# Patient Record
Sex: Male | Born: 1950 | Race: White | Hispanic: No | Marital: Married | Smoking: Former smoker
Health system: Southern US, Community
[De-identification: ages and names within clinical notes are randomized; demographics above are authoritative.]

## PROBLEM LIST (undated history)

## (undated) ENCOUNTER — Inpatient Hospital Stay: Admission: EM | Payer: Self-pay | Source: Home / Self Care

## (undated) DIAGNOSIS — I219 Acute myocardial infarction, unspecified: Secondary | ICD-10-CM

## (undated) DIAGNOSIS — I2510 Atherosclerotic heart disease of native coronary artery without angina pectoris: Secondary | ICD-10-CM

## (undated) DIAGNOSIS — Z9484 Stem cells transplant status: Secondary | ICD-10-CM

## (undated) DIAGNOSIS — K219 Gastro-esophageal reflux disease without esophagitis: Secondary | ICD-10-CM

## (undated) DIAGNOSIS — Z923 Personal history of irradiation: Secondary | ICD-10-CM

## (undated) DIAGNOSIS — I7090 Unspecified atherosclerosis: Secondary | ICD-10-CM

## (undated) DIAGNOSIS — E785 Hyperlipidemia, unspecified: Secondary | ICD-10-CM

## (undated) DIAGNOSIS — I10 Essential (primary) hypertension: Secondary | ICD-10-CM

## (undated) DIAGNOSIS — Z8701 Personal history of pneumonia (recurrent): Secondary | ICD-10-CM

## (undated) HISTORY — PX: TONSILLECTOMY: SUR1361

## (undated) HISTORY — PX: CARDIAC CATHETERIZATION: SHX172

---

## 2014-07-04 DIAGNOSIS — R1310 Dysphagia, unspecified: Secondary | ICD-10-CM | POA: Insufficient documentation

## 2014-07-04 DIAGNOSIS — S86119A Strain of other muscle(s) and tendon(s) of posterior muscle group at lower leg level, unspecified leg, initial encounter: Secondary | ICD-10-CM | POA: Insufficient documentation

## 2014-07-04 DIAGNOSIS — W57XXXA Bitten or stung by nonvenomous insect and other nonvenomous arthropods, initial encounter: Secondary | ICD-10-CM | POA: Insufficient documentation

## 2014-07-04 DIAGNOSIS — R7309 Other abnormal glucose: Secondary | ICD-10-CM | POA: Insufficient documentation

## 2014-07-04 DIAGNOSIS — E785 Hyperlipidemia, unspecified: Secondary | ICD-10-CM | POA: Insufficient documentation

## 2014-07-04 DIAGNOSIS — I2510 Atherosclerotic heart disease of native coronary artery without angina pectoris: Secondary | ICD-10-CM | POA: Insufficient documentation

## 2014-07-04 DIAGNOSIS — M7710 Lateral epicondylitis, unspecified elbow: Secondary | ICD-10-CM | POA: Insufficient documentation

## 2016-05-17 DIAGNOSIS — M19019 Primary osteoarthritis, unspecified shoulder: Secondary | ICD-10-CM | POA: Insufficient documentation

## 2016-05-17 DIAGNOSIS — M25819 Other specified joint disorders, unspecified shoulder: Secondary | ICD-10-CM | POA: Insufficient documentation

## 2017-07-11 DIAGNOSIS — M67911 Unspecified disorder of synovium and tendon, right shoulder: Secondary | ICD-10-CM | POA: Insufficient documentation

## 2020-06-08 DIAGNOSIS — M4802 Spinal stenosis, cervical region: Secondary | ICD-10-CM | POA: Insufficient documentation

## 2020-06-09 ENCOUNTER — Other Ambulatory Visit: Payer: Self-pay | Admitting: Neurosurgery

## 2020-06-10 ENCOUNTER — Other Ambulatory Visit: Payer: Self-pay | Admitting: Neurosurgery

## 2020-06-12 NOTE — Progress Notes (Signed)
[place] - [address] [place] [address] Phone: [phone] Fax: [phone]      Your procedure is scheduled on Wednesday, August 18th.  Report to [facility]tr[facility] "A" at 1:30 P.M., and check in at the Admitting office.  Call this number if you have problems the morning of surgery:  [phone]  Call [phone] if you have any questions prior to your surgery date Monday-Friday 8am-4pm    Remember:  Do not eat after midnight the night before your surgery  You may drink clear liquids until 12:30 the afternoon of your surgery.   Clear liquids allowed are: Water, Non-Citrus Juices (without pulp), Carbonated Beverages, Clear Tea, Black Coffee Only, and Gatorade    Take these medicines the morning of surgery with A SIP OF WATER   Atorvastatin (Lipitor)  Metoprolol   Nitroglycerin - if needed  Pregabalin (Lyrica)  Follow your surgeon's instructions on when to stop Prasugrel (Effient) & Aspirin.  If no instructions were given by your surgeon then you will need to call the office to get those instructions.    As of today, STOP taking Aleve, Naproxen, Ibuprofen, Motrin, Advil, Goody's, BC's, all herbal medications, fish oil, and all vitamins.                      Do not wear jewelry            Do not wear lotions, powders, colognes, or deodorant.            Men may shave face and neck.            Do not bring valuables to the hospital.            [facility] is not responsible for any belongings or valuables.  Do NOT Smoke (Tobacco/Vaping) or drink Alcohol 24 hours prior to your procedure If you use a CPAP at night, you may bring all equipment for your overnight stay.   Contacts, glasses, dentures or bridgework may not be worn into surgery.      For patients admitted to the hospital, discharge time will be determined by your treatment team.   Patients discharged the day of surgery will not be allowed to drive  home, and someone needs to stay with them for 24 hours.    Special instructions:   [facility]- Preparing For Surgery  Before surgery, you can play an important role. Because skin is not sterile, your skin needs to be as free of germs as possible. You can reduce the number of germs on your skin by washing with CHG (chlorahexidine gluconate) Soap before surgery.  CHG is an antiseptic cleaner which kills germs and bonds with the skin to continue killing germs even after washing.    Oral Hygiene is also important to reduce your risk of infection.  Remember - BRUSH YOUR TEETH THE MORNING OF SURGERY WITH YOUR REGULAR TOOTHPASTE  Please do not use if you have an allergy to CHG or antibacterial soaps. If your skin becomes reddened/irritated stop using the CHG.  Do not shave (including legs and underarms) for at least 48 hours prior to first CHG shower. It is OK to shave your face.  Please follow these instructions carefully.   1. Shower the NIGHT BEFORE SURGERY and the MORNING OF SURGERY with CHG Soap.   2. If you chose to wash your hair, wash your hair first as usual with your  normal shampoo.  3. After you shampoo, rinse your hair and body thoroughly to remove the shampoo.  4. Use CHG as you would any other liquid soap. You can apply CHG directly to the skin and wash gently with a scrungie or a clean washcloth.   5. Apply the CHG Soap to your body ONLY FROM THE NECK DOWN.  Do not use on open wounds or open sores. Avoid contact with your eyes, ears, mouth and genitals (private parts). Wash Face and genitals (private parts)  with your normal soap.   6. Wash thoroughly, paying special attention to the area where your surgery will be performed.  7. Thoroughly rinse your body with warm water from the neck down.  8. DO NOT shower/wash with your normal soap after using and rinsing off the CHG Soap.  9. Pat yourself dry with a CLEAN TOWEL.  10. Wear CLEAN PAJAMAS to bed the night before  surgery  11. Place CLEAN SHEETS on your bed the night of your first shower and DO NOT SLEEP WITH PETS.   Day of Surgery: Wear Clean/Comfortable clothing the morning of surgery Do not apply any deodorants/lotions.   Remember to brush your teeth WITH YOUR REGULAR TOOTHPASTE.   Please read over the following fact sheets that you were given.

## 2020-06-15 ENCOUNTER — Other Ambulatory Visit: Payer: Self-pay

## 2020-06-15 ENCOUNTER — Other Ambulatory Visit (HOSPITAL_COMMUNITY)
Admission: RE | Admit: 2020-06-15 | Discharge: 2020-06-15 | Disposition: A | Discharge status: Home / Self Care | Payer: BC Managed Care – PPO | Source: Ambulatory Visit | Attending: Neurosurgery | Admitting: Neurosurgery

## 2020-06-15 ENCOUNTER — Encounter (HOSPITAL_COMMUNITY): Payer: Self-pay

## 2020-06-15 ENCOUNTER — Encounter (HOSPITAL_COMMUNITY)
Admission: RE | Admit: 2020-06-15 | Discharge: 2020-06-15 | Disposition: A | Discharge status: Home / Self Care | Payer: BC Managed Care – PPO | Source: Ambulatory Visit | Attending: Neurosurgery | Admitting: Neurosurgery

## 2020-06-15 DIAGNOSIS — Z20822 Contact with and (suspected) exposure to covid-19: Secondary | ICD-10-CM | POA: Insufficient documentation

## 2020-06-15 DIAGNOSIS — I252 Old myocardial infarction: Secondary | ICD-10-CM | POA: Diagnosis not present

## 2020-06-15 DIAGNOSIS — M4722 Other spondylosis with radiculopathy, cervical region: Secondary | ICD-10-CM | POA: Diagnosis present

## 2020-06-15 DIAGNOSIS — Z87891 Personal history of nicotine dependence: Secondary | ICD-10-CM | POA: Diagnosis not present

## 2020-06-15 DIAGNOSIS — M4712 Other spondylosis with myelopathy, cervical region: Secondary | ICD-10-CM | POA: Diagnosis present

## 2020-06-15 DIAGNOSIS — M50022 Cervical disc disorder at C5-C6 level with myelopathy: Secondary | ICD-10-CM | POA: Diagnosis not present

## 2020-06-15 DIAGNOSIS — R001 Bradycardia, unspecified: Secondary | ICD-10-CM | POA: Insufficient documentation

## 2020-06-15 DIAGNOSIS — Z6835 Body mass index (BMI) 35.0-35.9, adult: Secondary | ICD-10-CM | POA: Diagnosis not present

## 2020-06-15 DIAGNOSIS — Z7982 Long term (current) use of aspirin: Secondary | ICD-10-CM | POA: Diagnosis not present

## 2020-06-15 DIAGNOSIS — Z01810 Encounter for preprocedural cardiovascular examination: Secondary | ICD-10-CM | POA: Insufficient documentation

## 2020-06-15 DIAGNOSIS — E669 Obesity, unspecified: Secondary | ICD-10-CM | POA: Diagnosis not present

## 2020-06-15 DIAGNOSIS — Z79899 Other long term (current) drug therapy: Secondary | ICD-10-CM | POA: Diagnosis not present

## 2020-06-15 DIAGNOSIS — M50122 Cervical disc disorder at C5-C6 level with radiculopathy: Secondary | ICD-10-CM | POA: Diagnosis not present

## 2020-06-15 DIAGNOSIS — M4802 Spinal stenosis, cervical region: Secondary | ICD-10-CM | POA: Diagnosis not present

## 2020-06-15 DIAGNOSIS — Z01812 Encounter for preprocedural laboratory examination: Secondary | ICD-10-CM | POA: Insufficient documentation

## 2020-06-15 DIAGNOSIS — I10 Essential (primary) hypertension: Secondary | ICD-10-CM | POA: Diagnosis not present

## 2020-06-15 DIAGNOSIS — Z7902 Long term (current) use of antithrombotics/antiplatelets: Secondary | ICD-10-CM | POA: Diagnosis not present

## 2020-06-15 DIAGNOSIS — Z955 Presence of coronary angioplasty implant and graft: Secondary | ICD-10-CM | POA: Diagnosis not present

## 2020-06-15 DIAGNOSIS — E785 Hyperlipidemia, unspecified: Secondary | ICD-10-CM | POA: Diagnosis not present

## 2020-06-15 HISTORY — DX: Unspecified atherosclerosis: I70.90

## 2020-06-15 HISTORY — DX: Essential (primary) hypertension: I10

## 2020-06-15 HISTORY — DX: Hyperlipidemia, unspecified: E78.5

## 2020-06-15 HISTORY — DX: Acute myocardial infarction, unspecified: I21.9

## 2020-06-15 LAB — CBC
HCT: 48.3 % (ref 39.0–52.0)
Hemoglobin: 15.4 g/dL (ref 13.0–17.0)
MCH: 28.2 pg (ref 26.0–34.0)
MCHC: 31.9 g/dL (ref 30.0–36.0)
MCV: 88.5 fL (ref 80.0–100.0)
Platelets: 255 10*3/uL (ref 150–400)
RBC: 5.46 MIL/uL (ref 4.22–5.81)
RDW: 13.2 % (ref 11.5–15.5)
WBC: 11.9 10*3/uL — ABNORMAL HIGH (ref 4.0–10.5)
nRBC: 0 % (ref 0.0–0.2)

## 2020-06-15 LAB — TYPE AND SCREEN
ABO/RH(D): O POS
Antibody Screen: NEGATIVE

## 2020-06-15 LAB — SURGICAL PCR SCREEN
MRSA, PCR: POSITIVE — AB
Staphylococcus aureus: POSITIVE — AB

## 2020-06-15 LAB — BASIC METABOLIC PANEL
Anion gap: 9 (ref 5–15)
BUN: 9 mg/dL (ref 8–23)
CO2: 22 mmol/L (ref 22–32)
Calcium: 10.2 mg/dL (ref 8.9–10.3)
Chloride: 109 mmol/L (ref 98–111)
Creatinine, Ser: 1.04 mg/dL (ref 0.61–1.24)
GFR calc Af Amer: >60 mL/min (ref >60)
GFR calc non Af Amer: >60 mL/min (ref >60)
Glucose, Bld: 124 mg/dL — ABNORMAL HIGH (ref 70–99)
Potassium: 4.2 mmol/L (ref 3.5–5.1)
Sodium: 140 mmol/L (ref 135–145)

## 2020-06-15 LAB — SARS CORONAVIRUS 2 (TAT 6-24 HRS): SARS Coronavirus 2: NEGATIVE

## 2020-06-15 NOTE — Progress Notes (Signed)
Patient positive for MRSA and MSSA. Spoke with Wells Guiles at Dr. Arnoldo Morale office- will inform MD of results.

## 2020-06-15 NOTE — Progress Notes (Signed)
PCP - Dr. Gwenyth Allegra Cardiologist - Dominic Pea, PA-C [facility])  Cardiac clearance in chart 06/09/20  Chest x-ray - N/A EKG - 06/15/20 Stress Test - pt states has had in past- records requested ECHO - pt states has had in past- records requested Cardiac Cath - 2000;2017   Sleep Study - denies   Blood Thinner Instructions: pt's last dose of Effient 06/11/20 Aspirin Instructions: last dose 06/11/20; Patient instructed to hold all NSAID's, herbal medications, fish oil and vitamins 7 days prior to surgery.   ERAS Protcol - clear liquids until 1230 PM DOS PRE-SURGERY Ensure or G2- N/A  COVID TEST- 06/15/20 at at [place]. Pt instructed to remain in their car. Educated on Transport planner until [employer].    Anesthesia review: cardiac history- records requested.   Patient denies shortness of breath, fever, cough and chest pain at PAT appointment   All instructions explained to the patient, with a verbal understanding of the material. Patient agrees to go over the instructions while at home for a better understanding. Patient also instructed to self quarantine after being tested for COVID-19. The opportunity to ask questions was provided.

## 2020-06-16 MED ORDER — VANCOMYCIN HCL 1500 MG/300ML IV SOLN
1500.0000 mg | INTRAVENOUS | Status: AC
  Administered 2020-06-17: 1500 mg via INTRAVENOUS
  Filled 2020-06-16: qty 300

## 2020-06-16 NOTE — Progress Notes (Signed)
Anesthesia Chart Review:  Follows with cardiology at [facility] for hx of MI with subsequent stent to LAD and RCA in 2013 and sent to [facility] in 2017 (2017 cath showed stable 50% LAD restenosis, patent RCA), HTN, HLD. Last seen 11/12/19, per note, "He remains stable at this time with no chest pain or other anginal symptoms. We discussed the possibility of discontinuing his Effient considering it has been approximately 4 years since last stent placement. Utilizing shared decision making, he will continue on his current DAPT for now along with atorvastatin, lisinopril, and metoprolol." He was advised to followup in 1 year.   Clearance from PCP dated 06/11/2020 states patient low risk from medical standpoint and will need cardiology clearance.  Cardiac clearance states patient is low to moderate risk and can hold aspirin and Effient starting 8/13.  Copy on chart.  Preop labs reviewed, unremarkable.  EKG 06/15/2020: Sinus bradycardia.  Rate 51.  Cath and PCI 05/09/16 (care everywhere): Diagnostic Summary  RCA and LAD stents patent.  50% in LAD stent narrowing as before.  Ostial Cx 50% as before.  PDA has mid 90% stenosis.  Normal LV function. EF 70%  Diagnostic Recommendations  PCI recommended after discussion with patient.  Interventional Summary  2.25x103mm resolute to PDA.  Interventional Recommendations  Dual anti-platelet therapy with Ticagrelor and Aspirin 81 mg for at least 12  months is recommended.   Wynonia Musty [facility] Short Stay Center/Anesthesiology Phone [phone] 06/16/2020 1:01 PM

## 2020-06-16 NOTE — Anesthesia Preprocedure Evaluation (Addendum)
Anesthesia Evaluation  Patient identified by MRN, date of birth, ID band Patient awake    Reviewed: Allergy & Precautions, NPO status , Patient's Chart, lab work & pertinent test results, reviewed documented beta blocker date and time   Airway Mallampati: III  TM Distance: >3 FB Neck ROM: Full    Dental  (+) Edentulous Lower, Edentulous Upper   Pulmonary former smoker,    Pulmonary exam normal breath sounds clear to auscultation       Cardiovascular hypertension, Pt. on home beta blockers and Pt. on medications + Past MI and + Cardiac Stents (LAD, RCA)  Normal cardiovascular exam Rhythm:Regular Rate:Normal     Neuro/Psych CERVICAL SPONDYLOSIS WITH MYELOPATHY AMD RADICULOPATHY negative psych ROS   GI/Hepatic negative GI ROS, Neg liver ROS,   Endo/Other  Obesity   Renal/GU negative Renal ROS     Musculoskeletal negative musculoskeletal ROS (+)   Abdominal   Peds  Hematology  (+) Blood dyscrasia (Effient), ,   Anesthesia Other Findings Day of surgery medications reviewed with the patient.  Reproductive/Obstetrics                           Anesthesia Physical Anesthesia Plan  ASA: III  Anesthesia Plan: General   Post-op Pain Management:    Induction: Intravenous  PONV Risk Score and Plan: 2 and Dexamethasone and Ondansetron  Airway Management Planned: Oral ETT and Video Laryngoscope Planned  Additional Equipment:   Intra-op Plan:   Post-operative Plan: Extubation in OR  Informed Consent: I have reviewed the patients History and Physical, chart, labs and discussed the procedure including the risks, benefits and alternatives for the proposed anesthesia with the patient or authorized representative who has indicated his/her understanding and acceptance.       Plan Discussed with: CRNA  Anesthesia Plan Comments: (PAT note by Karoline Caldwell, PA-C: Follows with cardiology at [facility]  for hx of MI with subsequent stent to LAD and RCA in 2013 and sent to [facility] in 2017 (2017 cath showed stable 50% LAD restenosis, patent RCA), HTN, HLD. Last seen 11/12/19, per note, "He remains stable at this time with no chest pain or other anginal symptoms. We discussed the possibility of discontinuing his Effient considering it has been approximately 4 years since last stent placement. Utilizing shared decision making, he will continue on his current DAPT for now along with atorvastatin, lisinopril, and metoprolol." He was advised to followup in 1 year.   Clearance from PCP dated 06/11/2020 states patient low risk from medical standpoint and will need cardiology clearance.  Cardiac clearance states patient is low to moderate risk and can hold aspirin and Effient starting 8/13.  Copy on chart.  Preop labs reviewed, unremarkable.  EKG 06/15/2020: Sinus bradycardia.  Rate 51.  Cath and PCI 05/09/16 (care everywhere): Diagnostic Summary  RCA and LAD stents patent.  50% in LAD stent narrowing as before.  Ostial Cx 50% as before.  PDA has mid 90% stenosis.  Normal LV function. EF 70%  Diagnostic Recommendations  PCI recommended after discussion with patient.  Interventional Summary  2.25x70mm resolute to PDA.  Interventional Recommendations  Dual anti-platelet therapy with Ticagrelor and Aspirin 81 mg for at least 12  months is recommended.   )      Anesthesia Quick Evaluation

## 2020-06-17 ENCOUNTER — Encounter (HOSPITAL_COMMUNITY): Payer: Self-pay | Admitting: Neurosurgery

## 2020-06-17 ENCOUNTER — Ambulatory Visit (HOSPITAL_COMMUNITY): Payer: BC Managed Care – PPO | Admitting: Physician Assistant

## 2020-06-17 ENCOUNTER — Other Ambulatory Visit: Payer: Self-pay

## 2020-06-17 ENCOUNTER — Ambulatory Visit (HOSPITAL_COMMUNITY): Payer: BC Managed Care – PPO

## 2020-06-17 ENCOUNTER — Ambulatory Visit (HOSPITAL_COMMUNITY): Payer: BC Managed Care – PPO | Admitting: Anesthesiology

## 2020-06-17 ENCOUNTER — Ambulatory Visit (HOSPITAL_COMMUNITY)
Admission: RE | Admit: 2020-06-17 | Discharge: 2020-06-18 | Disposition: A | Discharge status: Home / Self Care | Payer: BC Managed Care – PPO | Attending: Neurosurgery | Admitting: Neurosurgery

## 2020-06-17 ENCOUNTER — Encounter (HOSPITAL_COMMUNITY)
Admission: RE | Disposition: A | Discharge status: Home / Self Care | Payer: Self-pay | Source: Home / Self Care | Attending: Neurosurgery

## 2020-06-17 DIAGNOSIS — E669 Obesity, unspecified: Secondary | ICD-10-CM | POA: Diagnosis not present

## 2020-06-17 DIAGNOSIS — Z6835 Body mass index (BMI) 35.0-35.9, adult: Secondary | ICD-10-CM | POA: Insufficient documentation

## 2020-06-17 DIAGNOSIS — I252 Old myocardial infarction: Secondary | ICD-10-CM | POA: Diagnosis not present

## 2020-06-17 DIAGNOSIS — Z7902 Long term (current) use of antithrombotics/antiplatelets: Secondary | ICD-10-CM | POA: Insufficient documentation

## 2020-06-17 DIAGNOSIS — M50022 Cervical disc disorder at C5-C6 level with myelopathy: Secondary | ICD-10-CM | POA: Insufficient documentation

## 2020-06-17 DIAGNOSIS — M50122 Cervical disc disorder at C5-C6 level with radiculopathy: Secondary | ICD-10-CM | POA: Insufficient documentation

## 2020-06-17 DIAGNOSIS — Z7982 Long term (current) use of aspirin: Secondary | ICD-10-CM | POA: Diagnosis not present

## 2020-06-17 DIAGNOSIS — M4802 Spinal stenosis, cervical region: Secondary | ICD-10-CM | POA: Diagnosis not present

## 2020-06-17 DIAGNOSIS — Z955 Presence of coronary angioplasty implant and graft: Secondary | ICD-10-CM | POA: Diagnosis not present

## 2020-06-17 DIAGNOSIS — Z20822 Contact with and (suspected) exposure to covid-19: Secondary | ICD-10-CM | POA: Insufficient documentation

## 2020-06-17 DIAGNOSIS — I10 Essential (primary) hypertension: Secondary | ICD-10-CM | POA: Diagnosis not present

## 2020-06-17 DIAGNOSIS — M4712 Other spondylosis with myelopathy, cervical region: Secondary | ICD-10-CM | POA: Diagnosis present

## 2020-06-17 DIAGNOSIS — Z419 Encounter for procedure for purposes other than remedying health state, unspecified: Secondary | ICD-10-CM

## 2020-06-17 DIAGNOSIS — Z87891 Personal history of nicotine dependence: Secondary | ICD-10-CM | POA: Insufficient documentation

## 2020-06-17 DIAGNOSIS — E785 Hyperlipidemia, unspecified: Secondary | ICD-10-CM | POA: Insufficient documentation

## 2020-06-17 DIAGNOSIS — M4722 Other spondylosis with radiculopathy, cervical region: Secondary | ICD-10-CM | POA: Insufficient documentation

## 2020-06-17 DIAGNOSIS — Z79899 Other long term (current) drug therapy: Secondary | ICD-10-CM | POA: Insufficient documentation

## 2020-06-17 HISTORY — PX: ANTERIOR CERVICAL DECOMP/DISCECTOMY FUSION: SHX1161

## 2020-06-17 LAB — ABO/RH: ABO/RH(D): O POS

## 2020-06-17 IMAGING — CR DG CERVICAL SPINE 2 OR 3 VIEWS
3 series · 3 of 3 positions shown · non-contrast
Comparison: MRI cervical spine [DATE]

CLINICAL DATA: Intraoperative localization for spine surgery.

EXAM:
CERVICAL SPINE - 2-3 VIEW

[xtable lateral (1 of 3)]
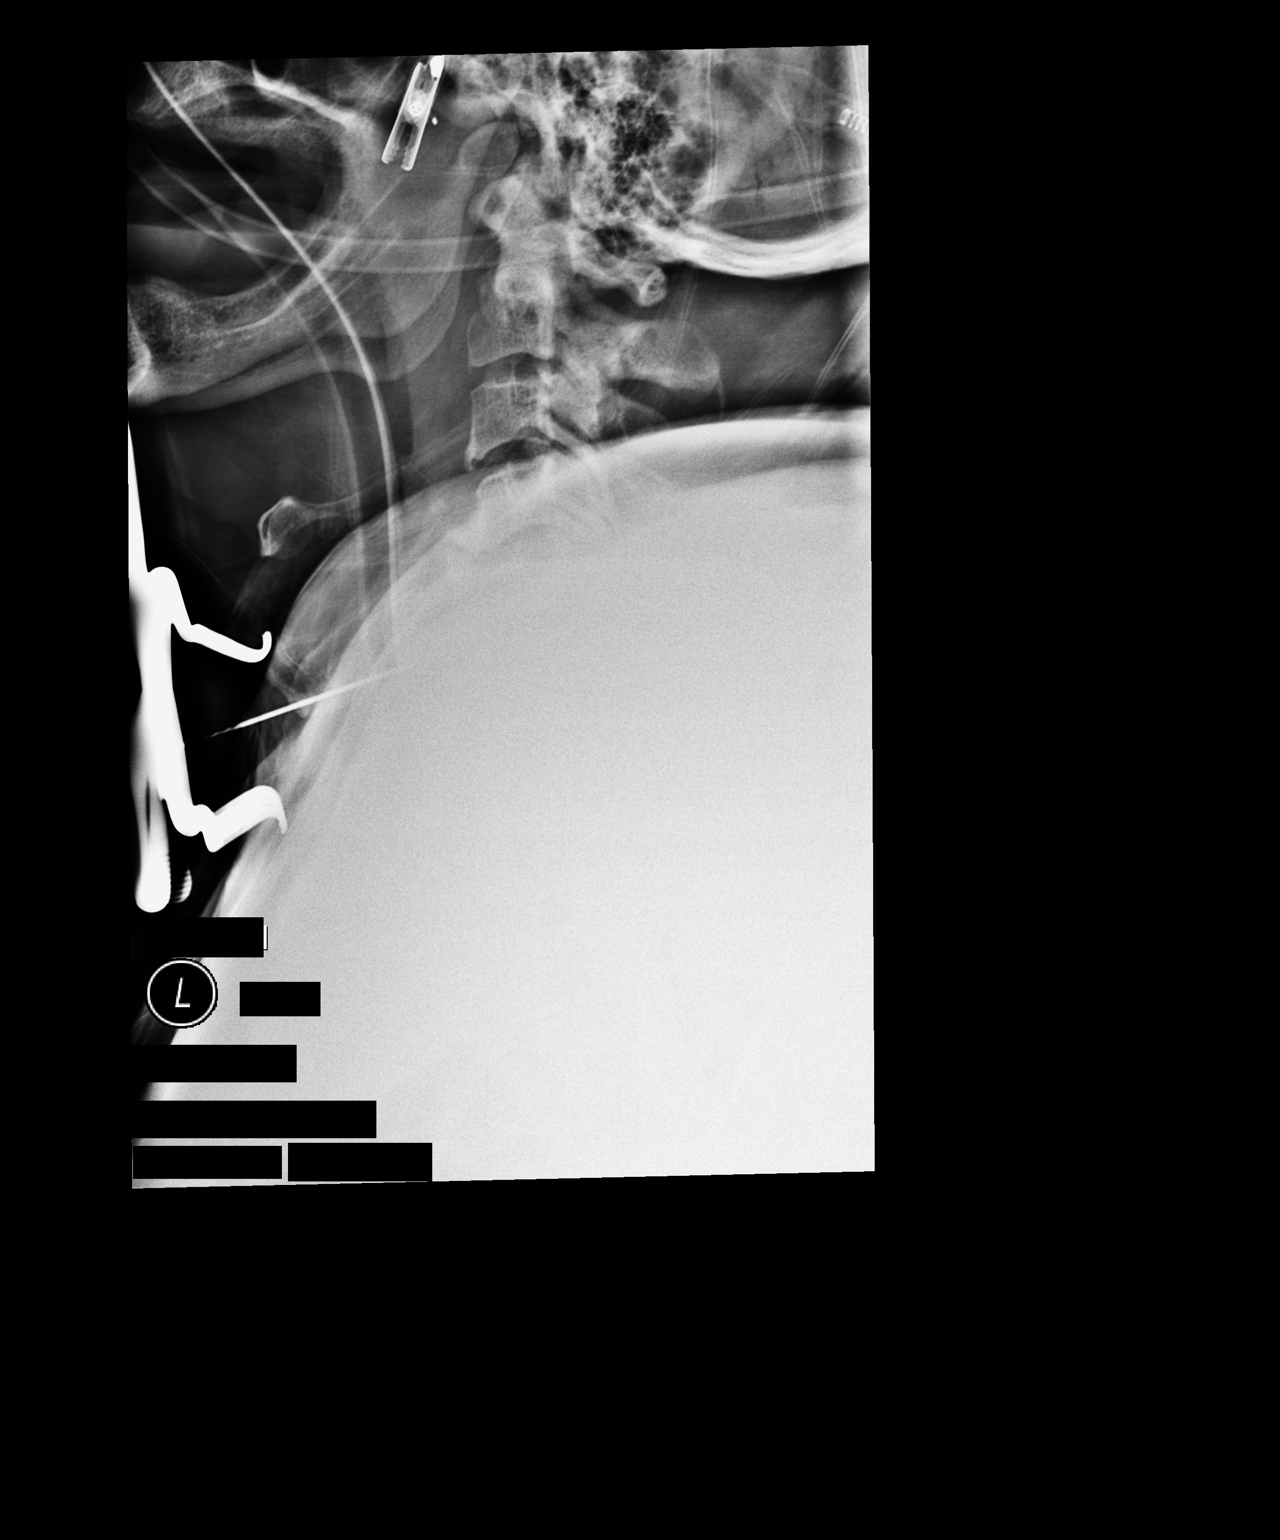

[xtable lateral (2 of 3)]
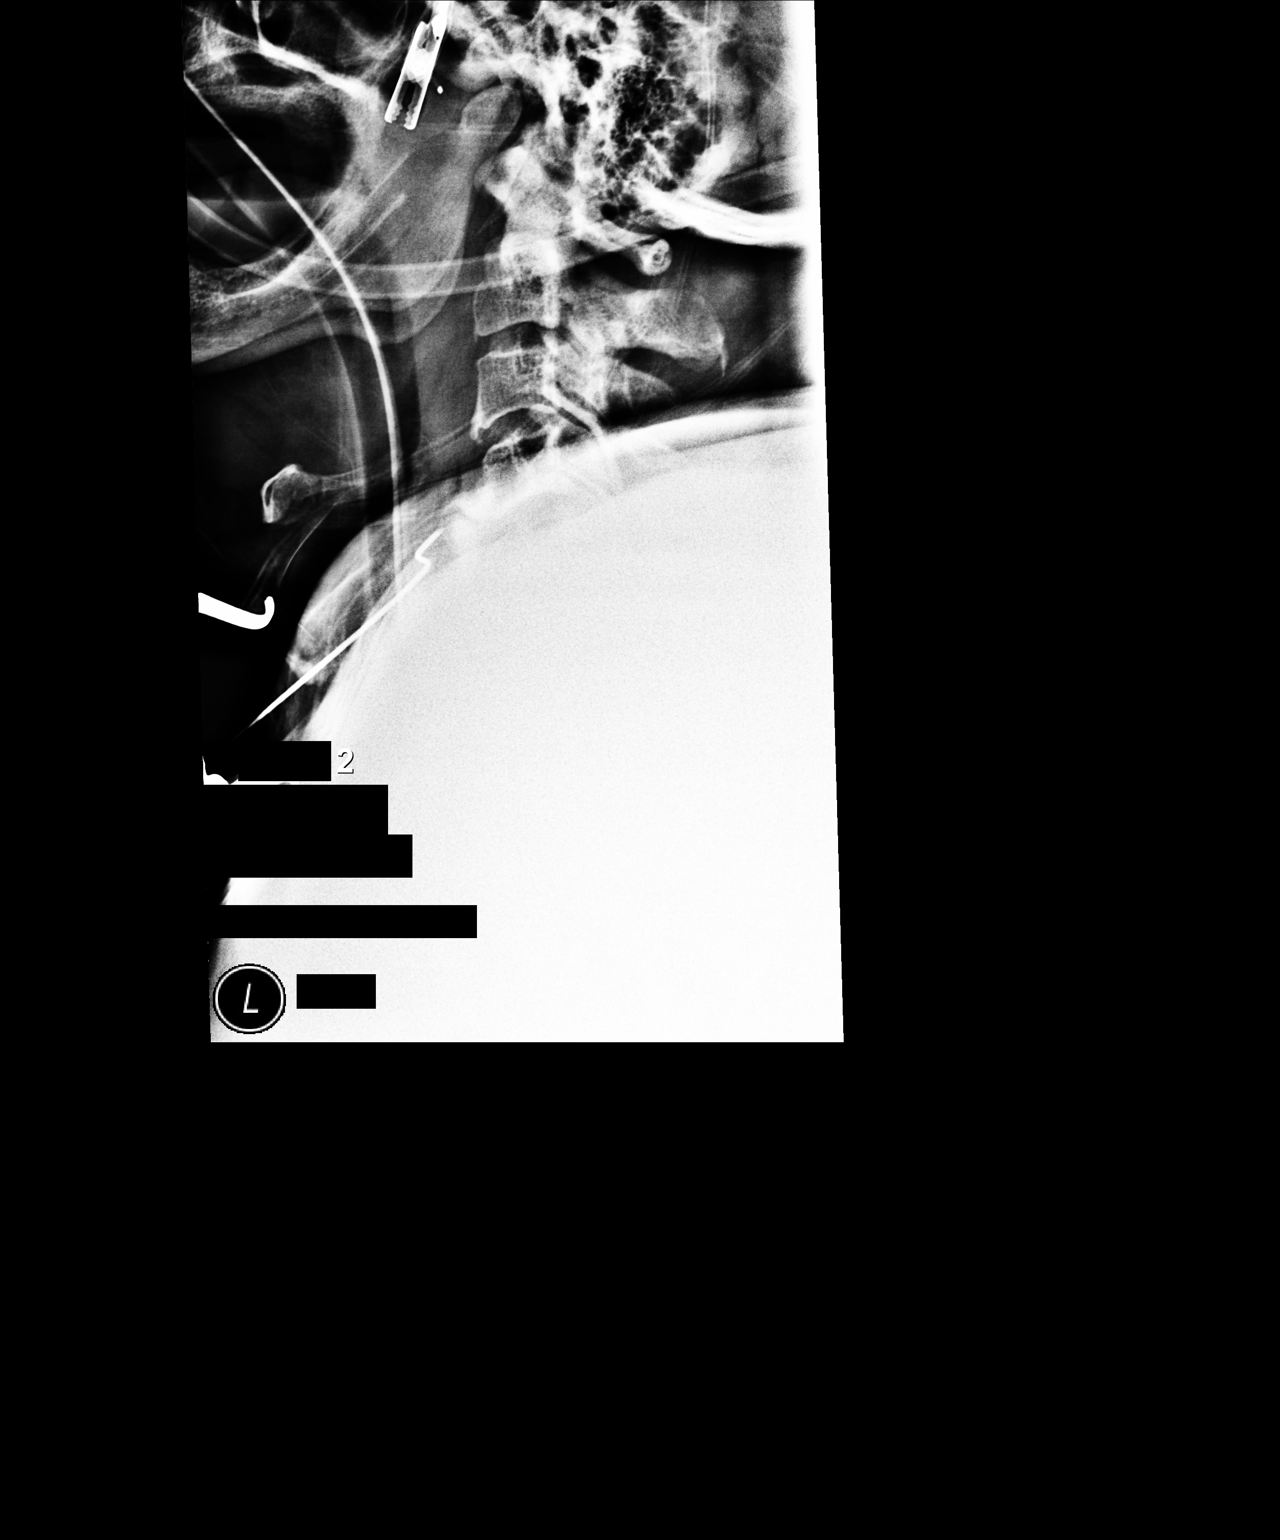

[xtable lateral (3 of 3)]
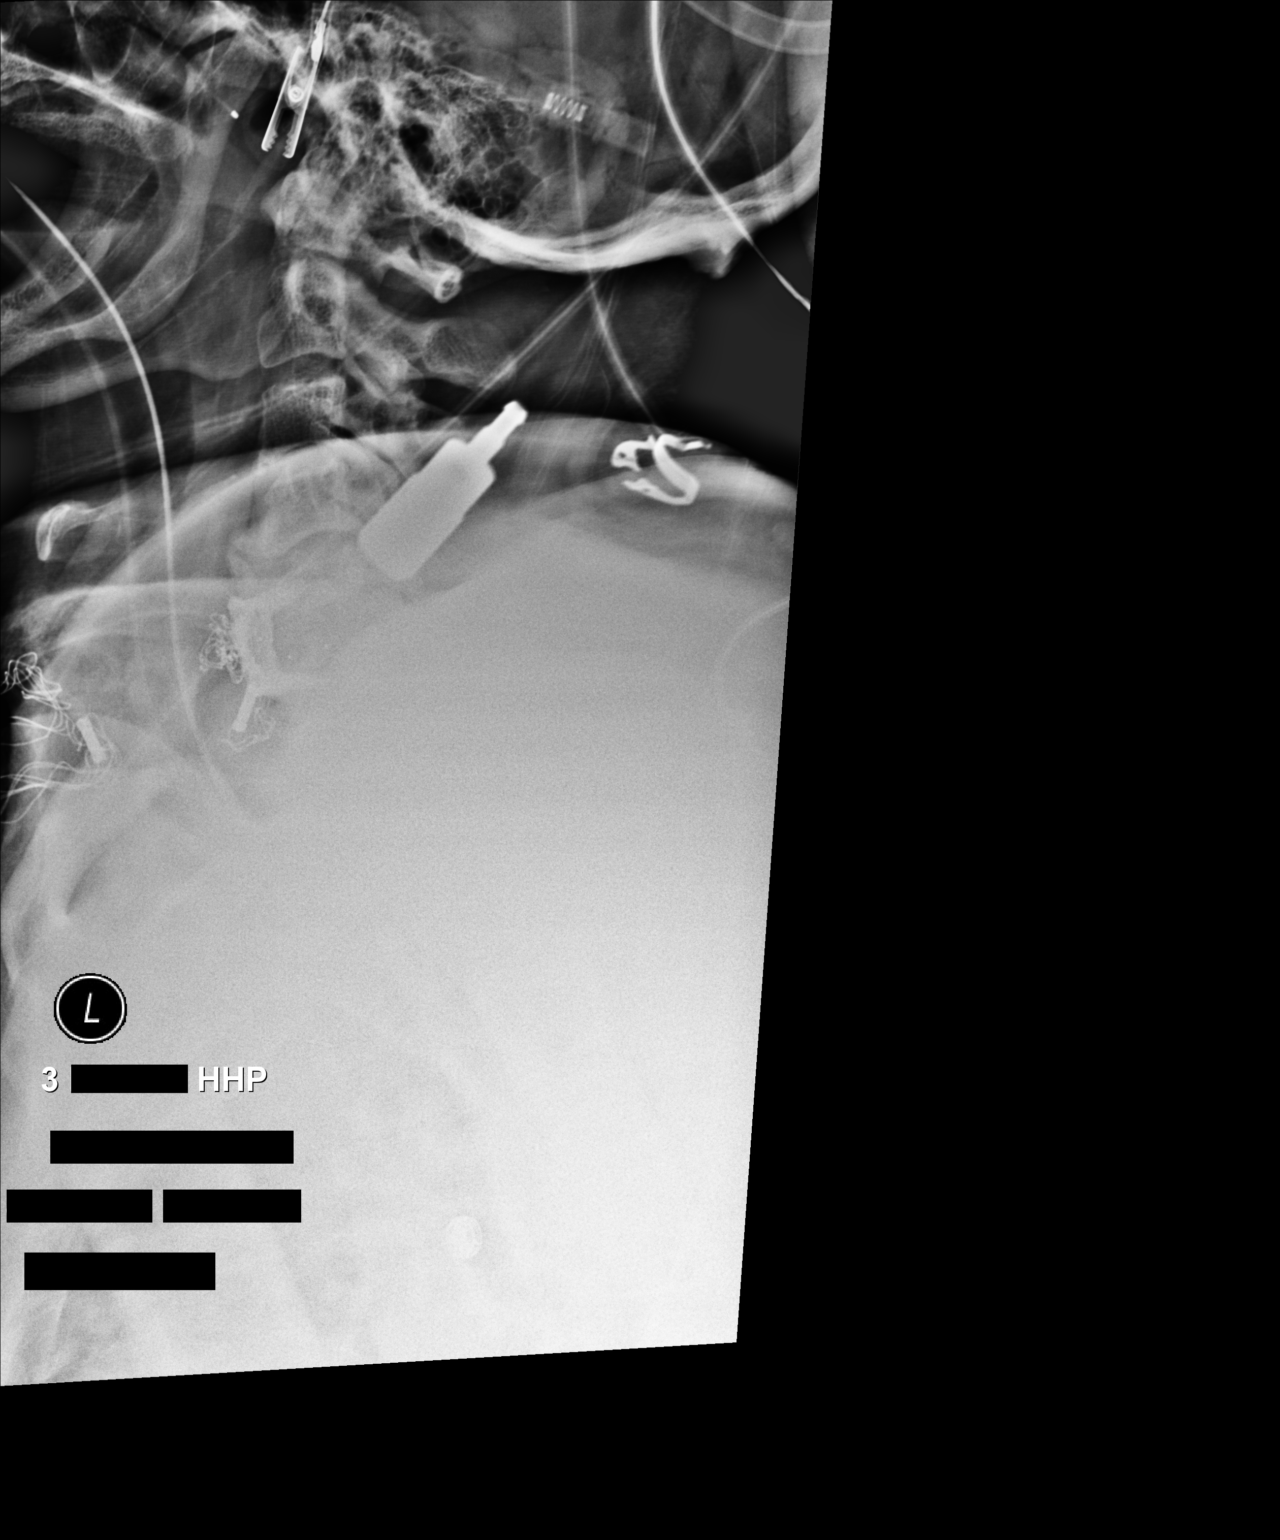

[3 of 3 positions shown; findings below may reference images not displayed]

FINDINGS: Lateral spot films from the OR demonstrate a spinal needle on film 1
located at the C5-6 level. The second film shows a spinal needle at
the C4-5 level.
IMPRESSION: C4-5 and C5-6 localized.

## 2020-06-17 SURGERY — ANTERIOR CERVICAL DECOMPRESSION/DISCECTOMY FUSION 1 LEVEL
Anesthesia: General

## 2020-06-17 MED ORDER — CYCLOBENZAPRINE HCL 10 MG PO TABS
ORAL_TABLET | ORAL | Status: AC
  Filled 2020-06-17: qty 1

## 2020-06-17 MED ORDER — CHLORHEXIDINE GLUCONATE CLOTH 2 % EX PADS: 6.0000 | MEDICATED_PAD | Freq: Once | CUTANEOUS | Status: DC

## 2020-06-17 MED ORDER — DEXAMETHASONE SODIUM PHOSPHATE 10 MG/ML IJ SOLN
INTRAMUSCULAR | Status: DC | PRN
  Administered 2020-06-17: 10 mg via INTRAVENOUS

## 2020-06-17 MED ORDER — FENTANYL CITRATE (PF) 100 MCG/2ML IJ SOLN: 25.0000 ug | INTRAMUSCULAR | Status: DC | PRN

## 2020-06-17 MED ORDER — LACTATED RINGERS IV SOLN: INTRAVENOUS | Status: DC

## 2020-06-17 MED ORDER — ONDANSETRON HCL 4 MG PO TABS: 4.0000 mg | ORAL_TABLET | Freq: Four times a day (QID) | ORAL | Status: DC | PRN

## 2020-06-17 MED ORDER — THROMBIN 5000 UNITS EX SOLR
CUTANEOUS | Status: AC
  Filled 2020-06-17: qty 5000

## 2020-06-17 MED ORDER — DOCUSATE SODIUM 100 MG PO CAPS
100.0000 mg | ORAL_CAPSULE | Freq: Two times a day (BID) | ORAL | Status: DC
  Administered 2020-06-17: 100 mg via ORAL
  Filled 2020-06-17: qty 1

## 2020-06-17 MED ORDER — BACITRACIN ZINC 500 UNIT/GM EX OINT
TOPICAL_OINTMENT | CUTANEOUS | Status: AC
  Filled 2020-06-17: qty 28.35

## 2020-06-17 MED ORDER — ALUM & MAG HYDROXIDE-SIMETH 200-200-20 MG/5ML PO SUSP: 30.0000 mL | Freq: Four times a day (QID) | ORAL | Status: DC | PRN

## 2020-06-17 MED ORDER — OXYCODONE HCL 5 MG PO TABS: 5.0000 mg | ORAL_TABLET | ORAL | Status: DC | PRN

## 2020-06-17 MED ORDER — ACETAMINOPHEN 500 MG PO TABS
1000.0000 mg | ORAL_TABLET | Freq: Four times a day (QID) | ORAL | Status: DC
  Administered 2020-06-17 – 2020-06-18 (×2): 1000 mg via ORAL
  Filled 2020-06-17 (×2): qty 2

## 2020-06-17 MED ORDER — SODIUM CHLORIDE 0.9 % IV SOLN
INTRAVENOUS | Status: DC | PRN
  Administered 2020-06-17: 500 mL

## 2020-06-17 MED ORDER — ONDANSETRON HCL 4 MG/2ML IJ SOLN
INTRAMUSCULAR | Status: DC | PRN
  Administered 2020-06-17: 4 mg via INTRAVENOUS

## 2020-06-17 MED ORDER — CEFAZOLIN SODIUM-DEXTROSE 2-4 GM/100ML-% IV SOLN
INTRAVENOUS | Status: AC
  Filled 2020-06-17: qty 100

## 2020-06-17 MED ORDER — MORPHINE SULFATE (PF) 4 MG/ML IV SOLN: 4.0000 mg | INTRAVENOUS | Status: DC | PRN

## 2020-06-17 MED ORDER — ROCURONIUM BROMIDE 10 MG/ML (PF) SYRINGE
PREFILLED_SYRINGE | INTRAVENOUS | Status: AC
  Filled 2020-06-17: qty 10

## 2020-06-17 MED ORDER — BUPIVACAINE-EPINEPHRINE 0.5% -1:200000 IJ SOLN
INTRAMUSCULAR | Status: AC
  Filled 2020-06-17: qty 1

## 2020-06-17 MED ORDER — NITROGLYCERIN 0.4 MG SL SUBL: 0.4000 mg | SUBLINGUAL_TABLET | SUBLINGUAL | Status: DC | PRN

## 2020-06-17 MED ORDER — OXYCODONE HCL 5 MG PO TABS
ORAL_TABLET | ORAL | Status: AC
Start: 2020-06-17 — End: 2020-06-18
  Filled 2020-06-17: qty 2

## 2020-06-17 MED ORDER — ONDANSETRON HCL 4 MG/2ML IJ SOLN: 4.0000 mg | Freq: Four times a day (QID) | INTRAMUSCULAR | Status: DC | PRN

## 2020-06-17 MED ORDER — ACETAMINOPHEN 325 MG PO TABS
650.0000 mg | ORAL_TABLET | ORAL | Status: DC | PRN
  Administered 2020-06-18: 650 mg via ORAL
  Filled 2020-06-17: qty 2

## 2020-06-17 MED ORDER — DEXAMETHASONE SODIUM PHOSPHATE 4 MG/ML IJ SOLN
4.0000 mg | Freq: Four times a day (QID) | INTRAMUSCULAR | Status: AC
  Administered 2020-06-17 – 2020-06-18 (×2): 4 mg via INTRAVENOUS
  Filled 2020-06-17 (×2): qty 1

## 2020-06-17 MED ORDER — ROCURONIUM BROMIDE 10 MG/ML (PF) SYRINGE
PREFILLED_SYRINGE | INTRAVENOUS | Status: DC | PRN
  Administered 2020-06-17 (×3): 20 mg via INTRAVENOUS
  Administered 2020-06-17: 60 mg via INTRAVENOUS

## 2020-06-17 MED ORDER — ACETAMINOPHEN 500 MG PO TABS
ORAL_TABLET | ORAL | Status: AC
  Filled 2020-06-17: qty 2

## 2020-06-17 MED ORDER — FENTANYL CITRATE (PF) 100 MCG/2ML IJ SOLN
INTRAMUSCULAR | Status: DC | PRN
  Administered 2020-06-17: 25 ug via INTRAVENOUS
  Administered 2020-06-17: 100 ug via INTRAVENOUS
  Administered 2020-06-17: 50 ug via INTRAVENOUS

## 2020-06-17 MED ORDER — ACETAMINOPHEN 650 MG RE SUPP: 650.0000 mg | RECTAL | Status: DC | PRN

## 2020-06-17 MED ORDER — PROPOFOL 10 MG/ML IV BOLUS
INTRAVENOUS | Status: AC
  Filled 2020-06-17: qty 20

## 2020-06-17 MED ORDER — CYCLOBENZAPRINE HCL 10 MG PO TABS
10.0000 mg | ORAL_TABLET | Freq: Three times a day (TID) | ORAL | Status: DC | PRN
  Administered 2020-06-17 – 2020-06-18 (×2): 10 mg via ORAL
  Filled 2020-06-17: qty 1

## 2020-06-17 MED ORDER — ATORVASTATIN CALCIUM 10 MG PO TABS: 20.0000 mg | ORAL_TABLET | Freq: Every day | ORAL | Status: DC

## 2020-06-17 MED ORDER — LISINOPRIL 5 MG PO TABS
7.5000 mg | ORAL_TABLET | Freq: Every day | ORAL | Status: DC
  Filled 2020-06-17: qty 1

## 2020-06-17 MED ORDER — ACETAMINOPHEN 500 MG PO TABS: 1000.0000 mg | ORAL_TABLET | Freq: Once | ORAL | Status: DC

## 2020-06-17 MED ORDER — METOPROLOL SUCCINATE ER 50 MG PO TB24: 50.0000 mg | ORAL_TABLET | Freq: Every day | ORAL | Status: DC

## 2020-06-17 MED ORDER — PHENYLEPHRINE HCL (PRESSORS) 10 MG/ML IV SOLN
INTRAVENOUS | Status: DC | PRN
  Administered 2020-06-17: 120 ug via INTRAVENOUS

## 2020-06-17 MED ORDER — OXYCODONE HCL 5 MG PO TABS
10.0000 mg | ORAL_TABLET | ORAL | Status: DC | PRN
  Administered 2020-06-17 – 2020-06-18 (×4): 10 mg via ORAL
  Filled 2020-06-17 (×3): qty 2

## 2020-06-17 MED ORDER — LIDOCAINE 2% (20 MG/ML) 5 ML SYRINGE
INTRAMUSCULAR | Status: AC
  Filled 2020-06-17: qty 5

## 2020-06-17 MED ORDER — 0.9 % SODIUM CHLORIDE (POUR BTL) OPTIME
TOPICAL | Status: DC | PRN
  Administered 2020-06-17: 1000 mL

## 2020-06-17 MED ORDER — BISACODYL 10 MG RE SUPP: 10.0000 mg | Freq: Every day | RECTAL | Status: DC | PRN

## 2020-06-17 MED ORDER — CEFAZOLIN SODIUM-DEXTROSE 2-4 GM/100ML-% IV SOLN
2.0000 g | INTRAVENOUS | Status: AC
  Administered 2020-06-17: 2 g via INTRAVENOUS

## 2020-06-17 MED ORDER — PHENOL 1.4 % MT LIQD: 1.0000 | OROMUCOSAL | Status: DC | PRN

## 2020-06-17 MED ORDER — BUPIVACAINE-EPINEPHRINE (PF) 0.5% -1:200000 IJ SOLN
INTRAMUSCULAR | Status: DC | PRN
  Administered 2020-06-17: 10 mL

## 2020-06-17 MED ORDER — CHLORHEXIDINE GLUCONATE 0.12 % MT SOLN
OROMUCOSAL | Status: AC
  Administered 2020-06-17: 15 mL via OROMUCOSAL
  Filled 2020-06-17: qty 15

## 2020-06-17 MED ORDER — THROMBIN 5000 UNITS EX SOLR
OROMUCOSAL | Status: DC | PRN
  Administered 2020-06-17: 5 mL via TOPICAL

## 2020-06-17 MED ORDER — CHLORHEXIDINE GLUCONATE 0.12 % MT SOLN: 15.0000 mL | Freq: Once | OROMUCOSAL | Status: AC

## 2020-06-17 MED ORDER — BACITRACIN ZINC 500 UNIT/GM EX OINT
TOPICAL_OINTMENT | CUTANEOUS | Status: DC | PRN
  Administered 2020-06-17: 1 via TOPICAL

## 2020-06-17 MED ORDER — PROPOFOL 10 MG/ML IV BOLUS
INTRAVENOUS | Status: DC | PRN
  Administered 2020-06-17: 150 mg via INTRAVENOUS

## 2020-06-17 MED ORDER — CEFAZOLIN SODIUM-DEXTROSE 2-4 GM/100ML-% IV SOLN
2.0000 g | Freq: Three times a day (TID) | INTRAVENOUS | Status: AC
  Administered 2020-06-17 – 2020-06-18 (×2): 2 g via INTRAVENOUS
  Filled 2020-06-17 (×2): qty 100

## 2020-06-17 MED ORDER — LIDOCAINE 2% (20 MG/ML) 5 ML SYRINGE
INTRAMUSCULAR | Status: DC | PRN
  Administered 2020-06-17: 40 mg via INTRAVENOUS
  Administered 2020-06-17: 60 mg via INTRAVENOUS

## 2020-06-17 MED ORDER — ORAL CARE MOUTH RINSE: 15.0000 mL | Freq: Once | OROMUCOSAL | Status: AC

## 2020-06-17 MED ORDER — FENTANYL CITRATE (PF) 250 MCG/5ML IJ SOLN
INTRAMUSCULAR | Status: AC
  Filled 2020-06-17: qty 5

## 2020-06-17 MED ORDER — PANTOPRAZOLE SODIUM 40 MG IV SOLR
40.0000 mg | Freq: Every day | INTRAVENOUS | Status: DC
  Administered 2020-06-17: 40 mg via INTRAVENOUS
  Filled 2020-06-17: qty 40

## 2020-06-17 MED ORDER — DEXAMETHASONE 4 MG PO TABS: 4.0000 mg | ORAL_TABLET | Freq: Four times a day (QID) | ORAL | Status: AC

## 2020-06-17 MED ORDER — MENTHOL 3 MG MT LOZG
1.0000 | LOZENGE | OROMUCOSAL | Status: DC | PRN
  Filled 2020-06-17: qty 9

## 2020-06-17 MED ORDER — PHENYLEPHRINE HCL-NACL 10-0.9 MG/250ML-% IV SOLN
INTRAVENOUS | Status: DC | PRN
  Administered 2020-06-17: 25 ug/min via INTRAVENOUS

## 2020-06-17 MED ORDER — PROMETHAZINE HCL 25 MG/ML IJ SOLN: 6.2500 mg | INTRAMUSCULAR | Status: DC | PRN

## 2020-06-17 MED ORDER — PREGABALIN 75 MG PO CAPS
150.0000 mg | ORAL_CAPSULE | Freq: Two times a day (BID) | ORAL | Status: DC
  Administered 2020-06-17: 150 mg via ORAL
  Filled 2020-06-17: qty 2

## 2020-06-17 SURGICAL SUPPLY — 57 items
BAG DECANTER FOR FLEXI CONT (MISCELLANEOUS) ×3 IMPLANT
BAND RUBBER #18 3X1/16 STRL (MISCELLANEOUS) IMPLANT
BENZOIN TINCTURE PRP APPL 2/3 (GAUZE/BANDAGES/DRESSINGS) ×3 IMPLANT
BIT DRILL NEURO 2X3.1 SFT TUCH (MISCELLANEOUS) ×1 IMPLANT
BLADE SURG 15 STRL LF DISP TIS (BLADE) ×1 IMPLANT
BLADE SURG 15 STRL SS (BLADE) ×2
BLADE ULTRA TIP 2M (BLADE) ×3 IMPLANT
BUR BARREL STRAIGHT FLUTE 4.0 (BURR) ×3 IMPLANT
BUR MATCHSTICK NEURO 3.0 LAGG (BURR) ×3 IMPLANT
CANISTER SUCT 3000ML PPV (MISCELLANEOUS) ×3 IMPLANT
CARTRIDGE OIL MAESTRO DRILL (MISCELLANEOUS) ×1 IMPLANT
CLOSURE WOUND 1/2 X4 (GAUZE/BANDAGES/DRESSINGS) ×1
COVER MAYO STAND STRL (DRAPES) ×3 IMPLANT
COVER WAND RF STERILE (DRAPES) IMPLANT
DECANTER SPIKE VIAL GLASS SM (MISCELLANEOUS) ×3 IMPLANT
DEVICE FUSION VISTA 14X14X9MM (Spacer) ×1 IMPLANT
DIFFUSER DRILL AIR PNEUMATIC (MISCELLANEOUS) ×3 IMPLANT
DRAPE LAPAROTOMY 100X72 PEDS (DRAPES) ×3 IMPLANT
DRAPE MICROSCOPE LEICA (MISCELLANEOUS) IMPLANT
DRAPE SURG 17X23 STRL (DRAPES) ×6 IMPLANT
DRILL NEURO 2X3.1 SOFT TOUCH (MISCELLANEOUS) ×3
DRSG OPSITE POSTOP 4X6 (GAUZE/BANDAGES/DRESSINGS) ×3 IMPLANT
ELECT REM PT RETURN 9FT ADLT (ELECTROSURGICAL) ×3
ELECTRODE REM PT RTRN 9FT ADLT (ELECTROSURGICAL) ×1 IMPLANT
GAUZE 4X4 16PLY RFD (DISPOSABLE) IMPLANT
GAUZE SPONGE 4X4 12PLY STRL (GAUZE/BANDAGES/DRESSINGS) ×3 IMPLANT
GLOVE BIO SURGEON STRL SZ8 (GLOVE) ×3 IMPLANT
GLOVE BIO SURGEON STRL SZ8.5 (GLOVE) ×3 IMPLANT
GLOVE EXAM NITRILE XL STR (GLOVE) IMPLANT
GOWN STRL REUS W/ TWL LRG LVL3 (GOWN DISPOSABLE) IMPLANT
GOWN STRL REUS W/ TWL XL LVL3 (GOWN DISPOSABLE) ×1 IMPLANT
GOWN STRL REUS W/TWL LRG LVL3 (GOWN DISPOSABLE)
GOWN STRL REUS W/TWL XL LVL3 (GOWN DISPOSABLE) ×2
HEMOSTAT POWDER KIT SURGIFOAM (HEMOSTASIS) ×3 IMPLANT
KIT BASIN OR (CUSTOM PROCEDURE TRAY) ×3 IMPLANT
KIT TURNOVER KIT B (KITS) ×3 IMPLANT
MARKER SKIN DUAL TIP RULER LAB (MISCELLANEOUS) ×3 IMPLANT
NEEDLE HYPO 22GX1.5 SAFETY (NEEDLE) ×3 IMPLANT
NEEDLE SPNL 18GX3.5 QUINCKE PK (NEEDLE) ×3 IMPLANT
NS IRRIG 1000ML POUR BTL (IV SOLUTION) ×3 IMPLANT
OIL CARTRIDGE MAESTRO DRILL (MISCELLANEOUS) ×3
PACK LAMINECTOMY NEURO (CUSTOM PROCEDURE TRAY) ×3 IMPLANT
PIN DISTRACTION 14MM (PIN) ×6 IMPLANT
PLATE ANT CERV XTEND ELD 1 L14 (Plate) ×3 IMPLANT
PUTTY DBM 2CC CALC GRAN (Putty) ×3 IMPLANT
SCREW VAR 4.2 XD SELF DRILL 14 (Screw) ×12 IMPLANT
SCREW XTD VAR 4.2 SELF TAP (Screw) IMPLANT
SPONGE INTESTINAL PEANUT (DISPOSABLE) ×6 IMPLANT
SPONGE SURGIFOAM ABS GEL SZ50 (HEMOSTASIS) IMPLANT
STRIP CLOSURE SKIN 1/2X4 (GAUZE/BANDAGES/DRESSINGS) ×2 IMPLANT
SUT VIC AB 0 CT1 27 (SUTURE) ×2
SUT VIC AB 0 CT1 27XBRD ANTBC (SUTURE) ×1 IMPLANT
SUT VIC AB 3-0 SH 8-18 (SUTURE) ×3 IMPLANT
TOWEL GREEN STERILE (TOWEL DISPOSABLE) ×3 IMPLANT
TOWEL GREEN STERILE FF (TOWEL DISPOSABLE) ×3 IMPLANT
VISTA 14X14X9MM (Spacer) ×3 IMPLANT
WATER STERILE IRR 1000ML POUR (IV SOLUTION) ×3 IMPLANT

## 2020-06-17 NOTE — Anesthesia Procedure Notes (Signed)
Procedure Name: Intubation Date/Time: 06/17/2020 3:18 PM Performed by: Babs Bertin, CRNA Pre-anesthesia Checklist: Patient identified, Emergency Drugs available, Suction available, Patient being monitored and Timeout performed Patient Re-evaluated:Patient Re-evaluated prior to induction Oxygen Delivery Method: Circle system utilized Preoxygenation: Pre-oxygenation with 100% oxygen Induction Type: IV induction Ventilation: Two handed mask ventilation required Laryngoscope Size: Mac, Glidescope and 3 Grade View: Grade I Tube type: Oral Tube size: 7.5 mm Number of attempts: 1 Airway Equipment and Method: Video-laryngoscopy and Stylet Placement Confirmation: ETT inserted through vocal cords under direct vision,  positive ETCO2 and breath sounds checked- equal and bilateral Secured at: 22 cm Tube secured with: Tape Dental Injury: Teeth and Oropharynx as per pre-operative assessment  Comments: Placed by Hilda Blades

## 2020-06-17 NOTE — Op Note (Signed)
Brief history: The patient is a 69 year old white male who is complaining of neck and right greater left arm pain consistent with cervical radiculopathy/myelopathy.  He has failed medical management and was worked up with a cervical MRI which demonstrated significant spondylosis and stenosis at C5-6.  I discussed the various treatment options with him.  He has weighed the risks, benefits and alternatives surgery and decided proceed with a C5-6 anterior cervical discectomy, fusion and plating.  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 Earle Gell  Asst.: Arnetha Massy, NP  Anesthesia: Gen. endotracheal  Estimated blood loss: 75 cc  Drains: None  Complications: None  Description of procedure: The patient was brought to the operating room by the anesthesia team. General endotracheal anesthesia was induced. A roll was placed under the patient's shoulders to keep the neck in the neutral position. The patient's anterior cervical region was then prepared with Betadine scrub and Betadine solution. Sterile drapes were applied.  The area to be incised was then injected with Marcaine with epinephrine solution. I then used a scalpel to make a transverse incision in the patient's left anterior neck. I used the Metzenbaum scissors to divide the platysmal muscle and then to dissect medial to the sternocleidomastoid muscle, jugular vein, and carotid artery. I carefully dissected down towards the anterior cervical spine identifying the esophagus and retracting it medially. Then using Kitner swabs to  clear soft tissue from the anterior cervical spine. We then inserted a bent spinal needle into the upper exposed intervertebral disc space. We then obtained intraoperative radiographs confirm our location and counted down to the C5-6 interspace.  I then used electrocautery to detach the medial border of the longus colli muscle bilaterally from the C5-6 intervertebral disc spaces.  The patient had a huge osteophyte on the right.  I then inserted the Caspar self-retaining retractor underneath the longus colli muscle bilaterally to provide exposure.  We then incised the intervertebral disc at C5-6.  The disc space was quite spondylotic and required drilling away anterior osteophytes.. We then performed a partial intervertebral discectomy with a pituitary forceps and the Karlin curettes. I then inserted distraction screws into the vertebral bodies at C5 and C6. We then distracted the interspace. We then used the high-speed drill to decorticate the vertebral endplates at Q9-4, to drill away the remainder of the intervertebral disc, to drill away some posterior spondylosis, and to thin out the posterior longitudinal ligament. I then incised ligament with the arachnoid knife. We then removed the ligament with a Kerrison punches undercutting the vertebral endplates and decompressing the thecal sac. We then performed foraminotomies about the bilateral C6 neural nerve roots. This completed the decompression at this level.  We now turned our to attention to the interbody fusion. We used the trial spacers to determine the appropriate size for the interbody prosthesis. We then pre-filled prosthesis with a combination of local morcellized autograft bone that we obtained during decompression as well as Zimmer DBM. We then inserted the prosthesis into the distracted interspace at C5-6. We then removed the distraction screws. There was a good snug fit of the prosthesis in the interspace.  Having completed the fusion we now  turned attention to the anterior spinal instrumentation.  We used the high-speed drill to drill away some anterior spondylosis at the disc spaces so that the plate lay down flat. We selected the appropriate length titanium anterior cervical plate. We laid it along the anterior aspect of the vertebral bodies from C5-6. We then drilled 14 mm holes at C5 and C6.Marland Kitchen We then secured the plate to the vertebral bodies by placing two 14 mm self-tapping screws at C5 and C6.  The plate was somewhat rotated but I thought it better to leave in place then to reposition for fear of stripping the screws.  We then obtained intraoperative radiograph. The demonstrating good position of the instrumentation with limited visualization because of the patient's shoulders.  The construct looked good in vivo.  We therefore secured the screws the plate the locking each cam. This completed the instrumentation.  We then obtained hemostasis using bipolar electrocautery. We irrigated the wound out with bacitracin solution. We then removed the retractor. We inspected the esophagus for any damage. There was none apparent. We then reapproximated patient's platysmal muscle with interrupted 3-0 Vicryl suture. We then reapproximated the subcutaneous tissue with interrupted 3-0 Vicryl suture. The skin was reapproximated with Steri-Strips and benzoin. The wound was then covered with bacitracin ointment. A sterile dressing was applied. The drapes were removed. Patient was subsequently extubated by the anesthesia team and transported to the post anesthesia care unit in stable condition. All sponge instrument and needle counts were reportedly correct at the end of this case.

## 2020-06-17 NOTE — H&P (Signed)
Subjective: The patient is a 69 year old white male who has complained of neck pain, decreased range of motion with pain down his left arm.  He has failed medical management and was worked up with a cervical MRI which demonstrated the patient had spondylosis and stenosis most prominent at C5-6.  I discussed the various treatment options with him.  He has decided to proceed with surgery.  Past Medical History:  Diagnosis Date  . Atherosclerosis   . Hyperlipidemia   . Hypertension   . Myocardial infarction [facility])     Past Surgical History:  Procedure Laterality Date  . CARDIAC CATHETERIZATION      No Known Allergies  Social History   Tobacco Use  . Smoking status: Former Smoker    Quit date: 2000    Years since quitting: 21.6  . Smokeless tobacco: Current User  Substance Use Topics  . Alcohol use: Not on file    Comment: rare    No family history on file. Prior to Admission medications   Medication Sig Start Date End Date Taking? Authorizing Provider  aspirin EC 81 MG tablet Take 81 mg by mouth daily. Swallow whole.   Yes [provider]  atorvastatin (LIPITOR) 20 MG tablet Take 20 mg by mouth daily.   Yes [provider]  Coenzyme Q10 (CO Q-10) 200 MG CAPS Take 200 mg by mouth daily.   Yes [provider]  ibuprofen (ADVIL) 800 MG tablet Take 800 mg by mouth every 8 (eight) hours as needed for moderate pain.   Yes [provider]  lisinopril (ZESTRIL) 5 MG tablet Take 7.5 mg by mouth daily. 03/10/20  Yes [provider]  metoprolol succinate (TOPROL-XL) 50 MG 24 hr tablet Take 50 mg by mouth daily. 03/10/20  Yes [provider]  nitroGLYCERIN (NITROSTAT) 0.4 MG SL tablet Place 0.4 mg under the tongue every 5 (five) minutes as needed for chest pain.   Yes [provider]  prasugrel (EFFIENT) 10 MG TABS tablet Take 10 mg by mouth daily. 05/12/20  Yes [provider]  pregabalin (LYRICA) 150 MG capsule Take 150 mg by  mouth in the morning and at bedtime. 06/02/20  Yes [provider]     Review of Systems  Positive ROS: As above  All other systems have been reviewed and were otherwise negative with the exception of those mentioned in the HPI and as above.  Objective: Vital signs in last 24 hours:   Estimated body mass index is 35.25 kg/m as calculated from the following:   Height as of 06/15/20: 5\' 8"  (1.727 m).   Weight as of 06/15/20: 105.1 kg.   General Appearance: Alert Head: Normocephalic, without obvious abnormality, atraumatic Eyes: PERRL, conjunctiva/corneas clear, EOM's intact,    Ears: Normal  Throat: Normal  Neck: Positive Spurling's test on the left Back: unremarkable Lungs: Clear to auscultation bilaterally, respirations unlabored Heart: Regular rate and rhythm, no murmur, rub or gallop Abdomen: Soft, non-tender Extremities: Extremities normal, atraumatic, no cyanosis or edema Skin: unremarkable  NEUROLOGIC:   Mental status: alert and oriented,Motor Exam - grossly normal Sensory Exam - grossly normal Reflexes:  Coordination - grossly normal Gait - grossly normal Balance - grossly normal Cranial Nerves: I: smell Not tested  II: visual acuity  OS: Normal  OD: Normal   II: visual fields Full to confrontation  II: pupils Equal, round, reactive to light  III,VII: ptosis None  III,IV,VI: extraocular muscles  Full ROM  V: mastication Normal  V:  facial light touch sensation  Normal  V,VII: corneal reflex  Present  VII: facial muscle function - upper  Normal  VII: facial muscle function - lower Normal  VIII: hearing Not tested  IX: soft palate elevation  Normal  IX,X: gag reflex Present  XI: trapezius strength  5/5  XI: sternocleidomastoid strength 5/5  XI: neck flexion strength  5/5  XII: tongue strength  Normal    Data Review Lab Results  Component Value Date   WBC 11.9 (H) 06/15/2020   HGB 15.4 06/15/2020   HCT 48.3 06/15/2020   MCV 88.5 06/15/2020    PLT 255 06/15/2020   Lab Results  Component Value Date   NA 140 06/15/2020   K 4.2 06/15/2020   CL 109 06/15/2020   CO2 22 06/15/2020   BUN 9 06/15/2020   CREATININE 1.04 06/15/2020   GLUCOSE 124 (H) 06/15/2020   No results found for: INR, PROTIME  Assessment/Plan: C5-6 spondylosis, stenosis, cervical radiculopathy, cervical myelopathy: I have discussed the situation with the patient.  I have reviewed his imaging studies with him and pointed out the abnormalities.  We have discussed the various treatment options including surgery.  I have described the surgical treatment option of a C5-6 anterior cervicectomy, fusion and plating.  I have shown him surgical models.  I have given him a surgical pamphlet.  We have discussed the risk, benefits, alternatives, expected postoperative course, and likelihood of achieving our goals with surgery.  I have answered all his questions.  He has decided to proceed with surgery.   Ophelia Charter 06/17/2020 10:47 AM

## 2020-06-17 NOTE — Transfer of Care (Signed)
Immediate Anesthesia Transfer of Care Note  Patient: Bruce Smith  Procedure(s) Performed: ANTERIOR CERVICAL DECOMPRESSION/DISCECTOMY FUSION, INTERBODY PROSTHESIS, PLATE/SCREWS CERVICAL FIVE- CERVICAL SIX (N/A )  Patient Location: PACU  Anesthesia Type:General  Level of Consciousness: awake, alert  and oriented  Airway & Oxygen Therapy: Patient Spontanous Breathing and Patient connected to face mask oxygen  Post-op Assessment: Report given to RN, Post -op Vital signs reviewed and stable and Patient moving all extremities  Post vital signs: Reviewed and stable  Last Vitals:  Vitals Value Taken Time  BP 143/68 06/17/20 1758  Temp    Pulse 69 06/17/20 1759  Resp 13 06/17/20 1759  SpO2 97 % 06/17/20 1759  Vitals shown include unvalidated device data.  Last Pain:  Vitals:   06/17/20 1324  TempSrc:   PainSc: 4       Patients Stated Pain Goal: 3 (43/20/03 7944)  Complications: No complications documented.

## 2020-06-18 DIAGNOSIS — M50022 Cervical disc disorder at C5-C6 level with myelopathy: Secondary | ICD-10-CM | POA: Diagnosis not present

## 2020-06-18 MED ORDER — DOCUSATE SODIUM 100 MG PO CAPS: 100.0000 mg | ORAL_CAPSULE | Freq: Two times a day (BID) | ORAL | 0 refills | Status: DC

## 2020-06-18 MED ORDER — CYCLOBENZAPRINE HCL 10 MG PO TABS: 10.0000 mg | ORAL_TABLET | Freq: Three times a day (TID) | ORAL | 0 refills | Status: DC | PRN

## 2020-06-18 MED ORDER — MUPIROCIN 2 % EX OINT
1.0000 "application " | TOPICAL_OINTMENT | Freq: Two times a day (BID) | CUTANEOUS | Status: DC
  Administered 2020-06-18: 1 via NASAL
  Filled 2020-06-18: qty 22

## 2020-06-18 MED ORDER — OXYCODONE-ACETAMINOPHEN 10-325 MG PO TABS: 1.0000 | ORAL_TABLET | ORAL | 0 refills | Status: DC | PRN

## 2020-06-18 NOTE — Evaluation (Signed)
Occupational Therapy Evaluation Patient Details Name: Bruce Smith MRN: [id number] DOB: [birth year] Today's Date: 06/18/2020    History of Present Illness 69 yo male presenting s/p C5-6 ACDF. PMH including atherosclerosis, hyperlipidemia, HTN, and MI and s/p cardiac cath.    Clinical Impression   PTA, pt was living with his wife and was independent; retired and owns horse farm. Pt currently performing ADLs and functional mobility at Mod I-Independent level. Provided education and handout on cervical precautions, bed mobility, brace management/care, UB ADLs, LB ADLs, toileting, and functional transfer; pt demonstrated understanding. Answered all pt questions. Recommend dc home once medically stable per physician. All acute OT needs met and will sign off. Thank you.     Follow Up Recommendations  No OT follow up    Equipment Recommendations  None recommended by OT    Recommendations for Other Services       Precautions / Restrictions Precautions Precautions: Cervical Precaution Booklet Issued: Yes (comment) Precaution Comments: Reviewed cervical precautions and comepnsatory techniques for ADLs Required Braces or Orthoses: Cervical Brace Cervical Brace: Hard collar;At all times Restrictions Weight Bearing Restrictions: No      Mobility Bed Mobility Overal bed mobility: Modified Independent             General bed mobility comments: Increased time. education on bed mobility and use of log roll  Transfers Overall transfer level: Independent                    Balance Overall balance assessment: No apparent balance deficits (not formally assessed)                                         ADL either performed or assessed with clinical judgement   ADL Overall ADL's : Modified independent                                       General ADL Comments: Increased time as needed. Providing education on compensatory techniques for UB  ADLs, LB ADLs, brace management, bed mobility, grooming, toileting, and shower transfer.      Vision Baseline Vision/History: Wears glasses Wears Glasses: At all times Patient Visual Report: No change from baseline       Perception     Praxis      Pertinent Vitals/Pain Pain Assessment: 0-10 Pain Score: 5  Pain Location: Neck Pain Descriptors / Indicators: Discomfort (with movement) Pain Intervention(s): Monitored during session     Hand Dominance Right   Extremity/Trunk Assessment Upper Extremity Assessment Upper Extremity Assessment: Overall WFL for tasks assessed (Slight weakness at )   Lower Extremity Assessment Lower Extremity Assessment: Overall WFL for tasks assessed   Cervical / Trunk Assessment Cervical / Trunk Assessment: Other exceptions Cervical / Trunk Exceptions: cervical precautions   Communication Communication Communication: No difficulties   Cognition Arousal/Alertness: Awake/alert Behavior During Therapy: WFL for tasks assessed/performed Overall Cognitive Status: Within Functional Limits for tasks assessed                                     General Comments       Exercises     Shoulder Instructions      Home Living Family/patient expects to be discharged to::  Private residence Living Arrangements: Spouse/significant other Available Help at Discharge: Family;Available 24 hours/day Type of Home: House Home Access: Stairs to enter Technical brewer of Steps: 2         Bathroom Shower/Tub: Occupational psychologist: Standard     Home Equipment: Civil engineer, contracting - built in;Hand held shower head   Additional Comments: Has an adjustable bed      Prior Functioning/Environment Level of Independence: Independent        Comments: ADLs, IADLs, and has a horse farm        OT Problem List: Decreased range of motion;Decreased knowledge of use of DME or AE;Decreased knowledge of precautions;Pain      OT  Treatment/Interventions:      OT Goals(Current goals can be found in the care plan section) Acute Rehab OT Goals Patient Stated Goal: Go home OT Goal Formulation: All assessment and education complete, DC therapy  OT Frequency:     Barriers to D/C:            Co-evaluation              AM-PAC OT "6 Clicks" Daily Activity     Outcome Measure Help from another person eating meals?: None Help from another person taking care of personal grooming?: None Help from another person toileting, which includes using toliet, bedpan, or urinal?: None Help from another person bathing (including washing, rinsing, drying)?: None Help from another person to put on and taking off regular upper body clothing?: None Help from another person to put on and taking off regular lower body clothing?: None 6 Click Score: 24   End of Session Equipment Utilized During Treatment: Cervical collar Nurse Communication: Mobility status  Activity Tolerance: Patient tolerated treatment well Patient left: in chair;with call bell/phone within reach  OT Visit Diagnosis: Muscle weakness (generalized) (M62.81);Pain Pain - part of body:  (Neck)                Time: 6015-6153 OT Time Calculation (min): 22 min Charges:  OT General Charges $OT Visit: 1 Visit OT Evaluation $OT Eval Low Complexity: Dunn, OTR/L Acute Rehab Pager: [phone] Office: [place] 06/18/2020, 8:18 AM

## 2020-06-18 NOTE — Anesthesia Postprocedure Evaluation (Signed)
Anesthesia Post Note  Patient: Bruce Smith  Procedure(s) Performed: ANTERIOR CERVICAL DECOMPRESSION/DISCECTOMY FUSION, INTERBODY PROSTHESIS, PLATE/SCREWS CERVICAL FIVE- CERVICAL SIX (N/A )     Patient location during evaluation: PACU Anesthesia Type: General Level of consciousness: awake and alert Pain management: pain level controlled Vital Signs Assessment: post-procedure vital signs reviewed and stable Respiratory status: spontaneous breathing, nonlabored ventilation, respiratory function stable and patient connected to nasal cannula oxygen Cardiovascular status: blood pressure returned to baseline and stable Postop Assessment: no apparent nausea or vomiting Anesthetic complications: no   No complications documented.  Last Vitals:  Vitals:   06/18/20 0343 06/18/20 0817  BP: 134/64 (!) 151/69  Pulse: (!) 58 64  Resp: 20 19  Temp: 36.7 C 36.7 C  SpO2: 94% 94%    Last Pain:  Vitals:   06/18/20 0745  TempSrc:   PainSc: 3                  Gazella Anglin,Densel COKER

## 2020-06-18 NOTE — Discharge Instructions (Signed)
Wound Care Keep incision covered and dry for two days.    Do not put any creams, lotions, or ointments on incision. Leave steri-strips on back.  They will fall off by themselves.  Activity Walk each and every day, increasing distance each day. No lifting greater than 5 lbs.  Avoid excessive neck motion. No driving for 2 weeks; may ride as a passenger locally.  Diet Resume your normal diet.   Return to Work Will be discussed at your follow up appointment.  Call Your Doctor If Any of These Occur Redness, drainage, or swelling at the wound.  Temperature greater than 101 degrees. Severe pain not relieved by pain medication. Incision starts to come apart.  Follow Up Appt Call today for appointment in 1-2 weeks ([phone]) or for problems.  If you have any hardware placed in your spine, you will need an x-ray before your appointment.  

## 2020-06-18 NOTE — Plan of Care (Signed)
Pt doing well. Pt and wife given D/C instructions with verbal understanding. Rx's were sent to the pharmacy per MD order. Pt's incision is clean and dry with no sign of infection. Pt's IV was removed prior to D/C. Pt D/C'd home via wheelchair per MD order. Pt is stable D/C and has no other needs at this time. Holli Humbles, RN

## 2020-06-18 NOTE — Discharge Summary (Signed)
Physician Discharge Summary     Providing Compassionate, Quality Care - Together   Patient ID: Bruce Smith MRN: [id number] DOB/AGE: 69-May-1952 69 y.o.  Admit date: 06/17/2020 Discharge date: 06/18/2020  Admission Diagnoses: Cervical spondylosis with myelopathy and radiculopathy  Discharge Diagnoses:  Active Problems:   Cervical spondylosis with myelopathy and radiculopathy   Discharged Condition: good  Hospital Course: Patient underwent a C5-6 ACDF by Dr. Arnoldo Morale on 06/17/2020. He was admitted to [facility] overnight for observation. His postoperative course has been uncomplicated. He has worked with both physical and occupational therapies who feel he is ready for discharge home. He is ambulating independently and without difficulty. He is tolerating a normal diet. He is not having any bowel or bladder dysfunction. His pain is well-controlled with oral pain medication. He is ready for discharge home.   Consults: rehabilitation medicine  Significant Diagnostic Studies: radiology: DG Cervical Spine 2-3 Views  Result Date: 06/17/2020 CLINICAL DATA:  Intraoperative localization for spine surgery. EXAM: CERVICAL SPINE - 2-3 VIEW COMPARISON:  MRI cervical spine 05/28/2020 FINDINGS: Lateral spot films from the OR demonstrate a spinal needle on film 1 located at the C5-6 level. The second film shows a spinal needle at the C4-5 level. IMPRESSION: C4-5 and C5-6 localized. Electronically Signed   By: Marijo Sanes M.D.   On: 06/17/2020 19:33     Treatments: surgery: C5-6 anterior cervical discectomy/decompression; C5-6 interbody arthrodesis with local morcellized autograft bone and Zimmer DBM; insertion of interbody prosthesis at C5-6 (Zimmer peek interbody prosthesis); anterior cervical plating from C5-6 with globus titanium plate  Discharge Exam: Blood pressure (!) 151/69, pulse 64, temperature 98 F (36.7 C), resp. rate 19, height 5\' 8"  (1.727 m), weight 102.1 kg, SpO2 94 %.  Alert and  oriented x 4 PERRLA CN II-XII grossly intact MAE, Strength and sensation intact Incision is covered with Honeycomb dressing and Steri Strips; Dressing is clean, dry, and intact   Disposition: Discharge disposition: 01-Home or Self Care        Allergies as of 06/18/2020   No Known Allergies     Medication List    STOP taking these medications   ibuprofen 800 MG tablet Commonly known as: ADVIL     TAKE these medications   aspirin EC 81 MG tablet Take 81 mg by mouth daily. Swallow whole.   atorvastatin 20 MG tablet Commonly known as: LIPITOR Take 20 mg by mouth daily.   Co Q-10 200 MG Caps Take 200 mg by mouth daily.   cyclobenzaprine 10 MG tablet Commonly known as: FLEXERIL Take 1 tablet (10 mg total) by mouth 3 (three) times daily as needed for muscle spasms.   docusate sodium 100 MG capsule Commonly known as: COLACE Take 1 capsule (100 mg total) by mouth 2 (two) times daily.   lisinopril 5 MG tablet Commonly known as: ZESTRIL Take 7.5 mg by mouth daily.   metoprolol succinate 50 MG 24 hr tablet Commonly known as: TOPROL-XL Take 50 mg by mouth daily.   nitroGLYCERIN 0.4 MG SL tablet Commonly known as: NITROSTAT Place 0.4 mg under the tongue every 5 (five) minutes as needed for chest pain.   oxyCODONE-acetaminophen 10-325 MG tablet Commonly known as: Percocet Take 1 tablet by mouth every 4 (four) hours as needed for pain.   prasugrel 10 MG Tabs tablet Commonly known as: EFFIENT Take 10 mg by mouth daily.   pregabalin 150 MG capsule Commonly known as: LYRICA Take 150 mg by mouth in the morning and at bedtime.  Follow-up Information    Newman Pies, MD. Schedule an appointment as soon as possible for a visit in 2 week(s).   Specialty: Neurosurgery Contact information: [address] [place] [address] [place] [address] [phone]               Signed: Patricia Nettle 06/18/2020, 8:32 AM

## 2020-06-19 ENCOUNTER — Encounter (HOSPITAL_COMMUNITY): Payer: Self-pay | Admitting: Neurosurgery

## 2020-06-26 ENCOUNTER — Encounter (HOSPITAL_BASED_OUTPATIENT_CLINIC_OR_DEPARTMENT_OTHER): Payer: Self-pay | Admitting: *Deleted

## 2020-06-26 ENCOUNTER — Other Ambulatory Visit: Payer: Self-pay

## 2020-06-26 ENCOUNTER — Emergency Department (HOSPITAL_BASED_OUTPATIENT_CLINIC_OR_DEPARTMENT_OTHER): Payer: BC Managed Care – PPO

## 2020-06-26 ENCOUNTER — Emergency Department (HOSPITAL_BASED_OUTPATIENT_CLINIC_OR_DEPARTMENT_OTHER)
Admission: EM | Admit: 2020-06-26 | Discharge: 2020-06-26 | Disposition: A | Discharge status: Home / Self Care | Payer: BC Managed Care – PPO | Attending: Emergency Medicine | Admitting: Emergency Medicine

## 2020-06-26 DIAGNOSIS — Z7982 Long term (current) use of aspirin: Secondary | ICD-10-CM | POA: Diagnosis not present

## 2020-06-26 DIAGNOSIS — R42 Dizziness and giddiness: Secondary | ICD-10-CM | POA: Diagnosis not present

## 2020-06-26 DIAGNOSIS — W108XXA Fall (on) (from) other stairs and steps, initial encounter: Secondary | ICD-10-CM | POA: Insufficient documentation

## 2020-06-26 DIAGNOSIS — R10811 Right upper quadrant abdominal tenderness: Secondary | ICD-10-CM | POA: Insufficient documentation

## 2020-06-26 DIAGNOSIS — Y92009 Unspecified place in unspecified non-institutional (private) residence as the place of occurrence of the external cause: Secondary | ICD-10-CM | POA: Diagnosis not present

## 2020-06-26 DIAGNOSIS — I219 Acute myocardial infarction, unspecified: Secondary | ICD-10-CM | POA: Diagnosis not present

## 2020-06-26 DIAGNOSIS — Z720 Tobacco use: Secondary | ICD-10-CM | POA: Insufficient documentation

## 2020-06-26 DIAGNOSIS — Z9861 Coronary angioplasty status: Secondary | ICD-10-CM | POA: Diagnosis not present

## 2020-06-26 DIAGNOSIS — M898X8 Other specified disorders of bone, other site: Secondary | ICD-10-CM | POA: Diagnosis not present

## 2020-06-26 DIAGNOSIS — I10 Essential (primary) hypertension: Secondary | ICD-10-CM | POA: Insufficient documentation

## 2020-06-26 DIAGNOSIS — Y998 Other external cause status: Secondary | ICD-10-CM | POA: Insufficient documentation

## 2020-06-26 DIAGNOSIS — Z79899 Other long term (current) drug therapy: Secondary | ICD-10-CM | POA: Diagnosis not present

## 2020-06-26 DIAGNOSIS — Y93B9 Activity, other involving muscle strengthening exercises: Secondary | ICD-10-CM | POA: Insufficient documentation

## 2020-06-26 DIAGNOSIS — W19XXXA Unspecified fall, initial encounter: Secondary | ICD-10-CM

## 2020-06-26 DIAGNOSIS — S42112A Displaced fracture of body of scapula, left shoulder, initial encounter for closed fracture: Secondary | ICD-10-CM | POA: Diagnosis not present

## 2020-06-26 DIAGNOSIS — S4992XA Unspecified injury of left shoulder and upper arm, initial encounter: Secondary | ICD-10-CM | POA: Diagnosis present

## 2020-06-26 DIAGNOSIS — M899 Disorder of bone, unspecified: Secondary | ICD-10-CM

## 2020-06-26 LAB — COMPREHENSIVE METABOLIC PANEL
ALT: 39 U/L (ref 0–44)
AST: 22 U/L (ref 15–41)
Albumin: 3.7 g/dL (ref 3.5–5.0)
Alkaline Phosphatase: 130 U/L — ABNORMAL HIGH (ref 38–126)
Anion gap: 9 (ref 5–15)
BUN: 19 mg/dL (ref 8–23)
CO2: 26 mmol/L (ref 22–32)
Calcium: 11.1 mg/dL — ABNORMAL HIGH (ref 8.9–10.3)
Chloride: 102 mmol/L (ref 98–111)
Creatinine, Ser: 1.19 mg/dL (ref 0.61–1.24)
GFR calc Af Amer: >60 mL/min (ref >60)
GFR calc non Af Amer: >60 mL/min (ref >60)
Glucose, Bld: 131 mg/dL — ABNORMAL HIGH (ref 70–99)
Potassium: 4.4 mmol/L (ref 3.5–5.1)
Sodium: 137 mmol/L (ref 135–145)
Total Bilirubin: 0.7 mg/dL (ref 0.3–1.2)
Total Protein: 6.8 g/dL (ref 6.5–8.1)

## 2020-06-26 LAB — CBC WITH DIFFERENTIAL/PLATELET
Abs Immature Granulocytes: 0.12 10*3/uL — ABNORMAL HIGH (ref 0.00–0.07)
Basophils Absolute: 0 10*3/uL (ref 0.0–0.1)
Basophils Relative: 0 %
Eosinophils Absolute: 0.2 10*3/uL (ref 0.0–0.5)
Eosinophils Relative: 1 %
HCT: 45 % (ref 39.0–52.0)
Hemoglobin: 14.9 g/dL (ref 13.0–17.0)
Immature Granulocytes: 1 %
Lymphocytes Relative: 23 %
Lymphs Abs: 3 10*3/uL (ref 0.7–4.0)
MCH: 28.7 pg (ref 26.0–34.0)
MCHC: 33.1 g/dL (ref 30.0–36.0)
MCV: 86.7 fL (ref 80.0–100.0)
Monocytes Absolute: 1.3 10*3/uL — ABNORMAL HIGH (ref 0.1–1.0)
Monocytes Relative: 10 %
Neutro Abs: 8.8 10*3/uL — ABNORMAL HIGH (ref 1.7–7.7)
Neutrophils Relative %: 65 %
Platelets: 289 10*3/uL (ref 150–400)
RBC: 5.19 MIL/uL (ref 4.22–5.81)
RDW: 13.1 % (ref 11.5–15.5)
WBC: 13.4 10*3/uL — ABNORMAL HIGH (ref 4.0–10.5)
nRBC: 0 % (ref 0.0–0.2)

## 2020-06-26 LAB — LACTATE DEHYDROGENASE: LDH: 171 U/L (ref 98–192)

## 2020-06-26 LAB — PSA: Prostatic Specific Antigen: 3.24 ng/mL (ref 0.00–4.00)

## 2020-06-26 LAB — LIPASE, BLOOD: Lipase: 30 U/L (ref 11–51)

## 2020-06-26 IMAGING — DX DG SHOULDER 2+V*L*
3 series · 3 of 3 positions shown · non-contrast
Comparison: None.

CLINICAL DATA: Recent fall with left shoulder pain, initial
encounter

EXAM:
LEFT SHOULDER - 2+ VIEW

[shoulder grashey]
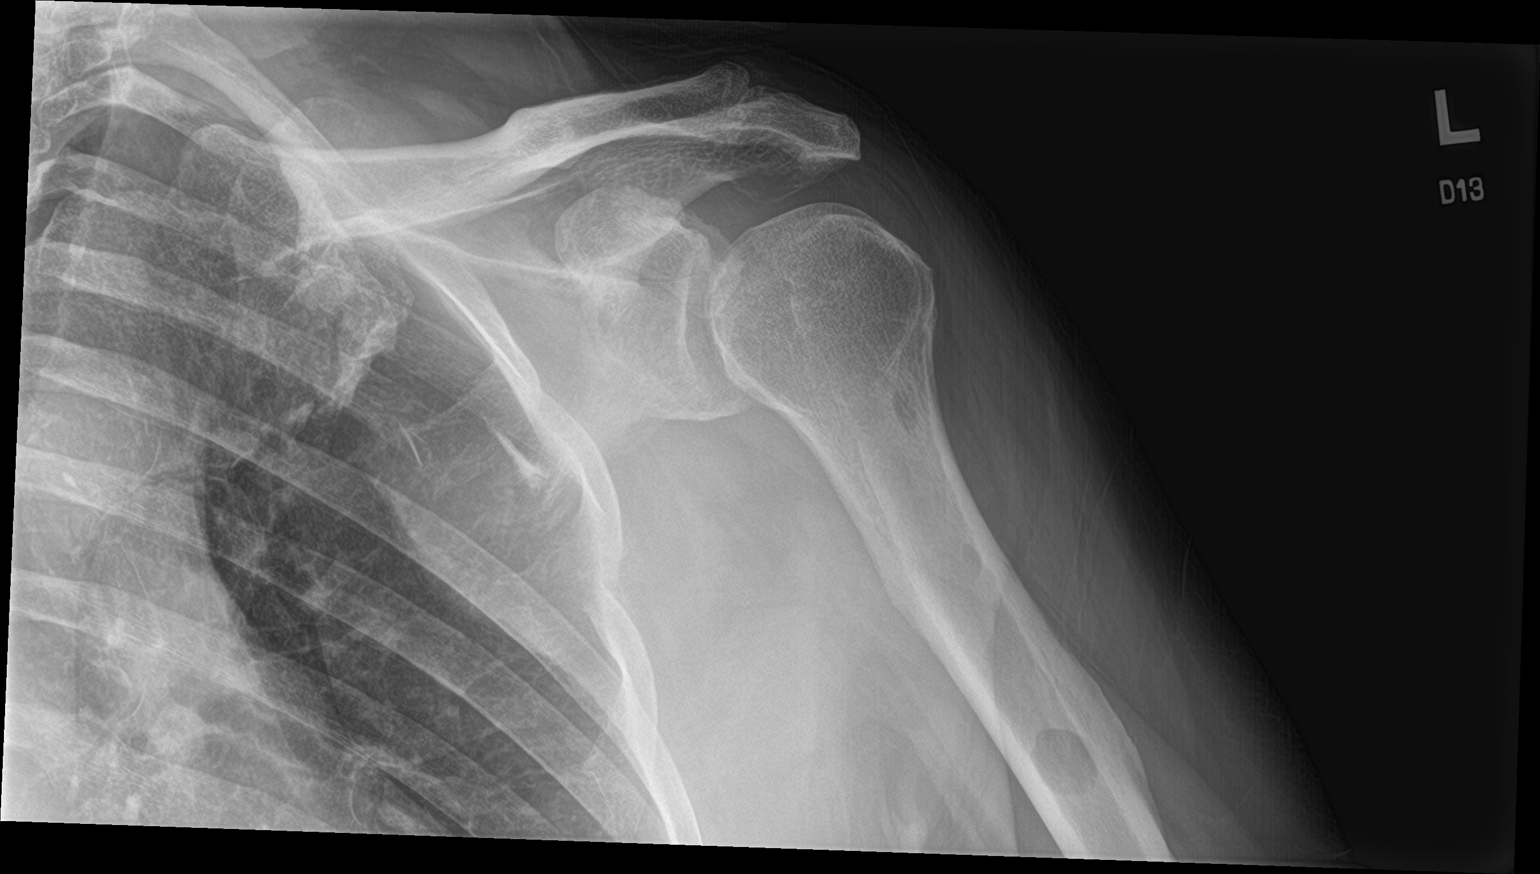

[shoulder y view]
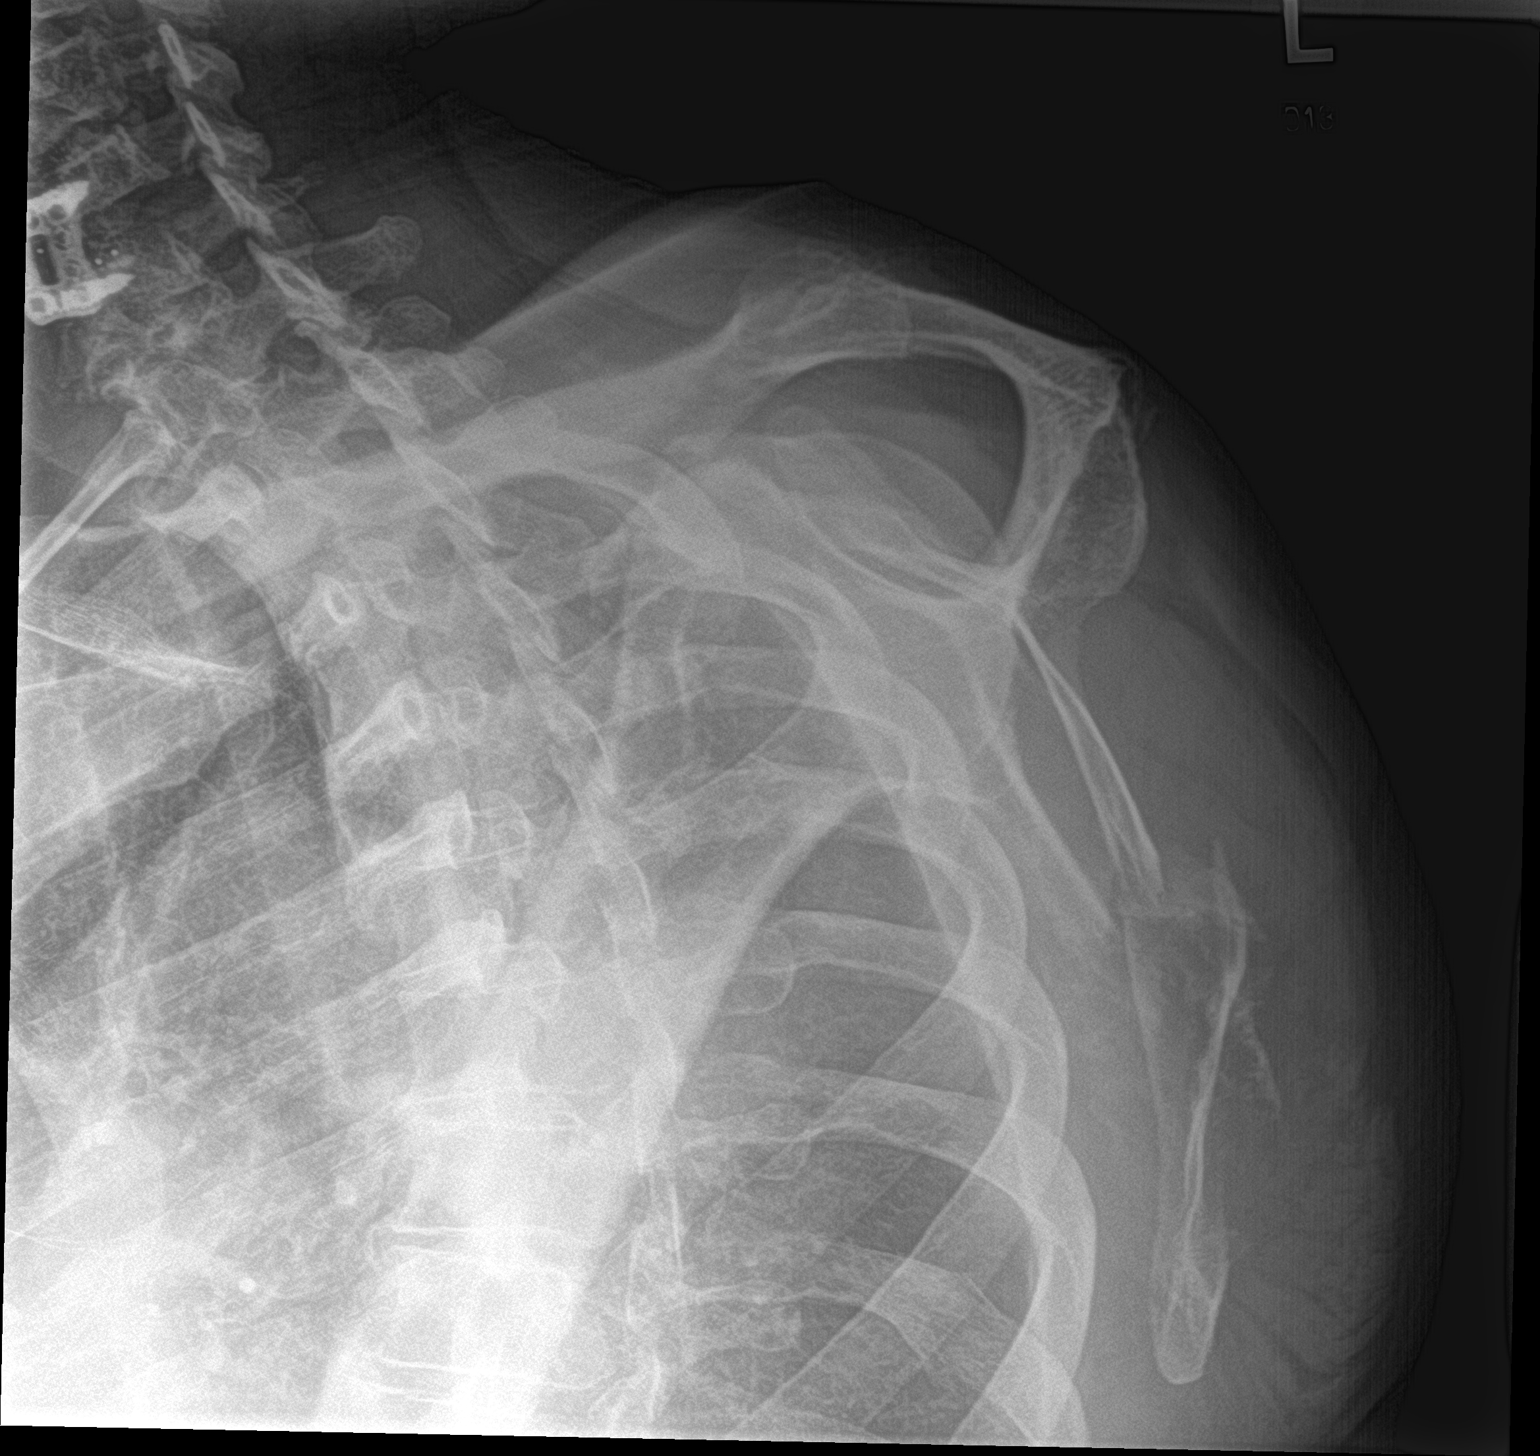

[shoulder ap neutral]
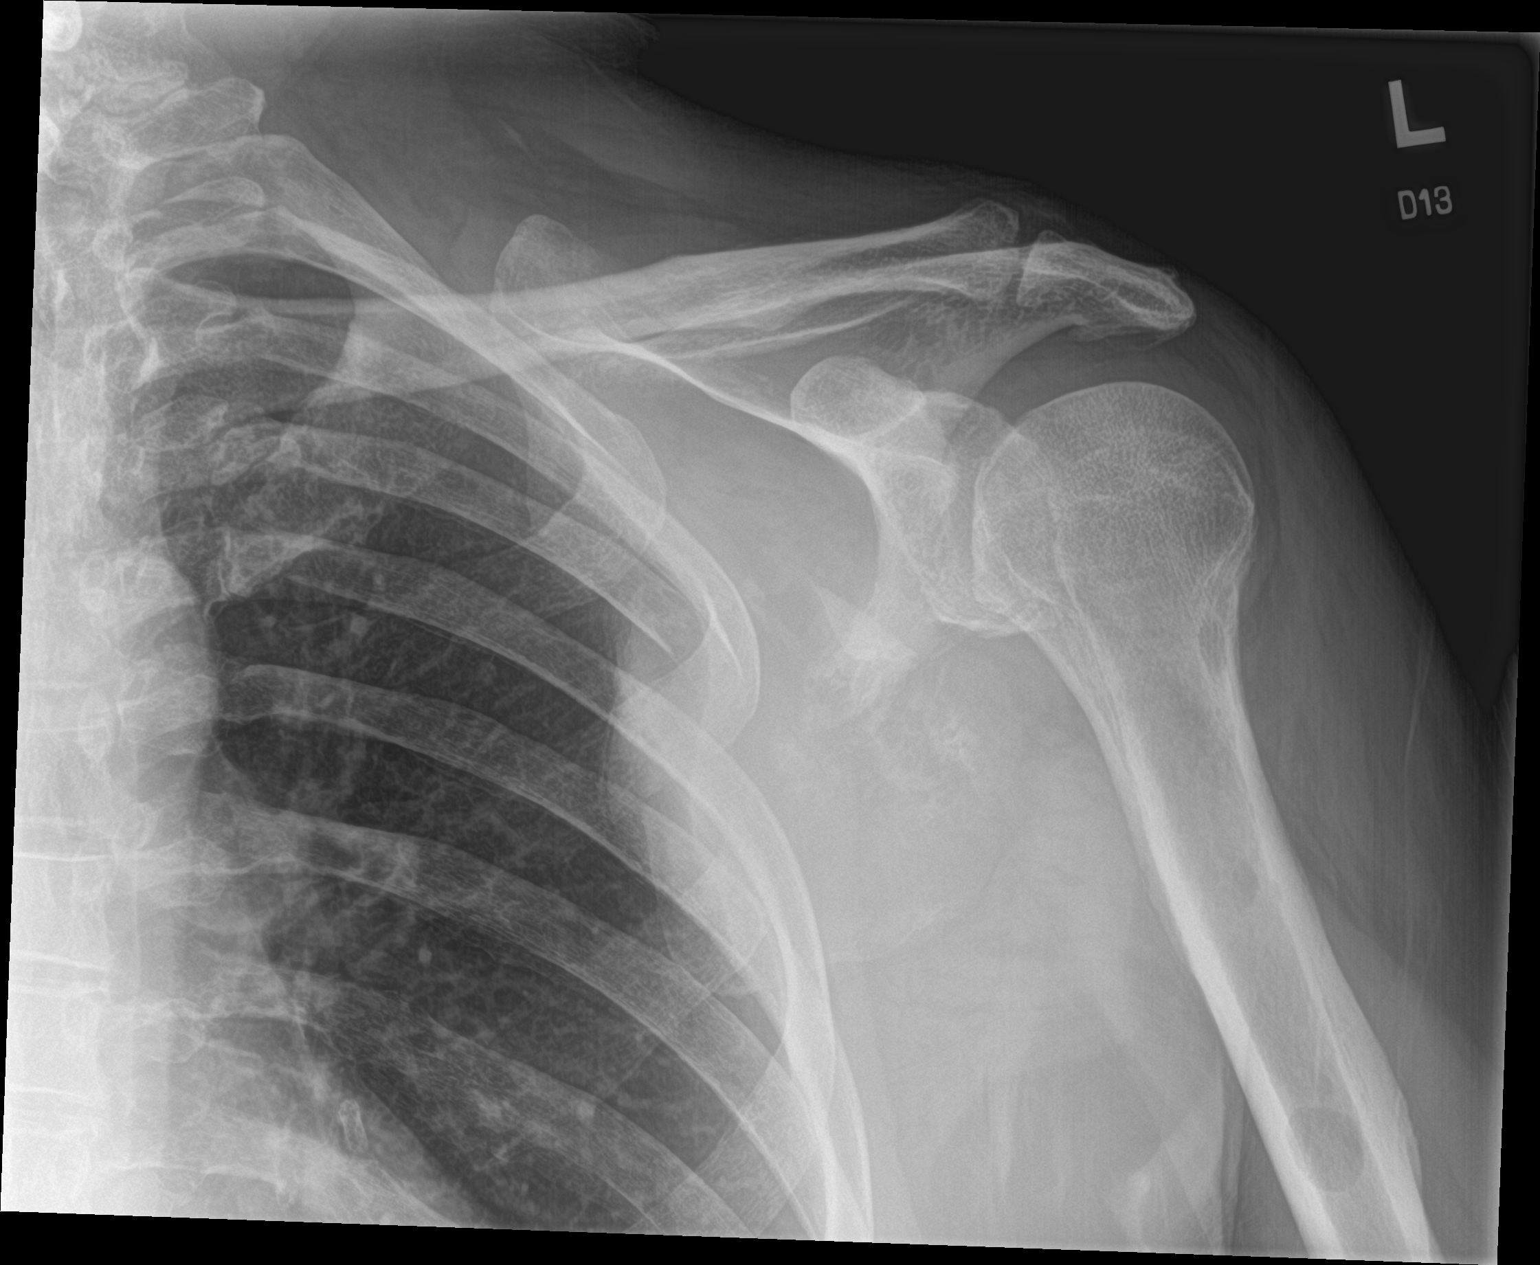

[3 of 3 positions shown; findings below may reference images not displayed]

FINDINGS: Degenerative changes of the acromioclavicular joint are noted. Mild
glenohumeral degenerative changes are seen as well. Comminuted
scapular fracture is noted in the mid body. Additionally there is an
erosive lesion involving the fourth left rib posterolaterally likely
representing metastatic disease. Lucencies are noted within the
proximal humeral shaft which are better visualized than that seen on
prior exam also likely representing metastatic disease. CT of the
chest is recommended for further evaluation.
IMPRESSION: Comminuted fracture of the scapula. Given the findings described
below this is felt to be a pathologic fracture.

Multiple lucent lesions within the proximal left humerus consistent
with metastatic disease.

Erosive changes of the fourth left rib laterally with underlying
soft tissue mass consistent with metastatic disease.

CT of the chest is recommended for further evaluation.

## 2020-06-26 IMAGING — CT CT ANGIO CHEST
3 of 12 series · 13 of 37 positions shown · IV contrast (Omnipaque)
Comparison: Left shoulder series earlier today.

CLINICAL DATA: Dizziness. Recent cervical spine surgery. Fall,
scapular fracture.

EXAM:
CT ANGIOGRAPHY CHEST
CT ABDOMEN AND PELVIS WITH CONTRAST
TECHNIQUE: Multidetector CT imaging of the chest was performed using the
standard protocol during bolus administration of intravenous
contrast. Multiplanar CT image reconstructions and MIPs were
obtained to evaluate the vascular anatomy. Multidetector CT imaging
of the abdomen and pelvis was performed using the standard protocol
during bolus administration of intravenous contrast.
CONTRAST:  100mL OMNIPAQUE IOHEXOL 350 MG/ML SOLN

[Series 4: pe axial st · axial · 0.98mm/px · z∈[-346,-256]mm · 2 of 92 slices shown]
[im 31/92  lung]
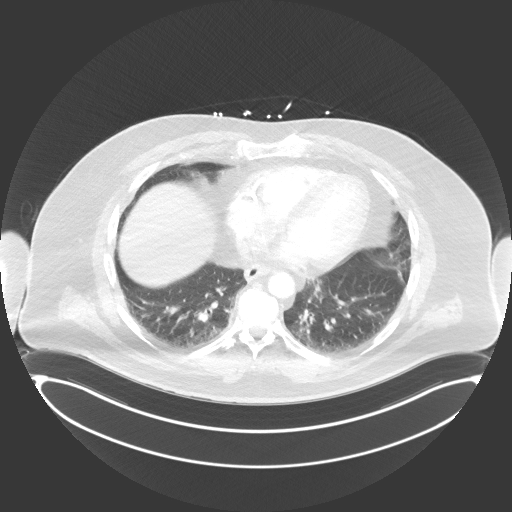
[im 61/92  lung]
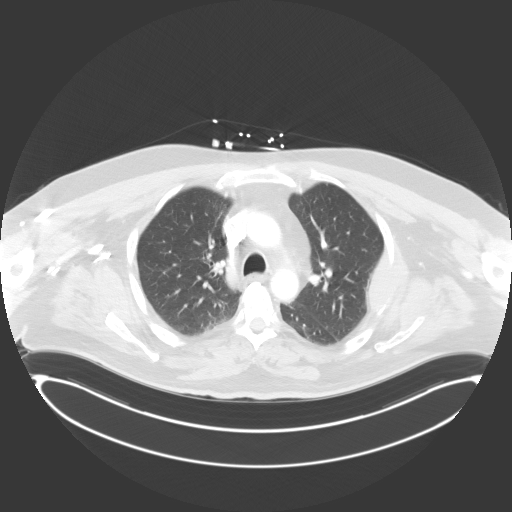

[Series 6: pe thins · axial · 0.98mm/px · z∈[-418,-183]mm · 8 of 275 slices shown]
[im 20/275  lung]
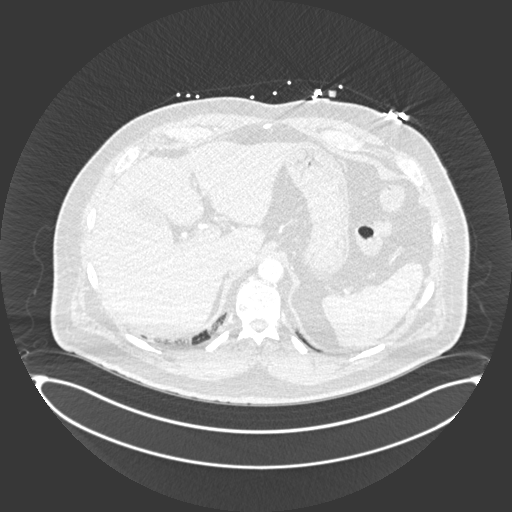
[im 59/275  lung]
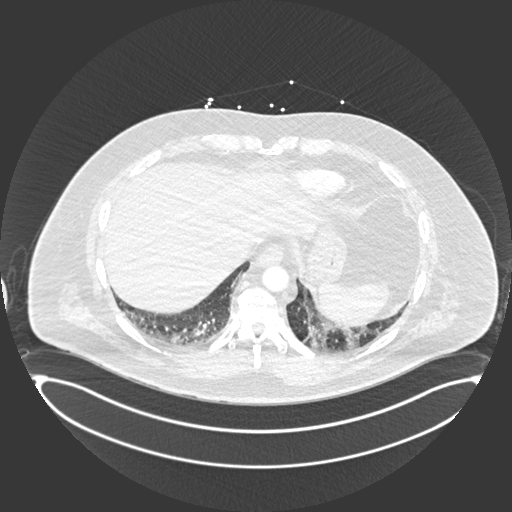
[im 98/275  lung]
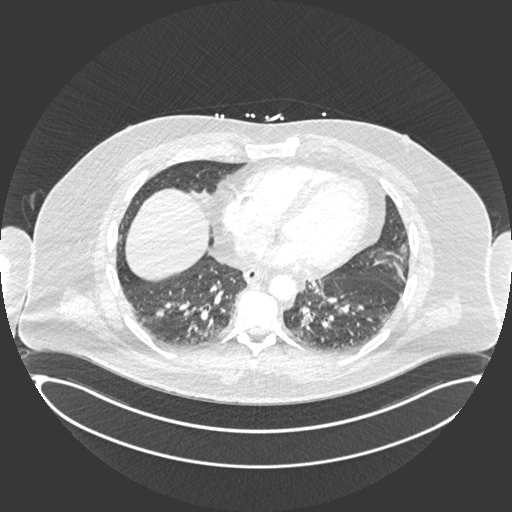
[im 118/275  lung]
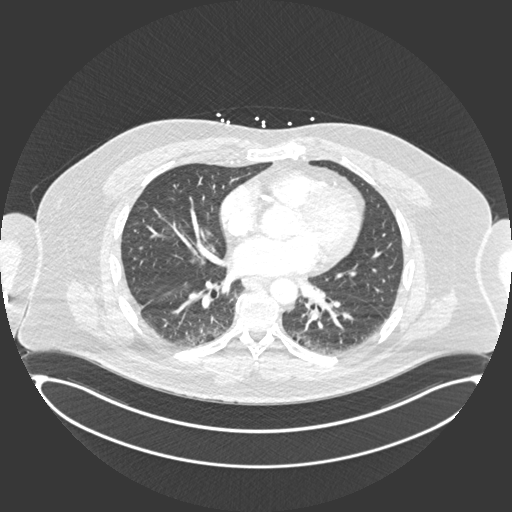
[im 157/275  lung]
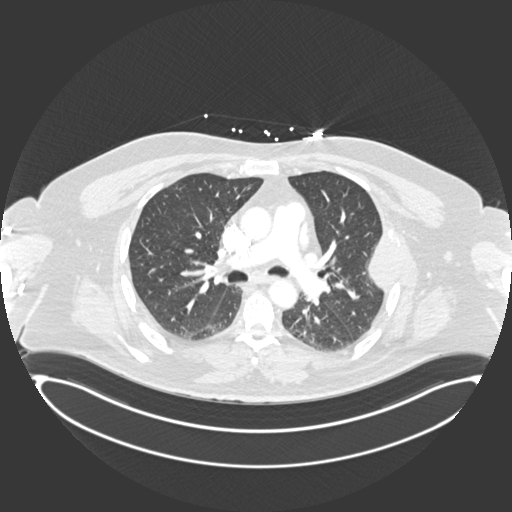
[im 177/275  lung]
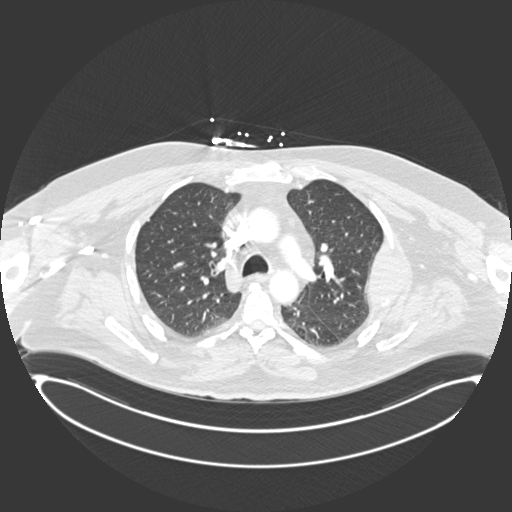
[im 216/275  lung]
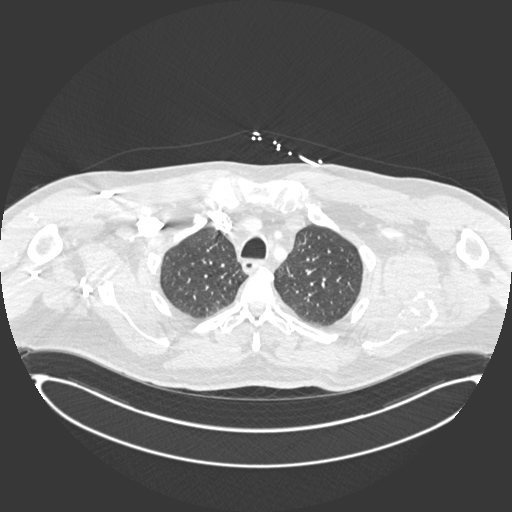
[im 255/275  lung]
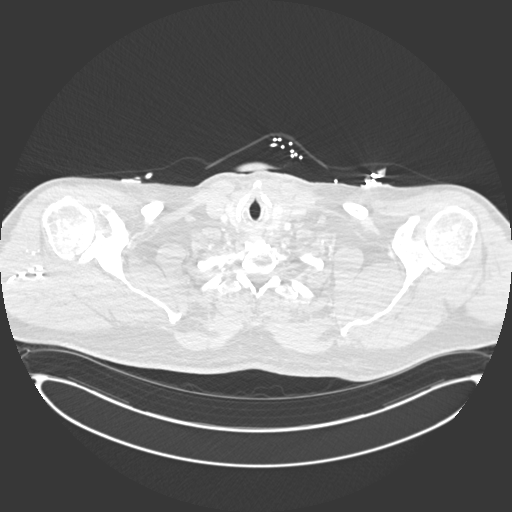

[Series 11: axial st · axial · 0.98mm/px · z∈[-693,-443]mm · 3 of 100 slices shown]
[im 25/100  lung]
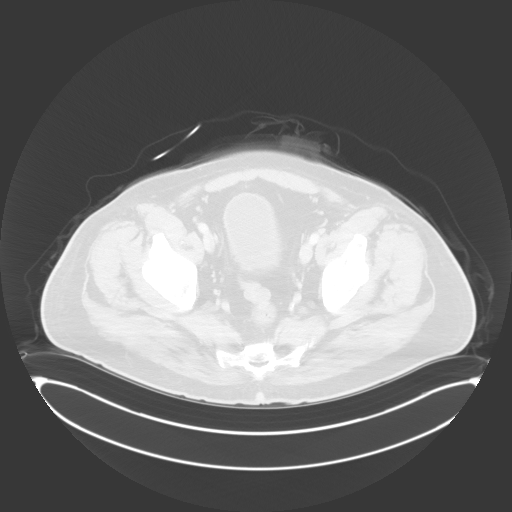
[im 50/100  mediastinal]
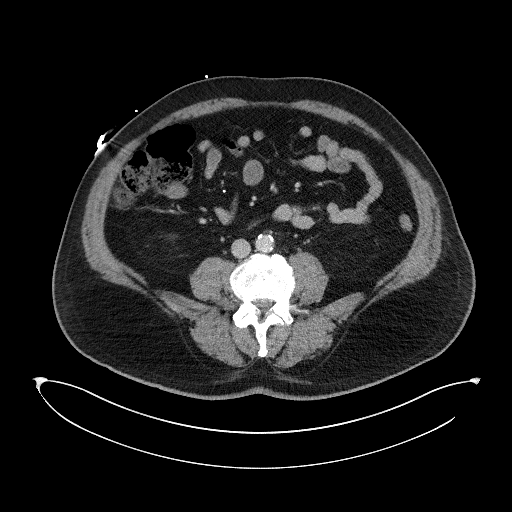
[im 75/100  lung]
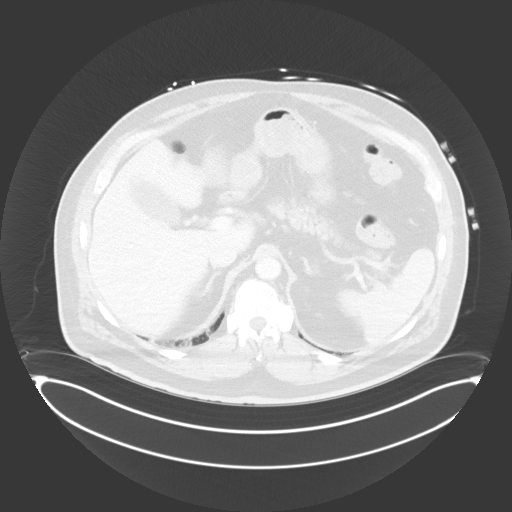

[13 of 37 positions shown; findings below may reference images not displayed]

FINDINGS: CTA CHEST FINDINGS

Cardiovascular: Is no filling defects in the pulmonary arteries to
suggest pulmonary emboli. Coronary artery calcifications diffusely.
Scattered aortic calcifications. No aneurysm. Heart is normal size.

Mediastinum/Nodes: No mediastinal, hilar, or axillary adenopathy.
Trachea and esophagus are unremarkable. Thyroid unremarkable.

Lungs/Pleura: Linear bibasilar subsegmental atelectasis or scarring.
No effusions.

Musculoskeletal: A lytic expansile lesion within the left scapula.
There is a fracture through the left scapula as well. There is a
lytic expansile lytic lesion within the lateral left 4th rib. Soft
tissue mass measures 6.4 x 4.0 cm. Lucent lesion seen within the T1
vertebral body. Multiple small lytic lesions noted throughout the
remainder of the bones including right scapula, bilateral ribs, T11.

Review of the MIP images confirms the above findings.

CT ABDOMEN and PELVIS FINDINGS

Hepatobiliary: No focal hepatic abnormality. Gallbladder
unremarkable.

Pancreas: No focal abnormality or ductal dilatation.

Spleen: No focal abnormality.  Normal size.

Adrenals/Urinary Tract: No adrenal abnormality. No focal renal
abnormality. No stones or hydronephrosis. Urinary bladder is
unremarkable. Small cyst in the lower pole of the right kidney.

Stomach/Bowel: Normal appendix. Stomach, large and small bowel
grossly unremarkable.

Vascular/Lymphatic: Aortic atherosclerosis. No evidence of aneurysm
or adenopathy.

Reproductive: Prostate enlargement.

Other: No free fluid or free air.

Musculoskeletal: Numerous lytic lesions throughout the lumbar spine
and bony pelvis.

Review of the MIP images confirms the above findings.
IMPRESSION: Numerous diffuse low-density lesions throughout the bony structures
of the chest abdomen or pelvis including bilateral scapula, multiple
ribs, thoracic spine, lumbar spine and bony pelvis. Findings are
concerning for metastasis or myeloma.

Fracture through the left scapula, possibly pathologic fracture with
the adjacent expansile lytic lesion in the left scapula.

Expansile lytic lesion with soft tissue mass in the left lateral 4th
rib.

No evidence of pulmonary embolus.

Coronary artery disease, aortic atherosclerosis.

Prostate enlargement.

## 2020-06-26 IMAGING — CT CT ABD-PELV W/ CM
2 of 13 series · 11 of 46 positions shown, 15 images · IV contrast (Omnipaque)
Comparison: Left shoulder series earlier today.

CLINICAL DATA: Dizziness. Recent cervical spine surgery. Fall,
scapular fracture.

EXAM:
CT ANGIOGRAPHY CHEST
CT ABDOMEN AND PELVIS WITH CONTRAST
TECHNIQUE: Multidetector CT imaging of the chest was performed using the
standard protocol during bolus administration of intravenous
contrast. Multiplanar CT image reconstructions and MIPs were
obtained to evaluate the vascular anatomy. Multidetector CT imaging
of the abdomen and pelvis was performed using the standard protocol
during bolus administration of intravenous contrast.
CONTRAST:  100mL OMNIPAQUE IOHEXOL 350 MG/ML SOLN

[Series 7: pe coronal mpr · coronal · 0.56mm/px · 1 of 151 slices shown, 2 images]
[im 76/151  soft-tissue]
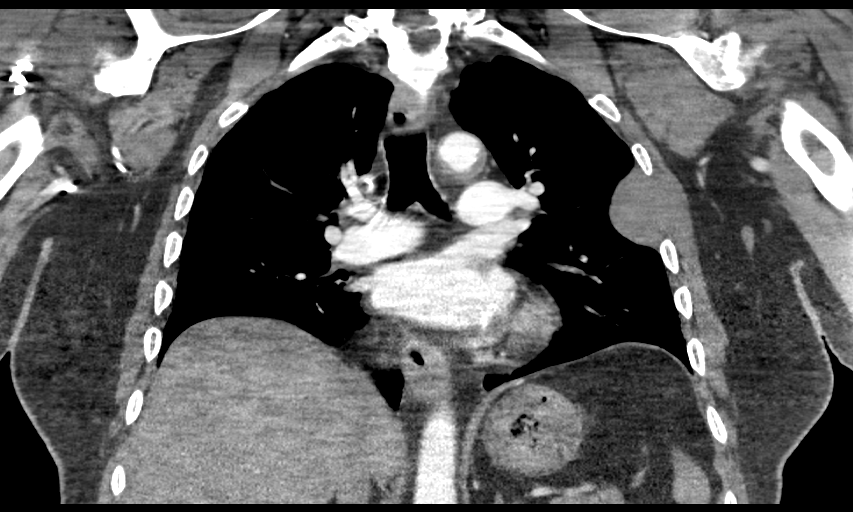
[im 76/151  bone]
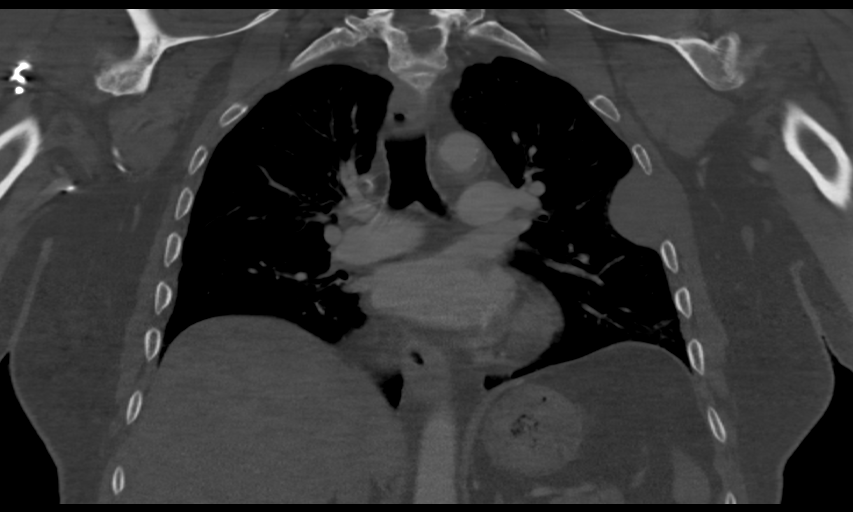

[Series 12: abd/pelvis thins · axial · 0.98mm/px · z∈[-780,-351]mm · 10 of 496 slices shown, 13 images]
[im 34/496  soft-tissue]
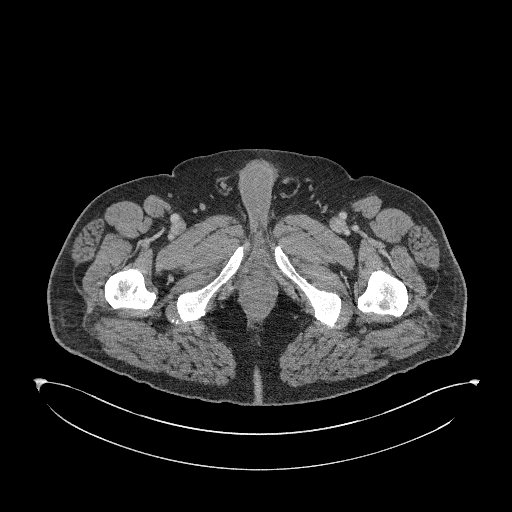
[im 34/496  bone]
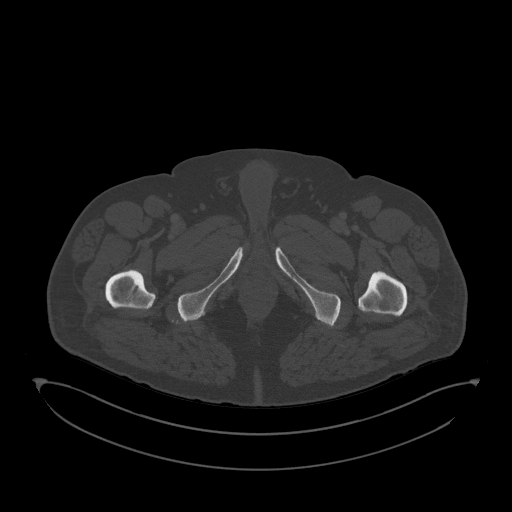
[im 100/496  soft-tissue]
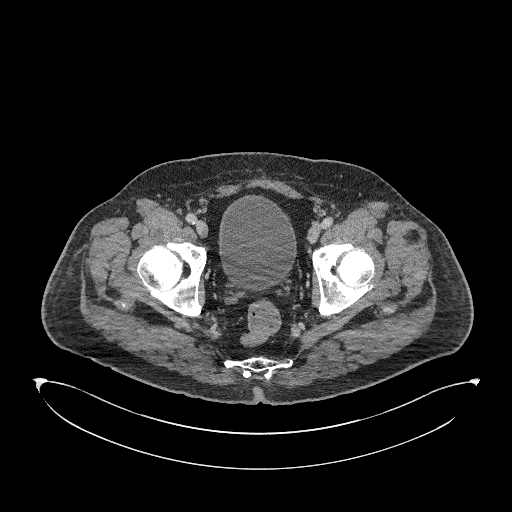
[im 166/496  soft-tissue]
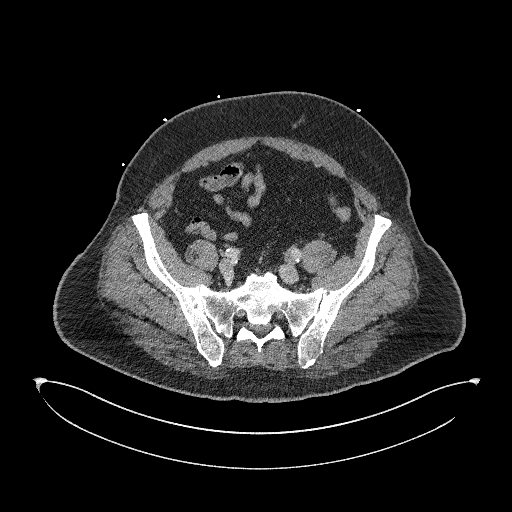
[im 232/496  soft-tissue]
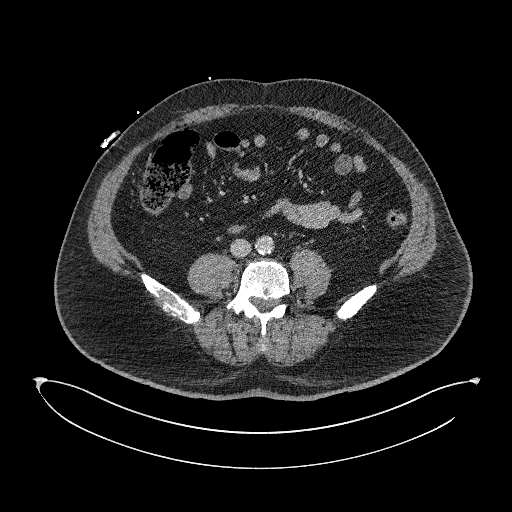
[im 265/496  soft-tissue]
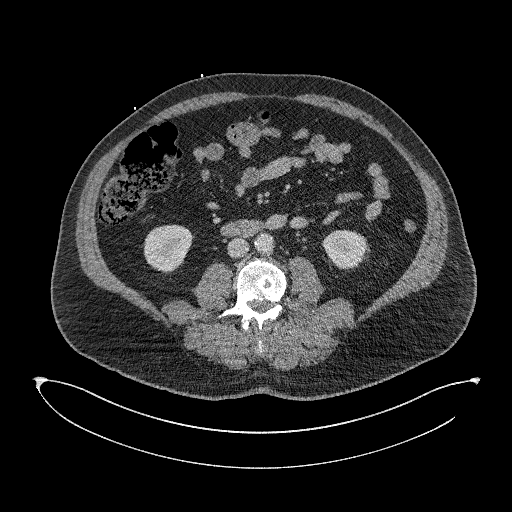
[im 331/496  soft-tissue]
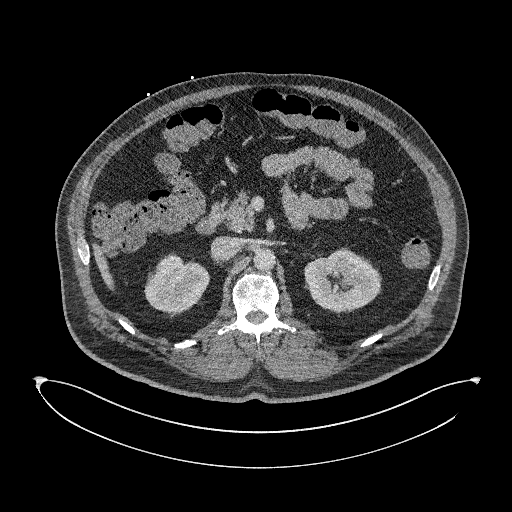
[im 364/496  lung]
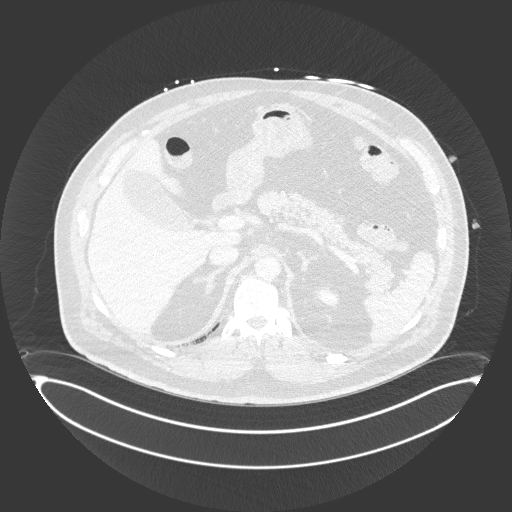
[im 397/496  soft-tissue]
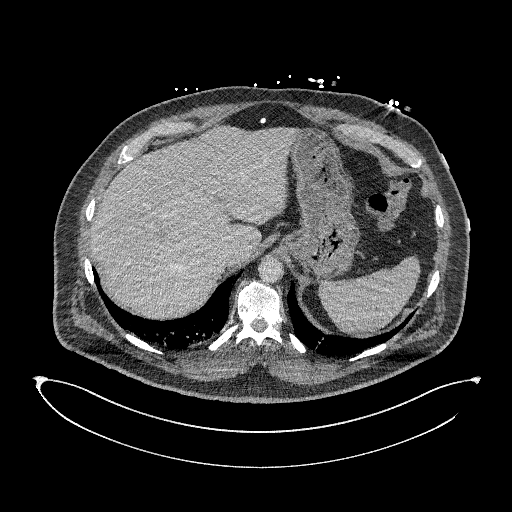
[im 397/496  lung]
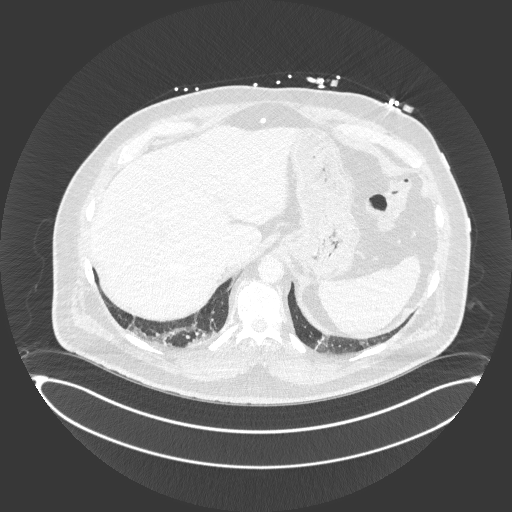
[im 430/496  lung]
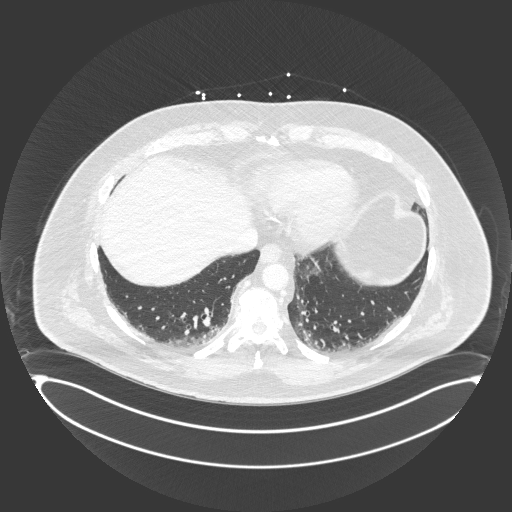
[im 463/496  soft-tissue]
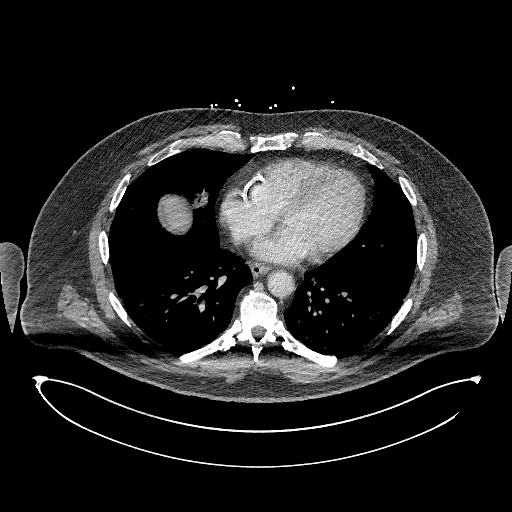
[im 463/496  lung]
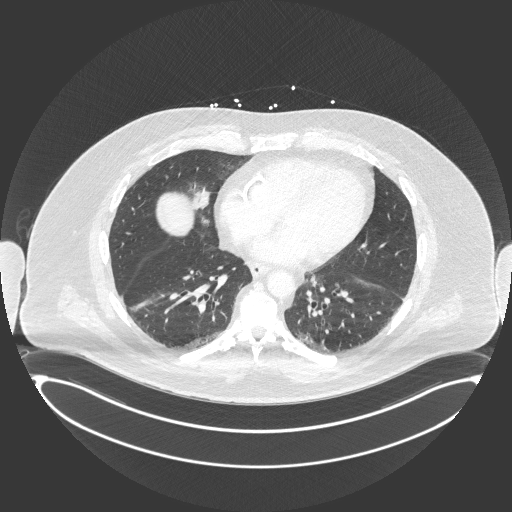

[11 of 46 positions shown; findings below may reference images not displayed]

FINDINGS: CTA CHEST FINDINGS

Cardiovascular: Is no filling defects in the pulmonary arteries to
suggest pulmonary emboli. Coronary artery calcifications diffusely.
Scattered aortic calcifications. No aneurysm. Heart is normal size.

Mediastinum/Nodes: No mediastinal, hilar, or axillary adenopathy.
Trachea and esophagus are unremarkable. Thyroid unremarkable.

Lungs/Pleura: Linear bibasilar subsegmental atelectasis or scarring.
No effusions.

Musculoskeletal: A lytic expansile lesion within the left scapula.
There is a fracture through the left scapula as well. There is a
lytic expansile lytic lesion within the lateral left 4th rib. Soft
tissue mass measures 6.4 x 4.0 cm. Lucent lesion seen within the T1
vertebral body. Multiple small lytic lesions noted throughout the
remainder of the bones including right scapula, bilateral ribs, T11.

Review of the MIP images confirms the above findings.

CT ABDOMEN and PELVIS FINDINGS

Hepatobiliary: No focal hepatic abnormality. Gallbladder
unremarkable.

Pancreas: No focal abnormality or ductal dilatation.

Spleen: No focal abnormality.  Normal size.

Adrenals/Urinary Tract: No adrenal abnormality. No focal renal
abnormality. No stones or hydronephrosis. Urinary bladder is
unremarkable. Small cyst in the lower pole of the right kidney.

Stomach/Bowel: Normal appendix. Stomach, large and small bowel
grossly unremarkable.

Vascular/Lymphatic: Aortic atherosclerosis. No evidence of aneurysm
or adenopathy.

Reproductive: Prostate enlargement.

Other: No free fluid or free air.

Musculoskeletal: Numerous lytic lesions throughout the lumbar spine
and bony pelvis.

Review of the MIP images confirms the above findings.
IMPRESSION: Numerous diffuse low-density lesions throughout the bony structures
of the chest abdomen or pelvis including bilateral scapula, multiple
ribs, thoracic spine, lumbar spine and bony pelvis. Findings are
concerning for metastasis or myeloma.

Fracture through the left scapula, possibly pathologic fracture with
the adjacent expansile lytic lesion in the left scapula.

Expansile lytic lesion with soft tissue mass in the left lateral 4th
rib.

No evidence of pulmonary embolus.

Coronary artery disease, aortic atherosclerosis.

Prostate enlargement.

## 2020-06-26 IMAGING — CT CT HEAD W/O CM
4 series · 15 of 47 positions shown, 17 images · non-contrast
Comparison: None.

CLINICAL DATA: Dizzy and fell into a door frame. Recent cervical
spine surgery.

EXAM:
CT HEAD WITHOUT CONTRAST
TECHNIQUE: Contiguous axial images were obtained from the base of the skull
through the vertex without intravenous contrast.

[Series 2: head wo · axial · 0.46mm/px · z∈[-362,-247]mm · 7 of 31 slices shown, 9 images]
[im 4/31  brain]
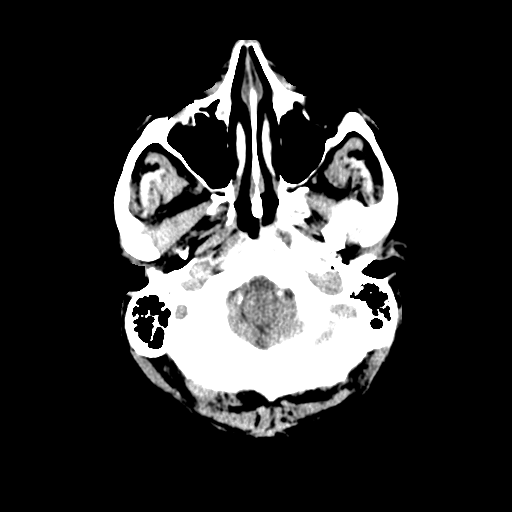
[im 4/31  bone]
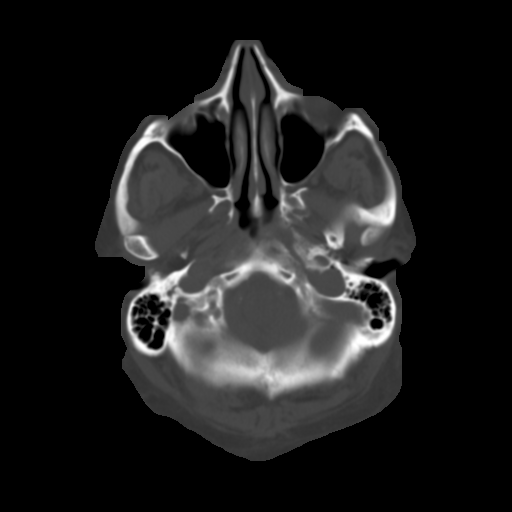
[im 8/31  brain]
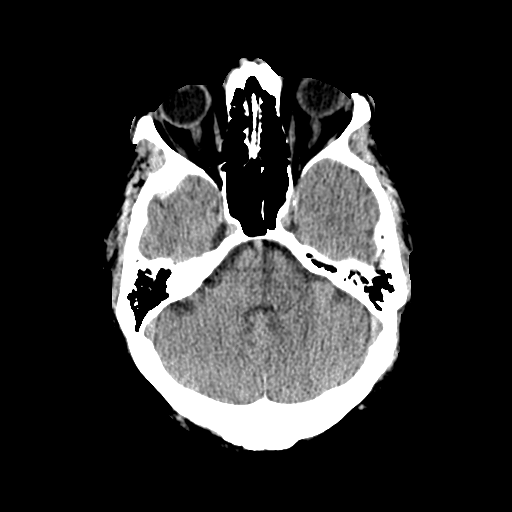
[im 12/31  brain]
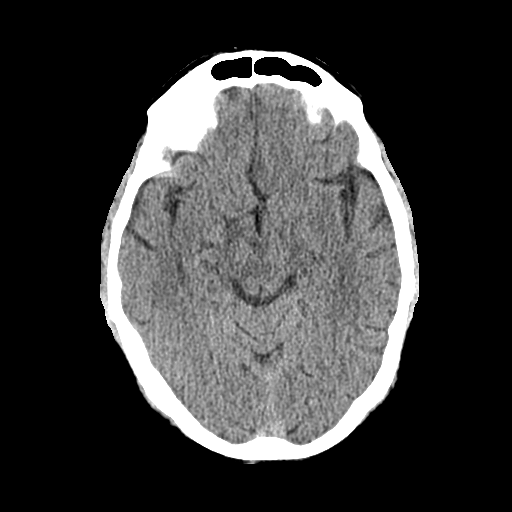
[im 16/31  brain]
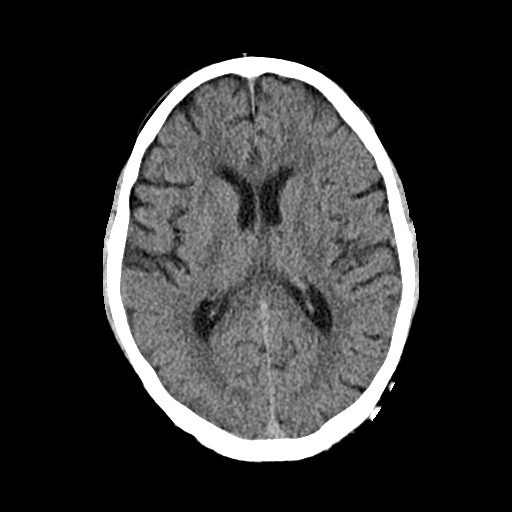
[im 19/31  brain]
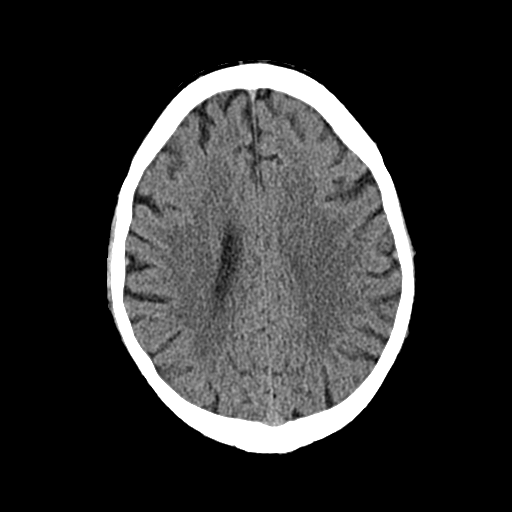
[im 19/31  bone]
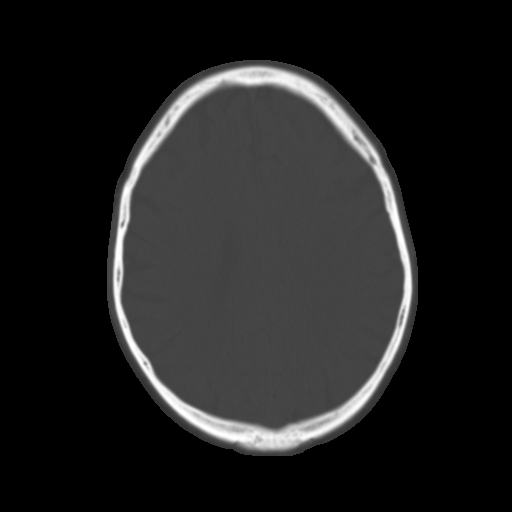
[im 23/31  brain]
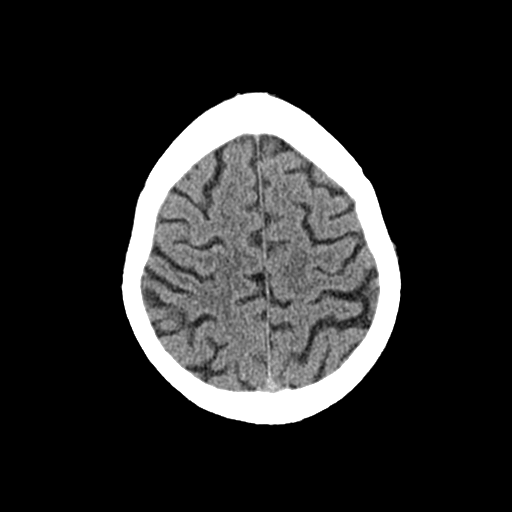
[im 27/31  brain]
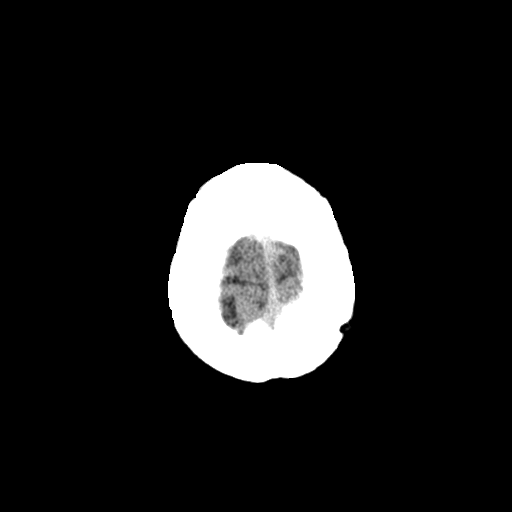

[Series 3: head bone · axial · 0.46mm/px · z∈[-363,-347]mm · 2 of 77 slices shown]
[im 8/77  bone]
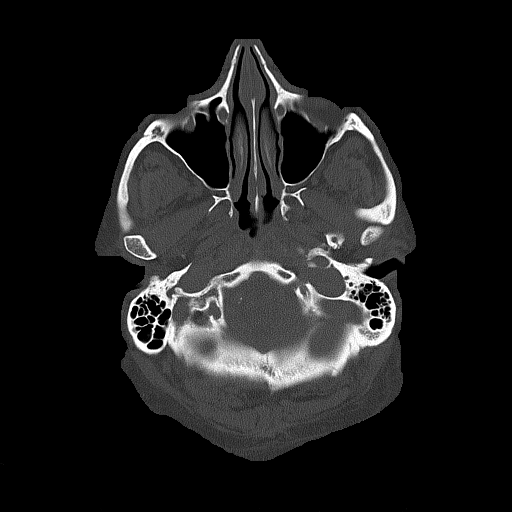
[im 16/77  bone]
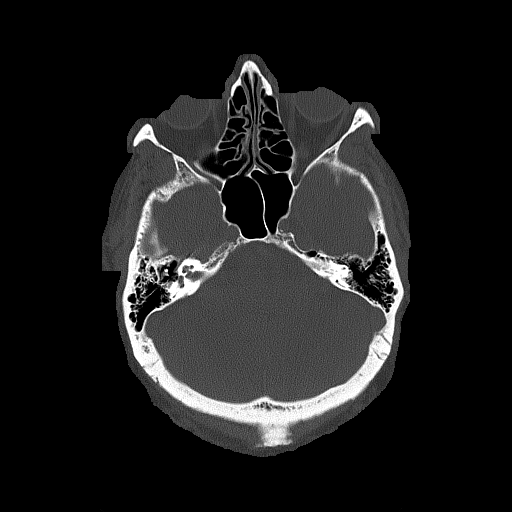

[Series 4: cor soft · coronal · 0.32mm/px · 3 of 84 slices shown]
[im 28/84  brain]
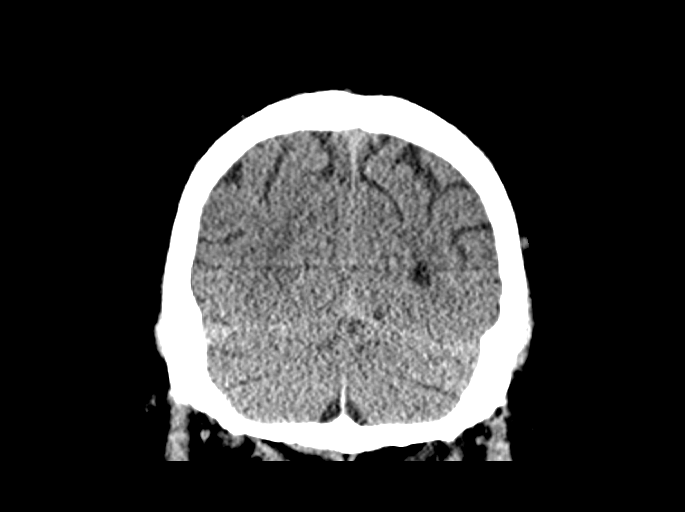
[im 37/84  brain]
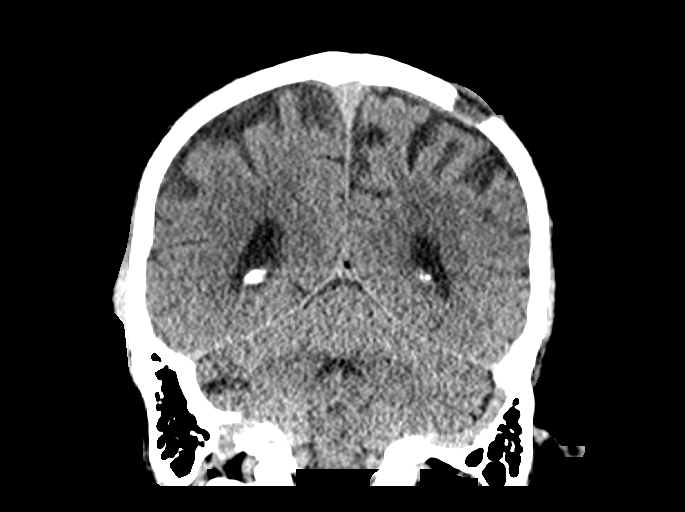
[im 47/84  brain]
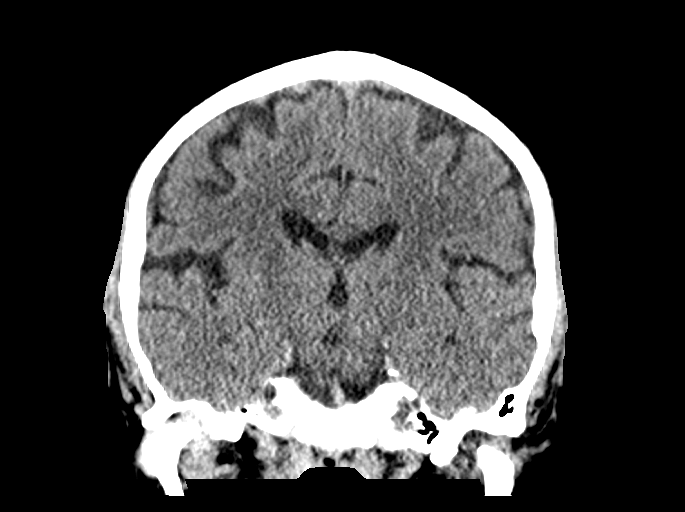

[Series 5: sag soft · sagittal · 0.32mm/px · 3 of 74 slices shown]
[im 25/74  brain]
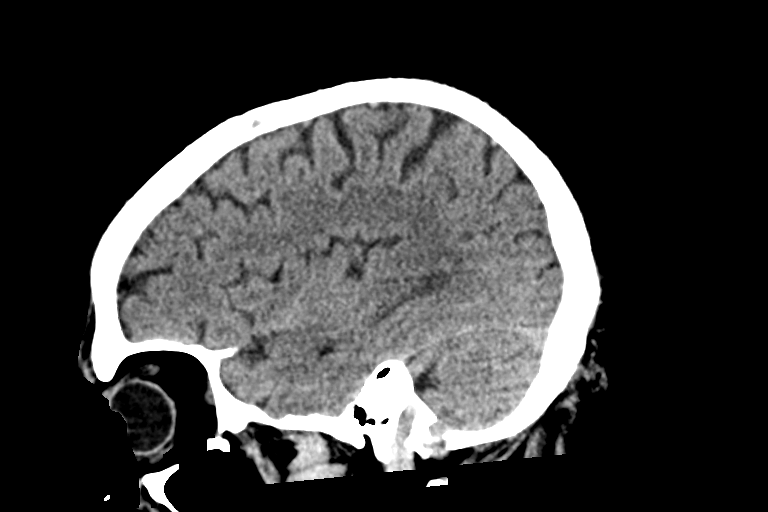
[im 37/74  brain]
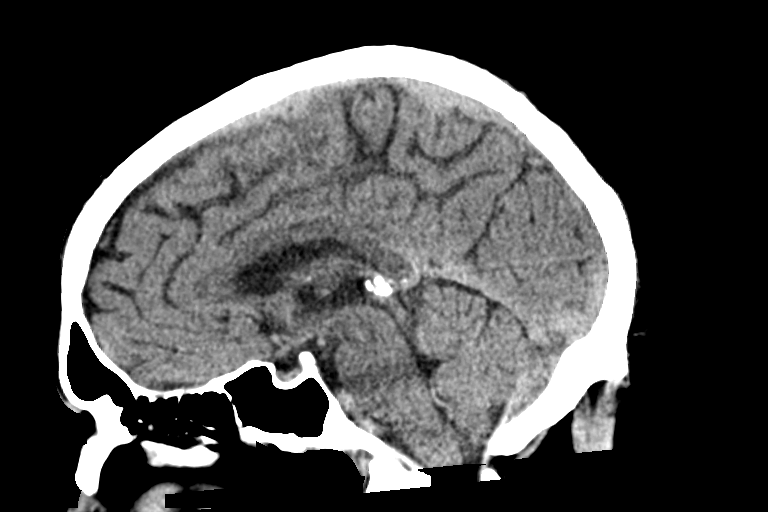
[im 49/74  brain]
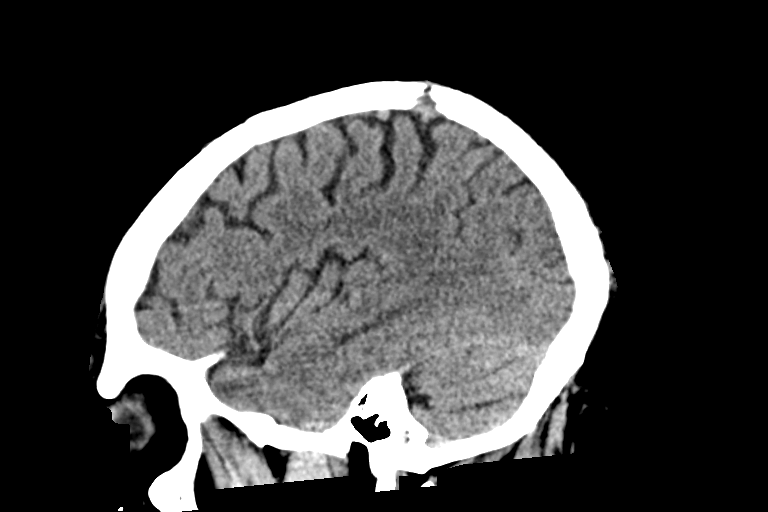

[15 of 47 positions shown; findings below may reference images not displayed]

FINDINGS: Brain: No evidence of acute infarction, hemorrhage, hydrocephalus,
extra-axial collection or mass lesion/mass effect.

Vascular: Calcified atherosclerosis at the skullbase. No hyperdense
vessel.

Skull: Negative for fracture. Osteolytic 1.3 x 1.8 x 1.1 cm
low-density lesion in the left parietal bone involving the inner and
outer tables (series 4, image 47). Additional smaller osteolytic
lesions left parietal bone (series 3, image 39), and right parietal
bone (series 3, image 62).

Sinuses/Orbits: No acute finding.

Other: None.
IMPRESSION: 1. No acute intracranial abnormality.
2. Osteolytic lesions in the left parietal bone and right parietal
bone, concerning for metastatic disease or multiple myeloma.

## 2020-06-26 IMAGING — CT CT CERVICAL SPINE W/O CM
3 of 4 series · 12 of 33 positions shown, 14 images · non-contrast
Comparison: MRI cervical spine [DATE]

CLINICAL DATA: ACDF cervical spine [DATE].  Fall today.

EXAM:
CT CERVICAL SPINE WITHOUT CONTRAST
TECHNIQUE: Multidetector CT imaging of the cervical spine was performed without
intravenous contrast. Multiplanar CT image reconstructions were also
generated.

[Series 5: sagittal bone · sagittal · 0.30mm/px · 5 of 61 slices shown, 6 images]
[im 21/61  bone]
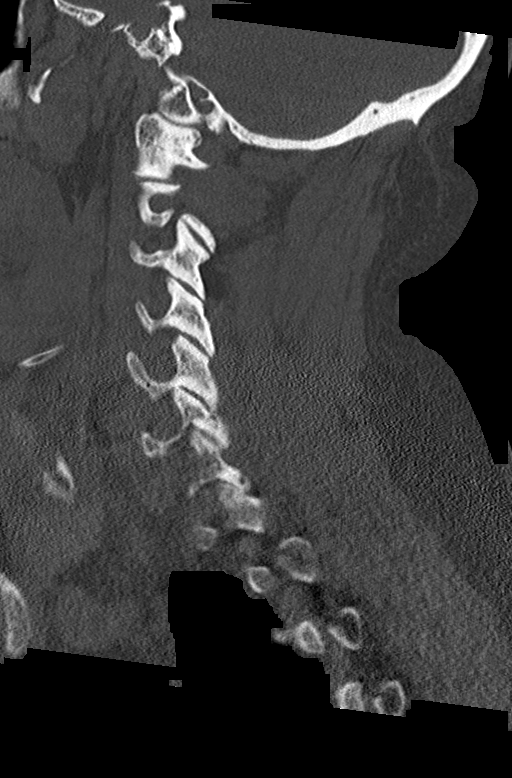
[im 26/61  bone]
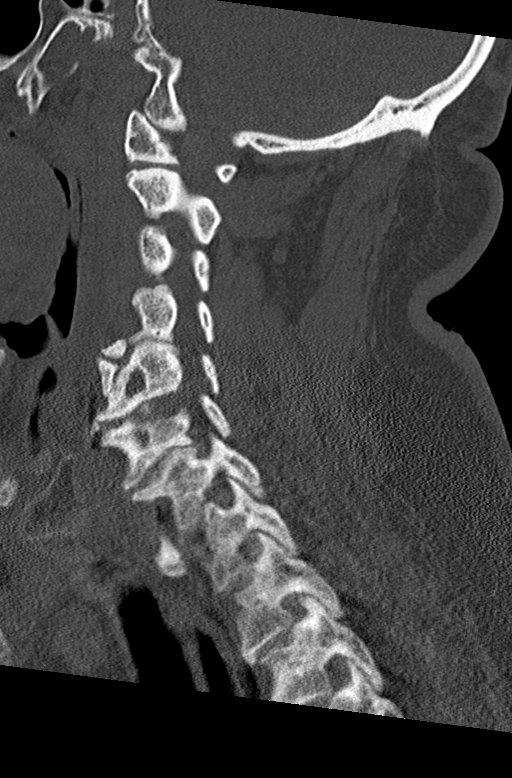
[im 31/61  soft-tissue]
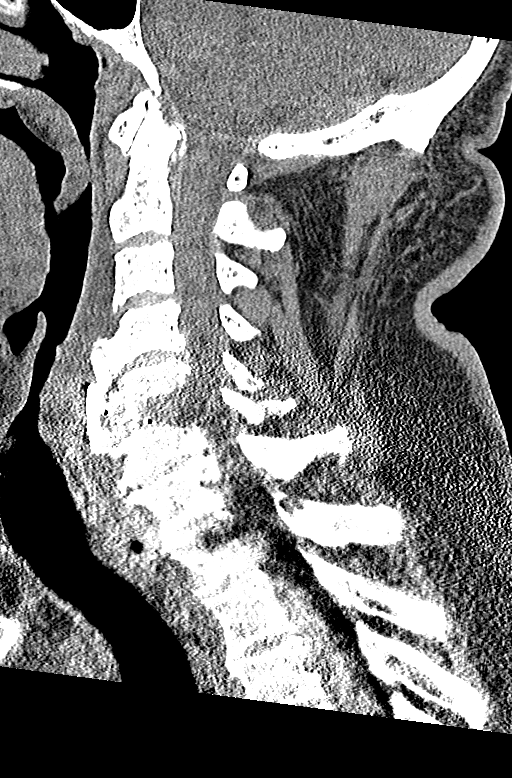
[im 31/61  bone]
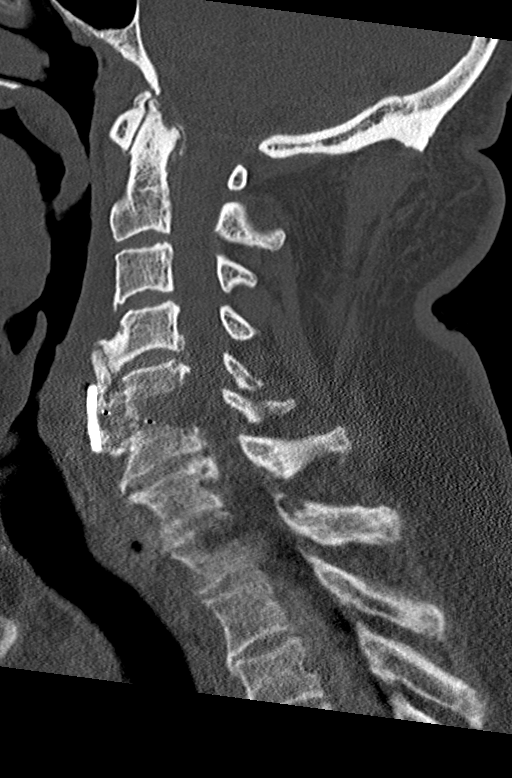
[im 36/61  bone]
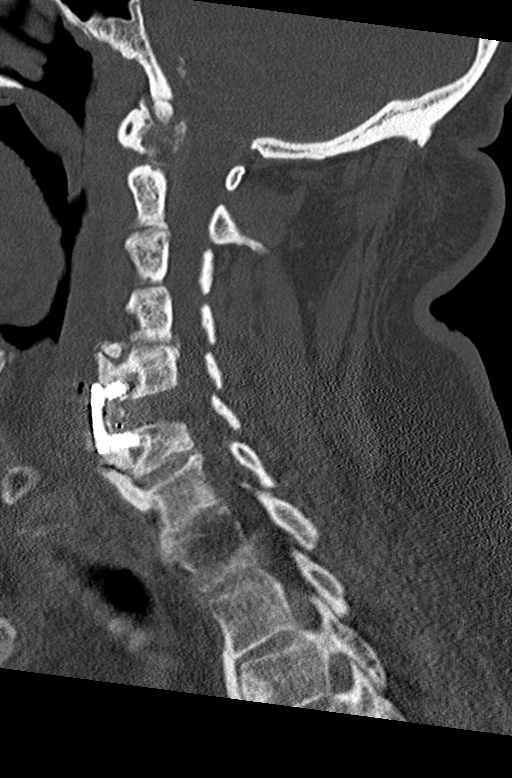
[im 41/61  bone]
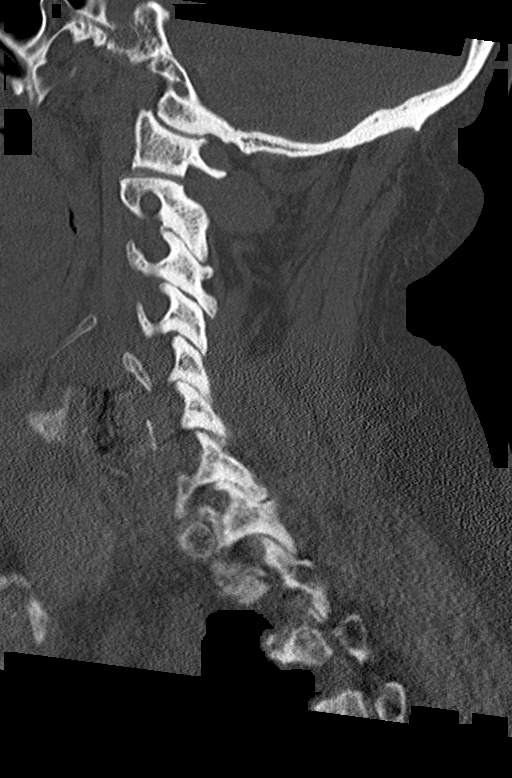

[Series 6: coronal bone · coronal · 0.30mm/px · 3 of 68 slices shown]
[im 14/68  bone]
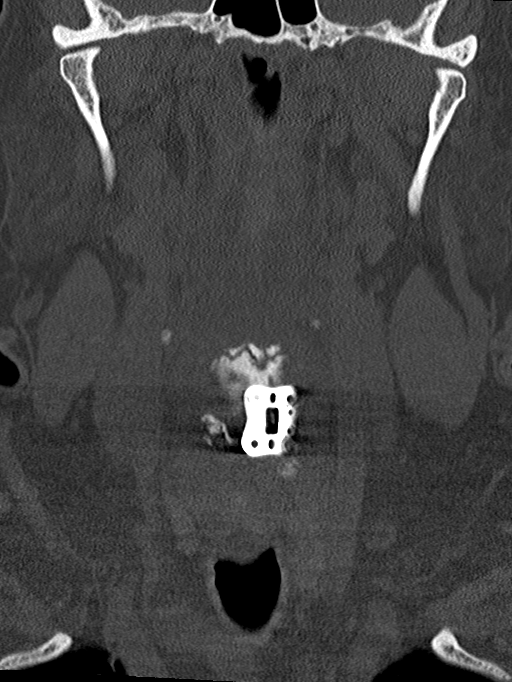
[im 27/68  bone]
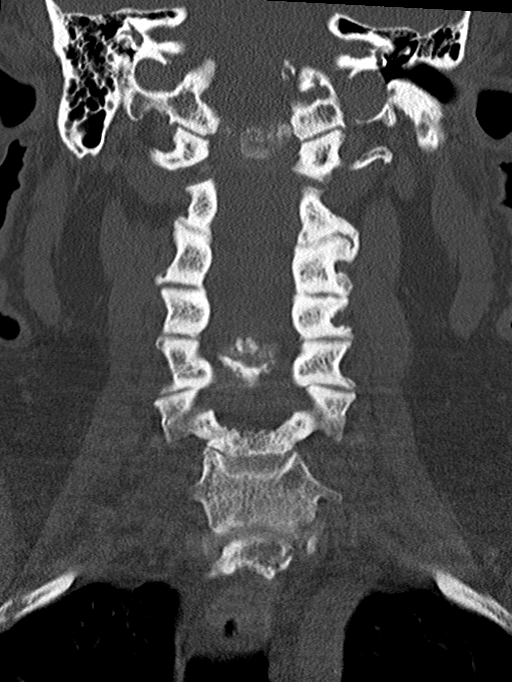
[im 41/68  bone]
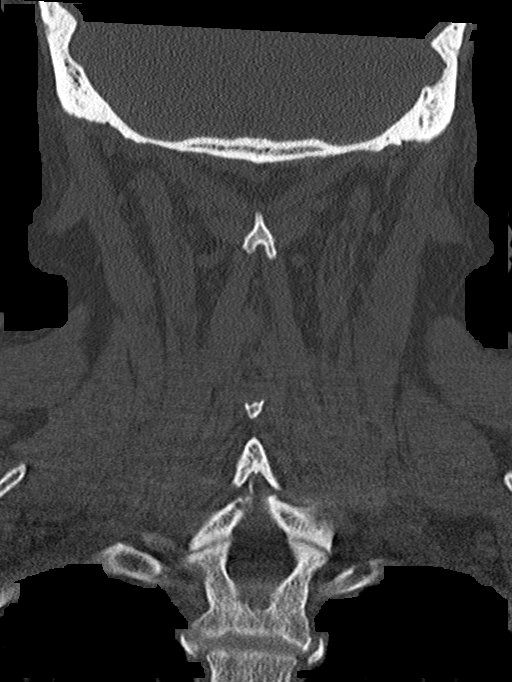

[Series 7: orthogonal bone · axial · 0.30mm/px · z∈[-203,-68]mm · 4 of 102 slices shown, 5 images]
[im 17/102  soft-tissue]
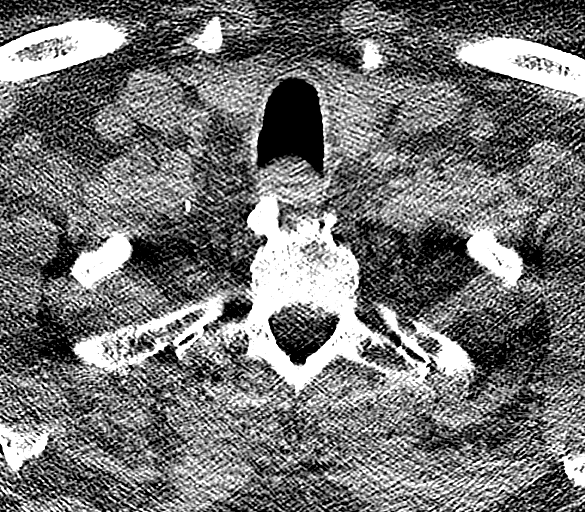
[im 17/102  bone]
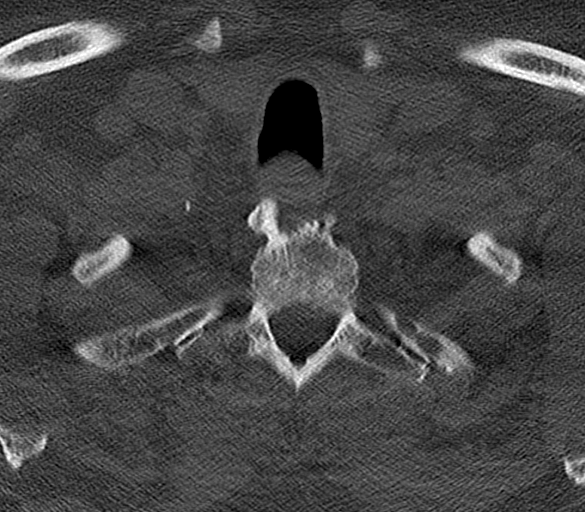
[im 34/102  bone]
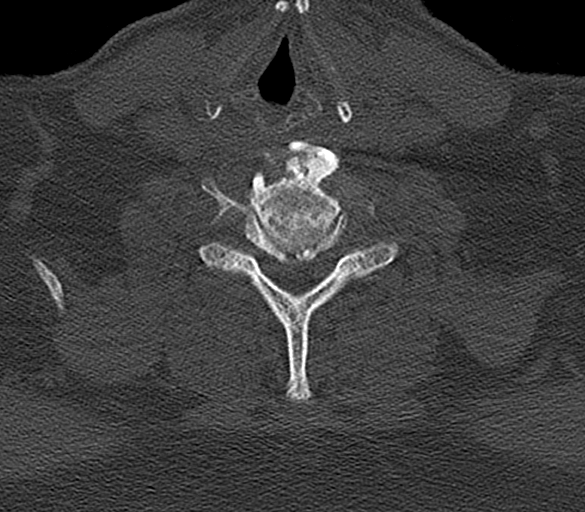
[im 68/102  bone]
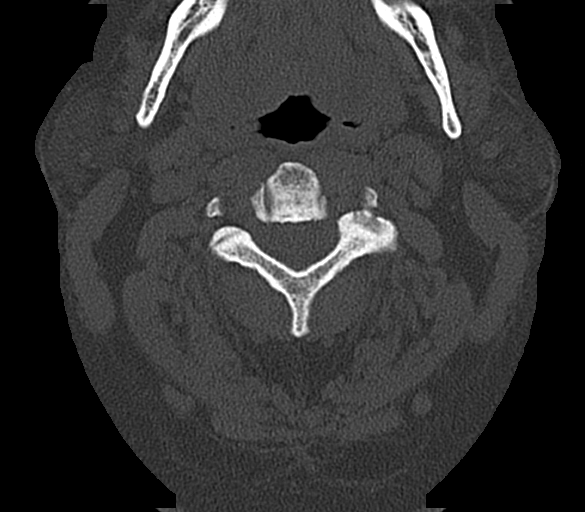
[im 85/102  bone]
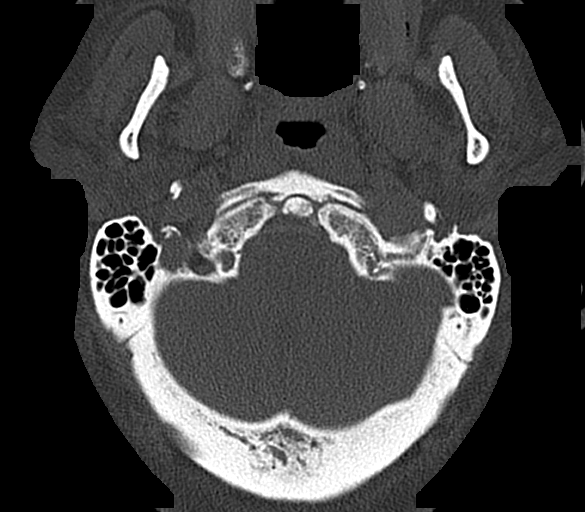

[12 of 33 positions shown; findings below may reference images not displayed]

FINDINGS: Alignment: Normal

Skull base and vertebrae: Negative for fracture

Lytic lesion T1 vertebral body measuring 14 x 18 mm. Non sclerotic
margins. No internal matrix. Lesion extends into the left pedicle.
This lesion is seen on the recent MRI and is low signal on T1 and
increased signal on T2 and STIR. Additional 5 x 7 mm lytic lesion in
the right lamina of T1.

Soft tissues and spinal canal: ACDF C5-6 from a left neck approach.
Mild soft tissue edema in the wound and around the left thyroid. No
hematoma. Mild prevertebral edema above the level fusion at C3 and
C4 without hematoma.

Disc levels: C2-3: Moderate facet degeneration on the left with mild
left foraminal narrowing. Small central disc protrusion.

C3-4: Small central disc protrusion. Left-sided facet degeneration.
Mild uncinate spurring without significant stenosis

C4-5: Disc degeneration with diffuse uncinate spurring. Mild spinal
stenosis. Neural foramina patent bilaterally

C5-6: ACDF. Prominent anterior osteophytes cause the plate to be
position anteriorly. No significant spinal stenosis. Neural foramina
patent.

C6-7: Disc degeneration with prominent central osteophyte. Mild
spinal stenosis and mild foraminal stenosis bilaterally due to
diffuse uncinate spurring

C7-T1: Negative for stenosis.

Upper chest: Visualized lung apices clear bilaterally.

Other: None
IMPRESSION: 1. Postop ACDF C5-6. Anterior plate and screws position anteriorly
due to prominent anterior osteophyte. Typical postsurgical edema in
the left neck. No hematoma or residual spinal stenosis
2. Cervical spondylosis with mild spinal stenosis at C4-5 and C6-7.
Mild foraminal stenosis bilaterally C6-7.
3. 14 x 18 mm lytic lesion vertebral body T1. This is also noted on
the prior MRI. A second small 5 x 7 mm lytic lesion in the right
lamina of T1. These are concerning for neoplasm. Possible myeloma or
metastatic disease. Follow-up MRI cervical spine without with
contrast recommended for further evaluation.

## 2020-06-26 MED ORDER — OXYCODONE HCL 10 MG PO TABS: 10.0000 mg | ORAL_TABLET | Freq: Four times a day (QID) | ORAL | 0 refills | Status: AC | PRN

## 2020-06-26 MED ORDER — SODIUM CHLORIDE 0.9 % IV BOLUS
500.0000 mL | Freq: Once | INTRAVENOUS | Status: AC
  Administered 2020-06-26: 500 mL via INTRAVENOUS

## 2020-06-26 MED ORDER — ONDANSETRON HCL 4 MG/2ML IJ SOLN
4.0000 mg | Freq: Once | INTRAMUSCULAR | Status: AC
  Administered 2020-06-26: 4 mg via INTRAVENOUS
  Filled 2020-06-26: qty 2

## 2020-06-26 MED ORDER — HYDROMORPHONE HCL 1 MG/ML IJ SOLN
1.0000 mg | Freq: Once | INTRAMUSCULAR | Status: AC
  Administered 2020-06-26: 1 mg via INTRAVENOUS
  Filled 2020-06-26: qty 1

## 2020-06-26 MED ORDER — IOHEXOL 350 MG/ML SOLN
100.0000 mL | Freq: Once | INTRAVENOUS | Status: AC | PRN
  Administered 2020-06-26: 100 mL via INTRAVENOUS

## 2020-06-26 MED ORDER — OXYCODONE HCL 5 MG PO TABS
10.0000 mg | ORAL_TABLET | Freq: Once | ORAL | Status: AC
  Administered 2020-06-26: 10 mg via ORAL
  Filled 2020-06-26: qty 2

## 2020-06-26 NOTE — ED Triage Notes (Addendum)
C/o fall x 1 hr ago multiple injuries , recent neck surgery, pt is blood thinner

## 2020-06-26 NOTE — Discharge Instructions (Addendum)
Follow up with Dr. [name], call on Monday to schedule an appointment. Follow up with orthopedics, referral given.  Wear shoulder immobilizer for comfort. Take oxycodone as needed as prescribed for pain. This can cause constipation, take Colace as needed as directed.

## 2020-06-26 NOTE — ED Notes (Signed)
Last NPO at 1330hrs for solids and liquids, will keep NPO until further notice

## 2020-06-26 NOTE — ED Notes (Signed)
EDP in to speak with pt and wife, pt placed on cont cardiac monitor with 12 lead ECG performed, placed on cont POX monitor and int NBP assessments

## 2020-06-26 NOTE — ED Notes (Signed)
Patient transported to CT 

## 2020-06-26 NOTE — ED Notes (Signed)
States pain has decreased post med administration, does not assign a number from pain scale, states that pain does return back to a 10 when moving the left arm, order rec for left arm sling/immobilizer, ice pack replaced to area with most pain.

## 2020-06-26 NOTE — ED Notes (Signed)
Positioned for comfort of pain at left shoulder, ice pack applied , sr x 2 up, wife at bedside, call bell within reach

## 2020-06-26 NOTE — ED Notes (Signed)
Had surgery, neck surgery, one week ago, anterior approach, procedure at [facility], has had a lot of pain since surgery, fell today at home, standing and leaned over to pick up papers and fell and hit the door with his face and fell onto rt shoulder, pt states he began feeling dizzy and then fell forward. Denies LOC, pt c/o of left shoulder pain, hurts when moving it, has strong left radial pulse with good capillary refill. Last pain med: 1 ibuprofen at 1130hrs, other meds, at 0830hrs took a oxycodone and muscle relaxants

## 2020-06-26 NOTE — ED Provider Notes (Signed)
[facility] Provider Note   CSN: [id number] Arrival date & time: 06/26/20  1514     History Chief Complaint  Patient presents with  . Fall    Bruce Smith is a 69 y.o. male.  69 year old male with history of MI (with stent, on Effient), hypertension, hyperlipidemia, brought in by wife from home for evaluation after a fall. Patient had C5-C6 anterior cervical decompression and discectomy fusion 06/17/20. Patient states he bent over to pick up a piece of paper and felt dizzy upon standing. Patient tried to keep himself from falling and ended up taking several steps forward and fell into a door. No loss of consciousness, primary injury to the left scapula. Also has abrasion to bridge of nose, skin tear right elbow. Patient states he has had a sharp pain in his right upper quadrant recently that is causing him significant discomfort. Pain located just below the right ribs, worse with taking a deep breath, no pain with eating. No prior abdominal surgeries, denies nausea, vomiting, changes in bowel or bladder habits. Denies pain in the neck.  No other complaints, concerns, injuries.         Past Medical History:  Diagnosis Date  . Atherosclerosis   . Hyperlipidemia   . Hypertension   . Myocardial infarction [facility])     Patient Active Problem List   Diagnosis Date Noted  . 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 ANTERIOR CERVICAL DECOMP/DISCECTOMY FUSION N/A 06/17/2020   Procedure: ANTERIOR CERVICAL DECOMPRESSION/DISCECTOMY FUSION, INTERBODY PROSTHESIS, PLATE/SCREWS CERVICAL FIVE- CERVICAL SIX;  Surgeon: Newman Pies, MD;  Location: [facility];  Service: Neurosurgery;  Laterality: N/A;  ANTERIOR CERVICAL DECOMPRESSION/DISCECTOMY FUSION, INTERBODY PROSTHESIS, PLATE/SCREWS CERVICAL FIVE- CERVICAL SIX  . CARDIAC CATHETERIZATION         No family history on file.  Social  History   Tobacco Use  . Smoking status: Former Smoker    Quit date: 2000    Years since quitting: 21.6  . Smokeless tobacco: Current User  Vaping Use  . Vaping Use: Never used  Substance Use Topics  . Alcohol use: Not on file    Comment: rare  . Drug use: Never    Home Medications Prior to Admission medications   Medication Sig Start Date End Date Taking? Authorizing Provider  aspirin EC 81 MG tablet Take 81 mg by mouth daily. Swallow whole.    [provider]  atorvastatin (LIPITOR) 20 MG tablet Take 20 mg by mouth daily.    [provider]  Coenzyme Q10 (CO Q-10) 200 MG CAPS Take 200 mg by mouth daily.    [provider]  cyclobenzaprine (FLEXERIL) 10 MG tablet Take 1 tablet (10 mg total) by mouth 3 (three) times daily as needed for muscle spasms. 06/18/20   Viona Gilmore D, NP  docusate sodium (COLACE) 100 MG capsule Take 1 capsule (100 mg total) by mouth 2 (two) times daily. 06/18/20   Viona Gilmore D, NP  lisinopril (ZESTRIL) 5 MG tablet Take 7.5 mg by mouth daily. 03/10/20   [provider]  metoprolol succinate (TOPROL-XL) 50 MG 24 hr tablet Take 50 mg by mouth daily. 03/10/20   [provider]  nitroGLYCERIN (NITROSTAT) 0.4 MG SL tablet Place 0.4 mg under the tongue every 5 (five) minutes as needed for chest pain.    [provider]  oxyCODONE 10 MG TABS Take 1 tablet (10 mg total)  by mouth every 6 (six) hours as needed for up to 5 days for severe pain. 06/26/20 07/01/20  Tacy Learn, PA-C  oxyCODONE-acetaminophen (PERCOCET) 10-325 MG tablet Take 1 tablet by mouth every 4 (four) hours as needed for pain. 06/18/20   Viona Gilmore D, NP  prasugrel (EFFIENT) 10 MG TABS tablet Take 10 mg by mouth daily. 05/12/20   [provider]  pregabalin (LYRICA) 150 MG capsule Take 150 mg by mouth in the morning and at bedtime. 06/02/20   [provider]    Allergies    Patient has no known allergies.  Review of  Systems   Review of Systems  Constitutional: Negative for chills and fever.  Respiratory: Negative for shortness of breath.   Cardiovascular: Negative for chest pain.  Gastrointestinal: Positive for abdominal pain. Negative for constipation, diarrhea, nausea and vomiting.  Musculoskeletal: Positive for arthralgias. Negative for gait problem, joint swelling and neck pain.  Skin: Positive for wound.  Allergic/Immunologic: Negative for immunocompromised state.  Neurological: Positive for dizziness. Negative for weakness and headaches.  Hematological: Bruises/bleeds easily.  Psychiatric/Behavioral: Negative for confusion.  All other systems reviewed and are negative.   Physical Exam Updated Vital Signs BP (!) 142/62 (BP Location: Right Arm)   Pulse 68   Temp 98.1 F (36.7 C)   Resp 14   Ht '5\' 8"'  (1.727 m)   Wt 102.1 kg   SpO2 94%   BMI 34.21 kg/m   Physical Exam Vitals and nursing note reviewed.  Constitutional:      General: He is not in acute distress.    Appearance: He is well-developed. He is not diaphoretic.  HENT:     Head: Normocephalic and atraumatic.     Mouth/Throat:     Mouth: Mucous membranes are dry.  Eyes:     Extraocular Movements: Extraocular movements intact.     Conjunctiva/sclera: Conjunctivae normal.     Pupils: Pupils are equal, round, and reactive to light.  Cardiovascular:     Rate and Rhythm: Normal rate and regular rhythm.     Pulses: Normal pulses.     Heart sounds: Normal heart sounds.  Pulmonary:     Effort: Pulmonary effort is normal.     Breath sounds: Normal breath sounds.  Abdominal:     Palpations: Abdomen is soft.     Tenderness: There is abdominal tenderness in the right upper quadrant.    Musculoskeletal:     Right lower leg: No edema.     Left lower leg: No edema.     Comments: Known left scapula fracture, left shoulder ROM significantly limited due to pain. No pain with palpation left humerus/elbow/wrist  Skin:    General:  Skin is warm and dry.     Findings: No erythema or rash.  Neurological:     Mental Status: He is alert and oriented to person, place, and time.     Sensory: No sensory deficit.  Psychiatric:        Behavior: Behavior normal.     ED Results / Procedures / Treatments   Labs (all labs ordered are listed, but only abnormal results are displayed) Labs Reviewed  COMPREHENSIVE METABOLIC PANEL - Abnormal; Notable for the following components:      Result Value   Glucose, Bld 131 (*)    Calcium 11.1 (*)    Alkaline Phosphatase 130 (*)    All other components within normal limits  CBC WITH DIFFERENTIAL/PLATELET - Abnormal; Notable for the following components:  WBC 13.4 (*)    Neutro Abs 8.8 (*)    Monocytes Absolute 1.3 (*)    Abs Immature Granulocytes 0.12 (*)    All other components within normal limits  LIPASE, BLOOD  PSA  PSA, TOTAL AND FREE  LACTATE DEHYDROGENASE  CEA    EKG None  Radiology CT Head Wo Contrast  Result Date: 06/26/2020 CLINICAL DATA:  Dizzy and fell into a door frame. Recent cervical spine surgery. EXAM: CT HEAD WITHOUT CONTRAST TECHNIQUE: Contiguous axial images were obtained from the base of the skull through the vertex without intravenous contrast. COMPARISON:  None. FINDINGS: Brain: No evidence of acute infarction, hemorrhage, hydrocephalus, extra-axial collection or mass lesion/mass effect. Vascular: Calcified atherosclerosis at the skullbase. No hyperdense vessel. Skull: Negative for fracture. Osteolytic 1.3 x 1.8 x 1.1 cm low-density lesion in the left parietal bone involving the inner and outer tables (series 4, image 47). Additional smaller osteolytic lesions left parietal bone (series 3, image 39), and right parietal bone (series 3, image 62). Sinuses/Orbits: No acute finding. Other: None. IMPRESSION: 1. No acute intracranial abnormality. 2. Osteolytic lesions in the left parietal bone and right parietal bone, concerning for metastatic disease or multiple  myeloma. Electronically Signed   By: Titus Dubin M.D.   On: 06/26/2020 16:36   CT Angio Chest PE W/Cm &/Or Wo Cm  Result Date: 06/26/2020 CLINICAL DATA:  Dizziness. Recent cervical spine surgery. Fall, scapular fracture. EXAM: CT ANGIOGRAPHY CHEST CT ABDOMEN AND PELVIS WITH CONTRAST TECHNIQUE: Multidetector CT imaging of the chest was performed using the standard protocol during bolus administration of intravenous contrast. Multiplanar CT image reconstructions and MIPs were obtained to evaluate the vascular anatomy. Multidetector CT imaging of the abdomen and pelvis was performed using the standard protocol during bolus administration of intravenous contrast. CONTRAST:  18m OMNIPAQUE IOHEXOL 350 MG/ML SOLN COMPARISON:  Left shoulder series earlier today. FINDINGS: CTA CHEST FINDINGS Cardiovascular: Is no filling defects in the pulmonary arteries to suggest pulmonary emboli. Coronary artery calcifications diffusely. Scattered aortic calcifications. No aneurysm. Heart is normal size. Mediastinum/Nodes: No mediastinal, hilar, or axillary adenopathy. Trachea and esophagus are unremarkable. Thyroid unremarkable. Lungs/Pleura: Linear bibasilar subsegmental atelectasis or scarring. No effusions. Musculoskeletal: A lytic expansile lesion within the left scapula. There is a fracture through the left scapula as well. There is a lytic expansile lytic lesion within the lateral left 4th rib. Soft tissue mass measures 6.4 x 4.0 cm. Lucent lesion seen within the T1 vertebral body. Multiple small lytic lesions noted throughout the remainder of the bones including right scapula, bilateral ribs, T11. Review of the MIP images confirms the above findings. CT ABDOMEN and PELVIS FINDINGS Hepatobiliary: No focal hepatic abnormality. Gallbladder unremarkable. Pancreas: No focal abnormality or ductal dilatation. Spleen: No focal abnormality.  Normal size. Adrenals/Urinary Tract: No adrenal abnormality. No focal renal abnormality.  No stones or hydronephrosis. Urinary bladder is unremarkable. Small cyst in the lower pole of the right kidney. Stomach/Bowel: Normal appendix. Stomach, large and small bowel grossly unremarkable. Vascular/Lymphatic: Aortic atherosclerosis. No evidence of aneurysm or adenopathy. Reproductive: Prostate enlargement. Other: No free fluid or free air. Musculoskeletal: Numerous lytic lesions throughout the lumbar spine and bony pelvis. Review of the MIP images confirms the above findings. IMPRESSION: Numerous diffuse low-density lesions throughout the bony structures of the chest abdomen or pelvis including bilateral scapula, multiple ribs, thoracic spine, lumbar spine and bony pelvis. Findings are concerning for metastasis or myeloma. Fracture through the left scapula, possibly pathologic fracture with the adjacent expansile lytic  lesion in the left scapula. Expansile lytic lesion with soft tissue mass in the left lateral 4th rib. No evidence of pulmonary embolus. Coronary artery disease, aortic atherosclerosis. Prostate enlargement. Electronically Signed   By: Rolm Baptise M.D.   On: 06/26/2020 18:34   CT Cervical Spine Wo Contrast  Result Date: 06/26/2020 CLINICAL DATA:  ACDF cervical spine 06/17/2020.  Fall today. EXAM: CT CERVICAL SPINE WITHOUT CONTRAST TECHNIQUE: Multidetector CT imaging of the cervical spine was performed without intravenous contrast. Multiplanar CT image reconstructions were also generated. COMPARISON:  MRI cervical spine 05/28/2020 FINDINGS: Alignment: Normal Skull base and vertebrae: Negative for fracture Lytic lesion T1 vertebral body measuring 14 x 18 mm. Non sclerotic margins. No internal matrix. Lesion extends into the left pedicle. This lesion is seen on the recent MRI and is low signal on T1 and increased signal on T2 and STIR. Additional 5 x 7 mm lytic lesion in the right lamina of T1. Soft tissues and spinal canal: ACDF C5-6 from a left neck approach. Mild soft tissue edema in the  wound and around the left thyroid. No hematoma. Mild prevertebral edema above the level fusion at C3 and C4 without hematoma. Disc levels: C2-3: Moderate facet degeneration on the left with mild left foraminal narrowing. Small central disc protrusion. C3-4: Small central disc protrusion. Left-sided facet degeneration. Mild uncinate spurring without significant stenosis C4-5: Disc degeneration with diffuse uncinate spurring. Mild spinal stenosis. Neural foramina patent bilaterally C5-6: ACDF. Prominent anterior osteophytes cause the plate to be position anteriorly. No significant spinal stenosis. Neural foramina patent. C6-7: Disc degeneration with prominent central osteophyte. Mild spinal stenosis and mild foraminal stenosis bilaterally due to diffuse uncinate spurring C7-T1: Negative for stenosis. Upper chest: Visualized lung apices clear bilaterally. Other: None IMPRESSION: 1. Postop ACDF C5-6. Anterior plate and screws position anteriorly due to prominent anterior osteophyte. Typical postsurgical edema in the left neck. No hematoma or residual spinal stenosis 2. Cervical spondylosis with mild spinal stenosis at C4-5 and C6-7. Mild foraminal stenosis bilaterally C6-7. 3. 14 x 18 mm lytic lesion vertebral body T1. This is also noted on the prior MRI. A second small 5 x 7 mm lytic lesion in the right lamina of T1. These are concerning for neoplasm. Possible myeloma or metastatic disease. Follow-up MRI cervical spine without with contrast recommended for further evaluation. Electronically Signed   By: Franchot Gallo M.D.   On: 06/26/2020 18:48   CT Abdomen Pelvis W Contrast  Result Date: 06/26/2020 CLINICAL DATA:  Dizziness. Recent cervical spine surgery. Fall, scapular fracture. EXAM: CT ANGIOGRAPHY CHEST CT ABDOMEN AND PELVIS WITH CONTRAST TECHNIQUE: Multidetector CT imaging of the chest was performed using the standard protocol during bolus administration of intravenous contrast. Multiplanar CT image  reconstructions and MIPs were obtained to evaluate the vascular anatomy. Multidetector CT imaging of the abdomen and pelvis was performed using the standard protocol during bolus administration of intravenous contrast. CONTRAST:  121m OMNIPAQUE IOHEXOL 350 MG/ML SOLN COMPARISON:  Left shoulder series earlier today. FINDINGS: CTA CHEST FINDINGS Cardiovascular: Is no filling defects in the pulmonary arteries to suggest pulmonary emboli. Coronary artery calcifications diffusely. Scattered aortic calcifications. No aneurysm. Heart is normal size. Mediastinum/Nodes: No mediastinal, hilar, or axillary adenopathy. Trachea and esophagus are unremarkable. Thyroid unremarkable. Lungs/Pleura: Linear bibasilar subsegmental atelectasis or scarring. No effusions. Musculoskeletal: A lytic expansile lesion within the left scapula. There is a fracture through the left scapula as well. There is a lytic expansile lytic lesion within the lateral left 4th rib. Soft  tissue mass measures 6.4 x 4.0 cm. Lucent lesion seen within the T1 vertebral body. Multiple small lytic lesions noted throughout the remainder of the bones including right scapula, bilateral ribs, T11. Review of the MIP images confirms the above findings. CT ABDOMEN and PELVIS FINDINGS Hepatobiliary: No focal hepatic abnormality. Gallbladder unremarkable. Pancreas: No focal abnormality or ductal dilatation. Spleen: No focal abnormality.  Normal size. Adrenals/Urinary Tract: No adrenal abnormality. No focal renal abnormality. No stones or hydronephrosis. Urinary bladder is unremarkable. Small cyst in the lower pole of the right kidney. Stomach/Bowel: Normal appendix. Stomach, large and small bowel grossly unremarkable. Vascular/Lymphatic: Aortic atherosclerosis. No evidence of aneurysm or adenopathy. Reproductive: Prostate enlargement. Other: No free fluid or free air. Musculoskeletal: Numerous lytic lesions throughout the lumbar spine and bony pelvis. Review of the MIP  images confirms the above findings. IMPRESSION: Numerous diffuse low-density lesions throughout the bony structures of the chest abdomen or pelvis including bilateral scapula, multiple ribs, thoracic spine, lumbar spine and bony pelvis. Findings are concerning for metastasis or myeloma. Fracture through the left scapula, possibly pathologic fracture with the adjacent expansile lytic lesion in the left scapula. Expansile lytic lesion with soft tissue mass in the left lateral 4th rib. No evidence of pulmonary embolus. Coronary artery disease, aortic atherosclerosis. Prostate enlargement. Electronically Signed   By: Rolm Baptise M.D.   On: 06/26/2020 18:34   DG Shoulder Left  Result Date: 06/26/2020 CLINICAL DATA:  Recent fall with left shoulder pain, initial encounter EXAM: LEFT SHOULDER - 2+ VIEW COMPARISON:  None. FINDINGS: Degenerative changes of the acromioclavicular joint are noted. Mild glenohumeral degenerative changes are seen as well. Comminuted scapular fracture is noted in the mid body. Additionally there is an erosive lesion involving the fourth left rib posterolaterally likely representing metastatic disease. Lucencies are noted within the proximal humeral shaft which are better visualized than that seen on prior exam also likely representing metastatic disease. CT of the chest is recommended for further evaluation. IMPRESSION: Comminuted fracture of the scapula. Given the findings described below this is felt to be a pathologic fracture. Multiple lucent lesions within the proximal left humerus consistent with metastatic disease. Erosive changes of the fourth left rib laterally with underlying soft tissue mass consistent with metastatic disease. CT of the chest is recommended for further evaluation. Electronically Signed   By: Inez Catalina M.D.   On: 06/26/2020 16:14    Procedures Procedures (including critical care time)  Medications Ordered in ED Medications  HYDROmorphone (DILAUDID)  injection 1 mg (1 mg Intravenous Given 06/26/20 1646)  ondansetron (ZOFRAN) injection 4 mg (4 mg Intravenous Given 06/26/20 1645)  sodium chloride 0.9 % bolus 500 mL ( Intravenous Stopped 06/26/20 1757)  iohexol (OMNIPAQUE) 350 MG/ML injection 100 mL (100 mLs Intravenous Contrast Given 06/26/20 1806)    ED Course  I have reviewed the triage vital signs and the nursing notes.  Pertinent labs & imaging results that were available during my care of the patient were reviewed by me and considered in my medical decision making (see chart for details).  Clinical Course as of Jun 26 1928  Fri Jun 26, 3228  4731 69 year old male presents for evaluation after a fall today as above.  CT head and x-ray of left shoulder obtained prior to evaluation showing comminuted fracture of the scapula with concern for multiple lucent lesions, recommend CT chest.    [LM]  1924 CT head showing no acute intracranial abnormality.  Does show osteolytic lesions in the left parietal bone  and right parietal bone concerning for metastatic disease or multiple myeloma.  Follow-up CT chest abdomen and pelvis consistent with multiple lytic lesions of the T-spine, L-spine, ribs as documented in full report.  Findings concerning for pathologic fracture of the scapula. Labs with my leukocytosis of 13.4, CMP with mild increase in calcium at 11.1 alk phos of 130.  Case discussed with Dr. [name] with oncology who will follow up on patient, requests add on PSA, LDH and CEA.  Results discussed with patient and wife as well as with family via telephone at patient's request.  Plan is to refer to oncology for follow-up as well as orthopedics for his scapula fracture.  Patient was given prescription for oxycodone to take as needed as directed for pain.  Also placed in a shoulder immobilizer for the left scapula fracture.   [LM]    Clinical Course User Index [LM] Roque Lias   MDM Rules/Calculators/A&P                           Final Clinical Impression(s) / ED Diagnoses Final diagnoses:  Fall, initial encounter  Closed displaced fracture of body of left scapula, initial encounter  Lytic lesion of bone on x-ray    Rx / DC Orders ED Discharge Orders         Ordered    oxyCODONE 10 MG TABS  Every 6 hours PRN        06/26/20 1918           Tacy Learn, PA-C 06/26/20 1929    Maudie Flakes, MD 06/26/20 2311

## 2020-06-28 LAB — PSA, TOTAL AND FREE
PSA, Free Pct: 23.8 %
PSA, Free: 0.88 ng/mL
Prostate Specific Ag, Serum: 3.7 ng/mL (ref 0.0–4.0)

## 2020-06-28 LAB — CEA: CEA: 0.9 ng/mL (ref 0.0–4.7)

## 2020-06-29 ENCOUNTER — Other Ambulatory Visit: Payer: Self-pay

## 2020-06-29 ENCOUNTER — Encounter: Payer: Self-pay | Admitting: Hematology & Oncology

## 2020-06-29 ENCOUNTER — Inpatient Hospital Stay (HOSPITAL_BASED_OUTPATIENT_CLINIC_OR_DEPARTMENT_OTHER): Payer: BC Managed Care – PPO | Admitting: Hematology & Oncology

## 2020-06-29 ENCOUNTER — Inpatient Hospital Stay: Payer: BC Managed Care – PPO | Attending: Hematology & Oncology

## 2020-06-29 VITALS — BP 130/56 | HR 58 | Temp 98.9°F | Resp 16 | Ht 68.0 in | Wt 226.0 lb

## 2020-06-29 DIAGNOSIS — Z7189 Other specified counseling: Secondary | ICD-10-CM

## 2020-06-29 DIAGNOSIS — E785 Hyperlipidemia, unspecified: Secondary | ICD-10-CM | POA: Diagnosis not present

## 2020-06-29 DIAGNOSIS — Z79899 Other long term (current) drug therapy: Secondary | ICD-10-CM | POA: Insufficient documentation

## 2020-06-29 DIAGNOSIS — M4712 Other spondylosis with myelopathy, cervical region: Secondary | ICD-10-CM

## 2020-06-29 DIAGNOSIS — I252 Old myocardial infarction: Secondary | ICD-10-CM | POA: Insufficient documentation

## 2020-06-29 DIAGNOSIS — M899 Disorder of bone, unspecified: Secondary | ICD-10-CM | POA: Insufficient documentation

## 2020-06-29 DIAGNOSIS — I700 Atherosclerosis of aorta: Secondary | ICD-10-CM | POA: Diagnosis not present

## 2020-06-29 DIAGNOSIS — C419 Malignant neoplasm of bone and articular cartilage, unspecified: Secondary | ICD-10-CM

## 2020-06-29 DIAGNOSIS — M4722 Other spondylosis with radiculopathy, cervical region: Secondary | ICD-10-CM

## 2020-06-29 HISTORY — DX: Other specified counseling: Z71.89

## 2020-06-29 HISTORY — DX: Malignant neoplasm of bone and articular cartilage, unspecified: C41.9

## 2020-06-29 LAB — CMP (CANCER CENTER ONLY)
ALT: 30 U/L (ref 0–44)
AST: 17 U/L (ref 15–41)
Albumin: 4 g/dL (ref 3.5–5.0)
Alkaline Phosphatase: 142 U/L — ABNORMAL HIGH (ref 38–126)
Anion gap: 7 (ref 5–15)
BUN: 22 mg/dL (ref 8–23)
CO2: 31 mmol/L (ref 22–32)
Calcium: 12.8 mg/dL — ABNORMAL HIGH (ref 8.9–10.3)
Chloride: 99 mmol/L (ref 98–111)
Creatinine: 1.61 mg/dL — ABNORMAL HIGH (ref 0.61–1.24)
GFR, Est AFR Am: 50 mL/min — ABNORMAL LOW (ref >60)
GFR, Estimated: 43 mL/min — ABNORMAL LOW (ref >60)
Glucose, Bld: 124 mg/dL — ABNORMAL HIGH (ref 70–99)
Potassium: 4.5 mmol/L (ref 3.5–5.1)
Sodium: 137 mmol/L (ref 135–145)
Total Bilirubin: 0.7 mg/dL (ref 0.3–1.2)
Total Protein: 6.4 g/dL — ABNORMAL LOW (ref 6.5–8.1)

## 2020-06-29 LAB — CBC WITH DIFFERENTIAL (CANCER CENTER ONLY)
Abs Immature Granulocytes: 0.08 10*3/uL — ABNORMAL HIGH (ref 0.00–0.07)
Basophils Absolute: 0 10*3/uL (ref 0.0–0.1)
Basophils Relative: 0 %
Eosinophils Absolute: 0.2 10*3/uL (ref 0.0–0.5)
Eosinophils Relative: 2 %
HCT: 41 % (ref 39.0–52.0)
Hemoglobin: 13.8 g/dL (ref 13.0–17.0)
Immature Granulocytes: 1 %
Lymphocytes Relative: 23 %
Lymphs Abs: 3.1 10*3/uL (ref 0.7–4.0)
MCH: 29.1 pg (ref 26.0–34.0)
MCHC: 33.7 g/dL (ref 30.0–36.0)
MCV: 86.3 fL (ref 80.0–100.0)
Monocytes Absolute: 1.4 10*3/uL — ABNORMAL HIGH (ref 0.1–1.0)
Monocytes Relative: 10 %
Neutro Abs: 8.7 10*3/uL — ABNORMAL HIGH (ref 1.7–7.7)
Neutrophils Relative %: 64 %
Platelet Count: 270 10*3/uL (ref 150–400)
RBC: 4.75 MIL/uL (ref 4.22–5.81)
RDW: 12.8 % (ref 11.5–15.5)
WBC Count: 13.6 10*3/uL — ABNORMAL HIGH (ref 4.0–10.5)
nRBC: 0 % (ref 0.0–0.2)

## 2020-06-29 LAB — SAVE SMEAR(SSMR), FOR PROVIDER SLIDE REVIEW

## 2020-06-29 NOTE — Progress Notes (Signed)
Referral MD  Reason for Referral: Multiple bony lesions with soft tissue mass associated with left scapula and left fourth rib  Chief Complaint  Patient presents with  . New Patient (Initial Visit)  : I was told that I fractured my shoulder blade on my left side.  HPI: Bruce Smith is a very nice 69 year old white male.  He comes in with his wife.  He has been in good health.  He had a myocardial infarction back in 2000.  He had 3 stents placed.  He has been doing well since then.  Of note, he recently, and I mean in the month of August, had surgery for his cervical spine.  He will underwent a C5-6 ACDF.  This was actually done on 06/17/2020.  He had no problems with surgery.  He is healing up.  He had surgery because he was having a lot of pain in the upper back and shoulders.  He fell a couple days ago.  I am unsure if he lost his balance or if he slipped.  He began to have a lot of pain in the left shoulder, particular shoulder blade.  He had no fever.  He had no bleeding.  He had no cough or shortness of breath.  He had no nausea or vomiting.  Of note, he has not had a colonoscopy.  He went to the emergency room.  He had an CT scan done.  Shockingly, this showed a lytic expansile lesion within the left scapula.  There is a fracture through the left scapula.  He had a expansile lytic lesion within the left lateral fourth rib.  He had a soft tissue mass measuring 6.4 x 4 cm.  Had a lucent lesion within T1.  Multiple small lytic lesions throughout the remainder of the bones including the right scapula, bilateral ribs and T11.  There is nothing in the mediastinum with adenopathy.  His lungs looked okay.  There is nothing in the liver.  He had no adenopathy in the abdomen or pelvis.  He had lab work done while he was in the ER.  His LDH was 171.  His white cell count was 13.4.  Hemoglobin 14.9.  Platelet count 289,000.  His sodium was 137.  Potassium 4.4.  BUN 19 creatinine 1.19.  Calcium was 11.1  with an albumin of 3.7.  His liver function studies were okay.  His total protein was 6.8.  He had tumor markers done.  His CEA was normal at 0.9.  PSA was 3.7.  He was then kindly referred to the [facility] for an evaluation.  Again he looks like he is in great shape.  He works for the city of [employer].  He has no obvious occupational exposures.  There is no history of cancer in the family.  There is a history of heart disease.  He has had nothing removed from his skin.  He has had no swollen lymph nodes.  He has had no weight loss or weight gain.  Of note, he has smoked.  He stopped about 21 years ago.  I would say probably has a 30-pack-year history of tobacco use.  Currently, I would say his performance status is ECOG 1.    Past Medical History:  Diagnosis Date  . Atherosclerosis   . Hyperlipidemia   . Hypertension   . Myocardial infarction [facility])   :  Past Surgical History:  Procedure Laterality Date  . ANTERIOR CERVICAL DECOMP/DISCECTOMY FUSION N/A 06/17/2020  Procedure: ANTERIOR CERVICAL DECOMPRESSION/DISCECTOMY FUSION, INTERBODY PROSTHESIS, PLATE/SCREWS CERVICAL FIVE- CERVICAL SIX;  Surgeon: Newman Pies, MD;  Location: [place];  Service: Neurosurgery;  Laterality: N/A;  ANTERIOR CERVICAL DECOMPRESSION/DISCECTOMY FUSION, INTERBODY PROSTHESIS, PLATE/SCREWS CERVICAL FIVE- CERVICAL SIX  . CARDIAC CATHETERIZATION    :   Current Outpatient Medications:  .  Polyethylene Glycol 3350 (MIRALAX PO), Take 2 Scoops by mouth daily., Disp: , Rfl:  .  aspirin EC 81 MG tablet, Take 81 mg by mouth daily. Swallow whole., Disp: , Rfl:  .  atorvastatin (LIPITOR) 20 MG tablet, Take 20 mg by mouth daily., Disp: , Rfl:  .  Coenzyme Q10 (CO Q-10) 200 MG CAPS, Take 200 mg by mouth daily., Disp: , Rfl:  .  cyclobenzaprine (FLEXERIL) 10 MG tablet, Take 1 tablet (10 mg total) by mouth 3 (three) times daily as needed for muscle spasms., Disp: 30 tablet, Rfl: 0 .  lisinopril  (ZESTRIL) 5 MG tablet, Take 7.5 mg by mouth daily., Disp: , Rfl:  .  metoprolol succinate (TOPROL-XL) 50 MG 24 hr tablet, Take 50 mg by mouth daily., Disp: , Rfl:  .  nitroGLYCERIN (NITROSTAT) 0.4 MG SL tablet, Place 0.4 mg under the tongue every 5 (five) minutes as needed for chest pain., Disp: , Rfl:  .  oxyCODONE 10 MG TABS, Take 1 tablet (10 mg total) by mouth every 6 (six) hours as needed for up to 5 days for severe pain., Disp: 20 tablet, Rfl: 0 .  oxyCODONE-acetaminophen (PERCOCET) 10-325 MG tablet, Take 1 tablet by mouth every 4 (four) hours as needed for pain., Disp: 30 tablet, Rfl: 0 .  prasugrel (EFFIENT) 10 MG TABS tablet, Take 10 mg by mouth daily., Disp: , Rfl:  .  pregabalin (LYRICA) 150 MG capsule, Take 150 mg by mouth in the morning and at bedtime., Disp: , Rfl: :  :  No Known Allergies:  History reviewed. No pertinent family history.:  Social History   Socioeconomic History  . Marital status: Married    Spouse name: Not on file  . Number of children: Not on file  . Years of education: Not on file  . Highest education level: Not on file  Occupational History  . Not on file  Tobacco Use  . Smoking status: Former Smoker    Quit date: 2000    Years since quitting: 21.6  . Smokeless tobacco: Current User  Vaping Use  . Vaping Use: Never used  Substance and Sexual Activity  . Alcohol use: Not on file    Comment: rare  . Drug use: Never  . Sexual activity: Not on file  Other Topics Concern  . Not on file  Social History Narrative  . Not on file   Social Determinants of Health   Financial Resource Strain:   . Difficulty of Paying Living Expenses: Not on file  Food Insecurity:   . Worried About Charity fundraiser in the Last Year: Not on file  . Ran Out of Food in the Last Year: Not on file  Transportation Needs:   . Lack of Transportation (Medical): Not on file  . Lack of Transportation (Non-Medical): Not on file  Physical Activity:   . Days of Exercise  per Week: Not on file  . Minutes of Exercise per Session: Not on file  Stress:   . Feeling of Stress : Not on file  Social Connections:   . Frequency of Communication with Friends and Family: Not on file  . Frequency of Social  Gatherings with Friends and Family: Not on file  . Attends Religious Services: Not on file  . Active Member of Clubs or Organizations: Not on file  . Attends Archivist Meetings: Not on file  . Marital Status: Not on file  Intimate Partner Violence:   . Fear of Current or Ex-Partner: Not on file  . Emotionally Abused: Not on file  . Physically Abused: Not on file  . Sexually Abused: Not on file  :  Review of Systems  Constitutional: Negative.   HENT: Negative.  Negative for hearing loss.   Eyes: Negative.   Respiratory: Negative.   Cardiovascular: Negative.   Gastrointestinal: Negative.   Genitourinary: Negative.   Musculoskeletal: Positive for falls, joint pain and myalgias.  Skin: Negative.   Neurological: Negative.   Endo/Heme/Allergies: Negative.   Psychiatric/Behavioral: Negative.    Exam:  Bruce Smith is a well-developed and well-nourished white male in no obvious distress.  Vital signs show temperature of 98.9.  Pulse 58.  Blood pressure 130/56.  Weight is 226 pounds.  Head and neck exam shows no scleral icterus.  He has no adenopathy in the neck.  He has no oral lesions.  His lungs are clear bilaterally.  Cardiac exam regular rate and rhythm with a normal S1 and S2.  There are no murmurs, rubs or bruits.  Abdomen is soft.  He is mildly obese.  He has good bowel sounds.  There is no fluid wave.  There is no palpable liver or spleen tip.  Back exam shows some tenderness and fullness in the left upper back.  He is wearing a sling immobilizing the left arm.  There is no tenderness over the spine or hips bilaterally.  Extremities shows his left arm to be immobilized by the sling.  He has good range of motion of his other joints.  He has no joint  swelling.  He has good strength in upper and lower extremities.  Neurological exam shows no focal neurological deficits.  Skin exam shows no rashes, ecchymoses or petechia.    @IPVITALS @   Recent Labs    06/29/20 1453  WBC 13.6*  HGB 13.8  HCT 41.0  PLT 270   Recent Labs    06/29/20 1453  NA 137  K 4.5  CL 99  CO2 31  GLUCOSE 124*  BUN 22  CREATININE 1.61*  CALCIUM 12.8*    Blood smear review: None  Pathology: None    Assessment and Plan: [name] is a 69 year old white male.  He has lytic bone lesions.  Has a soft tissue mass associated with the left scapula and left fourth rib.  I would think that this would be most likely lung cancer despite the fact that there is nothing seen in the lungs on his CT scan.  Myeloma would be another possibility.  His total protein is not high.  His albumin is not low.  Primary bone lymphoma is also a possibility.  Melanoma would also be a consideration given that he has had fair skin.  He has not had any moles removed.  I would not think that this is a primary bone malignancy.  I would also not think this would be a GI primary given that the CEA is okay.  A nonsecretory prostate cancer is always possible.  We will go to have to get a biopsy.  We will have to get core biopsies of this soft tissue mass associated with the left scapula and left fourth rib.  He sees orthopedic surgery tomorrow.  I do not see that this is a process that is going to be surgically correctable.  Once we confirm malignancy, we will have to get radiation oncology involved.  They live in [place] and would like to go to [facility] for radiation therapy.  We also will need to get a PET scan on him.  I spent over an hour with he and his wife.  They are both very very nice.  The daughter was on the cell phone in her car.  I went over all my recommendations.  I answered all of their questions.  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  I really cannot give them a prognosis since we do not know the origin of this malignancy.  Once we have all the studies back, we will get him back in.  I gave him a prayer blanket and he was very thankful for this.

## 2020-06-30 ENCOUNTER — Other Ambulatory Visit: Payer: Self-pay | Admitting: Hematology & Oncology

## 2020-06-30 ENCOUNTER — Encounter: Payer: Self-pay | Admitting: *Deleted

## 2020-06-30 ENCOUNTER — Encounter (HOSPITAL_COMMUNITY): Payer: Self-pay | Admitting: Radiology

## 2020-06-30 ENCOUNTER — Inpatient Hospital Stay: Payer: BC Managed Care – PPO

## 2020-06-30 ENCOUNTER — Ambulatory Visit: Payer: BC Managed Care – PPO | Admitting: Hematology & Oncology

## 2020-06-30 ENCOUNTER — Ambulatory Visit: Payer: BC Managed Care – PPO

## 2020-06-30 ENCOUNTER — Encounter: Payer: Self-pay | Admitting: Hematology & Oncology

## 2020-06-30 ENCOUNTER — Other Ambulatory Visit: Payer: BC Managed Care – PPO

## 2020-06-30 DIAGNOSIS — M899 Disorder of bone, unspecified: Secondary | ICD-10-CM | POA: Diagnosis not present

## 2020-06-30 DIAGNOSIS — C419 Malignant neoplasm of bone and articular cartilage, unspecified: Secondary | ICD-10-CM

## 2020-06-30 HISTORY — DX: Hypercalcemia: E83.52

## 2020-06-30 LAB — IGG, IGA, IGM
IgA: 154 mg/dL (ref 61–437)
IgG (Immunoglobin G), Serum: 494 mg/dL — ABNORMAL LOW (ref 603–1613)
IgM (Immunoglobulin M), Srm: 11 mg/dL — ABNORMAL LOW (ref 20–172)

## 2020-06-30 LAB — KAPPA/LAMBDA LIGHT CHAINS
Kappa free light chain: 154.7 mg/L — ABNORMAL HIGH (ref 3.3–19.4)
Kappa, lambda light chain ratio: 22.42 — ABNORMAL HIGH (ref 0.26–1.65)
Lambda free light chains: 6.9 mg/L (ref 5.7–26.3)

## 2020-06-30 LAB — LACTATE DEHYDROGENASE: LDH: 150 U/L (ref 98–192)

## 2020-06-30 MED ORDER — DENOSUMAB 120 MG/1.7ML ~~LOC~~ SOLN
120.0000 mg | Freq: Once | SUBCUTANEOUS | Status: AC
  Administered 2020-06-30: 120 mg via SUBCUTANEOUS

## 2020-06-30 MED ORDER — DENOSUMAB 120 MG/1.7ML ~~LOC~~ SOLN
SUBCUTANEOUS | Status: AC
  Filled 2020-06-30: qty 1.7

## 2020-06-30 NOTE — Progress Notes (Signed)
PA pending, okay to give today per Otilio Carpen, Financial Advocate.

## 2020-06-30 NOTE — Patient Instructions (Signed)
Denosumab injection What is this medicine? DENOSUMAB (den oh sue mab) slows bone breakdown. Prolia is used to treat osteoporosis in women after menopause and in men, and in people who are taking corticosteroids for 6 months or more. Xgeva is used to treat a high calcium level due to cancer and to prevent bone fractures and other bone problems caused by multiple myeloma or cancer bone metastases. Xgeva is also used to treat giant cell tumor of the bone. This medicine may be used for other purposes; ask your health care provider or pharmacist if you have questions. COMMON BRAND NAME(S): Prolia, XGEVA What should I tell my health care provider before I take this medicine? They need to know if you have any of these conditions:  dental disease  having surgery or tooth extraction  infection  kidney disease  low levels of calcium or Vitamin D in the blood  malnutrition  on hemodialysis  skin conditions or sensitivity  thyroid or parathyroid disease  an unusual reaction to denosumab, other medicines, foods, dyes, or preservatives  pregnant or trying to get pregnant  breast-feeding How should I use this medicine? This medicine is for injection under the skin. It is given by a health care professional in a hospital or clinic setting. A special MedGuide will be given to you before each treatment. Be sure to read this information carefully each time. For Prolia, talk to your pediatrician regarding the use of this medicine in children. Special care may be needed. For Xgeva, talk to your pediatrician regarding the use of this medicine in children. While this drug may be prescribed for children as young as 13 years for selected conditions, precautions do apply. Overdosage: If you think you have taken too much of this medicine contact a poison control center or emergency room at once. NOTE: This medicine is only for you. Do not share this medicine with others. What if I miss a dose? It is  important not to miss your dose. Call your doctor or health care professional if you are unable to keep an appointment. What may interact with this medicine? Do not take this medicine with any of the following medications:  other medicines containing denosumab This medicine may also interact with the following medications:  medicines that lower your chance of fighting infection  steroid medicines like prednisone or cortisone This list may not describe all possible interactions. Give your health care provider a list of all the medicines, herbs, non-prescription drugs, or dietary supplements you use. Also tell them if you smoke, drink alcohol, or use illegal drugs. Some items may interact with your medicine. What should I watch for while using this medicine? Visit your doctor or health care professional for regular checks on your progress. Your doctor or health care professional may order blood tests and other tests to see how you are doing. Call your doctor or health care professional for advice if you get a fever, chills or sore throat, or other symptoms of a cold or flu. Do not treat yourself. This drug may decrease your body's ability to fight infection. Try to avoid being around people who are sick. You should make sure you get enough calcium and vitamin D while you are taking this medicine, unless your doctor tells you not to. Discuss the foods you eat and the vitamins you take with your health care professional. See your dentist regularly. Brush and floss your teeth as directed. Before you have any dental work done, tell your dentist you are   receiving this medicine. Do not become pregnant while taking this medicine or for 5 months after stopping it. Talk with your doctor or health care professional about your birth control options while taking this medicine. Women should inform their doctor if they wish to become pregnant or think they might be pregnant. There is a potential for serious side  effects to an unborn child. Talk to your health care professional or pharmacist for more information. What side effects may I notice from receiving this medicine? Side effects that you should report to your doctor or health care professional as soon as possible:  allergic reactions like skin rash, itching or hives, swelling of the face, lips, or tongue  bone pain  breathing problems  dizziness  jaw pain, especially after dental work  redness, blistering, peeling of the skin  signs and symptoms of infection like fever or chills; cough; sore throat; pain or trouble passing urine  signs of low calcium like fast heartbeat, muscle cramps or muscle pain; pain, tingling, numbness in the hands or feet; seizures  unusual bleeding or bruising  unusually weak or tired Side effects that usually do not require medical attention (report to your doctor or health care professional if they continue or are bothersome):  constipation  diarrhea  headache  joint pain  loss of appetite  muscle pain  runny nose  tiredness  upset stomach This list may not describe all possible side effects. Call your doctor for medical advice about side effects. You may report side effects to FDA at 1-800-FDA-1088. Where should I keep my medicine? This medicine is only given in a clinic, doctor's office, or other health care setting and will not be stored at home. NOTE: This sheet is a summary. It may not cover all possible information. If you have questions about this medicine, talk to your doctor, pharmacist, or health care provider.  2020 Elsevier/Gold Standard (2018-02-23 16:10:44)

## 2020-06-30 NOTE — Progress Notes (Signed)
Patient seen yesterday in this office. Patient was brought in after ED consult on Friday, so this navigator was unaware of new patient appointment. Visit reviewed for needs at this time.  Patient needs a PET scan and biopsy, both of which are already scheduled for next week. I will follow for results and needs as they come.   Oncology Nurse Navigator Documentation  Oncology Nurse Navigator Flowsheets 06/30/2020  Abnormal Finding Date 06/26/2020  Diagnosis Status Additional Work Up  Navigator Follow Up Date: 07/07/2020  Navigator Follow Up Reason: Radiology  Navigator Location [facility]  Navigator Encounter Type Appt/Treatment Plan Review  Patient Visit Type MedOnc  Treatment Phase Abnormal Scans

## 2020-06-30 NOTE — Addendum Note (Signed)
Addended by: Burney Gauze R on: 06/30/2020 09:55 AM   Modules accepted: Orders

## 2020-06-30 NOTE — Progress Notes (Signed)
Bruce Smith Male, 69 y.o., October 31, 1951 MRN:  [id number] Phone:  [phone] (H) PCP:  Derrill Center., MD Primary Cvg:  Medicare/Medicare Part A Next Appt With Radiology ([facility] PET) 07/07/2020 at 1:00 PM  RE: CT Biopsy / blood thinner Received: Today Ennever, Rudell Cobb, MD  Jillyn Hidden Ok to hold Prasugrel for 7 days.        Previous Messages   ----- Message -----  From: [name]  Sent: 06/30/2020  9:57 AM EDT  To: Volanda Napoleon, MD  Subject: CT Biopsy / blood thinner             Good Morning, Dr. [name], patient is on Prasugrel and this particular blood thinner will need to be held for 7 days prior to his biopsy. Need to know if it is okay, it looks like patient was put on this medication while in the hospital. I did call patient and he stated that he sees a Cardiologist now but he only gave me a first name which was Ebony Hail. Not sure who that is. Please advise if okay to hold, thanks Tasha.

## 2020-06-30 NOTE — Progress Notes (Unsigned)
Bruce Smith Male, 69 y.o., [birth year] MRN:  [id number] Phone:  [phone] (H) PCP:  Derrill Center., MD Primary Cvg:  Medicare/Medicare Part A Next Appt With Radiology ([facility]) 07/07/2020 at 1:00 PM  RE: CT Biopsy Received: Today [name], MD  [name] Approved for CT guided core biopsy of lytic mass of left scapula. Obtain enough samples for molecular studies and comment to path not to decalcify the samples.   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 Soft tissue mass associated with left scapula and left fourth rib,  Please obtain multiple soft tissue mass biopsy so we can have enough material for molecular studies. Thanks much for all your help and happy Labor Day!!!    History: CT in computer, NM PET to be scheduled   Provider: Volanda Napoleon   Provider Contact:  [phone]

## 2020-07-02 LAB — PROTEIN ELECTROPHORESIS, SERUM, WITH REFLEX
A/G Ratio: 1.1 (ref 0.7–1.7)
Albumin ELP: 3.1 g/dL (ref 2.9–4.4)
Alpha-1-Globulin: 0.4 g/dL (ref 0.0–0.4)
Alpha-2-Globulin: 1 g/dL (ref 0.4–1.0)
Beta Globulin: 1 g/dL (ref 0.7–1.3)
Gamma Globulin: 0.6 g/dL (ref 0.4–1.8)
Globulin, Total: 2.9 g/dL (ref 2.2–3.9)
SPEP Interpretation: 0
Total Protein ELP: 6 g/dL (ref 6.0–8.5)

## 2020-07-02 LAB — IMMUNOFIXATION REFLEX, SERUM
IgA: 151 mg/dL (ref 61–437)
IgG (Immunoglobin G), Serum: 527 mg/dL — ABNORMAL LOW (ref 603–1613)
IgM (Immunoglobulin M), Srm: 11 mg/dL — ABNORMAL LOW (ref 20–172)

## 2020-07-07 ENCOUNTER — Other Ambulatory Visit: Payer: Self-pay

## 2020-07-07 ENCOUNTER — Encounter: Payer: Self-pay | Admitting: *Deleted

## 2020-07-07 ENCOUNTER — Encounter (HOSPITAL_COMMUNITY)
Admission: RE | Admit: 2020-07-07 | Discharge: 2020-07-07 | Disposition: A | Discharge status: Home / Self Care | Payer: BC Managed Care – PPO | Source: Ambulatory Visit | Attending: Hematology & Oncology | Admitting: Hematology & Oncology

## 2020-07-07 ENCOUNTER — Other Ambulatory Visit: Payer: Self-pay | Admitting: Radiology

## 2020-07-07 DIAGNOSIS — C419 Malignant neoplasm of bone and articular cartilage, unspecified: Secondary | ICD-10-CM | POA: Insufficient documentation

## 2020-07-07 LAB — GLUCOSE, CAPILLARY: Glucose-Capillary: 106 mg/dL — ABNORMAL HIGH (ref 70–99)

## 2020-07-07 IMAGING — CT NM PET TUM IMG INITIAL (PI) SKULL BASE T - THIGH
7 of 8 series · 19 of 25 positions shown · non-contrast
Comparison: CT angiography of the chest

CLINICAL DATA: Initial treatment strategy for soft tissue mass in
the LEFT scapula.

EXAM:
NUCLEAR MEDICINE PET SKULL BASE TO THIGH
TECHNIQUE: 11.56 mCi F-18 FDG was injected intravenously. Full-ring PET imaging
was performed from the skull base to thigh after the radiotracer. CT
data was obtained and used for attenuation correction and anatomic
localization.
Fasting blood glucose: 106 mg/dl

[Series 3: pet sk_thigh ac · axial · 5.0mm · 4.07mm/px · z∈[-966,-58]mm · 5 of 228 slices shown]
[im 1/228]
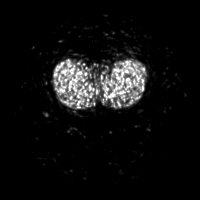
[im 46/228]
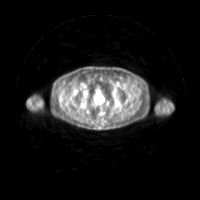
[im 137/228]
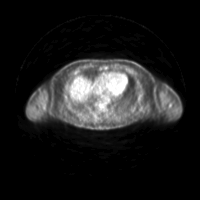
[im 182/228]
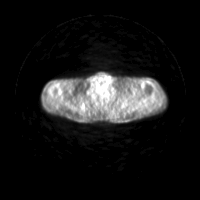
[im 228/228]
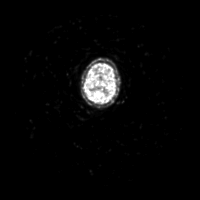

[Series 4: ct sk_thigh 5.0 b31f · axial · 5.0mm · 0.98mm/px · z∈[-742,-286]mm · 3 of 228 slices shown]
[im 57/228]
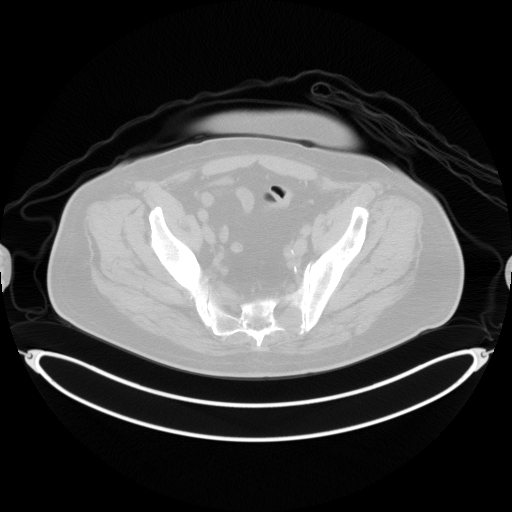
[im 114/228]
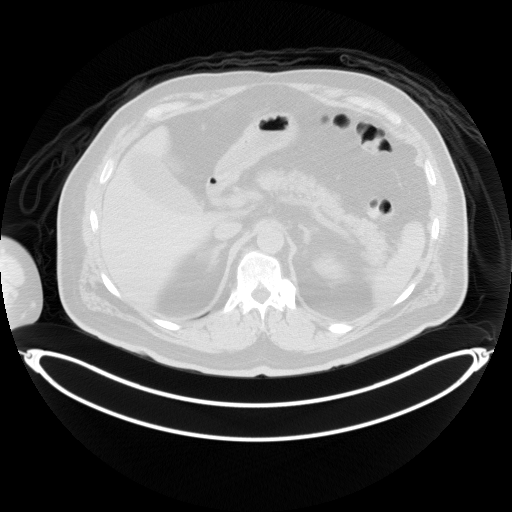
[im 171/228]
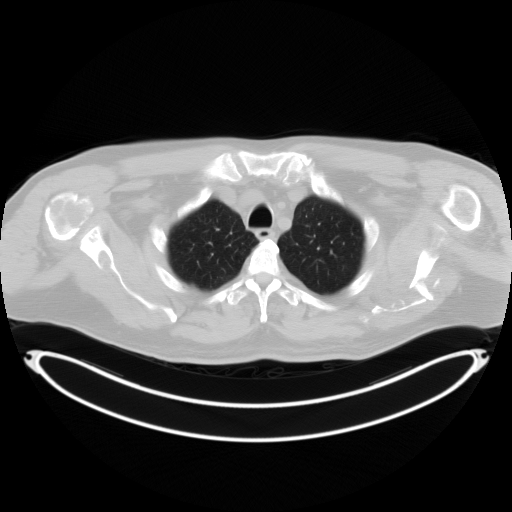

[Series 5: pet sk_thigh nac · axial · 5.0mm · 4.07mm/px · z∈[-966,-58]mm · 4 of 228 slices shown]
[im 1/228]
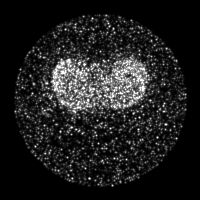
[im 57/228]
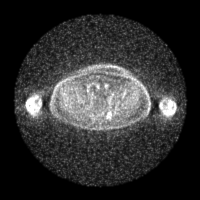
[im 114/228]
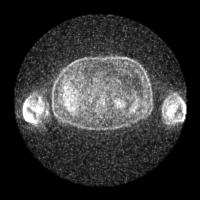
[im 228/228]
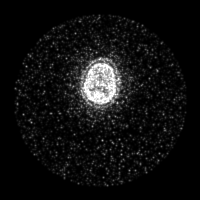

[Series 8: ct sk_thigh 5.0 b70f (id)_bone · axial · 5.0mm · 0.66mm/px · 1 of 63 slices shown]
[im 1/63  bone]
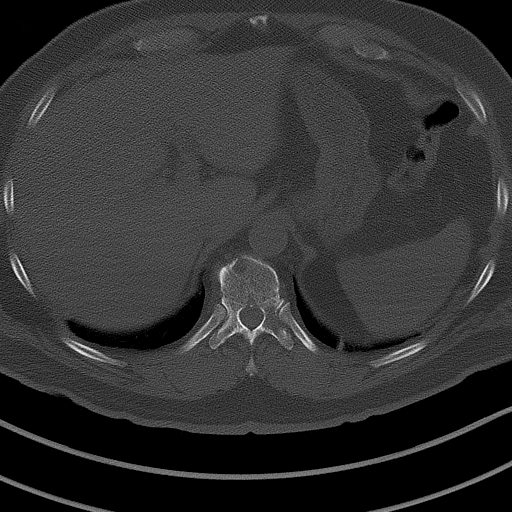

[Series 603: mip range 5 · coronal · 1.88mm/px · 1 of 32 slices shown]
[im 1/32]
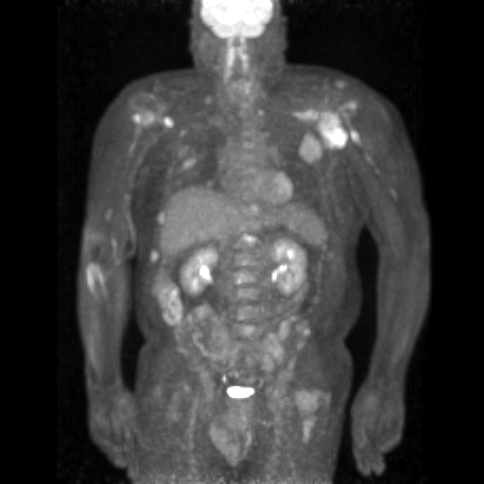

[Series 606: range-ct sk_thigh 5.0 (id)<alpha range> · 4 of 218 slices shown]
[im 1/218]
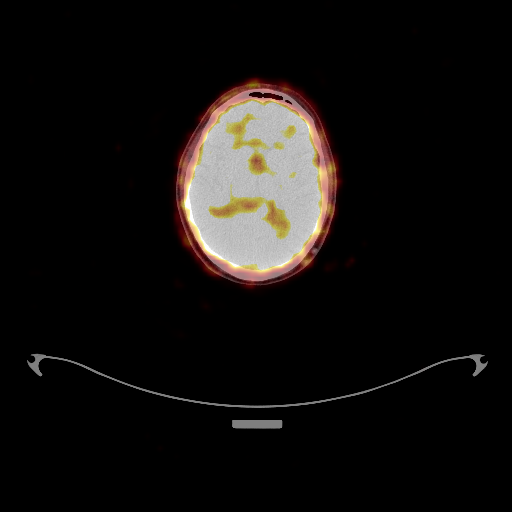
[im 55/218]
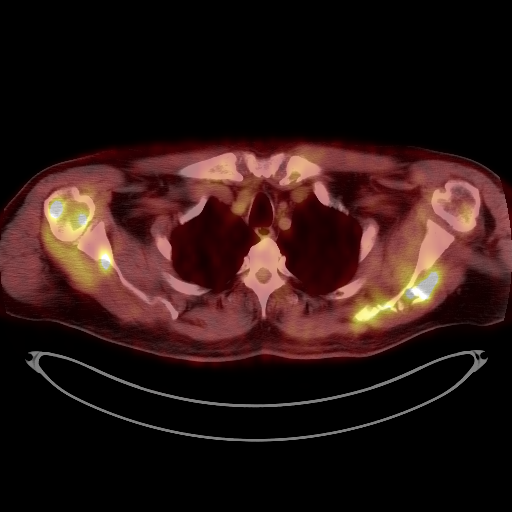
[im 109/218]
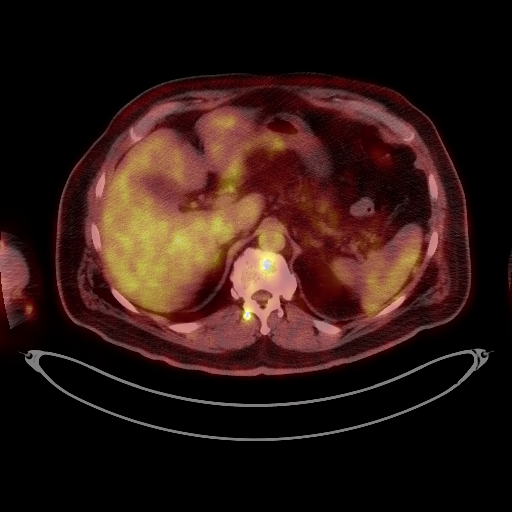
[im 218/218]
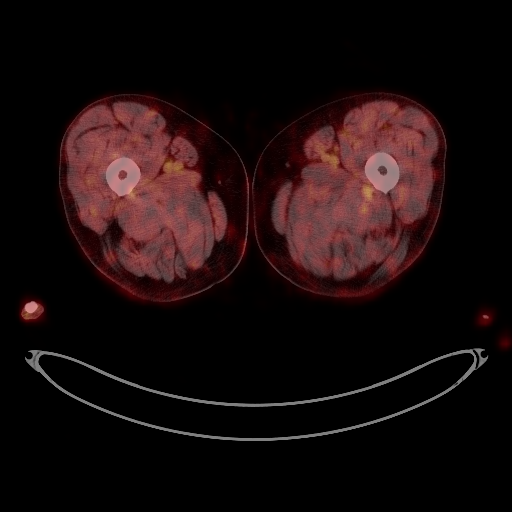

[Series 1094: results mm oncology reading · 1.0mm · 0.50mm/px · 1 of 9 slices shown]
[im 1/9]
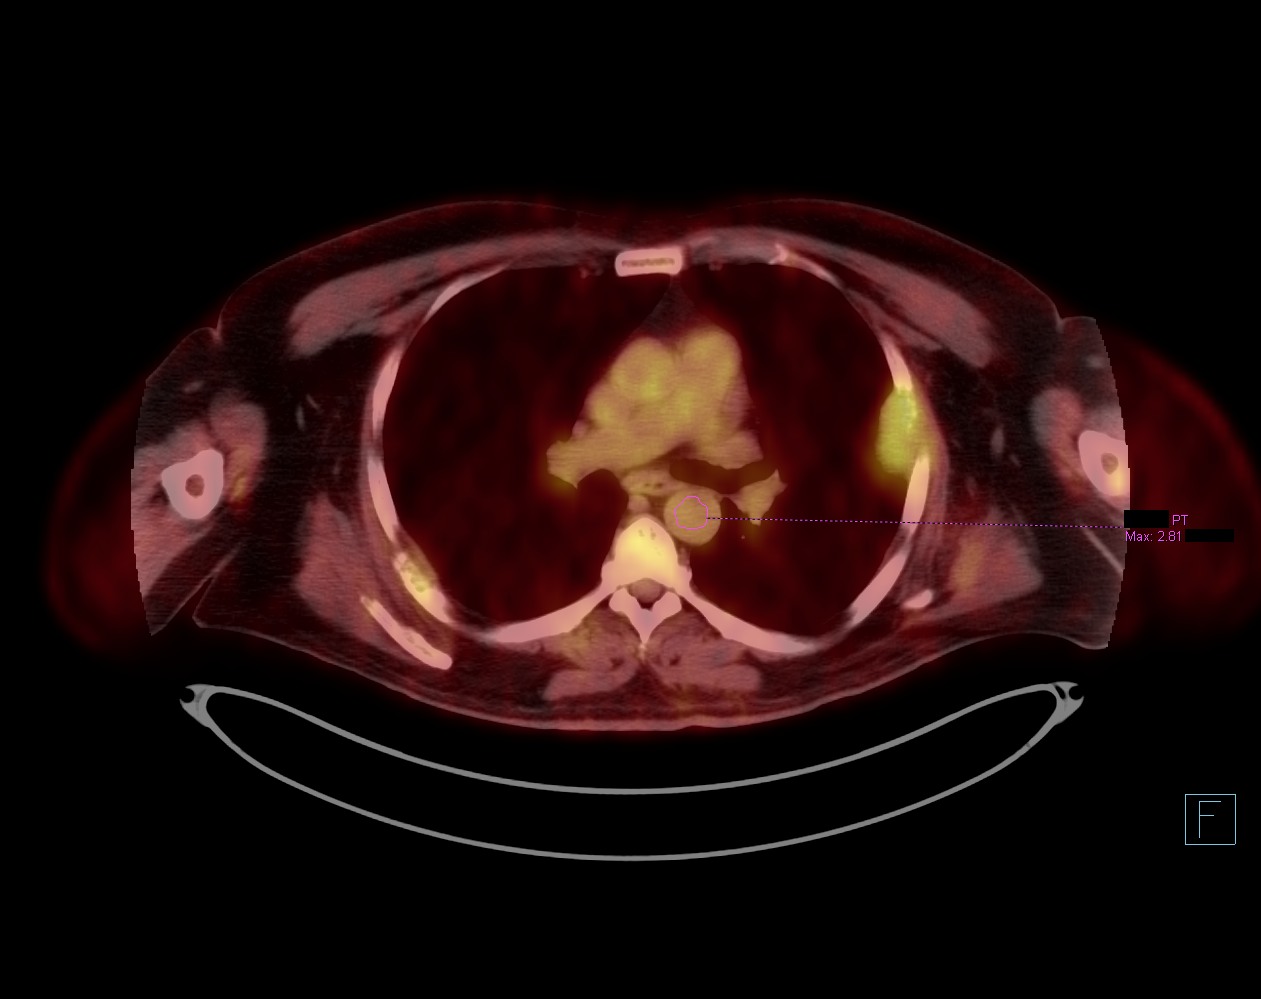

[19 of 25 positions shown; findings below may reference images not displayed]

FINDINGS: Mediastinal blood pool activity: SUV max

Liver activity: SUV max

NECK: No hypermetabolic lymph nodes in the neck. Diffuse muscular
activity about the longus coli and longus capitis musculature.

Incidental CT findings: Scalp lesions on the LEFT overlying the LEFT
occipital parietal region showing some calcification largest
approximately 10 x 6.2 mm.

CHEST: Chest wall involvement, see below under the musculoskeletal
section. No sign of adenopathy in the chest

Incidental CT findings: Calcified atheromatous plaque and calcified
coronary artery disease. Similar to previous imaging. Non-contrast
appearance of heart and great vessels with limited assessment due to
lack of intravenous contrast.

Signs of basilar atelectasis. No effusion. No consolidation. Tiny
pulmonary nodule in the RIGHT upper lobe measuring approximately 7 x
3 mm (image 19, series 8) no increased FDG uptake.

ABDOMEN/PELVIS: No abnormal hypermetabolic activity within the
liver, pancreas, adrenal glands, or spleen. No hypermetabolic lymph
nodes in the abdomen or pelvis.

Incidental CT findings: Liver with normal size and contour. No
pericholecystic stranding. No gross biliary duct dilation. No
peripancreatic inflammation. Spleen normal in size and contour.

No hydronephrosis. Small LEFT intrarenal calculus in the interpolar
LEFT kidney.

SKELETON: Multifocal areas of lytic and destructive changes.

LEFT scapular lesion with associated soft tissue and bony
destruction as well as pathologic fracture is unchanged with respect
to size displaying a maximum SUV of 15.1 (image 64, series 4)

Numerous rib lesions are present bilaterally displaying increased
FDG uptake. The largest of these is a expansile lesion in the chest
wall destroying the LEFT fourth rib and third ribs with lenticular
morphology measuring approximately 5.8 x 3.6 cm, size could be
altered based on changes and chest wall orientation. Overall similar
to the previous study. (SUVmax = 5.8)

RIGHT humeral lesions also with increased metabolic activity (image
57, series [DATE] x 2.3 cm area of increased metabolic activity and
destructive change (SUVmax = 5.8, adjacent smaller lesion with
increased metabolic activity as well in the greater tuberosity
cm with a maximum SUV of 9.1)

Subtle lytic foci in the proximal LEFT humerus as well with
punched-out cortical lesions.

RIGHT scapular lesion also seen on image 58, 1.7 cm also with
increased metabolic activity.

Innumerable other foci of lytic change throughout nearly every
visualized level of the thoracic and lumbar spine. Area of greatest
involvement in the upper thoracic spine is the T1 vertebral body
which was referenced on the previous cervical spine CT displaying
early posterior cortical disruption. This area measures
approximately 1.8 x 1.4 cm anterior plate and screw fixation of the
cervical spine is just above this level.

T10 and T12 also display marked hypermetabolism associated with
lytic changes (image 98, series 4) 12 mm area along the lateral
cortex of the vertebral body with a maximum SUV of

T12 lesion on image 113 of series 4 measuring approximately 13-14 mm
with a maximum SUV of 11.1. Diffuse activity within L2, associated
with a large lytic focus that measures approximately 2.0 x 2.1 cm.,
marked hypermetabolic focus within the sacrum and multifocal lytic
changes within the bilateral iliac bones, largest on the LEFT
measuring approximately 2.9 x 1.3 cm

All areas of the pelvis are involved to some extent with the iliac
bones showing greatest involvement.

LEFT femoral neck lesion at risk for pathologic fracture occupying
nearly the entire LEFT femoral neck measuring 3.7 x 2.5 cm (image
187, series 4) (SUVmax = 4.9) signs of spinal fusion in the cervical
spine. Relative sparing of the cervical spine

Incidental CT findings: none
IMPRESSION: Multifocal lytic bone lesions, all levels of thoracic and lumbar
spine show involvement, lytic changes in the LEFT scapula with
destruction and pathologic fracture and destruction of LEFT-sided
ribs with dominant chest wall lesion as described.

Multifocal involvement also involving the bilateral humeri and the
bilateral femora most notably in the LEFT proximal femur where there
is a large destructive lesion at risk for pathologic fracture
involving nearly the entire LEFT femoral neck.

Imaging characteristics are most suggestive of either myeloma or
metastatic disease. Of primary neoplasms that could present in this
fashion melanoma is the leading consideration. This is in the
absence of a lung lesion which would explain these findings.

Small RIGHT upper lobe pulmonary nodule measuring 7 x 4 mm is
nonspecific, attention on follow-up.

No signs of adenopathy or splenic enlargement.

These results will be called to the ordering clinician or
representative by the Radiologist Assistant, and communication
documented in the PACS or [REDACTED].

## 2020-07-07 MED ORDER — FLUDEOXYGLUCOSE F - 18 (FDG) INJECTION
11.5600 | Freq: Once | INTRAVENOUS | Status: AC | PRN
  Administered 2020-07-07: 11.56 via INTRAVENOUS

## 2020-07-07 NOTE — Progress Notes (Signed)
Oncology Nurse Navigator Documentation  Oncology Nurse Navigator Flowsheets 07/07/2020  Abnormal Finding Date -  Diagnosis Status -  Navigator Follow Up Date: 07/09/2020  Navigator Follow Up Reason: Other:  Navigator Location [facility]  Navigator Encounter Type Scan Review  Patient Visit Type MedOnc  Treatment Phase Pre-Tx/Tx Discussion  Barriers/Navigation Needs No Barriers At This Time  Interventions None Required  Acuity Level 2-Minimal Needs (1-2 Barriers Identified)  Time Spent with Patient 15

## 2020-07-09 ENCOUNTER — Encounter (HOSPITAL_COMMUNITY): Payer: Self-pay

## 2020-07-09 ENCOUNTER — Other Ambulatory Visit: Payer: Self-pay

## 2020-07-09 ENCOUNTER — Ambulatory Visit (HOSPITAL_COMMUNITY)
Admission: RE | Admit: 2020-07-09 | Discharge: 2020-07-09 | Disposition: A | Discharge status: Home / Self Care | Payer: BC Managed Care – PPO | Source: Ambulatory Visit | Attending: Hematology & Oncology | Admitting: Hematology & Oncology

## 2020-07-09 DIAGNOSIS — Z955 Presence of coronary angioplasty implant and graft: Secondary | ICD-10-CM | POA: Diagnosis not present

## 2020-07-09 DIAGNOSIS — E785 Hyperlipidemia, unspecified: Secondary | ICD-10-CM | POA: Insufficient documentation

## 2020-07-09 DIAGNOSIS — C9000 Multiple myeloma not having achieved remission: Secondary | ICD-10-CM | POA: Insufficient documentation

## 2020-07-09 DIAGNOSIS — Z981 Arthrodesis status: Secondary | ICD-10-CM | POA: Insufficient documentation

## 2020-07-09 DIAGNOSIS — C419 Malignant neoplasm of bone and articular cartilage, unspecified: Secondary | ICD-10-CM

## 2020-07-09 DIAGNOSIS — I10 Essential (primary) hypertension: Secondary | ICD-10-CM | POA: Diagnosis not present

## 2020-07-09 DIAGNOSIS — Z87891 Personal history of nicotine dependence: Secondary | ICD-10-CM | POA: Insufficient documentation

## 2020-07-09 DIAGNOSIS — Z79899 Other long term (current) drug therapy: Secondary | ICD-10-CM | POA: Insufficient documentation

## 2020-07-09 DIAGNOSIS — Z7982 Long term (current) use of aspirin: Secondary | ICD-10-CM | POA: Diagnosis not present

## 2020-07-09 DIAGNOSIS — I252 Old myocardial infarction: Secondary | ICD-10-CM | POA: Diagnosis not present

## 2020-07-09 LAB — CBC WITH DIFFERENTIAL/PLATELET
Abs Immature Granulocytes: 0.06 10*3/uL (ref 0.00–0.07)
Basophils Absolute: 0 10*3/uL (ref 0.0–0.1)
Basophils Relative: 0 %
Eosinophils Absolute: 0.3 10*3/uL (ref 0.0–0.5)
Eosinophils Relative: 3 %
HCT: 39.3 % (ref 39.0–52.0)
Hemoglobin: 13.3 g/dL (ref 13.0–17.0)
Immature Granulocytes: 1 %
Lymphocytes Relative: 38 %
Lymphs Abs: 3.5 10*3/uL (ref 0.7–4.0)
MCH: 29.6 pg (ref 26.0–34.0)
MCHC: 33.8 g/dL (ref 30.0–36.0)
MCV: 87.3 fL (ref 80.0–100.0)
Monocytes Absolute: 1 10*3/uL (ref 0.1–1.0)
Monocytes Relative: 11 %
Neutro Abs: 4.3 10*3/uL (ref 1.7–7.7)
Neutrophils Relative %: 47 %
Platelets: 319 10*3/uL (ref 150–400)
RBC: 4.5 MIL/uL (ref 4.22–5.81)
RDW: 13.4 % (ref 11.5–15.5)
WBC: 9.1 10*3/uL (ref 4.0–10.5)
nRBC: 0 % (ref 0.0–0.2)

## 2020-07-09 LAB — BASIC METABOLIC PANEL
Anion gap: 6 (ref 5–15)
BUN: 14 mg/dL (ref 8–23)
CO2: 23 mmol/L (ref 22–32)
Calcium: 8.1 mg/dL — ABNORMAL LOW (ref 8.9–10.3)
Chloride: 108 mmol/L (ref 98–111)
Creatinine, Ser: 0.96 mg/dL (ref 0.61–1.24)
GFR calc Af Amer: >60 mL/min (ref >60)
GFR calc non Af Amer: >60 mL/min (ref >60)
Glucose, Bld: 116 mg/dL — ABNORMAL HIGH (ref 70–99)
Potassium: 3.6 mmol/L (ref 3.5–5.1)
Sodium: 137 mmol/L (ref 135–145)

## 2020-07-09 LAB — PROTIME-INR
INR: 1.1 (ref 0.8–1.2)
Prothrombin Time: 13.3 s (ref 11.4–15.2)

## 2020-07-09 IMAGING — CT CT BIOPSY
1 of 5 series · 13 of 32 positions shown, 18 images · non-contrast
Comparison: none

INDICATION: 69-year-old with multiple lytic bone lesions. Tissue diagnosis is
needed. Patient has a left scapular fracture with an expansile lytic
lesion.

[Series 4: i-spiral 5.0 bf37 · axial · 0.98mm/px · z∈[+1349,+1510]mm · 13 of 52 slices shown, 18 images]
[im 3/52  soft-tissue]
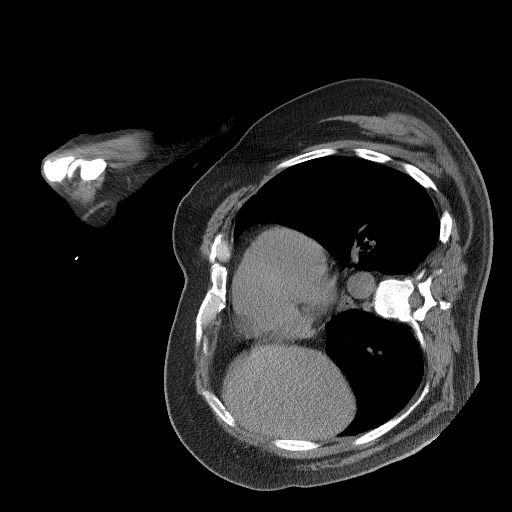
[im 3/52  bone]
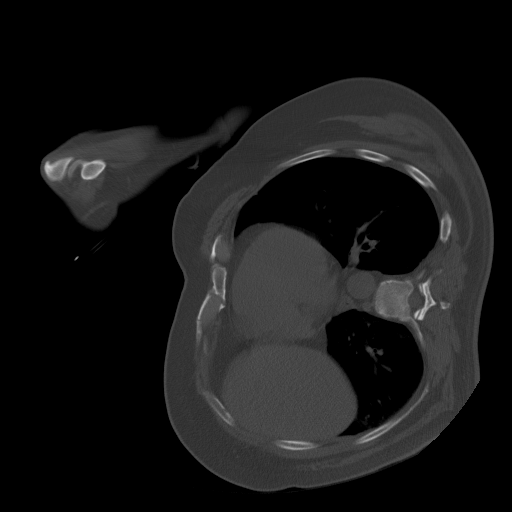
[im 9/52  soft-tissue]
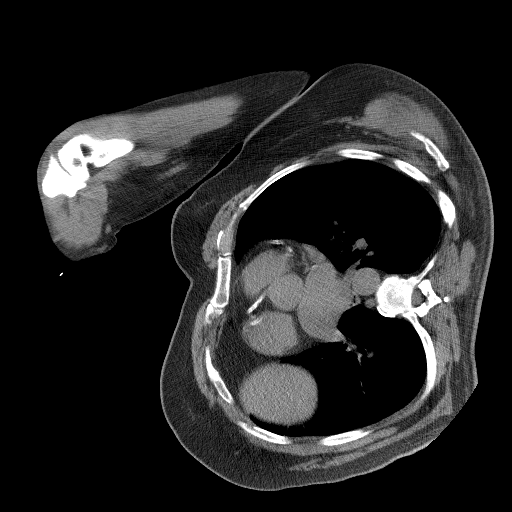
[im 12/52  soft-tissue]
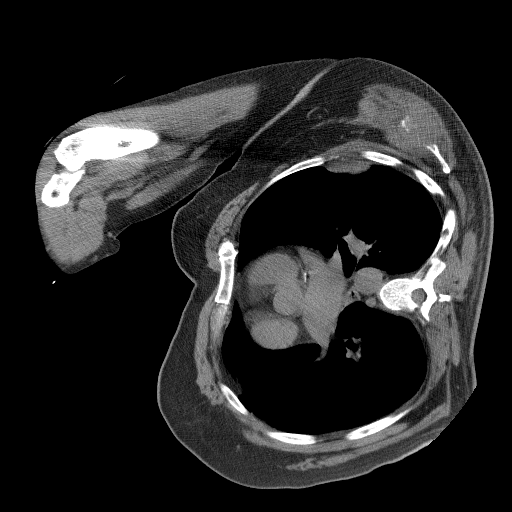
[im 15/52  soft-tissue]
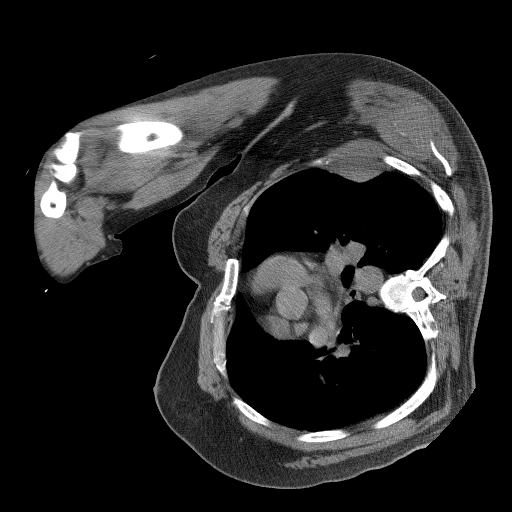
[im 20/52  soft-tissue]
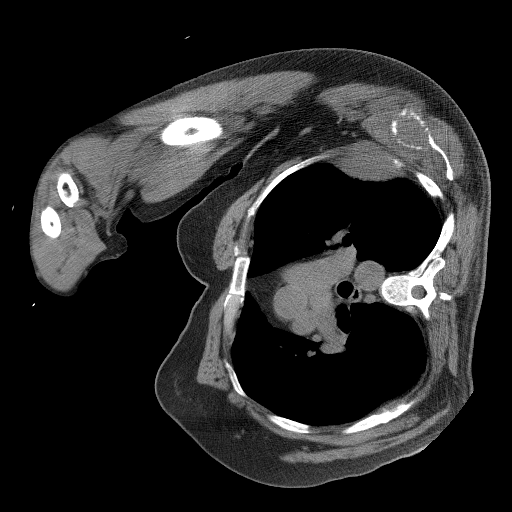
[im 23/52  soft-tissue]
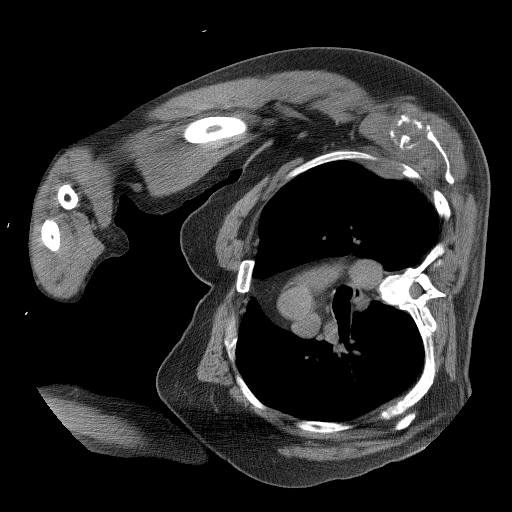
[im 29/52  soft-tissue]
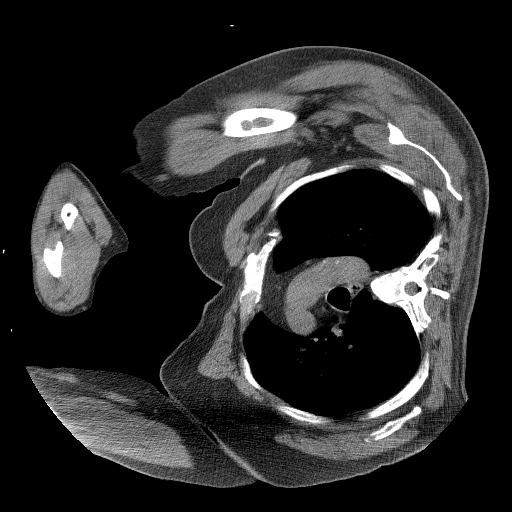
[im 32/52  soft-tissue]
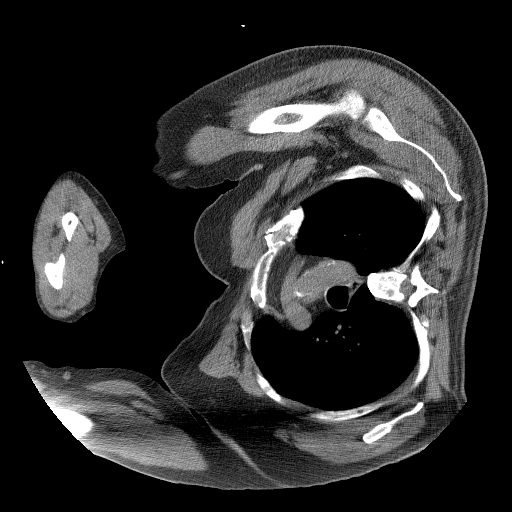
[im 37/52  soft-tissue]
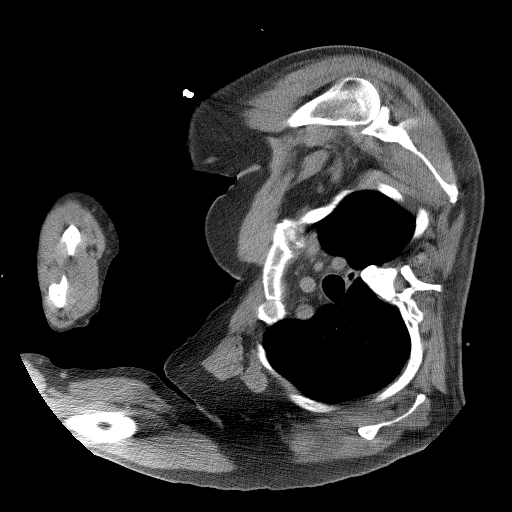
[im 37/52  bone]
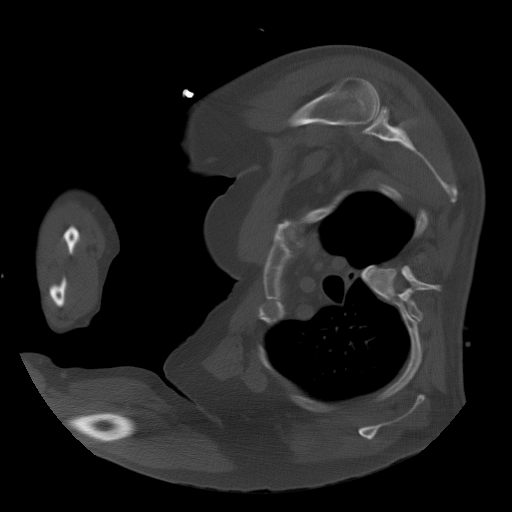
[im 40/52  soft-tissue]
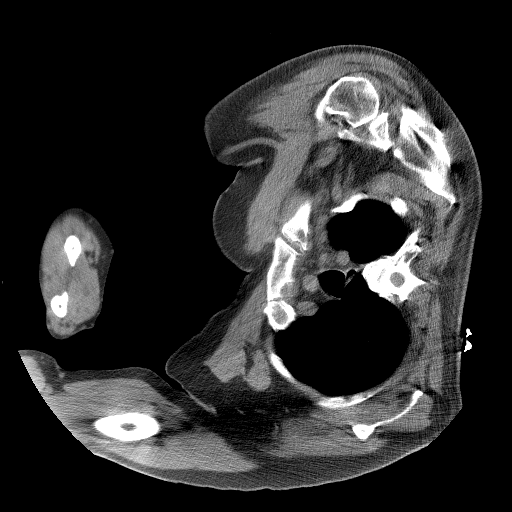
[im 40/52  lung]
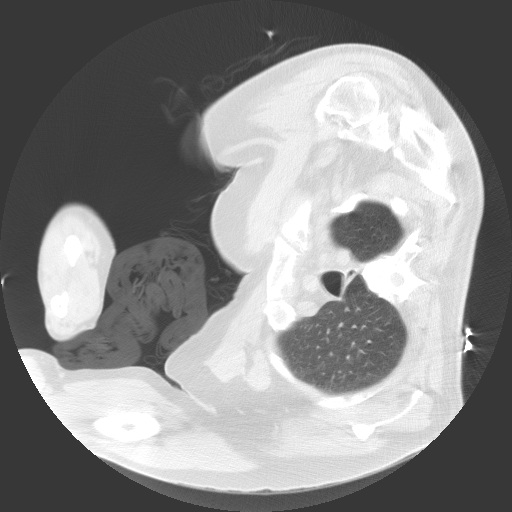
[im 43/52  soft-tissue]
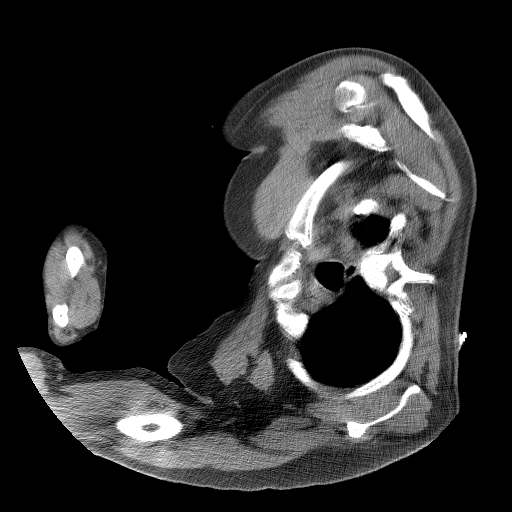
[im 43/52  lung]
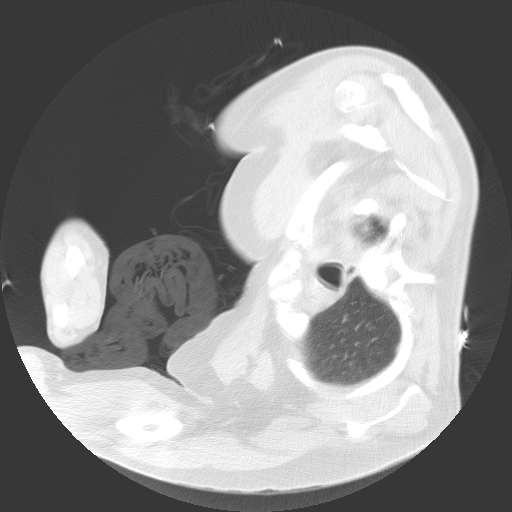
[im 46/52  lung]
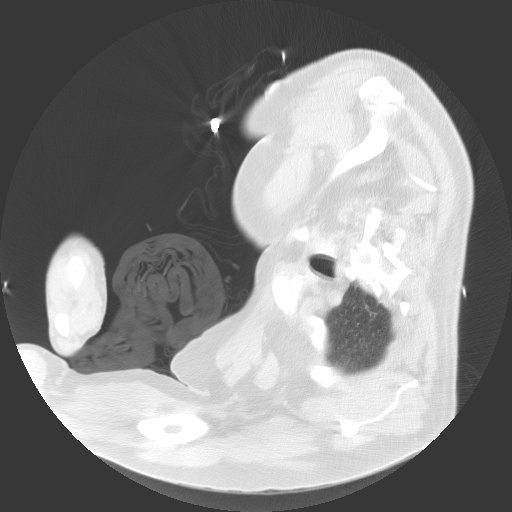
[im 49/52  soft-tissue]
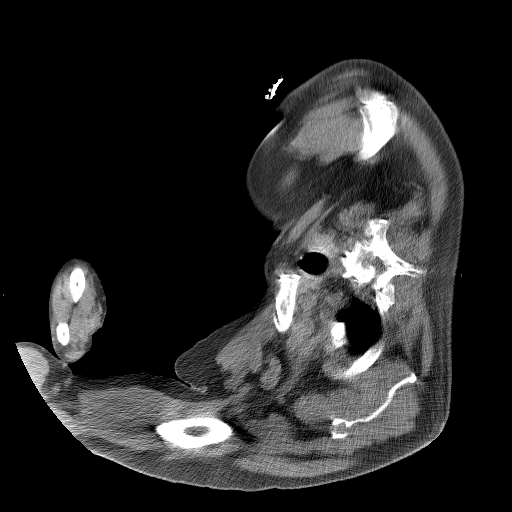
[im 49/52  lung]
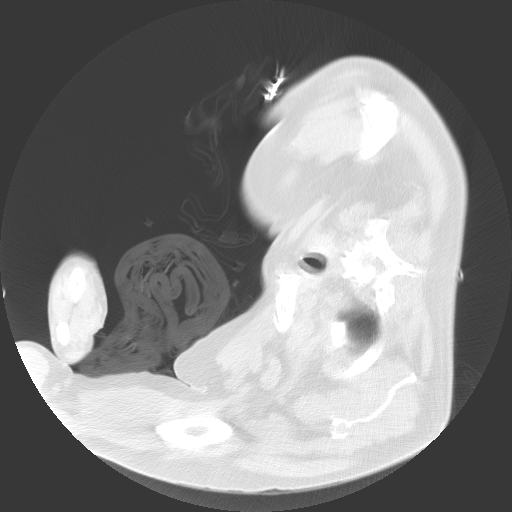

[13 of 32 positions shown; findings below may reference images not displayed]

EXAM:
CT-GUIDED BIOPSY OF LEFT SCAPULA BONE LESION

MEDICATIONS:
None.

ANESTHESIA/SEDATION:
Moderate (conscious) sedation was employed during this procedure. A
total of Versed 2.0 mg and Fentanyl 100 mcg was administered
intravenously.

Moderate Sedation Time: 14 minutes minutes. The patient's level of
consciousness and vital signs were monitored continuously by
radiology nursing throughout the procedure under my direct
supervision.

FLUOROSCOPY TIME:  None

COMPLICATIONS:
None immediate.

PROCEDURE:
Informed written consent was obtained from the patient after a
thorough discussion of the procedural risks, benefits and
alternatives. All questions were addressed. A timeout was performed
prior to the initiation of the procedure.

Patient was placed in a right lateral decubitus position. CT images
through the chest were obtained. The expansile lytic lesion in left
scapula was identified and targeted. The overlying skin was prepped
with chlorhexidine and sterile field was created. Skin was
anesthetized using 1% lidocaine. Small incision was made. Using CT
guidance, 17 gauge coaxial needle was directed into the lucent bone
lesion. Multiple soft tissue core biopsies were obtained with an 18
gauge core device. Adequate specimens were obtained and placed in
formalin. The 17 gauge needle was removed without complication.
Bandage placed over the puncture site.
FINDINGS: Fracture of the left scapula with an expansile lytic lesion. Biopsy
needle was confirmed within the expansile lytic lesion. Multiple
soft tissue core biopsies were obtained from the lesion. Again noted
is a large expansile lesion involving the left lateral chest.
Additional lucent bone lesions.
IMPRESSION: Successful CT-guided core biopsy of the expansile lytic lesion
involving the left scapula.

## 2020-07-09 MED ORDER — MIDAZOLAM HCL 2 MG/2ML IJ SOLN
INTRAMUSCULAR | Status: AC
  Filled 2020-07-09: qty 2

## 2020-07-09 MED ORDER — MIDAZOLAM HCL 2 MG/2ML IJ SOLN
INTRAMUSCULAR | Status: AC | PRN
  Administered 2020-07-09 (×2): 1 mg via INTRAVENOUS

## 2020-07-09 MED ORDER — NALOXONE HCL 0.4 MG/ML IJ SOLN
INTRAMUSCULAR | Status: AC
  Filled 2020-07-09: qty 1

## 2020-07-09 MED ORDER — FENTANYL CITRATE (PF) 100 MCG/2ML IJ SOLN
INTRAMUSCULAR | Status: AC
  Filled 2020-07-09: qty 2

## 2020-07-09 MED ORDER — LIDOCAINE HCL (PF) 1 % IJ SOLN
INTRAMUSCULAR | Status: AC | PRN
  Administered 2020-07-09: 10 mL via INTRADERMAL

## 2020-07-09 MED ORDER — FENTANYL CITRATE (PF) 100 MCG/2ML IJ SOLN
INTRAMUSCULAR | Status: AC | PRN
  Administered 2020-07-09 (×2): 50 ug via INTRAVENOUS

## 2020-07-09 MED ORDER — FLUMAZENIL 0.5 MG/5ML IV SOLN
INTRAVENOUS | Status: AC
  Filled 2020-07-09: qty 5

## 2020-07-09 MED ORDER — SODIUM CHLORIDE 0.9 % IV SOLN: INTRAVENOUS | Status: DC

## 2020-07-09 NOTE — Procedures (Signed)
Interventional Radiology Procedure:   Indications: Lytic bone lesions  Procedure: CT guided biopsy of left scapula lesion  Findings: Lytic bone lesion.  Soft tissue core biopsies obtained.   Complications: None     EBL: less than 10 ml  Plan: Discharge to home.    Wilfredo Canterbury R. Anselm Pancoast, MD  Pager: [phone]

## 2020-07-09 NOTE — Consult Note (Addendum)
Chief Complaint: Patient was seen in consultation today for CT-guided biopsy of lytic mass left scapula  Referring Physician(s): Ennever,Peter R  Supervising Physician: Markus Daft  Patient Status: [facility] - Out-pt  History of Present Illness: Bruce Smith is a 69 y.o. 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 He underwent C5/6 ACDF on 06/17/2020. He recently fell at home and began to experience pain in the left shoulder/shoulder blade. Subsequent imaging has revealed: Multifocal lytic bone lesions, all levels of thoracic and lumbar spine show involvement, lytic changes in the LEFT scapula with destruction and pathologic fracture and destruction of LEFT-sided ribs with dominant chest wall lesion as described.  Multifocal involvement also involving the bilateral humeri and the bilateral femora most notably in the LEFT proximal femur where there is a large destructive lesion at risk for pathologic fracture involving nearly the entire LEFT femoral neck.  Imaging characteristics are most suggestive of either myeloma or metastatic disease. Of primary neoplasms that could present in this fashion melanoma is the leading consideration. This is in the absence of a lung lesion which would explain these findings.  Small RIGHT upper lobe pulmonary nodule measuring 7 x 4 mm is nonspecific, attention on follow-up.  No signs of adenopathy or splenic enlargement.  Laboratory studies have revealed elevated free kappa light chains and kappa lambda ratio. No M spike on SPEP. He has no known history of cancer. He presents today for CT-guided biopsy of the lytic mass of the left scapula for further evaluation.  Past Medical History:  Diagnosis Date  . Atherosclerosis   . Goals of care, counseling/discussion 06/29/2020  . 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 Hyperlipidemia   . Hypertension   . 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 Myocardial infarction [facility])     Past Surgical History:  Procedure Laterality Date  . ANTERIOR CERVICAL DECOMP/DISCECTOMY FUSION N/A 06/17/2020   Procedure: ANTERIOR CERVICAL DECOMPRESSION/DISCECTOMY FUSION, INTERBODY PROSTHESIS, PLATE/SCREWS CERVICAL FIVE- CERVICAL SIX;  Surgeon: Newman Pies, MD;  Location: [place];  Service: Neurosurgery;  Laterality: N/A;  ANTERIOR CERVICAL DECOMPRESSION/DISCECTOMY FUSION, INTERBODY PROSTHESIS, PLATE/SCREWS CERVICAL FIVE- CERVICAL SIX  . CARDIAC CATHETERIZATION      Allergies: Patient has no known allergies.  Medications: Prior to Admission medications   Medication Sig Start Date End Date Taking? Authorizing Provider  aspirin EC 81 MG tablet Take 81 mg by mouth daily. Swallow whole.   Yes [provider]  atorvastatin (LIPITOR) 20 MG tablet Take 20 mg by mouth daily.   Yes [provider]  Coenzyme Q10 (CO Q-10) 200 MG CAPS Take 200 mg by mouth daily.   Yes [provider]  lisinopril (ZESTRIL) 5 MG tablet Take 7.5 mg by mouth daily. 03/10/20  Yes [provider]  metoprolol succinate (TOPROL-XL) 50 MG 24 hr tablet Take 50 mg by mouth daily. 03/10/20  Yes [provider]  oxyCODONE-acetaminophen (PERCOCET) 10-325 MG tablet Take 1 tablet by mouth every 4 (four) hours as needed for pain. 06/18/20  Yes Viona Gilmore D, NP  Polyethylene Glycol 3350 (MIRALAX PO) Take 2 Scoops by mouth daily.   Yes [provider]  cyclobenzaprine (FLEXERIL) 10 MG tablet Take 1 tablet (10 mg total) by mouth 3 (three) times daily as needed for muscle spasms. 06/18/20   Viona Gilmore D, NP  nitroGLYCERIN (NITROSTAT) 0.4 MG SL tablet Place 0.4 mg under the tongue every 5 (five) minutes as needed for chest pain.  [provider]  prasugrel (EFFIENT) 10 MG TABS tablet Take 10 mg by mouth daily. 05/12/20   [provider]  pregabalin (LYRICA) 150 MG capsule Take 150 mg  by mouth in the morning and at bedtime. 06/02/20   [provider]     History reviewed. No pertinent family history.  Social History   Socioeconomic History  . Marital status: Married    Spouse name: Not on file  . Number of children: Not on file  . Years of education: Not on file  . Highest education level: Not on file  Occupational History  . Not on file  Tobacco Use  . Smoking status: Former Smoker    Quit date: 2000    Years since quitting: 21.7  . Smokeless tobacco: Current User  Vaping Use  . Vaping Use: Never used  Substance and Sexual Activity  . Alcohol use: Not on file    Comment: rare  . Drug use: Never  . Sexual activity: Not on file  Other Topics Concern  . Not on file  Social History Narrative  . Not on file   Social Determinants of Health   Financial Resource Strain:   . Difficulty of Paying Living Expenses: Not on file  Food Insecurity:   . Worried About Charity fundraiser in the Last Year: Not on file  . Ran Out of Food in the Last Year: Not on file  Transportation Needs:   . Lack of Transportation (Medical): Not on file  . Lack of Transportation (Non-Medical): Not on file  Physical Activity:   . Days of Exercise per Week: Not on file  . Minutes of Exercise per Session: Not on file  Stress:   . Feeling of Stress : Not on file  Social Connections:   . Frequency of Communication with Friends and Family: Not on file  . Frequency of Social Gatherings with Friends and Family: Not on file  . Attends Religious Services: Not on file  . Active Member of Clubs or Organizations: Not on file  . Attends Archivist Meetings: Not on file  . Marital Status: Not on file      Review of Systems denies fever, headache, chest pain, dyspnea, cough, abdominal pain, vomiting or bleeding. He does have left shoulder/scapular discomfort, intermittent back pain and occasional nausea.  Vital Signs: BP (!) 148/64   Pulse (!) 59   Temp 98.3 F (36.8  C) (Oral)   Resp 18   SpO2 99%   Physical Exam awake, alert. Chest clear to auscultation bilaterally. Heart with regular rate and rhythm. Abdomen soft, positive bowel sounds, nontender. No lower extremity edema. Ecchymosis of left upper extremity noted. Soft tissue fullness/tenderness left scapular region  Imaging: DG Cervical Spine 2-3 Views  Result Date: 06/17/2020 CLINICAL DATA:  Intraoperative localization for spine surgery. EXAM: CERVICAL SPINE - 2-3 VIEW COMPARISON:  MRI cervical spine 05/28/2020 FINDINGS: Lateral spot films from the OR demonstrate a spinal needle on film 1 located at the C5-6 level. The second film shows a spinal needle at the C4-5 level. IMPRESSION: C4-5 and C5-6 localized. Electronically Signed   By: Marijo Sanes M.D.   On: 06/17/2020 19:33   CT Head Wo Contrast  Result Date: 06/26/2020 CLINICAL DATA:  Dizzy and fell into a door frame. Recent cervical spine surgery. EXAM: CT HEAD WITHOUT CONTRAST TECHNIQUE: Contiguous axial images were obtained from the base of the skull through the vertex without intravenous contrast. COMPARISON:  None. FINDINGS: Brain:  No evidence of acute infarction, hemorrhage, hydrocephalus, extra-axial collection or mass lesion/mass effect. Vascular: Calcified atherosclerosis at the skullbase. No hyperdense vessel. Skull: Negative for fracture. Osteolytic 1.3 x 1.8 x 1.1 cm low-density lesion in the left parietal bone involving the inner and outer tables (series 4, image 47). Additional smaller osteolytic lesions left parietal bone (series 3, image 39), and right parietal bone (series 3, image 62). Sinuses/Orbits: No acute finding. Other: None. IMPRESSION: 1. No acute intracranial abnormality. 2. Osteolytic lesions in the left parietal bone and right parietal bone, concerning for metastatic disease or multiple myeloma. Electronically Signed   By: Titus Dubin M.D.   On: 06/26/2020 16:36   CT Angio Chest PE W/Cm &/Or Wo Cm  Result Date:  06/26/2020 CLINICAL DATA:  Dizziness. Recent cervical spine surgery. Fall, scapular fracture. EXAM: CT ANGIOGRAPHY CHEST CT ABDOMEN AND PELVIS WITH CONTRAST TECHNIQUE: Multidetector CT imaging of the chest was performed using the standard protocol during bolus administration of intravenous contrast. Multiplanar CT image reconstructions and MIPs were obtained to evaluate the vascular anatomy. Multidetector CT imaging of the abdomen and pelvis was performed using the standard protocol during bolus administration of intravenous contrast. CONTRAST:  124m OMNIPAQUE IOHEXOL 350 MG/ML SOLN COMPARISON:  Left shoulder series earlier today. FINDINGS: CTA CHEST FINDINGS Cardiovascular: Is no filling defects in the pulmonary arteries to suggest pulmonary emboli. Coronary artery calcifications diffusely. Scattered aortic calcifications. No aneurysm. Heart is normal size. Mediastinum/Nodes: No mediastinal, hilar, or axillary adenopathy. Trachea and esophagus are unremarkable. Thyroid unremarkable. Lungs/Pleura: Linear bibasilar subsegmental atelectasis or scarring. No effusions. Musculoskeletal: A lytic expansile lesion within the left scapula. There is a fracture through the left scapula as well. There is a lytic expansile lytic lesion within the lateral left 4th rib. Soft tissue mass measures 6.4 x 4.0 cm. Lucent lesion seen within the T1 vertebral body. Multiple small lytic lesions noted throughout the remainder of the bones including right scapula, bilateral ribs, T11. Review of the MIP images confirms the above findings. CT ABDOMEN and PELVIS FINDINGS Hepatobiliary: No focal hepatic abnormality. Gallbladder unremarkable. Pancreas: No focal abnormality or ductal dilatation. Spleen: No focal abnormality.  Normal size. Adrenals/Urinary Tract: No adrenal abnormality. No focal renal abnormality. No stones or hydronephrosis. Urinary bladder is unremarkable. Small cyst in the lower pole of the right kidney. Stomach/Bowel: Normal  appendix. Stomach, large and small bowel grossly unremarkable. Vascular/Lymphatic: Aortic atherosclerosis. No evidence of aneurysm or adenopathy. Reproductive: Prostate enlargement. Other: No free fluid or free air. Musculoskeletal: Numerous lytic lesions throughout the lumbar spine and bony pelvis. Review of the MIP images confirms the above findings. IMPRESSION: Numerous diffuse low-density lesions throughout the bony structures of the chest abdomen or pelvis including bilateral scapula, multiple ribs, thoracic spine, lumbar spine and bony pelvis. Findings are concerning for metastasis or myeloma. Fracture through the left scapula, possibly pathologic fracture with the adjacent expansile lytic lesion in the left scapula. Expansile lytic lesion with soft tissue mass in the left lateral 4th rib. No evidence of pulmonary embolus. Coronary artery disease, aortic atherosclerosis. Prostate enlargement. Electronically Signed   By: KRolm BaptiseM.D.   On: 06/26/2020 18:34   CT Cervical Spine Wo Contrast  Result Date: 06/26/2020 CLINICAL DATA:  ACDF cervical spine 06/17/2020.  Fall today. EXAM: CT CERVICAL SPINE WITHOUT CONTRAST TECHNIQUE: Multidetector CT imaging of the cervical spine was performed without intravenous contrast. Multiplanar CT image reconstructions were also generated. COMPARISON:  MRI cervical spine 05/28/2020 FINDINGS: Alignment: Normal Skull base and vertebrae: Negative for  fracture Lytic lesion T1 vertebral body measuring 14 x 18 mm. Non sclerotic margins. No internal matrix. Lesion extends into the left pedicle. This lesion is seen on the recent MRI and is low signal on T1 and increased signal on T2 and STIR. Additional 5 x 7 mm lytic lesion in the right lamina of T1. Soft tissues and spinal canal: ACDF C5-6 from a left neck approach. Mild soft tissue edema in the wound and around the left thyroid. No hematoma. Mild prevertebral edema above the level fusion at C3 and C4 without hematoma. Disc  levels: C2-3: Moderate facet degeneration on the left with mild left foraminal narrowing. Small central disc protrusion. C3-4: Small central disc protrusion. Left-sided facet degeneration. Mild uncinate spurring without significant stenosis C4-5: Disc degeneration with diffuse uncinate spurring. Mild spinal stenosis. Neural foramina patent bilaterally C5-6: ACDF. Prominent anterior osteophytes cause the plate to be position anteriorly. No significant spinal stenosis. Neural foramina patent. C6-7: Disc degeneration with prominent central osteophyte. Mild spinal stenosis and mild foraminal stenosis bilaterally due to diffuse uncinate spurring C7-T1: Negative for stenosis. Upper chest: Visualized lung apices clear bilaterally. Other: None IMPRESSION: 1. Postop ACDF C5-6. Anterior plate and screws position anteriorly due to prominent anterior osteophyte. Typical postsurgical edema in the left neck. No hematoma or residual spinal stenosis 2. Cervical spondylosis with mild spinal stenosis at C4-5 and C6-7. Mild foraminal stenosis bilaterally C6-7. 3. 14 x 18 mm lytic lesion vertebral body T1. This is also noted on the prior MRI. A second small 5 x 7 mm lytic lesion in the right lamina of T1. These are concerning for neoplasm. Possible myeloma or metastatic disease. Follow-up MRI cervical spine without with contrast recommended for further evaluation. Electronically Signed   By: Franchot Gallo M.D.   On: 06/26/2020 18:48   CT Abdomen Pelvis W Contrast  Result Date: 06/26/2020 CLINICAL DATA:  Dizziness. Recent cervical spine surgery. Fall, scapular fracture. EXAM: CT ANGIOGRAPHY CHEST CT ABDOMEN AND PELVIS WITH CONTRAST TECHNIQUE: Multidetector CT imaging of the chest was performed using the standard protocol during bolus administration of intravenous contrast. Multiplanar CT image reconstructions and MIPs were obtained to evaluate the vascular anatomy. Multidetector CT imaging of the abdomen and pelvis was performed  using the standard protocol during bolus administration of intravenous contrast. CONTRAST:  170m OMNIPAQUE IOHEXOL 350 MG/ML SOLN COMPARISON:  Left shoulder series earlier today. FINDINGS: CTA CHEST FINDINGS Cardiovascular: Is no filling defects in the pulmonary arteries to suggest pulmonary emboli. Coronary artery calcifications diffusely. Scattered aortic calcifications. No aneurysm. Heart is normal size. Mediastinum/Nodes: No mediastinal, hilar, or axillary adenopathy. Trachea and esophagus are unremarkable. Thyroid unremarkable. Lungs/Pleura: Linear bibasilar subsegmental atelectasis or scarring. No effusions. Musculoskeletal: A lytic expansile lesion within the left scapula. There is a fracture through the left scapula as well. There is a lytic expansile lytic lesion within the lateral left 4th rib. Soft tissue mass measures 6.4 x 4.0 cm. Lucent lesion seen within the T1 vertebral body. Multiple small lytic lesions noted throughout the remainder of the bones including right scapula, bilateral ribs, T11. Review of the MIP images confirms the above findings. CT ABDOMEN and PELVIS FINDINGS Hepatobiliary: No focal hepatic abnormality. Gallbladder unremarkable. Pancreas: No focal abnormality or ductal dilatation. Spleen: No focal abnormality.  Normal size. Adrenals/Urinary Tract: No adrenal abnormality. No focal renal abnormality. No stones or hydronephrosis. Urinary bladder is unremarkable. Small cyst in the lower pole of the right kidney. Stomach/Bowel: Normal appendix. Stomach, large and small bowel grossly unremarkable. Vascular/Lymphatic: Aortic  atherosclerosis. No evidence of aneurysm or adenopathy. Reproductive: Prostate enlargement. Other: No free fluid or free air. Musculoskeletal: Numerous lytic lesions throughout the lumbar spine and bony pelvis. Review of the MIP images confirms the above findings. IMPRESSION: Numerous diffuse low-density lesions throughout the bony structures of the chest abdomen or  pelvis including bilateral scapula, multiple ribs, thoracic spine, lumbar spine and bony pelvis. Findings are concerning for metastasis or myeloma. Fracture through the left scapula, possibly pathologic fracture with the adjacent expansile lytic lesion in the left scapula. Expansile lytic lesion with soft tissue mass in the left lateral 4th rib. No evidence of pulmonary embolus. Coronary artery disease, aortic atherosclerosis. Prostate enlargement. Electronically Signed   By: Rolm Baptise M.D.   On: 06/26/2020 18:34   NM PET Image Initial (PI) Skull Base To Thigh  Result Date: 07/07/2020 CLINICAL DATA:  Initial treatment strategy for soft tissue mass in the LEFT scapula. EXAM: NUCLEAR MEDICINE PET SKULL BASE TO THIGH TECHNIQUE: 11.56 mCi F-18 FDG was injected intravenously. Full-ring PET imaging was performed from the skull base to thigh after the radiotracer. CT data was obtained and used for attenuation correction and anatomic localization. Fasting blood glucose: 106 mg/dl COMPARISON:  CT angiography of the chest FINDINGS: Mediastinal blood pool activity: SUV max 2.81 Liver activity: SUV max 4.06 NECK: No hypermetabolic lymph nodes in the neck. Diffuse muscular activity about the longus coli and longus capitis musculature. Incidental CT findings: Scalp lesions on the LEFT overlying the LEFT occipital parietal region showing some calcification largest approximately 10 x 6.2 mm. CHEST: Chest wall involvement, see below under the musculoskeletal section. No sign of adenopathy in the chest Incidental CT findings: Calcified atheromatous plaque and calcified coronary artery disease. Similar to previous imaging. Non-contrast appearance of heart and great vessels with limited assessment due to lack of intravenous contrast. Signs of basilar atelectasis. No effusion. No consolidation. Tiny pulmonary nodule in the RIGHT upper lobe measuring approximately 7 x 3 mm (image 19, series 8) no increased FDG uptake.  ABDOMEN/PELVIS: No abnormal hypermetabolic activity within the liver, pancreas, adrenal glands, or spleen. No hypermetabolic lymph nodes in the abdomen or pelvis. Incidental CT findings: Liver with normal size and contour. No pericholecystic stranding. No gross biliary duct dilation. No peripancreatic inflammation. Spleen normal in size and contour. No hydronephrosis. Small LEFT intrarenal calculus in the interpolar LEFT kidney. SKELETON: Multifocal areas of lytic and destructive changes. LEFT scapular lesion with associated soft tissue and bony destruction as well as pathologic fracture is unchanged with respect to size displaying a maximum SUV of 15.1 (image 64, series 4) Numerous rib lesions are present bilaterally displaying increased FDG uptake. The largest of these is a expansile lesion in the chest wall destroying the LEFT fourth rib and third ribs with lenticular morphology measuring approximately 5.8 x 3.6 cm, size could be altered based on changes and chest wall orientation. Overall similar to the previous study. (SUVmax = 5.8) RIGHT humeral lesions also with increased metabolic activity (image 57, series 4) 3 x 2.3 cm area of increased metabolic activity and destructive change (SUVmax = 5.8, adjacent smaller lesion with increased metabolic activity as well in the greater tuberosity 1.4 cm with a maximum SUV of 9.1) Subtle lytic foci in the proximal LEFT humerus as well with punched-out cortical lesions. RIGHT scapular lesion also seen on image 58, 1.7 cm also with increased metabolic activity. Innumerable other foci of lytic change throughout nearly every visualized level of the thoracic and lumbar spine. Area of greatest  involvement in the upper thoracic spine is the T1 vertebral body which was referenced on the previous cervical spine CT displaying early posterior cortical disruption. This area measures approximately 1.8 x 1.4 cm anterior plate and screw fixation of the cervical spine is just above this  level. T10 and T12 also display marked hypermetabolism associated with lytic changes (image 98, series 4) 12 mm area along the lateral cortex of the vertebral body with a maximum SUV of 6.1 T12 lesion on image 113 of series 4 measuring approximately 13-14 mm with a maximum SUV of 11.1. Diffuse activity within L2, associated with a large lytic focus that measures approximately 2.0 x 2.1 cm., marked hypermetabolic focus within the sacrum and multifocal lytic changes within the bilateral iliac bones, largest on the LEFT measuring approximately 2.9 x 1.3 cm All areas of the pelvis are involved to some extent with the iliac bones showing greatest involvement. LEFT femoral neck lesion at risk for pathologic fracture occupying nearly the entire LEFT femoral neck measuring 3.7 x 2.5 cm (image 187, series 4) (SUVmax = 4.9) signs of spinal fusion in the cervical spine. Relative sparing of the cervical spine Incidental CT findings: none IMPRESSION: Multifocal lytic bone lesions, all levels of thoracic and lumbar spine show involvement, lytic changes in the LEFT scapula with destruction and pathologic fracture and destruction of LEFT-sided ribs with dominant chest wall lesion as described. Multifocal involvement also involving the bilateral humeri and the bilateral femora most notably in the LEFT proximal femur where there is a large destructive lesion at risk for pathologic fracture involving nearly the entire LEFT femoral neck. Imaging characteristics are most suggestive of either myeloma or metastatic disease. Of primary neoplasms that could present in this fashion melanoma is the leading consideration. This is in the absence of a lung lesion which would explain these findings. Small RIGHT upper lobe pulmonary nodule measuring 7 x 4 mm is nonspecific, attention on follow-up. No signs of adenopathy or splenic enlargement. These results will be called to the ordering clinician or representative by the Radiologist Assistant,  and communication documented in the PACS or [employer]. Electronically Signed   By: Zetta Bills M.D.   On: 07/07/2020 15:36   DG Shoulder Left  Result Date: 06/26/2020 CLINICAL DATA:  Recent fall with left shoulder pain, initial encounter EXAM: LEFT SHOULDER - 2+ VIEW COMPARISON:  None. FINDINGS: Degenerative changes of the acromioclavicular joint are noted. Mild glenohumeral degenerative changes are seen as well. Comminuted scapular fracture is noted in the mid body. Additionally there is an erosive lesion involving the fourth left rib posterolaterally likely representing metastatic disease. Lucencies are noted within the proximal humeral shaft which are better visualized than that seen on prior exam also likely representing metastatic disease. CT of the chest is recommended for further evaluation. IMPRESSION: Comminuted fracture of the scapula. Given the findings described below this is felt to be a pathologic fracture. Multiple lucent lesions within the proximal left humerus consistent with metastatic disease. Erosive changes of the fourth left rib laterally with underlying soft tissue mass consistent with metastatic disease. CT of the chest is recommended for further evaluation. Electronically Signed   By: Inez Catalina M.D.   On: 06/26/2020 16:14    Labs:  CBC: Recent Labs    06/15/20 0926 06/26/20 1638 06/29/20 1453 07/09/20 0725  WBC 11.9* 13.4* 13.6* 9.1  HGB 15.4 14.9 13.8 13.3  HCT 48.3 45.0 41.0 39.3  PLT 255 289 270 319    COAGS: Recent  Labs    07/09/20 0725  INR 1.1    BMP: Recent Labs    06/15/20 0926 06/26/20 1638 06/29/20 1453 07/09/20 0725  NA 140 137 137 137  K 4.2 4.4 4.5 3.6  CL 109 102 99 108  CO2 '22 26 31 23  ' GLUCOSE 124* 131* 124* 116*  BUN '9 19 22 14  ' CALCIUM 10.2 11.1* 12.8* 8.1*  CREATININE 1.04 1.19 1.61* 0.96  GFRNONAA >60 >60 43* >60  GFRAA >60 >60 50* >60    LIVER FUNCTION TESTS: Recent Labs    06/26/20 1638 06/29/20 1453   BILITOT 0.7 0.7  AST 22 17  ALT 39 30  ALKPHOS 130* 142*  PROT 6.8 6.4*  ALBUMIN 3.7 4.0    TUMOR MARKERS: No results for input(s): AFPTM, CEA, CA199, CHROMGRNA in the last 8760 hours.  Assessment and Plan:  69 y.o. 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 He underwent C5/6 ACDF on 06/17/2020. He recently fell at home and began to experience pain in the left shoulder/shoulder blade. Subsequent imaging has revealed: Multifocal lytic bone lesions, all levels of thoracic and lumbar spine show involvement, lytic changes in the LEFT scapula with destruction and pathologic fracture and destruction of LEFT-sided ribs with dominant chest wall lesion as described.  Multifocal involvement also involving the bilateral humeri and the bilateral femora most notably in the LEFT proximal femur where there is a large destructive lesion at risk for pathologic fracture involving nearly the entire LEFT femoral neck.  Imaging characteristics are most suggestive of either myeloma or metastatic disease. Of primary neoplasms that could present in this fashion melanoma is the leading consideration. This is in the absence of a lung lesion which would explain these findings.  Small RIGHT upper lobe pulmonary nodule measuring 7 x 4 mm is nonspecific, attention on follow-up.  No signs of adenopathy or splenic enlargement.  Laboratory studies have revealed elevated free kappa light chains and kappa lambda ratio. No M spike on SPEP. He has no known history of cancer. He presents today for CT-guided biopsy of the lytic mass of the left scapula for further evaluation.Risks and benefits of procedure was discussed with the patient  including, but not limited to bleeding, infection, damage to adjacent structures or low yield requiring additional tests.  All of the questions were answered and there is agreement to proceed.  Consent signed and in  chart.      Thank you for this interesting consult.  I greatly enjoyed meeting Bruce Smith and look forward to participating in their care.  A copy of this report was sent to the requesting provider on this date.  Electronically Signed: D. Rowe Robert, PA-C 07/09/2020, 8:37 AM   I spent a total of 25 minutes in face to face in clinical consultation, greater than 50% of which was counseling/coordinating care for CT-guided biopsy of lytic mass left scapula

## 2020-07-09 NOTE — Discharge Instructions (Addendum)
Urgent needs MD on call [phone]  Biopsy Discharge Instructions  The procedure you just had is called a biopsy.  You may feel some discomfort after the local anesthetic wears off.  Your discomfort should improve over the next several days.  AFTER YOUR BIOPSY  Rest for the remainder of the day.  Avoid heavy lifting (more than 10 lb/4.5 kg).  If you have been given a general anesthetic or other medications to help you relax, you should not operate machinery, drive or make legal decisions for 24 hours after your procedure.  Additionally, someone must be available to drive you home.  Only take over-the-counter or prescription medicines for pain, discomfort, or fever as directed by your caregiver.  This can make bleeding worse.  You may resume your usual diet after the procedure.  Avoid alcoholic beverages for 24 hours after your procedure.  Keep the skin around your biopsy site clean and dry.  You may remove dressing shower after 24 hours.  Cleanse and dry the biopsy site completely after you shower.  Avoid baths and swimming for 72 hours or until site is healing well.  Complications are very uncommon after this procedure.  Go to the nearest Emergency Department or contact your caregiver if you develop any of the following symptoms:  Worsening pain  Bleeding  Swelling at the biopsy site  Light headedness or dizziness  Shortness of Breath  Fever or chills  Redness or increased pain or swelling at the biopsy site     Moderate Conscious Sedation, Adult, Care After These instructions provide you with information about caring for yourself after your procedure. Your health care provider may also give you more specific instructions. Your treatment has been planned according to current medical practices, but problems sometimes occur. Call your health care provider if you have any problems or questions after your procedure. What can I expect after the procedure? After your  procedure, it is common:  To feel sleepy for several hours.  To feel clumsy and have poor balance for several hours.  To have poor judgment for several hours.  To vomit if you eat too soon. Follow these instructions at home: For at least 24 hours after the procedure:  Do not: ? Participate in activities where you could fall or become injured. ? Drive. ? Use heavy machinery. ? Drink alcohol. ? Take sleeping pills or medicines that cause drowsiness. ? Make important decisions or sign legal documents. ? Take care of children on your own.  Rest. Eating and drinking  Follow the diet recommended by your health care provider.  If you vomit: ? Drink water, juice, or soup when you can drink without vomiting. ? Make sure you have little or no nausea before eating solid foods. General instructions  Have a responsible adult stay with you until you are awake and alert.  Take over-the-counter and prescription medicines only as told by your health care provider.  If you smoke, do not smoke without supervision.  Keep all follow-up visits as told by your health care provider. This is important. Contact a health care provider if:  You keep feeling nauseous or you keep vomiting.  You feel light-headed.  You develop a rash.  You have a fever. Get help right away if:  You have trouble breathing. This information is not intended to replace advice given to you by your health care provider. Make sure you discuss any questions you have with your health care provider. Document Revised: 09/29/2017 Document Reviewed: 02/06/2016 Elsevier  Patient Education  [employer].

## 2020-07-10 ENCOUNTER — Other Ambulatory Visit (HOSPITAL_COMMUNITY)
Admission: RE | Admit: 2020-07-10 | Discharge: 2020-07-10 | Disposition: A | Discharge status: Home / Self Care | Payer: BC Managed Care – PPO | Source: Ambulatory Visit | Attending: Orthopedic Surgery | Admitting: Orthopedic Surgery

## 2020-07-10 DIAGNOSIS — Z20822 Contact with and (suspected) exposure to covid-19: Secondary | ICD-10-CM | POA: Insufficient documentation

## 2020-07-10 DIAGNOSIS — Z01812 Encounter for preprocedural laboratory examination: Secondary | ICD-10-CM | POA: Diagnosis not present

## 2020-07-10 LAB — SARS CORONAVIRUS 2 (TAT 6-24 HRS): SARS Coronavirus 2: NEGATIVE

## 2020-07-13 ENCOUNTER — Encounter (HOSPITAL_COMMUNITY): Payer: Self-pay | Admitting: Orthopedic Surgery

## 2020-07-13 ENCOUNTER — Other Ambulatory Visit: Payer: Self-pay

## 2020-07-13 MED ORDER — VANCOMYCIN HCL 1500 MG/300ML IV SOLN
1500.0000 mg | INTRAVENOUS | Status: AC
  Administered 2020-07-14: 1500 mg via INTRAVENOUS
  Filled 2020-07-13: qty 300

## 2020-07-13 MED ORDER — VANCOMYCIN HCL 1500 MG/300ML IV SOLN
1500.0000 mg | INTRAVENOUS | Status: DC
  Filled 2020-07-13: qty 300

## 2020-07-13 NOTE — Anesthesia Preprocedure Evaluation (Addendum)
Anesthesia Evaluation  Patient identified by MRN, date of birth, ID band Patient awake    Reviewed: Allergy & Precautions, NPO status , Patient's Chart, lab work & pertinent test results  History of Anesthesia Complications Negative for: history of anesthetic complications  Airway Mallampati: III  TM Distance: >3 FB Neck ROM: Full    Dental  (+) Dental Advisory Given   Pulmonary neg shortness of breath, neg sleep apnea, neg COPD, neg recent URI, former smoker,  Covid-19 Nucleic Acid Test Results Lab Results      Component                Value               Date                      SARSCOV2NAA              NEGATIVE            07/10/2020                [place]              NEGATIVE            06/15/2020              breath sounds clear to auscultation       Cardiovascular hypertension, + CAD, + Past MI and + Cardiac Stents   Rhythm:Regular     Neuro/Psych negative neurological ROS  negative psych ROS   GI/Hepatic negative GI ROS, Neg liver ROS,   Endo/Other  negative endocrine ROS  Renal/GU Lab Results      Component                Value               Date                      CREATININE               0.96                07/09/2020             Lab Results      Component                Value               Date                      K                        3.6                 07/09/2020                Musculoskeletal negative musculoskeletal ROS (+)   Abdominal   Peds  Hematology Lab Results      Component                Value               Date                      WBC  9.1                 07/09/2020                HGB                      13.3                07/09/2020                HCT                      39.3                07/09/2020                MCV                      87.3                07/09/2020                PLT                      319                 07/09/2020             Lab Results      Component                Value               Date                      INR                      1.1                 07/09/2020              Anesthesia Other Findings   Reproductive/Obstetrics                           Anesthesia Physical Anesthesia Plan  ASA: II  Anesthesia Plan: MAC and Spinal   Post-op Pain Management:    Induction:   PONV Risk Score and Plan: 1 and Treatment may vary due to age or medical condition and Propofol infusion  Airway Management Planned: Nasal Cannula  Additional Equipment: None  Intra-op Plan:   Post-operative Plan: Extubation in OR  Informed Consent: I have reviewed the patients History and Physical, chart, labs and discussed the procedure including the risks, benefits and alternatives for the proposed anesthesia with the patient or authorized representative who has indicated his/her understanding and acceptance.     Dental advisory given  Plan Discussed with: CRNA and Surgeon  Anesthesia Plan Comments: (See APP note by Durel Salts, FNP )       Anesthesia Quick Evaluation

## 2020-07-13 NOTE — Progress Notes (Signed)
Spoke with pt and his wife, Maudie Mercury for pre-op call. Pt has hx of CAD with stents. Pt is on Effient and Aspirin. Pt had surgery on 06/17/20 and had held both prior to that surgery, he states he started both of them back on 07/03/20, but found out same day that he was having this surgery. He took one dose of each between surgeries.  See Karoline Caldwell' note from 06/15/20.   Pt states he's had some difficulty swallowing since he had his neck surgery in August, states food sometimes gets stuck.   Covid test done on 07/10/20 and it's negative. He states he's been in quarantine since the test was done and will stay in it until he comes for surgery tomorrow.

## 2020-07-13 NOTE — Progress Notes (Signed)
Anesthesia Chart Review:  Pt is a same day work up    Case: [id number] Date/Time: 07/14/20 1315   Procedure: OPEN REDUCTION INTERNAL FIXATION (ORIF LEFT FEMORAL NAIL FRACTURE (Left )   Anesthesia type: Choice   Pre-op diagnosis: LEFT HIP FRACTURE   Location: [facility] ROOM 05 / [facility]   Surgeons: Marchia Bond, MD      DISCUSSION: Pt is a 69 year old with hx CAD (stent to LAD and RCA 2013; stent to PDA 2017), HTN.   Pt underwent ACDF on 06/17/20 without anesthesia complication. Pt had cardiac clearance for that surgery   After fall in late August, pt was found to have multiple lytic bone lesions. Malignancy is suspected; pathology from biopsy pending.   Pt restarted effient and ASA on 07/03/20 (after being off for ACDF in August) then then stopped these meds again 07/04/20.    PROVIDERS: - PCP is Derrill Center., MD - Cardiologist is Mathis Bud, MD. Last office visit 11/12/19 with Estevan Ryder, PA (notes in care everywhere)  - Oncologist is Burney Gauze, MD. Initial visit 06/29/20   LABS:  [place] Blood, CBC with diff, and BMP from 07/09/20 all within normal limits except glucose 116, Ca 8.1   IMAGES: CT angio chest, CT abd/pelvis 06/26/20:  - Numerous diffuse low-density lesions throughout the bony structures of the chest abdomen or pelvis including bilateral scapula, multiple ribs, thoracic spine, lumbar spine and bony pelvis. Findings are concerning for metastasis or myeloma. - Fracture through the left scapula, possibly pathologic fracture with the adjacent expansile lytic lesion in the left scapula. - Expansile lytic lesion with soft tissue mass in the left lateral 4th rib. - No evidence of pulmonary embolus. - Coronary artery disease, aortic atherosclerosis. - Prostate enlargement.   CT head 06/26/20:  1. No acute intracranial abnormality. 2. Osteolytic lesions in the left parietal bone and right parietal bone, concerning for metastatic disease or multiple myeloma.    CT  cervical spine 06/26/20:  1. Postop ACDF C5-6. Anterior plate and screws position anteriorly due to prominent anterior osteophyte. Typical postsurgical edema in the left neck. No hematoma or residual spinal stenosis  2. Cervical spondylosis with mild spinal stenosis at C4-5 and C6-7. Mild foraminal stenosis bilaterally C6-7. 3. 14 x 18 mm lytic lesion vertebral body T1. This is also noted on the prior MRI. A second small 5 x 7 mm lytic lesion in the right lamina of T1. These are concerning for neoplasm.    EKG 06/26/20: Sinus rhythm. Borderline left axis deviation   CV: Cardiac cath 05/09/16 (care everywhere): - RCA and LAD stents patent.  - 50% in LAD stent narrowing as before.  - Ostial Cx 50% as before.  - PDA has mid 90% stenosis. S/p DES to PDA.  - Normal LV function. EF 70%  Diagnostic Recommendations  - PCI recommended after discussion with patient.     Past Medical History:  Diagnosis Date  . Atherosclerosis   . Goals of care, counseling/discussion 06/29/2020  . 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 Hyperlipidemia   . Hypertension   . 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 Myocardial infarction [facility])     Past Surgical History:  Procedure Laterality Date  . ANTERIOR CERVICAL DECOMP/DISCECTOMY FUSION N/A 06/17/2020   Procedure: ANTERIOR CERVICAL DECOMPRESSION/DISCECTOMY FUSION, INTERBODY PROSTHESIS, PLATE/SCREWS CERVICAL FIVE- CERVICAL SIX;  Surgeon: Newman Pies, MD;  Location: [facility];  Service: Neurosurgery;  Laterality: N/A;  ANTERIOR CERVICAL DECOMPRESSION/DISCECTOMY FUSION, INTERBODY PROSTHESIS,  PLATE/SCREWS CERVICAL FIVE- CERVICAL SIX  . CARDIAC CATHETERIZATION      MEDICATIONS: No current facility-administered medications for this encounter.   Marland Kitchen acetaminophen (TYLENOL) 500 MG tablet  . aspirin EC 81 MG tablet  . atorvastatin (LIPITOR) 20 MG tablet  . Coenzyme Q10 (CO Q-10) 200 MG CAPS  . cyclobenzaprine (FLEXERIL) 10 MG tablet   . lisinopril (ZESTRIL) 5 MG tablet  . metoprolol succinate (TOPROL-XL) 50 MG 24 hr tablet  . nitroGLYCERIN (NITROSTAT) 0.4 MG SL tablet  . Oxycodone HCl 10 MG TABS  . Polyethylene Glycol 3350 (MIRALAX PO)  . prasugrel (EFFIENT) 10 MG TABS tablet  . pregabalin (LYRICA) 150 MG capsule   - Pt restarted effient and ASA on 07/03/20 (after being off for ACDF in August) then then stopped these meds again 07/04/20.    If no changes, I anticipate pt can proceed with surgery as scheduled.   Willeen Cass, PhD, FNP-BC [facility]/Anesthesiology Phone: [phone] 07/13/2020 10:29 AM

## 2020-07-14 ENCOUNTER — Encounter (HOSPITAL_COMMUNITY): Payer: Self-pay | Admitting: Orthopedic Surgery

## 2020-07-14 ENCOUNTER — Ambulatory Visit (HOSPITAL_COMMUNITY): Payer: BC Managed Care – PPO | Admitting: Emergency Medicine

## 2020-07-14 ENCOUNTER — Other Ambulatory Visit: Payer: Self-pay

## 2020-07-14 ENCOUNTER — Ambulatory Visit (HOSPITAL_COMMUNITY)
Admission: RE | Admit: 2020-07-14 | Discharge: 2020-07-14 | Disposition: A | Discharge status: Home / Self Care | Payer: BC Managed Care – PPO | Attending: Orthopedic Surgery | Admitting: Orthopedic Surgery

## 2020-07-14 ENCOUNTER — Ambulatory Visit (HOSPITAL_COMMUNITY): Payer: BC Managed Care – PPO

## 2020-07-14 ENCOUNTER — Encounter (HOSPITAL_COMMUNITY)
Admission: RE | Disposition: A | Discharge status: Home / Self Care | Payer: Self-pay | Source: Home / Self Care | Attending: Orthopedic Surgery

## 2020-07-14 DIAGNOSIS — Z9889 Other specified postprocedural states: Secondary | ICD-10-CM

## 2020-07-14 DIAGNOSIS — E785 Hyperlipidemia, unspecified: Secondary | ICD-10-CM | POA: Insufficient documentation

## 2020-07-14 DIAGNOSIS — Z79899 Other long term (current) drug therapy: Secondary | ICD-10-CM | POA: Diagnosis not present

## 2020-07-14 DIAGNOSIS — Z87891 Personal history of nicotine dependence: Secondary | ICD-10-CM | POA: Insufficient documentation

## 2020-07-14 DIAGNOSIS — Z981 Arthrodesis status: Secondary | ICD-10-CM | POA: Insufficient documentation

## 2020-07-14 DIAGNOSIS — Z8781 Personal history of (healed) traumatic fracture: Secondary | ICD-10-CM

## 2020-07-14 DIAGNOSIS — Z79891 Long term (current) use of opiate analgesic: Secondary | ICD-10-CM | POA: Diagnosis not present

## 2020-07-14 DIAGNOSIS — I10 Essential (primary) hypertension: Secondary | ICD-10-CM | POA: Diagnosis not present

## 2020-07-14 DIAGNOSIS — M899 Disorder of bone, unspecified: Secondary | ICD-10-CM | POA: Insufficient documentation

## 2020-07-14 DIAGNOSIS — Z955 Presence of coronary angioplasty implant and graft: Secondary | ICD-10-CM | POA: Diagnosis not present

## 2020-07-14 DIAGNOSIS — C419 Malignant neoplasm of bone and articular cartilage, unspecified: Secondary | ICD-10-CM | POA: Diagnosis not present

## 2020-07-14 DIAGNOSIS — I2510 Atherosclerotic heart disease of native coronary artery without angina pectoris: Secondary | ICD-10-CM | POA: Insufficient documentation

## 2020-07-14 DIAGNOSIS — I252 Old myocardial infarction: Secondary | ICD-10-CM | POA: Insufficient documentation

## 2020-07-14 DIAGNOSIS — T148XXA Other injury of unspecified body region, initial encounter: Secondary | ICD-10-CM

## 2020-07-14 HISTORY — PX: FEMUR IM NAIL: SHX1597

## 2020-07-14 HISTORY — DX: Atherosclerotic heart disease of native coronary artery without angina pectoris: I25.10

## 2020-07-14 IMAGING — RF DG C-ARM 1-60 MIN
1 series · 6 of 6 positions shown · non-contrast
Comparison: None.

CLINICAL DATA: ORIF of left femur fracture

EXAM:
LEFT FEMUR 2 VIEWS; DG C-ARM 1-60 MIN

[Series 1: run · 6 of 6 slices shown]
[im 1/6]
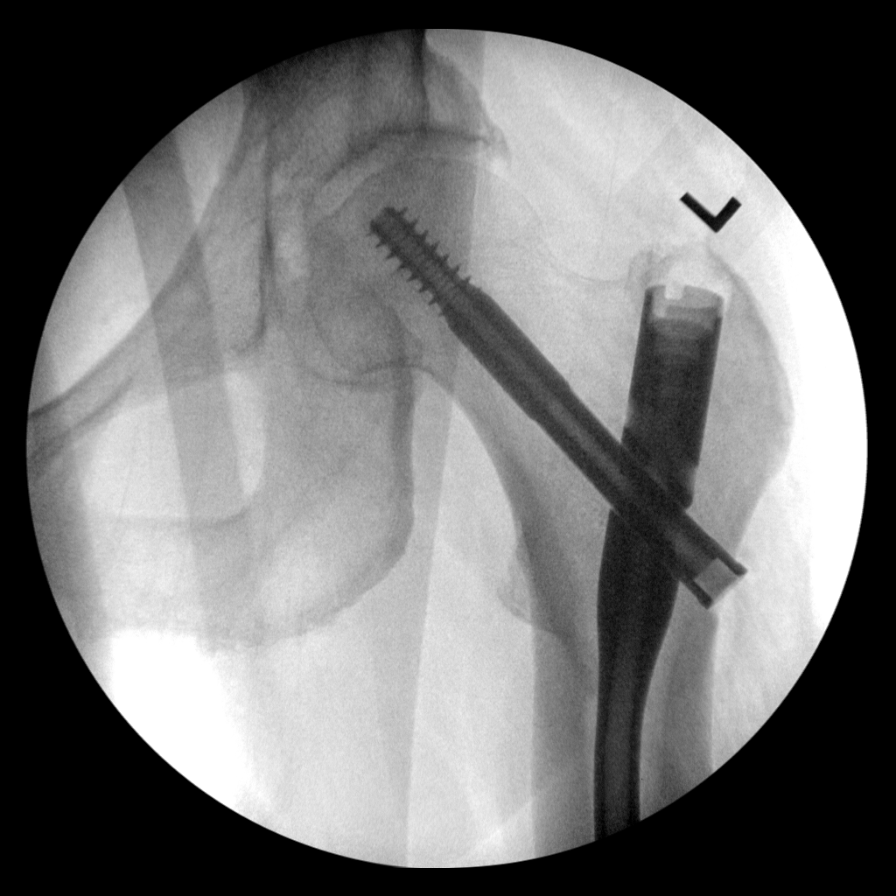
[im 2/6]
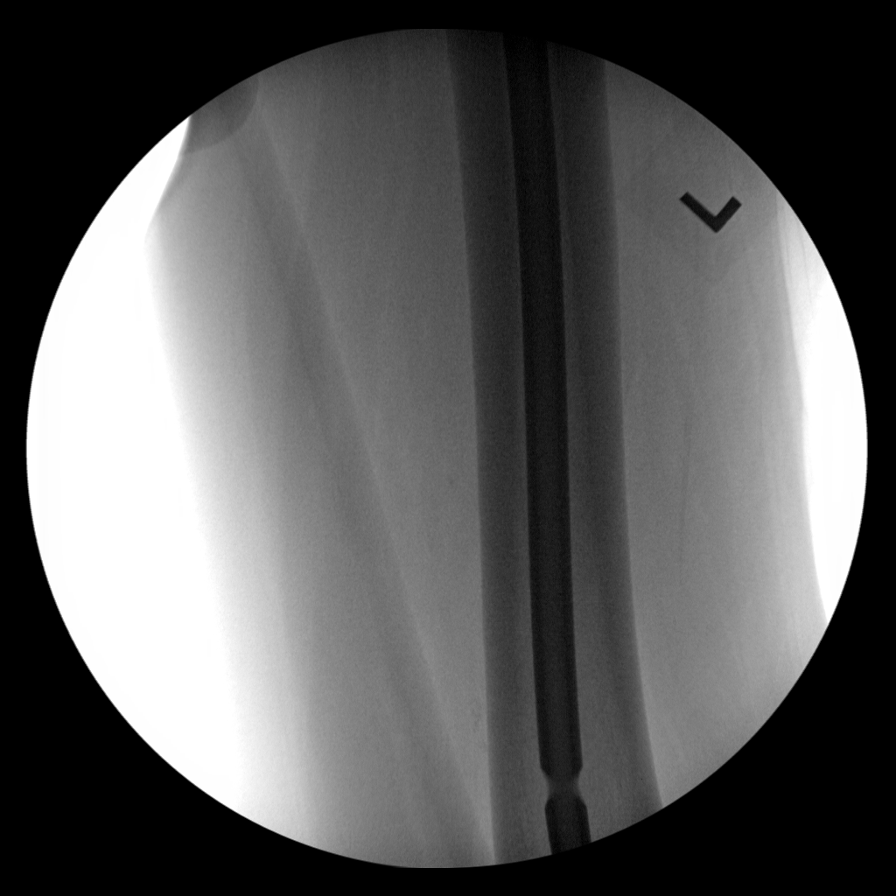
[im 3/6]
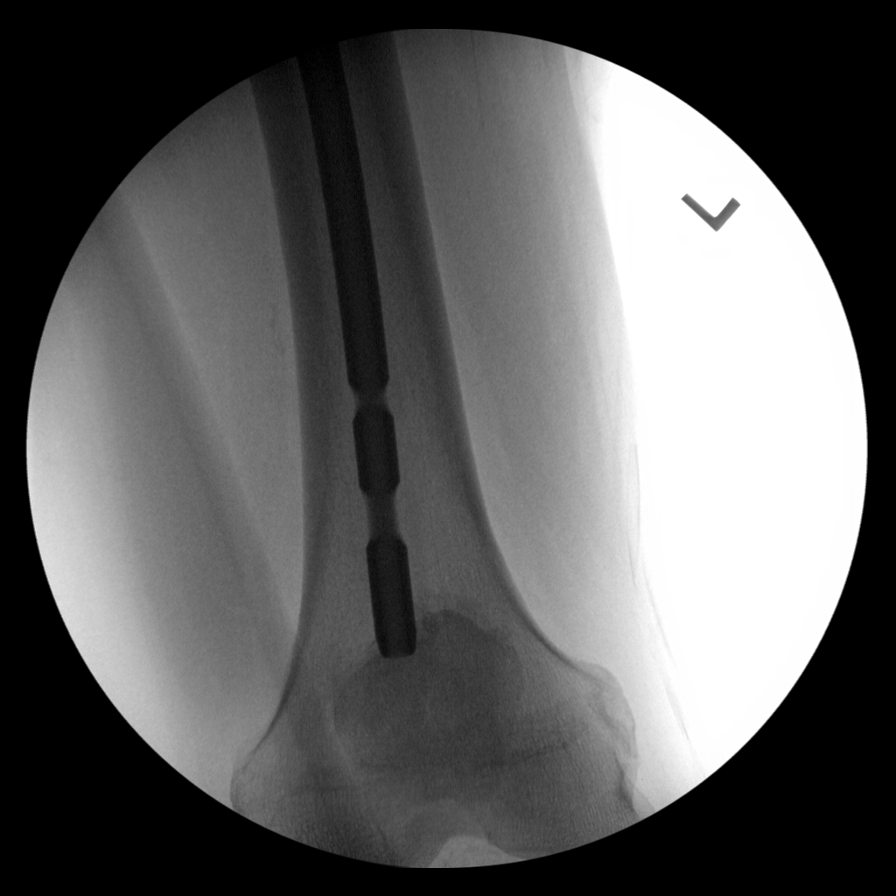
[im 4/6]
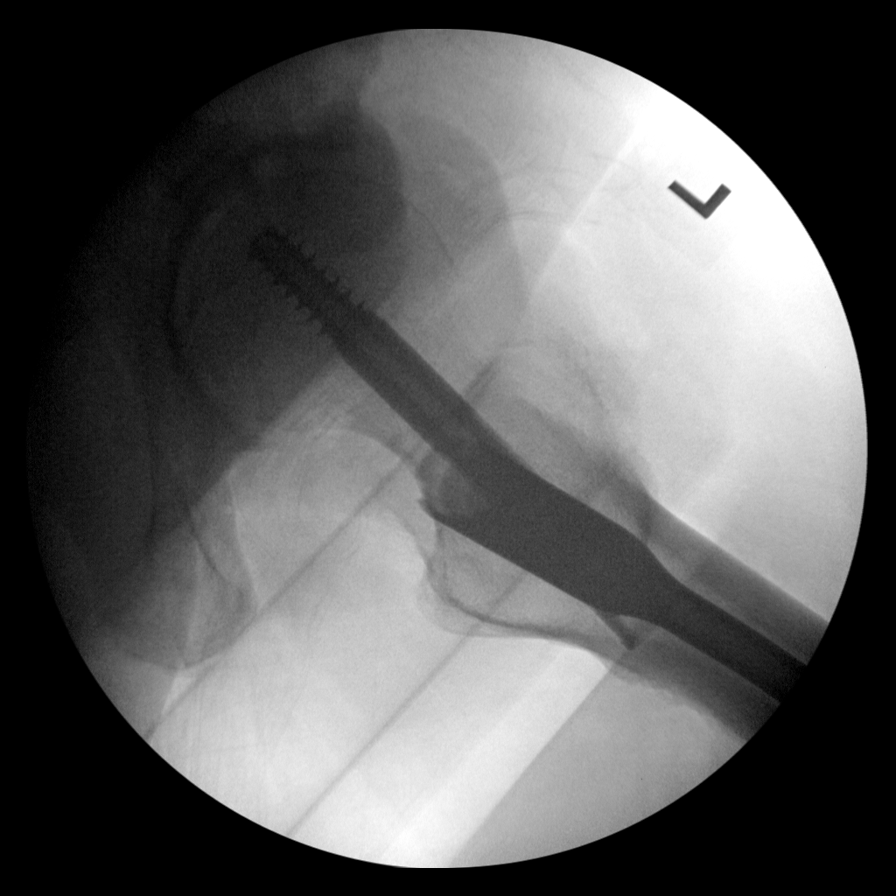
[im 5/6]
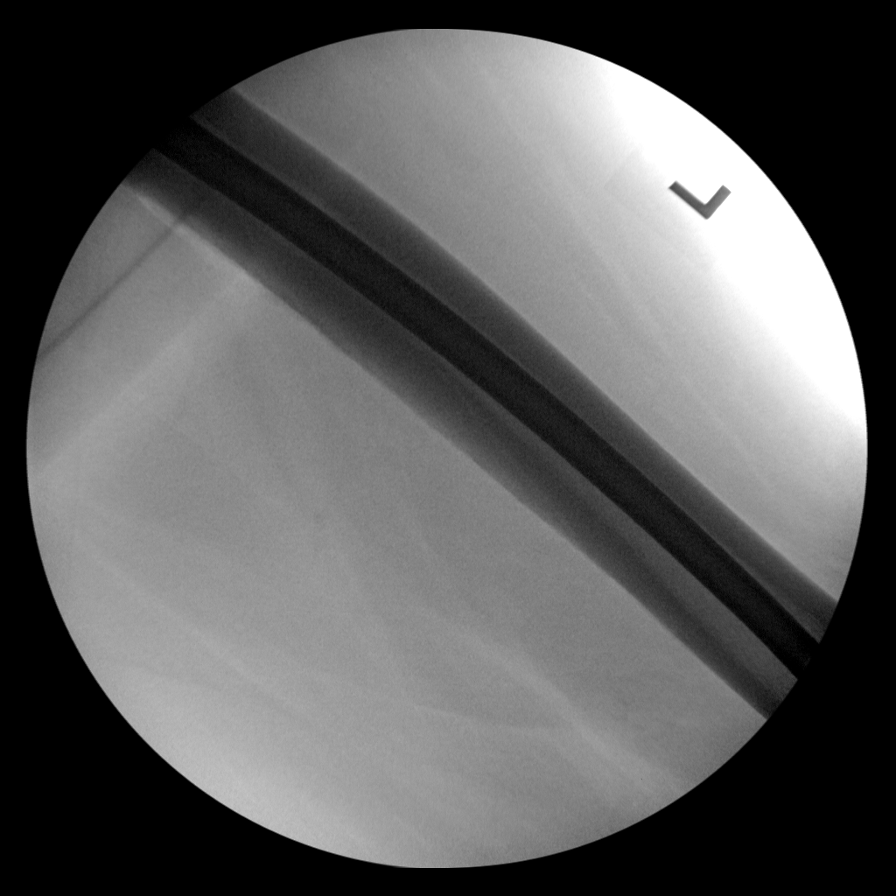
[im 6/6]
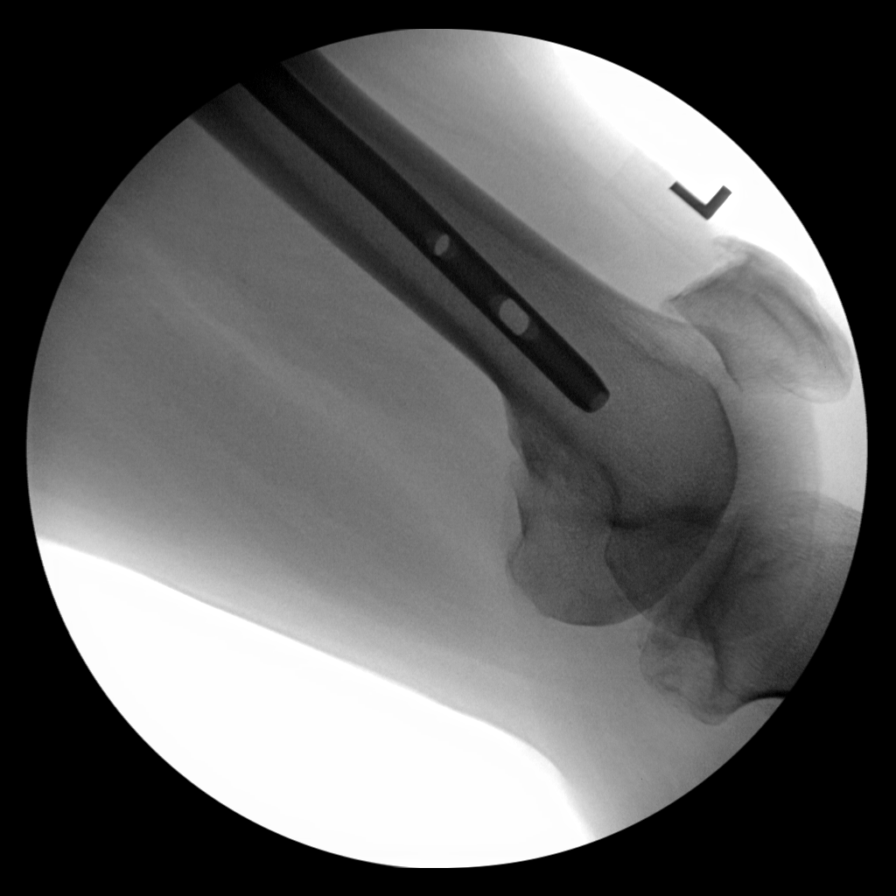

[6 of 6 positions shown; findings below may reference images not displayed]

FINDINGS: The patient is status post ORIF with intramedullary rod and screw
fixation the femur fracture. Six intraop views were submitted for
review. Fluoro time 1 minutes 39 seconds
IMPRESSION: Intraop views of ORIF of proximal femur fracture.

## 2020-07-14 IMAGING — RF DG FEMUR 2+V*L*
1 series · 6 of 6 positions shown · non-contrast
Comparison: None.

CLINICAL DATA: ORIF of left femur fracture

EXAM:
LEFT FEMUR 2 VIEWS; DG C-ARM 1-60 MIN

[Series 1: run · 6 of 6 slices shown]
[im 1/6]
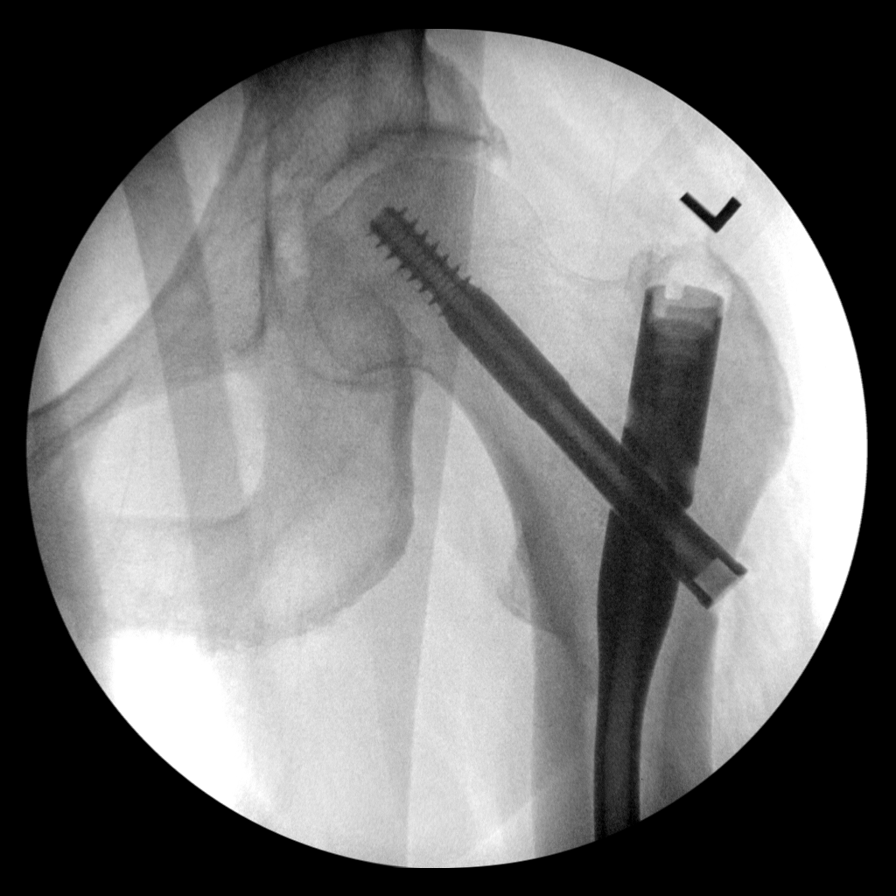
[im 2/6]
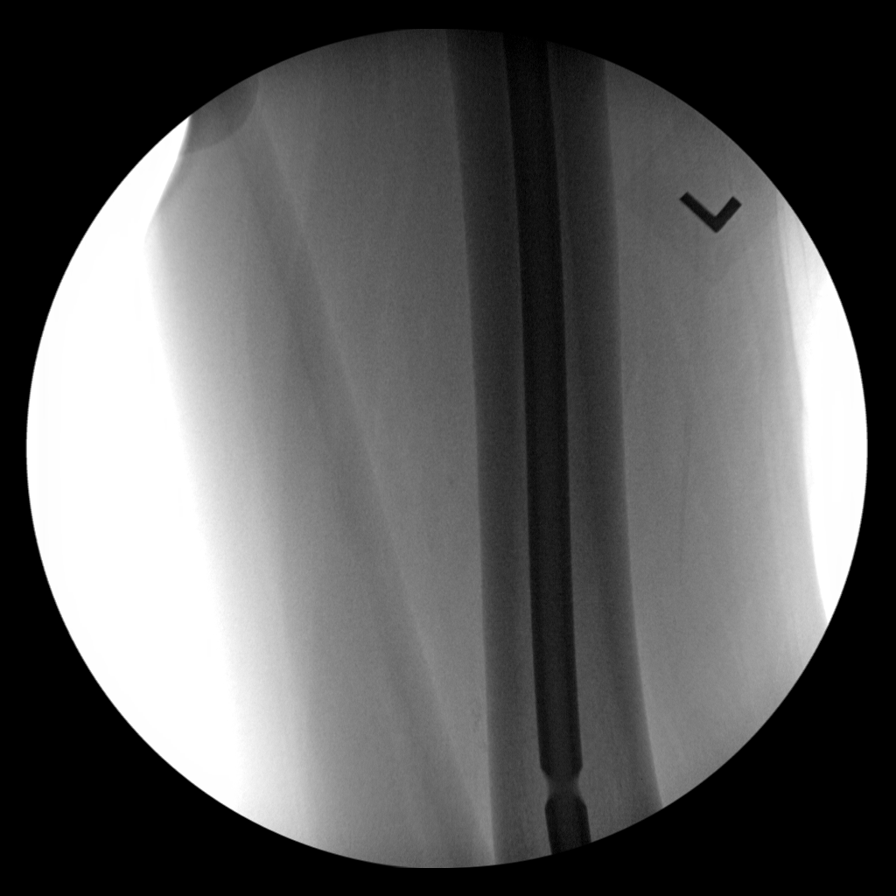
[im 3/6]
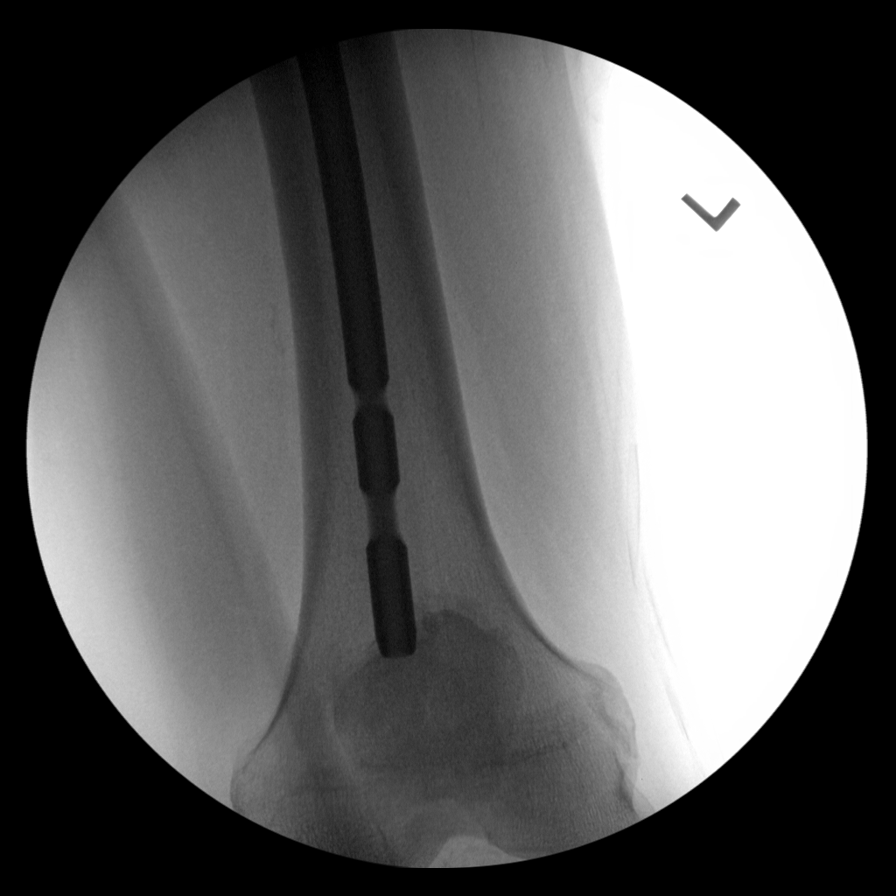
[im 4/6]
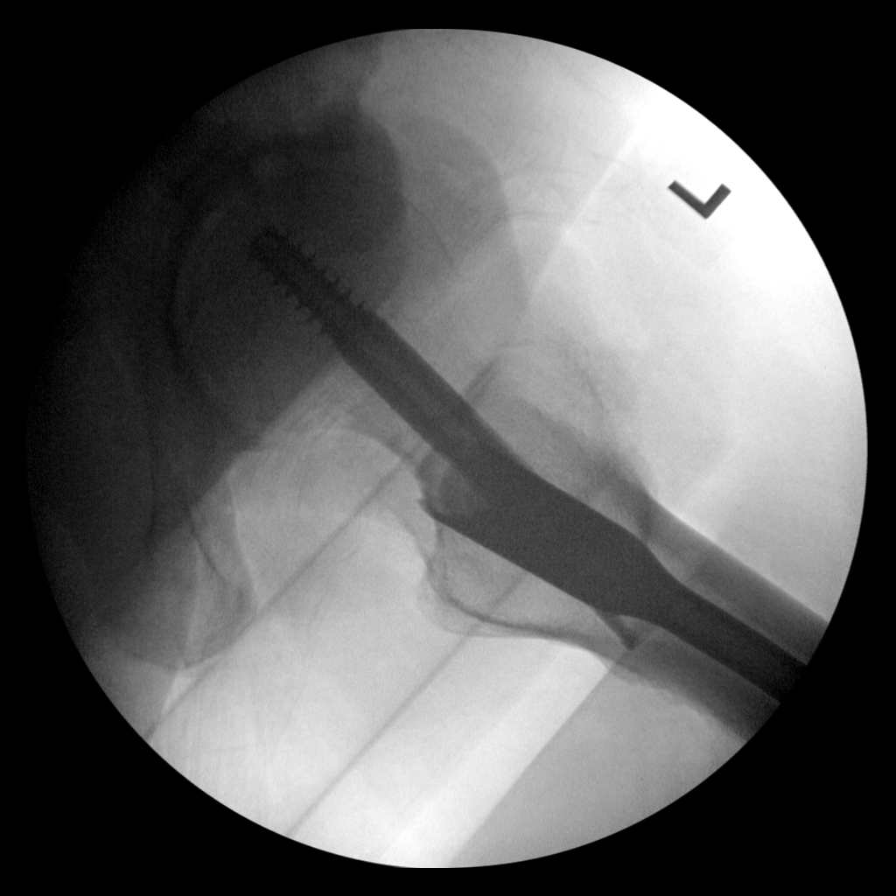
[im 5/6]
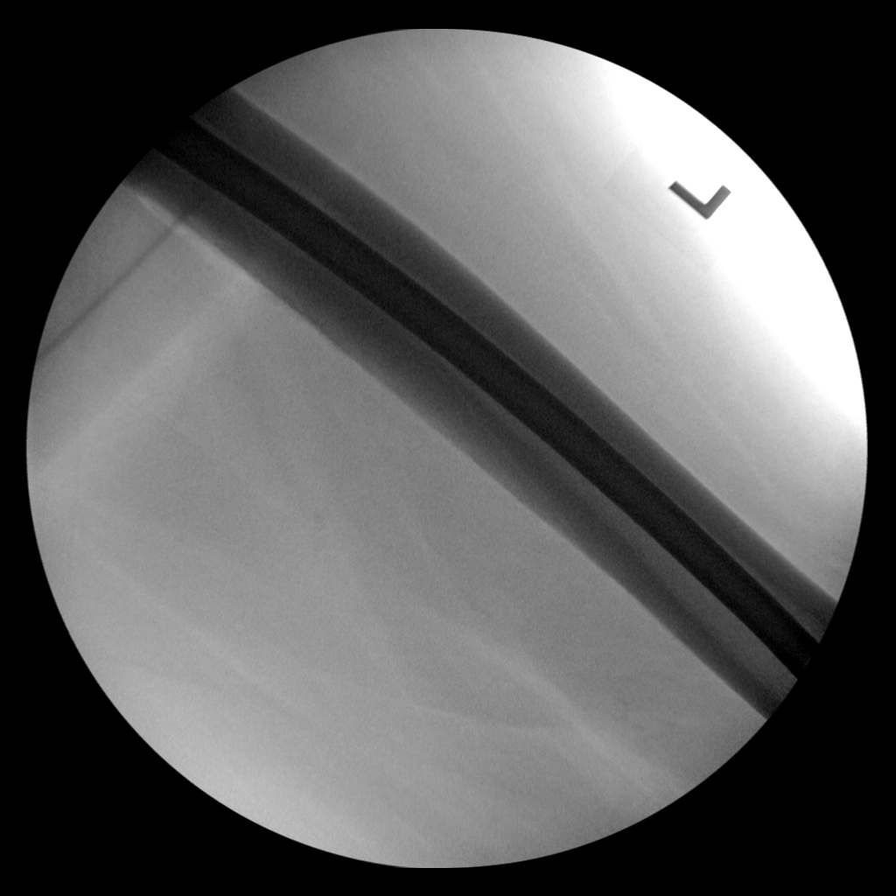
[im 6/6]
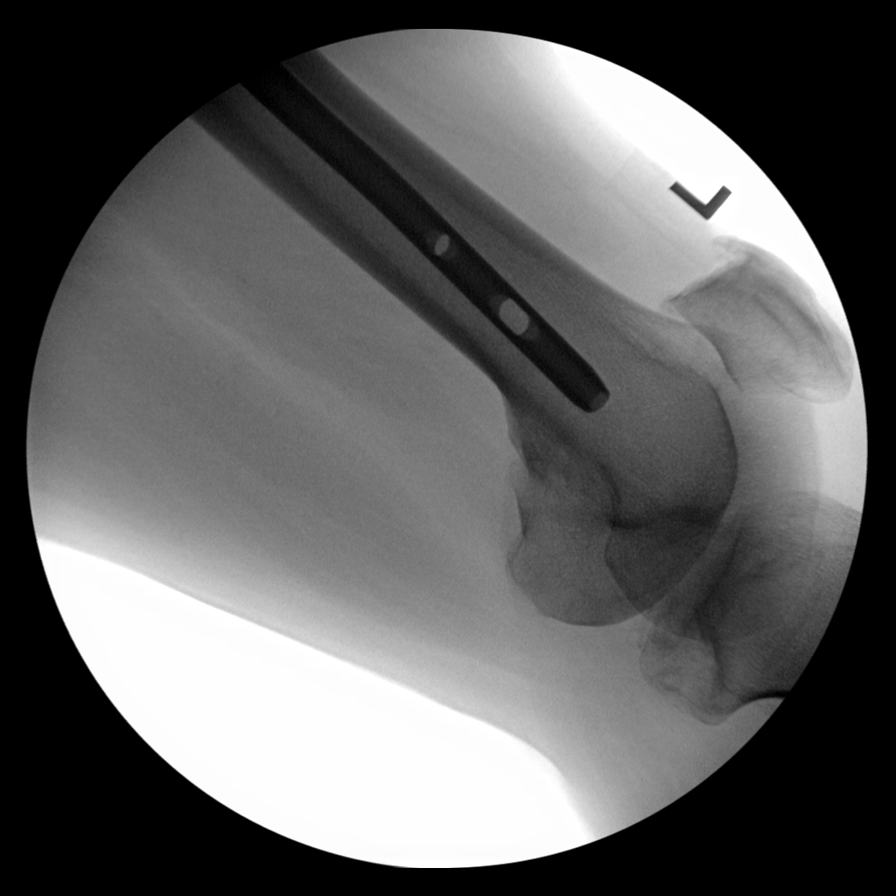

[6 of 6 positions shown; findings below may reference images not displayed]

FINDINGS: The patient is status post ORIF with intramedullary rod and screw
fixation the femur fracture. Six intraop views were submitted for
review. Fluoro time 1 minutes 39 seconds
IMPRESSION: Intraop views of ORIF of proximal femur fracture.

## 2020-07-14 IMAGING — CR DG PORTABLE PELVIS
1 series · 1 of 1 positions shown · non-contrast
Comparison: PET-CT [DATE]

CLINICAL DATA: Open reduction internal fixation

EXAM:
PORTABLE PELVIS 1-2 VIEWS

[AP]
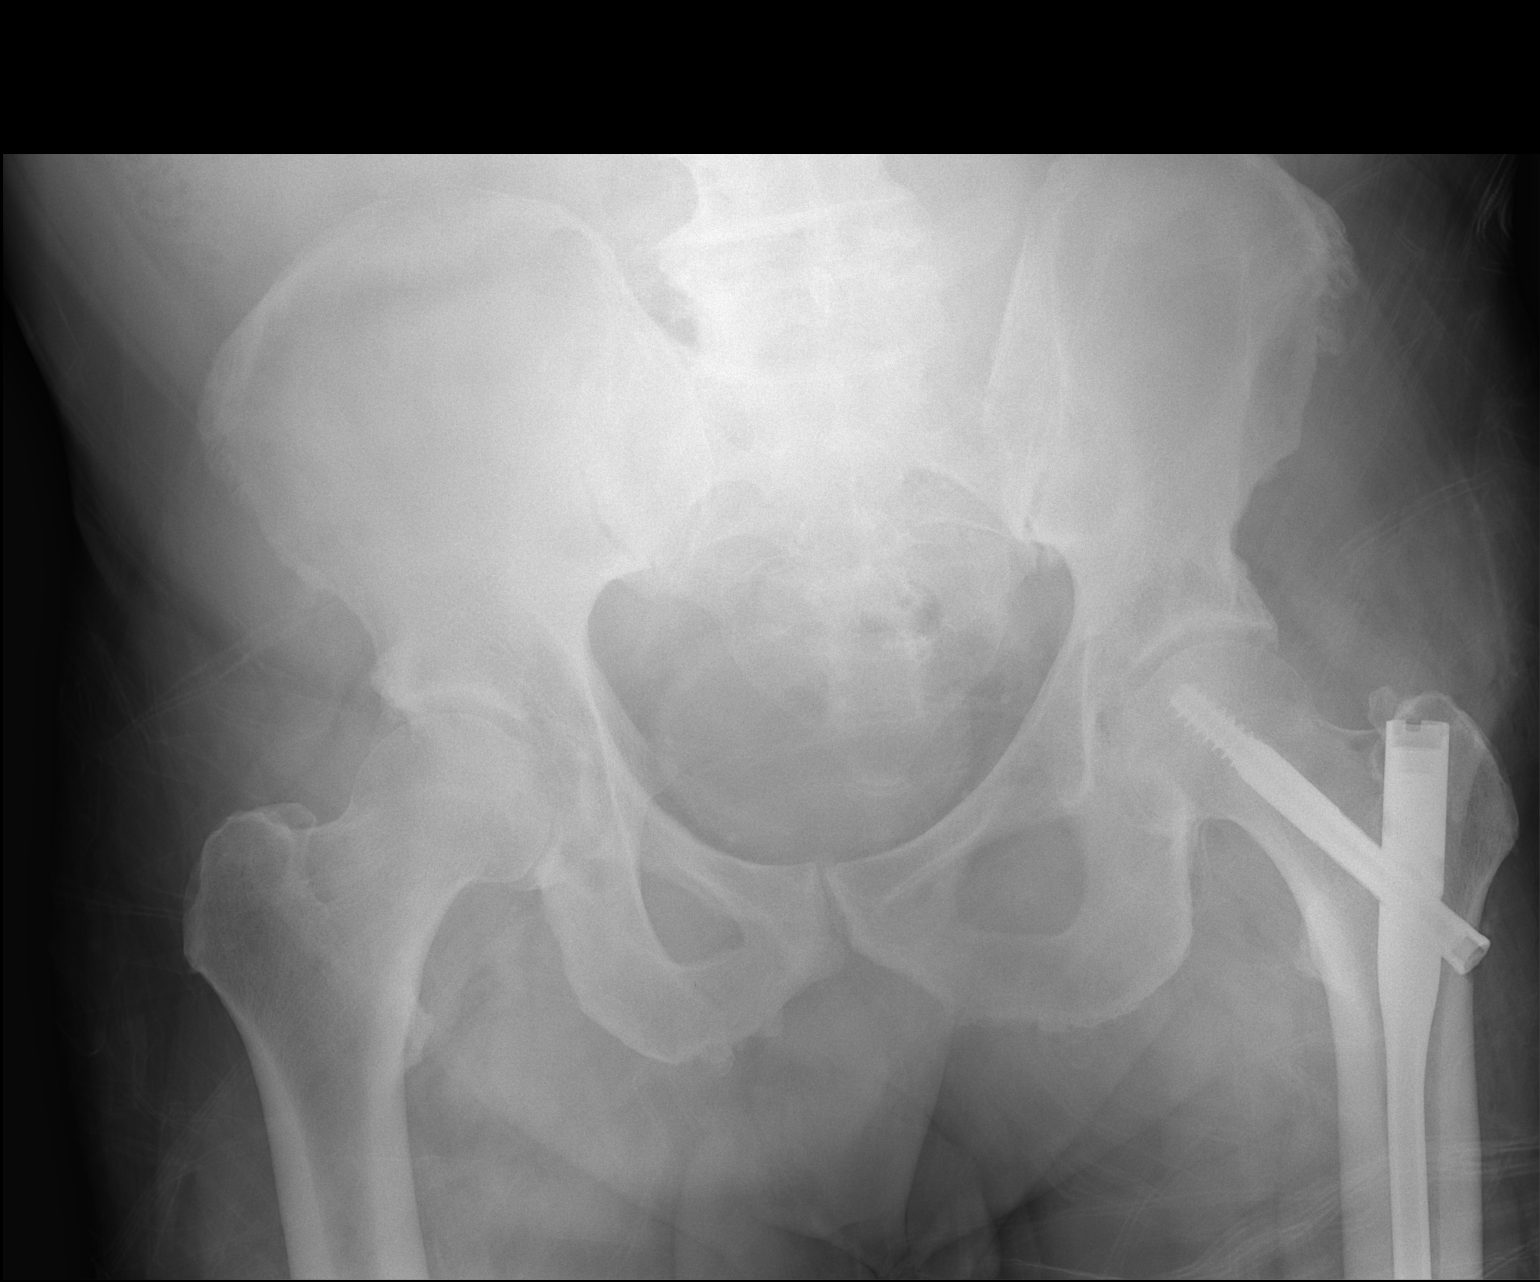

[1 of 1 positions shown; findings below may reference images not displayed]

FINDINGS: Postoperative screw and rod fixation in proximal left femur.
Widespread lytic metastases present, better delineated on recent
PET-CT study. No acute fracture or dislocation. There is mild
symmetric narrowing of each hip joint.
IMPRESSION: Multifocal lytic bone lesions, better delineated on recent PET-CT
examination. Postoperative change proximal left femur. No fracture
or dislocation. Relatively mild symmetric narrowing of each hip
joint noted.

## 2020-07-14 IMAGING — CR DG FEMUR 2+V PORT*L*
4 series · 4 of 4 positions shown · non-contrast
Comparison: Intraoperative left femur images [DATE].
PET-CT [DATE]

CLINICAL DATA: Status post open reduction and internal fixation for
fracture

EXAM:
LEFT FEMUR PORTABLE 2 VIEWS

[AP (1 of 2)]
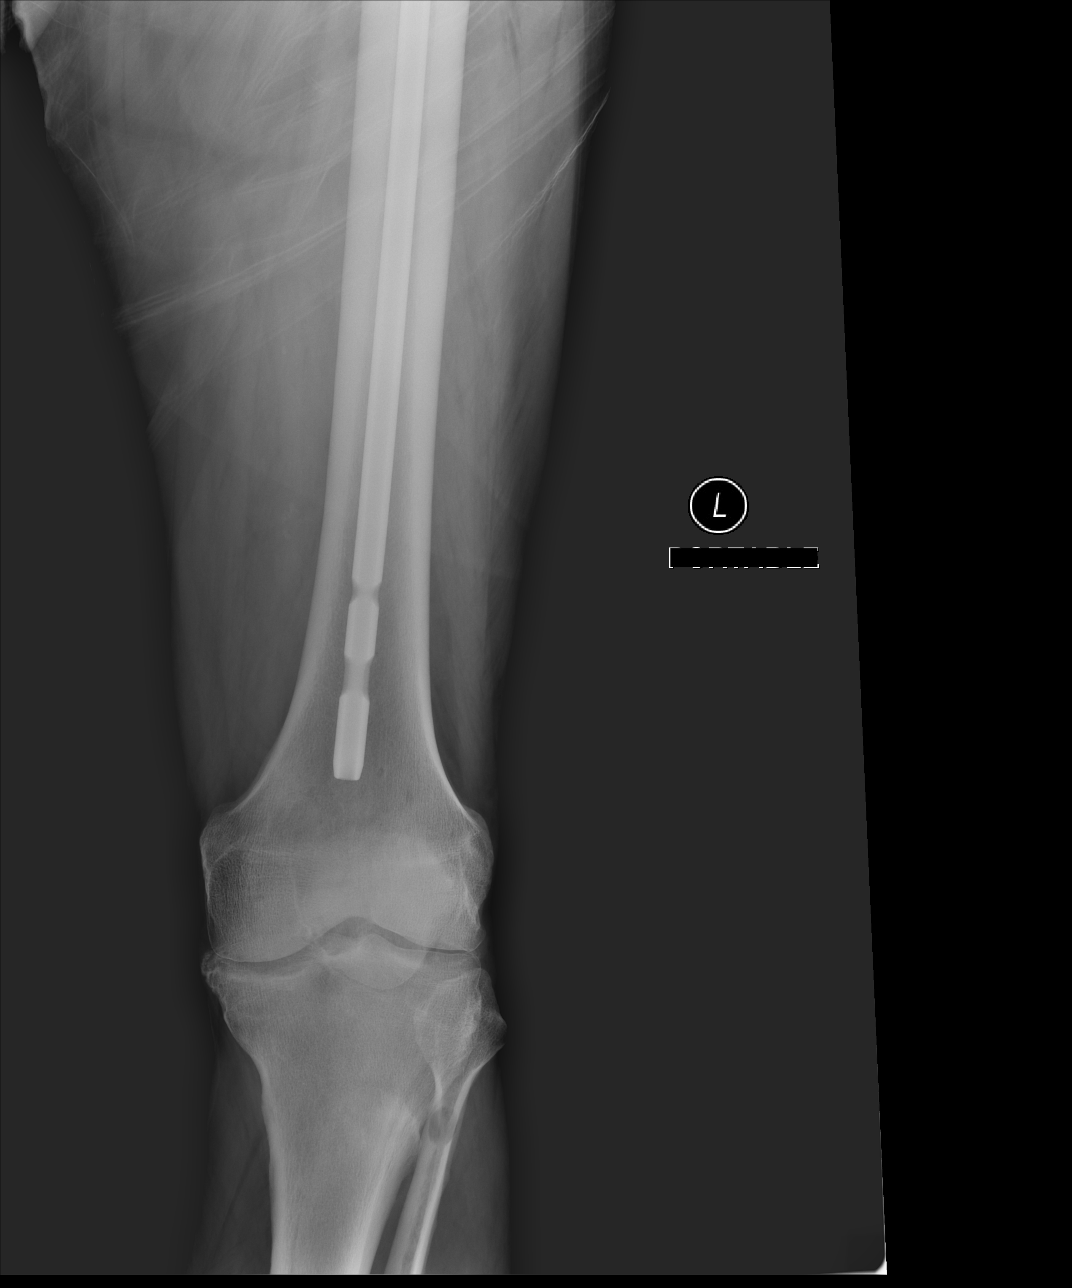

[xtable lateral (1 of 2)]
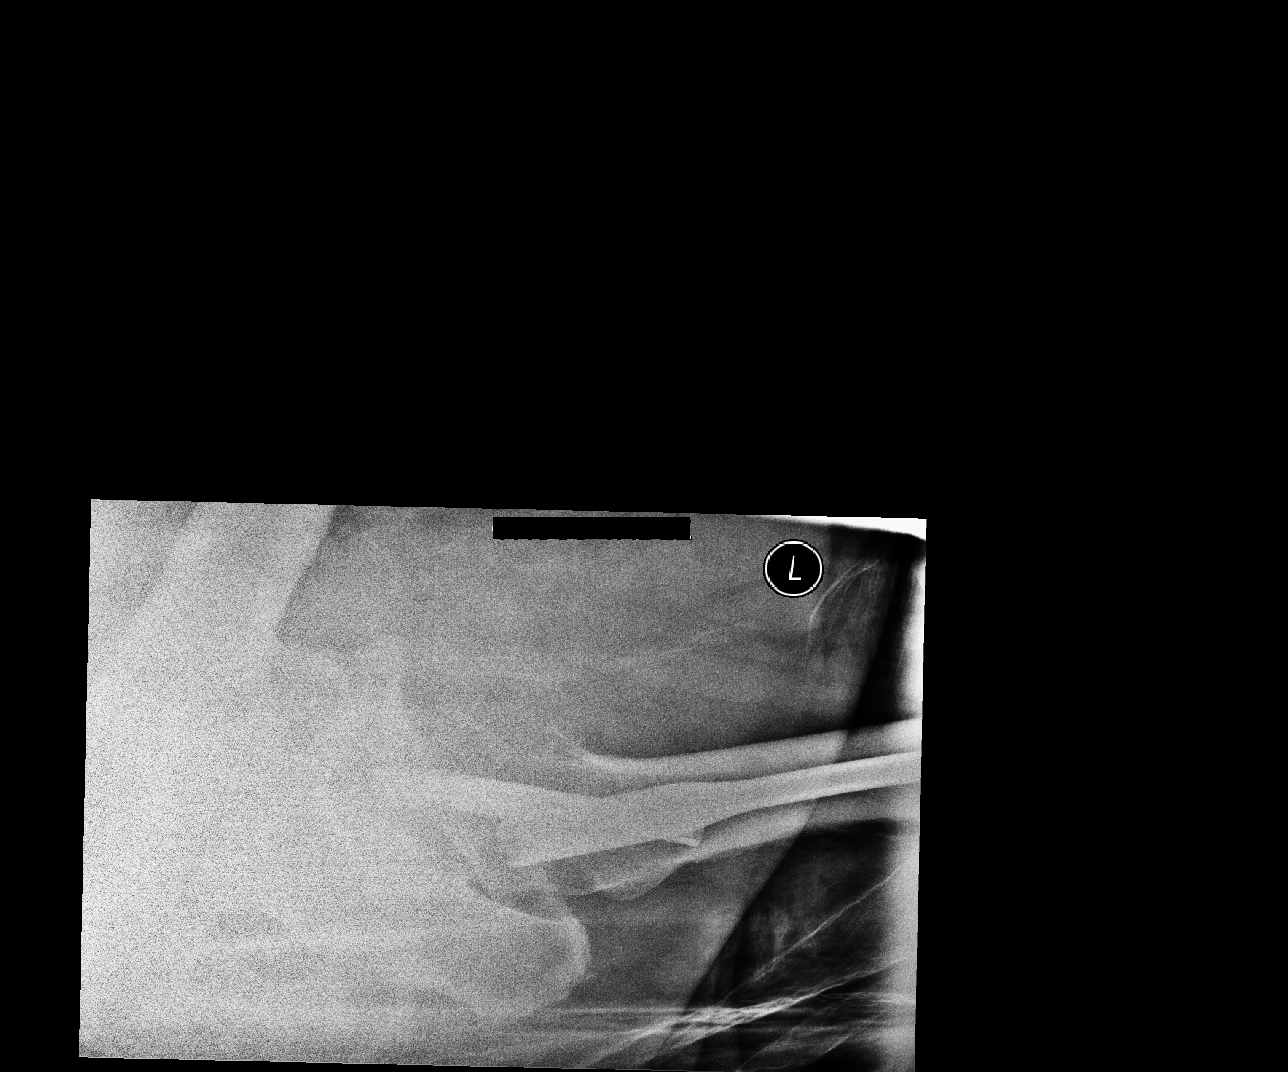

[AP (2 of 2)]
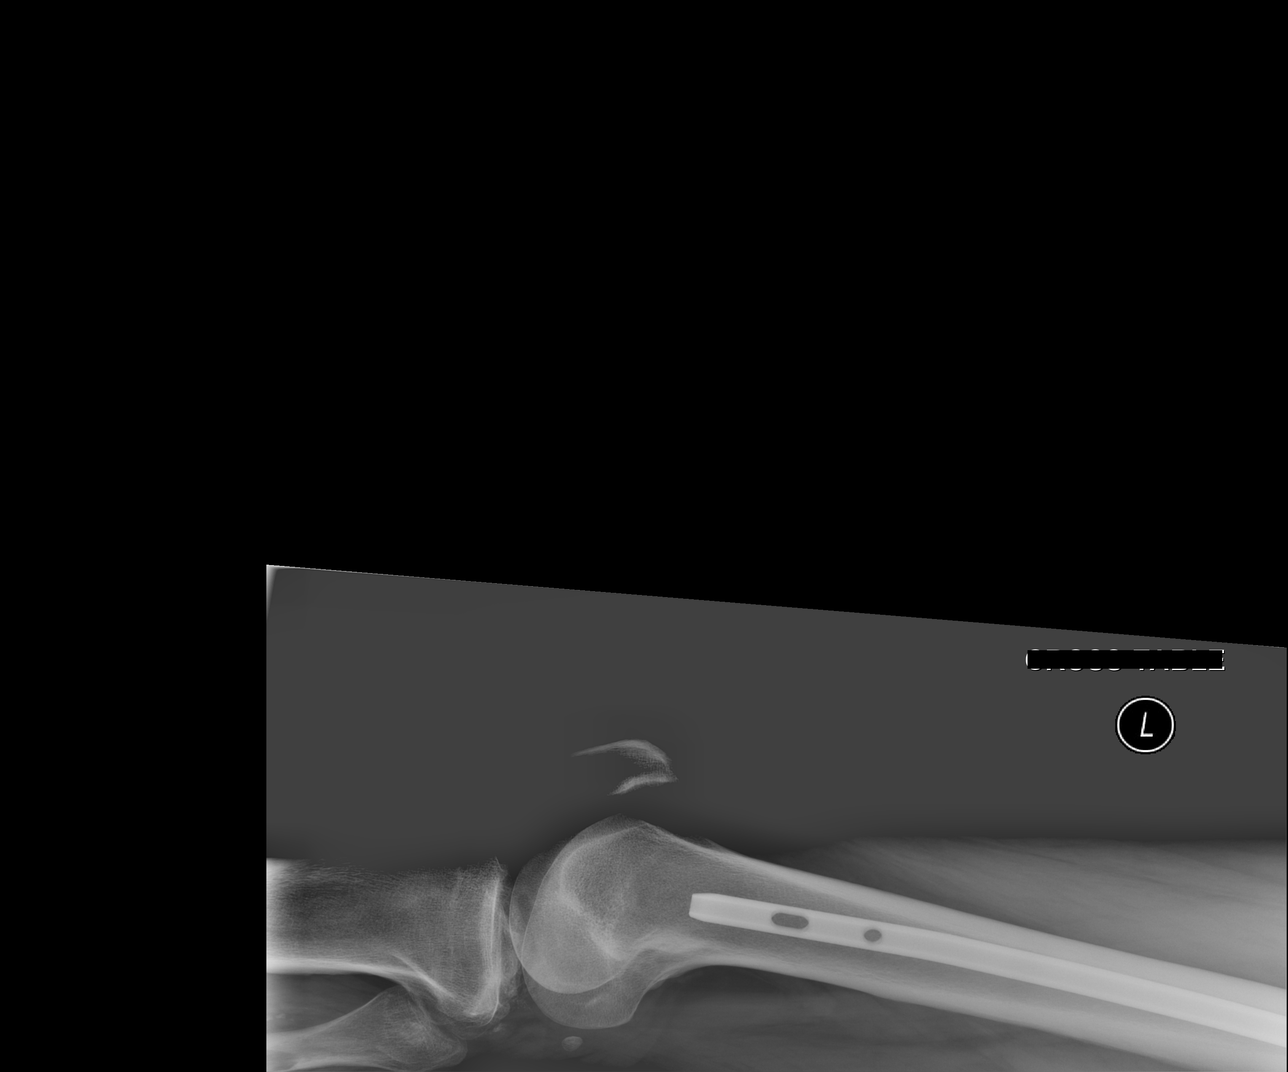

[xtable lateral (2 of 2)]
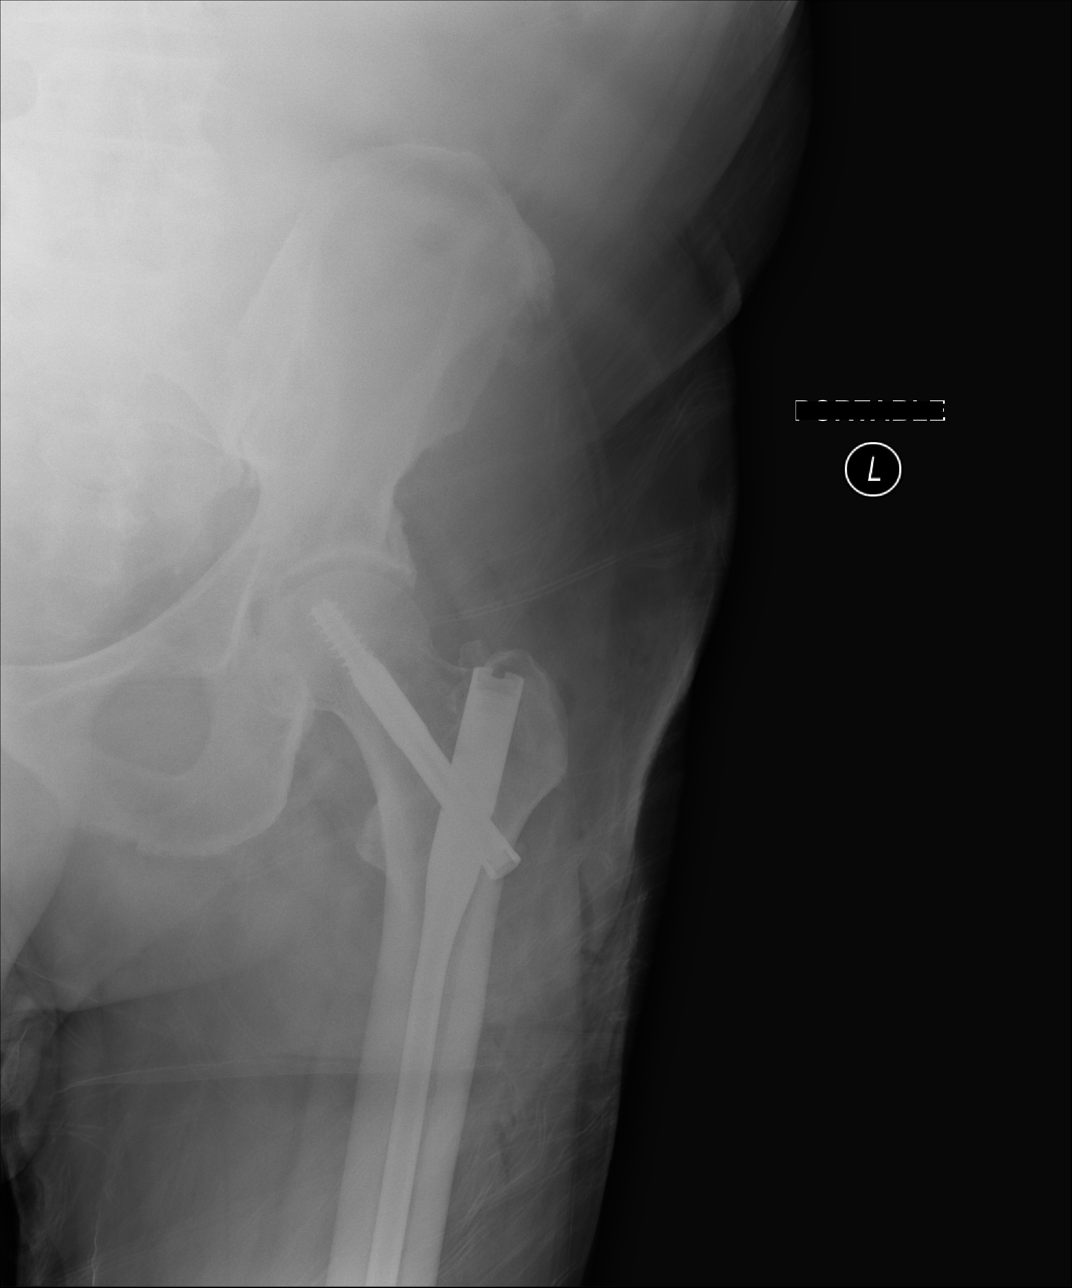

[4 of 4 positions shown; findings below may reference images not displayed]

FINDINGS: Frontal and lateral views obtained. There is screw and rod fixation
through the left femoral shaft. Tip of the screw is in the proximal
left femur. Note that there is fixation through the lytic lesion in
the left lateral femoral neck. Alignment in this area is anatomic.
There is mild narrowing of the left hip joint. Moderate narrowing of
medial and lateral components of left knee. No fracture or
dislocation. There is a lytic lesion in the proximal fibular
diaphysis as well as a lytic lesion in the proximal tibia midline
slightly inferior to the tibial spines.
IMPRESSION: Postoperative change with screw and rod transfixing lytic lesion in
the left femoral neck region. Alignment anatomic. No acute fracture
or dislocation. Mild narrowing left hip joint. Moderate narrowing
left knee joint. Note lytic lesion in proximal fibular diaphysis and
proximal tibia immediately inferior to the tibial spines.

## 2020-07-14 SURGERY — INSERTION, INTRAMEDULLARY ROD, FEMUR
Anesthesia: Monitor Anesthesia Care | Laterality: Left

## 2020-07-14 MED ORDER — CO Q-10 200 MG PO CAPS: 200.0000 mg | ORAL_CAPSULE | Freq: Every day | ORAL | Status: DC

## 2020-07-14 MED ORDER — METHOCARBAMOL 500 MG PO TABS: 500.0000 mg | ORAL_TABLET | Freq: Four times a day (QID) | ORAL | Status: DC | PRN

## 2020-07-14 MED ORDER — RIVAROXABAN 10 MG PO TABS: 10.0000 mg | ORAL_TABLET | Freq: Every day | ORAL | 0 refills | Status: DC

## 2020-07-14 MED ORDER — ALBUMIN HUMAN 5 % IV SOLN: INTRAVENOUS | Status: DC | PRN

## 2020-07-14 MED ORDER — FENTANYL CITRATE (PF) 250 MCG/5ML IJ SOLN
INTRAMUSCULAR | Status: AC
  Filled 2020-07-14: qty 5

## 2020-07-14 MED ORDER — PHENYLEPHRINE 40 MCG/ML (10ML) SYRINGE FOR IV PUSH (FOR BLOOD PRESSURE SUPPORT)
PREFILLED_SYRINGE | INTRAVENOUS | Status: AC
  Filled 2020-07-14: qty 10

## 2020-07-14 MED ORDER — ACETAMINOPHEN 500 MG PO TABS
1000.0000 mg | ORAL_TABLET | Freq: Once | ORAL | Status: AC
  Administered 2020-07-14: 1000 mg via ORAL

## 2020-07-14 MED ORDER — FENTANYL CITRATE (PF) 100 MCG/2ML IJ SOLN
INTRAMUSCULAR | Status: DC
Start: 2020-07-14 — End: 2020-07-14
  Filled 2020-07-14: qty 2

## 2020-07-14 MED ORDER — ONDANSETRON HCL 4 MG PO TABS: 4.0000 mg | ORAL_TABLET | Freq: Four times a day (QID) | ORAL | Status: DC | PRN

## 2020-07-14 MED ORDER — CHLORHEXIDINE GLUCONATE 0.12 % MT SOLN: 15.0000 mL | Freq: Once | OROMUCOSAL | Status: AC

## 2020-07-14 MED ORDER — FENTANYL CITRATE (PF) 100 MCG/2ML IJ SOLN
INTRAMUSCULAR | Status: DC | PRN
Start: 2020-07-14 — End: 2020-07-14
  Administered 2020-07-14: 50 ug via INTRAVENOUS
  Administered 2020-07-14: 100 ug via INTRAVENOUS

## 2020-07-14 MED ORDER — FENTANYL CITRATE (PF) 100 MCG/2ML IJ SOLN
25.0000 ug | INTRAMUSCULAR | Status: DC | PRN
  Administered 2020-07-14 (×2): 25 ug via INTRAVENOUS
  Administered 2020-07-14: 50 ug via INTRAVENOUS

## 2020-07-14 MED ORDER — OXYCODONE HCL 5 MG PO TABS: 5.0000 mg | ORAL_TABLET | Freq: Once | ORAL | Status: DC | PRN

## 2020-07-14 MED ORDER — ACETAMINOPHEN 10 MG/ML IV SOLN: 1000.0000 mg | Freq: Once | INTRAVENOUS | Status: DC | PRN

## 2020-07-14 MED ORDER — CEFAZOLIN SODIUM-DEXTROSE 2-4 GM/100ML-% IV SOLN: 2.0000 g | Freq: Four times a day (QID) | INTRAVENOUS | Status: AC

## 2020-07-14 MED ORDER — ONDANSETRON HCL 4 MG/2ML IJ SOLN: 4.0000 mg | Freq: Four times a day (QID) | INTRAMUSCULAR | Status: DC | PRN

## 2020-07-14 MED ORDER — LIDOCAINE 2% (20 MG/ML) 5 ML SYRINGE
INTRAMUSCULAR | Status: AC
  Filled 2020-07-14: qty 5

## 2020-07-14 MED ORDER — METHOCARBAMOL 1000 MG/10ML IJ SOLN: 500.0000 mg | Freq: Four times a day (QID) | INTRAVENOUS | Status: DC | PRN

## 2020-07-14 MED ORDER — ONDANSETRON HCL 4 MG/2ML IJ SOLN
INTRAMUSCULAR | Status: DC | PRN
  Administered 2020-07-14: 4 mg via INTRAVENOUS

## 2020-07-14 MED ORDER — ALUM & MAG HYDROXIDE-SIMETH 200-200-20 MG/5ML PO SUSP: 30.0000 mL | ORAL | Status: DC | PRN

## 2020-07-14 MED ORDER — DEXAMETHASONE SODIUM PHOSPHATE 10 MG/ML IJ SOLN
INTRAMUSCULAR | Status: AC
  Filled 2020-07-14: qty 1

## 2020-07-14 MED ORDER — 0.9 % SODIUM CHLORIDE (POUR BTL) OPTIME
TOPICAL | Status: DC | PRN
  Administered 2020-07-14: 1000 mL

## 2020-07-14 MED ORDER — ATORVASTATIN CALCIUM 20 MG PO TABS: 20.0000 mg | ORAL_TABLET | Freq: Every day | ORAL | Status: DC

## 2020-07-14 MED ORDER — ACETAMINOPHEN 160 MG/5ML PO SOLN: 1000.0000 mg | Freq: Once | ORAL | Status: DC | PRN

## 2020-07-14 MED ORDER — SUGAMMADEX SODIUM 200 MG/2ML IV SOLN
INTRAVENOUS | Status: DC | PRN
  Administered 2020-07-14: 200 mg via INTRAVENOUS

## 2020-07-14 MED ORDER — PHENYLEPHRINE 40 MCG/ML (10ML) SYRINGE FOR IV PUSH (FOR BLOOD PRESSURE SUPPORT)
PREFILLED_SYRINGE | INTRAVENOUS | Status: DC | PRN
  Administered 2020-07-14: 40 ug via INTRAVENOUS
  Administered 2020-07-14: 120 ug via INTRAVENOUS
  Administered 2020-07-14: 80 ug via INTRAVENOUS

## 2020-07-14 MED ORDER — ROCURONIUM BROMIDE 10 MG/ML (PF) SYRINGE
PREFILLED_SYRINGE | INTRAVENOUS | Status: DC | PRN
  Administered 2020-07-14: 10 mg via INTRAVENOUS
  Administered 2020-07-14: 70 mg via INTRAVENOUS

## 2020-07-14 MED ORDER — PROPOFOL 10 MG/ML IV BOLUS
INTRAVENOUS | Status: AC
  Filled 2020-07-14: qty 20

## 2020-07-14 MED ORDER — LACTATED RINGERS IV SOLN: INTRAVENOUS | Status: DC

## 2020-07-14 MED ORDER — ACETAMINOPHEN 500 MG PO TABS: 1000.0000 mg | ORAL_TABLET | Freq: Once | ORAL | Status: DC | PRN

## 2020-07-14 MED ORDER — CHLORHEXIDINE GLUCONATE 0.12 % MT SOLN
OROMUCOSAL | Status: AC
  Administered 2020-07-14: 15 mL via OROMUCOSAL
  Filled 2020-07-14: qty 15

## 2020-07-14 MED ORDER — SENNA-DOCUSATE SODIUM 8.6-50 MG PO TABS: 2.0000 | ORAL_TABLET | Freq: Every day | ORAL | 1 refills | Status: DC

## 2020-07-14 MED ORDER — LIDOCAINE 2% (20 MG/ML) 5 ML SYRINGE
INTRAMUSCULAR | Status: DC | PRN
  Administered 2020-07-14: 60 mg via INTRAVENOUS

## 2020-07-14 MED ORDER — OXYCODONE HCL 10 MG PO TABS
10.0000 mg | ORAL_TABLET | Freq: Four times a day (QID) | ORAL | Status: DC | PRN
Start: 2020-07-14 — End: 2020-07-14

## 2020-07-14 MED ORDER — FERROUS SULFATE 325 (65 FE) MG PO TABS: 325.0000 mg | ORAL_TABLET | Freq: Three times a day (TID) | ORAL | Status: DC

## 2020-07-14 MED ORDER — BACLOFEN 10 MG PO TABS: 10.0000 mg | ORAL_TABLET | Freq: Three times a day (TID) | ORAL | 0 refills | Status: DC

## 2020-07-14 MED ORDER — CEFAZOLIN SODIUM-DEXTROSE 2-4 GM/100ML-% IV SOLN
2.0000 g | INTRAVENOUS | Status: AC
  Administered 2020-07-14: 2 g via INTRAVENOUS
  Filled 2020-07-14: qty 100

## 2020-07-14 MED ORDER — ROCURONIUM BROMIDE 10 MG/ML (PF) SYRINGE
PREFILLED_SYRINGE | INTRAVENOUS | Status: AC
  Filled 2020-07-14: qty 10

## 2020-07-14 MED ORDER — METOPROLOL SUCCINATE ER 50 MG PO TB24: 50.0000 mg | ORAL_TABLET | Freq: Every day | ORAL | Status: DC

## 2020-07-14 MED ORDER — OXYCODONE HCL 10 MG PO TABS: 10.0000 mg | ORAL_TABLET | Freq: Three times a day (TID) | ORAL | 0 refills | Status: DC | PRN

## 2020-07-14 MED ORDER — LISINOPRIL 5 MG PO TABS: 7.5000 mg | ORAL_TABLET | Freq: Every day | ORAL | Status: DC

## 2020-07-14 MED ORDER — MIDAZOLAM HCL 2 MG/2ML IJ SOLN
INTRAMUSCULAR | Status: AC
  Filled 2020-07-14: qty 2

## 2020-07-14 MED ORDER — PHENOL 1.4 % MT LIQD: 1.0000 | OROMUCOSAL | Status: DC | PRN

## 2020-07-14 MED ORDER — OXYCODONE HCL 5 MG/5ML PO SOLN: 5.0000 mg | Freq: Once | ORAL | Status: DC | PRN

## 2020-07-14 MED ORDER — POTASSIUM CHLORIDE IN NACL 20-0.45 MEQ/L-% IV SOLN: INTRAVENOUS | Status: DC

## 2020-07-14 MED ORDER — DEXAMETHASONE SODIUM PHOSPHATE 10 MG/ML IJ SOLN
INTRAMUSCULAR | Status: DC | PRN
  Administered 2020-07-14: 5 mg via INTRAVENOUS

## 2020-07-14 MED ORDER — SENNA 8.6 MG PO TABS: 1.0000 | ORAL_TABLET | Freq: Two times a day (BID) | ORAL | Status: DC

## 2020-07-14 MED ORDER — ONDANSETRON HCL 4 MG PO TABS: 4.0000 mg | ORAL_TABLET | Freq: Three times a day (TID) | ORAL | 0 refills | Status: DC | PRN

## 2020-07-14 MED ORDER — ONDANSETRON HCL 4 MG/2ML IJ SOLN
INTRAMUSCULAR | Status: AC
  Filled 2020-07-14: qty 2

## 2020-07-14 MED ORDER — DOCUSATE SODIUM 100 MG PO CAPS: 100.0000 mg | ORAL_CAPSULE | Freq: Two times a day (BID) | ORAL | Status: DC

## 2020-07-14 MED ORDER — PROPOFOL 10 MG/ML IV BOLUS
INTRAVENOUS | Status: DC | PRN
  Administered 2020-07-14: 140 mg via INTRAVENOUS

## 2020-07-14 MED ORDER — NITROGLYCERIN 0.4 MG SL SUBL: 0.4000 mg | SUBLINGUAL_TABLET | SUBLINGUAL | Status: DC | PRN

## 2020-07-14 MED ORDER — ACETAMINOPHEN 500 MG PO TABS
ORAL_TABLET | ORAL | Status: AC
  Filled 2020-07-14: qty 2

## 2020-07-14 MED ORDER — MENTHOL 3 MG MT LOZG: 1.0000 | LOZENGE | OROMUCOSAL | Status: DC | PRN

## 2020-07-14 MED ORDER — ORAL CARE MOUTH RINSE: 15.0000 mL | Freq: Once | OROMUCOSAL | Status: AC

## 2020-07-14 SURGICAL SUPPLY — 45 items
BENZOIN TINCTURE PRP APPL 2/3 (GAUZE/BANDAGES/DRESSINGS) ×2 IMPLANT
BIT DRILL LAG SCREW (DRILL) ×1 IMPLANT
BOOTCOVER CLEANROOM LRG (PROTECTIVE WEAR) ×4 IMPLANT
CLSR STERI-STRIP ANTIMIC 1/2X4 (GAUZE/BANDAGES/DRESSINGS) ×2 IMPLANT
COVER PERINEAL POST (MISCELLANEOUS) ×2 IMPLANT
COVER SURGICAL LIGHT HANDLE (MISCELLANEOUS) ×2 IMPLANT
COVER WAND RF STERILE (DRAPES) ×2 IMPLANT
DRAPE STERI IOBAN 125X83 (DRAPES) ×2 IMPLANT
DRILL LAG SCREW (DRILL) ×2
DRSG MEPILEX BORDER 4X4 (GAUZE/BANDAGES/DRESSINGS) ×2 IMPLANT
DRSG MEPILEX BORDER 4X8 (GAUZE/BANDAGES/DRESSINGS) ×2 IMPLANT
DURAPREP 26ML APPLICATOR (WOUND CARE) ×2 IMPLANT
ELECT CAUTERY BLADE 6.4 (BLADE) ×2 IMPLANT
ELECT REM PT RETURN 9FT ADLT (ELECTROSURGICAL) ×2
ELECTRODE REM PT RTRN 9FT ADLT (ELECTROSURGICAL) ×1 IMPLANT
EVACUATOR 1/8 PVC DRAIN (DRAIN) IMPLANT
FACESHIELD WRAPAROUND (MASK) ×4 IMPLANT
GAUZE XEROFORM 5X9 LF (GAUZE/BANDAGES/DRESSINGS) ×2 IMPLANT
GLOVE BIOGEL PI ORTHO PRO SZ8 (GLOVE) ×2
GLOVE ORTHO TXT STRL SZ7.5 (GLOVE) ×4 IMPLANT
GLOVE PI ORTHO PRO STRL SZ8 (GLOVE) ×2 IMPLANT
GLOVE SURG ORTHO 8.0 STRL STRW (GLOVE) ×4 IMPLANT
GOWN STRL REUS W/ TWL XL LVL3 (GOWN DISPOSABLE) ×1 IMPLANT
GOWN STRL REUS W/TWL 2XL LVL3 (GOWN DISPOSABLE) IMPLANT
GOWN STRL REUS W/TWL XL LVL3 (GOWN DISPOSABLE) ×1
GUIDEPIN 3.2X17.5 THRD DISP (PIN) ×4 IMPLANT
GUIDEWIRE BALL NOSE 100CM (WIRE) ×2 IMPLANT
HIP FRAC NAIL LAG SCR 10.5X100 (Orthopedic Implant) ×1 IMPLANT
KIT TURNOVER KIT B (KITS) ×2 IMPLANT
MANIFOLD NEPTUNE II (INSTRUMENTS) ×2 IMPLANT
NAIL HIP FRA AFFIX 130X9X380 L (Nail) ×2 IMPLANT
NS IRRIG 1000ML POUR BTL (IV SOLUTION) ×2 IMPLANT
PACK GENERAL/GYN (CUSTOM PROCEDURE TRAY) ×2 IMPLANT
PAD ARMBOARD 7.5X6 YLW CONV (MISCELLANEOUS) ×4 IMPLANT
SCREW CANN THRD AFF 10.5X100 (Orthopedic Implant) ×1 IMPLANT
STAPLER VISISTAT 35W (STAPLE) ×2 IMPLANT
SUT VIC AB 0 CT1 27 (SUTURE) ×1
SUT VIC AB 0 CT1 27XBRD ANBCTR (SUTURE) ×1 IMPLANT
SUT VIC AB 2-0 FS1 27 (SUTURE) ×2 IMPLANT
SUT VIC AB 2-0 SH 27 (SUTURE)
SUT VIC AB 2-0 SH 27XBRD (SUTURE) IMPLANT
SUT VIC AB 3-0 SH 8-18 (SUTURE) ×2 IMPLANT
TOWEL GREEN STERILE (TOWEL DISPOSABLE) ×2 IMPLANT
TOWEL GREEN STERILE FF (TOWEL DISPOSABLE) ×2 IMPLANT
WATER STERILE IRR 1000ML POUR (IV SOLUTION) ×2 IMPLANT

## 2020-07-14 NOTE — Op Note (Signed)
DATE OF SURGERY:  07/14/2020  TIME: 1:22 PM  PATIENT NAME:  Bruce Smith  AGE: 69 y.o.  PRE-OPERATIVE DIAGNOSIS:  LEFT impending pathologic intertrochanteric hip fracture, proximal femoral lesion  POST-OPERATIVE DIAGNOSIS:  SAME  PROCEDURE: Prophylactic nailing, left intertrochanteric hip fracture  SURGEON:  Johnny Bridge  ASSISTANT:  Merlene Pulling, PA-C, present and scrubbed throughout the case, critical for assistance with exposure, retraction, instrumentation, and closure.  OPERATIVE IMPLANTS: [employer]omet Affixus size 380 x 9 femoral nail with a cephalomedullary lag screw for the femoral head  UNIQUE ASPECTS OF THE CASE: The bone in the proximal femur was extremely sclerotic, very difficult to ream, and in fact I tapped the femoral head using power, and still had significant bony temperature during the preparation.  ESTIMATED BLOOD LOSS: 100 mL  Operative specimens: I sent reamings from the proximal femur to pathology.  This was from the region of the intertrochanteric lesion.  PREOPERATIVE INDICATIONS:  Bruce Smith is a 69 y.o. year old who has recently been diagnosed with an intertrochanteric lesion, and an undetermined primary cancer, thought to be possibly myeloma.  Given the size and location of the lesion, he elected for prophylactic surgical intervention with stabilization of the proximal femur.  The risks benefits and alternatives were discussed with the patient including but not limited to the risks of nonoperative treatment, versus surgical intervention including infection, bleeding, nerve injury, malunion, nonunion, hardware prominence, hardware failure, need for hardware removal, blood clots, cardiopulmonary complications, morbidity, mortality, among others, and they were willing to proceed.    OPERATIVE PROCEDURE:  The patient was brought to the operating room and placed in the supine position. Anesthesia was administered. He was placed on the fracture table.   Time out  was then performed after sterile prep and drape. He received preoperative antibiotics in the form of vancomycin as well as Ancef.  Incision was made proximal to the greater trochanter. A guidewire was placed in the appropriate position. Confirmation was made on AP and lateral views.  I opened the proximal femur with a reamer. I sent the reamings to pathology.  I reamed the femur to a 10.5 and I then placed the nail by hand down.  Once the nail was completely seated, I placed a guidepin into the femoral head into the center center position. I measured the length, and then reamed the lateral cortex and up into the head.  I also tapped, because the bone quality was extremely sclerotic and difficult to prepare.   I then placed the cephalomedullary screw. Slight compression was applied. Anatomic fixation achieved. Bone quality was excellent.  I then secured the proximal interlocking bolt completely.  I took final C-arm pictures AP and lateral. I did not feel that distal fixation was necessary or indicated.    Anatomic stabilization was achieved, and the wounds were irrigated copiously and closed with Vicryl followed by Steri-Strips and sterile gauze for the skin. The patient was awakened and returned to PACU in stable and satisfactory condition. There were no complications and the patient tolerated the procedure well.  He will be weightbearing as tolerated, and will be on xarelto for a period of four weeks after discharge.   Marchia Bond, M.D.

## 2020-07-14 NOTE — H&P (Signed)
PREOPERATIVE H&P  Chief Complaint: Left hip pathologic lesion  HPI: Bruce Smith is a 69 y.o. male who presents for preoperative history and physical with a diagnosis of left intertrochanteric pathologic lesion.  He does have fairly significant low back pain as well as some radicular symptoms, and has a new diagnosis of some type of incompletely defined cancer.  He had a full body PET scan as well as CAT scan that demonstrated a substantial lytic lesion in his left intertrochanteric hip region, and elected for surgical management.  Past Medical History:  Diagnosis Date  . Atherosclerosis   . Coronary artery disease   . Goals of care, counseling/discussion 06/29/2020  . 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 Hyperlipidemia   . Hypertension   . 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 Myocardial infarction [facility])    Past Surgical History:  Procedure Laterality Date  . ANTERIOR CERVICAL DECOMP/DISCECTOMY FUSION N/A 06/17/2020   Procedure: ANTERIOR CERVICAL DECOMPRESSION/DISCECTOMY FUSION, INTERBODY PROSTHESIS, PLATE/SCREWS CERVICAL FIVE- CERVICAL SIX;  Surgeon: Newman Pies, MD;  Location: [facility];  Service: Neurosurgery;  Laterality: N/A;  ANTERIOR CERVICAL DECOMPRESSION/DISCECTOMY FUSION, INTERBODY PROSTHESIS, PLATE/SCREWS CERVICAL FIVE- CERVICAL SIX  . CARDIAC CATHETERIZATION     Social History   Socioeconomic History  . Marital status: Married    Spouse name: Not on file  . Number of children: Not on file  . Years of education: Not on file  . Highest education level: Not on file  Occupational History  . Not on file  Tobacco Use  . Smoking status: Former Smoker    Quit date: 2000    Years since quitting: 21.7  . Smokeless tobacco: Current User    Types: Chew  Vaping Use  . Vaping Use: Never used  Substance and Sexual Activity  . Alcohol use: Not on file    Comment: rare  . Drug use: Never  . Sexual activity: Not on file  Other Topics Concern   . Not on file  Social History Narrative  . Not on file   Social Determinants of Health   Financial Resource Strain:   . Difficulty of Paying Living Expenses: Not on file  Food Insecurity:   . Worried About Charity fundraiser in the Last Year: Not on file  . Ran Out of Food in the Last Year: Not on file  Transportation Needs:   . Lack of Transportation (Medical): Not on file  . Lack of Transportation (Non-Medical): Not on file  Physical Activity:   . Days of Exercise per Week: Not on file  . Minutes of Exercise per Session: Not on file  Stress:   . Feeling of Stress : Not on file  Social Connections:   . Frequency of Communication with Friends and Family: Not on file  . Frequency of Social Gatherings with Friends and Family: Not on file  . Attends Religious Services: Not on file  . Active Member of Clubs or Organizations: Not on file  . Attends Archivist Meetings: Not on file  . Marital Status: Not on file   History reviewed. No pertinent family history. No Known Allergies Prior to Admission medications   Medication Sig Start Date End Date Taking? Authorizing Provider  acetaminophen (TYLENOL) 500 MG tablet Take 1,000 mg by mouth every 6 (six) hours.   Yes [provider]  aspirin EC 81 MG tablet Take 81 mg by mouth daily. Swallow whole.   Yes [provider]  atorvastatin (LIPITOR) 20  MG tablet Take 20 mg by mouth daily.   Yes [provider]  Coenzyme Q10 (CO Q-10) 200 MG CAPS Take 200 mg by mouth daily.   Yes [provider]  cyclobenzaprine (FLEXERIL) 10 MG tablet Take 1 tablet (10 mg total) by mouth 3 (three) times daily as needed for muscle spasms. 06/18/20  Yes Viona Gilmore D, NP  lisinopril (ZESTRIL) 5 MG tablet Take 7.5 mg by mouth daily. 03/10/20  Yes [provider]  metoprolol succinate (TOPROL-XL) 50 MG 24 hr tablet Take 50 mg by mouth daily. 03/10/20  Yes [provider]  nitroGLYCERIN (NITROSTAT)  0.4 MG SL tablet Place 0.4 mg under the tongue every 5 (five) minutes as needed for chest pain.   Yes [provider]  Oxycodone HCl 10 MG TABS Take 10 mg by mouth every 6 (six) hours as needed. 07/02/20  Yes [provider]  Polyethylene Glycol 3350 (MIRALAX PO) Take 2 Scoops by mouth daily.   Yes [provider]  prasugrel (EFFIENT) 10 MG TABS tablet Take 10 mg by mouth daily. 05/12/20  Yes [provider]  pregabalin (LYRICA) 150 MG capsule Take 150 mg by mouth 2 (two) times daily as needed (Nerve pain).  06/02/20  Yes [provider]     Positive ROS: All other systems have been reviewed and were otherwise negative with the exception of those mentioned in the HPI and as above.  Physical Exam: General: Alert, no acute distress Cardiovascular: No pedal edema Respiratory: No cyanosis, no use of accessory musculature GI: No organomegaly, abdomen is soft and non-tender Skin: No lesions in the area of chief complaint Neurologic: Sensation intact distally Psychiatric: Patient is competent for consent with normal mood and affect Lymphatic: No axillary or cervical lymphadenopathy  MUSCULOSKELETAL: Left hip has active motion 0 to 95 degrees, EHL is intact.  Assessment: Left pathologic intertrochanteric hip lesion, impending fracture   Plan: Plan for Procedure(s): Prophylactic internal fixation, left intertrochanteric hip pathologic lesion  The risks benefits and alternatives were discussed with the patient including but not limited to the risks of nonoperative treatment, versus surgical intervention including infection, bleeding, nerve injury,  blood clots, cardiopulmonary complications, morbidity, mortality, progression of cancer, periprosthetic fracture, among others, and they were willing to proceed.    Patient's anticipated LOS is less than 2 midnights, meeting these requirements: - Younger than 14 - Lives within 1 hour of care - Has a  competent adult at home to recover with post-op recover - NO history of  - Chronic pain requiring opiods  - Diabetes  - Coronary Artery Disease  - Heart failure  - Heart attack  - Stroke  - DVT/VTE  - Cardiac arrhythmia  - Respiratory Failure/COPD  - Renal failure  - Anemia  - Advanced Liver disease    Johnny Bridge, MD Cell [phone]   07/14/2020 10:52 AM

## 2020-07-14 NOTE — Transfer of Care (Signed)
Immediate Anesthesia Transfer of Care Note  Patient: Bruce Smith  Procedure(s) Performed: OPEN REDUCTION INTERNAL FIXATION (ORIF LEFT FEMORAL NAIL FRACTURE (Left )  Patient Location: PACU  Anesthesia Type:General  Level of Consciousness: awake and oriented  Airway & Oxygen Therapy: Patient Spontanous Breathing  Post-op Assessment: Report given to RN  Post vital signs: Reviewed and stable  Last Vitals:  Vitals Value Taken Time  BP 110/66 07/14/20 1346  Temp 36.7 C 07/14/20 1345  Pulse 61 07/14/20 1348  Resp 11 07/14/20 1348  SpO2 98 % 07/14/20 1348  Vitals shown include unvalidated device data.  Last Pain:  Vitals:   07/14/20 1122  TempSrc:   PainSc: 0-No pain         Complications: No complications documented.

## 2020-07-14 NOTE — Anesthesia Procedure Notes (Addendum)
Procedure Name: Intubation Date/Time: 07/14/2020 12:04 PM Performed by: Barrington Ellison, CRNA Pre-anesthesia Checklist: Patient identified, Emergency Drugs available, Suction available and Patient being monitored Patient Re-evaluated:Patient Re-evaluated prior to induction Oxygen Delivery Method: Circle System Utilized Preoxygenation: Pre-oxygenation with 100% oxygen Induction Type: IV induction Ventilation: Mask ventilation without difficulty and Oral airway inserted - appropriate to patient size Laryngoscope Size: Glidescope and 4 Grade View: Grade I Tube type: Oral Tube size: 7.5 mm Number of attempts: 1 Airway Equipment and Method: Stylet and Oral airway Placement Confirmation: ETT inserted through vocal cords under direct vision,  positive ETCO2 and breath sounds checked- equal and bilateral Secured at: 22 cm Tube secured with: Tape Dental Injury: Teeth and Oropharynx as per pre-operative assessment  Difficulty Due To: Difficulty was anticipated and Difficult Airway- due to reduced neck mobility Comments: Elective Glidescope use d/t recent cervical surgery and limited neck movement.

## 2020-07-14 NOTE — Discharge Instructions (Signed)
Diet: As you were doing prior to hospitalization   Shower:  May shower but keep the wounds dry, use an occlusive plastic wrap, NO SOAKING IN TUB.  If the bandage gets wet, change with a clean dry gauze.  If you have a splint on, leave the splint in place and keep the splint dry with a plastic bag.  Dressing:  You may change your dressing 3-5 days after surgery, unless you have a splint.  If you have a splint, then just leave the splint in place and we will change your bandages during your first follow-up appointment.    If you had hand or foot surgery, we will plan to remove your stitches in about 2 weeks in the office.  For all other surgeries, there are sticky tapes (steri-strips) on your wounds and all the stitches are absorbable.  Leave the steri-strips in place when changing your dressings, they will peel off with time, usually 2-3 weeks.  Activity:  Increase activity slowly as tolerated, but follow the weight bearing instructions below.  The rules on driving is that you can not be taking narcotics while you drive, and you must feel in control of the vehicle.    Weight Bearing:   Weight bear as tolerated.    To prevent constipation: you may use a stool softener such as -  Colace (over the counter) 100 mg by mouth twice a day  Drink plenty of fluids (prune juice may be helpful) and high fiber foods Miralax (over the counter) for constipation as needed.    Itching:  If you experience itching with your medications, try taking only a single pain pill, or even half a pain pill at a time.  You may take up to 10 pain pills per day, and you can also use benadryl over the counter for itching or also to help with sleep.   Precautions:  If you experience chest pain or shortness of breath - call 911 immediately for transfer to the hospital emergency department!!  If you develop a fever greater that 101 F, purulent drainage from wound, increased redness or drainage from wound, or calf pain -- Call the  office at [phone]                                                Follow- Up Appointment:  Please call for an appointment to be seen in 2 weeks [place] - [phone]

## 2020-07-15 ENCOUNTER — Encounter: Payer: Self-pay | Admitting: *Deleted

## 2020-07-15 ENCOUNTER — Encounter (HOSPITAL_COMMUNITY): Payer: Self-pay | Admitting: Orthopedic Surgery

## 2020-07-15 LAB — SURGICAL PATHOLOGY

## 2020-07-15 NOTE — Progress Notes (Signed)
Pathology back confirming MM. Dr [name] requests that patient come in for appointment early next week for discussion regarding next steps and treatment.   Called and spoke to patient and wife. Pathology results given and appointment information relayed. Answered some basic questions about MM to their satisfaction.   Patient already has new patient appointment with Dr Sondra Come on 07/27/20. Dr [name] would like this patient seen sooner. Message sent to Radiation scheduling requesting the appointment be move up. The will reschedule the appointment to next week.  Oncology Nurse Navigator Documentation  Oncology Nurse Navigator Flowsheets 07/15/2020  Abnormal Finding Date -  Confirmed Diagnosis Date 07/09/2020  Diagnosis Status Additional Work Up  Navigator Follow Up Date: 07/21/2020  Navigator Follow Up Reason: Follow-up After Biopsy  Navigator Location [facility]  Navigator Encounter Type Pathology Review;Introductory Phone Call  Patient Visit Type MedOnc  Treatment Phase Pre-Tx/Tx Discussion  Barriers/Navigation Needs Coordination of Care;Education  Education Newly Diagnosed Cancer Education  Interventions Coordination of Care;Education;Psycho-Social Support  Acuity Level 2-Minimal Needs (1-2 Barriers Identified)  Coordination of Care Appts  Education Method Verbal  Support Groups/Services Friends and Family  Time Spent with Patient 56

## 2020-07-17 ENCOUNTER — Telehealth: Payer: Self-pay

## 2020-07-17 LAB — SURGICAL PATHOLOGY

## 2020-07-17 NOTE — Progress Notes (Signed)
Patient here for a consult with Dr. Sondra Come.  Referral MD  Reason for Referral: Multiple bony lesions with soft tissue mass associated with left scapula and left fourth rib  Chief Complaint  Patient presents with  . New Patient (Initial Visit)  : I was told that I fractured my shoulder blade on my left side.  HPI: Mr. Westerhold is a very nice 69 year old white male.  He comes in with his wife.  He has been in good health.  He had a myocardial infarction back in 2000.  He had 3 stents placed.  He has been doing well since then.  Of note, he recently, and I mean in the month of August, had surgery for his cervical spine.  He will underwent a C5-6 ACDF.  This was actually done on 06/17/2020.  He had no problems with surgery.  He is healing up.  He had surgery because he was having a lot of pain in the upper back and shoulders.  He fell a couple days ago.  I am unsure if he lost his balance or if he slipped.  He began to have a lot of pain in the left shoulder, particular shoulder blade.  He had no fever.  He had no bleeding.  He had no cough or shortness of breath.  He had no nausea or vomiting.  Of note, he has not had a colonoscopy.  He went to the emergency room.  He had an CT scan done.  Shockingly, this showed a lytic expansile lesion within the left scapula.  There is a fracture through the left scapula.  He had a expansile lytic lesion within the left lateral fourth rib.  He had a soft tissue mass measuring 6.4 x 4 cm.  Had a lucent lesion within T1.  Multiple small lytic lesions throughout the remainder of the bones including the right scapula, bilateral ribs and T11.  There is nothing in the mediastinum with adenopathy.  His lungs looked okay.  There is nothing in the liver.  He had no adenopathy in the abdomen or pelvis.  He had lab work done while he was in the ER.  His LDH was 171.  His white cell count was 13.4.  Hemoglobin 14.9.  Platelet count 289,000.  His sodium was 137.   Potassium 4.4.  BUN 19 creatinine 1.19.  Calcium was 11.1 with an albumin of 3.7.  His liver function studies were okay.  His total protein was 6.8.  He had tumor markers done.  His CEA was normal at 0.9.  PSA was 3.7.  He was then kindly referred to the [facility] for an evaluation.  Again he looks like he is in great shape.  He works for the city of [employer].  He has no obvious occupational exposures.  There is no history of cancer in the family.  There is a history of heart disease.  He has had nothing removed from his skin.  He has had no swollen lymph nodes.  He has had no weight loss or weight gain.  Of note, he has smoked.  He stopped about 21 years ago.  I would say probably has a 30-pack-year history of tobacco use.  Currently, I would say his performance status is ECOG 1.         Past Medical History:  Diagnosis Date  . Atherosclerosis   . Hyperlipidemia   . Hypertension   . Myocardial infarction [facility])        :  Past Surgical History:  Procedure Laterality Date  . ANTERIOR CERVICAL DECOMP/DISCECTOMY FUSION N/A 06/17/2020   Procedure: ANTERIOR CERVICAL DECOMPRESSION/DISCECTOMY FUSION, INTERBODY PROSTHESIS, PLATE/SCREWS CERVICAL FIVE- CERVICAL SIX;  Surgeon: Newman Pies, MD;  Location: [facility];  Service: Neurosurgery;  Laterality: N/A;  ANTERIOR CERVICAL DECOMPRESSION/DISCECTOMY FUSION, INTERBODY PROSTHESIS, PLATE/SCREWS CERVICAL FIVE- CERVICAL SIX  . CARDIAC CATHETERIZATION        Recent Labs (last 2 labs)       '@IPVITALS' @   Recent Labs    06/29/20 1453  WBC 13.6*  HGB 13.8  HCT 41.0  PLT 270           Recent Labs    06/29/20 1453  NA 137  K 4.5  CL 99  CO2 31  GLUCOSE 124*  BUN 22  CREATININE 1.61*  CALCIUM 12.8*    Blood smear review: None  Pathology: None    Assessment and Plan: Mr. Maraj is a 69 year old white male.  He has lytic bone lesions.  Has a soft tissue mass associated with the  left scapula and left fourth rib.  I would think that this would be most likely lung cancer despite the fact that there is nothing seen in the lungs on his CT scan.  Myeloma would be another possibility.  His total protein is not high.  His albumin is not low.  Primary bone lymphoma is also a possibility.  Melanoma would also be a consideration given that he has had fair skin.  He has not had any moles removed.  I would not think that this is a primary bone malignancy.  I would also not think this would be a GI primary given that the CEA is okay.  A nonsecretory prostate cancer is always possible.  We will go to have to get a biopsy.  We will have to get core biopsies of this soft tissue mass associated with the left scapula and left fourth rib.  He sees orthopedic surgery tomorrow.  I do not see that this is a process that is going to be surgically correctable.  Once we confirm malignancy, we will have to get radiation oncology involved.  They live in [place] and would like to go to [facility] for radiation therapy.  We also will need to get a PET scan on him.  I spent over an hour with he and his wife.  They are both very very nice.  The daughter was on the cell phone in her car.  I went over all my recommendations.  I answered all of their questions.  I explained to them that this is likely stage IV cancer and this is treatable but not curable.  I really cannot give them a prognosis since we do not know the origin of this malignancy.  Once we have all the studies back, we will get him back in.  I gave him a prayer blanket and he was very thankful for this.       Electronically signed by Volanda Napoleon, MD at 06/29/2020 5:22 PM  Office Visit on 06/29/2020   07/21/2020  Principle Diagnosis:   Kappa light chain myeloma-extensive bone disease-pathologic fracture of left scapula and left femur  Current Therapy:         Status post surgical repair of  left femur-07/14/2020  XRT to left shoulder and left hip  Xgeva 120 mg subcu every 3 months-next dose in November 2021  Faspro/Kyprolis/Revlimid/Decadron -- start cycle #1 on 08/06/2020  Interim History:  Mr. Dombkowski is back for follow-up.  We now have a diagnosis for him.  He actually underwent a biopsy of the scapular lesion.  This was done on 07/15/2020.  The pathology report ([id number]) shows plasma cell neoplasm consistent with myeloma.  The tumor was kappa restricted.  He also had repair of a pathologic fracture of the left hip.  This was on 07/14/2020.  The pathology report ([id number]) showed multiple myeloma.  Again this is kappa restricted.  He had a PET scan done which showed incredible uptake in lot of his bones.  He had lab work done we will refer saw him.  There is actually no monoclonal spike in his blood.  However, he did have a high level of Kappa light chain in his blood that was 16 mg/dL.  Dr. [name]   Past/Anticipated interventions by medical oncology, if any: no, wife states patient will have IV therapy but no chemo, subqu and PO.  Signs/Symptoms  Weight changes, if any: Lost 12 lbs in 3 weeks  Respiratory complaints, if any: no  Hemoptysis, if any: no  Pain issues, if any:  Back and left hip  SAFETY ISSUES:  Prior radiation? no  Pacemaker/ICD? no  Possible current pregnancy? n/a  Is the patient on methotrexate? no  Current Complaints / other details:  Patient accompanied by his wife, Maudie Mercury. Patient reports severe nausea and just started Ativan yesterday and is not helping.  BP (!) 141/64 (BP Location: Left Arm, Patient Position: Sitting)   Pulse 72   Temp 98.2 F (36.8 C) (Oral)   Resp 18   Ht '5\' 8"'  (1.727 m)   Wt 209 lb 8 oz (95 kg)   SpO2 97%   BMI 31.85 kg/m   Wt Readings from Last 3 Encounters:  07/22/20 209 lb 8 oz (95 kg)  07/21/20 214 lb (97.1 kg)  07/14/20 217 lb (98.4 kg)

## 2020-07-17 NOTE — Telephone Encounter (Signed)
Received FMLA paperwork via fax. Placed in inbox of Otilio Carpen, Development worker, community. dph

## 2020-07-20 ENCOUNTER — Encounter (HOSPITAL_COMMUNITY): Payer: Self-pay | Admitting: Orthopedic Surgery

## 2020-07-20 ENCOUNTER — Other Ambulatory Visit: Payer: Self-pay | Admitting: *Deleted

## 2020-07-20 DIAGNOSIS — C419 Malignant neoplasm of bone and articular cartilage, unspecified: Secondary | ICD-10-CM

## 2020-07-20 NOTE — Anesthesia Postprocedure Evaluation (Signed)
Anesthesia Post Note  Patient: Bruce Smith  Procedure(s) Performed: OPEN REDUCTION INTERNAL FIXATION (ORIF LEFT FEMORAL NAIL FRACTURE (Left )     Patient location during evaluation: PACU Anesthesia Type: General Level of consciousness: patient cooperative and awake Pain management: pain level controlled Vital Signs Assessment: post-procedure vital signs reviewed and stable Respiratory status: spontaneous breathing, nonlabored ventilation, respiratory function stable and patient connected to nasal cannula oxygen Cardiovascular status: blood pressure returned to baseline and stable Postop Assessment: no apparent nausea or vomiting Anesthetic complications: no   No complications documented.  Last Vitals:  Vitals:   07/14/20 1445 07/14/20 1500  BP: (!) 130/59 129/67  Pulse: 65 82  Resp: 20 19  Temp: 36.4 C   SpO2: 94% 98%    Last Pain:  Vitals:   07/14/20 1445  TempSrc:   PainSc: 2                  Akyra Bouchie

## 2020-07-21 ENCOUNTER — Other Ambulatory Visit: Payer: Self-pay

## 2020-07-21 ENCOUNTER — Encounter: Payer: Self-pay | Admitting: *Deleted

## 2020-07-21 ENCOUNTER — Inpatient Hospital Stay: Payer: BC Managed Care – PPO

## 2020-07-21 ENCOUNTER — Encounter: Payer: Self-pay | Admitting: Hematology & Oncology

## 2020-07-21 ENCOUNTER — Other Ambulatory Visit: Payer: Self-pay | Admitting: Hematology & Oncology

## 2020-07-21 ENCOUNTER — Inpatient Hospital Stay: Payer: BC Managed Care – PPO | Attending: Hematology & Oncology | Admitting: Hematology & Oncology

## 2020-07-21 DIAGNOSIS — R350 Frequency of micturition: Secondary | ICD-10-CM | POA: Diagnosis not present

## 2020-07-21 DIAGNOSIS — Z7901 Long term (current) use of anticoagulants: Secondary | ICD-10-CM | POA: Diagnosis not present

## 2020-07-21 DIAGNOSIS — C9000 Multiple myeloma not having achieved remission: Secondary | ICD-10-CM

## 2020-07-21 DIAGNOSIS — Z79899 Other long term (current) drug therapy: Secondary | ICD-10-CM | POA: Diagnosis not present

## 2020-07-21 DIAGNOSIS — C419 Malignant neoplasm of bone and articular cartilage, unspecified: Secondary | ICD-10-CM

## 2020-07-21 DIAGNOSIS — Z7982 Long term (current) use of aspirin: Secondary | ICD-10-CM | POA: Diagnosis not present

## 2020-07-21 DIAGNOSIS — M899 Disorder of bone, unspecified: Secondary | ICD-10-CM | POA: Insufficient documentation

## 2020-07-21 DIAGNOSIS — R110 Nausea: Secondary | ICD-10-CM | POA: Insufficient documentation

## 2020-07-21 HISTORY — DX: Multiple myeloma not having achieved remission: C90.00

## 2020-07-21 LAB — CBC WITH DIFFERENTIAL (CANCER CENTER ONLY)
Abs Immature Granulocytes: 0.12 10*3/uL — ABNORMAL HIGH (ref 0.00–0.07)
Basophils Absolute: 0 10*3/uL (ref 0.0–0.1)
Basophils Relative: 0 %
Eosinophils Absolute: 0.1 10*3/uL (ref 0.0–0.5)
Eosinophils Relative: 1 %
HCT: 38.7 % — ABNORMAL LOW (ref 39.0–52.0)
Hemoglobin: 12.8 g/dL — ABNORMAL LOW (ref 13.0–17.0)
Immature Granulocytes: 1 %
Lymphocytes Relative: 27 %
Lymphs Abs: 2.9 10*3/uL (ref 0.7–4.0)
MCH: 29.1 pg (ref 26.0–34.0)
MCHC: 33.1 g/dL (ref 30.0–36.0)
MCV: 88 fL (ref 80.0–100.0)
Monocytes Absolute: 1.3 10*3/uL — ABNORMAL HIGH (ref 0.1–1.0)
Monocytes Relative: 12 %
Neutro Abs: 6.3 10*3/uL (ref 1.7–7.7)
Neutrophils Relative %: 59 %
Platelet Count: 363 10*3/uL (ref 150–400)
RBC: 4.4 MIL/uL (ref 4.22–5.81)
RDW: 13.2 % (ref 11.5–15.5)
WBC Count: 10.7 10*3/uL — ABNORMAL HIGH (ref 4.0–10.5)
nRBC: 0 % (ref 0.0–0.2)

## 2020-07-21 LAB — URINALYSIS, COMPLETE (UACMP) WITH MICROSCOPIC
Bilirubin Urine: NEGATIVE
Glucose, UA: NEGATIVE mg/dL
Hgb urine dipstick: NEGATIVE
Ketones, ur: NEGATIVE mg/dL
Leukocytes,Ua: NEGATIVE
Nitrite: NEGATIVE
Protein, ur: NEGATIVE mg/dL
Specific Gravity, Urine: >1.03 — ABNORMAL HIGH (ref 1.005–1.030)
pH: 5.5 (ref 5.0–8.0)

## 2020-07-21 LAB — COMPREHENSIVE METABOLIC PANEL
ALT: 35 U/L (ref 0–44)
AST: 19 U/L (ref 15–41)
Albumin: 3.9 g/dL (ref 3.5–5.0)
Alkaline Phosphatase: 155 U/L — ABNORMAL HIGH (ref 38–126)
Anion gap: 9 (ref 5–15)
BUN: 12 mg/dL (ref 8–23)
CO2: 26 mmol/L (ref 22–32)
Calcium: 9 mg/dL (ref 8.9–10.3)
Chloride: 102 mmol/L (ref 98–111)
Creatinine, Ser: 1.04 mg/dL (ref 0.61–1.24)
GFR calc Af Amer: >60 mL/min (ref >60)
GFR calc non Af Amer: >60 mL/min (ref >60)
Glucose, Bld: 107 mg/dL — ABNORMAL HIGH (ref 70–99)
Potassium: 4.3 mmol/L (ref 3.5–5.1)
Sodium: 137 mmol/L (ref 135–145)
Total Bilirubin: 0.7 mg/dL (ref 0.3–1.2)
Total Protein: 6.5 g/dL (ref 6.5–8.1)

## 2020-07-21 LAB — LACTATE DEHYDROGENASE: LDH: 158 U/L (ref 98–192)

## 2020-07-21 MED ORDER — MORPHINE SULFATE ER 15 MG PO TBCR: 15.0000 mg | EXTENDED_RELEASE_TABLET | Freq: Two times a day (BID) | ORAL | 0 refills | Status: DC

## 2020-07-21 MED ORDER — LORAZEPAM 0.5 MG PO TABS: 0.5000 mg | ORAL_TABLET | Freq: Four times a day (QID) | ORAL | 0 refills | Status: DC | PRN

## 2020-07-21 MED ORDER — MORPHINE SULFATE 15 MG PO TABS: 15.0000 mg | ORAL_TABLET | Freq: Four times a day (QID) | ORAL | 0 refills | Status: DC | PRN

## 2020-07-21 NOTE — Progress Notes (Signed)
Hematology and Oncology Follow Up Visit  Bruce Smith [id number] 1951-10-07 69 y.o. 07/21/2020   Principle Diagnosis:   Kappa light chain myeloma-extensive bone disease-pathologic fracture of left scapula and left femur  Current Therapy:    Status post surgical repair of left femur-07/14/2020  XRT to left shoulder and left hip  Xgeva 120 mg subcu every 3 months-next dose in November 2021  Faspro/Kyprolis/Revlimid/Decadron -- start cycle #1 on 08/06/2020     Interim History:  [name] is back for follow-up.  We now have a diagnosis for him.  He actually underwent a biopsy of the scapular lesion.  This was done on 07/15/2020.  The pathology report ([id number]) shows plasma cell neoplasm consistent with myeloma.  The tumor was kappa restricted.  He also had repair of a pathologic fracture of the left hip.  This was on 07/14/2020.  The pathology report ([id number]) showed multiple myeloma.  Again this is kappa restricted.  He had a PET scan done which showed incredible uptake in lot of his bones.  He had lab work done we will refer saw him.  There is actually no monoclonal spike in his blood.  However, he did have a high level of Kappa light chain in his blood that was 16 mg/dL.  He will see radiation oncology tomorrow.  We will refer saw him, he had hypercalcemia.  I did give him a dose of Xgeva at that time.  He has not yet had a bone marrow biopsy done.  He will need to have this done.  The cytogenetics and FISH panel will be incredibly important.  He also needs to have a 24-hour urine done so we see how much light chain he is excreting.  We certainly are quite fortunate that we have a problem that we can treat and give him a long life for the most part.  Ultimately, he will need to have a autologous stem cell transplant.  I think the real issue is when we start treatment.  Again he needs radiation therapy for the left hip and left shoulder.  Hopefully this can get started  soon and he can have a shortened course of treatment.  He really looks fantastic.  Has a rolling walker.  Is not wearing a shoulder harness.  He is having a lot of nausea.  Says he has a hard time eating because of nausea.  I do not know if this might be from the pain medication that he is taking.  He got oxycodone after his surgery.  I will switch this out.  I will try him on some MSIR and also try some MS Contin on him.  Maybe this will be more amenable and hopefully his nausea will get better.  I will also send in some lorazepam (0.5 mg sublingual every 6 hours as needed) to help with nausea.  He has had no bleeding.  There is no cough or shortness of breath.  He is having some urinary frequency.  We did check a urinalysis on him.  This really was unremarkable.  Currently, his performance status is ECOG 1.  Medications:  Current Outpatient Medications:  .  aspirin 81 MG EC tablet, Take 81 mg by mouth daily. Swallow whole., Disp: , Rfl:  .  Polyethylene Glycol 3350 (MIRALAX PO), Take by mouth as needed., Disp: , Rfl:  .  acetaminophen (TYLENOL) 500 MG tablet, Take 1,000 mg by mouth every 6 (six) hours., Disp: , Rfl:  .  atorvastatin (LIPITOR) 20 MG tablet,  Take 20 mg by mouth daily., Disp: , Rfl:  .  baclofen (LIORESAL) 10 MG tablet, Take 1 tablet (10 mg total) by mouth 3 (three) times daily. As needed for muscle spasm, Disp: 50 tablet, Rfl: 0 .  Coenzyme Q10 (CO Q-10) 200 MG CAPS, Take 200 mg by mouth daily., Disp: , Rfl:  .  cyclobenzaprine (FLEXERIL) 10 MG tablet, Take 1 tablet (10 mg total) by mouth 3 (three) times daily as needed for muscle spasms., Disp: 30 tablet, Rfl: 0 .  lisinopril (ZESTRIL) 5 MG tablet, Take 7.5 mg by mouth daily., Disp: , Rfl:  .  LORazepam (ATIVAN) 0.5 MG tablet, Place 1 tablet (0.5 mg total) under the tongue every 6 (six) hours as needed for anxiety., Disp: 30 tablet, Rfl: 0 .  metoprolol succinate (TOPROL-XL) 50 MG 24 hr tablet, Take 50 mg by mouth daily.,  Disp: , Rfl:  .  morphine (MS CONTIN) 15 MG 12 hr tablet, Take 1 tablet (15 mg total) by mouth every 12 (twelve) hours., Disp: 60 tablet, Rfl: 0 .  morphine (MSIR) 15 MG tablet, Take 1 tablet (15 mg total) by mouth every 6 (six) hours as needed for severe pain., Disp: 90 tablet, Rfl: 0 .  nitroGLYCERIN (NITROSTAT) 0.4 MG SL tablet, Place 0.4 mg under the tongue every 5 (five) minutes as needed for chest pain., Disp: , Rfl:  .  ondansetron (ZOFRAN) 4 MG tablet, Take 1 tablet (4 mg total) by mouth every 8 (eight) hours as needed for nausea or vomiting., Disp: 10 tablet, Rfl: 0 .  Polyethylene Glycol 3350 (MIRALAX PO), Take 2 Scoops by mouth daily., Disp: , Rfl:  .  prasugrel (EFFIENT) 10 MG TABS tablet, Take 10 mg by mouth daily., Disp: , Rfl:  .  pregabalin (LYRICA) 150 MG capsule, Take 150 mg by mouth 2 (two) times daily as needed (Nerve pain). , Disp: , Rfl:  .  rivaroxaban (XARELTO) 10 MG TABS tablet, Take 1 tablet (10 mg total) by mouth daily., Disp: 30 tablet, Rfl: 0 .  sennosides-docusate sodium (SENOKOT-S) 8.6-50 MG tablet, Take 2 tablets by mouth daily., Disp: 30 tablet, Rfl: 1  Allergies: No Known Allergies  Past Medical History, Surgical history, Social history, and Family History were reviewed and updated.  Review of Systems: Review of Systems  Constitutional: Negative.   HENT:  Negative.   Eyes: Negative.   Respiratory: Negative.   Cardiovascular: Negative.   Gastrointestinal: Negative.   Endocrine: Negative.   Genitourinary: Negative.    Musculoskeletal: Positive for arthralgias.  Skin: Negative.   Neurological: Negative.   Hematological: Negative.   Psychiatric/Behavioral: Negative.     Physical Exam:  weight is 214 lb (97.1 kg).   Wt Readings from Last 3 Encounters:  07/21/20 214 lb (97.1 kg)  07/14/20 217 lb (98.4 kg)  06/29/20 226 lb (102.5 kg)    Physical Exam Vitals reviewed.  HENT:     Head: Normocephalic and atraumatic.  Eyes:     Pupils: Pupils are  equal, round, and reactive to light.  Cardiovascular:     Rate and Rhythm: Normal rate and regular rhythm.     Heart sounds: Normal heart sounds.  Pulmonary:     Effort: Pulmonary effort is normal.     Breath sounds: Normal breath sounds.  Abdominal:     General: Bowel sounds are normal.     Palpations: Abdomen is soft.  Musculoskeletal:        General: No tenderness or deformity. Normal range of  motion.     Cervical back: Normal range of motion.     Comments: He has the left hip surgical scar that is healed.  He has some decreased range of motion of the left hip area.  He has some discomfort in the left shoulder blade.  He does seem to have little bit of better range of motion in this area.  Lymphadenopathy:     Cervical: No cervical adenopathy.  Skin:    General: Skin is warm and dry.     Findings: No erythema or rash.  Neurological:     Mental Status: He is alert and oriented to person, place, and time.  Psychiatric:        Behavior: Behavior normal.        Thought Content: Thought content normal.        Judgment: Judgment normal.    Lab Results  Component Value Date   WBC 10.7 (H) 07/21/2020   HGB 12.8 (L) 07/21/2020   HCT 38.7 (L) 07/21/2020   MCV 88.0 07/21/2020   PLT 363 07/21/2020     Chemistry      Component Value Date/Time   NA 137 07/21/2020 1256   K 4.3 07/21/2020 1256   CL 102 07/21/2020 1256   CO2 26 07/21/2020 1256   BUN 12 07/21/2020 1256   CREATININE 1.04 07/21/2020 1256   CREATININE 1.61 (H) 06/29/2020 1453      Component Value Date/Time   CALCIUM 9.0 07/21/2020 1256   ALKPHOS 155 (H) 07/21/2020 1256   AST 19 07/21/2020 1256   AST 17 06/29/2020 1453   ALT 35 07/21/2020 1256   ALT 30 06/29/2020 1453   BILITOT 0.7 07/21/2020 1256   BILITOT 0.7 06/29/2020 1453      Impression and Plan: Mr. Derner is a very nice 69 year old white male.  He has extensive myeloma at least over the bones.  I do suspect that this is going to be a kappa light chain  myeloma.  He has not really no anemia.  He has no renal insufficiency.  He did have some hypercalcemia.  We will see what the 24-hour urine shows.  It would not surprise me if he has quite a bit of Kappa light chain in the urine.  We will also need to have a bone marrow biopsy done.  The important part of the biopsy will definitely have to be the cytogenetics and the [facility] panel.  Ultimately, he will need to have his stem cell transplant.  He is healthy enough to undergo this.  As far as his chemotherapy is concerned, I probably would use a 4 regimen protocol.  I would use daratumumab/Kyprolis/Revlimid/dexamethasone.  I think we can get a response rate over 90% with this protocol.  I think would be well-tolerated.  I do not think it would really be too harmful for her stem cells.  He will need radiation therapy first.  I spoke with Dr. Sondra Come of Radiation Oncology.  He will try to do the radiation as quickly as possible.  He says it probably will take 2 weeks.  We will need to have a Port-A-Cath placed.  I will have to set everything up once I know when radiation will finish.  I spent about an hour with Mr. Roesler and his wife.  His daughter was on the cell phone.  I answered all their questions.  We certainly have a lot of optimism given that this appears to be myeloma.  I will plan to see  him back when we start his chemotherapy likely in October.   Volanda Napoleon, MD 9/21/20214:35 PM

## 2020-07-21 NOTE — Progress Notes (Signed)
Initial RN Navigator Patient Visit  Name: Mosie Angus  Diagnosis: MM This is patient's second office visit. This navigator out of the office for his first appointment.   Met with patient prior to their visit with MD. Hanley Seamen patient "Your Patient Navigator" handout which explains my role, areas in which I am able to help, and all the contact information for myself and the office. Also gave patient MD and Navigator business card. Reviewed with patient the general overview of expected course after initial diagnosis and time frame for all steps to be completed.  New patient packet given to patient which includes: orientation to office and staff; campus directory; education on My Chart and Advance Directives; and patient centered education on Multiple Myeloma.  Patient completed visit with Dr. [name].   Patient need a bone marrow biopsy, nutrition consult, chemo education and initiation of treatment. Will begin scheduling once patient is seen by radiation tomorrow and treatment plan scheduled.   Patient understands all follow up procedures and expectations. They have my number to reach out for any further clarification or additional needs.

## 2020-07-21 NOTE — Progress Notes (Signed)
START ON PATHWAY REGIMEN - Multiple Myeloma and Other Plasma Cell Dyscrasias   DaraVRd (Daratumumab IV + Bortezomib SUBQ + Lenalidomide PO + Dexamethasone IV/PO) q21 Days (Induction Schema):   A cycle is every 21 days:     Lenalidomide      Dexamethasone      Bortezomib      Daratumumab   **Always confirm dose/schedule in your pharmacy ordering system**  DaraVRd (Daratumumab IV + Bortezomib SUBQ + Lenalidomide PO + Dexamethasone IV/PO) q21 Days (Consolidation Schema):   A cycle is every 21 days:     Lenalidomide      Dexamethasone      Bortezomib      Daratumumab   **Always confirm dose/schedule in your pharmacy ordering system**  Patient Characteristics: Multiple Myeloma, Newly Diagnosed, Transplant Eligible, High Risk Disease Classification: Multiple Myeloma R-ISS Staging: III Therapeutic Status: Newly Diagnosed Is Patient Eligible for Transplant<= Transplant Eligible Risk Status: High Risk Intent of Therapy: Curative Intent, Discussed with Patient

## 2020-07-22 ENCOUNTER — Ambulatory Visit
Admission: RE | Admit: 2020-07-22 | Discharge: 2020-07-22 | Disposition: A | Discharge status: Home / Self Care | Payer: BC Managed Care – PPO | Source: Ambulatory Visit | Attending: Radiation Oncology | Admitting: Radiation Oncology

## 2020-07-22 ENCOUNTER — Telehealth: Payer: Self-pay | Admitting: Hematology & Oncology

## 2020-07-22 ENCOUNTER — Encounter: Payer: Self-pay | Admitting: Radiation Oncology

## 2020-07-22 VITALS — BP 141/64 | HR 72 | Temp 98.2°F | Resp 18 | Ht 68.0 in | Wt 209.5 lb

## 2020-07-22 DIAGNOSIS — I252 Old myocardial infarction: Secondary | ICD-10-CM | POA: Insufficient documentation

## 2020-07-22 DIAGNOSIS — I2510 Atherosclerotic heart disease of native coronary artery without angina pectoris: Secondary | ICD-10-CM | POA: Diagnosis not present

## 2020-07-22 DIAGNOSIS — Z7901 Long term (current) use of anticoagulants: Secondary | ICD-10-CM | POA: Diagnosis not present

## 2020-07-22 DIAGNOSIS — C9000 Multiple myeloma not having achieved remission: Secondary | ICD-10-CM | POA: Diagnosis present

## 2020-07-22 DIAGNOSIS — E785 Hyperlipidemia, unspecified: Secondary | ICD-10-CM | POA: Diagnosis not present

## 2020-07-22 DIAGNOSIS — I10 Essential (primary) hypertension: Secondary | ICD-10-CM | POA: Insufficient documentation

## 2020-07-22 DIAGNOSIS — M50323 Other cervical disc degeneration at C6-C7 level: Secondary | ICD-10-CM | POA: Insufficient documentation

## 2020-07-22 DIAGNOSIS — Z79899 Other long term (current) drug therapy: Secondary | ICD-10-CM | POA: Insufficient documentation

## 2020-07-22 DIAGNOSIS — I700 Atherosclerosis of aorta: Secondary | ICD-10-CM | POA: Diagnosis not present

## 2020-07-22 DIAGNOSIS — C419 Malignant neoplasm of bone and articular cartilage, unspecified: Secondary | ICD-10-CM

## 2020-07-22 DIAGNOSIS — Z87891 Personal history of nicotine dependence: Secondary | ICD-10-CM | POA: Insufficient documentation

## 2020-07-22 DIAGNOSIS — Z7982 Long term (current) use of aspirin: Secondary | ICD-10-CM | POA: Insufficient documentation

## 2020-07-22 DIAGNOSIS — N400 Enlarged prostate without lower urinary tract symptoms: Secondary | ICD-10-CM | POA: Diagnosis not present

## 2020-07-22 LAB — PROTEIN ELECTROPHORESIS, SERUM, WITH REFLEX
A/G Ratio: 1.3 (ref 0.7–1.7)
Albumin ELP: 3.5 g/dL (ref 2.9–4.4)
Alpha-1-Globulin: 0.3 g/dL (ref 0.0–0.4)
Alpha-2-Globulin: 1 g/dL (ref 0.4–1.0)
Beta Globulin: 0.9 g/dL (ref 0.7–1.3)
Gamma Globulin: 0.5 g/dL (ref 0.4–1.8)
Globulin, Total: 2.7 g/dL (ref 2.2–3.9)
Total Protein ELP: 6.2 g/dL (ref 6.0–8.5)

## 2020-07-22 LAB — KAPPA/LAMBDA LIGHT CHAINS
Kappa free light chain: 179.9 mg/L — ABNORMAL HIGH (ref 3.3–19.4)
Kappa, lambda light chain ratio: 36.71 — ABNORMAL HIGH (ref 0.26–1.65)
Lambda free light chains: 4.9 mg/L — ABNORMAL LOW (ref 5.7–26.3)

## 2020-07-22 LAB — IGG, IGA, IGM
IgA: 138 mg/dL (ref 61–437)
IgG (Immunoglobin G), Serum: 543 mg/dL — ABNORMAL LOW (ref 603–1613)
IgM (Immunoglobulin M), Srm: 8 mg/dL — ABNORMAL LOW (ref 20–172)

## 2020-07-22 MED ORDER — ONDANSETRON HCL 8 MG PO TABS: 8.0000 mg | ORAL_TABLET | Freq: Three times a day (TID) | ORAL | 1 refills | Status: DC | PRN

## 2020-07-22 NOTE — Progress Notes (Signed)
Radiation Oncology         ([phone]) [phone] ________________________________  Initial Outpatient Consultation  Name: Bruce Smith MRN: [id number]  Date: 07/22/2020  DOB: [birth year]  TI:[id number], Quin Hoop., MD  Derrill Center., MD   REFERRING PHYSICIAN: Derrill Center., MD  DIAGNOSIS: The primary encounter diagnosis was Multiple myeloma not having achieved remission ([place]). 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   Kappa light chain myeloma with extensive bone disease and pathologic fracture of the left scapula and left femur  HISTORY OF PRESENT ILLNESS::Bruce Smith is a 69 y.o. male who is seen as a courtesy of Dr. [name] for an opinion concerning radiation therapy as part of management for his recently diagnosed multiple myeloma. Today, he is accompanied by wife. The patient presented to the ED on 06/26/2020 with chief complaint of left shoulder pain s/p fall secondary to dizziness. X-ray of left shoulder at that time showed a comminuted fracture of scapula in addition to multiple lucent lesions within the proximal left humerus, consistent with metastatic disease. It also showed erosive changes of the fourth left rib laterally with underlying soft tissue mass that was also consistent with metastatic disease. CT of head was also performed at that time and showed osteolytic lesions in the left parietal bone and right parietal bone, concerning for metastatic disease or multiple myeloma. CT scan of cervical spine showed post-op ACDF of C5-6 (performed on 06/17/2020 by Dr. Arnoldo Morale), anterior plate and screws position anteriorly due to prominent anterior osteophyte. No hematoma or residual spinal stenosis. Additionally, it showed cervical spondylosis with mild spinal stenosis at C4-5 and C6-7, mild foraminal stenosis bilaterally at C6-7, a 14 x 18 mm lytic lesion at the T1 vertebral body (also noted on prior MRI), and a second small 5 x 7 mm lytic  lesion in the right lamina of T1 that was concerning for neoplasm; possible myeloma vs metastatic disease. Finally, a CT scan of chest/abdomen/pelvis was completed and showed numerous diffuse low-density lesions throughout the bony structures of the chest, abdomen, and pelvis including bilateral scapula, multiple ribs, thoracic spine, lumbar spine, and bony pelvis. Findings were concerning for metastasis or myeloma.   Given the above findings, the patient was referred to Dr. [name] and was seen in consultation on 06/29/2020. At that time, it was recommended that he proceed with a core biopsies of the soft tissue mass associated with the left scapula and left fourth rib in addition to a PET scan.  PET scan on 07/07/2020 showed multifocal lytic bone lesions, all levels of thoracic and lumbar spine showed involvement, lytic changes in the left scapula with destruction and pathologic fracture, and destruction of left-side ribs with dominant chest wall lesion. Additionally, there was multifocal involvement of the bilateral humeri and bilateral femora, most notably in the left proximal femur where there was a large destructive lesion at risk for pathologic fracture involving nearly the entire left femoral neck. Finally, there was a small right upper lobe pulmonary nodule that measured 7 x 4 mm and was non-specific. Imaging characteristics were most suggestive of either myeloma or metastatic disease. Of primary neoplasms that could present in that fashion, melanoma was the leading consideration. There were no signs of adenopathy or splenic enlargement.  The patient underwent a CT-guided biopsy of the left scapular lytic mass on 07/09/2020 that was performed by Darrell Allred, PA-C. Pathology from the procedure revealed plasma cell neoplasm, most likely plasma cell myeloma given the  multiple lytic lesions (kappa restricted).  The patient then underwent a prophylactic nailing of left intertrochanteric hip fracture  on 07/14/2020 that was performed by Dr. Mardelle Matte. Pathology from the procedure revealed plasma cell neoplasm of the left femur (kappa restricted).   The patient was last seen by Dr. [name] yesterday, 07/21/2020. At that time, it was recommended that he proceed with adjuvant radiation therapy to the left shoulder and left hip. Additionally, he was given a dose of Xgeva a few days prior and is expected to get his next dose in November of 2021. Finally, he is to begin chemotherapy with Faspro, Kyprolis, Revlimid, and Decadron on 08/06/2020.  PREVIOUS RADIATION THERAPY: No  PAST MEDICAL HISTORY:  Past Medical History:  Diagnosis Date  . Atherosclerosis   . Coronary artery disease   . Goals of care, counseling/discussion 06/29/2020  . 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 Hyperlipidemia   . Hypertension   . 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 Multiple myeloma ([place]) 07/21/2020  . Myocardial infarction [facility])     PAST SURGICAL HISTORY: Past Surgical History:  Procedure Laterality Date  . ANTERIOR CERVICAL DECOMP/DISCECTOMY FUSION N/A 06/17/2020   Procedure: ANTERIOR CERVICAL DECOMPRESSION/DISCECTOMY FUSION, INTERBODY PROSTHESIS, PLATE/SCREWS CERVICAL FIVE- CERVICAL SIX;  Surgeon: Newman Pies, MD;  Location: [facility];  Service: Neurosurgery;  Laterality: N/A;  ANTERIOR CERVICAL DECOMPRESSION/DISCECTOMY FUSION, INTERBODY PROSTHESIS, PLATE/SCREWS CERVICAL FIVE- CERVICAL SIX  . CARDIAC CATHETERIZATION    . FEMUR IM NAIL Left 07/14/2020   Procedure: OPEN REDUCTION INTERNAL FIXATION (ORIF LEFT FEMORAL NAIL FRACTURE;  Surgeon: Marchia Bond, MD;  Location: [facility];  Service: Orthopedics;  Laterality: Left;    FAMILY HISTORY: History reviewed. No pertinent family history.  SOCIAL HISTORY:  Social History   Tobacco Use  . Smoking status: Former Smoker    Quit date: 2000    Years since quitting: 21.7  . Smokeless tobacco: Current User    Types: Chew  Vaping Use    . Vaping Use: Never used  Substance Use Topics  . Alcohol use: Not on file    Comment: rare  . Drug use: Never    ALLERGIES: No Known Allergies  MEDICATIONS:  Current Outpatient Medications  Medication Sig Dispense Refill  . acetaminophen (TYLENOL) 500 MG tablet Take 1,000 mg by mouth every 6 (six) hours.    Marland Kitchen aspirin 81 MG EC tablet Take 81 mg by mouth daily. Swallow whole.    Marland Kitchen atorvastatin (LIPITOR) 20 MG tablet Take 20 mg by mouth daily.    . baclofen (LIORESAL) 10 MG tablet Take 1 tablet (10 mg total) by mouth 3 (three) times daily. As needed for muscle spasm 50 tablet 0  . Coenzyme Q10 (CO Q-10) 200 MG CAPS Take 200 mg by mouth daily.    . cyclobenzaprine (FLEXERIL) 10 MG tablet Take 1 tablet (10 mg total) by mouth 3 (three) times daily as needed for muscle spasms. 30 tablet 0  . lisinopril (ZESTRIL) 5 MG tablet Take 7.5 mg by mouth daily.    Marland Kitchen LORazepam (ATIVAN) 0.5 MG tablet Take 1 tablet (0.5 mg total) by mouth every 6 (six) hours as needed for anxiety (nausea). 40 tablet 0  . metoprolol succinate (TOPROL-XL) 50 MG 24 hr tablet Take 50 mg by mouth daily.    Marland Kitchen morphine (MS CONTIN) 15 MG 12 hr tablet Take 1 tablet (15 mg total) by mouth every 12 (twelve) hours. 60 tablet 0  . morphine (MSIR) 15 MG tablet Take 1 tablet (  15 mg total) by mouth every 6 (six) hours as needed for severe pain. 90 tablet 0  . nitroGLYCERIN (NITROSTAT) 0.4 MG SL tablet Place 0.4 mg under the tongue every 5 (five) minutes as needed for chest pain.    Marland Kitchen ondansetron (ZOFRAN) 8 MG tablet Take 1 tablet (8 mg total) by mouth every 8 (eight) hours as needed for nausea or vomiting. 20 tablet 1  . Polyethylene Glycol 3350 (MIRALAX PO) Take 2 Scoops by mouth daily.    . Polyethylene Glycol 3350 (MIRALAX PO) Take by mouth as needed.    . prasugrel (EFFIENT) 10 MG TABS tablet Take 10 mg by mouth daily.    . pregabalin (LYRICA) 150 MG capsule Take 150 mg by mouth 2 (two) times daily as needed (Nerve pain).     .  rivaroxaban (XARELTO) 10 MG TABS tablet Take 1 tablet (10 mg total) by mouth daily. 30 tablet 0  . sennosides-docusate sodium (SENOKOT-S) 8.6-50 MG tablet Take 2 tablets by mouth daily. 30 tablet 1   No current facility-administered medications for this encounter.    REVIEW OF SYSTEMS:  A 10+ POINT REVIEW OF SYSTEMS WAS OBTAINED including neurology, dermatology, psychiatry, cardiac, respiratory, lymph, extremities, GI, GU, musculoskeletal, constitutional, reproductive, HEENT.  Patient complains of pain along the left chest wall and scapular region consistent with his extensive disease in this area.  He also complains of pain in the lower back sacrum area.  He has postoperative soreness in the left femur.   PHYSICAL EXAM:  height is '5\' 8"'  (1.727 m) and weight is 209 lb 8 oz (95 kg). His oral temperature is 98.2 F (36.8 C). His blood pressure is 141/64 (abnormal) and his pulse is 72. His respiration is 18 and oxygen saturation is 97%.   General: Alert and oriented, in no acute distress, uses a walker for ambulation HEENT: Head is normocephalic. Extraocular movements are intact.  Neck: Neck is supple, no palpable cervical or supraclavicular lymphadenopathy. Heart: Regular in rate and rhythm with no murmurs, rubs, or gallops. Chest: Clear to auscultation bilaterally, with no rhonchi, wheezes, or rales. Abdomen: Soft, nontender, nondistended, with no rigidity or guarding. Extremities: No cyanosis or edema. Lymphatics: see Neck Exam Skin: No concerning lesions. Musculoskeletal: symmetric strength and muscle tone throughout. Neurologic: Cranial nerves II through XII are grossly intact. No obvious focalities. Speech is fluent. Coordination is intact. Psychiatric: Judgment and insight are intact. Affect is appropriate.   ECOG = 2  0 - Asymptomatic (Fully active, able to carry on all predisease activities without restriction)  1 - Symptomatic but completely ambulatory (Restricted in physically  strenuous activity but ambulatory and able to carry out work of a light or sedentary nature. For example, light housework, office work)  2 - Symptomatic, <50% in bed during the day (Ambulatory and capable of all self care but unable to carry out any work activities. Up and about more than 50% of waking hours)  3 - Symptomatic, >50% in bed, but not bedbound (Capable of only limited self-care, confined to bed or chair 50% or more of waking hours)  4 - Bedbound (Completely disabled. Cannot carry on any self-care. Totally confined to bed or chair)  5 - Death   Eustace Pen MM, Creech RH, Tormey DC, et al. [phone]). "Toxicity and response criteria of the [facility]". [place] Oncol. 5 (6): 649-55  LABORATORY DATA:  Lab Results  Component Value Date   WBC 10.7 (H) 07/21/2020   HGB 12.8 (L)  07/21/2020   HCT 38.7 (L) 07/21/2020   MCV 88.0 07/21/2020   PLT 363 07/21/2020   NEUTROABS 6.3 07/21/2020   Lab Results  Component Value Date   NA 137 07/21/2020   K 4.3 07/21/2020   CL 102 07/21/2020   CO2 26 07/21/2020   GLUCOSE 107 (H) 07/21/2020   CREATININE 1.04 07/21/2020   CALCIUM 9.0 07/21/2020      RADIOGRAPHY: CT Head Wo Contrast  Result Date: 06/26/2020 CLINICAL DATA:  Dizzy and fell into a door frame. Recent cervical spine surgery. EXAM: CT HEAD WITHOUT CONTRAST TECHNIQUE: Contiguous axial images were obtained from the base of the skull through the vertex without intravenous contrast. COMPARISON:  None. FINDINGS: Brain: No evidence of acute infarction, hemorrhage, hydrocephalus, extra-axial collection or mass lesion/mass effect. Vascular: Calcified atherosclerosis at the skullbase. No hyperdense vessel. Skull: Negative for fracture. Osteolytic 1.3 x 1.8 x 1.1 cm low-density lesion in the left parietal bone involving the inner and outer tables (series 4, image 47). Additional smaller osteolytic lesions left parietal bone (series 3, image 39), and right parietal bone  (series 3, image 62). Sinuses/Orbits: No acute finding. Other: None. IMPRESSION: 1. No acute intracranial abnormality. 2. Osteolytic lesions in the left parietal bone and right parietal bone, concerning for metastatic disease or multiple myeloma. Electronically Signed   By: Titus Dubin M.D.   On: 06/26/2020 16:36   CT Angio Chest PE W/Cm &/Or Wo Cm  Result Date: 06/26/2020 CLINICAL DATA:  Dizziness. Recent cervical spine surgery. Fall, scapular fracture. EXAM: CT ANGIOGRAPHY CHEST CT ABDOMEN AND PELVIS WITH CONTRAST TECHNIQUE: Multidetector CT imaging of the chest was performed using the standard protocol during bolus administration of intravenous contrast. Multiplanar CT image reconstructions and MIPs were obtained to evaluate the vascular anatomy. Multidetector CT imaging of the abdomen and pelvis was performed using the standard protocol during bolus administration of intravenous contrast. CONTRAST:  168m OMNIPAQUE IOHEXOL 350 MG/ML SOLN COMPARISON:  Left shoulder series earlier today. FINDINGS: CTA CHEST FINDINGS Cardiovascular: Is no filling defects in the pulmonary arteries to suggest pulmonary emboli. Coronary artery calcifications diffusely. Scattered aortic calcifications. No aneurysm. Heart is normal size. Mediastinum/Nodes: No mediastinal, hilar, or axillary adenopathy. Trachea and esophagus are unremarkable. Thyroid unremarkable. Lungs/Pleura: Linear bibasilar subsegmental atelectasis or scarring. No effusions. Musculoskeletal: A lytic expansile lesion within the left scapula. There is a fracture through the left scapula as well. There is a lytic expansile lytic lesion within the lateral left 4th rib. Soft tissue mass measures 6.4 x 4.0 cm. Lucent lesion seen within the T1 vertebral body. Multiple small lytic lesions noted throughout the remainder of the bones including right scapula, bilateral ribs, T11. Review of the MIP images confirms the above findings. CT ABDOMEN and PELVIS FINDINGS  Hepatobiliary: No focal hepatic abnormality. Gallbladder unremarkable. Pancreas: No focal abnormality or ductal dilatation. Spleen: No focal abnormality.  Normal size. Adrenals/Urinary Tract: No adrenal abnormality. No focal renal abnormality. No stones or hydronephrosis. Urinary bladder is unremarkable. Small cyst in the lower pole of the right kidney. Stomach/Bowel: Normal appendix. Stomach, large and small bowel grossly unremarkable. Vascular/Lymphatic: Aortic atherosclerosis. No evidence of aneurysm or adenopathy. Reproductive: Prostate enlargement. Other: No free fluid or free air. Musculoskeletal: Numerous lytic lesions throughout the lumbar spine and bony pelvis. Review of the MIP images confirms the above findings. IMPRESSION: Numerous diffuse low-density lesions throughout the bony structures of the chest abdomen or pelvis including bilateral scapula, multiple ribs, thoracic spine, lumbar spine and bony pelvis. Findings are concerning for  metastasis or myeloma. Fracture through the left scapula, possibly pathologic fracture with the adjacent expansile lytic lesion in the left scapula. Expansile lytic lesion with soft tissue mass in the left lateral 4th rib. No evidence of pulmonary embolus. Coronary artery disease, aortic atherosclerosis. Prostate enlargement. Electronically Signed   By: Rolm Baptise M.D.   On: 06/26/2020 18:34   CT Cervical Spine Wo Contrast  Result Date: 06/26/2020 CLINICAL DATA:  ACDF cervical spine 06/17/2020.  Fall today. EXAM: CT CERVICAL SPINE WITHOUT CONTRAST TECHNIQUE: Multidetector CT imaging of the cervical spine was performed without intravenous contrast. Multiplanar CT image reconstructions were also generated. COMPARISON:  MRI cervical spine 05/28/2020 FINDINGS: Alignment: Normal Skull base and vertebrae: Negative for fracture Lytic lesion T1 vertebral body measuring 14 x 18 mm. Non sclerotic margins. No internal matrix. Lesion extends into the left pedicle. This lesion is  seen on the recent MRI and is low signal on T1 and increased signal on T2 and STIR. Additional 5 x 7 mm lytic lesion in the right lamina of T1. Soft tissues and spinal canal: ACDF C5-6 from a left neck approach. Mild soft tissue edema in the wound and around the left thyroid. No hematoma. Mild prevertebral edema above the level fusion at C3 and C4 without hematoma. Disc levels: C2-3: Moderate facet degeneration on the left with mild left foraminal narrowing. Small central disc protrusion. C3-4: Small central disc protrusion. Left-sided facet degeneration. Mild uncinate spurring without significant stenosis C4-5: Disc degeneration with diffuse uncinate spurring. Mild spinal stenosis. Neural foramina patent bilaterally C5-6: ACDF. Prominent anterior osteophytes cause the plate to be position anteriorly. No significant spinal stenosis. Neural foramina patent. C6-7: Disc degeneration with prominent central osteophyte. Mild spinal stenosis and mild foraminal stenosis bilaterally due to diffuse uncinate spurring C7-T1: Negative for stenosis. Upper chest: Visualized lung apices clear bilaterally. Other: None IMPRESSION: 1. Postop ACDF C5-6. Anterior plate and screws position anteriorly due to prominent anterior osteophyte. Typical postsurgical edema in the left neck. No hematoma or residual spinal stenosis 2. Cervical spondylosis with mild spinal stenosis at C4-5 and C6-7. Mild foraminal stenosis bilaterally C6-7. 3. 14 x 18 mm lytic lesion vertebral body T1. This is also noted on the prior MRI. A second small 5 x 7 mm lytic lesion in the right lamina of T1. These are concerning for neoplasm. Possible myeloma or metastatic disease. Follow-up MRI cervical spine without with contrast recommended for further evaluation. Electronically Signed   By: Franchot Gallo M.D.   On: 06/26/2020 18:48   CT Abdomen Pelvis W Contrast  Result Date: 06/26/2020 CLINICAL DATA:  Dizziness. Recent cervical spine surgery. Fall, scapular  fracture. EXAM: CT ANGIOGRAPHY CHEST CT ABDOMEN AND PELVIS WITH CONTRAST TECHNIQUE: Multidetector CT imaging of the chest was performed using the standard protocol during bolus administration of intravenous contrast. Multiplanar CT image reconstructions and MIPs were obtained to evaluate the vascular anatomy. Multidetector CT imaging of the abdomen and pelvis was performed using the standard protocol during bolus administration of intravenous contrast. CONTRAST:  139m OMNIPAQUE IOHEXOL 350 MG/ML SOLN COMPARISON:  Left shoulder series earlier today. FINDINGS: CTA CHEST FINDINGS Cardiovascular: Is no filling defects in the pulmonary arteries to suggest pulmonary emboli. Coronary artery calcifications diffusely. Scattered aortic calcifications. No aneurysm. Heart is normal size. Mediastinum/Nodes: No mediastinal, hilar, or axillary adenopathy. Trachea and esophagus are unremarkable. Thyroid unremarkable. Lungs/Pleura: Linear bibasilar subsegmental atelectasis or scarring. No effusions. Musculoskeletal: A lytic expansile lesion within the left scapula. There is a fracture through the left scapula  as well. There is a lytic expansile lytic lesion within the lateral left 4th rib. Soft tissue mass measures 6.4 x 4.0 cm. Lucent lesion seen within the T1 vertebral body. Multiple small lytic lesions noted throughout the remainder of the bones including right scapula, bilateral ribs, T11. Review of the MIP images confirms the above findings. CT ABDOMEN and PELVIS FINDINGS Hepatobiliary: No focal hepatic abnormality. Gallbladder unremarkable. Pancreas: No focal abnormality or ductal dilatation. Spleen: No focal abnormality.  Normal size. Adrenals/Urinary Tract: No adrenal abnormality. No focal renal abnormality. No stones or hydronephrosis. Urinary bladder is unremarkable. Small cyst in the lower pole of the right kidney. Stomach/Bowel: Normal appendix. Stomach, large and small bowel grossly unremarkable. Vascular/Lymphatic:  Aortic atherosclerosis. No evidence of aneurysm or adenopathy. Reproductive: Prostate enlargement. Other: No free fluid or free air. Musculoskeletal: Numerous lytic lesions throughout the lumbar spine and bony pelvis. Review of the MIP images confirms the above findings. IMPRESSION: Numerous diffuse low-density lesions throughout the bony structures of the chest abdomen or pelvis including bilateral scapula, multiple ribs, thoracic spine, lumbar spine and bony pelvis. Findings are concerning for metastasis or myeloma. Fracture through the left scapula, possibly pathologic fracture with the adjacent expansile lytic lesion in the left scapula. Expansile lytic lesion with soft tissue mass in the left lateral 4th rib. No evidence of pulmonary embolus. Coronary artery disease, aortic atherosclerosis. Prostate enlargement. Electronically Signed   By: Rolm Baptise M.D.   On: 06/26/2020 18:34   DG Pelvis Portable  Result Date: 07/14/2020 CLINICAL DATA:  Open reduction internal fixation EXAM: PORTABLE PELVIS 1-2 VIEWS COMPARISON:  PET-CT December 08, 2019 FINDINGS: Postoperative screw and rod fixation in proximal left femur. Widespread lytic metastases present, better delineated on recent PET-CT study. No acute fracture or dislocation. There is mild symmetric narrowing of each hip joint. IMPRESSION: Multifocal lytic bone lesions, better delineated on recent PET-CT examination. Postoperative change proximal left femur. No fracture or dislocation. Relatively mild symmetric narrowing of each hip joint noted. Electronically Signed   By: Lowella Grip III M.D.   On: 07/14/2020 15:16   NM PET Image Initial (PI) Skull Base To Thigh  Result Date: 07/07/2020 CLINICAL DATA:  Initial treatment strategy for soft tissue mass in the LEFT scapula. EXAM: NUCLEAR MEDICINE PET SKULL BASE TO THIGH TECHNIQUE: 11.56 mCi F-18 FDG was injected intravenously. Full-ring PET imaging was performed from the skull base to thigh after the  radiotracer. CT data was obtained and used for attenuation correction and anatomic localization. Fasting blood glucose: 106 mg/dl COMPARISON:  CT angiography of the chest FINDINGS: Mediastinal blood pool activity: SUV max 2.81 Liver activity: SUV max 4.06 NECK: No hypermetabolic lymph nodes in the neck. Diffuse muscular activity about the longus coli and longus capitis musculature. Incidental CT findings: Scalp lesions on the LEFT overlying the LEFT occipital parietal region showing some calcification largest approximately 10 x 6.2 mm. CHEST: Chest wall involvement, see below under the musculoskeletal section. No sign of adenopathy in the chest Incidental CT findings: Calcified atheromatous plaque and calcified coronary artery disease. Similar to previous imaging. Non-contrast appearance of heart and great vessels with limited assessment due to lack of intravenous contrast. Signs of basilar atelectasis. No effusion. No consolidation. Tiny pulmonary nodule in the RIGHT upper lobe measuring approximately 7 x 3 mm (image 19, series 8) no increased FDG uptake. ABDOMEN/PELVIS: No abnormal hypermetabolic activity within the liver, pancreas, adrenal glands, or spleen. No hypermetabolic lymph nodes in the abdomen or pelvis. Incidental CT findings: Liver with  normal size and contour. No pericholecystic stranding. No gross biliary duct dilation. No peripancreatic inflammation. Spleen normal in size and contour. No hydronephrosis. Small LEFT intrarenal calculus in the interpolar LEFT kidney. SKELETON: Multifocal areas of lytic and destructive changes. LEFT scapular lesion with associated soft tissue and bony destruction as well as pathologic fracture is unchanged with respect to size displaying a maximum SUV of 15.1 (image 64, series 4) Numerous rib lesions are present bilaterally displaying increased FDG uptake. The largest of these is a expansile lesion in the chest wall destroying the LEFT fourth rib and third ribs with  lenticular morphology measuring approximately 5.8 x 3.6 cm, size could be altered based on changes and chest wall orientation. Overall similar to the previous study. (SUVmax = 5.8) RIGHT humeral lesions also with increased metabolic activity (image 57, series 4) 3 x 2.3 cm area of increased metabolic activity and destructive change (SUVmax = 5.8, adjacent smaller lesion with increased metabolic activity as well in the greater tuberosity 1.4 cm with a maximum SUV of 9.1) Subtle lytic foci in the proximal LEFT humerus as well with punched-out cortical lesions. RIGHT scapular lesion also seen on image 58, 1.7 cm also with increased metabolic activity. Innumerable other foci of lytic change throughout nearly every visualized level of the thoracic and lumbar spine. Area of greatest involvement in the upper thoracic spine is the T1 vertebral body which was referenced on the previous cervical spine CT displaying early posterior cortical disruption. This area measures approximately 1.8 x 1.4 cm anterior plate and screw fixation of the cervical spine is just above this level. T10 and T12 also display marked hypermetabolism associated with lytic changes (image 98, series 4) 12 mm area along the lateral cortex of the vertebral body with a maximum SUV of 6.1 T12 lesion on image 113 of series 4 measuring approximately 13-14 mm with a maximum SUV of 11.1. Diffuse activity within L2, associated with a large lytic focus that measures approximately 2.0 x 2.1 cm., marked hypermetabolic focus within the sacrum and multifocal lytic changes within the bilateral iliac bones, largest on the LEFT measuring approximately 2.9 x 1.3 cm All areas of the pelvis are involved to some extent with the iliac bones showing greatest involvement. LEFT femoral neck lesion at risk for pathologic fracture occupying nearly the entire LEFT femoral neck measuring 3.7 x 2.5 cm (image 187, series 4) (SUVmax = 4.9) signs of spinal fusion in the cervical spine.  Relative sparing of the cervical spine Incidental CT findings: none IMPRESSION: Multifocal lytic bone lesions, all levels of thoracic and lumbar spine show involvement, lytic changes in the LEFT scapula with destruction and pathologic fracture and destruction of LEFT-sided ribs with dominant chest wall lesion as described. Multifocal involvement also involving the bilateral humeri and the bilateral femora most notably in the LEFT proximal femur where there is a large destructive lesion at risk for pathologic fracture involving nearly the entire LEFT femoral neck. Imaging characteristics are most suggestive of either myeloma or metastatic disease. Of primary neoplasms that could present in this fashion melanoma is the leading consideration. This is in the absence of a lung lesion which would explain these findings. Small RIGHT upper lobe pulmonary nodule measuring 7 x 4 mm is nonspecific, attention on follow-up. No signs of adenopathy or splenic enlargement. These results will be called to the ordering clinician or representative by the Radiologist Assistant, and communication documented in the PACS or [employer]. Electronically Signed   By: Jewel Baize.D.  On: 07/07/2020 15:36   CT Biopsy  Result Date: 07/09/2020 INDICATION: 69 year old with multiple lytic bone lesions. Tissue diagnosis is needed. Patient has a left scapular fracture with an expansile lytic lesion. EXAM: CT-GUIDED BIOPSY OF LEFT SCAPULA BONE LESION MEDICATIONS: None. ANESTHESIA/SEDATION: Moderate (conscious) sedation was employed during this procedure. A total of Versed 2.0 mg and Fentanyl 100 mcg was administered intravenously. Moderate Sedation Time: 14 minutes minutes. The patient's level of consciousness and vital signs were monitored continuously by radiology nursing throughout the procedure under my direct supervision. FLUOROSCOPY TIME:  None COMPLICATIONS: None immediate. PROCEDURE: Informed written consent was obtained from  the patient after a thorough discussion of the procedural risks, benefits and alternatives. All questions were addressed. A timeout was performed prior to the initiation of the procedure. Patient was placed in a right lateral decubitus position. CT images through the chest were obtained. The expansile lytic lesion in left scapula was identified and targeted. The overlying skin was prepped with chlorhexidine and sterile field was created. Skin was anesthetized using 1% lidocaine. Small incision was made. Using CT guidance, 17 gauge coaxial needle was directed into the lucent bone lesion. Multiple soft tissue core biopsies were obtained with an 18 gauge core device. Adequate specimens were obtained and placed in formalin. The 17 gauge needle was removed without complication. Bandage placed over the puncture site. FINDINGS: Fracture of the left scapula with an expansile lytic lesion. Biopsy needle was confirmed within the expansile lytic lesion. Multiple soft tissue core biopsies were obtained from the lesion. Again noted is a large expansile lesion involving the left lateral chest. Additional lucent bone lesions. IMPRESSION: Successful CT-guided core biopsy of the expansile lytic lesion involving the left scapula. Electronically Signed   By: Markus Daft M.D.   On: 07/09/2020 11:32   DG Shoulder Left  Result Date: 06/26/2020 CLINICAL DATA:  Recent fall with left shoulder pain, initial encounter EXAM: LEFT SHOULDER - 2+ VIEW COMPARISON:  None. FINDINGS: Degenerative changes of the acromioclavicular joint are noted. Mild glenohumeral degenerative changes are seen as well. Comminuted scapular fracture is noted in the mid body. Additionally there is an erosive lesion involving the fourth left rib posterolaterally likely representing metastatic disease. Lucencies are noted within the proximal humeral shaft which are better visualized than that seen on prior exam also likely representing metastatic disease. CT of the  chest is recommended for further evaluation. IMPRESSION: Comminuted fracture of the scapula. Given the findings described below this is felt to be a pathologic fracture. Multiple lucent lesions within the proximal left humerus consistent with metastatic disease. Erosive changes of the fourth left rib laterally with underlying soft tissue mass consistent with metastatic disease. CT of the chest is recommended for further evaluation. Electronically Signed   By: Inez Catalina M.D.   On: 06/26/2020 16:14   DG C-Arm 1-60 Min  Result Date: 07/14/2020 CLINICAL DATA:  ORIF of left femur fracture EXAM: LEFT FEMUR 2 VIEWS; DG C-ARM 1-60 MIN COMPARISON:  None. FINDINGS: The patient is status post ORIF with intramedullary rod and screw fixation the femur fracture. Six intraop views were submitted for review. Fluoro time 1 minutes 39 seconds IMPRESSION: Intraop views of ORIF of proximal femur fracture. Electronically Signed   By: Prudencio Pair M.D.   On: 07/14/2020 15:16   DG FEMUR MIN 2 VIEWS LEFT  Result Date: 07/14/2020 CLINICAL DATA:  ORIF of left femur fracture EXAM: LEFT FEMUR 2 VIEWS; DG C-ARM 1-60 MIN COMPARISON:  None. FINDINGS: The patient is  status post ORIF with intramedullary rod and screw fixation the femur fracture. Six intraop views were submitted for review. Fluoro time 1 minutes 39 seconds IMPRESSION: Intraop views of ORIF of proximal femur fracture. Electronically Signed   By: Prudencio Pair M.D.   On: 07/14/2020 15:16   DG FEMUR PORT MIN 2 VIEWS LEFT  Result Date: 07/14/2020 CLINICAL DATA:  Status post open reduction and internal fixation for fracture EXAM: LEFT FEMUR PORTABLE 2 VIEWS COMPARISON:  Intraoperative left femur images July 14, 2020. PET-CT July 07, 2020 FINDINGS: Frontal and lateral views obtained. There is screw and rod fixation through the left femoral shaft. Tip of the screw is in the proximal left femur. Note that there is fixation through the lytic lesion in the left lateral  femoral neck. Alignment in this area is anatomic. There is mild narrowing of the left hip joint. Moderate narrowing of medial and lateral components of left knee. No fracture or dislocation. There is a lytic lesion in the proximal fibular diaphysis as well as a lytic lesion in the proximal tibia midline slightly inferior to the tibial spines. IMPRESSION: Postoperative change with screw and rod transfixing lytic lesion in the left femoral neck region. Alignment anatomic. No acute fracture or dislocation. Mild narrowing left hip joint. Moderate narrowing left knee joint. Note lytic lesion in proximal fibular diaphysis and proximal tibia immediately inferior to the tibial spines. Electronically Signed   By: Lowella Grip III M.D.   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  He would be a good candidate for short course of palliative radiation therapy directed this area.  In addition patient complains of significant pain in the lower lumbar upper sacrum area.  On PET scan he has significant disease in this area will be a candidate for short course of radiation therapy directed this area.  In addition would also recommend postoperative treatments to the left femur region.  Today, I talked to the patient and wife about the findings and work-up thus far.  We discussed the natural history of multiple myeloma and general treatment, highlighting the role of radiotherapy in the management.  We discussed the available radiation techniques, and focused on the details of logistics and delivery.  We reviewed the anticipated acute and late sequelae associated with radiation in this setting.  The patient was encouraged to ask questions that I answered to the best of my ability.  A patient consent form was discussed and signed.  We  retained a copy for our records.  The patient would like to proceed with radiation and will be scheduled for CT simulation.  PLAN: The patient will return tomorrow for CT simulation.  He will have set up of 3 separate isocenters that being the left scapula left rib mass.  The second isocenter will be located the upper sacrum area and a third will be located along the left femur.  Treatments to begin early next week.  Anticipate 10 treatments (2 weeks) of radiation therapy directed at the above-mentioned areas.  Total time spent in this encounter was 65 minutes which included reviewing the patient's most recent ED visit, CT scans, PET scan, consultations, follow-ups, surgeries, biopsies, pathology reports, physical examination, and documentation.   ------------------------------------------------  Blair Promise, PhD, MD  This document serves as a record of  services personally performed by Gery Pray, MD. It was created on his behalf by Clerance Lav, a trained medical scribe. The creation of this record is based on the scribe's personal observations and the provider's statements to them. This document has been checked and approved by the attending provider.

## 2020-07-22 NOTE — Telephone Encounter (Signed)
No los 9/21

## 2020-07-23 ENCOUNTER — Encounter: Payer: Self-pay | Admitting: *Deleted

## 2020-07-23 ENCOUNTER — Other Ambulatory Visit: Payer: Self-pay

## 2020-07-23 ENCOUNTER — Telehealth: Payer: Self-pay | Admitting: Pharmacy Technician

## 2020-07-23 ENCOUNTER — Encounter: Payer: Self-pay | Admitting: General Practice

## 2020-07-23 ENCOUNTER — Ambulatory Visit
Admission: RE | Admit: 2020-07-23 | Discharge: 2020-07-23 | Disposition: A | Discharge status: Home / Self Care | Payer: BC Managed Care – PPO | Source: Ambulatory Visit | Attending: Radiation Oncology | Admitting: Radiation Oncology

## 2020-07-23 DIAGNOSIS — C7951 Secondary malignant neoplasm of bone: Secondary | ICD-10-CM | POA: Insufficient documentation

## 2020-07-23 DIAGNOSIS — C9000 Multiple myeloma not having achieved remission: Secondary | ICD-10-CM | POA: Insufficient documentation

## 2020-07-23 DIAGNOSIS — Z510 Encounter for antineoplastic radiation therapy: Secondary | ICD-10-CM | POA: Diagnosis not present

## 2020-07-23 MED ORDER — LENALIDOMIDE 25 MG PO CAPS: 25.0000 mg | ORAL_CAPSULE | Freq: Every day | ORAL | 0 refills | Status: DC

## 2020-07-23 NOTE — Progress Notes (Signed)
Patient radiation plan made. Per Dr [name] he would like patient to start his treatment after his radiation is complete. Chemo education, nutrition consult and initiation of chemotherapy all scheduled to start 08/11/2020. Dr [name] has decided that his treatment will all be delivered sub-q and therefor a port is not necessary.  Spoke with patient's wife, per patient request, and reviewed all the appointments that were scheduled. Answered her questions about treatment schedule, expectations, and general chemo questions to her satisfaction.   Oncology Nurse Navigator Documentation  Oncology Nurse Navigator Flowsheets 07/23/2020  Abnormal Finding Date -  Confirmed Diagnosis Date -  Diagnosis Status -  Planned Course of Treatment Radiation  Phase of Treatment Radiation  Radiation Actual Start Date: 07/28/2020  Navigator Follow Up Date: 08/11/2020  Navigator Follow Up Reason: Chemotherapy;Chemo Class  Production assistant, radio Encounter Type Appt/Treatment Plan Review;Telephone  Telephone Outgoing Call;Appt Confirmation/Clarification;Education  Patient Visit Type MedOnc  Treatment Phase Pre-Tx/Tx Discussion  Barriers/Navigation Needs Coordination of Care;Education  Education Other  Interventions Coordination of Care;Education;Psycho-Social Support;Referrals  Acuity Level 2-Minimal Needs (1-2 Barriers Identified)  Referrals Nutrition/dietician  Coordination of Care Appts  Education Method Verbal  Support Groups/Services Friends and Family  Time Spent with Patient 31

## 2020-07-23 NOTE — Telephone Encounter (Signed)
Oral Oncology Patient Advocate Encounter   Received notification from [facility] that prior authorization for Revlimid is required.   PA submitted on CoverMyMeds Key [id number] Status is pending   Oral Oncology Clinic will continue to follow.  [place] Patient [place] Phone [phone] Fax [phone] 07/24/2020 8:44 AM

## 2020-07-23 NOTE — Progress Notes (Signed)
[place] Psychosocial Distress Screening Clinical Social Work  Clinical Social Work was referred by distress screening protocol.  The patient scored a 8 on the Psychosocial Distress Thermometer which indicates moderate distress. Clinical Social Worker did not contact patient as referral indicated he had declined social work referral.  Please reconsult social work if needs arise in the future.  to assess for distress and other psychosocial needs.   ONCBCN DISTRESS SCREENING 07/22/2020  Distress experienced in past week (1-10) 8  Other Has severe nausea, declined social work referral    Clinical Social Worker follow up needed: No.  If yes, follow up plan:  Beverely Pace, [place], LCSW Clinical Social Worker Phone:  [phone]

## 2020-07-23 NOTE — Addendum Note (Signed)
Addended by: Burney Gauze R on: 07/23/2020 02:24 PM   Modules accepted: Orders

## 2020-07-24 ENCOUNTER — Other Ambulatory Visit: Payer: Self-pay

## 2020-07-24 ENCOUNTER — Encounter: Payer: Self-pay | Admitting: *Deleted

## 2020-07-24 ENCOUNTER — Telehealth: Payer: Self-pay | Admitting: Pharmacist

## 2020-07-24 DIAGNOSIS — C9000 Multiple myeloma not having achieved remission: Secondary | ICD-10-CM

## 2020-07-24 MED ORDER — LENALIDOMIDE 25 MG PO CAPS: 25.0000 mg | ORAL_CAPSULE | Freq: Every day | ORAL | 0 refills | Status: DC

## 2020-07-24 NOTE — Progress Notes (Signed)
Patient's wife wanted clarification on treatment scheduled. I spoke with Dr [name] who states he will initially get treatment weekly for 8 weeks, and then transition at that time. His oral Revlimid will be 21/28.   Called Bruce Smith and notified her of what Dr [name] relayed. Answered her questions to her satisfaction. She knows to expect a call from specialty pharmacy regarding the Revlimid, and that if they co-pay is higher than they can pay, to call us back and we'll start the financial aid process.   Oncology Nurse Navigator Documentation  Oncology Nurse Navigator Flowsheets 07/24/2020  Abnormal Finding Date -  Confirmed Diagnosis Date -  Diagnosis Status -  Planned Course of Treatment -  Phase of Treatment -  Radiation Actual Start Date: -  Radiation Expected End Date: -  Navigator Follow Up Date: 08/11/2020  Navigator Follow Up Reason: Chemo Class;Chemotherapy  Production assistant, radio Encounter Type Telephone  Telephone Outgoing Call  Patient Visit Type MedOnc  Treatment Phase Pre-Tx/Tx Discussion  Barriers/Navigation Needs Coordination of International aid/development worker for Upcoming Surgery/ Treatment  Interventions Psycho-Social Support;Education  Acuity Level 2-Minimal Needs (1-2 Barriers Identified)  Referrals -  Coordination of Care -  Education Method Verbal  Support Groups/Services Friends and Family  Time Spent with Patient 30

## 2020-07-24 NOTE — Telephone Encounter (Signed)
Oral Oncology Patient Advocate Encounter  Prior Authorization for Revlimid has been approved.    PA# [id number] Effective dates not given on CMM.  Patient must use [facility] per insurance.  Oral Oncology Clinic will continue to follow.   [place] Patient [place] Phone [phone] Fax [phone] 07/24/2020 8:48 AM

## 2020-07-27 ENCOUNTER — Other Ambulatory Visit: Payer: Self-pay | Admitting: Radiology

## 2020-07-27 ENCOUNTER — Institutional Professional Consult (permissible substitution): Payer: BC Managed Care – PPO | Admitting: Radiation Oncology

## 2020-07-27 ENCOUNTER — Ambulatory Visit: Payer: BC Managed Care – PPO

## 2020-07-27 LAB — UPEP/UIFE/LIGHT CHAINS/TP, 24-HR UR
% BETA, Urine: 0 %
ALPHA 1 URINE: 0 %
Albumin, U: 100 %
Alpha 2, Urine: 0 %
Free Kappa Lt Chains,Ur: 19.2 mg/L (ref 0.63–113.79)
Free Kappa/Lambda Ratio: 13.24 (ref 1.03–31.76)
Free Lambda Lt Chains,Ur: 1.45 mg/L (ref 0.47–11.77)
GAMMA GLOBULIN URINE: 0 %
Total Protein, Urine-Ur/day: 114 mg/24 hr (ref 30–150)
Total Protein, Urine: 7.1 mg/dL
Total Volume: 1600

## 2020-07-27 NOTE — Telephone Encounter (Signed)
Oral Oncology Pharmacy Student Encounter  Received new prescription for Revlimid (lenalidomide) for the treatment of multiple myeloma  in conjunction with bortezomib, dexamethasone, and daratumumab planned duration until disease progression or unacceptable drug toxicity.  CMP and CBC with differential from 07/21/2020 assessed, grossly unremarkable. Prescription dose and frequency assessed.   Current medication list in Epic reviewed, no DDIs with Revlimid identified.  Evaluated chart and no patient barriers to medication adherence identified.   Prescription has been e-scribed to the [facility] for benefits analysis and approval. Received PA approval; patient must use [facility] per insurance.  Oral Oncology Clinic will continue to follow for copayment issues, initial counseling and start date.  Lowella Fairy D Candidate 2022  [facility] Oral Chemotherapy Navigation Clinic [phone]  07/27/2020 11:10 AM

## 2020-07-28 ENCOUNTER — Other Ambulatory Visit: Payer: Self-pay

## 2020-07-28 ENCOUNTER — Encounter (HOSPITAL_COMMUNITY): Payer: Self-pay

## 2020-07-28 ENCOUNTER — Ambulatory Visit
Admission: RE | Admit: 2020-07-28 | Discharge: 2020-07-28 | Disposition: A | Discharge status: Home / Self Care | Payer: BC Managed Care – PPO | Source: Ambulatory Visit | Attending: Radiation Oncology | Admitting: Radiation Oncology

## 2020-07-28 ENCOUNTER — Other Ambulatory Visit: Payer: Self-pay | Admitting: Radiation Oncology

## 2020-07-28 ENCOUNTER — Ambulatory Visit (HOSPITAL_COMMUNITY)
Admission: RE | Admit: 2020-07-28 | Discharge: 2020-07-28 | Disposition: A | Discharge status: Home / Self Care | Payer: BC Managed Care – PPO | Source: Ambulatory Visit | Attending: Hematology & Oncology | Admitting: Hematology & Oncology

## 2020-07-28 ENCOUNTER — Other Ambulatory Visit: Payer: Self-pay | Admitting: Family

## 2020-07-28 ENCOUNTER — Encounter: Payer: Self-pay | Admitting: *Deleted

## 2020-07-28 ENCOUNTER — Telehealth: Payer: Self-pay

## 2020-07-28 DIAGNOSIS — C9000 Multiple myeloma not having achieved remission: Secondary | ICD-10-CM

## 2020-07-28 DIAGNOSIS — M47812 Spondylosis without myelopathy or radiculopathy, cervical region: Secondary | ICD-10-CM | POA: Diagnosis not present

## 2020-07-28 DIAGNOSIS — M4322 Fusion of spine, cervical region: Secondary | ICD-10-CM | POA: Insufficient documentation

## 2020-07-28 DIAGNOSIS — E785 Hyperlipidemia, unspecified: Secondary | ICD-10-CM | POA: Insufficient documentation

## 2020-07-28 DIAGNOSIS — Z79891 Long term (current) use of opiate analgesic: Secondary | ICD-10-CM | POA: Insufficient documentation

## 2020-07-28 DIAGNOSIS — D649 Anemia, unspecified: Secondary | ICD-10-CM | POA: Insufficient documentation

## 2020-07-28 DIAGNOSIS — I2510 Atherosclerotic heart disease of native coronary artery without angina pectoris: Secondary | ICD-10-CM | POA: Insufficient documentation

## 2020-07-28 DIAGNOSIS — C7951 Secondary malignant neoplasm of bone: Secondary | ICD-10-CM | POA: Diagnosis not present

## 2020-07-28 DIAGNOSIS — Z87891 Personal history of nicotine dependence: Secondary | ICD-10-CM | POA: Insufficient documentation

## 2020-07-28 DIAGNOSIS — I10 Essential (primary) hypertension: Secondary | ICD-10-CM | POA: Insufficient documentation

## 2020-07-28 DIAGNOSIS — Z7984 Long term (current) use of oral hypoglycemic drugs: Secondary | ICD-10-CM | POA: Insufficient documentation

## 2020-07-28 DIAGNOSIS — Z79899 Other long term (current) drug therapy: Secondary | ICD-10-CM | POA: Diagnosis not present

## 2020-07-28 DIAGNOSIS — Z7901 Long term (current) use of anticoagulants: Secondary | ICD-10-CM | POA: Diagnosis not present

## 2020-07-28 DIAGNOSIS — I252 Old myocardial infarction: Secondary | ICD-10-CM | POA: Insufficient documentation

## 2020-07-28 LAB — CBC WITH DIFFERENTIAL/PLATELET
Abs Immature Granulocytes: 0.11 10*3/uL — ABNORMAL HIGH (ref 0.00–0.07)
Basophils Absolute: 0 10*3/uL (ref 0.0–0.1)
Basophils Relative: 0 %
Eosinophils Absolute: 0.1 10*3/uL (ref 0.0–0.5)
Eosinophils Relative: 1 %
HCT: 38.7 % — ABNORMAL LOW (ref 39.0–52.0)
Hemoglobin: 12.9 g/dL — ABNORMAL LOW (ref 13.0–17.0)
Immature Granulocytes: 1 %
Lymphocytes Relative: 30 %
Lymphs Abs: 3.4 10*3/uL (ref 0.7–4.0)
MCH: 29.2 pg (ref 26.0–34.0)
MCHC: 33.3 g/dL (ref 30.0–36.0)
MCV: 87.6 fL (ref 80.0–100.0)
Monocytes Absolute: 1.2 10*3/uL — ABNORMAL HIGH (ref 0.1–1.0)
Monocytes Relative: 11 %
Neutro Abs: 6.4 10*3/uL (ref 1.7–7.7)
Neutrophils Relative %: 57 %
Platelets: 335 10*3/uL (ref 150–400)
RBC: 4.42 MIL/uL (ref 4.22–5.81)
RDW: 13.5 % (ref 11.5–15.5)
WBC: 11.3 10*3/uL — ABNORMAL HIGH (ref 4.0–10.5)
nRBC: 0 % (ref 0.0–0.2)

## 2020-07-28 IMAGING — CT CT BIOPSY
1 of 2 series · 15 of 28 positions shown, 19 images · non-contrast
Comparison: none

INDICATION: 69-year-old male with new diagnosis of myeloma. Assess for marrow
involvement.

[Series 2: i-spiral 5.0 br40 · axial · 0.98mm/px · z∈[-509,-428]mm · 15 of 27 slices shown, 19 images]
[im 2/27  mediastinal]
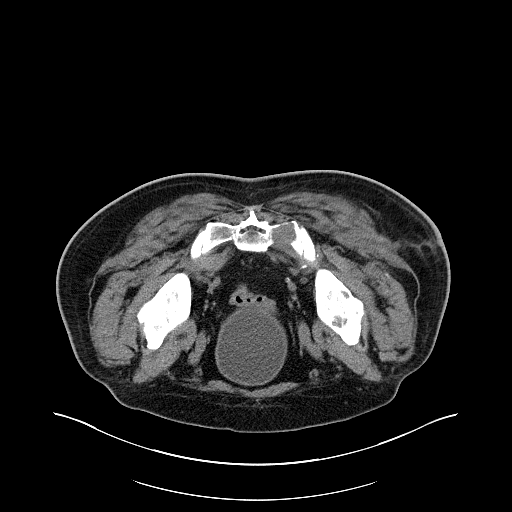
[im 2/27  lung]
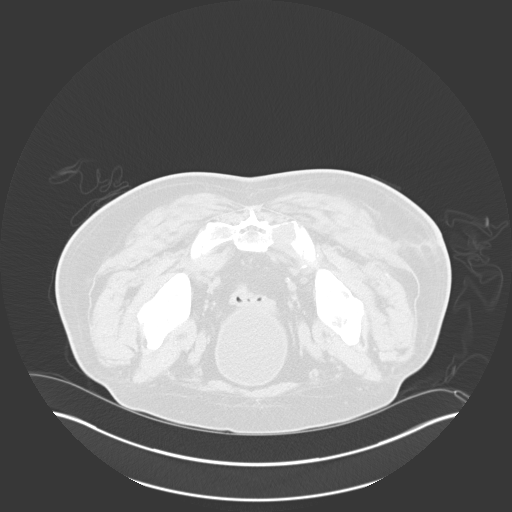
[im 4/27  lung]
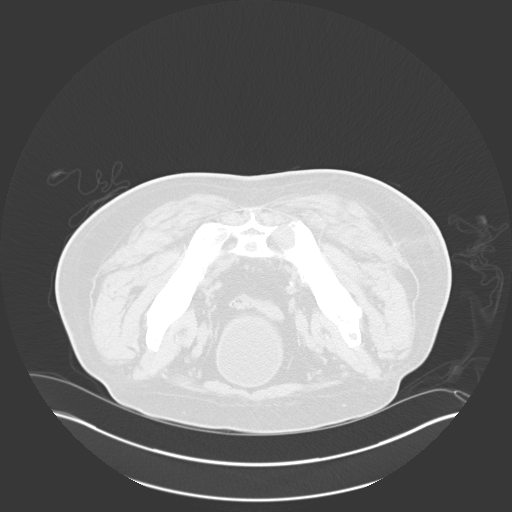
[im 5/27  lung]
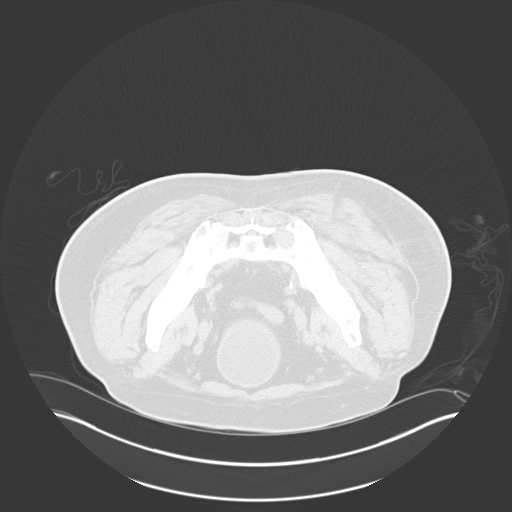
[im 7/27  lung]
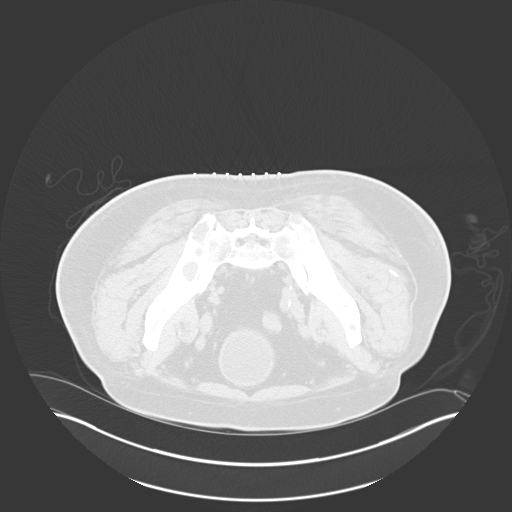
[im 8/27  mediastinal]
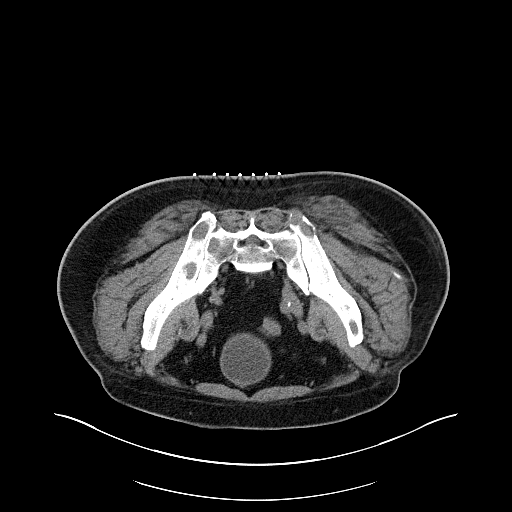
[im 8/27  lung]
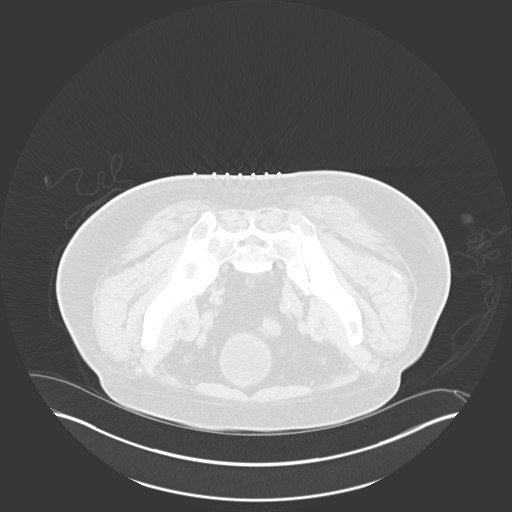
[im 10/27  lung]
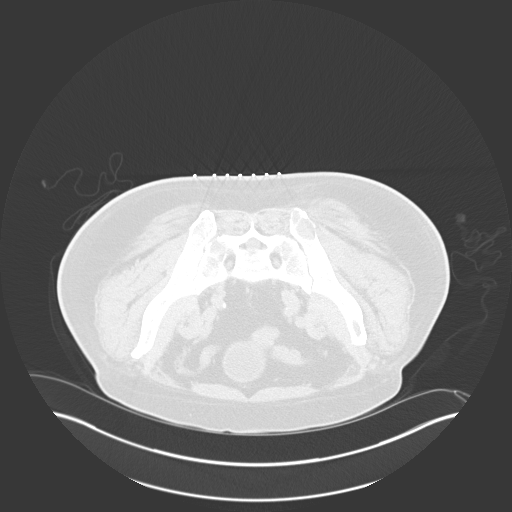
[im 11/27  lung]
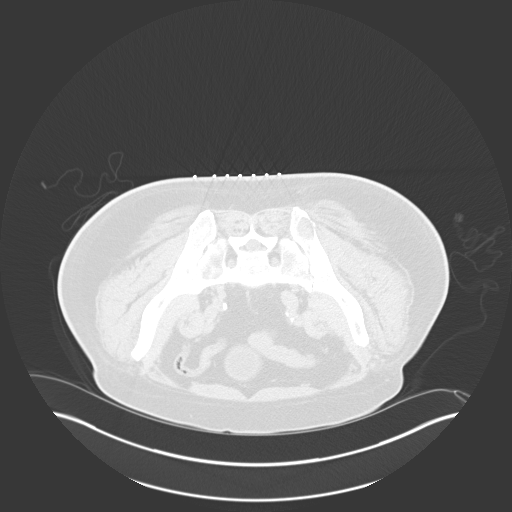
[im 14/27  lung]
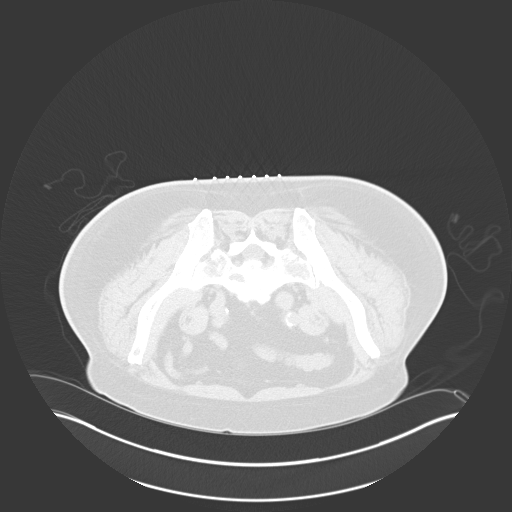
[im 16/27  mediastinal]
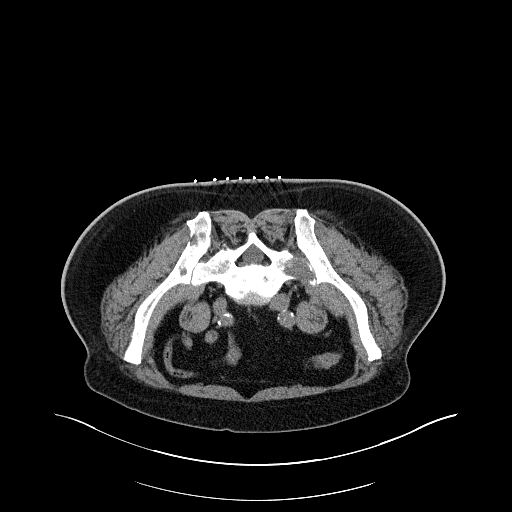
[im 16/27  lung]
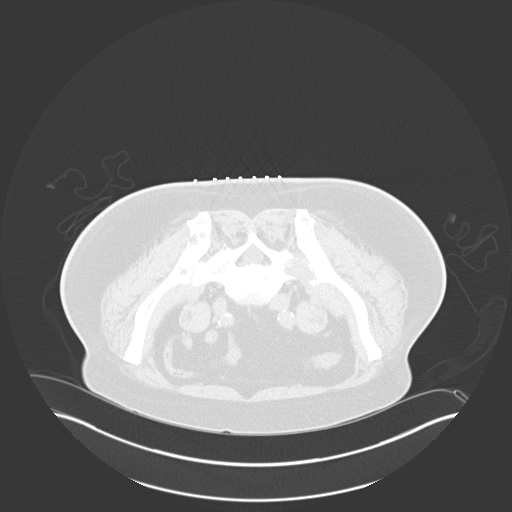
[im 17/27  lung]
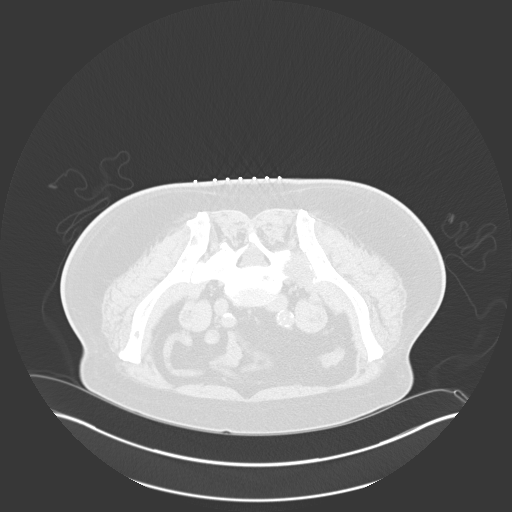
[im 19/27  lung]
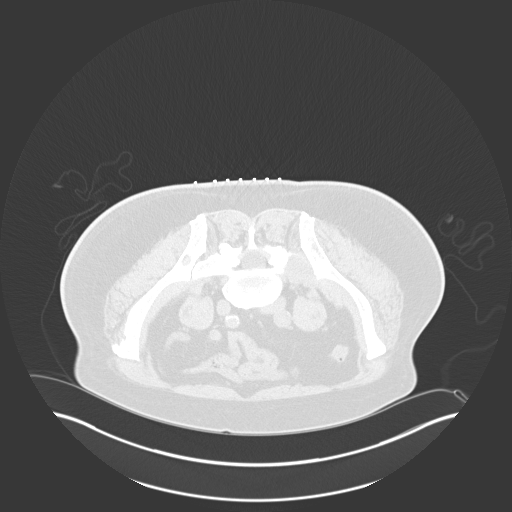
[im 20/27  lung]
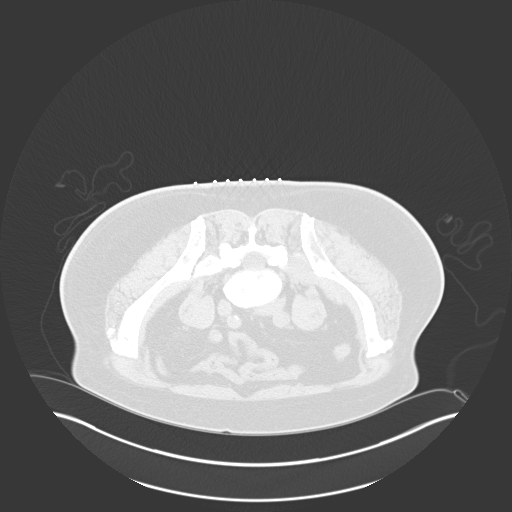
[im 22/27  mediastinal]
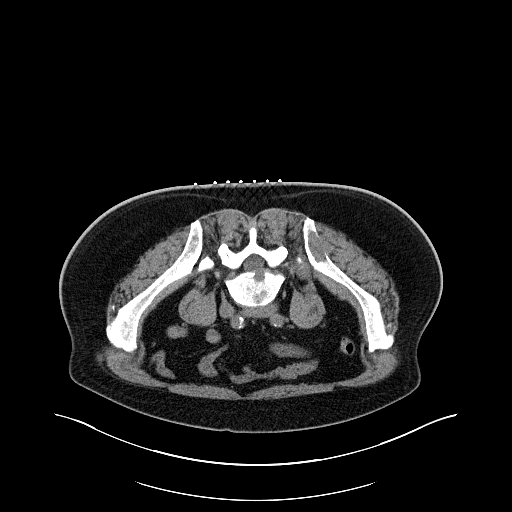
[im 22/27  lung]
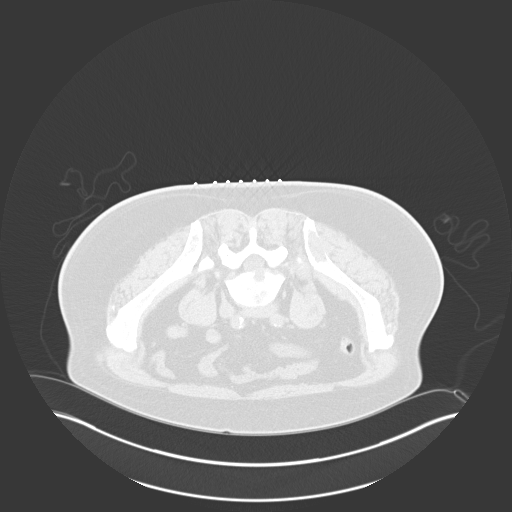
[im 23/27  lung]
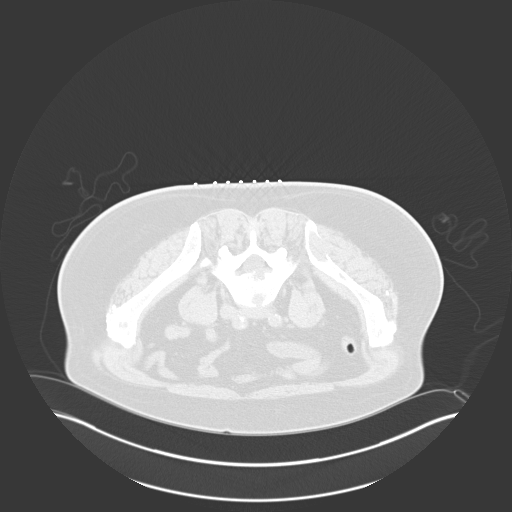
[im 25/27  lung]
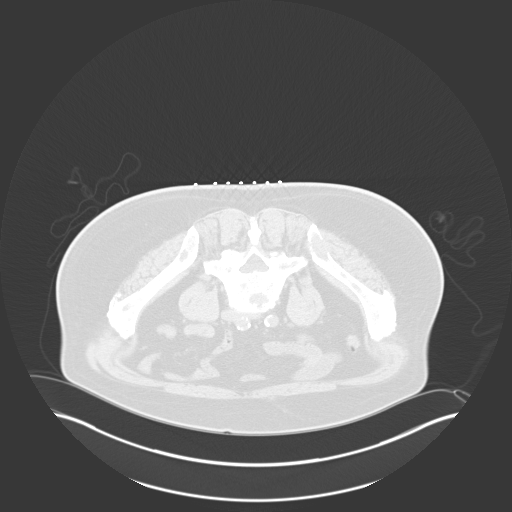

[15 of 28 positions shown; findings below may reference images not displayed]

EXAM:
CT GUIDED BONE MARROW ASPIRATION AND CORE BIOPSY

MEDICATIONS:
None.

ANESTHESIA/SEDATION:
Moderate (conscious) sedation was employed during this procedure. A
total of 3 milligrams versed and 100 micrograms fentanyl were
administered intravenously. The patient's level of consciousness and
vital signs were monitored continuously by radiology nursing
throughout the procedure under my direct supervision.

Total monitored sedation time: 12 minutes

FLUOROSCOPY TIME:  None

COMPLICATIONS:
None immediate.

Estimated blood loss: <25 mL

PROCEDURE:
Informed written consent was obtained from the patient after a
thorough discussion of the procedural risks, benefits and
alternatives. All questions were addressed. Maximal Sterile Barrier
Technique was utilized including caps, mask, sterile gowns, sterile
gloves, sterile drape, hand hygiene and skin antiseptic. A timeout
was performed prior to the initiation of the procedure.

The patient was positioned prone and non-contrast localization CT
was performed of the pelvis to demonstrate the iliac marrow spaces.

Maximal barrier sterile technique utilized including caps, mask,
sterile gowns, sterile gloves, large sterile drape, hand hygiene,
and betadine prep.

Under sterile conditions and local anesthesia, an 11 gauge coaxial
bone biopsy needle was advanced into the right iliac marrow space.
Needle position was confirmed with CT imaging. Initially, bone
marrow aspiration was performed. Next, the 11 gauge outer cannula
was utilized to obtain a right iliac bone marrow core biopsy. Needle
was removed. Hemostasis was obtained with compression. The patient
tolerated the procedure well. Samples were prepared with the
cytotechnologist.
IMPRESSION: Successful CT-guided bone marrow aspiration and core biopsy.

## 2020-07-28 MED ORDER — NALOXONE HCL 0.4 MG/ML IJ SOLN
INTRAMUSCULAR | Status: AC
  Filled 2020-07-28: qty 1

## 2020-07-28 MED ORDER — MIDAZOLAM HCL 2 MG/2ML IJ SOLN
INTRAMUSCULAR | Status: AC | PRN
  Administered 2020-07-28 (×3): 1 mg via INTRAVENOUS

## 2020-07-28 MED ORDER — SODIUM CHLORIDE 0.9 % IV SOLN: INTRAVENOUS | Status: DC

## 2020-07-28 MED ORDER — PROCHLORPERAZINE MALEATE 10 MG PO TABS: 10.0000 mg | ORAL_TABLET | Freq: Four times a day (QID) | ORAL | 0 refills | Status: DC | PRN

## 2020-07-28 MED ORDER — FENTANYL CITRATE (PF) 100 MCG/2ML IJ SOLN
INTRAMUSCULAR | Status: AC | PRN
  Administered 2020-07-28 (×2): 50 ug via INTRAVENOUS

## 2020-07-28 MED ORDER — MIDAZOLAM HCL 2 MG/2ML IJ SOLN
INTRAMUSCULAR | Status: AC
  Filled 2020-07-28: qty 4

## 2020-07-28 MED ORDER — FLUMAZENIL 0.5 MG/5ML IV SOLN
INTRAVENOUS | Status: AC
  Filled 2020-07-28: qty 5

## 2020-07-28 MED ORDER — FENTANYL CITRATE (PF) 100 MCG/2ML IJ SOLN
INTRAMUSCULAR | Status: AC
  Filled 2020-07-28: qty 4

## 2020-07-28 MED ORDER — LIDOCAINE HCL (PF) 1 % IJ SOLN
INTRAMUSCULAR | Status: AC | PRN
  Administered 2020-07-28: 10 mL via INTRADERMAL

## 2020-07-28 NOTE — Telephone Encounter (Signed)
Oral Chemotherapy Pharmacy Student Encounter  Patient Education I spoke with patient's wife, Mrs. Crabbe, for overview of new oral chemotherapy medication: Revlimid (lenalidomide) for the treatment of multiple myeloma, planned duration until disease progression or unacceptable drug toxicity.   Pt is doing well. Counseled patient on administration, dosing, side effects, monitoring, drug-food interactions, safe handling, storage, and disposal. Patient will take 1 capsule (25 mg total) by mouth daily. 21 days on, then 7 days off.  Side effects include but not limited to: rash, fatigue, N/V/D, neutropenia.    Reviewed with patient importance of keeping a medication schedule and plan for any missed doses.  After discussion with patient no patient barriers to medication adherence identified.   Mrs. Browe voiced understanding and appreciation. Provided the phone number for [facility] for future fills. All questions answered.  Provided patient with [facility] phone number. Patient knows to call the office with questions or concerns. Oral Chemotherapy Navigation Clinic will continue to follow.   Laurey Arrow, PharmD Candidate 2022 [facility] Oral Chemotherapy Navigation [facility] [phone]  07/28/2020 9:29 AM

## 2020-07-28 NOTE — Progress Notes (Signed)
Called and spoke to patient's wife. Gave the following results Call - the urine actually looks ok!! No obvious light chain in the urine!! Laurey Arrow  Wife, Joelene Millin, had several questions about his upcoming treatment. These were answered to her satisfaction. She also mentions that patient has had significant difficulty with nausea. He has tried the ativan, zofran and ativan with minimal relief. She asks if they can try some home remedies. Dr [name] is okay with natural alternatives for nausea treatment. Also reviewed the possibility of trying Marinol, but at this time they will try a more natural approach.  Oncology Nurse Navigator Documentation  Oncology Nurse Navigator Flowsheets 07/28/2020  Abnormal Finding Date -  Confirmed Diagnosis Date -  Diagnosis Status -  Planned Course of Treatment -  Phase of Treatment -  Radiation Actual Start Date: -  Radiation Expected End Date: -  Navigator Follow Up Date: 08/11/2020  Navigator Follow Up Reason: Chemo Class;Chemotherapy  Production assistant, radio Encounter Type Telephone  Telephone Diagnostic Results;Symptom Mgt;Outgoing Call  Patient Visit Type MedOnc  Treatment Phase Pre-Tx/Tx Discussion  Barriers/Navigation Needs Coordination of Care;Education  Education Preparing for Upcoming Surgery/ Treatment;Pain/ Symptom Management  Interventions Education;Psycho-Social Support  Acuity Level 2-Minimal Needs (1-2 Barriers Identified)  Referrals -  Coordination of Care -  Education Method Verbal  Support Groups/Services Friends and Family  Time Spent with Patient 30

## 2020-07-28 NOTE — Telephone Encounter (Cosign Needed)
Oral Chemotherapy Pharmacy Student Encounter   Patient's wife called back after initial patient education phone call regarding his Revlimid copay amount. She had called [facility] to get copay information but said that they needed further information from pharmacy. After contacting [facility], the original prescription had been sent to wrong address (mail order).   Provided a verbal over the phone and added the correct pharmacy address to the patient's chart.   Called the wife back and informed her about possible delay in getting copay information due to prescription error. Provided information about [employer]'s copay program ($25) and instructed her to ask [facility] to obtain and apply copay card to prescription.   Laurey Arrow, PharmD Candidate 2022 [facility] Oral Chemotherapy Navigation [facility] [phone]  07/28/2020 9:59 AM

## 2020-07-28 NOTE — Procedures (Signed)
Interventional Radiology Procedure Note  Procedure: CT guided aspirate and core biopsy of right iliac bone Complications: None Recommendations: - Bedrest supine x 1 hrs - Hydrocodone PRN  Pain - Follow biopsy results  Signed,  Evangelina Delancey K. Capricia Serda, MD   

## 2020-07-28 NOTE — H&P (Signed)
Chief Complaint: Patient was seen in consultation today for bone marrow biopsy and aspiration  Referring Physician(s): Volanda Napoleon  Supervising Physician: [name]  Patient Status: [facility] - Out-pt  History of Present Illness: Bruce Smith is a 69 y.o. male with a medical history significant for HTN, CAD, MI, cervical spondylosis, and multiple myeloma. He was recently hospitalized in early August 2021 for C5-C6 cervical decompression and discectomy fusion.   He presented to the ED 06/26/20 after falling at home and imaging done at that time identified lytic bone lesions. He is familiar to IR from a scapular lesion biopsy 07/09/20 and pathology was positive for a plasma cell neoplasm consistent with myeloma. His lab work showed a high level of kappa light chains.   Interventional Radiology has been asked to evaluate this patient for an image-guided bone marrow biopsy and aspiration for further work up.   Past Medical History:  Diagnosis Date  . Atherosclerosis   . Coronary artery disease   . Goals of care, counseling/discussion 06/29/2020  . 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 Hyperlipidemia   . Hypertension   . 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 Multiple myeloma ([place]) 07/21/2020  . Myocardial infarction [facility])     Past Surgical History:  Procedure Laterality Date  . ANTERIOR CERVICAL DECOMP/DISCECTOMY FUSION N/A 06/17/2020   Procedure: ANTERIOR CERVICAL DECOMPRESSION/DISCECTOMY FUSION, INTERBODY PROSTHESIS, PLATE/SCREWS CERVICAL FIVE- CERVICAL SIX;  Surgeon: Newman Pies, MD;  Location: [facility];  Service: Neurosurgery;  Laterality: N/A;  ANTERIOR CERVICAL DECOMPRESSION/DISCECTOMY FUSION, INTERBODY PROSTHESIS, PLATE/SCREWS CERVICAL FIVE- CERVICAL SIX  . CARDIAC CATHETERIZATION    . FEMUR IM NAIL Left 07/14/2020   Procedure: OPEN REDUCTION INTERNAL FIXATION (ORIF LEFT FEMORAL NAIL FRACTURE;  Surgeon: Marchia Bond, MD;  Location: [facility];  Service: Orthopedics;  Laterality: Left;    Allergies: Patient has no known allergies.  Medications: Prior to Admission medications   Medication Sig Start Date End Date Taking? Authorizing Provider  acetaminophen (TYLENOL) 500 MG tablet Take 1,000 mg by mouth every 6 (six) hours.   Yes [provider]  aspirin 81 MG EC tablet Take 81 mg by mouth daily. Swallow whole.   Yes [provider]  atorvastatin (LIPITOR) 20 MG tablet Take 20 mg by mouth daily.   Yes [provider]  baclofen (LIORESAL) 10 MG tablet Take 1 tablet (10 mg total) by mouth 3 (three) times daily. As needed for muscle spasm 07/14/20  Yes Merlene Pulling K, PA-C  Coenzyme Q10 (CO Q-10) 200 MG CAPS Take 200 mg by mouth daily.   Yes [provider]  cyclobenzaprine (FLEXERIL) 10 MG tablet Take 1 tablet (10 mg total) by mouth 3 (three) times daily as needed for muscle spasms. 06/18/20  Yes Viona Gilmore D, NP  lisinopril (ZESTRIL) 5 MG tablet Take 7.5 mg by mouth daily. 03/10/20  Yes [provider]  LORazepam (ATIVAN) 0.5 MG tablet Take 1 tablet (0.5 mg total) by mouth every 6 (six) hours as needed for anxiety (nausea). 07/21/20  Yes Volanda Napoleon, MD  metoprolol succinate (TOPROL-XL) 50 MG 24 hr tablet Take 50 mg by mouth daily. 03/10/20  Yes [provider]  morphine (MS CONTIN) 15 MG 12 hr tablet Take 1 tablet (15 mg total) by mouth every 12 (twelve) hours. 07/21/20  Yes Volanda Napoleon, MD  morphine (MSIR) 15 MG tablet Take 1 tablet (15 mg total) by mouth every 6 (six) hours as needed for severe pain. 07/21/20  Yes Volanda Napoleon, MD  ondansetron (ZOFRAN) 8 MG tablet Take 1 tablet (8 mg total) by mouth every 8 (eight) hours as needed for nausea or vomiting. 07/22/20  Yes Gery Pray, MD  Polyethylene Glycol 3350 (MIRALAX PO) Take 2 [place] by mouth daily.   Yes [provider]  Polyethylene Glycol 3350 (MIRALAX PO) Take by mouth as needed.   Yes [provider]  pregabalin (LYRICA) 150 MG capsule Take 150 mg by mouth 2 (two) times daily as needed (Nerve pain).  06/02/20  Yes [provider]  sennosides-docusate sodium (SENOKOT-S) 8.6-50 MG tablet Take 2 tablets by mouth daily. 07/14/20  Yes Merlene Pulling K, PA-C  lenalidomide (REVLIMID) 25 MG capsule Take 1 capsule (25 mg total) by mouth daily. 21 days on, then 7 days off. [employer] Auth # [id number]     Date Obtained 07/23/2020 07/24/20   Volanda Napoleon, MD  nitroGLYCERIN (NITROSTAT) 0.4 MG SL tablet Place 0.4 mg under the tongue every 5 (five) minutes as needed for chest pain.    [provider]  prasugrel (EFFIENT) 10 MG TABS tablet Take 10 mg by mouth daily. 05/12/20   [provider]  rivaroxaban (XARELTO) 10 MG TABS tablet Take 1 tablet (10 mg total) by mouth daily. 07/14/20   Ventura Bruns, PA-C     History reviewed. No pertinent family history.  Social History   Socioeconomic History  . Marital status: Married    Spouse name: Not on file  . Number of children: Not on file  . Years of education: Not on file  . Highest education level: Not on file  Occupational History  . Not on file  Tobacco Use  . Smoking status: Former Smoker    Quit date: 2000    Years since quitting: 21.7  . Smokeless tobacco: Current User    Types: Chew  Vaping Use  . Vaping Use: Never used  Substance and Sexual Activity  . Alcohol use: Not on file    Comment: rare  . Drug use: Never  . Sexual activity: Not on file  Other Topics Concern  . Not on file  Social History Narrative  . Not on file   Social Determinants of Health   Financial Resource Strain:   . Difficulty of Paying Living Expenses: Not on file  Food Insecurity:   . Worried About Charity fundraiser in the Last Year: Not on file  . Ran Out of Food in the Last Year: Not on file  Transportation Needs:   . Lack of Transportation (Medical): Not on file  . Lack of Transportation (Non-Medical): Not on file   Physical Activity:   . Days of Exercise per Week: Not on file  . Minutes of Exercise per Session: Not on file  Stress:   . Feeling of Stress : Not on file  Social Connections:   . Frequency of Communication with Friends and Family: Not on file  . Frequency of Social Gatherings with Friends and Family: Not on file  . Attends Religious Services: Not on file  . Active Member of Clubs or Organizations: Not on file  . Attends Archivist Meetings: Not on file  . Marital Status: Not on file    Review of Systems: A 12 point ROS discussed and pertinent positives are indicated in the HPI above.  All other systems are negative.  Review of Systems  Constitutional: Positive for fatigue. Negative for appetite change.  Respiratory: Negative for cough  and shortness of breath.   Cardiovascular: Negative for chest pain and leg swelling.  Gastrointestinal: Positive for nausea. Negative for diarrhea and vomiting.       Intermittent  Musculoskeletal: Positive for back pain.  Neurological: Negative for headaches.    Vital Signs: BP (!) 156/70 (BP Location: Right Arm)   Pulse 71   Temp 98 F (36.7 C) (Oral)   Resp 17   SpO2 100%   Physical Exam Constitutional:      General: He is not in acute distress. HENT:     Mouth/Throat:     Mouth: Mucous membranes are moist.     Pharynx: Oropharynx is clear.  Cardiovascular:     Rate and Rhythm: Normal rate and regular rhythm.     Pulses: Normal pulses.     Heart sounds: Normal heart sounds.  Pulmonary:     Effort: Pulmonary effort is normal.     Breath sounds: Normal breath sounds.  Abdominal:     General: Bowel sounds are normal.     Palpations: Abdomen is soft.  Skin:    General: Skin is warm and dry.  Neurological:     Mental Status: He is alert and oriented to person, place, and time.     Imaging: DG Pelvis Portable  Result Date: 07/14/2020 CLINICAL DATA:  Open reduction internal fixation EXAM: PORTABLE PELVIS 1-2 VIEWS  COMPARISON:  PET-CT December 08, 2019 FINDINGS: Postoperative screw and rod fixation in proximal left femur. Widespread lytic metastases present, better delineated on recent PET-CT study. No acute fracture or dislocation. There is mild symmetric narrowing of each hip joint. IMPRESSION: Multifocal lytic bone lesions, better delineated on recent PET-CT examination. Postoperative change proximal left femur. No fracture or dislocation. Relatively mild symmetric narrowing of each hip joint noted. Electronically Signed   By: Lowella Grip III M.D.   On: 07/14/2020 15:16   NM PET Image Initial (PI) Skull Base To Thigh  Result Date: 07/07/2020 CLINICAL DATA:  Initial treatment strategy for soft tissue mass in the LEFT scapula. EXAM: NUCLEAR MEDICINE PET SKULL BASE TO THIGH TECHNIQUE: 11.56 mCi F-18 FDG was injected intravenously. Full-ring PET imaging was performed from the skull base to thigh after the radiotracer. CT data was obtained and used for attenuation correction and anatomic localization. Fasting blood glucose: 106 mg/dl COMPARISON:  CT angiography of the chest FINDINGS: Mediastinal blood pool activity: SUV max 2.81 Liver activity: SUV max 4.06 NECK: No hypermetabolic lymph nodes in the neck. Diffuse muscular activity about the longus coli and longus capitis musculature. Incidental CT findings: Scalp lesions on the LEFT overlying the LEFT occipital parietal region showing some calcification largest approximately 10 x 6.2 mm. CHEST: Chest wall involvement, see below under the musculoskeletal section. No sign of adenopathy in the chest Incidental CT findings: Calcified atheromatous plaque and calcified coronary artery disease. Similar to previous imaging. Non-contrast appearance of heart and great vessels with limited assessment due to lack of intravenous contrast. Signs of basilar atelectasis. No effusion. No consolidation. Tiny pulmonary nodule in the RIGHT upper lobe measuring approximately 7 x 3 mm (image  19, series 8) no increased FDG uptake. ABDOMEN/PELVIS: No abnormal hypermetabolic activity within the liver, pancreas, adrenal glands, or spleen. No hypermetabolic lymph nodes in the abdomen or pelvis. Incidental CT findings: Liver with normal size and contour. No pericholecystic stranding. No gross biliary duct dilation. No peripancreatic inflammation. Spleen normal in size and contour. No hydronephrosis. Small LEFT intrarenal calculus in the interpolar LEFT kidney. SKELETON: Multifocal areas  of lytic and destructive changes. LEFT scapular lesion with associated soft tissue and bony destruction as well as pathologic fracture is unchanged with respect to size displaying a maximum SUV of 15.1 (image 64, series 4) Numerous rib lesions are present bilaterally displaying increased FDG uptake. The largest of these is a expansile lesion in the chest wall destroying the LEFT fourth rib and third ribs with lenticular morphology measuring approximately 5.8 x 3.6 cm, size could be altered based on changes and chest wall orientation. Overall similar to the previous study. (SUVmax = 5.8) RIGHT humeral lesions also with increased metabolic activity (image 57, series 4) 3 x 2.3 cm area of increased metabolic activity and destructive change (SUVmax = 5.8, adjacent smaller lesion with increased metabolic activity as well in the greater tuberosity 1.4 cm with a maximum SUV of 9.1) Subtle lytic foci in the proximal LEFT humerus as well with punched-out cortical lesions. RIGHT scapular lesion also seen on image 58, 1.7 cm also with increased metabolic activity. Innumerable other foci of lytic change throughout nearly every visualized level of the thoracic and lumbar spine. Area of greatest involvement in the upper thoracic spine is the T1 vertebral body which was referenced on the previous cervical spine CT displaying early posterior cortical disruption. This area measures approximately 1.8 x 1.4 cm anterior plate and screw fixation  of the cervical spine is just above this level. T10 and T12 also display marked hypermetabolism associated with lytic changes (image 98, series 4) 12 mm area along the lateral cortex of the vertebral body with a maximum SUV of 6.1 T12 lesion on image 113 of series 4 measuring approximately 13-14 mm with a maximum SUV of 11.1. Diffuse activity within L2, associated with a large lytic focus that measures approximately 2.0 x 2.1 cm., marked hypermetabolic focus within the sacrum and multifocal lytic changes within the bilateral iliac bones, largest on the LEFT measuring approximately 2.9 x 1.3 cm All areas of the pelvis are involved to some extent with the iliac bones showing greatest involvement. LEFT femoral neck lesion at risk for pathologic fracture occupying nearly the entire LEFT femoral neck measuring 3.7 x 2.5 cm (image 187, series 4) (SUVmax = 4.9) signs of spinal fusion in the cervical spine. Relative sparing of the cervical spine Incidental CT findings: none IMPRESSION: Multifocal lytic bone lesions, all levels of thoracic and lumbar spine show involvement, lytic changes in the LEFT scapula with destruction and pathologic fracture and destruction of LEFT-sided ribs with dominant chest wall lesion as described. Multifocal involvement also involving the bilateral humeri and the bilateral femora most notably in the LEFT proximal femur where there is a large destructive lesion at risk for pathologic fracture involving nearly the entire LEFT femoral neck. Imaging characteristics are most suggestive of either myeloma or metastatic disease. Of primary neoplasms that could present in this fashion melanoma is the leading consideration. This is in the absence of a lung lesion which would explain these findings. Small RIGHT upper lobe pulmonary nodule measuring 7 x 4 mm is nonspecific, attention on follow-up. No signs of adenopathy or splenic enlargement. These results will be called to the ordering clinician or  representative by the Radiologist Assistant, and communication documented in the PACS or [employer]. Electronically Signed   By: Zetta Bills M.D.   On: 07/07/2020 15:36   CT Biopsy  Result Date: 07/09/2020 INDICATION: 69 year old with multiple lytic bone lesions. Tissue diagnosis is needed. Patient has a left scapular fracture with an expansile lytic lesion.  EXAM: CT-GUIDED BIOPSY OF LEFT SCAPULA BONE LESION MEDICATIONS: None. ANESTHESIA/SEDATION: Moderate (conscious) sedation was employed during this procedure. A total of Versed 2.0 mg and Fentanyl 100 mcg was administered intravenously. Moderate Sedation Time: 14 minutes minutes. The patient's level of consciousness and vital signs were monitored continuously by radiology nursing throughout the procedure under my direct supervision. FLUOROSCOPY TIME:  None COMPLICATIONS: None immediate. PROCEDURE: Informed written consent was obtained from the patient after a thorough discussion of the procedural risks, benefits and alternatives. All questions were addressed. A timeout was performed prior to the initiation of the procedure. Patient was placed in a right lateral decubitus position. CT images through the chest were obtained. The expansile lytic lesion in left scapula was identified and targeted. The overlying skin was prepped with chlorhexidine and sterile field was created. Skin was anesthetized using 1% lidocaine. Small incision was made. Using CT guidance, 17 gauge coaxial needle was directed into the lucent bone lesion. Multiple soft tissue core biopsies were obtained with an 18 gauge core device. Adequate specimens were obtained and placed in formalin. The 17 gauge needle was removed without complication. Bandage placed over the puncture site. FINDINGS: Fracture of the left scapula with an expansile lytic lesion. Biopsy needle was confirmed within the expansile lytic lesion. Multiple soft tissue core biopsies were obtained from the lesion. Again  noted is a large expansile lesion involving the left lateral chest. Additional lucent bone lesions. IMPRESSION: Successful CT-guided core biopsy of the expansile lytic lesion involving the left scapula. Electronically Signed   By: Markus Daft M.D.   On: 07/09/2020 11:32   DG C-Arm 1-60 Min  Result Date: 07/14/2020 CLINICAL DATA:  ORIF of left femur fracture EXAM: LEFT FEMUR 2 VIEWS; DG C-ARM 1-60 MIN COMPARISON:  None. FINDINGS: The patient is status post ORIF with intramedullary rod and screw fixation the femur fracture. Six intraop views were submitted for review. Fluoro time 1 minutes 39 seconds IMPRESSION: Intraop views of ORIF of proximal femur fracture. Electronically Signed   By: Prudencio Pair M.D.   On: 07/14/2020 15:16   DG FEMUR MIN 2 VIEWS LEFT  Result Date: 07/14/2020 CLINICAL DATA:  ORIF of left femur fracture EXAM: LEFT FEMUR 2 VIEWS; DG C-ARM 1-60 MIN COMPARISON:  None. FINDINGS: The patient is status post ORIF with intramedullary rod and screw fixation the femur fracture. Six intraop views were submitted for review. Fluoro time 1 minutes 39 seconds IMPRESSION: Intraop views of ORIF of proximal femur fracture. Electronically Signed   By: Prudencio Pair M.D.   On: 07/14/2020 15:16   DG FEMUR PORT MIN 2 VIEWS LEFT  Result Date: 07/14/2020 CLINICAL DATA:  Status post open reduction and internal fixation for fracture EXAM: LEFT FEMUR PORTABLE 2 VIEWS COMPARISON:  Intraoperative left femur images July 14, 2020. PET-CT July 07, 2020 FINDINGS: Frontal and lateral views obtained. There is screw and rod fixation through the left femoral shaft. Tip of the screw is in the proximal left femur. Note that there is fixation through the lytic lesion in the left lateral femoral neck. Alignment in this area is anatomic. There is mild narrowing of the left hip joint. Moderate narrowing of medial and lateral components of left knee. No fracture or dislocation. There is a lytic lesion in the proximal  fibular diaphysis as well as a lytic lesion in the proximal tibia midline slightly inferior to the tibial spines. IMPRESSION: Postoperative change with screw and rod transfixing lytic lesion in the left femoral neck region. Alignment  anatomic. No acute fracture or dislocation. Mild narrowing left hip joint. Moderate narrowing left knee joint. Note lytic lesion in proximal fibular diaphysis and proximal tibia immediately inferior to the tibial spines. Electronically Signed   By: Lowella Grip III M.D.   On: 07/14/2020 15:14    Labs:  CBC: Recent Labs    06/29/20 1453 07/09/20 0725 07/21/20 1256 07/28/20 0704  WBC 13.6* 9.1 10.7* 11.3*  HGB 13.8 13.3 12.8* 12.9*  HCT 41.0 39.3 38.7* 38.7*  PLT 270 319 363 335    COAGS: Recent Labs    07/09/20 0725  INR 1.1    BMP: Recent Labs    06/26/20 1638 06/29/20 1453 07/09/20 0725 07/21/20 1256  NA 137 137 137 137  K 4.4 4.5 3.6 4.3  CL 102 99 108 102  CO2 '26 31 23 26  ' GLUCOSE 131* 124* 116* 107*  BUN '19 22 14 12  ' CALCIUM 11.1* 12.8* 8.1* 9.0  CREATININE 1.19 1.61* 0.96 1.04  GFRNONAA >60 43* >60 >60  GFRAA >60 50* >60 >60    LIVER FUNCTION TESTS: Recent Labs    06/26/20 1638 06/29/20 1453 07/21/20 1256  BILITOT 0.7 0.7 0.7  AST '22 17 19  ' ALT 39 30 35  ALKPHOS 130* 142* 155*  PROT 6.8 6.4* 6.5  ALBUMIN 3.7 4.0 3.9    TUMOR MARKERS: No results for input(s): AFPTM, CEA, CA199, CHROMGRNA in the last 8760 hours.  Assessment and Plan:  Plasma cell neoplasm; concern for myeloma: Bruce Smith, 69 year old male, presents today to the [facility] department for an image-guided bone marrow biopsy and aspiration.  Risks and benefits of this procedure were discussed with the patient including, but not limited to bleeding, infection, damage to adjacent structures or low yield requiring additional tests.  All of the questions were answered and there is agreement to proceed.  The patient has been  NPO. Labs and vitals have been reviewed. The patient does take Xarelto, last dose was Sunday 07/26/20. .  Consent signed and in chart.  Thank you for this interesting consult.  I greatly enjoyed meeting Bruce Smith and look forward to participating in their care.  A copy of this report was sent to the requesting provider on this date.  Electronically Signed: Soyla Dryer, AGACNP-BC [phone] 07/28/2020, 8:11 AM   I spent a total of  30 Minutes   in face to face in clinical consultation, greater than 50% of which was counseling/coordinating care for bone marrow biopsy and aspiration.

## 2020-07-28 NOTE — Discharge Instructions (Signed)
Needle Biopsy, Care After These instructions tell you how to care for yourself after your procedure. Your doctor may also give you more specific instructions. Call your doctor if you have any problems or questions. What can I expect after the procedure? After the procedure, it is common to have:  Soreness.  Bruising.  Mild pain. Follow these instructions at home:   Return to your normal activities as told by your doctor. Ask your doctor what activities are safe for you.  Take over-the-counter and prescription medicines only as told by your doctor.  Wash your hands with soap and water before you change your bandage (dressing). If you cannot use soap and water, use hand sanitizer.  Follow instructions from your doctor about: ? You may remove your dressing tomorrow and shower.   Check your puncture site every day for signs of infection. Watch for: ? Redness, swelling, or pain. ? Fluid or blood. ? Pus or a bad smell. ? Warmth.  Do not take baths, swim, or use a hot tub until your doctor approves. You may shower tomorrow.  Keep all follow-up visits as told by your doctor. This is important. Contact a doctor if you have:  A fever.  Redness, swelling, or pain at the puncture site, and it lasts longer than a few days.  Fluid, blood, or pus coming from the puncture site.  Warmth coming from the puncture site. Get help right away if:  You have a lot of bleeding from the puncture site. Summary  After the procedure, it is common to have soreness, bruising, or mild pain at the puncture site.  Check your puncture site every day for signs of infection, such as redness, swelling, or pain.  Get help right away if you have severe bleeding from your puncture site. This information is not intended to replace advice given to you by your health care provider. Make sure you discuss any questions you have with your health care provider. Document Revised: 10/30/2017 Document Reviewed:  10/30/2017 Elsevier Patient Education  [place].      Moderate Conscious Sedation, Adult, Care After These instructions provide you with information about caring for yourself after your procedure. Your health care provider may also give you more specific instructions. Your treatment has been planned according to current medical practices, but problems sometimes occur. Call your health care provider if you have any problems or questions after your procedure. What can I expect after the procedure? After your procedure, it is common:  To feel sleepy for several hours.  To feel clumsy and have poor balance for several hours.  To have poor judgment for several hours.  To vomit if you eat too soon. Follow these instructions at home: For at least 24 hours after the procedure:   Do not: ? Participate in activities where you could fall or become injured. ? Drive. ? Use heavy machinery. ? Drink alcohol. ? Take sleeping pills or medicines that cause drowsiness. ? Make important decisions or sign legal documents. ? Take care of children on your own.  Rest. Eating and drinking  Follow the diet recommended by your health care provider.  If you vomit: ? Drink water, juice, or soup when you can drink without vomiting. ? Make sure you have little or no nausea before eating solid foods. General instructions  Have a responsible adult stay with you until you are awake and alert.  Take over-the-counter and prescription medicines only as told by your health care provider.  If you  smoke, do not smoke without supervision.  Keep all follow-up visits as told by your health care provider. This is important. Contact a health care provider if:  You keep feeling nauseous or you keep vomiting.  You feel light-headed.  You develop a rash.  You have a fever. Get help right away if:  You have trouble breathing. This information is not intended to replace advice given to you by  your health care provider. Make sure you discuss any questions you have with your health care provider. Document Revised: 09/29/2017 Document Reviewed: 02/06/2016 Elsevier Patient Education  2020 [employer].

## 2020-07-29 ENCOUNTER — Ambulatory Visit
Admission: RE | Admit: 2020-07-29 | Discharge: 2020-07-29 | Disposition: A | Discharge status: Home / Self Care | Payer: BC Managed Care – PPO | Source: Ambulatory Visit | Attending: Radiation Oncology | Admitting: Radiation Oncology

## 2020-07-29 DIAGNOSIS — C7951 Secondary malignant neoplasm of bone: Secondary | ICD-10-CM | POA: Diagnosis not present

## 2020-07-30 ENCOUNTER — Ambulatory Visit
Admission: RE | Admit: 2020-07-30 | Discharge: 2020-07-30 | Disposition: A | Discharge status: Home / Self Care | Payer: BC Managed Care – PPO | Source: Ambulatory Visit | Attending: Radiation Oncology | Admitting: Radiation Oncology

## 2020-07-30 DIAGNOSIS — C7951 Secondary malignant neoplasm of bone: Secondary | ICD-10-CM | POA: Diagnosis not present

## 2020-07-31 ENCOUNTER — Ambulatory Visit
Admission: RE | Admit: 2020-07-31 | Discharge: 2020-07-31 | Disposition: A | Discharge status: Home / Self Care | Payer: BC Managed Care – PPO | Source: Ambulatory Visit | Attending: Radiation Oncology | Admitting: Radiation Oncology

## 2020-07-31 DIAGNOSIS — C9000 Multiple myeloma not having achieved remission: Secondary | ICD-10-CM | POA: Insufficient documentation

## 2020-07-31 DIAGNOSIS — Z510 Encounter for antineoplastic radiation therapy: Secondary | ICD-10-CM | POA: Insufficient documentation

## 2020-07-31 DIAGNOSIS — C7951 Secondary malignant neoplasm of bone: Secondary | ICD-10-CM | POA: Insufficient documentation

## 2020-08-02 LAB — SURGICAL PATHOLOGY

## 2020-08-03 ENCOUNTER — Ambulatory Visit
Admission: RE | Admit: 2020-08-03 | Discharge: 2020-08-03 | Disposition: A | Discharge status: Home / Self Care | Payer: BC Managed Care – PPO | Source: Ambulatory Visit | Attending: Radiation Oncology | Admitting: Radiation Oncology

## 2020-08-03 DIAGNOSIS — C7951 Secondary malignant neoplasm of bone: Secondary | ICD-10-CM | POA: Diagnosis not present

## 2020-08-04 ENCOUNTER — Encounter: Payer: Self-pay | Admitting: *Deleted

## 2020-08-04 ENCOUNTER — Ambulatory Visit
Admission: RE | Admit: 2020-08-04 | Discharge: 2020-08-04 | Disposition: A | Discharge status: Home / Self Care | Payer: BC Managed Care – PPO | Source: Ambulatory Visit | Attending: Radiation Oncology | Admitting: Radiation Oncology

## 2020-08-04 DIAGNOSIS — C7951 Secondary malignant neoplasm of bone: Secondary | ICD-10-CM | POA: Diagnosis not present

## 2020-08-04 NOTE — Progress Notes (Signed)
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

## 2020-08-04 NOTE — Progress Notes (Signed)
Pharmacist Chemotherapy Monitoring - Initial Assessment    Anticipated start date: 08/11/20  Regimen:  . Are orders appropriate based on the patient's diagnosis, regimen, and cycle? Yes . Does the plan date match the patient's scheduled date? Yes . Is the sequencing of drugs appropriate? Yes . Are the premedications appropriate for the patient's regimen? Yes . Prior Authorization for treatment is: Approved o If applicable, is the correct biosimilar selected based on the patient's insurance? not applicable  Organ Function and Labs: Marland Kitchen Are dose adjustments needed based on the patient's renal function, hepatic function, or hematologic function? No . Are appropriate labs ordered prior to the start of patient's treatment? Yes . Other organ system assessment, if indicated: N/A . The following baseline labs, if indicated, have been ordered: N/A  Dose Assessment: . Are the drug doses appropriate? Yes . Are the following correct: o Drug concentrations Yes o IV fluid compatible with drug Yes o Administration routes Yes o Timing of therapy Yes . If applicable, does the patient have documented access for treatment and/or plans for port-a-cath placement? not applicable . If applicable, have lifetime cumulative doses been properly documented and assessed? not applicable Lifetime Dose Tracking  No doses have been documented on this patient for the following tracked chemicals: Doxorubicin, Epirubicin, Idarubicin, Daunorubicin, Mitoxantrone, Bleomycin, Oxaliplatin, Carboplatin, Liposomal Doxorubicin  o   Toxicity Monitoring/Prevention: . The patient has the following take home antiemetics prescribed: Ondansetron, Prochlorperazine, Dexamethasone and Lorazepam . The patient has the following take home medications prescribed: VZV prophylaxis . Medication allergies and previous infusion related reactions, if applicable, have been reviewed and addressed. Yes . The patient's current medication list has  been assessed for drug-drug interactions with their chemotherapy regimen. no significant drug-drug interactions were identified on review.  Order Review: . Are the treatment plan orders signed? No . Is the patient scheduled to see a provider prior to their treatment? No  I verify that I have reviewed each item in the above checklist and answered each question accordingly.  Hailly Fess, Jacqlyn Larsen 08/04/2020 3:15 PM

## 2020-08-05 ENCOUNTER — Encounter: Payer: Self-pay | Admitting: *Deleted

## 2020-08-05 ENCOUNTER — Ambulatory Visit
Admission: RE | Admit: 2020-08-05 | Discharge: 2020-08-05 | Disposition: A | Discharge status: Home / Self Care | Payer: BC Managed Care – PPO | Source: Ambulatory Visit | Attending: Radiation Oncology | Admitting: Radiation Oncology

## 2020-08-05 DIAGNOSIS — C7951 Secondary malignant neoplasm of bone: Secondary | ICD-10-CM | POA: Diagnosis not present

## 2020-08-05 NOTE — Progress Notes (Signed)
Patient's wife, Maudie Mercury, called and asked when Miguelangel should start the Revlimid they now have in hand. He starts his treatment next Tuesday. Instructed them to bring the Revlimid with them, and if labs and assessment show patient acceptable to start treatment, he can begin with his Revlimid that day as well. Discussed taking the Revlimid at the similar time each day, and the possible benefit to taking it at night to help if it causes nausea.  Maudie Mercury also had some questions about patient's bone marrow results. Explained that the results so far didn't show anything unexpected, but that we were still waiting for cytogenetics to return, which would help to understand prognostics. Explained in general the impact of cytogenetic results. Maudie Mercury knows that we will review bone marrow results in full when all testing returns.    Oncology Nurse Navigator Documentation  Oncology Nurse Navigator Flowsheets 08/05/2020  Abnormal Finding Date -  Confirmed Diagnosis Date -  Diagnosis Status -  Planned Course of Treatment -  Phase of Treatment -  Radiation Actual Start Date: -  Radiation Expected End Date: -  Navigator Follow Up Date: 08/11/2020  Navigator Follow Up Reason: Chemo Class;Chemotherapy  Production assistant, radio Encounter Type Telephone  Telephone Incoming Call;Medication Assistance;Diagnostic Results  Patient Visit Type MedOnc  Treatment Phase Active Tx  Barriers/Navigation Needs Coordination of Care;Education  Education Other  Interventions Education;Psycho-Social Support  Acuity Level 2-Minimal Needs (1-2 Barriers Identified)  Referrals -  Coordination of Care -  Education Method Verbal  Support Groups/Services Friends and Family  Time Spent with Patient 30

## 2020-08-06 ENCOUNTER — Ambulatory Visit
Admission: RE | Admit: 2020-08-06 | Discharge: 2020-08-06 | Disposition: A | Discharge status: Home / Self Care | Payer: BC Managed Care – PPO | Source: Ambulatory Visit | Attending: Radiation Oncology | Admitting: Radiation Oncology

## 2020-08-06 DIAGNOSIS — C7951 Secondary malignant neoplasm of bone: Secondary | ICD-10-CM | POA: Diagnosis not present

## 2020-08-07 ENCOUNTER — Ambulatory Visit
Admission: RE | Admit: 2020-08-07 | Discharge: 2020-08-07 | Disposition: A | Discharge status: Home / Self Care | Payer: BC Managed Care – PPO | Source: Ambulatory Visit | Attending: Radiation Oncology | Admitting: Radiation Oncology

## 2020-08-07 DIAGNOSIS — C7951 Secondary malignant neoplasm of bone: Secondary | ICD-10-CM | POA: Diagnosis not present

## 2020-08-10 ENCOUNTER — Encounter: Payer: Self-pay | Admitting: Radiation Oncology

## 2020-08-10 ENCOUNTER — Other Ambulatory Visit: Payer: Self-pay | Admitting: *Deleted

## 2020-08-10 ENCOUNTER — Ambulatory Visit
Admission: RE | Admit: 2020-08-10 | Discharge: 2020-08-10 | Disposition: A | Discharge status: Home / Self Care | Payer: BC Managed Care – PPO | Source: Ambulatory Visit | Attending: Radiation Oncology | Admitting: Radiation Oncology

## 2020-08-10 DIAGNOSIS — C7951 Secondary malignant neoplasm of bone: Secondary | ICD-10-CM | POA: Diagnosis not present

## 2020-08-11 ENCOUNTER — Inpatient Hospital Stay (HOSPITAL_BASED_OUTPATIENT_CLINIC_OR_DEPARTMENT_OTHER): Payer: BC Managed Care – PPO | Admitting: Hematology & Oncology

## 2020-08-11 ENCOUNTER — Inpatient Hospital Stay: Payer: BC Managed Care – PPO

## 2020-08-11 ENCOUNTER — Encounter (HOSPITAL_COMMUNITY): Payer: Self-pay | Admitting: Hematology & Oncology

## 2020-08-11 ENCOUNTER — Other Ambulatory Visit: Payer: BC Managed Care – PPO

## 2020-08-11 ENCOUNTER — Encounter: Payer: Self-pay | Admitting: *Deleted

## 2020-08-11 ENCOUNTER — Other Ambulatory Visit: Payer: Self-pay | Admitting: Hematology & Oncology

## 2020-08-11 ENCOUNTER — Other Ambulatory Visit: Payer: Self-pay | Admitting: *Deleted

## 2020-08-11 ENCOUNTER — Inpatient Hospital Stay: Payer: BC Managed Care – PPO | Attending: Hematology & Oncology

## 2020-08-11 ENCOUNTER — Inpatient Hospital Stay: Payer: BC Managed Care – PPO | Admitting: Nutrition

## 2020-08-11 ENCOUNTER — Other Ambulatory Visit: Payer: Self-pay

## 2020-08-11 VITALS — BP 116/57 | HR 62 | Temp 98.5°F | Resp 17

## 2020-08-11 DIAGNOSIS — C419 Malignant neoplasm of bone and articular cartilage, unspecified: Secondary | ICD-10-CM

## 2020-08-11 DIAGNOSIS — R110 Nausea: Secondary | ICD-10-CM | POA: Diagnosis not present

## 2020-08-11 DIAGNOSIS — Z5112 Encounter for antineoplastic immunotherapy: Secondary | ICD-10-CM | POA: Insufficient documentation

## 2020-08-11 DIAGNOSIS — C9000 Multiple myeloma not having achieved remission: Secondary | ICD-10-CM

## 2020-08-11 DIAGNOSIS — Z9221 Personal history of antineoplastic chemotherapy: Secondary | ICD-10-CM | POA: Diagnosis not present

## 2020-08-11 DIAGNOSIS — Z7982 Long term (current) use of aspirin: Secondary | ICD-10-CM | POA: Diagnosis not present

## 2020-08-11 DIAGNOSIS — Z79899 Other long term (current) drug therapy: Secondary | ICD-10-CM | POA: Insufficient documentation

## 2020-08-11 DIAGNOSIS — Z923 Personal history of irradiation: Secondary | ICD-10-CM | POA: Insufficient documentation

## 2020-08-11 LAB — CBC WITH DIFFERENTIAL (CANCER CENTER ONLY)
Abs Immature Granulocytes: 0.03 10*3/uL (ref 0.00–0.07)
Basophils Absolute: 0 10*3/uL (ref 0.0–0.1)
Basophils Relative: 1 %
Eosinophils Absolute: 0.4 10*3/uL (ref 0.0–0.5)
Eosinophils Relative: 10 %
HCT: 35.9 % — ABNORMAL LOW (ref 39.0–52.0)
Hemoglobin: 12.2 g/dL — ABNORMAL LOW (ref 13.0–17.0)
Immature Granulocytes: 1 %
Lymphocytes Relative: 9 %
Lymphs Abs: 0.3 10*3/uL — ABNORMAL LOW (ref 0.7–4.0)
MCH: 29.5 pg (ref 26.0–34.0)
MCHC: 34 g/dL (ref 30.0–36.0)
MCV: 86.9 fL (ref 80.0–100.0)
Monocytes Absolute: 0.5 10*3/uL (ref 0.1–1.0)
Monocytes Relative: 14 %
Neutro Abs: 2.2 10*3/uL (ref 1.7–7.7)
Neutrophils Relative %: 65 %
Platelet Count: 180 10*3/uL (ref 150–400)
RBC: 4.13 MIL/uL — ABNORMAL LOW (ref 4.22–5.81)
RDW: 13.8 % (ref 11.5–15.5)
WBC Count: 3.4 10*3/uL — ABNORMAL LOW (ref 4.0–10.5)
nRBC: 0 % (ref 0.0–0.2)

## 2020-08-11 LAB — CMP (CANCER CENTER ONLY)
ALT: 20 U/L (ref 0–44)
AST: 12 U/L — ABNORMAL LOW (ref 15–41)
Albumin: 3.8 g/dL (ref 3.5–5.0)
Alkaline Phosphatase: 121 U/L (ref 38–126)
Anion gap: 8 (ref 5–15)
BUN: 14 mg/dL (ref 8–23)
CO2: 22 mmol/L (ref 22–32)
Calcium: 8.3 mg/dL — ABNORMAL LOW (ref 8.9–10.3)
Chloride: 106 mmol/L (ref 98–111)
Creatinine: 0.86 mg/dL (ref 0.61–1.24)
GFR, Estimated: >60 mL/min (ref >60)
Glucose, Bld: 135 mg/dL — ABNORMAL HIGH (ref 70–99)
Potassium: 4.1 mmol/L (ref 3.5–5.1)
Sodium: 136 mmol/L (ref 135–145)
Total Bilirubin: 0.6 mg/dL (ref 0.3–1.2)
Total Protein: 5.5 g/dL — ABNORMAL LOW (ref 6.5–8.1)

## 2020-08-11 LAB — TYPE AND SCREEN
ABO/RH(D): O POS
Antibody Screen: NEGATIVE

## 2020-08-11 LAB — PRETREATMENT RBC PHENOTYPE

## 2020-08-11 MED ORDER — PROCHLORPERAZINE MALEATE 10 MG PO TABS
ORAL_TABLET | ORAL | Status: AC
  Filled 2020-08-11: qty 1

## 2020-08-11 MED ORDER — OLANZAPINE 10 MG PO TABS: 10.0000 mg | ORAL_TABLET | Freq: Every day | ORAL | 2 refills | Status: DC

## 2020-08-11 MED ORDER — ACETAMINOPHEN 325 MG PO TABS
650.0000 mg | ORAL_TABLET | Freq: Once | ORAL | Status: AC
  Administered 2020-08-11: 650 mg via ORAL

## 2020-08-11 MED ORDER — DEXAMETHASONE 4 MG PO TABS
ORAL_TABLET | ORAL | Status: AC
  Filled 2020-08-11: qty 5

## 2020-08-11 MED ORDER — ACETAMINOPHEN 325 MG PO TABS
ORAL_TABLET | ORAL | Status: AC
  Filled 2020-08-11: qty 2

## 2020-08-11 MED ORDER — BORTEZOMIB CHEMO SQ INJECTION 3.5 MG (2.5MG/ML)
1.3000 mg/m2 | Freq: Once | INTRAMUSCULAR | Status: AC
  Administered 2020-08-11: 2.75 mg via SUBCUTANEOUS
  Filled 2020-08-11: qty 1.1

## 2020-08-11 MED ORDER — ONDANSETRON HCL 8 MG PO TABS
8.0000 mg | ORAL_TABLET | Freq: Once | ORAL | Status: AC
  Administered 2020-08-11: 8 mg via ORAL

## 2020-08-11 MED ORDER — DIPHENHYDRAMINE HCL 25 MG PO CAPS
50.0000 mg | ORAL_CAPSULE | Freq: Once | ORAL | Status: AC
  Administered 2020-08-11: 50 mg via ORAL

## 2020-08-11 MED ORDER — DIPHENHYDRAMINE HCL 25 MG PO CAPS
ORAL_CAPSULE | ORAL | Status: AC
  Filled 2020-08-11: qty 2

## 2020-08-11 MED ORDER — DENOSUMAB 120 MG/1.7ML ~~LOC~~ SOLN
120.0000 mg | Freq: Once | SUBCUTANEOUS | Status: AC
  Administered 2020-08-11: 120 mg via SUBCUTANEOUS

## 2020-08-11 MED ORDER — DEXAMETHASONE 4 MG PO TABS
20.0000 mg | ORAL_TABLET | Freq: Once | ORAL | Status: AC
  Administered 2020-08-11: 20 mg via ORAL

## 2020-08-11 MED ORDER — DEXAMETHASONE 4 MG PO TABS: 4.0000 mg | ORAL_TABLET | Freq: Every day | ORAL | 2 refills | Status: DC

## 2020-08-11 MED ORDER — DARATUMUMAB-HYALURONIDASE-FIHJ 1800-30000 MG-UT/15ML ~~LOC~~ SOLN
1800.0000 mg | Freq: Once | SUBCUTANEOUS | Status: AC
  Administered 2020-08-11: 1800 mg via SUBCUTANEOUS
  Filled 2020-08-11: qty 15

## 2020-08-11 MED ORDER — ONDANSETRON HCL 8 MG PO TABS
ORAL_TABLET | ORAL | Status: AC
  Filled 2020-08-11: qty 1

## 2020-08-11 MED ORDER — PROCHLORPERAZINE MALEATE 10 MG PO TABS
10.0000 mg | ORAL_TABLET | Freq: Once | ORAL | Status: AC
  Administered 2020-08-11: 10 mg via ORAL

## 2020-08-11 MED ORDER — DENOSUMAB 120 MG/1.7ML ~~LOC~~ SOLN
SUBCUTANEOUS | Status: AC
  Filled 2020-08-11: qty 1.7

## 2020-08-11 MED ORDER — MONTELUKAST SODIUM 10 MG PO TABS
10.0000 mg | ORAL_TABLET | Freq: Once | ORAL | Status: AC
  Administered 2020-08-11: 10 mg via ORAL
  Filled 2020-08-11: qty 1

## 2020-08-11 MED ORDER — FAMCICLOVIR 500 MG PO TABS: 500.0000 mg | ORAL_TABLET | Freq: Every day | ORAL | 8 refills | Status: DC

## 2020-08-11 MED ORDER — DRONABINOL 5 MG PO CAPS: 5.0000 mg | ORAL_CAPSULE | Freq: Two times a day (BID) | ORAL | 0 refills | Status: DC

## 2020-08-11 NOTE — Progress Notes (Signed)
Hematology and Oncology Follow Up Visit  Demorio Seeley [id number] July 22, 1951 69 y.o. 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 Dise is back for follow-up.  His main problem has been profound nausea.  He has had this ever since radiation.  I am little bit surprised by this.  He has been on a fairly aggressive antiemetic regimen.  This does not seem to be working.  He is lost some weight.  I will try him on some Zyprexa (10 mg p.o. nightly) and Marinol (5 mg p.o. twice daily).  He has better movement of the left shoulder.  Nothing radiation is clearly help this.  He did have his bone marrow biopsy done on 07/28/2020.  The pathology report ([id number]) showed plasma cell neoplasm.  This involved about 30-40% of his bone marrow.  The cytogenetics came back normal.  However, the FISH panel showed multiple chromosomal abnormalities.  Thankfully, he does not have the 17p-abnormality.  We really have to get him on treatment now.  Thankfully, we can put him on a all subcutaneous regimen.  This will make it a lot easier for him.  He also will be on Revlimid.  He definitely is headed toward transplant.  I really need to work on getting his nausea better.  Surprisingly, on a 24-hour urine, he really is not putting out a lot of Kappa light chain.  He was only putting out 19.52m/L.   Currently, his performance status is ECOG 1.  Medications:  Current Outpatient Medications:  .  acetaminophen (TYLENOL) 500 MG tablet, Take 1,000 mg by mouth every 6 (six) hours., Disp: , Rfl:  .  aspirin 81 MG EC tablet, Take 81 mg  by mouth daily. Swallow whole., Disp: , Rfl:  .  atorvastatin (LIPITOR) 20 MG tablet, Take 20 mg by mouth daily., Disp: , Rfl:  .  baclofen (LIORESAL) 10 MG tablet, Take 1 tablet (10 mg total) by mouth 3 (three) times daily. As needed for muscle spasm, Disp: 50 tablet, Rfl: 0 .  Coenzyme Q10 (CO Q-10) 200 MG CAPS, Take 200 mg by mouth daily., Disp: , Rfl:  .  cyclobenzaprine (FLEXERIL) 10 MG tablet, Take 1 tablet (10 mg total) by mouth 3 (three) times daily as needed for muscle spasms., Disp: 30 tablet, Rfl: 0 .  lenalidomide (REVLIMID) 25 MG capsule, Take 1 capsule (25 mg total) by mouth daily. 21 days on, then 7 days off. [employer] Auth # [id number]    Date Obtained 07/23/2020, Disp: 21 capsule, Rfl: 0 .  lisinopril (ZESTRIL) 5 MG tablet, Take 7.5 mg by mouth daily., Disp: , Rfl:  .  LORazepam (ATIVAN) 0.5 MG tablet, Take 1 tablet (0.5 mg total) by mouth every 6 (six) hours as needed for anxiety (nausea)., Disp: 40 tablet, Rfl: 0 .  metoprolol succinate (TOPROL-XL) 50 MG 24 hr tablet, Take 50 mg by mouth daily., Disp: , Rfl:  .  morphine (MS CONTIN) 15 MG 12 hr tablet, Take 1 tablet (15 mg total) by mouth every 12 (twelve) hours., Disp: 60 tablet, Rfl: 0 .  morphine (MSIR) 15 MG tablet, Take 1 tablet (15  mg total) by mouth every 6 (six) hours as needed for severe pain., Disp: 90 tablet, Rfl: 0 .  nitroGLYCERIN (NITROSTAT) 0.4 MG SL tablet, Place 0.4 mg under the tongue every 5 (five) minutes as needed for chest pain., Disp: , Rfl:  .  ondansetron (ZOFRAN) 8 MG tablet, Take 1 tablet (8 mg total) by mouth every 8 (eight) hours as needed for nausea or vomiting., Disp: 20 tablet, Rfl: 1 .  Polyethylene Glycol 3350 (MIRALAX PO), Take 2 Scoops by mouth daily., Disp: , Rfl:  .  Polyethylene Glycol 3350 (MIRALAX PO), Take by mouth as needed., Disp: , Rfl:  .  prasugrel (EFFIENT) 10 MG TABS tablet, Take 10 mg by mouth daily., Disp: , Rfl:  .  pregabalin (LYRICA) 150 MG capsule, Take 150 mg by mouth 2 (two) times  daily as needed (Nerve pain). , Disp: , Rfl:  .  prochlorperazine (COMPAZINE) 10 MG tablet, Take 1 tablet (10 mg total) by mouth every 6 (six) hours as needed for nausea or vomiting., Disp: 10 tablet, Rfl: 0 .  rivaroxaban (XARELTO) 10 MG TABS tablet, Take 1 tablet (10 mg total) by mouth daily., Disp: 30 tablet, Rfl: 0 .  sennosides-docusate sodium (SENOKOT-S) 8.6-50 MG tablet, Take 2 tablets by mouth daily., Disp: 30 tablet, Rfl: 1 No current facility-administered medications for this visit.  Facility-Administered Medications Ordered in Other Visits:  .  bortezomib SQ (VELCADE) chemo injection (2.58m/mL concentration) 2.75 mg, 1.3 mg/m2 (Treatment Plan Recorded), Subcutaneous, Once, Morgen Linebaugh, PRudell Cobb MD .  daratumumab-hyaluronidase-fihj (DARZALEX FASPRO) 1800-30000 MG-UT/15ML chemo SQ injection 1,800 mg, 1,800 mg, Subcutaneous, Once, Jeremie Abdelaziz, PRudell Cobb MD .  denosumab (XGEVA) injection 120 mg, 120 mg, Subcutaneous, Once, Ariv Penrod, PRudell Cobb MD  Allergies: No Known Allergies  Past Medical History, Surgical history, Social history, and Family History were reviewed and updated.  Review of Systems: Review of Systems  Constitutional: Negative.   HENT:  Negative.   Eyes: Negative.   Respiratory: Negative.   Cardiovascular: Negative.   Gastrointestinal: Negative.   Endocrine: Negative.   Genitourinary: Negative.    Musculoskeletal: Positive for arthralgias.  Skin: Negative.   Neurological: Negative.   Hematological: Negative.   Psychiatric/Behavioral: Negative.     Physical Exam:  vitals were not taken for this visit.   Wt Readings from Last 3 Encounters:  07/22/20 209 lb 8 oz (95 kg)  07/21/20 214 lb (97.1 kg)  07/14/20 217 lb (98.4 kg)    Physical Exam Vitals reviewed.  HENT:     Head: Normocephalic and atraumatic.  Eyes:     Pupils: Pupils are equal, round, and reactive to light.  Cardiovascular:     Rate and Rhythm: Normal rate and regular rhythm.     Heart sounds: Normal  heart sounds.  Pulmonary:     Effort: Pulmonary effort is normal.     Breath sounds: Normal breath sounds.  Abdominal:     General: Bowel sounds are normal.     Palpations: Abdomen is soft.  Musculoskeletal:        General: No tenderness or deformity. Normal range of motion.     Cervical back: Normal range of motion.     Comments: He has the left hip surgical scar that is healed.  He has some decreased range of motion of the left hip area.  He has some discomfort in the left shoulder blade.  He does seem to have little bit of better range of motion in this area.  Lymphadenopathy:  Cervical: No cervical adenopathy.  Skin:    General: Skin is warm and dry.     Findings: No erythema or rash.  Neurological:     Mental Status: He is alert and oriented to person, place, and time.  Psychiatric:        Behavior: Behavior normal.        Thought Content: Thought content normal.        Judgment: Judgment normal.    Lab Results  Component Value Date   WBC 3.4 (L) 08/11/2020   HGB 12.2 (L) 08/11/2020   HCT 35.9 (L) 08/11/2020   MCV 86.9 08/11/2020   PLT 180 08/11/2020     Chemistry      Component Value Date/Time   NA 136 08/11/2020 0822   K 4.1 08/11/2020 0822   CL 106 08/11/2020 0822   CO2 22 08/11/2020 0822   BUN 14 08/11/2020 0822   CREATININE 0.86 08/11/2020 0822      Component Value Date/Time   CALCIUM 8.3 (L) 08/11/2020 0822   ALKPHOS 121 08/11/2020 0822   AST 12 (L) 08/11/2020 0822   ALT 20 08/11/2020 0822   BILITOT 0.6 08/11/2020 9924      Impression and Plan: Mr. Pelaez is a very nice 69 year old white male.  He has kappa light chain myeloma.  I was supposed not to surprised by this as light chain myeloma does tend to involve the bones.  He has had radiation therapy.  Hopefully this will continue to help him.  We will start on chemotherapy today.  I do believe that the Faspro/Velcade/Revlimid combination should do a good job in getting him into a very good  partial remission or maybe even a remission.  I am just a little surprised that he does not have as much light chain as I thought in his urine.  The cytogenetic abnormalities on FISH panel are little bit troublesome.  We definitely have to get him to a transplant.  We will have to follow him closely.  He will get weekly Faspro along with the Velcade.  Velcade will actually 3 weeks on and 1 week off.  I will plan to get him back in 1 month.  We will see how he is doing at that point.    He is on both baby aspirin and low-dose Xarelto.  I suspect this is part of her cardiac issues.  This also will help with respect to blood clots with the Revlimid.  We do need to get him on Famvir.  I will put him on 500 mg daily of Famvir.   Volanda Napoleon, MD 10/12/202111:02 AM

## 2020-08-11 NOTE — Patient Instructions (Signed)
[facility] Discharge Instructions for Patients Receiving Chemotherapy  Today you received the following chemotherapy agents Darzalex (Faspro), Velcade  To help prevent nausea and vomiting after your treatment, we encourage you to take your nausea medication   1) Begin Dexamethasone (Decadron) tomorrow (Wednesday October 13). Take once daily for 3 days after today. 2) Take Olanzapine 10 mg every pm for nausea 3) Begin taking Marinol 5 mg twice daily for appetite and nausea.  4) Can continue taking Zofran 8 mg twice daily as needed for nausea.       If you develop nausea and vomiting that is not controlled by your nausea medication, call the clinic.   BELOW ARE SYMPTOMS THAT SHOULD BE REPORTED IMMEDIATELY:  *FEVER GREATER THAN 100.5 F  *CHILLS WITH OR WITHOUT FEVER  NAUSEA AND VOMITING THAT IS NOT CONTROLLED WITH YOUR NAUSEA MEDICATION  *UNUSUAL SHORTNESS OF BREATH  *UNUSUAL BRUISING OR BLEEDING  TENDERNESS IN MOUTH AND THROAT WITH OR WITHOUT PRESENCE OF ULCERS  *URINARY PROBLEMS  *BOWEL PROBLEMS  UNUSUAL RASH Items with * indicate a potential emergency and should be followed up as soon as possible.  Feel free to call the clinic should you have any questions or concerns. The clinic phone number is [phone].  Please show the CHEMO ALERT CARD at check-in to the Emergency Department and triage nurse.   

## 2020-08-11 NOTE — Progress Notes (Signed)
Patient here to initiate systemic treatment. He's still c/o continuous nausea despite the medications we have prescribed. He has lost weight. He is already scheduled for a nutrition consult today, as well as chemo education.   Dr [name] has received his bone marrow cytogenetics back and will add him to his schedule to talk to him and his wife. He will also address some of the issues with nausea.   Will call patient tomorrow to see how he is feeling after his first treatment.   Oncology Nurse Navigator Documentation  Oncology Nurse Navigator Flowsheets 08/11/2020  Abnormal Finding Date -  Confirmed Diagnosis Date -  Diagnosis Status -  Planned Course of Treatment -  Phase of Treatment Radiation  Radiation Actual Start Date: -  Radiation Expected End Date: -  Radiation Actual End Date: 08/10/2020  Navigator Follow Up Date: 09/09/2020  Navigator Follow Up Reason: Follow-up Appointment;Other:  Production assistant, radio Encounter Type Treatment  Telephone -  Patient Visit Type MedOnc  Treatment Phase First Chemo Tx  Barriers/Navigation Needs Coordination of Care;Education;Family Concerns  Education Other  Interventions Education;Psycho-Social Support  Acuity Level 2-Minimal Needs (1-2 Barriers Identified)  Referrals -  Coordination of Care -  Education Method Verbal  Support Groups/Services Friends and Family  Time Spent with Patient 93

## 2020-08-11 NOTE — Progress Notes (Signed)
69 year old male diagnosed with Multiple Myeloma. He is a patient of Dr. [name] receiving Revlimid.  PMH includes CAD, HLD, HTN, and MI.  Medications include CoQ10, Ativan, Zofran, Decadron, Marinol, Compazine, Miralax and Senekot.  Labs include Glucose 107.  Height: 5'8" Weight: 209 pounds on Sept 22. UBW: 232 pounds on August 2021. BMI: 31.85. ECOG: 2  Patient reports almost continuous nausea not controlled with zofran. He has not been taking prescription as written. MD added compazine, decadron and marinol today. He has tried some natural remedies for nausea such as ginger and aromatherapy with poor results. Patient has had weight loss and reports recent home weight of 201#.  Nutrition Diagnosis: Unintended weight loss related to multiple myeloma and associated treatments as evidenced by 10% weight loss in one month.  Intervention: Educated patient to take all medication as prescribed. RN reviewed exact timing of taking both zofran and compazine. Patient reported good understanding. Educated patient on strategies for eating with nausea Consume small frequent meals and snacks with increased calories and protein. Try oral nutrition supplements between meals. Coupons provided. Fact sheets given. Questions answered.  Monitoring, Evaluation, Goals: Patient will tolerate increased calories and protein for weight maintenance.  Next Visit: To be scheduled as needed.

## 2020-08-11 NOTE — Progress Notes (Signed)
Patient in chemotherapy education class with wife.  Discussed side effects of Daratumumab (Faspro), Velcade and Revlimid which include but are not limited to myelosuppression, decreased appetite, fatigue, fever, allergic or infusional reaction, mucositis, cardiac toxicity, cough, SOB, altered taste, nausea and vomiting, diarrhea, constipation, elevated LFTs myalgia and arthralgias, hair loss or thinning, rash, skin dryness, nail changes, peripheral neuropathy, discolored urine, delayed wound healing, mental changes (Chemo brain), increased risk of infections, weight loss. Patient having considerable nausea due to side effects of radiation.  Dr [name] consulted.  Added Olanzapine and Marinol to antiemetic profile.  Reviewed infusion room and office policy and procedure and phone numbers 24 hours x 7 days a week.    Reviewed when to call the office with any concerns or problems.  Scientist, clinical (histocompatibility and immunogenetics) given.  Antiemetic protocol and chemotherapy schedule reviewed. Patient verbalized understanding of chemotherapy indications and possible side effects.  Teachback done

## 2020-08-12 ENCOUNTER — Telehealth: Payer: Self-pay | Admitting: *Deleted

## 2020-08-12 ENCOUNTER — Encounter: Payer: Self-pay | Admitting: *Deleted

## 2020-08-12 LAB — KAPPA/LAMBDA LIGHT CHAINS
Kappa free light chain: 72.9 mg/L — ABNORMAL HIGH (ref 3.3–19.4)
Kappa, lambda light chain ratio: 15.51 — ABNORMAL HIGH (ref 0.26–1.65)
Lambda free light chains: 4.7 mg/L — ABNORMAL LOW (ref 5.7–26.3)

## 2020-08-12 LAB — IGG, IGA, IGM
IgA: 114 mg/dL (ref 61–437)
IgG (Immunoglobin G), Serum: 475 mg/dL — ABNORMAL LOW (ref 603–1613)
IgM (Immunoglobulin M), Srm: 8 mg/dL — ABNORMAL LOW (ref 20–172)

## 2020-08-12 NOTE — Progress Notes (Signed)
MD nurse has already called to check on patient today without any issues. Will follow up with patient when he's back in the office next week.  Oncology Nurse Navigator Documentation  Oncology Nurse Navigator Flowsheets 08/12/2020  Abnormal Finding Date -  Confirmed Diagnosis Date -  Diagnosis Status -  Planned Course of Treatment -  Phase of Treatment -  Chemotherapy Actual Start Date: -  Radiation Actual Start Date: -  Radiation Expected End Date: -  Radiation Actual End Date: -  Navigator Follow Up Date: 09/09/2020  Navigator Follow Up Reason: Follow-up Appointment  Navigator Location [facility]  Navigator Encounter Type Appt/Treatment Plan Review  Telephone -  Patient Visit Type MedOnc  Treatment Phase Active Tx  Barriers/Navigation Needs Coordination of Care;Education;Family Concerns  Education -  Interventions None Required  Acuity Level 2-Minimal Needs (1-2 Barriers Identified)  Referrals -  Coordination of Care -  Education Method -  Support Groups/Services Friends and Family  Time Spent with Patient 15

## 2020-08-12 NOTE — Telephone Encounter (Signed)
Talked with patient and his wife following up after chemotherapy yesterday.  Patient feeling well today.  Actually nausea is improved today.  Reviewed patients nausea medication with wife Reinforced with patient and wife to not hesitate to reach out if they need anything.

## 2020-08-17 ENCOUNTER — Other Ambulatory Visit: Payer: Self-pay | Admitting: *Deleted

## 2020-08-17 DIAGNOSIS — C9000 Multiple myeloma not having achieved remission: Secondary | ICD-10-CM

## 2020-08-17 DIAGNOSIS — C419 Malignant neoplasm of bone and articular cartilage, unspecified: Secondary | ICD-10-CM

## 2020-08-18 ENCOUNTER — Inpatient Hospital Stay: Payer: BC Managed Care – PPO

## 2020-08-18 ENCOUNTER — Other Ambulatory Visit: Payer: Self-pay | Admitting: *Deleted

## 2020-08-18 ENCOUNTER — Other Ambulatory Visit: Payer: Self-pay

## 2020-08-18 VITALS — BP 113/38 | HR 66 | Temp 98.3°F | Resp 18

## 2020-08-18 DIAGNOSIS — C9000 Multiple myeloma not having achieved remission: Secondary | ICD-10-CM

## 2020-08-18 DIAGNOSIS — C419 Malignant neoplasm of bone and articular cartilage, unspecified: Secondary | ICD-10-CM

## 2020-08-18 LAB — CBC WITH DIFFERENTIAL (CANCER CENTER ONLY)
Abs Immature Granulocytes: 0.01 10*3/uL (ref 0.00–0.07)
Basophils Absolute: 0 10*3/uL (ref 0.0–0.1)
Basophils Relative: 0 %
Eosinophils Absolute: 0.5 10*3/uL (ref 0.0–0.5)
Eosinophils Relative: 16 %
HCT: 34.5 % — ABNORMAL LOW (ref 39.0–52.0)
Hemoglobin: 11.6 g/dL — ABNORMAL LOW (ref 13.0–17.0)
Immature Granulocytes: 0 %
Lymphocytes Relative: 4 %
Lymphs Abs: 0.1 10*3/uL — ABNORMAL LOW (ref 0.7–4.0)
MCH: 29.5 pg (ref 26.0–34.0)
MCHC: 33.6 g/dL (ref 30.0–36.0)
MCV: 87.8 fL (ref 80.0–100.0)
Monocytes Absolute: 0.3 10*3/uL (ref 0.1–1.0)
Monocytes Relative: 10 %
Neutro Abs: 2.2 10*3/uL (ref 1.7–7.7)
Neutrophils Relative %: 70 %
Platelet Count: 104 10*3/uL — ABNORMAL LOW (ref 150–400)
RBC: 3.93 MIL/uL — ABNORMAL LOW (ref 4.22–5.81)
RDW: 14.6 % (ref 11.5–15.5)
WBC Count: 3.2 10*3/uL — ABNORMAL LOW (ref 4.0–10.5)
nRBC: 0 % (ref 0.0–0.2)

## 2020-08-18 LAB — CMP (CANCER CENTER ONLY)
ALT: 56 U/L — ABNORMAL HIGH (ref 0–44)
AST: 19 U/L (ref 15–41)
Albumin: 3.4 g/dL — ABNORMAL LOW (ref 3.5–5.0)
Alkaline Phosphatase: 140 U/L — ABNORMAL HIGH (ref 38–126)
Anion gap: 9 (ref 5–15)
BUN: 13 mg/dL (ref 8–23)
CO2: 23 mmol/L (ref 22–32)
Calcium: 7.6 mg/dL — ABNORMAL LOW (ref 8.9–10.3)
Chloride: 105 mmol/L (ref 98–111)
Creatinine: 0.83 mg/dL (ref 0.61–1.24)
GFR, Estimated: >60 mL/min (ref >60)
Glucose, Bld: 99 mg/dL (ref 70–99)
Potassium: 3.5 mmol/L (ref 3.5–5.1)
Sodium: 137 mmol/L (ref 135–145)
Total Bilirubin: 0.7 mg/dL (ref 0.3–1.2)
Total Protein: 5.1 g/dL — ABNORMAL LOW (ref 6.5–8.1)

## 2020-08-18 MED ORDER — DEXAMETHASONE 4 MG PO TABS
20.0000 mg | ORAL_TABLET | Freq: Once | ORAL | Status: AC
  Administered 2020-08-18: 20 mg via ORAL

## 2020-08-18 MED ORDER — DIPHENHYDRAMINE HCL 25 MG PO CAPS
50.0000 mg | ORAL_CAPSULE | Freq: Once | ORAL | Status: AC
  Administered 2020-08-18: 50 mg via ORAL

## 2020-08-18 MED ORDER — ACETAMINOPHEN 325 MG PO TABS: 650.0000 mg | ORAL_TABLET | Freq: Once | ORAL | Status: DC

## 2020-08-18 MED ORDER — MONTELUKAST SODIUM 10 MG PO TABS
10.0000 mg | ORAL_TABLET | Freq: Once | ORAL | Status: AC
  Administered 2020-08-18: 10 mg via ORAL
  Filled 2020-08-18: qty 1

## 2020-08-18 MED ORDER — DARATUMUMAB-HYALURONIDASE-FIHJ 1800-30000 MG-UT/15ML ~~LOC~~ SOLN
1800.0000 mg | Freq: Once | SUBCUTANEOUS | Status: AC
  Administered 2020-08-18: 1800 mg via SUBCUTANEOUS
  Filled 2020-08-18: qty 15

## 2020-08-18 MED ORDER — ACETAMINOPHEN 325 MG PO TABS
ORAL_TABLET | ORAL | Status: AC
  Filled 2020-08-18: qty 2

## 2020-08-18 MED ORDER — LENALIDOMIDE 25 MG PO CAPS: 25.0000 mg | ORAL_CAPSULE | Freq: Every day | ORAL | 0 refills | Status: DC

## 2020-08-18 MED ORDER — BORTEZOMIB CHEMO SQ INJECTION 3.5 MG (2.5MG/ML)
1.3000 mg/m2 | Freq: Once | INTRAMUSCULAR | Status: AC
  Administered 2020-08-18: 2.75 mg via SUBCUTANEOUS
  Filled 2020-08-18: qty 1.1

## 2020-08-18 MED ORDER — SODIUM CHLORIDE 0.9 % IV SOLN
Freq: Once | INTRAVENOUS | Status: DC
  Filled 2020-08-18: qty 250

## 2020-08-18 MED ORDER — DEXAMETHASONE 4 MG PO TABS
ORAL_TABLET | ORAL | Status: AC
  Filled 2020-08-18: qty 5

## 2020-08-18 MED ORDER — DIPHENHYDRAMINE HCL 25 MG PO CAPS
ORAL_CAPSULE | ORAL | Status: AC
  Filled 2020-08-18: qty 2

## 2020-08-18 NOTE — Patient Instructions (Signed)
[facility] Discharge Instructions for Patients Receiving Chemotherapy  Today you received the following chemotherapy agents Darzalex (Faspro), Velcade  To help prevent nausea and vomiting after your treatment, we encourage you to take your nausea medication   1) Begin Dexamethasone (Decadron) tomorrow (Wednesday October 13). Take once daily for 3 days after today. 2) Take Olanzapine 10 mg every pm for nausea 3) Begin taking Marinol 5 mg twice daily for appetite and nausea.  4) Can continue taking Zofran 8 mg twice daily as needed for nausea.       If you develop nausea and vomiting that is not controlled by your nausea medication, call the clinic.   BELOW ARE SYMPTOMS THAT SHOULD BE REPORTED IMMEDIATELY:  *FEVER GREATER THAN 100.5 F  *CHILLS WITH OR WITHOUT FEVER  NAUSEA AND VOMITING THAT IS NOT CONTROLLED WITH YOUR NAUSEA MEDICATION  *UNUSUAL SHORTNESS OF BREATH  *UNUSUAL BRUISING OR BLEEDING  TENDERNESS IN MOUTH AND THROAT WITH OR WITHOUT PRESENCE OF ULCERS  *URINARY PROBLEMS  *BOWEL PROBLEMS  UNUSUAL RASH Items with * indicate a potential emergency and should be followed up as soon as possible.  Feel free to call the clinic should you have any questions or concerns. The clinic phone number is [phone].  Please show the [facility] at check-in to the Emergency Department and triage nurse.

## 2020-08-18 NOTE — Progress Notes (Signed)
Pt discharged in no apparent distress. Pt left ambulatory with walker  Pt aware of discharge instructions and verbalized understanding and had no further questions.

## 2020-08-20 ENCOUNTER — Telehealth: Payer: Self-pay | Admitting: *Deleted

## 2020-08-20 NOTE — Telephone Encounter (Signed)
Call received from patient requesting to hold Decadron 4 mg on day 2 and 3 after chemo, due to uncontrollable hiccoughs with taking Decadron.  Jory Ee NP notified.  Patient instructed to hold day 2 and 3 of Decadron and to take Zofran, Compazine or Ativan as needed for nausea and to call office back if nausea does not subside after taking these medications.  Pt also instructed to drink 64 oz of water daily d/t he states that he has a dry mouth and is dizzy at times.  Teach back done.  Pt appreciative of call back and has no further questions at this time.

## 2020-08-24 ENCOUNTER — Other Ambulatory Visit: Payer: Self-pay | Admitting: *Deleted

## 2020-08-24 DIAGNOSIS — C9000 Multiple myeloma not having achieved remission: Secondary | ICD-10-CM

## 2020-08-24 DIAGNOSIS — C419 Malignant neoplasm of bone and articular cartilage, unspecified: Secondary | ICD-10-CM

## 2020-08-24 MED ORDER — LENALIDOMIDE 25 MG PO CAPS: 25.0000 mg | ORAL_CAPSULE | Freq: Every day | ORAL | 0 refills | Status: DC

## 2020-08-25 ENCOUNTER — Telehealth: Payer: Self-pay | Admitting: *Deleted

## 2020-08-25 ENCOUNTER — Other Ambulatory Visit: Payer: Self-pay

## 2020-08-25 ENCOUNTER — Inpatient Hospital Stay: Payer: BC Managed Care – PPO

## 2020-08-25 ENCOUNTER — Encounter: Payer: Self-pay | Admitting: *Deleted

## 2020-08-25 VITALS — BP 108/35 | HR 55 | Temp 97.7°F | Resp 18

## 2020-08-25 DIAGNOSIS — C9000 Multiple myeloma not having achieved remission: Secondary | ICD-10-CM

## 2020-08-25 DIAGNOSIS — C419 Malignant neoplasm of bone and articular cartilage, unspecified: Secondary | ICD-10-CM

## 2020-08-25 LAB — COMPREHENSIVE METABOLIC PANEL
ALT: 48 U/L — ABNORMAL HIGH (ref 0–44)
AST: 13 U/L — ABNORMAL LOW (ref 15–41)
Albumin: 3.2 g/dL — ABNORMAL LOW (ref 3.5–5.0)
Alkaline Phosphatase: 155 U/L — ABNORMAL HIGH (ref 38–126)
Anion gap: 6 (ref 5–15)
BUN: 17 mg/dL (ref 8–23)
CO2: 22 mmol/L (ref 22–32)
Calcium: 6.9 mg/dL — ABNORMAL LOW (ref 8.9–10.3)
Chloride: 108 mmol/L (ref 98–111)
Creatinine, Ser: 0.9 mg/dL (ref 0.61–1.24)
GFR, Estimated: >60 mL/min (ref >60)
Glucose, Bld: 100 mg/dL — ABNORMAL HIGH (ref 70–99)
Potassium: 3.9 mmol/L (ref 3.5–5.1)
Sodium: 136 mmol/L (ref 135–145)
Total Bilirubin: 0.7 mg/dL (ref 0.3–1.2)
Total Protein: 4.8 g/dL — ABNORMAL LOW (ref 6.5–8.1)

## 2020-08-25 LAB — CBC WITH DIFFERENTIAL (CANCER CENTER ONLY)
Abs Immature Granulocytes: 0.01 10*3/uL (ref 0.00–0.07)
Basophils Absolute: 0 10*3/uL (ref 0.0–0.1)
Basophils Relative: 0 %
Eosinophils Absolute: 0.1 10*3/uL (ref 0.0–0.5)
Eosinophils Relative: 5 %
HCT: 30.8 % — ABNORMAL LOW (ref 39.0–52.0)
Hemoglobin: 10.5 g/dL — ABNORMAL LOW (ref 13.0–17.0)
Immature Granulocytes: 0 %
Lymphocytes Relative: 4 %
Lymphs Abs: 0.1 10*3/uL — ABNORMAL LOW (ref 0.7–4.0)
MCH: 30.2 pg (ref 26.0–34.0)
MCHC: 34.1 g/dL (ref 30.0–36.0)
MCV: 88.5 fL (ref 80.0–100.0)
Monocytes Absolute: 0.3 10*3/uL (ref 0.1–1.0)
Monocytes Relative: 12 %
Neutro Abs: 1.8 10*3/uL (ref 1.7–7.7)
Neutrophils Relative %: 79 %
Platelet Count: 50 10*3/uL — ABNORMAL LOW (ref 150–400)
RBC: 3.48 MIL/uL — ABNORMAL LOW (ref 4.22–5.81)
RDW: 15.1 % (ref 11.5–15.5)
WBC Count: 2.2 10*3/uL — ABNORMAL LOW (ref 4.0–10.5)
nRBC: 0 % (ref 0.0–0.2)

## 2020-08-25 MED ORDER — HEPARIN SOD (PORK) LOCK FLUSH 100 UNIT/ML IV SOLN
250.0000 [IU] | Freq: Once | INTRAVENOUS | Status: DC | PRN
  Filled 2020-08-25: qty 5

## 2020-08-25 MED ORDER — DARATUMUMAB-HYALURONIDASE-FIHJ 1800-30000 MG-UT/15ML ~~LOC~~ SOLN
1800.0000 mg | Freq: Once | SUBCUTANEOUS | Status: AC
  Administered 2020-08-25: 1800 mg via SUBCUTANEOUS
  Filled 2020-08-25: qty 15

## 2020-08-25 MED ORDER — DIPHENHYDRAMINE HCL 25 MG PO CAPS
ORAL_CAPSULE | ORAL | Status: AC
  Filled 2020-08-25: qty 2

## 2020-08-25 MED ORDER — MONTELUKAST SODIUM 10 MG PO TABS
10.0000 mg | ORAL_TABLET | Freq: Once | ORAL | Status: AC
  Administered 2020-08-25: 10 mg via ORAL
  Filled 2020-08-25: qty 1

## 2020-08-25 MED ORDER — ALTEPLASE 2 MG IJ SOLR
2.0000 mg | Freq: Once | INTRAMUSCULAR | Status: DC | PRN
  Filled 2020-08-25: qty 2

## 2020-08-25 MED ORDER — ACETAMINOPHEN 325 MG PO TABS: 650.0000 mg | ORAL_TABLET | Freq: Once | ORAL | Status: DC

## 2020-08-25 MED ORDER — DIPHENHYDRAMINE HCL 25 MG PO CAPS
50.0000 mg | ORAL_CAPSULE | Freq: Once | ORAL | Status: AC
  Administered 2020-08-25: 50 mg via ORAL

## 2020-08-25 MED ORDER — SODIUM CHLORIDE 0.9% FLUSH
3.0000 mL | INTRAVENOUS | Status: DC | PRN
  Filled 2020-08-25: qty 10

## 2020-08-25 MED ORDER — SODIUM CHLORIDE 0.9 % IV SOLN
Freq: Once | INTRAVENOUS | Status: DC
  Filled 2020-08-25: qty 250

## 2020-08-25 MED ORDER — HEPARIN SOD (PORK) LOCK FLUSH 100 UNIT/ML IV SOLN
500.0000 [IU] | Freq: Once | INTRAVENOUS | Status: DC | PRN
  Filled 2020-08-25: qty 5

## 2020-08-25 MED ORDER — SODIUM CHLORIDE 0.9% FLUSH
10.0000 mL | INTRAVENOUS | Status: DC | PRN
  Filled 2020-08-25: qty 10

## 2020-08-25 NOTE — Patient Instructions (Signed)
Daratumumab injection What is this medicine? DARATUMUMAB (dar a toom ue mab) is a monoclonal antibody. It is used to treat multiple myeloma. This medicine may be used for other purposes; ask your health care provider or pharmacist if you have questions. COMMON BRAND NAME(S): DARZALEX What should I tell my health care provider before I take this medicine? They need to know if you have any of these conditions:  infection (especially a virus infection such as chickenpox, herpes, or hepatitis B virus)  lung or breathing disease  an unusual or allergic reaction to daratumumab, other medicines, foods, dyes, or preservatives  pregnant or trying to get pregnant  breast-feeding How should I use this medicine? This medicine is for infusion into a vein. It is given by a health care professional in a hospital or clinic setting. Talk to your pediatrician regarding the use of this medicine in children. Special care may be needed. Overdosage: If you think you have taken too much of this medicine contact a poison control center or emergency room at once. NOTE: This medicine is only for you. Do not share this medicine with others. What if I miss a dose? Keep appointments for follow-up doses as directed. It is important not to miss your dose. Call your doctor or health care professional if you are unable to keep an appointment. What may interact with this medicine? Interactions have not been studied. This list may not describe all possible interactions. Give your health care provider a list of all the medicines, herbs, non-prescription drugs, or dietary supplements you use. Also tell them if you smoke, drink alcohol, or use illegal drugs. Some items may interact with your medicine. What should I watch for while using this medicine? This drug may make you feel generally unwell. Report any side effects. Continue your course of treatment even though you feel ill unless your doctor tells you to stop. This  medicine can cause serious allergic reactions. To reduce your risk you may need to take medicine before treatment with this medicine. Take your medicine as directed. This medicine can affect the results of blood tests to match your blood type. These changes can last for up to 6 months after the final dose. Your healthcare provider will do blood tests to match your blood type before you start treatment. Tell all of your healthcare providers that you are being treated with this medicine before receiving a blood transfusion. This medicine can affect the results of some tests used to determine treatment response; extra tests may be needed to evaluate response. Do not become pregnant while taking this medicine or for 3 months after stopping it. Women should inform their doctor if they wish to become pregnant or think they might be pregnant. There is a potential for serious side effects to an unborn child. Talk to your health care professional or pharmacist for more information. What side effects may I notice from receiving this medicine? Side effects that you should report to your doctor or health care professional as soon as possible:  allergic reactions like skin rash, itching or hives, swelling of the face, lips, or tongue  breathing problems  chills  cough  dizziness  feeling faint or lightheaded  headache  low blood counts - this medicine may decrease the number of white blood cells, red blood cells and platelets. You may be at increased risk for infections and bleeding.  nausea, vomiting  shortness of breath  signs of decreased platelets or bleeding - bruising, pinpoint red spots on  the skin, black, tarry stools, blood in the urine  signs of decreased red blood cells - unusually weak or tired, feeling faint or lightheaded, falls  signs of infection - fever or chills, cough, sore throat, pain or difficulty passing urine  signs and symptoms of liver injury like dark yellow or brown  urine; general ill feeling or flu-like symptoms; light-colored stools; loss of appetite; right upper belly pain; unusually weak or tired; yellowing of the eyes or skin Side effects that usually do not require medical attention (report to your doctor or health care professional if they continue or are bothersome):  back pain  constipation  diarrhea  joint pain  muscle cramps  pain, tingling, numbness in the hands or feet  swelling of the ankles, feet, hands  tiredness  trouble sleeping This list may not describe all possible side effects. Call your doctor for medical advice about side effects. You may report side effects to FDA at 1-800-FDA-1088. Where should I keep my medicine? This drug is given in a hospital or clinic and will not be stored at home. NOTE: This sheet is a summary. It may not cover all possible information. If you have questions about this medicine, talk to your doctor, pharmacist, or health care provider.  2020 Elsevier/Gold Standard (2019-06-25 18:10:54)  

## 2020-08-25 NOTE — Telephone Encounter (Signed)
Dr. [name] notified of platelet count-50.  Order received to hold Velcade and give Faspro per Dr. [name].

## 2020-08-25 NOTE — Progress Notes (Signed)
Hold Velcade and give Faspro d/t platelet count per Dr. [name]. Pt. stable and asymptomatic upon discharge.

## 2020-08-25 NOTE — Progress Notes (Signed)
Patient states his nausea is improving. He is trying to avoid pain medicine as he believes this makes his nausea worse. His weight is stable.   Oncology Nurse Navigator Documentation  Oncology Nurse Navigator Flowsheets 08/25/2020  Abnormal Finding Date -  Confirmed Diagnosis Date -  Diagnosis Status -  Planned Course of Treatment -  Phase of Treatment -  Chemotherapy Actual Start Date: -  Radiation Actual Start Date: -  Radiation Expected End Date: -  Radiation Actual End Date: -  Navigator Follow Up Date: 09/09/2020  Navigator Follow Up Reason: Follow-up Appointment  Navigator Location [facility]  Navigator Encounter Type Treatment  Telephone -  Patient Visit Type MedOnc  Treatment Phase Active Tx  Barriers/Navigation Needs Coordination of Care;Education;Family Concerns  Education -  Interventions Psycho-Social Support  Acuity Level 2-Minimal Needs (1-2 Barriers Identified)  Referrals -  Coordination of Care -  Education Method -  Support Groups/Services Friends and Family  Time Spent with Patient 15

## 2020-08-26 ENCOUNTER — Telehealth: Payer: Self-pay | Admitting: *Deleted

## 2020-08-26 NOTE — Telephone Encounter (Signed)
Received call from patient stating that he has a pink rash about the size of his palm after receiving Faspro yesterday.  No itching per patient, slightly tender, no open areas.  Slightly warm.  Spoke with our Oncology pharmacist.  Advised patient to keep an eye on the rash and call us if gets any worse.  Usually these rashes resolve on their own but to let us know if it continues to get red or inflamed.Patient understands instructions and will keep in touch with [place].

## 2020-08-27 ENCOUNTER — Other Ambulatory Visit: Payer: Self-pay | Admitting: Pharmacist

## 2020-08-27 ENCOUNTER — Telehealth: Payer: Self-pay | Admitting: *Deleted

## 2020-08-27 NOTE — Telephone Encounter (Addendum)
Call received from patient's wife stating that rash to injection site on patient's abdomen is not as red as it was yesterday, is less warm, is more sensitive to touch and is the same size as yesterday.  Pt.'s wife notified per order of Dr. [name] to take OTC Atarax and Pepcid as directed on bottle and to use warm compresses as needed to abdomen. Pt.'s wife also instructed that Pepcid would be given as a pre med for pt.'s treatments starting with next treatment.  Teach back done.  Pt.'s wife appreciative of assistance and has no further questions at this time.

## 2020-08-28 ENCOUNTER — Other Ambulatory Visit: Payer: Self-pay | Admitting: *Deleted

## 2020-08-28 MED ORDER — HYDROXYZINE HCL 10 MG PO TABS: 10.0000 mg | ORAL_TABLET | Freq: Three times a day (TID) | ORAL | 0 refills | Status: DC | PRN

## 2020-08-28 NOTE — Telephone Encounter (Signed)
Call placed to check on patient's abd rash.  Pt states that rash is "better today, but itching more."  Pt states that he has taken Pepcid, but did not take Atarax since it's a medication that needs to be sent in and it was not sent in yesterday.  My apologies made to pt for not sending in Atarax for him.  Atarax sent to [facility] per pt.'s request.  Pt appreciative of call and states that he will take Atarax as needed for itching.

## 2020-08-31 ENCOUNTER — Other Ambulatory Visit: Payer: Self-pay | Admitting: *Deleted

## 2020-08-31 DIAGNOSIS — C9000 Multiple myeloma not having achieved remission: Secondary | ICD-10-CM

## 2020-09-01 ENCOUNTER — Inpatient Hospital Stay: Payer: BC Managed Care – PPO | Attending: Hematology & Oncology

## 2020-09-01 ENCOUNTER — Ambulatory Visit: Payer: BC Managed Care – PPO | Admitting: Hematology & Oncology

## 2020-09-01 ENCOUNTER — Other Ambulatory Visit: Payer: Self-pay

## 2020-09-01 ENCOUNTER — Telehealth: Payer: Self-pay | Admitting: *Deleted

## 2020-09-01 ENCOUNTER — Other Ambulatory Visit: Payer: BC Managed Care – PPO

## 2020-09-01 ENCOUNTER — Ambulatory Visit: Payer: BC Managed Care – PPO

## 2020-09-01 ENCOUNTER — Inpatient Hospital Stay: Payer: BC Managed Care – PPO

## 2020-09-01 VITALS — BP 113/50 | HR 65 | Resp 18

## 2020-09-01 DIAGNOSIS — Z7952 Long term (current) use of systemic steroids: Secondary | ICD-10-CM | POA: Insufficient documentation

## 2020-09-01 DIAGNOSIS — Z79899 Other long term (current) drug therapy: Secondary | ICD-10-CM | POA: Diagnosis not present

## 2020-09-01 DIAGNOSIS — Z5111 Encounter for antineoplastic chemotherapy: Secondary | ICD-10-CM | POA: Insufficient documentation

## 2020-09-01 DIAGNOSIS — R110 Nausea: Secondary | ICD-10-CM | POA: Insufficient documentation

## 2020-09-01 DIAGNOSIS — C9000 Multiple myeloma not having achieved remission: Secondary | ICD-10-CM

## 2020-09-01 DIAGNOSIS — R197 Diarrhea, unspecified: Secondary | ICD-10-CM | POA: Insufficient documentation

## 2020-09-01 DIAGNOSIS — Z5112 Encounter for antineoplastic immunotherapy: Secondary | ICD-10-CM | POA: Diagnosis not present

## 2020-09-01 DIAGNOSIS — Z7982 Long term (current) use of aspirin: Secondary | ICD-10-CM | POA: Insufficient documentation

## 2020-09-01 LAB — COMPREHENSIVE METABOLIC PANEL
ALT: 44 U/L (ref 0–44)
AST: 20 U/L (ref 15–41)
Albumin: 3.2 g/dL — ABNORMAL LOW (ref 3.5–5.0)
Alkaline Phosphatase: 253 U/L — ABNORMAL HIGH (ref 38–126)
Anion gap: 6 (ref 5–15)
BUN: 12 mg/dL (ref 8–23)
CO2: 25 mmol/L (ref 22–32)
Calcium: 7.7 mg/dL — ABNORMAL LOW (ref 8.9–10.3)
Chloride: 110 mmol/L (ref 98–111)
Creatinine, Ser: 0.76 mg/dL (ref 0.61–1.24)
GFR, Estimated: >60 mL/min (ref >60)
Glucose, Bld: 118 mg/dL — ABNORMAL HIGH (ref 70–99)
Potassium: 4.2 mmol/L (ref 3.5–5.1)
Sodium: 141 mmol/L (ref 135–145)
Total Bilirubin: 0.4 mg/dL (ref 0.3–1.2)
Total Protein: 5.1 g/dL — ABNORMAL LOW (ref 6.5–8.1)

## 2020-09-01 LAB — CBC WITH DIFFERENTIAL (CANCER CENTER ONLY)
Abs Immature Granulocytes: 0.01 10*3/uL (ref 0.00–0.07)
Basophils Absolute: 0 10*3/uL (ref 0.0–0.1)
Basophils Relative: 1 %
Eosinophils Absolute: 0.1 10*3/uL (ref 0.0–0.5)
Eosinophils Relative: 15 %
HCT: 31.7 % — ABNORMAL LOW (ref 39.0–52.0)
Hemoglobin: 10.4 g/dL — ABNORMAL LOW (ref 13.0–17.0)
Immature Granulocytes: 1 %
Lymphocytes Relative: 9 %
Lymphs Abs: 0.1 10*3/uL — ABNORMAL LOW (ref 0.7–4.0)
MCH: 29.9 pg (ref 26.0–34.0)
MCHC: 32.8 g/dL (ref 30.0–36.0)
MCV: 91.1 fL (ref 80.0–100.0)
Monocytes Absolute: 0.2 10*3/uL (ref 0.1–1.0)
Monocytes Relative: 25 %
Neutro Abs: 0.4 10*3/uL — CL (ref 1.7–7.7)
Neutrophils Relative %: 49 %
Platelet Count: 82 10*3/uL — ABNORMAL LOW (ref 150–400)
RBC: 3.48 MIL/uL — ABNORMAL LOW (ref 4.22–5.81)
RDW: 15.2 % (ref 11.5–15.5)
WBC Count: 0.8 10*3/uL — CL (ref 4.0–10.5)
nRBC: 0 % (ref 0.0–0.2)

## 2020-09-01 MED ORDER — DEXAMETHASONE 4 MG PO TABS
ORAL_TABLET | ORAL | Status: AC
  Filled 2020-09-01: qty 5

## 2020-09-01 MED ORDER — FAMOTIDINE IN NACL 20-0.9 MG/50ML-% IV SOLN
INTRAVENOUS | Status: AC
  Filled 2020-09-01: qty 50

## 2020-09-01 MED ORDER — BORTEZOMIB CHEMO SQ INJECTION 3.5 MG (2.5MG/ML)
1.3000 mg/m2 | Freq: Once | INTRAMUSCULAR | Status: AC
  Administered 2020-09-01: 2.75 mg via SUBCUTANEOUS
  Filled 2020-09-01: qty 1.1

## 2020-09-01 MED ORDER — DARATUMUMAB-HYALURONIDASE-FIHJ 1800-30000 MG-UT/15ML ~~LOC~~ SOLN
1800.0000 mg | Freq: Once | SUBCUTANEOUS | Status: AC
  Administered 2020-09-01: 1800 mg via SUBCUTANEOUS
  Filled 2020-09-01: qty 15

## 2020-09-01 MED ORDER — FAMOTIDINE 20 MG PO TABS
40.0000 mg | ORAL_TABLET | Freq: Once | ORAL | Status: AC
  Administered 2020-09-01: 40 mg via ORAL

## 2020-09-01 MED ORDER — DEXAMETHASONE 4 MG PO TABS
20.0000 mg | ORAL_TABLET | Freq: Once | ORAL | Status: AC
  Administered 2020-09-01: 20 mg via ORAL

## 2020-09-01 MED ORDER — SODIUM CHLORIDE 0.9 % IV SOLN
Freq: Once | INTRAVENOUS | Status: DC
  Filled 2020-09-01: qty 250

## 2020-09-01 MED ORDER — SODIUM CHLORIDE 0.9 % IV SOLN
40.0000 mg | Freq: Once | INTRAVENOUS | Status: DC
  Filled 2020-09-01: qty 4

## 2020-09-01 MED ORDER — DIPHENHYDRAMINE HCL 25 MG PO CAPS
ORAL_CAPSULE | ORAL | Status: AC
  Filled 2020-09-01: qty 2

## 2020-09-01 MED ORDER — ACETAMINOPHEN 325 MG PO TABS: 650.0000 mg | ORAL_TABLET | Freq: Once | ORAL | Status: DC

## 2020-09-01 MED ORDER — DIPHENHYDRAMINE HCL 25 MG PO CAPS
50.0000 mg | ORAL_CAPSULE | Freq: Once | ORAL | Status: AC
  Administered 2020-09-01: 50 mg via ORAL

## 2020-09-01 NOTE — Patient Instructions (Addendum)
[facility] Discharge Instructions for Patients Receiving Chemotherapy  Today you received the following chemotherapy agents Velcade, Darzalex,   P  To help prevent nausea and vomiting after your treatment, we encourage you to take your nausea medication    If you develop nausea and vomiting that is not controlled by your nausea medication, call the clinic.   BELOW ARE SYMPTOMS THAT SHOULD BE REPORTED IMMEDIATELY:  *FEVER GREATER THAN 100.5 F  *CHILLS WITH OR WITHOUT FEVER  NAUSEA AND VOMITING THAT IS NOT CONTROLLED WITH YOUR NAUSEA MEDICATION  *UNUSUAL SHORTNESS OF BREATH  *UNUSUAL BRUISING OR BLEEDING  TENDERNESS IN MOUTH AND THROAT WITH OR WITHOUT PRESENCE OF ULCERS  *URINARY PROBLEMS  *BOWEL PROBLEMS  UNUSUAL RASH Items with * indicate a potential emergency and should be followed up as soon as possible.  Feel free to call the clinic should you have any questions or concerns. The clinic phone number is [phone].  Please show the [place] at check-in to the Emergency Department and triage nurse.  Bortezomib injection What is this medicine? BORTEZOMIB (bor TEZ oh mib) is a medicine that targets proteins in cancer cells and stops the cancer cells from growing. It is used to treat multiple myeloma and mantle-cell lymphoma. This medicine may be used for other purposes; ask your health care provider or pharmacist if you have questions. COMMON BRAND NAME(S): Velcade What should I tell my health care provider before I take this medicine? They need to know if you have any of these conditions:  diabetes  heart disease  irregular heartbeat  liver disease  on hemodialysis  low blood counts, like low white blood cells, platelets, or hemoglobin  peripheral neuropathy  taking medicine for blood pressure  an unusual or allergic reaction to bortezomib, mannitol, boron, other medicines, foods, dyes, or preservatives  pregnant or trying to get  pregnant  breast-feeding How should I use this medicine? This medicine is for injection into a vein or for injection under the skin. It is given by a health care professional in a hospital or clinic setting. Talk to your pediatrician regarding the use of this medicine in children. Special care may be needed. Overdosage: If you think you have taken too much of this medicine contact a poison control center or emergency room at once. NOTE: This medicine is only for you. Do not share this medicine with others. What if I miss a dose? It is important not to miss your dose. Call your doctor or health care professional if you are unable to keep an appointment. What may interact with this medicine? This medicine may interact with the following medications:  ketoconazole  rifampin  ritonavir  St. John's Wort This list may not describe all possible interactions. Give your health care provider a list of all the medicines, herbs, non-prescription drugs, or dietary supplements you use. Also tell them if you smoke, drink alcohol, or use illegal drugs. Some items may interact with your medicine. What should I watch for while using this medicine? You may get drowsy or dizzy. Do not drive, use machinery, or do anything that needs mental alertness until you know how this medicine affects you. Do not stand or sit up quickly, especially if you are an older patient. This reduces the risk of dizzy or fainting spells. In some cases, you may be given additional medicines to help with side effects. Follow all directions for their use. Call your doctor or health care professional for advice if you  get a fever, chills or sore throat, or other symptoms of a cold or flu. Do not treat yourself. This drug decreases your body's ability to fight infections. Try to avoid being around people who are sick. This medicine may increase your risk to bruise or bleed. Call your doctor or health care professional if you notice any  unusual bleeding. You may need blood work done while you are taking this medicine. In some patients, this medicine may cause a serious brain infection that may cause death. If you have any problems seeing, thinking, speaking, walking, or standing, tell your doctor right away. If you cannot reach your doctor, urgently seek other source of medical care. Check with your doctor or health care professional if you get an attack of severe diarrhea, nausea and vomiting, or if you sweat a lot. The loss of too much body fluid can make it dangerous for you to take this medicine. Do not become pregnant while taking this medicine or for at least 7 months after stopping it. Women should inform their doctor if they wish to become pregnant or think they might be pregnant. Men should not father a child while taking this medicine and for at least 4 months after stopping it. There is a potential for serious side effects to an unborn child. Talk to your health care professional or pharmacist for more information. Do not breast-feed an infant while taking this medicine or for 2 months after stopping it. This medicine may interfere with the ability to have a child. You should talk with your doctor or health care professional if you are concerned about your fertility. What side effects may I notice from receiving this medicine? Side effects that you should report to your doctor or health care professional as soon as possible:  allergic reactions like skin rash, itching or hives, swelling of the face, lips, or tongue  breathing problems  changes in hearing  changes in vision  fast, irregular heartbeat  feeling faint or lightheaded, falls  pain, tingling, numbness in the hands or feet  right upper belly pain  seizures  swelling of the ankles, feet, hands  unusual bleeding or bruising  unusually weak or tired  vomiting  yellowing of the eyes or skin Side effects that usually do not require medical  attention (report to your doctor or health care professional if they continue or are bothersome):  changes in emotions or moods  constipation  diarrhea  loss of appetite  headache  irritation at site where injected  nausea This list may not describe all possible side effects. Call your doctor for medical advice about side effects. You may report side effects to FDA at 1-800-FDA-1088. Where should I keep my medicine? This drug is given in a hospital or clinic and will not be stored at home. NOTE: This sheet is a summary. It may not cover all possible information. If you have questions about this medicine, talk to your doctor, pharmacist, or health care provider.  2020 Elsevier/Gold Standard (2018-02-26 16:29:31) Daratumumab injection What is this medicine? DARATUMUMAB (dar a toom ue mab) is a monoclonal antibody. It is used to treat multiple myeloma. This medicine may be used for other purposes; ask your health care provider or pharmacist if you have questions. COMMON BRAND NAME(S): DARZALEX What should I tell my health care provider before I take this medicine? They need to know if you have any of these conditions:  infection (especially a virus infection such as chickenpox, herpes, or hepatitis B  virus)  lung or breathing disease  an unusual or allergic reaction to daratumumab, other medicines, foods, dyes, or preservatives  pregnant or trying to get pregnant  breast-feeding How should I use this medicine? This medicine is for infusion into a vein. It is given by a health care professional in a hospital or clinic setting. Talk to your pediatrician regarding the use of this medicine in children. Special care may be needed. Overdosage: If you think you have taken too much of this medicine contact a poison control center or emergency room at once. NOTE: This medicine is only for you. Do not share this medicine with others. What if I miss a dose? Keep appointments for  follow-up doses as directed. It is important not to miss your dose. Call your doctor or health care professional if you are unable to keep an appointment. What may interact with this medicine? Interactions have not been studied. This list may not describe all possible interactions. Give your health care provider a list of all the medicines, herbs, non-prescription drugs, or dietary supplements you use. Also tell them if you smoke, drink alcohol, or use illegal drugs. Some items may interact with your medicine. What should I watch for while using this medicine? This drug may make you feel generally unwell. Report any side effects. Continue your course of treatment even though you feel ill unless your doctor tells you to stop. This medicine can cause serious allergic reactions. To reduce your risk you may need to take medicine before treatment with this medicine. Take your medicine as directed. This medicine can affect the results of blood tests to match your blood type. These changes can last for up to 6 months after the final dose. Your healthcare provider will do blood tests to match your blood type before you start treatment. Tell all of your healthcare providers that you are being treated with this medicine before receiving a blood transfusion. This medicine can affect the results of some tests used to determine treatment response; extra tests may be needed to evaluate response. Do not become pregnant while taking this medicine or for 3 months after stopping it. Women should inform their doctor if they wish to become pregnant or think they might be pregnant. There is a potential for serious side effects to an unborn child. Talk to your health care professional or pharmacist for more information. What side effects may I notice from receiving this medicine? Side effects that you should report to your doctor or health care professional as soon as possible:  allergic reactions like skin rash, itching or  hives, swelling of the face, lips, or tongue  breathing problems  chills  cough  dizziness  feeling faint or lightheaded  headache  low blood counts - this medicine may decrease the number of white blood cells, red blood cells and platelets. You may be at increased risk for infections and bleeding.  nausea, vomiting  shortness of breath  signs of decreased platelets or bleeding - bruising, pinpoint red spots on the skin, black, tarry stools, blood in the urine  signs of decreased red blood cells - unusually weak or tired, feeling faint or lightheaded, falls  signs of infection - fever or chills, cough, sore throat, pain or difficulty passing urine  signs and symptoms of liver injury like dark yellow or brown urine; general ill feeling or flu-like symptoms; light-colored stools; loss of appetite; right upper belly pain; unusually weak or tired; yellowing of the eyes or skin Side effects  that usually do not require medical attention (report to your doctor or health care professional if they continue or are bothersome):  back pain  constipation  diarrhea  joint pain  muscle cramps  pain, tingling, numbness in the hands or feet  swelling of the ankles, feet, hands  tiredness  trouble sleeping This list may not describe all possible side effects. Call your doctor for medical advice about side effects. You may report side effects to FDA at 1-800-FDA-1088. Where should I keep my medicine? This drug is given in a hospital or clinic and will not be stored at home. NOTE: This sheet is a summary. It may not cover all possible information. If you have questions about this medicine, talk to your doctor, pharmacist, or health care provider.  2020 Elsevier/Gold Standard (2019-06-25 18:10:54)

## 2020-09-01 NOTE — Progress Notes (Signed)
Pt discharged in no apparent distress. Pt left ambulatory without assistance. Pt aware of discharge instructions and verbalized understanding and had no further questions.  

## 2020-09-01 NOTE — Telephone Encounter (Signed)
Richardson Landry from lab brought to my attention a panic labs. WBC is 0.8 and ANC is 0.4. Results given to MD.

## 2020-09-01 NOTE — Progress Notes (Signed)
Reviewed labs with Dr [name]. ANC .4  Platelets 82, WBC .8 Ok to treat today based on Patients rising monocytes.    Per Dr [name], patient to take Revlimid 14 days instead of 21.  Patient aware

## 2020-09-08 ENCOUNTER — Encounter: Payer: Self-pay | Admitting: *Deleted

## 2020-09-08 ENCOUNTER — Other Ambulatory Visit: Payer: Self-pay

## 2020-09-08 ENCOUNTER — Inpatient Hospital Stay (HOSPITAL_BASED_OUTPATIENT_CLINIC_OR_DEPARTMENT_OTHER): Payer: BC Managed Care – PPO | Admitting: Hematology & Oncology

## 2020-09-08 ENCOUNTER — Telehealth: Payer: Self-pay | Admitting: Hematology & Oncology

## 2020-09-08 ENCOUNTER — Inpatient Hospital Stay: Payer: BC Managed Care – PPO

## 2020-09-08 ENCOUNTER — Encounter: Payer: Self-pay | Admitting: Hematology & Oncology

## 2020-09-08 VITALS — BP 110/53 | HR 59 | Temp 98.3°F | Resp 18 | Wt 196.2 lb

## 2020-09-08 DIAGNOSIS — C9000 Multiple myeloma not having achieved remission: Secondary | ICD-10-CM

## 2020-09-08 DIAGNOSIS — C419 Malignant neoplasm of bone and articular cartilage, unspecified: Secondary | ICD-10-CM

## 2020-09-08 LAB — CBC WITH DIFFERENTIAL (CANCER CENTER ONLY)
Abs Immature Granulocytes: 0.01 10*3/uL (ref 0.00–0.07)
Basophils Absolute: 0 10*3/uL (ref 0.0–0.1)
Basophils Relative: 1 %
Eosinophils Absolute: 0 10*3/uL (ref 0.0–0.5)
Eosinophils Relative: 2 %
HCT: 32.5 % — ABNORMAL LOW (ref 39.0–52.0)
Hemoglobin: 10.4 g/dL — ABNORMAL LOW (ref 13.0–17.0)
Immature Granulocytes: 0 %
Lymphocytes Relative: 7 %
Lymphs Abs: 0.2 10*3/uL — ABNORMAL LOW (ref 0.7–4.0)
MCH: 29.4 pg (ref 26.0–34.0)
MCHC: 32 g/dL (ref 30.0–36.0)
MCV: 91.8 fL (ref 80.0–100.0)
Monocytes Absolute: 0.6 10*3/uL (ref 0.1–1.0)
Monocytes Relative: 27 %
Neutro Abs: 1.5 10*3/uL — ABNORMAL LOW (ref 1.7–7.7)
Neutrophils Relative %: 63 %
Platelet Count: 171 10*3/uL (ref 150–400)
RBC: 3.54 MIL/uL — ABNORMAL LOW (ref 4.22–5.81)
RDW: 15.9 % — ABNORMAL HIGH (ref 11.5–15.5)
WBC Count: 2.4 10*3/uL — ABNORMAL LOW (ref 4.0–10.5)
nRBC: 0 % (ref 0.0–0.2)

## 2020-09-08 LAB — CMP (CANCER CENTER ONLY)
ALT: 28 U/L (ref 0–44)
AST: 12 U/L — ABNORMAL LOW (ref 15–41)
Albumin: 3.4 g/dL — ABNORMAL LOW (ref 3.5–5.0)
Alkaline Phosphatase: 228 U/L — ABNORMAL HIGH (ref 38–126)
Anion gap: 7 (ref 5–15)
BUN: 14 mg/dL (ref 8–23)
CO2: 23 mmol/L (ref 22–32)
Calcium: 6.6 mg/dL — ABNORMAL LOW (ref 8.9–10.3)
Chloride: 111 mmol/L (ref 98–111)
Creatinine: 0.75 mg/dL (ref 0.61–1.24)
GFR, Estimated: >60 mL/min (ref >60)
Glucose, Bld: 98 mg/dL (ref 70–99)
Potassium: 4 mmol/L (ref 3.5–5.1)
Sodium: 141 mmol/L (ref 135–145)
Total Bilirubin: 0.5 mg/dL (ref 0.3–1.2)
Total Protein: 5.3 g/dL — ABNORMAL LOW (ref 6.5–8.1)

## 2020-09-08 LAB — LACTATE DEHYDROGENASE: LDH: 145 U/L (ref 98–192)

## 2020-09-08 MED ORDER — DENOSUMAB 120 MG/1.7ML ~~LOC~~ SOLN
SUBCUTANEOUS | Status: AC
  Filled 2020-09-08: qty 1.7

## 2020-09-08 MED ORDER — DIPHENHYDRAMINE HCL 25 MG PO CAPS
ORAL_CAPSULE | ORAL | Status: AC
  Filled 2020-09-08: qty 2

## 2020-09-08 MED ORDER — DARATUMUMAB-HYALURONIDASE-FIHJ 1800-30000 MG-UT/15ML ~~LOC~~ SOLN
1800.0000 mg | Freq: Once | SUBCUTANEOUS | Status: AC
  Administered 2020-09-08: 1800 mg via SUBCUTANEOUS
  Filled 2020-09-08: qty 15

## 2020-09-08 MED ORDER — DEXAMETHASONE 4 MG PO TABS
20.0000 mg | ORAL_TABLET | Freq: Once | ORAL | Status: AC
  Administered 2020-09-08: 20 mg via ORAL

## 2020-09-08 MED ORDER — SODIUM CHLORIDE 0.9 % IV SOLN
Freq: Once | INTRAVENOUS | Status: DC
  Filled 2020-09-08: qty 250

## 2020-09-08 MED ORDER — SODIUM CHLORIDE 0.9% FLUSH
3.0000 mL | INTRAVENOUS | Status: DC | PRN
  Filled 2020-09-08: qty 10

## 2020-09-08 MED ORDER — FAMOTIDINE 20 MG PO TABS
40.0000 mg | ORAL_TABLET | Freq: Once | ORAL | Status: AC
  Administered 2020-09-08: 40 mg via ORAL

## 2020-09-08 MED ORDER — ACETAMINOPHEN 325 MG PO TABS: 650.0000 mg | ORAL_TABLET | Freq: Once | ORAL | Status: DC

## 2020-09-08 MED ORDER — DEXAMETHASONE 4 MG PO TABS
ORAL_TABLET | ORAL | Status: AC
  Filled 2020-09-08: qty 5

## 2020-09-08 MED ORDER — BORTEZOMIB CHEMO SQ INJECTION 3.5 MG (2.5MG/ML)
1.3000 mg/m2 | Freq: Once | INTRAMUSCULAR | Status: AC
  Administered 2020-09-08: 2.75 mg via SUBCUTANEOUS
  Filled 2020-09-08: qty 1.1

## 2020-09-08 MED ORDER — DIPHENHYDRAMINE HCL 25 MG PO CAPS
50.0000 mg | ORAL_CAPSULE | Freq: Once | ORAL | Status: AC
  Administered 2020-09-08: 50 mg via ORAL

## 2020-09-08 MED ORDER — SODIUM CHLORIDE 0.9 % IV SOLN: 40.0000 mg | Freq: Once | INTRAVENOUS | Status: DC

## 2020-09-08 MED ORDER — DENOSUMAB 120 MG/1.7ML ~~LOC~~ SOLN: 120.0000 mg | Freq: Once | SUBCUTANEOUS | Status: DC

## 2020-09-08 NOTE — Telephone Encounter (Signed)
Appointments scheduled calendar printed per 11/8 los

## 2020-09-08 NOTE — Progress Notes (Signed)
Oncology Nurse Navigator Documentation  Oncology Nurse Navigator Flowsheets 09/08/2020  Abnormal Finding Date -  Confirmed Diagnosis Date -  Diagnosis Status -  Planned Course of Treatment -  Phase of Treatment -  Chemotherapy Actual Start Date: -  Radiation Actual Start Date: -  Radiation Expected End Date: -  Radiation Actual End Date: -  Navigator Follow Up Date: 10/06/2020  Navigator Follow Up Reason: Follow-up Appointment;Chemotherapy  Navigator Location [facility]  Navigator Encounter Type Treatment;Appt/Treatment Plan Review  Telephone -  Patient Visit Type MedOnc  Treatment Phase Active Tx  Barriers/Navigation Needs Coordination of Care;Education;Family Concerns  Education -  Interventions Psycho-Social Support  Acuity Level 2-Minimal Needs (1-2 Barriers Identified)  Referrals -  Coordination of Care -  Education Method -  Support Groups/Services Friends and Family  Time Spent with Patient 15

## 2020-09-08 NOTE — Progress Notes (Signed)
Hold Xgeva today per Dr. [name]. Calcium 6.6.

## 2020-09-08 NOTE — Progress Notes (Signed)
Hematology and Oncology Follow Up Visit  Bruce Smith [id number] 15-Jan-1951 69 y.o. 09/08/2020   Principle Diagnosis:   Kappa light chain myeloma-extensive bone disease-pathologic fracture of left scapula and left femur - t(11:14), 13q-, 1p-,1q-  Current Therapy:    Status post surgical repair of left femur-07/14/2020  XRT to left shoulder and left hip  Xgeva 120 mg subcu every 3 months-next dose in February 2022  Faspro/Velcade/Revlimid/Decadron -- s/p cycle #1 -- start on 08/06/2020  ( Revlimid is 14 on /14 off)     Interim History:  Mr. Luberto is back for follow-up.  He had his first cycle of treatment.  He seemed to tolerate this fairly well.  His blood counts certainly had a tough time.  I think a lot of this is probably from the Revlimid.  I am going to have him take Revlimid 14 days on and 14 days off now.  He has better nausea.  His appetite is still down a little bit.  I told him to try the Marinol 3 times a day.  There has been no problems with rashes.  The pain is seems to be doing a little bit better.  He still has some pain in the hip area.  He is on tramadol along with Tylenol.  He has had a little bit of diarrhea.  Again this may be from the Revlimid.  He has had no fever.  He has had no bleeding.  Currently, his performance status is ECOG 1.   Medications:  Current Outpatient Medications:  .  acetaminophen (TYLENOL) 500 MG tablet, Take 1,000 mg by mouth every 6 (six) hours., Disp: , Rfl:  .  aspirin 81 MG EC tablet, Take 81 mg by mouth daily. Swallow whole., Disp: , Rfl:  .  atorvastatin (LIPITOR) 20 MG tablet, Take 20 mg by mouth daily., Disp: , Rfl:  .  baclofen (LIORESAL) 10 MG tablet, Take 1 tablet (10 mg total) by mouth 3 (three) times daily. As needed for muscle spasm, Disp: 50 tablet, Rfl: 0 .  Coenzyme Q10 (CO Q-10) 200 MG CAPS, Take 200 mg by mouth daily., Disp: , Rfl:  .  cyclobenzaprine (FLEXERIL) 10 MG tablet, Take 1 tablet (10 mg total) by mouth 3  (three) times daily as needed for muscle spasms., Disp: 30 tablet, Rfl: 0 .  dexamethasone (DECADRON) 4 MG tablet, Take 1 tablet (4 mg total) by mouth daily. Take for 3 days after chemotherapy treatment. Take with meals., Disp: 30 tablet, Rfl: 2 .  dronabinol (MARINOL) 5 MG capsule, Take 1 capsule (5 mg total) by mouth 2 (two) times daily before a meal., Disp: 60 capsule, Rfl: 0 .  famciclovir (FAMVIR) 500 MG tablet, Take 1 tablet (500 mg total) by mouth daily., Disp: 30 tablet, Rfl: 8 .  hydrOXYzine (ATARAX/VISTARIL) 10 MG tablet, Take 1 tablet (10 mg total) by mouth 3 (three) times daily as needed., Disp: 30 tablet, Rfl: 0 .  lenalidomide (REVLIMID) 25 MG capsule, Take 1 capsule (25 mg total) by mouth daily. 21 days on, then 7 days off. [employer] Josem Kaufmann #[id number], Disp: 21 capsule, Rfl: 0 .  lisinopril (ZESTRIL) 5 MG tablet, Take 7.5 mg by mouth daily., Disp: , Rfl:  .  LORazepam (ATIVAN) 0.5 MG tablet, Take 1 tablet (0.5 mg total) by mouth every 6 (six) hours as needed for anxiety (nausea)., Disp: 40 tablet, Rfl: 0 .  metoprolol succinate (TOPROL-XL) 50 MG 24 hr tablet, Take 50 mg by mouth daily., Disp: , Rfl:  .  morphine (MS CONTIN) 15 MG 12 hr tablet, Take 1 tablet (15 mg total) by mouth every 12 (twelve) hours., Disp: 60 tablet, Rfl: 0 .  morphine (MSIR) 15 MG tablet, Take 1 tablet (15 mg total) by mouth every 6 (six) hours as needed for severe pain., Disp: 90 tablet, Rfl: 0 .  nitroGLYCERIN (NITROSTAT) 0.4 MG SL tablet, Place 0.4 mg under the tongue every 5 (five) minutes as needed for chest pain., Disp: , Rfl:  .  OLANZapine (ZYPREXA) 10 MG tablet, Take 1 tablet (10 mg total) by mouth at bedtime., Disp: 30 tablet, Rfl: 2 .  omeprazole (PRILOSEC) 40 MG capsule, Take by mouth., Disp: , Rfl:  .  ondansetron (ZOFRAN) 8 MG tablet, Take 1 tablet (8 mg total) by mouth every 8 (eight) hours as needed for nausea or vomiting., Disp: 20 tablet, Rfl: 1 .  Polyethylene Glycol 3350 (MIRALAX PO), Take 2 Scoops  by mouth daily., Disp: , Rfl:  .  Polyethylene Glycol 3350 (MIRALAX PO), Take by mouth as needed., Disp: , Rfl:  .  prasugrel (EFFIENT) 10 MG TABS tablet, Take 10 mg by mouth daily., Disp: , Rfl:  .  pregabalin (LYRICA) 150 MG capsule, Take 150 mg by mouth 2 (two) times daily as needed (Nerve pain). , Disp: , Rfl:  .  prochlorperazine (COMPAZINE) 10 MG tablet, Take 1 tablet (10 mg total) by mouth every 6 (six) hours as needed for nausea or vomiting., Disp: 10 tablet, Rfl: 0 .  rivaroxaban (XARELTO) 10 MG TABS tablet, Take 1 tablet (10 mg total) by mouth daily., Disp: 30 tablet, Rfl: 0 .  sennosides-docusate sodium (SENOKOT-S) 8.6-50 MG tablet, Take 2 tablets by mouth daily., Disp: 30 tablet, Rfl: 1 .  traMADol (ULTRAM) 50 MG tablet, Take 50 mg by mouth 3 (three) times daily as needed., Disp: , Rfl:   Allergies: No Known Allergies  Past Medical History, Surgical history, Social history, and Family History were reviewed and updated.  Review of Systems: Review of Systems  Constitutional: Negative.   HENT:  Negative.   Eyes: Negative.   Respiratory: Negative.   Cardiovascular: Negative.   Gastrointestinal: Negative.   Endocrine: Negative.   Genitourinary: Negative.    Musculoskeletal: Positive for arthralgias.  Skin: Negative.   Neurological: Negative.   Hematological: Negative.   Psychiatric/Behavioral: Negative.     Physical Exam:  weight is 196 lb 4 oz (89 kg). His oral temperature is 98.3 F (36.8 C). His blood pressure is 110/53 (abnormal) and his pulse is 59 (abnormal). His respiration is 18 and oxygen saturation is 99%.   Wt Readings from Last 3 Encounters:  09/08/20 196 lb 4 oz (89 kg)  07/22/20 209 lb 8 oz (95 kg)  07/21/20 214 lb (97.1 kg)    Physical Exam Vitals reviewed.  HENT:     Head: Normocephalic and atraumatic.  Eyes:     Pupils: Pupils are equal, round, and reactive to light.  Cardiovascular:     Rate and Rhythm: Normal rate and regular rhythm.     Heart  sounds: Normal heart sounds.  Pulmonary:     Effort: Pulmonary effort is normal.     Breath sounds: Normal breath sounds.  Abdominal:     General: Bowel sounds are normal.     Palpations: Abdomen is soft.  Musculoskeletal:        General: No tenderness or deformity. Normal range of motion.     Cervical back: Normal range of motion.     Comments:  He has the left hip surgical scar that is healed.  He has some decreased range of motion of the left hip area.  He has some discomfort in the left shoulder blade.  He does seem to have little bit of better range of motion in this area.  Lymphadenopathy:     Cervical: No cervical adenopathy.  Skin:    General: Skin is warm and dry.     Findings: No erythema or rash.  Neurological:     Mental Status: He is alert and oriented to person, place, and time.  Psychiatric:        Behavior: Behavior normal.        Thought Content: Thought content normal.        Judgment: Judgment normal.    Lab Results  Component Value Date   WBC 2.4 (L) 09/08/2020   HGB 10.4 (L) 09/08/2020   HCT 32.5 (L) 09/08/2020   MCV 91.8 09/08/2020   PLT 171 09/08/2020     Chemistry      Component Value Date/Time   NA 141 09/01/2020 0810   K 4.2 09/01/2020 0810   CL 110 09/01/2020 0810   CO2 25 09/01/2020 0810   BUN 12 09/01/2020 0810   CREATININE 0.76 09/01/2020 0810   CREATININE 0.83 08/18/2020 0814      Component Value Date/Time   CALCIUM 7.7 (L) 09/01/2020 0810   ALKPHOS 253 (H) 09/01/2020 0810   AST 20 09/01/2020 0810   AST 19 08/18/2020 0814   ALT 44 09/01/2020 0810   ALT 56 (H) 08/18/2020 0814   BILITOT 0.4 09/01/2020 0810   BILITOT 0.7 08/18/2020 0814      Impression and Plan: Mr. Gosling is a very nice 69 year old white male.  He has kappa light chain myeloma.  I was supposed not to surprised by this as light chain myeloma does tend to involve the bones.  He has had radiation therapy.    We will proceed with cycle #2 of chemotherapy.  Again, we  will cut the duration of the Revlimid back a little bit.  We will see where the light chain level is.  It is surprised that he really does not have a lot of light chain.  As such, our ultimate measure of response to might be another bone marrow biopsy.  He will get his Delton See today.  Hopefully, we will still be able to get him to transplant.  We will plan to get him back in another month for his third cycle of treatment.  Volanda Napoleon, MD 11/9/20218:02 AM

## 2020-09-08 NOTE — Patient Instructions (Signed)
Daratumumab injection What is this medicine? DARATUMUMAB (dar a toom ue mab) is a monoclonal antibody. It is used to treat multiple myeloma. This medicine may be used for other purposes; ask your health care provider or pharmacist if you have questions. COMMON BRAND NAME(S): DARZALEX What should I tell my health care provider before I take this medicine? They need to know if you have any of these conditions:  infection (especially a virus infection such as chickenpox, herpes, or hepatitis B virus)  lung or breathing disease  an unusual or allergic reaction to daratumumab, other medicines, foods, dyes, or preservatives  pregnant or trying to get pregnant  breast-feeding How should I use this medicine? This medicine is for infusion into a vein. It is given by a health care professional in a hospital or clinic setting. Talk to your pediatrician regarding the use of this medicine in children. Special care may be needed. Overdosage: If you think you have taken too much of this medicine contact a poison control center or emergency room at once. NOTE: This medicine is only for you. Do not share this medicine with others. What if I miss a dose? Keep appointments for follow-up doses as directed. It is important not to miss your dose. Call your doctor or health care professional if you are unable to keep an appointment. What may interact with this medicine? Interactions have not been studied. This list may not describe all possible interactions. Give your health care provider a list of all the medicines, herbs, non-prescription drugs, or dietary supplements you use. Also tell them if you smoke, drink alcohol, or use illegal drugs. Some items may interact with your medicine. What should I watch for while using this medicine? This drug may make you feel generally unwell. Report any side effects. Continue your course of treatment even though you feel ill unless your doctor tells you to stop. This  medicine can cause serious allergic reactions. To reduce your risk you may need to take medicine before treatment with this medicine. Take your medicine as directed. This medicine can affect the results of blood tests to match your blood type. These changes can last for up to 6 months after the final dose. Your healthcare provider will do blood tests to match your blood type before you start treatment. Tell all of your healthcare providers that you are being treated with this medicine before receiving a blood transfusion. This medicine can affect the results of some tests used to determine treatment response; extra tests may be needed to evaluate response. Do not become pregnant while taking this medicine or for 3 months after stopping it. Women should inform their doctor if they wish to become pregnant or think they might be pregnant. There is a potential for serious side effects to an unborn child. Talk to your health care professional or pharmacist for more information. What side effects may I notice from receiving this medicine? Side effects that you should report to your doctor or health care professional as soon as possible:  allergic reactions like skin rash, itching or hives, swelling of the face, lips, or tongue  breathing problems  chills  cough  dizziness  feeling faint or lightheaded  headache  low blood counts - this medicine may decrease the number of white blood cells, red blood cells and platelets. You may be at increased risk for infections and bleeding.  nausea, vomiting  shortness of breath  signs of decreased platelets or bleeding - bruising, pinpoint red spots on  the skin, black, tarry stools, blood in the urine  signs of decreased red blood cells - unusually weak or tired, feeling faint or lightheaded, falls  signs of infection - fever or chills, cough, sore throat, pain or difficulty passing urine  signs and symptoms of liver injury like dark yellow or brown  urine; general ill feeling or flu-like symptoms; light-colored stools; loss of appetite; right upper belly pain; unusually weak or tired; yellowing of the eyes or skin Side effects that usually do not require medical attention (report to your doctor or health care professional if they continue or are bothersome):  back pain  constipation  diarrhea  joint pain  muscle cramps  pain, tingling, numbness in the hands or feet  swelling of the ankles, feet, hands  tiredness  trouble sleeping This list may not describe all possible side effects. Call your doctor for medical advice about side effects. You may report side effects to FDA at 1-800-FDA-1088. Where should I keep my medicine? This drug is given in a hospital or clinic and will not be stored at home. NOTE: This sheet is a summary. It may not cover all possible information. If you have questions about this medicine, talk to your doctor, pharmacist, or health care provider.  2020 Elsevier/Gold Standard (2019-06-25 18:10:54)  Bortezomib injection What is this medicine? BORTEZOMIB (bor TEZ oh mib) is a medicine that targets proteins in cancer cells and stops the cancer cells from growing. It is used to treat multiple myeloma and mantle-cell lymphoma. This medicine may be used for other purposes; ask your health care provider or pharmacist if you have questions. COMMON BRAND NAME(S): Velcade What should I tell my health care provider before I take this medicine? They need to know if you have any of these conditions:  diabetes  heart disease  irregular heartbeat  liver disease  on hemodialysis  low blood counts, like low white blood cells, platelets, or hemoglobin  peripheral neuropathy  taking medicine for blood pressure  an unusual or allergic reaction to bortezomib, mannitol, boron, other medicines, foods, dyes, or preservatives  pregnant or trying to get pregnant  breast-feeding How should I use this  medicine? This medicine is for injection into a vein or for injection under the skin. It is given by a health care professional in a hospital or clinic setting. Talk to your pediatrician regarding the use of this medicine in children. Special care may be needed. Overdosage: If you think you have taken too much of this medicine contact a poison control center or emergency room at once. NOTE: This medicine is only for you. Do not share this medicine with others. What if I miss a dose? It is important not to miss your dose. Call your doctor or health care professional if you are unable to keep an appointment. What may interact with this medicine? This medicine may interact with the following medications:  ketoconazole  rifampin  ritonavir  St. John's Wort This list may not describe all possible interactions. Give your health care provider a list of all the medicines, herbs, non-prescription drugs, or dietary supplements you use. Also tell them if you smoke, drink alcohol, or use illegal drugs. Some items may interact with your medicine. What should I watch for while using this medicine? You may get drowsy or dizzy. Do not drive, use machinery, or do anything that needs mental alertness until you know how this medicine affects you. Do not stand or sit up quickly, especially if you are  an older patient. This reduces the risk of dizzy or fainting spells. In some cases, you may be given additional medicines to help with side effects. Follow all directions for their use. Call your doctor or health care professional for advice if you get a fever, chills or sore throat, or other symptoms of a cold or flu. Do not treat yourself. This drug decreases your body's ability to fight infections. Try to avoid being around people who are sick. This medicine may increase your risk to bruise or bleed. Call your doctor or health care professional if you notice any unusual bleeding. You may need blood work done while  you are taking this medicine. In some patients, this medicine may cause a serious brain infection that may cause death. If you have any problems seeing, thinking, speaking, walking, or standing, tell your doctor right away. If you cannot reach your doctor, urgently seek other source of medical care. Check with your doctor or health care professional if you get an attack of severe diarrhea, nausea and vomiting, or if you sweat a lot. The loss of too much body fluid can make it dangerous for you to take this medicine. Do not become pregnant while taking this medicine or for at least 7 months after stopping it. Women should inform their doctor if they wish to become pregnant or think they might be pregnant. Men should not father a child while taking this medicine and for at least 4 months after stopping it. There is a potential for serious side effects to an unborn child. Talk to your health care professional or pharmacist for more information. Do not breast-feed an infant while taking this medicine or for 2 months after stopping it. This medicine may interfere with the ability to have a child. You should talk with your doctor or health care professional if you are concerned about your fertility. What side effects may I notice from receiving this medicine? Side effects that you should report to your doctor or health care professional as soon as possible:  allergic reactions like skin rash, itching or hives, swelling of the face, lips, or tongue  breathing problems  changes in hearing  changes in vision  fast, irregular heartbeat  feeling faint or lightheaded, falls  pain, tingling, numbness in the hands or feet  right upper belly pain  seizures  swelling of the ankles, feet, hands  unusual bleeding or bruising  unusually weak or tired  vomiting  yellowing of the eyes or skin Side effects that usually do not require medical attention (report to your doctor or health care professional  if they continue or are bothersome):  changes in emotions or moods  constipation  diarrhea  loss of appetite  headache  irritation at site where injected  nausea This list may not describe all possible side effects. Call your doctor for medical advice about side effects. You may report side effects to FDA at 1-800-FDA-1088. Where should I keep my medicine? This drug is given in a hospital or clinic and will not be stored at home. NOTE: This sheet is a summary. It may not cover all possible information. If you have questions about this medicine, talk to your doctor, pharmacist, or health care provider.  2020 Elsevier/Gold Standard (2018-02-26 16:29:31)

## 2020-09-08 NOTE — Progress Notes (Signed)
Pt. Refused to wait one hour post Darzalex.States that he "feels fine". Released stable and asymptomatic.

## 2020-09-09 LAB — IGG, IGA, IGM
IgA: 144 mg/dL (ref 61–437)
IgG (Immunoglobin G), Serum: 426 mg/dL — ABNORMAL LOW (ref 603–1613)
IgM (Immunoglobulin M), Srm: 14 mg/dL — ABNORMAL LOW (ref 20–172)

## 2020-09-09 LAB — KAPPA/LAMBDA LIGHT CHAINS
Kappa free light chain: 13.3 mg/L (ref 3.3–19.4)
Kappa, lambda light chain ratio: 3.5 — ABNORMAL HIGH (ref 0.26–1.65)
Lambda free light chains: 3.8 mg/L — ABNORMAL LOW (ref 5.7–26.3)

## 2020-09-09 LAB — PROTEIN ELECTROPHORESIS, SERUM, WITH REFLEX
A/G Ratio: 1.4 (ref 0.7–1.7)
Albumin ELP: 2.9 g/dL (ref 2.9–4.4)
Alpha-1-Globulin: 0.3 g/dL (ref 0.0–0.4)
Alpha-2-Globulin: 0.8 g/dL (ref 0.4–1.0)
Beta Globulin: 0.7 g/dL (ref 0.7–1.3)
Gamma Globulin: 0.4 g/dL (ref 0.4–1.8)
Globulin, Total: 2.1 g/dL — ABNORMAL LOW (ref 2.2–3.9)
Total Protein ELP: 5 g/dL — ABNORMAL LOW (ref 6.0–8.5)

## 2020-09-10 ENCOUNTER — Other Ambulatory Visit: Payer: Self-pay

## 2020-09-10 ENCOUNTER — Encounter: Payer: Self-pay | Admitting: *Deleted

## 2020-09-10 ENCOUNTER — Ambulatory Visit
Admission: RE | Admit: 2020-09-10 | Discharge: 2020-09-10 | Disposition: A | Discharge status: Home / Self Care | Payer: BC Managed Care – PPO | Source: Ambulatory Visit | Attending: Radiation Oncology | Admitting: Radiation Oncology

## 2020-09-10 ENCOUNTER — Encounter: Payer: Self-pay | Admitting: Radiation Oncology

## 2020-09-10 DIAGNOSIS — Z923 Personal history of irradiation: Secondary | ICD-10-CM | POA: Insufficient documentation

## 2020-09-10 DIAGNOSIS — C9000 Multiple myeloma not having achieved remission: Secondary | ICD-10-CM | POA: Diagnosis present

## 2020-09-10 DIAGNOSIS — Z79899 Other long term (current) drug therapy: Secondary | ICD-10-CM | POA: Insufficient documentation

## 2020-09-10 DIAGNOSIS — S42102A Fracture of unspecified part of scapula, left shoulder, initial encounter for closed fracture: Secondary | ICD-10-CM | POA: Diagnosis not present

## 2020-09-10 DIAGNOSIS — Z7982 Long term (current) use of aspirin: Secondary | ICD-10-CM | POA: Insufficient documentation

## 2020-09-10 MED ORDER — TRAMADOL HCL 50 MG PO TABS: 50.0000 mg | ORAL_TABLET | Freq: Three times a day (TID) | ORAL | 0 refills | Status: DC | PRN

## 2020-09-10 NOTE — Progress Notes (Signed)
Patient here for a 1 month f/u with Dr. Sondra Come. Patient denies pain and is using a cane instead of a walker now. He reports his appetite is better. His weight remains constant.  Wt Readings from Last 3 Encounters:  09/10/20 201 lb (91.2 kg)  09/08/20 196 lb 4 oz (89 kg)  07/22/20 209 lb 8 oz (95 kg)    BP (!) 124/55 (BP Location: Right Arm, Patient Position: Sitting, Cuff Size: Normal)   Pulse (!) 54   Temp 97.8 F (36.6 C)   Resp 18   Ht 5\' 8"  (1.727 m)   Wt 201 lb (91.2 kg)   SpO2 100%   BMI 30.56 kg/m

## 2020-09-10 NOTE — Progress Notes (Incomplete)
  Patient Name: Bruce Smith MRN: [id number] DOB: 07-25-1951 Referring Physician: Gwenyth Allegra (Profile Not Attached) Date of Service: 08/10/2020 [facility], [place]                                                        End Of Treatment Note  Diagnoses: C90.00-Multiple myeloma not having achieved remission  Cancer Staging: Kappa light chain myeloma with extensive bone disease and pathologic fracture of the left scapula and left femur  Intent: Palliative  Radiation Treatment Dates: 07/28/2020 through 08/10/2020 Site Technique Total Dose (Gy) Dose per Fx (Gy) Completed Fx Beam Energies  Scapula, Left: Chest_Lt 3D 30/30 3 10/10 10X, 15X  Sacro-Iliac Joints: Pelvis 3D 30/30 3 10/10 10X, 15X  Femur Left: Ext_Lt Complex 30/30 3 10/10 10X, 15X   Narrative: The patient tolerated radiation therapy. He reported back pain that was rated 5/10 while sitting and 10/10 while standing, left hip pain with movement, sore throat, mild difficulty walking, diarrhea, mild dysuria at the start of urination, and mild fatigue. He also reported a weight loss of 25 pounds over a two -month period secondary to poor appetite and ongoing nausea. He had tried Zofran, Phenergan, Compazine, and Ativan without success. He also discontinued narcotics, which did not relieve the nausea. He denied shortness of breath, vomiting, swelling of left upper leg, and cough.  Of note, the patient underwent a bone marrow biopsy on 07/28/2020 that showed hypercellular marrow involved by plasma cell neoplasm (30-40%) as well as B-cell lymphocytosis.  Plan: The patient will follow-up with radiation oncology in one month.  ________________________________________________   Blair Promise, PhD, MD  This document serves as a record of services personally performed by  Gery Pray, MD. It was created on his behalf by Clerance Lav, a trained medical scribe. The creation of this record is based on the scribe's personal observations and the provider's statements to them. This document has been checked and approved by the attending provider.

## 2020-09-10 NOTE — Progress Notes (Signed)
Radiation Oncology         [phone] ________________________________  Name: Bruce Smith MRN: [id number]  Date: 09/10/2020  DOB: 14-Dec-1950  Follow-Up Visit Note  CC: Derrill Center., MD  Derrill Center., MD    ICD-10-CM   1. 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 Since the end of treatment, he has been under the care of Dr. [name] while undergoing treatment with Shawn Route, Velcade, Revlimid, and Decadron. Treatment was started on 08/06/2020 and he has completed two cycles thus far with relatively well tolerance.  On review of systems, he reports being able to ambulate with a cane instead of a walker. He also reports an improving appetite and stable weight. He denies leg pain. He also reports less pain in the left shoulder region and as well as improved mobility of his left arm and shoulder  ALLERGIES:  has No Known Allergies.  Meds: Current Outpatient Medications  Medication Sig Dispense Refill  . acetaminophen (TYLENOL) 500 MG tablet Take 1,000 mg by mouth every 6 (six) hours.    Marland Kitchen aspirin 81 MG EC tablet Take 81 mg by mouth daily. Swallow whole.    Marland Kitchen atorvastatin (LIPITOR) 20 MG tablet Take 20 mg by mouth daily.    Marland Kitchen atorvastatin (LIPITOR) 40 MG tablet TAKE 1/2 TABLET BY MOUTH DAILY FOR CHOLESTEROL    . baclofen (LIORESAL) 10 MG tablet Take 1 tablet (10 mg total) by mouth 3 (three) times daily. As needed for muscle spasm 50 tablet 0  . Coenzyme Q10  (CO Q-10) 200 MG CAPS Take 200 mg by mouth daily.    . cyclobenzaprine (FLEXERIL) 10 MG tablet Take 1 tablet (10 mg total) by mouth 3 (three) times daily as needed for muscle spasms. 30 tablet 0  . dexamethasone (DECADRON) 4 MG tablet Take 1 tablet (4 mg total) by mouth daily. Take for 3 days after chemotherapy treatment. Take with meals. 30 tablet 2  . dronabinol (MARINOL) 5 MG capsule Take 1 capsule (5 mg total) by mouth 2 (two) times daily before a meal. 60 capsule 0  . famciclovir (FAMVIR) 500 MG tablet Take 1 tablet (500 mg total) by mouth daily. 30 tablet 8  . hydrOXYzine (ATARAX/VISTARIL) 10 MG tablet Take 1 tablet (10 mg total) by mouth 3 (three) times daily as needed. 30 tablet 0  . lenalidomide (REVLIMID) 25 MG capsule Take 1 capsule (25 mg total) by mouth daily. 21 days on, then 7 days off. [employer] Auth #[id number] 21 capsule 0  . lisinopril (ZESTRIL) 5 MG tablet Take 7.5 mg by mouth daily.    Marland Kitchen LORazepam (ATIVAN) 0.5 MG tablet Take 1 tablet (0.5 mg total) by mouth every 6 (six) hours as needed for anxiety (nausea). 40 tablet 0  . metoprolol succinate (TOPROL-XL) 50 MG 24 hr tablet Take 50 mg by mouth daily.    Marland Kitchen morphine (MS CONTIN) 15 MG 12 hr tablet Take 1 tablet (15 mg total) by mouth  every 12 (twelve) hours. 60 tablet 0  . morphine (MSIR) 15 MG tablet Take 1 tablet (15 mg total) by mouth every 6 (six) hours as needed for severe pain. 90 tablet 0  . nitroGLYCERIN (NITROSTAT) 0.4 MG SL tablet Place 0.4 mg under the tongue every 5 (five) minutes as needed for chest pain.    Marland Kitchen OLANZapine (ZYPREXA) 10 MG tablet Take 1 tablet (10 mg total) by mouth at bedtime. 30 tablet 2  . omeprazole (PRILOSEC) 40 MG capsule Take by mouth.    . ondansetron (ZOFRAN) 8 MG tablet Take 1 tablet (8 mg total) by mouth every 8 (eight) hours as needed for nausea or vomiting. 20 tablet 1  . Polyethylene Glycol 3350 (MIRALAX PO) Take 2 Scoops by mouth daily.    . Polyethylene Glycol 3350 (MIRALAX PO) Take by mouth as  needed.    . prasugrel (EFFIENT) 10 MG TABS tablet Take 10 mg by mouth daily.    . pregabalin (LYRICA) 150 MG capsule Take 150 mg by mouth 2 (two) times daily as needed (Nerve pain).     . prochlorperazine (COMPAZINE) 10 MG tablet Take 1 tablet (10 mg total) by mouth every 6 (six) hours as needed for nausea or vomiting. 10 tablet 0  . rivaroxaban (XARELTO) 10 MG TABS tablet Take 1 tablet (10 mg total) by mouth daily. 30 tablet 0  . sennosides-docusate sodium (SENOKOT-S) 8.6-50 MG tablet Take 2 tablets by mouth daily. 30 tablet 1  . traMADol (ULTRAM) 50 MG tablet Take 1 tablet (50 mg total) by mouth 3 (three) times daily as needed for moderate pain. 90 tablet 0   No current facility-administered medications for this encounter.    Physical Findings: The patient is in no acute distress. Patient is alert and oriented.  height is 5\' 8"  (1.727 m) and weight is 201 lb (91.2 kg). His temperature is 97.8 F (36.6 C). His blood pressure is 124/55 (abnormal) and his pulse is 54 (abnormal). His respiration is 18 and oxygen saturation is 100%.   Lungs are clear to auscultation bilaterally. Heart has regular rate and rhythm. No palpable cervical, supraclavicular, or axillary adenopathy. Abdomen soft, non-tender, normal bowel sounds.   Lab Findings: Lab Results  Component Value Date   WBC 2.4 (L) 09/08/2020   HGB 10.4 (L) 09/08/2020   HCT 32.5 (L) 09/08/2020   MCV 91.8 09/08/2020   PLT 171 09/08/2020    Radiographic Findings: No results found.  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 He seems to have had good palliation of his symptoms with his radiation therapy. Patient is running low on his tramadol and I refilled this medication for him.  Plan: The patient is scheduled to follow-up with Dr. [name] on 10/06/2020 prior to undergoing cycle #3 of treatment. He  will follow up with radiation oncology on an as needed basis.  Man. Thank you patient  ____________________________________  Blair Promise, PhD, MD  This document serves as a record of services personally performed by Gery Pray, MD. It was created on his behalf by Clerance Lav, a trained medical scribe. The creation of this record is based on the scribe's personal observations and the provider's statements to them. This document has been checked and approved by the attending provider.

## 2020-09-14 ENCOUNTER — Other Ambulatory Visit: Payer: Self-pay | Admitting: *Deleted

## 2020-09-14 DIAGNOSIS — C9000 Multiple myeloma not having achieved remission: Secondary | ICD-10-CM

## 2020-09-14 DIAGNOSIS — C419 Malignant neoplasm of bone and articular cartilage, unspecified: Secondary | ICD-10-CM

## 2020-09-15 ENCOUNTER — Inpatient Hospital Stay: Payer: BC Managed Care – PPO

## 2020-09-15 ENCOUNTER — Other Ambulatory Visit: Payer: Self-pay

## 2020-09-15 VITALS — BP 128/58 | HR 55 | Temp 98.3°F | Resp 17

## 2020-09-15 DIAGNOSIS — C9000 Multiple myeloma not having achieved remission: Secondary | ICD-10-CM | POA: Diagnosis not present

## 2020-09-15 DIAGNOSIS — C419 Malignant neoplasm of bone and articular cartilage, unspecified: Secondary | ICD-10-CM

## 2020-09-15 LAB — CMP (CANCER CENTER ONLY)
ALT: 20 U/L (ref 0–44)
AST: 12 U/L — ABNORMAL LOW (ref 15–41)
Albumin: 3.5 g/dL (ref 3.5–5.0)
Alkaline Phosphatase: 255 U/L — ABNORMAL HIGH (ref 38–126)
Anion gap: 5 (ref 5–15)
BUN: 11 mg/dL (ref 8–23)
CO2: 25 mmol/L (ref 22–32)
Calcium: 8 mg/dL — ABNORMAL LOW (ref 8.9–10.3)
Chloride: 110 mmol/L (ref 98–111)
Creatinine: 0.71 mg/dL (ref 0.61–1.24)
GFR, Estimated: >60 mL/min (ref >60)
Glucose, Bld: 103 mg/dL — ABNORMAL HIGH (ref 70–99)
Potassium: 4.3 mmol/L (ref 3.5–5.1)
Sodium: 140 mmol/L (ref 135–145)
Total Bilirubin: 0.3 mg/dL (ref 0.3–1.2)
Total Protein: 5.2 g/dL — ABNORMAL LOW (ref 6.5–8.1)

## 2020-09-15 LAB — CBC WITH DIFFERENTIAL (CANCER CENTER ONLY)
Abs Immature Granulocytes: 0.03 10*3/uL (ref 0.00–0.07)
Basophils Absolute: 0.1 10*3/uL (ref 0.0–0.1)
Basophils Relative: 1 %
Eosinophils Absolute: 0.1 10*3/uL (ref 0.0–0.5)
Eosinophils Relative: 2 %
HCT: 32.8 % — ABNORMAL LOW (ref 39.0–52.0)
Hemoglobin: 10.5 g/dL — ABNORMAL LOW (ref 13.0–17.0)
Immature Granulocytes: 1 %
Lymphocytes Relative: 5 %
Lymphs Abs: 0.3 10*3/uL — ABNORMAL LOW (ref 0.7–4.0)
MCH: 29.8 pg (ref 26.0–34.0)
MCHC: 32 g/dL (ref 30.0–36.0)
MCV: 93.2 fL (ref 80.0–100.0)
Monocytes Absolute: 0.7 10*3/uL (ref 0.1–1.0)
Monocytes Relative: 14 %
Neutro Abs: 3.8 10*3/uL (ref 1.7–7.7)
Neutrophils Relative %: 77 %
Platelet Count: 147 10*3/uL — ABNORMAL LOW (ref 150–400)
RBC: 3.52 MIL/uL — ABNORMAL LOW (ref 4.22–5.81)
RDW: 16.7 % — ABNORMAL HIGH (ref 11.5–15.5)
WBC Count: 4.9 10*3/uL (ref 4.0–10.5)
nRBC: 0 % (ref 0.0–0.2)

## 2020-09-15 MED ORDER — FAMOTIDINE 20 MG PO TABS
40.0000 mg | ORAL_TABLET | Freq: Once | ORAL | Status: AC
  Administered 2020-09-15: 40 mg via ORAL

## 2020-09-15 MED ORDER — DARATUMUMAB-HYALURONIDASE-FIHJ 1800-30000 MG-UT/15ML ~~LOC~~ SOLN
1800.0000 mg | Freq: Once | SUBCUTANEOUS | Status: AC
  Administered 2020-09-15: 1800 mg via SUBCUTANEOUS
  Filled 2020-09-15: qty 15

## 2020-09-15 MED ORDER — ACETAMINOPHEN 325 MG PO TABS
ORAL_TABLET | ORAL | Status: AC
  Filled 2020-09-15: qty 2

## 2020-09-15 MED ORDER — DIPHENHYDRAMINE HCL 25 MG PO CAPS
ORAL_CAPSULE | ORAL | Status: AC
  Filled 2020-09-15: qty 2

## 2020-09-15 MED ORDER — DEXAMETHASONE 4 MG PO TABS
ORAL_TABLET | ORAL | Status: AC
  Filled 2020-09-15: qty 5

## 2020-09-15 MED ORDER — DEXAMETHASONE 4 MG PO TABS
20.0000 mg | ORAL_TABLET | Freq: Once | ORAL | Status: AC
  Administered 2020-09-15: 20 mg via ORAL

## 2020-09-15 MED ORDER — BORTEZOMIB CHEMO SQ INJECTION 3.5 MG (2.5MG/ML)
1.3000 mg/m2 | Freq: Once | INTRAMUSCULAR | Status: AC
  Administered 2020-09-15: 2.75 mg via SUBCUTANEOUS
  Filled 2020-09-15: qty 1.1

## 2020-09-15 MED ORDER — TRIAMTERENE-HCTZ 75-50 MG PO TABS: 1.0000 | ORAL_TABLET | Freq: Every day | ORAL | 4 refills | Status: DC

## 2020-09-15 MED ORDER — DIPHENHYDRAMINE HCL 25 MG PO CAPS
50.0000 mg | ORAL_CAPSULE | Freq: Once | ORAL | Status: AC
  Administered 2020-09-15: 50 mg via ORAL

## 2020-09-15 NOTE — Patient Instructions (Signed)
Daratumumab injection What is this medicine? DARATUMUMAB (dar a toom ue mab) is a monoclonal antibody. It is used to treat multiple myeloma. This medicine may be used for other purposes; ask your health care provider or pharmacist if you have questions. COMMON BRAND NAME(S): DARZALEX What should I tell my health care provider before I take this medicine? They need to know if you have any of these conditions:  infection (especially a virus infection such as chickenpox, herpes, or hepatitis B virus)  lung or breathing disease  an unusual or allergic reaction to daratumumab, other medicines, foods, dyes, or preservatives  pregnant or trying to get pregnant  breast-feeding How should I use this medicine? This medicine is for infusion into a vein. It is given by a health care professional in a hospital or clinic setting. Talk to your pediatrician regarding the use of this medicine in children. Special care may be needed. Overdosage: If you think you have taken too much of this medicine contact a poison control center or emergency room at once. NOTE: This medicine is only for you. Do not share this medicine with others. What if I miss a dose? Keep appointments for follow-up doses as directed. It is important not to miss your dose. Call your doctor or health care professional if you are unable to keep an appointment. What may interact with this medicine? Interactions have not been studied. This list may not describe all possible interactions. Give your health care provider a list of all the medicines, herbs, non-prescription drugs, or dietary supplements you use. Also tell them if you smoke, drink alcohol, or use illegal drugs. Some items may interact with your medicine. What should I watch for while using this medicine? This drug may make you feel generally unwell. Report any side effects. Continue your course of treatment even though you feel ill unless your doctor tells you to stop. This  medicine can cause serious allergic reactions. To reduce your risk you may need to take medicine before treatment with this medicine. Take your medicine as directed. This medicine can affect the results of blood tests to match your blood type. These changes can last for up to 6 months after the final dose. Your healthcare provider will do blood tests to match your blood type before you start treatment. Tell all of your healthcare providers that you are being treated with this medicine before receiving a blood transfusion. This medicine can affect the results of some tests used to determine treatment response; extra tests may be needed to evaluate response. Do not become pregnant while taking this medicine or for 3 months after stopping it. Women should inform their doctor if they wish to become pregnant or think they might be pregnant. There is a potential for serious side effects to an unborn child. Talk to your health care professional or pharmacist for more information. What side effects may I notice from receiving this medicine? Side effects that you should report to your doctor or health care professional as soon as possible:  allergic reactions like skin rash, itching or hives, swelling of the face, lips, or tongue  breathing problems  chills  cough  dizziness  feeling faint or lightheaded  headache  low blood counts - this medicine may decrease the number of white blood cells, red blood cells and platelets. You may be at increased risk for infections and bleeding.  nausea, vomiting  shortness of breath  signs of decreased platelets or bleeding - bruising, pinpoint red spots on  the skin, black, tarry stools, blood in the urine  signs of decreased red blood cells - unusually weak or tired, feeling faint or lightheaded, falls  signs of infection - fever or chills, cough, sore throat, pain or difficulty passing urine  signs and symptoms of liver injury like dark yellow or brown  urine; general ill feeling or flu-like symptoms; light-colored stools; loss of appetite; right upper belly pain; unusually weak or tired; yellowing of the eyes or skin Side effects that usually do not require medical attention (report to your doctor or health care professional if they continue or are bothersome):  back pain  constipation  diarrhea  joint pain  muscle cramps  pain, tingling, numbness in the hands or feet  swelling of the ankles, feet, hands  tiredness  trouble sleeping This list may not describe all possible side effects. Call your doctor for medical advice about side effects. You may report side effects to FDA at 1-800-FDA-1088. Where should I keep my medicine? This drug is given in a hospital or clinic and will not be stored at home. NOTE: This sheet is a summary. It may not cover all possible information. If you have questions about this medicine, talk to your doctor, pharmacist, or health care provider.  2020 Elsevier/Gold Standard (2019-06-25 18:10:54) Bortezomib injection What is this medicine? BORTEZOMIB (bor TEZ oh mib) is a medicine that targets proteins in cancer cells and stops the cancer cells from growing. It is used to treat multiple myeloma and mantle-cell lymphoma. This medicine may be used for other purposes; ask your health care provider or pharmacist if you have questions. COMMON BRAND NAME(S): Velcade What should I tell my health care provider before I take this medicine? They need to know if you have any of these conditions:  diabetes  heart disease  irregular heartbeat  liver disease  on hemodialysis  low blood counts, like low white blood cells, platelets, or hemoglobin  peripheral neuropathy  taking medicine for blood pressure  an unusual or allergic reaction to bortezomib, mannitol, boron, other medicines, foods, dyes, or preservatives  pregnant or trying to get pregnant  breast-feeding How should I use this  medicine? This medicine is for injection into a vein or for injection under the skin. It is given by a health care professional in a hospital or clinic setting. Talk to your pediatrician regarding the use of this medicine in children. Special care may be needed. Overdosage: If you think you have taken too much of this medicine contact a poison control center or emergency room at once. NOTE: This medicine is only for you. Do not share this medicine with others. What if I miss a dose? It is important not to miss your dose. Call your doctor or health care professional if you are unable to keep an appointment. What may interact with this medicine? This medicine may interact with the following medications:  ketoconazole  rifampin  ritonavir  St. John's Wort This list may not describe all possible interactions. Give your health care provider a list of all the medicines, herbs, non-prescription drugs, or dietary supplements you use. Also tell them if you smoke, drink alcohol, or use illegal drugs. Some items may interact with your medicine. What should I watch for while using this medicine? You may get drowsy or dizzy. Do not drive, use machinery, or do anything that needs mental alertness until you know how this medicine affects you. Do not stand or sit up quickly, especially if you are an  older patient. This reduces the risk of dizzy or fainting spells. In some cases, you may be given additional medicines to help with side effects. Follow all directions for their use. Call your doctor or health care professional for advice if you get a fever, chills or sore throat, or other symptoms of a cold or flu. Do not treat yourself. This drug decreases your body's ability to fight infections. Try to avoid being around people who are sick. This medicine may increase your risk to bruise or bleed. Call your doctor or health care professional if you notice any unusual bleeding. You may need blood work done while  you are taking this medicine. In some patients, this medicine may cause a serious brain infection that may cause death. If you have any problems seeing, thinking, speaking, walking, or standing, tell your doctor right away. If you cannot reach your doctor, urgently seek other source of medical care. Check with your doctor or health care professional if you get an attack of severe diarrhea, nausea and vomiting, or if you sweat a lot. The loss of too much body fluid can make it dangerous for you to take this medicine. Do not become pregnant while taking this medicine or for at least 7 months after stopping it. Women should inform their doctor if they wish to become pregnant or think they might be pregnant. Men should not father a child while taking this medicine and for at least 4 months after stopping it. There is a potential for serious side effects to an unborn child. Talk to your health care professional or pharmacist for more information. Do not breast-feed an infant while taking this medicine or for 2 months after stopping it. This medicine may interfere with the ability to have a child. You should talk with your doctor or health care professional if you are concerned about your fertility. What side effects may I notice from receiving this medicine? Side effects that you should report to your doctor or health care professional as soon as possible:  allergic reactions like skin rash, itching or hives, swelling of the face, lips, or tongue  breathing problems  changes in hearing  changes in vision  fast, irregular heartbeat  feeling faint or lightheaded, falls  pain, tingling, numbness in the hands or feet  right upper belly pain  seizures  swelling of the ankles, feet, hands  unusual bleeding or bruising  unusually weak or tired  vomiting  yellowing of the eyes or skin Side effects that usually do not require medical attention (report to your doctor or health care professional  if they continue or are bothersome):  changes in emotions or moods  constipation  diarrhea  loss of appetite  headache  irritation at site where injected  nausea This list may not describe all possible side effects. Call your doctor for medical advice about side effects. You may report side effects to FDA at 1-800-FDA-1088. Where should I keep my medicine? This drug is given in a hospital or clinic and will not be stored at home. NOTE: This sheet is a summary. It may not cover all possible information. If you have questions about this medicine, talk to your doctor, pharmacist, or health care provider.  2020 Elsevier/Gold Standard (2018-02-26 16:29:31)

## 2020-09-15 NOTE — Progress Notes (Signed)
Patient c/o swelling of ankles. He was added to Dr. Antonieta Pert schedule and was seen today.

## 2020-09-21 ENCOUNTER — Other Ambulatory Visit: Payer: Self-pay | Admitting: *Deleted

## 2020-09-21 DIAGNOSIS — C9000 Multiple myeloma not having achieved remission: Secondary | ICD-10-CM

## 2020-09-21 MED ORDER — LENALIDOMIDE 25 MG PO CAPS: 25.0000 mg | ORAL_CAPSULE | Freq: Every day | ORAL | 0 refills | Status: DC

## 2020-09-22 ENCOUNTER — Inpatient Hospital Stay: Payer: BC Managed Care – PPO

## 2020-09-22 ENCOUNTER — Other Ambulatory Visit: Payer: Self-pay

## 2020-09-22 VITALS — BP 122/46 | HR 70 | Temp 98.0°F | Resp 17

## 2020-09-22 DIAGNOSIS — C9000 Multiple myeloma not having achieved remission: Secondary | ICD-10-CM

## 2020-09-22 LAB — COMPREHENSIVE METABOLIC PANEL
ALT: 18 U/L (ref 0–44)
AST: 9 U/L — ABNORMAL LOW (ref 15–41)
Albumin: 3.6 g/dL (ref 3.5–5.0)
Alkaline Phosphatase: 200 U/L — ABNORMAL HIGH (ref 38–126)
Anion gap: 7 (ref 5–15)
BUN: 15 mg/dL (ref 8–23)
CO2: 26 mmol/L (ref 22–32)
Calcium: 7.3 mg/dL — ABNORMAL LOW (ref 8.9–10.3)
Chloride: 102 mmol/L (ref 98–111)
Creatinine, Ser: 0.88 mg/dL (ref 0.61–1.24)
GFR, Estimated: >60 mL/min (ref >60)
Glucose, Bld: 104 mg/dL — ABNORMAL HIGH (ref 70–99)
Potassium: 3.7 mmol/L (ref 3.5–5.1)
Sodium: 135 mmol/L (ref 135–145)
Total Bilirubin: 0.5 mg/dL (ref 0.3–1.2)
Total Protein: 5.6 g/dL — ABNORMAL LOW (ref 6.5–8.1)

## 2020-09-22 LAB — CBC WITH DIFFERENTIAL (CANCER CENTER ONLY)
Abs Immature Granulocytes: 0.04 K/uL (ref 0.00–0.07)
Basophils Absolute: 0 K/uL (ref 0.0–0.1)
Basophils Relative: 0 %
Eosinophils Absolute: 0.3 K/uL (ref 0.0–0.5)
Eosinophils Relative: 7 %
HCT: 33.1 % — ABNORMAL LOW (ref 39.0–52.0)
Hemoglobin: 10.7 g/dL — ABNORMAL LOW (ref 13.0–17.0)
Immature Granulocytes: 1 %
Lymphocytes Relative: 2 %
Lymphs Abs: 0.1 K/uL — ABNORMAL LOW (ref 0.7–4.0)
MCH: 29.6 pg (ref 26.0–34.0)
MCHC: 32.3 g/dL (ref 30.0–36.0)
MCV: 91.4 fL (ref 80.0–100.0)
Monocytes Absolute: 0.6 K/uL (ref 0.1–1.0)
Monocytes Relative: 12 %
Neutro Abs: 3.9 K/uL (ref 1.7–7.7)
Neutrophils Relative %: 78 %
Platelet Count: 132 K/uL — ABNORMAL LOW (ref 150–400)
RBC: 3.62 MIL/uL — ABNORMAL LOW (ref 4.22–5.81)
RDW: 16.2 % — ABNORMAL HIGH (ref 11.5–15.5)
WBC Count: 4.9 K/uL (ref 4.0–10.5)
nRBC: 0 % (ref 0.0–0.2)

## 2020-09-22 MED ORDER — BORTEZOMIB CHEMO SQ INJECTION 3.5 MG (2.5MG/ML)
1.3000 mg/m2 | Freq: Once | INTRAMUSCULAR | Status: AC
  Administered 2020-09-22: 2.75 mg via SUBCUTANEOUS
  Filled 2020-09-22: qty 1.1

## 2020-09-22 MED ORDER — ACETAMINOPHEN 325 MG PO TABS
650.0000 mg | ORAL_TABLET | Freq: Once | ORAL | Status: AC
  Administered 2020-09-22: 650 mg via ORAL

## 2020-09-22 MED ORDER — DARATUMUMAB-HYALURONIDASE-FIHJ 1800-30000 MG-UT/15ML ~~LOC~~ SOLN
1800.0000 mg | Freq: Once | SUBCUTANEOUS | Status: AC
  Administered 2020-09-22: 1800 mg via SUBCUTANEOUS
  Filled 2020-09-22: qty 15

## 2020-09-22 MED ORDER — DIPHENHYDRAMINE HCL 25 MG PO CAPS
50.0000 mg | ORAL_CAPSULE | Freq: Once | ORAL | Status: AC
  Administered 2020-09-22: 50 mg via ORAL

## 2020-09-22 MED ORDER — DEXAMETHASONE 4 MG PO TABS
ORAL_TABLET | ORAL | Status: AC
  Filled 2020-09-22: qty 5

## 2020-09-22 MED ORDER — DEXAMETHASONE 4 MG PO TABS
20.0000 mg | ORAL_TABLET | Freq: Once | ORAL | Status: AC
  Administered 2020-09-22: 20 mg via ORAL

## 2020-09-22 MED ORDER — DIPHENHYDRAMINE HCL 25 MG PO CAPS
ORAL_CAPSULE | ORAL | Status: AC
  Filled 2020-09-22: qty 2

## 2020-09-22 MED ORDER — ACETAMINOPHEN 325 MG PO TABS
ORAL_TABLET | ORAL | Status: AC
  Filled 2020-09-22: qty 2

## 2020-09-22 MED ORDER — FAMOTIDINE 20 MG PO TABS
40.0000 mg | ORAL_TABLET | Freq: Once | ORAL | Status: AC
  Administered 2020-09-22: 40 mg via ORAL

## 2020-09-22 NOTE — Patient Instructions (Signed)
Daratumumab injection What is this medicine? DARATUMUMAB (dar a toom ue mab) is a monoclonal antibody. It is used to treat multiple myeloma. This medicine may be used for other purposes; ask your health care provider or pharmacist if you have questions. COMMON BRAND NAME(S): DARZALEX What should I tell my health care provider before I take this medicine? They need to know if you have any of these conditions:  infection (especially a virus infection such as chickenpox, herpes, or hepatitis B virus)  lung or breathing disease  an unusual or allergic reaction to daratumumab, other medicines, foods, dyes, or preservatives  pregnant or trying to get pregnant  breast-feeding How should I use this medicine? This medicine is for infusion into a vein. It is given by a health care professional in a hospital or clinic setting. Talk to your pediatrician regarding the use of this medicine in children. Special care may be needed. Overdosage: If you think you have taken too much of this medicine contact a poison control center or emergency room at once. NOTE: This medicine is only for you. Do not share this medicine with others. What if I miss a dose? Keep appointments for follow-up doses as directed. It is important not to miss your dose. Call your doctor or health care professional if you are unable to keep an appointment. What may interact with this medicine? Interactions have not been studied. This list may not describe all possible interactions. Give your health care provider a list of all the medicines, herbs, non-prescription drugs, or dietary supplements you use. Also tell them if you smoke, drink alcohol, or use illegal drugs. Some items may interact with your medicine. What should I watch for while using this medicine? This drug may make you feel generally unwell. Report any side effects. Continue your course of treatment even though you feel ill unless your doctor tells you to stop. This  medicine can cause serious allergic reactions. To reduce your risk you may need to take medicine before treatment with this medicine. Take your medicine as directed. This medicine can affect the results of blood tests to match your blood type. These changes can last for up to 6 months after the final dose. Your healthcare provider will do blood tests to match your blood type before you start treatment. Tell all of your healthcare providers that you are being treated with this medicine before receiving a blood transfusion. This medicine can affect the results of some tests used to determine treatment response; extra tests may be needed to evaluate response. Do not become pregnant while taking this medicine or for 3 months after stopping it. Women should inform their doctor if they wish to become pregnant or think they might be pregnant. There is a potential for serious side effects to an unborn child. Talk to your health care professional or pharmacist for more information. What side effects may I notice from receiving this medicine? Side effects that you should report to your doctor or health care professional as soon as possible:  allergic reactions like skin rash, itching or hives, swelling of the face, lips, or tongue  breathing problems  chills  cough  dizziness  feeling faint or lightheaded  headache  low blood counts - this medicine may decrease the number of white blood cells, red blood cells and platelets. You may be at increased risk for infections and bleeding.  nausea, vomiting  shortness of breath  signs of decreased platelets or bleeding - bruising, pinpoint red spots on  the skin, black, tarry stools, blood in the urine  signs of decreased red blood cells - unusually weak or tired, feeling faint or lightheaded, falls  signs of infection - fever or chills, cough, sore throat, pain or difficulty passing urine  signs and symptoms of liver injury like dark yellow or brown  urine; general ill feeling or flu-like symptoms; light-colored stools; loss of appetite; right upper belly pain; unusually weak or tired; yellowing of the eyes or skin Side effects that usually do not require medical attention (report to your doctor or health care professional if they continue or are bothersome):  back pain  constipation  diarrhea  joint pain  muscle cramps  pain, tingling, numbness in the hands or feet  swelling of the ankles, feet, hands  tiredness  trouble sleeping This list may not describe all possible side effects. Call your doctor for medical advice about side effects. You may report side effects to FDA at 1-800-FDA-1088. Where should I keep my medicine? This drug is given in a hospital or clinic and will not be stored at home. NOTE: This sheet is a summary. It may not cover all possible information. If you have questions about this medicine, talk to your doctor, pharmacist, or health care provider.  2020 Elsevier/Gold Standard (2019-06-25 18:10:54)  Bortezomib injection What is this medicine? BORTEZOMIB (bor TEZ oh mib) is a medicine that targets proteins in cancer cells and stops the cancer cells from growing. It is used to treat multiple myeloma and mantle-cell lymphoma. This medicine may be used for other purposes; ask your health care provider or pharmacist if you have questions. COMMON BRAND NAME(S): Velcade What should I tell my health care provider before I take this medicine? They need to know if you have any of these conditions:  diabetes  heart disease  irregular heartbeat  liver disease  on hemodialysis  low blood counts, like low white blood cells, platelets, or hemoglobin  peripheral neuropathy  taking medicine for blood pressure  an unusual or allergic reaction to bortezomib, mannitol, boron, other medicines, foods, dyes, or preservatives  pregnant or trying to get pregnant  breast-feeding How should I use this  medicine? This medicine is for injection into a vein or for injection under the skin. It is given by a health care professional in a hospital or clinic setting. Talk to your pediatrician regarding the use of this medicine in children. Special care may be needed. Overdosage: If you think you have taken too much of this medicine contact a poison control center or emergency room at once. NOTE: This medicine is only for you. Do not share this medicine with others. What if I miss a dose? It is important not to miss your dose. Call your doctor or health care professional if you are unable to keep an appointment. What may interact with this medicine? This medicine may interact with the following medications:  ketoconazole  rifampin  ritonavir  St. John's Wort This list may not describe all possible interactions. Give your health care provider a list of all the medicines, herbs, non-prescription drugs, or dietary supplements you use. Also tell them if you smoke, drink alcohol, or use illegal drugs. Some items may interact with your medicine. What should I watch for while using this medicine? You may get drowsy or dizzy. Do not drive, use machinery, or do anything that needs mental alertness until you know how this medicine affects you. Do not stand or sit up quickly, especially if you are  an older patient. This reduces the risk of dizzy or fainting spells. In some cases, you may be given additional medicines to help with side effects. Follow all directions for their use. Call your doctor or health care professional for advice if you get a fever, chills or sore throat, or other symptoms of a cold or flu. Do not treat yourself. This drug decreases your body's ability to fight infections. Try to avoid being around people who are sick. This medicine may increase your risk to bruise or bleed. Call your doctor or health care professional if you notice any unusual bleeding. You may need blood work done while  you are taking this medicine. In some patients, this medicine may cause a serious brain infection that may cause death. If you have any problems seeing, thinking, speaking, walking, or standing, tell your doctor right away. If you cannot reach your doctor, urgently seek other source of medical care. Check with your doctor or health care professional if you get an attack of severe diarrhea, nausea and vomiting, or if you sweat a lot. The loss of too much body fluid can make it dangerous for you to take this medicine. Do not become pregnant while taking this medicine or for at least 7 months after stopping it. Women should inform their doctor if they wish to become pregnant or think they might be pregnant. Men should not father a child while taking this medicine and for at least 4 months after stopping it. There is a potential for serious side effects to an unborn child. Talk to your health care professional or pharmacist for more information. Do not breast-feed an infant while taking this medicine or for 2 months after stopping it. This medicine may interfere with the ability to have a child. You should talk with your doctor or health care professional if you are concerned about your fertility. What side effects may I notice from receiving this medicine? Side effects that you should report to your doctor or health care professional as soon as possible:  allergic reactions like skin rash, itching or hives, swelling of the face, lips, or tongue  breathing problems  changes in hearing  changes in vision  fast, irregular heartbeat  feeling faint or lightheaded, falls  pain, tingling, numbness in the hands or feet  right upper belly pain  seizures  swelling of the ankles, feet, hands  unusual bleeding or bruising  unusually weak or tired  vomiting  yellowing of the eyes or skin Side effects that usually do not require medical attention (report to your doctor or health care professional  if they continue or are bothersome):  changes in emotions or moods  constipation  diarrhea  loss of appetite  headache  irritation at site where injected  nausea This list may not describe all possible side effects. Call your doctor for medical advice about side effects. You may report side effects to FDA at 1-800-FDA-1088. Where should I keep my medicine? This drug is given in a hospital or clinic and will not be stored at home. NOTE: This sheet is a summary. It may not cover all possible information. If you have questions about this medicine, talk to your doctor, pharmacist, or health care provider.  2020 Elsevier/Gold Standard (2018-02-26 16:29:31)

## 2020-09-28 ENCOUNTER — Other Ambulatory Visit: Payer: Self-pay | Admitting: *Deleted

## 2020-09-28 DIAGNOSIS — C9000 Multiple myeloma not having achieved remission: Secondary | ICD-10-CM

## 2020-09-29 ENCOUNTER — Encounter: Payer: Self-pay | Admitting: *Deleted

## 2020-09-29 ENCOUNTER — Inpatient Hospital Stay: Payer: BC Managed Care – PPO

## 2020-09-29 ENCOUNTER — Telehealth: Payer: Self-pay | Admitting: *Deleted

## 2020-09-29 ENCOUNTER — Other Ambulatory Visit: Payer: Self-pay

## 2020-09-29 VITALS — BP 117/43 | HR 65 | Temp 98.8°F | Resp 17

## 2020-09-29 DIAGNOSIS — C9000 Multiple myeloma not having achieved remission: Secondary | ICD-10-CM

## 2020-09-29 LAB — CMP (CANCER CENTER ONLY)
ALT: 17 U/L (ref 0–44)
AST: 11 U/L — ABNORMAL LOW (ref 15–41)
Albumin: 3.5 g/dL (ref 3.5–5.0)
Alkaline Phosphatase: 168 U/L — ABNORMAL HIGH (ref 38–126)
Anion gap: 8 (ref 5–15)
BUN: 22 mg/dL (ref 8–23)
CO2: 27 mmol/L (ref 22–32)
Calcium: 6.3 mg/dL — CL (ref 8.9–10.3)
Chloride: 97 mmol/L — ABNORMAL LOW (ref 98–111)
Creatinine: 1.04 mg/dL (ref 0.61–1.24)
GFR, Estimated: >60 mL/min (ref >60)
Glucose, Bld: 134 mg/dL — ABNORMAL HIGH (ref 70–99)
Potassium: 3.5 mmol/L (ref 3.5–5.1)
Sodium: 132 mmol/L — ABNORMAL LOW (ref 135–145)
Total Bilirubin: 0.7 mg/dL (ref 0.3–1.2)
Total Protein: 5.6 g/dL — ABNORMAL LOW (ref 6.5–8.1)

## 2020-09-29 LAB — CBC WITH DIFFERENTIAL (CANCER CENTER ONLY)
Abs Immature Granulocytes: 0.07 10*3/uL (ref 0.00–0.07)
Basophils Absolute: 0 10*3/uL (ref 0.0–0.1)
Basophils Relative: 0 %
Eosinophils Absolute: 0.4 10*3/uL (ref 0.0–0.5)
Eosinophils Relative: 7 %
HCT: 32.9 % — ABNORMAL LOW (ref 39.0–52.0)
Hemoglobin: 11 g/dL — ABNORMAL LOW (ref 13.0–17.0)
Immature Granulocytes: 1 %
Lymphocytes Relative: 2 %
Lymphs Abs: 0.1 10*3/uL — ABNORMAL LOW (ref 0.7–4.0)
MCH: 30.1 pg (ref 26.0–34.0)
MCHC: 33.4 g/dL (ref 30.0–36.0)
MCV: 90.1 fL (ref 80.0–100.0)
Monocytes Absolute: 0.7 10*3/uL (ref 0.1–1.0)
Monocytes Relative: 13 %
Neutro Abs: 4 10*3/uL (ref 1.7–7.7)
Neutrophils Relative %: 77 %
Platelet Count: 108 10*3/uL — ABNORMAL LOW (ref 150–400)
RBC: 3.65 MIL/uL — ABNORMAL LOW (ref 4.22–5.81)
RDW: 15.9 % — ABNORMAL HIGH (ref 11.5–15.5)
WBC Count: 5.3 10*3/uL (ref 4.0–10.5)
nRBC: 0 % (ref 0.0–0.2)

## 2020-09-29 MED ORDER — DIPHENHYDRAMINE HCL 25 MG PO CAPS
50.0000 mg | ORAL_CAPSULE | Freq: Once | ORAL | Status: AC
  Administered 2020-09-29: 50 mg via ORAL

## 2020-09-29 MED ORDER — FAMOTIDINE 20 MG PO TABS
40.0000 mg | ORAL_TABLET | Freq: Once | ORAL | Status: AC
  Administered 2020-09-29: 40 mg via ORAL

## 2020-09-29 MED ORDER — ACETAMINOPHEN 325 MG PO TABS: 650.0000 mg | ORAL_TABLET | Freq: Once | ORAL | Status: DC

## 2020-09-29 MED ORDER — SODIUM CHLORIDE 0.9 % IV SOLN
2.0000 g | Freq: Once | INTRAVENOUS | Status: AC
  Administered 2020-09-29: 2 g via INTRAVENOUS
  Filled 2020-09-29: qty 20

## 2020-09-29 MED ORDER — BORTEZOMIB CHEMO SQ INJECTION 3.5 MG (2.5MG/ML)
1.3000 mg/m2 | Freq: Once | INTRAMUSCULAR | Status: AC
  Administered 2020-09-29: 2.75 mg via SUBCUTANEOUS
  Filled 2020-09-29: qty 1.1

## 2020-09-29 MED ORDER — DARATUMUMAB-HYALURONIDASE-FIHJ 1800-30000 MG-UT/15ML ~~LOC~~ SOLN
1800.0000 mg | Freq: Once | SUBCUTANEOUS | Status: AC
  Administered 2020-09-29: 1800 mg via SUBCUTANEOUS
  Filled 2020-09-29: qty 15

## 2020-09-29 MED ORDER — DEXAMETHASONE 4 MG PO TABS
20.0000 mg | ORAL_TABLET | Freq: Once | ORAL | Status: AC
  Administered 2020-09-29: 20 mg via ORAL

## 2020-09-29 MED ORDER — SODIUM CHLORIDE 0.9 % IV SOLN
Freq: Once | INTRAVENOUS | Status: AC
  Filled 2020-09-29: qty 250

## 2020-09-29 NOTE — Telephone Encounter (Signed)
Dr. [name] notified of Calcium-6.3.  Order received for pt to receive 2 grams of IV Calcium today.

## 2020-09-29 NOTE — Progress Notes (Signed)
OK to give Darzalex Faspro and Velcade prior to IV Calcium today per order of Dr. [name],

## 2020-09-29 NOTE — Progress Notes (Signed)
Ok to treat with today's lab results per Dr. [name]. Verbal order for Calcium IV 2g. Pt. Is to stop Lisinopril and Maxzide indefinetly until Dr. [name] advise differently. Pt. Informed and verbalized understanding.

## 2020-09-29 NOTE — Patient Instructions (Signed)
Bortezomib injection What is this medicine? BORTEZOMIB (bor TEZ oh mib) is a medicine that targets proteins in cancer cells and stops the cancer cells from growing. It is used to treat multiple myeloma and mantle-cell lymphoma. This medicine may be used for other purposes; ask your health care provider or pharmacist if you have questions. COMMON BRAND NAME(S): Velcade What should I tell my health care provider before I take this medicine? They need to know if you have any of these conditions:  diabetes  heart disease  irregular heartbeat  liver disease  on hemodialysis  low blood counts, like low white blood cells, platelets, or hemoglobin  peripheral neuropathy  taking medicine for blood pressure  an unusual or allergic reaction to bortezomib, mannitol, boron, other medicines, foods, dyes, or preservatives  pregnant or trying to get pregnant  breast-feeding How should I use this medicine? This medicine is for injection into a vein or for injection under the skin. It is given by a health care professional in a hospital or clinic setting. Talk to your pediatrician regarding the use of this medicine in children. Special care may be needed. Overdosage: If you think you have taken too much of this medicine contact a poison control center or emergency room at once. NOTE: This medicine is only for you. Do not share this medicine with others. What if I miss a dose? It is important not to miss your dose. Call your doctor or health care professional if you are unable to keep an appointment. What may interact with this medicine? This medicine may interact with the following medications:  ketoconazole  rifampin  ritonavir  St. John's Wort This list may not describe all possible interactions. Give your health care provider a list of all the medicines, herbs, non-prescription drugs, or dietary supplements you use. Also tell them if you smoke, drink alcohol, or use illegal drugs. Some  items may interact with your medicine. What should I watch for while using this medicine? You may get drowsy or dizzy. Do not drive, use machinery, or do anything that needs mental alertness until you know how this medicine affects you. Do not stand or sit up quickly, especially if you are an older patient. This reduces the risk of dizzy or fainting spells. In some cases, you may be given additional medicines to help with side effects. Follow all directions for their use. Call your doctor or health care professional for advice if you get a fever, chills or sore throat, or other symptoms of a cold or flu. Do not treat yourself. This drug decreases your body's ability to fight infections. Try to avoid being around people who are sick. This medicine may increase your risk to bruise or bleed. Call your doctor or health care professional if you notice any unusual bleeding. You may need blood work done while you are taking this medicine. In some patients, this medicine may cause a serious brain infection that may cause death. If you have any problems seeing, thinking, speaking, walking, or standing, tell your doctor right away. If you cannot reach your doctor, urgently seek other source of medical care. Check with your doctor or health care professional if you get an attack of severe diarrhea, nausea and vomiting, or if you sweat a lot. The loss of too much body fluid can make it dangerous for you to take this medicine. Do not become pregnant while taking this medicine or for at least 7 months after stopping it. Women should inform their doctor   if they wish to become pregnant or think they might be pregnant. Men should not father a child while taking this medicine and for at least 4 months after stopping it. There is a potential for serious side effects to an unborn child. Talk to your health care professional or pharmacist for more information. Do not breast-feed an infant while taking this medicine or for 2  months after stopping it. This medicine may interfere with the ability to have a child. You should talk with your doctor or health care professional if you are concerned about your fertility. What side effects may I notice from receiving this medicine? Side effects that you should report to your doctor or health care professional as soon as possible:  allergic reactions like skin rash, itching or hives, swelling of the face, lips, or tongue  breathing problems  changes in hearing  changes in vision  fast, irregular heartbeat  feeling faint or lightheaded, falls  pain, tingling, numbness in the hands or feet  right upper belly pain  seizures  swelling of the ankles, feet, hands  unusual bleeding or bruising  unusually weak or tired  vomiting  yellowing of the eyes or skin Side effects that usually do not require medical attention (report to your doctor or health care professional if they continue or are bothersome):  changes in emotions or moods  constipation  diarrhea  loss of appetite  headache  irritation at site where injected  nausea This list may not describe all possible side effects. Call your doctor for medical advice about side effects. You may report side effects to FDA at 1-800-FDA-1088. Where should I keep my medicine? This drug is given in a hospital or clinic and will not be stored at home. NOTE: This sheet is a summary. It may not cover all possible information. If you have questions about this medicine, talk to your doctor, pharmacist, or health care provider.  2020 [employer] Standard (2018-02-26 16:29:31)  Daratumumab injection What is this medicine? DARATUMUMAB (dar a toom ue mab) is a monoclonal antibody. It is used to treat multiple myeloma. This medicine may be used for other purposes; ask your health care provider or pharmacist if you have questions. COMMON BRAND NAME(S): DARZALEX What should I tell my health care provider before I  take this medicine? They need to know if you have any of these conditions:  infection (especially a virus infection such as chickenpox, herpes, or hepatitis B virus)  lung or breathing disease  an unusual or allergic reaction to daratumumab, other medicines, foods, dyes, or preservatives  pregnant or trying to get pregnant  breast-feeding How should I use this medicine? This medicine is for infusion into a vein. It is given by a health care professional in a hospital or clinic setting. Talk to your pediatrician regarding the use of this medicine in children. Special care may be needed. Overdosage: If you think you have taken too much of this medicine contact a poison control center or emergency room at once. NOTE: This medicine is only for you. Do not share this medicine with others. What if I miss a dose? Keep appointments for follow-up doses as directed. It is important not to miss your dose. Call your doctor or health care professional if you are unable to keep an appointment. What may interact with this medicine? Interactions have not been studied. This list may not describe all possible interactions. Give your health care provider a list of all the medicines, herbs, non-prescription drugs,  or dietary supplements you use. Also tell them if you smoke, drink alcohol, or use illegal drugs. Some items may interact with your medicine. What should I watch for while using this medicine? This drug may make you feel generally unwell. Report any side effects. Continue your course of treatment even though you feel ill unless your doctor tells you to stop. This medicine can cause serious allergic reactions. To reduce your risk you may need to take medicine before treatment with this medicine. Take your medicine as directed. This medicine can affect the results of blood tests to match your blood type. These changes can last for up to 6 months after the final dose. Your healthcare provider will do  blood tests to match your blood type before you start treatment. Tell all of your healthcare providers that you are being treated with this medicine before receiving a blood transfusion. This medicine can affect the results of some tests used to determine treatment response; extra tests may be needed to evaluate response. Do not become pregnant while taking this medicine or for 3 months after stopping it. Women should inform their doctor if they wish to become pregnant or think they might be pregnant. There is a potential for serious side effects to an unborn child. Talk to your health care professional or pharmacist for more information. What side effects may I notice from receiving this medicine? Side effects that you should report to your doctor or health care professional as soon as possible:  allergic reactions like skin rash, itching or hives, swelling of the face, lips, or tongue  breathing problems  chills  cough  dizziness  feeling faint or lightheaded  headache  low blood counts - this medicine may decrease the number of white blood cells, red blood cells and platelets. You may be at increased risk for infections and bleeding.  nausea, vomiting  shortness of breath  signs of decreased platelets or bleeding - bruising, pinpoint red spots on the skin, black, tarry stools, blood in the urine  signs of decreased red blood cells - unusually weak or tired, feeling faint or lightheaded, falls  signs of infection - fever or chills, cough, sore throat, pain or difficulty passing urine  signs and symptoms of liver injury like dark yellow or brown urine; general ill feeling or flu-like symptoms; light-colored stools; loss of appetite; right upper belly pain; unusually weak or tired; yellowing of the eyes or skin Side effects that usually do not require medical attention (report to your doctor or health care professional if they continue or are bothersome):  back  pain  constipation  diarrhea  joint pain  muscle cramps  pain, tingling, numbness in the hands or feet  swelling of the ankles, feet, hands  tiredness  trouble sleeping This list may not describe all possible side effects. Call your doctor for medical advice about side effects. You may report side effects to FDA at 1-800-FDA-1088. Where should I keep my medicine? This drug is given in a hospital or clinic and will not be stored at home. NOTE: This sheet is a summary. It may not cover all possible information. If you have questions about this medicine, talk to your doctor, pharmacist, or health care provider.  2020 [employer]/Gold Standard (2019-06-25 18:10:54)  Calcium gluconate injection What is this medicine? CALCIUM GLUCONATE (KAL see um GLOO koe nate) is a calcium salt. It is used to prevent and to treat low calcium levels. This medicine may be used for other purposes; ask  your health care provider or pharmacist if you have questions. What should I tell my health care provider before I take this medicine? They need to know if you have any of these conditions:  high levels of calcium in the blood  history of irregular heartbeat  history of kidney stones  kidney disease  parathyroid disease  an unusual or allergic reaction to calcium, other medicines, foods, dyes, or preservatives  pregnant or trying to get pregnant  breast-feeding How should I use this medicine? This medicine is for infusion into a vein. It is given by a health care professional in a hospital or clinic setting. Talk to your pediatrician regarding the use of this medicine in children. While this drug may be prescribed for selected conditions, precautions do apply. Overdosage: If you think you have taken too much of this medicine contact a poison control center or emergency room at once. NOTE: This medicine is only for you. Do not share this medicine with others. What if I miss a dose? This does not  apply. What may interact with this medicine?  ceftriaxone  certain diuretics  digoxin  other calcium products This list may not describe all possible interactions. Give your health care provider a list of all the medicines, herbs, non-prescription drugs, or dietary supplements you use. Also tell them if you smoke, drink alcohol, or use illegal drugs. Some items may interact with your medicine. What should I watch for while using this medicine? Your condition will be monitored carefully while you are receiving this medicine. You may need blood work done while you are taking this medicine. What side effects may I notice from receiving this medicine? Side effects that you should report to your doctor or health care professional as soon as possible:  allergic reactions like skin rash, itching or hives, swelling of the face, lips, or tongue  fast, irregular heartbeat  low blood pressure  signs and symptoms of high calcium like thirst, severe or continued nausea or vomiting, constipation, stomach pain, muscle weakness, fatigue, confusion, or difficulty in concentration Side effects that usually do not require medical attention (report these to your doctor or health care professional if they continue or are bothersome):  changes in taste  tingling in hands or feet This list may not describe all possible side effects. Call your doctor for medical advice about side effects. You may report side effects to FDA at 1-800-FDA-1088. Where should I keep my medicine? This drug is given in a hospital or clinic and will not be stored at home. NOTE: This sheet is a summary. It may not cover all possible information. If you have questions about this medicine, talk to your doctor, pharmacist, or health care provider.  2020 [employer]/Gold Standard (2017-05-30 11:47:26)

## 2020-09-30 ENCOUNTER — Other Ambulatory Visit: Payer: Self-pay | Admitting: *Deleted

## 2020-09-30 DIAGNOSIS — C9000 Multiple myeloma not having achieved remission: Secondary | ICD-10-CM

## 2020-09-30 MED ORDER — LENALIDOMIDE 25 MG PO CAPS: 25.0000 mg | ORAL_CAPSULE | Freq: Every day | ORAL | 0 refills | Status: DC

## 2020-10-06 ENCOUNTER — Inpatient Hospital Stay: Payer: BC Managed Care – PPO | Attending: Hematology & Oncology

## 2020-10-06 ENCOUNTER — Encounter: Payer: Self-pay | Admitting: *Deleted

## 2020-10-06 ENCOUNTER — Encounter: Payer: Self-pay | Admitting: Hematology & Oncology

## 2020-10-06 ENCOUNTER — Inpatient Hospital Stay (HOSPITAL_BASED_OUTPATIENT_CLINIC_OR_DEPARTMENT_OTHER): Payer: BC Managed Care – PPO | Admitting: Hematology & Oncology

## 2020-10-06 ENCOUNTER — Other Ambulatory Visit: Payer: Self-pay

## 2020-10-06 ENCOUNTER — Other Ambulatory Visit: Payer: Self-pay | Admitting: Oncology

## 2020-10-06 ENCOUNTER — Inpatient Hospital Stay: Payer: BC Managed Care – PPO

## 2020-10-06 VITALS — BP 119/60 | HR 65 | Temp 97.6°F | Resp 18 | Wt 188.2 lb

## 2020-10-06 VITALS — BP 134/59 | HR 59 | Temp 97.8°F | Resp 18

## 2020-10-06 DIAGNOSIS — Z7952 Long term (current) use of systemic steroids: Secondary | ICD-10-CM | POA: Insufficient documentation

## 2020-10-06 DIAGNOSIS — Z5111 Encounter for antineoplastic chemotherapy: Secondary | ICD-10-CM | POA: Insufficient documentation

## 2020-10-06 DIAGNOSIS — Z79899 Other long term (current) drug therapy: Secondary | ICD-10-CM | POA: Insufficient documentation

## 2020-10-06 DIAGNOSIS — Z87311 Personal history of (healed) other pathological fracture: Secondary | ICD-10-CM | POA: Diagnosis not present

## 2020-10-06 DIAGNOSIS — Z7982 Long term (current) use of aspirin: Secondary | ICD-10-CM | POA: Insufficient documentation

## 2020-10-06 DIAGNOSIS — C9000 Multiple myeloma not having achieved remission: Secondary | ICD-10-CM

## 2020-10-06 DIAGNOSIS — Z7189 Other specified counseling: Secondary | ICD-10-CM

## 2020-10-06 DIAGNOSIS — R110 Nausea: Secondary | ICD-10-CM | POA: Insufficient documentation

## 2020-10-06 DIAGNOSIS — M79651 Pain in right thigh: Secondary | ICD-10-CM | POA: Diagnosis not present

## 2020-10-06 DIAGNOSIS — C419 Malignant neoplasm of bone and articular cartilage, unspecified: Secondary | ICD-10-CM

## 2020-10-06 LAB — CMP (CANCER CENTER ONLY)
ALT: 15 U/L (ref 0–44)
AST: 9 U/L — ABNORMAL LOW (ref 15–41)
Albumin: 3.5 g/dL (ref 3.5–5.0)
Alkaline Phosphatase: 129 U/L — ABNORMAL HIGH (ref 38–126)
Anion gap: 10 (ref 5–15)
BUN: 21 mg/dL (ref 8–23)
CO2: 24 mmol/L (ref 22–32)
Calcium: 6.5 mg/dL — ABNORMAL LOW (ref 8.9–10.3)
Chloride: 102 mmol/L (ref 98–111)
Creatinine: 0.98 mg/dL (ref 0.61–1.24)
GFR, Estimated: >60 mL/min (ref >60)
Glucose, Bld: 107 mg/dL — ABNORMAL HIGH (ref 70–99)
Potassium: 3.8 mmol/L (ref 3.5–5.1)
Sodium: 136 mmol/L (ref 135–145)
Total Bilirubin: 0.4 mg/dL (ref 0.3–1.2)
Total Protein: 5.8 g/dL — ABNORMAL LOW (ref 6.5–8.1)

## 2020-10-06 LAB — CBC WITH DIFFERENTIAL (CANCER CENTER ONLY)
Abs Immature Granulocytes: 0.03 10*3/uL (ref 0.00–0.07)
Basophils Absolute: 0 10*3/uL (ref 0.0–0.1)
Basophils Relative: 0 %
Eosinophils Absolute: 0.2 10*3/uL (ref 0.0–0.5)
Eosinophils Relative: 5 %
HCT: 33.2 % — ABNORMAL LOW (ref 39.0–52.0)
Hemoglobin: 10.9 g/dL — ABNORMAL LOW (ref 13.0–17.0)
Immature Granulocytes: 1 %
Lymphocytes Relative: 3 %
Lymphs Abs: 0.1 10*3/uL — ABNORMAL LOW (ref 0.7–4.0)
MCH: 29.9 pg (ref 26.0–34.0)
MCHC: 32.8 g/dL (ref 30.0–36.0)
MCV: 91 fL (ref 80.0–100.0)
Monocytes Absolute: 0.8 10*3/uL (ref 0.1–1.0)
Monocytes Relative: 16 %
Neutro Abs: 3.6 10*3/uL (ref 1.7–7.7)
Neutrophils Relative %: 75 %
Platelet Count: 159 10*3/uL (ref 150–400)
RBC: 3.65 MIL/uL — ABNORMAL LOW (ref 4.22–5.81)
RDW: 16 % — ABNORMAL HIGH (ref 11.5–15.5)
WBC Count: 4.8 10*3/uL (ref 4.0–10.5)
nRBC: 0 % (ref 0.0–0.2)

## 2020-10-06 LAB — LACTATE DEHYDROGENASE: LDH: 217 U/L — ABNORMAL HIGH (ref 98–192)

## 2020-10-06 MED ORDER — DIPHENHYDRAMINE HCL 25 MG PO CAPS
50.0000 mg | ORAL_CAPSULE | Freq: Once | ORAL | Status: AC
  Administered 2020-10-06: 50 mg via ORAL

## 2020-10-06 MED ORDER — SILVER SULFADIAZINE 1 % EX CREA: 1.0000 "application " | TOPICAL_CREAM | Freq: Two times a day (BID) | CUTANEOUS | 0 refills | Status: DC

## 2020-10-06 MED ORDER — DARATUMUMAB-HYALURONIDASE-FIHJ 1800-30000 MG-UT/15ML ~~LOC~~ SOLN
1800.0000 mg | Freq: Once | SUBCUTANEOUS | Status: AC
  Administered 2020-10-06: 1800 mg via SUBCUTANEOUS
  Filled 2020-10-06: qty 15

## 2020-10-06 MED ORDER — ACETAMINOPHEN 325 MG PO TABS
ORAL_TABLET | ORAL | Status: AC
  Filled 2020-10-06: qty 2

## 2020-10-06 MED ORDER — DIPHENHYDRAMINE HCL 25 MG PO CAPS
ORAL_CAPSULE | ORAL | Status: AC
  Filled 2020-10-06: qty 2

## 2020-10-06 MED ORDER — ACETAMINOPHEN 325 MG PO TABS
650.0000 mg | ORAL_TABLET | Freq: Once | ORAL | Status: AC
  Administered 2020-10-06: 650 mg via ORAL

## 2020-10-06 MED ORDER — SODIUM CHLORIDE 0.9 % IV SOLN
2.0000 g | Freq: Every day | INTRAVENOUS | Status: DC | PRN
  Administered 2020-10-06: 2 g via INTRAVENOUS
  Filled 2020-10-06: qty 20

## 2020-10-06 MED ORDER — DENOSUMAB 120 MG/1.7ML ~~LOC~~ SOLN: 120.0000 mg | Freq: Once | SUBCUTANEOUS | Status: DC

## 2020-10-06 MED ORDER — FAMOTIDINE 20 MG PO TABS
40.0000 mg | ORAL_TABLET | Freq: Once | ORAL | Status: AC
  Administered 2020-10-06: 40 mg via ORAL

## 2020-10-06 MED ORDER — DEXAMETHASONE 4 MG PO TABS
ORAL_TABLET | ORAL | Status: AC
  Filled 2020-10-06: qty 5

## 2020-10-06 MED ORDER — BORTEZOMIB CHEMO SQ INJECTION 3.5 MG (2.5MG/ML)
1.3000 mg/m2 | Freq: Once | INTRAMUSCULAR | Status: AC
  Administered 2020-10-06: 2.75 mg via SUBCUTANEOUS
  Filled 2020-10-06: qty 1.1

## 2020-10-06 MED ORDER — DEXAMETHASONE 4 MG PO TABS
20.0000 mg | ORAL_TABLET | Freq: Once | ORAL | Status: AC
  Administered 2020-10-06: 20 mg via ORAL

## 2020-10-06 MED ORDER — SODIUM CHLORIDE 0.9 % IV SOLN
Freq: Once | INTRAVENOUS | Status: AC
  Filled 2020-10-06: qty 250

## 2020-10-06 NOTE — Patient Instructions (Signed)
Bortezomib injection What is this medicine? BORTEZOMIB (bor TEZ oh mib) is a medicine that targets proteins in cancer cells and stops the cancer cells from growing. It is used to treat multiple myeloma and mantle-cell lymphoma. This medicine may be used for other purposes; ask your health care provider or pharmacist if you have questions. COMMON BRAND NAME(S): Velcade What should I tell my health care provider before I take this medicine? They need to know if you have any of these conditions:  diabetes  heart disease  irregular heartbeat  liver disease  on hemodialysis  low blood counts, like low white blood cells, platelets, or hemoglobin  peripheral neuropathy  taking medicine for blood pressure  an unusual or allergic reaction to bortezomib, mannitol, boron, other medicines, foods, dyes, or preservatives  pregnant or trying to get pregnant  breast-feeding How should I use this medicine? This medicine is for injection into a vein or for injection under the skin. It is given by a health care professional in a hospital or clinic setting. Talk to your pediatrician regarding the use of this medicine in children. Special care may be needed. Overdosage: If you think you have taken too much of this medicine contact a poison control center or emergency room at once. NOTE: This medicine is only for you. Do not share this medicine with others. What if I miss a dose? It is important not to miss your dose. Call your doctor or health care professional if you are unable to keep an appointment. What may interact with this medicine? This medicine may interact with the following medications:  ketoconazole  rifampin  ritonavir  St. John's Wort This list may not describe all possible interactions. Give your health care provider a list of all the medicines, herbs, non-prescription drugs, or dietary supplements you use. Also tell them if you smoke, drink alcohol, or use illegal drugs. Some  items may interact with your medicine. What should I watch for while using this medicine? You may get drowsy or dizzy. Do not drive, use machinery, or do anything that needs mental alertness until you know how this medicine affects you. Do not stand or sit up quickly, especially if you are an older patient. This reduces the risk of dizzy or fainting spells. In some cases, you may be given additional medicines to help with side effects. Follow all directions for their use. Call your doctor or health care professional for advice if you get a fever, chills or sore throat, or other symptoms of a cold or flu. Do not treat yourself. This drug decreases your body's ability to fight infections. Try to avoid being around people who are sick. This medicine may increase your risk to bruise or bleed. Call your doctor or health care professional if you notice any unusual bleeding. You may need blood work done while you are taking this medicine. In some patients, this medicine may cause a serious brain infection that may cause death. If you have any problems seeing, thinking, speaking, walking, or standing, tell your doctor right away. If you cannot reach your doctor, urgently seek other source of medical care. Check with your doctor or health care professional if you get an attack of severe diarrhea, nausea and vomiting, or if you sweat a lot. The loss of too much body fluid can make it dangerous for you to take this medicine. Do not become pregnant while taking this medicine or for at least 7 months after stopping it. Women should inform their doctor   if they wish to become pregnant or think they might be pregnant. Men should not father a child while taking this medicine and for at least 4 months after stopping it. There is a potential for serious side effects to an unborn child. Talk to your health care professional or pharmacist for more information. Do not breast-feed an infant while taking this medicine or for 2  months after stopping it. This medicine may interfere with the ability to have a child. You should talk with your doctor or health care professional if you are concerned about your fertility. What side effects may I notice from receiving this medicine? Side effects that you should report to your doctor or health care professional as soon as possible:  allergic reactions like skin rash, itching or hives, swelling of the face, lips, or tongue  breathing problems  changes in hearing  changes in vision  fast, irregular heartbeat  feeling faint or lightheaded, falls  pain, tingling, numbness in the hands or feet  right upper belly pain  seizures  swelling of the ankles, feet, hands  unusual bleeding or bruising  unusually weak or tired  vomiting  yellowing of the eyes or skin Side effects that usually do not require medical attention (report to your doctor or health care professional if they continue or are bothersome):  changes in emotions or moods  constipation  diarrhea  loss of appetite  headache  irritation at site where injected  nausea This list may not describe all possible side effects. Call your doctor for medical advice about side effects. You may report side effects to FDA at 1-800-FDA-1088. Where should I keep my medicine? This drug is given in a hospital or clinic and will not be stored at home. NOTE: This sheet is a summary. It may not cover all possible information. If you have questions about this medicine, talk to your doctor, pharmacist, or health care provider.  2020 Elsevier/Gold Standard (2018-02-26 16:29:31)  Daratumumab injection What is this medicine? DARATUMUMAB (dar a toom ue mab) is a monoclonal antibody. It is used to treat multiple myeloma. This medicine may be used for other purposes; ask your health care provider or pharmacist if you have questions. COMMON BRAND NAME(S): DARZALEX What should I tell my health care provider before I  take this medicine? They need to know if you have any of these conditions:  infection (especially a virus infection such as chickenpox, herpes, or hepatitis B virus)  lung or breathing disease  an unusual or allergic reaction to daratumumab, other medicines, foods, dyes, or preservatives  pregnant or trying to get pregnant  breast-feeding How should I use this medicine? This medicine is for infusion into a vein. It is given by a health care professional in a hospital or clinic setting. Talk to your pediatrician regarding the use of this medicine in children. Special care may be needed. Overdosage: If you think you have taken too much of this medicine contact a poison control center or emergency room at once. NOTE: This medicine is only for you. Do not share this medicine with others. What if I miss a dose? Keep appointments for follow-up doses as directed. It is important not to miss your dose. Call your doctor or health care professional if you are unable to keep an appointment. What may interact with this medicine? Interactions have not been studied. This list may not describe all possible interactions. Give your health care provider a list of all the medicines, herbs, non-prescription drugs,  or dietary supplements you use. Also tell them if you smoke, drink alcohol, or use illegal drugs. Some items may interact with your medicine. What should I watch for while using this medicine? This drug may make you feel generally unwell. Report any side effects. Continue your course of treatment even though you feel ill unless your doctor tells you to stop. This medicine can cause serious allergic reactions. To reduce your risk you may need to take medicine before treatment with this medicine. Take your medicine as directed. This medicine can affect the results of blood tests to match your blood type. These changes can last for up to 6 months after the final dose. Your healthcare provider will do  blood tests to match your blood type before you start treatment. Tell all of your healthcare providers that you are being treated with this medicine before receiving a blood transfusion. This medicine can affect the results of some tests used to determine treatment response; extra tests may be needed to evaluate response. Do not become pregnant while taking this medicine or for 3 months after stopping it. Women should inform their doctor if they wish to become pregnant or think they might be pregnant. There is a potential for serious side effects to an unborn child. Talk to your health care professional or pharmacist for more information. What side effects may I notice from receiving this medicine? Side effects that you should report to your doctor or health care professional as soon as possible:  allergic reactions like skin rash, itching or hives, swelling of the face, lips, or tongue  breathing problems  chills  cough  dizziness  feeling faint or lightheaded  headache  low blood counts - this medicine may decrease the number of white blood cells, red blood cells and platelets. You may be at increased risk for infections and bleeding.  nausea, vomiting  shortness of breath  signs of decreased platelets or bleeding - bruising, pinpoint red spots on the skin, black, tarry stools, blood in the urine  signs of decreased red blood cells - unusually weak or tired, feeling faint or lightheaded, falls  signs of infection - fever or chills, cough, sore throat, pain or difficulty passing urine  signs and symptoms of liver injury like dark yellow or brown urine; general ill feeling or flu-like symptoms; light-colored stools; loss of appetite; right upper belly pain; unusually weak or tired; yellowing of the eyes or skin Side effects that usually do not require medical attention (report to your doctor or health care professional if they continue or are bothersome):  back  pain  constipation  diarrhea  joint pain  muscle cramps  pain, tingling, numbness in the hands or feet  swelling of the ankles, feet, hands  tiredness  trouble sleeping This list may not describe all possible side effects. Call your doctor for medical advice about side effects. You may report side effects to FDA at 1-800-FDA-1088. Where should I keep my medicine? This drug is given in a hospital or clinic and will not be stored at home. NOTE: This sheet is a summary. It may not cover all possible information. If you have questions about this medicine, talk to your doctor, pharmacist, or health care provider.  2020 Elsevier/Gold Standard (2019-06-25 18:10:54)  Calcium gluconate injection What is this medicine? CALCIUM GLUCONATE (KAL see um GLOO koe nate) is a calcium salt. It is used to prevent and to treat low calcium levels. This medicine may be used for other purposes; ask  your health care provider or pharmacist if you have questions. What should I tell my health care provider before I take this medicine? They need to know if you have any of these conditions:  high levels of calcium in the blood  history of irregular heartbeat  history of kidney stones  kidney disease  parathyroid disease  an unusual or allergic reaction to calcium, other medicines, foods, dyes, or preservatives  pregnant or trying to get pregnant  breast-feeding How should I use this medicine? This medicine is for infusion into a vein. It is given by a health care professional in a hospital or clinic setting. Talk to your pediatrician regarding the use of this medicine in children. While this drug may be prescribed for selected conditions, precautions do apply. Overdosage: If you think you have taken too much of this medicine contact a poison control center or emergency room at once. NOTE: This medicine is only for you. Do not share this medicine with others. What if I miss a dose? This does not  apply. What may interact with this medicine?  ceftriaxone  certain diuretics  digoxin  other calcium products This list may not describe all possible interactions. Give your health care provider a list of all the medicines, herbs, non-prescription drugs, or dietary supplements you use. Also tell them if you smoke, drink alcohol, or use illegal drugs. Some items may interact with your medicine. What should I watch for while using this medicine? Your condition will be monitored carefully while you are receiving this medicine. You may need blood work done while you are taking this medicine. What side effects may I notice from receiving this medicine? Side effects that you should report to your doctor or health care professional as soon as possible:  allergic reactions like skin rash, itching or hives, swelling of the face, lips, or tongue  fast, irregular heartbeat  low blood pressure  signs and symptoms of high calcium like thirst, severe or continued nausea or vomiting, constipation, stomach pain, muscle weakness, fatigue, confusion, or difficulty in concentration Side effects that usually do not require medical attention (report these to your doctor or health care professional if they continue or are bothersome):  changes in taste  tingling in hands or feet This list may not describe all possible side effects. Call your doctor for medical advice about side effects. You may report side effects to FDA at 1-800-FDA-1088. Where should I keep my medicine? This drug is given in a hospital or clinic and will not be stored at home. NOTE: This sheet is a summary. It may not cover all possible information. If you have questions about this medicine, talk to your doctor, pharmacist, or health care provider.  2020 Elsevier/Gold Standard (2017-05-30 11:47:26)  Hypocalcemia, Adult Hypocalcemia is when the level of calcium in a person's blood is below normal. Calcium is a mineral that is used by  the body in many ways. Not having enough blood calcium can affect the nervous system. This can lead to problems with muscles, the heart, and the brain. What are the causes? This condition may be caused by:  A deficiency of vitamin D or magnesium or both.  Decreased levels of parathyroid hormone (hypoparathyroidism).  Kidney function problems.  Low levels of a body protein called albumin.  Inflammation of the pancreas (pancreatitis).  Not taking in enough vitamins and minerals in the diet or having intestinal problems that interfere with nutrient absorption.  Certain medicines. What are the signs or symptoms? Some  people may not have any symptoms, especially if they have long-term (chronic) hypocalcemia. Symptoms of this condition may include:  Numbness and tingling in the fingers, toes, or around the mouth.  Muscle twitching, aches, or cramps, especially in the legs, feet, and back.  Spasm of the voice box (laryngospasm). This may make it difficult to breath or speak.  Fast heartbeats (palpitations) and abnormal heart rhythms (arrhythmias).  Shaking uncontrollably (seizures).  Memory problems, confusion, or difficulty thinking.  Depression, anxiety, irritability, or changes in personality. Long-term symptoms of this condition may include:  Coarse, brittle hair and nails.  Dry skin or lasting skin diseases (psoriasis, eczema, or dermatitis).  Dental cavities.  Clouding of the eye lens (cataracts). How is this diagnosed?  This condition is usually diagnosed with a blood test. You may also have other tests to help determine the underlying cause of the condition. This may include more blood tests and imaging tests. How is this treated? This condition may be treated with:  Calcium given by mouth (orally) or given through an IV. The method used for giving calcium will depend on the severity of the condition. If your condition is severe, you may need to be closely monitored  in the hospital.  Giving other minerals (electrolytes), such as magnesium. Other treatment will depend on the cause of the condition. Follow these instructions at home:  Follow diet instructions from your health care provider or dietitian.  Take supplements only as told by your health care provider.  Keep all follow-up visits as told by your health care provider. This is important. Contact a health care provider if you:  Have increased muscle twitching or cramps.  Have new swelling in the feet, ankles, or legs.  Develop changes in mood, memory, or personality. Get help right away if you:  Have chest pain.  Have persistent rapid or irregular heartbeats.  Have difficulty breathing.  Faint.  Start to have seizures.  Have confusion. Summary  Hypocalcemia is when the level of calcium in a person's blood is below normal. Not having enough blood calcium can affect the nervous system. This can lead to problems with muscles, the heart, and the brain.  This condition may be treated with calcium given by mouth or through an IV, taking other minerals, and treating the underlying cause of hypocalcemia.  Take supplements only as told by your health care provider.  Contact a health care provider if you have new or worsening symptoms.  Keep all follow-up visits as told by your health care provider. This is important. This information is not intended to replace advice given to you by your health care provider. Make sure you discuss any questions you have with your health care provider. Document Revised: 10/26/2018 Document Reviewed: 10/26/2018 Elsevier Patient Education  2020 [employer].

## 2020-10-06 NOTE — Progress Notes (Signed)
Patient needs PET and BM Bx. I was able to schedule PET for 12/17. Called and spoke to patient and his wife. Reviewed PET scan appointment including time, date and location. Also reviewed NPO status and low carb dinner the night before.   Oncology Nurse Navigator Documentation  Oncology Nurse Navigator Flowsheets 10/06/2020  Abnormal Finding Date -  Confirmed Diagnosis Date -  Diagnosis Status -  Planned Course of Treatment -  Phase of Treatment -  Chemotherapy Actual Start Date: -  Radiation Actual Start Date: -  Radiation Expected End Date: -  Radiation Actual End Date: -  Navigator Follow Up Date: 10/16/2020  Navigator Follow Up Reason: Scan Review  Navigator Location [facility]  Navigator Encounter Type Appt/Treatment Plan Review;Treatment  Telephone -  Patient Visit Type MedOnc  Treatment Phase Active Tx  Barriers/Navigation Needs Coordination of Care;Education;Family Concerns  Education Other  Interventions Coordination of Care;Education;Psycho-Social Support  Acuity Level 2-Minimal Needs (1-2 Barriers Identified)  Referrals -  Coordination of Care Appts;Radiology  Education Method Verbal  Support Groups/Services Friends and Family  Time Spent with Patient 73

## 2020-10-06 NOTE — Progress Notes (Signed)
Hematology and Oncology Follow Up Visit  Bruce Smith [id number] 06/09/1951 69 y.o. 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 Bruce Smith had a little bit of a tough time with treatment. Bruce Smith just does not feel all that well. I just wonder if this might be from the Revlimid. I told him to hold off on the Revlimid for right now.  Bruce Smith really has responded nicely to treatment. There is no longer a monoclonal spike in his blood. His light chains also have come down to normal.  I think that we are close to get him over to transplant.  I does wish Bruce Smith would feel a little bit better. I did switch that Bruce Smith would have little more energy.  Bruce Smith seems to be doing little bit better with pain. Bruce Smith is not on morphine now. Bruce Smith is taking Ultram.  Bruce Smith has had no problems with diarrhea. Bruce Smith has had no cough or shortness of breath. Nausea has been a real problem for him. We have tried different nausea medications. Hopefully, the Zyprexa may help a little bit.  I really told him to take the Marinol. Hopefully, Bruce Smith will take the Marinol twice a day and this may help with some of the nausea.  Currently, his performance status is ECOG 1.   Medications:  Current Outpatient Medications:  .  aspirin 81 MG EC tablet, Take 81 mg by mouth daily. Swallow whole., Disp: , Rfl:  .  atorvastatin (LIPITOR) 20 MG tablet, Take 20 mg by mouth daily., Disp: , Rfl:  .  atorvastatin (LIPITOR) 40 MG tablet, TAKE 1/2 TABLET BY MOUTH DAILY FOR CHOLESTEROL, Disp: , Rfl:  .  baclofen (LIORESAL) 10 MG  tablet, Take 1 tablet (10 mg total) by mouth 3 (three) times daily. As needed for muscle spasm, Disp: 50 tablet, Rfl: 0 .  Coenzyme Q10 (CO Q-10) 200 MG CAPS, Take 200 mg by mouth daily., Disp: , Rfl:  .  cyclobenzaprine (FLEXERIL) 10 MG tablet, Take 1 tablet (10 mg total) by mouth 3 (three) times daily as needed for muscle spasms., Disp: 30 tablet, Rfl: 0 .  dexamethasone (DECADRON) 4 MG tablet, Take 1 tablet (4 mg total) by mouth daily. Take for 3 days after chemotherapy treatment. Take with meals., Disp: 30 tablet, Rfl: 2 .  dronabinol (MARINOL) 5 MG capsule, Take 1 capsule (5 mg total) by mouth 2 (two) times daily before a meal., Disp: 60 capsule, Rfl: 0 .  famciclovir (FAMVIR) 500 MG tablet, Take 1 tablet (500 mg total) by mouth daily., Disp: 30 tablet, Rfl: 8 .  hydrOXYzine (ATARAX/VISTARIL) 10 MG tablet, Take 1 tablet (10 mg total) by mouth 3 (three) times daily as needed., Disp: 30 tablet, Rfl: 0 .  lenalidomide (REVLIMID) 25 MG capsule, Take 1 capsule (25 mg total) by mouth daily. 14 days on, then 14 days off. [employer] Auth # [id number], Disp: 14 capsule, Rfl: 0 .  lisinopril (ZESTRIL) 5 MG tablet, Take 7.5 mg by mouth daily., Disp: , Rfl:  .  LORazepam (ATIVAN) 0.5 MG tablet,  Take 1 tablet (0.5 mg total) by mouth every 6 (six) hours as needed for anxiety (nausea)., Disp: 40 tablet, Rfl: 0 .  metoprolol succinate (TOPROL-XL) 50 MG 24 hr tablet, Take 50 mg by mouth daily., Disp: , Rfl:  .  morphine (MS CONTIN) 15 MG 12 hr tablet, Take 1 tablet (15 mg total) by mouth every 12 (twelve) hours., Disp: 60 tablet, Rfl: 0 .  morphine (MSIR) 15 MG tablet, Take 1 tablet (15 mg total) by mouth every 6 (six) hours as needed for severe pain., Disp: 90 tablet, Rfl: 0 .  nitroGLYCERIN (NITROSTAT) 0.4 MG SL tablet, Place 0.4 mg under the tongue every 5 (five) minutes as needed for chest pain., Disp: , Rfl:  .  OLANZapine (ZYPREXA) 10 MG tablet, Take 1 tablet (10 mg total) by mouth at bedtime., Disp: 30 tablet,  Rfl: 2 .  omeprazole (PRILOSEC) 40 MG capsule, Take by mouth., Disp: , Rfl:  .  ondansetron (ZOFRAN) 8 MG tablet, Take 1 tablet (8 mg total) by mouth every 8 (eight) hours as needed for nausea or vomiting., Disp: 20 tablet, Rfl: 1 .  Polyethylene Glycol 3350 (MIRALAX PO), Take 2 Scoops by mouth daily., Disp: , Rfl:  .  Polyethylene Glycol 3350 (MIRALAX PO), Take by mouth as needed., Disp: , Rfl:  .  prasugrel (EFFIENT) 10 MG TABS tablet, Take 10 mg by mouth daily., Disp: , Rfl:  .  pregabalin (LYRICA) 150 MG capsule, Take 150 mg by mouth 2 (two) times daily as needed (Nerve pain). , Disp: , Rfl:  .  prochlorperazine (COMPAZINE) 10 MG tablet, Take 1 tablet (10 mg total) by mouth every 6 (six) hours as needed for nausea or vomiting., Disp: 10 tablet, Rfl: 0 .  rivaroxaban (XARELTO) 10 MG TABS tablet, Take 1 tablet (10 mg total) by mouth daily., Disp: 30 tablet, Rfl: 0 .  sennosides-docusate sodium (SENOKOT-S) 8.6-50 MG tablet, Take 2 tablets by mouth daily., Disp: 30 tablet, Rfl: 1 .  traMADol (ULTRAM) 50 MG tablet, Take 1 tablet (50 mg total) by mouth 3 (three) times daily as needed for moderate pain., Disp: 90 tablet, Rfl: 0 .  triamterene-hydrochlorothiazide (MAXZIDE) 75-50 MG tablet, Take 1 tablet by mouth daily., Disp: 30 tablet, Rfl: 4 .  acetaminophen (TYLENOL) 500 MG tablet, Take 1,000 mg by mouth every 6 (six) hours. (Patient not taking: Reported on 10/06/2020), Disp: , Rfl:   Allergies: No Known Allergies  Past Medical History, Surgical history, Social history, and Family History were reviewed and updated.  Review of Systems: Review of Systems  Constitutional: Negative.   HENT:  Negative.   Eyes: Negative.   Respiratory: Negative.   Cardiovascular: Negative.   Gastrointestinal: Negative.   Endocrine: Negative.   Genitourinary: Negative.    Musculoskeletal: Positive for arthralgias.  Skin: Negative.   Neurological: Negative.   Hematological: Negative.   Psychiatric/Behavioral:  Negative.     Physical Exam:  weight is 188 lb 4 oz (85.4 kg). His oral temperature is 97.6 F (36.4 C). His blood pressure is 119/60 and his pulse is 65. His respiration is 18 and oxygen saturation is 100%.   Wt Readings from Last 3 Encounters:  10/06/20 188 lb 4 oz (85.4 kg)  09/10/20 201 lb (91.2 kg)  09/08/20 196 lb 4 oz (89 kg)    Physical Exam Vitals reviewed.  HENT:     Head: Normocephalic and atraumatic.  Eyes:     Pupils: Pupils are equal, round, and reactive to light.  Cardiovascular:  Rate and Rhythm: Normal rate and regular rhythm.     Heart sounds: Normal heart sounds.  Pulmonary:     Effort: Pulmonary effort is normal.     Breath sounds: Normal breath sounds.  Abdominal:     General: Bowel sounds are normal.     Palpations: Abdomen is soft.  Musculoskeletal:        General: No tenderness or deformity. Normal range of motion.     Cervical back: Normal range of motion.     Comments: Bruce Smith has the left hip surgical scar that is healed.  Bruce Smith has some decreased range of motion of the left hip area.  Bruce Smith has some discomfort in the left shoulder blade.  Bruce Smith does seem to have little bit of better range of motion in this area.  Lymphadenopathy:     Cervical: No cervical adenopathy.  Skin:    General: Skin is warm and dry.     Findings: No erythema or rash.  Neurological:     Mental Status: Bruce Smith is alert and oriented to person, place, and time.  Psychiatric:        Behavior: Behavior normal.        Thought Content: Thought content normal.        Judgment: Judgment normal.    Lab Results  Component Value Date   WBC 4.8 10/06/2020   HGB 10.9 (L) 10/06/2020   HCT 33.2 (L) 10/06/2020   MCV 91.0 10/06/2020   PLT 159 10/06/2020     Chemistry      Component Value Date/Time   NA 136 10/06/2020 0944   K 3.8 10/06/2020 0944   CL 102 10/06/2020 0944   CO2 24 10/06/2020 0944   BUN 21 10/06/2020 0944   CREATININE 0.98 10/06/2020 0944      Component Value Date/Time    CALCIUM 6.5 (L) 10/06/2020 0944   ALKPHOS 129 (H) 10/06/2020 0944   AST 9 (L) 10/06/2020 0944   ALT 15 10/06/2020 0944   BILITOT 0.4 10/06/2020 0944      Impression and Plan: Bruce Smith is a very nice 69 year old white male.  Bruce Smith has kappa light chain myeloma.  I was supposed not to surprised by this as light chain myeloma does tend to involve the bones.  Bruce Smith has had radiation therapy.    We will proceed with cycle #3 of chemotherapy.  Again, we will stop the Revlimid for right now.  We will do a 24-hour urine on him. We will see of the urinary light chains are.  I think it is now time for repeat bone marrow biopsy. If we see that Bruce Smith has responded nicely, then we will get him over to [facility] for a transplant evaluation.  I also would like to get a PET scan on him.  His calcium is still on the low side. I am surprised by this. More we first saw him, his calcium was quite high. We are holding on Xgeva because of his hypocalcemia.   Volanda Napoleon, MD 12/7/202111:07 AM

## 2020-10-06 NOTE — Progress Notes (Signed)
Patient inquired about taking calcium at home, Per Dr.Ennever patent can take 2 tums daily too see if it helps improve his calcium. Patient verbalized understanding.    Pt discharged in no apparent distress. Pt left ambulatory with walker . Pt aware of discharge instructions and verbalized understanding and had no further questions.

## 2020-10-07 LAB — KAPPA/LAMBDA LIGHT CHAINS
Kappa free light chain: 9.8 mg/L (ref 3.3–19.4)
Kappa, lambda light chain ratio: 2.09 — ABNORMAL HIGH (ref 0.26–1.65)
Lambda free light chains: 4.7 mg/L — ABNORMAL LOW (ref 5.7–26.3)

## 2020-10-07 LAB — BETA 2 MICROGLOBULIN, SERUM: Beta-2 Microglobulin: 2.8 mg/L — ABNORMAL HIGH (ref 0.6–2.4)

## 2020-10-07 LAB — IGG, IGA, IGM
IgA: 71 mg/dL (ref 61–437)
IgG (Immunoglobin G), Serum: 353 mg/dL — ABNORMAL LOW (ref 603–1613)
IgM (Immunoglobulin M), Srm: 12 mg/dL — ABNORMAL LOW (ref 20–172)

## 2020-10-08 ENCOUNTER — Encounter: Payer: Self-pay | Admitting: *Deleted

## 2020-10-08 NOTE — Progress Notes (Signed)
Patient has not yet been scheduled for bone marrow biopsy. Radiology did make attempt but was unable to contact patient. Called and spoke to patient this am and gave him the number to call to schedule appointment. He stated he would call.  Oncology Nurse Navigator Documentation  Oncology Nurse Navigator Flowsheets 10/08/2020  Abnormal Finding Date -  Confirmed Diagnosis Date -  Diagnosis Status -  Planned Course of Treatment -  Phase of Treatment -  Chemotherapy Actual Start Date: -  Radiation Actual Start Date: -  Radiation Expected End Date: -  Radiation Actual End Date: -  Navigator Follow Up Date: 10/16/2020  Navigator Follow Up Reason: Scan Review  Navigator Location [facility]  Navigator Encounter Type Appt/Treatment Plan Review;Telephone  Telephone Appt Confirmation/Clarification;Outgoing Call  Patient Visit Type MedOnc  Treatment Phase Active Tx  Barriers/Navigation Needs Coordination of Care;Education;Family Concerns  Education -  Interventions Coordination of Care  Acuity Level 2-Minimal Needs (1-2 Barriers Identified)  Referrals -  Coordination of Care Radiology  Education Method -  Support Groups/Services Friends and Family  Time Spent with Patient 30

## 2020-10-09 LAB — PROTEIN ELECTROPHORESIS, SERUM, WITH REFLEX
A/G Ratio: 1.1 (ref 0.7–1.7)
Albumin ELP: 2.8 g/dL — ABNORMAL LOW (ref 2.9–4.4)
Alpha-1-Globulin: 0.4 g/dL (ref 0.0–0.4)
Alpha-2-Globulin: 1.2 g/dL — ABNORMAL HIGH (ref 0.4–1.0)
Beta Globulin: 0.7 g/dL (ref 0.7–1.3)
Gamma Globulin: 0.2 g/dL — ABNORMAL LOW (ref 0.4–1.8)
Globulin, Total: 2.5 g/dL (ref 2.2–3.9)
M-Spike, %: 0.1 g/dL — ABNORMAL HIGH
SPEP Interpretation: 0
Total Protein ELP: 5.3 g/dL — ABNORMAL LOW (ref 6.0–8.5)

## 2020-10-09 LAB — IMMUNOFIXATION REFLEX, SERUM
IgA: 70 mg/dL (ref 61–437)
IgG (Immunoglobin G), Serum: 362 mg/dL — ABNORMAL LOW (ref 603–1613)
IgM (Immunoglobulin M), Srm: 7 mg/dL — ABNORMAL LOW (ref 20–172)

## 2020-10-13 ENCOUNTER — Ambulatory Visit (HOSPITAL_BASED_OUTPATIENT_CLINIC_OR_DEPARTMENT_OTHER)
Admission: RE | Admit: 2020-10-13 | Discharge: 2020-10-13 | Disposition: A | Discharge status: Home / Self Care | Payer: BC Managed Care – PPO | Source: Ambulatory Visit | Attending: Hematology & Oncology | Admitting: Hematology & Oncology

## 2020-10-13 ENCOUNTER — Inpatient Hospital Stay: Payer: BC Managed Care – PPO

## 2020-10-13 ENCOUNTER — Other Ambulatory Visit: Payer: Self-pay

## 2020-10-13 ENCOUNTER — Telehealth: Payer: Self-pay | Admitting: Hematology & Oncology

## 2020-10-13 ENCOUNTER — Ambulatory Visit: Payer: BC Managed Care – PPO

## 2020-10-13 ENCOUNTER — Inpatient Hospital Stay (HOSPITAL_BASED_OUTPATIENT_CLINIC_OR_DEPARTMENT_OTHER): Payer: BC Managed Care – PPO | Admitting: Hematology & Oncology

## 2020-10-13 ENCOUNTER — Telehealth: Payer: Self-pay

## 2020-10-13 ENCOUNTER — Encounter: Payer: Self-pay | Admitting: Hematology & Oncology

## 2020-10-13 VITALS — BP 131/61 | HR 74 | Temp 98.0°F | Resp 18 | Wt 181.5 lb

## 2020-10-13 DIAGNOSIS — C419 Malignant neoplasm of bone and articular cartilage, unspecified: Secondary | ICD-10-CM | POA: Insufficient documentation

## 2020-10-13 DIAGNOSIS — C9000 Multiple myeloma not having achieved remission: Secondary | ICD-10-CM | POA: Diagnosis not present

## 2020-10-13 LAB — CMP (CANCER CENTER ONLY)
ALT: 21 U/L (ref 0–44)
AST: 10 U/L — ABNORMAL LOW (ref 15–41)
Albumin: 3.6 g/dL (ref 3.5–5.0)
Alkaline Phosphatase: 142 U/L — ABNORMAL HIGH (ref 38–126)
Anion gap: 7 (ref 5–15)
BUN: 16 mg/dL (ref 8–23)
CO2: 24 mmol/L (ref 22–32)
Calcium: 8.4 mg/dL — ABNORMAL LOW (ref 8.9–10.3)
Chloride: 102 mmol/L (ref 98–111)
Creatinine: 0.69 mg/dL (ref 0.61–1.24)
GFR, Estimated: >60 mL/min (ref >60)
Glucose, Bld: 117 mg/dL — ABNORMAL HIGH (ref 70–99)
Potassium: 5.1 mmol/L (ref 3.5–5.1)
Sodium: 133 mmol/L — ABNORMAL LOW (ref 135–145)
Total Bilirubin: 0.4 mg/dL (ref 0.3–1.2)
Total Protein: 5.8 g/dL — ABNORMAL LOW (ref 6.5–8.1)

## 2020-10-13 LAB — CBC WITH DIFFERENTIAL (CANCER CENTER ONLY)
Abs Immature Granulocytes: 0.08 10*3/uL — ABNORMAL HIGH (ref 0.00–0.07)
Basophils Absolute: 0 10*3/uL (ref 0.0–0.1)
Basophils Relative: 0 %
Eosinophils Absolute: 0.1 10*3/uL (ref 0.0–0.5)
Eosinophils Relative: 1 %
HCT: 34.4 % — ABNORMAL LOW (ref 39.0–52.0)
Hemoglobin: 11.4 g/dL — ABNORMAL LOW (ref 13.0–17.0)
Immature Granulocytes: 1 %
Lymphocytes Relative: 4 %
Lymphs Abs: 0.2 10*3/uL — ABNORMAL LOW (ref 0.7–4.0)
MCH: 29.7 pg (ref 26.0–34.0)
MCHC: 33.1 g/dL (ref 30.0–36.0)
MCV: 89.6 fL (ref 80.0–100.0)
Monocytes Absolute: 0.5 10*3/uL (ref 0.1–1.0)
Monocytes Relative: 8 %
Neutro Abs: 5.2 10*3/uL (ref 1.7–7.7)
Neutrophils Relative %: 86 %
Platelet Count: 223 10*3/uL (ref 150–400)
RBC: 3.84 MIL/uL — ABNORMAL LOW (ref 4.22–5.81)
RDW: 15.7 % — ABNORMAL HIGH (ref 11.5–15.5)
WBC Count: 6.1 10*3/uL (ref 4.0–10.5)
nRBC: 0 % (ref 0.0–0.2)

## 2020-10-13 IMAGING — US US EXTREM LOW VENOUS*R*
1 series · 13 of 24 positions shown · non-contrast
Comparison: None.

CLINICAL DATA: Right lower extremity pain and edema

EXAM:
RIGHT LOWER EXTREMITY VENOUS DUPLEX ULTRASOUND
TECHNIQUE: Gray-scale sonography with graded compression, as well as color
Doppler and duplex ultrasound were performed to evaluate the right
lower extremity deep venous system from the level of the common
femoral vein and including the common femoral, femoral, profunda
femoral, popliteal and calf veins including the posterior tibial,
peroneal and gastrocnemius veins when visible. The superficial great
saphenous vein was also interrogated. Spectral Doppler was utilized
to evaluate flow at rest and with distal augmentation maneuvers in
the common femoral, femoral and popliteal veins.

[Series 1: us extrem low venous*right* · 13 of 29 slices shown]
[im 1/29]
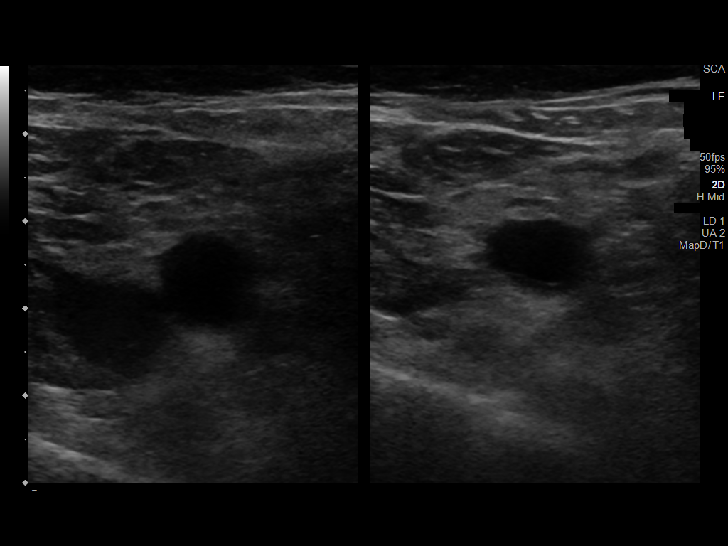
[im 3/29]
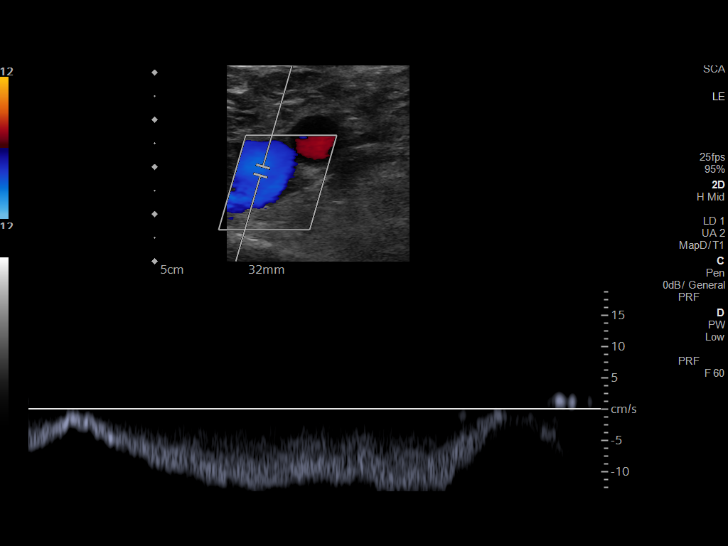
[im 5/29]
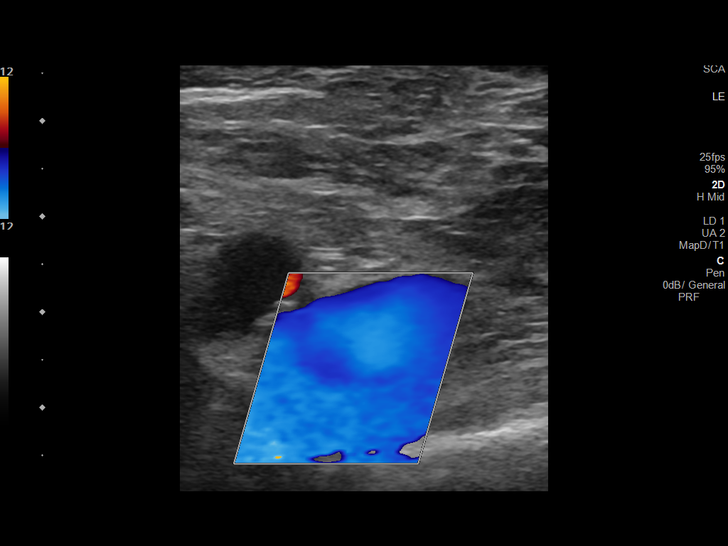
[im 8/29]
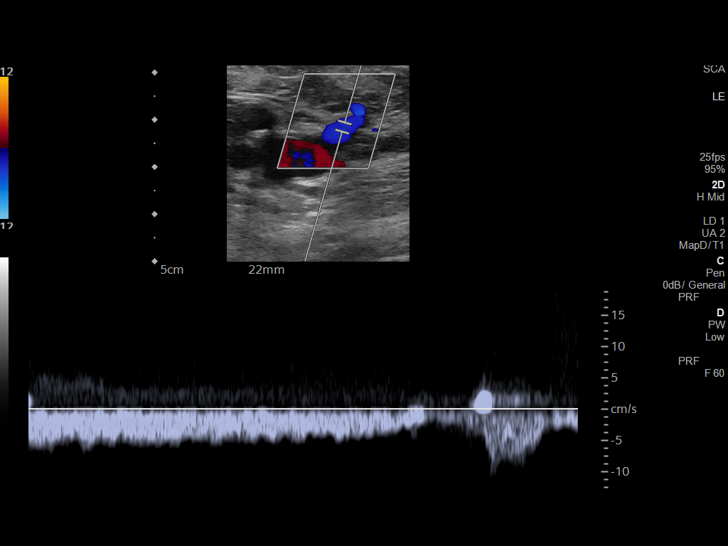
[im 10/29]
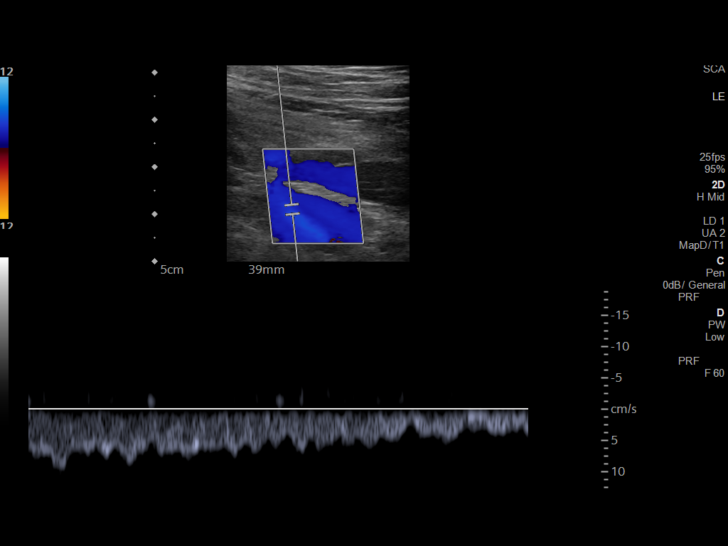
[im 13/29]
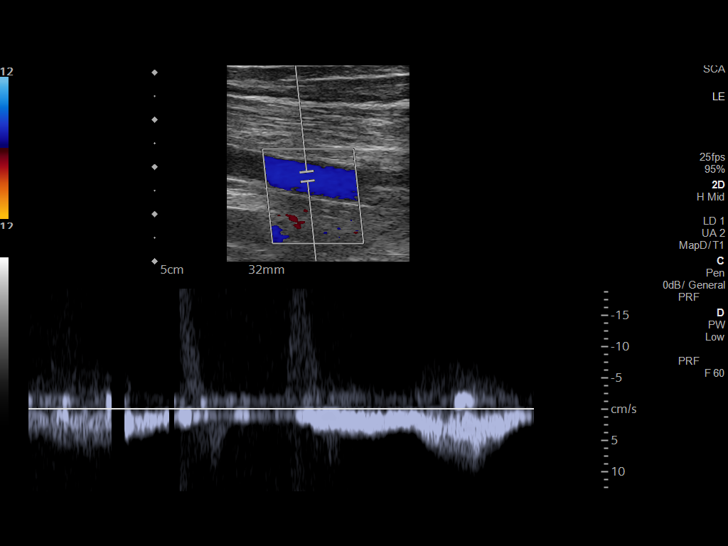
[im 15/29]
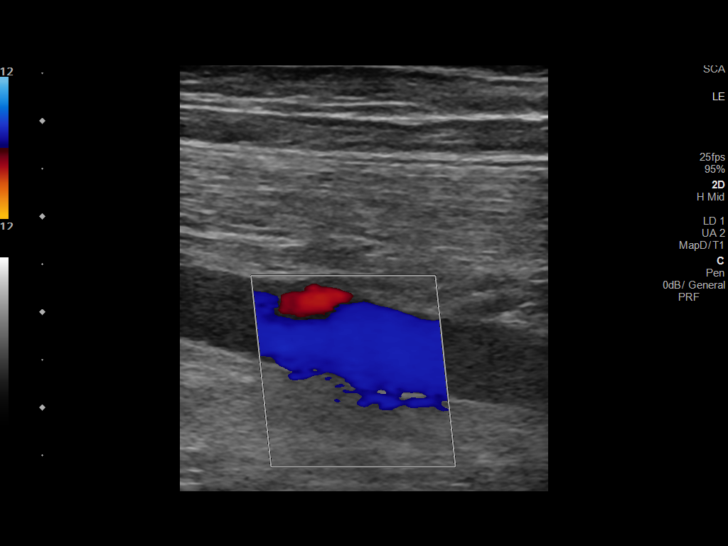
[im 16/29]
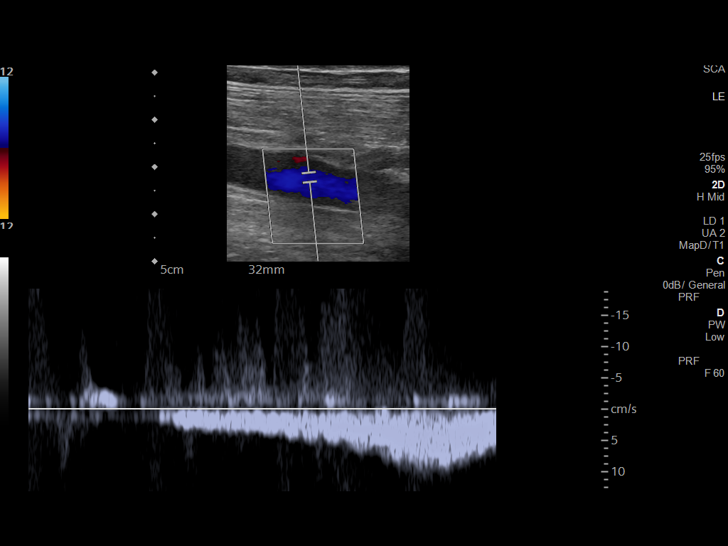
[im 19/29]
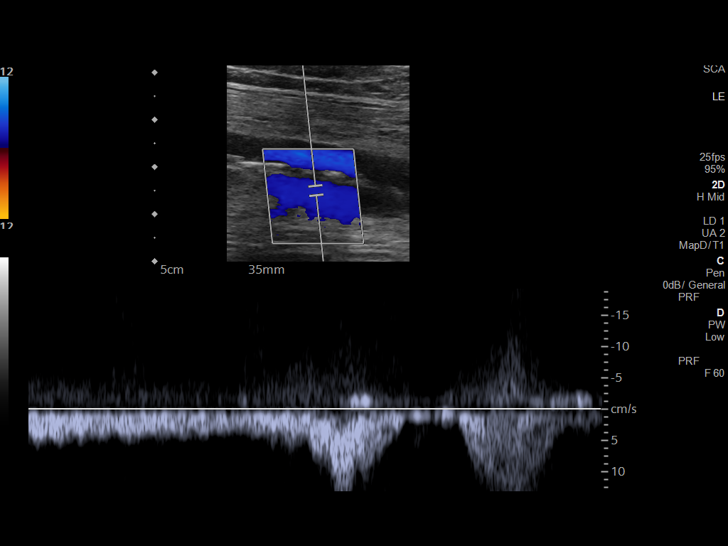
[im 21/29]
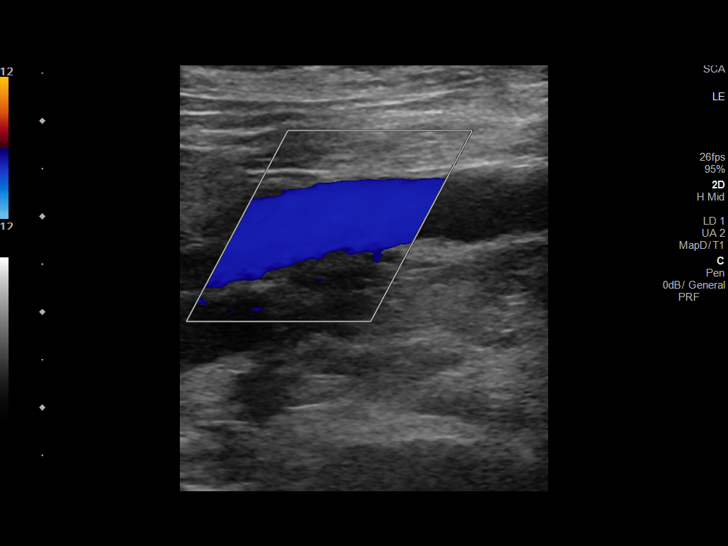
[im 24/29]
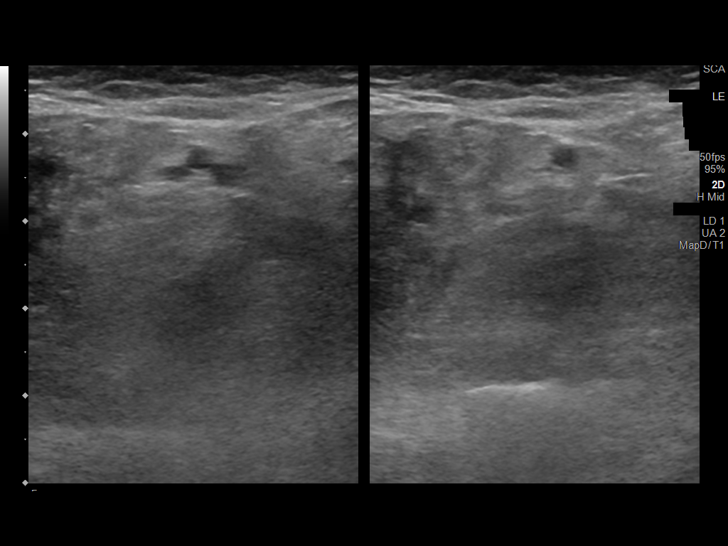
[im 26/29]
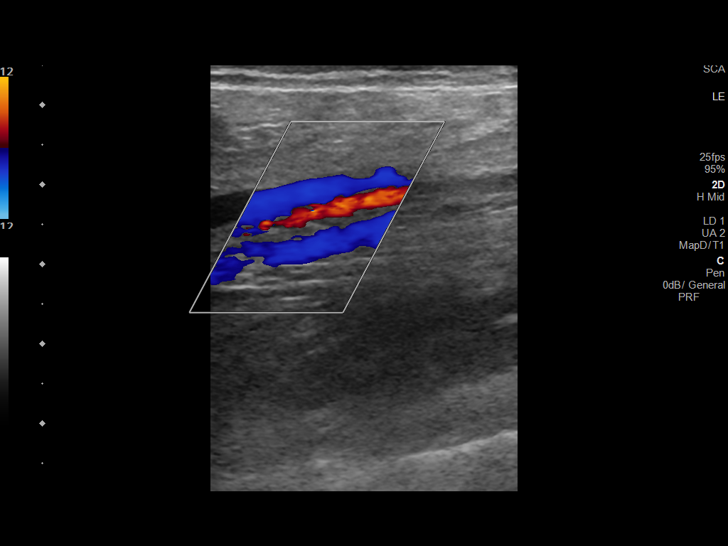
[im 29/29]
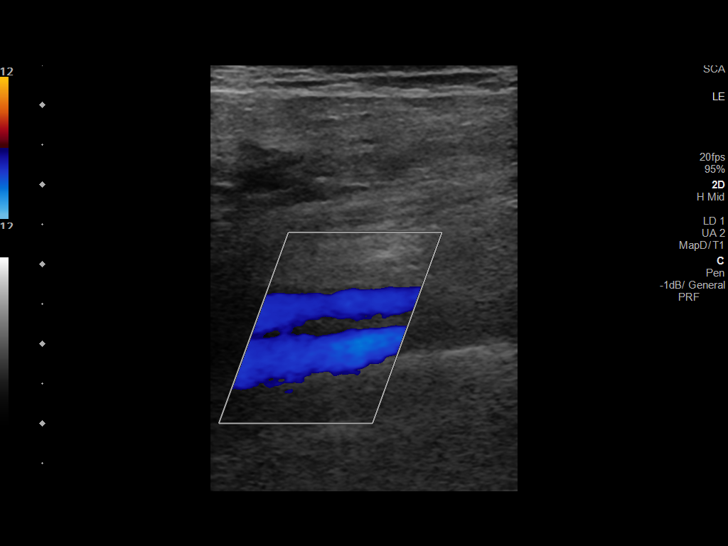

[13 of 24 positions shown; findings below may reference images not displayed]

FINDINGS: Contralateral Common Femoral Vein: Respiratory phasicity is normal
and symmetric with the symptomatic side. No evidence of thrombus.
Normal compressibility.

Common Femoral Vein: No evidence of thrombus. Normal
compressibility, respiratory phasicity and response to augmentation.

Saphenofemoral Junction: No evidence of thrombus. Normal
compressibility and flow on color Doppler imaging.

Profunda Femoral Vein: No evidence of thrombus. Normal
compressibility and flow on color Doppler imaging.

Femoral Vein: No evidence of thrombus. Normal compressibility,
respiratory phasicity and response to augmentation.

Popliteal Vein: No evidence of thrombus. Normal compressibility,
respiratory phasicity and response to augmentation.

Calf Veins: No evidence of thrombus. Normal compressibility and flow
on color Doppler imaging.

Superficial Great Saphenous Vein: No evidence of thrombus. Normal
compressibility.

Venous Reflux:  None.

Other Findings:  None.
IMPRESSION: No evidence of deep venous thrombosis in the right lower extremity.
Left common femoral vein also patent.

## 2020-10-13 IMAGING — DX DG FEMUR 2+V*R*
4 series · 4 of 4 positions shown · non-contrast
Comparison: None.

CLINICAL DATA: Pain.  History of multiple myeloma

EXAM:
RIGHT FEMUR 2 VIEWS

[femur ap (1 of 2)]
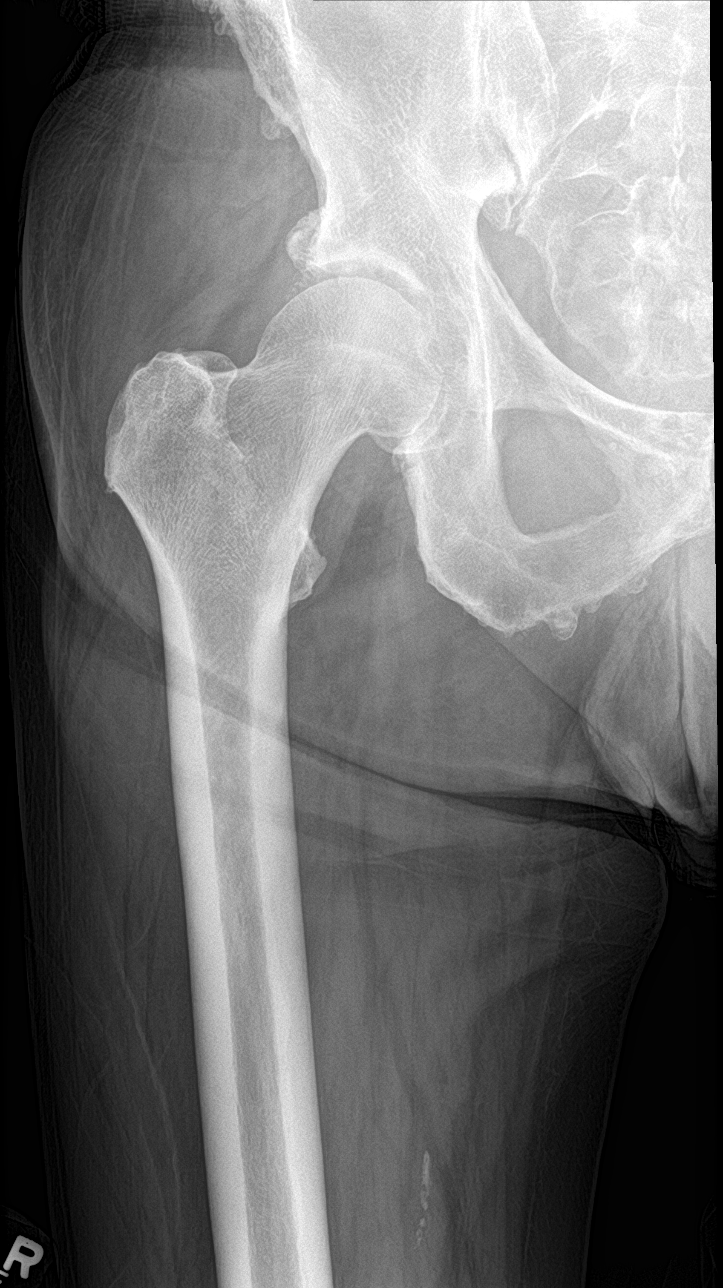

[femur ap (2 of 2)]
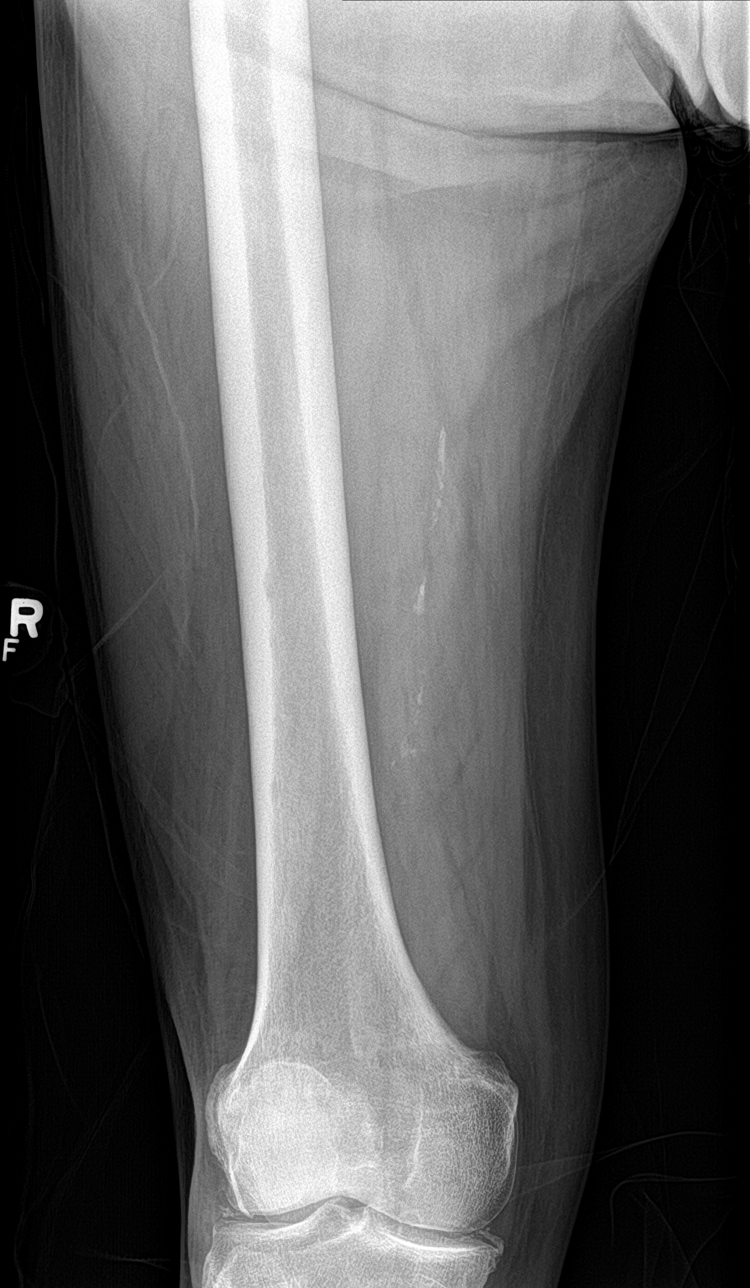

[femur lat (1 of 2)]
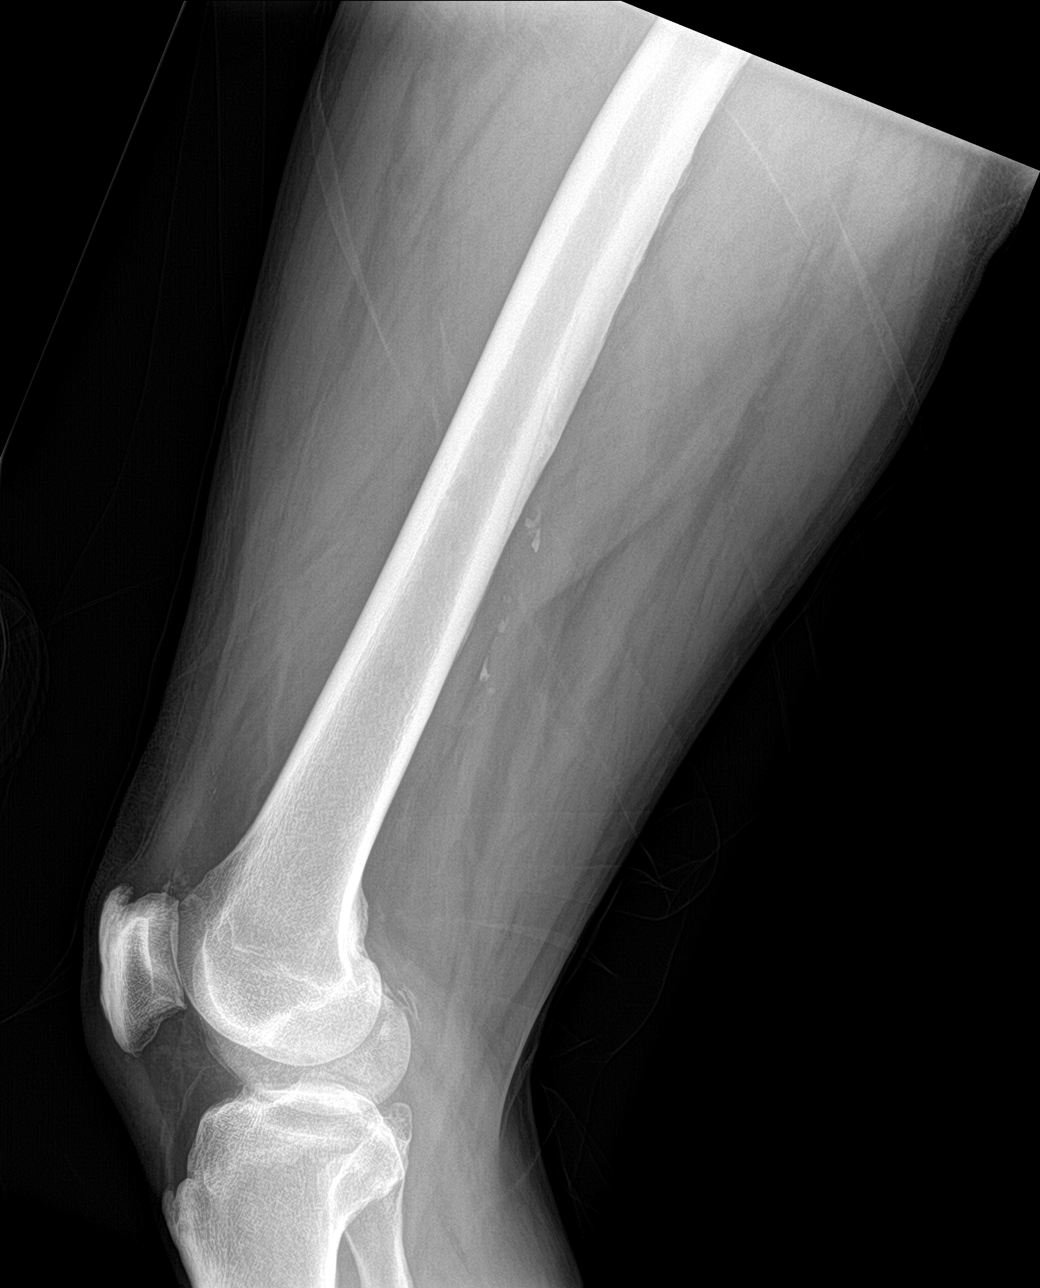

[femur lat (2 of 2)]
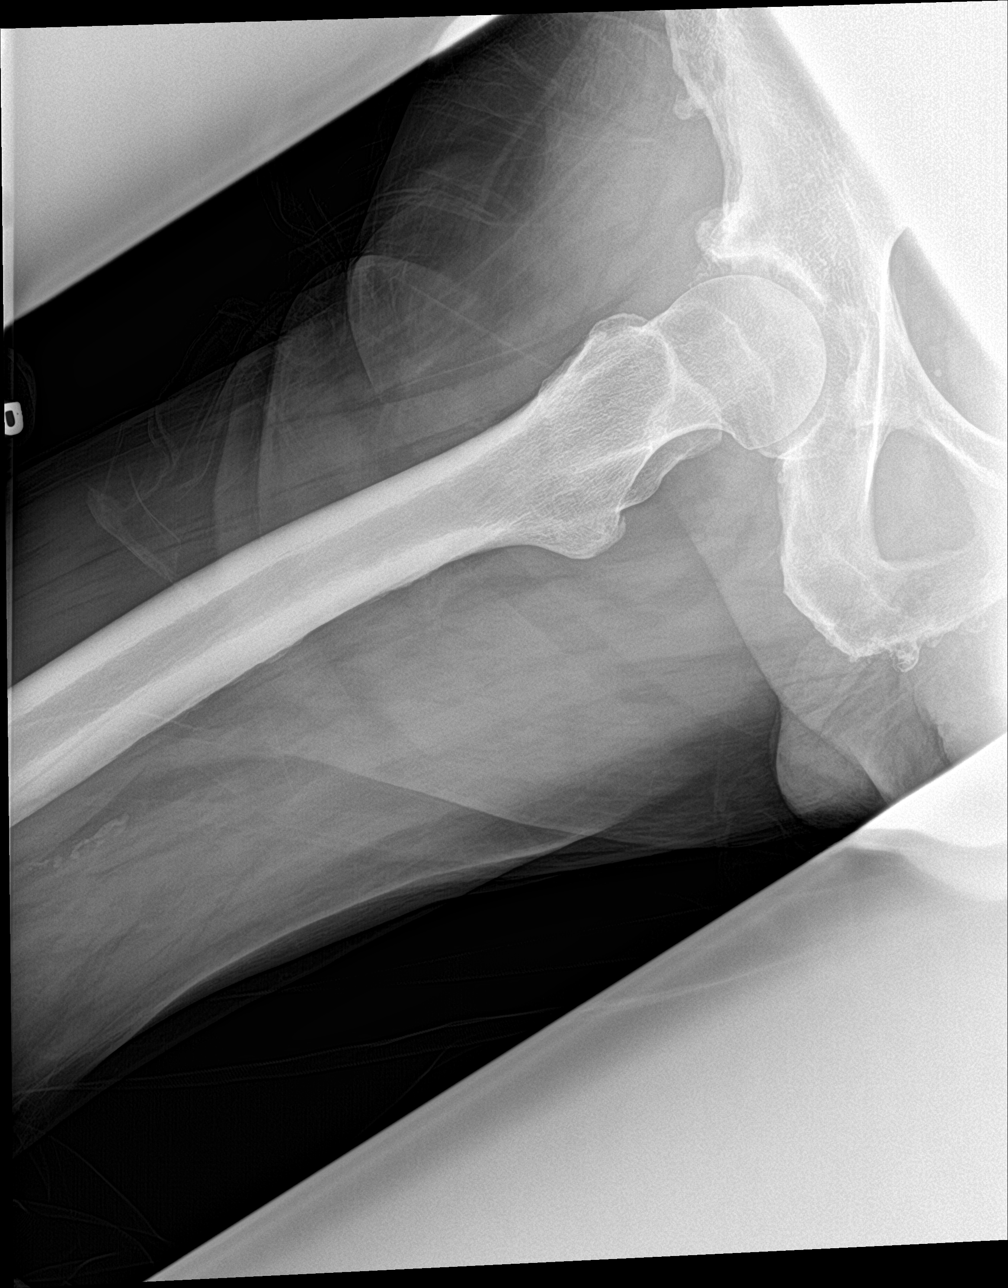

[4 of 4 positions shown; findings below may reference images not displayed]

FINDINGS: Frontal and lateral views were obtained. No blastic or lytic bone
lesions evident. No abnormal periosteal reaction. No fracture or
dislocation. There is chondrocalcinosis in the knee joint region. No
knee joint effusion. There is narrowing of the patellofemoral joint.
There are foci of arterial vascular calcification.
IMPRESSION: 1. No blastic or lytic bone lesions. No abnormal periosteal
reaction.

2. Chondrocalcinosis in the knee joint, a finding that may be seen
with osteoarthritis or calcium pyrophosphate deposition disease.

3.  Narrowing patellofemoral joint.

4.  No fracture or dislocation.

## 2020-10-13 MED ORDER — KETOROLAC TROMETHAMINE 15 MG/ML IJ SOLN
INTRAMUSCULAR | Status: AC
  Filled 2020-10-13: qty 2

## 2020-10-13 MED ORDER — HYDROMORPHONE HCL 1 MG/ML IJ SOLN
INTRAMUSCULAR | Status: AC
  Filled 2020-10-13: qty 2

## 2020-10-13 MED ORDER — SODIUM CHLORIDE 0.9 % IV SOLN
INTRAVENOUS | Status: DC
  Filled 2020-10-13 (×2): qty 250

## 2020-10-13 MED ORDER — PROMETHAZINE HCL 25 MG PO TABS: 25.0000 mg | ORAL_TABLET | Freq: Three times a day (TID) | ORAL | 2 refills | Status: DC | PRN

## 2020-10-13 MED ORDER — KETOROLAC TROMETHAMINE 30 MG/ML IJ SOLN
30.0000 mg | Freq: Once | INTRAMUSCULAR | Status: AC
  Administered 2020-10-13: 30 mg via INTRAVENOUS
  Filled 2020-10-13: qty 1

## 2020-10-13 MED ORDER — FENTANYL 25 MCG/HR TD PT72: 1.0000 | MEDICATED_PATCH | TRANSDERMAL | 0 refills | Status: DC

## 2020-10-13 MED ORDER — METOCLOPRAMIDE HCL 5 MG/ML IJ SOLN
20.0000 mg | Freq: Once | INTRAVENOUS | Status: AC
  Administered 2020-10-13: 20 mg via INTRAVENOUS
  Filled 2020-10-13: qty 4

## 2020-10-13 MED ORDER — HYDROMORPHONE HCL 1 MG/ML IJ SOLN
2.0000 mg | Freq: Once | INTRAMUSCULAR | Status: AC
  Administered 2020-10-13: 2 mg via INTRAVENOUS

## 2020-10-13 NOTE — Telephone Encounter (Signed)
Appts added per 12/14 staff message. Patient is aware of time

## 2020-10-13 NOTE — Progress Notes (Signed)
Hematology and Oncology Follow Up Visit  Bruce Smith [id number] 09-19-1951 69 y.o. 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  His wife called his wife saying that he was having more problems with weight loss.  He is having more problems with nausea.  He was having issues with new pain in the right thigh.  We got him in.  I am just surprised that he is having the difficulties given the protocol that we have him on.  I am not sure as to what the culprit is for his nausea.  I wonder if the Revlimid might be causing some problems for him.  We may want to go ahead and just stop this for right now.  We did check his right thigh.  I did a plain film.  There is no fracture.  There is no lytic lesions.  I will have to get a Doppler to make sure there is no blood clot given the fact that he is on Revlimid.  I think that he probably needs some IV fluids.  He has had no fever.  There has been no bleeding.  He has had no diarrhea.  He is set up for his bone marrow test next week.  Currently, his performance status is ECOG 1.    Medications:  Current Outpatient Medications:  .  aspirin 81 MG EC tablet, Take 81 mg by mouth daily. Swallow whole., Disp: , Rfl:  .  atorvastatin (LIPITOR) 20 MG tablet, Take 20 mg by mouth daily., Disp: , Rfl:  .  atorvastatin (LIPITOR) 40 MG tablet, TAKE 1/2 TABLET BY MOUTH DAILY FOR CHOLESTEROL, Disp: , Rfl:  .  baclofen (LIORESAL) 10 MG tablet, Take 1 tablet (10 mg total) by mouth 3 (three) times daily. As  needed for muscle spasm, Disp: 50 tablet, Rfl: 0 .  Coenzyme Q10 (CO Q-10) 200 MG CAPS, Take 200 mg by mouth daily., Disp: , Rfl:  .  cyclobenzaprine (FLEXERIL) 10 MG tablet, Take 1 tablet (10 mg total) by mouth 3 (three) times daily as needed for muscle spasms., Disp: 30 tablet, Rfl: 0 .  dexamethasone (DECADRON) 4 MG tablet, Take 1 tablet (4 mg total) by mouth daily. Take for 3 days after chemotherapy treatment. Take with meals., Disp: 30 tablet, Rfl: 2 .  dronabinol (MARINOL) 5 MG capsule, Take 1 capsule (5 mg total) by mouth 2 (two) times daily before a meal., Disp: 60 capsule, Rfl: 0 .  famciclovir (FAMVIR) 500 MG tablet, Take 1 tablet (500 mg total) by mouth daily., Disp: 30 tablet, Rfl: 8 .  hydrOXYzine (ATARAX/VISTARIL) 10 MG tablet, Take 1 tablet (10 mg total) by mouth 3 (three) times daily as needed., Disp: 30 tablet, Rfl: 0 .  lenalidomide (REVLIMID) 25 MG capsule, Take 1 capsule (25 mg total) by mouth daily. 14 days on, then 14 days off. [employer] Auth # [id number], Disp: 14 capsule, Rfl: 0 .  lisinopril (ZESTRIL) 5 MG tablet, Take 7.5 mg by mouth daily., Disp: , Rfl:  .  LORazepam (ATIVAN) 0.5  MG tablet, Take 1 tablet (0.5 mg total) by mouth every 6 (six) hours as needed for anxiety (nausea)., Disp: 40 tablet, Rfl: 0 .  metoprolol succinate (TOPROL-XL) 50 MG 24 hr tablet, Take 50 mg by mouth daily., Disp: , Rfl:  .  morphine (MS CONTIN) 15 MG 12 hr tablet, Take 1 tablet (15 mg total) by mouth every 12 (twelve) hours., Disp: 60 tablet, Rfl: 0 .  morphine (MSIR) 15 MG tablet, Take 1 tablet (15 mg total) by mouth every 6 (six) hours as needed for severe pain., Disp: 90 tablet, Rfl: 0 .  nitroGLYCERIN (NITROSTAT) 0.4 MG SL tablet, Place 0.4 mg under the tongue every 5 (five) minutes as needed for chest pain., Disp: , Rfl:  .  OLANZapine (ZYPREXA) 10 MG tablet, Take 1 tablet (10 mg total) by mouth at bedtime., Disp: 30 tablet, Rfl: 2 .  omeprazole (PRILOSEC) 40 MG capsule, Take by mouth., Disp: ,  Rfl:  .  ondansetron (ZOFRAN) 8 MG tablet, Take 1 tablet (8 mg total) by mouth every 8 (eight) hours as needed for nausea or vomiting., Disp: 20 tablet, Rfl: 1 .  Polyethylene Glycol 3350 (MIRALAX PO), Take 2 Scoops by mouth daily., Disp: , Rfl:  .  Polyethylene Glycol 3350 (MIRALAX PO), Take by mouth as needed., Disp: , Rfl:  .  prasugrel (EFFIENT) 10 MG TABS tablet, Take 10 mg by mouth daily., Disp: , Rfl:  .  pregabalin (LYRICA) 150 MG capsule, Take 150 mg by mouth 2 (two) times daily as needed (Nerve pain). , Disp: , Rfl:  .  prochlorperazine (COMPAZINE) 10 MG tablet, Take 1 tablet (10 mg total) by mouth every 6 (six) hours as needed for nausea or vomiting., Disp: 10 tablet, Rfl: 0 .  rivaroxaban (XARELTO) 10 MG TABS tablet, Take 1 tablet (10 mg total) by mouth daily., Disp: 30 tablet, Rfl: 0 .  sennosides-docusate sodium (SENOKOT-S) 8.6-50 MG tablet, Take 2 tablets by mouth daily., Disp: 30 tablet, Rfl: 1 .  silver sulfADIAZINE (SILVADENE) 1 % cream, Apply 1 application topically 2 (two) times daily., Disp: 50 g, Rfl: 0 .  traMADol (ULTRAM) 50 MG tablet, Take 1 tablet (50 mg total) by mouth 3 (three) times daily as needed for moderate pain., Disp: 90 tablet, Rfl: 0 .  triamterene-hydrochlorothiazide (MAXZIDE) 75-50 MG tablet, Take 1 tablet by mouth daily., Disp: 30 tablet, Rfl: 4 .  acetaminophen (TYLENOL) 500 MG tablet, Take 1,000 mg by mouth every 6 (six) hours. (Patient not taking: No sig reported), Disp: , Rfl:  .  fentaNYL (DURAGESIC) 25 MCG/HR, Place 1 patch onto the skin every 3 (three) days., Disp: 10 patch, Rfl: 0  Current Facility-Administered Medications:  .  0.9 %  sodium chloride infusion, , Intravenous, Continuous, Esten Dollar, Rudell Cobb, MD, Last Rate: 500 mL/hr at 10/13/20 1347, New Bag at 10/13/20 1347 .  HYDROmorphone (DILAUDID) injection 2 mg, 2 mg, Intravenous, Once, Marisal Swarey, Rudell Cobb, MD .  metoCLOPramide (REGLAN) 20 mg in dextrose 5 % 50 mL IVPB, 20 mg, Intravenous, Once,  Harlym Gehling, Rudell Cobb, MD  Allergies: No Known Allergies  Past Medical History, Surgical history, Social history, and Family History were reviewed and updated.  Review of Systems: Review of Systems  Constitutional: Negative.   HENT:  Negative.   Eyes: Negative.   Respiratory: Negative.   Cardiovascular: Negative.   Gastrointestinal: Negative.   Endocrine: Negative.   Genitourinary: Negative.    Musculoskeletal: Positive for arthralgias.  Skin: Negative.   Neurological: Negative.  Hematological: Negative.   Psychiatric/Behavioral: Negative.     Physical Exam:  weight is 181 lb 8 oz (82.3 kg). His oral temperature is 98 F (36.7 C). His blood pressure is 131/61 and his pulse is 74. His respiration is 18 and oxygen saturation is 100%.   Wt Readings from Last 3 Encounters:  10/13/20 181 lb 8 oz (82.3 kg)  10/06/20 188 lb 4 oz (85.4 kg)  09/10/20 201 lb (91.2 kg)    Physical Exam Vitals reviewed.  HENT:     Head: Normocephalic and atraumatic.  Eyes:     Pupils: Pupils are equal, round, and reactive to light.  Cardiovascular:     Rate and Rhythm: Normal rate and regular rhythm.     Heart sounds: Normal heart sounds.  Pulmonary:     Effort: Pulmonary effort is normal.     Breath sounds: Normal breath sounds.  Abdominal:     General: Bowel sounds are normal.     Palpations: Abdomen is soft.  Musculoskeletal:        General: No tenderness or deformity. Normal range of motion.     Cervical back: Normal range of motion.     Comments: He has the left hip surgical scar that is healed.  He has some decreased range of motion of the left hip area.  He has some discomfort in the left shoulder blade.  He does seem to have little bit of better range of motion in this area.  Lymphadenopathy:     Cervical: No cervical adenopathy.  Skin:    General: Skin is warm and dry.     Findings: No erythema or rash.  Neurological:     Mental Status: He is alert and oriented to person, place, and  time.  Psychiatric:        Behavior: Behavior normal.        Thought Content: Thought content normal.        Judgment: Judgment normal.    Lab Results  Component Value Date   WBC 6.1 10/13/2020   HGB 11.4 (L) 10/13/2020   HCT 34.4 (L) 10/13/2020   MCV 89.6 10/13/2020   PLT 223 10/13/2020     Chemistry      Component Value Date/Time   NA 133 (L) 10/13/2020 1029   K 5.1 10/13/2020 1029   CL 102 10/13/2020 1029   CO2 24 10/13/2020 1029   BUN 16 10/13/2020 1029   CREATININE 0.69 10/13/2020 1029      Component Value Date/Time   CALCIUM 8.4 (L) 10/13/2020 1029   ALKPHOS 142 (H) 10/13/2020 1029   AST 10 (L) 10/13/2020 1029   ALT 21 10/13/2020 1029   BILITOT 0.4 10/13/2020 1029      Impression and Plan: Bruce Smith is a very nice 69 year old white male.  He has kappa light chain myeloma.  I was supposed not to surprised by this as light chain myeloma does tend to involve the bones.  He has had radiation therapy.    I just feel bad that he is having such a hard time.  Again, I do not know if this might be the Revlimid.  I will stop the Revlimid for right now.  At least, his calcium is not low.  He got calcium last time he was here.  I would try him on a Duragesic patch.  Maybe, having something that is time-released might help with the nausea so he does not get "peaks" of pain medication.  I really just  want his quality life to be better.  I really need to get better so that he will be able to go for transplant if we find that his bone marrow biopsy is improved, which I think it will be.  This is very complicated.  This is really going take quite a bit of time to sort out so we can get him where he needs to be and get him feeling better.  I have thankful that his wife is so supportive and she really is making sure that he gets the attention that he deserves.    Volanda Napoleon, MD 12/14/20211:52 PM

## 2020-10-13 NOTE — Telephone Encounter (Signed)
Received VM from pt's spouse, Maudie Mercury stating that pt has had severe rt leg pain over past 2-3 days, extending from knee to thigh.   Upon call back, Maudie Mercury reports pt's pain is throbbing "like a toothache." Denies redness, edema, temperature change. Maudie Mercury reports pt fell the day before Thanksgiving, but did not begin experiencing pain until over this past weekend. Pt has not been taking MSContin or MS IR routinely d/t nausea. Kim states Kaston is taking Tramadol and Tylenol around the clock and did take MSIR at bedtime last night because the pain was worsening. Pt is using marijuana therapeutically for nausea and has had to increase the frequency to about every 2 hours.  Maudie Mercury also reports pt has lost 13lbs in a month and continues to struggle with altered appetite and only ate jell-o yesterday.   Per Dr [name], pt to come in today for evaluation. Kim aware and agrees. dph

## 2020-10-13 NOTE — Addendum Note (Signed)
Addended by: Jalene Mullet on: 10/13/2020 08:20 AM   Modules accepted: Orders

## 2020-10-13 NOTE — Patient Instructions (Signed)

## 2020-10-13 NOTE — Telephone Encounter (Signed)
No 10/13/20 LOS, pt has return appt 12/21.21 in place... AOM.

## 2020-10-14 ENCOUNTER — Encounter: Payer: Self-pay | Admitting: *Deleted

## 2020-10-14 ENCOUNTER — Other Ambulatory Visit: Payer: Self-pay | Admitting: *Deleted

## 2020-10-14 DIAGNOSIS — C9000 Multiple myeloma not having achieved remission: Secondary | ICD-10-CM

## 2020-10-14 DIAGNOSIS — C419 Malignant neoplasm of bone and articular cartilage, unspecified: Secondary | ICD-10-CM

## 2020-10-14 MED ORDER — NALOXONE HCL 4 MG/0.1ML NA LIQD
NASAL | 1 refills | Status: DC
Start: 2020-10-14 — End: 2020-11-01

## 2020-10-15 ENCOUNTER — Other Ambulatory Visit: Payer: Self-pay | Admitting: Hematology & Oncology

## 2020-10-15 ENCOUNTER — Other Ambulatory Visit: Payer: Self-pay | Admitting: Radiology

## 2020-10-15 DIAGNOSIS — C9000 Multiple myeloma not having achieved remission: Secondary | ICD-10-CM

## 2020-10-15 LAB — UPEP/UIFE/LIGHT CHAINS/TP, 24-HR UR
% BETA, Urine: 0 %
ALPHA 1 URINE: 0 %
Albumin, U: 100 %
Alpha 2, Urine: 0 %
Free Kappa Lt Chains,Ur: 9.3 mg/L (ref 0.63–113.79)
Free Kappa/Lambda Ratio: 4.25 (ref 1.03–31.76)
Free Lambda Lt Chains,Ur: 2.19 mg/L (ref 0.47–11.77)
GAMMA GLOBULIN URINE: 0 %
Total Protein, Urine-Ur/day: 92 mg/24 hr (ref 30–150)
Total Protein, Urine: 9.2 mg/dL
Total Volume: 1000

## 2020-10-16 ENCOUNTER — Telehealth: Payer: Self-pay | Admitting: *Deleted

## 2020-10-16 ENCOUNTER — Other Ambulatory Visit: Payer: Self-pay

## 2020-10-16 ENCOUNTER — Encounter: Payer: Self-pay | Admitting: *Deleted

## 2020-10-16 ENCOUNTER — Ambulatory Visit (HOSPITAL_COMMUNITY)
Admission: RE | Admit: 2020-10-16 | Discharge: 2020-10-16 | Disposition: A | Discharge status: Home / Self Care | Payer: BC Managed Care – PPO | Source: Ambulatory Visit | Attending: Hematology & Oncology | Admitting: Hematology & Oncology

## 2020-10-16 DIAGNOSIS — C9000 Multiple myeloma not having achieved remission: Secondary | ICD-10-CM | POA: Diagnosis present

## 2020-10-16 LAB — GLUCOSE, CAPILLARY: Glucose-Capillary: 109 mg/dL — ABNORMAL HIGH (ref 70–99)

## 2020-10-16 IMAGING — CT NM PET TUM IMG INITIAL (PI) WHOLE BODY
1 of 8 series · 3 of 25 positions shown · non-contrast
Comparison: PET-CT [DATE]

CLINICAL DATA: Subsequent treatment strategy for multiple myeloma.

EXAM:
NUCLEAR MEDICINE PET WHOLE BODY
TECHNIQUE: 8.8 mCi F-18 FDG was injected intravenously. Full-ring PET imaging
was performed from the head to foot after the radiotracer. CT data
was obtained and used for attenuation correction and anatomic
localization.
Fasting blood glucose: 109 mg/dl

[Series 4: ct wb 5.0 hd_fov · axial · 5.0mm · 1.21mm/px · z∈[+627,+1527]mm · 3 of 451 slices shown]
[im 113/451  soft-tissue]
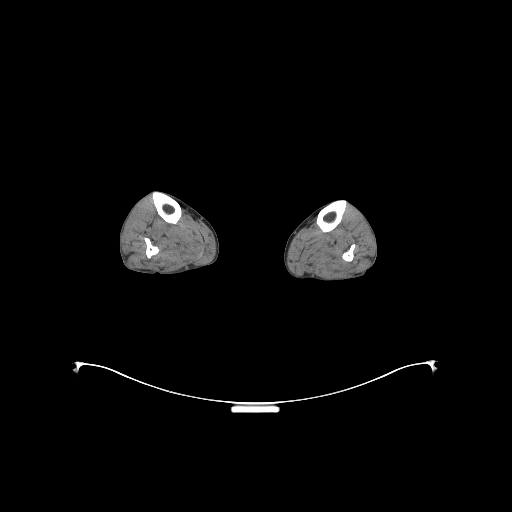
[im 226/451  soft-tissue]
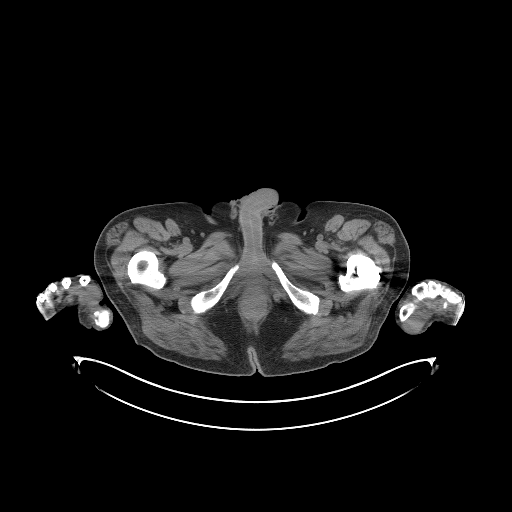
[im 338/451  soft-tissue]
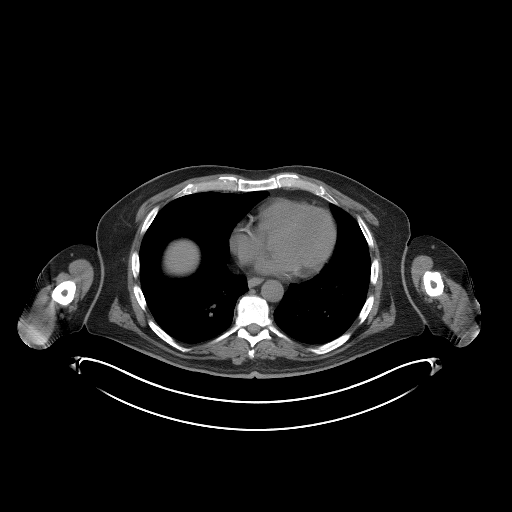

[3 of 25 positions shown; findings below may reference images not displayed]

FINDINGS: Mediastinal blood pool activity: SUV max

HEAD/NECK: No hypermetabolic activity in the scalp. No
hypermetabolic cervical lymph nodes.

Incidental CT findings: none

CHEST: Large area of significant radiation change involving the left
lateral lung this is related to the left rib and left scapular
radiation therapy. It is markedly hypermetabolic with SUV max of
9.09. The left rib lesion has near completely resolved. The left
scapular lesion demonstrates significant interval healing changes
with bony ingrowth and sclerotic changes.

No enlarged or hypermetabolic mediastinal or hilar lymph nodes and
no axillary or supraclavicular adenopathy. Improved osseous
myelomatous changes involving the right shoulder including the
humeral head and right scapula. Minimal residual hypermetabolism
with SUV max of 3.0 in the humeral neck area. This was previously
9.1. The SUV max in the left scapular lesion is 3.69 and was
previously 15.13.

Several rib lesions were hypermetabolic on the prior study and these
also show significant improvement.

Incidental CT findings: none

ABDOMEN/PELVIS: No abnormal hypermetabolic activity within the
liver, pancreas, adrenal glands, or spleen. No hypermetabolic lymph
nodes in the abdomen or pelvis.

Hypermetabolism noted in the anorectal region which could be due to
hemorrhoids. SUV max is 5.34. No CT correlate is demonstrated.

Incidental CT findings: Stable advanced vascular calcifications.

SKELETON: Since the prior study the patient has had hardware placed
in the left hip. The destructive myelomatous changes seen on the
prior PET have improved. The bilateral sacral and iliac disease had
an SUV max of 6.19 and this is now 3.30. Some sclerotic healing
changes are also noted on the CT scan.

The lesion in the left femoral neck had an SUV max of 4.91. This is
difficult to assist now due to the hardware but appears improved.

Patient had significant lumbar myelomatous disease on the prior
study. SUV max was 9.71. SUV max is now 3.67 and there is
significant healing changes.

I do not see any new osseous lesions.

Incidental CT findings: none

EXTREMITIES: No myelomatous lesions are identified in the lower
extremities. The upper extremity show improvement as detailed above.

Incidental CT findings: none
IMPRESSION: 1. Significant interval improved PET scan when compared to the prior
study. Findings suggest an excellent response to treatment. No new
or progressive findings.
2. Large area of significant radiation pneumonitis involving the
left lung.

## 2020-10-16 MED ORDER — FLUDEOXYGLUCOSE F - 18 (FDG) INJECTION
8.8000 | Freq: Once | INTRAVENOUS | Status: AC | PRN
  Administered 2020-10-16: 8.8 via INTRAVENOUS

## 2020-10-16 NOTE — Telephone Encounter (Signed)
Notified pt of results. Pt verbalized understanding.

## 2020-10-16 NOTE — Progress Notes (Signed)
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

## 2020-10-16 NOTE — Telephone Encounter (Signed)
-----   Message from Volanda Napoleon, MD sent at 10/16/2020  3:30 PM EST ----- Call - the PET scan is [facility] better!!!  The bones are better!!!  Less myeloma in the bones!!  Laurey Arrow

## 2020-10-19 ENCOUNTER — Ambulatory Visit (HOSPITAL_COMMUNITY)
Admission: RE | Admit: 2020-10-19 | Discharge: 2020-10-19 | Disposition: A | Discharge status: Home / Self Care | Payer: BC Managed Care – PPO | Source: Ambulatory Visit | Attending: Hematology & Oncology | Admitting: Hematology & Oncology

## 2020-10-19 ENCOUNTER — Other Ambulatory Visit: Payer: Self-pay

## 2020-10-19 ENCOUNTER — Encounter (HOSPITAL_COMMUNITY): Payer: Self-pay

## 2020-10-19 DIAGNOSIS — C9000 Multiple myeloma not having achieved remission: Secondary | ICD-10-CM | POA: Diagnosis not present

## 2020-10-19 DIAGNOSIS — D649 Anemia, unspecified: Secondary | ICD-10-CM | POA: Insufficient documentation

## 2020-10-19 LAB — CBC WITH DIFFERENTIAL/PLATELET
Abs Immature Granulocytes: 0.02 10*3/uL (ref 0.00–0.07)
Basophils Absolute: 0 10*3/uL (ref 0.0–0.1)
Basophils Relative: 1 %
Eosinophils Absolute: 0.2 10*3/uL (ref 0.0–0.5)
Eosinophils Relative: 3 %
HCT: 32.6 % — ABNORMAL LOW (ref 39.0–52.0)
Hemoglobin: 10.7 g/dL — ABNORMAL LOW (ref 13.0–17.0)
Immature Granulocytes: 0 %
Lymphocytes Relative: 3 %
Lymphs Abs: 0.2 10*3/uL — ABNORMAL LOW (ref 0.7–4.0)
MCH: 29.8 pg (ref 26.0–34.0)
MCHC: 32.8 g/dL (ref 30.0–36.0)
MCV: 90.8 fL (ref 80.0–100.0)
Monocytes Absolute: 0.8 10*3/uL (ref 0.1–1.0)
Monocytes Relative: 12 %
Neutro Abs: 5.5 10*3/uL (ref 1.7–7.7)
Neutrophils Relative %: 81 %
Platelets: 281 10*3/uL (ref 150–400)
RBC: 3.59 MIL/uL — ABNORMAL LOW (ref 4.22–5.81)
RDW: 15.9 % — ABNORMAL HIGH (ref 11.5–15.5)
WBC: 6.7 10*3/uL (ref 4.0–10.5)
nRBC: 0 % (ref 0.0–0.2)

## 2020-10-19 LAB — PROTIME-INR
INR: 1.1 (ref 0.8–1.2)
Prothrombin Time: 13.9 seconds (ref 11.4–15.2)

## 2020-10-19 IMAGING — CT CT BIOPSY
1 of 4 series · 14 of 28 positions shown, 18 images · non-contrast
Comparison: none

INDICATION: History of multiple myeloma, currently undergoing treatment. Please
perform CT-guided bone marrow biopsy and aspiration to evaluate for
response to therapy.

[Series 2: i-spiral 5.0 br40 · axial · 0.98mm/px · z∈[+1163,+1236]mm · 14 of 25 slices shown, 18 images]
[im 2/25  mediastinal]
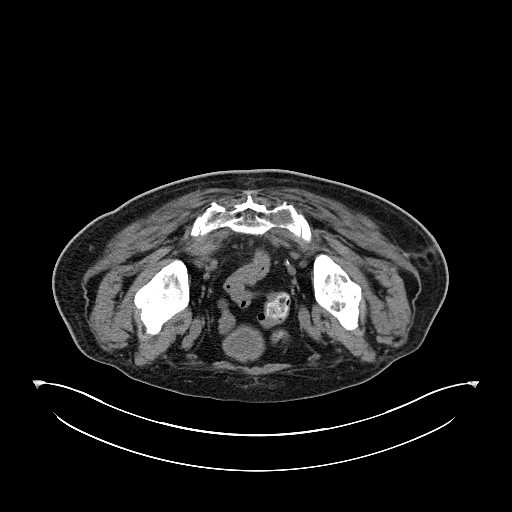
[im 2/25  lung]
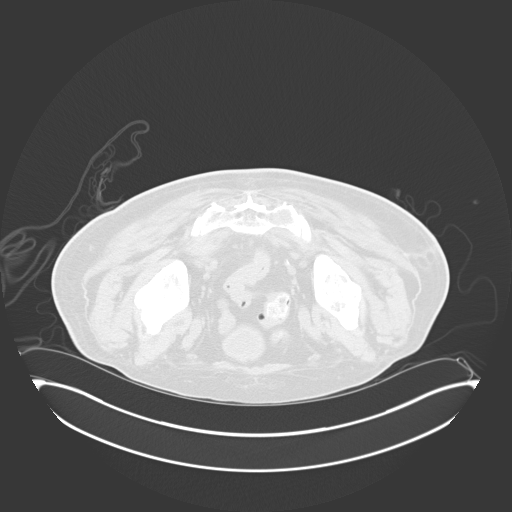
[im 3/25  lung]
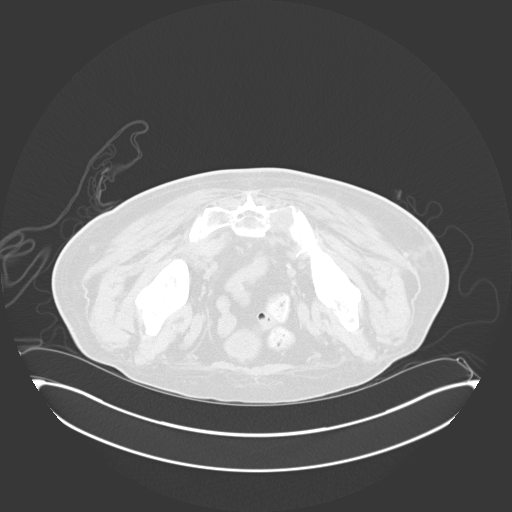
[im 6/25  lung]
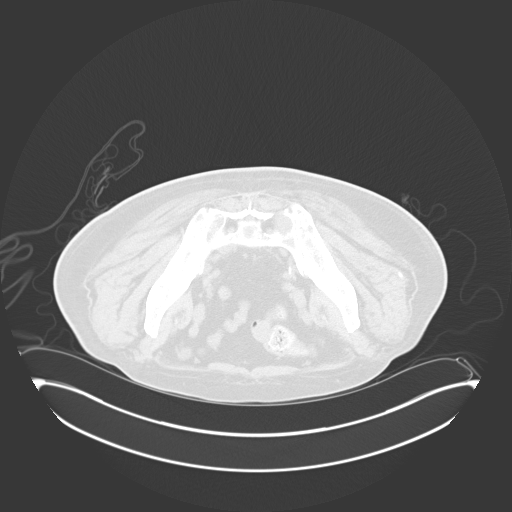
[im 7/25  lung]
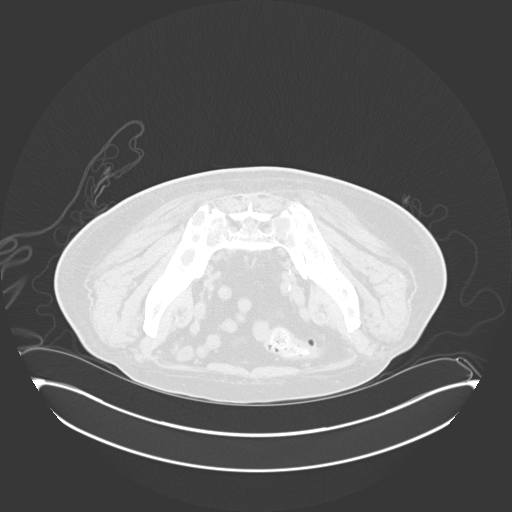
[im 9/25  mediastinal]
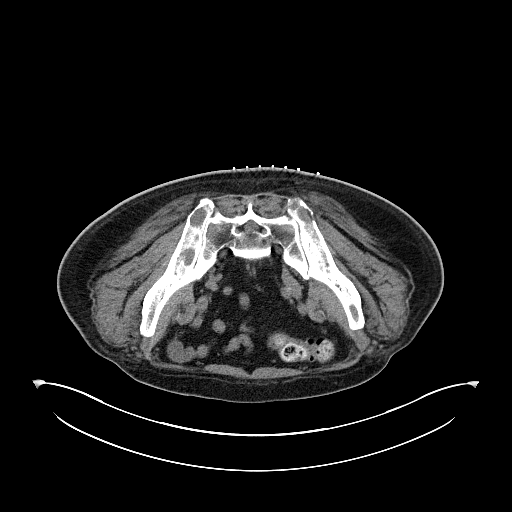
[im 9/25  lung]
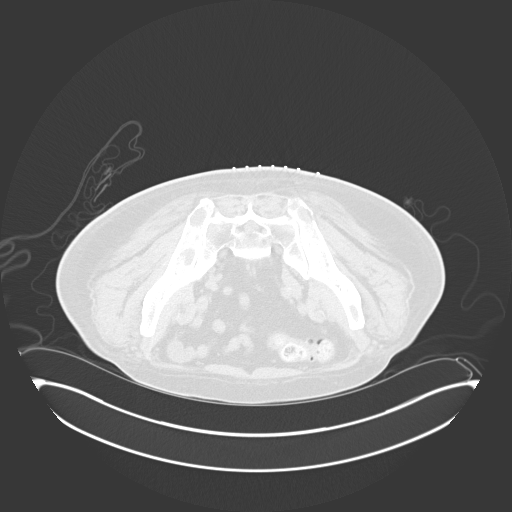
[im 10/25  lung]
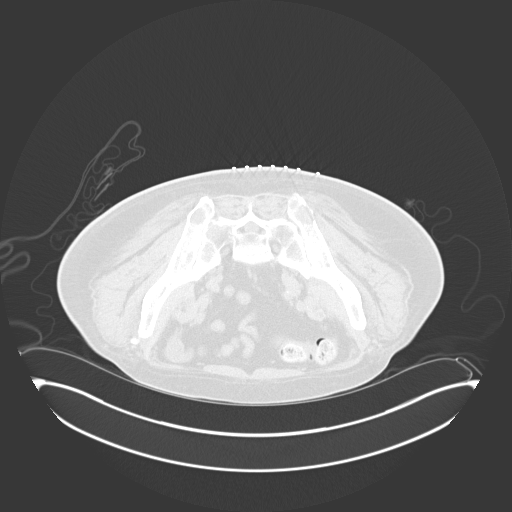
[im 11/25  lung]
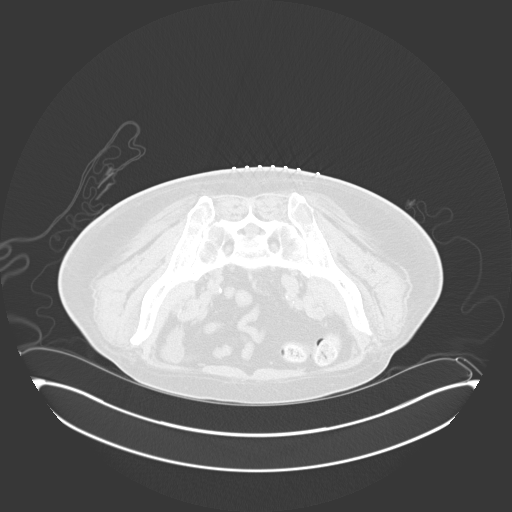
[im 14/25  lung]
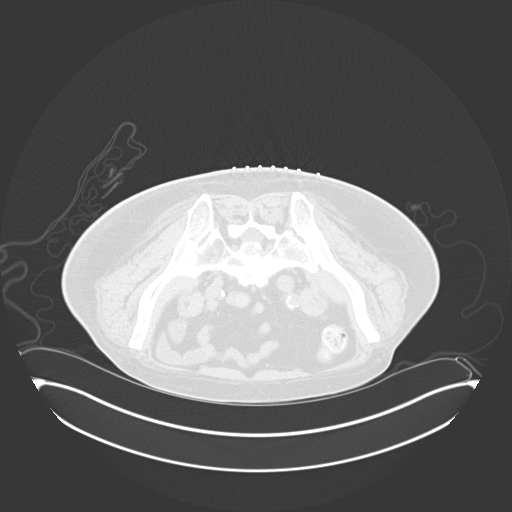
[im 15/25  mediastinal]
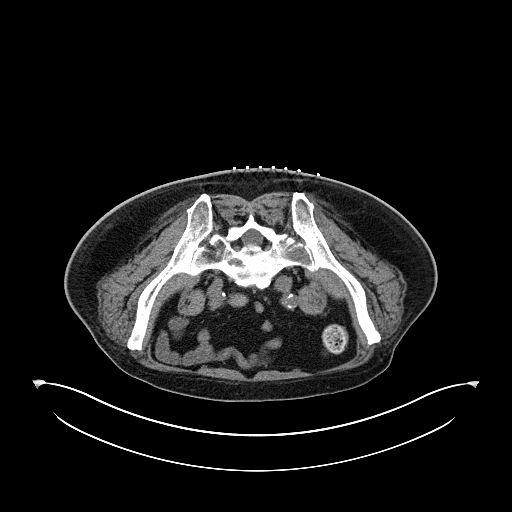
[im 15/25  lung]
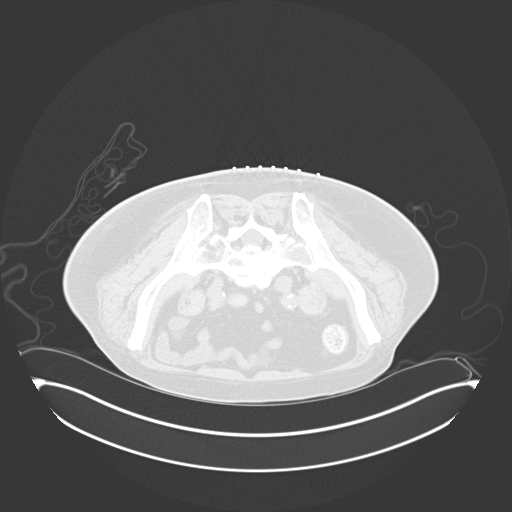
[im 17/25  lung]
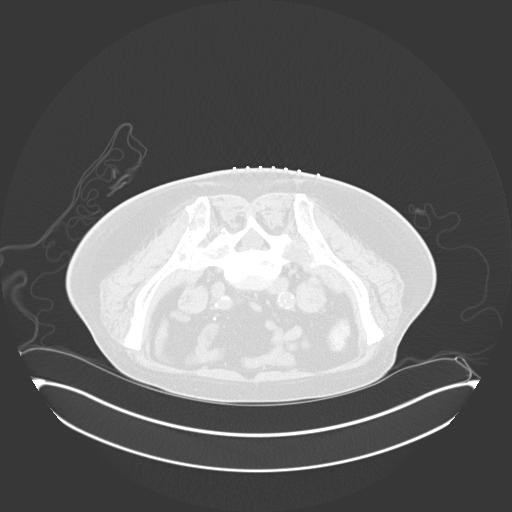
[im 18/25  lung]
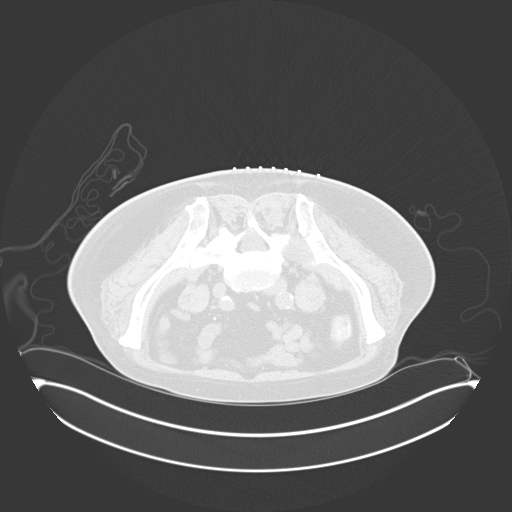
[im 19/25  lung]
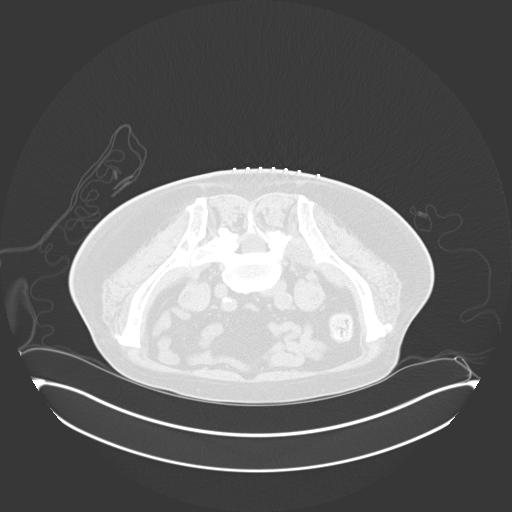
[im 22/25  mediastinal]
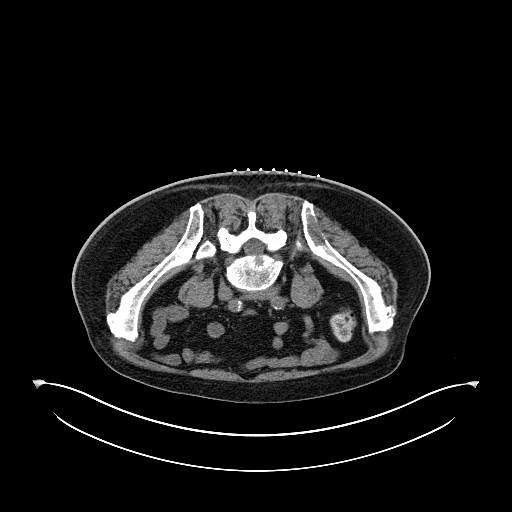
[im 22/25  lung]
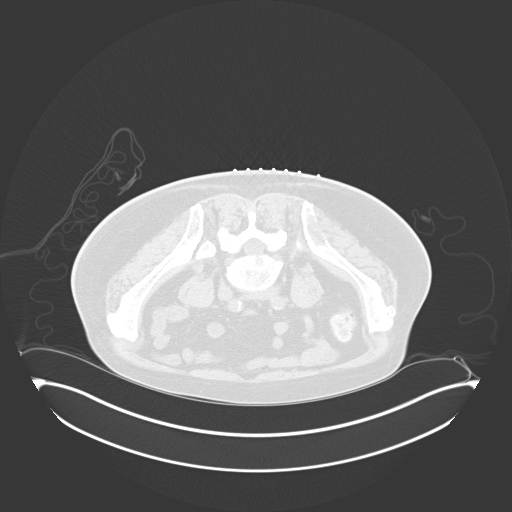
[im 23/25  lung]
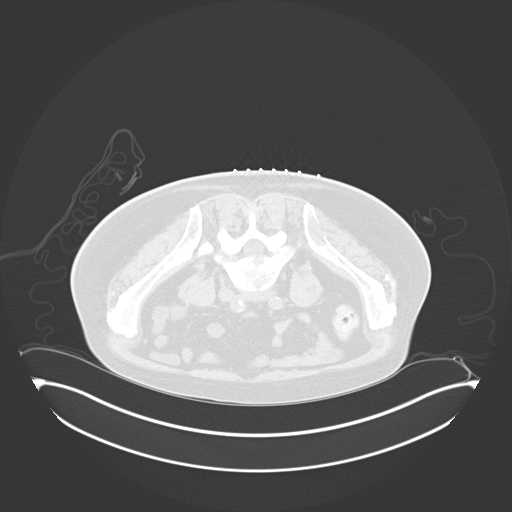

[14 of 28 positions shown; findings below may reference images not displayed]

EXAM:
CT-GUIDED BONE MARROW BIOPSY AND ASPIRATION

MEDICATIONS:
None

ANESTHESIA/SEDATION:
Fentanyl 100 mcg IV; Versed 2 mg IV

Sedation Time: 14 Minutes; The patient was continuously monitored
during the procedure by the interventional radiology nurse under my
direct supervision.

COMPLICATIONS:
None immediate.

PROCEDURE:
Informed consent was obtained from the patient following an
explanation of the procedure, risks, benefits and alternatives. The
patient understands, agrees and consents for the procedure. All
questions were addressed. A time out was performed prior to the
initiation of the procedure.

The patient was positioned prone and non-contrast localization CT
was performed of the pelvis to demonstrate the iliac marrow spaces.
The operative site was prepped and draped in the usual sterile
fashion.

Under sterile conditions and local anesthesia, a 22 gauge spinal
needle was utilized for procedural planning. Next, an 11 gauge
coaxial bone biopsy needle was advanced into the left iliac marrow
space. Needle position was confirmed with CT imaging. Initially, a
bone marrow aspiration was attempted to be performed however no
marrow was able to be aspirated. Next, a bone marrow biopsy was
obtained with the 11 gauge outer bone marrow device. The 11 gauge
coaxial bone biopsy needle was re-advanced into a slightly different
location within the left iliac marrow space, positioning was
confirmed with CT imaging and an additional bone marrow biopsy was
obtained. Samples were prepared with the cytotechnologist and deemed
adequate. The needle was removed and superficial hemostasis was
obtained with manual compression. A dressing was applied. The
patient tolerated the procedure well without immediate post
procedural complication.
IMPRESSION: Successful CT guided left iliac bone marrow core biopsy.

## 2020-10-19 MED ORDER — SODIUM CHLORIDE 0.9 % IV SOLN: INTRAVENOUS | Status: DC

## 2020-10-19 MED ORDER — NALOXONE HCL 0.4 MG/ML IJ SOLN
INTRAMUSCULAR | Status: AC
  Filled 2020-10-19: qty 1

## 2020-10-19 MED ORDER — FENTANYL CITRATE (PF) 100 MCG/2ML IJ SOLN
INTRAMUSCULAR | Status: AC
  Filled 2020-10-19: qty 2

## 2020-10-19 MED ORDER — MIDAZOLAM HCL 2 MG/2ML IJ SOLN
INTRAMUSCULAR | Status: AC
  Filled 2020-10-19: qty 4

## 2020-10-19 MED ORDER — MIDAZOLAM HCL 2 MG/2ML IJ SOLN
INTRAMUSCULAR | Status: AC | PRN
Start: 2020-10-19 — End: 2020-10-19
  Administered 2020-10-19: 2 mg via INTRAVENOUS

## 2020-10-19 MED ORDER — FLUMAZENIL 0.5 MG/5ML IV SOLN
INTRAVENOUS | Status: AC
  Filled 2020-10-19: qty 5

## 2020-10-19 MED ORDER — LIDOCAINE-EPINEPHRINE (PF) 1 %-1:200000 IJ SOLN
INTRAMUSCULAR | Status: AC | PRN
  Administered 2020-10-19: 10 mL via INTRADERMAL

## 2020-10-19 MED ORDER — FENTANYL CITRATE (PF) 100 MCG/2ML IJ SOLN
INTRAMUSCULAR | Status: AC | PRN
  Administered 2020-10-19 (×2): 50 ug via INTRAVENOUS

## 2020-10-19 NOTE — Procedures (Signed)
Pre-procedure Diagnosis: Multiple myeloma Post-procedure Diagnosis: Same  Technically successful CT guided bone marrow aspiration and biopsy of left iliac crest.   Complications: None Immediate  EBL: None  Signed: Sandi Mariscal Pager: [phone] 10/19/2020, 9:31 AM

## 2020-10-19 NOTE — Discharge Instructions (Signed)
Leave your dressing on for 24 hours, after 24 hours you may remove the dressing and shower.  The dressing does not need to be reapplied, may leave open to air.  Watch for signs and symptoms of infection: fever over 101, oozing and bleeding from biopsy site, warm to touch and swelling noted at biopsy site: if noted please call your MD who ordered the biopsy and inform them of your concerns.    Moderate Conscious Sedation, Adult, Care After These instructions provide you with information about caring for yourself after your procedure. Your health care provider may also give you more specific instructions. Your treatment has been planned according to current medical practices, but problems sometimes occur. Call your health care provider if you have any problems or questions after your procedure. What can I expect after the procedure? After your procedure, it is common:  To feel sleepy for several hours.  To feel clumsy and have poor balance for several hours.  To have poor judgment for several hours.  To vomit if you eat too soon. Follow these instructions at home: For at least 24 hours after the procedure:   Do not: ? Participate in activities where you could fall or become injured. ? Drive. ? Use heavy machinery. ? Drink alcohol. ? Take sleeping pills or medicines that cause drowsiness. ? Make important decisions or sign legal documents. ? Take care of children on your own.  Rest. Eating and drinking  Follow the diet recommended by your health care provider.  If you vomit: ? Drink water, juice, or soup when you can drink without vomiting. ? Make sure you have little or no nausea before eating solid foods. General instructions  Have a responsible adult stay with you until you are awake and alert.  Take over-the-counter and prescription medicines only as told by your health care provider.  If you smoke, do not smoke without supervision.  Keep all follow-up visits as told by  your health care provider. This is important. Contact a health care provider if:  You keep feeling nauseous or you keep vomiting.  You feel light-headed.  You develop a rash.  You have a fever. Get help right away if:  You have trouble breathing. This information is not intended to replace advice given to you by your health care provider. Make sure you discuss any questions you have with your health care provider. Document Revised: 09/29/2017 Document Reviewed: 02/06/2016 Elsevier Patient Education  2020 [place]. Bone Marrow Aspiration and Bone Marrow Biopsy, Adult, Care After This sheet gives you information about how to care for yourself after your procedure. Your health care provider may also give you more specific instructions. If you have problems or questions, contact your health care provider. What can I expect after the procedure? After the procedure, it is common to have:  Mild pain and tenderness.  Swelling.  Bruising. Follow these instructions at home: Puncture site care   Follow instructions from your health care provider about how to take care of the puncture site. Make sure you: ? Wash your hands with soap and water before and after you change your bandage (dressing). If soap and water are not available, use hand sanitizer. ? Change your dressing as told by your health care provider.  Check your puncture site every day for signs of infection. Check for: ? More redness, swelling, or pain. ? Fluid or blood. ? Warmth. ? Pus or a bad smell. Activity  Return to your normal activities as told  by your health care provider. Ask your health care provider what activities are safe for you.  Do not lift anything that is heavier than 10 lb (4.5 kg), or the limit that you are told, until your health care provider says that it is safe.  Do not drive for 24 hours if you were given a sedative during your procedure. General instructions   Take over-the-counter and  prescription medicines only as told by your health care provider.  Do not take baths, swim, or use a hot tub until your health care provider approves. Ask your health care provider if you may take showers. You may only be allowed to take sponge baths.  If directed, put ice on the affected area. To do this: ? Put ice in a plastic bag. ? Place a towel between your skin and the bag. ? Leave the ice on for 20 minutes, 2-3 times a day.  Keep all follow-up visits as told by your health care provider. This is important. Contact a health care provider if:  Your pain is not controlled with medicine.  You have a fever.  You have more redness, swelling, or pain around the puncture site.  You have fluid or blood coming from the puncture site.  Your puncture site feels warm to the touch.  You have pus or a bad smell coming from the puncture site. Summary  After the procedure, it is common to have mild pain, tenderness, swelling, and bruising.  Follow instructions from your health care provider about how to take care of the puncture site and what activities are safe for you.  Take over-the-counter and prescription medicines only as told by your health care provider.  Contact a health care provider if you have any signs of infection, such as fluid or blood coming from the puncture site. This information is not intended to replace advice given to you by your health care provider. Make sure you discuss any questions you have with your health care provider. Document Revised: 03/05/2019 Document Reviewed: 03/05/2019 Elsevier Patient Education  [place].

## 2020-10-19 NOTE — H&P (Signed)
Referring Physician(s): Ennever,Peter R  Supervising Physician: Sandi Mariscal  Patient Status:  [facility]  Chief Complaint:  "I'm having a bone marrow biopsy"  Subjective: Patient familiar to IR service from left scapula bone lesion biopsy on 07/09/2020 and bone marrow biopsy on 07/28/2020.  He has history of multiple myeloma with prior chemoradiation.  He presents again today for CT-guided bone marrow biopsy to assess treatment response.  He currently denies fever, headache, chest pain, dyspnea, cough, abdominal pain, back pain, nausea, vomiting or bleeding.  He does have some right lower extremity discomfort.  Additional history as below.  Past Medical History:  Diagnosis Date  . Atherosclerosis   . Coronary artery disease   . Goals of care, counseling/discussion 06/29/2020  . 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 Hyperlipidemia   . Hypertension   . 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 Multiple myeloma ([place]) 07/21/2020  . Myocardial infarction [facility])    Past Surgical History:  Procedure Laterality Date  . ANTERIOR CERVICAL DECOMP/DISCECTOMY FUSION N/A 06/17/2020   Procedure: ANTERIOR CERVICAL DECOMPRESSION/DISCECTOMY FUSION, INTERBODY PROSTHESIS, PLATE/SCREWS CERVICAL FIVE- CERVICAL SIX;  Surgeon: Newman Pies, MD;  Location: [place];  Service: Neurosurgery;  Laterality: N/A;  ANTERIOR CERVICAL DECOMPRESSION/DISCECTOMY FUSION, INTERBODY PROSTHESIS, PLATE/SCREWS CERVICAL FIVE- CERVICAL SIX  . CARDIAC CATHETERIZATION    . FEMUR IM NAIL Left 07/14/2020   Procedure: OPEN REDUCTION INTERNAL FIXATION (ORIF LEFT FEMORAL NAIL FRACTURE;  Surgeon: Marchia Bond, MD;  Location: [place];  Service: Orthopedics;  Laterality: Left;      Allergies: Patient has no known allergies.  Medications: Prior to Admission medications   Medication Sig Start Date End Date Taking? Authorizing Provider  acetaminophen (TYLENOL) 500 MG tablet Take 1,000 mg by mouth every 6  (six) hours.   Yes [provider]  aspirin 81 MG EC tablet Take 81 mg by mouth daily. Swallow whole.   Yes [provider]  atorvastatin (LIPITOR) 20 MG tablet Take 20 mg by mouth daily.   Yes [provider]  atorvastatin (LIPITOR) 40 MG tablet TAKE 1/2 TABLET BY MOUTH DAILY FOR CHOLESTEROL 09/09/20  Yes [provider]  Coenzyme Q10 (CO Q-10) 200 MG CAPS Take 200 mg by mouth daily.   Yes [provider]  dronabinol (MARINOL) 5 MG capsule Take 1 capsule (5 mg total) by mouth 2 (two) times daily before a meal. 08/11/20  Yes Ennever, Rudell Cobb, MD  fentaNYL (DURAGESIC) 25 MCG/HR Place 1 patch onto the skin every 3 (three) days. 10/13/20  Yes Volanda Napoleon, MD  lisinopril (ZESTRIL) 5 MG tablet Take 7.5 mg by mouth daily. 03/10/20  Yes [provider]  LORazepam (ATIVAN) 0.5 MG tablet Take 1 tablet (0.5 mg total) by mouth every 6 (six) hours as needed for anxiety (nausea). 07/21/20  Yes Volanda Napoleon, MD  metoprolol succinate (TOPROL-XL) 50 MG 24 hr tablet Take 50 mg by mouth daily. 03/10/20  Yes [provider]  omeprazole (PRILOSEC) 40 MG capsule Take by mouth. 08/06/20 08/06/21 Yes [provider]  prasugrel (EFFIENT) 10 MG TABS tablet Take 10 mg by mouth daily. 05/12/20  Yes [provider]  pregabalin (LYRICA) 150 MG capsule Take 150 mg by mouth 2 (two) times daily as needed (Nerve pain).  06/02/20  Yes [provider]  sennosides-docusate sodium (SENOKOT-S) 8.6-50 MG tablet Take 2 tablets by mouth daily. 07/14/20  Yes Merlene Pulling K, PA-C  silver sulfADIAZINE (SILVADENE) 1 % cream Apply 1 application  topically 2 (two) times daily. 10/06/20  Yes Ennever, Rudell Cobb, MD  traMADol (ULTRAM) 50 MG tablet Take 1 tablet (50 mg total) by mouth 3 (three) times daily as needed for moderate pain. 09/10/20  Yes Gery Pray, MD  baclofen (LIORESAL) 10 MG tablet Take 1 tablet (10 mg total) by mouth 3 (three) times daily. As  needed for muscle spasm 07/14/20   Merlene Pulling K, PA-C  cyclobenzaprine (FLEXERIL) 10 MG tablet Take 1 tablet (10 mg total) by mouth 3 (three) times daily as needed for muscle spasms. 06/18/20   Viona Gilmore D, NP  dexamethasone (DECADRON) 4 MG tablet Take 1 tablet (4 mg total) by mouth daily. Take for 3 days after chemotherapy treatment. Take with meals. 08/11/20   Volanda Napoleon, MD  famciclovir (FAMVIR) 500 MG tablet Take 1 tablet (500 mg total) by mouth daily. 08/11/20   Volanda Napoleon, MD  hydrOXYzine (ATARAX/VISTARIL) 10 MG tablet Take 1 tablet (10 mg total) by mouth 3 (three) times daily as needed. 08/28/20   Volanda Napoleon, MD  lenalidomide (REVLIMID) 25 MG capsule Take 1 capsule (25 mg total) by mouth daily. 14 days on, then 14 days off. [employer] Auth # [id number] 09/30/20   Volanda Napoleon, MD  morphine (MS CONTIN) 15 MG 12 hr tablet Take 1 tablet (15 mg total) by mouth every 12 (twelve) hours. 07/21/20   Volanda Napoleon, MD  morphine (MSIR) 15 MG tablet Take 1 tablet (15 mg total) by mouth every 6 (six) hours as needed for severe pain. 07/21/20   Volanda Napoleon, MD  naloxone [facility]) nasal spray 4 mg/0.1 mL Use as directed on the package. 10/14/20   Volanda Napoleon, MD  nitroGLYCERIN (NITROSTAT) 0.4 MG SL tablet Place 0.4 mg under the tongue every 5 (five) minutes as needed for chest pain.    [provider]  OLANZapine (ZYPREXA) 10 MG tablet Take 1 tablet (10 mg total) by mouth at bedtime. 08/11/20   Volanda Napoleon, MD  ondansetron (ZOFRAN) 8 MG tablet Take 1 tablet (8 mg total) by mouth every 8 (eight) hours as needed for nausea or vomiting. 07/22/20   Gery Pray, MD  Polyethylene Glycol 3350 (MIRALAX PO) Take 2 Scoops by mouth daily.    [provider]  Polyethylene Glycol 3350 (MIRALAX PO) Take by mouth as needed.    [provider]  prochlorperazine (COMPAZINE) 10 MG tablet Take 1 tablet (10 mg total) by mouth every 6 (six) hours as needed for  nausea or vomiting. 07/28/20   Gery Pray, MD  promethazine (PHENERGAN) 25 MG tablet Take 1 tablet (25 mg total) by mouth every 8 (eight) hours as needed for nausea or vomiting. 10/13/20   Volanda Napoleon, MD  rivaroxaban (XARELTO) 10 MG TABS tablet Take 1 tablet (10 mg total) by mouth daily. 07/14/20   Ventura Bruns, PA-C  triamterene-hydrochlorothiazide (MAXZIDE) 75-50 MG tablet Take 1 tablet by mouth daily. 09/15/20   Volanda Napoleon, MD     Vital Signs:pending    Physical Exam awake, alert.  Chest clear to auscultation bilaterally.  Heart with regular rate and rhythm.  Abdomen soft, positive bowel sounds, nontender.  No lower extremity edema.  Imaging: NM PET Image Initial (PI) Whole Body  Result Date: 10/16/2020 CLINICAL DATA:  Subsequent treatment strategy for multiple myeloma. EXAM: NUCLEAR MEDICINE PET WHOLE BODY TECHNIQUE: 8.8 mCi F-18 FDG was injected intravenously. Full-ring PET imaging was performed from the head to foot after  the radiotracer. CT data was obtained and used for attenuation correction and anatomic localization. Fasting blood glucose: 109 mg/dl COMPARISON:  PET-CT 07/07/2020 FINDINGS: Mediastinal blood pool activity: SUV max 2.27 HEAD/NECK: No hypermetabolic activity in the scalp. No hypermetabolic cervical lymph nodes. Incidental CT findings: none CHEST: Large area of significant radiation change involving the left lateral lung this is related to the left rib and left scapular radiation therapy. It is markedly hypermetabolic with SUV max of 8.25. The left rib lesion has near completely resolved. The left scapular lesion demonstrates significant interval healing changes with bony ingrowth and sclerotic changes. No enlarged or hypermetabolic mediastinal or hilar lymph nodes and no axillary or supraclavicular adenopathy. Improved osseous myelomatous changes involving the right shoulder including the humeral head and right scapula. Minimal residual hypermetabolism with  SUV max of 3.0 in the humeral neck area. This was previously 9.1. The SUV max in the left scapular lesion is 3.69 and was previously 15.13. Several rib lesions were hypermetabolic on the prior study and these also show significant improvement. Incidental CT findings: none ABDOMEN/PELVIS: No abnormal hypermetabolic activity within the liver, pancreas, adrenal glands, or spleen. No hypermetabolic lymph nodes in the abdomen or pelvis. Hypermetabolism noted in the anorectal region which could be due to hemorrhoids. SUV max is 5.34. No CT correlate is demonstrated. Incidental CT findings: Stable advanced vascular calcifications. SKELETON: Since the prior study the patient has had hardware placed in the left hip. The destructive myelomatous changes seen on the prior PET have improved. The bilateral sacral and iliac disease had an SUV max of 6.19 and this is now 3.30. Some sclerotic healing changes are also noted on the CT scan. The lesion in the left femoral neck had an SUV max of 4.91. This is difficult to assist now due to the hardware but appears improved. Patient had significant lumbar myelomatous disease on the prior study. SUV max was 9.71. SUV max is now 3.67 and there is significant healing changes. I do not see any new osseous lesions. Incidental CT findings: none EXTREMITIES: No myelomatous lesions are identified in the lower extremities. The upper extremity show improvement as detailed above. Incidental CT findings: none IMPRESSION: 1. Significant interval improved PET scan when compared to the prior study. Findings suggest an excellent response to treatment. No new or progressive findings. 2. 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 Electronically Signed   By: Marijo Sanes M.D.   On: 10/16/2020 14:38    Labs:  CBC: Recent Labs    09/29/20 0922 10/06/20 0944 10/13/20 1029 10/19/20 0800  WBC 5.3 4.8 6.1 6.7  HGB 11.0* 10.9* 11.4* 10.7*  HCT 32.9* 33.2* 34.4* 32.6*   PLT 108* 159 223 281    COAGS: Recent Labs    07/09/20 0725 10/19/20 0800  INR 1.1 1.1    BMP: Recent Labs    06/26/20 1638 06/29/20 1453 07/09/20 0725 07/21/20 1256 08/11/20 0822 09/22/20 0919 09/29/20 0922 10/06/20 0944 10/13/20 1029  NA 137 137 137 137   < > 135 132* 136 133*  K 4.4 4.5 3.6 4.3   < > 3.7 3.5 3.8 5.1  CL 102 99 108 102   < > 102 97* 102 102  CO2 '26 31 23 26   ' < > '26 27 24 24  ' GLUCOSE 131* 124* 116* 107*   < > 104* 134* 107* 117*  BUN '19 22 14 12   ' < > '15 22 21 16  ' CALCIUM 11.1* 12.8* 8.1*  9.0   < > 7.3* 6.3* 6.5* 8.4*  CREATININE 1.19 1.61* 0.96 1.04   < > 0.88 1.04 0.98 0.69  GFRNONAA >60 43* >60 >60   < > >60 >60 >60 >60  GFRAA >60 50* >60 >60  --   --   --   --   --    < > = values in this interval not displayed.    LIVER FUNCTION TESTS: Recent Labs    09/22/20 0919 09/29/20 0922 10/06/20 0944 10/13/20 1029  BILITOT 0.5 0.7 0.4 0.4  AST 9* 11* 9* 10*  ALT '18 17 15 21  ' ALKPHOS 200* 168* 129* 142*  PROT 5.6* 5.6* 5.8* 5.8*  ALBUMIN 3.6 3.5 3.5 3.6    Assessment and Plan: Patient familiar to IR service from left scapula bone lesion biopsy on 07/09/2020 and bone marrow biopsy on 07/28/2020.  He has history of multiple myeloma with prior chemoradiation.  He presents again today for CT-guided bone marrow biopsy to assess treatment response.Risks and benefits of procedure was discussed with the patient  including, but not limited to bleeding, infection, damage to adjacent structures or low yield requiring additional tests.  All of the questions were answered and there is agreement to proceed.  Consent signed and in chart.     Electronically Signed: D. Rowe Robert, PA-C 10/19/2020, 8:23 AM   I spent a total of 20 minutes at the the patient's bedside AND on the patient's hospital floor or unit, greater than 50% of which was counseling/coordinating care for CT-guided bone marrow biopsy

## 2020-10-20 ENCOUNTER — Inpatient Hospital Stay (HOSPITAL_BASED_OUTPATIENT_CLINIC_OR_DEPARTMENT_OTHER): Payer: BC Managed Care – PPO | Admitting: Hematology & Oncology

## 2020-10-20 ENCOUNTER — Encounter: Payer: Self-pay | Admitting: Hematology & Oncology

## 2020-10-20 ENCOUNTER — Inpatient Hospital Stay: Payer: BC Managed Care – PPO

## 2020-10-20 ENCOUNTER — Encounter: Payer: Self-pay | Admitting: *Deleted

## 2020-10-20 ENCOUNTER — Telehealth: Payer: Self-pay | Admitting: Hematology & Oncology

## 2020-10-20 VITALS — BP 145/72 | HR 73 | Temp 97.7°F | Resp 18 | Wt 177.0 lb

## 2020-10-20 DIAGNOSIS — C9000 Multiple myeloma not having achieved remission: Secondary | ICD-10-CM | POA: Diagnosis not present

## 2020-10-20 DIAGNOSIS — C419 Malignant neoplasm of bone and articular cartilage, unspecified: Secondary | ICD-10-CM | POA: Diagnosis not present

## 2020-10-20 LAB — CMP (CANCER CENTER ONLY)
ALT: 10 U/L (ref 0–44)
AST: 8 U/L — ABNORMAL LOW (ref 15–41)
Albumin: 3.6 g/dL (ref 3.5–5.0)
Alkaline Phosphatase: 137 U/L — ABNORMAL HIGH (ref 38–126)
Anion gap: 7 (ref 5–15)
BUN: 17 mg/dL (ref 8–23)
CO2: 23 mmol/L (ref 22–32)
Calcium: 8.7 mg/dL — ABNORMAL LOW (ref 8.9–10.3)
Chloride: 105 mmol/L (ref 98–111)
Creatinine: 0.68 mg/dL (ref 0.61–1.24)
GFR, Estimated: >60 mL/min (ref >60)
Glucose, Bld: 125 mg/dL — ABNORMAL HIGH (ref 70–99)
Potassium: 4.4 mmol/L (ref 3.5–5.1)
Sodium: 135 mmol/L (ref 135–145)
Total Bilirubin: 0.3 mg/dL (ref 0.3–1.2)
Total Protein: 5.9 g/dL — ABNORMAL LOW (ref 6.5–8.1)

## 2020-10-20 LAB — CBC WITH DIFFERENTIAL (CANCER CENTER ONLY)
Abs Immature Granulocytes: 0.04 10*3/uL (ref 0.00–0.07)
Basophils Absolute: 0 10*3/uL (ref 0.0–0.1)
Basophils Relative: 0 %
Eosinophils Absolute: 0.1 10*3/uL (ref 0.0–0.5)
Eosinophils Relative: 1 %
HCT: 35 % — ABNORMAL LOW (ref 39.0–52.0)
Hemoglobin: 11.3 g/dL — ABNORMAL LOW (ref 13.0–17.0)
Immature Granulocytes: 1 %
Lymphocytes Relative: 3 %
Lymphs Abs: 0.2 10*3/uL — ABNORMAL LOW (ref 0.7–4.0)
MCH: 29.4 pg (ref 26.0–34.0)
MCHC: 32.3 g/dL (ref 30.0–36.0)
MCV: 90.9 fL (ref 80.0–100.0)
Monocytes Absolute: 0.7 10*3/uL (ref 0.1–1.0)
Monocytes Relative: 9 %
Neutro Abs: 6.5 10*3/uL (ref 1.7–7.7)
Neutrophils Relative %: 86 %
Platelet Count: 331 10*3/uL (ref 150–400)
RBC: 3.85 MIL/uL — ABNORMAL LOW (ref 4.22–5.81)
RDW: 15.7 % — ABNORMAL HIGH (ref 11.5–15.5)
WBC Count: 7.5 10*3/uL (ref 4.0–10.5)
nRBC: 0 % (ref 0.0–0.2)

## 2020-10-20 LAB — LACTATE DEHYDROGENASE: LDH: 148 U/L (ref 98–192)

## 2020-10-20 MED ORDER — BORTEZOMIB CHEMO SQ INJECTION 3.5 MG (2.5MG/ML)
1.3000 mg/m2 | Freq: Once | INTRAMUSCULAR | Status: AC
  Administered 2020-10-20: 2.75 mg via SUBCUTANEOUS
  Filled 2020-10-20: qty 1.1

## 2020-10-20 MED ORDER — DARATUMUMAB-HYALURONIDASE-FIHJ 1800-30000 MG-UT/15ML ~~LOC~~ SOLN
1800.0000 mg | Freq: Once | SUBCUTANEOUS | Status: AC
  Administered 2020-10-20: 1800 mg via SUBCUTANEOUS
  Filled 2020-10-20: qty 15

## 2020-10-20 MED ORDER — FAMOTIDINE 20 MG PO TABS
40.0000 mg | ORAL_TABLET | Freq: Once | ORAL | Status: AC
  Administered 2020-10-20: 40 mg via ORAL

## 2020-10-20 MED ORDER — PREGABALIN 75 MG PO CAPS: 75.0000 mg | ORAL_CAPSULE | Freq: Two times a day (BID) | ORAL | 2 refills | Status: DC

## 2020-10-20 MED ORDER — DIPHENHYDRAMINE HCL 25 MG PO CAPS
ORAL_CAPSULE | ORAL | Status: AC
  Filled 2020-10-20: qty 2

## 2020-10-20 MED ORDER — ACETAMINOPHEN 325 MG PO TABS: 650.0000 mg | ORAL_TABLET | Freq: Once | ORAL | Status: DC

## 2020-10-20 MED ORDER — DIPHENHYDRAMINE HCL 25 MG PO CAPS
50.0000 mg | ORAL_CAPSULE | Freq: Once | ORAL | Status: AC
  Administered 2020-10-20: 50 mg via ORAL

## 2020-10-20 MED ORDER — DEXAMETHASONE 4 MG PO TABS
20.0000 mg | ORAL_TABLET | Freq: Once | ORAL | Status: AC
  Administered 2020-10-20: 20 mg via ORAL

## 2020-10-20 NOTE — Patient Instructions (Signed)
[facility] Discharge Instructions for Patients Receiving Chemotherapy  Today you received the following chemotherapy agents Velcade, Darzalex  To help prevent nausea and vomiting after your treatment, we encourage you to take your nausea medication    If you develop nausea and vomiting that is not controlled by your nausea medication, call the clinic.   BELOW ARE SYMPTOMS THAT SHOULD BE REPORTED IMMEDIATELY:  *FEVER GREATER THAN 100.5 F  *CHILLS WITH OR WITHOUT FEVER  NAUSEA AND VOMITING THAT IS NOT CONTROLLED WITH YOUR NAUSEA MEDICATION  *UNUSUAL SHORTNESS OF BREATH  *UNUSUAL BRUISING OR BLEEDING  TENDERNESS IN MOUTH AND THROAT WITH OR WITHOUT PRESENCE OF ULCERS  *URINARY PROBLEMS  *BOWEL PROBLEMS  UNUSUAL RASH Items with * indicate a potential emergency and should be followed up as soon as possible.  Feel free to call the clinic should you have any questions or concerns. The clinic phone number is [phone].  Please show the CHEMO ALERT CARD at check-in to the Emergency Department and triage nurse.    

## 2020-10-20 NOTE — Addendum Note (Signed)
Addended by: Burney Gauze R on: 10/20/2020 02:21 PM   Modules accepted: Orders

## 2020-10-20 NOTE — Telephone Encounter (Signed)
Appointments were already scheduled per 12/21 los

## 2020-10-20 NOTE — Progress Notes (Signed)
Oncology Nurse Navigator Documentation  Oncology Nurse Navigator Flowsheets 10/20/2020  Abnormal Finding Date -  Confirmed Diagnosis Date -  Diagnosis Status -  Planned Course of Treatment -  Phase of Treatment -  Chemotherapy Actual Start Date: -  Radiation Actual Start Date: -  Radiation Expected End Date: -  Radiation Actual End Date: -  Navigator Follow Up Date: 11/18/2020  Navigator Follow Up Reason: Follow-up Appointment;Chemotherapy  Navigator Location [facility]  Navigator Encounter Type Appt/Treatment Plan Review  Telephone -  Patient Visit Type MedOnc  Treatment Phase Active Tx  Barriers/Navigation Needs Coordination of Care;Education;Family Concerns  Education -  Interventions None Required  Acuity Level 2-Minimal Needs (1-2 Barriers Identified)  Referrals -  Coordination of Care -  Education Method -  Support Groups/Services Friends and Family  Time Spent with Patient 15

## 2020-10-20 NOTE — Progress Notes (Signed)
Hematology and Oncology Follow Up Visit  Bruce Smith [id number] [birth year] 69 y.o. 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 He is doing much better now. I think the IV fluids seem to help last week. We also stopped the Revlimid. I think the Revlimid it probably was a factor.  He did have a PET scan done. Thankfully, the PET scan showed that he had a fantastic response. A lot of the activity his bones improved.  He had a bone marrow test done yesterday. We will have to see what the bone marrow biopsy shows.  He had a 24-hour urine done. This showed only 9.3 mg of Kappa light chain. There is no monoclonal protein in his urine.  Have to believe that he has responded. Hopefully, we will get him to a stem cell transplant.  He has had no problems with nausea or vomiting right now. He is eating a little bit better according to his wife.  There has been no problems with cough.  The big news is that he and his wife will be great-grandparents in August. We had to make sure that he gets to that wonderful day.  Overall, I would say his performance status for now is ECOG 1.   Medications:  Current Outpatient Medications:  .  acetaminophen (TYLENOL) 500 MG tablet, Take 1,000 mg by mouth every 6 (six) hours., Disp: , Rfl:  .  aspirin 81 MG EC tablet, Take 81 mg by mouth daily. Swallow whole., Disp: , Rfl:  .  atorvastatin (LIPITOR) 20 MG tablet, Take 20 mg by mouth daily., Disp: , Rfl:  .  atorvastatin (LIPITOR) 40 MG tablet, TAKE 1/2 TABLET BY  MOUTH DAILY FOR CHOLESTEROL, Disp: , Rfl:  .  baclofen (LIORESAL) 10 MG tablet, Take 1 tablet (10 mg total) by mouth 3 (three) times daily. As needed for muscle spasm, Disp: 50 tablet, Rfl: 0 .  Coenzyme Q10 (CO Q-10) 200 MG CAPS, Take 200 mg by mouth daily., Disp: , Rfl:  .  cyclobenzaprine (FLEXERIL) 10 MG tablet, Take 1 tablet (10 mg total) by mouth 3 (three) times daily as needed for muscle spasms., Disp: 30 tablet, Rfl: 0 .  dexamethasone (DECADRON) 4 MG tablet, Take 1 tablet (4 mg total) by mouth daily. Take for 3 days after chemotherapy treatment. Take with meals., Disp: 30 tablet, Rfl: 2 .  dronabinol (MARINOL) 5 MG capsule, Take 1 capsule (5 mg total) by mouth 2 (two) times daily before a meal., Disp: 60 capsule, Rfl: 0 .  famciclovir (FAMVIR) 500 MG tablet, Take 1 tablet (500 mg total) by mouth daily., Disp: 30 tablet, Rfl: 8 .  fentaNYL (DURAGESIC) 25 MCG/HR, Place 1 patch onto the skin every 3 (three) days., Disp: 10 patch, Rfl: 0 .  hydrOXYzine (ATARAX/VISTARIL) 10 MG tablet, Take 1 tablet (10 mg total) by mouth 3 (three) times daily as needed., Disp: 30 tablet, Rfl: 0 .  lenalidomide (REVLIMID) 25 MG capsule, Take 1 capsule (25 mg total) by mouth daily. 14 days on, then 14 days  off. [employer] Auth # [id number], Disp: 14 capsule, Rfl: 0 .  lisinopril (ZESTRIL) 5 MG tablet, Take 7.5 mg by mouth daily., Disp: , Rfl:  .  LORazepam (ATIVAN) 0.5 MG tablet, Take 1 tablet (0.5 mg total) by mouth every 6 (six) hours as needed for anxiety (nausea)., Disp: 40 tablet, Rfl: 0 .  metoprolol succinate (TOPROL-XL) 50 MG 24 hr tablet, Take 50 mg by mouth daily., Disp: , Rfl:  .  morphine (MS CONTIN) 15 MG 12 hr tablet, Take 1 tablet (15 mg total) by mouth every 12 (twelve) hours., Disp: 60 tablet, Rfl: 0 .  morphine (MSIR) 15 MG tablet, Take 1 tablet (15 mg total) by mouth every 6 (six) hours as needed for severe pain., Disp: 90 tablet, Rfl: 0 .  naloxone (NARCAN) nasal spray 4 mg/0.1 mL, Use as directed on  the package., Disp: 2 each, Rfl: 1 .  nitroGLYCERIN (NITROSTAT) 0.4 MG SL tablet, Place 0.4 mg under the tongue every 5 (five) minutes as needed for chest pain., Disp: , Rfl:  .  OLANZapine (ZYPREXA) 10 MG tablet, Take 1 tablet (10 mg total) by mouth at bedtime., Disp: 30 tablet, Rfl: 2 .  omeprazole (PRILOSEC) 40 MG capsule, Take by mouth., Disp: , Rfl:  .  ondansetron (ZOFRAN) 8 MG tablet, Take 1 tablet (8 mg total) by mouth every 8 (eight) hours as needed for nausea or vomiting., Disp: 20 tablet, Rfl: 1 .  Polyethylene Glycol 3350 (MIRALAX PO), Take 2 Scoops by mouth daily., Disp: , Rfl:  .  Polyethylene Glycol 3350 (MIRALAX PO), Take by mouth as needed., Disp: , Rfl:  .  prasugrel (EFFIENT) 10 MG TABS tablet, Take 10 mg by mouth daily., Disp: , Rfl:  .  pregabalin (LYRICA) 150 MG capsule, Take 150 mg by mouth 2 (two) times daily as needed (Nerve pain). , Disp: , Rfl:  .  prochlorperazine (COMPAZINE) 10 MG tablet, Take 1 tablet (10 mg total) by mouth every 6 (six) hours as needed for nausea or vomiting., Disp: 10 tablet, Rfl: 0 .  promethazine (PHENERGAN) 25 MG tablet, Take 1 tablet (25 mg total) by mouth every 8 (eight) hours as needed for nausea or vomiting., Disp: 60 tablet, Rfl: 2 .  rivaroxaban (XARELTO) 10 MG TABS tablet, Take 1 tablet (10 mg total) by mouth daily., Disp: 30 tablet, Rfl: 0 .  sennosides-docusate sodium (SENOKOT-S) 8.6-50 MG tablet, Take 2 tablets by mouth daily., Disp: 30 tablet, Rfl: 1 .  silver sulfADIAZINE (SILVADENE) 1 % cream, Apply 1 application topically 2 (two) times daily., Disp: 50 g, Rfl: 0 .  traMADol (ULTRAM) 50 MG tablet, Take 1 tablet (50 mg total) by mouth 3 (three) times daily as needed for moderate pain., Disp: 90 tablet, Rfl: 0 .  triamterene-hydrochlorothiazide (MAXZIDE) 75-50 MG tablet, Take 1 tablet by mouth daily., Disp: 30 tablet, Rfl: 4 No current facility-administered medications for this visit.  Facility-Administered Medications Ordered in Other  Visits:  .  acetaminophen (TYLENOL) tablet 650 mg, 650 mg, Oral, Once, Graviela Nodal, Rudell Cobb, MD .  bortezomib SQ (VELCADE) chemo injection (2.93m/mL concentration) 2.75 mg, 1.3 mg/m2 (Treatment Plan Recorded), Subcutaneous, Once, Jannatul Wojdyla, PRudell Cobb MD .  daratumumab-hyaluronidase-fihj (DARZALEX FASPRO) 1800-30000 MG-UT/15ML chemo SQ injection 1,800 mg, 1,800 mg, Subcutaneous, Once, Maciah Feeback, PRudell Cobb MD .  dexamethasone (DECADRON) tablet 20 mg, 20 mg, Oral, Once, Steffie Waggoner, PRudell Cobb MD .  diphenhydrAMINE (BENADRYL) capsule 50 mg, 50 mg, Oral, Once, Kevontae Burgoon, PRudell Cobb MD .  famotidine (PEPCID) tablet 40  mg, 40 mg, Oral, Once, Shepherd Finnan, Rudell Cobb, MD  Allergies: No Known Allergies  Past Medical History, Surgical history, Social history, and Family History were reviewed and updated.  Review of Systems: Review of Systems  Constitutional: Negative.   HENT:  Negative.   Eyes: Negative.   Respiratory: Negative.   Cardiovascular: Negative.   Gastrointestinal: Negative.   Endocrine: Negative.   Genitourinary: Negative.    Musculoskeletal: Positive for arthralgias.  Skin: Negative.   Neurological: Negative.   Hematological: Negative.   Psychiatric/Behavioral: Negative.     Physical Exam:  weight is 177 lb (80.3 kg). His oral temperature is 97.7 F (36.5 C). His blood pressure is 145/72 (abnormal) and his pulse is 73. His respiration is 18 and oxygen saturation is 98%.   Wt Readings from Last 3 Encounters:  10/20/20 177 lb (80.3 kg)  10/13/20 181 lb 8 oz (82.3 kg)  10/06/20 188 lb 4 oz (85.4 kg)    Physical Exam Vitals reviewed.  HENT:     Head: Normocephalic and atraumatic.  Eyes:     Pupils: Pupils are equal, round, and reactive to light.  Cardiovascular:     Rate and Rhythm: Normal rate and regular rhythm.     Heart sounds: Normal heart sounds.  Pulmonary:     Effort: Pulmonary effort is normal.     Breath sounds: Normal breath sounds.  Abdominal:     General: Bowel sounds are normal.      Palpations: Abdomen is soft.  Musculoskeletal:        General: No tenderness or deformity. Normal range of motion.     Cervical back: Normal range of motion.     Comments: He has the left hip surgical scar that is healed.  He has some decreased range of motion of the left hip area.  He has some discomfort in the left shoulder blade.  He does seem to have little bit of better range of motion in this area.  Lymphadenopathy:     Cervical: No cervical adenopathy.  Skin:    General: Skin is warm and dry.     Findings: No erythema or rash.  Neurological:     Mental Status: He is alert and oriented to person, place, and time.  Psychiatric:        Behavior: Behavior normal.        Thought Content: Thought content normal.        Judgment: Judgment normal.    Lab Results  Component Value Date   WBC 7.5 10/20/2020   HGB 11.3 (L) 10/20/2020   HCT 35.0 (L) 10/20/2020   MCV 90.9 10/20/2020   PLT 331 10/20/2020     Chemistry      Component Value Date/Time   NA 135 10/20/2020 0915   K 4.4 10/20/2020 0915   CL 105 10/20/2020 0915   CO2 23 10/20/2020 0915   BUN 17 10/20/2020 0915   CREATININE 0.68 10/20/2020 0915      Component Value Date/Time   CALCIUM 8.7 (L) 10/20/2020 0915   ALKPHOS 137 (H) 10/20/2020 0915   AST 8 (L) 10/20/2020 0915   ALT 10 10/20/2020 0915   BILITOT 0.3 10/20/2020 0915      Impression and Plan: Bruce Smith is a very nice 69 year old white male.  He has kappa light chain myeloma.  I am not to surprised by this as light chain myeloma does tend to involve the bones.  Again, we will have to see what his bone marrow biopsy  shows. I really have to believe that he is responding. If so, then we will plan for referral out to [facility] for a stem cell transplant.  He will go with his Velcade today.  I am just happy that he is doing better. I know he will enjoy Christmas.  We will plan to get him back to see [place] in a week or so.  Volanda Napoleon, MD 12/21/202111:12  AM

## 2020-10-20 NOTE — Progress Notes (Signed)
Pt discharged in no apparent distress. Pt left ambulatory with walker. Pt aware of discharge instructions and verbalized understanding and had no further questions.dph

## 2020-10-21 LAB — SURGICAL PATHOLOGY

## 2020-10-21 LAB — IGG, IGA, IGM
IgA: 59 mg/dL — ABNORMAL LOW (ref 61–437)
IgG (Immunoglobin G), Serum: 336 mg/dL — ABNORMAL LOW (ref 603–1613)
IgM (Immunoglobulin M), Srm: <5 mg/dL — ABNORMAL LOW (ref 20–172)

## 2020-10-21 LAB — KAPPA/LAMBDA LIGHT CHAINS
Kappa free light chain: 5.2 mg/L (ref 3.3–19.4)
Kappa, lambda light chain ratio: 2.17 — ABNORMAL HIGH (ref 0.26–1.65)
Lambda free light chains: 2.4 mg/L — ABNORMAL LOW (ref 5.7–26.3)

## 2020-10-22 ENCOUNTER — Encounter: Payer: Self-pay | Admitting: *Deleted

## 2020-10-22 NOTE — Progress Notes (Signed)
Oncology Nurse Navigator Documentation  Oncology Nurse Navigator Flowsheets 10/22/2020  Abnormal Finding Date -  Confirmed Diagnosis Date -  Diagnosis Status -  Planned Course of Treatment -  Phase of Treatment -  Chemotherapy Actual Start Date: -  Radiation Actual Start Date: -  Radiation Expected End Date: -  Radiation Actual End Date: -  Navigator Follow Up Date: 11/18/2020  Navigator Follow Up Reason: Follow-up Appointment;Chemotherapy  Navigator Location [facility]  Navigator Encounter Type Pathology Review  Telephone -  Patient Visit Type MedOnc  Treatment Phase Active Tx  Barriers/Navigation Needs Coordination of Care;Education;Family Concerns  Education -  Interventions None Required  Acuity Level 2-Minimal Needs (1-2 Barriers Identified)  Referrals -  Coordination of Care -  Education Method -  Support Groups/Services Friends and Family  Time Spent with Patient 15

## 2020-10-26 ENCOUNTER — Emergency Department (HOSPITAL_COMMUNITY): Payer: BC Managed Care – PPO

## 2020-10-26 ENCOUNTER — Observation Stay (HOSPITAL_COMMUNITY): Payer: BC Managed Care – PPO

## 2020-10-26 ENCOUNTER — Encounter (HOSPITAL_COMMUNITY): Payer: Self-pay | Admitting: Hematology & Oncology

## 2020-10-26 ENCOUNTER — Telehealth: Payer: Self-pay | Admitting: *Deleted

## 2020-10-26 ENCOUNTER — Other Ambulatory Visit: Payer: Self-pay

## 2020-10-26 ENCOUNTER — Encounter (HOSPITAL_COMMUNITY): Payer: Self-pay

## 2020-10-26 ENCOUNTER — Observation Stay (HOSPITAL_BASED_OUTPATIENT_CLINIC_OR_DEPARTMENT_OTHER)
Admission: EM | Admit: 2020-10-26 | Discharge: 2020-10-27 | Disposition: A | Discharge status: Home / Self Care | Payer: BC Managed Care – PPO | Source: Home / Self Care | Attending: Emergency Medicine | Admitting: Emergency Medicine

## 2020-10-26 DIAGNOSIS — R52 Pain, unspecified: Secondary | ICD-10-CM | POA: Diagnosis not present

## 2020-10-26 DIAGNOSIS — M79605 Pain in left leg: Secondary | ICD-10-CM | POA: Insufficient documentation

## 2020-10-26 DIAGNOSIS — M79606 Pain in leg, unspecified: Secondary | ICD-10-CM

## 2020-10-26 DIAGNOSIS — C9000 Multiple myeloma not having achieved remission: Secondary | ICD-10-CM

## 2020-10-26 DIAGNOSIS — Z20822 Contact with and (suspected) exposure to covid-19: Secondary | ICD-10-CM | POA: Insufficient documentation

## 2020-10-26 DIAGNOSIS — N179 Acute kidney failure, unspecified: Secondary | ICD-10-CM | POA: Diagnosis not present

## 2020-10-26 DIAGNOSIS — Z87891 Personal history of nicotine dependence: Secondary | ICD-10-CM | POA: Insufficient documentation

## 2020-10-26 DIAGNOSIS — Z79899 Other long term (current) drug therapy: Secondary | ICD-10-CM | POA: Insufficient documentation

## 2020-10-26 DIAGNOSIS — Z7982 Long term (current) use of aspirin: Secondary | ICD-10-CM | POA: Insufficient documentation

## 2020-10-26 DIAGNOSIS — M79604 Pain in right leg: Secondary | ICD-10-CM | POA: Insufficient documentation

## 2020-10-26 DIAGNOSIS — R531 Weakness: Secondary | ICD-10-CM

## 2020-10-26 DIAGNOSIS — Z9861 Coronary angioplasty status: Secondary | ICD-10-CM | POA: Insufficient documentation

## 2020-10-26 DIAGNOSIS — W1839XA Other fall on same level, initial encounter: Secondary | ICD-10-CM | POA: Insufficient documentation

## 2020-10-26 DIAGNOSIS — R296 Repeated falls: Secondary | ICD-10-CM | POA: Insufficient documentation

## 2020-10-26 DIAGNOSIS — I10 Essential (primary) hypertension: Secondary | ICD-10-CM | POA: Insufficient documentation

## 2020-10-26 DIAGNOSIS — Z7901 Long term (current) use of anticoagulants: Secondary | ICD-10-CM | POA: Insufficient documentation

## 2020-10-26 DIAGNOSIS — I2510 Atherosclerotic heart disease of native coronary artery without angina pectoris: Secondary | ICD-10-CM | POA: Insufficient documentation

## 2020-10-26 LAB — URINALYSIS, ROUTINE W REFLEX MICROSCOPIC
Bilirubin Urine: NEGATIVE
Glucose, UA: NEGATIVE mg/dL
Hgb urine dipstick: NEGATIVE
Ketones, ur: 5 mg/dL — AB
Leukocytes,Ua: NEGATIVE
Nitrite: NEGATIVE
Protein, ur: NEGATIVE mg/dL
Specific Gravity, Urine: 1.017 (ref 1.005–1.030)
pH: 5 (ref 5.0–8.0)

## 2020-10-26 LAB — BASIC METABOLIC PANEL
Anion gap: 11 (ref 5–15)
BUN: 23 mg/dL (ref 8–23)
CO2: 23 mmol/L (ref 22–32)
Calcium: 8.6 mg/dL — ABNORMAL LOW (ref 8.9–10.3)
Chloride: 100 mmol/L (ref 98–111)
Creatinine, Ser: 0.69 mg/dL (ref 0.61–1.24)
GFR, Estimated: >60 mL/min (ref >60)
Glucose, Bld: 125 mg/dL — ABNORMAL HIGH (ref 70–99)
Potassium: 4.3 mmol/L (ref 3.5–5.1)
Sodium: 134 mmol/L — ABNORMAL LOW (ref 135–145)

## 2020-10-26 LAB — IMMUNOFIXATION REFLEX, SERUM
IgA: 65 mg/dL (ref 61–437)
IgG (Immunoglobin G), Serum: 348 mg/dL — ABNORMAL LOW (ref 603–1613)
IgM (Immunoglobulin M), Srm: 6 mg/dL — ABNORMAL LOW (ref 20–172)

## 2020-10-26 LAB — PROTEIN ELECTROPHORESIS, SERUM, WITH REFLEX
A/G Ratio: 1.1 (ref 0.7–1.7)
Albumin ELP: 2.8 g/dL — ABNORMAL LOW (ref 2.9–4.4)
Alpha-1-Globulin: 0.3 g/dL (ref 0.0–0.4)
Alpha-2-Globulin: 1.1 g/dL — ABNORMAL HIGH (ref 0.4–1.0)
Beta Globulin: 0.7 g/dL (ref 0.7–1.3)
Gamma Globulin: 0.3 g/dL — ABNORMAL LOW (ref 0.4–1.8)
Globulin, Total: 2.5 g/dL (ref 2.2–3.9)
M-Spike, %: 0.1 g/dL — ABNORMAL HIGH
SPEP Interpretation: 0
Total Protein ELP: 5.3 g/dL — ABNORMAL LOW (ref 6.0–8.5)

## 2020-10-26 LAB — CBC WITH DIFFERENTIAL/PLATELET
Abs Immature Granulocytes: 0.09 10*3/uL — ABNORMAL HIGH (ref 0.00–0.07)
Basophils Absolute: 0 10*3/uL (ref 0.0–0.1)
Basophils Relative: 0 %
Eosinophils Absolute: 0 10*3/uL (ref 0.0–0.5)
Eosinophils Relative: 0 %
HCT: 36.7 % — ABNORMAL LOW (ref 39.0–52.0)
Hemoglobin: 12.2 g/dL — ABNORMAL LOW (ref 13.0–17.0)
Immature Granulocytes: 1 %
Lymphocytes Relative: 3 %
Lymphs Abs: 0.3 10*3/uL — ABNORMAL LOW (ref 0.7–4.0)
MCH: 30 pg (ref 26.0–34.0)
MCHC: 33.2 g/dL (ref 30.0–36.0)
MCV: 90.2 fL (ref 80.0–100.0)
Monocytes Absolute: 1 10*3/uL (ref 0.1–1.0)
Monocytes Relative: 12 %
Neutro Abs: 7.6 10*3/uL (ref 1.7–7.7)
Neutrophils Relative %: 84 %
Platelets: 214 10*3/uL (ref 150–400)
RBC: 4.07 MIL/uL — ABNORMAL LOW (ref 4.22–5.81)
RDW: 16 % — ABNORMAL HIGH (ref 11.5–15.5)
WBC: 9.1 10*3/uL (ref 4.0–10.5)
nRBC: 0 % (ref 0.0–0.2)

## 2020-10-26 LAB — SARS CORONAVIRUS 2 (TAT 6-24 HRS): SARS Coronavirus 2: NEGATIVE

## 2020-10-26 LAB — CK: Total CK: 27 U/L — ABNORMAL LOW (ref 49–397)

## 2020-10-26 IMAGING — CR DG ELBOW 2V*R*
2 series · 2 of 2 positions shown · non-contrast
Comparison: None.

CLINICAL DATA: Pain following fall

EXAM:
RIGHT ELBOW - 2 VIEW

[x elbow ap right]
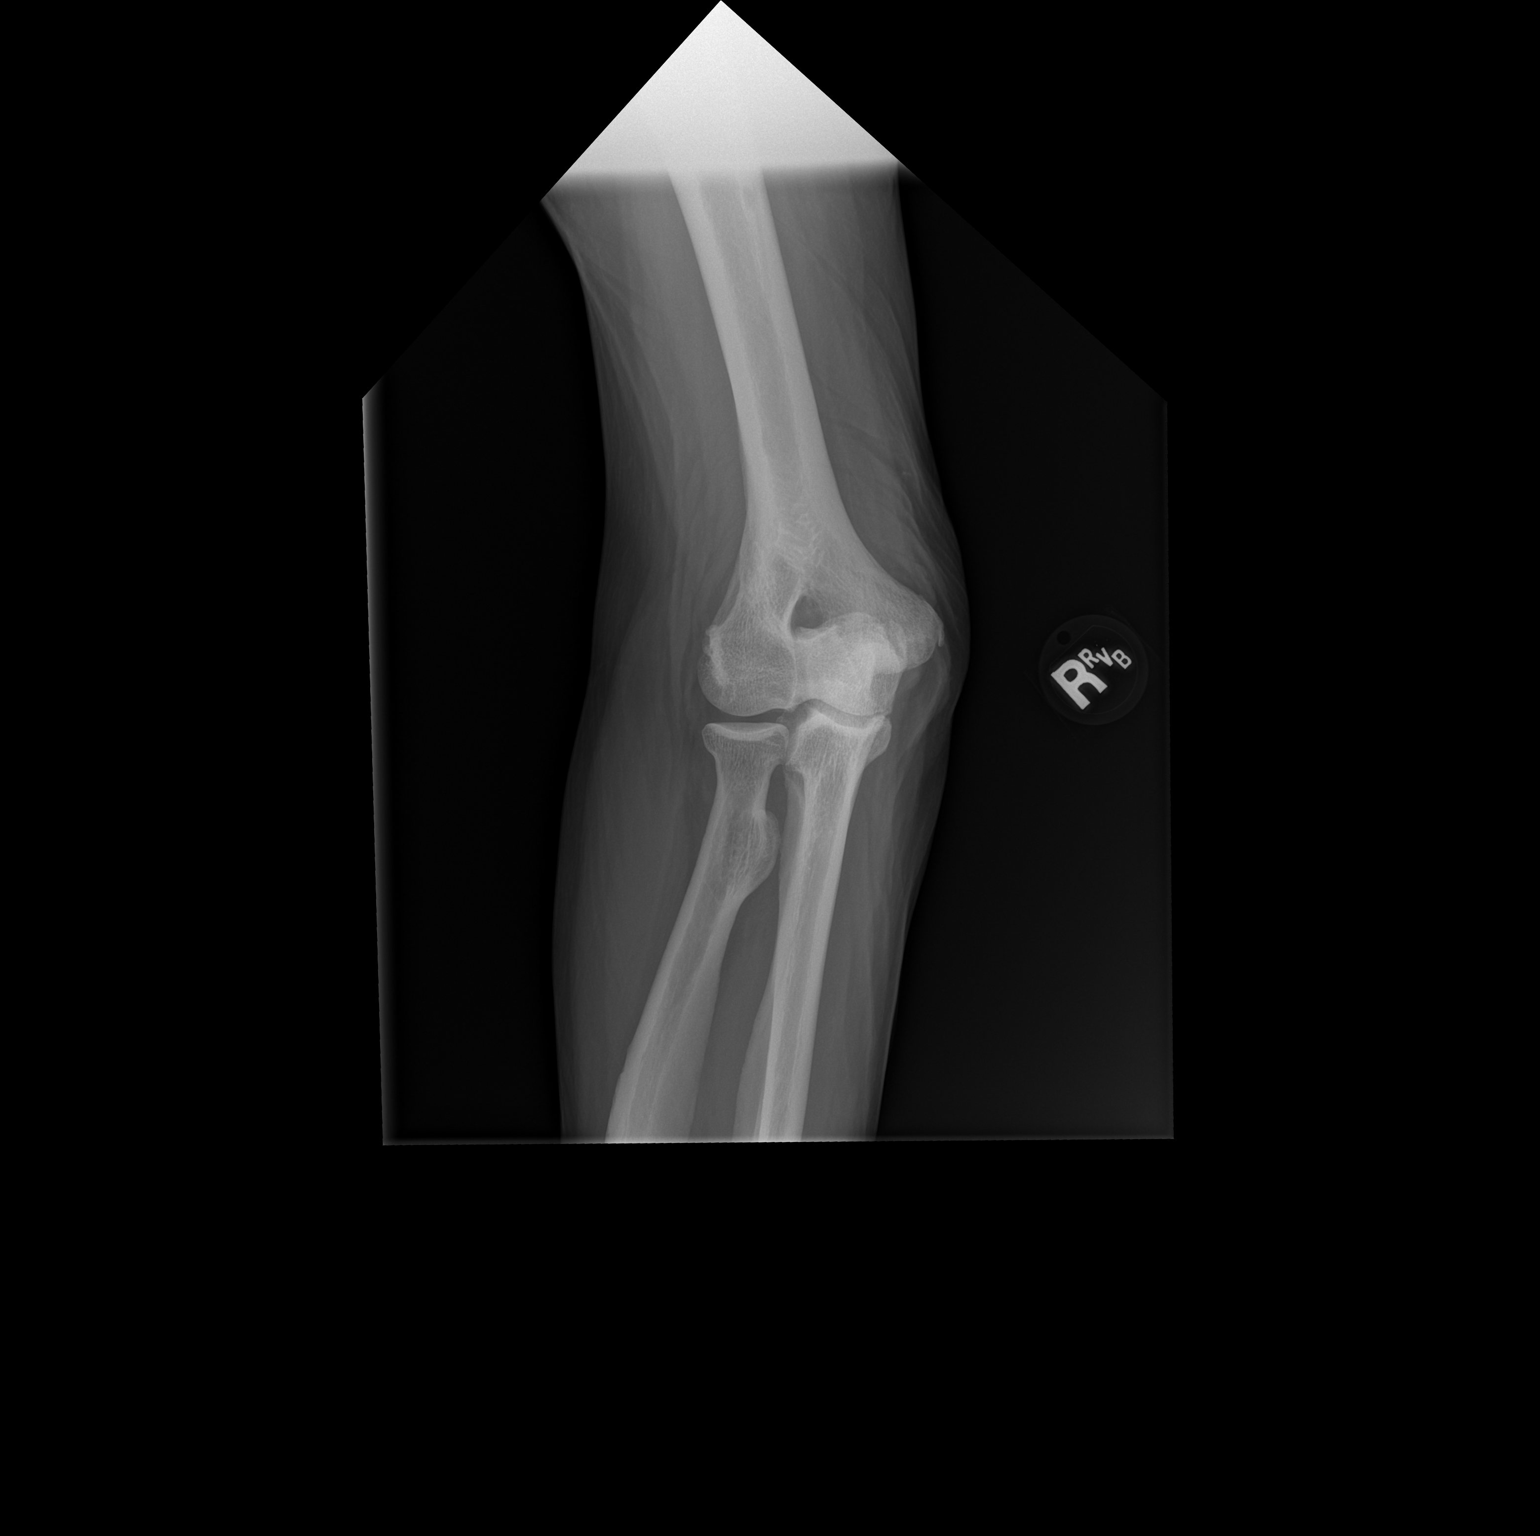

[x elbow lat right]
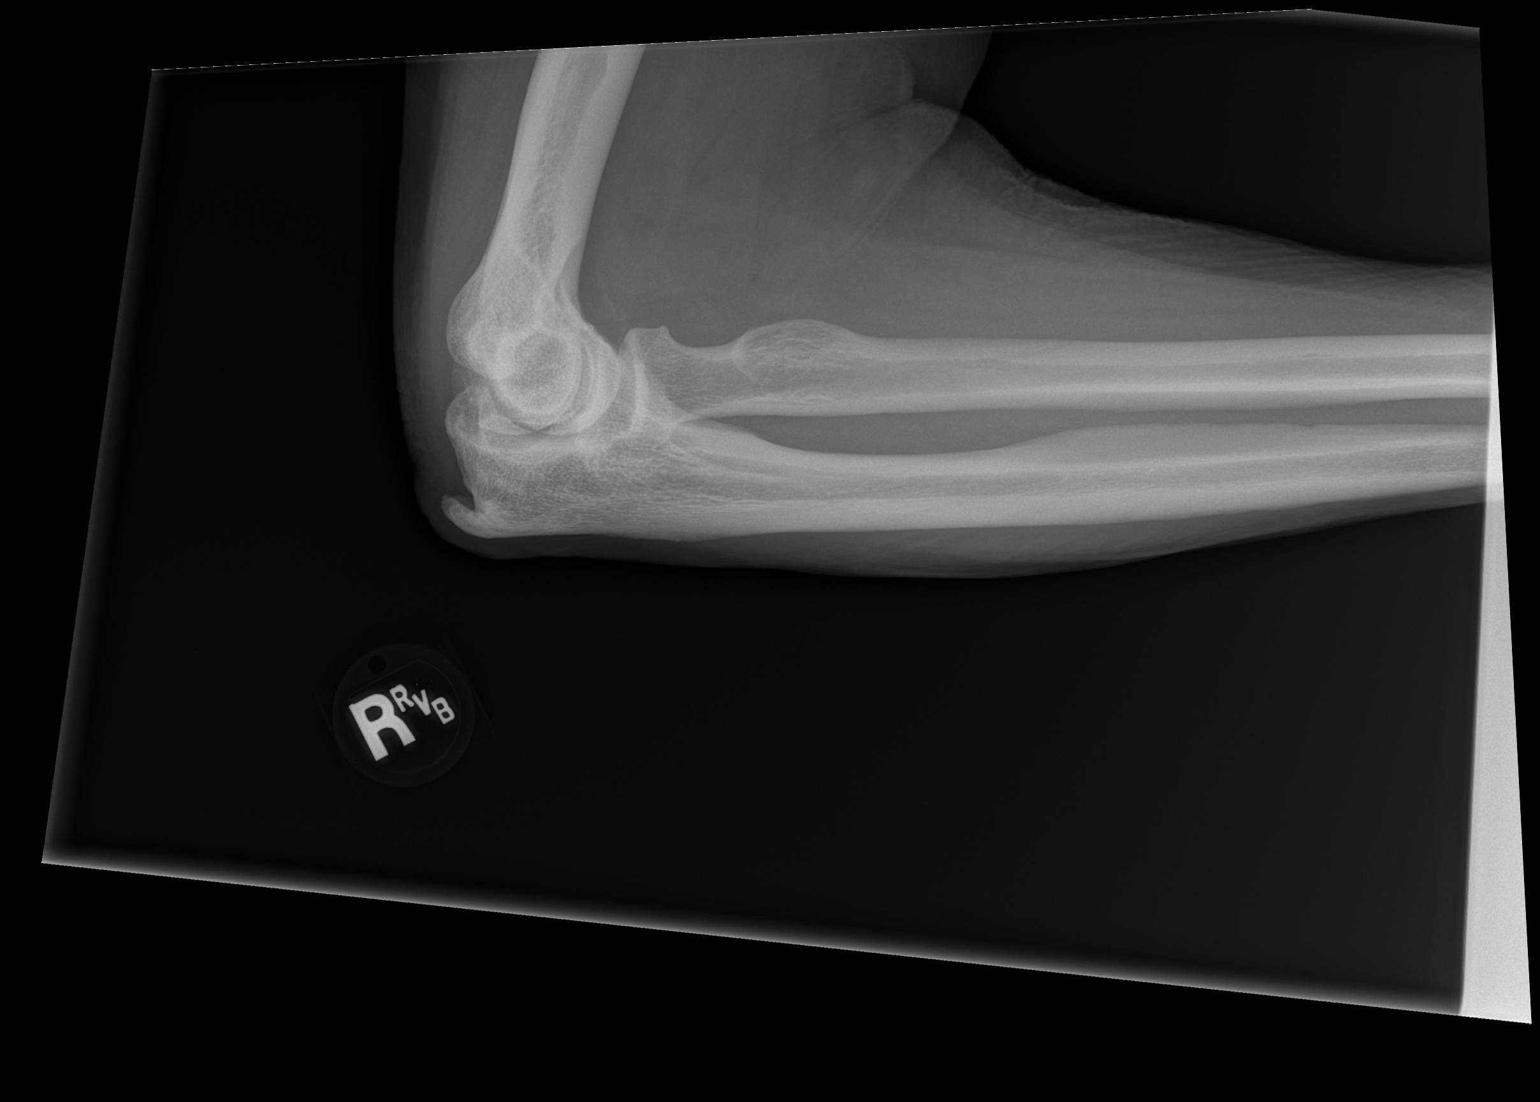

[2 of 2 positions shown; findings below may reference images not displayed]

FINDINGS: Frontal and lateral views were obtained. No fracture or dislocation.
No joint effusion. No appreciable joint space narrowing. Prominent
olecranon process spur noted. Small spur along the medial distal
humeral condyle.
IMPRESSION: No fracture or dislocation. No appreciable joint space narrowing.
Prominent olecranon process spur. Small spur along the medial distal
humeral condyle likely represents focal calcific tendinosis.

## 2020-10-26 IMAGING — CR DG HIP (WITH OR WITHOUT PELVIS) 3-4V BILAT
6 series · 6 of 6 positions shown · non-contrast
Comparison: None.

CLINICAL DATA: Pain following fall

EXAM:
DG HIP (WITH OR WITHOUT PELVIS) 3-4V BILAT

[t pelvis ap (1 of 2)]
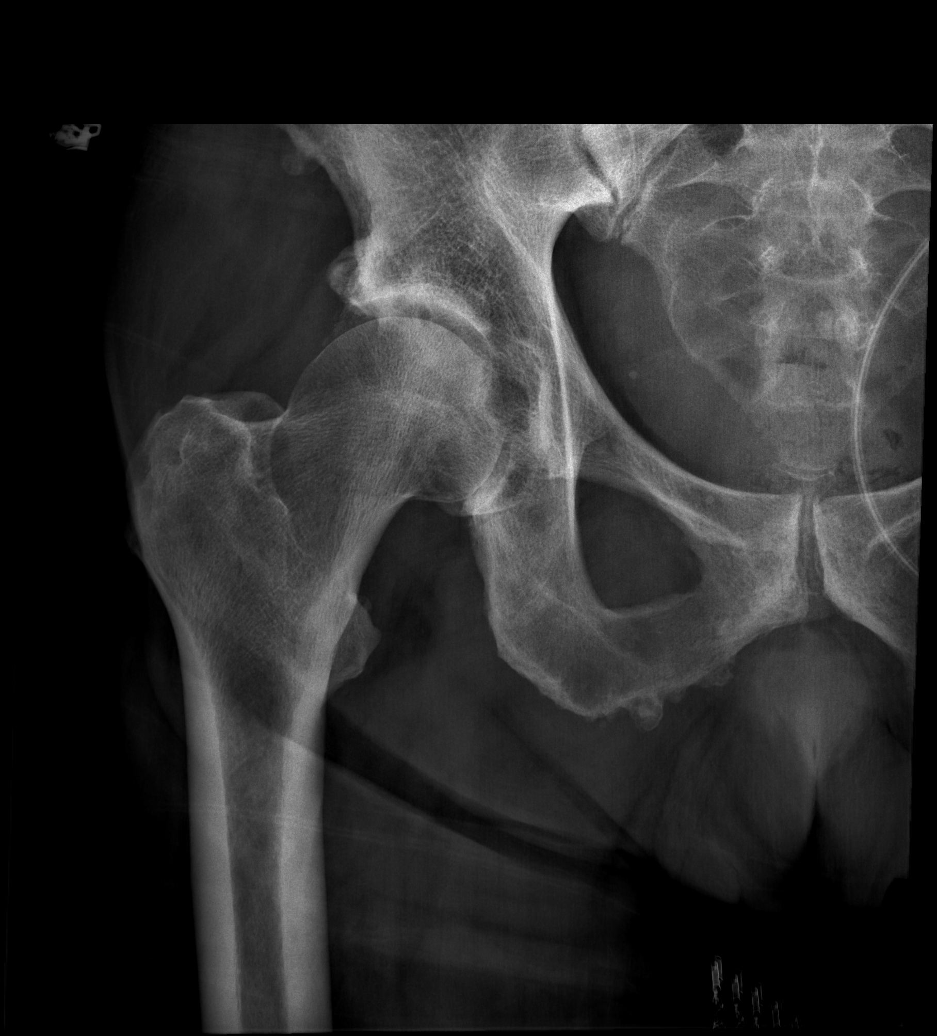

[t pelvis ap (2 of 2)]
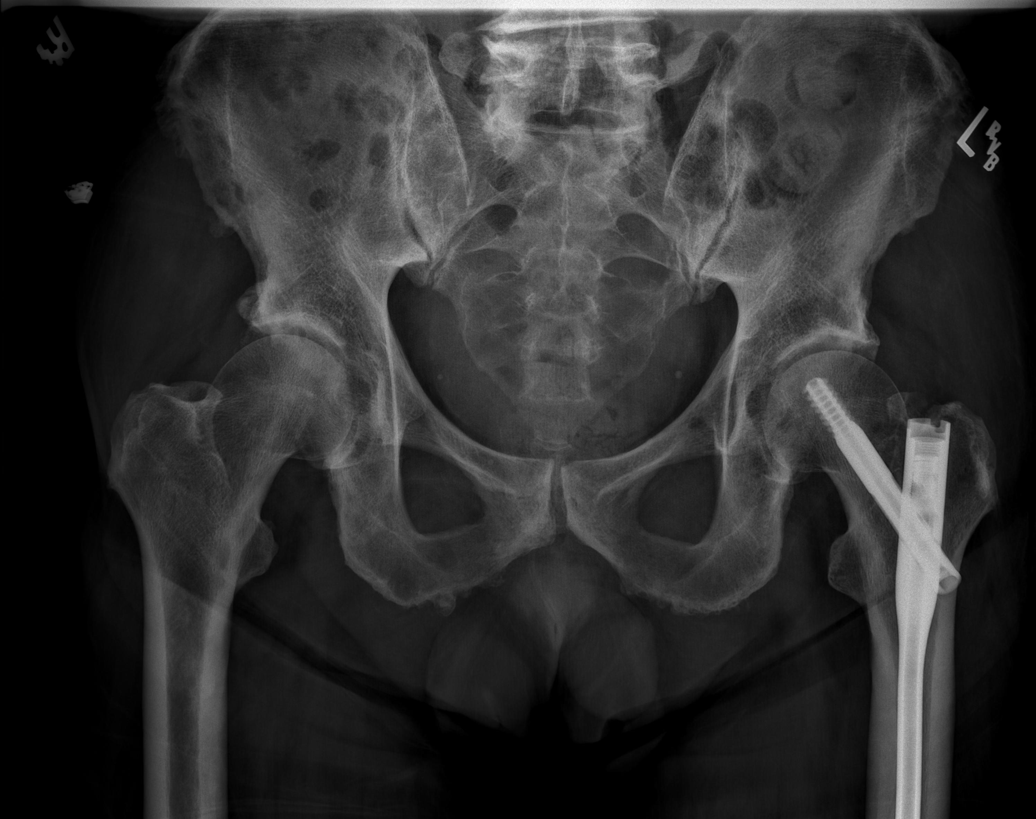

[t hip ap left]
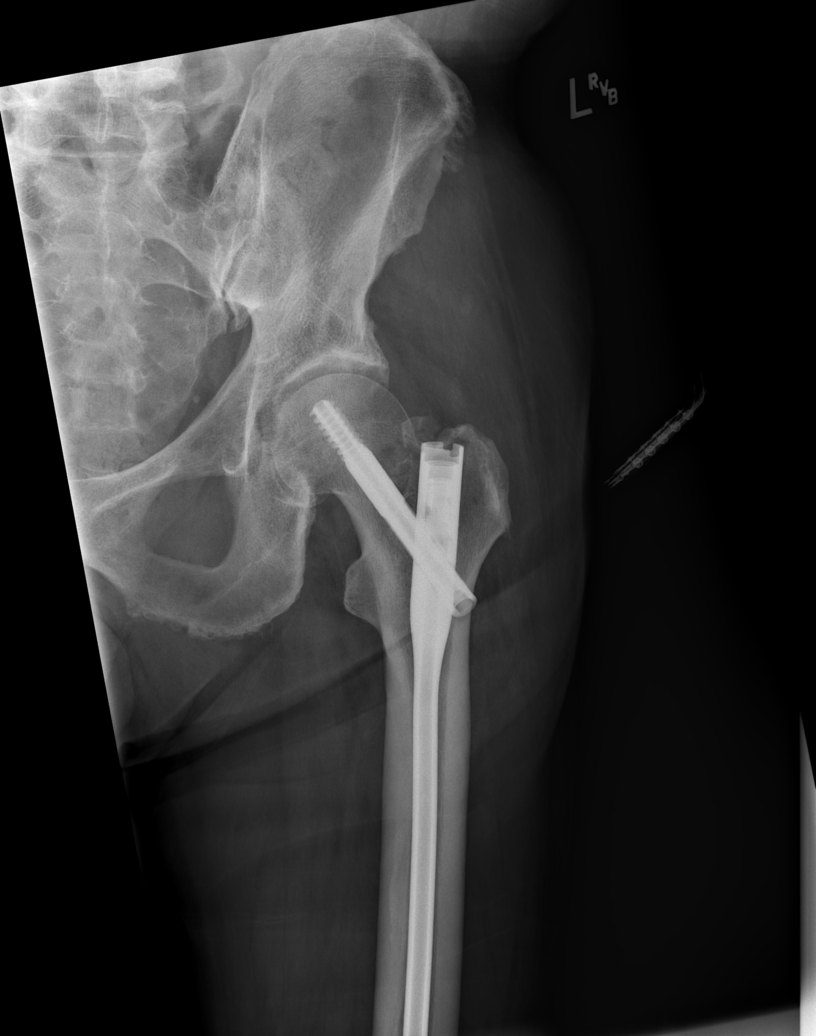

[t hip ap right]
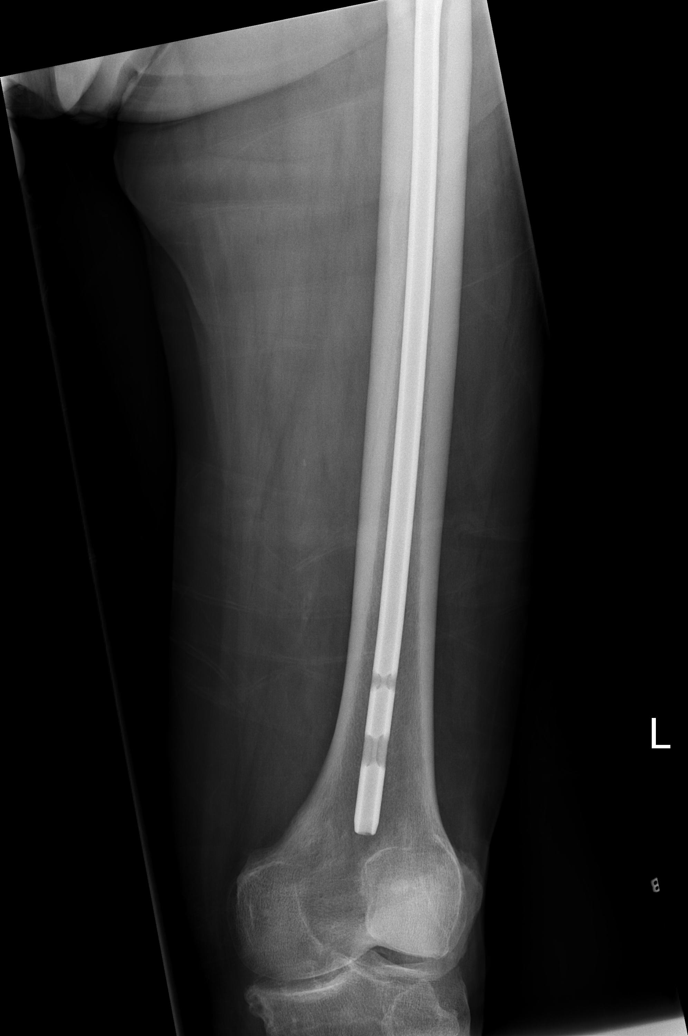

[t hip frog leg left]
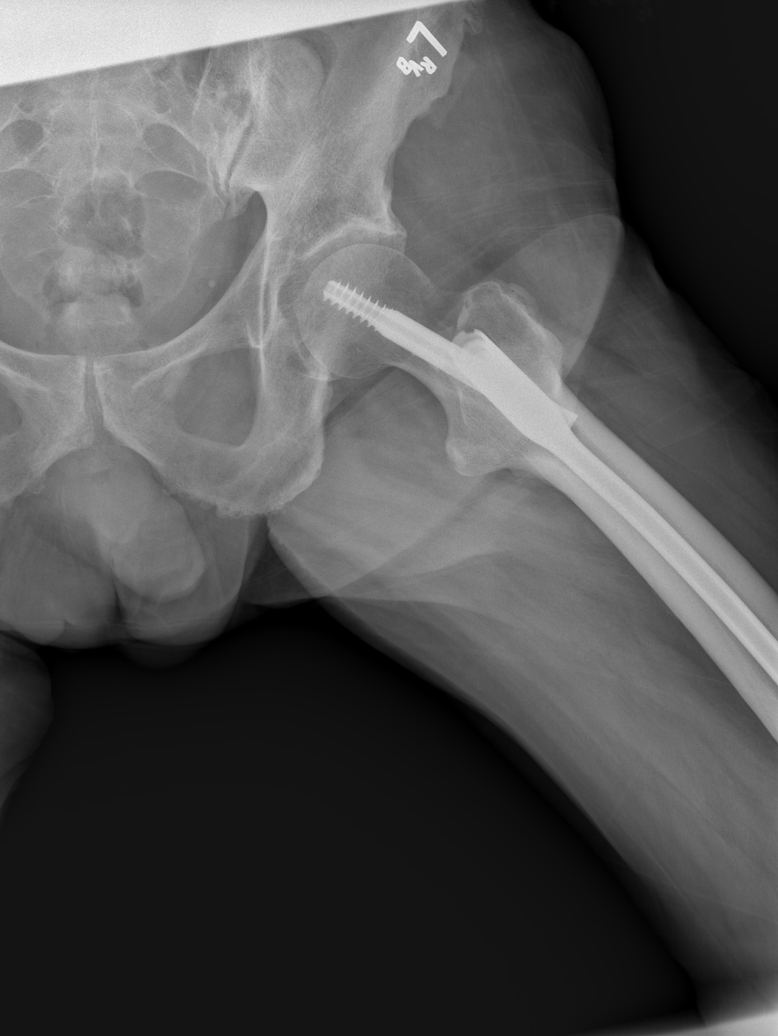

[t hip frog leg right]
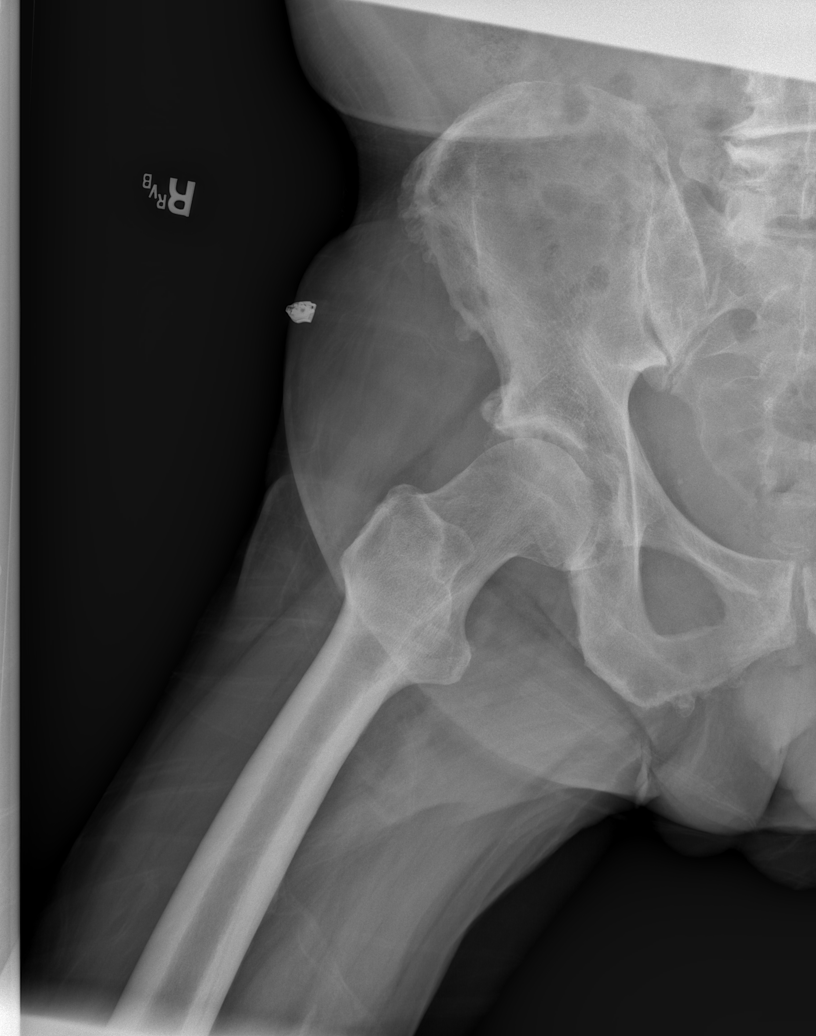

[6 of 6 positions shown; findings below may reference images not displayed]

FINDINGS: Frontal pelvis as well as frontal and lateral views of each hip
joint-total 5-views obtained. There is postoperative screw and nail
fixation in the left femur with alignment anatomic. Tip of screw in
proximal left femur. No acute fracture or dislocation. There is mild
symmetric narrowing of each hip joint. There is bony overgrowth
along the superolateral right acetabulum. Sacroiliac joints appear
unremarkable.
IMPRESSION: Postoperative changes in the left femur. Slight narrowing of each
hip joint. Bony overgrowth along the superolateral right acetabulum
likely of arthropathic etiology. No acute fracture or dislocation.

## 2020-10-26 IMAGING — MR MR PELVIS WO/W CM
8 series · 48 of 48 positions shown · IV contrast (gadavist)
Comparison: MRI lumbar spine [DATE] and head CT on
[DATE]

CLINICAL DATA: History of multiple myeloma, patient had a recent
fall

EXAM:
MRI PELVIS WITHOUT AND WITH CONTRAST
TECHNIQUE: Multiplanar multisequence MR imaging of the pelvis was performed
both before and after administration of intravenous contrast.
CONTRAST:  8mL GADAVIST GADOBUTROL 1 MMOL/ML IV SOLN

[Series 13: T1 · coronal · 5.0mm · 1.19mm/px · 5 of 30 slices shown (1 of 3)]
[im 1/30]
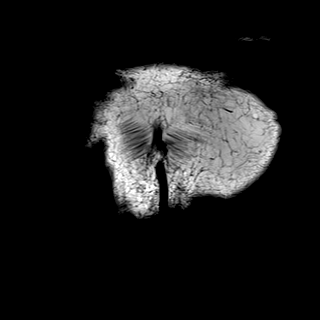
[im 8/30]
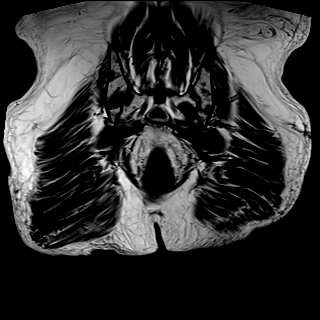
[im 15/30]
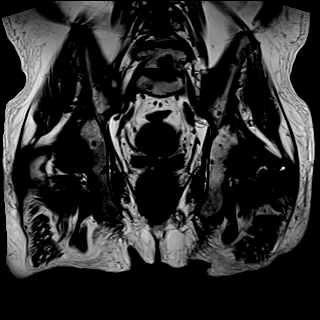
[im 22/30]
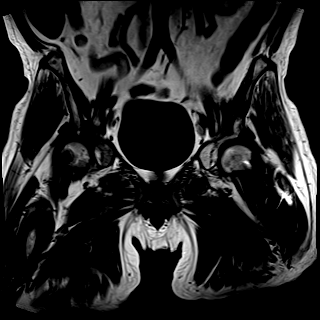
[im 30/30]
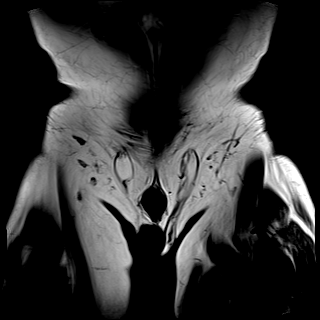

[Series 14: STIR · coronal · 5.0mm · 1.19mm/px · 4 of 30 slices shown (1 of 2)]
[im 1/30]
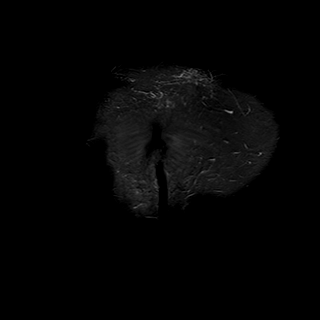
[im 10/30]
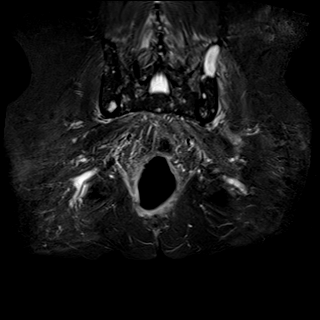
[im 20/30]
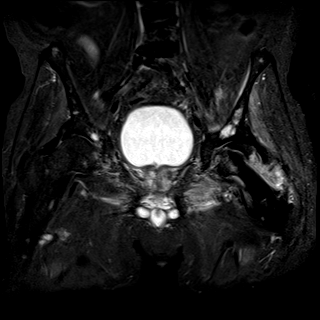
[im 30/30]
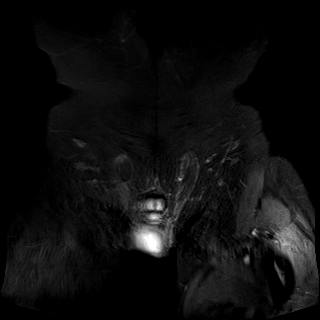

[Series 15: T1 · axial · 4.0mm · 1.12mm/px · z∈[-166,+128]mm · 7 of 60 slices shown (2 of 3)]
[im 1/60]
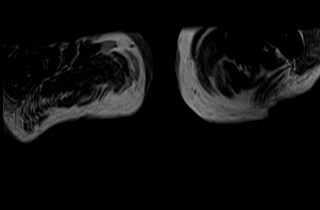
[im 10/60]
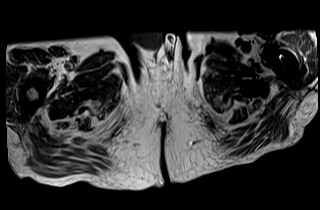
[im 20/60]
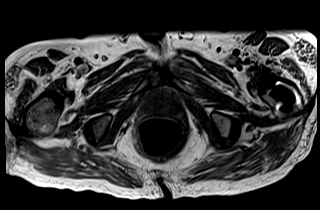
[im 30/60]
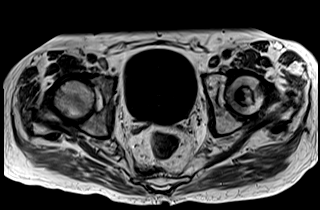
[im 40/60]
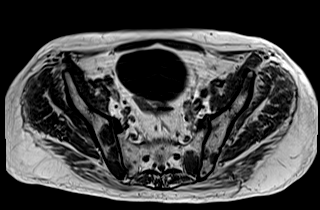
[im 50/60]
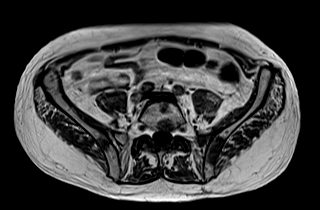
[im 60/60]
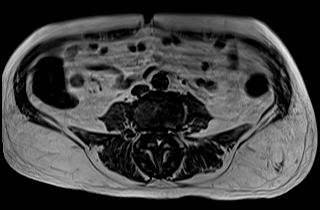

[Series 16: T2 fat-sat · axial · 4.0mm · 1.12mm/px · z∈[-166,+128]mm · 7 of 60 slices shown]
[im 1/60]
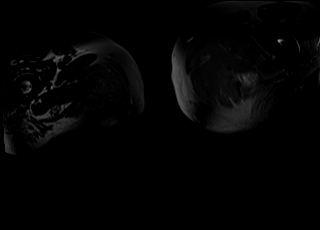
[im 10/60]
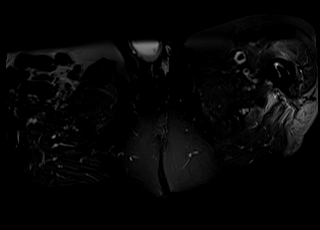
[im 20/60]
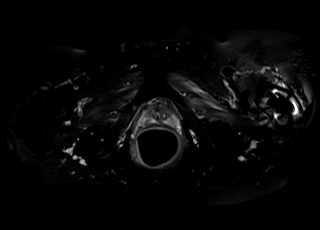
[im 30/60]
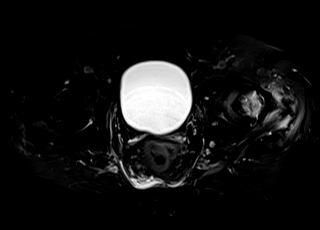
[im 40/60]
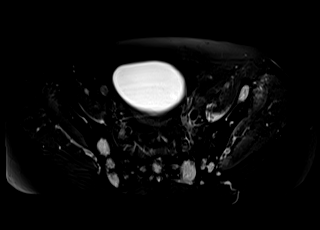
[im 50/60]
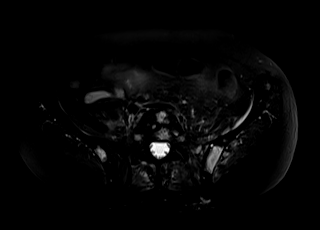
[im 60/60]
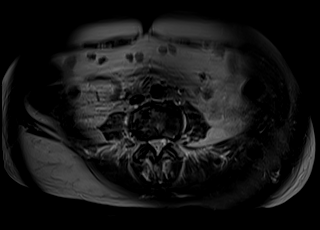

[Series 17: STIR · sagittal · 5.0mm · 1.12mm/px · 7 of 60 slices shown (2 of 2)]
[im 1/60]
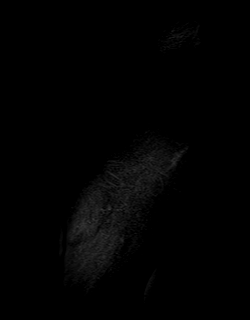
[im 10/60]
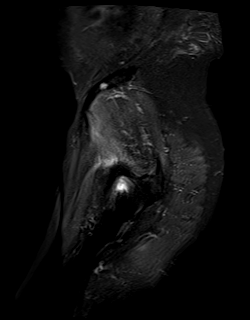
[im 20/60]
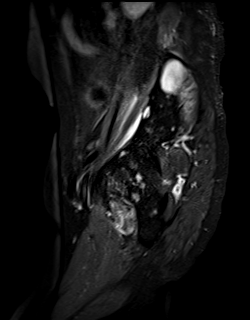
[im 30/60]
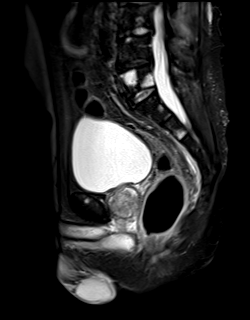
[im 40/60]
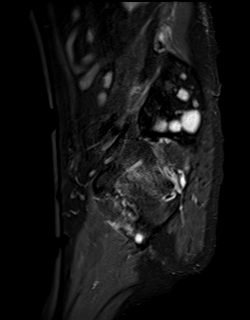
[im 50/60]
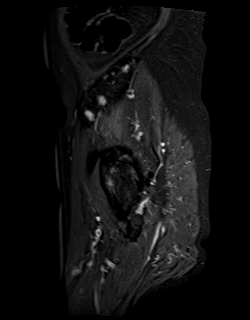
[im 60/60]
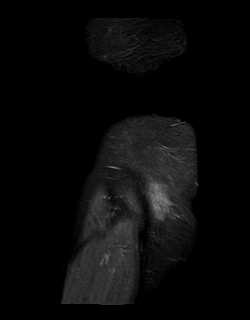

[Series 18: T1 · axial · non-contrast · 4.0mm · 1.16mm/px · z∈[-166,+129]mm · 7 of 60 slices shown (3 of 3)]
[im 1/60]
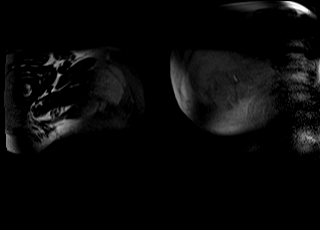
[im 10/60]
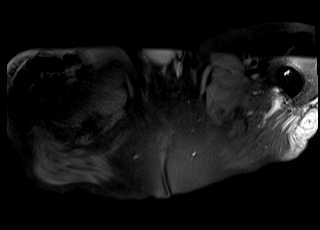
[im 20/60]
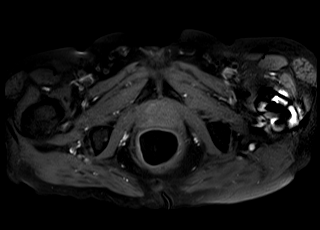
[im 30/60]
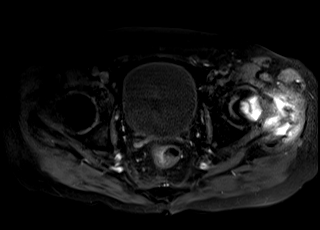
[im 40/60]
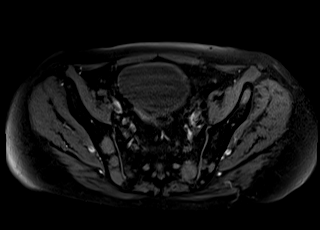
[im 50/60]
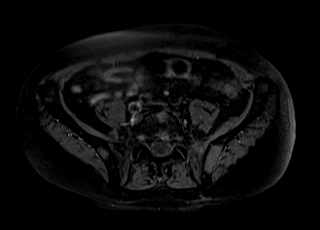
[im 60/60]
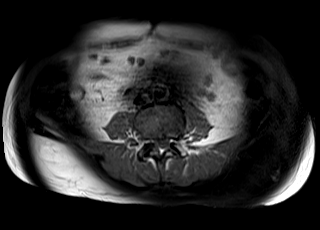

[Series 25: T1 post-contrast · axial · 4.0mm · 1.16mm/px · z∈[-166,+128]mm · 7 of 60 slices shown (1 of 2)]
[im 1/60]
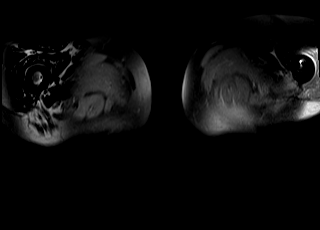
[im 10/60]
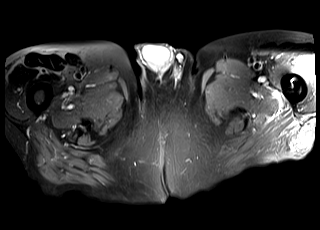
[im 20/60]
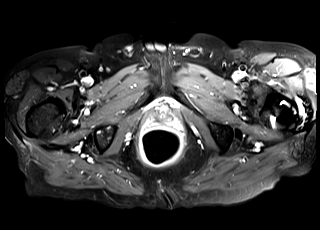
[im 30/60]
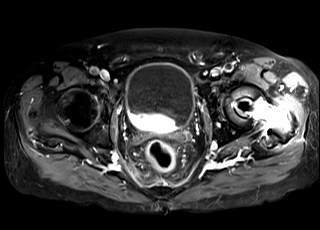
[im 40/60]
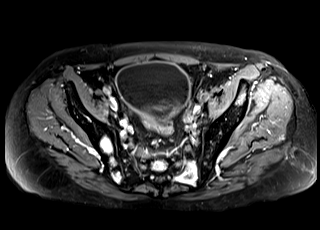
[im 50/60]
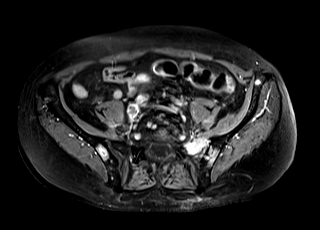
[im 60/60]
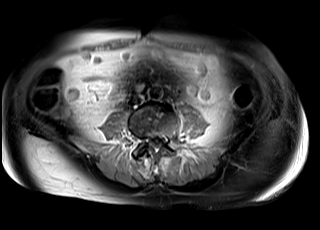

[Series 26: T1 post-contrast · coronal · 5.0mm · 1.19mm/px · 4 of 30 slices shown (2 of 2)]
[im 1/30]
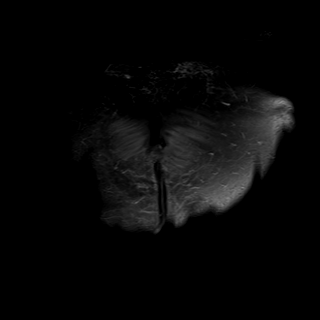
[im 10/30]
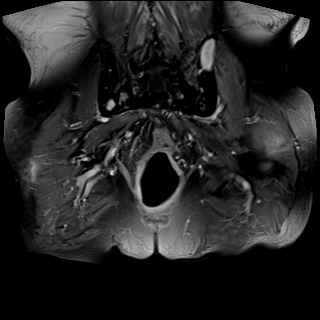
[im 20/30]
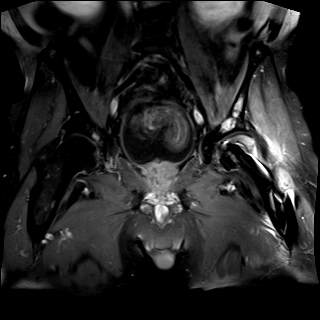
[im 30/30]
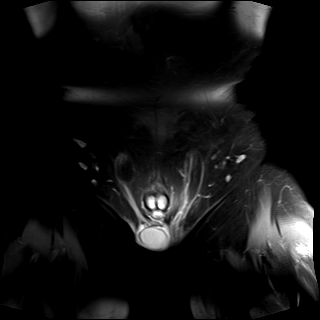

[48 of 48 positions shown; findings below may reference images not displayed]

FINDINGS: Urinary Tract: The visualized distal ureters and bladder appear
unremarkable.

Bowel: No bowel wall thickening, distention or surrounding
inflammation identified within the pelvis.

Vascular/Lymphatic: No enlarged pelvic lymph nodes identified. No
significant vascular findings.

Reproductive: The prostate gland is grossly unremarkable.

Other: A small amount of presacral edema and trace free fluid seen
within the deep pelvis.

Musculoskeletal: Multiple heterogeneously enhancing T2 bright/T1
dark osseous blastic lesions are seen throughout the visualized
portion of the lumbar spine and pelvis as on the recent PET CT. The
patient is status post left ORIF with IM nail fixation. There is a
large lytic lesion within the left proximal femur and femoral neck
with healing fracture. No acute fracture is seen. The sacroiliac
joints are intact. No large hip joint effusions are seen.
IMPRESSION: Multiple lytic lesions throughout the lumbar spine and pelvis
consistent with the patient's no are myomatous lesions. No
significant change from the prior exam.

No acute fracture or malalignment.

Small amount of presacral edema and free fluid in the deep pelvis.

## 2020-10-26 IMAGING — CT CT HEAD W/O CM
3 series · 15 of 46 positions shown, 18 images · non-contrast
Comparison: [DATE]

CLINICAL DATA: TIA.  Weakness and falling.

EXAM:
CT HEAD WITHOUT CONTRAST
TECHNIQUE: Contiguous axial images were obtained from the base of the skull
through the vertex without intravenous contrast.

[Series 2: head wo · axial · 0.47mm/px · z∈[-139,-19]mm · 9 of 29 slices shown, 12 images]
[im 3/29  brain]
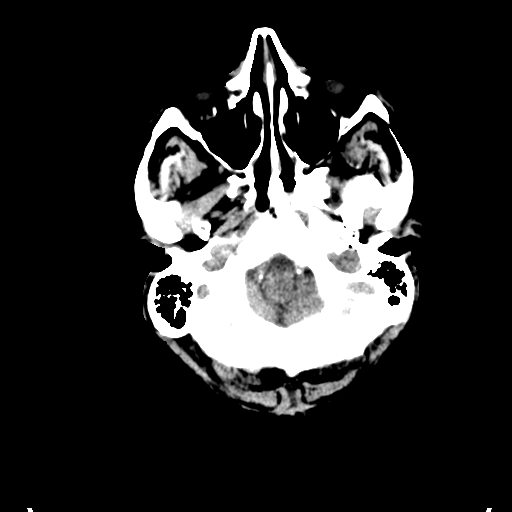
[im 3/29  bone]
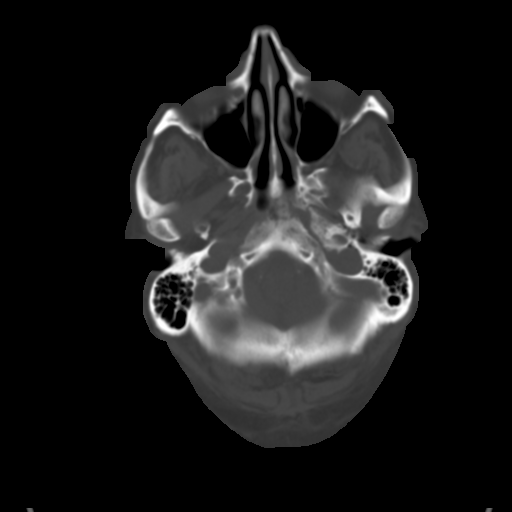
[im 6/29  brain]
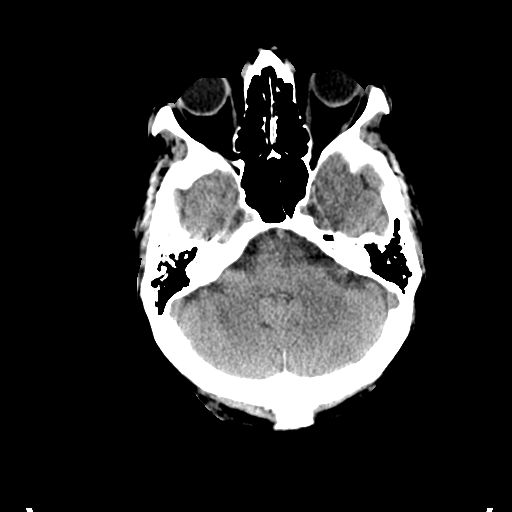
[im 9/29  brain]
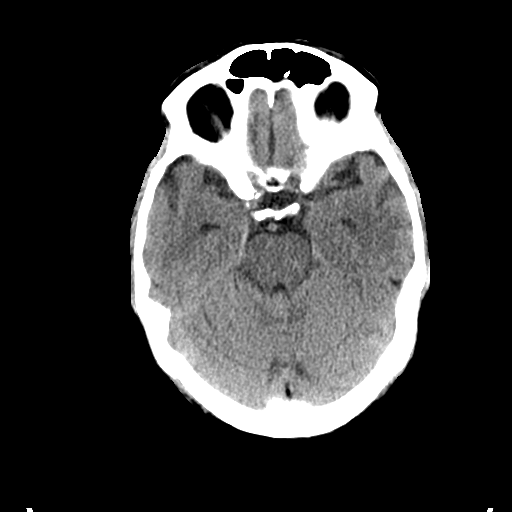
[im 12/29  brain]
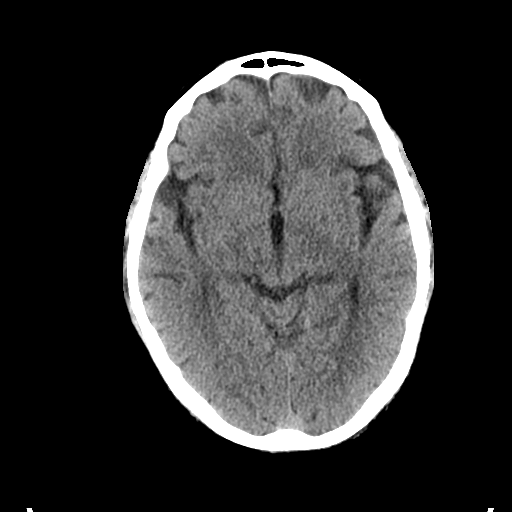
[im 15/29  brain]
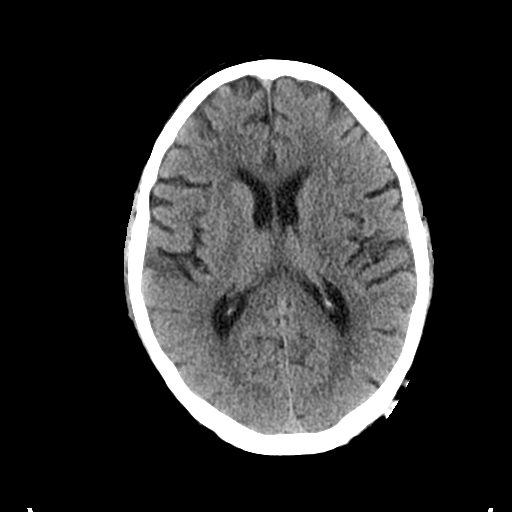
[im 15/29  bone]
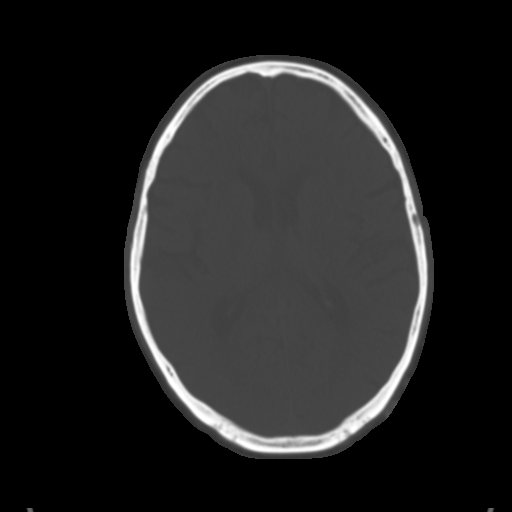
[im 18/29  brain]
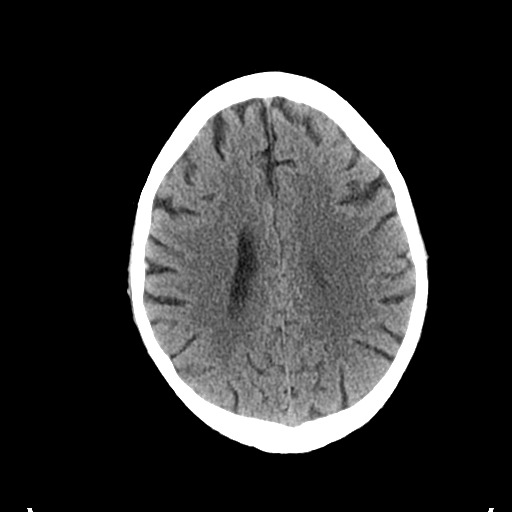
[im 21/29  brain]
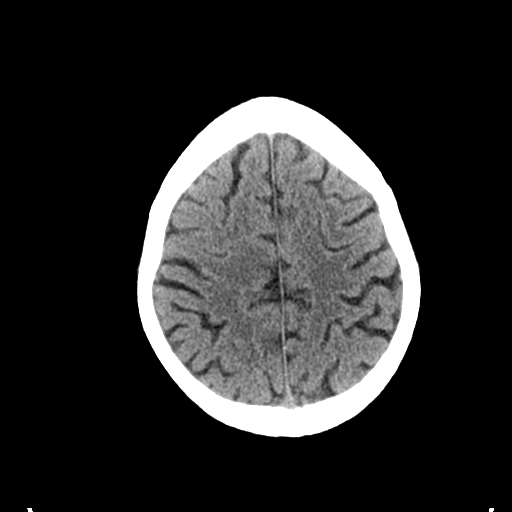
[im 24/29  brain]
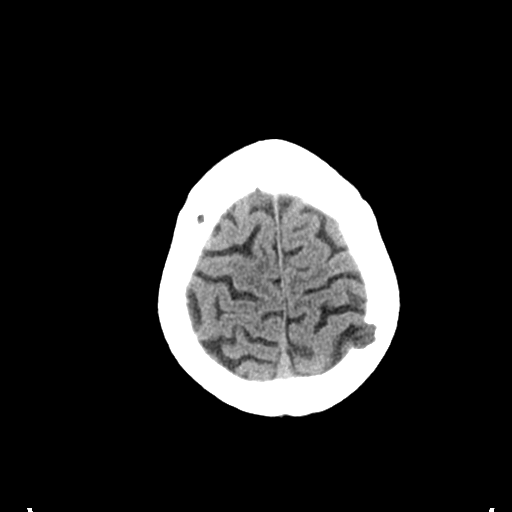
[im 27/29  brain]
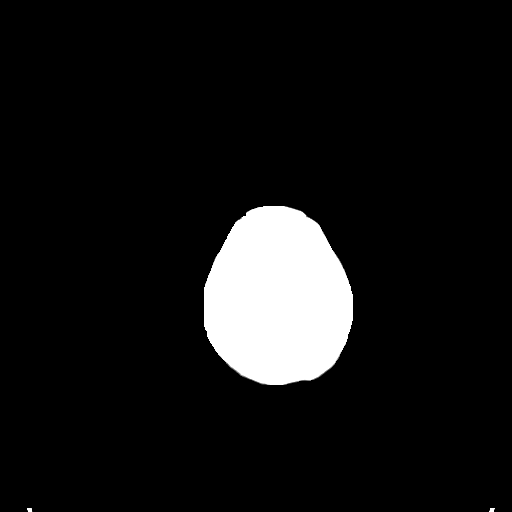
[im 27/29  bone]
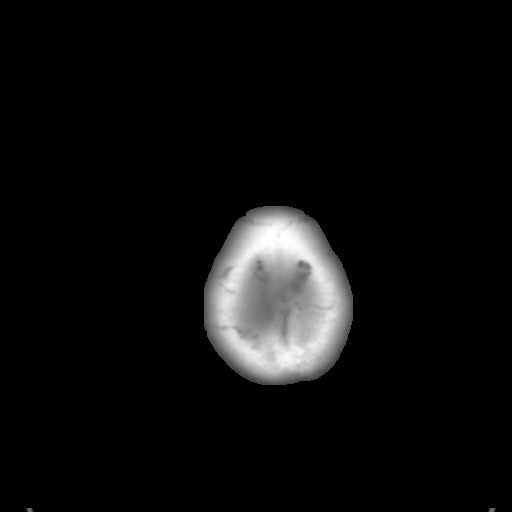

[Series 5: coronal soft tissue · coronal · 0.32mm/px · 3 of 72 slices shown]
[im 24/72  brain]
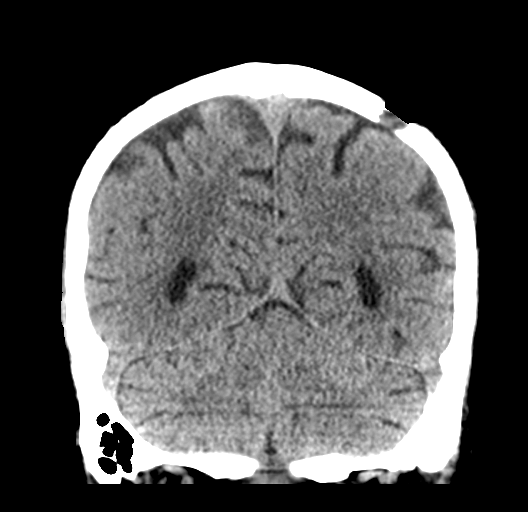
[im 32/72  brain]
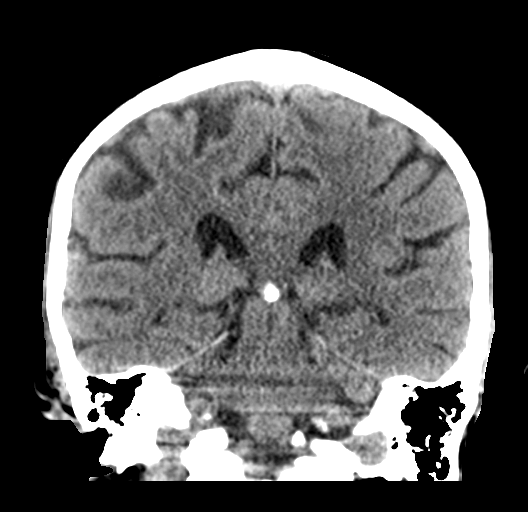
[im 40/72  brain]
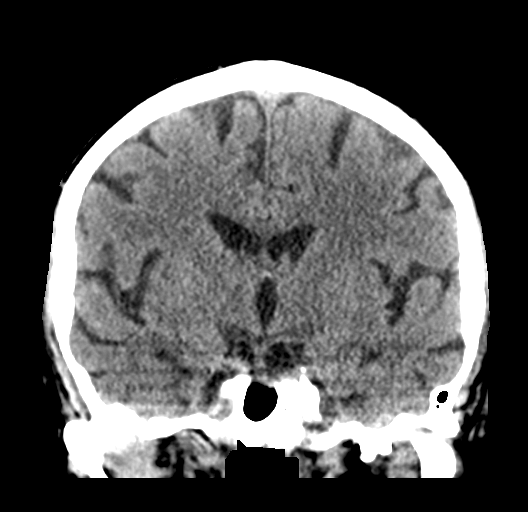

[Series 6: sagittal soft tissue · sagittal · 0.29mm/px · 3 of 59 slices shown]
[im 20/59  brain]
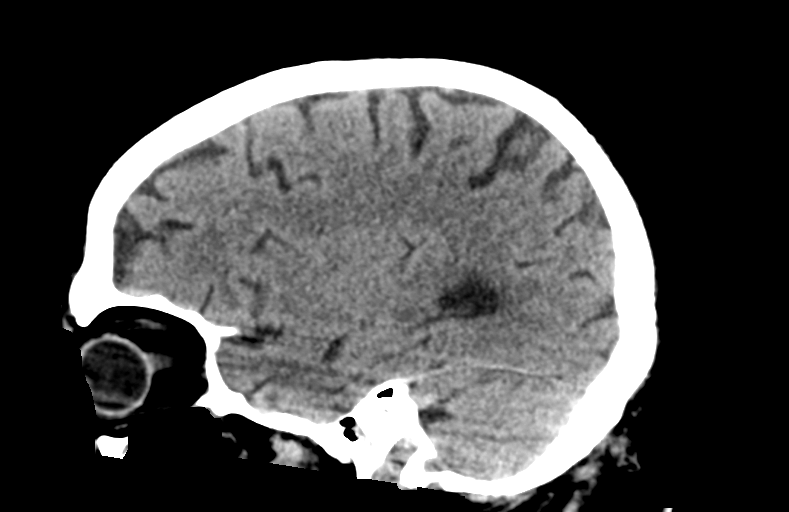
[im 30/59  brain]
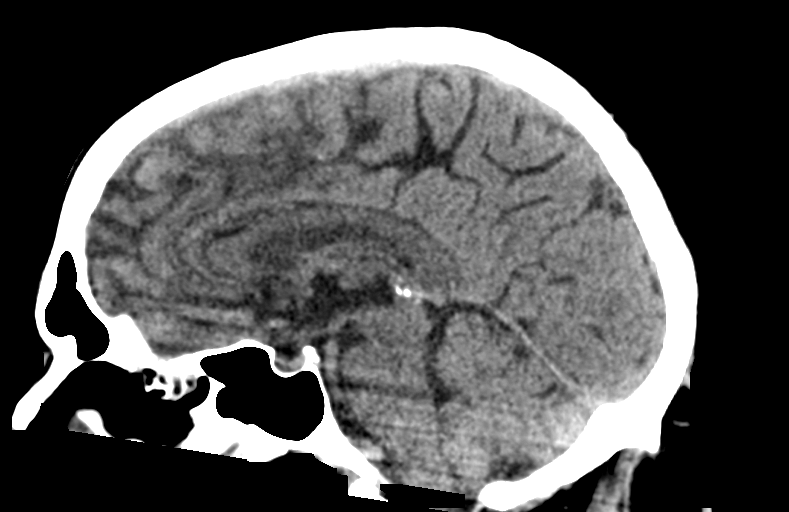
[im 39/59  brain]
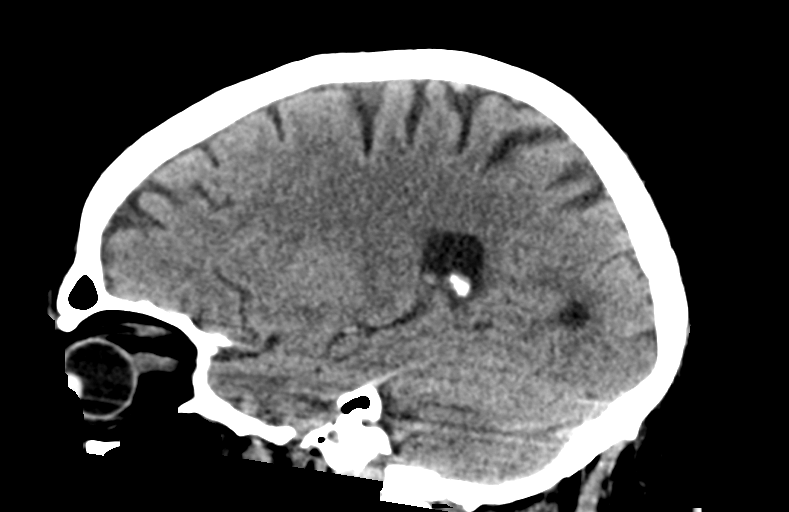

[15 of 46 positions shown; findings below may reference images not displayed]

FINDINGS: Brain: The brain does not show accelerated atrophy. No evidence of
old or acute focal infarction, mass lesion, hemorrhage,
hydrocephalus or extra-axial collection.

Vascular: There is atherosclerotic calcification of the major
vessels at the base of the brain.

Skull: Myeloma lytic lesions within the calvarium, unchanged since
[REDACTED]. The largest is in the left parietal calvarium measuring
about 1.5 cm in diameter. No sign of progressive lytic disease.

Sinuses/Orbits: Clear/normal

Other: None
IMPRESSION: 1. No acute or reversible finding. Normal appearance of the brain
itself.
2. Myeloma lytic lesions within the calvarium, unchanged since
[REDACTED]. The largest is in the left parietal calvarium measuring
about 1.5 cm in diameter. No sign of progressive lytic disease.

## 2020-10-26 IMAGING — MR MR LUMBAR SPINE WO/W CM
5 of 7 series · 32 of 48 positions shown · IV contrast (gadavist)
Comparison: None.

CLINICAL DATA: Multiple myeloma, increased weakness and falls

EXAM:
MRI LUMBAR SPINE WITHOUT AND WITH CONTRAST
TECHNIQUE: Multiplanar and multiecho pulse sequences of the lumbar spine were
obtained without and with intravenous contrast.
CONTRAST:  8mL GADAVIST GADOBUTROL 1 MMOL/ML IV SOLN

[Series 5: T1 · sagittal · 4.0mm · 0.81mm/px · 6 of 19 slices shown (1 of 2)]
[im 1/19]
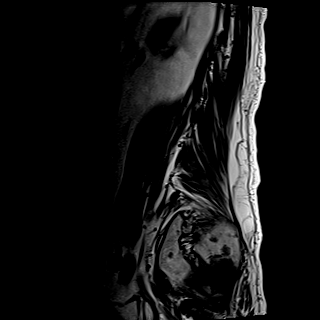
[im 4/19]
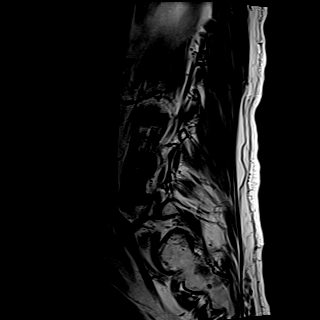
[im 8/19]
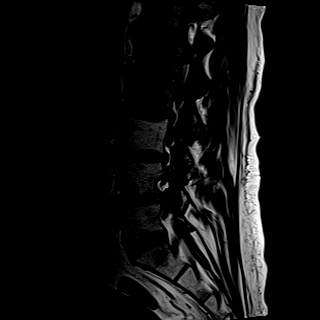
[im 11/19]
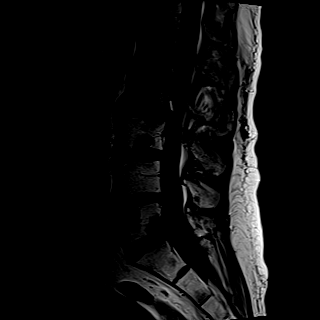
[im 15/19]
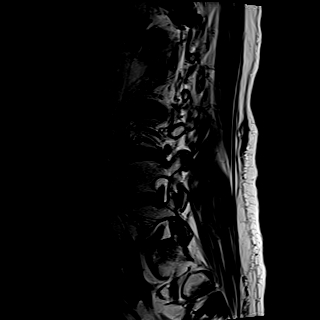
[im 19/19]
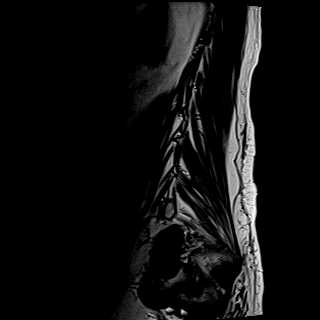

[Series 7: T2 · axial · 4.0mm · 0.62mm/px · z∈[-59,+123]mm · 9 of 37 slices shown]
[im 1/37]
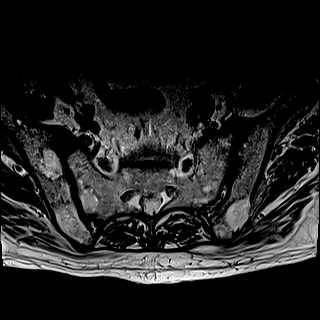
[im 5/37]
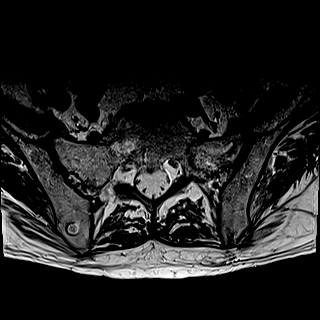
[im 10/37]
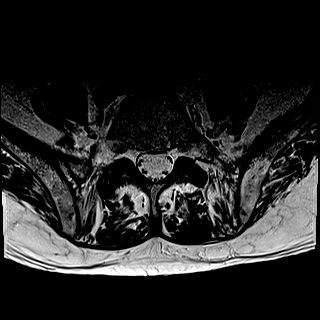
[im 14/37]
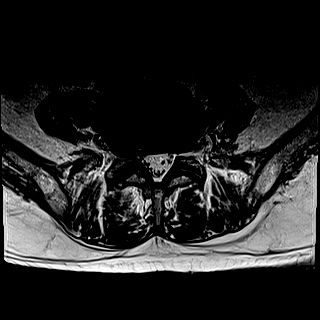
[im 19/37]
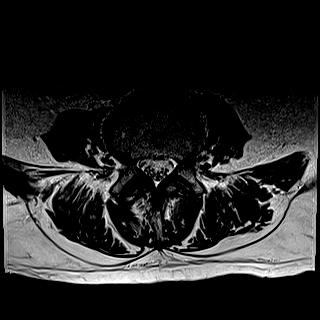
[im 23/37]
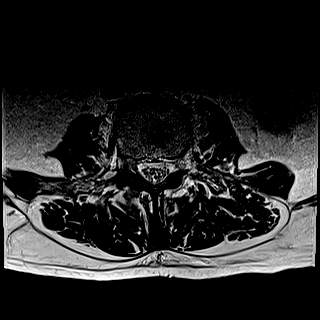
[im 28/37]
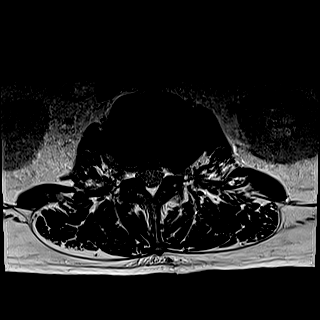
[im 32/37]
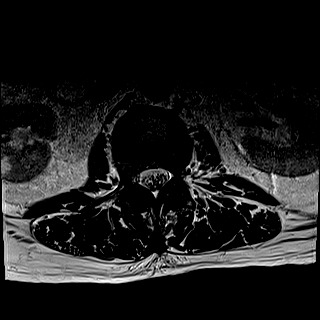
[im 37/37]
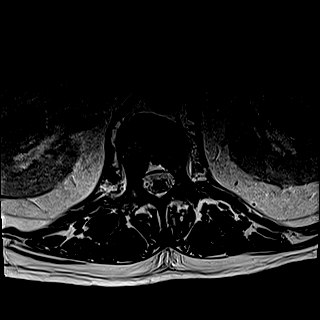

[Series 8: T1 · axial · 4.0mm · 0.39mm/px · z∈[-59,+123]mm · 9 of 37 slices shown (2 of 2)]
[im 1/37]
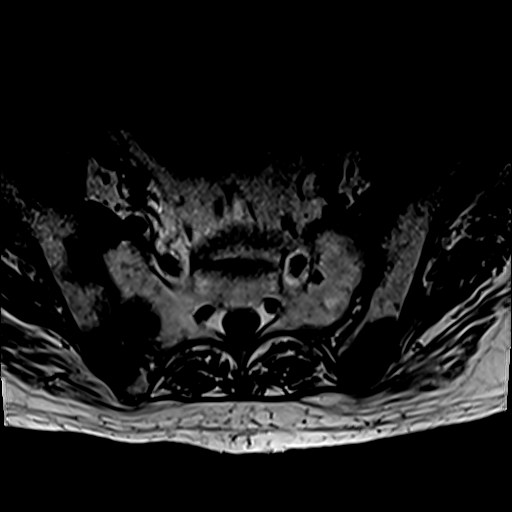
[im 5/37]
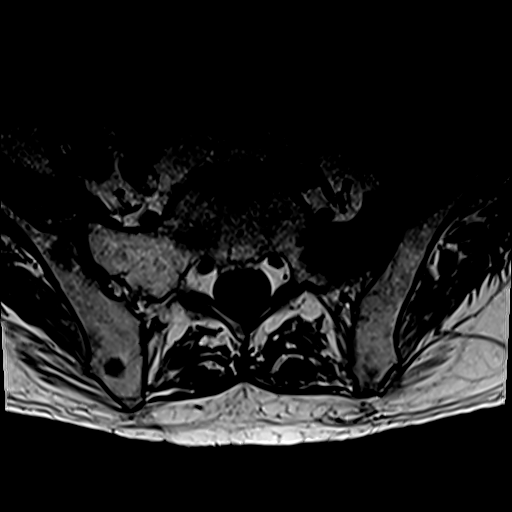
[im 10/37]
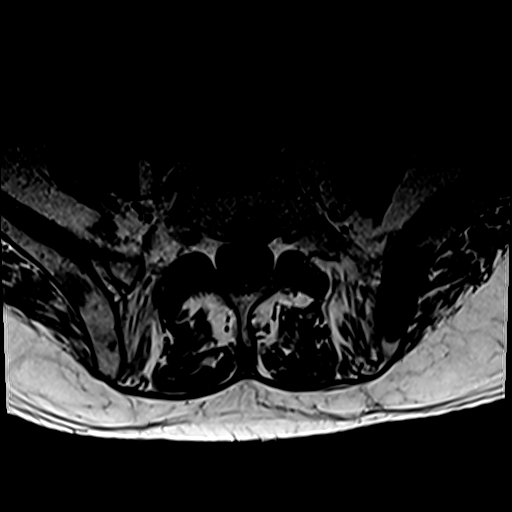
[im 14/37]
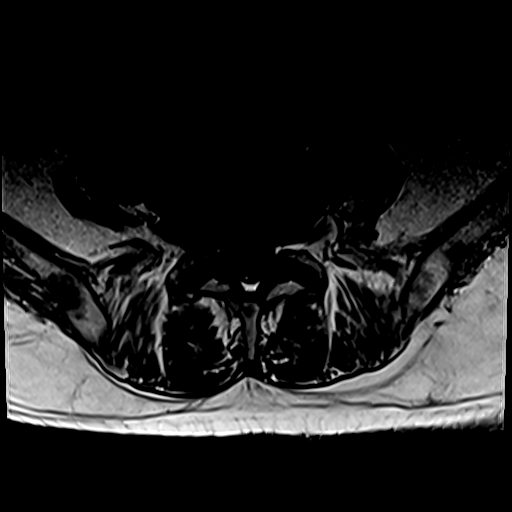
[im 19/37]
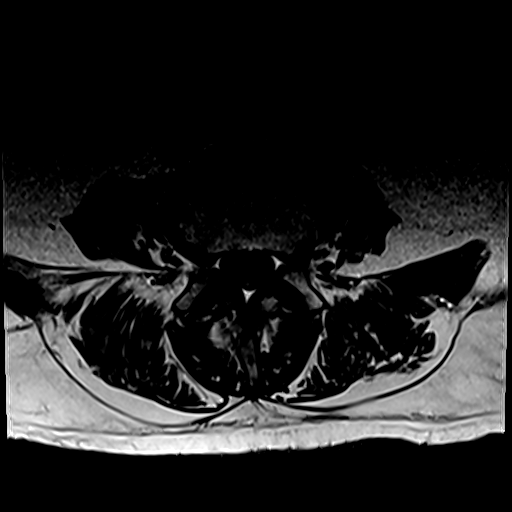
[im 23/37]
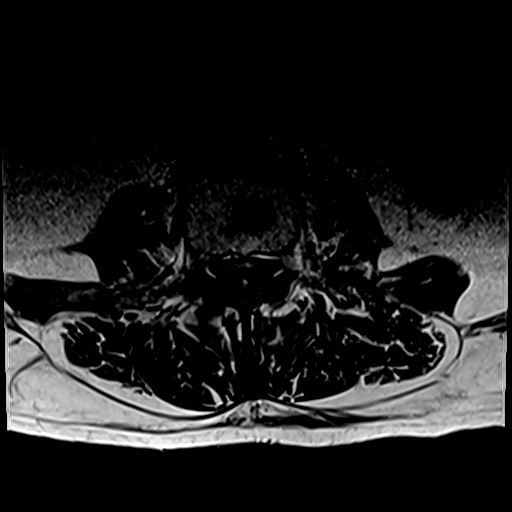
[im 28/37]
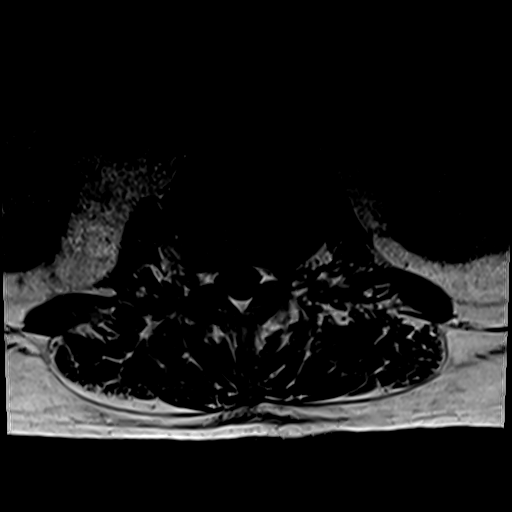
[im 32/37]
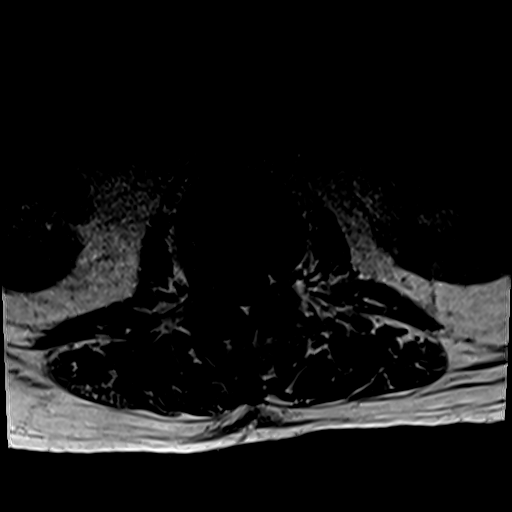
[im 37/37]
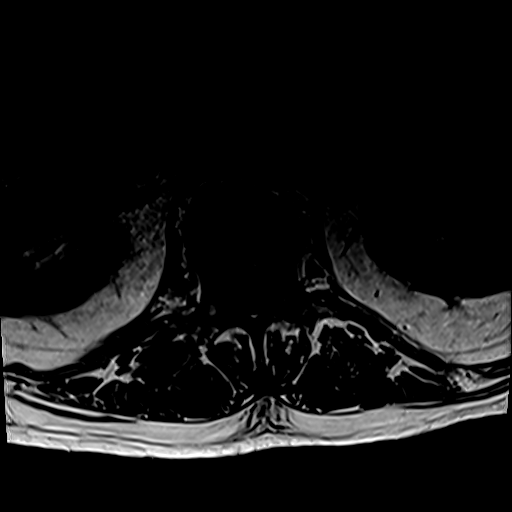

[Series 31: T2 post-contrast · sagittal · 4.0mm · 0.84mm/px · 5 of 19 slices shown]
[im 1/19]
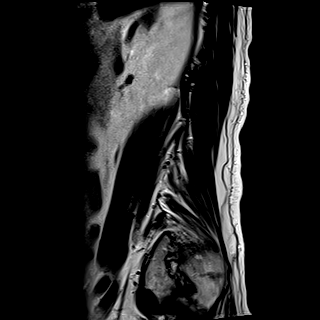
[im 5/19]
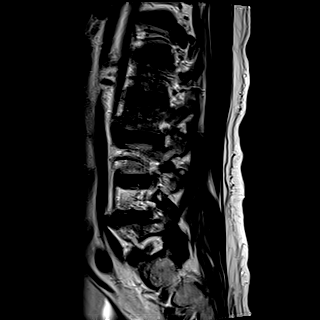
[im 10/19]
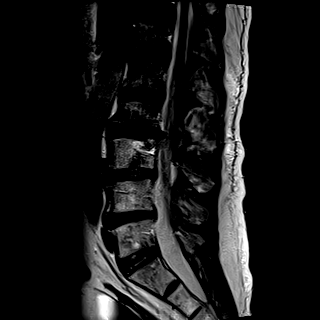
[im 14/19]
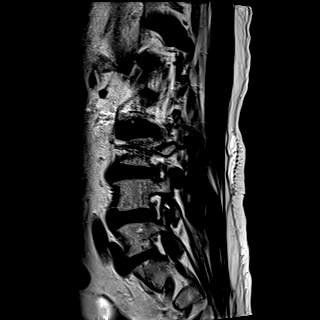
[im 19/19]
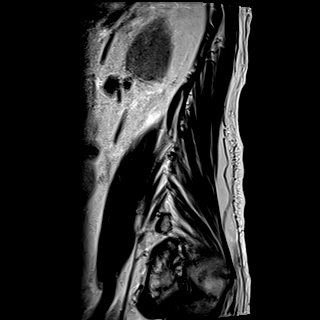

[Series 32: T1 fat-sat post-contrast · sagittal · 4.0mm · 0.81mm/px · 3 of 19 slices shown]
[im 1/19]
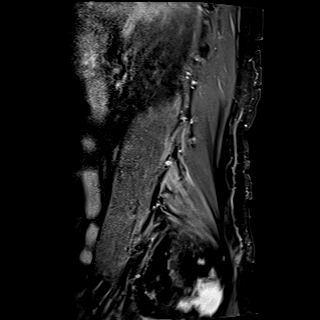
[im 5/19]
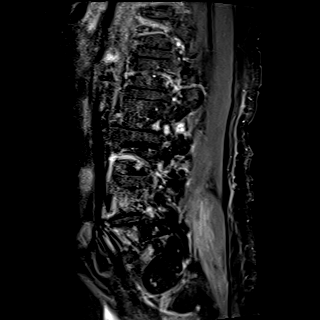
[im 10/19]
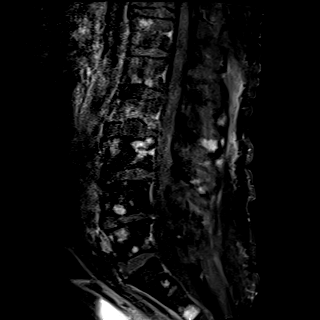

[32 of 48 positions shown; findings below may reference images not displayed]

FINDINGS: Motion artifact is present.

Segmentation:  Standard.

Alignment:  No significant anteroposterior listhesis.

Vertebrae: Multifocal STIR hyperintensity and enhancement throughout
the visualized osseous structures including the lumbar spine. There
is compression deformity of L2 with loss of height at the inferior
endplate measuring less than 50%. Milder loss of height is present
at the inferior endplate of L1. No significant epidural disease.

Conus medullaris and cauda equina: Conus extends to the L1-L2 level.
Conus and cauda equina appear normal. There is a fatty filum
terminale. No definite intrathecal enhancement with limitation of
motion artifact.

Paraspinal and other soft tissues: Unremarkable.

Disc levels:

L1-L2:  No canal or foraminal stenosis.

L2-L3: Disc bulge with superimposed left foraminal protrusion. Facet
arthropathy with ligamentum flavum infolding. Mild to moderate canal
stenosis with partial effacement of the subarticular recesses. Minor
right foraminal stenosis. Moderate left foraminal stenosis.

L3-L4: Disc bulge. Facet arthropathy with ligamentum flavum
infolding. Mild canal stenosis. Mild to moderate right foraminal
stenosis. Minor left foraminal stenosis.

L4-L5: Disc bulge with endplate osteophytic ridging eccentric to the
right. Facet arthropathy with ligamentum flavum infolding. No canal
stenosis. Partial effacement of the right subarticular recess. Mild
right foraminal stenosis. Minor left foraminal stenosis.

L5-S1:  Facet arthropathy.  No canal or foraminal stenosis.
IMPRESSION: Lesions throughout the included osseous structures reflecting
multiple myeloma. No significant epidural disease.

No acute compression deformity.

Multilevel degenerative changes as detailed above.

## 2020-10-26 MED ORDER — ASPIRIN EC 81 MG PO TBEC
81.0000 mg | DELAYED_RELEASE_TABLET | Freq: Every day | ORAL | Status: DC
  Administered 2020-10-26 – 2020-10-27 (×2): 81 mg via ORAL
  Filled 2020-10-26 (×2): qty 1

## 2020-10-26 MED ORDER — ENOXAPARIN SODIUM 40 MG/0.4ML ~~LOC~~ SOLN
40.0000 mg | SUBCUTANEOUS | Status: DC
  Administered 2020-10-26: 40 mg via SUBCUTANEOUS
  Filled 2020-10-26: qty 0.4

## 2020-10-26 MED ORDER — SODIUM CHLORIDE 0.9 % IV SOLN
1000.0000 mL | INTRAVENOUS | Status: DC
  Administered 2020-10-26 – 2020-10-27 (×2): 1000 mL via INTRAVENOUS
  Administered 2020-10-27: 125 mL via INTRAVENOUS

## 2020-10-26 MED ORDER — TRAMADOL HCL 50 MG PO TABS: 50.0000 mg | ORAL_TABLET | Freq: Three times a day (TID) | ORAL | Status: DC | PRN

## 2020-10-26 MED ORDER — ATORVASTATIN CALCIUM 20 MG PO TABS: 20.0000 mg | ORAL_TABLET | Freq: Every day | ORAL | Status: DC

## 2020-10-26 MED ORDER — SODIUM CHLORIDE 0.9 % IV BOLUS (SEPSIS)
1000.0000 mL | Freq: Once | INTRAVENOUS | Status: AC
  Administered 2020-10-26: 1000 mL via INTRAVENOUS

## 2020-10-26 MED ORDER — PRASUGREL HCL 10 MG PO TABS
10.0000 mg | ORAL_TABLET | Freq: Every day | ORAL | Status: DC
  Administered 2020-10-26 – 2020-10-27 (×2): 10 mg via ORAL
  Filled 2020-10-26 (×2): qty 1

## 2020-10-26 MED ORDER — LORAZEPAM 2 MG/ML IJ SOLN
1.0000 mg | Freq: Once | INTRAMUSCULAR | Status: AC
  Administered 2020-10-26: 1 mg via INTRAVENOUS
  Filled 2020-10-26: qty 1

## 2020-10-26 MED ORDER — PREGABALIN 75 MG PO CAPS
75.0000 mg | ORAL_CAPSULE | Freq: Two times a day (BID) | ORAL | Status: DC
  Administered 2020-10-26 – 2020-10-27 (×2): 75 mg via ORAL
  Filled 2020-10-26 (×2): qty 1

## 2020-10-26 MED ORDER — LORAZEPAM 0.5 MG PO TABS: 0.5000 mg | ORAL_TABLET | Freq: Four times a day (QID) | ORAL | Status: DC | PRN

## 2020-10-26 MED ORDER — MORPHINE SULFATE 15 MG PO TABS
15.0000 mg | ORAL_TABLET | Freq: Four times a day (QID) | ORAL | Status: DC | PRN
  Administered 2020-10-26 – 2020-10-27 (×3): 15 mg via ORAL
  Filled 2020-10-26 (×3): qty 1

## 2020-10-26 MED ORDER — GADOBUTROL 1 MMOL/ML IV SOLN
8.0000 mL | Freq: Once | INTRAVENOUS | Status: AC | PRN
  Administered 2020-10-26: 8 mL via INTRAVENOUS

## 2020-10-26 MED ORDER — HYDROMORPHONE HCL 1 MG/ML IJ SOLN
1.0000 mg | INTRAMUSCULAR | Status: DC | PRN
  Administered 2020-10-26 (×2): 1 mg via INTRAVENOUS
  Filled 2020-10-26 (×2): qty 1

## 2020-10-26 MED ORDER — METOPROLOL SUCCINATE ER 50 MG PO TB24
50.0000 mg | ORAL_TABLET | Freq: Every day | ORAL | Status: DC
  Administered 2020-10-26 – 2020-10-27 (×2): 50 mg via ORAL
  Filled 2020-10-26 (×2): qty 1

## 2020-10-26 MED ORDER — TRIAMTERENE-HCTZ 75-50 MG PO TABS
1.0000 | ORAL_TABLET | Freq: Every day | ORAL | Status: DC
  Administered 2020-10-26 – 2020-10-27 (×2): 1 via ORAL
  Filled 2020-10-26 (×2): qty 1

## 2020-10-26 MED ORDER — HYDROMORPHONE HCL 1 MG/ML IJ SOLN
1.0000 mg | Freq: Once | INTRAMUSCULAR | Status: AC
  Administered 2020-10-26: 1 mg via INTRAVENOUS
  Filled 2020-10-26: qty 1

## 2020-10-26 MED ORDER — VALACYCLOVIR HCL 500 MG PO TABS
1000.0000 mg | ORAL_TABLET | Freq: Every day | ORAL | Status: DC
  Administered 2020-10-26 – 2020-10-27 (×2): 1000 mg via ORAL
  Filled 2020-10-26 (×2): qty 2

## 2020-10-26 MED ORDER — OLANZAPINE 10 MG PO TABS
10.0000 mg | ORAL_TABLET | Freq: Every day | ORAL | Status: DC
  Administered 2020-10-26: 10 mg via ORAL
  Filled 2020-10-26: qty 1

## 2020-10-26 MED ORDER — PROCHLORPERAZINE MALEATE 10 MG PO TABS
10.0000 mg | ORAL_TABLET | Freq: Four times a day (QID) | ORAL | Status: DC | PRN
  Filled 2020-10-26: qty 1

## 2020-10-26 MED ORDER — HYDROMORPHONE HCL 1 MG/ML IJ SOLN
0.5000 mg | Freq: Once | INTRAMUSCULAR | Status: AC
  Administered 2020-10-26: 0.5 mg via INTRAVENOUS
  Filled 2020-10-26: qty 1

## 2020-10-26 NOTE — ED Provider Notes (Signed)
[facility] DEPT Provider Note   CSN: [id number] Arrival date & time: 10/26/20  1025     History Chief Complaint  Patient presents with  . Weakness    Bruce Smith is a 69 y.o. male.  HPI   Patient presented to ED for evaluation of weakness and falls. Patient has history of multiple myeloma. Last treatment was on the 21st. Patient states over the last several days he has had problems with increasing pain in his legs. He fell on Christmas. Family was able to get him up but he has had persistent pain in his legs. Patient states it hurts when he tries to walk or stand. This morning when he tried to get up and walk it was too painful. Patient states he has pain medications to take but did not take any this morning. Patient denies any trouble with fevers or chills. No cough or shortness of breath. No vomiting or diarrhea. According to the electronic medical record , the wife called the oncology office because the patient fell Christmas Day, yesterday and again this morning at about 5 AM. She told him the patient was complaining of pain in his thighs and he could not stand or walk. They advised the patient to come to the emergency room.  Past Medical History:  Diagnosis Date  . Atherosclerosis   . Coronary artery disease   . Goals of care, counseling/discussion 06/29/2020  . 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 Hyperlipidemia   . Hypertension   . 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 Multiple myeloma ([place]) 07/21/2020  . Myocardial infarction [facility])     Patient Active Problem List   Diagnosis Date Noted  . 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 Goals of care, counseling/discussion 06/29/2020  . 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 ANTERIOR CERVICAL DECOMP/DISCECTOMY FUSION N/A 06/17/2020   Procedure: ANTERIOR CERVICAL DECOMPRESSION/DISCECTOMY FUSION, INTERBODY PROSTHESIS, PLATE/SCREWS CERVICAL FIVE- CERVICAL SIX;  Surgeon: Newman Pies, MD;  Location: [facility];  Service: Neurosurgery;  Laterality: N/A;  ANTERIOR CERVICAL DECOMPRESSION/DISCECTOMY FUSION, INTERBODY PROSTHESIS, PLATE/SCREWS CERVICAL FIVE- CERVICAL SIX  . CARDIAC CATHETERIZATION    . FEMUR IM NAIL Left 07/14/2020   Procedure: OPEN REDUCTION INTERNAL FIXATION (ORIF LEFT FEMORAL NAIL FRACTURE;  Surgeon: Marchia Bond, MD;  Location: [facility];  Service: Orthopedics;  Laterality: Left;       History reviewed. No pertinent family history.  Social History   Tobacco Use  . Smoking status: Former Smoker    Quit date: 2000    Years since quitting: 22.0  . Smokeless tobacco: Current User    Types: Chew  Vaping Use  . Vaping Use: Never used  Substance Use Topics  . Drug use: Never    Home Medications Prior to Admission medications   Medication Sig Start Date End Date Taking? Authorizing Provider  acetaminophen (TYLENOL) 500 MG tablet Take 1,000 mg by mouth every 6 (six) hours.    [provider]  aspirin 81 MG EC tablet Take 81 mg by mouth daily. Swallow whole.    [provider]  atorvastatin (LIPITOR) 20 MG tablet Take 20 mg by mouth daily.    [provider]  atorvastatin (LIPITOR) 40 MG tablet TAKE 1/2 TABLET BY MOUTH DAILY FOR CHOLESTEROL 09/09/20   [provider]  baclofen (LIORESAL) 10 MG tablet Take 1 tablet (10 mg total) by mouth 3 (three) times daily. As needed for muscle spasm 07/14/20   Ventura Bruns, PA-C  Coenzyme Q10 (CO Q-10) 200 MG CAPS Take 200 mg by mouth daily.    [provider]  cyclobenzaprine (FLEXERIL) 10 MG tablet Take 1 tablet (10 mg total) by mouth 3 (three) times daily as needed for muscle spasms. 06/18/20   Viona Gilmore D, NP  dexamethasone (DECADRON) 4 MG tablet Take 1  tablet (4 mg total) by mouth daily. Take for 3 days after chemotherapy treatment. Take with meals. 08/11/20   Volanda Napoleon, MD  dronabinol (MARINOL) 5 MG capsule Take 1 capsule (5 mg total) by mouth 2 (two) times daily before a meal. 08/11/20   Ennever, Rudell Cobb, MD  famciclovir (FAMVIR) 500 MG tablet Take 1 tablet (500 mg total) by mouth daily. 08/11/20   Volanda Napoleon, MD  fentaNYL (DURAGESIC) 25 MCG/HR Place 1 patch onto the skin every 3 (three) days. 10/13/20   Volanda Napoleon, MD  hydrOXYzine (ATARAX/VISTARIL) 10 MG tablet Take 1 tablet (10 mg total) by mouth 3 (three) times daily as needed. 08/28/20   Volanda Napoleon, MD  lenalidomide (REVLIMID) 25 MG capsule Take 1 capsule (25 mg total) by mouth daily. 14 days on, then 14 days off. [employer] Auth # [id number] 09/30/20   Volanda Napoleon, MD  lisinopril (ZESTRIL) 5 MG tablet Take 7.5 mg by mouth daily. 03/10/20   [provider]  LORazepam (ATIVAN) 0.5 MG tablet Take 1 tablet (0.5 mg total) by mouth every 6 (six) hours as needed for anxiety (nausea). 07/21/20   Volanda Napoleon, MD  metoprolol succinate (TOPROL-XL) 50 MG 24 hr tablet Take 50 mg by mouth daily. 03/10/20   [provider]  morphine (MS CONTIN) 15 MG 12 hr tablet Take 1 tablet (15 mg total) by mouth every 12 (twelve) hours. 07/21/20   Volanda Napoleon, MD  morphine (MSIR) 15 MG tablet Take 1 tablet (15 mg total) by mouth every 6 (six) hours as needed for severe pain. 07/21/20   Volanda Napoleon, MD  naloxone [facility]) nasal spray 4 mg/0.1 mL Use as directed on the package. 10/14/20   Volanda Napoleon, MD  nitroGLYCERIN (NITROSTAT) 0.4 MG SL tablet Place 0.4 mg under the tongue every 5 (five) minutes as needed for chest pain.    [provider]  OLANZapine (ZYPREXA) 10 MG tablet Take 1 tablet (10 mg total) by mouth at bedtime. 08/11/20   Volanda Napoleon, MD  omeprazole (PRILOSEC) 40 MG capsule Take by mouth. 08/06/20 08/06/21  [provider]   ondansetron (ZOFRAN) 8 MG tablet Take 1 tablet (8 mg total) by mouth every 8 (eight) hours as needed for nausea or vomiting. 07/22/20   Gery Pray, MD  Polyethylene Glycol 3350 (MIRALAX PO) Take 2 Scoops by mouth daily.    [provider]  Polyethylene Glycol 3350 (MIRALAX PO) Take by mouth as needed.    [provider]  prasugrel (EFFIENT) 10 MG TABS tablet Take 10 mg by mouth daily. 05/12/20   [provider]  pregabalin (LYRICA) 75 MG capsule Take 1 capsule (75 mg total) by mouth 2 (two) times daily. 10/20/20   Volanda Napoleon, MD  prochlorperazine (COMPAZINE) 10 MG tablet Take 1 tablet (10 mg total) by mouth every 6 (six) hours as needed for nausea or vomiting. 07/28/20   Gery Pray, MD  promethazine (  PHENERGAN) 25 MG tablet Take 1 tablet (25 mg total) by mouth every 8 (eight) hours as needed for nausea or vomiting. 10/13/20   Volanda Napoleon, MD  rivaroxaban (XARELTO) 10 MG TABS tablet Take 1 tablet (10 mg total) by mouth daily. 07/14/20   Merlene Pulling K, PA-C  sennosides-docusate sodium (SENOKOT-S) 8.6-50 MG tablet Take 2 tablets by mouth daily. 07/14/20   Ventura Bruns, PA-C  silver sulfADIAZINE (SILVADENE) 1 % cream Apply 1 application topically 2 (two) times daily. 10/06/20   Volanda Napoleon, MD  traMADol (ULTRAM) 50 MG tablet Take 1 tablet (50 mg total) by mouth 3 (three) times daily as needed for moderate pain. 09/10/20   Gery Pray, MD  triamterene-hydrochlorothiazide (MAXZIDE) 75-50 MG tablet Take 1 tablet by mouth daily. 09/15/20   Volanda Napoleon, MD    Allergies    Patient has no known allergies.  Review of Systems   Review of Systems  All other systems reviewed and are negative.   Physical Exam Updated Vital Signs BP (!) 171/85 (BP Location: Left Arm)   Pulse 78   Temp 97.9 F (36.6 C) (Oral)   Resp 18   SpO2 97%   Physical Exam Vitals and nursing note reviewed.  Constitutional:      Appearance: He is well-developed and  well-nourished. He is not toxic-appearing or diaphoretic.  HENT:     Head: Normocephalic and atraumatic.     Right Ear: External ear normal.     Left Ear: External ear normal.  Eyes:     General: No scleral icterus.       Right eye: No discharge.        Left eye: No discharge.     Conjunctiva/sclera: Conjunctivae normal.  Neck:     Trachea: No tracheal deviation.  Cardiovascular:     Rate and Rhythm: Normal rate and regular rhythm.     Pulses: Intact distal pulses.  Pulmonary:     Effort: Pulmonary effort is normal. No respiratory distress.     Breath sounds: Normal breath sounds. No stridor. No wheezing or rales.  Abdominal:     General: Bowel sounds are normal. There is no distension.     Palpations: Abdomen is soft.     Tenderness: There is no abdominal tenderness. There is no guarding or rebound.  Musculoskeletal:        General: Tenderness present. No swelling, deformity or edema.     Cervical back: Neck supple.     Right lower leg: No edema.     Left lower leg: No edema.     Comments: Mild tenderness palpation right elbow but full range of motion, no cervical thoracic or lumbar spinal tenderness, mild tenderness to palpation bilateral thighs but no discrete areas of tenderness, no deformity or swelling  Skin:    General: Skin is warm.     Findings: No rash.  Neurological:     General: No focal deficit present.     Mental Status: He is alert.     Cranial Nerves: No cranial nerve deficit (no facial droop, extraocular movements intact, no slurred speech).     Sensory: No sensory deficit.     Motor: No abnormal muscle tone or seizure activity.     Coordination: Coordination normal.     Deep Tendon Reflexes: Strength normal.  Psychiatric:        Mood and Affect: Mood and affect normal.     ED Results / Procedures / Treatments  Labs (all labs ordered are listed, but only abnormal results are displayed) Labs Reviewed  CBC WITH DIFFERENTIAL/PLATELET - Abnormal; Notable  for the following components:      Result Value   RBC 4.07 (*)    Hemoglobin 12.2 (*)    HCT 36.7 (*)    RDW 16.0 (*)    Lymphs Abs 0.3 (*)    Abs Immature Granulocytes 0.09 (*)    All other components within normal limits  BASIC METABOLIC PANEL - Abnormal; Notable for the following components:   Sodium 134 (*)    Glucose, Bld 125 (*)    Calcium 8.6 (*)    All other components within normal limits  URINALYSIS, ROUTINE W REFLEX MICROSCOPIC - Abnormal; Notable for the following components:   Ketones, ur 5 (*)    All other components within normal limits     Radiology DG Elbow 2 Views Right  Result Date: 10/26/2020 CLINICAL DATA:  Pain following fall EXAM: RIGHT ELBOW - 2 VIEW COMPARISON:  None. FINDINGS: Frontal and lateral views were obtained. No fracture or dislocation. No joint effusion. No appreciable joint space narrowing. Prominent olecranon process spur noted. Small spur along the medial distal humeral condyle. IMPRESSION: No fracture or dislocation. No appreciable joint space narrowing. Prominent olecranon process spur. Small spur along the medial distal humeral condyle likely represents focal calcific tendinosis. Electronically Signed   By: Lowella Grip III M.D.   On: 10/26/2020 12:10   CT Head Wo Contrast  Result Date: 10/26/2020 CLINICAL DATA:  TIA.  Weakness and falling. EXAM: CT HEAD WITHOUT CONTRAST TECHNIQUE: Contiguous axial images were obtained from the base of the skull through the vertex without intravenous contrast. COMPARISON:  06/26/2020 FINDINGS: Brain: The brain does not show accelerated atrophy. No evidence of old or acute focal infarction, mass lesion, hemorrhage, hydrocephalus or extra-axial collection. Vascular: There is atherosclerotic calcification of the major vessels at the base of the brain. Skull: Myeloma lytic lesions within the calvarium, unchanged since August. The largest is in the left parietal calvarium measuring about 1.5 cm in diameter. No  sign of progressive lytic disease. Sinuses/Orbits: Clear/normal Other: None IMPRESSION: 1. No acute or reversible finding. Normal appearance of the brain itself. 2. Myeloma lytic lesions within the calvarium, unchanged since August. The largest is in the left parietal calvarium measuring about 1.5 cm in diameter. No sign of progressive lytic disease. Electronically Signed   By: Nelson Chimes M.D.   On: 10/26/2020 11:48   DG Hips Bilat W or Wo Pelvis 3-4 Views  Result Date: 10/26/2020 CLINICAL DATA:  Pain following fall EXAM: DG HIP (WITH OR WITHOUT PELVIS) 3-4V BILAT COMPARISON:  None. FINDINGS: Frontal pelvis as well as frontal and lateral views of each hip joint-total 5-views obtained. There is postoperative screw and nail fixation in the left femur with alignment anatomic. Tip of screw in proximal left femur. No acute fracture or dislocation. There is mild symmetric narrowing of each hip joint. There is bony overgrowth along the superolateral right acetabulum. Sacroiliac joints appear unremarkable. IMPRESSION: Postoperative changes in the left femur. Slight narrowing of each hip joint. Bony overgrowth along the superolateral right acetabulum likely of arthropathic etiology. No acute fracture or dislocation. Electronically Signed   By: Lowella Grip III M.D.   On: 10/26/2020 12:11    Procedures Procedures (including critical care time)  Medications Ordered in ED Medications  sodium chloride 0.9 % bolus 1,000 mL (0 mLs Intravenous Stopped 10/26/20 1333)    Followed by  0.9 %  sodium chloride infusion (1,000 mLs Intravenous New Bag/Given 10/26/20 1232)  HYDROmorphone (DILAUDID) injection 1 mg (has no administration in time range)  HYDROmorphone (DILAUDID) injection 0.5 mg (0.5 mg Intravenous Given 10/26/20 1233)    ED Course  I have reviewed the triage vital signs and the nursing notes.  Pertinent labs & imaging results that were available during my care of the patient were reviewed by me  and considered in my medical decision making (see chart for details).  Clinical Course as of 10/26/20 1341  Mon Oct 26, 2020  1100 Records reviewed.  Last treatment on December 21 Velcade, Darzalex [RA]  3094 Patient's laboratory tests are stable. [JK]  1313 X-rays do not show signs of acute fracture [JK]  1314 CT scan without acute findings [JK]  1340 Patient was assisted to his feet but was unable to walk because of his weakness and pain [JK]    Clinical Course User Index [JK] Dorie Rank, MD   MDM Rules/Calculators/A&P                          Patient presented to the ED with complaints of frequent falls and increasing leg pain.  Patient just recently had a infusion for his known multiple myeloma.  In the ED the patient is having persistent pain in his legs.  X-rays do not show any signs of fracture.  Suspect this pain could be related to his multiple myeloma.  Patient attempted to stand and walk but was unable to do so.  He has normal strength and sensation in his lower legs.  I doubt spinal compression at this time however he is at risk with his multiple myeloma.  Will add on MRI lumbar spine and pelvis.  Plan on admission for further treatment. Final Clinical Impression(s) / ED Diagnoses Final diagnoses:  Multiple myeloma, remission status unspecified ([place])  Weakness  Pain of lower extremity, unspecified laterality      Dorie Rank, MD 10/26/20 1353

## 2020-10-26 NOTE — ED Notes (Signed)
Patient transported to MRI 

## 2020-10-26 NOTE — Telephone Encounter (Signed)
Returned wife's phone call regarding patient. She stated,"Tanay fell Christmas day. I didn't want to take him to the ER on Christmas day. Christmas night was awful. He fell yesterday(Sunday). He has fallen again this morning at 5:00 am. The pain in his thighs is bad. He can't stand or walk today." Per Dr. Myna Hidalgo, who is on-call today, he needs to go to [facility] ER and be assessed. I instructed the wife to call 911 due to his inability to stand or walk. She was going to try and put him in a wheelchair with his sister. I stated,"if he falls again with you two trying to transfer him to the car, he could hurt you if he falls, or he could fall and break a hip,leg, etc. Wife verbalized understanding of conversation.

## 2020-10-26 NOTE — ED Notes (Signed)
During orthostatic VS collection, pt was able to stand with assistance but was unable to maintain standing for during to BP collection.  Pt required two assist to be able to stand. Dr. Lynelle Doctor made aware.

## 2020-10-26 NOTE — ED Notes (Signed)
Pt transported to radiology.

## 2020-10-26 NOTE — ED Triage Notes (Signed)
Pt BIB EMS. Hx of multiple myeloma, last tx last Tuesday. Pt has had increased weakness and falls. Pt fell on Christmas and today. Pt wife called PCP and was then advised to bring pt to the ER.   BP-140/80 CBG-121 HR-98 O2-96%

## 2020-10-26 NOTE — ED Notes (Signed)
Pt to go upstairs after returning to MRI. Floor RN, Mandy aware.

## 2020-10-26 NOTE — H&P (Signed)
History and Physical    Bruce Smith BHA:[id number] DOB: 06-26-1951 DOA: 10/26/2020  PCP: Derrill Center., MD  Patient coming from: Home  Chief Complaint: leg pain  HPI: Bruce Smith is a 69 y.o. male with medical history significant of multiple myeloma, HTN, HLD. Presenting with worsening leg pain and falls. He has a history of chronic bilateral leg pain presumably secondary to his MM. He reports that he has felt worsening pain on the left over the last several days and it has led to a couple of falls. This morning he woke with more leg pain and another fall. He felt it was time to come to the ED. He remembers all his falls. There are no head injuries reported. He has never lost consciousness. He reports that he has known lytic lesions to his bones including his left pelvis. His pain is noted to be throbbing and constant.   ED Course: labwork was unremarkable. MRI ordered. TRH was called for admission.   Review of Systems: Review of systems is otherwise negative for all not mentioned in HPI.   PMHx Past Medical History:  Diagnosis Date  . Atherosclerosis   . Coronary artery disease   . Goals of care, counseling/discussion 06/29/2020  . 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 Hyperlipidemia   . Hypertension   . 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 Multiple myeloma ([place]) 07/21/2020  . Myocardial infarction [facility])     PSHx Past Surgical History:  Procedure Laterality Date  . ANTERIOR CERVICAL DECOMP/DISCECTOMY FUSION N/A 06/17/2020   Procedure: ANTERIOR CERVICAL DECOMPRESSION/DISCECTOMY FUSION, INTERBODY PROSTHESIS, PLATE/SCREWS CERVICAL FIVE- CERVICAL SIX;  Surgeon: Newman Pies, MD;  Location: [place];  Service: Neurosurgery;  Laterality: N/A;  ANTERIOR CERVICAL DECOMPRESSION/DISCECTOMY FUSION, INTERBODY PROSTHESIS, PLATE/SCREWS CERVICAL FIVE- CERVICAL SIX  . CARDIAC CATHETERIZATION    . FEMUR IM NAIL Left 07/14/2020   Procedure: OPEN REDUCTION INTERNAL  FIXATION (ORIF LEFT FEMORAL NAIL FRACTURE;  Surgeon: Marchia Bond, MD;  Location: [place];  Service: Orthopedics;  Laterality: Left;    SocHx  reports that he quit smoking about 22 years ago. His smokeless tobacco use includes chew. He reports that he does not use drugs. No history on file for alcohol use.  No Known Allergies  FamHx History reviewed. No pertinent family history.  Prior to Admission medications   Medication Sig Start Date End Date Taking? Authorizing Provider  acetaminophen (TYLENOL) 500 MG tablet Take 1,000 mg by mouth every 6 (six) hours.    [provider]  aspirin 81 MG EC tablet Take 81 mg by mouth daily. Swallow whole.    [provider]  atorvastatin (LIPITOR) 20 MG tablet Take 20 mg by mouth daily.    [provider]  atorvastatin (LIPITOR) 40 MG tablet TAKE 1/2 TABLET BY MOUTH DAILY FOR CHOLESTEROL 09/09/20   [provider]  baclofen (LIORESAL) 10 MG tablet Take 1 tablet (10 mg total) by mouth 3 (three) times daily. As needed for muscle spasm 07/14/20   Ventura Bruns, PA-C  Coenzyme Q10 (CO Q-10) 200 MG CAPS Take 200 mg by mouth daily.    [provider]  cyclobenzaprine (FLEXERIL) 10 MG tablet Take 1 tablet (10 mg total) by mouth 3 (three) times daily as needed for muscle spasms. 06/18/20   Viona Gilmore D, NP  dexamethasone (DECADRON) 4 MG tablet Take 1 tablet (4 mg total) by mouth daily. Take for 3 days after chemotherapy treatment. Take with meals. 08/11/20   Ennever,  Rose Phi, MD  dronabinol (MARINOL) 5 MG capsule Take 1 capsule (5 mg total) by mouth 2 (two) times daily before a meal. 08/11/20   Ennever, Rose Phi, MD  famciclovir (FAMVIR) 500 MG tablet Take 1 tablet (500 mg total) by mouth daily. 08/11/20   Josph Macho, MD  fentaNYL (DURAGESIC) 25 MCG/HR Place 1 patch onto the skin every 3 (three) days. 10/13/20   Josph Macho, MD  hydrOXYzine (ATARAX/VISTARIL) 10 MG tablet Take 1 tablet (10 mg total) by  mouth 3 (three) times daily as needed. 08/28/20   Josph Macho, MD  lenalidomide (REVLIMID) 25 MG capsule Take 1 capsule (25 mg total) by mouth daily. 14 days on, then 14 days off. [employer] Auth # [id number] 09/30/20   Josph Macho, MD  lisinopril (ZESTRIL) 5 MG tablet Take 7.5 mg by mouth daily. 03/10/20   [provider]  LORazepam (ATIVAN) 0.5 MG tablet Take 1 tablet (0.5 mg total) by mouth every 6 (six) hours as needed for anxiety (nausea). 07/21/20   Josph Macho, MD  metoprolol succinate (TOPROL-XL) 50 MG 24 hr tablet Take 50 mg by mouth daily. 03/10/20   [provider]  morphine (MS CONTIN) 15 MG 12 hr tablet Take 1 tablet (15 mg total) by mouth every 12 (twelve) hours. 07/21/20   Josph Macho, MD  morphine (MSIR) 15 MG tablet Take 1 tablet (15 mg total) by mouth every 6 (six) hours as needed for severe pain. 07/21/20   Josph Macho, MD  naloxone [facility]) nasal spray 4 mg/0.1 mL Use as directed on the package. 10/14/20   Josph Macho, MD  nitroGLYCERIN (NITROSTAT) 0.4 MG SL tablet Place 0.4 mg under the tongue every 5 (five) minutes as needed for chest pain.    [provider]  OLANZapine (ZYPREXA) 10 MG tablet Take 1 tablet (10 mg total) by mouth at bedtime. 08/11/20   Josph Macho, MD  omeprazole (PRILOSEC) 40 MG capsule Take by mouth. 08/06/20 08/06/21  [provider]  ondansetron (ZOFRAN) 8 MG tablet Take 1 tablet (8 mg total) by mouth every 8 (eight) hours as needed for nausea or vomiting. 07/22/20   Antony Blackbird, MD  Polyethylene Glycol 3350 (MIRALAX PO) Take 2 [place] by mouth daily.    [provider]  Polyethylene Glycol 3350 (MIRALAX PO) Take by mouth as needed.    [provider]  prasugrel (EFFIENT) 10 MG TABS tablet Take 10 mg by mouth daily. 05/12/20   [provider]  pregabalin (LYRICA) 75 MG capsule Take 1 capsule (75 mg total) by mouth 2 (two) times daily. 10/20/20   Josph Macho, MD   prochlorperazine (COMPAZINE) 10 MG tablet Take 1 tablet (10 mg total) by mouth every 6 (six) hours as needed for nausea or vomiting. 07/28/20   Antony Blackbird, MD  promethazine (PHENERGAN) 25 MG tablet Take 1 tablet (25 mg total) by mouth every 8 (eight) hours as needed for nausea or vomiting. 10/13/20   Josph Macho, MD  rivaroxaban (XARELTO) 10 MG TABS tablet Take 1 tablet (10 mg total) by mouth daily. 07/14/20   Janine Ores K, PA-C  sennosides-docusate sodium (SENOKOT-S) 8.6-50 MG tablet Take 2 tablets by mouth daily. 07/14/20   Armida Sans, PA-C  silver sulfADIAZINE (SILVADENE) 1 % cream Apply 1 application topically 2 (two) times daily. 10/06/20   Josph Macho, MD  traMADol (ULTRAM) 50 MG tablet Take 1 tablet (50 mg total) by mouth  3 (three) times daily as needed for moderate pain. 09/10/20   Gery Pray, MD  triamterene-hydrochlorothiazide (MAXZIDE) 75-50 MG tablet Take 1 tablet by mouth daily. 09/15/20   Volanda Napoleon, MD    Physical Exam: Vitals:   10/26/20 1127 10/26/20 1200 10/26/20 1321  BP: (!) 158/88 (!) 157/79 (!) 171/85  Pulse: 84 87 78  Resp: 16  18  Temp: 97.9 F (36.6 C)    TempSrc: Oral    SpO2: 96% 96% 97%    General: 69 y.o. male resting in bed in NAD Eyes: PERRL, normal sclera ENMT: Nares patent w/o discharge, orophaynx clear, dentition normal, ears w/o discharge/lesions/ulcers Neck: Supple, trachea midline Cardiovascular: RRR, +S1, S2, no m/g/r, equal pulses throughout Respiratory: CTABL, no w/r/r, normal WOB GI: BS+, NDNT, no masses noted, no organomegaly noted MSK: No e/c/c Skin: No rashes, bruises, ulcerations noted Neuro: A&O x 3, no focal deficits Psyc: Appropriate interaction and affect, calm/cooperative  Labs on Admission: I have personally reviewed following labs and imaging studies  CBC: Recent Labs  Lab 10/20/20 0915 10/26/20 1236  WBC 7.5 9.1  NEUTROABS 6.5 7.6  HGB 11.3* 12.2*  HCT 35.0* 36.7*  MCV 90.9 90.2  PLT 331 462    Basic Metabolic Panel: Recent Labs  Lab 10/20/20 0915 10/26/20 1236  NA 135 134*  K 4.4 4.3  CL 105 100  CO2 23 23  GLUCOSE 125* 125*  BUN 17 23  CREATININE 0.68 0.69  CALCIUM 8.7* 8.6*   GFR: Estimated Creatinine Clearance: 84.3 mL/min (by C-G formula based on SCr of 0.69 mg/dL). Liver Function Tests: Recent Labs  Lab 10/20/20 0915  AST 8*  ALT 10  ALKPHOS 137*  BILITOT 0.3  PROT 5.9*  ALBUMIN 3.6   No results for input(s): LIPASE, AMYLASE in the last 168 hours. No results for input(s): AMMONIA in the last 168 hours. Coagulation Profile: No results for input(s): INR, PROTIME in the last 168 hours. Cardiac Enzymes: No results for input(s): CKTOTAL, CKMB, CKMBINDEX, TROPONINI in the last 168 hours. BNP (last 3 results) No results for input(s): PROBNP in the last 8760 hours. HbA1C: No results for input(s): HGBA1C in the last 72 hours. CBG: No results for input(s): GLUCAP in the last 168 hours. Lipid Profile: No results for input(s): CHOL, HDL, LDLCALC, TRIG, CHOLHDL, LDLDIRECT in the last 72 hours. Thyroid Function Tests: No results for input(s): TSH, T4TOTAL, FREET4, T3FREE, THYROIDAB in the last 72 hours. Anemia Panel: No results for input(s): VITAMINB12, FOLATE, FERRITIN, TIBC, IRON, RETICCTPCT in the last 72 hours. Urine analysis:    Component Value Date/Time   COLORURINE YELLOW 10/26/2020 [place] 10/26/2020 1237   LABSPEC 1.017 10/26/2020 1237   PHURINE 5.0 10/26/2020 1237   GLUCOSEU NEGATIVE 10/26/2020 1237   HGBUR NEGATIVE 10/26/2020 1237   BILIRUBINUR NEGATIVE 10/26/2020 1237   KETONESUR 5 (A) 10/26/2020 1237   PROTEINUR NEGATIVE 10/26/2020 1237   NITRITE NEGATIVE 10/26/2020 1237   LEUKOCYTESUR NEGATIVE 10/26/2020 1237    Radiological Exams on Admission: DG Elbow 2 Views Right  Result Date: 10/26/2020 CLINICAL DATA:  Pain following fall EXAM: RIGHT ELBOW - 2 VIEW COMPARISON:  None. FINDINGS: Frontal and lateral views were  obtained. No fracture or dislocation. No joint effusion. No appreciable joint space narrowing. Prominent olecranon process spur noted. Small spur along the medial distal humeral condyle. IMPRESSION: No fracture or dislocation. No appreciable joint space narrowing. Prominent olecranon process spur. Small spur along the medial distal humeral condyle likely represents  focal calcific tendinosis. Electronically Signed   By: Lowella Grip III M.D.   On: 10/26/2020 12:10   CT Head Wo Contrast  Result Date: 10/26/2020 CLINICAL DATA:  TIA.  Weakness and falling. EXAM: CT HEAD WITHOUT CONTRAST TECHNIQUE: Contiguous axial images were obtained from the base of the skull through the vertex without intravenous contrast. COMPARISON:  06/26/2020 FINDINGS: Brain: The brain does not show accelerated atrophy. No evidence of old or acute focal infarction, mass lesion, hemorrhage, hydrocephalus or extra-axial collection. Vascular: There is atherosclerotic calcification of the major vessels at the base of the brain. Skull: Myeloma lytic lesions within the calvarium, unchanged since August. The largest is in the left parietal calvarium measuring about 1.5 cm in diameter. No sign of progressive lytic disease. Sinuses/Orbits: Clear/normal Other: None IMPRESSION: 1. No acute or reversible finding. Normal appearance of the brain itself. 2. Myeloma lytic lesions within the calvarium, unchanged since August. The largest is in the left parietal calvarium measuring about 1.5 cm in diameter. No sign of progressive lytic disease. Electronically Signed   By: Nelson Chimes M.D.   On: 10/26/2020 11:48   DG Hips Bilat W or Wo Pelvis 3-4 Views  Result Date: 10/26/2020 CLINICAL DATA:  Pain following fall EXAM: DG HIP (WITH OR WITHOUT PELVIS) 3-4V BILAT COMPARISON:  None. FINDINGS: Frontal pelvis as well as frontal and lateral views of each hip joint-total 5-views obtained. There is postoperative screw and nail fixation in the left femur with  alignment anatomic. Tip of screw in proximal left femur. No acute fracture or dislocation. There is mild symmetric narrowing of each hip joint. There is bony overgrowth along the superolateral right acetabulum. Sacroiliac joints appear unremarkable. IMPRESSION: Postoperative changes in the left femur. Slight narrowing of each hip joint. Bony overgrowth along the superolateral right acetabulum likely of arthropathic etiology. No acute fracture or dislocation. Electronically Signed   By: Lowella Grip III M.D.   On: 10/26/2020 12:11    Assessment/Plan Intractable b/l leg pain Falls     - place in obs     - pain control w/ diluadid     - MRI lumbar pending     - PT eval for falls     - orthostatics pending     - check CPK  Multiple Myeloma     - continue outpt follow up  HTN     - resume home meds  Anxiety     - resume home meds  CAD     - resume home meds  DVT prophylaxis: lovenox  Code Status: FULL  Family Communication: W/ wife at bedside  Consults called: None   Status is: Observation  The patient remains OBS appropriate and will d/c before 2 midnights.  Dispo: The patient is from: Home              Anticipated d/c is to: Home              Anticipated d/c date is: 1 day              Patient currently is not medically stable to d/c.  Jonnie Finner DO [facility]  If 7PM-7AM, please contact night-coverage [email or web address]  10/26/2020, 2:27 PM

## 2020-10-27 ENCOUNTER — Inpatient Hospital Stay: Payer: BC Managed Care – PPO

## 2020-10-27 ENCOUNTER — Encounter: Payer: Self-pay | Admitting: *Deleted

## 2020-10-27 ENCOUNTER — Observation Stay (HOSPITAL_BASED_OUTPATIENT_CLINIC_OR_DEPARTMENT_OTHER)
Admission: EM | Admit: 2020-10-27 | Discharge: 2020-10-27 | Disposition: A | Discharge status: Home / Self Care | Payer: BC Managed Care – PPO | Source: Home / Self Care | Attending: Internal Medicine | Admitting: Internal Medicine

## 2020-10-27 DIAGNOSIS — M7989 Other specified soft tissue disorders: Secondary | ICD-10-CM | POA: Diagnosis not present

## 2020-10-27 DIAGNOSIS — C419 Malignant neoplasm of bone and articular cartilage, unspecified: Secondary | ICD-10-CM

## 2020-10-27 DIAGNOSIS — M79604 Pain in right leg: Secondary | ICD-10-CM | POA: Diagnosis not present

## 2020-10-27 DIAGNOSIS — M79606 Pain in leg, unspecified: Secondary | ICD-10-CM | POA: Diagnosis not present

## 2020-10-27 DIAGNOSIS — Z87311 Personal history of (healed) other pathological fracture: Secondary | ICD-10-CM

## 2020-10-27 DIAGNOSIS — Z9225 Personal history of immunosupression therapy: Secondary | ICD-10-CM

## 2020-10-27 DIAGNOSIS — M79605 Pain in left leg: Secondary | ICD-10-CM | POA: Diagnosis not present

## 2020-10-27 DIAGNOSIS — Z923 Personal history of irradiation: Secondary | ICD-10-CM

## 2020-10-27 DIAGNOSIS — C9000 Multiple myeloma not having achieved remission: Secondary | ICD-10-CM

## 2020-10-27 DIAGNOSIS — R630 Anorexia: Secondary | ICD-10-CM

## 2020-10-27 DIAGNOSIS — R531 Weakness: Secondary | ICD-10-CM

## 2020-10-27 DIAGNOSIS — Z9221 Personal history of antineoplastic chemotherapy: Secondary | ICD-10-CM

## 2020-10-27 LAB — COMPREHENSIVE METABOLIC PANEL
ALT: 16 U/L (ref 0–44)
AST: 11 U/L — ABNORMAL LOW (ref 15–41)
Albumin: 3.3 g/dL — ABNORMAL LOW (ref 3.5–5.0)
Alkaline Phosphatase: 122 U/L (ref 38–126)
Anion gap: 8 (ref 5–15)
BUN: 16 mg/dL (ref 8–23)
CO2: 24 mmol/L (ref 22–32)
Calcium: 8.3 mg/dL — ABNORMAL LOW (ref 8.9–10.3)
Chloride: 104 mmol/L (ref 98–111)
Creatinine, Ser: 0.65 mg/dL (ref 0.61–1.24)
GFR, Estimated: >60 mL/min (ref >60)
Glucose, Bld: 107 mg/dL — ABNORMAL HIGH (ref 70–99)
Potassium: 3.9 mmol/L (ref 3.5–5.1)
Sodium: 136 mmol/L (ref 135–145)
Total Bilirubin: 0.8 mg/dL (ref 0.3–1.2)
Total Protein: 5.5 g/dL — ABNORMAL LOW (ref 6.5–8.1)

## 2020-10-27 LAB — CBC
HCT: 34 % — ABNORMAL LOW (ref 39.0–52.0)
Hemoglobin: 11.3 g/dL — ABNORMAL LOW (ref 13.0–17.0)
MCH: 30.3 pg (ref 26.0–34.0)
MCHC: 33.2 g/dL (ref 30.0–36.0)
MCV: 91.2 fL (ref 80.0–100.0)
Platelets: 196 10*3/uL (ref 150–400)
RBC: 3.73 MIL/uL — ABNORMAL LOW (ref 4.22–5.81)
RDW: 16.3 % — ABNORMAL HIGH (ref 11.5–15.5)
WBC: 5 10*3/uL (ref 4.0–10.5)
nRBC: 0 % (ref 0.0–0.2)

## 2020-10-27 MED ORDER — KETOROLAC TROMETHAMINE 15 MG/ML IJ SOLN
30.0000 mg | Freq: Three times a day (TID) | INTRAMUSCULAR | Status: DC
  Administered 2020-10-27 (×2): 30 mg via INTRAVENOUS
  Filled 2020-10-27 (×2): qty 2

## 2020-10-27 MED ORDER — PANTOPRAZOLE SODIUM 40 MG PO TBEC
40.0000 mg | DELAYED_RELEASE_TABLET | Freq: Two times a day (BID) | ORAL | Status: DC
  Administered 2020-10-27: 40 mg via ORAL
  Filled 2020-10-27: qty 1

## 2020-10-27 MED ORDER — PREGABALIN 100 MG PO CAPS: 100.0000 mg | ORAL_CAPSULE | Freq: Two times a day (BID) | ORAL | 0 refills | Status: DC

## 2020-10-27 MED ORDER — SODIUM CHLORIDE 0.9 % IV SOLN
40.0000 mg | Freq: Once | INTRAVENOUS | Status: AC
  Administered 2020-10-27: 40 mg via INTRAVENOUS
  Filled 2020-10-27: qty 4

## 2020-10-27 MED ORDER — DEXAMETHASONE SODIUM PHOSPHATE 4 MG/ML IJ SOLN: 40.0000 mg | INTRAMUSCULAR | Status: DC

## 2020-10-27 MED ORDER — PREGABALIN 100 MG PO CAPS: 100.0000 mg | ORAL_CAPSULE | Freq: Two times a day (BID) | ORAL | Status: DC

## 2020-10-27 NOTE — Progress Notes (Signed)
Left lower extremity venous duplex has been completed. Preliminary results can be found in CV Proc through chart review.   10/27/20 9:32 AM Olen Cordial RVT

## 2020-10-27 NOTE — Evaluation (Signed)
Physical Therapy Evaluation Patient Details Name: Bruce Smith MRN: [id number] DOB: Nov 19, 1950 Today's Date: 10/27/2020   History of Present Illness  69 y.o. male with medical history significant of multiple myeloma, HTN, HLD. Presenting with worsening leg pain and falls  Clinical Impression  Patient evaluated by Physical Therapy with no further acute PT needs identified. All education has been completed and the patient has no further questions.  Pt wife to get regular RW for home use, advised to use RW rather than rollator so he can keep support around him, use UEs to assist self if he experiences L knee buckling. dtr present and verbalizes good understanding. Reviewed LAQs and graded sit to stands for beginning HEP. Pt should have hands on assist (has gait belt provided by hospital) for mobility until HHPT deems otherwise.   See below for any follow-up Physical Therapy or equipment needs. PT is signing off. Thank you for this referral.     Follow Up Recommendations Home health PT;Supervision for mobility/OOB (hands on assist for amb)    Equipment Recommendations   (wife to get RW)    Recommendations for Other Services       Precautions / Restrictions Precautions Precautions: Fall Precaution Comments: reports 2 falls within past few days Restrictions Weight Bearing Restrictions: No      Mobility  Bed Mobility Overal bed mobility: Modified Independent                  Transfers Overall transfer level: Needs assistance Equipment used: Rolling walker (2 wheeled) Transfers: Sit to/from Stand Sit to Stand: Min assist         General transfer comment: cues for safety and hand placement  Ambulation/Gait Ambulation/Gait assistance: Min assist Gait Distance (Feet): 50 Feet Assistive device: Rolling walker (2 wheeled) Gait Pattern/deviations: Step-to pattern;Step-through pattern     General Gait Details: L knee with repeated buckling, assist to prevent fall.  encouraged pt to perform step to gait for safety and to prevent buckling. pt continued to amb with step through gait  Stairs            Wheelchair Mobility    Modified Rankin (Stroke Patients Only)       Balance Overall balance assessment: Needs assistance;History of Falls Sitting-balance support: Feet supported Sitting balance-Leahy Scale: Good     Standing balance support: Bilateral upper extremity supported Standing balance-Leahy Scale: Poor Standing balance comment: reliant on UEs and external assist for dynamic tasks                             Pertinent Vitals/Pain Pain Assessment: No/denies pain Pain Score: 7  Pain Location: lower back, legs Pain Descriptors / Indicators: Sore Pain Intervention(s): Monitored during session;Premedicated before session    Home Living Family/patient expects to be discharged to:: Private residence Living Arrangements: Spouse/significant other Available Help at Discharge: Family;Available 24 hours/day Type of Home: House Home Access: Stairs to enter Entrance Stairs-Rails:  (one rail) Technical brewer of Steps: 2 Home Layout: Two level;Able to live on main level with bedroom/bathroom Home Equipment: Shower seat - built in;Hand held Tourist information centre manager - 4 wheels Additional Comments: Has an adjustable bed    Prior Function Level of Independence: Independent with assistive device(s)         Comments: amb with rollator     Hand Dominance   Dominant Hand: Right    Extremity/Trunk Assessment   Upper Extremity Assessment Upper Extremity Assessment: Overall WFL for  tasks assessed    Lower Extremity Assessment Lower Extremity Assessment: LLE deficits/detail LLE Deficits / Details: ankle grossly WFL, knee ~ 3+/5, hip ~4/5    Cervical / Trunk Assessment Cervical / Trunk Assessment: Normal  Communication   Communication: No difficulties  Cognition Arousal/Alertness: Awake/alert Behavior During Therapy:  WFL for tasks assessed/performed Overall Cognitive Status: Within Functional Limits for tasks assessed                                        General Comments      Exercises     Assessment/Plan    PT Assessment All further PT needs can be met in the next venue of care  PT Problem List         PT Treatment Interventions      PT Goals (Current goals can be found in the Care Plan section)  Acute Rehab PT Goals Patient Stated Goal: home today PT Goal Formulation: All assessment and education complete, DC therapy    Frequency     Barriers to discharge        Co-evaluation               AM-PAC PT "6 Clicks" Mobility  Outcome Measure Help needed turning from your back to your side while in a flat bed without using bedrails?: None Help needed moving from lying on your back to sitting on the side of a flat bed without using bedrails?: None Help needed moving to and from a bed to a chair (including a wheelchair)?: A Little Help needed standing up from a chair using your arms (e.g., wheelchair or bedside chair)?: A Little Help needed to walk in hospital room?: A Little Help needed climbing 3-5 steps with a railing? : A Lot 6 Click Score: 19    End of Session Equipment Utilized During Treatment: Gait belt Activity Tolerance: Patient tolerated treatment well Patient left: in bed;with call bell/phone within reach;with family/visitor present Nurse Communication: Mobility status PT Visit Diagnosis: Other abnormalities of gait and mobility (R26.89);Muscle weakness (generalized) (M62.81)    Time: 1884-1660 PT Time Calculation (min) (ACUTE ONLY): 16 min   Charges:   PT Evaluation $PT Eval Low Complexity: [place], PT  Acute Rehab Dept ([facility]) [phone] Pager [phone]  10/27/2020   [facility] 10/27/2020, 3:13 PM

## 2020-10-27 NOTE — Plan of Care (Signed)
Patient discharged home in stable condition 

## 2020-10-27 NOTE — Discharge Instructions (Signed)
Deconditioning °Deconditioning refers to the changes in the body that occur during a period of inactivity. The changes happen in the heart, lungs, and muscles. They make you feel tired and weak (fatigued) and decrease your ability to be active. The three stages of deconditioning include: °· Mild deconditioning. This is a change in your ability to do your usual exercise activities, such as running, biking, or swimming. °· Moderate deconditioning. This is a change in your ability to do normal everyday activities, such as walking, shopping for groceries, and doing chores. °· Severe deconditioning. In this stage, you may not be able to do minimal activity or usual self-care. °What are the causes? °Deconditioning can occur after only a few days of inactivity. The longer the period of inactivity, the more severe the deconditioning will be, and the longer it will take to return to your previous level of functioning. Deconditioning is often caused by inactivity due to: °· Illnesses, such as cancer, stroke, heart attack, fibromyalgia, and chronic fatigue syndrome. °· Injuries, especially back injuries, broken bones, and injuries to soft tissues, such as ligaments and tendons. °· A long stay in the hospital. °· Pregnancy, especially if long periods of bed rest are needed. °What increases the risk? °The following factors may make you more likely to develop this condition: °· Staying in the hospital or being on bed rest. °· Obesity. °· Poor nutrition. °· Being an older adult. °· Having an injury or illness that affects your movement and activity. °What are the signs or symptoms? °Symptoms of this condition include: °· Weakness and tiredness. °· Shortness of breath with minor physical effort (exertion). °· A heartbeat that is faster than normal. You may not notice this without taking your pulse. °· Pain or discomfort with activity. °· Decreased strength, endurance, and balance. °· Difficulty doing your usual forms of  exercise. °· Difficulty doing activities of daily living, such as grocery shopping or chores. You may also have problems walking around the house and doing basic self-care, such as getting to the bathroom, preparing meals, or doing laundry. °How is this diagnosed? °This condition is diagnosed based on your medical history and a physical exam. During the physical exam, your health care provider will check for signs of deconditioning, such as: °· Decreased size of muscles. °· Decreased strength. °· Trouble with balance. °· Shortness of breath or a heart rate that is faster than normal after minor exertion. °How is this treated? °Treatment for this condition involves an exercise program in which activity is increased slowly. Your health care provider will tell you which exercises are right for you. The exercise program will likely include: °· Aerobic exercise. This type of exercise helps improve the functioning of the heart, lungs, and muscles. °· Strength training. This type of exercise helps increase muscle size and strength. °Both of these types of exercise will improve your endurance. You may be referred to a physical therapist who can create a safe strengthening program for you to follow. °Follow these instructions at home: °Eating and drinking ° °· Eat a healthy, well-balanced diet. This includes: °? Proteins, such as lean meats and fish, to build muscles. °? Fresh fruits and vegetables. °? Carbohydrates, such as whole grains, to boost energy. °· Drink enough fluid to keep your urine pale yellow. °Activity ° °· Follow the exercise program that is recommended by your health care provider or physical therapist. °· Do not increase your exercise any faster than directed. °General instructions °· Take over-the-counter and prescription medicines only   as told by your health care provider. °· Do not use any products that contain nicotine or tobacco, such as cigarettes, e-cigarettes, and chewing tobacco. If you need help  quitting, ask your health care provider. °· Keep all follow-up visits as told by your health care provider. This is important. °Contact a health care provider if: °· You are not able to do the recommended exercise program. °· You are becoming more and more tired and weak. °· You become light-headed when rising to a sitting or standing position. °· Your level of endurance decreases after it has improved. °Get help right away if you: °· Have chest pain. °· Are very short of breath. °· Have any episodes of fainting. °Summary °· Deconditioning refers to the changes in the body that occur during a period of inactivity. °· Deconditioning happens in the heart, lungs, and muscles. The changes make you feel tired and weak and decrease your ability to be active. °· Treatment for deconditioning involves an exercise program in which activity is increased slowly. °This information is not intended to replace advice given to you by your health care provider. Make sure you discuss any questions you have with your health care provider. °Document Revised: 03/14/2019 Document Reviewed: 03/14/2019 °Elsevier Patient Education © 2020 [employer] ° °

## 2020-10-27 NOTE — Plan of Care (Signed)
Plan of care reviewed and discussed with the patient. 

## 2020-10-27 NOTE — Discharge Summary (Signed)
[facility]  Physician Discharge Summary   Patient ID: Bruce Smith MRN: [id number] DOB/AGE: Jan 25, 1951 69 y.o.  Admit date: 10/26/2020 Discharge date: 10/27/2020  PCP: Derrill Center., MD  DISCHARGE DIAGNOSES:  Lower extremity pain, possible neuropathy Recent falls History of multiple myeloma Essential hypertension  RECOMMENDATIONS FOR OUTPATIENT FOLLOW UP: 1. Follow-up with medical oncology within the next few days.    Home Health:TBD  Equipment/Devices: None   CODE STATUS: Full code  DISCHARGE CONDITION: fair  Diet recommendation: As before  INITIAL HISTORY: 69 y.o. male with medical history significant of multiple myeloma, HTN, HLD. Presenting with worsening leg pain and falls. He has a history of chronic bilateral leg pain presumably secondary to his MM. He reports that he has felt worsening pain on the left over the last several days and it has led to a couple of falls. This morning he woke with more leg pain and another fall. He felt it was time to come to the ED. He remembers all his falls. There are no head injuries reported. He has never lost consciousness. He reports that he has known lytic lesions to his bones including his left pelvis. His pain is noted to be throbbing and constant.   ED Course: labwork was unremarkable. MRI ordered. [facility] was called for admission.   Consultations:  Dr. [name]  Procedures:  None  HOSPITAL COURSE:   Bilateral lower extremity pain that falls No obvious deformity noted in the lower extremities.  He has full range of motion.  He does have some tenderness in the lower posterior thigh and upper calf area bilaterally.  Lower extremity Doppler studies negative for DVT.  MRI of the lumbar spine as well as of the pelvis does not show any acute findings.  Multiple lesions consistent with myeloma were noted.  No spinal involvement.  Patient to be seen by PT and OT.  CK level was not elevated.  Should be able to go home later  today once cleared by PT and OT.  History of multiple myeloma Currently being managed by Dr. [name].  Discussed with him and he has cleared patient for discharge as long as PT and OT has cleared him.  Outpatient follow-up with Dr. [name].  Discussed with his wife as well.  She is concerned about his poor oral intake.  She was told that he should take nutritional supplements.  And to discuss this issue further with Dr. [name].  Appetite stimulating agents may be considered at follow-up.  Other medical issues have been stable.  Labs from this morning reviewed and noted to be stable.  Okay for discharge home today once cleared by PT and OT.    PERTINENT LABS:  The results of significant diagnostics from this hospitalization (including imaging, microbiology, ancillary and laboratory) are listed below for reference.    Microbiology: Recent Results (from the past 240 hour(s))  SARS CORONAVIRUS 2 (TAT 6-24 HRS) Nasopharyngeal Nasopharyngeal Swab     Status: None   Collection Time: 10/26/20  2:45 PM   Specimen: Nasopharyngeal Swab  Result Value Ref Range Status   SARS Coronavirus 2 NEGATIVE NEGATIVE Final    Comment: (NOTE) SARS-CoV-2 target nucleic acids are NOT DETECTED.  The SARS-CoV-2 RNA is generally detectable in upper and lower respiratory specimens during the acute phase of infection. Negative results do not preclude SARS-CoV-2 infection, do not rule out co-infections with other pathogens, and should not be used as the sole basis for treatment or other patient management decisions. Negative results must  be combined with clinical observations, patient history, and epidemiological information. The expected result is Negative.  Fact Sheet for Patients: https://www.fda.gov/media/138098/download  Fact Sheet for Healthcare Providers: https://www.fda.gov/media/138095/download  This test is not yet approved or cleared by the [place] FDA and  has been authorized for detection  and/or diagnosis of SARS-CoV-2 by FDA under an Emergency Use Authorization (EUA). This EUA will remain  in effect (meaning this test can be used) for the duration of the COVID-19 declaration under Se ction 564(b)(1) of the Act, 21 U.S.C. section 360bbb-3(b)(1), unless the authorization is terminated or revoked sooner.  Performed at [facility], [address] [place], [place] [address]      Labs:  COVID-19 Labs   Lab Results  Component Value Date   SARSCOV2NAA NEGATIVE 10/26/2020   SARSCOV2NAA NEGATIVE 07/10/2020   SARSCOV2NAA NEGATIVE 06/15/2020      Basic Metabolic Panel: Recent Labs  Lab 10/26/20 1236 10/27/20 0258  NA 134* 136  K 4.3 3.9  CL 100 104  CO2 23 24  GLUCOSE 125* 107*  BUN 23 16  CREATININE 0.69 0.65  CALCIUM 8.6* 8.3*   Liver Function Tests: Recent Labs  Lab 10/27/20 0258  AST 11*  ALT 16  ALKPHOS 122  BILITOT 0.8  PROT 5.5*  ALBUMIN 3.3*   CBC: Recent Labs  Lab 10/26/20 1236 10/27/20 0258  WBC 9.1 5.0  NEUTROABS 7.6  --   HGB 12.2* 11.3*  HCT 36.7* 34.0*  MCV 90.2 91.2  PLT 214 196   Cardiac Enzymes: Recent Labs  Lab 10/26/20 1236  CKTOTAL 27*     IMAGING STUDIES DG Elbow 2 Views Right  Result Date: 10/26/2020 CLINICAL DATA:  Pain following fall EXAM: RIGHT ELBOW - 2 VIEW COMPARISON:  None. FINDINGS: Frontal and lateral views were obtained. No fracture or dislocation. No joint effusion. No appreciable joint space narrowing. Prominent olecranon process spur noted. Small spur along the medial distal humeral condyle. IMPRESSION: No fracture or dislocation. No appreciable joint space narrowing. Prominent olecranon process spur. Small spur along the medial distal humeral condyle likely represents focal calcific tendinosis. Electronically Signed   By: William  Woodruff III M.D.   On: 10/26/2020 12:10   CT Head Wo Contrast  Result Date: 10/26/2020 CLINICAL DATA:  TIA.  Weakness and falling. EXAM: CT HEAD WITHOUT  CONTRAST TECHNIQUE: Contiguous axial images were obtained from the base of the skull through the vertex without intravenous contrast. COMPARISON:  06/26/2020 FINDINGS: Brain: The brain does not show accelerated atrophy. No evidence of old or acute focal infarction, mass lesion, hemorrhage, hydrocephalus or extra-axial collection. Vascular: There is atherosclerotic calcification of the major vessels at the base of the brain. Skull: Myeloma lytic lesions within the calvarium, unchanged since August. The largest is in the left parietal calvarium measuring about 1.5 cm in diameter. No sign of progressive lytic disease. Sinuses/Orbits: Clear/normal Other: None IMPRESSION: 1. No acute or reversible finding. Normal appearance of the brain itself. 2. Myeloma lytic lesions within the calvarium, unchanged since August. The largest is in the left parietal calvarium measuring about 1.5 cm in diameter. No sign of progressive lytic disease. Electronically Signed   By: Mark  Shogry M.D.   On: 10/26/2020 11:48   MR Lumbar Spine W Wo Contrast  Result Date: 10/26/2020 CLINICAL DATA:  Multiple myeloma, increased weakness and falls EXAM: MRI LUMBAR SPINE WITHOUT AND WITH CONTRAST TECHNIQUE: Multiplanar and multiecho pulse sequences of the lumbar spine were obtained without and with intravenous contrast. CONTRAST:    8mL GADAVIST GADOBUTROL 1 MMOL/ML IV SOLN COMPARISON:  None. FINDINGS: Motion artifact is present. Segmentation:  Standard. Alignment:  No significant anteroposterior listhesis. Vertebrae: Multifocal STIR hyperintensity and enhancement throughout the visualized osseous structures including the lumbar spine. There is compression deformity of L2 with loss of height at the inferior endplate measuring less than 50%. Milder loss of height is present at the inferior endplate of L1. No significant epidural disease. Conus medullaris and cauda equina: Conus extends to the L1-L2 level. Conus and cauda equina appear normal. There  is a fatty filum terminale. No definite intrathecal enhancement with limitation of motion artifact. Paraspinal and other soft tissues: Unremarkable. Disc levels: L1-L2:  No canal or foraminal stenosis. L2-L3: Disc bulge with superimposed left foraminal protrusion. Facet arthropathy with ligamentum flavum infolding. Mild to moderate canal stenosis with partial effacement of the subarticular recesses. Minor right foraminal stenosis. Moderate left foraminal stenosis. L3-L4: Disc bulge. Facet arthropathy with ligamentum flavum infolding. Mild canal stenosis. Mild to moderate right foraminal stenosis. Minor left foraminal stenosis. L4-L5: Disc bulge with endplate osteophytic ridging eccentric to the right. Facet arthropathy with ligamentum flavum infolding. No canal stenosis. Partial effacement of the right subarticular recess. Mild right foraminal stenosis. Minor left foraminal stenosis. L5-S1:  Facet arthropathy.  No canal or foraminal stenosis. IMPRESSION: Lesions throughout the included osseous structures reflecting multiple myeloma. No significant epidural disease. No acute compression deformity. Multilevel degenerative changes as detailed above. Electronically Signed   By: Praneil  Patel M.D.   On: 10/26/2020 17:56   MR PELVIS W WO CONTRAST  Result Date: 10/26/2020 CLINICAL DATA:  History of multiple myeloma, patient had a recent fall EXAM: MRI PELVIS WITHOUT AND WITH CONTRAST TECHNIQUE: Multiplanar multisequence MR imaging of the pelvis was performed both before and after administration of intravenous contrast. CONTRAST:  8mL GADAVIST GADOBUTROL 1 MMOL/ML IV SOLN COMPARISON:  MRI lumbar spine October 26, 2020 and head CT on October 16, 2020 FINDINGS: Urinary Tract: The visualized distal ureters and bladder appear unremarkable. Bowel: No bowel wall thickening, distention or surrounding inflammation identified within the pelvis. Vascular/Lymphatic: No enlarged pelvic lymph nodes identified. No significant  vascular findings. Reproductive: The prostate gland is grossly unremarkable. Other: A small amount of presacral edema and trace free fluid seen within the deep pelvis. Musculoskeletal: Multiple heterogeneously enhancing T2 bright/T1 dark osseous blastic lesions are seen throughout the visualized portion of the lumbar spine and pelvis as on the recent PET CT. The patient is status post left ORIF with IM nail fixation. There is a large lytic lesion within the left proximal femur and femoral neck with healing fracture. No acute fracture is seen. The sacroiliac joints are intact. No large hip joint effusions are seen. IMPRESSION: Multiple lytic lesions throughout the lumbar spine and pelvis consistent with the patient's no are myomatous lesions. No significant change from the prior exam. No acute fracture or malalignment. Small amount of presacral edema and free fluid in the deep pelvis. Electronically Signed   By: Bindu  Avutu M.D.   On: 10/26/2020 18:58    DG Hips Bilat W or Wo Pelvis 3-4 Views  Result Date: 10/26/2020 CLINICAL DATA:  Pain following fall EXAM: DG HIP (WITH OR WITHOUT PELVIS) 3-4V BILAT COMPARISON:  None. FINDINGS: Frontal pelvis as well as frontal and lateral views of each hip joint-total 5-views obtained. There is postoperative screw and nail fixation in the left femur with alignment anatomic. Tip of screw in proximal left femur. No acute fracture or dislocation. There is mild symmetric   narrowing of each hip joint. There is bony overgrowth along the superolateral right acetabulum. Sacroiliac joints appear unremarkable. IMPRESSION: Postoperative changes in the left femur. Slight narrowing of each hip joint. Bony overgrowth along the superolateral right acetabulum likely of arthropathic etiology. No acute fracture or dislocation. Electronically Signed   By: William  Woodruff III M.D.   On: 10/26/2020 12:11   VAS US LOWER EXTREMITY VENOUS (DVT)  Result Date: 10/27/2020  Lower Venous DVT Study  Indications: Swelling.  Risk Factors: Cancer. Comparison Study: No prior studies. Performing Technologist: Gregory Collins RVT  Examination Guidelines: A complete evaluation includes B-mode imaging, spectral Doppler, color Doppler, and power Doppler as needed of all accessible portions of each vessel. Bilateral testing is considered an integral part of a complete examination. Limited examinations for reoccurring indications may be performed as noted. The reflux portion of the exam is performed with the patient in reverse Trendelenburg.  +-----+---------------+---------+-----------+----------+--------------+ RIGHTCompressibilityPhasicitySpontaneityPropertiesThrombus Aging +-----+---------------+---------+-----------+----------+--------------+ CFV  Full           Yes      Yes                                 +-----+---------------+---------+-----------+----------+--------------+   +---------+---------------+---------+-----------+----------+--------------+ LEFT     CompressibilityPhasicitySpontaneityPropertiesThrombus Aging +---------+---------------+---------+-----------+----------+--------------+ CFV      Full           Yes      Yes                                 +---------+---------------+---------+-----------+----------+--------------+ SFJ      Full                                                        +---------+---------------+---------+-----------+----------+--------------+ FV Prox  Full                                                        +---------+---------------+---------+-----------+----------+--------------+ FV Mid   Full                                                        +---------+---------------+---------+-----------+----------+--------------+ FV DistalFull                                                        +---------+---------------+---------+-----------+----------+--------------+ PFV      Full                                                         +---------+---------------+---------+-----------+----------+--------------+ POP      Full             Yes      Yes                                 +---------+---------------+---------+-----------+----------+--------------+ PTV      Full                                                        +---------+---------------+---------+-----------+----------+--------------+ PERO     Full                                                        +---------+---------------+---------+-----------+----------+--------------+     Summary: RIGHT: - No evidence of common femoral vein obstruction.  LEFT: - There is no evidence of deep vein thrombosis in the lower extremity.  - No cystic structure found in the popliteal fossa.  *See table(s) above for measurements and observations.    Preliminary     DISCHARGE EXAMINATION: Vitals:   10/26/20 1835 10/26/20 2214 10/27/20 0146 10/27/20 0603  BP:  127/70 134/79 (!) 141/80  Pulse:  84 85 75  Resp:  16 16 16  Temp:  99.3 F (37.4 C) 98.7 F (37.1 C) 98.3 F (36.8 C)  TempSrc:  Oral Oral Oral  SpO2:  98% 97% 97%  Weight: 77.6 kg     Height: 5' 8" (1.727 m)      General appearance: Awake alert.  In no distress Resp: Clear to auscultation bilaterally.  Normal effort Cardio: S1-S2 is normal regular.  No S3-S4.  No rubs murmurs or bruit GI: Abdomen is soft.  Nontender nondistended.  Bowel sounds are present normal.  No masses organomegaly Extremities: No edema.  Full range of motion of lower extremities.  Mildly tender in the posterior thigh and upper calf area.  No lesions noted.  No erythema.  Straight leg raising test was negative. Neurologic: No focal neurological deficits.    DISPOSITION: Home  Discharge Instructions    Call MD for:  difficulty breathing, headache or visual disturbances   Complete by: As directed    Call MD for:  extreme fatigue   Complete by: As directed    Call MD for:  persistant dizziness or  light-headedness   Complete by: As directed    Call MD for:  persistant nausea and vomiting   Complete by: As directed    Call MD for:  severe uncontrolled pain   Complete by: As directed    Call MD for:  temperature >100.4   Complete by: As directed    Diet - low sodium heart healthy   Complete by: As directed    Discharge instructions   Complete by: As directed    Please be sure to call your oncologist's office to schedule outpatient appointment.  Take your medications as prescribed.  You were cared for by a hospitalist during your hospital stay. If you have any questions about your discharge medications or the care you received while you were in the hospital after you are discharged, you can call the unit and asked to speak with the hospitalist on call if the hospitalist that took care of you is not   available. Once you are discharged, your primary care physician will handle any further medical issues. Please note that NO REFILLS for any discharge medications will be authorized once you are discharged, as it is imperative that you return to your primary care physician (or establish a relationship with a primary care physician if you do not have one) for your aftercare needs so that they can reassess your need for medications and monitor your lab values. If you do not have a primary care physician, you can call [phone] for a physician referral.   Increase activity slowly   Complete by: As directed          Allergies as of 10/27/2020   No Known Allergies     Medication List    STOP taking these medications   baclofen 10 MG tablet Commonly known as: LIORESAL   cyclobenzaprine 10 MG tablet Commonly known as: FLEXERIL   dronabinol 5 MG capsule Commonly known as: MARINOL   hydrOXYzine 10 MG tablet Commonly known as: ATARAX/VISTARIL   lenalidomide 25 MG capsule Commonly known as: REVLIMID   promethazine 25 MG tablet Commonly known as: PHENERGAN   rivaroxaban 10 MG Tabs  tablet Commonly known as: Xarelto     TAKE these medications   acetaminophen 500 MG tablet Commonly known as: TYLENOL Take 1,000 mg by mouth every 6 (six) hours.   aspirin 81 MG EC tablet Take 81 mg by mouth daily. Swallow whole.   atorvastatin 40 MG tablet Commonly known as: LIPITOR TAKE 1/2 TABLET BY MOUTH DAILY FOR CHOLESTEROL   Co Q-10 200 MG Caps Take 200 mg by mouth daily.   dexamethasone 4 MG tablet Commonly known as: DECADRON Take 1 tablet (4 mg total) by mouth daily. Take for 3 days after chemotherapy treatment. Take with meals.   famciclovir 500 MG tablet Commonly known as: FAMVIR Take 1 tablet (500 mg total) by mouth daily.   fentaNYL 25 MCG/HR Commonly known as: DURAGESIC Place 1 patch onto the skin every 3 (three) days.   LORazepam 0.5 MG tablet Commonly known as: ATIVAN Take 1 tablet (0.5 mg total) by mouth every 6 (six) hours as needed for anxiety (nausea).   metoprolol succinate 50 MG 24 hr tablet Commonly known as: TOPROL-XL Take 50 mg by mouth daily.   MIRALAX PO Take 2 Scoops by mouth daily.   morphine 15 MG tablet Commonly known as: MSIR Take 1 tablet (15 mg total) by mouth every 6 (six) hours as needed for severe pain. What changed: Another medication with the same name was removed. Continue taking this medication, and follow the directions you see here.   naloxone 4 MG/0.1ML Liqd nasal spray kit Commonly known as: NARCAN Use as directed on the package.   nitroGLYCERIN 0.4 MG SL tablet Commonly known as: NITROSTAT Place 0.4 mg under the tongue every 5 (five) minutes as needed for chest pain.   OLANZapine 10 MG tablet Commonly known as: ZYPREXA Take 1 tablet (10 mg total) by mouth at bedtime.   omeprazole 40 MG capsule Commonly known as: PRILOSEC Take 40 mg by mouth daily.   ondansetron 8 MG tablet Commonly known as: Zofran Take 1 tablet (8 mg total) by mouth every 8 (eight) hours as needed for nausea or vomiting.   prasugrel 10  MG Tabs tablet Commonly known as: EFFIENT Take 10 mg by mouth daily.   pregabalin 100 MG capsule Commonly known as: LYRICA Take 1 capsule (100 mg total) by mouth 2 (two) times daily. What   changed:   medication strength  how much to take   prochlorperazine 10 MG tablet Commonly known as: COMPAZINE Take 1 tablet (10 mg total) by mouth every 6 (six) hours as needed for nausea or vomiting.   sennosides-docusate sodium 8.6-50 MG tablet Commonly known as: SENOKOT-S Take 2 tablets by mouth daily. What changed:   when to take this  reasons to take this   silver sulfADIAZINE 1 % cream Commonly known as: Silvadene Apply 1 application topically 2 (two) times daily.   traMADol 50 MG tablet Commonly known as: ULTRAM Take 1 tablet (50 mg total) by mouth 3 (three) times daily as needed for moderate pain.   triamterene-hydrochlorothiazide 75-50 MG tablet Commonly known as: Maxzide Take 1 tablet by mouth daily.         Follow-up Information    Ennever, Peter R, MD. Schedule an appointment as soon as possible for a visit.   Specialty: Oncology Contact information: [address] [place] [address] [phone]               TOTAL DISCHARGE TIME: 35 minutes     [facility] Pager on [email or web address]  10/27/2020, 12:34 PM     

## 2020-10-27 NOTE — Evaluation (Signed)
Occupational Therapy Evaluation Patient Details Name: Bruce Smith MRN: [id number] DOB: 01-Dec-1950 Today's Date: 10/27/2020    History of Present Illness 69 y.o. male with medical history significant of multiple myeloma, HTN, HLD. Presenting with worsening leg pain and falls   Clinical Impression   Patient lives at home with spouse in two level home, stays first level. Patient is typically mod I with ADLs and ambulation with rollator however has had 2 falls in past few days due to acute onset of increased LE pain and weakness. Currently patient min A for safety with ambulation/mobility due to x1 L LE buckling in standing. Educated patient, spouse (via phone) and pt's DTR on fall prevention strategies to implement at home, verbalized understanding. Recommend continued acute OT services to maximize patient safety and independence with self care.    Follow Up Recommendations  No OT follow up;Supervision/Assistance - 24 hour    Equipment Recommendations  None recommended by OT       Precautions / Restrictions Precautions Precautions: Fall Precaution Comments: reports 2 falls within past few days Restrictions Weight Bearing Restrictions: No      Mobility Bed Mobility Overal bed mobility: Modified Independent                  Transfers Overall transfer level: Needs assistance Equipment used: Rolling walker (2 wheeled) Transfers: Sit to/from Stand Sit to Stand: Min assist         General transfer comment: please see toilet transfer in ADL section    Balance Overall balance assessment: Needs assistance;History of Falls Sitting-balance support: Feet supported Sitting balance-Leahy Scale: Good     Standing balance support: Bilateral upper extremity supported Standing balance-Leahy Scale: Poor Standing balance comment: reliant on external support                           ADL either performed or assessed with clinical judgement   ADL Overall ADL's : Needs  assistance/impaired     Grooming: Standing;Minimal assistance Grooming Details (indicate cue type and reason): patient with x1 episode of L LE buckling in standing at sink requiring min A assist for safety Upper Body Bathing: Set up;Sitting   Lower Body Bathing: Minimal assistance;Sit to/from stand   Upper Body Dressing : Set up;Sitting   Lower Body Dressing: Set up;Minimal assistance;Sitting/lateral leans;Sit to/from stand Lower Body Dressing Details (indicate cue type and reason): seated patient is able to don shoes, min A in standing for safety due to LE buckling Toilet Transfer: Minimal assistance;Ambulation;RW Toilet Transfer Details (indicate cue type and reason): min A for safety as patient's L LE buckled in standing at sink and recent falls Toileting- Clothing Manipulation and Hygiene: Minimal assistance;Sit to/from stand       Functional mobility during ADLs: Minimal assistance;Rolling walker General ADL Comments: educated patient, spouse (via phone) and DTR on fall prevention strategies for home. Provided them with gait belt and educated on how to implement as well as utilizing urinal at EOB at night time. Educate that especially when patient having increased pain to have someone with him during ambulation + utilize gait belt that way they can assist with positioning rollator for patient to sit. patient states that due to acute onset of increased pain in LE has noticed more weakness/LE buckling and has been unable to position rollator in time to sit                  Pertinent Vitals/Pain Pain Assessment: 0-10  Pain Score: 7  Pain Location: lower back, legs Pain Descriptors / Indicators: Sore Pain Intervention(s): Monitored during session;Premedicated before session     Hand Dominance Right   Extremity/Trunk Assessment Upper Extremity Assessment Upper Extremity Assessment: Overall WFL for tasks assessed   Lower Extremity Assessment Lower Extremity Assessment: Defer  to PT evaluation   Cervical / Trunk Assessment Cervical / Trunk Assessment: Normal   Communication Communication Communication: No difficulties   Cognition Arousal/Alertness: Awake/alert Behavior During Therapy: WFL for tasks assessed/performed Overall Cognitive Status: Within Functional Limits for tasks assessed                                                Home Living Family/patient expects to be discharged to:: Private residence Living Arrangements: Spouse/significant other Available Help at Discharge: Family;Available 24 hours/day Type of Home: House Home Access: Stairs to enter [employer] of Steps: 2 Entrance Stairs-Rails:  (1 rail) Home Layout: Two level;Able to live on main level with bedroom/bathroom     Bathroom Shower/Tub: Occupational psychologist: Standard     Home Equipment: Shower seat - built in;Hand held Tourist information centre manager - 4 wheels   Additional Comments: Has an adjustable bed      Prior Functioning/Environment Level of Independence: Independent with assistive device(s)        Comments: ambulates with rolling walker        OT Problem List: Decreased activity tolerance;Impaired balance (sitting and/or standing);Decreased safety awareness;Decreased knowledge of use of DME or AE;Pain      OT Treatment/Interventions: Self-care/ADL training;DME and/or AE instruction;Energy conservation;Therapeutic activities;Patient/family education;Balance training    OT Goals(Current goals can be found in the care plan section) Acute Rehab OT Goals Patient Stated Goal: home today OT Goal Formulation: With patient/family Time For Goal Achievement: 11/10/20 Potential to Achieve Goals: Good  OT Frequency: Min 2X/week    AM-PAC OT "6 Clicks" Daily Activity     Outcome Measure Help from another person eating meals?: None Help from another person taking care of personal grooming?: A Little Help from another person toileting,  which includes using toliet, bedpan, or urinal?: A Little Help from another person bathing (including washing, rinsing, drying)?: A Little Help from another person to put on and taking off regular upper body clothing?: A Little Help from another person to put on and taking off regular lower body clothing?: A Little 6 Click Score: 19   End of Session Equipment Utilized During Treatment: Gait belt;Rolling walker Nurse Communication: Mobility status  Activity Tolerance: Patient tolerated treatment well Patient left: Other (comment);with call bell/phone within reach;with family/visitor present (seated EOB)  OT Visit Diagnosis: Repeated falls (R29.6);Pain Pain - Right/Left: Left Pain - part of body: Leg                Time: 1400-1423 OT Time Calculation (min): 23 min Charges:  OT General Charges $OT Visit: 1 Visit OT Evaluation $OT Eval Low Complexity: 1 Low OT Treatments $Self Care/Home Management : 8-22 mins  [name] OT OT pager: [phone]  [name] 10/27/2020, 2:33 PM

## 2020-10-27 NOTE — Consult Note (Signed)
Referral MD  Reason for Referral: Bony pain-Kappa light chain myeloma  Chief Complaint  Patient presents with  . Weakness  : I have pain in my legs.  HPI: Bruce Smith is a very nice 69 year old white male.  I know him well.  He is in a with extensive bony involvement by light chain myeloma.  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  He has had radiation therapy to different areas.  I think he had a fracture in his left scapula.  He had radiation therapy for this.  He did have surgery for a fracture in the left femur.  He has had radiation therapy to this area.  He has been treated with aggressive chemotherapy.  He has had daratumumab with Velcade and Revlimid.  He has had a very nice response by his last studies.  We had to drop the Revlimid because of some toxicity.  He has had no appetite.  No matter what we try, he just does not eat much.  He had a recent bone marrow biopsy.  This is actually done last week.  This was done on 23 December.  The pathology report showed a hypocellular marrow with pan hypoplasia.  There is no significant plasma cell population.  His labs show that his kappa light chain levels have normalized.  He still is having this pain.  I am not sure as to why he has had all this pain.  Had a PET scan done which showed improved activity in his bones.  He has been getting Xgeva.  He was in the emergency room yesterday.  He was admitted.  He ultimately had MRI of the back and pelvis.  This did not show any obvious fracture.  He had a lytic lesions in the spine and pelvis.  There was no cord involvement.  There is no enlarged pelvic lymph nodes.  He had no obvious spinal stenosis or spinal nerve compression.  Again, I am not sure why has all this pain.  I know he is being on some pain medications at home.  Again he has had no appetite.  I think we may have tried him on some Marinol which he did not do well with.  I will try him on some Toradol and Decadron to see  this might help a little bit.  He is s able to move his legs.  He has had no change in bowel or bladder habits.  He has had no fever.  There has been no problems with bleeding.  He has had no incontinence.  I do appreciate the care that he is getting from all the staff on 3 W.    Past Medical History:  Diagnosis Date  . Atherosclerosis   . Coronary artery disease   . Goals of care, counseling/discussion 06/29/2020  . 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 Hyperlipidemia   . Hypertension   . 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 Multiple myeloma ([place]) 07/21/2020  . Myocardial infarction [facility])   :  Past Surgical History:  Procedure Laterality Date  . ANTERIOR CERVICAL DECOMP/DISCECTOMY FUSION N/A 06/17/2020   Procedure: ANTERIOR CERVICAL DECOMPRESSION/DISCECTOMY FUSION, INTERBODY PROSTHESIS, PLATE/SCREWS CERVICAL FIVE- CERVICAL SIX;  Surgeon: Newman Pies, MD;  Location: [place];  Service: Neurosurgery;  Laterality: N/A;  ANTERIOR CERVICAL DECOMPRESSION/DISCECTOMY FUSION, INTERBODY PROSTHESIS, PLATE/SCREWS CERVICAL FIVE- CERVICAL SIX  . CARDIAC CATHETERIZATION    . FEMUR IM NAIL Left 07/14/2020   Procedure: OPEN REDUCTION INTERNAL FIXATION (ORIF  LEFT FEMORAL NAIL FRACTURE;  Surgeon: Marchia Bond, MD;  Location: [place];  Service: Orthopedics;  Laterality: Left;  :   Current Facility-Administered Medications:  .  [COMPLETED] sodium chloride 0.9 % bolus 1,000 mL, 1,000 mL, Intravenous, Once, Stopped at 10/26/20 1333 **FOLLOWED BY** 0.9 %  sodium chloride infusion, 1,000 mL, Intravenous, Continuous, Dorie Rank, MD, Last Rate: 125 mL/hr at 10/27/20 1344, 1,000 mL at 10/27/20 1344 .  aspirin EC tablet 81 mg, 81 mg, Oral, Daily, Kyle, Tyrone A, DO, 81 mg at 10/27/20 0803 .  enoxaparin (LOVENOX) injection 40 mg, 40 mg, Subcutaneous, Q24H, Kyle, Tyrone A, DO, 40 mg at 10/26/20 2026 .  HYDROmorphone (DILAUDID) injection 1 mg, 1 mg, Intravenous, Q4H PRN, Kyle,  Tyrone A, DO, 1 mg at 10/26/20 2227 .  ketorolac (TORADOL) 15 MG/ML injection 30 mg, 30 mg, Intravenous, Q8H, Volanda Napoleon, MD, 30 mg at 10/27/20 1342 .  LORazepam (ATIVAN) tablet 0.5 mg, 0.5 mg, Oral, Q6H PRN, Marylyn Ishihara, Tyrone A, DO .  metoprolol succinate (TOPROL-XL) 24 hr tablet 50 mg, 50 mg, Oral, Daily, Kyle, Tyrone A, DO, 50 mg at 10/27/20 0802 .  morphine (MSIR) tablet 15 mg, 15 mg, Oral, Q6H PRN, Kyle, Tyrone A, DO, 15 mg at 10/27/20 0756 .  OLANZapine (ZYPREXA) tablet 10 mg, 10 mg, Oral, QHS, Kyle, Tyrone A, DO, 10 mg at 10/26/20 2227 .  pantoprazole (PROTONIX) EC tablet 40 mg, 40 mg, Oral, BID, Volanda Napoleon, MD, 40 mg at 10/27/20 0801 .  prasugrel (EFFIENT) tablet 10 mg, 10 mg, Oral, Daily, Kyle, Tyrone A, DO, 10 mg at 10/27/20 0801 .  pregabalin (LYRICA) capsule 100 mg, 100 mg, Oral, BID, Bonnielee Haff, MD .  prochlorperazine (COMPAZINE) tablet 10 mg, 10 mg, Oral, Q6H PRN, Marylyn Ishihara, Tyrone A, DO .  traMADol (ULTRAM) tablet 50 mg, 50 mg, Oral, TID PRN, Marylyn Ishihara, Tyrone A, DO .  triamterene-hydrochlorothiazide (MAXZIDE) 75-50 MG per tablet 1 tablet, 1 tablet, Oral, Daily, Kyle, Tyrone A, DO, 1 tablet at 10/27/20 0802 .  valACYclovir (VALTREX) tablet 1,000 mg, 1,000 mg, Oral, Daily, Kyle, Tyrone A, DO, 1,000 mg at 10/27/20 0802:  . aspirin EC  81 mg Oral Daily  . enoxaparin (LOVENOX) injection  40 mg Subcutaneous Q24H  . ketorolac  30 mg Intravenous Q8H  . metoprolol succinate  50 mg Oral Daily  . OLANZapine  10 mg Oral QHS  . pantoprazole  40 mg Oral BID  . prasugrel  10 mg Oral Daily  . pregabalin  100 mg Oral BID  . triamterene-hydrochlorothiazide  1 tablet Oral Daily  . valACYclovir  1,000 mg Oral Daily  :  No Known Allergies:  History reviewed. No pertinent family history.:  Social History   Socioeconomic History  . Marital status: Married    Spouse name: Not on file  . Number of children: Not on file  . Years of education: Not on file  . Highest education level: Not  on file  Occupational History  . Not on file  Tobacco Use  . Smoking status: Former Smoker    Quit date: 2000    Years since quitting: 22.0  . Smokeless tobacco: Current User    Types: Chew  Vaping Use  . Vaping Use: Never used  Substance and Sexual Activity  . Alcohol use: Not on file    Comment: rare  . Drug use: Never  . Sexual activity: Not on file  Other Topics Concern  . Not on file  Social History Narrative  .  Not on file   Social Determinants of Health   Financial Resource Strain: Not on file  Food Insecurity: Not on file  Transportation Needs: Not on file  Physical Activity: Not on file  Stress: Not on file  Social Connections: Not on file  Intimate Partner Violence: Not on file  :  Review of Systems  Constitutional: Positive for weight loss.  HENT: Negative.   Eyes: Negative.   Respiratory: Negative.   Cardiovascular: Negative.   Gastrointestinal: Positive for nausea.  Genitourinary: Negative.   Musculoskeletal: Positive for back pain and joint pain.  Neurological: Positive for focal weakness.  Endo/Heme/Allergies: Negative.   Psychiatric/Behavioral: Negative.      Exam:  Physical Exam Vitals reviewed.  HENT:     Head: Normocephalic and atraumatic.     Mouth/Throat:     Mouth: Oropharynx is clear and moist.  Eyes:     Extraocular Movements: EOM normal.     Pupils: Pupils are equal, round, and reactive to light.  Cardiovascular:     Rate and Rhythm: Normal rate and regular rhythm.     Heart sounds: Normal heart sounds.  Pulmonary:     Effort: Pulmonary effort is normal.     Breath sounds: Normal breath sounds.  Abdominal:     General: Bowel sounds are normal.     Palpations: Abdomen is soft.  Musculoskeletal:        General: No tenderness, deformity or edema. Normal range of motion.     Cervical back: Normal range of motion.  Lymphadenopathy:     Cervical: No cervical adenopathy.  Skin:    General: Skin is warm and dry.     Findings:  No erythema or rash.  Neurological:     Mental Status: He is alert and oriented to person, place, and time.  Psychiatric:        Mood and Affect: Mood and affect normal.        Behavior: Behavior normal.        Thought Content: Thought content normal.        Judgment: Judgment normal.     Patient Vitals for the past 24 hrs:  BP Temp Temp src Pulse Resp SpO2 Height Weight  10/27/20 0603 (!) 141/80 98.3 F (36.8 C) Oral 75 16 97 % -- --  10/27/20 0146 134/79 98.7 F (37.1 C) Oral 85 16 97 % -- --  10/26/20 2214 127/70 99.3 F (37.4 C) Oral 84 16 98 % -- --  10/26/20 1835 -- -- -- -- -- -- '5\' 8"'  (1.727 m) 171 lb (77.6 kg)  10/26/20 1831 (!) 167/88 -- -- 96 18 98 % -- --  10/26/20 1625 -- 98.1 F (36.7 C) Oral -- -- -- -- --  10/26/20 1600 (!) 170/76 -- -- 100 18 95 % -- --     Recent Labs    10/26/20 1236 10/27/20 0258  WBC 9.1 5.0  HGB 12.2* 11.3*  HCT 36.7* 34.0*  PLT 214 196   Recent Labs    10/26/20 1236 10/27/20 0258  NA 134* 136  K 4.3 3.9  CL 100 104  CO2 23 24  GLUCOSE 125* 107*  BUN 23 16  CREATININE 0.69 0.65  CALCIUM 8.6* 8.3*    Blood smear review: None  Pathology: None    Assessment and Plan: Mr. Consalvo is a very nice 69 year old gentleman with Kappa light chain myeloma.  He has had treatment so far that has worked very nicely.  However, his overall  status is not really better.  I still am not sure as to what the problem is.  Again, he has  had bony lesions in the past.  He had radiation therapy which has worked.  I just am not sure what we can target with radiation at this time.  His x-rays do not show any fracture.  There is no spinal cord compression.  There is no spinal stenosis.  Maybe he does have a lot of inflammation of the bones.  We will see if Toradol Decadron can help.  I am not sure when he will go to [facility] for transplant evaluation.  I am sure we will get him back in the office for more treatment.  I just wish that he would feel  better.  Again, I do appreciate the great care that he is getting from all staff on 3 W.   Bruce Haw, MD  Darlyn Chamber 17:14

## 2020-10-27 NOTE — Progress Notes (Signed)
Revlimid not refilled per order of Dr. Myna Hidalgo.

## 2020-10-27 NOTE — TOC Initial Note (Signed)
Transition of Care [facility]) - Initial/Assessment Note    Patient Details  Name: Bruce Smith MRN: [id number] Date of Birth: 1951/09/08  Transition of Care (TOC) CM/SW Contact:    Joaquin Courts, RN Phone Number: 10/27/2020, 3:23 PM  Clinical Narrative:                 CM attempted to arrange [facility] PT services for patient, reached out to all area providers [facility], [facility], [facility], [facility], [facility], [facility], [facility], [facility], [facility]).  All agencies have declined, due to being out of network or not having the staffing to take a referral at this time.  CM contacted MD and made him aware no HH options at this time. Referral made to outpatient PT services.  Expected Discharge Plan: Home/Self Care Barriers to Discharge: No Barriers Identified   Patient Goals and CMS Choice Patient states their goals for this hospitalization and ongoing recovery are:: to go home      Expected Discharge Plan and Services Expected Discharge Plan: Home/Self Care   Discharge Planning Services: CM Consult   Living arrangements for the past 2 months: Single Family Home Expected Discharge Date: 10/27/20                                    Prior Living Arrangements/Services Living arrangements for the past 2 months: Single Family Home Lives with:: Spouse Patient language and need for interpreter reviewed:: Yes Do you feel safe going back to the place where you live?: Yes      Need for Family Participation in Patient Care: Yes (Comment) Care giver support system in place?: Yes (comment)   Criminal Activity/Legal Involvement Pertinent to Current Situation/Hospitalization: No - Comment as needed  Activities of Daily Living Home Assistive Devices/Equipment: Engineer, drilling (specify type) ADL Screening (condition at time of admission) Patient's cognitive ability adequate to safely complete daily activities?: Yes Is the patient deaf or have difficulty hearing?: No Does the patient have difficulty  seeing, even when wearing glasses/contacts?: No Does the patient have difficulty concentrating, remembering, or making decisions?: No Patient able to express need for assistance with ADLs?: Yes Does the patient have difficulty dressing or bathing?: Yes Independently performs ADLs?: No Communication: Independent Dressing (OT): Independent Grooming: Independent Feeding: Independent Bathing: Needs assistance Is this a change from baseline?: Pre-admission baseline Toileting: Needs assistance Is this a change from baseline?: Pre-admission baseline In/Out Bed: Needs assistance Is this a change from baseline?: Pre-admission baseline Walks in Home: Needs assistance Is this a change from baseline?: Pre-admission baseline Does the patient have difficulty walking or climbing stairs?: Yes Weakness of Legs: Both Weakness of Arms/Hands: Both  Permission Sought/Granted                  Emotional Assessment Appearance:: Appears stated age Attitude/Demeanor/Rapport: Engaged Affect (typically observed): Accepting Orientation: : Oriented to Self,Oriented to Place,Oriented to  Time,Oriented to Situation   Psych Involvement: No (comment)  Admission diagnosis:  Weakness [R53.1] Intractable pain [R52] Pain of lower extremity, unspecified laterality [M79.606] Multiple myeloma, remission status unspecified ([place]) [C90.00] Patient Active Problem List   Diagnosis Date Noted  . Intractable pain 10/26/2020  . Kappa light chain myeloma ([place]) 07/21/2020  . 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 Goals of care, counseling/discussion 06/29/2020  . 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 Cervical spondylosis with myelopathy and radiculopathy 06/17/2020   PCP:  Derrill Center., MD  Pharmacy:   [place], [place] - [address] [place] [address] Phone: [phone] Fax: [phone]  [place], [place] - [place] [address] Phone: [phone] Fax: [phone]  [facility] [place], [place] [address] [place] [address] Phone: [phone] Fax: [phone]     Social Determinants of Health (SDOH) Interventions    Readmission Risk Interventions No flowsheet data found.

## 2020-10-27 NOTE — Progress Notes (Signed)
PT Cancellation Note  Patient Details Name: Bruce Smith MRN: [id number] DOB: [birth year]   Cancelled Treatment:    Reason Eval/Treat Not Completed: Other (comment). Family member stated pt's pain was finally controlled and wanted pt to be allowed to sleep. PT deferred at this time, did not wake pt per family request, will continue efforts to complete eval.    [facility] 10/27/2020, 12:27 PM

## 2020-10-28 ENCOUNTER — Telehealth: Payer: Self-pay | Admitting: Hematology & Oncology

## 2020-10-28 ENCOUNTER — Encounter (HOSPITAL_COMMUNITY): Payer: Self-pay | Admitting: Hematology & Oncology

## 2020-10-28 ENCOUNTER — Other Ambulatory Visit: Payer: Self-pay | Admitting: Hematology & Oncology

## 2020-10-28 LAB — HIV ANTIBODY (ROUTINE TESTING W REFLEX): HIV Screen 4th Generation wRfx: NONREACTIVE

## 2020-10-28 MED ORDER — MEGESTROL ACETATE 625 MG/5ML PO SUSP: 625.0000 mg | Freq: Every day | ORAL | 4 refills | Status: DC

## 2020-10-28 NOTE — Telephone Encounter (Signed)
I called and spoke with patient regarding appointment that has been added to his schedule per  12/29 sch msg per Dr Myna Hidalgo

## 2020-10-29 ENCOUNTER — Encounter (HOSPITAL_COMMUNITY): Payer: Self-pay | Admitting: Emergency Medicine

## 2020-10-29 ENCOUNTER — Telehealth: Payer: Self-pay

## 2020-10-29 ENCOUNTER — Ambulatory Visit: Payer: BC Managed Care – PPO

## 2020-10-29 ENCOUNTER — Inpatient Hospital Stay (HOSPITAL_COMMUNITY): Payer: BC Managed Care – PPO

## 2020-10-29 ENCOUNTER — Other Ambulatory Visit: Payer: Self-pay

## 2020-10-29 ENCOUNTER — Emergency Department (HOSPITAL_COMMUNITY): Payer: BC Managed Care – PPO

## 2020-10-29 ENCOUNTER — Inpatient Hospital Stay (HOSPITAL_COMMUNITY)
Admission: EM | Admit: 2020-10-29 | Discharge: 2020-11-01 | DRG: 682 | Disposition: A | Discharge status: Home Health | Payer: BC Managed Care – PPO | Attending: Family Medicine | Admitting: Family Medicine

## 2020-10-29 DIAGNOSIS — F419 Anxiety disorder, unspecified: Secondary | ICD-10-CM | POA: Diagnosis present

## 2020-10-29 DIAGNOSIS — I6389 Other cerebral infarction: Secondary | ICD-10-CM | POA: Diagnosis not present

## 2020-10-29 DIAGNOSIS — I10 Essential (primary) hypertension: Secondary | ICD-10-CM | POA: Diagnosis present

## 2020-10-29 DIAGNOSIS — R339 Retention of urine, unspecified: Secondary | ICD-10-CM | POA: Diagnosis present

## 2020-10-29 DIAGNOSIS — C9000 Multiple myeloma not having achieved remission: Secondary | ICD-10-CM | POA: Diagnosis present

## 2020-10-29 DIAGNOSIS — I951 Orthostatic hypotension: Secondary | ICD-10-CM | POA: Diagnosis present

## 2020-10-29 DIAGNOSIS — G9341 Metabolic encephalopathy: Secondary | ICD-10-CM | POA: Diagnosis present

## 2020-10-29 DIAGNOSIS — Z7952 Long term (current) use of systemic steroids: Secondary | ICD-10-CM

## 2020-10-29 DIAGNOSIS — F17220 Nicotine dependence, chewing tobacco, uncomplicated: Secondary | ICD-10-CM | POA: Diagnosis present

## 2020-10-29 DIAGNOSIS — E785 Hyperlipidemia, unspecified: Secondary | ICD-10-CM | POA: Diagnosis present

## 2020-10-29 DIAGNOSIS — Z20822 Contact with and (suspected) exposure to covid-19: Secondary | ICD-10-CM | POA: Diagnosis present

## 2020-10-29 DIAGNOSIS — Z923 Personal history of irradiation: Secondary | ICD-10-CM

## 2020-10-29 DIAGNOSIS — F32A Depression, unspecified: Secondary | ICD-10-CM | POA: Diagnosis present

## 2020-10-29 DIAGNOSIS — I2510 Atherosclerotic heart disease of native coronary artery without angina pectoris: Secondary | ICD-10-CM | POA: Diagnosis present

## 2020-10-29 DIAGNOSIS — I252 Old myocardial infarction: Secondary | ICD-10-CM

## 2020-10-29 DIAGNOSIS — Z951 Presence of aortocoronary bypass graft: Secondary | ICD-10-CM

## 2020-10-29 DIAGNOSIS — K5641 Fecal impaction: Secondary | ICD-10-CM | POA: Diagnosis present

## 2020-10-29 DIAGNOSIS — Z7982 Long term (current) use of aspirin: Secondary | ICD-10-CM

## 2020-10-29 DIAGNOSIS — K625 Hemorrhage of anus and rectum: Secondary | ICD-10-CM | POA: Diagnosis not present

## 2020-10-29 DIAGNOSIS — Z79899 Other long term (current) drug therapy: Secondary | ICD-10-CM

## 2020-10-29 DIAGNOSIS — R2990 Unspecified symptoms and signs involving the nervous system: Secondary | ICD-10-CM | POA: Diagnosis not present

## 2020-10-29 DIAGNOSIS — N179 Acute kidney failure, unspecified: Principal | ICD-10-CM

## 2020-10-29 DIAGNOSIS — E869 Volume depletion, unspecified: Secondary | ICD-10-CM | POA: Diagnosis present

## 2020-10-29 DIAGNOSIS — G8929 Other chronic pain: Secondary | ICD-10-CM | POA: Diagnosis present

## 2020-10-29 LAB — COMPREHENSIVE METABOLIC PANEL
ALT: 16 U/L (ref 0–44)
AST: 14 U/L — ABNORMAL LOW (ref 15–41)
Albumin: 3.2 g/dL — ABNORMAL LOW (ref 3.5–5.0)
Alkaline Phosphatase: 103 U/L (ref 38–126)
Anion gap: 10 (ref 5–15)
BUN: 63 mg/dL — ABNORMAL HIGH (ref 8–23)
CO2: 21 mmol/L — ABNORMAL LOW (ref 22–32)
Calcium: 7.3 mg/dL — ABNORMAL LOW (ref 8.9–10.3)
Chloride: 103 mmol/L (ref 98–111)
Creatinine, Ser: 3.69 mg/dL — ABNORMAL HIGH (ref 0.61–1.24)
GFR, Estimated: 17 mL/min — ABNORMAL LOW (ref >60)
Glucose, Bld: 96 mg/dL (ref 70–99)
Potassium: 4.7 mmol/L (ref 3.5–5.1)
Sodium: 134 mmol/L — ABNORMAL LOW (ref 135–145)
Total Bilirubin: 0.8 mg/dL (ref 0.3–1.2)
Total Protein: 5.3 g/dL — ABNORMAL LOW (ref 6.5–8.1)

## 2020-10-29 LAB — CBC WITH DIFFERENTIAL/PLATELET
Abs Immature Granulocytes: 0.07 10*3/uL (ref 0.00–0.07)
Basophils Absolute: 0 10*3/uL (ref 0.0–0.1)
Basophils Relative: 0 %
Eosinophils Absolute: 0 10*3/uL (ref 0.0–0.5)
Eosinophils Relative: 0 %
HCT: 31.7 % — ABNORMAL LOW (ref 39.0–52.0)
Hemoglobin: 10.4 g/dL — ABNORMAL LOW (ref 13.0–17.0)
Immature Granulocytes: 1 %
Lymphocytes Relative: 2 %
Lymphs Abs: 0.2 10*3/uL — ABNORMAL LOW (ref 0.7–4.0)
MCH: 30.3 pg (ref 26.0–34.0)
MCHC: 32.8 g/dL (ref 30.0–36.0)
MCV: 92.4 fL (ref 80.0–100.0)
Monocytes Absolute: 0.9 10*3/uL (ref 0.1–1.0)
Monocytes Relative: 8 %
Neutro Abs: 9.6 10*3/uL — ABNORMAL HIGH (ref 1.7–7.7)
Neutrophils Relative %: 89 %
Platelets: 167 10*3/uL (ref 150–400)
RBC: 3.43 MIL/uL — ABNORMAL LOW (ref 4.22–5.81)
RDW: 16.8 % — ABNORMAL HIGH (ref 11.5–15.5)
WBC: 10.8 10*3/uL — ABNORMAL HIGH (ref 4.0–10.5)
nRBC: 0 % (ref 0.0–0.2)

## 2020-10-29 LAB — URINALYSIS, ROUTINE W REFLEX MICROSCOPIC
Bilirubin Urine: NEGATIVE
Glucose, UA: NEGATIVE mg/dL
Hgb urine dipstick: NEGATIVE
Ketones, ur: NEGATIVE mg/dL
Leukocytes,Ua: NEGATIVE
Nitrite: NEGATIVE
Protein, ur: NEGATIVE mg/dL
Specific Gravity, Urine: 1.008 (ref 1.005–1.030)
pH: 5 (ref 5.0–8.0)

## 2020-10-29 LAB — CBG MONITORING, ED: Glucose-Capillary: 83 mg/dL (ref 70–99)

## 2020-10-29 LAB — SARS CORONAVIRUS 2 (TAT 6-24 HRS): SARS Coronavirus 2: NEGATIVE

## 2020-10-29 LAB — LACTATE DEHYDROGENASE: LDH: 153 U/L (ref 98–192)

## 2020-10-29 IMAGING — MR MR HEAD W/O CM
11 series · 48 of 48 positions shown · non-contrast
Comparison: CT head [DATE]

CLINICAL DATA: Acute neuro deficit. Dizziness. History of multiple
myeloma.

EXAM:
MRI HEAD WITHOUT CONTRAST
TECHNIQUE: Multiplanar, multiecho pulse sequences of the brain and surrounding
structures were obtained without intravenous contrast.

[Series 5: DWI · axial · 3.0mm · 1.36mm/px · z∈[-51,+96]mm · 7 of 104 slices shown (1 of 4)]
[im 1/104]
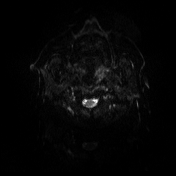
[im 18/104]
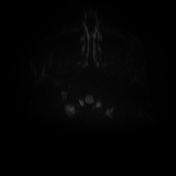
[im 35/104]
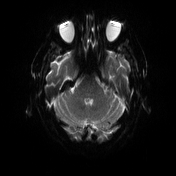
[im 52/104]
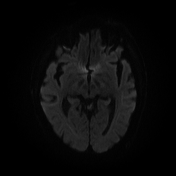
[im 69/104]
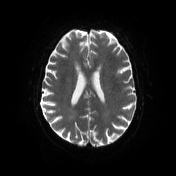
[im 86/104]
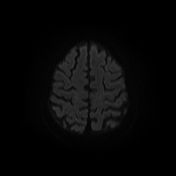
[im 104/104]
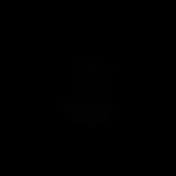

[Series 6: DWI · axial · 3.0mm · 1.36mm/px · z∈[-51,+93]mm · 4 of 51 slices shown (2 of 4)]
[im 1/51]
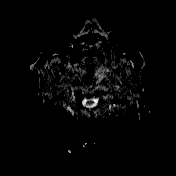
[im 17/51]
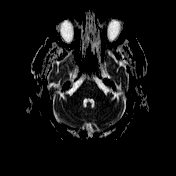
[im 34/51]
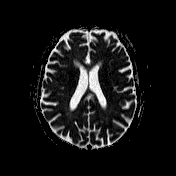
[im 51/51]
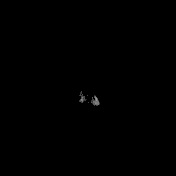

[Series 7: T1 · sagittal · 5.0mm · 0.75mm/px · 2 of 25 slices shown (1 of 2)]
[im 1/25]
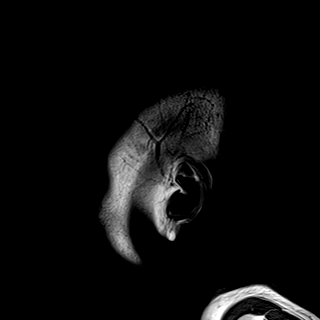
[im 25/25]
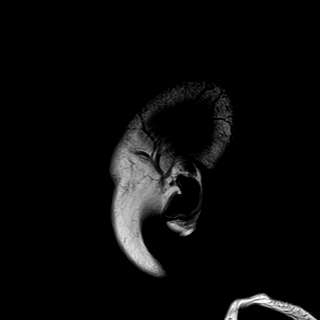

[Series 8: T2 · axial · 5.0mm · 0.62mm/px · z∈[-54,+96]mm · 2 of 25 slices shown (1 of 2)]
[im 1/25]
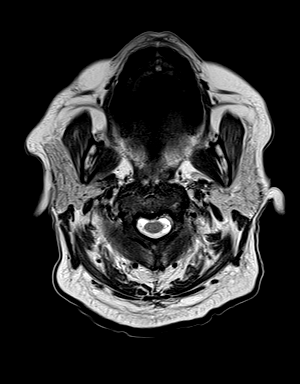
[im 25/25]
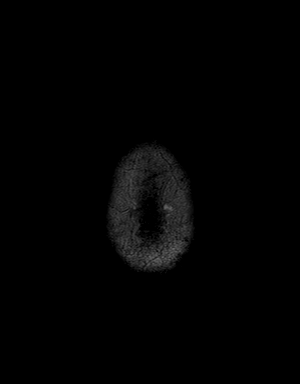

[Series 9: mip_images(sw) · axial · 24.0mm · 0.75mm/px · z∈[-48,+90]mm · 3 of 49 slices shown]
[im 1/49]
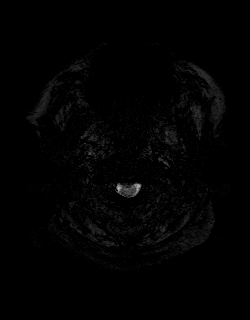
[im 25/49]
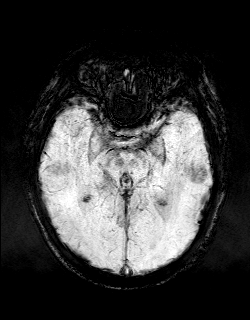
[im 49/49]
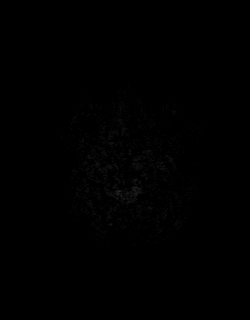

[Series 10: swi_images · axial · 3.0mm · 0.75mm/px · z∈[-58,+100]mm · 4 of 56 slices shown]
[im 1/56]
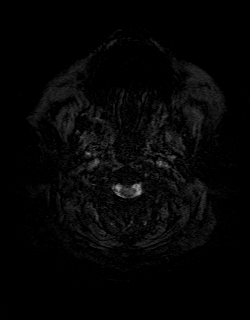
[im 19/56]
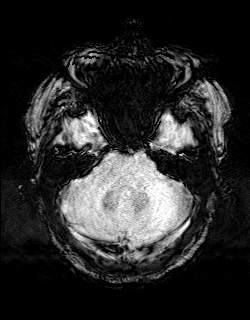
[im 37/56]
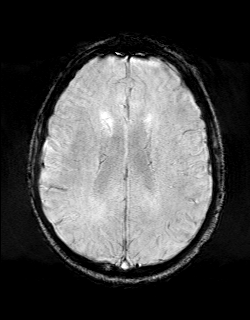
[im 56/56]
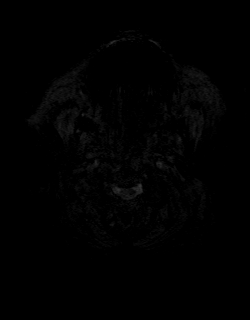

[Series 11: FLAIR · axial · 3.0mm · 0.75mm/px · z∈[-55,+97]mm · 4 of 54 slices shown]
[im 1/54]
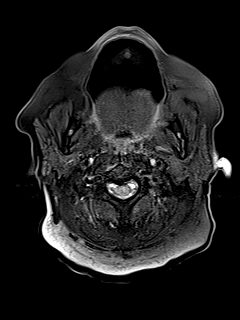
[im 18/54]
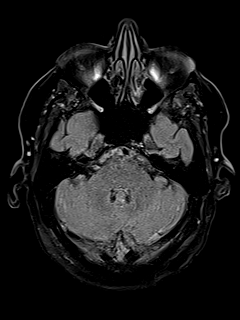
[im 36/54]
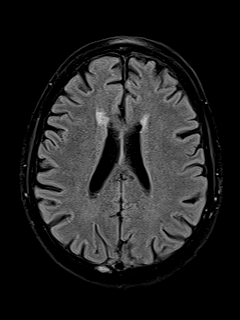
[im 54/54]
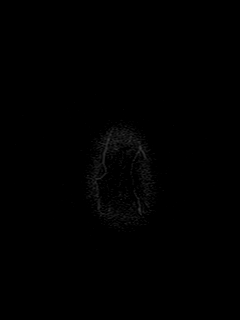

[Series 12: T1 · axial · 1.0mm · 0.94mm/px · z∈[-55,+98]mm · 11 of 160 slices shown (2 of 2)]
[im 1/160]
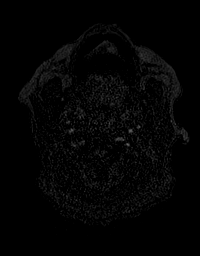
[im 16/160]
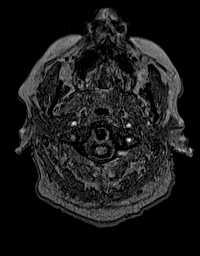
[im 32/160]
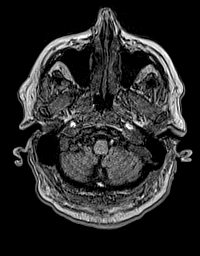
[im 48/160]
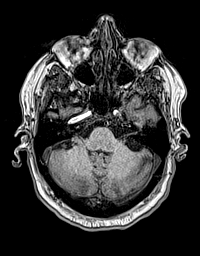
[im 64/160]
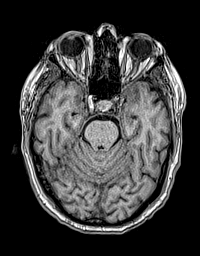
[im 80/160]
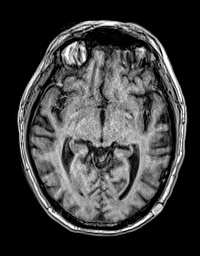
[im 96/160]
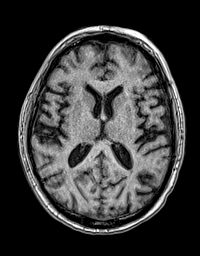
[im 112/160]
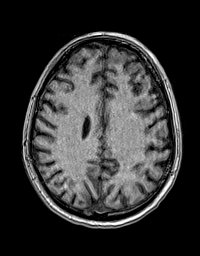
[im 128/160]
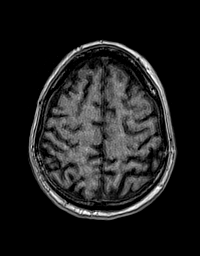
[im 144/160]
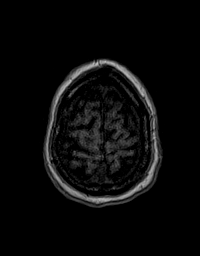
[im 160/160]
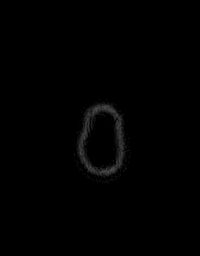

[Series 13: DWI · coronal · 5.0mm · 1.31mm/px · 5 of 76 slices shown (3 of 4)]
[im 1/76]
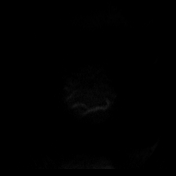
[im 19/76]
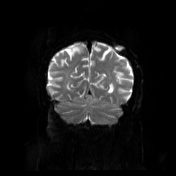
[im 38/76]
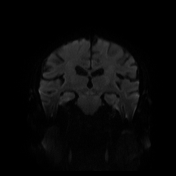
[im 57/76]
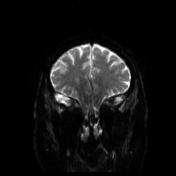
[im 76/76]
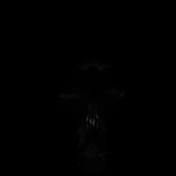

[Series 14: DWI · coronal · 5.0mm · 1.31mm/px · 3 of 38 slices shown (4 of 4)]
[im 1/38]
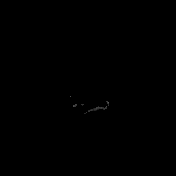
[im 19/38]
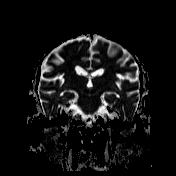
[im 38/38]
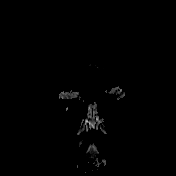

[Series 15: T2 · coronal · 5.0mm · 0.57mm/px · 3 of 38 slices shown (2 of 2)]
[im 1/38]
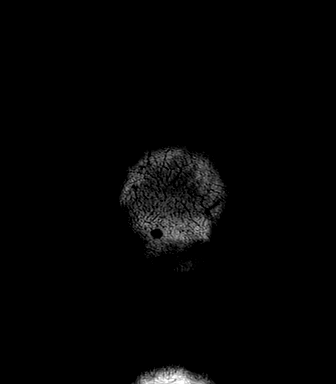
[im 19/38]
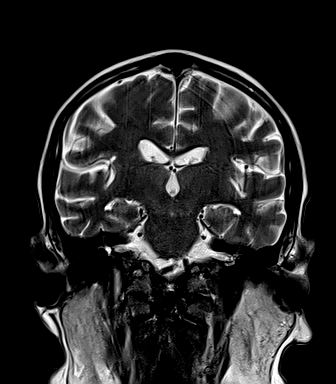
[im 38/38]
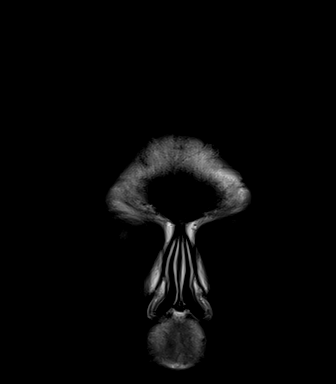

[48 of 48 positions shown; findings below may reference images not displayed]

FINDINGS: Brain: Negative for acute infarct. No significant white matter
changes.

Negative for hemorrhage or mass.  Ventricle size normal.

Vascular: Normal arterial flow voids.

Skull and upper cervical spine: Multiple lesions in the calvarium
compatible with myeloma as noted on CT. Largest lesion in the left
parietal bone.

Sinuses/Orbits: Paranasal sinuses clear.  Negative orbit

Other: None
IMPRESSION: No acute intracranial abnormality

Multiple lytic skull lesions compatible with myeloma

## 2020-10-29 IMAGING — CT CT HEAD W/O CM
3 series · 16 of 47 positions shown, 19 images · non-contrast
Comparison: [DATE].

CLINICAL DATA: Dizziness. Suspected stroke. Multiple myeloma.

EXAM:
CT HEAD WITHOUT CONTRAST
TECHNIQUE: Contiguous axial images were obtained from the base of the skull
through the vertex without intravenous contrast.

[Series 2: head wo · axial · 0.43mm/px · z∈[+1498,+1623]mm · 10 of 31 slices shown, 13 images]
[im 3/31  brain]
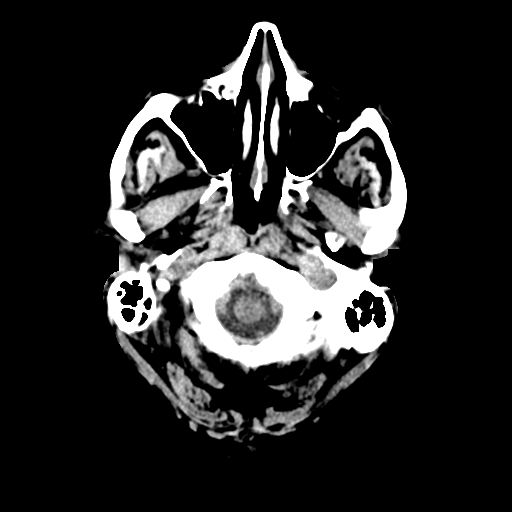
[im 3/31  bone]
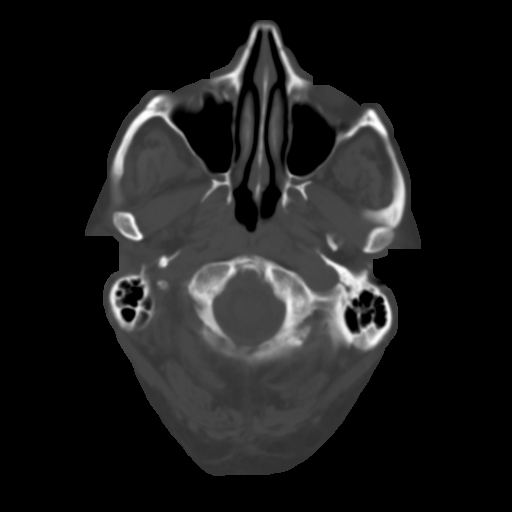
[im 6/31  brain]
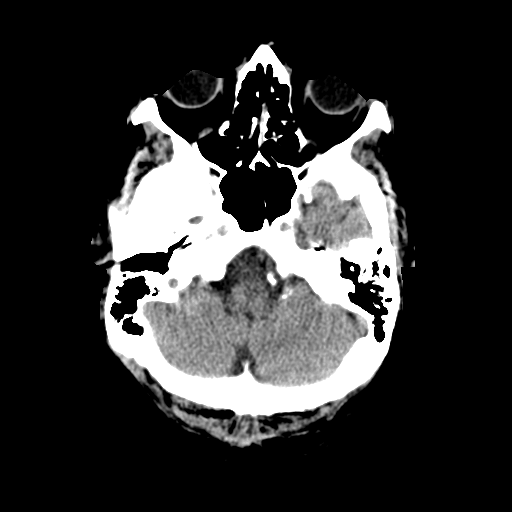
[im 9/31  brain]
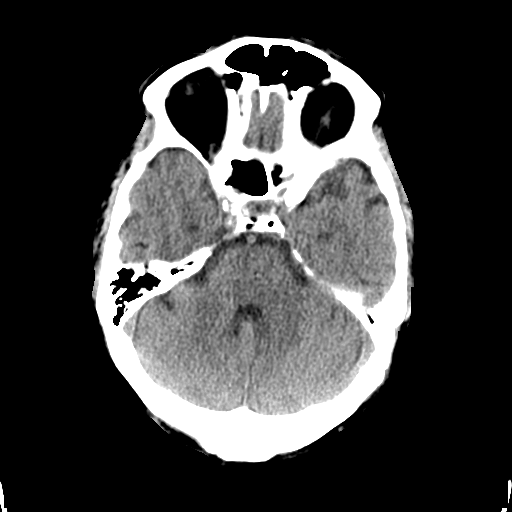
[im 11/31  brain]
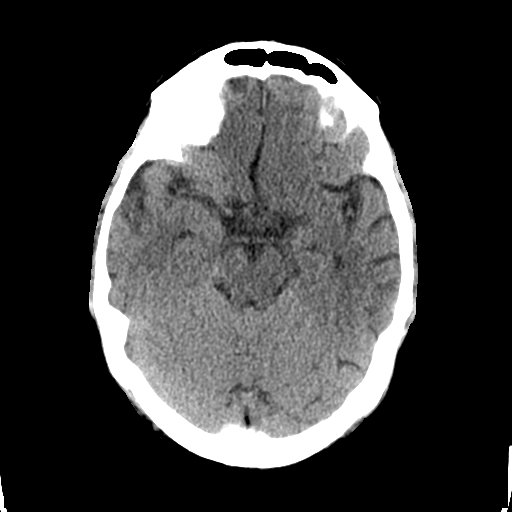
[im 14/31  brain]
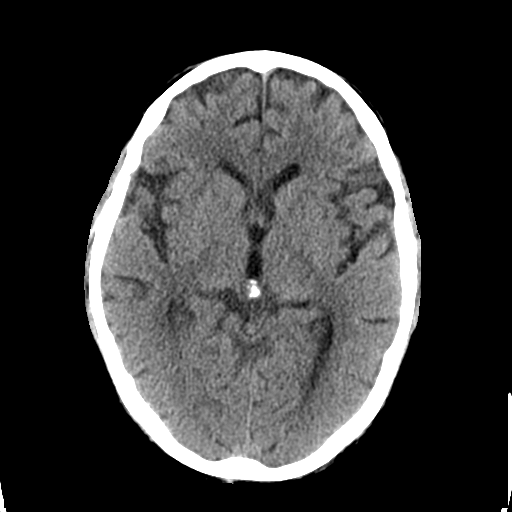
[im 14/31  bone]
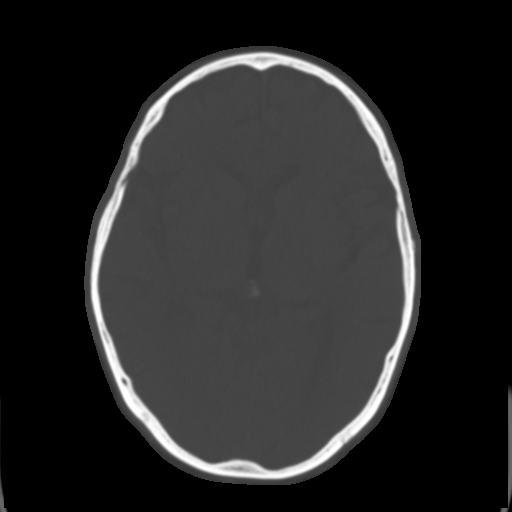
[im 17/31  brain]
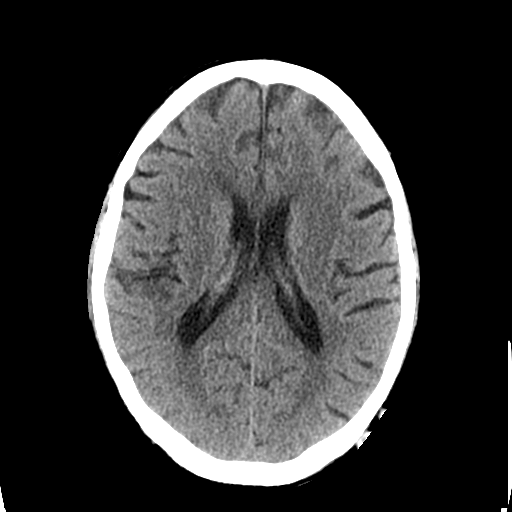
[im 20/31  brain]
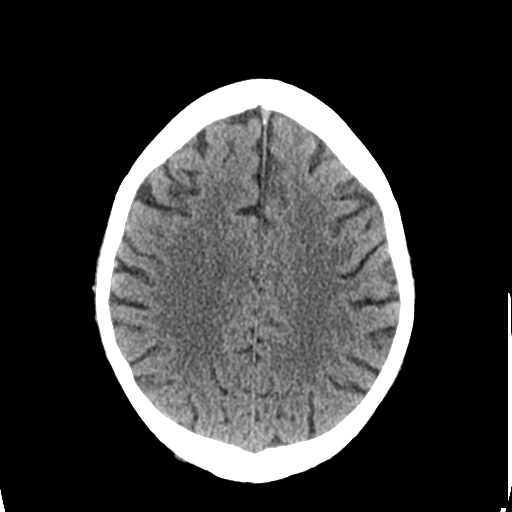
[im 23/31  brain]
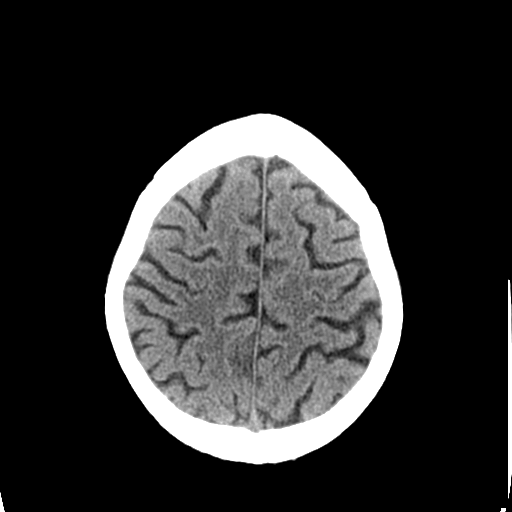
[im 25/31  brain]
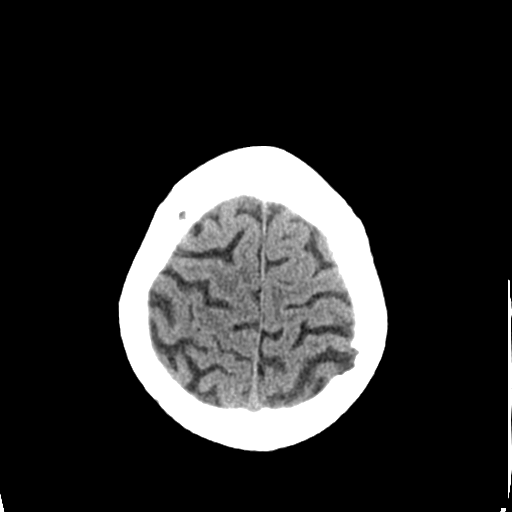
[im 25/31  bone]
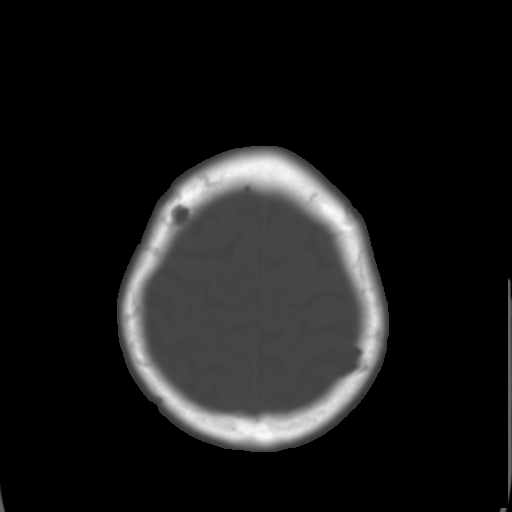
[im 28/31  brain]
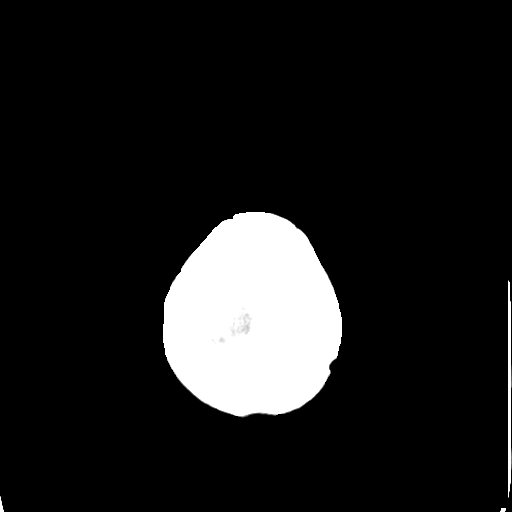

[Series 5: coronal soft tissue · coronal · 0.30mm/px · 3 of 66 slices shown]
[im 22/66  brain]
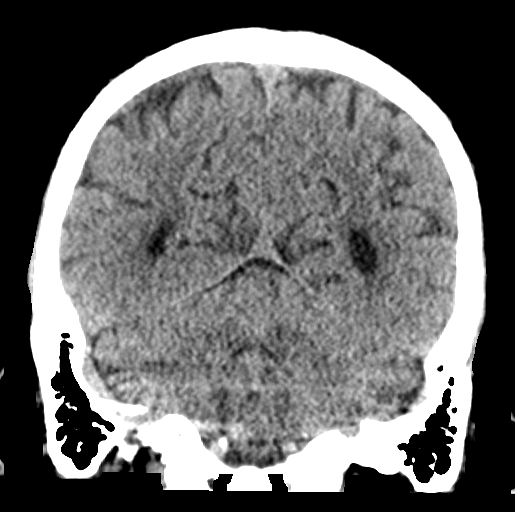
[im 29/66  brain]
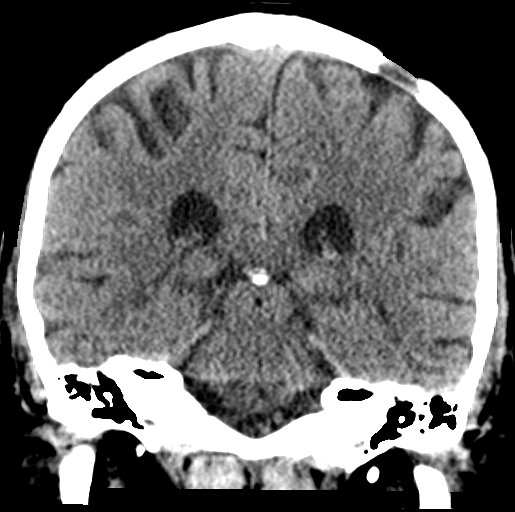
[im 37/66  brain]
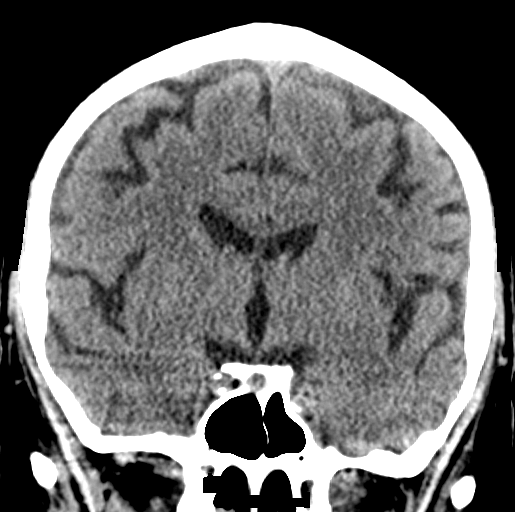

[Series 6: sagittal soft tissue · sagittal · 0.29mm/px · 3 of 51 slices shown]
[im 17/51  brain]
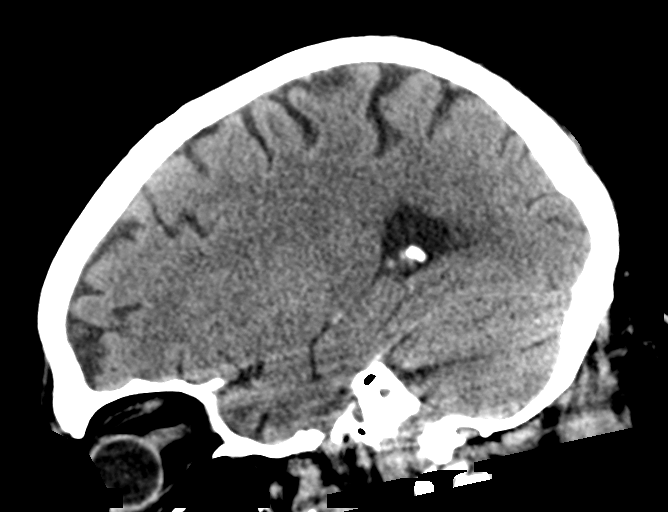
[im 26/51  brain]
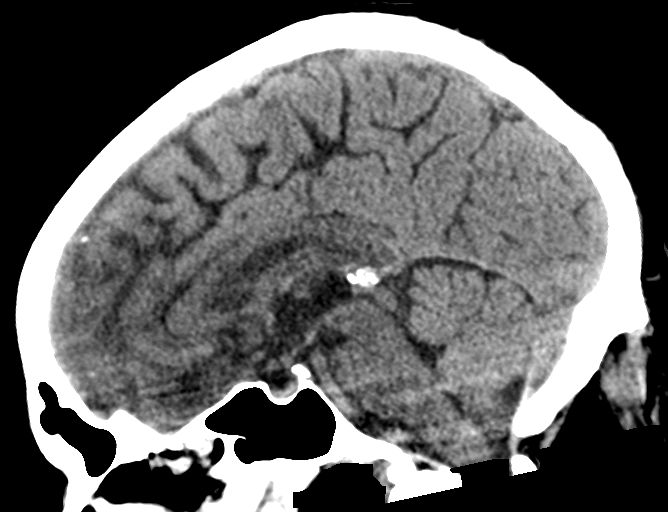
[im 34/51  brain]
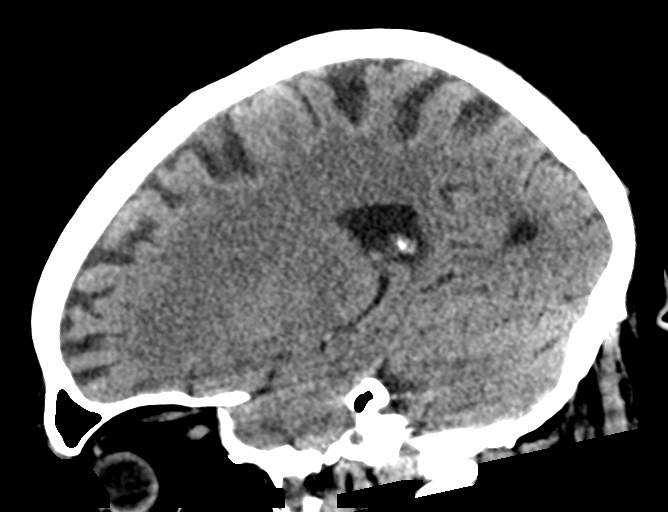

[16 of 47 positions shown; findings below may reference images not displayed]

FINDINGS: Brain: Stable mild diffuse enlargement of the cortical sulci with
normal size and position of the ventricles. No intracranial
hemorrhage, mass lesion or CT evidence of acute infarction.

Vascular: No hyperdense vessel or unexpected calcification.

Skull: No significant change in multiple lytic lesions in the skull.

Sinuses/Orbits: Unremarkable.

Other: None.
IMPRESSION: 1. No acute abnormality.
2. Stable mild diffuse cerebral atrophy.
3. No significant change in multiple lytic lesions in the skull,
compatible with the patient's known multiple myeloma.

## 2020-10-29 MED ORDER — MEGESTROL ACETATE 400 MG/10ML PO SUSP
625.0000 mg | Freq: Every day | ORAL | Status: DC
  Administered 2020-10-29 – 2020-11-01 (×4): 625 mg via ORAL
  Filled 2020-10-29 (×4): qty 20

## 2020-10-29 MED ORDER — SENNOSIDES-DOCUSATE SODIUM 8.6-50 MG PO TABS
2.0000 | ORAL_TABLET | Freq: Every day | ORAL | Status: DC | PRN
  Administered 2020-10-31: 2 via ORAL
  Filled 2020-10-29: qty 2

## 2020-10-29 MED ORDER — SODIUM CHLORIDE 0.9% FLUSH
3.0000 mL | Freq: Two times a day (BID) | INTRAVENOUS | Status: DC
  Administered 2020-10-29 – 2020-10-30 (×2): 3 mL via INTRAVENOUS

## 2020-10-29 MED ORDER — ACETAMINOPHEN 325 MG PO TABS
650.0000 mg | ORAL_TABLET | Freq: Four times a day (QID) | ORAL | Status: DC | PRN
  Administered 2020-10-29: 650 mg via ORAL
  Filled 2020-10-29: qty 2

## 2020-10-29 MED ORDER — STROKE: EARLY STAGES OF RECOVERY BOOK
Freq: Once | Status: DC
  Filled 2020-10-29: qty 1

## 2020-10-29 MED ORDER — ATORVASTATIN CALCIUM 20 MG PO TABS
20.0000 mg | ORAL_TABLET | Freq: Every day | ORAL | Status: DC
  Administered 2020-10-29 – 2020-10-30 (×2): 20 mg via ORAL
  Filled 2020-10-29: qty 1
  Filled 2020-10-29: qty 2

## 2020-10-29 MED ORDER — NITROGLYCERIN 0.4 MG SL SUBL: 0.4000 mg | SUBLINGUAL_TABLET | SUBLINGUAL | Status: DC | PRN

## 2020-10-29 MED ORDER — POLYETHYLENE GLYCOL 3350 17 G PO PACK: 17.0000 g | PACK | Freq: Every day | ORAL | Status: DC | PRN

## 2020-10-29 MED ORDER — FENTANYL 25 MCG/HR TD PT72
1.0000 | MEDICATED_PATCH | TRANSDERMAL | Status: DC
Start: 2020-10-29 — End: 2020-10-29

## 2020-10-29 MED ORDER — SENNA-DOCUSATE SODIUM 8.6-50 MG PO TABS: 2.0000 | ORAL_TABLET | Freq: Every day | ORAL | Status: DC | PRN

## 2020-10-29 MED ORDER — METOPROLOL SUCCINATE ER 50 MG PO TB24
50.0000 mg | ORAL_TABLET | Freq: Every day | ORAL | Status: DC
  Administered 2020-10-29 – 2020-10-30 (×2): 50 mg via ORAL
  Filled 2020-10-29 (×2): qty 1

## 2020-10-29 MED ORDER — ASPIRIN 81 MG PO TBEC: 81.0000 mg | DELAYED_RELEASE_TABLET | Freq: Every day | ORAL | Status: DC

## 2020-10-29 MED ORDER — PANTOPRAZOLE SODIUM 40 MG PO TBEC
40.0000 mg | DELAYED_RELEASE_TABLET | Freq: Every day | ORAL | Status: DC
  Administered 2020-10-29 – 2020-11-01 (×4): 40 mg via ORAL
  Filled 2020-10-29 (×4): qty 1

## 2020-10-29 MED ORDER — VALACYCLOVIR HCL 500 MG PO TABS
1000.0000 mg | ORAL_TABLET | Freq: Every day | ORAL | Status: DC
  Administered 2020-10-29 – 2020-11-01 (×4): 1000 mg via ORAL
  Filled 2020-10-29 (×4): qty 2

## 2020-10-29 MED ORDER — PRASUGREL HCL 10 MG PO TABS
10.0000 mg | ORAL_TABLET | Freq: Every day | ORAL | Status: DC
  Administered 2020-10-29 – 2020-10-30 (×2): 10 mg via ORAL
  Filled 2020-10-29 (×3): qty 1

## 2020-10-29 MED ORDER — LORAZEPAM 0.5 MG PO TABS
0.5000 mg | ORAL_TABLET | Freq: Four times a day (QID) | ORAL | Status: DC | PRN
  Administered 2020-10-29 – 2020-10-30 (×2): 0.5 mg via ORAL
  Filled 2020-10-29 (×2): qty 1

## 2020-10-29 MED ORDER — FENTANYL 25 MCG/HR TD PT72: 1.0000 | MEDICATED_PATCH | TRANSDERMAL | Status: DC

## 2020-10-29 MED ORDER — ASPIRIN EC 81 MG PO TBEC
81.0000 mg | DELAYED_RELEASE_TABLET | Freq: Every day | ORAL | Status: DC
  Administered 2020-10-30: 81 mg via ORAL
  Filled 2020-10-29 (×3): qty 1

## 2020-10-29 MED ORDER — HEPARIN SODIUM (PORCINE) 5000 UNIT/ML IJ SOLN
5000.0000 [IU] | Freq: Three times a day (TID) | INTRAMUSCULAR | Status: DC
  Administered 2020-10-29 – 2020-11-01 (×8): 5000 [IU] via SUBCUTANEOUS
  Filled 2020-10-29 (×8): qty 1

## 2020-10-29 MED ORDER — ACETAMINOPHEN 650 MG RE SUPP: 650.0000 mg | Freq: Four times a day (QID) | RECTAL | Status: DC | PRN

## 2020-10-29 MED ORDER — SODIUM CHLORIDE 0.9 % IV BOLUS
1000.0000 mL | Freq: Once | INTRAVENOUS | Status: AC
  Administered 2020-10-29: 1000 mL via INTRAVENOUS

## 2020-10-29 NOTE — Telephone Encounter (Signed)
Pts wife called stating that pt was in bad shape and that he had not urinated in 24hrs, per [place] [facility]. She has been advised to take him to the ER, she is aware and will bring him here the [facility] ER....aom

## 2020-10-29 NOTE — H&P (Signed)
History and Physical        Hospital Admission Note Date: 10/29/2020  Patient name: Bruce Smith Medical record number: [id number] Date of birth: 29-Sep-1951 Age: 69 y.o. Gender: male  PCP: Derrill Center., MD    Chief Complaint    Chief Complaint  Patient presents with  . Dizziness      HPI:   This is a 69 year old male with past medical history of multiple myeloma, CAD and MI, hypercalcemia of malignancy, hypertension, hyperlipidemia with recent hospitalization from 10/27-10/28 for weakness, falling and poor p.o. intake and was discharged home with a walker, Decadron and Toradol who presented to the ED with poor balance for 1 day and decreased UOP x3 days.  Per patient's wife at bedside, the patient did not urinate prior to his discharge on 10/28 and has not urinated since that time.  He has also had some cancer related pain and so last night he was given home Lyrica and tramadol.  This morning he had poor balance both sitting and standing and the patient's wife had to repeatedly sit him upright in a chair.  She also noted that he had pinpoint pupils this a.m. which she states have improved and nearly resolved since that time.  States that he has a fentanyl patch as well, took as needed morphine yesterday morning and the tramadol last night.  He has also had some slurred speech this morning but attributed this to a dry mouth and currently has garbled speech but has not had difficulty finding the correct words.  The patient describes impaired right hand grip and paresthesias for the past day as well.  No fever, cough, shortness of breath or chest pain, vomiting or diarrhea.  Found to have BP 100/50 in route and was given IV fluids.  He denies any known prostate issues and has not had urinary retention in the past.   ED Course: Afebrile, hemodynamically stable, on room air. Notable Labs:  Sodium 134, K4.7, CO2 21, BUN 63, creatinine 3.7 (creatinine 0.65 on 12/28), WBC 10.8, Hb 10.4 (Hb 11.3 on 12/28). Notable Imaging: CT head without contrast without acute abnormality.  Bladder scan showed 823 cc.  Patient received 1 L NS bolus and Foley catheter.    Vitals:   10/29/20 1300 10/29/20 1330  BP:  (!) 144/67  Pulse: 64 66  Resp: 16 13  Temp:    SpO2: 97% 98%     Review of Systems:  Review of Systems  All other systems reviewed and are negative.   Medical/Social/Family History   Past Medical History: Past Medical History:  Diagnosis Date  . Atherosclerosis   . Coronary artery disease   . Goals of care, counseling/discussion 06/29/2020  . 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 Hyperlipidemia   . Hypertension   . 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 Multiple myeloma ([place]) 07/21/2020  . Myocardial infarction [facility])     Past Surgical History:  Procedure Laterality Date  . ANTERIOR CERVICAL DECOMP/DISCECTOMY FUSION N/A 06/17/2020   Procedure: ANTERIOR CERVICAL DECOMPRESSION/DISCECTOMY FUSION, INTERBODY PROSTHESIS, PLATE/SCREWS CERVICAL FIVE- CERVICAL SIX;  Surgeon: Newman Pies, MD;  Location: [place];  Service: Neurosurgery;  Laterality: N/A;  ANTERIOR CERVICAL DECOMPRESSION/DISCECTOMY  FUSION, INTERBODY PROSTHESIS, PLATE/SCREWS CERVICAL FIVE- CERVICAL SIX  . CARDIAC CATHETERIZATION    . FEMUR IM NAIL Left 07/14/2020   Procedure: OPEN REDUCTION INTERNAL FIXATION (ORIF LEFT FEMORAL NAIL FRACTURE;  Surgeon: Marchia Bond, MD;  Location: [place];  Service: Orthopedics;  Laterality: Left;    Medications: Prior to Admission medications   Medication Sig Start Date End Date Taking? Authorizing Provider  acetaminophen (TYLENOL) 500 MG tablet Take 1,000 mg by mouth every 6 (six) hours.    [provider]  aspirin 81 MG EC tablet Take 81 mg by mouth daily. Swallow whole.    [provider]  atorvastatin (LIPITOR) 40 MG tablet  TAKE 1/2 TABLET BY MOUTH DAILY FOR CHOLESTEROL 09/09/20   [provider]  Coenzyme Q10 (CO Q-10) 200 MG CAPS Take 200 mg by mouth daily.    [provider]  dexamethasone (DECADRON) 4 MG tablet Take 1 tablet (4 mg total) by mouth daily. Take for 3 days after chemotherapy treatment. Take with meals. 08/11/20   Volanda Napoleon, MD  famciclovir (FAMVIR) 500 MG tablet Take 1 tablet (500 mg total) by mouth daily. 08/11/20   Volanda Napoleon, MD  fentaNYL (DURAGESIC) 25 MCG/HR Place 1 patch onto the skin every 3 (three) days. 10/13/20   Volanda Napoleon, MD  LORazepam (ATIVAN) 0.5 MG tablet Take 1 tablet (0.5 mg total) by mouth every 6 (six) hours as needed for anxiety (nausea). 07/21/20   Volanda Napoleon, MD  megestrol (MEGACE ES) 625 MG/5ML suspension Take 5 mLs (625 mg total) by mouth daily. 10/28/20   Volanda Napoleon, MD  metoprolol succinate (TOPROL-XL) 50 MG 24 hr tablet Take 50 mg by mouth daily. 03/10/20   [provider]  morphine (MSIR) 15 MG tablet Take 1 tablet (15 mg total) by mouth every 6 (six) hours as needed for severe pain. 07/21/20   Volanda Napoleon, MD  naloxone [facility]) nasal spray 4 mg/0.1 mL Use as directed on the package. 10/14/20   Volanda Napoleon, MD  nitroGLYCERIN (NITROSTAT) 0.4 MG SL tablet Place 0.4 mg under the tongue every 5 (five) minutes as needed for chest pain.    [provider]  OLANZapine (ZYPREXA) 10 MG tablet TAKE 1 TABLET BY MOUTH EACH NIGHT AT BEDTIME 10/28/20   Volanda Napoleon, MD  omeprazole (PRILOSEC) 40 MG capsule Take 40 mg by mouth daily. 08/06/20 08/06/21  [provider]  ondansetron (ZOFRAN) 8 MG tablet Take 1 tablet (8 mg total) by mouth every 8 (eight) hours as needed for nausea or vomiting. 07/22/20   Gery Pray, MD  Polyethylene Glycol 3350 (MIRALAX PO) Take 2 Scoops by mouth daily.    [provider]  prasugrel (EFFIENT) 10 MG TABS tablet Take 10 mg by mouth daily. 05/12/20   [provider]  pregabalin (LYRICA) 100 MG capsule Take 1 capsule (100 mg total) by mouth 2 (two) times daily. 10/27/20   Bonnielee Haff, MD  prochlorperazine (COMPAZINE) 10 MG tablet Take 1 tablet (10 mg total) by mouth every 6 (six) hours as needed for nausea or vomiting. 07/28/20   Gery Pray, MD  sennosides-docusate sodium (SENOKOT-S) 8.6-50 MG tablet Take 2 tablets by mouth daily. Patient taking differently: Take 2 tablets by mouth daily as needed for constipation. 07/14/20   Ventura Bruns, PA-C  silver sulfADIAZINE (SILVADENE) 1 % cream Apply 1 application topically 2 (two) times daily. 10/06/20   Volanda Napoleon, MD  traMADol (ULTRAM) 50 MG  tablet Take 1 tablet (50 mg total) by mouth 3 (three) times daily as needed for moderate pain. 09/10/20   Gery Pray, MD  triamterene-hydrochlorothiazide (MAXZIDE) 75-50 MG tablet Take 1 tablet by mouth daily. 09/15/20   Volanda Napoleon, MD    Allergies:  No Known Allergies  Social History:  reports that he quit smoking about 22 years ago. His smokeless tobacco use includes chew. He reports that he does not use drugs. No history on file for alcohol use.  Family History: History reviewed. No pertinent family history.   Objective   Physical Exam: Blood pressure (!) 144/67, pulse 66, temperature 98.1 F (36.7 C), temperature source Oral, resp. rate 13, height '5\' 8"'  (1.727 m), weight 77.6 kg, SpO2 98 %.  Physical Exam Vitals and nursing note reviewed. Exam conducted with a chaperone present.  Constitutional:      General: He is not in acute distress. HENT:     Head: Normocephalic.     Mouth/Throat:     Mouth: Mucous membranes are dry.  Eyes:     Extraocular Movements: Extraocular movements intact.     Conjunctiva/sclera: Conjunctivae normal.     Pupils: Pupils are equal, round, and reactive to light.  Cardiovascular:     Rate and Rhythm: Normal rate and regular rhythm.  Pulmonary:     Effort: Pulmonary effort is normal.      Breath sounds: Normal breath sounds.  Abdominal:     General: Abdomen is flat.     Comments: Slightly distended with lower abdominal tenderness to palpation  Musculoskeletal:        General: No swelling or tenderness.     Cervical back: Normal range of motion.  Neurological:     Mental Status: He is alert.     Coordination: Coordination abnormal.     Comments: Paresthesias of right hand on palmar surface of all fingers Decreased grip strength of right hand compared to left Garbled speech Dysdiadochokinesia of hands Unremarkable heel-to-shin test bilaterally Unremarkable finger-to-nose bilaterally  Psychiatric:        Mood and Affect: Mood normal.        Behavior: Behavior normal.     LABS on Admission: I have personally reviewed all the labs and imaging below    Basic Metabolic Panel: Recent Labs  Lab 10/27/20 0258 10/29/20 1138  NA 136 134*  K 3.9 4.7  CL 104 103  CO2 24 21*  GLUCOSE 107* 96  BUN 16 63*  CREATININE 0.65 3.69*  CALCIUM 8.3* 7.3*   Liver Function Tests: Recent Labs  Lab 10/27/20 0258 10/29/20 1138  AST 11* 14*  ALT 16 16  ALKPHOS 122 103  BILITOT 0.8 0.8  PROT 5.5* 5.3*  ALBUMIN 3.3* 3.2*   No results for input(s): LIPASE, AMYLASE in the last 168 hours. No results for input(s): AMMONIA in the last 168 hours. CBC: Recent Labs  Lab 10/27/20 0258 10/29/20 1138  WBC 5.0 10.8*  NEUTROABS  --  9.6*  HGB 11.3* 10.4*  HCT 34.0* 31.7*  MCV 91.2 92.4  PLT 196 167   Cardiac Enzymes: Recent Labs  Lab 10/26/20 1236  CKTOTAL 27*   BNP: Invalid input(s): POCBNP CBG: Recent Labs  Lab 10/29/20 1028  GLUCAP 83    Radiological Exams on Admission:  CT Head Wo Contrast  Result Date: 10/29/2020 CLINICAL DATA:  Dizziness. Suspected stroke. Multiple myeloma. EXAM: CT HEAD WITHOUT CONTRAST TECHNIQUE: Contiguous axial images were obtained from the base of the skull through the vertex  without intravenous contrast. COMPARISON:  10/26/2020.  FINDINGS: Brain: Stable mild diffuse enlargement of the cortical sulci with normal size and position of the ventricles. No intracranial hemorrhage, mass lesion or CT evidence of acute infarction. Vascular: No hyperdense vessel or unexpected calcification. Skull: No significant change in multiple lytic lesions in the skull. Sinuses/Orbits: Unremarkable. Other: None. IMPRESSION: 1. No acute abnormality. 2. Stable mild diffuse cerebral atrophy. 3. No significant change in multiple lytic lesions in the skull, compatible with the patient's known multiple myeloma. Electronically Signed   By: Claudie Revering M.D.   On: 10/29/2020 11:48      EKG: Not done   A & P   Principal Problem:   AKI (acute kidney injury) ([place]) Active Problems:   Kappa light chain myeloma ([facility])   Abnormal neurological exam   Essential hypertension   CAD (coronary artery disease)   1. AKI a. Likely secondary to urinary retention/postobstructive, volume depletion/poor PO intake, Tramterene/HCTZ b. Bladder scan: 820 mL -Foley catheter placed c. Received 1 L NS bolus d. Follow-up in a.m. e. Renally dose medications -hold Lyrica f. Hold Triamterene-HCTZ  2. Abnormal neurologic exam, concern for TIA versus stroke and/or polypharmacy in the setting of AKI a. Neuro exam: Decreased grip strength of RUE, dysdiadochokinesia, garbled speech b. Polypharmacy: Morphine IR, fentanyl patch, Lyrica, tramadol c. CT head unremarkable d. Will proceed with TIA/stroke work-up: i. MRI brain-consider neurology consult pending results ii. Echo iii. EKG iv. Lipid panel v. HbA1c vi. Telemetry vii. PT eval viii. SLP eval e. Hold further morphine, Lyrica and tramadol, continue patch to prevent withdrawal  3. Concern for accidental opiate overdose, resolving a. Wife reported pinpoint pupils bilaterally this a.m. while in the dark which has nearly resolved b. Hold further morphine, Lyrica and tramadol, continue patch to prevent  withdrawal c. Hold off on reversal at this time  4. Multiple Myeloma a. Outpatient follow up  5. Hypertension a. Continue home Toprol XL b. Hold Maxzide  6. CAD a. Continue home meds    DVT prophylaxis: heparin   Code Status: Prior  Diet: NPO except sips with meds Family Communication: Admission, patients condition and plan of care including tests being ordered have been discussed with the patient who indicates understanding and agrees with the plan and Code Status. Patient's wife was updated  Disposition Plan: The appropriate patient status for this patient is INPATIENT. Inpatient status is judged to be reasonable and necessary in order to provide the required intensity of service to ensure the patient's safety. The patient's presenting symptoms, physical exam findings, and initial radiographic and laboratory data in the context of their chronic comorbidities is felt to place them at high risk for further clinical deterioration. Furthermore, it is not anticipated that the patient will be medically stable for discharge from the hospital within 2 midnights of admission. The following factors support the patient status of inpatient.   " The patient's presenting symptoms include poor balance, garbled speech. " The worrisome physical exam findings include garbled speech, poor grip strength. " The initial radiographic and laboratory data are worrisome because of AKI. " The chronic co-morbidities include multiple myeloma.   * I certify that at the point of admission it is my clinical judgment that the patient will require inpatient hospital care spanning beyond 2 midnights from the point of admission due to high intensity of service, high risk for further deterioration and high frequency of surveillance required.*       The medical decision making on this patient  was of high complexity and the patient is at high risk for clinical deterioration, therefore this is a level 3   admission.  Consultants  . none  Procedures  . none  Time Spent on Admission: 73 minutes    Harold Hedge, DO [facility]  10/29/2020, 2:54 PM

## 2020-10-29 NOTE — ED Triage Notes (Signed)
BIBA Per EMS:  Coming from home with complaints of dizziness and no urine production over the last 24HRs  Hx multiple mylomea  Last tx on 10/20/20  100/50 BP at 1st  1L fluid given by EMS  20G R AC  124/68 lying now 97% RA  105 CBG  70 HR

## 2020-10-29 NOTE — ED Notes (Addendum)
Per lab, swab to be resulted at 2316

## 2020-10-29 NOTE — ED Notes (Signed)
Pt attempted to get urine sample but states he is unable to do so at this moment. Pt states after he gets fluid bolus he will try again. Pt made aware of possible need to in and out cath.

## 2020-10-29 NOTE — ED Provider Notes (Signed)
[facility] EMERGENCY DEPARTMENT Provider Note  CSN: [id number] Arrival date & time: 10/29/20 1012    History Chief Complaint  Patient presents with  . Dizziness    HPI  Bruce Smith is a 69 y.o. male with history of myeloma was recently admitted to the hospital for weakness, falling and poor PO intake. He had extensive workup including labs and imaging of his brain and spine without definite cause. He was evaluated by PT/OT who recommended using a walker at home and Oncology who recommended adding decadron and toradol to his medications. He was discharged two days ago and has continued to have poor PO intake. He reports his pain is well controlled but he is now feeling dizzy. Not described as a room spinning or near syncope but he was unable to stand with his wife this morning. He has had decreased UOP the last 2 days as well. EMS was called and found his BP to be low so he was given IVF enroute to the ED. Patient reports some slurred speech which is new, he is unsure if it is from dry mouth or not. He has not had any fever, cough, SOB, CP vomiting or diarrhea. No other changes in his medications.    Past Medical History:  Diagnosis Date  . Atherosclerosis   . Coronary artery disease   . Goals of care, counseling/discussion 06/29/2020  . 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 Hyperlipidemia   . Hypertension   . 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 Multiple myeloma ([place]) 07/21/2020  . Myocardial infarction [facility])     Past Surgical History:  Procedure Laterality Date  . ANTERIOR CERVICAL DECOMP/DISCECTOMY FUSION N/A 06/17/2020   Procedure: ANTERIOR CERVICAL DECOMPRESSION/DISCECTOMY FUSION, INTERBODY PROSTHESIS, PLATE/SCREWS CERVICAL FIVE- CERVICAL SIX;  Surgeon: Newman Pies, MD;  Location: [place];  Service: Neurosurgery;  Laterality: N/A;  ANTERIOR CERVICAL DECOMPRESSION/DISCECTOMY FUSION, INTERBODY PROSTHESIS, PLATE/SCREWS CERVICAL FIVE- CERVICAL  SIX  . CARDIAC CATHETERIZATION    . FEMUR IM NAIL Left 07/14/2020   Procedure: OPEN REDUCTION INTERNAL FIXATION (ORIF LEFT FEMORAL NAIL FRACTURE;  Surgeon: Marchia Bond, MD;  Location: [place];  Service: Orthopedics;  Laterality: Left;    History reviewed. No pertinent family history.  Social History   Tobacco Use  . Smoking status: Former Smoker    Quit date: 2000    Years since quitting: 22.0  . Smokeless tobacco: Current User    Types: Chew  Vaping Use  . Vaping Use: Never used  Substance Use Topics  . Drug use: Never     Home Medications Prior to Admission medications   Medication Sig Start Date End Date Taking? Authorizing Provider  acetaminophen (TYLENOL) 500 MG tablet Take 1,000 mg by mouth every 6 (six) hours.   Yes [provider]  aspirin 81 MG EC tablet Take 81 mg by mouth daily. Swallow whole.   Yes [provider]  atorvastatin (LIPITOR) 40 MG tablet Take 20 mg by mouth daily. 09/09/20  Yes [provider]  Coenzyme Q10 (CO Q-10) 200 MG CAPS Take 200 mg by mouth daily.   Yes [provider]  dexamethasone (DECADRON) 4 MG tablet Take 1 tablet (4 mg total) by mouth daily. Take for 3 days after chemotherapy treatment. Take with meals. 08/11/20  Yes Volanda Napoleon, MD  famciclovir (FAMVIR) 500 MG tablet Take 1 tablet (500 mg total) by mouth daily. 08/11/20  Yes Volanda Napoleon, MD  fentaNYL (DURAGESIC) 25 MCG/HR Place 1 patch  onto the skin every 3 (three) days. 10/13/20  Yes Ennever, Rudell Cobb, MD  LORazepam (ATIVAN) 0.5 MG tablet Take 1 tablet (0.5 mg total) by mouth every 6 (six) hours as needed for anxiety (nausea). 07/21/20  Yes Volanda Napoleon, MD  megestrol (MEGACE ES) 625 MG/5ML suspension Take 5 mLs (625 mg total) by mouth daily. 10/28/20  Yes Volanda Napoleon, MD  metoprolol succinate (TOPROL-XL) 50 MG 24 hr tablet Take 50 mg by mouth daily. 03/10/20  Yes [provider]  morphine (MSIR) 15 MG tablet Take 1 tablet (15  mg total) by mouth every 6 (six) hours as needed for severe pain. 07/21/20  Yes Volanda Napoleon, MD  naloxone [facility]) nasal spray 4 mg/0.1 mL Use as directed on the package. 10/14/20  Yes Ennever, Rudell Cobb, MD  nitroGLYCERIN (NITROSTAT) 0.4 MG SL tablet Place 0.4 mg under the tongue every 5 (five) minutes as needed for chest pain.   Yes [provider]  omeprazole (PRILOSEC) 40 MG capsule Take 40 mg by mouth daily. 08/06/20 08/06/21 Yes [provider]  ondansetron (ZOFRAN) 8 MG tablet Take 1 tablet (8 mg total) by mouth every 8 (eight) hours as needed for nausea or vomiting. 07/22/20  Yes Gery Pray, MD  Polyethylene Glycol 3350 (MIRALAX PO) Take 2 Scoops by mouth daily as needed (constipation).   Yes [provider]  prasugrel (EFFIENT) 10 MG TABS tablet Take 10 mg by mouth daily. 05/12/20  Yes [provider]  pregabalin (LYRICA) 100 MG capsule Take 1 capsule (100 mg total) by mouth 2 (two) times daily. 10/27/20  Yes Bonnielee Haff, MD  prochlorperazine (COMPAZINE) 10 MG tablet Take 1 tablet (10 mg total) by mouth every 6 (six) hours as needed for nausea or vomiting. 07/28/20  Yes Gery Pray, MD  sennosides-docusate sodium (SENOKOT-S) 8.6-50 MG tablet Take 2 tablets by mouth daily. Patient taking differently: Take 2 tablets by mouth daily as needed for constipation. 07/14/20  Yes Merlene Pulling K, PA-C  silver sulfADIAZINE (SILVADENE) 1 % cream Apply 1 application topically 2 (two) times daily. Patient taking differently: Apply 1 application topically 2 (two) times daily as needed (bed sores). 10/06/20  Yes Ennever, Rudell Cobb, MD  traMADol (ULTRAM) 50 MG tablet Take 1 tablet (50 mg total) by mouth 3 (three) times daily as needed for moderate pain. 09/10/20  Yes Gery Pray, MD  triamterene-hydrochlorothiazide (MAXZIDE) 75-50 MG tablet Take 1 tablet by mouth daily. 09/15/20  Yes Volanda Napoleon, MD  OLANZapine (ZYPREXA) 10 MG tablet TAKE 1 TABLET BY MOUTH EACH  NIGHT AT BEDTIME Patient not taking: Reported on 10/29/2020 10/28/20   Volanda Napoleon, MD     Allergies    Patient has no known allergies.   Review of Systems   Review of Systems A comprehensive review of systems was completed and negative except as noted in HPI.    Physical Exam BP (!) 144/67   Pulse 66   Temp 98.1 F (36.7 C) (Oral)   Resp 13   Ht '5\' 8"'  (1.727 m)   Wt 77.6 kg   SpO2 98%   BMI 26.00 kg/m   Physical Exam Vitals and nursing note reviewed.  Constitutional:      Appearance: Normal appearance.  HENT:     Head: Normocephalic and atraumatic.     Nose: Nose normal.     Mouth/Throat:     Mouth: Mucous membranes are dry.  Eyes:     Extraocular Movements: Extraocular movements intact.  Conjunctiva/sclera: Conjunctivae normal.  Cardiovascular:     Rate and Rhythm: Normal rate.  Pulmonary:     Effort: Pulmonary effort is normal.     Breath sounds: Normal breath sounds.  Abdominal:     General: Abdomen is flat.     Palpations: Abdomen is soft.     Tenderness: There is no abdominal tenderness.  Musculoskeletal:        General: No swelling. Normal range of motion.     Cervical back: Neck supple.  Skin:    General: Skin is warm and dry.  Neurological:     Mental Status: He is alert.     Comments: Mild dysarthria, grip on the R hand is mildly decreased strength compared to the L. Normal strength elsewhere. Normal sensation. Normal finger-to-nose and heel-to-shin.   Psychiatric:        Mood and Affect: Mood normal.      ED Results / Procedures / Treatments   Labs (all labs ordered are listed, but only abnormal results are displayed) Labs Reviewed  COMPREHENSIVE METABOLIC PANEL - Abnormal; Notable for the following components:      Result Value   Sodium 134 (*)    CO2 21 (*)    BUN 63 (*)    Creatinine, Ser 3.69 (*)    Calcium 7.3 (*)    Total Protein 5.3 (*)    Albumin 3.2 (*)    AST 14 (*)    GFR, Estimated 17 (*)    All other  components within normal limits  CBC WITH DIFFERENTIAL/PLATELET - Abnormal; Notable for the following components:   WBC 10.8 (*)    RBC 3.43 (*)    Hemoglobin 10.4 (*)    HCT 31.7 (*)    RDW 16.8 (*)    Neutro Abs 9.6 (*)    Lymphs Abs 0.2 (*)    All other components within normal limits  SARS CORONAVIRUS 2 (TAT 6-24 HRS)  URINALYSIS, ROUTINE W REFLEX MICROSCOPIC  CBG MONITORING, ED    EKG None  Radiology CT Head Wo Contrast  Result Date: 10/29/2020 CLINICAL DATA:  Dizziness. Suspected stroke. Multiple myeloma. EXAM: CT HEAD WITHOUT CONTRAST TECHNIQUE: Contiguous axial images were obtained from the base of the skull through the vertex without intravenous contrast. COMPARISON:  10/26/2020. FINDINGS: Brain: Stable mild diffuse enlargement of the cortical sulci with normal size and position of the ventricles. No intracranial hemorrhage, mass lesion or CT evidence of acute infarction. Vascular: No hyperdense vessel or unexpected calcification. Skull: No significant change in multiple lytic lesions in the skull. Sinuses/Orbits: Unremarkable. Other: None. IMPRESSION: 1. No acute abnormality. 2. Stable mild diffuse cerebral atrophy. 3. No significant change in multiple lytic lesions in the skull, compatible with the patient's known multiple myeloma. Electronically Signed   By: Claudie Revering M.D.   On: 10/29/2020 11:48    Procedures Procedures  Medications Ordered in the ED Medications  sodium chloride 0.9 % bolus 1,000 mL (0 mLs Intravenous Stopped 10/29/20 1253)     MDM Rules/Calculators/A&P MDM Patient with signs of dehydration may be making him feel dizzy. Also has mild dysarthria and R hand weakness he thinks are new. Will send for CT head as well as recheck labs and give additional IVF. He reports his pain is currently well controlled.  ED Course  I have reviewed the triage vital signs and the nursing notes.  Pertinent labs & imaging results that were available during my care of  the patient were reviewed by me and  considered in my medical decision making (see chart for details).  Clinical Course as of 10/29/20 1342  Thu Oct 29, 2020  1150 CBC is unremarkable [CS]  1217 CMP confirms significant dehydration with Creatinine up over 3 from baseline of normal. Will plan re-admission for hydration.  [CS]  1254 Patient has had no urine output in the ED and feels like he 'could pee any time'. He has >600cc on bladder scan so outlet obstruction is a concern as well. Will ask RN to place a foley. Hospitalist paged.  [CS]  4847 Spoke with Dr. Neysa Bonito, Hospitalist, who will evaluate for admission.  [CS]    Clinical Course User Index [CS] Truddie Hidden, MD    Final Clinical Impression(s) / ED Diagnoses Final diagnoses:  AKI (acute kidney injury) [facility])  Urinary retention    Rx / DC Orders ED Discharge Orders    None       Truddie Hidden, MD 10/29/20 1342

## 2020-10-29 NOTE — ED Notes (Signed)
Fall bundle: Yellow socks Bed alarm on Yellow fall risk armband  Fall risk sign outside door

## 2020-10-29 NOTE — ED Notes (Signed)
Bladder scanner showed in bladder 1st time scanned. shown on the 2nd scan.

## 2020-10-30 ENCOUNTER — Inpatient Hospital Stay (HOSPITAL_COMMUNITY): Payer: BC Managed Care – PPO

## 2020-10-30 DIAGNOSIS — I6389 Other cerebral infarction: Secondary | ICD-10-CM

## 2020-10-30 DIAGNOSIS — N179 Acute kidney failure, unspecified: Secondary | ICD-10-CM | POA: Diagnosis not present

## 2020-10-30 LAB — ECHOCARDIOGRAM COMPLETE
Area-P 1/2: 3.72 cm2
Calc EF: 55.2 %
Height: 68 in
S' Lateral: 3.4 cm
Single Plane A2C EF: 57.4 %
Single Plane A4C EF: 52.8 %
Weight: 2736 oz

## 2020-10-30 LAB — BASIC METABOLIC PANEL
Anion gap: 9 (ref 5–15)
BUN: 63 mg/dL — ABNORMAL HIGH (ref 8–23)
CO2: 22 mmol/L (ref 22–32)
Calcium: 6.8 mg/dL — ABNORMAL LOW (ref 8.9–10.3)
Chloride: 108 mmol/L (ref 98–111)
Creatinine, Ser: 3.3 mg/dL — ABNORMAL HIGH (ref 0.61–1.24)
GFR, Estimated: 19 mL/min — ABNORMAL LOW (ref >60)
Glucose, Bld: 98 mg/dL (ref 70–99)
Potassium: 4.8 mmol/L (ref 3.5–5.1)
Sodium: 139 mmol/L (ref 135–145)

## 2020-10-30 LAB — CBC
HCT: 31.7 % — ABNORMAL LOW (ref 39.0–52.0)
Hemoglobin: 10.4 g/dL — ABNORMAL LOW (ref 13.0–17.0)
MCH: 30.1 pg (ref 26.0–34.0)
MCHC: 32.8 g/dL (ref 30.0–36.0)
MCV: 91.9 fL (ref 80.0–100.0)
Platelets: 163 10*3/uL (ref 150–400)
RBC: 3.45 MIL/uL — ABNORMAL LOW (ref 4.22–5.81)
RDW: 17 % — ABNORMAL HIGH (ref 11.5–15.5)
WBC: 9.1 10*3/uL (ref 4.0–10.5)
nRBC: 0 % (ref 0.0–0.2)

## 2020-10-30 LAB — HEMOGLOBIN A1C
Hgb A1c MFr Bld: 5.4 % (ref 4.8–5.6)
Mean Plasma Glucose: 108.28 mg/dL

## 2020-10-30 LAB — LIPID PANEL
Cholesterol: 93 mg/dL (ref 0–200)
HDL: 28 mg/dL — ABNORMAL LOW (ref >40)
LDL Cholesterol: 32 mg/dL (ref 0–99)
Total CHOL/HDL Ratio: 3.3 RATIO
Triglycerides: 167 mg/dL — ABNORMAL HIGH (ref <150)
VLDL: 33 mg/dL (ref 0–40)

## 2020-10-30 MED ORDER — HYDROMORPHONE HCL 2 MG PO TABS
1.0000 mg | ORAL_TABLET | Freq: Two times a day (BID) | ORAL | Status: DC | PRN
  Administered 2020-10-30 – 2020-10-31 (×2): 1 mg via ORAL
  Filled 2020-10-30 (×2): qty 1

## 2020-10-30 MED ORDER — CHLORHEXIDINE GLUCONATE CLOTH 2 % EX PADS
6.0000 | MEDICATED_PAD | Freq: Every day | CUTANEOUS | Status: DC
  Administered 2020-10-30 – 2020-11-01 (×3): 6 via TOPICAL

## 2020-10-30 MED ORDER — SODIUM CHLORIDE 0.9 % IV SOLN: INTRAVENOUS | Status: DC

## 2020-10-30 NOTE — Consult Note (Signed)
Referral MD  Reason for Referral: Acute renal failure-possible obstruction; kappa light chain myeloma  Chief Complaint  Patient presents with  . Dizziness  : I cannot urinate.  HPI: Mr. Egolf is well-known to me.  He is a 69 year old white male.  He has Kappa light chain myeloma.  He has been on treatment with daratumumab/Velcade/Revlimid.  We held the Revlimid a couple weeks ago because a side effect with nausea.  He is in the hospital last week because of pain.  He had extensive studies.  One of the studies with MRI of the lumbar spine which did not show any cord involvement.  He went home.  His wife called yesterday saying that he cannot urinate.  He subsequently went to the emergency room.  He was found to have a elevated BUN and creatinine of 63 and 3.6.  He was felt to be dehydrated.  He IV fluid.  He had a Foley catheter placed.  He was found to have 820 cc of urine in his bladder.  He says he does not have any problems with pain.  He is not having any nausea or vomiting.  This morning, his BUN 63 creatinine 3.3.  His calcium is 6.8.  I know that he has had problems with hypocalcemia in the past.  His CBC shows a white cell count 9.1.  Hemoglobin 10.4.  Platelet count 163,000.  He has had no fever.  There is no bleeding.  When he came in, his urine was not infected.  Overall, performance status is ECOG 1.    Past Medical History:  Diagnosis Date  . Atherosclerosis   . Coronary artery disease   . Goals of care, counseling/discussion 06/29/2020  . 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 Hyperlipidemia   . Hypertension   . 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 Multiple myeloma ([place]) 07/21/2020  . Myocardial infarction [facility])   :  Past Surgical History:  Procedure Laterality Date  . ANTERIOR CERVICAL DECOMP/DISCECTOMY FUSION N/A 06/17/2020   Procedure: ANTERIOR CERVICAL DECOMPRESSION/DISCECTOMY FUSION, INTERBODY PROSTHESIS, PLATE/SCREWS  CERVICAL FIVE- CERVICAL SIX;  Surgeon: Newman Pies, MD;  Location: [place];  Service: Neurosurgery;  Laterality: N/A;  ANTERIOR CERVICAL DECOMPRESSION/DISCECTOMY FUSION, INTERBODY PROSTHESIS, PLATE/SCREWS CERVICAL FIVE- CERVICAL SIX  . CARDIAC CATHETERIZATION    . FEMUR IM NAIL Left 07/14/2020   Procedure: OPEN REDUCTION INTERNAL FIXATION (ORIF LEFT FEMORAL NAIL FRACTURE;  Surgeon: Marchia Bond, MD;  Location: [place];  Service: Orthopedics;  Laterality: Left;  :   Current Facility-Administered Medications:  .   stroke: mapping our early stages of recovery book, , Does not apply, Once, Harold Hedge, MD .  acetaminophen (TYLENOL) tablet 650 mg, 650 mg, Oral, Q6H PRN, 650 mg at 10/29/20 2244 **OR** acetaminophen (TYLENOL) suppository 650 mg, 650 mg, Rectal, Q6H PRN, Harold Hedge, MD .  aspirin EC tablet 81 mg, 81 mg, Oral, Daily, Marva Panda E, MD .  atorvastatin (LIPITOR) tablet 20 mg, 20 mg, Oral, Daily, Harold Hedge, MD, 20 mg at 10/29/20 2245 .  [START ON 10/31/2020] fentaNYL (DURAGESIC) 25 MCG/HR 1 patch, 1 patch, Transdermal, Q72H, Harold Hedge, MD .  heparin injection 5,000 Units, 5,000 Units, Subcutaneous, Q8H, Harold Hedge, MD, 5,000 Units at 10/30/20 [phone] .  LORazepam (ATIVAN) tablet 0.5 mg, 0.5 mg, Oral, Q6H PRN, Harold Hedge, MD, 0.5 mg at 10/29/20 2245 .  megestrol (MEGACE) 400 MG/10ML suspension 625 mg, 625 mg, Oral, Daily, Harold Hedge, MD,  625 mg at 10/29/20 2248 .  metoprolol succinate (TOPROL-XL) 24 hr tablet 50 mg, 50 mg, Oral, Daily, Jae Dire, MD, 50 mg at 10/29/20 2244 .  nitroGLYCERIN (NITROSTAT) SL tablet 0.4 mg, 0.4 mg, Sublingual, Q5 min PRN, Jae Dire, MD .  pantoprazole (PROTONIX) EC tablet 40 mg, 40 mg, Oral, Daily, Jae Dire, MD, 40 mg at 10/29/20 2244 .  polyethylene glycol (MIRALAX / GLYCOLAX) packet 17 g, 17 g, Oral, Daily PRN, Jae Dire, MD .  prasugrel (EFFIENT) tablet 10 mg, 10 mg, Oral, Daily, Jae Dire, MD, 10 mg at  10/29/20 2248 .  senna-docusate (Senokot-S) tablet 2 tablet, 2 tablet, Oral, Daily PRN, Whitney Post E, MD .  sodium chloride flush (NS) 0.9 % injection 3 mL, 3 mL, Intravenous, Q12H, Jae Dire, MD, 3 mL at 10/29/20 2250 .  valACYclovir (VALTREX) tablet 1,000 mg, 1,000 mg, Oral, Daily, Jae Dire, MD, 1,000 mg at 10/29/20 2247:  .  stroke: mapping our early stages of recovery book   Does not apply Once  . aspirin EC  81 mg Oral Daily  . atorvastatin  20 mg Oral Daily  . [START ON 10/31/2020] fentaNYL  1 patch Transdermal Q72H  . heparin  5,000 Units Subcutaneous Q8H  . megestrol  625 mg Oral Daily  . metoprolol succinate  50 mg Oral Daily  . pantoprazole  40 mg Oral Daily  . prasugrel  10 mg Oral Daily  . sodium chloride flush  3 mL Intravenous Q12H  . valACYclovir  1,000 mg Oral Daily  :  No Known Allergies:  History reviewed. No pertinent family history.:  Social History   Socioeconomic History  . Marital status: Married    Spouse name: Not on file  . Number of children: Not on file  . Years of education: Not on file  . Highest education level: Not on file  Occupational History  . Not on file  Tobacco Use  . Smoking status: Former Smoker    Quit date: 2000    Years since quitting: 22.0  . Smokeless tobacco: Current User    Types: Chew  Vaping Use  . Vaping Use: Never used  Substance and Sexual Activity  . Alcohol use: Not on file    Comment: rare  . Drug use: Never  . Sexual activity: Not on file  Other Topics Concern  . Not on file  Social History Narrative  . Not on file   Social Determinants of Health   Financial Resource Strain: Not on file  Food Insecurity: Not on file  Transportation Needs: Not on file  Physical Activity: Not on file  Stress: Not on file  Social Connections: Not on file  Intimate Partner Violence: Not on file  :  Review of Systems  Constitutional: Positive for malaise/fatigue.  HENT: Negative.   Genitourinary: Positive  for flank pain.  Musculoskeletal: Positive for back pain, falls and joint pain.  Skin: Negative.   Neurological: Negative.   Endo/Heme/Allergies: Negative.   Psychiatric/Behavioral: Negative.      Exam: Unremarkable Patient Vitals for the past 24 hrs:  BP Temp Temp src Pulse Resp SpO2 Height Weight  10/30/20 0352 (!) 108/51 (!) 97.4 F (36.3 C) Oral (!) 59 18 95 % -- --  10/29/20 2353 (!) 113/46 98 F (36.7 C) Oral 60 18 98 % -- --  10/29/20 2328 (!) 102/51 98.1 F (36.7 C) Oral 65 16 97 % -- --  10/29/20 2300 Marland Kitchen)  121/57 -- -- 64 20 96 % -- --  10/29/20 2200 (!) 108/56 -- -- 65 -- 96 % -- --  10/29/20 2008 (!) 123/58 98.2 F (36.8 C) Oral 62 20 99 % -- --  10/29/20 1700 (!) 131/96 -- -- 60 15 98 % -- --  10/29/20 1645 -- -- -- 65 20 99 % -- --  10/29/20 1630 -- -- -- 61 15 96 % -- --  10/29/20 1615 -- -- -- 61 15 95 % -- --  10/29/20 1600 -- -- -- 60 14 98 % -- --  10/29/20 1545 -- -- -- 67 19 92 % -- --  10/29/20 1530 -- -- -- 61 16 96 % -- --  10/29/20 1515 -- -- -- 62 15 96 % -- --  10/29/20 1500 (!) 113/103 -- -- (!) 120 16 99 % -- --  10/29/20 1445 -- -- -- (!) 51 15 93 % -- --  10/29/20 1430 98/68 -- -- 61 14 96 % -- --  10/29/20 1415 -- -- -- 63 18 98 % -- --  10/29/20 1400 118/60 -- -- 63 14 96 % -- --  10/29/20 1345 -- -- -- 63 11 98 % -- --  10/29/20 1330 (!) 144/67 -- -- 66 13 98 % -- --  10/29/20 1315 -- -- -- 82 19 96 % -- --  10/29/20 1300 (!) 36/25 -- -- 64 16 97 % -- --  10/29/20 1245 -- -- -- 64 13 99 % -- --  10/29/20 1230 125/88 -- -- 63 12 96 % -- --  10/29/20 1215 -- -- -- 69 14 96 % -- --  10/29/20 1200 (!) 152/63 -- -- 64 11 97 % -- --  10/29/20 1145 -- -- -- 63 12 98 % -- --  10/29/20 1134 120/60 -- -- 62 17 94 % -- --  10/29/20 1115 -- -- -- 63 12 97 % -- --  10/29/20 1100 120/60 -- -- 65 16 98 % -- --  10/29/20 1045 -- -- -- 68 18 100 % -- --  10/29/20 1030 123/63 -- -- 69 11 98 % -- --  10/29/20 1029 -- -- -- -- -- -- $Rem'5\' 8"'NYrp$  (1.727 m) 171 lb  (77.6 kg)  10/29/20 1024 103/86 98.1 F (36.7 C) Oral 68 12 98 % -- --     Recent Labs    10/29/20 1138 10/30/20 0614  WBC 10.8* 9.1  HGB 10.4* 10.4*  HCT 31.7* 31.7*  PLT 167 163   Recent Labs    10/29/20 1138 10/30/20 0614  NA 134* 139  K 4.7 4.8  CL 103 108  CO2 21* 22  GLUCOSE 96 98  BUN 63* 63*  CREATININE 3.69* 3.30*  CALCIUM 7.3* 6.8*    Blood smear review: None  Pathology: None    Assessment and Plan: Mr. Lezcano is a 69 year old white male.  He has Kappa light chain myeloma.  He has had a very good response by all of our studies.  He had a recent bone marrow biopsy which did not show any obvious myeloma.  His light chains in the urine and blood have all pretty much normalized.  I am unsure as to why he would have the renal failure.  I suppose dehydration.  If that were the case, I will think he would be on IV fluids.  I cannot see any medicine that he was on.  I think he may be on some diuretics.  He really  has had a tough time with this myeloma.  He has had a tough time with treatment.  Again he is responded.  I have spoken with the transplant doctors at [facility] and they will take him for transplant.  I just do not know if he is in shape for this.  Hopefully, his renal function will improve.  Again, I would think he would need some IV fluid.  I know that he will get outstanding care by all staff upon 6 E.  I appreciate all their help.  Lattie Haw, MD  Vonna Kotyk 23:8

## 2020-10-30 NOTE — Evaluation (Signed)
Physical Therapy Evaluation Patient Details Name: Bruce Smith MRN: [id number] DOB: 10-11-1951 Today's Date: 10/30/2020   History of Present Illness  This is a 69 year old male with past medical history of multiple myeloma, CAD and MI, hypercalcemia of malignancy, hypertension, hyperlipidemia with recent hospitalization from 10/27-10/28 for weakness, falling and poor p.o. intake and was discharged home with a walker. Now back to ED with complaints of dizziness, slurred speech and impaired right hand grip. Patient admitted for possible TIA vs stroke vs polypharmacy and Acute Kidney injury.  Clinical Impression  Pt admitted as above and presenting with functional mobility limitations 2* generalized weakness, balance deficits, poor endurance, L LE pain with activity, and orthostatic hypotension.  Pt hopes to progress to dc home with family assist.    Follow Up Recommendations Home health PT;Supervision for mobility/OOB    Equipment Recommendations  None recommended by PT    Recommendations for Other Services       Precautions / Restrictions Precautions Precautions: Fall Precaution Comments: Hx of falls and orthostatic hypotension. Restrictions Weight Bearing Restrictions: No      Mobility  Bed Mobility Overal bed mobility: Needs Assistance Bed Mobility: Supine to Sit     Supine to sit: Min assist;HOB elevated          Transfers Overall transfer level: Needs assistance Equipment used: Rolling walker (2 wheeled) Transfers: Sit to/from [facility] Sit to Stand: Min assist;+2 safety/equipment Stand pivot transfers: Min assist       General transfer comment: Min assist for steadyng. Reports of pain in LLE and dizziness with standing. Patient has a history of orthostatic hypotension. BP in sitting 108/61 and dropped to 78/55.  Ambulation/Gait Ambulation/Gait assistance: Min assist;+2 safety/equipment Gait Distance (Feet): 3 Feet Assistive device: Rolling  walker (2 wheeled) Gait Pattern/deviations: Step-to pattern;Shuffle Gait velocity: decr   General Gait Details: Distance ltd by dizziness with move to standing.  Assist for balance/support and RW management  Stairs            Wheelchair Mobility    Modified Rankin (Stroke Patients Only)       Balance Overall balance assessment: Needs assistance Sitting-balance support: Feet supported;Single extremity supported Sitting balance-Leahy Scale: Poor Sitting balance - Comments: Requires external assistance to maintain balance.   Standing balance support: Bilateral upper extremity supported Standing balance-Leahy Scale: Poor Standing balance comment: reliant on UEs and external assist for dynamic tasks                             Pertinent Vitals/Pain Pain Assessment: Faces Faces Pain Scale: Hurts little more Pain Location: left leg Pain Descriptors / Indicators: Grimacing;Guarding;Aching Pain Intervention(s): Limited activity within patient's tolerance;Monitored during session    Home Living Family/patient expects to be discharged to:: Private residence Living Arrangements: Spouse/significant other Available Help at Discharge: Family;Available 24 hours/day Type of Home: House Home Access: Stairs to enter Entrance Stairs-Rails:  (one rail) Technical brewer of Steps: 2 Home Layout: Two level;Able to live on main level with bedroom/bathroom Home Equipment: Shower seat - built in;Hand held Tourist information centre manager - 4 wheels Additional Comments: Has an adjustable bed    Prior Function Level of Independence: Needs assistance   Gait / Transfers Assistance Needed: ambulating with walker in home  ADL's / Homemaking Assistance Needed: assistance with dressing and bathing  Comments: amb with rollator     Hand Dominance   Dominant Hand: Right    Extremity/Trunk Assessment   Upper Extremity  Assessment Upper Extremity Assessment: Overall WFL for tasks  assessed;Defer to OT evaluation RUE Deficits / Details: 5/5 strength in shoulder, 4/5 elbow strength, 5/5 wrist, grip 4+/5 RUE Sensation:  (reports numbness in tips of fingers in right hand) RUE Coordination: WNL (Able to perform finger to thumb and finger to nose functionally.) LUE Deficits / Details: 5/5 strength in shoulder, 4/5 elbow strength, 5/5 wrist, grip 4=/5 LUE Sensation: WNL LUE Coordination: WNL (Able to perform finger to thumb and finger to nose functionally.)    Lower Extremity Assessment Lower Extremity Assessment: Generalized weakness;LLE deficits/detail LLE Deficits / Details: ankle grossly WFL, knee ~ 3+/5, hip ~4/5    Cervical / Trunk Assessment Cervical / Trunk Assessment: Normal  Communication   Communication: No difficulties  Cognition Arousal/Alertness: Awake/alert Behavior During Therapy: WFL for tasks assessed/performed Overall Cognitive Status: Within Functional Limits for tasks assessed                                        General Comments      Exercises     Assessment/Plan    PT Assessment Patient needs continued PT services  PT Problem List Decreased strength;Decreased range of motion;Decreased activity tolerance;Decreased balance;Decreased mobility;Decreased knowledge of use of DME;Pain       PT Treatment Interventions DME instruction;Gait training;Stair training;Functional mobility training;Therapeutic activities;Therapeutic exercise;Balance training;Patient/family education    PT Goals (Current goals can be found in the Care Plan section)  Acute Rehab PT Goals Patient Stated Goal: Be able to walk safely PT Goal Formulation: All assessment and education complete, DC therapy Time For Goal Achievement: 11/13/20 Potential to Achieve Goals: Fair    Frequency Min 3X/week   Barriers to discharge        Co-evaluation PT/OT/SLP Co-Evaluation/Treatment: Yes Reason for Co-Treatment: For patient/therapist safety PT goals  addressed during session: Mobility/safety with mobility OT goals addressed during session: ADL's and self-care       AM-PAC PT "6 Clicks" Mobility  Outcome Measure Help needed turning from your back to your side while in a flat bed without using bedrails?: None Help needed moving from lying on your back to sitting on the side of a flat bed without using bedrails?: A Little Help needed moving to and from a bed to a chair (including a wheelchair)?: A Lot Help needed standing up from a chair using your arms (e.g., wheelchair or bedside chair)?: A Lot Help needed to walk in hospital room?: A Lot Help needed climbing 3-5 steps with a railing? : A Lot 6 Click Score: 15    End of Session Equipment Utilized During Treatment: Gait belt Activity Tolerance: Patient tolerated treatment well;Patient limited by fatigue Patient left: with call bell/phone within reach;with family/visitor present;in chair Nurse Communication: Mobility status PT Visit Diagnosis: Other abnormalities of gait and mobility (R26.89);Muscle weakness (generalized) (M62.81)    Time: 3212-2482 PT Time Calculation (min) (ACUTE ONLY): 26 min   Charges:   PT Evaluation $PT Eval Low Complexity: 1 Low          [place] Pager [phone] Office [phone]   Elijah Phommachanh 10/30/2020, 1:10 PM

## 2020-10-30 NOTE — Evaluation (Signed)
Occupational Therapy Evaluation Patient Details Name: Bruce Smith MRN: [id number] DOB: [birth year] Today's Date: 10/30/2020    History of Present Illness This is a 69 year old male with past medical history of multiple myeloma, CAD and MI, hypercalcemia of malignancy, hypertension, hyperlipidemia with recent hospitalization from 10/27-10/28 for weakness, falling and poor p.o. intake and was discharged home with a walker. Now back to ED with complaints of dizziness, slurred speech and impaired right hand grip. Patient admitted for possible TIA vs stroke vs polypharmacy and Acute Kidney injury.   Clinical Impression   Bruce Smith is a 69 year old with above medical history who presents with decreased sitting balance, decreased standing balance, complaints of left leg pain, generalized weakness, decreased activity tolerance and orthostatic hypotension resulting in a significant decline in patient's baseline ability to perform functional mobility and ADLs. Patient requiring min assist for bed transfers and standing and +2 for safety. Patient unable to maintain sitting balance at side of bed and holding on with one hand or needing external support of therapist. With standing patient held onto walker and complaining of dizziness and unable to maintain standing to complete full blood pressure monitoring. BP 78/55 after standing. Patient able to take steps with +2 for safety to transfer into recliner. Patient reporting his right arm feels weaker and paraesthesia in fingers of right. No significant strength difference noted with MMT compared to left. Patient will benefit from skilled OT services while in hospital to improve deficits and learn compensatory strategies as needed in order to return home at discharge. Expect patient should improve enough to return home with spouse.       Follow Up Recommendations  Home health OT    Equipment Recommendations  None recommended by OT    Recommendations for  Other Services       Precautions / Restrictions Precautions Precautions: Fall Precaution Comments: Hx of falls and orthostatic hypotension. Restrictions Weight Bearing Restrictions: No      Mobility Bed Mobility Overal bed mobility: Needs Assistance Bed Mobility: Supine to Sit     Supine to sit: Min assist;HOB elevated          Transfers Overall transfer level: Needs assistance Equipment used: Rolling walker (2 wheeled) Transfers: Sit to/from [facility] Sit to Stand: Min assist;+2 safety/equipment Stand pivot transfers: Min assist       General transfer comment: Min assist for steadyng. Reports of pain in LLE and dizziness with standing. Patient has a history of orthostatic hypotension. BP in sitting 108/61 and dropped to 78/55.    Balance Overall balance assessment: Needs assistance Sitting-balance support: Feet supported;Single extremity supported Sitting balance-Leahy Scale: Poor Sitting balance - Comments: Requires external assistance to maintain balance.   Standing balance support: Bilateral upper extremity supported Standing balance-Leahy Scale: Poor                             ADL either performed or assessed with clinical judgement   ADL Overall ADL's : Needs assistance/impaired Eating/Feeding: Set up   Grooming: Set up;Min guard Grooming Details (indicate cue type and reason): set up in seated position with back supported. Upper Body Bathing: Set up;Min guard Upper Body Bathing Details (indicate cue type and reason): set up in seated position with back supported. Lower Body Bathing: Minimal assistance;Sit to/from stand   Upper Body Dressing : Minimal assistance;Sitting Upper Body Dressing Details (indicate cue type and reason): set up in seated position with back supported.  Lower Body Dressing: Set up;Minimal assistance;Sitting/lateral leans;Sit to/from stand   Toilet Transfer: BSC;Stand-pivot;RW;Minimal assistance;+2  for safety/equipment Toilet Transfer Details (indicate cue type and reason): as demonstrated by transfer to reclnier/ +2 for safety due to orthostatic and poor balance Toileting- Clothing Manipulation and Hygiene: Minimal assistance;Sit to/from stand       Functional mobility during ADLs: Minimal assistance;Rolling walker General ADL Comments: Patient exhibiting poor sitting balance and needs min assist to maintain balance at edge of bed - requiring assistance for all seated ADLs.     Vision Baseline Vision/History: Wears glasses Patient Visual Report: No change from baseline       Perception     Praxis      Pertinent Vitals/Pain Pain Assessment: Faces Faces Pain Scale: Hurts little more Pain Location: left leg Pain Descriptors / Indicators: Grimacing;Guarding;Aching Pain Intervention(s): Monitored during session     Hand Dominance Right   Extremity/Trunk Assessment Upper Extremity Assessment Upper Extremity Assessment: Overall WFL for tasks assessed;RUE deficits/detail;LUE deficits/detail RUE Deficits / Details: 5/5 strength in shoulder, 4/5 elbow strength, 5/5 wrist, grip 4+/5 RUE Sensation:  (reports numbness in tips of fingers in right hand) RUE Coordination: WNL (Able to perform finger to thumb and finger to nose functionally.) LUE Deficits / Details: 5/5 strength in shoulder, 4/5 elbow strength, 5/5 wrist, grip 4=/5 LUE Sensation: WNL LUE Coordination: WNL (Able to perform finger to thumb and finger to nose functionally.)   Lower Extremity Assessment Lower Extremity Assessment: Defer to PT evaluation   Cervical / Trunk Assessment Cervical / Trunk Assessment: Normal   Communication Communication Communication: No difficulties   Cognition Arousal/Alertness: Awake/alert Behavior During Therapy: WFL for tasks assessed/performed Overall Cognitive Status: Within Functional Limits for tasks assessed                                     General  Comments       Exercises     Shoulder Instructions      Home Living Family/patient expects to be discharged to:: Private residence Living Arrangements: Spouse/significant other Available Help at Discharge: Family;Available 24 hours/day Type of Home: House Home Access: Stairs to enter [employer] of Steps: 2 Entrance Stairs-Rails:  (one rail) Home Layout: Two level;Able to live on main level with bedroom/bathroom               Home Equipment: Shower seat - built in;Hand held Tourist information centre manager - 4 wheels          Prior Functioning/Environment Level of Independence: Needs assistance  Gait / Transfers Assistance Needed: ambulating with walker in home ADL's / Homemaking Assistance Needed: assistance with dressing and bathing   Comments: amb with rollator        OT Problem List: Decreased activity tolerance;Impaired balance (sitting and/or standing);Decreased safety awareness;Decreased knowledge of use of DME or AE;Pain;Impaired sensation      OT Treatment/Interventions: Self-care/ADL training;DME and/or AE instruction;Energy conservation;Therapeutic activities;Patient/family education;Balance training;Therapeutic exercise;Neuromuscular education    OT Goals(Current goals can be found in the care plan section) Acute Rehab OT Goals Patient Stated Goal: Be able to walk safely OT Goal Formulation: With patient/family Time For Goal Achievement: 11/13/20 Potential to Achieve Goals: Good  OT Frequency: Min 2X/week   Barriers to D/C:            Co-evaluation PT/OT/SLP Co-Evaluation/Treatment: Yes Reason for Co-Treatment: For patient/therapist safety;To address functional/ADL transfers PT goals addressed during session: Mobility/safety with  mobility OT goals addressed during session: ADL's and self-care      AM-PAC OT "6 Clicks" Daily Activity     Outcome Measure Help from another person eating meals?: A Little Help from another person taking care of  personal grooming?: A Little Help from another person toileting, which includes using toliet, bedpan, or urinal?: A Little Help from another person bathing (including washing, rinsing, drying)?: A Little Help from another person to put on and taking off regular upper body clothing?: A Little Help from another person to put on and taking off regular lower body clothing?: A Little 6 Click Score: 18   End of Session Equipment Utilized During Treatment: Gait belt;Rolling walker Nurse Communication: Mobility status  Activity Tolerance: Patient tolerated treatment well Patient left: Other (comment);with call bell/phone within reach;with family/visitor present;with chair alarm set (Notified Nurse TEch that chair alarm needs new battery.)  OT Visit Diagnosis: Repeated falls (R29.6);Pain;Dizziness and giddiness (R42);Other symptoms and signs involving the nervous system (R29.898);History of falling (Z91.81);Unsteadiness on feet (R26.81) Pain - Right/Left: Left Pain - part of body: Leg                Time: 6314-9702 OT Time Calculation (min): 23 min Charges:  OT General Charges $OT Visit: 1 Visit OT Evaluation $OT Eval Moderate Complexity: 1 Mod  Tawni Melkonian, OTR/L [place]  Office [phone] Pager: [place] 10/30/2020, 1:00 PM

## 2020-10-30 NOTE — Progress Notes (Signed)
  Echocardiogram 2D Echocardiogram has been performed.  Burnard Hawthorne 10/30/2020, 9:16 AM

## 2020-10-30 NOTE — Evaluation (Signed)
SLP Cancellation Note  Patient Details Name: Bruce Smith MRN: [id number] DOB: 05-May-1951   Cancelled treatment:       Reason Eval/Treat Not Completed: Other (comment) (pt passed [facility] swallow screen, will continue efforts to complete SLE, per RN, pt appears functional)   Chales Abrahams 10/30/2020, 3:52 PM   Rolena Infante, MS [facility] SLP Acute Rehab Services Office [phone] Pager [phone]

## 2020-10-30 NOTE — Progress Notes (Signed)
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 Karl Pock note indicates overall status not really improved [facility] referral for transplant evaluation contemplated  He was admitted 1227 through 1228 with bilateral lower extremity pain, tenderness in his thigh and upper calf MRI not showing any acute findings cleared by PT OT and was discharged charged home after Dr. [name] his oncologist saw him  Readmitted 03/21 with metabolic encephalopathy, confusion, AKI BUN/creatinine up to 63/3.7 from baseline of 0.6 White count normal CT scan was negative Bladder scan showed 800 cc  Assessment & Plan:   Principal Problem:   AKI (acute kidney injury) ([place]) Active Problems:   Kappa light chain myeloma ([place])   Abnormal neurological exam   Essential hypertension   CAD (coronary artery disease)   1. Metabolic encephalopathy on admission a. Secondary to azotemia in addition to polypharmacy with opiates etc. see below discussions 2. AKI secondary to retention of urine a. Labs are improving slowly and hopeful for recovery-discontinued Maxide on admission b. Put out urine immediately after Foley placed 800 cc c. Repeat labs a.m. d. Start saline 75 cc/H 3. Light chain myeloma with multiple lytic lesions and chronic pain a. Pain meds can cause urinary retention b. Continue fentanyl patch only every 72 c. Holding currently MSIR 15 every 6 Lyrica 100 twice daily tramadol 50 d.  will use very low-dose Dilaudid p.o. for severe pain Tylenol first choice e. Can continue Megace for appetite stimulation, continue valacyclovir 500  daily f. MRI and CT scan negative for acute stroke so unlikely to have any bearing on confusion metabolic encephalopathy 4. ACDF in the past a. See above discussion regarding pain 5. CAD CABG year 2000 a. Continue aspirin 81 daily, Effient 10 daily 6. HTN 7. Orthostatic hypotension 12/31 likely secondary to volume depletion a. Holding Toprol-XL for now hold atorvastatin for now b. Recheck and reimplement meds very slowly 8. Depression a. Holding olanzapine 10 at bedtime,-can continue Ativan 0.5 every 6 as needed anxiety  DVT prophylaxis: Heparin Code Status: Full Family Communication:  Disposition:  Status is: Inpatient  Remains inpatient appropriate because:Hemodynamically unstable   Dispo: The patient is from: Home              Anticipated d/c is to: Home              Anticipated d/c date is: > 3 days              Patient currently is not medically stable to d/c.  Consultants:   Oncologist  Procedures: Scans  Antimicrobials: None currently   Subjective: He is awake coherent knows where he is and less confused than yesterday Foley catheter is in place He has no chest pain no fever and is not weak Wife present at the bedside  Objective: Vitals:   10/30/20 0352 10/30/20 1029 10/30/20 1033 10/30/20 1059  BP: (!) 108/51 (!) 131/115 (!) 88/60 (!) 122/45  Pulse: (!) 59 66 66 62  Resp: '18 17  15  ' Temp: (!) 97.4 F (36.3 C) 97.9 F (36.6 C)    TempSrc: Oral Oral    SpO2: 95% 97% 99%   Weight:  Height:        Intake/Output Summary (Last 24 hours) at 10/30/2020 1141 Last data filed at 10/30/2020 1059 Gross per 24 hour  Intake 1000 ml  Output 2100 ml  Net -1100 ml   Filed Weights   10/29/20 1029  Weight: 77.6 kg    Examination:  EOMI NCAT no focal deficit Thick neck Mallampati 4 occasionally falls asleep Chest clear no added sound no rales no rhonchi Abdomen soft no rebound no guarding No lower extremity edema ROM intact Foley in  place Neurologically intact moving all 4 limbs 5/5 power without deficit Reflexes deferred  Data Reviewed: I have personally reviewed following labs and imaging studies  BUNs/creatinine 16/0.6-->63/3.6-->63/3.3 Potassium 4.8 Bicarb 22 WBC 10.8-->9.1 Platelet 163 hemoglobin 10.4  Radiology Studies: CT Head Wo Contrast  Result Date: 10/29/2020 CLINICAL DATA:  Dizziness. Suspected stroke. Multiple myeloma. EXAM: CT HEAD WITHOUT CONTRAST TECHNIQUE: Contiguous axial images were obtained from the base of the skull through the vertex without intravenous contrast. COMPARISON:  10/26/2020. FINDINGS: Brain: Stable mild diffuse enlargement of the cortical sulci with normal size and position of the ventricles. No intracranial hemorrhage, mass lesion or CT evidence of acute infarction. Vascular: No hyperdense vessel or unexpected calcification. Skull: No significant change in multiple lytic lesions in the skull. Sinuses/Orbits: Unremarkable. Other: None. IMPRESSION: 1. No acute abnormality. 2. Stable mild diffuse cerebral atrophy. 3. No significant change in multiple lytic lesions in the skull, compatible with the patient's known multiple myeloma. Electronically Signed   By: Claudie Revering M.D.   On: 10/29/2020 11:48   MR BRAIN WO CONTRAST  Result Date: 10/29/2020 CLINICAL DATA:  Acute neuro deficit. Dizziness. History of multiple myeloma. EXAM: MRI HEAD WITHOUT CONTRAST TECHNIQUE: Multiplanar, multiecho pulse sequences of the brain and surrounding structures were obtained without intravenous contrast. COMPARISON:  CT head 10/29/2020 FINDINGS: Brain: Negative for acute infarct. No significant white matter changes. Negative for hemorrhage or mass.  Ventricle size normal. Vascular: Normal arterial flow voids. Skull and upper cervical spine: Multiple lesions in the calvarium compatible with myeloma as noted on CT. Largest lesion in the left parietal bone. Sinuses/Orbits: Paranasal sinuses clear.  Negative orbit  Other: None IMPRESSION: No acute intracranial abnormality Multiple lytic skull lesions compatible with myeloma Electronically Signed   By: Franchot Gallo M.D.   On: 10/29/2020 18:55     Scheduled Meds: .  stroke: mapping our early stages of recovery book   Does not apply Once  . aspirin EC  81 mg Oral Daily  . atorvastatin  20 mg Oral Daily  . Chlorhexidine Gluconate Cloth  6 each Topical Daily  . [START ON 10/31/2020] fentaNYL  1 patch Transdermal Q72H  . heparin  5,000 Units Subcutaneous Q8H  . megestrol  625 mg Oral Daily  . metoprolol succinate  50 mg Oral Daily  . pantoprazole  40 mg Oral Daily  . prasugrel  10 mg Oral Daily  . sodium chloride flush  3 mL Intravenous Q12H  . valACYclovir  1,000 mg Oral Daily   Continuous Infusions: . sodium chloride       LOS: 1 day    Time spent: [place], MD [facility] To contact the attending provider between 7A-7P or the covering provider during after hours 7P-7A, please log into the web site [email or web address] and access using universal [place] password for that web site. If you do not have the password, please call the hospital operator.  10/30/2020, 11:41 AM

## 2020-10-31 DIAGNOSIS — N179 Acute kidney failure, unspecified: Secondary | ICD-10-CM | POA: Diagnosis not present

## 2020-10-31 LAB — CBC
HCT: 31.6 % — ABNORMAL LOW (ref 39.0–52.0)
Hemoglobin: 10.2 g/dL — ABNORMAL LOW (ref 13.0–17.0)
MCH: 30.2 pg (ref 26.0–34.0)
MCHC: 32.3 g/dL (ref 30.0–36.0)
MCV: 93.5 fL (ref 80.0–100.0)
Platelets: 165 10*3/uL (ref 150–400)
RBC: 3.38 MIL/uL — ABNORMAL LOW (ref 4.22–5.81)
RDW: 17.2 % — ABNORMAL HIGH (ref 11.5–15.5)
WBC: 8.3 10*3/uL (ref 4.0–10.5)
nRBC: 0 % (ref 0.0–0.2)

## 2020-10-31 LAB — BASIC METABOLIC PANEL
Anion gap: 8 (ref 5–15)
BUN: 50 mg/dL — ABNORMAL HIGH (ref 8–23)
CO2: 21 mmol/L — ABNORMAL LOW (ref 22–32)
Calcium: 7.1 mg/dL — ABNORMAL LOW (ref 8.9–10.3)
Chloride: 111 mmol/L (ref 98–111)
Creatinine, Ser: 2.6 mg/dL — ABNORMAL HIGH (ref 0.61–1.24)
GFR, Estimated: 26 mL/min — ABNORMAL LOW (ref >60)
Glucose, Bld: 98 mg/dL (ref 70–99)
Potassium: 5.4 mmol/L — ABNORMAL HIGH (ref 3.5–5.1)
Sodium: 140 mmol/L (ref 135–145)

## 2020-10-31 LAB — OCCULT BLOOD X 1 CARD TO LAB, STOOL: Fecal Occult Bld: POSITIVE — AB

## 2020-10-31 MED ORDER — CALCIUM GLUCONATE-NACL 2-0.675 GM/100ML-% IV SOLN
2.0000 g | Freq: Once | INTRAVENOUS | Status: AC
  Administered 2020-10-31: 2000 mg via INTRAVENOUS
  Filled 2020-10-31: qty 100

## 2020-10-31 MED ORDER — TRAMADOL HCL 50 MG PO TABS
50.0000 mg | ORAL_TABLET | Freq: Three times a day (TID) | ORAL | Status: DC | PRN
  Administered 2020-10-31: 50 mg via ORAL
  Filled 2020-10-31 (×2): qty 1

## 2020-10-31 MED ORDER — FENTANYL 25 MCG/HR TD PT72
1.0000 | MEDICATED_PATCH | TRANSDERMAL | Status: DC
  Administered 2020-10-31: 1 via TRANSDERMAL
  Filled 2020-10-31: qty 1

## 2020-10-31 MED ORDER — SORBITOL 70 % SOLN
960.0000 mL | TOPICAL_OIL | Freq: Once | ORAL | Status: AC
  Administered 2020-10-31: 960 mL via RECTAL
  Filled 2020-10-31: qty 473

## 2020-10-31 MED ORDER — MINERAL OIL RE ENEM
1.0000 | ENEMA | Freq: Once | RECTAL | Status: AC
  Administered 2020-10-31: 1 via RECTAL
  Filled 2020-10-31: qty 1

## 2020-10-31 NOTE — Progress Notes (Signed)
Bruce Smith is about the same.  He still has the urinary Foley catheter in place.  He is making urine.  I still cannot understand why he has the urinary retention.  It might be because of pain medications.  The Duragesic patch has been discontinued.  He is on Dilaudid.  He says he gets dizzy when he stands up.  I suspect he probably has some orthostatic hypotension.  He did have a echocardiogram done yesterday.  This showed he has a good ejection fraction of 60-65%.  His appetite is still marginal.  He is not complaining of any nausea.  This is still all very puzzling.  Thankfully, his renal function is improving.  His creatinine is 2.6.  Maybe, the Foley catheter will be clamped today.  Maybe we can then see how well he does with his urination.  He does have some hypocalcemia.  I do not know if this might be an issue.  We will give him some IV calcium.  His white cell count is 8.3.  Hemoglobin 10.2.  His platelet count 165,000.  It still quite perplexing as to what is happening.  He still has a pain.  It is hard to figure out how to help with the pain given the urinary issues and his renal function.  I wonder if some IV acetaminophen might help.  I appreciate only great care that he is getting from all staff up on [address] E.  [name], MD  [facility] 3:19

## 2020-10-31 NOTE — Progress Notes (Signed)
Patient noted to be severely impacted with stool. Patient could not tolerate disimpaction per order and  approximately 800 ml of the SMOG enema was given ( all pt could tolerate) with little results.

## 2020-10-31 NOTE — Progress Notes (Signed)
Physical Therapy Treatment Patient Details Name: Bruce Smith MRN: [id number] DOB: 04/18/1951 Today's Date: 10/31/2020    History of Present Illness This is a 70 year old male with past medical history of multiple myeloma, CAD and MI, hypercalcemia of malignancy, hypertension, hyperlipidemia with recent hospitalization from 10/27-10/28 for weakness, falling and poor p.o. intake and was discharged home with a walker. Now back to ED with complaints of dizziness, slurred speech and impaired right hand grip. Patient admitted for possible TIA vs stroke vs polypharmacy and Acute Kidney injury.    PT Comments    Pt in good spirits and with marked improvement in activity tolerance and with only transient dizziness in stationary standing.  BP supine 126/58; sitting 125/66; standing 91/56 and after ambulating 85' 134/64.  Pt and spouse pleased with improvement.   Follow Up Recommendations  Home health PT;Supervision for mobility/OOB     Equipment Recommendations  None recommended by PT    Recommendations for Other Services       Precautions / Restrictions Precautions Precautions: Fall Precaution Comments: Hx of falls and orthostatic hypotension. Restrictions Weight Bearing Restrictions: No    Mobility  Bed Mobility Overal bed mobility: Needs Assistance Bed Mobility: Supine to Sit     Supine to sit: Min assist;HOB elevated     General bed mobility comments: min assist to bring trunk to upright  Transfers Overall transfer level: Needs assistance Equipment used: Rolling walker (2 wheeled) Transfers: Sit to/from Stand Sit to Stand: Min guard         General transfer comment: Steady assist only  Ambulation/Gait Ambulation/Gait assistance: Min assist;Min guard Gait Distance (Feet): 85 Feet Assistive device: Rolling walker (2 wheeled) Gait Pattern/deviations: Step-through pattern;Shuffle;Trunk flexed Gait velocity: decr   General Gait Details: cues for posture and position from  RW - distance ltd by fatigue but pt denies dizziness   Stairs             Wheelchair Mobility    Modified Rankin (Stroke Patients Only)       Balance Overall balance assessment: Needs assistance Sitting-balance support: Feet supported;No upper extremity supported Sitting balance-Leahy Scale: Fair     Standing balance support: Bilateral upper extremity supported Standing balance-Leahy Scale: Poor                              Cognition Arousal/Alertness: Awake/alert Behavior During Therapy: WFL for tasks assessed/performed Overall Cognitive Status: Within Functional Limits for tasks assessed                                        Exercises      General Comments        Pertinent Vitals/Pain Pain Assessment: Faces Faces Pain Scale: Hurts a little bit Pain Location: left leg Pain Intervention(s): Limited activity within patient's tolerance;Monitored during session    Home Living                      Prior Function            PT Goals (current goals can now be found in the care plan section) Acute Rehab PT Goals Patient Stated Goal: Be able to walk safely PT Goal Formulation: With patient Time For Goal Achievement: 11/13/20 Potential to Achieve Goals: Fair Progress towards PT goals: Progressing toward goals    Frequency    Min  3X/week      PT Plan Current plan remains appropriate    Co-evaluation PT/OT/SLP Co-Evaluation/Treatment: Yes Reason for Co-Treatment: For patient/therapist safety;To address functional/ADL transfers PT goals addressed during session: Mobility/safety with mobility        AM-PAC PT "6 Clicks" Mobility   Outcome Measure  Help needed turning from your back to your side while in a flat bed without using bedrails?: None Help needed moving from lying on your back to sitting on the side of a flat bed without using bedrails?: A Little Help needed moving to and from a bed to a chair  (including a wheelchair)?: A Little Help needed standing up from a chair using your arms (e.g., wheelchair or bedside chair)?: A Little Help needed to walk in hospital room?: A Little Help needed climbing 3-5 steps with a railing? : A Lot 6 Click Score: 18    End of Session Equipment Utilized During Treatment: Gait belt Activity Tolerance: Patient tolerated treatment well;Patient limited by fatigue Patient left: in chair;with call bell/phone within reach;with chair alarm set;with family/visitor present Nurse Communication: Mobility status PT Visit Diagnosis: Other abnormalities of gait and mobility (R26.89);Muscle weakness (generalized) (M62.81)     Time: 7579-7282 PT Time Calculation (min) (ACUTE ONLY): 16 min  Charges:  $Gait Training: 8-22 mins                     [place] Pager [phone] Office [phone]    Davaun Quintela 10/31/2020, 10:15 AM

## 2020-10-31 NOTE — Progress Notes (Signed)
Occupational Therapy Treatment Patient Details Name: Bruce Smith MRN: [id number] DOB: 02-12-1951 Today's Date: 10/31/2020    History of present illness This is a 70 year old male with past medical history of multiple myeloma, CAD and MI, hypercalcemia of malignancy, hypertension, hyperlipidemia with recent hospitalization from 10/27-10/28 for weakness, falling and poor p.o. intake and was discharged home with a walker. Now back to ED with complaints of dizziness, slurred speech and impaired right hand grip. Patient admitted for possible TIA vs stroke vs polypharmacy and Acute Kidney injury.   OT comments  Pt with improvements this session and able to tolerate increased amount of activity. Orthostatic BP monitored throughout (see below) with improved BP this date as well. Pt tolerating room and short distance mobility in hallway using RW and overall minA (+2 safety/chair follow). Pt fatigued with activity but is motivated to improve and return to his PLOF. Feel current POC remains appropriate at this time. Will continue to follow acutely.  BP supine 126/58 Sitting 125/66 Standing 91/56 (reports minimal to no dizziness)  After mobility approx 85' 134/64     Follow Up Recommendations  Home health OT;Supervision/Assistance - 24 hour (24hr initially)    Equipment Recommendations  None recommended by OT          Precautions / Restrictions Precautions Precautions: Fall Precaution Comments: Hx of falls and orthostatic hypotension. Restrictions Weight Bearing Restrictions: No       Mobility Bed Mobility Overal bed mobility: Needs Assistance Bed Mobility: Supine to Sit     Supine to sit: Min assist;HOB elevated     General bed mobility comments: min assist to bring trunk to upright  Transfers Overall transfer level: Needs assistance Equipment used: Rolling walker (2 wheeled) Transfers: Sit to/from Stand Sit to Stand: Min guard         General transfer comment: Steady assist  only    Balance Overall balance assessment: Needs assistance Sitting-balance support: Feet supported;No upper extremity supported Sitting balance-Leahy Scale: Fair     Standing balance support: Bilateral upper extremity supported Standing balance-Leahy Scale: Poor                             ADL either performed or assessed with clinical judgement   ADL Overall ADL's : Needs assistance/impaired     Grooming: Set up;Min guard;Sitting Grooming Details (indicate cue type and reason): supported sitting of recliner         Upper Body Dressing : Minimal assistance;Sitting Upper Body Dressing Details (indicate cue type and reason): second gown                 Functional mobility during ADLs: Minimal assistance;+2 for safety/equipment;Rolling walker General ADL Comments: pt with imprved tolerance to activity/improved BP today                       Cognition Arousal/Alertness: Awake/alert Behavior During Therapy: WFL for tasks assessed/performed Overall Cognitive Status: Within Functional Limits for tasks assessed                                          Exercises     Shoulder Instructions       General Comments      Pertinent Vitals/ Pain       Pain Assessment: Faces Faces Pain Scale: Hurts a little bit Pain Location:  left leg Pain Intervention(s): Limited activity within patient's tolerance;Monitored during session  Home Living                                          Prior Functioning/Environment              Frequency  Min 2X/week        Progress Toward Goals  OT Goals(current goals can now be found in the care plan section)  Progress towards OT goals: Progressing toward goals  Acute Rehab OT Goals Patient Stated Goal: Be able to walk safely OT Goal Formulation: With patient/family Time For Goal Achievement: 11/13/20 Potential to Achieve Goals: Good ADL Goals Pt Will Transfer to  Toilet: with supervision;ambulating;grab bars;regular height toilet Pt Will Perform Toileting - Clothing Manipulation and hygiene: with supervision;sit to/from stand Additional ADL Goal #1: Patient will perform 10 min functional activity or exercise activity as evidence of improving activity tolerance  Plan Discharge plan remains appropriate    Co-evaluation    PT/OT/SLP Co-Evaluation/Treatment: Yes Reason for Co-Treatment: For patient/therapist safety;To address functional/ADL transfers PT goals addressed during session: Mobility/safety with mobility OT goals addressed during session: ADL's and self-care;Strengthening/ROM      AM-PAC OT "6 Clicks" Daily Activity     Outcome Measure   Help from another person eating meals?: A Little Help from another person taking care of personal grooming?: A Little Help from another person toileting, which includes using toliet, bedpan, or urinal?: A Little Help from another person bathing (including washing, rinsing, drying)?: A Little Help from another person to put on and taking off regular upper body clothing?: A Little Help from another person to put on and taking off regular lower body clothing?: A Little 6 Click Score: 18    End of Session Equipment Utilized During Treatment: Gait belt;Rolling walker  OT Visit Diagnosis: Repeated falls (R29.6);Pain;Dizziness and giddiness (R42);Other symptoms and signs involving the nervous system (R29.898);History of falling (Z91.81);Unsteadiness on feet (R26.81) Pain - Right/Left: Left Pain - part of body: Leg   Activity Tolerance Patient tolerated treatment well   Patient Left in chair;with call bell/phone within reach;with chair alarm set;with family/visitor present   Nurse Communication Mobility status        Time: 7972-8206 OT Time Calculation (min): 15 min  Charges: OT General Charges $OT Visit: 1 Visit  Bruce Smith, OT Acute Rehabilitation Services Pager [phone] Office  [phone]    Bruce Smith 10/31/2020, 12:45 PM

## 2020-10-31 NOTE — Progress Notes (Signed)
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 Karl Pock note indicates overall status not really improved [facility] referral for transplant evaluation contemplated  He was admitted 1227 through 1228 with bilateral lower extremity pain, tenderness in his thigh and upper calf MRI not showing any acute findings cleared by PT OT and was discharged charged home after Dr. [name] his oncologist saw him  Readmitted 94/17 with metabolic encephalopathy, confusion, AKI BUN/creatinine up to 63/3.7 from baseline of 0.6 White count normal CT scan was negative Bladder scan showed 800 cc  Assessment & Plan:   Principal Problem:   AKI (acute kidney injury) ([place]) Active Problems:   Kappa light chain myeloma ([place])   Abnormal neurological exam   Essential hypertension   CAD (coronary artery disease)   1. Metabolic encephalopathy on admission a. 2/2 azotemia polypharmacy-stroke ruled out on admission MRI and CT head b. Resolved patient ambulatory in halls 2. AKI secondary to retention of urine a. Pain meds can cause urinary retention b. Labs improved c. Plan catheter today see if sensation to void otherwise needs outpatient urology follow-up family aware d. Continuing saline 75 cc/H 3. Bright red blood per rectum 4. Stool ball in rectum a. Hemoccult card to me is positive-await confirmation b. Hemoglobin however stable 10 range c. If no large amounts of bleeding may be internal hemorrhoids which may need Proctofoam etc. otherwise will need GI input d. Does have severe constipation requiring meds--will order Milk and molasses,  might need disimpaction 5. Light chain myeloma with multiple lytic lesions 6. chronic pain a. Holding MSIR 15 every 6 Lyrica 100 twice b. fentanyl patch only every 72 c. resume tramadol 50 3 times daily d.  will use very low-dose Dilaudid p.o. for severe pain Tylenol first choice e. Can continue Megace for appetite stimulation, continue valacyclovir 500 daily 7. ACDF in the past a. See above discussion 8. CAD CABG year 2000 a. Continue aspirin 81 daily, Effient 10 daily 9. HTN 10. Continued orthostatic hypotension since 12/31 likely secondary to volume depletion a. Holding Toprol-XL for now hold atorvastatin for now b. Recheck and reimplement meds very slowly 11. Depression a. Reimplement olanzapine 10 at bedtime 10/31/2020-continue Ativan 0.5 every 6 as needed anxiety  DVT prophylaxis: Heparin Code Status: Full Family Communication: Wife at the bedside Disposition:  Status is: Inpatient  Remains inpatient appropriate because:Hemodynamically unstable   Dispo: The patient is from: Home              Anticipated d/c is to: Home              Anticipated d/c date is: > 3 days              Patient currently is not medically stable to d/c.  Consultants:   Oncologist  Procedures: Scans  Antimicrobials: None currently   Subjective:  Some reports of bright red blood per rectum with stool earlier today confirmed by nursing staff He is asymptomatic he walked in the hallways but became orthostatic as per notes once again so blood pressure meds on hold He does not have a sensation to void despite catheter being clamped He has no chest pain  He has no fever chills  Objective: Vitals:   10/30/20 1625 10/30/20 1629 10/30/20 2037 10/31/20 0657  BP: (!) 115/58 100/65 (!) 116/54 122/60  Pulse: 67 76 67 66  Resp: _0 Temp: 98.8 F (37.1 C)  98.5 F (36.9 C) 98.4 F (36.9 C)  TempSrc: Oral  Oral Oral  SpO2: 97% 100% 96% 95%  Weight:      Height:        Intake/Output  Summary (Last 24 hours) at 10/31/2020 1002 Last data filed at 10/31/2020 0702 Gross per 24 hour  Intake 200 ml  Output 3000 ml  Net -2800 ml   Filed Weights   10/29/20 1029  Weight: 77.6 kg    Examination:  EOMI NCAT no focal deficit Thick neck Mallampati 4 more coherent today Chest clear no added sound no rales no rhonchi Abdomen soft no rebound no guarding  Rectal exam did not show any red stool it was brownish in color to my exam in the vault there was a ball of stool No lower extremity edema ROM intact Foley in place Neurologically intact moving all 4 limbs 5/5 power without deficit Reflexes deferred  Data Reviewed: I have personally reviewed following labs and imaging studies  BUNs/creatinine 16/0.6-->63/3.6-->63/3.3-->50/2.6 Potassium 4.8-->5.4 Bicarb 21 WBC 10.8-->9.1-->8.3 Hemoglobin stable in the 10.4 range Platelet 163 hemoglobin 10.4  Radiology Studies: CT Head Wo Contrast  Result Date: 10/29/2020 CLINICAL DATA:  Dizziness. Suspected stroke. Multiple myeloma. EXAM: CT HEAD WITHOUT CONTRAST TECHNIQUE: Contiguous axial images were obtained from the base of the skull through the vertex without intravenous contrast. COMPARISON:  10/26/2020. FINDINGS: Brain: Stable mild diffuse enlargement of the cortical sulci with normal size and position of the ventricles. No intracranial hemorrhage, mass lesion or CT evidence of acute infarction. Vascular: No hyperdense vessel or unexpected calcification. Skull: No significant change in multiple lytic lesions in the skull. Sinuses/Orbits: Unremarkable. Other: None. IMPRESSION: 1. No acute abnormality. 2. Stable mild diffuse cerebral atrophy. 3. No significant change in multiple lytic lesions in the skull, compatible with the patient's known multiple myeloma. Electronically Signed   By: Claudie Revering M.D.   On: 10/29/2020 11:48   MR BRAIN WO CONTRAST  Result Date: 10/29/2020 CLINICAL DATA:  Acute neuro deficit. Dizziness. History of  multiple myeloma. EXAM: MRI HEAD WITHOUT CONTRAST TECHNIQUE: Multiplanar, multiecho pulse sequences of the brain and surrounding structures were obtained without intravenous contrast. COMPARISON:  CT head 10/29/2020 FINDINGS: Brain: Negative for acute infarct. No significant white matter changes. Negative for hemorrhage or mass.  Ventricle size normal. Vascular: Normal arterial flow voids. Skull and upper cervical spine: Multiple lesions in the calvarium compatible with myeloma as noted on CT. Largest lesion in the left parietal bone. Sinuses/Orbits: Paranasal sinuses clear.  Negative orbit Other: None IMPRESSION: No acute intracranial abnormality Multiple lytic skull lesions compatible with myeloma Electronically Signed   By: Franchot Gallo M.D.   On: 10/29/2020 18:55   ECHOCARDIOGRAM COMPLETE  Result Date: 10/30/2020    ECHOCARDIOGRAM REPORT   Patient Name:   Bruce Smith Date of Exam: 10/30/2020 Medical Rec #:  [id number]    Height:       68.0 in Accession #:    [id number]   Weight:       171.0 lb Date of Birth:  1951/03/01     BSA:          1.912 m Patient Age:    38 years     BP:  105/51 mmHg Patient Gender: M            HR:           63 bpm. Exam Location:  Inpatient Procedure: 2D Echo, Cardiac Doppler and Color Doppler Indications:    Stroke 434.91 / I163.9  History:        Patient has no prior history of Echocardiogram examinations.                 Previous Myocardial Infarction and CAD; Risk                 Factors:Hypertension, Dyslipidemia and Former Smoker.  Sonographer:    Vickie Epley RDCS Referring Phys: [id number] [place]  1. Left ventricular ejection fraction, by estimation, is 60 to 65%. The left ventricle has normal function. The left ventricle has no regional wall motion abnormalities. Left ventricular diastolic parameters were normal. Elevated left atrial pressure.  2. Right ventricular systolic function is normal. The right ventricular size is normal. There is normal  pulmonary artery systolic pressure.  3. The mitral valve is normal in structure. No evidence of mitral valve regurgitation. No evidence of mitral stenosis.  4. The aortic valve is normal in structure. There is mild calcification of the aortic valve. There is mild thickening of the aortic valve. Aortic valve regurgitation is not visualized. No aortic stenosis is present.  5. The inferior vena cava is normal in size with greater than 50% respiratory variability, suggesting right atrial pressure of 3 mmHg. FINDINGS  Left Ventricle: Left ventricular ejection fraction, by estimation, is 60 to 65%. The left ventricle has normal function. The left ventricle has no regional wall motion abnormalities. The left ventricular internal cavity size was normal in size. There is  no left ventricular hypertrophy. Left ventricular diastolic parameters were normal. Elevated left atrial pressure. Right Ventricle: The right ventricular size is normal. No increase in right ventricular wall thickness. Right ventricular systolic function is normal. There is normal pulmonary artery systolic pressure. The tricuspid regurgitant velocity is 1.92 m/s, and  with an assumed right atrial pressure of 3 mmHg, the estimated right ventricular systolic pressure is 59.5 mmHg. Left Atrium: Left atrial size was normal in size. Right Atrium: Right atrial size was normal in size. Pericardium: There is no evidence of pericardial effusion. Mitral Valve: The mitral valve is normal in structure. No evidence of mitral valve regurgitation. No evidence of mitral valve stenosis. Tricuspid Valve: The tricuspid valve is normal in structure. Tricuspid valve regurgitation is not demonstrated. No evidence of tricuspid stenosis. Aortic Valve: The aortic valve is normal in structure. There is mild calcification of the aortic valve. There is mild thickening of the aortic valve. Aortic valve regurgitation is not visualized. No aortic stenosis is present. Pulmonic Valve: The  pulmonic valve was normal in structure. Pulmonic valve regurgitation is not visualized. No evidence of pulmonic stenosis. Aorta: The aortic root is normal in size and structure. Venous: The inferior vena cava is normal in size with greater than 50% respiratory variability, suggesting right atrial pressure of 3 mmHg. IAS/Shunts: No atrial level shunt detected by color flow Doppler.  LEFT VENTRICLE PLAX 2D LVIDd:         4.80 cm      Diastology LVIDs:         3.40 cm      LV e' medial:    6.41 cm/s LV PW:         0.80 cm  LV E/e' medial:  11.6 LV IVS:        0.80 cm      LV e' lateral:   7.70 cm/s LVOT diam:     2.20 cm      LV E/e' lateral: 9.7 LV SV:         86 LV SV Index:   45 LVOT Area:     3.80 cm  LV Volumes (MOD) LV vol d, MOD A2C: 105.0 ml LV vol d, MOD A4C: 121.0 ml LV vol s, MOD A2C: 44.7 ml LV vol s, MOD A4C: 57.1 ml LV SV MOD A2C:     60.3 ml LV SV MOD A4C:     121.0 ml LV SV MOD BP:      64.5 ml RIGHT VENTRICLE RV S prime:     10.50 cm/s TAPSE (M-mode): 2.4 cm LEFT ATRIUM           Index       RIGHT ATRIUM           Index LA diam:      3.00 cm 1.57 cm/m  RA Area:     12.80 cm LA Vol (A2C): 30.7 ml 16.06 ml/m RA Volume:   26.60 ml  13.91 ml/m LA Vol (A4C): 25.9 ml 13.55 ml/m  AORTIC VALVE LVOT Vmax:   93.80 cm/s LVOT Vmean:  63.500 cm/s LVOT VTI:    0.227 m  AORTA Ao Root diam: 3.50 cm MITRAL VALVE               TRICUSPID VALVE MV Area (PHT): 3.72 cm    TR Peak grad:   14.7 mmHg MV Decel Time: 204 msec    TR Vmax:        192.00 cm/s MV E velocity: 74.60 cm/s MV A velocity: 71.10 cm/s  SHUNTS MV E/A ratio:  1.05        Systemic VTI:  0.23 m                            Systemic Diam: 2.20 cm Dani Gobble Croitoru MD Electronically signed by Sanda Klein MD Signature Date/Time: 10/30/2020/12:52:36 PM    Final      Scheduled Meds: .  stroke: mapping our early stages of recovery book   Does not apply Once  . aspirin EC  81 mg Oral Daily  . Chlorhexidine Gluconate Cloth  6 each Topical Daily  .  fentaNYL  1 patch Transdermal Q72H  . heparin  5,000 Units Subcutaneous Q8H  . megestrol  625 mg Oral Daily  . pantoprazole  40 mg Oral Daily  . prasugrel  10 mg Oral Daily  . sodium chloride flush  3 mL Intravenous Q12H  . sorbitol, milk of mag, mineral oil, glycerin (SMOG) enema  960 mL Rectal Once  . valACYclovir  1,000 mg Oral Daily   Continuous Infusions: . sodium chloride    . calcium gluconate       LOS: 2 days    Time spent: [place], MD [facility] To contact the attending provider between 7A-7P or the covering provider during after hours 7P-7A, please log into the web site [email or web address] and access using universal  password for that web site. If you do not have the password, please call the hospital operator.  10/31/2020, 10:02 AM

## 2020-11-01 DIAGNOSIS — N179 Acute kidney failure, unspecified: Secondary | ICD-10-CM | POA: Diagnosis not present

## 2020-11-01 LAB — CBC
HCT: 32 % — ABNORMAL LOW (ref 39.0–52.0)
Hemoglobin: 10.4 g/dL — ABNORMAL LOW (ref 13.0–17.0)
MCH: 29.8 pg (ref 26.0–34.0)
MCHC: 32.5 g/dL (ref 30.0–36.0)
MCV: 91.7 fL (ref 80.0–100.0)
Platelets: 186 10*3/uL (ref 150–400)
RBC: 3.49 MIL/uL — ABNORMAL LOW (ref 4.22–5.81)
RDW: 17.1 % — ABNORMAL HIGH (ref 11.5–15.5)
WBC: 6.5 10*3/uL (ref 4.0–10.5)
nRBC: 0 % (ref 0.0–0.2)

## 2020-11-01 LAB — COMPREHENSIVE METABOLIC PANEL
ALT: 34 U/L (ref 0–44)
AST: 15 U/L (ref 15–41)
Albumin: 2.7 g/dL — ABNORMAL LOW (ref 3.5–5.0)
Alkaline Phosphatase: 98 U/L (ref 38–126)
Anion gap: 9 (ref 5–15)
BUN: 36 mg/dL — ABNORMAL HIGH (ref 8–23)
CO2: 19 mmol/L — ABNORMAL LOW (ref 22–32)
Calcium: 7 mg/dL — ABNORMAL LOW (ref 8.9–10.3)
Chloride: 111 mmol/L (ref 98–111)
Creatinine, Ser: 1.75 mg/dL — ABNORMAL HIGH (ref 0.61–1.24)
GFR, Estimated: 42 mL/min — ABNORMAL LOW (ref >60)
Glucose, Bld: 97 mg/dL (ref 70–99)
Potassium: 4.3 mmol/L (ref 3.5–5.1)
Sodium: 139 mmol/L (ref 135–145)
Total Bilirubin: 0.7 mg/dL (ref 0.3–1.2)
Total Protein: 5.2 g/dL — ABNORMAL LOW (ref 6.5–8.1)

## 2020-11-01 MED ORDER — POLYETHYLENE GLYCOL 3350 17 G PO PACK: 17.0000 g | PACK | Freq: Every day | ORAL | 1 refills | Status: DC

## 2020-11-01 MED ORDER — PREGABALIN 100 MG PO CAPS: 100.0000 mg | ORAL_CAPSULE | Freq: Every day | ORAL | 0 refills | Status: DC

## 2020-11-01 MED ORDER — HYDROMORPHONE HCL 2 MG PO TABS: 1.0000 mg | ORAL_TABLET | Freq: Two times a day (BID) | ORAL | 0 refills | Status: DC | PRN

## 2020-11-01 NOTE — Discharge Summary (Signed)
Physician Discharge Summary  Bruce Smith IHK:[id number] DOB: 04-Jul-1951 DOA: 10/29/2020  PCP: Derrill Center., MD  Admit date: 10/29/2020 Discharge date: 11/01/2020  Time spent: 40 minutes  Recommendations for Outpatient Follow-up:  1. Get CBC Chem-12 1 week 2. New medications this admission Dilaudid 1 mg as needed prescription given, MiraLAX 3. 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 Karl Pock note indicates overall status not really improved [facility] referral for transplant evaluation contemplated   He was admitted 1227 through 1228 with bilateral lower extremity pain, tenderness in his thigh and upper calf MRI not showing any acute findings cleared by PT OT and was discharged charged home after Dr. [name] his oncologist saw him   Readmitted 70/64 with metabolic encephalopathy, 70 confusion, AKI BUN/creatinine up to 63/3.7 from baseline of 70 White count normal CT scan was negative Bladder scan showed 800 cc   Assessment & Plan:   Principal Problem:   AKI (acute kidney injury) ([place]) Active Problems:   Kappa light chain myeloma ([place])   Abnormal neurological exam   Essential hypertension   CAD (coronary artery disease)     1. Metabolic encephalopathy on admission a. 2/2  azotemia polypharmacy-stroke ruled out on admission MRI and CT head b. Resolved patient ambulatory in halls 2. AKI secondary to retention of urine a. Pain meds and/or fecal impaction did cause urinary retention b. Labs improved c. Catheter clamped and discontinued as he had good sensation and is passing good urine 3. Fecal impaction with blood loss from this a. Hemoccult card to me is positive-await confirmationHemoglobin stable 10 range b. Does have severe constipation requiring meds-disimpacted with mineral oil enema and passing some stool now 4. Light chain myeloma with multiple lytic lesions 5. chronic pain a. Holding MSIR 15 every 6  b. Lyrica 100 twice daily changed to once daily on discharge c. fentanyl patch only every 72 d. resume tramadol 50 3 times daily e.  will use very low-dose Dilaudid p.o. for severe pain Tylenol first choice f. Can continue Megace for appetite stimulation, continue valacyclovir 500 daily 6. ACDF in the past a. See above discussion 7. CAD CABG year 2000 a. Continue aspirin 81 daily, Effient 10 daily 8. HTN 9. Continued orthostatic hypotension since 12/31 likely secondary to volume depletion a. Resume Toprol on discharge b. Half the dose of Hyzaar on discharge c. Recheck and reimplement meds very slowly 10. Depression a. Not taking olanzapine 10 at bedtime  b.  Ativan 0.5  as needed anxiety   Discharge Exam: Vitals:   11/01/20 1223 11/01/20 1229  BP: 121/80 117/64  Pulse: 79 95  Resp: 18   Temp:    SpO2: 96% 98%    General: Awake coherent no distress EOMI NCAT no focal deficit coherent Cardiovascular: S1-S2 no murmur no rub no gallop Respiratory: Clear no added sound no rales  no rhonchi abdomen soft nontender Much more appropriate coherent No distress Moving all 4 limbs  Discharge Instructions   Discharge Instructions    Diet - low sodium heart healthy   Complete by: As directed    Discharge instructions   Complete by: As  directed    You were diagnosed with probable severe constipation and a stool ball in your rectum This caused some retention of urin which thankfully got better There have been several changes to your medications as below Primarily you should be taking something calle MiraLAX which should be taken daily and expect to have a stool every other day if not take the senna in addition on that second dayDPlease look at your meds carefully as we have made some changes including holding off some of your blood pressure medications and discontinuing various other pain medications and use what is given on the list as we have prescribed and please follow-up with your oncologist this coming Tuesday, 4 January so that they can talk to you about further options  please get labs at their office and follow-up   Increase activity slowly   Complete by: As directed      Allergies as of 11/01/2020   No Known Allergies     Medication List    STOP taking these medications   dexamethasone 4 MG tablet Commonly known as: DECADRON   metoprolol succinate 50 MG 24 hr tablet Commonly known as: TOPROL-XL   morphine 15 MG tablet Commonly known as: MSIR   naloxone 4 MG/0.1ML Liqd nasal spray kit Commonly known as: NARCAN   OLANZapine 10 MG tablet Commonly known as: ZYPREXA   ondansetron 8 MG tablet Commonly known as: Zofran   prochlorperazine 10 MG tablet Commonly known as: COMPAZINE   triamterene-hydrochlorothiazide 75-50 MG tablet Commonly known as: Maxzide     TAKE these medications   acetaminophen 500 MG tablet Commonly known as: TYLENOL Take 1,000 mg by mouth every 6 (six) hours.   aspirin 81 MG EC tablet Take 81 mg by mouth daily. Swallow whole.   atorvastatin 40 MG tablet Commonly known as: LIPITOR Take 20 mg by mouth daily.   Co Q-10 200 MG Caps Take 200 mg by mouth daily.   famciclovir 500 MG tablet Commonly known as: FAMVIR Take 1 tablet (500 mg total) by mouth daily.   fentaNYL 25  MCG/HR Commonly known as: Chase 1 patch onto the skin every 3 (three) days.   HYDROmorphone 2 MG tablet Commonly known as: DILAUDID Take 0.5 tablets (1 mg total) by mouth every 12 (twelve) hours as needed for up to 12 days for severe pain.   LORazepam 0.5 MG tablet Commonly known as: ATIVAN Take 1 tablet (0.5 mg total) by mouth every 6 (six) hours as needed for anxiety (nausea).   megestrol 625 MG/5ML suspension Commonly known as: MEGACE ES Take 5 mLs (625 mg total) by mouth daily.   MIRALAX PO Take 2 Scoops by mouth daily as needed (constipation). What changed: Another medication with the same name was added. Make sure you understand how and when to take each.   polyethylene glycol 17 g packet Commonly known as: MiraLax Take 17 g by mouth daily. What changed: You were already taking a medication with the same name, and this prescription was added. Make sure you understand how and when to take each.   nitroGLYCERIN 0.4 MG SL tablet Commonly known as: NITROSTAT Place 0.4 mg under the tongue every 5 (five) minutes as needed for  chest pain.   omeprazole 40 MG capsule Commonly known as: PRILOSEC Take 40 mg by mouth daily.   prasugrel 10 MG Tabs tablet Commonly known as: EFFIENT Take 10 mg by mouth daily.   pregabalin 100 MG capsule Commonly known as: LYRICA Take 1 capsule (100 mg total) by mouth daily. What changed: when to take this   sennosides-docusate sodium 8.6-50 MG tablet Commonly known as: SENOKOT-S Take 2 tablets by mouth daily. What changed:   when to take this  reasons to take this   silver sulfADIAZINE 1 % cream Commonly known as: Silvadene Apply 1 application topically 2 (two) times daily. What changed:   when to take this  reasons to take this   traMADol 50 MG tablet Commonly known as: ULTRAM Take 1 tablet (50 mg total) by mouth 3 (three) times daily as needed for moderate pain.      No Known Allergies    The results of  significant diagnostics from this hospitalization (including imaging, microbiology, ancillary and laboratory) are listed below for reference.    Significant Diagnostic Studies: DG Elbow 2 Views Right  Result Date: 10/26/2020 CLINICAL DATA:  Pain following fall EXAM: RIGHT ELBOW - 2 VIEW COMPARISON:  None. FINDINGS: Frontal and lateral views were obtained. No fracture or dislocation. No joint effusion. No appreciable joint space narrowing. Prominent olecranon process spur noted. Small spur along the medial distal humeral condyle. IMPRESSION: No fracture or dislocation. No appreciable joint space narrowing. Prominent olecranon process spur. Small spur along the medial distal humeral condyle likely represents focal calcific tendinosis. Electronically Signed   By: Lowella Grip III M.D.   On: 10/26/2020 12:10   CT Head Wo Contrast  Result Date: 10/29/2020 CLINICAL DATA:  Dizziness. Suspected stroke. Multiple myeloma. EXAM: CT HEAD WITHOUT CONTRAST TECHNIQUE: Contiguous axial images were obtained from the base of the skull through the vertex without intravenous contrast. COMPARISON:  10/26/2020. FINDINGS: Brain: Stable mild diffuse enlargement of the cortical sulci with normal size and position of the ventricles. No intracranial hemorrhage, mass lesion or CT evidence of acute infarction. Vascular: No hyperdense vessel or unexpected calcification. Skull: No significant change in multiple lytic lesions in the skull. Sinuses/Orbits: Unremarkable. Other: None. IMPRESSION: 1. No acute abnormality. 2. Stable mild diffuse cerebral atrophy. 3. No significant change in multiple lytic lesions in the skull, compatible with the patient's known multiple myeloma. Electronically Signed   By: Claudie Revering M.D.   On: 10/29/2020 11:48   CT Head Wo Contrast  Result Date: 10/26/2020 CLINICAL DATA:  TIA.  Weakness and falling. EXAM: CT HEAD WITHOUT CONTRAST TECHNIQUE: Contiguous axial images were obtained from the base of  the skull through the vertex without intravenous contrast. COMPARISON:  06/26/2020 FINDINGS: Brain: The brain does not show accelerated atrophy. No evidence of old or acute focal infarction, mass lesion, hemorrhage, hydrocephalus or extra-axial collection. Vascular: There is atherosclerotic calcification of the major vessels at the base of the brain. Skull: Myeloma lytic lesions within the calvarium, unchanged since August. The largest is in the left parietal calvarium measuring about 1.5 cm in diameter. No sign of progressive lytic disease. Sinuses/Orbits: Clear/normal Other: None IMPRESSION: 1. No acute or reversible finding. Normal appearance of the brain itself. 2. Myeloma lytic lesions within the calvarium, unchanged since August. The largest is in the left parietal calvarium measuring about 1.5 cm in diameter. No sign of progressive lytic disease. Electronically Signed   By: Nelson Chimes M.D.   On: 10/26/2020 11:48   MR  BRAIN WO CONTRAST  Result Date: 10/29/2020 CLINICAL DATA:  Acute neuro deficit. Dizziness. History of multiple myeloma. EXAM: MRI HEAD WITHOUT CONTRAST TECHNIQUE: Multiplanar, multiecho pulse sequences of the brain and surrounding structures were obtained without intravenous contrast. COMPARISON:  CT head 10/29/2020 FINDINGS: Brain: Negative for acute infarct. No significant white matter changes. Negative for hemorrhage or mass.  Ventricle size normal. Vascular: Normal arterial flow voids. Skull and upper cervical spine: Multiple lesions in the calvarium compatible with myeloma as noted on CT. Largest lesion in the left parietal bone. Sinuses/Orbits: Paranasal sinuses clear.  Negative orbit Other: None IMPRESSION: No acute intracranial abnormality Multiple lytic skull lesions compatible with myeloma Electronically Signed   By: Franchot Gallo M.D.   On: 10/29/2020 18:55   MR Lumbar Spine W Wo Contrast  Result Date: 10/26/2020 CLINICAL DATA:  Multiple myeloma, increased weakness and  falls EXAM: MRI LUMBAR SPINE WITHOUT AND WITH CONTRAST TECHNIQUE: Multiplanar and multiecho pulse sequences of the lumbar spine were obtained without and with intravenous contrast. CONTRAST:  45mL GADAVIST GADOBUTROL 1 MMOL/ML IV SOLN COMPARISON:  None. FINDINGS: Motion artifact is present. Segmentation:  Standard. Alignment:  No significant anteroposterior listhesis. Vertebrae: Multifocal STIR hyperintensity and enhancement throughout the visualized osseous structures including the lumbar spine. There is compression deformity of L2 with loss of height at the inferior endplate measuring less than 50%. Milder loss of height is present at the inferior endplate of L1. No significant epidural disease. Conus medullaris and cauda equina: Conus extends to the L1-L2 level. Conus and cauda equina appear normal. There is a fatty filum terminale. No definite intrathecal enhancement with limitation of motion artifact. Paraspinal and other soft tissues: Unremarkable. Disc levels: L1-L2:  No canal or foraminal stenosis. L2-L3: Disc bulge with superimposed left foraminal protrusion. Facet arthropathy with ligamentum flavum infolding. Mild to moderate canal stenosis with partial effacement of the subarticular recesses. Minor right foraminal stenosis. Moderate left foraminal stenosis. L3-L4: Disc bulge. Facet arthropathy with ligamentum flavum infolding. Mild canal stenosis. Mild to moderate right foraminal stenosis. Minor left foraminal stenosis. L4-L5: Disc bulge with endplate osteophytic ridging eccentric to the right. Facet arthropathy with ligamentum flavum infolding. No canal stenosis. Partial effacement of the right subarticular recess. Mild right foraminal stenosis. Minor left foraminal stenosis. L5-S1:  Facet arthropathy.  No canal or foraminal stenosis. IMPRESSION: Lesions throughout the included osseous structures reflecting multiple myeloma. No significant epidural disease. No acute compression deformity. Multilevel  degenerative changes as detailed above. Electronically Signed   By: Macy Mis M.D.   On: 10/26/2020 17:56   MR PELVIS W WO CONTRAST  Result Date: 10/26/2020 CLINICAL DATA:  History of multiple myeloma, patient had a recent fall EXAM: MRI PELVIS WITHOUT AND WITH CONTRAST TECHNIQUE: Multiplanar multisequence MR imaging of the pelvis was performed both before and after administration of intravenous contrast. CONTRAST:  33mL GADAVIST GADOBUTROL 1 MMOL/ML IV SOLN COMPARISON:  MRI lumbar spine October 26, 2020 and head CT on October 16, 2020 FINDINGS: Urinary Tract: The visualized distal ureters and bladder appear unremarkable. Bowel: No bowel wall thickening, distention or surrounding inflammation identified within the pelvis. Vascular/Lymphatic: No enlarged pelvic lymph nodes identified. No significant vascular findings. Reproductive: The prostate gland is grossly unremarkable. Other: A small amount of presacral edema and trace free fluid seen within the deep pelvis. Musculoskeletal: Multiple heterogeneously enhancing T2 bright/T1 dark osseous blastic lesions are seen throughout the visualized portion of the lumbar spine and pelvis as on the recent PET CT. The patient is status post  left ORIF with IM nail fixation. There is a large lytic lesion within the left proximal femur and femoral neck with healing fracture. No acute fracture is seen. The sacroiliac joints are intact. No large hip joint effusions are seen. IMPRESSION: Multiple lytic lesions throughout the lumbar spine and pelvis consistent with the patient's no are myomatous lesions. No significant change from the prior exam. No acute fracture or malalignment. Small amount of presacral edema and free fluid in the deep pelvis. Electronically Signed   By: Prudencio Pair M.D.   On: 10/26/2020 18:58   NM PET Image Initial (PI) Whole Body  Result Date: 10/16/2020 CLINICAL DATA:  Subsequent treatment strategy for multiple myeloma. EXAM: NUCLEAR MEDICINE  PET WHOLE BODY TECHNIQUE: 8.8 mCi F-18 FDG was injected intravenously. Full-ring PET imaging was performed from the head to foot after the radiotracer. CT data was obtained and used for attenuation correction and anatomic localization. Fasting blood glucose: 109 mg/dl COMPARISON:  PET-CT 07/07/2020 FINDINGS: Mediastinal blood pool activity: SUV max 2.27 HEAD/NECK: No hypermetabolic activity in the scalp. No hypermetabolic cervical lymph nodes. Incidental CT findings: none CHEST: Large area of significant radiation change involving the left lateral lung this is related to the left rib and left scapular radiation therapy. It is markedly hypermetabolic with SUV max of 9.24. The left rib lesion has near completely resolved. The left scapular lesion demonstrates significant interval healing changes with bony ingrowth and sclerotic changes. No enlarged or hypermetabolic mediastinal or hilar lymph nodes and no axillary or supraclavicular adenopathy. Improved osseous myelomatous changes involving the right shoulder including the humeral head and right scapula. Minimal residual hypermetabolism with SUV max of 3.0 in the humeral neck area. This was previously 9.1. The SUV max in the left scapular lesion is 3.69 and was previously 15.13. Several rib lesions were hypermetabolic on the prior study and these also show significant improvement. Incidental CT findings: none ABDOMEN/PELVIS: No abnormal hypermetabolic activity within the liver, pancreas, adrenal glands, or spleen. No hypermetabolic lymph nodes in the abdomen or pelvis. Hypermetabolism noted in the anorectal region which could be due to hemorrhoids. SUV max is 5.34. No CT correlate is demonstrated. Incidental CT findings: Stable advanced vascular calcifications. SKELETON: Since the prior study the patient has had hardware placed in the left hip. The destructive myelomatous changes seen on the prior PET have improved. The bilateral sacral and iliac disease had an SUV  max of 6.19 and this is now 3.30. Some sclerotic healing changes are also noted on the CT scan. The lesion in the left femoral neck had an SUV max of 4.91. This is difficult to assist now due to the hardware but appears improved. Patient had significant lumbar myelomatous disease on the prior study. SUV max was 9.71. SUV max is now 3.67 and there is significant healing changes. I do not see any new osseous lesions. Incidental CT findings: none EXTREMITIES: No myelomatous lesions are identified in the lower extremities. The upper extremity show improvement as detailed above. Incidental CT findings: none IMPRESSION: 1. Significant interval improved PET scan when compared to the prior study. Findings suggest an excellent response to treatment. No new or progressive findings. 2. 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 Electronically Signed   By: Marijo Sanes M.D.   On: 10/16/2020 14:38   US Venous Img Lower Unilateral Right (DVT)  Result Date: 10/13/2020 CLINICAL DATA:  Right lower extremity pain and edema EXAM: RIGHT LOWER EXTREMITY VENOUS DUPLEX ULTRASOUND TECHNIQUE: Gray-scale sonography with  graded compression, as well as color Doppler and duplex ultrasound were performed to evaluate the right lower extremity deep venous system from the level of the common femoral vein and including the common femoral, femoral, profunda femoral, popliteal and calf veins including the posterior tibial, peroneal and gastrocnemius veins when visible. The superficial great saphenous vein was also interrogated. Spectral Doppler was utilized to evaluate flow at rest and with distal augmentation maneuvers in the common femoral, femoral and popliteal veins. COMPARISON:  None. FINDINGS: Contralateral Common Femoral Vein: Respiratory phasicity is normal and symmetric with the symptomatic side. No evidence of thrombus. Normal compressibility. Common Femoral Vein: No evidence of thrombus. Normal  compressibility, respiratory phasicity and response to augmentation. Saphenofemoral Junction: No evidence of thrombus. Normal compressibility and flow on color Doppler imaging. Profunda Femoral Vein: No evidence of thrombus. Normal compressibility and flow on color Doppler imaging. Femoral Vein: No evidence of thrombus. Normal compressibility, respiratory phasicity and response to augmentation. Popliteal Vein: No evidence of thrombus. Normal compressibility, respiratory phasicity and response to augmentation. Calf Veins: No evidence of thrombus. Normal compressibility and flow on color Doppler imaging. Superficial Great Saphenous Vein: No evidence of thrombus. Normal compressibility. Venous Reflux:  None. Other Findings:  None. IMPRESSION: No evidence of deep venous thrombosis in the right lower extremity. Left common femoral vein also patent. Electronically Signed   By: Lowella Grip III M.D.   On: 10/13/2020 15:18   CT Biopsy  Result Date: 10/19/2020 INDICATION: History of multiple myeloma, currently undergoing treatment. Please perform CT-guided bone marrow biopsy and aspiration to evaluate for response to therapy. EXAM: CT-GUIDED BONE MARROW BIOPSY AND ASPIRATION MEDICATIONS: None ANESTHESIA/SEDATION: Fentanyl 100 mcg IV; Versed 2 mg IV Sedation Time: 14 Minutes; The patient was continuously monitored during the procedure by the interventional radiology nurse under my direct supervision. COMPLICATIONS: None immediate. PROCEDURE: Informed consent was obtained from the patient following an explanation of the procedure, risks, benefits and alternatives. The patient understands, agrees and consents for the procedure. All questions were addressed. A time out was performed prior to the initiation of the procedure. The patient was positioned prone and non-contrast localization CT was performed of the pelvis to demonstrate the iliac marrow spaces. The operative site was prepped and draped in the usual sterile  fashion. Under sterile conditions and local anesthesia, a 22 gauge spinal needle was utilized for procedural planning. Next, an 11 gauge coaxial bone biopsy needle was advanced into the left iliac marrow space. Needle position was confirmed with CT imaging. Initially, a bone marrow aspiration was attempted to be performed however no marrow was able to be aspirated. Next, a bone marrow biopsy was obtained with the 11 gauge outer bone marrow device. The 11 gauge coaxial bone biopsy needle was re-advanced into a slightly different location within the left iliac marrow space, positioning was confirmed with CT imaging and an additional bone marrow biopsy was obtained. Samples were prepared with the cytotechnologist and deemed adequate. The needle was removed and superficial hemostasis was obtained with manual compression. A dressing was applied. The patient tolerated the procedure well without immediate post procedural complication. IMPRESSION: Successful CT guided left iliac bone marrow core biopsy. Electronically Signed   By: Sandi Mariscal M.D.   On: 10/19/2020 10:24   CT BONE MARROW BIOPSY & ASPIRATION  Result Date: 10/19/2020 INDICATION: History of multiple myeloma, currently undergoing treatment. Please perform CT-guided bone marrow biopsy and aspiration to evaluate for response to therapy. EXAM: CT-GUIDED BONE MARROW BIOPSY AND ASPIRATION MEDICATIONS: None  ANESTHESIA/SEDATION: Fentanyl 100 mcg IV; Versed 2 mg IV Sedation Time: 14 Minutes; The patient was continuously monitored during the procedure by the interventional radiology nurse under my direct supervision. COMPLICATIONS: None immediate. PROCEDURE: Informed consent was obtained from the patient following an explanation of the procedure, risks, benefits and alternatives. The patient understands, agrees and consents for the procedure. All questions were addressed. A time out was performed prior to the initiation of the procedure. The patient was positioned  prone and non-contrast localization CT was performed of the pelvis to demonstrate the iliac marrow spaces. The operative site was prepped and draped in the usual sterile fashion. Under sterile conditions and local anesthesia, a 22 gauge spinal needle was utilized for procedural planning. Next, an 11 gauge coaxial bone biopsy needle was advanced into the left iliac marrow space. Needle position was confirmed with CT imaging. Initially, a bone marrow aspiration was attempted to be performed however no marrow was able to be aspirated. Next, a bone marrow biopsy was obtained with the 11 gauge outer bone marrow device. The 11 gauge coaxial bone biopsy needle was re-advanced into a slightly different location within the left iliac marrow space, positioning was confirmed with CT imaging and an additional bone marrow biopsy was obtained. Samples were prepared with the cytotechnologist and deemed adequate. The needle was removed and superficial hemostasis was obtained with manual compression. A dressing was applied. The patient tolerated the procedure well without immediate post procedural complication. IMPRESSION: Successful CT guided left iliac bone marrow core biopsy. Electronically Signed   By: Sandi Mariscal M.D.   On: 10/19/2020 10:24   ECHOCARDIOGRAM COMPLETE  Result Date: 10/30/2020    ECHOCARDIOGRAM REPORT   Patient Name:   Bruce Smith Date of Exam: 10/30/2020 Medical Rec #:  [id number]    Height:       68.0 in Accession #:    [id number]   Weight:       171.0 lb Date of Birth:  1951-01-24     BSA:          1.912 m Patient Age:    14 years     BP:           105/51 mmHg Patient Gender: M            HR:           63 bpm. Exam Location:  Inpatient Procedure: 2D Echo, Cardiac Doppler and Color Doppler Indications:    Stroke 434.91 / I163.9  History:        Patient has no prior history of Echocardiogram examinations.                 Previous Myocardial Infarction and CAD; Risk                 Factors:Hypertension,  Dyslipidemia and Former Smoker.  Sonographer:    Vickie Epley RDCS Referring Phys: [id number] [place]  1. Left ventricular ejection fraction, by estimation, is 60 to 65%. The left ventricle has normal function. The left ventricle has no regional wall motion abnormalities. Left ventricular diastolic parameters were normal. Elevated left atrial pressure.  2. Right ventricular systolic function is normal. The right ventricular size is normal. There is normal pulmonary artery systolic pressure.  3. The mitral valve is normal in structure. No evidence of mitral valve regurgitation. No evidence of mitral stenosis.  4. The aortic valve is normal in structure. There is mild calcification of the aortic valve. There is mild  thickening of the aortic valve. Aortic valve regurgitation is not visualized. No aortic stenosis is present.  5. The inferior vena cava is normal in size with greater than 50% respiratory variability, suggesting right atrial pressure of 3 mmHg. FINDINGS  Left Ventricle: Left ventricular ejection fraction, by estimation, is 60 to 65%. The left ventricle has normal function. The left ventricle has no regional wall motion abnormalities. The left ventricular internal cavity size was normal in size. There is  no left ventricular hypertrophy. Left ventricular diastolic parameters were normal. Elevated left atrial pressure. Right Ventricle: The right ventricular size is normal. No increase in right ventricular wall thickness. Right ventricular systolic function is normal. There is normal pulmonary artery systolic pressure. The tricuspid regurgitant velocity is 1.92 m/s, and  with an assumed right atrial pressure of 3 mmHg, the estimated right ventricular systolic pressure is 38.1 mmHg. Left Atrium: Left atrial size was normal in size. Right Atrium: Right atrial size was normal in size. Pericardium: There is no evidence of pericardial effusion. Mitral Valve: The mitral valve is normal in structure.  No evidence of mitral valve regurgitation. No evidence of mitral valve stenosis. Tricuspid Valve: The tricuspid valve is normal in structure. Tricuspid valve regurgitation is not demonstrated. No evidence of tricuspid stenosis. Aortic Valve: The aortic valve is normal in structure. There is mild calcification of the aortic valve. There is mild thickening of the aortic valve. Aortic valve regurgitation is not visualized. No aortic stenosis is present. Pulmonic Valve: The pulmonic valve was normal in structure. Pulmonic valve regurgitation is not visualized. No evidence of pulmonic stenosis. Aorta: The aortic root is normal in size and structure. Venous: The inferior vena cava is normal in size with greater than 50% respiratory variability, suggesting right atrial pressure of 3 mmHg. IAS/Shunts: No atrial level shunt detected by color flow Doppler.  LEFT VENTRICLE PLAX 2D LVIDd:         4.80 cm      Diastology LVIDs:         3.40 cm      LV e' medial:    6.41 cm/s LV PW:         0.80 cm      LV E/e' medial:  11.6 LV IVS:        0.80 cm      LV e' lateral:   7.70 cm/s LVOT diam:     2.20 cm      LV E/e' lateral: 9.7 LV SV:         86 LV SV Index:   45 LVOT Area:     3.80 cm  LV Volumes (MOD) LV vol d, MOD A2C: 105.0 ml LV vol d, MOD A4C: 121.0 ml LV vol s, MOD A2C: 44.7 ml LV vol s, MOD A4C: 57.1 ml LV SV MOD A2C:     60.3 ml LV SV MOD A4C:     121.0 ml LV SV MOD BP:      64.5 ml RIGHT VENTRICLE RV S prime:     10.50 cm/s TAPSE (M-mode): 2.4 cm LEFT ATRIUM           Index       RIGHT ATRIUM           Index LA diam:      3.00 cm 1.57 cm/m  RA Area:     12.80 cm LA Vol (A2C): 30.7 ml 16.06 ml/m RA Volume:   26.60 ml  13.91 ml/m LA Vol (A4C): 25.9 ml  13.55 ml/m  AORTIC VALVE LVOT Vmax:   93.80 cm/s LVOT Vmean:  63.500 cm/s LVOT VTI:    0.227 m  AORTA Ao Root diam: 3.50 cm MITRAL VALVE               TRICUSPID VALVE MV Area (PHT): 3.72 cm    TR Peak grad:   14.7 mmHg MV Decel Time: 204 msec    TR Vmax:        192.00  cm/s MV E velocity: 74.60 cm/s MV A velocity: 71.10 cm/s  SHUNTS MV E/A ratio:  1.05        Systemic VTI:  0.23 m                            Systemic Diam: 2.20 cm Dani Gobble Croitoru MD Electronically signed by Sanda Klein MD Signature Date/Time: 10/30/2020/12:52:36 PM    Final    DG FEMUR, MIN 2 VIEWS RIGHT  Result Date: 10/13/2020 CLINICAL DATA:  Pain.  History of multiple myeloma EXAM: RIGHT FEMUR 2 VIEWS COMPARISON:  None. FINDINGS: Frontal and lateral views were obtained. No blastic or lytic bone lesions evident. No abnormal periosteal reaction. No fracture or dislocation. There is chondrocalcinosis in the knee joint region. No knee joint effusion. There is narrowing of the patellofemoral joint. There are foci of arterial vascular calcification. IMPRESSION: 1. No blastic or lytic bone lesions. No abnormal periosteal reaction. 2. Chondrocalcinosis in the knee joint, a finding that may be seen with osteoarthritis or calcium pyrophosphate deposition disease. 3.  Narrowing patellofemoral joint. 4.  No fracture or dislocation. Electronically Signed   By: Lowella Grip III M.D.   On: 10/13/2020 12:33   DG Hips Bilat W or Wo Pelvis 3-4 Views  Result Date: 10/26/2020 CLINICAL DATA:  Pain following fall EXAM: DG HIP (WITH OR WITHOUT PELVIS) 3-4V BILAT COMPARISON:  None. FINDINGS: Frontal pelvis as well as frontal and lateral views of each hip joint-total 5-views obtained. There is postoperative screw and nail fixation in the left femur with alignment anatomic. Tip of screw in proximal left femur. No acute fracture or dislocation. There is mild symmetric narrowing of each hip joint. There is bony overgrowth along the superolateral right acetabulum. Sacroiliac joints appear unremarkable. IMPRESSION: Postoperative changes in the left femur. Slight narrowing of each hip joint. Bony overgrowth along the superolateral right acetabulum likely of arthropathic etiology. No acute fracture or dislocation.  Electronically Signed   By: Lowella Grip III M.D.   On: 10/26/2020 12:11   VAS [place] LOWER EXTREMITY VENOUS (DVT)  Result Date: 10/27/2020  Lower Venous DVT Study Indications: Swelling.  Risk Factors: Cancer. Comparison Study: No prior studies. Performing Technologist: Oliver Hum RVT  Examination Guidelines: A complete evaluation includes B-mode imaging, spectral Doppler, color Doppler, and power Doppler as needed of all accessible portions of each vessel. Bilateral testing is considered an integral part of a complete examination. Limited examinations for reoccurring indications may be performed as noted. The reflux portion of the exam is performed with the patient in reverse Trendelenburg.  +-----+---------------+---------+-----------+----------+--------------+ RIGHTCompressibilityPhasicitySpontaneityPropertiesThrombus Aging +-----+---------------+---------+-----------+----------+--------------+ CFV  Full           Yes      Yes                                 +-----+---------------+---------+-----------+----------+--------------+   +---------+---------------+---------+-----------+----------+--------------+ LEFT     CompressibilityPhasicitySpontaneityPropertiesThrombus Aging +---------+---------------+---------+-----------+----------+--------------+ CFV  Full           Yes      Yes                                 +---------+---------------+---------+-----------+----------+--------------+ SFJ      Full                                                        +---------+---------------+---------+-----------+----------+--------------+ FV Prox  Full                                                        +---------+---------------+---------+-----------+----------+--------------+ FV Mid   Full                                                        +---------+---------------+---------+-----------+----------+--------------+ FV DistalFull                                                         +---------+---------------+---------+-----------+----------+--------------+ PFV      Full                                                        +---------+---------------+---------+-----------+----------+--------------+ POP      Full           Yes      Yes                                 +---------+---------------+---------+-----------+----------+--------------+ PTV      Full                                                        +---------+---------------+---------+-----------+----------+--------------+ PERO     Full                                                        +---------+---------------+---------+-----------+----------+--------------+     Summary: RIGHT: - No evidence of common femoral vein obstruction.  LEFT: - There is no evidence of deep vein thrombosis in the lower extremity.  - No cystic structure found in the popliteal fossa.  *See table(s) above for measurements and observations. Electronically signed by Servando Snare MD on 10/27/2020 at 2:20:16 PM.  Final     Microbiology: Recent Results (from the past 240 hour(s))  SARS CORONAVIRUS 2 (TAT 6-24 HRS) Nasopharyngeal Nasopharyngeal Swab     Status: None   Collection Time: 10/26/20  2:45 PM   Specimen: Nasopharyngeal Swab  Result Value Ref Range Status   SARS Coronavirus 2 NEGATIVE NEGATIVE Final    Comment: (NOTE) SARS-CoV-2 target nucleic acids are NOT DETECTED.  The SARS-CoV-2 RNA is generally detectable in upper and lower respiratory specimens during the acute phase of infection. Negative results do not preclude SARS-CoV-2 infection, do not rule out co-infections with other pathogens, and should not be used as the sole basis for treatment or other patient management decisions. Negative results must be combined with clinical observations, patient history, and epidemiological information. The expected result is Negative.  Fact Sheet for  Patients: SugarRoll.be  Fact Sheet for Healthcare Providers: https://www.woods-mathews.com/  This test is not yet approved or cleared by the [place] FDA and  has been authorized for detection and/or diagnosis of SARS-CoV-2 by FDA under an Emergency Use Authorization (EUA). This EUA will remain  in effect (meaning this test can be used) for the duration of the COVID-19 declaration under Se ction 564(b)(1) of the Act, 21 U.S.C. section 360bbb-3(b)(1), unless the authorization is terminated or revoked sooner.  Performed at [facility], [place] [address], [place], [place] [address]   SARS CORONAVIRUS 2 (TAT 6-24 HRS) Nasopharyngeal Nasopharyngeal Swab     Status: None   Collection Time: 10/29/20  1:00 PM   Specimen: Nasopharyngeal Swab  Result Value Ref Range Status   SARS Coronavirus 2 NEGATIVE NEGATIVE Final    Comment: (NOTE) SARS-CoV-2 target nucleic acids are NOT DETECTED.  The SARS-CoV-2 RNA is generally detectable in upper and lower respiratory specimens during the acute phase of infection. Negative results do not preclude SARS-CoV-2 infection, do not rule out co-infections with other pathogens, and should not be used as the sole basis for treatment or other patient management decisions. Negative results must be combined with clinical observations, patient history, and epidemiological information. The expected result is Negative.  Fact Sheet for Patients: SugarRoll.be  Fact Sheet for Healthcare Providers: https://www.woods-mathews.com/  This test is not yet approved or cleared by the [place] FDA and  has been authorized for detection and/or diagnosis of SARS-CoV-2 by FDA under an Emergency Use Authorization (EUA). This EUA will remain  in effect (meaning this test can be used) for the duration of the COVID-19 declaration under Se ction 564(b)(1) of the Act, 21 U.S.C. section  360bbb-3(b)(1), unless the authorization is terminated or revoked sooner.  Performed at [facility], [place] [address], [place], [place] [address]      Labs: Basic Metabolic Panel: Recent Labs  Lab 10/27/20 0258 10/29/20 1138 10/30/20 0614 10/31/20 0538 11/01/20 0552  NA 136 134* 139 140 139  K 3.9 4.7 4.8 5.4* 4.3  CL 104 103 108 111 111  CO2 24 21* 22 21* 19*  GLUCOSE 107* 96 98 98 97  BUN 16 63* 63* 50* 36*  CREATININE 0.65 3.69* 3.30* 2.60* 1.75*  CALCIUM 8.3* 7.3* 6.8* 7.1* 7.0*   Liver Function Tests: Recent Labs  Lab 10/27/20 0258 10/29/20 1138 11/01/20 0552  AST 11* 14* 15  ALT 16 16 34  ALKPHOS 122 103 98  BILITOT 0.8 0.8 0.7  PROT 5.5* 5.3* 5.2*  ALBUMIN 3.3* 3.2* 2.7*   No results for input(s): LIPASE, AMYLASE in the last 168 hours. No results for  input(s): AMMONIA in the last 168 hours. CBC: Recent Labs  Lab 10/26/20 1236 10/27/20 0258 10/29/20 1138 10/30/20 0614 10/31/20 0538 11/01/20 0552  WBC 9.1 5.0 10.8* 9.1 8.3 6.5  NEUTROABS 7.6  --  9.6*  --   --   --   HGB 12.2* 11.3* 10.4* 10.4* 10.2* 10.4*  HCT 36.7* 34.0* 31.7* 31.7* 31.6* 32.0*  MCV 90.2 91.2 92.4 91.9 93.5 91.7  PLT 214 196 167 163 165 186   Cardiac Enzymes: Recent Labs  Lab 10/26/20 1236  CKTOTAL 27*   BNP: BNP (last 3 results) No results for input(s): BNP in the last 8760 hours.  ProBNP (last 3 results) No results for input(s): PROBNP in the last 8760 hours.  CBG: Recent Labs  Lab 10/29/20 1028  GLUCAP 83       Signed:  Nita Sells MD   [facility] 11/01/2020, 12:44 PM

## 2020-11-01 NOTE — Progress Notes (Signed)
Orthostatic VS:  Lying:                     Sitting                   Standing BP 130/65              121/80                 117/64 P       78                 80                           94 R        17                18                          19  O2     97                  96                         98 Temp  98.3

## 2020-11-01 NOTE — Progress Notes (Signed)
AVS reviewed and questions answered.  Patient alert and oriented, skin warm and dry.  Transported to main lobby and care transferred to family/friend without incident.  

## 2020-11-02 ENCOUNTER — Telehealth: Payer: Self-pay | Admitting: *Deleted

## 2020-11-02 ENCOUNTER — Other Ambulatory Visit: Payer: Self-pay | Admitting: *Deleted

## 2020-11-02 LAB — SURGICAL PATHOLOGY

## 2020-11-02 MED ORDER — TRAMADOL HCL 50 MG PO TABS: 50.0000 mg | ORAL_TABLET | Freq: Three times a day (TID) | ORAL | 0 refills | Status: DC | PRN

## 2020-11-02 NOTE — Telephone Encounter (Signed)
Rcd call from [facility] asking if patient still needs to come tomorrow for treatment since he just got out of hospital. Yes per Dr Myna Hidalgo.  appt to see DR Myna Hidalgo added on to his schedule

## 2020-11-03 ENCOUNTER — Inpatient Hospital Stay: Payer: BC Managed Care – PPO | Attending: Hematology & Oncology

## 2020-11-03 ENCOUNTER — Inpatient Hospital Stay: Payer: BC Managed Care – PPO

## 2020-11-03 ENCOUNTER — Encounter: Payer: Self-pay | Admitting: Hematology & Oncology

## 2020-11-03 ENCOUNTER — Other Ambulatory Visit: Payer: Self-pay

## 2020-11-03 ENCOUNTER — Inpatient Hospital Stay (HOSPITAL_BASED_OUTPATIENT_CLINIC_OR_DEPARTMENT_OTHER): Payer: BC Managed Care – PPO | Admitting: Hematology & Oncology

## 2020-11-03 VITALS — BP 137/79 | HR 107 | Temp 98.2°F | Resp 18 | Wt 169.0 lb

## 2020-11-03 DIAGNOSIS — Z7982 Long term (current) use of aspirin: Secondary | ICD-10-CM | POA: Insufficient documentation

## 2020-11-03 DIAGNOSIS — Z5111 Encounter for antineoplastic chemotherapy: Secondary | ICD-10-CM | POA: Insufficient documentation

## 2020-11-03 DIAGNOSIS — R110 Nausea: Secondary | ICD-10-CM | POA: Diagnosis not present

## 2020-11-03 DIAGNOSIS — C419 Malignant neoplasm of bone and articular cartilage, unspecified: Secondary | ICD-10-CM

## 2020-11-03 DIAGNOSIS — Z79899 Other long term (current) drug therapy: Secondary | ICD-10-CM | POA: Insufficient documentation

## 2020-11-03 DIAGNOSIS — C9000 Multiple myeloma not having achieved remission: Secondary | ICD-10-CM | POA: Diagnosis not present

## 2020-11-03 DIAGNOSIS — N289 Disorder of kidney and ureter, unspecified: Secondary | ICD-10-CM | POA: Insufficient documentation

## 2020-11-03 DIAGNOSIS — Z87311 Personal history of (healed) other pathological fracture: Secondary | ICD-10-CM | POA: Insufficient documentation

## 2020-11-03 LAB — CMP (CANCER CENTER ONLY)
ALT: 44 U/L (ref 0–44)
AST: 19 U/L (ref 15–41)
Albumin: 3.6 g/dL (ref 3.5–5.0)
Alkaline Phosphatase: 99 U/L (ref 38–126)
Anion gap: 10 (ref 5–15)
BUN: 29 mg/dL — ABNORMAL HIGH (ref 8–23)
CO2: 22 mmol/L (ref 22–32)
Calcium: 8.9 mg/dL (ref 8.9–10.3)
Chloride: 105 mmol/L (ref 98–111)
Creatinine: 1.33 mg/dL — ABNORMAL HIGH (ref 0.61–1.24)
GFR, Estimated: 58 mL/min — ABNORMAL LOW (ref >60)
Glucose, Bld: 102 mg/dL — ABNORMAL HIGH (ref 70–99)
Potassium: 4.5 mmol/L (ref 3.5–5.1)
Sodium: 137 mmol/L (ref 135–145)
Total Bilirubin: 0.5 mg/dL (ref 0.3–1.2)
Total Protein: 5.9 g/dL — ABNORMAL LOW (ref 6.5–8.1)

## 2020-11-03 LAB — CBC WITH DIFFERENTIAL (CANCER CENTER ONLY)
Abs Immature Granulocytes: 0.08 10*3/uL — ABNORMAL HIGH (ref 0.00–0.07)
Basophils Absolute: 0 10*3/uL (ref 0.0–0.1)
Basophils Relative: 0 %
Eosinophils Absolute: 0.1 10*3/uL (ref 0.0–0.5)
Eosinophils Relative: 1 %
HCT: 35.4 % — ABNORMAL LOW (ref 39.0–52.0)
Hemoglobin: 11.7 g/dL — ABNORMAL LOW (ref 13.0–17.0)
Immature Granulocytes: 1 %
Lymphocytes Relative: 4 %
Lymphs Abs: 0.3 10*3/uL — ABNORMAL LOW (ref 0.7–4.0)
MCH: 30.5 pg (ref 26.0–34.0)
MCHC: 33.1 g/dL (ref 30.0–36.0)
MCV: 92.4 fL (ref 80.0–100.0)
Monocytes Absolute: 0.8 10*3/uL (ref 0.1–1.0)
Monocytes Relative: 9 %
Neutro Abs: 7.9 10*3/uL — ABNORMAL HIGH (ref 1.7–7.7)
Neutrophils Relative %: 85 %
Platelet Count: 250 10*3/uL (ref 150–400)
RBC: 3.83 MIL/uL — ABNORMAL LOW (ref 4.22–5.81)
RDW: 16.3 % — ABNORMAL HIGH (ref 11.5–15.5)
WBC Count: 9.2 10*3/uL (ref 4.0–10.5)
nRBC: 0 % (ref 0.0–0.2)

## 2020-11-03 MED ORDER — DENOSUMAB 120 MG/1.7ML ~~LOC~~ SOLN
120.0000 mg | Freq: Once | SUBCUTANEOUS | Status: AC
  Administered 2020-11-03: 120 mg via SUBCUTANEOUS

## 2020-11-03 MED ORDER — FAMOTIDINE 20 MG PO TABS
40.0000 mg | ORAL_TABLET | Freq: Once | ORAL | Status: AC
  Administered 2020-11-03: 40 mg via ORAL

## 2020-11-03 MED ORDER — DEXAMETHASONE 4 MG PO TABS
ORAL_TABLET | ORAL | Status: AC
  Filled 2020-11-03: qty 5

## 2020-11-03 MED ORDER — DARATUMUMAB-HYALURONIDASE-FIHJ 1800-30000 MG-UT/15ML ~~LOC~~ SOLN
1800.0000 mg | Freq: Once | SUBCUTANEOUS | Status: AC
  Administered 2020-11-03: 1800 mg via SUBCUTANEOUS
  Filled 2020-11-03: qty 15

## 2020-11-03 MED ORDER — DIPHENHYDRAMINE HCL 25 MG PO CAPS
ORAL_CAPSULE | ORAL | Status: AC
  Filled 2020-11-03: qty 2

## 2020-11-03 MED ORDER — ACETAMINOPHEN 325 MG PO TABS
ORAL_TABLET | ORAL | Status: AC
  Filled 2020-11-03: qty 2

## 2020-11-03 MED ORDER — DEXAMETHASONE 4 MG PO TABS
20.0000 mg | ORAL_TABLET | Freq: Once | ORAL | Status: AC
  Administered 2020-11-03: 20 mg via ORAL

## 2020-11-03 MED ORDER — SODIUM CHLORIDE 0.9 % IV SOLN
Freq: Once | INTRAVENOUS | Status: DC
  Filled 2020-11-03: qty 250

## 2020-11-03 MED ORDER — BORTEZOMIB CHEMO SQ INJECTION 3.5 MG (2.5MG/ML)
1.3000 mg/m2 | Freq: Once | INTRAMUSCULAR | Status: AC
Start: 2020-11-03 — End: 2020-11-03
  Administered 2020-11-03: 2.75 mg via SUBCUTANEOUS
  Filled 2020-11-03: qty 1.1

## 2020-11-03 MED ORDER — ACETAMINOPHEN 325 MG PO TABS
650.0000 mg | ORAL_TABLET | Freq: Once | ORAL | Status: AC
  Administered 2020-11-03: 650 mg via ORAL

## 2020-11-03 MED ORDER — DENOSUMAB 120 MG/1.7ML ~~LOC~~ SOLN
SUBCUTANEOUS | Status: AC
  Filled 2020-11-03: qty 1.7

## 2020-11-03 MED ORDER — METOCLOPRAMIDE HCL 10 MG PO TABS: 10.0000 mg | ORAL_TABLET | Freq: Three times a day (TID) | ORAL | 3 refills | Status: DC

## 2020-11-03 MED ORDER — DIPHENHYDRAMINE HCL 25 MG PO CAPS
50.0000 mg | ORAL_CAPSULE | Freq: Once | ORAL | Status: AC
  Administered 2020-11-03: 50 mg via ORAL

## 2020-11-03 NOTE — Addendum Note (Signed)
Addended by: Arlan Organ R on: 11/03/2020 10:58 AM   Modules accepted: Orders

## 2020-11-03 NOTE — Patient Instructions (Signed)
[facility] Discharge Instructions for Patients Receiving Chemotherapy  Today you received the following chemotherapy agents Velcade, Darzalex  To help prevent nausea and vomiting after your treatment, we encourage you to take your nausea medication    If you develop nausea and vomiting that is not controlled by your nausea medication, call the clinic.   BELOW ARE SYMPTOMS THAT SHOULD BE REPORTED IMMEDIATELY:  *FEVER GREATER THAN 100.5 F  *CHILLS WITH OR WITHOUT FEVER  NAUSEA AND VOMITING THAT IS NOT CONTROLLED WITH YOUR NAUSEA MEDICATION  *UNUSUAL SHORTNESS OF BREATH  *UNUSUAL BRUISING OR BLEEDING  TENDERNESS IN MOUTH AND THROAT WITH OR WITHOUT PRESENCE OF ULCERS  *URINARY PROBLEMS  *BOWEL PROBLEMS  UNUSUAL RASH Items with * indicate a potential emergency and should be followed up as soon as possible.  Feel free to call the clinic should you have any questions or concerns. The clinic phone number is [phone].  Please show the CHEMO ALERT CARD at check-in to the Emergency Department and triage nurse.    

## 2020-11-03 NOTE — Progress Notes (Signed)
Pt discharged in no apparent distress. Pt left ambulatory without assistance. Pt aware of discharge instructions and verbalized understanding and had no further questions.  

## 2020-11-03 NOTE — Progress Notes (Signed)
Hematology and Oncology Follow Up Visit  Sofian Kallstrom TD:[id number] 30-Apr-1951 70 y.o. 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 Mcmeans is back for follow-up.  Unfortunately, since we last saw him, he has been in the hospital quite a bit.  He has been in with issues with pain.  He has been in with issues of urinary retention.  He was impacted.  He is doing much better right now.  He just got out of the hospital a couple days ago.  Thankfully, his last bone marrow test actually did not show any obvious myeloma.  The bone marrow test was done on 10/19/2020.  The pathology report ([id number]) showed a hypocellular marrow with no obvious plasma cells.  There were none of cells for FISH study and cytogenetics.  He had a PET scan which was improved.  I have spoken with Dr. Laverta Baltimore at [facility].  He feels that a transplant would be reasonable.  We will get Mr. Blaschko out there in a couple weeks.  He comes in with his sister today.  She is looking quite good.  Is urinating okay.  Is on laxatives now.  He had enemas while he was in the hospital.  He had a renal insufficiency.  This also is improved.  His creatinine today is 1.33.  He seems to be eating a little bit better.  He does have nausea.  Nausea is always been a big problem for him.  We will see about Reglan.  This might actually help with the constipation.  He has had no fever.  He has had no rashes.  There has been no leg swelling.  Overall, his performance status is ECOG 1.    Medications:  Current Outpatient Medications:  .   acetaminophen (TYLENOL) 500 MG tablet, Take 1,000 mg by mouth every 6 (six) hours., Disp: , Rfl:  .  aspirin 81 MG EC tablet, Take 81 mg by mouth daily. Swallow whole., Disp: , Rfl:  .  atorvastatin (LIPITOR) 40 MG tablet, Take 20 mg by mouth daily., Disp: , Rfl:  .  Coenzyme Q10 (CO Q-10) 200 MG CAPS, Take 200 mg by mouth daily., Disp: , Rfl:  .  dexamethasone (DECADRON) 4 MG tablet, Take 4 mg by mouth daily. Take one tablet for 3 days after chemotherapy., Disp: , Rfl:  .  famciclovir (FAMVIR) 500 MG tablet, Take 1 tablet (500 mg total) by mouth daily., Disp: 30 tablet, Rfl: 8 .  fentaNYL (DURAGESIC) 25 MCG/HR, Place 1 patch onto the skin every 3 (three) days., Disp: 10 patch, Rfl: 0 .  HYDROmorphone (DILAUDID) 2 MG tablet, Take 0.5 tablets (1 mg total) by mouth every 12 (twelve) hours as needed for up to 12 days for severe pain., Disp: 24 tablet, Rfl: 0 .  megestrol (MEGACE ES) 625 MG/5ML suspension, Take 5 mLs (625 mg total) by mouth daily., Disp: 150 mL, Rfl: 4 .  nitroGLYCERIN (NITROSTAT) 0.4 MG SL tablet, Place 0.4 mg under the tongue every 5 (five) minutes as needed for chest pain., Disp: , Rfl:  .  omeprazole (PRILOSEC) 40 MG capsule, Take 40 mg by mouth daily., Disp: , Rfl:  .  polyethylene glycol (MIRALAX) 17 g packet, Take 17 g by mouth daily., Disp: 30 each, Rfl: 1 .  Polyethylene Glycol 3350 (MIRALAX PO), Take 2 Scoops by mouth daily as needed (constipation)., Disp: , Rfl:  .  prasugrel (EFFIENT) 10 MG TABS tablet, Take 10 mg by mouth daily., Disp: , Rfl:  .  pregabalin (LYRICA) 100 MG capsule, Take 1 capsule (100 mg total) by mouth daily., Disp: 60 capsule, Rfl: 0 .  sennosides-docusate sodium (SENOKOT-S) 8.6-50 MG tablet, Take 2 tablets by mouth daily. (Patient taking differently: Take 2 tablets by mouth daily as needed for constipation.), Disp: 30 tablet, Rfl: 1 .  silver sulfADIAZINE (SILVADENE) 1 % cream, Apply 1 application topically 2 (two) times daily. (Patient taking  differently: Apply 1 application topically 2 (two) times daily as needed (bed sores).), Disp: 50 g, Rfl: 0 .  traMADol (ULTRAM) 50 MG tablet, Take 1 tablet (50 mg total) by mouth 3 (three) times daily as needed for moderate pain., Disp: 90 tablet, Rfl: 0 .  LORazepam (ATIVAN) 0.5 MG tablet, Take 1 tablet (0.5 mg total) by mouth every 6 (six) hours as needed for anxiety (nausea). (Patient not taking: Reported on 11/03/2020), Disp: 40 tablet, Rfl: 0  Allergies: No Known Allergies  Past Medical History, Surgical history, Social history, and Family History were reviewed and updated.  Review of Systems: Review of Systems  Constitutional: Negative.   HENT:  Negative.   Eyes: Negative.   Respiratory: Negative.   Cardiovascular: Negative.   Gastrointestinal: Negative.   Endocrine: Negative.   Genitourinary: Negative.    Musculoskeletal: Positive for arthralgias.  Skin: Negative.   Neurological: Negative.   Hematological: Negative.   Psychiatric/Behavioral: Negative.     Physical Exam:  weight is 169 lb (76.7 kg). His oral temperature is 98.2 F (36.8 C). His blood pressure is 137/79 and his pulse is 107 (abnormal). His respiration is 18 and oxygen saturation is 100%.   Wt Readings from Last 3 Encounters:  11/03/20 169 lb (76.7 kg)  10/29/20 171 lb (77.6 kg)  10/26/20 171 lb (77.6 kg)    Physical Exam Vitals reviewed.  HENT:     Head: Normocephalic and atraumatic.  Eyes:     Pupils: Pupils are equal, round, and reactive to light.  Cardiovascular:     Rate and Rhythm: Normal rate and regular rhythm.     Heart sounds: Normal heart sounds.  Pulmonary:     Effort: Pulmonary effort is normal.     Breath sounds: Normal breath sounds.  Abdominal:     General: Bowel sounds are normal.     Palpations: Abdomen is soft.  Musculoskeletal:        General: No tenderness or deformity. Normal range of motion.     Cervical back: Normal range of motion.     Comments: He has the left hip  surgical scar that is healed.  He has some decreased range of motion of the left hip area.  He has some discomfort in the left shoulder blade.  He does seem to have little bit of better range of motion in this area.  Lymphadenopathy:     Cervical: No cervical adenopathy.  Skin:    General: Skin is warm and dry.     Findings: No erythema or rash.  Neurological:     Mental Status: He is alert and oriented to person, place, and time.  Psychiatric:        Behavior: Behavior normal.        Thought Content: Thought content normal.        Judgment: Judgment normal.    Lab Results  Component Value Date   WBC 9.2 11/03/2020   HGB 11.7 (L) 11/03/2020   HCT 35.4 (L) 11/03/2020   MCV 92.4 11/03/2020   PLT 250 11/03/2020     Chemistry      Component Value Date/Time   NA 137 11/03/2020 0821   K 4.5 11/03/2020 0821   CL 105 11/03/2020 0821   CO2 22 11/03/2020 0821   BUN 29 (H) 11/03/2020 0821   CREATININE 1.33 (H) 11/03/2020 0821      Component Value Date/Time   CALCIUM 8.9 11/03/2020 0821   ALKPHOS 99 11/03/2020 0821   AST 19 11/03/2020 0821   ALT 44 11/03/2020 0821   BILITOT 0.5 11/03/2020 0821      Impression and Plan: Mr. Cusmano is a very nice 70 year old white male.  He has kappa light chain myeloma.  I am not to surprised by this as light chain myeloma does tend to involve the bones.  Again, he goes out to [facility] on 19 January.  I think that it probably will be another 6 weeks before he will have the transplant.  For right now, we will go ahead with treatment.  He will get his daratumumab today.  He will get Velcade today and next week.  He will then get 1 dose of daratumumab right before he goes to [facility].  We will get him back to see [place] depending on what [facility] has to do.  We will certainly help out.  We will have to continue him on therapy we certainly can do this as it has worked very nicely.   Volanda Napoleon, MD 1/4/20229:28 AM

## 2020-11-04 DIAGNOSIS — Z7982 Long term (current) use of aspirin: Secondary | ICD-10-CM | POA: Insufficient documentation

## 2020-11-04 LAB — IGG, IGA, IGM
IgA: 39 mg/dL — ABNORMAL LOW (ref 61–437)
IgG (Immunoglobin G), Serum: 309 mg/dL — ABNORMAL LOW (ref 603–1613)
IgM (Immunoglobulin M), Srm: 5 mg/dL — ABNORMAL LOW (ref 20–172)

## 2020-11-04 LAB — KAPPA/LAMBDA LIGHT CHAINS
Kappa free light chain: 8.2 mg/L (ref 3.3–19.4)
Kappa, lambda light chain ratio: 2.83 — ABNORMAL HIGH (ref 0.26–1.65)
Lambda free light chains: 2.9 mg/L — ABNORMAL LOW (ref 5.7–26.3)

## 2020-11-05 LAB — IMMUNOFIXATION REFLEX, SERUM
IgA: 39 mg/dL — ABNORMAL LOW (ref 61–437)
IgG (Immunoglobin G), Serum: 317 mg/dL — ABNORMAL LOW (ref 603–1613)
IgM (Immunoglobulin M), Srm: <5 mg/dL — ABNORMAL LOW (ref 20–172)

## 2020-11-05 LAB — PROTEIN ELECTROPHORESIS, SERUM, WITH REFLEX
A/G Ratio: 1.1 (ref 0.7–1.7)
Albumin ELP: 2.9 g/dL (ref 2.9–4.4)
Alpha-1-Globulin: 0.4 g/dL (ref 0.0–0.4)
Alpha-2-Globulin: 1.3 g/dL — ABNORMAL HIGH (ref 0.4–1.0)
Beta Globulin: 0.7 g/dL (ref 0.7–1.3)
Gamma Globulin: 0.2 g/dL — ABNORMAL LOW (ref 0.4–1.8)
Globulin, Total: 2.6 g/dL (ref 2.2–3.9)
M-Spike, %: 0.1 g/dL — ABNORMAL HIGH
SPEP Interpretation: 0
Total Protein ELP: 5.5 g/dL — ABNORMAL LOW (ref 6.0–8.5)

## 2020-11-09 ENCOUNTER — Other Ambulatory Visit: Payer: Self-pay | Admitting: *Deleted

## 2020-11-09 DIAGNOSIS — C419 Malignant neoplasm of bone and articular cartilage, unspecified: Secondary | ICD-10-CM

## 2020-11-09 DIAGNOSIS — C9000 Multiple myeloma not having achieved remission: Secondary | ICD-10-CM

## 2020-11-10 ENCOUNTER — Other Ambulatory Visit: Payer: Self-pay

## 2020-11-10 ENCOUNTER — Other Ambulatory Visit: Payer: Self-pay | Admitting: *Deleted

## 2020-11-10 ENCOUNTER — Inpatient Hospital Stay: Payer: BC Managed Care – PPO

## 2020-11-10 VITALS — BP 134/74 | HR 99 | Temp 98.2°F | Resp 18

## 2020-11-10 DIAGNOSIS — C9000 Multiple myeloma not having achieved remission: Secondary | ICD-10-CM

## 2020-11-10 DIAGNOSIS — C419 Malignant neoplasm of bone and articular cartilage, unspecified: Secondary | ICD-10-CM

## 2020-11-10 LAB — CBC WITH DIFFERENTIAL (CANCER CENTER ONLY)
Abs Immature Granulocytes: 0.27 10*3/uL — ABNORMAL HIGH (ref 0.00–0.07)
Basophils Absolute: 0 10*3/uL (ref 0.0–0.1)
Basophils Relative: 0 %
Eosinophils Absolute: 0 10*3/uL (ref 0.0–0.5)
Eosinophils Relative: 0 %
HCT: 38.3 % — ABNORMAL LOW (ref 39.0–52.0)
Hemoglobin: 12.9 g/dL — ABNORMAL LOW (ref 13.0–17.0)
Immature Granulocytes: 3 %
Lymphocytes Relative: 5 %
Lymphs Abs: 0.6 10*3/uL — ABNORMAL LOW (ref 0.7–4.0)
MCH: 30.4 pg (ref 26.0–34.0)
MCHC: 33.7 g/dL (ref 30.0–36.0)
MCV: 90.1 fL (ref 80.0–100.0)
Monocytes Absolute: 0.7 10*3/uL (ref 0.1–1.0)
Monocytes Relative: 7 %
Neutro Abs: 8.8 10*3/uL — ABNORMAL HIGH (ref 1.7–7.7)
Neutrophils Relative %: 85 %
Platelet Count: 256 10*3/uL (ref 150–400)
RBC: 4.25 MIL/uL (ref 4.22–5.81)
RDW: 16.8 % — ABNORMAL HIGH (ref 11.5–15.5)
WBC Count: 10.4 10*3/uL (ref 4.0–10.5)
nRBC: 0 % (ref 0.0–0.2)

## 2020-11-10 LAB — CMP (CANCER CENTER ONLY)
ALT: 44 U/L (ref 0–44)
AST: 25 U/L (ref 15–41)
Albumin: 3.7 g/dL (ref 3.5–5.0)
Alkaline Phosphatase: 93 U/L (ref 38–126)
Anion gap: 12 (ref 5–15)
BUN: 29 mg/dL — ABNORMAL HIGH (ref 8–23)
CO2: 19 mmol/L — ABNORMAL LOW (ref 22–32)
Calcium: 8.7 mg/dL — ABNORMAL LOW (ref 8.9–10.3)
Chloride: 102 mmol/L (ref 98–111)
Creatinine: 1.07 mg/dL (ref 0.61–1.24)
GFR, Estimated: >60 mL/min (ref >60)
Glucose, Bld: 150 mg/dL — ABNORMAL HIGH (ref 70–99)
Potassium: 4.4 mmol/L (ref 3.5–5.1)
Sodium: 133 mmol/L — ABNORMAL LOW (ref 135–145)
Total Bilirubin: 0.6 mg/dL (ref 0.3–1.2)
Total Protein: 6.1 g/dL — ABNORMAL LOW (ref 6.5–8.1)

## 2020-11-10 MED ORDER — HYDROMORPHONE HCL 2 MG PO TABS: 1.0000 mg | ORAL_TABLET | Freq: Two times a day (BID) | ORAL | 0 refills | Status: AC | PRN

## 2020-11-10 MED ORDER — DEXAMETHASONE 4 MG PO TABS
ORAL_TABLET | ORAL | Status: AC
  Filled 2020-11-10: qty 5

## 2020-11-10 MED ORDER — ACETAMINOPHEN 325 MG PO TABS
ORAL_TABLET | ORAL | Status: AC
  Filled 2020-11-10: qty 2

## 2020-11-10 MED ORDER — DEXAMETHASONE 4 MG PO TABS
20.0000 mg | ORAL_TABLET | Freq: Once | ORAL | Status: AC
  Administered 2020-11-10: 20 mg via ORAL

## 2020-11-10 MED ORDER — FENTANYL 25 MCG/HR TD PT72: 1.0000 | MEDICATED_PATCH | TRANSDERMAL | 0 refills | Status: DC

## 2020-11-10 MED ORDER — BORTEZOMIB CHEMO SQ INJECTION 3.5 MG (2.5MG/ML)
1.3000 mg/m2 | Freq: Once | INTRAMUSCULAR | Status: AC
  Administered 2020-11-10: 2.75 mg via SUBCUTANEOUS
  Filled 2020-11-10: qty 1.1

## 2020-11-10 MED ORDER — ACETAMINOPHEN 325 MG PO TABS
650.0000 mg | ORAL_TABLET | Freq: Once | ORAL | Status: AC
  Administered 2020-11-10: 650 mg via ORAL

## 2020-11-10 NOTE — Patient Instructions (Signed)
Bortezomib injection What is this medicine? BORTEZOMIB (bor TEZ oh mib) targets proteins in cancer cells and stops the cancer cells from growing. It treats multiple myeloma and mantle cell lymphoma. This medicine may be used for other purposes; ask your health care provider or pharmacist if you have questions. COMMON BRAND NAME(S): Velcade What should I tell my health care provider before I take this medicine? They need to know if you have any of these conditions:  dehydration  diabetes (high blood sugar)  heart disease  liver disease  tingling of the fingers or toes or other nerve disorder  an unusual or allergic reaction to bortezomib, mannitol, boron, other medicines, foods, dyes, or preservatives  pregnant or trying to get pregnant  breast-feeding How should I use this medicine? This medicine is injected into a vein or under the skin. It is given by a health care provider in a hospital or clinic setting. Talk to your health care provider about the use of this medicine in children. Special care may be needed. Overdosage: If you think you have taken too much of this medicine contact a poison control center or emergency room at once. NOTE: This medicine is only for you. Do not share this medicine with others. What if I miss a dose? Keep appointments for follow-up doses. It is important not to miss your dose. Call your health care provider if you are unable to keep an appointment. What may interact with this medicine? This medicine may interact with the following medications:  ketoconazole  rifampin This list may not describe all possible interactions. Give your health care provider a list of all the medicines, herbs, non-prescription drugs, or dietary supplements you use. Also tell them if you smoke, drink alcohol, or use illegal drugs. Some items may interact with your medicine. What should I watch for while using this medicine? Your condition will be monitored carefully while  you are receiving this medicine. You may need blood work done while you are taking this medicine. You may get drowsy or dizzy. Do not drive, use machinery, or do anything that needs mental alertness until you know how this medicine affects you. Do not stand up or sit up quickly, especially if you are an older patient. This reduces the risk of dizzy or fainting spells This medicine may increase your risk of getting an infection. Call your health care provider for advice if you get a fever, chills, sore throat, or other symptoms of a cold or flu. Do not treat yourself. Try to avoid being around people who are sick. Check with your health care provider if you have severe diarrhea, nausea, and vomiting, or if you sweat a lot. The loss of too much body fluid may make it dangerous for you to take this medicine. Do not become pregnant while taking this medicine or for 7 months after stopping it. Women should inform their health care provider if they wish to become pregnant or think they might be pregnant. Men should not father a child while taking this medicine and for 4 months after stopping it. There is a potential for serious harm to an unborn child. Talk to your health care provider for more information. Do not breast-feed an infant while taking this medicine or for 2 months after stopping it. This medicine may make it more difficult to get pregnant or father a child. Talk to your health care provider if you are concerned about your fertility. What side effects may I notice from receiving this medicine?   Side effects that you should report to your doctor or health care professional as soon as possible:  allergic reactions (skin rash; itching or hives; swelling of the face, lips, or tongue)  bleeding (bloody or black, tarry stools; red or dark brown urine; spitting up blood or brown material that looks like coffee grounds; red spots on the skin; unusual bruising or bleeding from the eye, gums, or  nose)  blurred vision or changes in vision  confusion  constipation  headache  heart failure (trouble breathing; fast, irregular heartbeat; sudden weight gain; swelling of the ankles, feet, hands)  infection (fever, chills, cough, sore throat, pain or trouble passing urine)  lack or loss of appetite  liver injury (dark yellow or brown urine; general ill feeling or flu-like symptoms; loss of appetite, right upper belly pain; yellowing of the eyes or skin)  low blood pressure (dizziness; feeling faint or lightheaded, falls; unusually weak or tired)  muscle cramps  pain, redness, or irritation at site where injected  pain, tingling, numbness in the hands or feet  seizures  trouble breathing  unusual bruising or bleeding Side effects that usually do not require medical attention (report to your doctor or health care professional if they continue or are bothersome):  diarrhea  nausea  stomach pain  trouble sleeping  vomiting This list may not describe all possible side effects. Call your doctor for medical advice about side effects. You may report side effects to FDA at 1-800-FDA-1088. Where should I keep my medicine? This medicine is given in a hospital or clinic. It will not be stored at home. NOTE: This sheet is a summary. It may not cover all possible information. If you have questions about this medicine, talk to your doctor, pharmacist, or health care provider.  2021 Elsevier/Gold Standard (2020-10-08 13:22:53)

## 2020-11-12 ENCOUNTER — Telehealth: Payer: Self-pay

## 2020-11-12 NOTE — Telephone Encounter (Signed)
S/w pt regarding possible delay and or closure due to weather on 11/16/20    aom

## 2020-11-13 ENCOUNTER — Telehealth: Payer: Self-pay

## 2020-11-13 NOTE — Telephone Encounter (Signed)
11/16/20 office not opening until 11 due to weather. Pt req to still see dr [name] and r/s chemo at a Later date    aom

## 2020-11-16 ENCOUNTER — Emergency Department (HOSPITAL_COMMUNITY): Payer: BC Managed Care – PPO

## 2020-11-16 ENCOUNTER — Inpatient Hospital Stay: Payer: BC Managed Care – PPO

## 2020-11-16 ENCOUNTER — Encounter (HOSPITAL_COMMUNITY): Payer: Self-pay | Admitting: Obstetrics and Gynecology

## 2020-11-16 ENCOUNTER — Emergency Department (HOSPITAL_COMMUNITY)
Admission: EM | Admit: 2020-11-16 | Discharge: 2020-11-16 | Disposition: A | Discharge status: Home / Self Care | Payer: BC Managed Care – PPO | Attending: Emergency Medicine | Admitting: Emergency Medicine

## 2020-11-16 ENCOUNTER — Ambulatory Visit: Payer: BC Managed Care – PPO | Admitting: Hematology & Oncology

## 2020-11-16 ENCOUNTER — Inpatient Hospital Stay: Payer: BC Managed Care – PPO | Admitting: Hematology & Oncology

## 2020-11-16 ENCOUNTER — Other Ambulatory Visit: Payer: BC Managed Care – PPO

## 2020-11-16 ENCOUNTER — Telehealth: Payer: Self-pay | Admitting: *Deleted

## 2020-11-16 ENCOUNTER — Other Ambulatory Visit: Payer: Self-pay

## 2020-11-16 DIAGNOSIS — I10 Essential (primary) hypertension: Secondary | ICD-10-CM | POA: Insufficient documentation

## 2020-11-16 DIAGNOSIS — I2510 Atherosclerotic heart disease of native coronary artery without angina pectoris: Secondary | ICD-10-CM | POA: Insufficient documentation

## 2020-11-16 DIAGNOSIS — Z79899 Other long term (current) drug therapy: Secondary | ICD-10-CM | POA: Diagnosis not present

## 2020-11-16 DIAGNOSIS — Z85830 Personal history of malignant neoplasm of bone: Secondary | ICD-10-CM | POA: Insufficient documentation

## 2020-11-16 DIAGNOSIS — W19XXXA Unspecified fall, initial encounter: Secondary | ICD-10-CM

## 2020-11-16 DIAGNOSIS — Z7982 Long term (current) use of aspirin: Secondary | ICD-10-CM | POA: Diagnosis not present

## 2020-11-16 DIAGNOSIS — W1830XA Fall on same level, unspecified, initial encounter: Secondary | ICD-10-CM | POA: Insufficient documentation

## 2020-11-16 DIAGNOSIS — Z87891 Personal history of nicotine dependence: Secondary | ICD-10-CM | POA: Insufficient documentation

## 2020-11-16 DIAGNOSIS — R630 Anorexia: Secondary | ICD-10-CM | POA: Insufficient documentation

## 2020-11-16 DIAGNOSIS — M25562 Pain in left knee: Secondary | ICD-10-CM | POA: Insufficient documentation

## 2020-11-16 DIAGNOSIS — S82832A Other fracture of upper and lower end of left fibula, initial encounter for closed fracture: Secondary | ICD-10-CM

## 2020-11-16 LAB — CBC WITH DIFFERENTIAL/PLATELET
Abs Immature Granulocytes: 0.25 10*3/uL — ABNORMAL HIGH (ref 0.00–0.07)
Basophils Absolute: 0 10*3/uL (ref 0.0–0.1)
Basophils Relative: 0 %
Eosinophils Absolute: 0 10*3/uL (ref 0.0–0.5)
Eosinophils Relative: 0 %
HCT: 35.5 % — ABNORMAL LOW (ref 39.0–52.0)
Hemoglobin: 12.1 g/dL — ABNORMAL LOW (ref 13.0–17.0)
Immature Granulocytes: 2 %
Lymphocytes Relative: 5 %
Lymphs Abs: 0.6 10*3/uL — ABNORMAL LOW (ref 0.7–4.0)
MCH: 31 pg (ref 26.0–34.0)
MCHC: 34.1 g/dL (ref 30.0–36.0)
MCV: 91 fL (ref 80.0–100.0)
Monocytes Absolute: 0.9 10*3/uL (ref 0.1–1.0)
Monocytes Relative: 8 %
Neutro Abs: 9.1 10*3/uL — ABNORMAL HIGH (ref 1.7–7.7)
Neutrophils Relative %: 85 %
Platelets: 185 10*3/uL (ref 150–400)
RBC: 3.9 MIL/uL — ABNORMAL LOW (ref 4.22–5.81)
RDW: 16.8 % — ABNORMAL HIGH (ref 11.5–15.5)
WBC: 10.9 10*3/uL — ABNORMAL HIGH (ref 4.0–10.5)
nRBC: 0 % (ref 0.0–0.2)

## 2020-11-16 LAB — COMPREHENSIVE METABOLIC PANEL
ALT: 32 U/L (ref 0–44)
AST: 17 U/L (ref 15–41)
Albumin: 3.4 g/dL — ABNORMAL LOW (ref 3.5–5.0)
Alkaline Phosphatase: 73 U/L (ref 38–126)
Anion gap: 9 (ref 5–15)
BUN: 25 mg/dL — ABNORMAL HIGH (ref 8–23)
CO2: 20 mmol/L — ABNORMAL LOW (ref 22–32)
Calcium: 8.3 mg/dL — ABNORMAL LOW (ref 8.9–10.3)
Chloride: 103 mmol/L (ref 98–111)
Creatinine, Ser: 0.83 mg/dL (ref 0.61–1.24)
GFR, Estimated: >60 mL/min (ref >60)
Glucose, Bld: 102 mg/dL — ABNORMAL HIGH (ref 70–99)
Potassium: 4.4 mmol/L (ref 3.5–5.1)
Sodium: 132 mmol/L — ABNORMAL LOW (ref 135–145)
Total Bilirubin: 0.9 mg/dL (ref 0.3–1.2)
Total Protein: 5.6 g/dL — ABNORMAL LOW (ref 6.5–8.1)

## 2020-11-16 IMAGING — CR DG KNEE COMPLETE 4+V*L*
4 series · 4 of 4 positions shown · non-contrast
Comparison: None.

CLINICAL DATA: Multiple myeloma.  Recent fall

EXAM:
LEFT KNEE - COMPLETE 4+ VIEW

[x knee ap left]
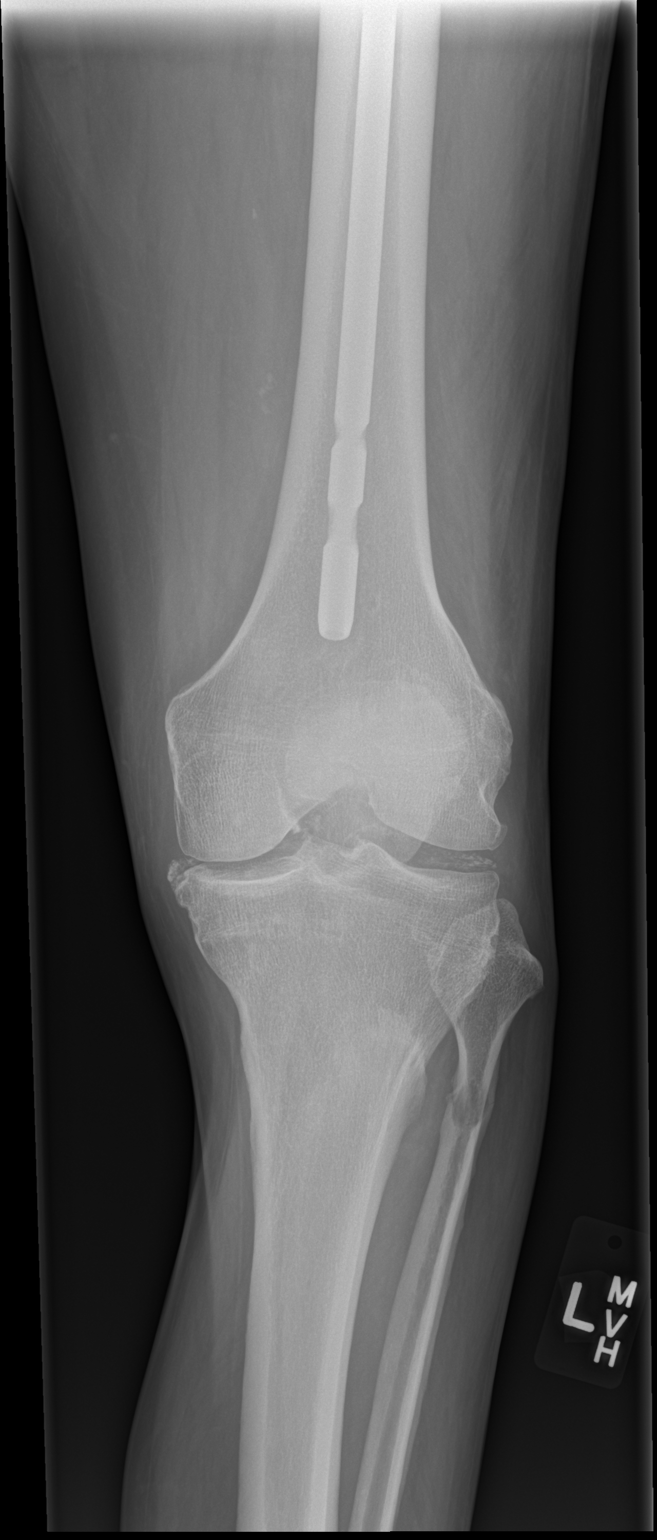

[x knee obl left (1 of 2)]
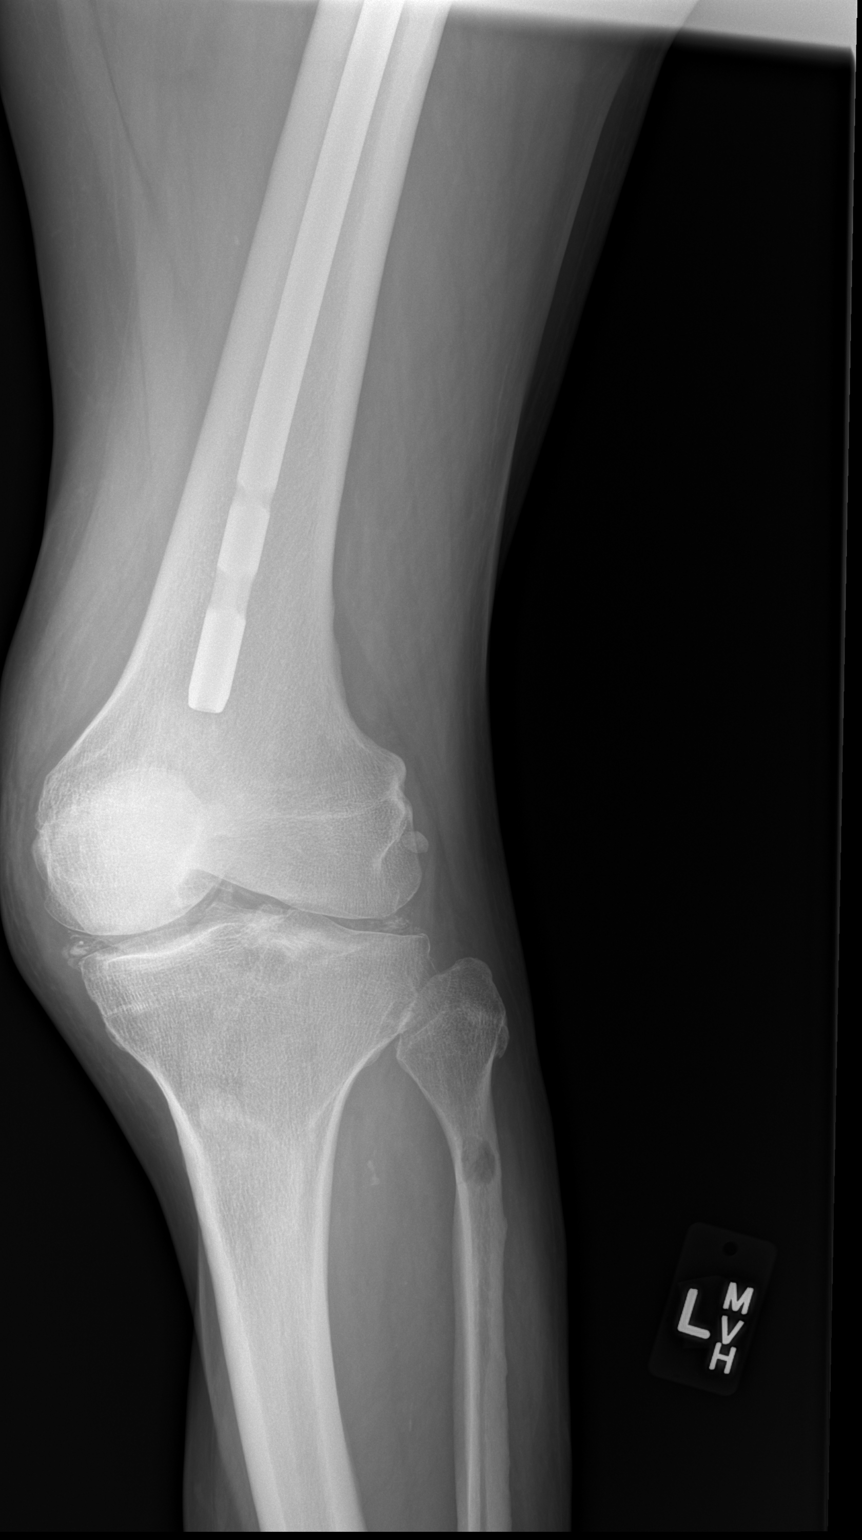

[x knee obl left (2 of 2)]
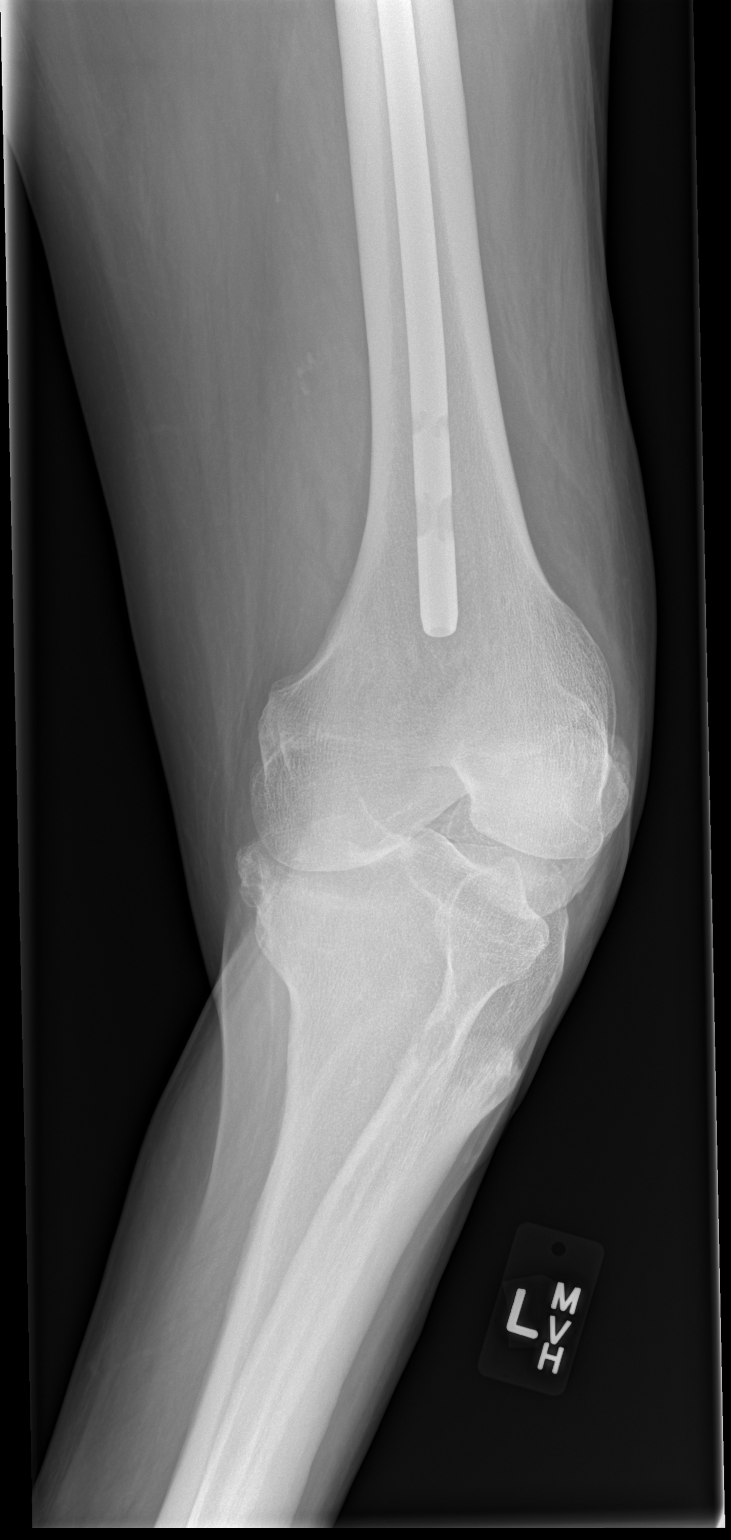

[x knee lat left]
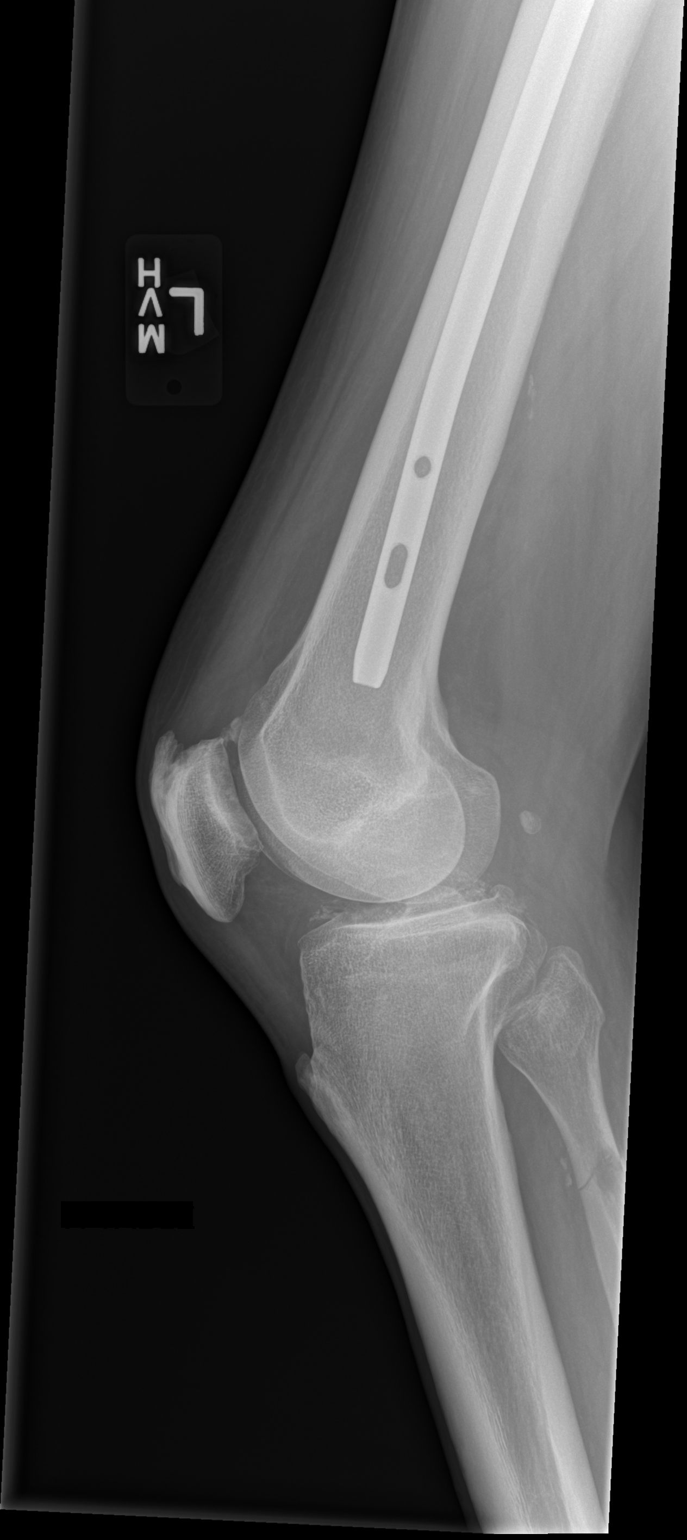

[4 of 4 positions shown; findings below may reference images not displayed]

FINDINGS: Frontal, bilateral oblique, and lateral views were obtained. There
is a lytic lesion in the proximal fibular diaphysis measuring 1.4 x
1.0 cm. There is a fracture along the medial aspect of this with
ache lesion extending through the cortex medially. No other
fracture. No dislocation. No knee joint effusion. There is moderate
narrowing of the patellofemoral joint. Other joint spaces appear
unremarkable. There is extensive chondrocalcinosis. There is a spur
along the anterior superior patella. There is postoperative change
in the visualized mid to lower femur.
IMPRESSION: 1. Lytic lesion consistent with multiple myeloma in the proximal
fibular diaphysis with fracture along the medial aspect of this
lytic lesion.

2. No other fracture. No dislocation. No joint effusion.
Postoperative change noted in distal femur.

3. Extensive chondrocalcinosis, a finding that may be indicative of
osteoarthritis or calcium pyrophosphate deposition disease.

4. Spur along the anterior superior patella is likely due to distal
quadriceps tendinosis. Note moderate joint space narrowing of the
patellofemoral joint.

## 2020-11-16 IMAGING — CR DG TIBIA/FIBULA 2V*L*
2 series · 2 of 2 positions shown · non-contrast
Comparison: Left knee radiograph [DATE]

CLINICAL DATA: Multiple myeloma.  Recent fall

EXAM:
LEFT TIBIA AND FIBULA - 2 VIEW

[x tib-fib lat left]
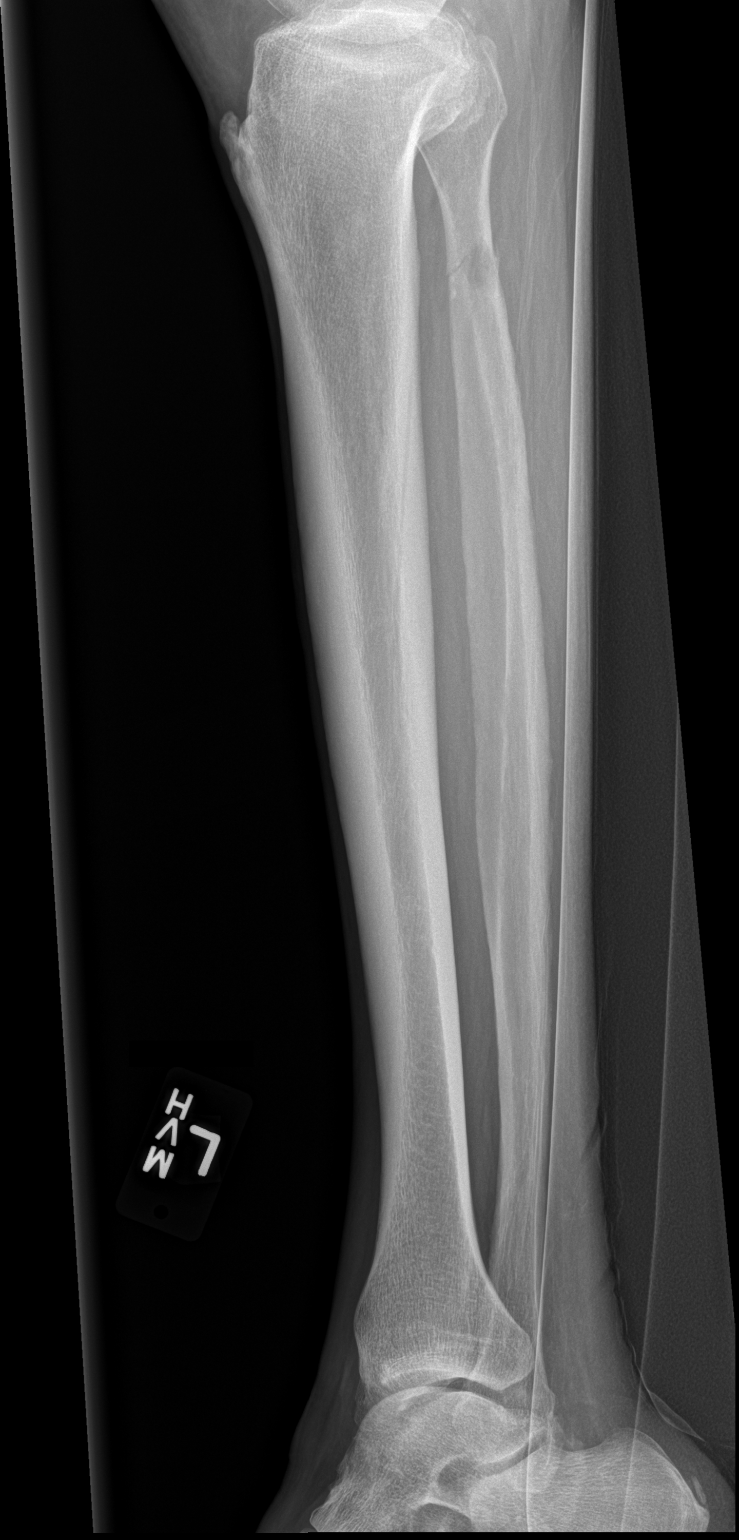

[x tib-fib ap left]
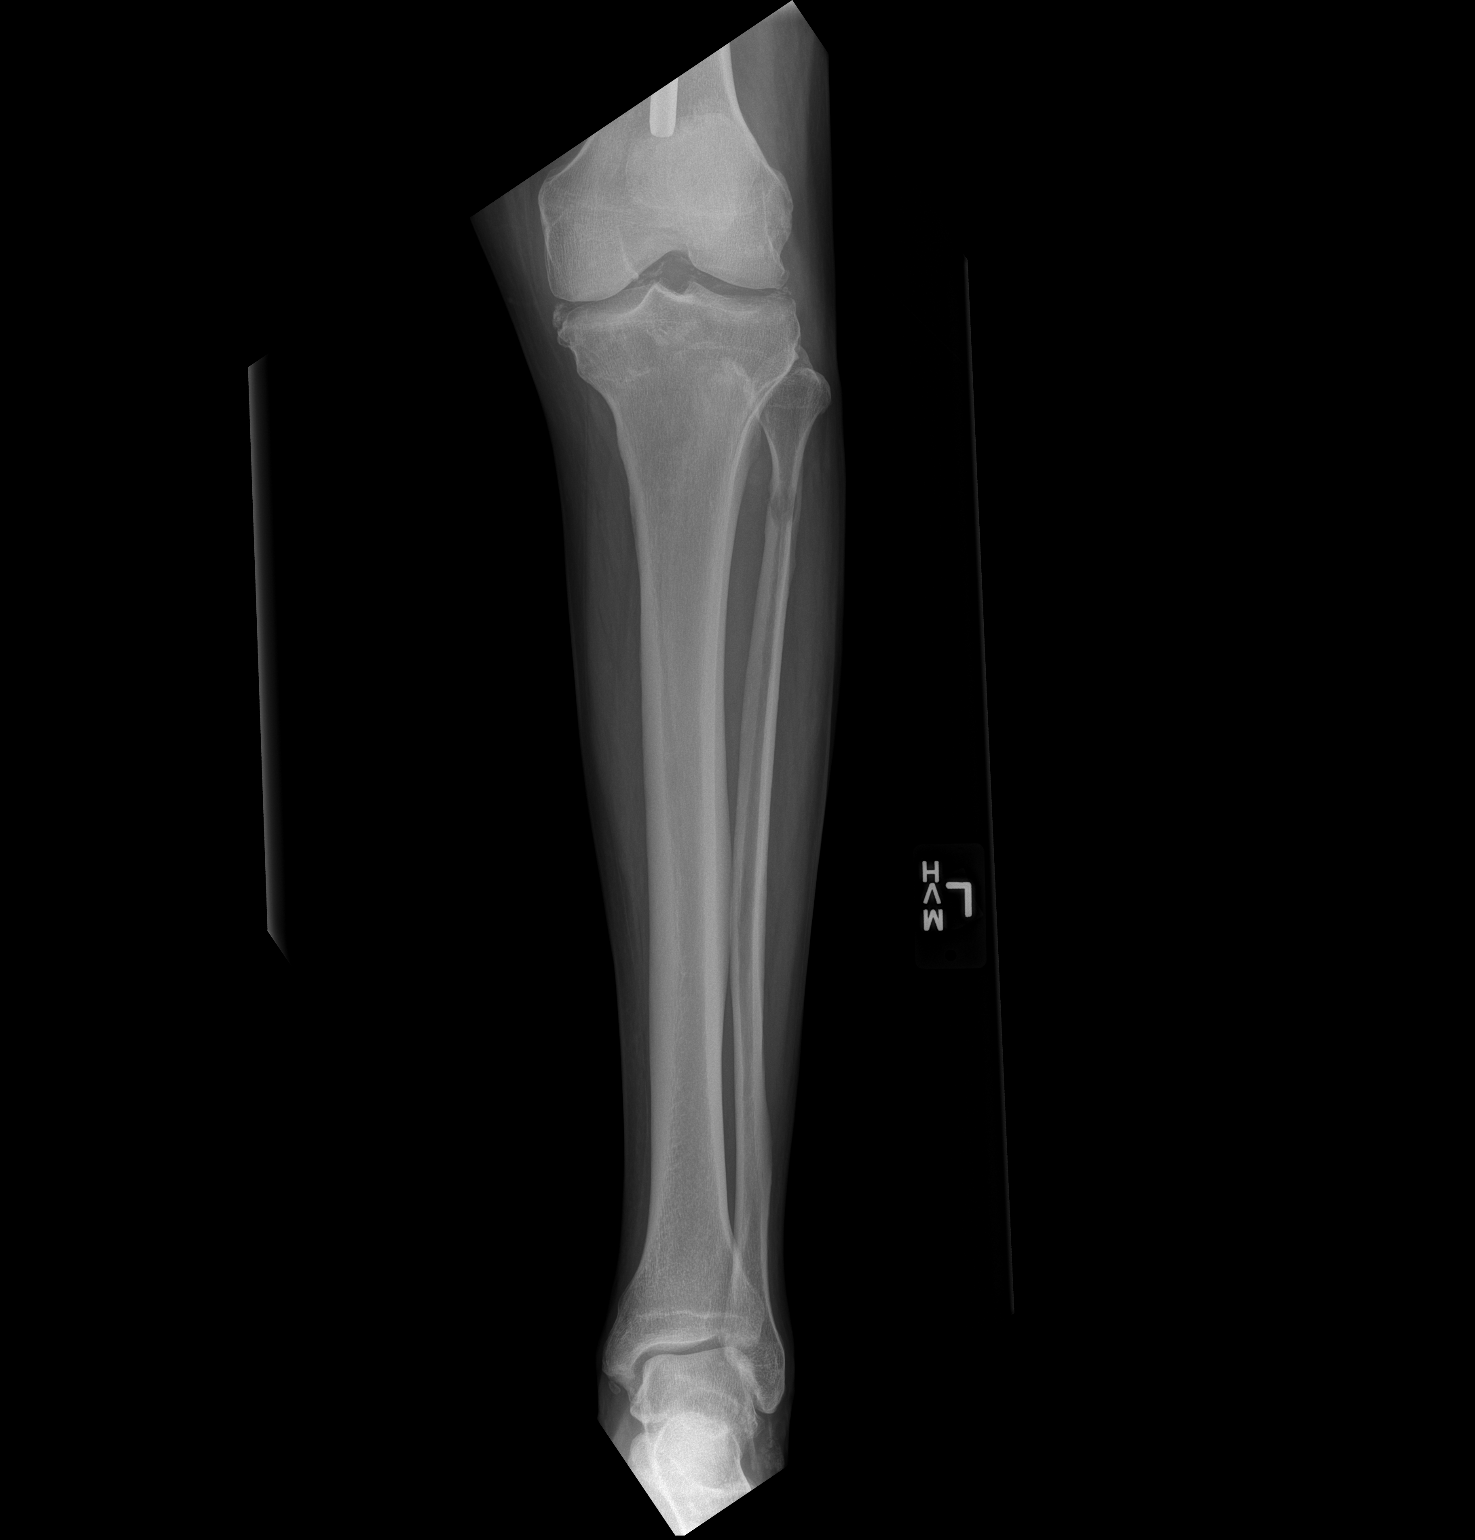

[2 of 2 positions shown; findings below may reference images not displayed]

FINDINGS: Frontal and lateral views obtained. A lytic lesion in the proximal
fibular diaphysis with subtle fracture along the medial aspect of
this lesion is described in the report. No other fracture or lytic
lesion. There is no dislocation. There is chondrocalcinosis in the
knee joint. No erosions.
IMPRESSION: Lytic lesion in the proximal fibular diaphysis. There is a fracture
through this area, better seen on lateral view. No other lytic
lesion or fracture. No dislocation. There is chondrocalcinosis in
the knee joint which may be indicative of osteoarthritis or calcium
pyrophosphate deposition disease.

## 2020-11-16 MED ORDER — HYDROMORPHONE HCL 1 MG/ML IJ SOLN
1.0000 mg | Freq: Once | INTRAMUSCULAR | Status: AC
  Administered 2020-11-16: 1 mg via INTRAVENOUS
  Filled 2020-11-16: qty 1

## 2020-11-16 MED ORDER — SODIUM CHLORIDE 0.9 % IV SOLN: INTRAVENOUS | Status: DC

## 2020-11-16 MED ORDER — SODIUM CHLORIDE 0.9 % IV BOLUS
1000.0000 mL | Freq: Once | INTRAVENOUS | Status: AC
  Administered 2020-11-16: 1000 mL via INTRAVENOUS

## 2020-11-16 NOTE — Progress Notes (Signed)
Orthopedic Tech Progress Note Patient Details:  Bruce Smith [birth year] [id number]  Ortho Devices Type of Ortho Device: Knee Immobilizer Ortho Device/Splint Location: left Ortho Device/Splint Interventions: Application   Post Interventions Patient Tolerated: Well Instructions Provided: Care of device   [name] 11/16/2020, 4:11 PM

## 2020-11-16 NOTE — ED Triage Notes (Signed)
Patient reports to the ER for A fall. Patient reportedly fell on Saturday. Patient is reportedly alert and oriented and has a fentanyl patch on the shoulder. Patient is reportedly having pain in the left leg and has been more lethargic.  VSS per EMS.

## 2020-11-16 NOTE — ED Provider Notes (Signed)
[facility] DEPT Provider Note   CSN: [id number] Arrival date & time: 11/16/20  1340     History Chief Complaint  Patient presents with  . Fall    Bruce Smith is a 70 y.o. male.  70 year old male presents with left knee pain which occurred after a mechanical fall yesterday.  States he has been able to bear weight since then.  Denies any hip discomfort.  No lower back pain.  There is no head or neck trauma from the original fall.  Denies any distal numbness or tingling to his left foot.  Patient is undergoing chemotherapy with last treatment about a week ago.  Denies any recent nausea vomiting or diarrhea but feels that he may be dehydrated.  States that he has had some anorexia.        Past Medical History:  Diagnosis Date  . Atherosclerosis   . Coronary artery disease   . Goals of care, counseling/discussion 06/29/2020  . 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 Hyperlipidemia   . Hypertension   . 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 Multiple myeloma ([place]) 07/21/2020  . Myocardial infarction [facility])     Patient Active Problem List   Diagnosis Date Noted  . AKI (acute kidney injury) ([place]) 10/29/2020  . Abnormal neurological exam 10/29/2020  . Essential hypertension 10/29/2020  . CAD (coronary artery disease) 10/29/2020  . Intractable pain 10/26/2020  . 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 Goals of care, counseling/discussion 06/29/2020  . 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 ANTERIOR CERVICAL DECOMP/DISCECTOMY FUSION N/A 06/17/2020   Procedure: ANTERIOR CERVICAL DECOMPRESSION/DISCECTOMY FUSION, INTERBODY PROSTHESIS, PLATE/SCREWS CERVICAL FIVE- CERVICAL SIX;  Surgeon: Newman Pies, MD;  Location: [facility];   Service: Neurosurgery;  Laterality: N/A;  ANTERIOR CERVICAL DECOMPRESSION/DISCECTOMY FUSION, INTERBODY PROSTHESIS, PLATE/SCREWS CERVICAL FIVE- CERVICAL SIX  . CARDIAC CATHETERIZATION    . FEMUR IM NAIL Left 07/14/2020   Procedure: OPEN REDUCTION INTERNAL FIXATION (ORIF LEFT FEMORAL NAIL FRACTURE;  Surgeon: Marchia Bond, MD;  Location: [facility];  Service: Orthopedics;  Laterality: Left;       History reviewed. No pertinent family history.  Social History   Tobacco Use  . Smoking status: Former Smoker    Quit date: 2000    Years since quitting: 22.0  . Smokeless tobacco: Current User    Types: Chew  Vaping Use  . Vaping Use: Never used  Substance Use Topics  . Alcohol use: Not Currently    Comment: rare  . Drug use: Never    Home Medications Prior to Admission medications   Medication Sig Start Date End Date Taking? Authorizing Provider  acetaminophen (TYLENOL) 500 MG tablet Take 1,000 mg by mouth every 6 (six) hours.    [provider]  aspirin 81 MG EC tablet Take 81 mg by mouth daily. Swallow whole.    [provider]  atorvastatin (LIPITOR) 40 MG tablet Take 20 mg by mouth daily. 09/09/20   [provider]  Coenzyme Q10 (CO Q-10) 200 MG CAPS Take 200 mg by mouth daily.    [provider]  dexamethasone (DECADRON) 4 MG tablet Take 4 mg by mouth daily. Take one tablet for 3 days after chemotherapy.    [provider]  famciclovir (FAMVIR) 500 MG tablet Take 1 tablet (500  mg total) by mouth daily. 08/11/20   Volanda Napoleon, MD  fentaNYL (DURAGESIC) 25 MCG/HR Place 1 patch onto the skin every 3 (three) days. 11/10/20   Volanda Napoleon, MD  HYDROmorphone (DILAUDID) 2 MG tablet Take 0.5 tablets (1 mg total) by mouth every 12 (twelve) hours as needed for severe pain. 11/10/20 12/10/20  Volanda Napoleon, MD  LORazepam (ATIVAN) 0.5 MG tablet Take 1 tablet (0.5 mg total) by mouth every 6 (six) hours as needed for anxiety (nausea). Patient not  taking: Reported on 11/03/2020 07/21/20   Volanda Napoleon, MD  megestrol (MEGACE ES) 625 MG/5ML suspension Take 5 mLs (625 mg total) by mouth daily. 10/28/20   Volanda Napoleon, MD  metoCLOPramide (REGLAN) 10 MG tablet Take 1 tablet (10 mg total) by mouth 4 (four) times daily -  before meals and at bedtime. 11/03/20   Volanda Napoleon, MD  nitroGLYCERIN (NITROSTAT) 0.4 MG SL tablet Place 0.4 mg under the tongue every 5 (five) minutes as needed for chest pain.    [provider]  omeprazole (PRILOSEC) 40 MG capsule Take 40 mg by mouth daily. 08/06/20 08/06/21  [provider]  polyethylene glycol (MIRALAX) 17 g packet Take 17 g by mouth daily. 11/01/20   Nita Sells, MD  Polyethylene Glycol 3350 (MIRALAX PO) Take 2 Scoops by mouth daily as needed (constipation).    [provider]  prasugrel (EFFIENT) 10 MG TABS tablet Take 10 mg by mouth daily. 05/12/20   [provider]  pregabalin (LYRICA) 100 MG capsule Take 1 capsule (100 mg total) by mouth daily. 11/01/20   Nita Sells, MD  sennosides-docusate sodium (SENOKOT-S) 8.6-50 MG tablet Take 2 tablets by mouth daily. Patient taking differently: Take 2 tablets by mouth daily as needed for constipation. 07/14/20   Ventura Bruns, PA-C  silver sulfADIAZINE (SILVADENE) 1 % cream Apply 1 application topically 2 (two) times daily. Patient taking differently: Apply 1 application topically 2 (two) times daily as needed (bed sores). 10/06/20   Volanda Napoleon, MD  traMADol (ULTRAM) 50 MG tablet Take 1 tablet (50 mg total) by mouth 3 (three) times daily as needed for moderate pain. 11/02/20   Volanda Napoleon, MD    Allergies    Patient has no known allergies.  Review of Systems   Review of Systems  All other systems reviewed and are negative.   Physical Exam Updated Vital Signs BP (!) 166/87 (BP Location: Right Arm)   Pulse 66   Temp 98.6 F (37 C) (Oral)   Resp 18   SpO2 100%   Physical Exam Vitals and  nursing note reviewed.  Constitutional:      General: He is not in acute distress.    Appearance: Normal appearance. He is well-developed and well-nourished. He is not toxic-appearing.  HENT:     Head: Normocephalic and atraumatic.  Eyes:     General: Lids are normal.     Extraocular Movements: EOM normal.     Conjunctiva/sclera: Conjunctivae normal.     Pupils: Pupils are equal, round, and reactive to light.  Neck:     Thyroid: No thyroid mass.     Trachea: No tracheal deviation.  Cardiovascular:     Rate and Rhythm: Normal rate and regular rhythm.     Heart sounds: Normal heart sounds. No murmur heard. No gallop.   Pulmonary:     Effort: Pulmonary effort is normal. No respiratory distress.     Breath sounds: Normal  breath sounds. No stridor. No decreased breath sounds, wheezing, rhonchi or rales.  Abdominal:     General: Bowel sounds are normal. There is no distension.     Palpations: Abdomen is soft.     Tenderness: There is no abdominal tenderness. There is no CVA tenderness or rebound.  Musculoskeletal:        General: No tenderness or edema. Normal range of motion.     Cervical back: Normal range of motion and neck supple.       Legs:     Comments: No effusion at the knee.  Some tenderness at the left lateral proximal tibia Full range of motion at the hip joints bilaterally  Skin:    General: Skin is warm and dry.     Findings: No abrasion or rash.  Neurological:     Mental Status: He is alert and oriented to person, place, and time.     GCS: GCS eye subscore is 4. GCS verbal subscore is 5. GCS motor subscore is 6.     Cranial Nerves: No cranial nerve deficit.     Sensory: No sensory deficit.     Deep Tendon Reflexes: Strength normal.  Psychiatric:        Mood and Affect: Mood and affect normal.        Speech: Speech normal.        Behavior: Behavior normal.     ED Results / Procedures / Treatments   Labs (all labs ordered are listed, but only abnormal results  are displayed) Labs Reviewed  CBC WITH DIFFERENTIAL/PLATELET  COMPREHENSIVE METABOLIC PANEL    EKG None  Radiology No results found.  Procedures Procedures (including critical care time)  Medications Ordered in ED Medications  sodium chloride 0.9 % bolus 1,000 mL (has no administration in time range)  0.9 %  sodium chloride infusion (has no administration in time range)    ED Course  I have reviewed the triage vital signs and the nursing notes.  Pertinent labs & imaging results that were available during my care of the patient were reviewed by me and considered in my medical decision making (see chart for details).    MDM Rules/Calculators/A&P                          Patient given IV fluids here.  Does feel somewhat better.  X-ray of patient's left knee shows a lytic lesion at the proximal fibula.  We will place a knee immobilizer for this and have him follow-up with his orthopedist. Final Clinical Impression(s) / ED Diagnoses Final diagnoses:  None    Rx / DC Orders ED Discharge Orders    None       Lacretia Leigh, MD 11/16/20 1559

## 2020-11-16 NOTE — Discharge Instructions (Addendum)
You have a fracture of your proximal fibula.  Call your orthopedist to schedule a follow-up visit

## 2020-11-16 NOTE — Telephone Encounter (Signed)
Returned wife's phone call regarding patient. She stated,"he fell Saturday night in the bathroom. He was lying flat on his back. He fell on the side where he had his hip replaced. I do not know if he hit his head or not. I checked his BP and it was 60/42 using a wrist cuff. His eyes were sort of fixed when I was talking to him. I think he is dehydrated. He didn't want to go to the ER." I instructed her to take him to the ER to be assessed. He might need some imaging studies done. She verbalized understanding.

## 2020-11-18 ENCOUNTER — Other Ambulatory Visit: Payer: BC Managed Care – PPO

## 2020-11-18 ENCOUNTER — Ambulatory Visit: Payer: BC Managed Care – PPO | Admitting: Hematology & Oncology

## 2020-11-18 ENCOUNTER — Telehealth: Payer: Self-pay

## 2020-11-18 ENCOUNTER — Ambulatory Visit: Payer: BC Managed Care – PPO

## 2020-11-18 NOTE — Telephone Encounter (Signed)
Patients wife, Maudie Mercury called and left message stating she needed a call back, they were on there way home from [place].  Called Kim back, she inquired if Albi was scheduled to come back in for another injection or to see Dr.Ennever. Informed her no new appointments have been made and according to Dr.Ennevers last next visit will depend on what [facility] has discussed. Informed her once [facility] has sent over there note, Dr.Ennever will review and someone will contact Shanon Brow of next steps with our office. Kim verbalized understanding and denies any further questions or concerns.

## 2020-11-24 ENCOUNTER — Encounter: Payer: Self-pay | Admitting: *Deleted

## 2020-11-24 ENCOUNTER — Telehealth: Payer: Self-pay | Admitting: Hematology & Oncology

## 2020-11-24 DIAGNOSIS — R768 Other specified abnormal immunological findings in serum: Secondary | ICD-10-CM | POA: Insufficient documentation

## 2020-11-24 NOTE — Progress Notes (Signed)
Patient has been seen at [facility] and is considered eligible for transplant. They are completing work up. Spoke with Dr [name] and he would like to see patient in a few weeks for routine follow up. Message sent to scheduler.   Oncology Nurse Navigator Documentation  Oncology Nurse Navigator Flowsheets 11/24/2020  Abnormal Finding Date -  Confirmed Diagnosis Date -  Diagnosis Status -  Planned Course of Treatment -  Phase of Treatment -  Chemotherapy Actual Start Date: -  Radiation Actual Start Date: -  Radiation Expected End Date: -  Radiation Actual End Date: -  Navigator Follow Up Date: 12/09/2020  Navigator Follow Up Reason: Follow-up Appointment  Navigator Location [facility]  Navigator Encounter Type Appt/Treatment Plan Review  Telephone -  Patient Visit Type MedOnc  Treatment Phase Active Tx  Barriers/Navigation Needs Coordination of Care;Education;Family Concerns  Education -  Interventions Coordination of Care  Acuity Level 2-Minimal Needs (1-2 Barriers Identified)  Referrals -  Coordination of Care Appts  Education Method -  Support Groups/Services Friends and Family  Time Spent with Patient 30

## 2020-11-24 NOTE — Telephone Encounter (Signed)
Called and LMVM for patient regarding appointment that has been added per 1/25 sch msg

## 2020-11-28 ENCOUNTER — Emergency Department (HOSPITAL_COMMUNITY): Payer: BC Managed Care – PPO

## 2020-11-28 ENCOUNTER — Emergency Department (HOSPITAL_COMMUNITY)
Admission: EM | Admit: 2020-11-28 | Discharge: 2020-11-28 | Disposition: A | Discharge status: Home / Self Care | Payer: BC Managed Care – PPO | Attending: Emergency Medicine | Admitting: Emergency Medicine

## 2020-11-28 ENCOUNTER — Other Ambulatory Visit: Payer: Self-pay

## 2020-11-28 ENCOUNTER — Encounter (HOSPITAL_COMMUNITY): Payer: Self-pay

## 2020-11-28 DIAGNOSIS — C9001 Multiple myeloma in remission: Secondary | ICD-10-CM | POA: Diagnosis not present

## 2020-11-28 DIAGNOSIS — Z85830 Personal history of malignant neoplasm of bone: Secondary | ICD-10-CM | POA: Diagnosis not present

## 2020-11-28 DIAGNOSIS — R339 Retention of urine, unspecified: Secondary | ICD-10-CM | POA: Insufficient documentation

## 2020-11-28 DIAGNOSIS — Z87891 Personal history of nicotine dependence: Secondary | ICD-10-CM | POA: Insufficient documentation

## 2020-11-28 DIAGNOSIS — Z7982 Long term (current) use of aspirin: Secondary | ICD-10-CM | POA: Insufficient documentation

## 2020-11-28 DIAGNOSIS — R52 Pain, unspecified: Secondary | ICD-10-CM

## 2020-11-28 DIAGNOSIS — E860 Dehydration: Secondary | ICD-10-CM | POA: Insufficient documentation

## 2020-11-28 DIAGNOSIS — I2510 Atherosclerotic heart disease of native coronary artery without angina pectoris: Secondary | ICD-10-CM | POA: Diagnosis not present

## 2020-11-28 DIAGNOSIS — I119 Hypertensive heart disease without heart failure: Secondary | ICD-10-CM | POA: Diagnosis not present

## 2020-11-28 DIAGNOSIS — E871 Hypo-osmolality and hyponatremia: Secondary | ICD-10-CM | POA: Insufficient documentation

## 2020-11-28 DIAGNOSIS — C9000 Multiple myeloma not having achieved remission: Secondary | ICD-10-CM

## 2020-11-28 DIAGNOSIS — Z79899 Other long term (current) drug therapy: Secondary | ICD-10-CM | POA: Insufficient documentation

## 2020-11-28 DIAGNOSIS — R531 Weakness: Secondary | ICD-10-CM | POA: Diagnosis present

## 2020-11-28 DIAGNOSIS — Z20822 Contact with and (suspected) exposure to covid-19: Secondary | ICD-10-CM | POA: Insufficient documentation

## 2020-11-28 LAB — URINALYSIS, ROUTINE W REFLEX MICROSCOPIC
Bilirubin Urine: NEGATIVE
Glucose, UA: NEGATIVE mg/dL
Hgb urine dipstick: NEGATIVE
Ketones, ur: NEGATIVE mg/dL
Leukocytes,Ua: NEGATIVE
Nitrite: NEGATIVE
Protein, ur: NEGATIVE mg/dL
Specific Gravity, Urine: 1.017 (ref 1.005–1.030)
pH: 6 (ref 5.0–8.0)

## 2020-11-28 LAB — CBC WITH DIFFERENTIAL/PLATELET
Abs Immature Granulocytes: 0.05 10*3/uL (ref 0.00–0.07)
Basophils Absolute: 0 10*3/uL (ref 0.0–0.1)
Basophils Relative: 0 %
Eosinophils Absolute: 0 10*3/uL (ref 0.0–0.5)
Eosinophils Relative: 1 %
HCT: 37.3 % — ABNORMAL LOW (ref 39.0–52.0)
Hemoglobin: 12.6 g/dL — ABNORMAL LOW (ref 13.0–17.0)
Immature Granulocytes: 1 %
Lymphocytes Relative: 4 %
Lymphs Abs: 0.3 10*3/uL — ABNORMAL LOW (ref 0.7–4.0)
MCH: 31 pg (ref 26.0–34.0)
MCHC: 33.8 g/dL (ref 30.0–36.0)
MCV: 91.6 fL (ref 80.0–100.0)
Monocytes Absolute: 0.7 10*3/uL (ref 0.1–1.0)
Monocytes Relative: 13 %
Neutro Abs: 4.8 10*3/uL (ref 1.7–7.7)
Neutrophils Relative %: 81 %
Platelets: 264 10*3/uL (ref 150–400)
RBC: 4.07 MIL/uL — ABNORMAL LOW (ref 4.22–5.81)
RDW: 15.4 % (ref 11.5–15.5)
WBC: 5.9 10*3/uL (ref 4.0–10.5)
nRBC: 0 % (ref 0.0–0.2)

## 2020-11-28 LAB — COMPREHENSIVE METABOLIC PANEL WITH GFR
ALT: 34 U/L (ref 0–44)
AST: 27 U/L (ref 15–41)
Albumin: 3.2 g/dL — ABNORMAL LOW (ref 3.5–5.0)
Alkaline Phosphatase: 85 U/L (ref 38–126)
Anion gap: 11 (ref 5–15)
BUN: 19 mg/dL (ref 8–23)
CO2: 17 mmol/L — ABNORMAL LOW (ref 22–32)
Calcium: 8.2 mg/dL — ABNORMAL LOW (ref 8.9–10.3)
Chloride: 97 mmol/L — ABNORMAL LOW (ref 98–111)
Creatinine, Ser: 0.71 mg/dL (ref 0.61–1.24)
GFR, Estimated: >60 mL/min
Glucose, Bld: 128 mg/dL — ABNORMAL HIGH (ref 70–99)
Potassium: 4.5 mmol/L (ref 3.5–5.1)
Sodium: 125 mmol/L — ABNORMAL LOW (ref 135–145)
Total Bilirubin: 1.1 mg/dL (ref 0.3–1.2)
Total Protein: 5.6 g/dL — ABNORMAL LOW (ref 6.5–8.1)

## 2020-11-28 LAB — SARS CORONAVIRUS 2 BY RT PCR (HOSPITAL ORDER, PERFORMED IN ~~LOC~~ HOSPITAL LAB): SARS Coronavirus 2: NEGATIVE

## 2020-11-28 IMAGING — CR DG LUMBAR SPINE COMPLETE 4+V
5 series · 5 of 5 positions shown · non-contrast
Comparison: Lumbar MRI [DATE]

CLINICAL DATA: Pain. Fall 2 weeks ago with fibular fracture.
History of myeloma.

EXAM:
LUMBAR SPINE - COMPLETE 4+ VIEW

[t lumbar spine ap]
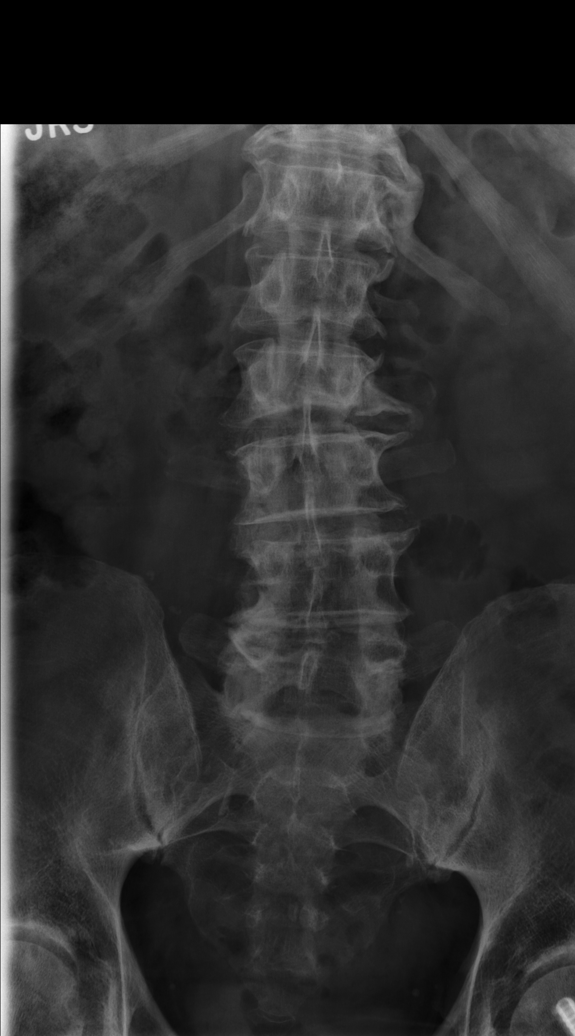

[t lumbar spine obl (1 of 2)]
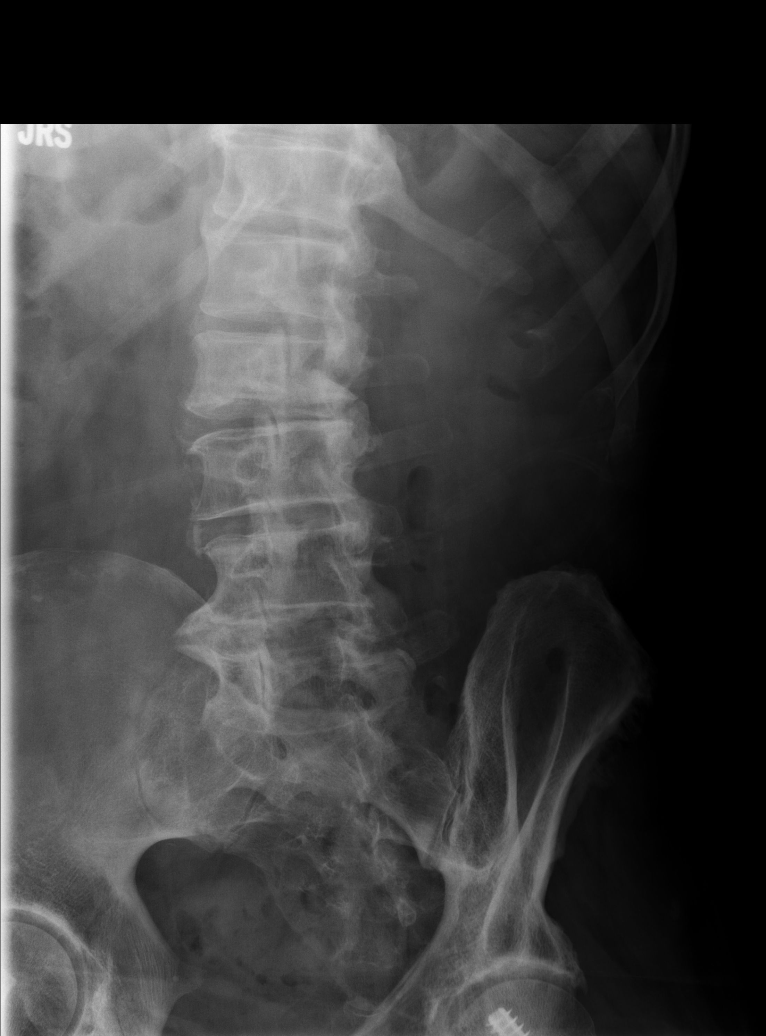

[t lumbar spine obl (2 of 2)]
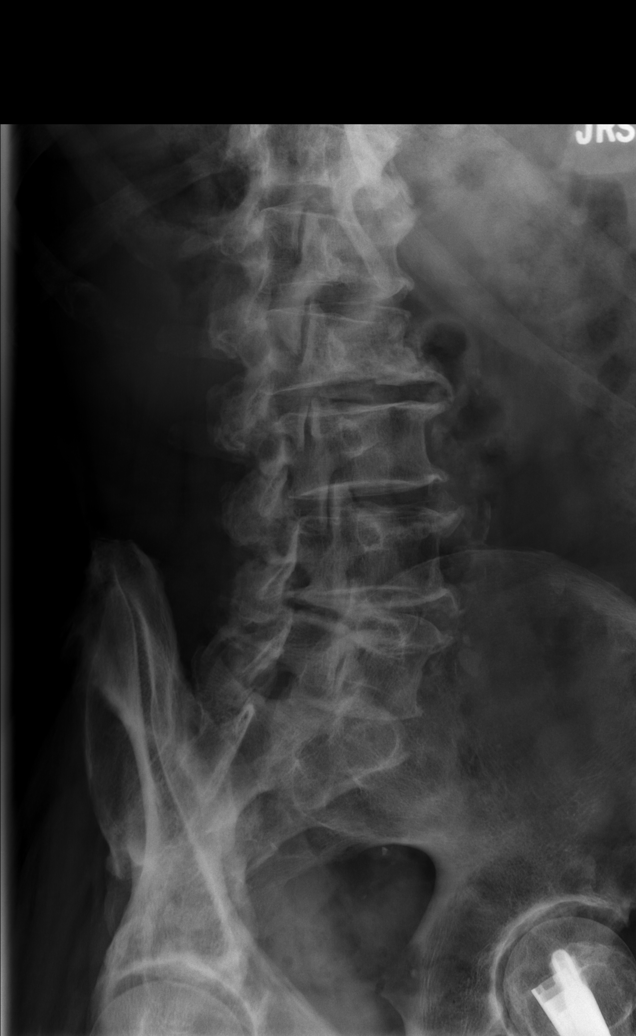

[t lumbar spine lat]
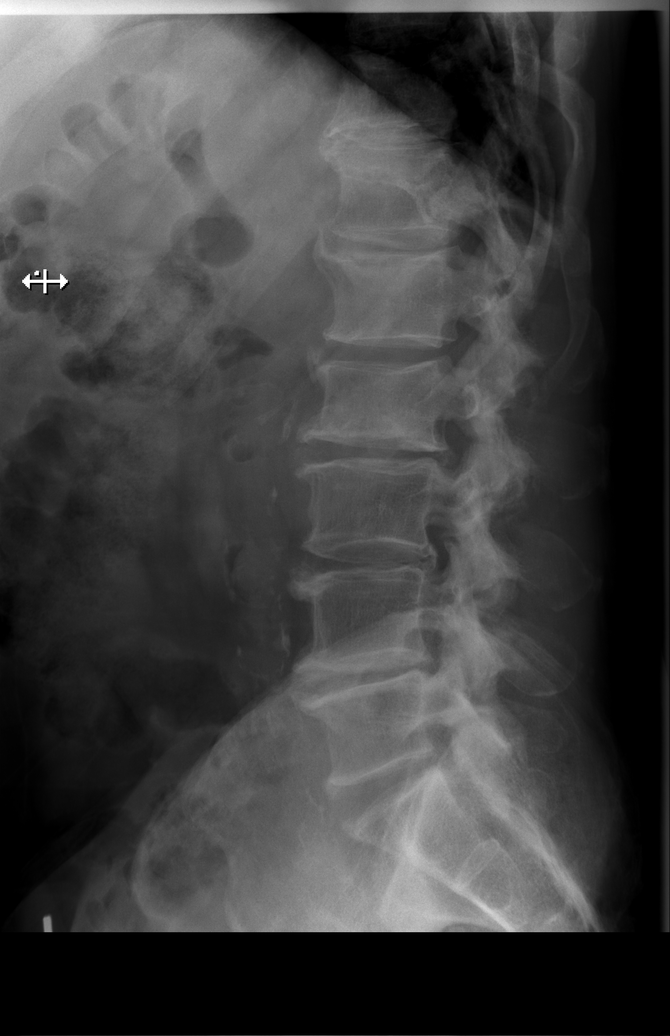

[t lumbar l-5 s-1 spot]
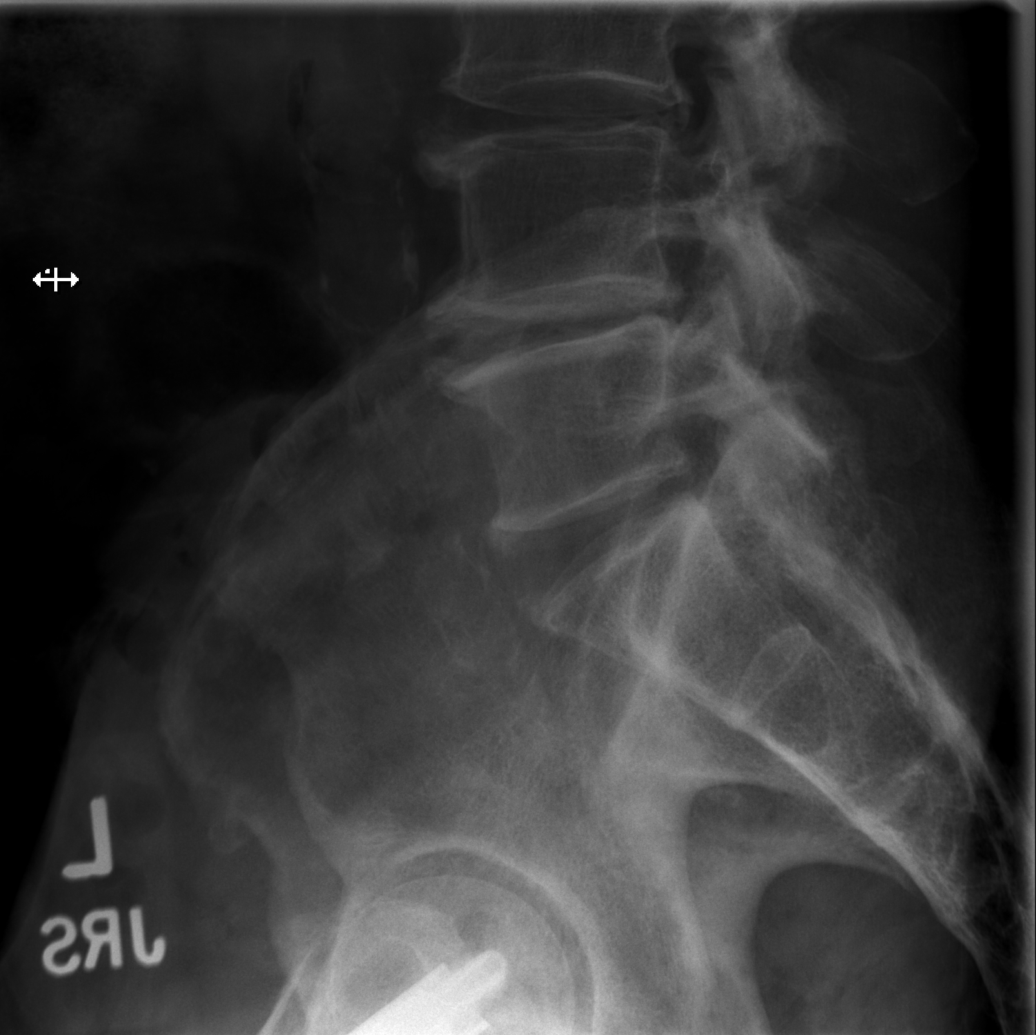

[5 of 5 positions shown; findings below may reference images not displayed]

FINDINGS: Straightening of normal lordosis, unchanged. No listhesis. Stable
concavity of inferior endplate of L2. No acute fracture or acute
vertebral compression. Patient's known mild limitus lesions
throughout the lumbar spine on prior imaging are not well seen by
radiograph. Disc space narrowing and endplate spurring most
prominent at L2-L3 and L4-L5. Multilevel facet hypertrophy.
IMPRESSION: 1. No acute fracture or subluxation of the lumbar spine.
2. Stable concavity of inferior endplate of L2. Multilevel
degenerative disc disease and facet hypertrophy.
3. Known myomatous lesions throughout the spine on prior imaging are
not well seen by radiograph.

## 2020-11-28 IMAGING — CR DG FEMUR 2+V*L*
4 series · 4 of 4 positions shown · non-contrast
Comparison: Femur radiograph [DATE]

CLINICAL DATA: Pain.  Fall 2 weeks ago with fibular fracture.

EXAM:
LEFT FEMUR 2 VIEWS

[t femur proximal ap left]
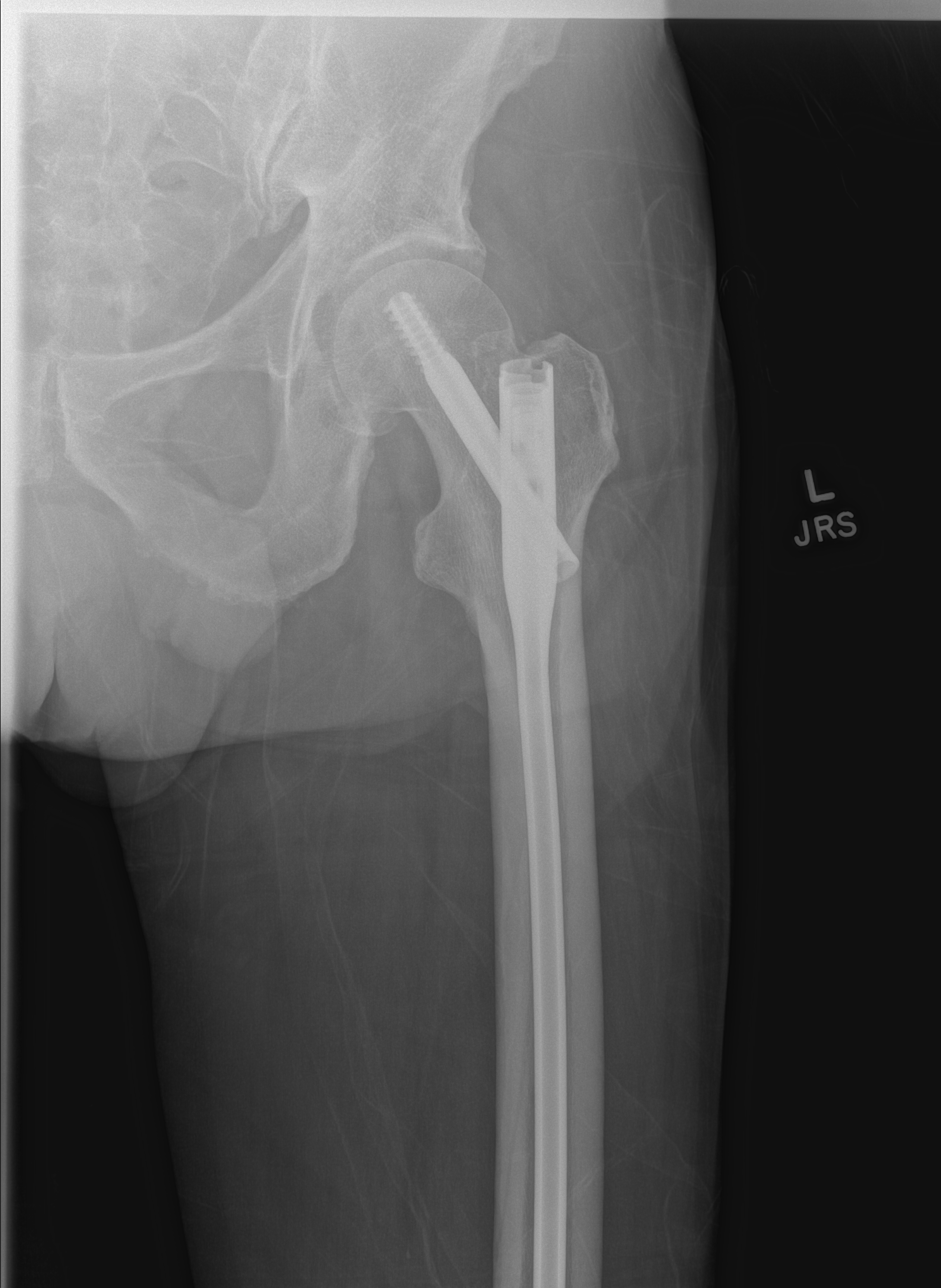

[t femur distal ap left]
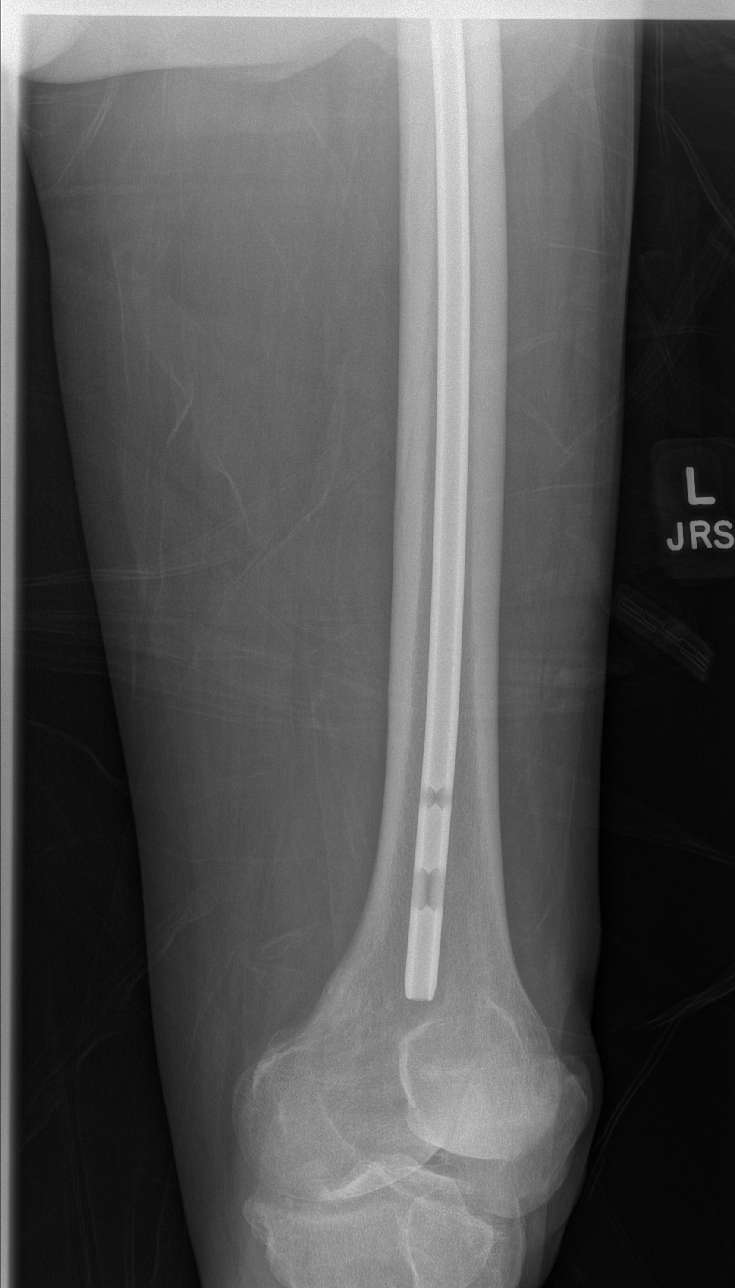

[t femur proximal lat left]
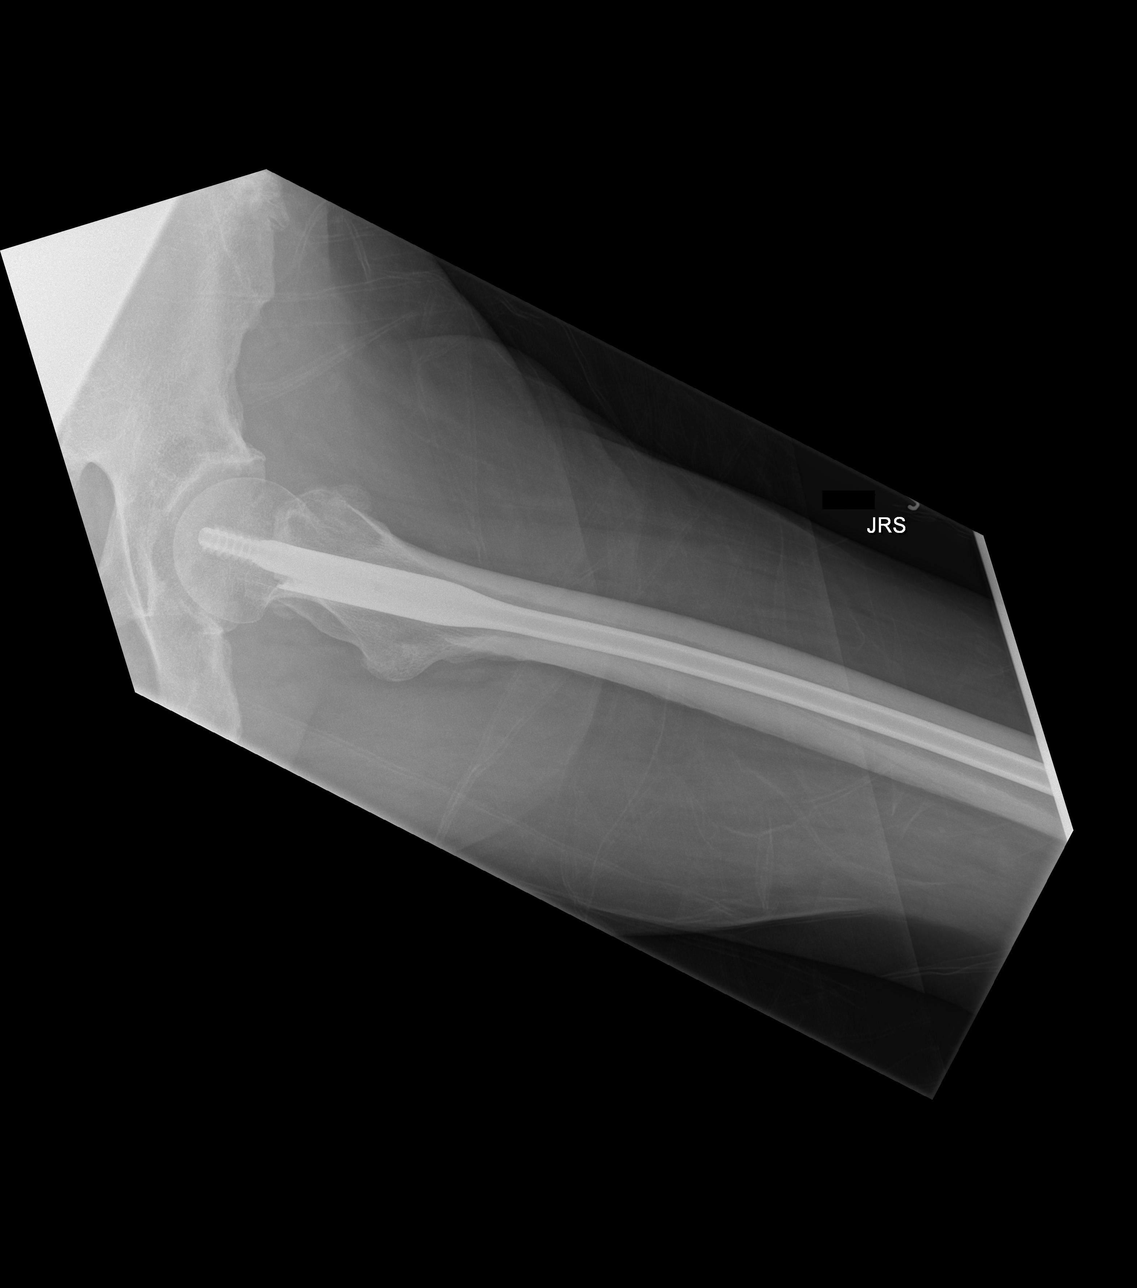

[x femur distal lat left]
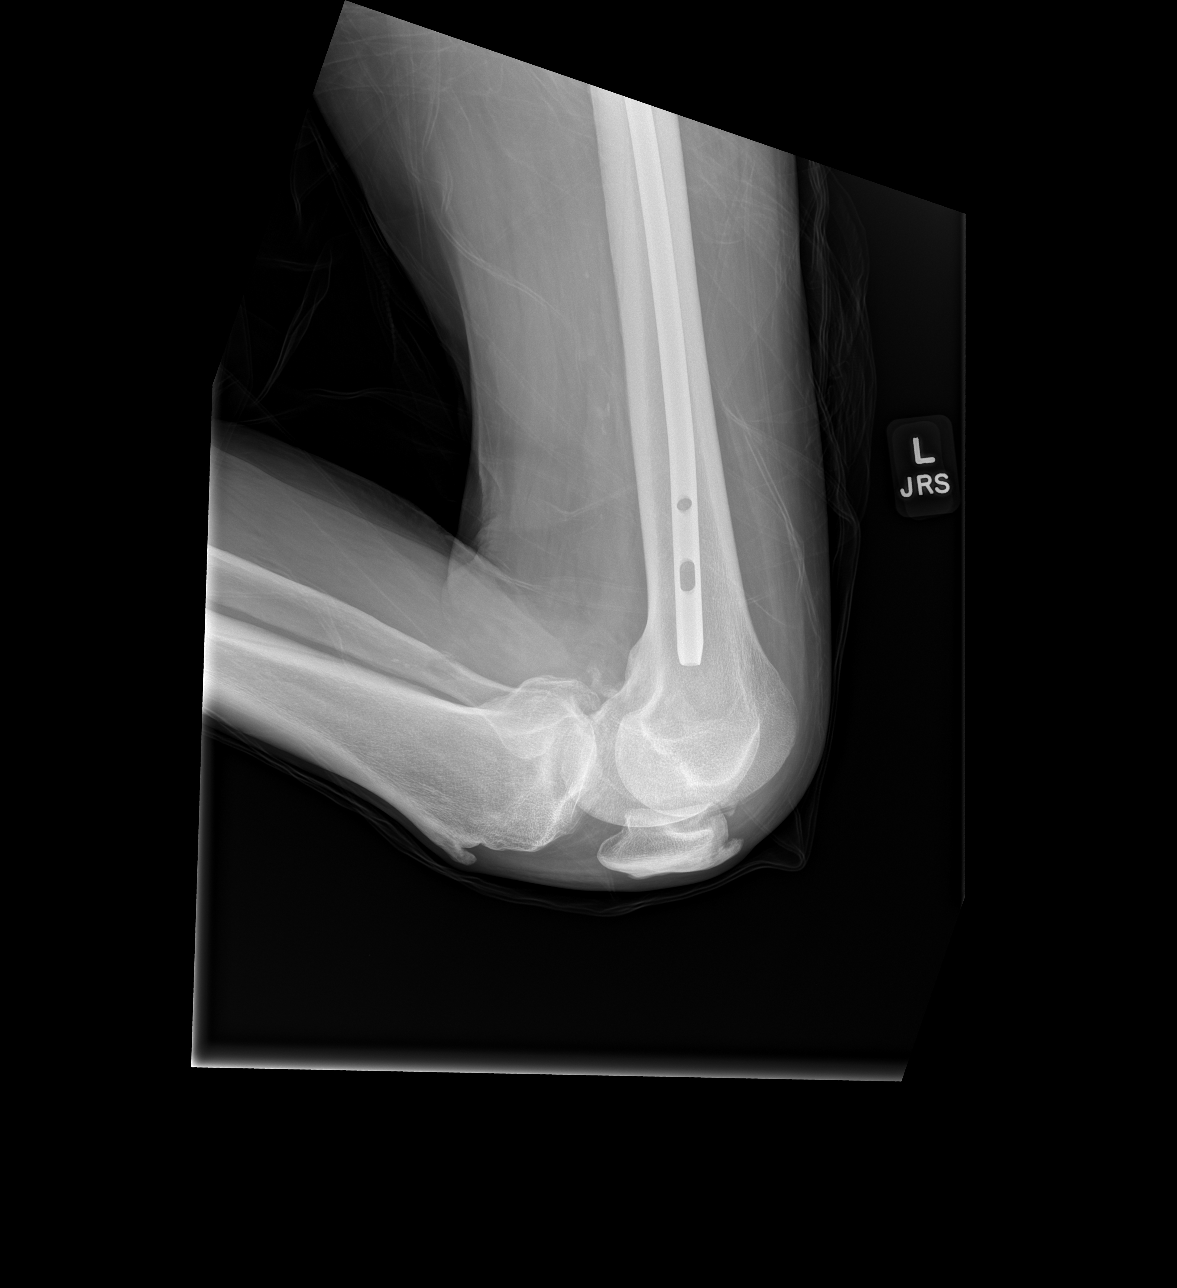

[4 of 4 positions shown; findings below may reference images not displayed]

FINDINGS: Intramedullary rod with trans trochanteric screw fixation of the
left femur. Known lucent lesion in the proximal femur involving the
femoral neck and greater trochanter, not well seen by radiograph. No
destructive or additional focal lesion is seen by radiograph. 2
linear lucencies involving the proximal medial femoral cortex are in
the seen on the lateral view, difficult to assess on comparison exam
due to differences in technique. Known proximal fibular lesion has
some peripheral callus about prior fracture.
IMPRESSION: 1. Two linear lucencies in the proximal medial femoral cortex
favored to represent nutrient channels. Possibility of nondisplaced
fracture is not entirely excluded, but felt less likely.
2. Stable intramedullary rod and trans trochanteric screw fixation.

## 2020-11-28 IMAGING — CR DG HIP (WITH OR WITHOUT PELVIS) 2-3V*L*
3 series · 3 of 3 positions shown · non-contrast
Comparison: Radiograph [DATE] pelvis MRI [DATE]

CLINICAL DATA: Pain. Fall 2 weeks ago with fibular fracture.
History of multiple myeloma.

EXAM:
DG HIP (WITH OR WITHOUT PELVIS) 2-3V LEFT

[t pelvis ap]
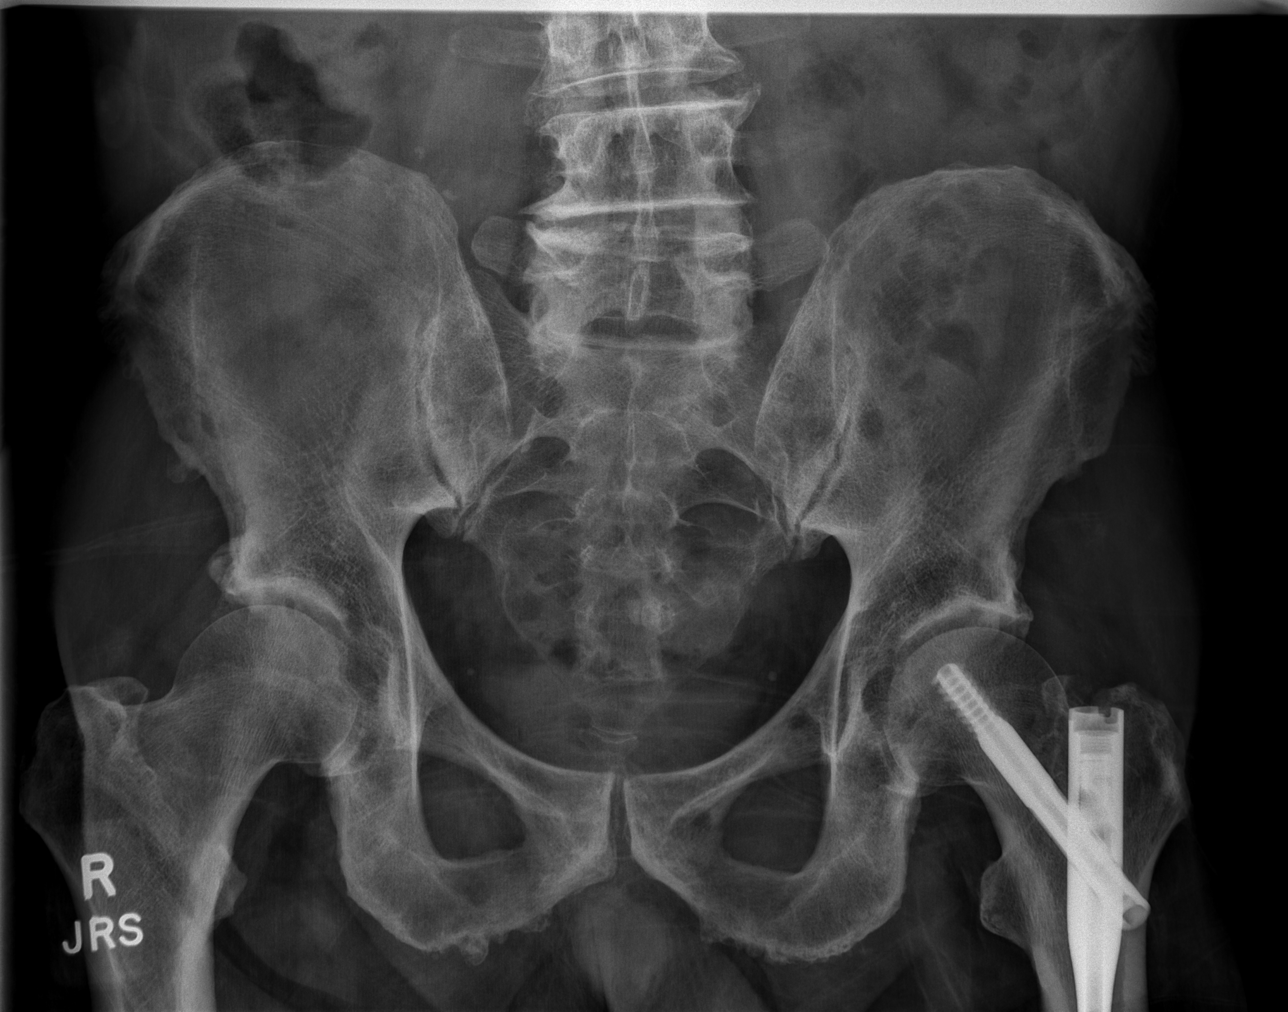

[t hip ap left]
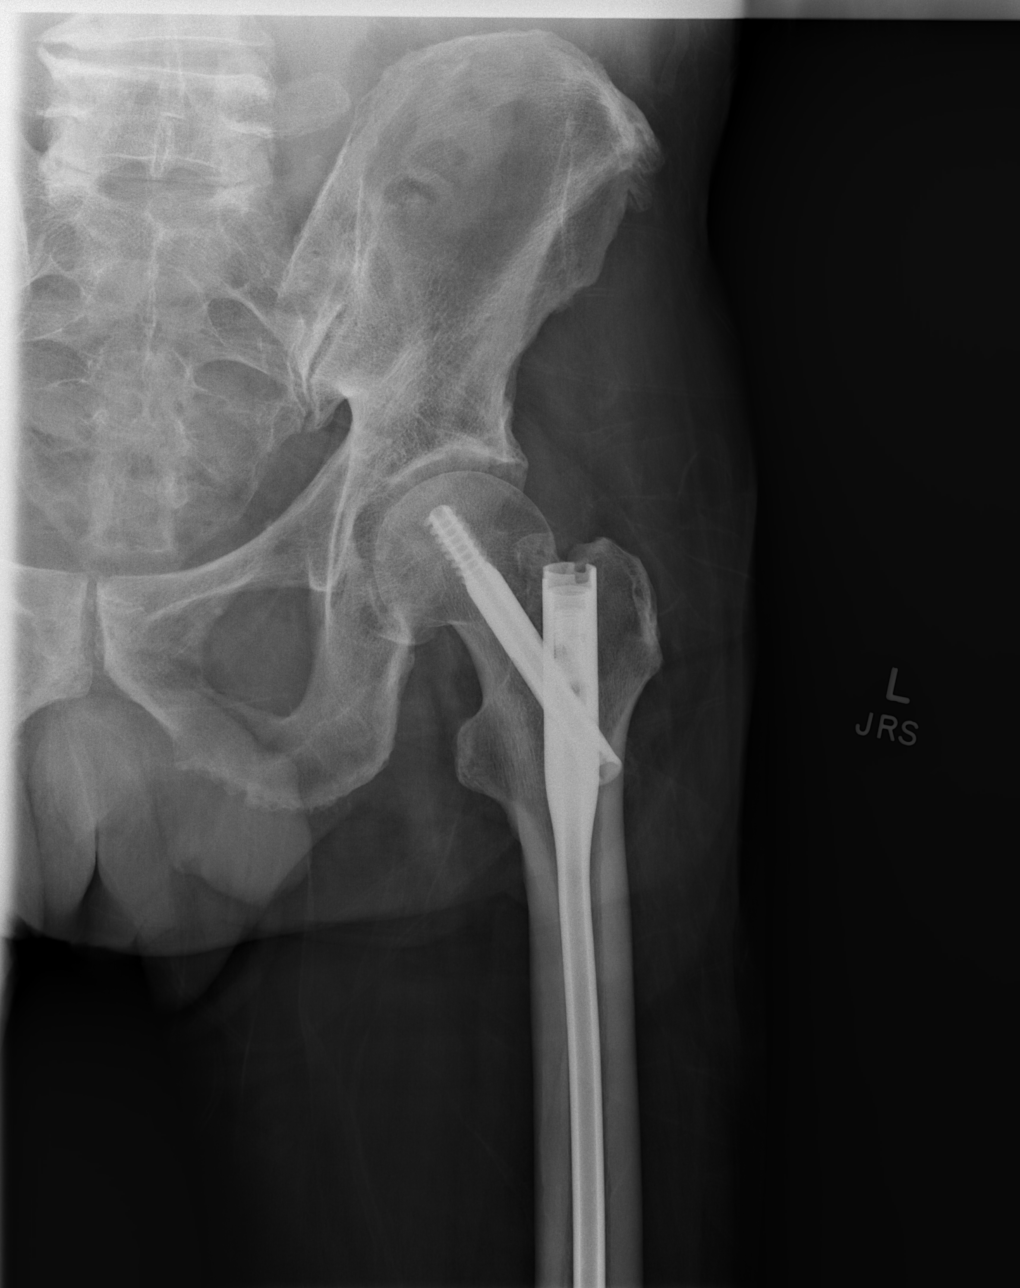

[t hip frog leg left]
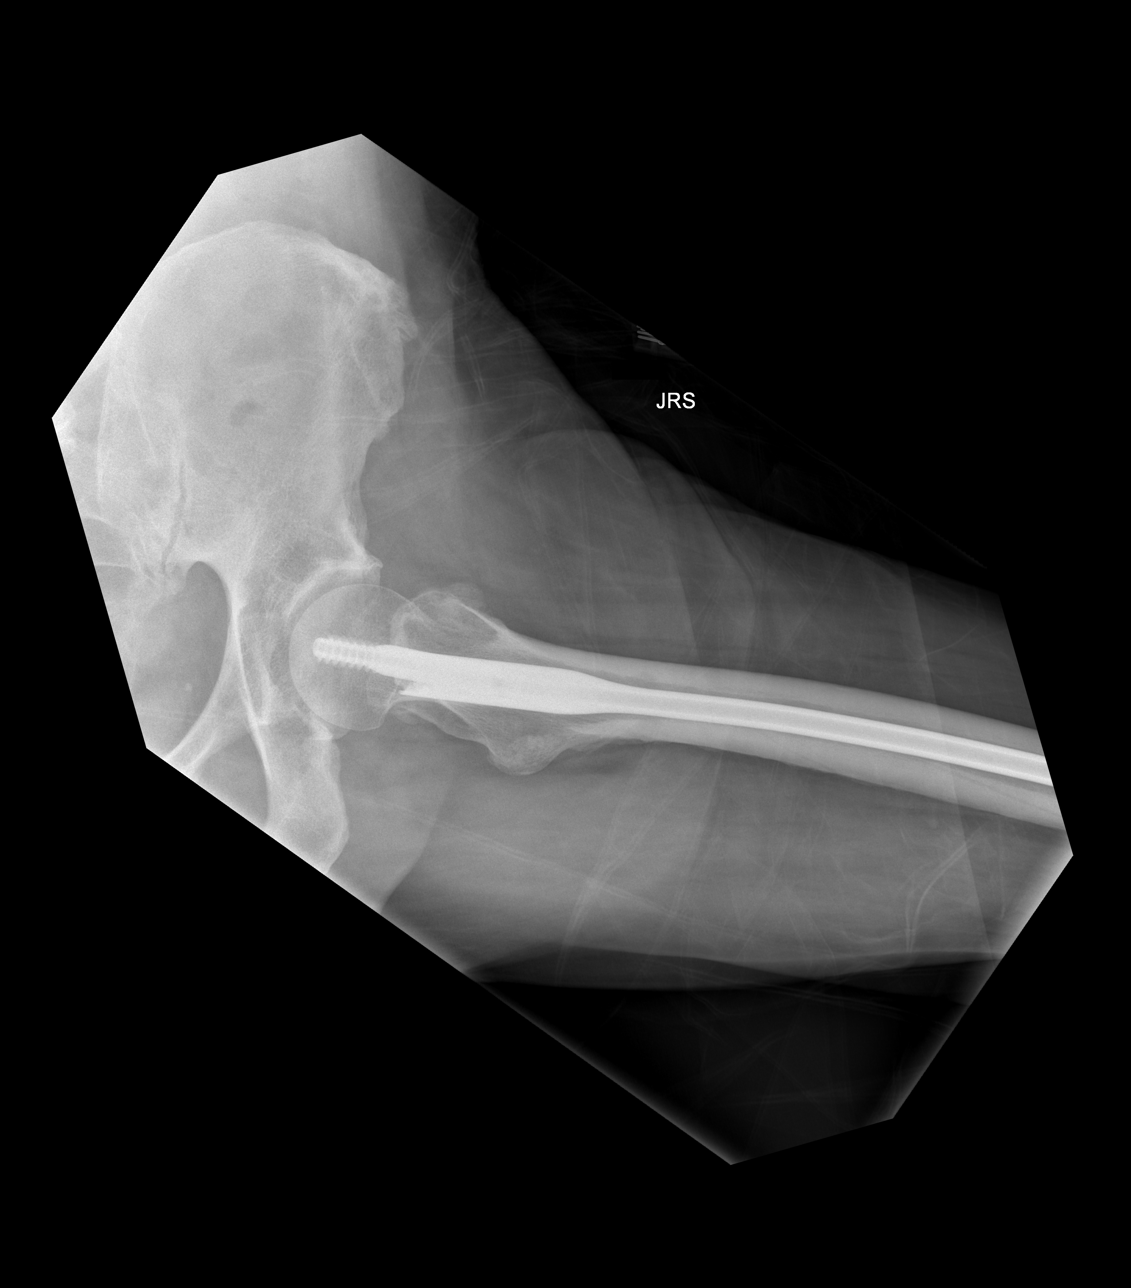

[3 of 3 positions shown; findings below may reference images not displayed]

FINDINGS: Intramedullary rod with trans trochanteric screw fixation of the
left proximal femur, hardware is intact were visualized. Known
lucent lesion involving the left proximal femur is only faintly
visualized by radiograph, but grossly stable from prior imaging.
Majority of the known myelomatous lesions throughout the pelvis not
well seen by radiograph. There is no evidence of acute fracture. The
sacroiliac joints and pubic symphysis are congruent. Bilateral hip
osteoarthritis is stable.
IMPRESSION: 1. No evidence of acute fracture of the pelvis or left hip.
2. Postsurgical change of the left proximal femur, intact hardware.
Known lucent lesion in the left proximal femur is only faintly
visualized by radiograph, grossly stable from prior imaging.
3. Majority of patient's known myelomatous lesions throughout the
pelvis on prior imaging are not well seen by radiograph.

## 2020-11-28 IMAGING — CR DG CHEST 2V
2 series · 2 of 2 positions shown · non-contrast
Comparison: Chest x-ray dated [DATE] and chest CT dated
[DATE]. PET CT dated [DATE].

CLINICAL DATA: Pt here for weakness and possible urinary retention
with constipation. History multiple myeloma, HTN, exsmoker

EXAM:
CHEST - 2 VIEW

[w chest lat]
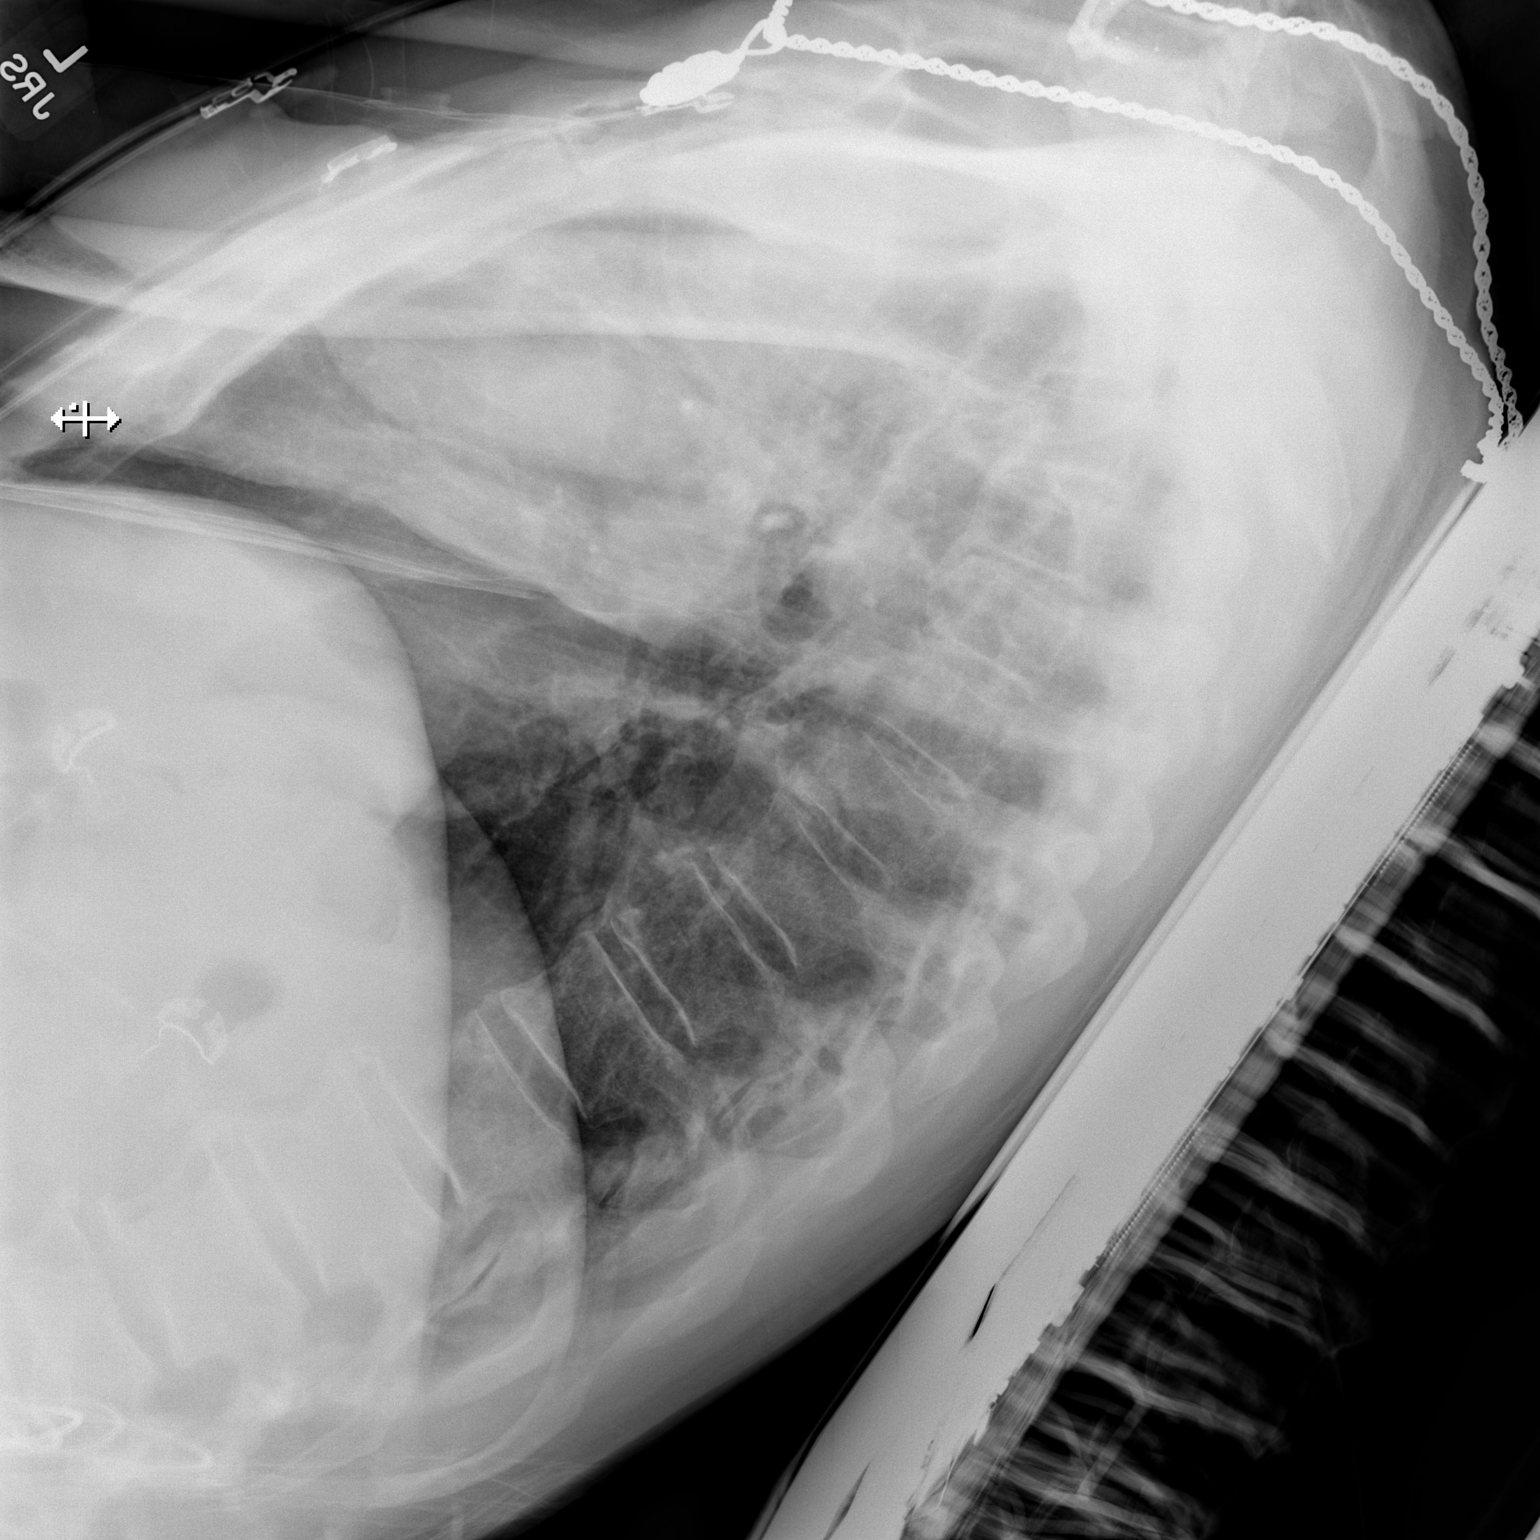

[x chest ap]
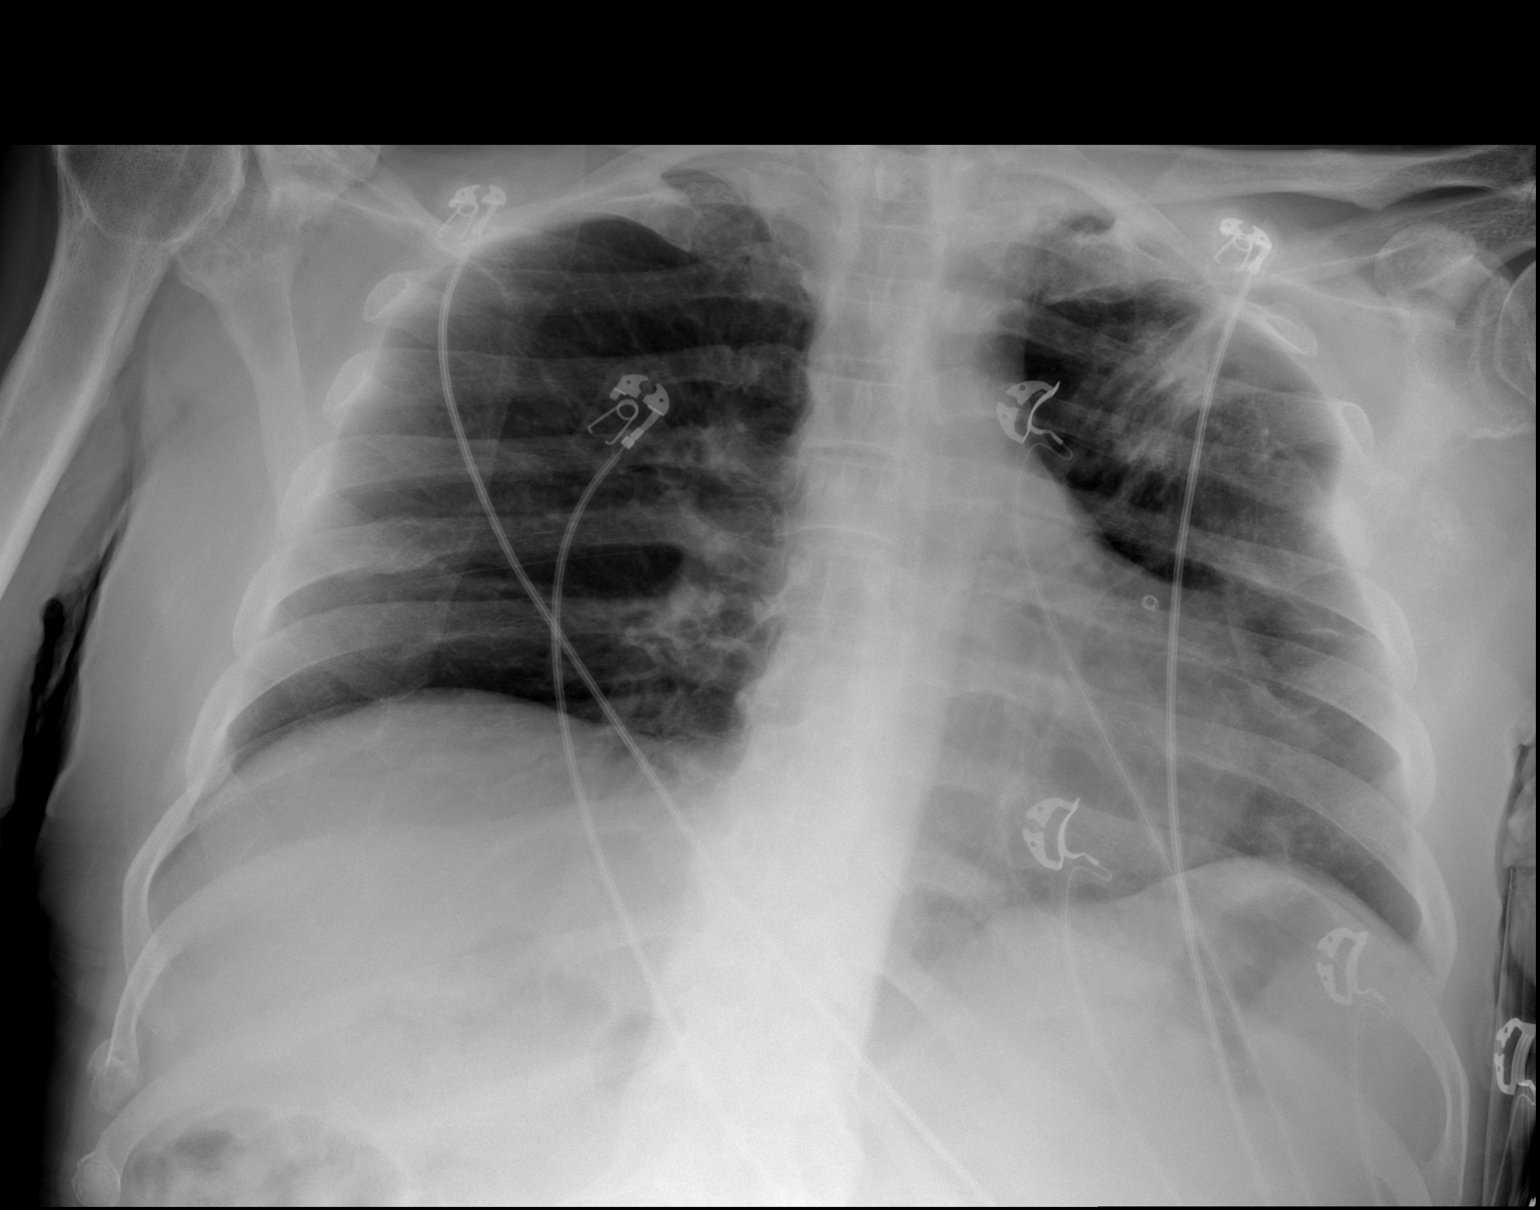

[2 of 2 positions shown; findings below may reference images not displayed]

FINDINGS: Ill-defined opacity within the LEFT upper lung, compatible with the
post radiation change better demonstrated on PET-CT of [DATE].
LEFT lower lung is clear. RIGHT lung remains clear. No pleural
effusion or pneumothorax is seen. No acute appearing osseous
abnormality.
IMPRESSION: 1. Ill-defined opacity within the LEFT upper lung, compatible with
the post radiation change better demonstrated on PET-CT of
[DATE].
2. No acute findings.  No evidence of pneumonia or pulmonary edema.

## 2020-11-28 MED ORDER — HYDROMORPHONE HCL 1 MG/ML IJ SOLN
1.0000 mg | Freq: Once | INTRAMUSCULAR | Status: AC
  Administered 2020-11-28: 1 mg via INTRAVENOUS
  Filled 2020-11-28: qty 1

## 2020-11-28 MED ORDER — ONDANSETRON HCL 4 MG/2ML IJ SOLN
4.0000 mg | Freq: Once | INTRAMUSCULAR | Status: AC
  Administered 2020-11-28: 4 mg via INTRAVENOUS
  Filled 2020-11-28: qty 2

## 2020-11-28 MED ORDER — SODIUM CHLORIDE 0.9 % IV BOLUS
1000.0000 mL | Freq: Once | INTRAVENOUS | Status: AC
  Administered 2020-11-28: 1000 mL via INTRAVENOUS

## 2020-11-28 NOTE — ED Triage Notes (Signed)
Pt arrived via EMS, from home, called due to concern of bowel obstructions and urinary retention. Pt had not gone in over 24 hrs. Prior to EMS arrival, pt able to urinate and move bowels right before picked up. Pt c/o dehydration in triage. Denies any vomiting or pain at this time.

## 2020-11-28 NOTE — ED Provider Notes (Signed)
[facility] DEPT Provider Note   CSN: [id number] Arrival date & time: 11/28/20  1342     History Chief Complaint  Patient presents with  . Dehydration    Bruce Smith is a 70 y.o. male.  Pt presents to the ED today not feeling well.  Pt said he just feels weak.  He had not urinated or had a bowel movement in 24 hrs, but had one just prior to EMS arrival.  He denies any new pain.  He does have pain in his right knee from a pathologic fracture.  He also c/o pain to his left hip and femur.  He is on a duragesic patch, ultram, and dilaudid for his pain.  No f/c.  No sob.  He has had little appetite and has not been drinking much.  Pt has a hx of multiple myeloma and is getting evaluated for a stem cell transplant.        Past Medical History:  Diagnosis Date  . Atherosclerosis   . Coronary artery disease   . Goals of care, counseling/discussion 06/29/2020  . 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 Hyperlipidemia   . Hypertension   . 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 Multiple myeloma ([place]) 07/21/2020  . Myocardial infarction [facility])     Patient Active Problem List   Diagnosis Date Noted  . AKI (acute kidney injury) ([place]) 10/29/2020  . Abnormal neurological exam 10/29/2020  . Essential hypertension 10/29/2020  . CAD (coronary artery disease) 10/29/2020  . Intractable pain 10/26/2020  . 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 Goals of care, counseling/discussion 06/29/2020  . 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 ANTERIOR CERVICAL DECOMP/DISCECTOMY FUSION N/A 06/17/2020   Procedure: ANTERIOR CERVICAL DECOMPRESSION/DISCECTOMY FUSION, INTERBODY PROSTHESIS, PLATE/SCREWS CERVICAL FIVE- CERVICAL SIX;  Surgeon: Newman Pies, MD;  Location: [facility];  Service: Neurosurgery;  Laterality: N/A;  ANTERIOR CERVICAL DECOMPRESSION/DISCECTOMY FUSION, INTERBODY PROSTHESIS, PLATE/SCREWS CERVICAL FIVE- CERVICAL SIX  . CARDIAC CATHETERIZATION    . FEMUR IM NAIL Left 07/14/2020   Procedure: OPEN REDUCTION INTERNAL FIXATION (ORIF LEFT FEMORAL NAIL FRACTURE;  Surgeon: Marchia Bond, MD;  Location: [place];  Service: Orthopedics;  Laterality: Left;       History reviewed. No pertinent family history.  Social History   Tobacco Use  . Smoking status: Former Smoker    Quit date: 2000    Years since quitting: 22.0  . Smokeless tobacco: Current User    Types: Chew  Vaping Use  . Vaping Use: Never used  Substance Use Topics  . Alcohol use: Not Currently    Comment: rare  . Drug use: Never    Home Medications Prior to Admission medications   Medication Sig Start Date End Date Taking? Authorizing Provider  acetaminophen (TYLENOL) 500 MG tablet Take 1,000 mg by mouth daily.    [provider]  aspirin 81 MG EC tablet Take 81 mg by mouth daily. Swallow whole.    [provider]  atorvastatin (LIPITOR) 40 MG tablet Take 20 mg by mouth daily. 09/09/20   [provider]  Coenzyme Q10 (CO Q-10) 200 MG CAPS Take 200 mg by mouth daily.    [provider]  dexamethasone (DECADRON) 4 MG tablet Take 4 mg by mouth daily. Take one tablet for 3  days after chemotherapy.    [provider]  dronabinol (MARINOL) 5 MG capsule Take 5 mg by mouth daily as needed (nausea). 08/11/20   [provider]  famciclovir (FAMVIR) 500 MG tablet Take 1 tablet (500 mg total) by mouth daily. 08/11/20   Volanda Napoleon, MD  fentaNYL (DURAGESIC) 25 MCG/HR Place 1 patch onto the skin every 3 (three) days. 11/10/20   Volanda Napoleon, MD  HYDROmorphone (DILAUDID) 2 MG tablet Take 0.5 tablets (1 mg total) by mouth every 12 (twelve) hours as needed for severe pain. 11/10/20 12/10/20  Volanda Napoleon, MD   LORazepam (ATIVAN) 0.5 MG tablet Take 1 tablet (0.5 mg total) by mouth every 6 (six) hours as needed for anxiety (nausea). Patient not taking: No sig reported 07/21/20   Volanda Napoleon, MD  megestrol (MEGACE ES) 625 MG/5ML suspension Take 5 mLs (625 mg total) by mouth daily. 10/28/20   Volanda Napoleon, MD  metoCLOPramide (REGLAN) 10 MG tablet Take 1 tablet (10 mg total) by mouth 4 (four) times daily -  before meals and at bedtime. 11/03/20   Volanda Napoleon, MD  metoprolol succinate (TOPROL-XL) 50 MG 24 hr tablet Take 50 mg by mouth daily. 11/11/20   [provider]  naloxone [facility]) nasal spray 4 mg/0.1 mL Place 1 spray into the nose once. 10/14/20   [provider]  nitroGLYCERIN (NITROSTAT) 0.4 MG SL tablet Place 0.4 mg under the tongue every 5 (five) minutes as needed for chest pain.    [provider]  omeprazole (PRILOSEC) 40 MG capsule Take 40 mg by mouth daily. 08/06/20 08/06/21  [provider]  polyethylene glycol (MIRALAX) 17 g packet Take 17 g by mouth daily. 11/01/20   Nita Sells, MD  pregabalin (LYRICA) 100 MG capsule Take 1 capsule (100 mg total) by mouth daily. 11/01/20   Nita Sells, MD  sennosides-docusate sodium (SENOKOT-S) 8.6-50 MG tablet Take 2 tablets by mouth daily. Patient taking differently: Take 2 tablets by mouth daily as needed for constipation. 07/14/20   Ventura Bruns, PA-C  silver sulfADIAZINE (SILVADENE) 1 % cream Apply 1 application topically 2 (two) times daily. Patient taking differently: Apply 1 application topically 2 (two) times daily as needed (bed sores). 10/06/20   Volanda Napoleon, MD  traMADol (ULTRAM) 50 MG tablet Take 1 tablet (50 mg total) by mouth 3 (three) times daily as needed for moderate pain. 11/02/20   Volanda Napoleon, MD    Allergies    Patient has no known allergies.  Review of Systems   Review of Systems  Neurological: Positive for weakness.  All other systems reviewed and are  negative.   Physical Exam Updated Vital Signs BP (!) 148/88   Pulse 72   Temp 97.9 F (36.6 C) (Oral)   Resp 12   SpO2 98%   Physical Exam Vitals and nursing note reviewed.  HENT:     Head: Normocephalic and atraumatic.     Right Ear: External ear normal.     Left Ear: External ear normal.     Nose: Nose normal.     Mouth/Throat:     Mouth: Mucous membranes are dry.     Pharynx: Oropharynx is clear.  Eyes:     Extraocular Movements: Extraocular movements intact.     Pupils: Pupils are equal, round, and reactive to light.  Cardiovascular:     Rate and Rhythm: Normal rate and regular rhythm.     Pulses: Normal pulses.  Heart sounds: Normal heart sounds.  Pulmonary:     Effort: Pulmonary effort is normal.     Breath sounds: Normal breath sounds.  Abdominal:     General: Abdomen is flat. Bowel sounds are normal.     Palpations: Abdomen is soft.  Musculoskeletal:        General: Normal range of motion.     Cervical back: Normal range of motion and neck supple.  Skin:    General: Skin is warm.     Capillary Refill: Capillary refill takes less than 2 seconds.  Neurological:     General: No focal deficit present.     Mental Status: He is alert and oriented to person, place, and time.  Psychiatric:        Mood and Affect: Mood normal.        Behavior: Behavior normal.        Thought Content: Thought content normal.        Judgment: Judgment normal.     ED Results / Procedures / Treatments   Labs (all labs ordered are listed, but only abnormal results are displayed) Labs Reviewed  COMPREHENSIVE METABOLIC PANEL - Abnormal; Notable for the following components:      Result Value   Sodium 125 (*)    Chloride 97 (*)    CO2 17 (*)    Glucose, Bld 128 (*)    Calcium 8.2 (*)    Total Protein 5.6 (*)    Albumin 3.2 (*)    All other components within normal limits  CBC WITH DIFFERENTIAL/PLATELET - Abnormal; Notable for the following components:   RBC 4.07 (*)     Hemoglobin 12.6 (*)    HCT 37.3 (*)    Lymphs Abs 0.3 (*)    All other components within normal limits  URINALYSIS, ROUTINE W REFLEX MICROSCOPIC - Abnormal; Notable for the following components:   APPearance HAZY (*)    All other components within normal limits  SARS CORONAVIRUS 2 BY RT PCR [facility] ORDER, [facility])    EKG EKG Interpretation  Date/Time:  Saturday November 28 2020 13:55:12 EST Ventricular Rate:  74 PR Interval:    QRS Duration: 88 QT Interval:  380 QTC Calculation: 422 R Axis:   -33 Text Interpretation: Sinus rhythm Left axis deviation Anterior infarct, old No significant change since last tracing Confirmed by Isla Pence [phone]) on 11/28/2020 2:34:48 PM   Radiology DG Chest 2 View  Result Date: 11/28/2020 CLINICAL DATA:  Pt here for weakness and possible urinary retention with constipation. History multiple myeloma, HTN, exsmoker EXAM: CHEST - 2 VIEW COMPARISON:  Chest x-ray dated 06/09/2020 and chest CT dated 06/26/2020. PET CT dated 10/16/2020. FINDINGS: Ill-defined opacity within the LEFT upper lung, compatible with the post radiation change better demonstrated on PET-CT of 10/16/2020. LEFT lower lung is clear. RIGHT lung remains clear. No pleural effusion or pneumothorax is seen. No acute appearing osseous abnormality. IMPRESSION: 1. Ill-defined opacity within the LEFT upper lung, compatible with the post radiation change better demonstrated on PET-CT of 10/16/2020. 2. No acute findings.  No evidence of pneumonia or pulmonary edema. Electronically Signed   By: Franki Cabot M.D.   On: 11/28/2020 14:58   DG Lumbar Spine Complete  Result Date: 11/28/2020 CLINICAL DATA:  Pain. Fall 2 weeks ago with fibular fracture. History of myeloma. EXAM: LUMBAR SPINE - COMPLETE 4+ VIEW COMPARISON:  Lumbar MRI 10/26/2020 FINDINGS: Straightening of normal lordosis, unchanged. No listhesis. Stable concavity of  inferior endplate of L2. No acute fracture  or acute vertebral compression. Patient's known mild limitus lesions throughout the lumbar spine on prior imaging are not well seen by radiograph. Disc space narrowing and endplate spurring most prominent at L2-L3 and L4-L5. Multilevel facet hypertrophy. IMPRESSION: 1. No acute fracture or subluxation of the lumbar spine. 2. Stable concavity of inferior endplate of L2. Multilevel degenerative disc disease and facet hypertrophy. 3. Known myomatous lesions throughout the spine on prior imaging are not well seen by radiograph. Electronically Signed   By: Keith Rake M.D.   On: 11/28/2020 17:30   DG Hip Unilat W or Wo Pelvis 2-3 Views Left  Result Date: 11/28/2020 CLINICAL DATA:  Pain. Fall 2 weeks ago with fibular fracture. History of multiple myeloma. EXAM: DG HIP (WITH OR WITHOUT PELVIS) 2-3V LEFT COMPARISON:  Radiograph 10/26/2020 pelvis MRI 10/26/2020 FINDINGS: Intramedullary rod with trans trochanteric screw fixation of the left proximal femur, hardware is intact were visualized. Known lucent lesion involving the left proximal femur is only faintly visualized by radiograph, but grossly stable from prior imaging. Majority of the known myelomatous lesions throughout the pelvis not well seen by radiograph. There is no evidence of acute fracture. The sacroiliac joints and pubic symphysis are congruent. Bilateral hip osteoarthritis is stable. IMPRESSION: 1. No evidence of acute fracture of the pelvis or left hip. 2. Postsurgical change of the left proximal femur, intact hardware. Known lucent lesion in the left proximal femur is only faintly visualized by radiograph, grossly stable from prior imaging. 3. Majority of patient's known myelomatous lesions throughout the pelvis on prior imaging are not well seen by radiograph. Electronically Signed   By: Keith Rake M.D.   On: 11/28/2020 17:28   DG FEMUR MIN 2 VIEWS LEFT  Result Date: 11/28/2020 CLINICAL DATA:  Pain.  Fall 2 weeks ago with fibular  fracture. EXAM: LEFT FEMUR 2 VIEWS COMPARISON:  Femur radiograph 07/14/2020 FINDINGS: Intramedullary rod with trans trochanteric screw fixation of the left femur. Known lucent lesion in the proximal femur involving the femoral neck and greater trochanter, not well seen by radiograph. No destructive or additional focal lesion is seen by radiograph. 2 linear lucencies involving the proximal medial femoral cortex are in the seen on the lateral view, difficult to assess on comparison exam due to differences in technique. Known proximal fibular lesion has some peripheral callus about prior fracture. IMPRESSION: 1. Two linear lucencies in the proximal medial femoral cortex favored to represent nutrient channels. Possibility of nondisplaced fracture is not entirely excluded, but felt less likely. 2. Stable intramedullary rod and trans trochanteric screw fixation. Electronically Signed   By: Keith Rake M.D.   On: 11/28/2020 17:34    Procedures Procedures   Medications Ordered in ED Medications  sodium chloride 0.9 % bolus 1,000 mL (0 mLs Intravenous Stopped 11/28/20 1648)  HYDROmorphone (DILAUDID) injection 1 mg (1 mg Intravenous Given 11/28/20 1649)  ondansetron (ZOFRAN) injection 4 mg (4 mg Intravenous Given 11/28/20 1649)    ED Course  I have reviewed the triage vital signs and the nursing notes.  Pertinent labs & imaging results that were available during my care of the patient were reviewed by me and considered in my medical decision making (see chart for details).    MDM Rules/Calculators/A&P                          Pt does have some urinary retention.  650 cc urine in bladder.  Foley placed.  Na is low which is new.  He is not on any diuretics.  He has not been on hyzaar since his d/c.  Calcium is not elevated.  Covid is negative.    Pt does not have any evidence of any new lytic lesions.  I reviewed his MRI lumbar and pelvis from his admission at the end of December.  Pt is stable for  d/c.  He knows to return if worse.  F/u with pcp/urology/ennever)   Final Clinical Impression(s) / ED Diagnoses Final diagnoses:  Hyponatremia  Dehydration  Multiple myeloma, remission status unspecified ([place])  Urinary retention    Rx / DC Orders ED Discharge Orders    None       Isla Pence, MD 11/28/20 1755

## 2020-11-28 NOTE — ED Notes (Signed)
Pt discharged from this ED in stable condition at this time. All discharge instructions and follow up care reviewed with pt with no further questions at this time. Pt ambulatory with steady gait, clear speech.  

## 2020-11-28 NOTE — Discharge Instructions (Addendum)
You will need to get your sodium rechecked in about 1 week.

## 2020-11-30 ENCOUNTER — Telehealth: Payer: Self-pay | Admitting: *Deleted

## 2020-11-30 NOTE — Telephone Encounter (Signed)
Message received from patient's wife to inform Dr. [name] that pt went to the ER on Saturday, 11/28/20 d/t he was unable to void. Call placed back to patient's wife, Maudie Mercury and she states that foley catheter was placed in the ER and would like to know if patient should keep his appt with [facility] this Wednesday.  Informed Kim to contact [facility] about his appt there this Wednesday.  Dr. [name] notified.

## 2020-12-02 ENCOUNTER — Telehealth: Payer: Self-pay | Admitting: *Deleted

## 2020-12-02 ENCOUNTER — Other Ambulatory Visit: Payer: Self-pay | Admitting: *Deleted

## 2020-12-02 DIAGNOSIS — C9000 Multiple myeloma not having achieved remission: Secondary | ICD-10-CM

## 2020-12-02 DIAGNOSIS — N401 Enlarged prostate with lower urinary tract symptoms: Secondary | ICD-10-CM | POA: Insufficient documentation

## 2020-12-02 DIAGNOSIS — C419 Malignant neoplasm of bone and articular cartilage, unspecified: Secondary | ICD-10-CM

## 2020-12-02 NOTE — Telephone Encounter (Signed)
Call received from [place] PA at [facility] to inform Dr. [name] that pt has a bed sore to his mid upper buttock that is quarter size, grade two, is suggesting a wound care consult, pt.'s NA is 126 and that she recommends for pt to come in tomorrow or Friday for repeat CMET.  Dr. [name] notified and order received for wound consult and also for pt to come in tomorrow, 12/03/20 for labs and possible IVF's.  Message sent to scheduling.

## 2020-12-03 ENCOUNTER — Inpatient Hospital Stay (HOSPITAL_BASED_OUTPATIENT_CLINIC_OR_DEPARTMENT_OTHER): Payer: BC Managed Care – PPO | Admitting: Hematology & Oncology

## 2020-12-03 ENCOUNTER — Encounter: Payer: Self-pay | Admitting: Hematology & Oncology

## 2020-12-03 ENCOUNTER — Inpatient Hospital Stay: Payer: BC Managed Care – PPO

## 2020-12-03 ENCOUNTER — Other Ambulatory Visit: Payer: Self-pay

## 2020-12-03 ENCOUNTER — Telehealth: Payer: Self-pay | Admitting: Hematology & Oncology

## 2020-12-03 ENCOUNTER — Inpatient Hospital Stay: Payer: BC Managed Care – PPO | Attending: Hematology & Oncology

## 2020-12-03 VITALS — Wt 162.0 lb

## 2020-12-03 VITALS — BP 127/74 | HR 105 | Temp 98.5°F | Resp 17

## 2020-12-03 DIAGNOSIS — C9000 Multiple myeloma not having achieved remission: Secondary | ICD-10-CM | POA: Insufficient documentation

## 2020-12-03 DIAGNOSIS — R42 Dizziness and giddiness: Secondary | ICD-10-CM | POA: Insufficient documentation

## 2020-12-03 DIAGNOSIS — R634 Abnormal weight loss: Secondary | ICD-10-CM | POA: Diagnosis not present

## 2020-12-03 DIAGNOSIS — R110 Nausea: Secondary | ICD-10-CM | POA: Diagnosis not present

## 2020-12-03 DIAGNOSIS — Z79899 Other long term (current) drug therapy: Secondary | ICD-10-CM | POA: Insufficient documentation

## 2020-12-03 DIAGNOSIS — C419 Malignant neoplasm of bone and articular cartilage, unspecified: Secondary | ICD-10-CM

## 2020-12-03 DIAGNOSIS — M25552 Pain in left hip: Secondary | ICD-10-CM | POA: Diagnosis not present

## 2020-12-03 LAB — CBC WITH DIFFERENTIAL (CANCER CENTER ONLY)
Abs Immature Granulocytes: 0.08 10*3/uL — ABNORMAL HIGH (ref 0.00–0.07)
Basophils Absolute: 0 10*3/uL (ref 0.0–0.1)
Basophils Relative: 0 %
Eosinophils Absolute: 0 10*3/uL (ref 0.0–0.5)
Eosinophils Relative: 0 %
HCT: 35.2 % — ABNORMAL LOW (ref 39.0–52.0)
Hemoglobin: 12.5 g/dL — ABNORMAL LOW (ref 13.0–17.0)
Immature Granulocytes: 1 %
Lymphocytes Relative: 4 %
Lymphs Abs: 0.3 10*3/uL — ABNORMAL LOW (ref 0.7–4.0)
MCH: 30.9 pg (ref 26.0–34.0)
MCHC: 35.5 g/dL (ref 30.0–36.0)
MCV: 87.1 fL (ref 80.0–100.0)
Monocytes Absolute: 1 10*3/uL (ref 0.1–1.0)
Monocytes Relative: 14 %
Neutro Abs: 5.6 10*3/uL (ref 1.7–7.7)
Neutrophils Relative %: 81 %
Platelet Count: 356 10*3/uL (ref 150–400)
RBC: 4.04 MIL/uL — ABNORMAL LOW (ref 4.22–5.81)
RDW: 15.1 % (ref 11.5–15.5)
WBC Count: 7 10*3/uL (ref 4.0–10.5)
nRBC: 0 % (ref 0.0–0.2)

## 2020-12-03 LAB — CMP (CANCER CENTER ONLY)
ALT: 29 U/L (ref 0–44)
AST: 15 U/L (ref 15–41)
Albumin: 3.8 g/dL (ref 3.5–5.0)
Alkaline Phosphatase: 88 U/L (ref 38–126)
Anion gap: 8 (ref 5–15)
BUN: 19 mg/dL (ref 8–23)
CO2: 21 mmol/L — ABNORMAL LOW (ref 22–32)
Calcium: 8.8 mg/dL — ABNORMAL LOW (ref 8.9–10.3)
Chloride: 96 mmol/L — ABNORMAL LOW (ref 98–111)
Creatinine: 0.71 mg/dL (ref 0.61–1.24)
GFR, Estimated: >60 mL/min (ref >60)
Glucose, Bld: 123 mg/dL — ABNORMAL HIGH (ref 70–99)
Potassium: 4.1 mmol/L (ref 3.5–5.1)
Sodium: 125 mmol/L — ABNORMAL LOW (ref 135–145)
Total Bilirubin: 0.5 mg/dL (ref 0.3–1.2)
Total Protein: 5.6 g/dL — ABNORMAL LOW (ref 6.5–8.1)

## 2020-12-03 MED ORDER — SODIUM CHLORIDE 0.9 % IV SOLN
INTRAVENOUS | Status: AC
  Filled 2020-12-03: qty 250

## 2020-12-03 MED ORDER — DIPHENHYDRAMINE HCL 25 MG PO CAPS
ORAL_CAPSULE | ORAL | Status: AC
  Filled 2020-12-03: qty 2

## 2020-12-03 MED ORDER — HYDROMORPHONE HCL 1 MG/ML IJ SOLN
2.0000 mg | Freq: Once | INTRAMUSCULAR | Status: AC
  Administered 2020-12-03: 2 mg via INTRAVENOUS

## 2020-12-03 MED ORDER — SODIUM CHLORIDE 0.9 % IV SOLN
Freq: Once | INTRAVENOUS | Status: AC
  Filled 2020-12-03: qty 250

## 2020-12-03 MED ORDER — HYDROMORPHONE HCL 1 MG/ML IJ SOLN
INTRAMUSCULAR | Status: AC
  Filled 2020-12-03: qty 2

## 2020-12-03 MED ORDER — OLANZAPINE 10 MG PO TABS: 10.0000 mg | ORAL_TABLET | Freq: Every day | ORAL | 2 refills | Status: DC

## 2020-12-03 MED ORDER — SODIUM CHLORIDE 0.9 % IV SOLN
16.0000 mg | Freq: Once | INTRAVENOUS | Status: AC
  Administered 2020-12-03: 16 mg via INTRAVENOUS
  Filled 2020-12-03: qty 8

## 2020-12-03 MED ORDER — ACETAMINOPHEN 325 MG PO TABS
ORAL_TABLET | ORAL | Status: AC
  Filled 2020-12-03: qty 2

## 2020-12-03 MED ORDER — SODIUM CHLORIDE 0.9 % IV SOLN
20.0000 mg | Freq: Once | INTRAVENOUS | Status: AC
  Administered 2020-12-03: 20 mg via INTRAVENOUS
  Filled 2020-12-03: qty 2

## 2020-12-03 MED ORDER — DEXAMETHASONE 4 MG PO TABS
ORAL_TABLET | ORAL | Status: AC
  Filled 2020-12-03: qty 5

## 2020-12-03 NOTE — Telephone Encounter (Signed)
I called and advised patient that the [facility] in [place] did not have any appointments available until 01/05/2021.  I have also called [phone] and they will call him to schedule.  Patient voiced understanding of this as well

## 2020-12-03 NOTE — Patient Instructions (Signed)
Dehydration, Adult Dehydration is a condition in which there is not enough water or other fluids in the body. This happens when a person loses more fluids than he or she takes in. Important organs, such as the kidneys, brain, and heart, cannot function without a proper amount of fluids. Any loss of fluids from the body can lead to dehydration. Dehydration can be mild, moderate, or severe. It should be treated right away to prevent it from becoming severe. What are the causes? Dehydration may be caused by:  Conditions that cause loss of water or other fluids, such as diarrhea, vomiting, or sweating or urinating a lot.  Not drinking enough fluids, especially when you are ill or doing activities that require a lot of energy.  Other illnesses and conditions, such as fever or infection.  Certain medicines, such as medicines that remove excess fluid from the body (diuretics).  Lack of safe drinking water.  Not being able to get enough water and food. What increases the risk? The following factors may make you more likely to develop this condition:  Having a long-term (chronic) illness that has not been treated properly, such as diabetes, heart disease, or kidney disease.  Being 65 years of age or older.  Having a disability.  Living in a place that is high in altitude, where thinner, drier air causes more fluid loss.  Doing exercises that put stress on your body for a long time (endurance sports). What are the signs or symptoms? Symptoms of dehydration depend on how severe it is. Mild or moderate dehydration  Thirst.  Dry lips or dry mouth.  Dizziness or light-headedness, especially when standing up from a seated position.  Muscle cramps.  Dark urine. Urine may be the color of tea.  Less urine or tears produced than usual.  Headache. Severe dehydration  Changes in skin. Your skin may be cold and clammy, blotchy, or pale. Your skin also may not return to normal after being  lightly pinched and released.  Little or no tears, urine, or sweat.  Changes in vital signs, such as rapid breathing and low blood pressure. Your pulse may be weak or may be faster than 100 beats a minute when you are sitting still.  Other changes, such as: ? Feeling very thirsty. ? Sunken eyes. ? Cold hands and feet. ? Confusion. ? Being very tired (lethargic) or having trouble waking from sleep. ? Short-term weight loss. ? Loss of consciousness. How is this diagnosed? This condition is diagnosed based on your symptoms and a physical exam. You may have blood and urine tests to help confirm the diagnosis. How is this treated? Treatment for this condition depends on how severe it is. Treatment should be started right away. Do not wait until dehydration becomes severe. Severe dehydration is an emergency and needs to be treated in a hospital.  Mild or moderate dehydration can be treated at home. You may be asked to: ? Drink more fluids. ? Drink an oral rehydration solution (ORS). This drink helps restore proper amounts of fluids and salts and minerals in the blood (electrolytes).  Severe dehydration can be treated: ? With IV fluids. ? By correcting abnormal levels of electrolytes. This is often done by giving electrolytes through a tube that is passed through your nose and into your stomach (nasogastric tube, or NG tube). ? By treating the underlying cause of dehydration. Follow these instructions at home: Oral rehydration solution If told by your health care provider, drink an ORS:  Make   an ORS by following instructions on the package.  Start by drinking small amounts, about  cup (120 mL) every 5-10 minutes.  Slowly increase how much you drink until you have taken the amount recommended by your health care provider. Eating and drinking  Drink enough clear fluid to keep your urine pale yellow. If you were told to drink an ORS, finish the ORS first and then start slowly drinking  other clear fluids. Drink fluids such as: ? Water. Do not drink only water. Doing that can lead to hyponatremia, which is having too little salt (sodium) in the body. ? Water from ice chips you suck on. ? Fruit juice that you have added water to (diluted fruit juice). ? Low-calorie sports drinks.  Eat foods that contain a healthy balance of electrolytes, such as bananas, oranges, potatoes, tomatoes, and spinach.  Do not drink alcohol.  Avoid the following: ? Drinks that contain a lot of sugar. These include high-calorie sports drinks, fruit juice that is not diluted, and soda. ? Caffeine. ? Foods that are greasy or contain a lot of fat or sugar.         General instructions  Take over-the-counter and prescription medicines only as told by your health care provider.  Do not take sodium tablets. Doing that can lead to having too much sodium in the body (hypernatremia).  Return to your normal activities as told by your health care provider. Ask your health care provider what activities are safe for you.  Keep all follow-up visits as told by your health care provider. This is important. Contact a health care provider if:  You have muscle cramps, pain, or discomfort, such as: ? Pain in your abdomen and the pain gets worse or stays in one area (localizes). ? Stiff neck.  You have a rash.  You are more irritable than usual.  You are sleepier or have a harder time waking than usual.  You feel weak or dizzy.  You feel very thirsty. Get help right away if you have:  Any symptoms of severe dehydration.  Symptoms of vomiting, such as: ? You cannot eat or drink without vomiting. ? Vomiting gets worse or does not go away. ? Vomit includes blood or green matter (bile).  Symptoms that get worse with treatment.  A fever.  A severe headache.  Problems with urination or bowel movements, such as: ? Diarrhea that gets worse or does not go away. ? Blood in your stool (feces).  This may cause stool to look black and tarry. ? Not urinating, or urinating only a small amount of very dark urine, within 6-8 hours.  Trouble breathing. These symptoms may represent a serious problem that is an emergency. Do not wait to see if the symptoms will go away. Get medical help right away. Call your local emergency services (911 in the [place] Do not drive yourself to the hospital. Summary  Dehydration is a condition in which there is not enough water or other fluids in the body. This happens when a person loses more fluids than he or she takes in.  Treatment for this condition depends on how severe it is. Treatment should be started right away. Do not wait until dehydration becomes severe.  Drink enough clear fluid to keep your urine pale yellow. If you were told to drink an oral rehydration solution (ORS), finish the ORS first and then start slowly drinking other clear fluids.  Take over-the-counter and prescription medicines only as told by your health   care provider.  Get help right away if you have any symptoms of severe dehydration. This information is not intended to replace advice given to you by your health care provider. Make sure you discuss any questions you have with your health care provider. Document Revised: 05/30/2019 Document Reviewed: 05/30/2019 Elsevier Patient Education  2021 [employer] Inc[employer]  

## 2020-12-03 NOTE — Addendum Note (Signed)
Addended by: Burney Gauze R on: 12/03/2020 06:00 PM   Modules accepted: Orders

## 2020-12-03 NOTE — Progress Notes (Signed)
Hematology and Oncology Follow Up Visit  Bruce Smith [id number] 1950/11/25 70 y.o. 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  He actually was seen at [facility] yesterday.  He has been evaluated for stem cell transplant.  They will take him as a candidate.  The absolutely did a bone marrow biopsy on him yesterday.  They are worried about his overall condition.  He is seeing his Orthopedist tomorrow.  He is having a lot of pain in the left thigh where he had a rod placed for an impending fracture.  He has had past radiation therapy.  The real problem is that he just is not eating.  No matter what we tried him nothing works.  He just does not have an appetite.  This is incredibly stressful.  He is dehydrated.  He has hyponatremia.  [facility] is worried about the hyponatremia I would like to have this addressed.  He has had no fever.  He is in constant pain because of his bony lesions.  He has had no diarrhea.  There is been no bleeding.  He has had constant nausea.  Again, no matter what we use on him nothing seems to work.  We have again to try Zyprexa.  Maybe this will help.  He clearly needs IV fluids today.  He comes in with his sister.  She is quite helpful.  Overall, his performance status is ECOG 2, at best.    Medications:  Current Outpatient Medications:  .  acetaminophen (TYLENOL) 500 MG tablet, Take 1,000 mg by mouth daily as needed., Disp: , Rfl:  .  aspirin 81 MG EC tablet, Take 81 mg by mouth daily. Swallow whole., Disp: ,  Rfl:  .  atorvastatin (LIPITOR) 40 MG tablet, Take 20 mg by mouth daily., Disp: , Rfl:  .  ergocalciferol (VITAMIN D2) 1.25 MG (50000 UT) capsule, Take by mouth once a week., Disp: , Rfl:  .  famciclovir (FAMVIR) 500 MG tablet, Take 1 tablet (500 mg total) by mouth daily., Disp: 30 tablet, Rfl: 8 .  fentaNYL 37.5 MCG/HR PT72, Place onto the skin., Disp: , Rfl:  .  HYDROmorphone (DILAUDID) 2 MG tablet, Take 0.5 tablets (1 mg total) by mouth every 12 (twelve) hours as needed for severe pain. (Patient taking differently: Take 2 mg by mouth every 8 (eight) hours as needed for severe pain.), Disp: 30 tablet, Rfl: 0 .  metoCLOPramide (REGLAN) 10 MG tablet, Take 1 tablet (10 mg total) by mouth 4 (four) times daily -  before meals and at bedtime. (Patient taking differently: Take 10 mg by mouth 2 (two) times daily.), Disp: 120 tablet, Rfl: 3 .  metoprolol succinate (TOPROL-XL) 50 MG 24 hr tablet, Take 50 mg by mouth daily., Disp: , Rfl:  .  omeprazole (PRILOSEC) 40 MG capsule, Take 40 mg by mouth daily., Disp: , Rfl:  .  polyethylene glycol (MIRALAX) 17 g packet, Take 17 g by mouth daily., Disp: 30 each, Rfl: 1 .  sennosides-docusate sodium (SENOKOT-S) 8.6-50 MG tablet,  Take 2 tablets by mouth daily., Disp: 30 tablet, Rfl: 1 .  silver sulfADIAZINE (SILVADENE) 1 % cream, Apply 1 application topically 2 (two) times daily. (Patient taking differently: Apply 1 application topically 2 (two) times daily as needed (bed sores).), Disp: 50 g, Rfl: 0 .  dexamethasone (DECADRON) 4 MG tablet, Take 4 mg by mouth daily. Take one tablet for 3 days after chemotherapy. (Patient not taking: Reported on 12/03/2020), Disp: , Rfl:  .  dronabinol (MARINOL) 5 MG capsule, Take 5 mg by mouth daily as needed (nausea). (Patient not taking: Reported on 12/03/2020), Disp: , Rfl:  .  [START ON 12/12/2020] filgrastim-aafi (NIVESTYM) 300 MCG/0.5ML SOSY injection, Inject into the skin., Disp: , Rfl:  .  LORazepam (ATIVAN) 0.5 MG tablet, Take 1  tablet (0.5 mg total) by mouth every 6 (six) hours as needed for anxiety (nausea). (Patient not taking: No sig reported), Disp: 40 tablet, Rfl: 0 .  megestrol (MEGACE ES) 625 MG/5ML suspension, Take 5 mLs (625 mg total) by mouth daily. (Patient not taking: Reported on 12/03/2020), Disp: 150 mL, Rfl: 4 .  naloxone (NARCAN) nasal spray 4 mg/0.1 mL, Place 1 spray into the nose once. (Patient not taking: Reported on 12/03/2020), Disp: , Rfl:  .  nitroGLYCERIN (NITROSTAT) 0.4 MG SL tablet, Place 0.4 mg under the tongue every 5 (five) minutes as needed for chest pain. (Patient not taking: Reported on 12/03/2020), Disp: , Rfl:  .  pregabalin (LYRICA) 100 MG capsule, Take 1 capsule (100 mg total) by mouth daily. (Patient not taking: Reported on 12/03/2020), Disp: 60 capsule, Rfl: 0  Allergies: No Known Allergies  Past Medical History, Surgical history, Social history, and Family History were reviewed and updated.  Review of Systems: Review of Systems  Constitutional: Negative.   HENT:  Negative.   Eyes: Negative.   Respiratory: Negative.   Cardiovascular: Negative.   Gastrointestinal: Negative.   Endocrine: Negative.   Genitourinary: Negative.    Musculoskeletal: Positive for arthralgias.  Skin: Negative.   Neurological: Negative.   Hematological: Negative.   Psychiatric/Behavioral: Negative.     Physical Exam:  weight is 162 lb (73.5 kg).   Wt Readings from Last 3 Encounters:  12/03/20 162 lb (73.5 kg)  11/03/20 169 lb (76.7 kg)  10/29/20 171 lb (77.6 kg)    Physical Exam Vitals reviewed.  HENT:     Head: Normocephalic and atraumatic.  Eyes:     Pupils: Pupils are equal, round, and reactive to light.  Cardiovascular:     Rate and Rhythm: Normal rate and regular rhythm.     Heart sounds: Normal heart sounds.  Pulmonary:     Effort: Pulmonary effort is normal.     Breath sounds: Normal breath sounds.  Abdominal:     General: Bowel sounds are normal.     Palpations: Abdomen is soft.   Musculoskeletal:        General: No tenderness or deformity. Normal range of motion.     Cervical back: Normal range of motion.     Comments: He has the left hip surgical scar that is healed.  He has some decreased range of motion of the left hip area.  He has some discomfort in the left shoulder blade.  He does seem to have little bit of better range of motion in this area.  Lymphadenopathy:     Cervical: No cervical adenopathy.  Skin:    General: Skin is warm and dry.     Findings: No erythema or rash.  Neurological:     Mental Status: He is alert and oriented to person, place, and time.  Psychiatric:        Behavior: Behavior normal.        Thought Content: Thought content normal.        Judgment: Judgment normal.    Lab Results  Component Value Date   WBC 7.0 12/03/2020   HGB 12.5 (L) 12/03/2020   HCT 35.2 (L) 12/03/2020   MCV 87.1 12/03/2020   PLT 356 12/03/2020     Chemistry      Component Value Date/Time   NA 125 (L) 12/03/2020 1043   K 4.1 12/03/2020 1043   CL 96 (L) 12/03/2020 1043   CO2 21 (L) 12/03/2020 1043   BUN 19 12/03/2020 1043   CREATININE 0.71 12/03/2020 1043      Component Value Date/Time   CALCIUM 8.8 (L) 12/03/2020 1043   ALKPHOS 88 12/03/2020 1043   AST 15 12/03/2020 1043   ALT 29 12/03/2020 1043   BILITOT 0.5 12/03/2020 1043      Impression and Plan: Bruce Smith is a very nice 70 year old white male.  He has kappa light chain myeloma.   Again, we are just having a real hard time with him and can try to get him to eat more.  I just wish he was better so that he would be able to do better with the transplant.  We will see about his IV fluids today.  He will get IV fluids today and tomorrow.  I will call in some Zyprexa for him.  Maybe this will help with the appetite and with the nausea.  He is not on treatment right now.  I do not know if the pain medications make him nauseous.  [facility] is doing a good job with trying to handle his pain  medication.  We will have to follow him closely.  We probably will have to have him come back next week or so.     Volanda Napoleon, MD 2/3/20221:30 PM

## 2020-12-04 ENCOUNTER — Inpatient Hospital Stay: Payer: BC Managed Care – PPO

## 2020-12-04 VITALS — BP 148/70 | HR 87 | Temp 98.3°F | Resp 18

## 2020-12-04 DIAGNOSIS — C9000 Multiple myeloma not having achieved remission: Secondary | ICD-10-CM

## 2020-12-04 LAB — CMP (CANCER CENTER ONLY)
ALT: 34 U/L (ref 0–44)
AST: 18 U/L (ref 15–41)
Albumin: 4 g/dL (ref 3.5–5.0)
Alkaline Phosphatase: 84 U/L (ref 38–126)
Anion gap: 11 (ref 5–15)
BUN: 31 mg/dL — ABNORMAL HIGH (ref 8–23)
CO2: 23 mmol/L (ref 22–32)
Calcium: 8.9 mg/dL (ref 8.9–10.3)
Chloride: 96 mmol/L — ABNORMAL LOW (ref 98–111)
Creatinine: 0.93 mg/dL (ref 0.61–1.24)
GFR, Estimated: >60 mL/min (ref >60)
Glucose, Bld: 162 mg/dL — ABNORMAL HIGH (ref 70–99)
Potassium: 5 mmol/L (ref 3.5–5.1)
Sodium: 130 mmol/L — ABNORMAL LOW (ref 135–145)
Total Bilirubin: 0.5 mg/dL (ref 0.3–1.2)
Total Protein: 5.9 g/dL — ABNORMAL LOW (ref 6.5–8.1)

## 2020-12-04 LAB — CBC WITH DIFFERENTIAL (CANCER CENTER ONLY)
Abs Immature Granulocytes: 0.09 10*3/uL — ABNORMAL HIGH (ref 0.00–0.07)
Basophils Absolute: 0 10*3/uL (ref 0.0–0.1)
Basophils Relative: 0 %
Eosinophils Absolute: 0 10*3/uL (ref 0.0–0.5)
Eosinophils Relative: 0 %
HCT: 35.9 % — ABNORMAL LOW (ref 39.0–52.0)
Hemoglobin: 12.4 g/dL — ABNORMAL LOW (ref 13.0–17.0)
Immature Granulocytes: 1 %
Lymphocytes Relative: 5 %
Lymphs Abs: 0.4 10*3/uL — ABNORMAL LOW (ref 0.7–4.0)
MCH: 31 pg (ref 26.0–34.0)
MCHC: 34.5 g/dL (ref 30.0–36.0)
MCV: 89.8 fL (ref 80.0–100.0)
Monocytes Absolute: 0.9 10*3/uL (ref 0.1–1.0)
Monocytes Relative: 10 %
Neutro Abs: 7.3 10*3/uL (ref 1.7–7.7)
Neutrophils Relative %: 84 %
Platelet Count: 381 10*3/uL (ref 150–400)
RBC: 4 MIL/uL — ABNORMAL LOW (ref 4.22–5.81)
RDW: 15.2 % (ref 11.5–15.5)
WBC Count: 8.7 10*3/uL (ref 4.0–10.5)
nRBC: 0 % (ref 0.0–0.2)

## 2020-12-04 MED ORDER — ACETAMINOPHEN 325 MG PO TABS
650.0000 mg | ORAL_TABLET | Freq: Once | ORAL | Status: AC
  Administered 2020-12-04: 650 mg via ORAL

## 2020-12-04 MED ORDER — ACETAMINOPHEN 325 MG PO TABS
ORAL_TABLET | ORAL | Status: AC
  Filled 2020-12-04: qty 2

## 2020-12-04 MED ORDER — SODIUM CHLORIDE 0.9 % IV SOLN
INTRAVENOUS | Status: DC
  Filled 2020-12-04 (×2): qty 250

## 2020-12-04 NOTE — Patient Instructions (Signed)
Dehydration, Adult Dehydration is condition in which there is not enough water or other fluids in the body. This happens when a person loses more fluids than he or she takes in. Important body parts cannot work right without the right amount of fluids. Any loss of fluids from the body can cause dehydration. Dehydration can be mild, worse, or very bad. It should be treated right away to keep it from getting very bad. What are the causes? This condition may be caused by:  Conditions that cause loss of water or other fluids, such as: ? Watery poop (diarrhea). ? Vomiting. ? Sweating a lot. ? Peeing (urinating) a lot.  Not drinking enough fluids, especially when you: ? Are ill. ? Are doing things that take a lot of energy to do.  Other illnesses and conditions, such as fever or infection.  Certain medicines, such as medicines that take extra fluid out of the body (diuretics).  Lack of safe drinking water.  Not being able to get enough water and food. What increases the risk? The following factors may make you more likely to develop this condition:  Having a long-term (chronic) illness that has not been treated the right way, such as: ? Diabetes. ? Heart disease. ? Kidney disease.  Being 65 years of age or older.  Having a disability.  Living in a place that is high above the ground or sea (high in altitude). The thinner, dried air causes more fluid loss.  Doing exercises that put stress on your body for a long time. What are the signs or symptoms? Symptoms of dehydration depend on how bad it is. Mild or worse dehydration  Thirst.  Dry lips or dry mouth.  Feeling dizzy or light-headed, especially when you stand up from sitting.  Muscle cramps.  Your body making: ? Dark pee (urine). Pee may be the color of tea. ? Less pee than normal. ? Less tears than normal.  Headache. Very bad dehydration  Changes in skin. Skin may: ? Be cold to the touch (clammy). ? Be blotchy  or pale. ? Not go back to normal right after you lightly pinch it and let it go.  Little or no tears, pee, or sweat.  Changes in vital signs, such as: ? Fast breathing. ? Low blood pressure. ? Weak pulse. ? Pulse that is more than 100 beats a minute when you are sitting still.  Other changes, such as: ? Feeling very thirsty. ? Eyes that look hollow (sunken). ? Cold hands and feet. ? Being mixed up (confused). ? Being very tired (lethargic) or having trouble waking from sleep. ? Short-term weight loss. ? Loss of consciousness. How is this treated? Treatment for this condition depends on how bad it is. Treatment should start right away. Do not wait until your condition gets very bad. Very bad dehydration is an emergency. You will need to go to a hospital.  Mild or worse dehydration can be treated at home. You may be asked to: ? Drink more fluids. ? Drink an oral rehydration solution (ORS). This drink helps get the right amounts of fluids and salts and minerals in the blood (electrolytes).  Very bad dehydration can be treated: ? With fluids through an IV tube. ? By getting normal levels of salts and minerals in your blood. This is often done by giving salts and minerals through a tube. The tube is passed through your nose and into your stomach. ? By treating the root cause. Follow these instructions at   home: Oral rehydration solution If told by your doctor, drink an ORS:  Make an ORS. Use instructions on the package.  Start by drinking small amounts, about  cup (120 mL) every 5-10 minutes.  Slowly drink more until you have had the amount that your doctor said to have. Eating and drinking  Drink enough clear fluid to keep your pee pale yellow. If you were told to drink an ORS, finish the ORS first. Then, start slowly drinking other clear fluids. Drink fluids such as: ? Water. Do not drink only water. Doing that can make the salt (sodium) level in your body get too low. ? Water  from ice chips you suck on. ? Fruit juice that you have added water to (diluted). ? Low-calorie sports drinks.  Eat foods that have the right amounts of salts and minerals, such as: ? Bananas. ? Oranges. ? Potatoes. ? Tomatoes. ? Spinach.  Do not drink alcohol.  Avoid: ? Drinks that have a lot of sugar. These include:  High-calorie sports drinks.  Fruit juice that you did not add water to.  Soda.  Caffeine. ? Foods that are greasy or have a lot of fat or sugar.         General instructions  Take over-the-counter and prescription medicines only as told by your doctor.  Do not take salt tablets. Doing that can make the salt level in your body get too high.  Return to your normal activities as told by your doctor. Ask your doctor what activities are safe for you.  Keep all follow-up visits as told by your doctor. This is important. Contact a doctor if:  You have pain in your belly (abdomen) and the pain: ? Gets worse. ? Stays in one place.  You have a rash.  You have a stiff neck.  You get angry or annoyed (irritable) more easily than normal.  You are more tired or have a harder time waking than normal.  You feel: ? Weak or dizzy. ? Very thirsty. Get help right away if you have:  Any symptoms of very bad dehydration.  Symptoms of vomiting, such as: ? You cannot eat or drink without vomiting. ? Your vomiting gets worse or does not go away. ? Your vomit has blood or green stuff in it.  Symptoms that get worse with treatment.  A fever.  A very bad headache.  Problems with peeing or pooping (having a bowel movement), such as: ? Watery poop that gets worse or does not go away. ? Blood in your poop (stool). This may cause poop to look black and tarry. ? Not peeing in 6-8 hours. ? Peeing only a small amount of very dark pee in 6-8 hours.  Trouble breathing. These symptoms may be an emergency. Do not wait to see if the symptoms will go away. Get  medical help right away. Call your local emergency services (911 in the [place] Do not drive yourself to the hospital. Summary  Dehydration is a condition in which there is not enough water or other fluids in the body. This happens when a person loses more fluids than he or she takes in.  Treatment for this condition depends on how bad it is. Treatment should be started right away. Do not wait until your condition gets very bad.  Drink enough clear fluid to keep your pee pale yellow. If you were told to drink an oral rehydration solution (ORS), finish the ORS first. Then, start slowly drinking other clear fluids.    Take over-the-counter and prescription medicines only as told by your doctor.  Get help right away if you have any symptoms of very bad dehydration. This information is not intended to replace advice given to you by your health care provider. Make sure you discuss any questions you have with your health care provider. Document Revised: 05/30/2019 Document Reviewed: 05/30/2019 Elsevier Patient Education  2021 Elsevier Inc.  

## 2020-12-07 ENCOUNTER — Telehealth: Payer: Self-pay | Admitting: *Deleted

## 2020-12-07 ENCOUNTER — Telehealth: Payer: Self-pay

## 2020-12-07 DIAGNOSIS — G893 Neoplasm related pain (acute) (chronic): Secondary | ICD-10-CM | POA: Insufficient documentation

## 2020-12-07 NOTE — Telephone Encounter (Signed)
Called the home number for the pt and s/w kim.  Aware of appts on wed and thurs this week     Letha Mirabal

## 2020-12-07 NOTE — Telephone Encounter (Signed)
Per previous phone note, patient advised by [facility] to get in earlier in the week in the event he needs fluids throughout the week.  No available appts on Sarahs schedule.  Dr E is ok with patient not being seen by Judson Roch on Thursday.  Wife states she doesn't feel he needs to sarah either as he is doing better.  Appt for labs and IVF put in for Wednesday.  Will check with Dr [name] to see if additional days needed by looking at patient labs.

## 2020-12-07 NOTE — Telephone Encounter (Signed)
Patient called wanting to move 12/10/2020 appt up to 12/09/20 because being seen by [facility] and may need to have Calcium infusions done prior to [facility] visit.  Msg sent to schedulers

## 2020-12-08 ENCOUNTER — Other Ambulatory Visit: Payer: Self-pay | Admitting: *Deleted

## 2020-12-08 DIAGNOSIS — C9000 Multiple myeloma not having achieved remission: Secondary | ICD-10-CM

## 2020-12-09 ENCOUNTER — Encounter: Payer: Self-pay | Admitting: Family

## 2020-12-09 ENCOUNTER — Inpatient Hospital Stay: Payer: BC Managed Care – PPO | Admitting: Hematology & Oncology

## 2020-12-09 ENCOUNTER — Telehealth: Payer: Self-pay

## 2020-12-09 ENCOUNTER — Inpatient Hospital Stay: Payer: BC Managed Care – PPO

## 2020-12-09 ENCOUNTER — Inpatient Hospital Stay: Payer: BC Managed Care – PPO | Admitting: Family

## 2020-12-09 ENCOUNTER — Other Ambulatory Visit: Payer: Self-pay

## 2020-12-09 ENCOUNTER — Encounter: Payer: Self-pay | Admitting: *Deleted

## 2020-12-09 VITALS — BP 108/68 | HR 102 | Temp 98.2°F | Resp 18

## 2020-12-09 VITALS — BP 108/68 | HR 102 | Temp 98.2°F | Resp 18 | Ht 68.0 in | Wt 158.0 lb

## 2020-12-09 DIAGNOSIS — R110 Nausea: Secondary | ICD-10-CM

## 2020-12-09 DIAGNOSIS — C9000 Multiple myeloma not having achieved remission: Secondary | ICD-10-CM

## 2020-12-09 DIAGNOSIS — E860 Dehydration: Secondary | ICD-10-CM

## 2020-12-09 LAB — COMPREHENSIVE METABOLIC PANEL
ALT: 62 U/L — ABNORMAL HIGH (ref 0–44)
AST: 20 U/L (ref 15–41)
Albumin: 3.8 g/dL (ref 3.5–5.0)
Alkaline Phosphatase: 91 U/L (ref 38–126)
Anion gap: 9 (ref 5–15)
BUN: 26 mg/dL — ABNORMAL HIGH (ref 8–23)
CO2: 22 mmol/L (ref 22–32)
Calcium: 8.9 mg/dL (ref 8.9–10.3)
Chloride: 101 mmol/L (ref 98–111)
Creatinine, Ser: 0.83 mg/dL (ref 0.61–1.24)
GFR, Estimated: >60 mL/min (ref >60)
Glucose, Bld: 114 mg/dL — ABNORMAL HIGH (ref 70–99)
Potassium: 4.1 mmol/L (ref 3.5–5.1)
Sodium: 132 mmol/L — ABNORMAL LOW (ref 135–145)
Total Bilirubin: 0.6 mg/dL (ref 0.3–1.2)
Total Protein: 5.3 g/dL — ABNORMAL LOW (ref 6.5–8.1)

## 2020-12-09 LAB — CBC WITH DIFFERENTIAL (CANCER CENTER ONLY)
Abs Immature Granulocytes: 0.13 10*3/uL — ABNORMAL HIGH (ref 0.00–0.07)
Basophils Absolute: 0 10*3/uL (ref 0.0–0.1)
Basophils Relative: 0 %
Eosinophils Absolute: 0 10*3/uL (ref 0.0–0.5)
Eosinophils Relative: 0 %
HCT: 37.4 % — ABNORMAL LOW (ref 39.0–52.0)
Hemoglobin: 12.8 g/dL — ABNORMAL LOW (ref 13.0–17.0)
Immature Granulocytes: 2 %
Lymphocytes Relative: 5 %
Lymphs Abs: 0.3 10*3/uL — ABNORMAL LOW (ref 0.7–4.0)
MCH: 31.2 pg (ref 26.0–34.0)
MCHC: 34.2 g/dL (ref 30.0–36.0)
MCV: 91.2 fL (ref 80.0–100.0)
Monocytes Absolute: 0.8 10*3/uL (ref 0.1–1.0)
Monocytes Relative: 10 %
Neutro Abs: 6.3 10*3/uL (ref 1.7–7.7)
Neutrophils Relative %: 83 %
Platelet Count: 302 10*3/uL (ref 150–400)
RBC: 4.1 MIL/uL — ABNORMAL LOW (ref 4.22–5.81)
RDW: 15.3 % (ref 11.5–15.5)
WBC Count: 7.6 10*3/uL (ref 4.0–10.5)
nRBC: 0 % (ref 0.0–0.2)

## 2020-12-09 MED ORDER — SODIUM CHLORIDE 0.9 % IV SOLN: 8.0000 mg | Freq: Once | INTRAVENOUS | Status: DC

## 2020-12-09 MED ORDER — SODIUM CHLORIDE 0.9 % IV SOLN
Freq: Once | INTRAVENOUS | Status: DC
  Filled 2020-12-09: qty 250

## 2020-12-09 MED ORDER — ONDANSETRON HCL 4 MG/2ML IJ SOLN
INTRAMUSCULAR | Status: AC
  Filled 2020-12-09: qty 4

## 2020-12-09 MED ORDER — ONDANSETRON HCL 4 MG/2ML IJ SOLN
8.0000 mg | Freq: Once | INTRAMUSCULAR | Status: DC
  Administered 2020-12-09: 8 mg via INTRAVENOUS

## 2020-12-09 NOTE — Patient Instructions (Signed)
Dehydration, Adult Dehydration is condition in which there is not enough water or other fluids in the body. This happens when a person loses more fluids than he or she takes in. Important body parts cannot work right without the right amount of fluids. Any loss of fluids from the body can cause dehydration. Dehydration can be mild, worse, or very bad. It should be treated right away to keep it from getting very bad. What are the causes? This condition may be caused by:  Conditions that cause loss of water or other fluids, such as: ? Watery poop (diarrhea). ? Vomiting. ? Sweating a lot. ? Peeing (urinating) a lot.  Not drinking enough fluids, especially when you: ? Are ill. ? Are doing things that take a lot of energy to do.  Other illnesses and conditions, such as fever or infection.  Certain medicines, such as medicines that take extra fluid out of the body (diuretics).  Lack of safe drinking water.  Not being able to get enough water and food. What increases the risk? The following factors may make you more likely to develop this condition:  Having a long-term (chronic) illness that has not been treated the right way, such as: ? Diabetes. ? Heart disease. ? Kidney disease.  Being 65 years of age or older.  Having a disability.  Living in a place that is high above the ground or sea (high in altitude). The thinner, dried air causes more fluid loss.  Doing exercises that put stress on your body for a long time. What are the signs or symptoms? Symptoms of dehydration depend on how bad it is. Mild or worse dehydration  Thirst.  Dry lips or dry mouth.  Feeling dizzy or light-headed, especially when you stand up from sitting.  Muscle cramps.  Your body making: ? Dark pee (urine). Pee may be the color of tea. ? Less pee than normal. ? Less tears than normal.  Headache. Very bad dehydration  Changes in skin. Skin may: ? Be cold to the touch (clammy). ? Be blotchy  or pale. ? Not go back to normal right after you lightly pinch it and let it go.  Little or no tears, pee, or sweat.  Changes in vital signs, such as: ? Fast breathing. ? Low blood pressure. ? Weak pulse. ? Pulse that is more than 100 beats a minute when you are sitting still.  Other changes, such as: ? Feeling very thirsty. ? Eyes that look hollow (sunken). ? Cold hands and feet. ? Being mixed up (confused). ? Being very tired (lethargic) or having trouble waking from sleep. ? Short-term weight loss. ? Loss of consciousness. How is this treated? Treatment for this condition depends on how bad it is. Treatment should start right away. Do not wait until your condition gets very bad. Very bad dehydration is an emergency. You will need to go to a hospital.  Mild or worse dehydration can be treated at home. You may be asked to: ? Drink more fluids. ? Drink an oral rehydration solution (ORS). This drink helps get the right amounts of fluids and salts and minerals in the blood (electrolytes).  Very bad dehydration can be treated: ? With fluids through an IV tube. ? By getting normal levels of salts and minerals in your blood. This is often done by giving salts and minerals through a tube. The tube is passed through your nose and into your stomach. ? By treating the root cause. Follow these instructions at   home: Oral rehydration solution If told by your doctor, drink an ORS:  Make an ORS. Use instructions on the package.  Start by drinking small amounts, about  cup (120 mL) every 5-10 minutes.  Slowly drink more until you have had the amount that your doctor said to have. Eating and drinking  Drink enough clear fluid to keep your pee pale yellow. If you were told to drink an ORS, finish the ORS first. Then, start slowly drinking other clear fluids. Drink fluids such as: ? Water. Do not drink only water. Doing that can make the salt (sodium) level in your body get too low. ? Water  from ice chips you suck on. ? Fruit juice that you have added water to (diluted). ? Low-calorie sports drinks.  Eat foods that have the right amounts of salts and minerals, such as: ? Bananas. ? Oranges. ? Potatoes. ? Tomatoes. ? Spinach.  Do not drink alcohol.  Avoid: ? Drinks that have a lot of sugar. These include:  High-calorie sports drinks.  Fruit juice that you did not add water to.  Soda.  Caffeine. ? Foods that are greasy or have a lot of fat or sugar.         General instructions  Take over-the-counter and prescription medicines only as told by your doctor.  Do not take salt tablets. Doing that can make the salt level in your body get too high.  Return to your normal activities as told by your doctor. Ask your doctor what activities are safe for you.  Keep all follow-up visits as told by your doctor. This is important. Contact a doctor if:  You have pain in your belly (abdomen) and the pain: ? Gets worse. ? Stays in one place.  You have a rash.  You have a stiff neck.  You get angry or annoyed (irritable) more easily than normal.  You are more tired or have a harder time waking than normal.  You feel: ? Weak or dizzy. ? Very thirsty. Get help right away if you have:  Any symptoms of very bad dehydration.  Symptoms of vomiting, such as: ? You cannot eat or drink without vomiting. ? Your vomiting gets worse or does not go away. ? Your vomit has blood or green stuff in it.  Symptoms that get worse with treatment.  A fever.  A very bad headache.  Problems with peeing or pooping (having a bowel movement), such as: ? Watery poop that gets worse or does not go away. ? Blood in your poop (stool). This may cause poop to look black and tarry. ? Not peeing in 6-8 hours. ? Peeing only a small amount of very dark pee in 6-8 hours.  Trouble breathing. These symptoms may be an emergency. Do not wait to see if the symptoms will go away. Get  medical help right away. Call your local emergency services (911 in the [place] Do not drive yourself to the hospital. Summary  Dehydration is a condition in which there is not enough water or other fluids in the body. This happens when a person loses more fluids than he or she takes in.  Treatment for this condition depends on how bad it is. Treatment should be started right away. Do not wait until your condition gets very bad.  Drink enough clear fluid to keep your pee pale yellow. If you were told to drink an oral rehydration solution (ORS), finish the ORS first. Then, start slowly drinking other clear fluids.    Take over-the-counter and prescription medicines only as told by your doctor.  Get help right away if you have any symptoms of very bad dehydration. This information is not intended to replace advice given to you by your health care provider. Make sure you discuss any questions you have with your health care provider. Document Revised: 05/30/2019 Document Reviewed: 05/30/2019 Elsevier Patient Education  2021 Elsevier Inc.  

## 2020-12-09 NOTE — Telephone Encounter (Signed)
(  per your los and he states that he will be at [facility] 2/15-2/18 and then again the week of 2/21.  All that I scheduled is labs and inf time for 12/14/20.)  Not to Ronnie Doss

## 2020-12-09 NOTE — Progress Notes (Signed)
Hematology and Oncology Follow Up Visit  Bruce Smith [id number] 1951/02/03 70 y.o. 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 Rubi is here today for follow-up regarding his sodium and hydration. He is doing fairly well.  Sodium is now 132! Chloride is 101.  He states that his nausea is a little better on Zyprexa.  He still has nausea and little appetite but has been drinking 3-4 Ensure daily.  He has some lightheadedness at times.  No falls or syncope to report.  He is still having pain in the left hip. He denies back pain at this time.  No swelling in his extremities.  No fever, chills, cough, rash, SOB, chest pain, palpitations, abdominal pain or changes in bowel or bladder habits.  He denies noting any blood loss. No abnormal bruising or petechiae.  He is doing his best to stay well hydrated. His weight is down 4 lbs at 158 lbs since his last visit on 2/3.   ECOG Performance Status: 1 - Symptomatic but completely ambulatory  Medications:  Allergies as of 12/09/2020   No Known Allergies     Medication List       Accurate as of December 09, 2020  3:09 PM. If you have any questions, ask your nurse or doctor.        acetaminophen 500 MG tablet Commonly known as: TYLENOL Take 1,000 mg by mouth daily as needed.   aspirin 81 MG EC tablet Take 81 mg by mouth daily. Swallow whole.   atorvastatin 40 MG tablet Commonly known as: LIPITOR Take 20 mg by mouth daily.   dexamethasone 4 MG tablet Commonly known as: DECADRON Take 4 mg by mouth daily. Take one tablet  for 3 days after chemotherapy.   dronabinol 5 MG capsule Commonly known as: MARINOL Take 5 mg by mouth daily as needed (nausea).   ergocalciferol 1.25 MG (50000 UT) capsule Commonly known as: VITAMIN D2 Take by mouth once a week.   famciclovir 500 MG tablet Commonly known as: FAMVIR Take 1 tablet (500 mg total) by mouth daily.   fentaNYL 37.5 MCG/HR Pt72 Place onto the skin.   filgrastim-aafi 300 MCG/0.5ML Sosy injection Commonly known as: NIVESTYM Inject into the skin. Start taking on: December 12, 2020   HYDROmorphone 2 MG tablet Commonly known as: DILAUDID Take 0.5 tablets (1 mg total) by mouth every 12 (twelve) hours as needed for severe pain. What changed:   how much to take  when to take this   LORazepam 0.5 MG tablet Commonly known as: ATIVAN Take 1 tablet (0.5 mg total) by mouth every 6 (six) hours as needed for anxiety (nausea).   megestrol 625 MG/5ML suspension Commonly known as: MEGACE ES Take 5 mLs (625 mg total) by mouth daily.   metoCLOPramide 10 MG tablet Commonly known as: REGLAN Take 1 tablet (10 mg total) by mouth 4 (four) times daily -  before meals and at bedtime. What changed: when to take this   metoprolol succinate 50 MG 24 hr tablet Commonly known as: TOPROL-XL Take 50 mg by mouth  daily.   naloxone 4 MG/0.1ML Liqd nasal spray kit Commonly known as: NARCAN Place 1 spray into the nose once.   nitroGLYCERIN 0.4 MG SL tablet Commonly known as: NITROSTAT Place 0.4 mg under the tongue every 5 (five) minutes as needed for chest pain.   OLANZapine 10 MG tablet Commonly known as: ZYPREXA Take 1 tablet (10 mg total) by mouth at bedtime.   omeprazole 40 MG capsule Commonly known as: PRILOSEC Take 40 mg by mouth daily.   polyethylene glycol 17 g packet Commonly known as: MiraLax Take 17 g by mouth daily.   pregabalin 100 MG capsule Commonly known as: LYRICA Take 1 capsule (100 mg total) by mouth daily.   sennosides-docusate sodium  8.6-50 MG tablet Commonly known as: SENOKOT-S Take 2 tablets by mouth daily.   silver sulfADIAZINE 1 % cream Commonly known as: Silvadene Apply 1 application topically 2 (two) times daily. What changed:   when to take this  reasons to take this   tamsulosin 0.4 MG Caps capsule Commonly known as: FLOMAX Take 0.4 mg by mouth at bedtime.       Allergies: No Known Allergies  Past Medical History, Surgical history, Social history, and Family History were reviewed and updated.  Review of Systems: All other 10 point review of systems is negative.   Physical Exam:  height is _0  (1.727 m) and weight is 158 lb (71.7 kg). His oral temperature is 98.2 F (36.8 C). His blood pressure is 108/68 and his pulse is 102 (abnormal). His respiration is 18 and oxygen saturation is 98%.   Wt Readings from Last 3 Encounters:  12/09/20 158 lb (71.7 kg)  12/03/20 162 lb (73.5 kg)  11/03/20 169 lb (76.7 kg)    Ocular: Sclerae unicteric, pupils equal, round and reactive to light Ear-nose-throat: Oropharynx clear, dentition fair Lymphatic: No cervical or supraclavicular adenopathy Lungs no rales or rhonchi, good excursion bilaterally Heart regular rate and rhythm, no murmur appreciated Abd soft, nontender, positive bowel sounds MSK no focal spinal tenderness, no joint edema Neuro: non-focal, well-oriented, appropriate affect Breasts: Deferred   Lab Results  Component Value Date   WBC 7.6 12/09/2020   HGB 12.8 (L) 12/09/2020   HCT 37.4 (L) 12/09/2020   MCV 91.2 12/09/2020   PLT 302 12/09/2020   No results found for: FERRITIN, IRON, TIBC, UIBC, IRONPCTSAT Lab Results  Component Value Date   RBC 4.10 (L) 12/09/2020   Lab Results  Component Value Date   KPAFRELGTCHN 8.2 11/03/2020   LAMBDASER 2.9 (L) 11/03/2020   KAPLAMBRATIO 2.83 (H) 11/03/2020   Lab Results  Component Value Date   IGGSERUM 317 (L) 11/03/2020   IGA 39 (L) 11/03/2020   IGMSERUM <5 (L) 11/03/2020   Lab  Results  Component Value Date   TOTALPROTELP 5.5 (L) 11/03/2020   ALBUMINELP 2.9 11/03/2020   A1GS 0.4 11/03/2020   A2GS 1.3 (H) 11/03/2020   BETS 0.7 11/03/2020   GAMS 0.2 (L) 11/03/2020   MSPIKE 0.1 (H) 11/03/2020     Chemistry      Component Value Date/Time   NA 132 (L) 12/09/2020 1054   K 4.1 12/09/2020 1054   CL 101 12/09/2020 1054   CO2 22 12/09/2020 1054   BUN 26 (H) 12/09/2020 1054   CREATININE 0.83 12/09/2020 1054   CREATININE 0.93 12/04/2020 1255      Component Value Date/Time   CALCIUM 8.9 12/09/2020 1054   ALKPHOS 91 12/09/2020 1054   AST 20 12/09/2020 1054  AST 18 12/04/2020 1255   ALT 62 (H) 12/09/2020 1054   ALT 34 12/04/2020 1255   BILITOT 0.6 12/09/2020 1054   BILITOT 0.5 12/04/2020 1255       Impression and Plan: Mr. Crewe is a very pleasant 70 yo caucasian gentleman with kappa light chain myeloma. Goal is transplant with [facility].  His sodium and hydration are much better today.  He is still having some nausea at times and poor appetite. Weight loss of 4 lbs since last week.  We will go ahead and give him fluids and Zofran today.  Follow-up in 2 weeks with weekly lab and fluid appointment incase he needs hydration.  He was encouraged to contact our office with any questions or concerns.   Laverna Peace, NP 2/9/20223:09 PM

## 2020-12-10 ENCOUNTER — Inpatient Hospital Stay: Payer: BC Managed Care – PPO | Admitting: Family

## 2020-12-10 ENCOUNTER — Inpatient Hospital Stay: Payer: BC Managed Care – PPO

## 2020-12-10 NOTE — Progress Notes (Signed)
Patient is actively working with [facility] for Transplant planning.   Oncology Nurse Navigator Documentation  Oncology Nurse Navigator Flowsheets 12/09/2020  Abnormal Finding Date -  Confirmed Diagnosis Date -  Diagnosis Status -  Planned Course of Treatment -  Phase of Treatment -  Chemotherapy Actual Start Date: -  Radiation Actual Start Date: -  Radiation Expected End Date: -  Radiation Actual End Date: -  Navigator Follow Up Date: 12/22/2020  Navigator Follow Up Reason: Appointment Review  Navigator Location [facility]  Navigator Encounter Type Appt/Treatment Plan Review;Treatment  Telephone -  Patient Visit Type MedOnc  Treatment Phase Active Tx  Barriers/Navigation Needs Coordination of Care;Education;Family Concerns  Education -  Interventions None Required  Acuity Level 2-Minimal Needs (1-2 Barriers Identified)  Referrals -  Coordination of Care -  Education Method -  Support Groups/Services Friends and Family  Time Spent with Patient 15

## 2020-12-11 ENCOUNTER — Other Ambulatory Visit: Payer: Self-pay

## 2020-12-11 ENCOUNTER — Encounter: Payer: BC Managed Care – PPO | Attending: Physician Assistant | Admitting: Physician Assistant

## 2020-12-11 DIAGNOSIS — C9001 Multiple myeloma in remission: Secondary | ICD-10-CM | POA: Insufficient documentation

## 2020-12-11 DIAGNOSIS — L89153 Pressure ulcer of sacral region, stage 3: Secondary | ICD-10-CM | POA: Diagnosis not present

## 2020-12-11 NOTE — Progress Notes (Signed)
[place], [place] ([id number]) Visit Report for 12/11/2020 Abuse/Suicide Risk Screen Details Patient Name: Bruce Smith, Bruce Smith Date of Service: 12/11/2020 2:45 PM Medical Record Number: [id number] Patient Account Number: [id number] Date of Birth/Sex: [birth year] (70 y.o. Male) Treating RN: Dolan Amen Primary Care Kama Cammarano: Gwenyth Allegra Other Clinician: Referring Albertus Chiarelli: Burney Gauze Treating Aaro Meyers/Extender: Skipper Cliche in Treatment: 0 Abuse/Suicide Risk Screen Items Answer ABUSE RISK SCREEN: Has anyone close to you tried to hurt or harm you recentlyo No Do you feel uncomfortable with anyone in your familyo No Has anyone forced you do things that you didnot want to doo No Electronic Signature(s) Signed: 12/11/2020 4:22:29 PM By: Georges Mouse, Minus Breeding RN Entered By: Georges Mouse, Minus Breeding on 12/11/2020 14:31:11 Bernadene Bell, [place] ([id number]) -------------------------------------------------------------------------------- Activities of Daily Living Details Patient Name: Bruce Smith Date of Service: 12/11/2020 2:45 PM Medical Record Number: [id number] Patient Account Number: [id number] Date of Birth/Sex: 06/19/1951 (70 y.o. Male) Treating RN: Dolan Amen Primary Care Leobardo Granlund: Gwenyth Allegra Other Clinician: Referring Charis Juliana: Burney Gauze Treating Bacilio Abascal/Extender: Skipper Cliche in Treatment: 0 Activities of Daily Living Items Answer Activities of Daily Living (Please select one for each item) Drive Automobile Not Able Take Medications Need Assistance Use Telephone Need Assistance Care for Appearance Need Assistance Use Toilet Need Assistance Bath / Shower Need Assistance Dress Self Need Assistance Feed Self Need Assistance Walk Need Assistance Get In / Out Bed Need Assistance Housework Need Assistance Prepare Meals Need Assistance Handle Money Completely Able Shop for Self Need Assistance Electronic Signature(s) Signed: 12/11/2020 4:22:29 PM By: Georges Mouse,  Minus Breeding RN Entered By: Georges Mouse, [place] on 12/11/2020 14:32:10 Bernadene Bell, [place] ([id number]) -------------------------------------------------------------------------------- Education Screening Details Patient Name: Bruce Smith Date of Service: 12/11/2020 2:45 PM Medical Record Number: [id number] Patient Account Number: [id number] Date of Birth/Sex: 1951-01-21 (69 y.o. Male) Treating RN: Dolan Amen Primary Care Sharonlee Nine: Gwenyth Allegra Other Clinician: Referring Avan Gullett: Burney Gauze Treating Jodie Cavey/Extender: Skipper Cliche in Treatment: 0 Primary Learner Assessed: Patient Learning Preferences/Education Level/Primary Language Learning Preference: Explanation, Demonstration Highest Education Level: High School Preferred Language: English Cognitive Barrier Language Barrier: No Translator Needed: No Memory Deficit: No Emotional Barrier: No Cultural/Religious Beliefs Affecting Medical Care: No Physical Barrier Impaired Vision: No Impaired Hearing: No Decreased Hand dexterity: No Knowledge/Comprehension Knowledge Level: Medium Comprehension Level: Medium Ability to understand written instructions: Medium Ability to understand verbal instructions: Medium Motivation Anxiety Level: Calm Cooperation: Cooperative Education Importance: Acknowledges Need Interest in Health Problems: Asks Questions Perception: Coherent Willingness to Engage in Self-Management High Activities: Readiness to Engage in Self-Management High Activities: Electronic Signature(s) Signed: 12/11/2020 4:22:29 PM By: Georges Mouse, Minus Breeding RN Entered By: Georges Mouse, Minus Breeding on 12/11/2020 14:32:35 Bernadene Bell, [place] ([id number]) -------------------------------------------------------------------------------- Fall Risk Assessment Details Patient Name: Bruce Smith Date of Service: 12/11/2020 2:45 PM Medical Record Number: [id number] Patient Account Number: [id number] Date of Birth/Sex: 12-28-1950 (69 y.o.  Male) Treating RN: Dolan Amen Primary Care Trung Wenzl: Gwenyth Allegra Other Clinician: Referring Aundrey Elahi: Burney Gauze Treating Layli Capshaw/Extender: Skipper Cliche in Treatment: 0 Fall Risk Assessment Items Have you had 2 or more falls in the last 12 monthso 0 Yes Have you had any fall that resulted in injury in the last 12 monthso 0 Yes FALLS RISK SCREEN History of falling - immediate or within 3 months 25 Yes Secondary diagnosis (Do you have 2 or more medical diagnoseso) 15 Yes Ambulatory aid None/bed rest/wheelchair/nurse 0 Yes Crutches/cane/walker 0 No Furniture 0 No Intravenous therapy Access/Saline/Heparin Lock 0 No Gait/Transferring Normal/ bed rest/ wheelchair 0 Yes Weak (short steps with or without  shuffle, stooped but able to lift head while walking, may 0 No seek support from furniture) Impaired (short steps with shuffle, may have difficulty arising from chair, head down, impaired 0 No balance) Mental Status Oriented to own ability 0 Yes Electronic Signature(s) Signed: 12/11/2020 4:22:29 PM By: Georges Mouse, Minus Breeding RN Entered By: Georges Mouse, Kenia on 12/11/2020 14:33:01 [place], [place] ([id number]) -------------------------------------------------------------------------------- Foot Assessment Details Patient Name: Bruce Smith Date of Service: 12/11/2020 2:45 PM Medical Record Number: [id number] Patient Account Number: [id number] Date of Birth/Sex: Aug 06, 1951 (70 y.o. Male) Treating RN: Dolan Amen Primary Care Lis Savitt: Gwenyth Allegra Other Clinician: Referring Amyia Lodwick: Burney Gauze Treating Camry Robello/Extender: Skipper Cliche in Treatment: 0 Foot Assessment Items Site Locations + = Sensation present, - = Sensation absent, C = Callus, U = Ulcer R = Redness, W = Warmth, M = Maceration, PU = Pre-ulcerative lesion F = Fissure, S = Swelling, D = Dryness Assessment Right: Left: Other Deformity: No No Prior Foot Ulcer: No No Prior Amputation: No  No Charcot Joint: No No Ambulatory Status: Non-ambulatory Assistance Device: Wheelchair Gait: Electronic Signature(s) Signed: 12/11/2020 4:22:29 PM By: Georges Mouse, Minus Breeding RN Entered By: Georges Mouse, Minus Breeding on 12/11/2020 14:34:07 [place], [place] ([id number]) -------------------------------------------------------------------------------- Nutrition Risk Screening Details Patient Name: Bruce Smith Date of Service: 12/11/2020 2:45 PM Medical Record Number: [id number] Patient Account Number: [id number] Date of Birth/Sex: September 22, 1951 (70 y.o. Male) Treating RN: Dolan Amen Primary Care Izzabella Besse: Gwenyth Allegra Other Clinician: Referring Ridhima Golberg: Burney Gauze Treating Dyami Umbach/Extender: Jeri Cos Weeks in Treatment: 0 Height (in): 68 Weight (lbs): 160 Body Mass Index (BMI): 24.3 Nutrition Risk Screening Items Score Screening NUTRITION RISK SCREEN: I have an illness or condition that made me change the kind and/or amount of food I eat 0 No I eat fewer than two meals per day 3 Yes I eat few fruits and vegetables, or milk products 2 Yes I have three or more drinks of beer, liquor or wine almost every day 0 No I have tooth or mouth problems that make it hard for me to eat 0 No I don't always have enough money to buy the food I need 0 No I eat alone most of the time 0 No I take three or more different prescribed or over-the-counter drugs a day 1 Yes Without wanting to, I have lost or gained 10 pounds in the last six months 0 No I am not always physically able to shop, cook and/or feed myself 2 Yes Nutrition Protocols Good Risk Protocol Moderate Risk Protocol High Risk Proctocol 0 Provide education on nutrition Risk Level: High Risk Score: 8 Electronic Signature(s) Signed: 12/11/2020 4:22:29 PM By: Georges Mouse, Minus Breeding RN Entered By: Georges Mouse, Minus Breeding on 12/11/2020 14:33:34

## 2020-12-14 ENCOUNTER — Inpatient Hospital Stay: Payer: BC Managed Care – PPO

## 2020-12-14 ENCOUNTER — Other Ambulatory Visit: Payer: Self-pay

## 2020-12-14 VITALS — BP 146/68 | HR 88 | Temp 97.7°F | Resp 16

## 2020-12-14 DIAGNOSIS — C9000 Multiple myeloma not having achieved remission: Secondary | ICD-10-CM

## 2020-12-14 DIAGNOSIS — E860 Dehydration: Secondary | ICD-10-CM

## 2020-12-14 LAB — CBC WITH DIFFERENTIAL (CANCER CENTER ONLY)
Abs Immature Granulocytes: 4.95 10*3/uL — ABNORMAL HIGH (ref 0.00–0.07)
Basophils Absolute: 0.1 10*3/uL (ref 0.0–0.1)
Basophils Relative: 0 %
Eosinophils Absolute: 0 10*3/uL (ref 0.0–0.5)
Eosinophils Relative: 0 %
HCT: 38.6 % — ABNORMAL LOW (ref 39.0–52.0)
Hemoglobin: 13 g/dL (ref 13.0–17.0)
Immature Granulocytes: 17 %
Lymphocytes Relative: 2 %
Lymphs Abs: 0.5 10*3/uL — ABNORMAL LOW (ref 0.7–4.0)
MCH: 31.5 pg (ref 26.0–34.0)
MCHC: 33.7 g/dL (ref 30.0–36.0)
MCV: 93.5 fL (ref 80.0–100.0)
Monocytes Absolute: 1.7 10*3/uL — ABNORMAL HIGH (ref 0.1–1.0)
Monocytes Relative: 6 %
Neutro Abs: 22 10*3/uL — ABNORMAL HIGH (ref 1.7–7.7)
Neutrophils Relative %: 75 %
Platelet Count: 209 10*3/uL (ref 150–400)
RBC: 4.13 MIL/uL — ABNORMAL LOW (ref 4.22–5.81)
RDW: 15.8 % — ABNORMAL HIGH (ref 11.5–15.5)
WBC Count: 29.2 10*3/uL — ABNORMAL HIGH (ref 4.0–10.5)
nRBC: 0 % (ref 0.0–0.2)

## 2020-12-14 LAB — CMP (CANCER CENTER ONLY)
ALT: 21 U/L (ref 0–44)
AST: 9 U/L — ABNORMAL LOW (ref 15–41)
Albumin: 3.8 g/dL (ref 3.5–5.0)
Alkaline Phosphatase: 124 U/L (ref 38–126)
Anion gap: 9 (ref 5–15)
BUN: 17 mg/dL (ref 8–23)
CO2: 23 mmol/L (ref 22–32)
Calcium: 8.9 mg/dL (ref 8.9–10.3)
Chloride: 100 mmol/L (ref 98–111)
Creatinine: 0.79 mg/dL (ref 0.61–1.24)
GFR, Estimated: >60 mL/min (ref >60)
Glucose, Bld: 104 mg/dL — ABNORMAL HIGH (ref 70–99)
Potassium: 4.8 mmol/L (ref 3.5–5.1)
Sodium: 132 mmol/L — ABNORMAL LOW (ref 135–145)
Total Bilirubin: 0.5 mg/dL (ref 0.3–1.2)
Total Protein: 5.3 g/dL — ABNORMAL LOW (ref 6.5–8.1)

## 2020-12-14 MED ORDER — SODIUM CHLORIDE 0.9 % IV SOLN
Freq: Once | INTRAVENOUS | Status: AC
  Filled 2020-12-14: qty 250

## 2020-12-14 NOTE — Patient Instructions (Signed)
Dehydration, Adult Dehydration is condition in which there is not enough water or other fluids in the body. This happens when a person loses more fluids than he or she takes in. Important body parts cannot work right without the right amount of fluids. Any loss of fluids from the body can cause dehydration. Dehydration can be mild, worse, or very bad. It should be treated right away to keep it from getting very bad. What are the causes? This condition may be caused by:  Conditions that cause loss of water or other fluids, such as: ? Watery poop (diarrhea). ? Vomiting. ? Sweating a lot. ? Peeing (urinating) a lot.  Not drinking enough fluids, especially when you: ? Are ill. ? Are doing things that take a lot of energy to do.  Other illnesses and conditions, such as fever or infection.  Certain medicines, such as medicines that take extra fluid out of the body (diuretics).  Lack of safe drinking water.  Not being able to get enough water and food. What increases the risk? The following factors may make you more likely to develop this condition:  Having a long-term (chronic) illness that has not been treated the right way, such as: ? Diabetes. ? Heart disease. ? Kidney disease.  Being 65 years of age or older.  Having a disability.  Living in a place that is high above the ground or sea (high in altitude). The thinner, dried air causes more fluid loss.  Doing exercises that put stress on your body for a long time. What are the signs or symptoms? Symptoms of dehydration depend on how bad it is. Mild or worse dehydration  Thirst.  Dry lips or dry mouth.  Feeling dizzy or light-headed, especially when you stand up from sitting.  Muscle cramps.  Your body making: ? Dark pee (urine). Pee may be the color of tea. ? Less pee than normal. ? Less tears than normal.  Headache. Very bad dehydration  Changes in skin. Skin may: ? Be cold to the touch (clammy). ? Be blotchy  or pale. ? Not go back to normal right after you lightly pinch it and let it go.  Little or no tears, pee, or sweat.  Changes in vital signs, such as: ? Fast breathing. ? Low blood pressure. ? Weak pulse. ? Pulse that is more than 100 beats a minute when you are sitting still.  Other changes, such as: ? Feeling very thirsty. ? Eyes that look hollow (sunken). ? Cold hands and feet. ? Being mixed up (confused). ? Being very tired (lethargic) or having trouble waking from sleep. ? Short-term weight loss. ? Loss of consciousness. How is this treated? Treatment for this condition depends on how bad it is. Treatment should start right away. Do not wait until your condition gets very bad. Very bad dehydration is an emergency. You will need to go to a hospital.  Mild or worse dehydration can be treated at home. You may be asked to: ? Drink more fluids. ? Drink an oral rehydration solution (ORS). This drink helps get the right amounts of fluids and salts and minerals in the blood (electrolytes).  Very bad dehydration can be treated: ? With fluids through an IV tube. ? By getting normal levels of salts and minerals in your blood. This is often done by giving salts and minerals through a tube. The tube is passed through your nose and into your stomach. ? By treating the root cause. Follow these instructions at   home: Oral rehydration solution If told by your doctor, drink an ORS:  Make an ORS. Use instructions on the package.  Start by drinking small amounts, about  cup (120 mL) every 5-10 minutes.  Slowly drink more until you have had the amount that your doctor said to have. Eating and drinking  Drink enough clear fluid to keep your pee pale yellow. If you were told to drink an ORS, finish the ORS first. Then, start slowly drinking other clear fluids. Drink fluids such as: ? Water. Do not drink only water. Doing that can make the salt (sodium) level in your body get too low. ? Water  from ice chips you suck on. ? Fruit juice that you have added water to (diluted). ? Low-calorie sports drinks.  Eat foods that have the right amounts of salts and minerals, such as: ? Bananas. ? Oranges. ? Potatoes. ? Tomatoes. ? Spinach.  Do not drink alcohol.  Avoid: ? Drinks that have a lot of sugar. These include:  High-calorie sports drinks.  Fruit juice that you did not add water to.  Soda.  Caffeine. ? Foods that are greasy or have a lot of fat or sugar.         General instructions  Take over-the-counter and prescription medicines only as told by your doctor.  Do not take salt tablets. Doing that can make the salt level in your body get too high.  Return to your normal activities as told by your doctor. Ask your doctor what activities are safe for you.  Keep all follow-up visits as told by your doctor. This is important. Contact a doctor if:  You have pain in your belly (abdomen) and the pain: ? Gets worse. ? Stays in one place.  You have a rash.  You have a stiff neck.  You get angry or annoyed (irritable) more easily than normal.  You are more tired or have a harder time waking than normal.  You feel: ? Weak or dizzy. ? Very thirsty. Get help right away if you have:  Any symptoms of very bad dehydration.  Symptoms of vomiting, such as: ? You cannot eat or drink without vomiting. ? Your vomiting gets worse or does not go away. ? Your vomit has blood or green stuff in it.  Symptoms that get worse with treatment.  A fever.  A very bad headache.  Problems with peeing or pooping (having a bowel movement), such as: ? Watery poop that gets worse or does not go away. ? Blood in your poop (stool). This may cause poop to look black and tarry. ? Not peeing in 6-8 hours. ? Peeing only a small amount of very dark pee in 6-8 hours.  Trouble breathing. These symptoms may be an emergency. Do not wait to see if the symptoms will go away. Get  medical help right away. Call your local emergency services (911 in the [place] Do not drive yourself to the hospital. Summary  Dehydration is a condition in which there is not enough water or other fluids in the body. This happens when a person loses more fluids than he or she takes in.  Treatment for this condition depends on how bad it is. Treatment should be started right away. Do not wait until your condition gets very bad.  Drink enough clear fluid to keep your pee pale yellow. If you were told to drink an oral rehydration solution (ORS), finish the ORS first. Then, start slowly drinking other clear fluids.    Take over-the-counter and prescription medicines only as told by your doctor.  Get help right away if you have any symptoms of very bad dehydration. This information is not intended to replace advice given to you by your health care provider. Make sure you discuss any questions you have with your health care provider. Document Revised: 05/30/2019 Document Reviewed: 05/30/2019 Elsevier Patient Education  2021 Elsevier Inc.  

## 2020-12-14 NOTE — Progress Notes (Signed)
Smith, Sires Bruce ([id number]) Visit Report for 12/11/2020 Chief Complaint Document Details Patient Name: Bruce Smith, Bruce Smith Date of Service: 12/11/2020 2:45 PM Medical Record Number: [id number] Patient Account Number: [id number] Date of Birth/Sex: 1951/04/13 (70 y.o. Male) Treating RN: Carlene Coria Primary Care Provider: Gwenyth Allegra Other Clinician: Referring Provider: Burney Gauze Treating Provider/Extender: Skipper Cliche in Treatment: 0 Information Obtained from: Patient Chief Complaint Sacral pressure ulcer Electronic Signature(s) Signed: 12/11/2020 2:56:32 PM By: Bruce Smith Keeler PA-C Previous Signature: 12/11/2020 2:56:20 PM Version By: Bruce Smith Keeler PA-C Entered By: Bruce Smith Keeler on 12/11/2020 14:56:32 [place], [place] ([id number]) -------------------------------------------------------------------------------- Debridement Details Patient Name: Bruce Smith Date of Service: 12/11/2020 2:45 PM Medical Record Number: [id number] Patient Account Number: [id number] Date of Birth/Sex: [birth year] (70 y.o. Male) Treating RN: Carlene Coria Primary Care Provider: Gwenyth Allegra Other Clinician: Referring Provider: Burney Gauze Treating Provider/Extender: Skipper Cliche in Treatment: 0 Debridement Performed for Wound #1 Midline Sacrum Assessment: Performed By: Physician Tommie Sams., PA-C Debridement Type: Debridement Level of Consciousness (Pre- Awake and Alert procedure): Pre-procedure Verification/Time Out Yes - 15:05 Taken: Start Time: 15:05 Pain Control: Lidocaine 4% Topical Solution Total Area Debrided (L x W): 1.2 (cm) x 0.5 (cm) = 0.6 (cm) Tissue and other material Viable, Non-Viable, Slough, Subcutaneous, Skin: Dermis , Skin: Epidermis, Slough debrided: Level: Skin/Subcutaneous Tissue Debridement Description: Excisional Instrument: Curette Bleeding: Moderate Hemostasis Achieved: Pressure End Time: 15:08 Procedural Pain: 0 Post Procedural Pain: 0 Response to  Treatment: Procedure was tolerated well Level of Consciousness (Post- Awake and Alert procedure): Post Debridement Measurements of Total Wound Length: (cm) 1.2 Stage: Category/Stage III Width: (cm) 0.5 Depth: (cm) 0.2 Volume: (cm) 0.094 Character of Wound/Ulcer Post Debridement: Improved Post Procedure Diagnosis Same as Pre-procedure Electronic Signature(s) Signed: 12/11/2020 4:44:17 PM By: Bruce Smith Keeler PA-C Signed: 12/14/2020 4:08:54 PM By: Carlene Coria RN Entered By: Carlene Coria on 12/11/2020 15:07:25 [place], Bren ([id number]) -------------------------------------------------------------------------------- HPI Details Patient Name: Bruce Smith Date of Service: 12/11/2020 2:45 PM Medical Record Number: [id number] Patient Account Number: [id number] Date of Birth/Sex: [birth year] (70 y.o. Male) Treating RN: Carlene Coria Primary Care Provider: Gwenyth Allegra Other Clinician: Referring Provider: Burney Gauze Treating Provider/Extender: Skipper Cliche in Treatment: 0 History of Present Illness HPI Description: 12/11/2020 upon evaluation today patient appears to be doing okay though he does have a wound in the sacral region which is a stage III pressure ulcer that appears to have some slough noted on the base of the wound. He is going require some debridement here today. With that being said I think that he may end up needing some medication of one sort or another to try to help clean this up as well. I discussed that with him today but nonetheless we are going to proceed with some debridement. He does have multiple myeloma and subsequently this is currently still being treated to some degree at least he is scheduled to have a bone marrow transplant shortly and then we will not be coming out for a while in order to try to isolate as much as possible to protect his immune system. He does have some generalized weakness which from him being very sick that is what ended with him actually  having this wound in the sacral region when he was not able to get up and move as easily as he normally had. Fortunately there is no signs of active infection at this time. No fevers, chills, nausea, vomiting, or diarrhea. Electronic Signature(s) Signed: 12/11/2020 3:59:16 PM By: Bruce Smith Keeler PA-C Entered  By: Bruce Smith Keeler on 12/11/2020 15:59:16 [place], [place] ([id number]) -------------------------------------------------------------------------------- Physical Exam Details Patient Name: Smith, Bruce Date of Service: 12/11/2020 2:45 PM Medical Record Number: [id number] Patient Account Number: [id number] Date of Birth/Sex: [birth year] (70 y.o. Male) Treating RN: Carlene Coria Primary Care Provider: Gwenyth Allegra Other Clinician: Referring Provider: Burney Gauze Treating Provider/Extender: Skipper Cliche in Treatment: 0 Constitutional supine blood pressure is within target range for patient.. pulse regular and within target range for patient.Marland Kitchen respirations regular, non-labored and within target range for patient.Marland Kitchen temperature within target range for patient.. Well-nourished and well-hydrated in no acute distress. Eyes conjunctiva clear no eyelid edema noted. pupils equal round and reactive to light and accommodation. Ears, Nose, Mouth, and Throat no gross abnormality of ear auricles or external auditory canals. normal hearing noted during conversation. mucus membranes moist. Respiratory normal breathing without difficulty. Cardiovascular no clubbing, cyanosis, significant edema, <3 sec cap refill. Musculoskeletal Patient unable to walk without assistance. no significant deformity or arthritic changes, no loss or range of motion, no clubbing. Psychiatric this patient is able to make decisions and demonstrates good insight into disease process. Alert and Oriented x 3. pleasant and cooperative. Notes Upon inspection patient's wound bed actually showed some signs of slough buildup in the  surface of the wound. There does not appear to be any signs of active infection which is great news and overall very pleased with where that stands today at least. I did perform some sharp debridement to clear away some of the necrotic debris the patient tolerated that today without complication post debridement the wound bed appears to be doing better though not completely cleaned as of yet. I am going to suggest that we use some Iodoflex to try to help clean this up I do not want to be too moist and I think the Iodoflex is a good compromise. Electronic Signature(s) Signed: 12/11/2020 3:59:57 PM By: Bruce Smith Keeler PA-C Entered By: Bruce Smith Keeler on 12/11/2020 15:59:57 [place], [place] ([id number]) -------------------------------------------------------------------------------- Physician Orders Details Patient Name: Bruce Smith Date of Service: 12/11/2020 2:45 PM Medical Record Number: [id number] Patient Account Number: [id number] Date of Birth/Sex: 03-23-1951 (70 y.o. Male) Treating RN: Carlene Coria Primary Care Provider: Gwenyth Allegra Other Clinician: Referring Provider: Burney Gauze Treating Provider/Extender: Skipper Cliche in Treatment: 0 Verbal / Phone Orders: No Diagnosis Coding ICD-10 Coding Code Description L89.153 Pressure ulcer of sacral region, stage 3 C90.01 Multiple myeloma in remission M62.81 Muscle weakness (generalized) Follow-up Appointments o Return Appointment in 1 week. Off-Loading o Turn and reposition every 2 hours Wound Treatment Wound #1 - Sacrum Wound Laterality: Midline Cleanser: [place] Ancillary Kit - 15 Day Supply (DME) (Generic) Every Other Day/30 Days Discharge Instructions: Use supplies as instructed; Kit contains: (15) Saline Bullets; (15) 3x3 Gauze; 15 pr Gloves Cleanser: Normal Saline (DME) (Generic) Every Other Day/30 Days Discharge Instructions: Wash your hands with soap and water. Remove old dressing, discard into plastic bag and place into  trash. Cleanse the wound with Normal Saline prior to applying a clean dressing using gauze sponges, not tissues or cotton balls. Do not scrub or use excessive force. Pat dry using gauze sponges, not tissue or cotton balls. Primary Dressing: [place] Wound Dressing 6x6 (in/in) (DME) (Generic) Every Other Day/30 Days Primary Dressing: IODOFLEX 0.9% Cadexomer Iodine Pad (DME) (Generic) Every Other Day/30 Days Discharge Instructions: Apply Iodoflex to wound bed only as directed. Electronic Signature(s) Signed: 12/11/2020 4:44:17 PM By: Bruce Smith Keeler PA-C Signed: 12/14/2020 4:08:54 PM By: Carlene Coria  RN Entered By: Carlene Coria on 12/11/2020 15:10:12 Bernadene Bell, [place] ([id number]) -------------------------------------------------------------------------------- Problem List Details Patient Name: Bruce Smith Date of Service: 12/11/2020 2:45 PM Medical Record Number: [id number] Patient Account Number: [id number] Date of Birth/Sex: [birth year] (70 y.o. Male) Treating RN: Carlene Coria Primary Care Provider: Gwenyth Allegra Other Clinician: Referring Provider: Burney Gauze Treating Provider/Extender: Skipper Cliche in Treatment: 0 Active Problems ICD-10 Encounter Code Description Active Date MDM Diagnosis L89.153 Pressure ulcer of sacral region, stage 3 12/11/2020 No Yes C90.01 Multiple myeloma in remission 12/11/2020 No Yes M62.81 Muscle weakness (generalized) 12/11/2020 No Yes Inactive Problems Resolved Problems Electronic Signature(s) Signed: 12/11/2020 2:56:10 PM By: Bruce Smith Keeler PA-C Previous Signature: 12/11/2020 2:55:45 PM Version By: Bruce Smith Keeler PA-C Entered By: Bruce Smith Keeler on 12/11/2020 14:56:10 [place], [place] ([id number]) -------------------------------------------------------------------------------- Progress Note Details Patient Name: Bruce Smith Date of Service: 12/11/2020 2:45 PM Medical Record Number: [id number] Patient Account Number:  [id number] Date of Birth/Sex: 06-Apr-1951 (70 y.o. Male) Treating RN: Carlene Coria Primary Care Provider: Gwenyth Allegra Other Clinician: Referring Provider: Burney Gauze Treating Provider/Extender: Skipper Cliche in Treatment: 0 Subjective Chief Complaint Information obtained from Patient Sacral pressure ulcer History of Present Illness (HPI) 12/11/2020 upon evaluation today patient appears to be doing okay though he does have a wound in the sacral region which is a stage III pressure ulcer that appears to have some slough noted on the base of the wound. He is going require some debridement here today. With that being said I think that he may end up needing some medication of one sort or another to try to help clean this up as well. I discussed that with him today but nonetheless we are going to proceed with some debridement. He does have multiple myeloma and subsequently this is currently still being treated to some degree at least he is scheduled to have a bone marrow transplant shortly and then we will not be coming out for a while in order to try to isolate as much as possible to protect his immune system. He does have some generalized weakness which from him being very sick that is what ended with him actually having this wound in the sacral region when he was not able to get up and move as easily as he normally had. Fortunately there is no signs of active infection at this time. No fevers, chills, nausea, vomiting, or diarrhea. Patient History Information obtained from Patient. Allergies No Known Allergies Social History Former smoker, Marital Status - Married, Alcohol Use - Rarely, Drug Use - No History, Caffeine Use - Daily. Medical History Ear/Nose/Mouth/Throat Denies history of Chronic sinus problems/congestion, Middle ear problems Hematologic/Lymphatic Denies history of Anemia, Hemophilia, Human Immunodeficiency Virus, Lymphedema, Sickle Cell Disease Respiratory Denies  history of Aspiration, Asthma, Chronic Obstructive Pulmonary Disease (COPD), Pneumothorax, Sleep Apnea, Tuberculosis Cardiovascular Patient has history of Coronary Artery Disease, Hypertension, Myocardial Infarction Denies history of Angina, Arrhythmia, Congestive Heart Failure, Deep Vein Thrombosis, Hypotension, Peripheral Arterial Disease, Peripheral Venous Disease, Phlebitis, Vasculitis Gastrointestinal Denies history of Cirrhosis , Colitis, Crohn s, Hepatitis A, Hepatitis B, Hepatitis C Endocrine Denies history of Type I Diabetes, Type II Diabetes Genitourinary Denies history of End Stage Renal Disease Immunological Denies history of Lupus Erythematosus, Raynaud s, Scleroderma Integumentary (Skin) Patient has history of History of pressure wounds Denies history of History of Burn Musculoskeletal Denies history of Gout, Rheumatoid Arthritis, Osteoarthritis, Osteomyelitis Neurologic Patient has history of Neuropathy Denies history of Dementia, Quadriplegia, Paraplegia, Seizure Disorder Oncologic Patient has history of Received  Chemotherapy - multiple myeoma, Received Radiation - multiple myeoma Psychiatric Denies history of Anorexia/bulimia, Confinement Anxiety Review of Systems (ROS) Constitutional Symptoms (General Health) Denies complaints or symptoms of Fatigue, Fever, Chills, Marked Weight Change. Ear/Nose/Mouth/Throat Denies complaints or symptoms of Difficult clearing ears, Sinusitis. Hematologic/Lymphatic Denies complaints or symptoms of Bleeding / Clotting Disorders, Human Immunodeficiency Virus. Respiratory Onder, [place] ([id number]) Denies complaints or symptoms of Chronic or frequent coughs, Shortness of Breath. Cardiovascular Denies complaints or symptoms of Chest pain, LE edema. Gastrointestinal Denies complaints or symptoms of Frequent diarrhea, Nausea, Vomiting. Endocrine Denies complaints or symptoms of Hepatitis, Thyroid disease, Polydypsia (Excessive  Thirst). Genitourinary Denies complaints or symptoms of Kidney failure/ Dialysis, Incontinence/dribbling. Immunological Denies complaints or symptoms of Hives, Itching. Integumentary (Skin) Complains or has symptoms of Wounds, Breakdown. Denies complaints or symptoms of Bleeding or bruising tendency, Swelling. Musculoskeletal Denies complaints or symptoms of Muscle Pain, Muscle Weakness. Neurologic Denies complaints or symptoms of Numbness/parasthesias, Focal/Weakness. Psychiatric Denies complaints or symptoms of Anxiety, Claustrophobia. Objective Constitutional supine blood pressure is within target range for patient.. pulse regular and within target range for patient.Marland Kitchen respirations regular, non-labored and within target range for patient.Marland Kitchen temperature within target range for patient.. Well-nourished and well-hydrated in no acute distress. Vitals Time Taken: 2:24 PM, Height: 68 in, Source: Stated, Weight: 160 lbs, Source: Stated, BMI: 24.3, Temperature: 98.2 F, Pulse: 88 bpm, Respiratory Rate: 16 breaths/min, Blood Pressure: 115/69 mmHg. Eyes conjunctiva clear no eyelid edema noted. pupils equal round and reactive to light and accommodation. Ears, Nose, Mouth, and Throat no gross abnormality of ear auricles or external auditory canals. normal hearing noted during conversation. mucus membranes moist. Respiratory normal breathing without difficulty. Cardiovascular no clubbing, cyanosis, significant edema, Musculoskeletal Patient unable to walk without assistance. no significant deformity or arthritic changes, no loss or range of motion, no clubbing. Psychiatric this patient is able to make decisions and demonstrates good insight into disease process. Alert and Oriented x 3. pleasant and cooperative. General Notes: Upon inspection patient's wound bed actually showed some signs of slough buildup in the surface of the wound. There does not appear to be any signs of active infection  which is great news and overall very pleased with where that stands today at least. I did perform some sharp debridement to clear away some of the necrotic debris the patient tolerated that today without complication post debridement the wound bed appears to be doing better though not completely cleaned as of yet. I am going to suggest that we use some Iodoflex to try to help clean this up I do not want to be too moist and I think the Iodoflex is a good compromise. Integumentary (Hair, Skin) Wound #1 status is Open. Original cause of wound was Pressure Injury. The wound is located on the Midline Sacrum. The wound measures 1.2cm length x 0.5cm width x 0.2cm depth; 0.471cm^2 area and 0.094cm^3 volume. There is no tunneling or undermining noted. There is a medium amount of serosanguineous drainage noted. The wound margin is flat and intact. There is no granulation within the wound bed. There is a large (67-100%) amount of necrotic tissue within the wound bed including Adherent Slough. Assessment Active Problems ICD-10 BORMAN, Psalm ([id number]) Pressure ulcer of sacral region, stage 3 Multiple myeloma in remission Muscle weakness (generalized) Procedures Wound #1 Pre-procedure diagnosis of Wound #1 is a Pressure Ulcer located on the Midline Sacrum . There was a Excisional Skin/Subcutaneous Tissue Debridement with a total area of 0.6 sq cm performed by Tommie Sams.,  PA-C. With the following instrument(s): Curette to remove Viable and Non-Viable tissue/material. Material removed includes Subcutaneous Tissue, Slough, Skin: Dermis, and Skin: Epidermis after achieving pain control using Lidocaine 4% Topical Solution. No specimens were taken. A time out was conducted at 15:05, prior to the start of the procedure. A Moderate amount of bleeding was controlled with Pressure. The procedure was tolerated well with a pain level of 0 throughout and a pain level of 0 following the procedure. Post Debridement  Measurements: 1.2cm length x 0.5cm width x 0.2cm depth; 0.094cm^3 volume. Post debridement Stage noted as Category/Stage III. Character of Wound/Ulcer Post Debridement is improved. Post procedure Diagnosis Wound #1: Same as Pre-Procedure Plan Follow-up Appointments: Return Appointment in 1 week. Off-Loading: Turn and reposition every 2 hours WOUND #1: - Sacrum Wound Laterality: Midline Cleanser: [employer] Ancillary Kit - 15 Day Supply (DME) (Generic) Every Other Day/30 Days Discharge Instructions: Use supplies as instructed; Kit contains: (15) Saline Bullets; (15) 3x3 Gauze; 15 pr Gloves Cleanser: Normal Saline (DME) (Generic) Every Other Day/30 Days Discharge Instructions: Wash your hands with soap and water. Remove old dressing, discard into plastic bag and place into trash. Cleanse the wound with Normal Saline prior to applying a clean dressing using gauze sponges, not tissues or cotton balls. Do not scrub or use excessive force. Pat dry using gauze sponges, not tissue or cotton balls. Primary Dressing: [place] Wound Dressing 6x6 (in/in) (DME) (Generic) Every Other Day/30 Days Primary Dressing: IODOFLEX 0.9% Cadexomer Iodine Pad (DME) (Generic) Every Other Day/30 Days Discharge Instructions: Apply Iodoflex to wound bed only as directed. 1. Would recommend currently that we go ahead and initiate a continuation of treatment with the Iodoflex I think this will be good over the next several weeks. Especially following his surgery I think this would be a good way to go to try to keep the area clean and moving in the right direction. I am afraid Santyl may be too moist for the wound. 2. I am also can recommend at this time that we have the patient go ahead and continue with appropriate offloading he does have a gel cushion for his chair and again is trying to stay off of it when he is sleeping as well. He does tell me when he sits on it it hurts anyway that is another reason  to try to keep pressure off. 3. The patient will be having treatment for his multiple myeloma in fact he is having a bone marrow transplant. Obviously following this time he is getting need to be very careful and will be staying at home not going out for appointments and that is good to be for at least the next 3 weeks. We will see patient back for reevaluation in 1 week here in the clinic. If anything worsens or changes patient will contact our office for additional recommendations. Electronic Signature(s) Signed: 12/11/2020 4:01:03 PM By: Bruce Smith Keeler PA-C Entered By: Bruce Smith Keeler on 12/11/2020 16:01:03 [place], [place] ([id number]) -------------------------------------------------------------------------------- ROS/PFSH Details Patient Name: Bruce Smith Date of Service: 12/11/2020 2:45 PM Medical Record Number: [id number] Patient Account Number: [id number] Date of Birth/Sex: 01-16-1951 (69 y.o. Male) Treating RN: Dolan Amen Primary Care Provider: Gwenyth Allegra Other Clinician: Referring Provider: Burney Gauze Treating Provider/Extender: Skipper Cliche in Treatment: 0 Information Obtained From Patient Constitutional Symptoms (General Health) Complaints and Symptoms: Negative for: Fatigue; Fever; Chills; Marked Weight Change Ear/Nose/Mouth/Throat Complaints and Symptoms: Negative for: Difficult clearing ears; Sinusitis Medical History: Negative for: Chronic sinus problems/congestion;  Middle ear problems Hematologic/Lymphatic Complaints and Symptoms: Negative for: Bleeding / Clotting Disorders; Human Immunodeficiency Virus Medical History: Negative for: Anemia; Hemophilia; Human Immunodeficiency Virus; Lymphedema; Sickle Cell Disease Respiratory Complaints and Symptoms: Negative for: Chronic or frequent coughs; Shortness of Breath Medical History: Negative for: Aspiration; Asthma; Chronic Obstructive Pulmonary Disease (COPD); Pneumothorax; Sleep Apnea;  Tuberculosis Cardiovascular Complaints and Symptoms: Negative for: Chest pain; LE edema Medical History: Positive for: Coronary Artery Disease; Hypertension; Myocardial Infarction Negative for: Angina; Arrhythmia; Congestive Heart Failure; Deep Vein Thrombosis; Hypotension; Peripheral Arterial Disease; Peripheral Venous Disease; Phlebitis; Vasculitis Gastrointestinal Complaints and Symptoms: Negative for: Frequent diarrhea; Nausea; Vomiting Medical History: Negative for: Cirrhosis ; Colitis; Crohnos; Hepatitis A; Hepatitis B; Hepatitis C Endocrine Complaints and Symptoms: Negative for: Hepatitis; Thyroid disease; Polydypsia (Excessive Thirst) Medical History: Negative for: Type I Diabetes; Type II Diabetes Genitourinary [place], [place] ([id number]) Complaints and Symptoms: Negative for: Kidney failure/ Dialysis; Incontinence/dribbling Medical History: Negative for: End Stage Renal Disease Immunological Complaints and Symptoms: Negative for: Hives; Itching Medical History: Negative for: Lupus Erythematosus; Raynaudos; Scleroderma Integumentary (Skin) Complaints and Symptoms: Positive for: Wounds; Breakdown Negative for: Bleeding or bruising tendency; Swelling Medical History: Positive for: History of pressure wounds Negative for: History of Burn Musculoskeletal Complaints and Symptoms: Negative for: Muscle Pain; Muscle Weakness Medical History: Negative for: Gout; Rheumatoid Arthritis; Osteoarthritis; Osteomyelitis Neurologic Complaints and Symptoms: Negative for: Numbness/parasthesias; Focal/Weakness Medical History: Positive for: Neuropathy Negative for: Dementia; Quadriplegia; Paraplegia; Seizure Disorder Psychiatric Complaints and Symptoms: Negative for: Anxiety; Claustrophobia Medical History: Negative for: Anorexia/bulimia; Confinement Anxiety Oncologic Medical History: Positive for: Received Chemotherapy - multiple myeoma; Received Radiation - multiple  myeoma Immunizations Pneumococcal Vaccine: Received Pneumococcal Vaccination: No Implantable Devices None Family and Social History Former smoker; Marital Status - Married; Alcohol Use: Rarely; Drug Use: No History; Caffeine Use: Daily Electronic Signature(s) Signed: 12/11/2020 4:22:29 PM By: Georges Mouse, Minus Breeding RN Signed: 12/11/2020 4:44:17 PM By: [place], [place] ([id number]) Entered By: Georges Mouse, Minus Breeding on 12/11/2020 14:31:06 Bernadene Bell, [place] ([id number]) -------------------------------------------------------------------------------- SuperBill Details Patient Name: Bruce Smith Date of Service: 12/11/2020 Medical Record Number: [id number] Patient Account Number: [id number] Date of Birth/Sex: [birth year] (70 y.o. 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 M62.81 Muscle weakness (generalized) Facility Procedures CPT4 Code: 84166063 Description: 99214 - WOUND CARE VISIT-LEV 4 EST PT Modifier: Quantity: 1 CPT4 Code: 01601093 Description: 11042 - DEB SUBQ TISSUE 20 SQ CM/< Modifier: Quantity: 1 CPT4 Code: Description: ICD-10 Diagnosis Description L89.153 Pressure ulcer of sacral region, stage 3 Modifier: Quantity: Physician Procedures CPT4 Code: 2355732 Description: WC PHYS LEVEL 3 o NEW PT Modifier: 25 Quantity: 1 CPT4 Code: Description: ICD-10 Diagnosis Description L89.153 Pressure ulcer of sacral region, stage 3 C90.01 Multiple myeloma in remission M62.81 Muscle weakness (generalized) Modifier: Quantity: CPT4 Code: 2025427 Description: 11042 - WC PHYS SUBQ TISS 20 SQ CM Modifier: Quantity: 1 CPT4 Code: Description: ICD-10 Diagnosis Description L89.153 Pressure ulcer of  sacral region, stage 3 Modifier: Quantity: Electronic Signature(s) Signed: 12/11/2020 4:01:22 PM By: Bruce Smith Keeler PA-C Entered By: Bruce Smith Keeler on 12/11/2020 16:01:21

## 2020-12-14 NOTE — Progress Notes (Signed)
Niyam, Bisping Jonathyn ([id number]) Visit Report for 12/11/2020 Allergy List Details Patient Name: Bruce Smith, Bruce Smith Date of Service: 12/11/2020 2:45 PM Medical Record Number: [id number] Patient Account Number: [id number] Date of Birth/Sex: 28-May-1951 (70 y.o. Male) Treating RN: Dolan Amen Primary Care Provider: Gwenyth Allegra Other Clinician: Referring Provider: Burney Gauze Treating Provider/Extender: Skipper Cliche in Treatment: 0 Allergies Active Allergies No Known Allergies Type: Allergen Allergy Notes Electronic Signature(s) Signed: 12/11/2020 4:22:29 PM By: Georges Mouse, Minus Breeding RN Entered By: Georges Mouse, Minus Breeding on 12/11/2020 14:25:03 Bernadene Bell, Lukas ([id number]) -------------------------------------------------------------------------------- Arrival Information Details Patient Name: Bruce Smith Date of Service: 12/11/2020 2:45 PM Medical Record Number: [id number] Patient Account Number: [id number] Date of Birth/Sex: July 25, 1951 (70 y.o. Male) Treating RN: Dolan Amen Primary Care Provider: Gwenyth Allegra Other Clinician: Referring Provider: Burney Gauze Treating Provider/Extender: Skipper Cliche in Treatment: 0 Visit Information Patient Arrived: Wheel Chair Arrival Time: 14:23 Accompanied By: self Transfer Assistance: Manual Patient Identification Verified: Yes Secondary Verification Process Completed: Yes Electronic Signature(s) Signed: 12/11/2020 4:22:29 PM By: Georges Mouse, Minus Breeding RN Entered By: Georges Mouse, Minus Breeding on 12/11/2020 14:23:42 [place], [place] ([id number]) -------------------------------------------------------------------------------- Clinic Level of Care Assessment Details Patient Name: Bruce Smith Date of Service: 12/11/2020 2:45 PM Medical Record Number: [id number] Patient Account Number: [id number] Date of Birth/Sex: 04/18/1951 (70 y.o. Male) Treating RN: Carlene Coria Primary Care Provider: Gwenyth Allegra Other Clinician: Referring Provider: Burney Gauze Treating Provider/Extender: Skipper Cliche in Treatment: 0 Clinic Level of Care Assessment Items TOOL 2 Quantity Score X - Use when only an EandM is performed on the INITIAL visit 1 0 ASSESSMENTS - Nursing Assessment / Reassessment X - General Physical Exam (combine w/ comprehensive assessment (listed just below) when performed on new 1 20 pt. evals) X- 1 25 Comprehensive Assessment (HX, ROS, Risk Assessments, Wounds Hx, etc.) ASSESSMENTS - Wound and Skin Assessment / Reassessment X - Simple Wound Assessment / Reassessment - one wound 1 5 []  - 0 Complex Wound Assessment / Reassessment - multiple wounds []  - 0 Dermatologic / Skin Assessment (not related to wound area) ASSESSMENTS - Ostomy and/or Continence Assessment and Care []  - Incontinence Assessment and Management 0 []  - 0 Ostomy Care Assessment and Management (repouching, etc.) PROCESS - Coordination of Care X - Simple Patient / Family Education for ongoing care 1 15 []  - 0 Complex (extensive) Patient / Family Education for ongoing care X- 1 10 Staff obtains Programmer, systems, Records, Test Results / Process Orders []  - 0 Staff telephones HHA, Nursing Homes / Clarify orders / etc []  - 0 Routine Transfer to another Facility (non-emergent condition) []  - 0 Routine Hospital Admission (non-emergent condition) X- 1 15 New Admissions / Biomedical engineer / Ordering NPWT, Apligraf, etc. []  - 0 Emergency Hospital Admission (emergent condition) X- 1 10 Simple Discharge Coordination []  - 0 Complex (extensive) Discharge Coordination PROCESS - Special Needs []  - Pediatric / Minor Patient Management 0 []  - 0 Isolation Patient Management []  - 0 Hearing / Language / Visual special needs []  - 0 Assessment of Community assistance (transportation, D/C planning, etc.) []  - 0 Additional assistance / Altered mentation []  - 0 Support Surface(s) Assessment (bed, cushion, seat, etc.) INTERVENTIONS - Wound Cleansing /  Measurement X - Wound Imaging (photographs - any number of wounds) 1 5 []  - 0 Wound Tracing (instead of photographs) X- 1 5 Simple Wound Measurement - one wound []  - 0 Complex Wound Measurement - multiple wounds Sacc[place], Aedan ([id number]) X- 1 5 Simple Wound Cleansing - one wound []  - 0 Complex Wound  Cleansing - multiple wounds INTERVENTIONS - Wound Dressings []  - Small Wound Dressing one or multiple wounds 0 X- 1 15 Medium Wound Dressing one or multiple wounds []  - 0 Large Wound Dressing one or multiple wounds []  - 0 Application of Medications - injection INTERVENTIONS - Miscellaneous []  - External ear exam 0 []  - 0 Specimen Collection (cultures, biopsies, blood, body fluids, etc.) []  - 0 Specimen(s) / Culture(s) sent or taken to Lab for analysis []  - 0 Patient Transfer (multiple staff / Harrel Lemon Lift / Similar devices) []  - 0 Simple Staple / Suture removal (25 or less) []  - 0 Complex Staple / Suture removal (26 or more) []  - 0 Hypo / Hyperglycemic Management (close monitor of Blood Glucose) X- 1 15 Ankle / Brachial Index (ABI) - do not check if billed separately Has the patient been seen at the hospital within the last three years: Yes Total Score: 145 Level Of Care: New/Established - Level 4 Electronic Signature(s) Signed: 12/14/2020 4:08:54 PM By: Carlene Coria RN Entered By: Carlene Coria on 12/11/2020 15:11:54 [place], [place] ([id number]) -------------------------------------------------------------------------------- Encounter Discharge Information Details Patient Name: Bruce Smith Date of Service: 12/11/2020 2:45 PM Medical Record Number: [id number] Patient Account Number: [id number] Date of Birth/Sex: 18-Mar-1951 (70 y.o. Male) Treating RN: Carlene Coria Primary Care Provider: Gwenyth Allegra Other Clinician: Referring Provider: Burney Gauze Treating Provider/Extender: Skipper Cliche in Treatment: 0 Encounter Discharge Information Items Post Procedure  Vitals Discharge Condition: Stable Unable to obtain vitals Reason: timing Ambulatory Status: Wheelchair Discharge Destination: Home Transportation: Private Auto Accompanied By: sisterLetta Median Schedule Follow-up Appointment: Yes Clinical Summary of Care: Electronic Signature(s) Signed: 12/14/2020 4:08:54 PM By: Carlene Coria RN Entered By: Carlene Coria on 12/11/2020 15:20:36 [place], Theoren ([id number]) -------------------------------------------------------------------------------- Lower Extremity Assessment Details Patient Name: Bruce Smith Date of Service: 12/11/2020 2:45 PM Medical Record Number: [id number] Patient Account Number: [id number] Date of Birth/Sex: 03-01-1951 (69 y.o. Male) Treating RN: Dolan Amen Primary Care Provider: Gwenyth Allegra Other Clinician: Referring Provider: Burney Gauze Treating Provider/Extender: Jeri Cos Weeks in Treatment: 0 Electronic Signature(s) Signed: 12/11/2020 4:22:29 PM By: Georges Mouse, Minus Breeding RN Entered By: Georges Mouse, Kenia on 12/11/2020 14:42:24 [place], [place] ([id number]) -------------------------------------------------------------------------------- Multi Wound Chart Details Patient Name: Bruce Smith Date of Service: 12/11/2020 2:45 PM Medical Record Number: [id number] Patient Account Number: [id number] Date of Birth/Sex: [birth year] (69 y.o. Male) Treating RN: Carlene Coria Primary Care Provider: Gwenyth Allegra Other Clinician: Referring Provider: Burney Gauze Treating Provider/Extender: Skipper Cliche in Treatment: 0 Vital Signs Height(in): 68 Pulse(bpm): 88 Weight(lbs): 160 Blood Pressure(mmHg): 115/69 Body Mass Index(BMI): 24 Temperature(F): 98.2 Respiratory Rate(breaths/min): 16 Photos: [N/A:N/A] Wound Location: Midline Gluteus N/A N/A Wounding Event: Pressure Injury N/A N/A Primary Etiology: Pressure Ulcer N/A N/A Comorbid History: Coronary Artery Disease, N/A N/A Hypertension, Myocardial Infarction, History  of pressure wounds, Neuropathy, Received Chemotherapy, Received Radiation Date Acquired: 12/02/2020 N/A N/A Weeks of Treatment: 0 N/A N/A Wound Status: Open N/A N/A Measurements L x W x D (cm) 1.2x0.5x0.2 N/A N/A Area (cm) : 0.471 N/A N/A Volume (cm) : 0.094 N/A N/A Classification: Category/Stage III N/A N/A Exudate Amount: Medium N/A N/A Exudate Type: Serosanguineous N/A N/A Exudate Color: red, brown N/A N/A Wound Margin: Flat and Intact N/A N/A Granulation Amount: None Present (0%) N/A N/A Necrotic Amount: Large (67-100%) N/A N/A Exposed Structures: Fascia: No N/A N/A Fat Layer (Subcutaneous Tissue): No Tendon: No Muscle: No Joint: No Bone: No Epithelialization: None N/A N/A Treatment Notes Electronic Signature(s) Signed: 12/14/2020 4:08:54 PM By: Carlene Coria RN Entered By:  Carlene Coria on 12/11/2020 14:59:46 VERBRUGGE, Ferd ([id number]) -------------------------------------------------------------------------------- Multi-Disciplinary Care Plan Details Patient Name: NASHAUN, HILLMER Date of Service: 12/11/2020 2:45 PM Medical Record Number: [id number] Patient Account Number: [id number] Date of Birth/Sex: 04-21-1951 (70 y.o. Male) Treating RN: Carlene Coria Primary Care Provider: Gwenyth Allegra Other Clinician: Referring Provider: Burney Gauze Treating Provider/Extender: Skipper Cliche in Treatment: 0 Active Inactive Wound/Skin Impairment Nursing Diagnoses: Knowledge deficit related to ulceration/compromised skin integrity Goals: Patient/caregiver will verbalize understanding of skin care regimen Date Initiated: 12/11/2020 Target Resolution Date: 01/08/2021 Goal Status: Active Ulcer/skin breakdown will have a volume reduction of 30% by week 4 Date Initiated: 12/11/2020 Target Resolution Date: 01/08/2021 Goal Status: Active Ulcer/skin breakdown will have a volume reduction of 50% by week 8 Date Initiated: 12/11/2020 Target Resolution Date: 02/08/2021 Goal Status:  Active Ulcer/skin breakdown will have a volume reduction of 80% by week 12 Date Initiated: 12/11/2020 Target Resolution Date: 03/10/2021 Goal Status: Active Ulcer/skin breakdown will heal within 14 weeks Date Initiated: 12/11/2020 Target Resolution Date: 04/10/2021 Goal Status: Active Interventions: Assess patient/caregiver ability to obtain necessary supplies Assess patient/caregiver ability to perform ulcer/skin care regimen upon admission and as needed Assess ulceration(s) every visit Notes: Electronic Signature(s) Signed: 12/14/2020 4:08:54 PM By: Carlene Coria RN Entered By: Carlene Coria on 12/11/2020 14:59:29 [place], [place] ([id number]) -------------------------------------------------------------------------------- Pain Assessment Details Patient Name: Bruce Smith Date of Service: 12/11/2020 2:45 PM Medical Record Number: [id number] Patient Account Number: [id number] Date of Birth/Sex: 12/11/1950 (70 y.o. Male) Treating RN: Dolan Amen Primary Care Provider: Gwenyth Allegra Other Clinician: Referring Provider: Burney Gauze Treating Provider/Extender: Skipper Cliche in Treatment: 0 Active Problems Location of Pain Severity and Description of Pain Patient Has Paino Yes Site Locations Pain Location: Pain in Ulcers Rate the pain. Current Pain Level: 8 Pain Management and Medication Current Pain Management: Notes "pain goes up to 1010 when sitting on wound" Electronic Signature(s) Signed: 12/11/2020 4:22:29 PM By: Georges Mouse, Minus Breeding RN Entered By: Georges Mouse, [place]ia on 12/11/2020 14:24:16 Bernadene Bell, [place] ([id number]) -------------------------------------------------------------------------------- Patient/Caregiver Education Details Patient Name: Bruce Smith Date of Service: 12/11/2020 2:45 PM Medical Record Number: [id number] Patient Account Number: [id number] Date of Birth/Gender: 1951-07-10 (70 y.o. Male) Treating RN: Carlene Coria Primary Care Physician: Gwenyth Allegra Other Clinician: Referring Physician: Burney Gauze Treating Physician/Extender: Skipper Cliche in Treatment: 0 Education Assessment Education Provided To: Patient Education Topics Provided Wound/Skin Impairment: Methods: Explain/Verbal Responses: State content correctly Electronic Signature(s) Signed: 12/14/2020 4:08:54 PM By: Carlene Coria RN Entered By: Carlene Coria on 12/11/2020 15:12:10 [place], [place] ([id number]) -------------------------------------------------------------------------------- Wound Assessment Details Patient Name: Bruce Smith Date of Service: 12/11/2020 2:45 PM Medical Record Number: [id number] Patient Account Number: [id number] Date of Birth/Sex: 1951/07/14 (69 y.o. Male) Treating RN: Dolan Amen Primary Care Provider: Gwenyth Allegra Other Clinician: Referring Provider: Burney Gauze Treating Provider/Extender: Skipper Cliche in Treatment: 0 Wound Status Wound Number: 1 Primary Pressure Ulcer Etiology: Wound Location: Midline Gluteus Wound Open Wounding Event: Pressure Injury Status: Date Acquired: 12/02/2020 Comorbid Coronary Artery Disease, Hypertension, Myocardial Weeks Of Treatment: 0 History: Infarction, History of pressure wounds, Neuropathy, Clustered Wound: No Received Chemotherapy, Received Radiation Photos Wound Measurements Length: (cm) 1.2 Width: (cm) 0.5 Depth: (cm) 0.2 Area: (cm) 0.471 Volume: (cm) 0.094 % Reduction in Area: % Reduction in Volume: Epithelialization: None Tunneling: No Undermining: No Wound Description Classification: Category/Stage III Wound Margin: Flat and Intact Exudate Amount: Medium Exudate Type: Serosanguineous Exudate Color: red, brown Foul Odor After Cleansing: No Slough/Fibrino Yes Wound Bed Granulation Amount: None Present (0%) Exposed Structure Necrotic  Amount: Large (67-100%) Fascia Exposed: No Necrotic Quality: Adherent Slough Fat Layer (Subcutaneous Tissue) Exposed: No Tendon  Exposed: No Muscle Exposed: No Joint Exposed: No Bone Exposed: No Electronic Signature(s) Signed: 12/11/2020 4:22:29 PM By: Georges Mouse, Minus Breeding RN Entered By: Georges Mouse, Minus Breeding on 12/11/2020 14:40:47 Bernadene Bell, [place] ([id number]) -------------------------------------------------------------------------------- [place] Details Patient Name: Bruce Smith Date of Service: 12/11/2020 2:45 PM Medical Record Number: [id number] Patient Account Number: [id number] Date of Birth/Sex: December 27, 1950 (70 y.o. Male) Treating RN: Dolan Amen Primary Care Provider: Gwenyth Allegra Other Clinician: Referring Provider: Burney Gauze Treating Provider/Extender: Skipper Cliche in Treatment: 0 Vital Signs Time Taken: 14:24 Temperature (F): 98.2 Height (in): 68 Pulse (bpm): 88 Source: Stated Respiratory Rate (breaths/min): 16 Weight (lbs): 160 Blood Pressure (mmHg): 115/69 Source: Stated Reference Range: 80 - 120 mg / dl Body Mass Index (BMI): 24.3 Electronic Signature(s) Signed: 12/11/2020 4:22:29 PM By: Georges Mouse, Minus Breeding RN Entered By: Georges Mouse, Minus Breeding on 12/11/2020 14:24:51

## 2020-12-16 ENCOUNTER — Inpatient Hospital Stay: Payer: BC Managed Care – PPO

## 2020-12-16 ENCOUNTER — Ambulatory Visit: Payer: BC Managed Care – PPO

## 2020-12-18 ENCOUNTER — Encounter: Payer: BC Managed Care – PPO | Admitting: Physician Assistant

## 2020-12-18 ENCOUNTER — Other Ambulatory Visit: Payer: Self-pay

## 2020-12-18 DIAGNOSIS — L89153 Pressure ulcer of sacral region, stage 3: Secondary | ICD-10-CM | POA: Diagnosis not present

## 2020-12-18 NOTE — Progress Notes (Signed)
KENDEL, BESSEY ([id number]) Visit Report for 12/18/2020 Arrival Information Details Patient Name: Bruce Smith, Bruce Smith Date of Service: 12/18/2020 2:15 PM Medical Record Number: [id number] Patient Account Number: [id number] Date of Birth/Sex: 15-Nov-1950 (70 y.o. M) Treating RN: Cornell Barman Primary Care Basheer Molchan: Gwenyth Allegra Other Clinician: Referring Lenord Fralix: Gwenyth Allegra Treating Elleanna Melling/Extender: Skipper Cliche in Treatment: 1 Visit Information History Since Last Visit Added or deleted any medications: No Patient Arrived: Wheel Chair Has Dressing in Place as Prescribed: Yes Arrival Time: 14:32 Pain Present Now: No Accompanied By: wife Transfer Assistance: Manual Patient Identification Verified: Yes Secondary Verification Process Completed: Yes Patient Has Alerts: Yes Patient Alerts: Not Diabetic Electronic Signature(s) Signed: 12/18/2020 5:54:55 PM By: Gretta Cool, BSN, RN, CWS, Kim RN, BSN Entered By: Gretta Cool, BSN, RN, CWS, Kim on 12/18/2020 14:34:05 Bruce Smith, Bruce Smith ([id number]) -------------------------------------------------------------------------------- Clinic Level of Care Assessment Details Patient Name: Bruce Smith Date of Service: 12/18/2020 2:15 PM Medical Record Number: [id number] Patient Account Number: [id number] Date of Birth/Sex: 06-04-1951 (69 y.o. M) Treating RN: Cornell Barman Primary Care Muriah Harsha: Gwenyth Allegra Other Clinician: Referring Willer Osorno: Gwenyth Allegra Treating Krrish Freund/Extender: Skipper Cliche in Treatment: 1 Clinic Level of Care Assessment Items TOOL 4 Quantity Score []  - Use when only an EandM is performed on FOLLOW-UP visit 0 ASSESSMENTS - Nursing Assessment / Reassessment X - Reassessment of Co-morbidities (includes updates in patient status) 1 10 X- 1 5 Reassessment of Adherence to Treatment Plan ASSESSMENTS - Wound and Skin Assessment / Reassessment X - Simple Wound Assessment / Reassessment - one wound 1 5 []  - 0 Complex Wound Assessment / Reassessment -  multiple wounds []  - 0 Dermatologic / Skin Assessment (not related to wound area) ASSESSMENTS - Focused Assessment []  - Circumferential Edema Measurements - multi extremities 0 []  - 0 Nutritional Assessment / Counseling / Intervention []  - 0 Lower Extremity Assessment (monofilament, tuning fork, pulses) []  - 0 Peripheral Arterial Disease Assessment (using hand held doppler) ASSESSMENTS - Ostomy and/or Continence Assessment and Care []  - Incontinence Assessment and Management 0 []  - 0 Ostomy Care Assessment and Management (repouching, etc.) PROCESS - Coordination of Care X - Simple Patient / Family Education for ongoing care 1 15 []  - 0 Complex (extensive) Patient / Family Education for ongoing care X- 1 10 Staff obtains Programmer, systems, Records, Test Results / Process Orders []  - 0 Staff telephones HHA, Nursing Homes / Clarify orders / etc []  - 0 Routine Transfer to another Facility (non-emergent condition) []  - 0 Routine Hospital Admission (non-emergent condition) []  - 0 New Admissions / Biomedical engineer / Ordering NPWT, Apligraf, etc. []  - 0 Emergency Hospital Admission (emergent condition) X- 1 10 Simple Discharge Coordination []  - 0 Complex (extensive) Discharge Coordination PROCESS - Special Needs []  - Pediatric / Minor Patient Management 0 []  - 0 Isolation Patient Management []  - 0 Hearing / Language / Visual special needs []  - 0 Assessment of Community assistance (transportation, D/C planning, etc.) []  - 0 Additional assistance / Altered mentation []  - 0 Support Surface(s) Assessment (bed, cushion, seat, etc.) INTERVENTIONS - Wound Cleansing / Measurement Vickerman, Daneil ([id number]) X- 1 5 Simple Wound Cleansing - one wound []  - 0 Complex Wound Cleansing - multiple wounds X- 1 5 Wound Imaging (photographs - any number of wounds) []  - 0 Wound Tracing (instead of photographs) X- 1 5 Simple Wound Measurement - one wound []  - 0 Complex Wound Measurement -  multiple wounds INTERVENTIONS - Wound Dressings X - Small Wound Dressing one or multiple wounds 1 10 []  -  0 Medium Wound Dressing one or multiple wounds []  - 0 Large Wound Dressing one or multiple wounds X- 1 5 Application of Medications - topical []  - 0 Application of Medications - injection INTERVENTIONS - Miscellaneous []  - External ear exam 0 []  - 0 Specimen Collection (cultures, biopsies, blood, body fluids, etc.) []  - 0 Specimen(s) / Culture(s) sent or taken to Lab for analysis []  - 0 Patient Transfer (multiple staff / Civil Service fast streamer / Similar devices) []  - 0 Simple Staple / Suture removal (25 or less) []  - 0 Complex Staple / Suture removal (26 or more) []  - 0 Hypo / Hyperglycemic Management (close monitor of Blood Glucose) []  - 0 Ankle / Brachial Index (ABI) - do not check if billed separately X- 1 5 Vital Signs Has the patient been seen at the hospital within the last three years: Yes Total Score: 90 Level Of Care: New/Established - Level 3 Electronic Signature(s) Signed: 12/18/2020 5:54:55 PM By: Gretta Cool, BSN, RN, CWS, Kim RN, BSN Entered By: Gretta Cool, BSN, RN, CWS, Kim on 12/18/2020 17:17:51 Bruce Smith, Bruce Smith ([id number]) -------------------------------------------------------------------------------- Encounter Discharge Information Details Patient Name: Bruce Smith Date of Service: 12/18/2020 2:15 PM Medical Record Number: [id number] Patient Account Number: [id number] Date of Birth/Sex: [birth year] (69 y.o. M) Treating RN: Cornell Barman Primary Care Kutler Vanvranken: Gwenyth Allegra Other Clinician: Referring Domnique Vantine: Gwenyth Allegra Treating Anquanette Bahner/Extender: Skipper Cliche in Treatment: 1 Encounter Discharge Information Items Discharge Condition: Stable Ambulatory Status: Wheelchair Discharge Destination: Home Transportation: Private Auto Accompanied By: self Schedule Follow-up Appointment: Yes Clinical Summary of Care: Electronic Signature(s) Signed: 12/18/2020 5:19:13 PM By:  Gretta Cool, BSN, RN, CWS, Kim RN, BSN Entered By: Gretta Cool, BSN, RN, CWS, Kim on 12/18/2020 17:19:13 Bruce Smith, Bruce Smith ([id number]) -------------------------------------------------------------------------------- Lower Extremity Assessment Details Patient Name: Bruce Smith Date of Service: 12/18/2020 2:15 PM Medical Record Number: [id number] Patient Account Number: [id number] Date of Birth/Sex: 1951-03-25 (69 y.o. M) Treating RN: Cornell Barman Primary Care Virat Prather: Gwenyth Allegra Other Clinician: Referring Christyn Gutkowski: Gwenyth Allegra Treating Zaleah Ternes/Extender: Skipper Cliche in Treatment: 1 Electronic Signature(s) Signed: 12/18/2020 5:54:55 PM By: Gretta Cool, BSN, RN, CWS, Kim RN, BSN Entered By: Gretta Cool, BSN, RN, CWS, Kim on 12/18/2020 14:41:47 Bruce Smith, Bruce Smith ([id number]) -------------------------------------------------------------------------------- Multi Wound Chart Details Patient Name: Bruce Smith Date of Service: 12/18/2020 2:15 PM Medical Record Number: [id number] Patient Account Number: [id number] Date of Birth/Sex: [birth year] (69 y.o. M) Treating RN: Cornell Barman Primary Care Alaya Iverson: Gwenyth Allegra Other Clinician: Referring Matias Thurman: Gwenyth Allegra Treating Quorra Rosene/Extender: Skipper Cliche in Treatment: 1 Vital Signs Height(in): 68 Pulse(bpm): 109 Weight(lbs): 160 Blood Pressure(mmHg): 104/65 Body Mass Index(BMI): 24 Temperature(F): 98.2 Respiratory Rate(breaths/min): 18 Photos: [1:No Photos] [N/A:N/A] Wound Location: [1:Midline Sacrum] [N/A:N/A] Wounding Event: [1:Pressure Injury] [N/A:N/A] Primary Etiology: [1:Pressure Ulcer] [N/A:N/A] Comorbid History: [1:Coronary Artery Disease, Hypertension, Myocardial Infarction, History of pressure wounds, Neuropathy, Received Chemotherapy, Received Radiation] [N/A:N/A] Date Acquired: [1:12/02/2020] [N/A:N/A] Weeks of Treatment: [1:1] [N/A:N/A] Wound Status: [1:Open] [N/A:N/A] Measurements L x W x D (cm) [1:1.3x1x0.2] [N/A:N/A] Area (cm) : [1:1.021]  [N/A:N/A] Volume (cm) : [1:0.204] [N/A:N/A] % Reduction in Area: [1:-116.80%] [N/A:N/A] % Reduction in Volume: [1:-117.00%] [N/A:N/A] Classification: [1:Category/Stage III] [N/A:N/A] Exudate Amount: [1:Medium] [N/A:N/A] Exudate Type: [1:Serosanguineous] [N/A:N/A] Exudate Color: [1:red, brown] [N/A:N/A] Wound Margin: [1:Flat and Intact] [N/A:N/A] Granulation Amount: [1:None Present (0%)] [N/A:N/A] Necrotic Amount: [1:Large (67-100%)] [N/A:N/A] Exposed Structures: [1:Fat Layer (Subcutaneous Tissue): N/A Yes Fascia: No Tendon: No Muscle: No Joint: No Bone: No None] [N/A:N/A] Treatment Notes Electronic Signature(s) Signed: 12/18/2020 5:54:55 PM By: Gretta Cool, BSN, RN, CWS, Kim RN, BSN Entered By:  Gretta Cool, BSN, RN, CWS, Kim on 12/18/2020 14:53:07 Bruce Smith, Bruce Smith ([id number]) -------------------------------------------------------------------------------- Multi-Disciplinary Care Plan Details Patient Name: MANUAL, NAVARRA Date of Service: 12/18/2020 2:15 PM Medical Record Number: [id number] Patient Account Number: [id number] Date of Birth/Sex: 09/30/1951 (70 y.o. M) Treating RN: Cornell Barman Primary Care Jeanet Lupe: Gwenyth Allegra Other Clinician: Referring Michelangelo Rindfleisch: Gwenyth Allegra Treating Nyshaun Standage/Extender: Skipper Cliche in Treatment: 1 Active Inactive Pressure Nursing Diagnoses: Knowledge deficit related to management of pressures ulcers Potential for impaired tissue integrity related to pressure, friction, moisture, and shear Goals: Patient will remain free from development of additional pressure ulcers Date Initiated: 12/18/2020 Target Resolution Date: 12/18/2020 Goal Status: Active Interventions: Assess: immobility, friction, shearing, incontinence upon admission and as needed Provide education on pressure ulcers Notes: Wound/Skin Impairment Nursing Diagnoses: Knowledge deficit related to ulceration/compromised skin integrity Goals: Patient/caregiver will verbalize understanding of skin care  regimen Date Initiated: 12/11/2020 Target Resolution Date: 01/08/2021 Goal Status: Active Ulcer/skin breakdown will have a volume reduction of 30% by week 4 Date Initiated: 12/11/2020 Target Resolution Date: 01/08/2021 Goal Status: Active Ulcer/skin breakdown will have a volume reduction of 50% by week 8 Date Initiated: 12/11/2020 Target Resolution Date: 02/08/2021 Goal Status: Active Ulcer/skin breakdown will have a volume reduction of 80% by week 12 Date Initiated: 12/11/2020 Target Resolution Date: 03/10/2021 Goal Status: Active Ulcer/skin breakdown will heal within 14 weeks Date Initiated: 12/11/2020 Target Resolution Date: 04/10/2021 Goal Status: Active Interventions: Assess patient/caregiver ability to obtain necessary supplies Assess patient/caregiver ability to perform ulcer/skin care regimen upon admission and as needed Assess ulceration(s) every visit Notes: Electronic Signature(s) Signed: 12/18/2020 5:54:55 PM By: Gretta Cool, BSN, RN, CWS, Kim RN, BSN Entered By: Gretta Cool, BSN, RN, CWS, Kim on 12/18/2020 14:52:58 Bruce Smith, Bruce Smith ([id number]) -------------------------------------------------------------------------------- Pain Assessment Details Patient Name: Bruce Smith Date of Service: 12/18/2020 2:15 PM Medical Record Number: [id number] Patient Account Number: [id number] Date of Birth/Sex: 10-21-1951 (69 y.o. M) Treating RN: Cornell Barman Primary Care Shiloh Swopes: Gwenyth Allegra Other Clinician: Referring Treyana Sturgell: Gwenyth Allegra Treating Nicki Gracy/Extender: Skipper Cliche in Treatment: 1 Active Problems Location of Pain Severity and Description of Pain Patient Has Paino No Site Locations Pain Management and Medication Current Pain Management: Notes Patient denies pain at this time. Electronic Signature(s) Signed: 12/18/2020 5:54:55 PM By: Gretta Cool, BSN, RN, CWS, Kim RN, BSN Entered By: Gretta Cool, BSN, RN, CWS, Kim on 12/18/2020 14:35:02 Bruce Smith, Bruce Smith  ([id number]) -------------------------------------------------------------------------------- Patient/Caregiver Education Details Patient Name: Bruce Smith Date of Service: 12/18/2020 2:15 PM Medical Record Number: [id number] Patient Account Number: [id number] Date of Birth/Gender: 1951-06-24 (69 y.o. M) Treating RN: Cornell Barman Primary Care Physician: Gwenyth Allegra Other Clinician: Referring Physician: Gwenyth Allegra Treating Physician/Extender: Skipper Cliche in Treatment: 1 Education Assessment Education Provided To: Patient Education Topics Provided Pressure: Handouts: Pressure Ulcers: Care and Offloading Methods: Demonstration, Explain/Verbal Responses: State content correctly Wound/Skin Impairment: Handouts: Caring for Your Ulcer Methods: Demonstration, Explain/Verbal Responses: State content correctly Electronic Signature(s) Signed: 12/18/2020 5:54:55 PM By: Gretta Cool, BSN, RN, CWS, Kim RN, BSN Entered By: Gretta Cool, BSN, RN, CWS, Kim on 12/18/2020 17:18:38 Bruce Smith, Bruce Smith ([id number]) -------------------------------------------------------------------------------- Wound Assessment Details Patient Name: Bruce Smith Date of Service: 12/18/2020 2:15 PM Medical Record Number: [id number] Patient Account Number: [id number] Date of Birth/Sex: 01-Aug-1951 (69 y.o. M) Treating RN: Cornell Barman Primary Care Biridiana Twardowski: Gwenyth Allegra Other Clinician: Referring Lianna Sitzmann: Gwenyth Allegra Treating Alayziah Tangeman/Extender: Jeri Cos Weeks in Treatment: 1 Wound Status Wound Number: 1 Primary Pressure Ulcer Etiology: Wound Location: Midline Sacrum Wound Open Wounding Event: Pressure Injury Status: Date Acquired: 12/02/2020 Comorbid Coronary Artery Disease, Hypertension,  Myocardial Weeks Of Treatment: 1 History: Infarction, History of pressure wounds, Neuropathy, Clustered Wound: No Received Chemotherapy, Received Radiation Photos Photo Uploaded By: Gretta Cool, BSN, RN, CWS, Kim on 12/18/2020 15:36:43 Wound  Measurements Length: (cm) 1.3 Width: (cm) 1 Depth: (cm) 0.2 Area: (cm) 1.021 Volume: (cm) 0.204 % Reduction in Area: -116.8% % Reduction in Volume: -117% Epithelialization: None Tunneling: No Undermining: No Wound Description Classification: Category/Stage III Wound Margin: Flat and Intact Exudate Amount: Medium Exudate Type: Serosanguineous Exudate Color: red, brown Foul Odor After Cleansing: No Slough/Fibrino Yes Wound Bed Granulation Amount: None Present (0%) Exposed Structure Necrotic Amount: Large (67-100%) Fascia Exposed: No Necrotic Quality: Adherent Slough Fat Layer (Subcutaneous Tissue) Exposed: Yes Tendon Exposed: No Muscle Exposed: No Joint Exposed: No Bone Exposed: No Treatment Notes Wound #1 (Sacrum) Wound Laterality: Midline Cleanser Soap and Water Discharge Instruction: Gently cleanse wound with antibacterial soap, rinse and pat dry prior to dressing wounds Bruce Smith, Bruce Smith ([id number]) Peri-Wound Care Topical Primary Dressing Bordered Gauze Adhesive [place] Wound Dressing 6x6 (in/in) IODOFLEX 0.9% Cadexomer Iodine Pad Discharge Instruction: Apply Iodoflex to wound bed only as directed. Secondary Dressing Secured With Compression Wrap Compression Stockings Environmental education officer) Signed: 12/18/2020 5:54:55 PM By: Gretta Cool, BSN, RN, CWS, Kim RN, BSN Entered By: Gretta Cool, BSN, RN, CWS, Kim on 12/18/2020 14:41:38 Bruce Smith, Bruce Smith ([id number]) -------------------------------------------------------------------------------- [place] Details Patient Name: Bruce Smith Date of Service: 12/18/2020 2:15 PM Medical Record Number: [id number] Patient Account Number: [id number] Date of Birth/Sex: 03-May-1951 (69 y.o. M) Treating RN: Cornell Barman Primary Care Larnce Schnackenberg: Gwenyth Allegra Other Clinician: Referring Earnie Rockhold: Gwenyth Allegra Treating Ruvim Risko/Extender: Skipper Cliche in Treatment: 1 Vital Signs Time Taken: 14:34 Temperature (F): 98.2 Height (in):  68 Pulse (bpm): 109 Weight (lbs): 160 Respiratory Rate (breaths/min): 18 Body Mass Index (BMI): 24.3 Blood Pressure (mmHg): 104/65 Reference Range: 80 - 120 mg / dl Electronic Signature(s) Signed: 12/18/2020 5:54:55 PM By: Gretta Cool, BSN, RN, CWS, Kim RN, BSN Entered By: Gretta Cool, BSN, RN, CWS, Kim on 12/18/2020 14:34:36

## 2020-12-18 NOTE — Progress Notes (Addendum)
ALEXXANDER, KURT ([id number]) Visit Report for 12/18/2020 Chief Complaint Document Details Patient Name: Bruce Smith, Bruce Smith Date of Service: 12/18/2020 2:15 PM Medical Record Number: [id number] Patient Account Number: [id number] Date of Birth/Sex: 28-May-1951 (70 y.o. M) Treating RN: Cornell Barman Primary Care Provider: Gwenyth Allegra Other Clinician: Referring Provider: Gwenyth Allegra Treating Provider/Extender: Skipper Cliche in Treatment: 1 Information Obtained from: Patient Chief Complaint Sacral pressure ulcer Electronic Signature(s) Signed: 12/18/2020 2:38:17 PM By: Worthy Keeler PA-C Entered By: Worthy Keeler on 12/18/2020 14:38:17 Bernadene Bell, Kincaid ([id number]) -------------------------------------------------------------------------------- HPI Details Patient Name: Bruce Smith Date of Service: 12/18/2020 2:15 PM Medical Record Number: [id number] Patient Account Number: [id number] Date of Birth/Sex: Nov 30, 1950 (70 y.o. M) Treating RN: Cornell Barman Primary Care Provider: Gwenyth Allegra Other Clinician: Referring Provider: Gwenyth Allegra Treating Provider/Extender: Skipper Cliche in Treatment: 1 History of Present Illness HPI Description: 12/11/2020 upon evaluation today patient appears to be doing okay though he does have a wound in the sacral region which is a stage III pressure ulcer that appears to have some slough noted on the base of the wound. He is going require some debridement here today. With that being said I think that he may end up needing some medication of one sort or another to try to help clean this up as well. I discussed that with him today but nonetheless we are going to proceed with some debridement. He does have multiple myeloma and subsequently this is currently still being treated to some degree at least he is scheduled to have a bone marrow transplant shortly and then we will not be coming out for a while in order to try to isolate as much as possible to protect his immune  system. He does have some generalized weakness which from him being very sick that is what ended with him actually having this wound in the sacral region when he was not able to get up and move as easily as he normally had. Fortunately there is no signs of active infection at this time. No fevers, chills, nausea, vomiting, or diarrhea. 12/18/2020 upon evaluation today patient appears to be doing better in regard to his wound. He has been tolerating the dressing changes without complication. Fortunately I do feel like it is getting a little better from the standpoint of the slough which is somewhat physically buildup on the surface of the wound as well as some of the necrotic tissue in the central portion of the wound. I do feel like it is loosening up with Iodoflex that he did not get his supplies until apparently Wednesday though he did not know that he had gotten them as they have been at appointments at [facility] Nonetheless I do believe that was delivered which is good news and I do think that the patient hopefully should be able to have this at home to continue with appropriate treatment over the next week or so. After that he will be admitted to the hospital the 21st at [facility] for treatment and will be in the hospital until somewhere around mid March. Electronic Signature(s) Signed: 12/18/2020 2:58:51 PM By: Worthy Keeler PA-C Entered By: Worthy Keeler on 12/18/2020 14:58:50 Bruce Smith, Bruce Smith ([id number]) -------------------------------------------------------------------------------- Physical Exam Details Patient Name: Bruce Smith Date of Service: 12/18/2020 2:15 PM Medical Record Number: [id number] Patient Account Number: [id number] Date of Birth/Sex: 20-Aug-1951 (70 y.o. M) Treating RN: Cornell Barman Primary Care Provider: Gwenyth Allegra Other Clinician: Referring Provider: Gwenyth Allegra Treating Provider/Extender: Jeri Cos Weeks in Treatment: 1 Constitutional Well-nourished and  well-hydrated in  no acute distress. Respiratory normal breathing without difficulty. Psychiatric this patient is able to make decisions and demonstrates good insight into disease process. Alert and Oriented x 3. pleasant and cooperative. Notes Upon inspection patient's wound bed did show some signs of necrotic tissue noted in the central portion of the wound. Around the edges he showed some signs of epithelization and some signs as well of granulation. I do believe that he is headed in the right direction though still may take a little bit of time before he gets more would like to have him which is with everything completely healed. Electronic Signature(s) Signed: 12/18/2020 2:59:22 PM By: Worthy Keeler PA-C Entered By: Worthy Keeler on 12/18/2020 14:59:22 Bruce Smith, Bruce Smith ([id number]) -------------------------------------------------------------------------------- Physician Orders Details Patient Name: Bruce Smith Date of Service: 12/18/2020 2:15 PM Medical Record Number: [id number] Patient Account Number: [id number] Date of Birth/Sex: 1951-07-16 (70 y.o. M) Treating RN: Cornell Barman Primary Care Provider: Gwenyth Allegra Other Clinician: Referring Provider: Gwenyth Allegra Treating Provider/Extender: Skipper Cliche in Treatment: 1 Verbal / Phone Orders: No Diagnosis Coding ICD-10 Coding Code Description L89.153 Pressure ulcer of sacral region, stage 3 C90.01 Multiple myeloma in remission M62.81 Muscle weakness (generalized) Discharge From [facility] Services Wound #1 Midline Sacrum o Transfer Care to other provider - [facility] for stem cell replacement. Follow-up Appointments o Other: - As needed. Bathing/ Shower/ Hygiene o May shower; gently cleanse wound with antibacterial soap, rinse and pat dry prior to dressing wounds Off-Loading o Turn and reposition every 2 hours o Other: - Protect pressure points with pillows (knees, ankles, elbows). Wound Treatment Wound #1 - Sacrum Wound Laterality:  Midline Cleanser: Soap and Water Every Other Day/30 Days Discharge Instructions: Gently cleanse wound with antibacterial soap, rinse and pat dry prior to dressing wounds Primary Dressing: [place] Wound Dressing 6x6 (in/in) (Generic) Every Other Day/30 Days Primary Dressing: IODOFLEX 0.9% Cadexomer Iodine Pad (Generic) Every Other Day/30 Days Discharge Instructions: Apply Iodoflex to wound bed only as directed. Electronic Signature(s) Signed: 12/18/2020 5:17:16 PM By: Gretta Cool, BSN, RN, CWS, Kim RN, BSN Signed: 12/21/2020 5:46:59 PM By: Worthy Keeler PA-C Entered By: Gretta Cool BSN, RN, CWS, Kim on 12/18/2020 17:17:15 Bruce Smith, Bruce Smith ([id number]) -------------------------------------------------------------------------------- Problem List Details Patient Name: Bruce Smith Date of Service: 12/18/2020 2:15 PM Medical Record Number: [id number] Patient Account Number: [id number] Date of Birth/Sex: 18-Mar-1951 (70 y.o. M) Treating RN: Cornell Barman Primary Care Provider: Gwenyth Allegra Other Clinician: Referring Provider: Gwenyth Allegra Treating Provider/Extender: Skipper Cliche in Treatment: 1 Active Problems ICD-10 Encounter Code Description Active Date MDM Diagnosis L89.153 Pressure ulcer of sacral region, stage 3 12/11/2020 No Yes C90.01 Multiple myeloma in remission 12/11/2020 No Yes M62.81 Muscle weakness (generalized) 12/11/2020 No Yes Inactive Problems Resolved Problems Electronic Signature(s) Signed: 12/18/2020 2:38:10 PM By: Worthy Keeler PA-C Entered By: Worthy Keeler on 12/18/2020 14:38:10 Bruce Smith, Bruce Smith ([id number]) -------------------------------------------------------------------------------- Progress Note Details Patient Name: Bruce Smith Date of Service: 12/18/2020 2:15 PM Medical Record Number: [id number] Patient Account Number: [id number] Date of Birth/Sex: 11-Jan-1951 (70 y.o. M) Treating RN: Cornell Barman Primary Care Provider: Gwenyth Allegra Other  Clinician: Referring Provider: Gwenyth Allegra Treating Provider/Extender: Skipper Cliche in Treatment: 1 Subjective Chief Complaint Information obtained from Patient Sacral pressure ulcer History of Present Illness (HPI) 12/11/2020 upon evaluation today patient appears to be doing okay though he does have a wound in the sacral region which is a stage III pressure ulcer that appears to have some slough noted on the base  of the wound. He is going require some debridement here today. With that being said I think that he may end up needing some medication of one sort or another to try to help clean this up as well. I discussed that with him today but nonetheless we are going to proceed with some debridement. He does have multiple myeloma and subsequently this is currently still being treated to some degree at least he is scheduled to have a bone marrow transplant shortly and then we will not be coming out for a while in order to try to isolate as much as possible to protect his immune system. He does have some generalized weakness which from him being very sick that is what ended with him actually having this wound in the sacral region when he was not able to get up and move as easily as he normally had. Fortunately there is no signs of active infection at this time. No fevers, chills, nausea, vomiting, or diarrhea. 12/18/2020 upon evaluation today patient appears to be doing better in regard to his wound. He has been tolerating the dressing changes without complication. Fortunately I do feel like it is getting a little better from the standpoint of the slough which is somewhat physically buildup on the surface of the wound as well as some of the necrotic tissue in the central portion of the wound. I do feel like it is loosening up with Iodoflex that he did not get his supplies until apparently Wednesday though he did not know that he had gotten them as they have been at appointments [facility]  Nonetheless I do believe that was delivered which is good news and I do think that the patient hopefully should be able to have this at home to continue with appropriate treatment over the next week or so. After that he will be admitted to the hospital the 21st at [facility] for treatment and will be in the hospital until somewhere around mid March. Objective Constitutional Well-nourished and well-hydrated in no acute distress. Vitals Time Taken: 2:34 PM, Height: 68 in, Weight: 160 lbs, BMI: 24.3, Temperature: 98.2 F, Pulse: 109 bpm, Respiratory Rate: 18 breaths/min, Blood Pressure: 104/65 mmHg. Respiratory normal breathing without difficulty. Psychiatric this patient is able to make decisions and demonstrates good insight into disease process. Alert and Oriented x 3. pleasant and cooperative. General Notes: Upon inspection patient's wound bed did show some signs of necrotic tissue noted in the central portion of the wound. Around the edges he showed some signs of epithelization and some signs as well of granulation. I do believe that he is headed in the right direction though still may take a little bit of time before he gets more would like to have him which is with everything completely healed. Integumentary (Hair, Skin) Wound #1 status is Open. Original cause of wound was Pressure Injury. The wound is located on the Midline Sacrum. The wound measures 1.3cm length x 1cm width x 0.2cm depth; 1.021cm^2 area and 0.204cm^3 volume. There is Fat Layer (Subcutaneous Tissue) exposed. There is no tunneling or undermining noted. There is a medium amount of serosanguineous drainage noted. The wound margin is flat and intact. There is no granulation within the wound bed. There is a large (67-100%) amount of necrotic tissue within the wound bed including Adherent Slough. Assessment Active Problems Bruce Smith, Bruce Smith ([id number]) ICD-10 Pressure ulcer of sacral region, stage 3 Multiple myeloma in  remission Muscle weakness (generalized) Plan 1. Would recommend currently that we going continue  with the wound care measures as before utilizing the Iodoflex which I think has been beneficial up to this point. 2. I am also can recommend the patient continue to offload is much as possible obviously he has a lot of appointments and during the time that he has appointments he is not really able to offload. For that reason I suggested that he may want to get a memory foam pillow in order to put in his wheelchair so that he does at least have some pressure relief during these appointment times. At this point we will get a see the patient back for follow-up visit as needed as he will be in the hospital over roughly the next 3 to 4 weeks. Once he gets out if he still requires our services we will definitely be happy to see him back. Electronic Signature(s) Signed: 12/18/2020 3:00:22 PM By: Worthy Keeler PA-C Previous Signature: 12/18/2020 2:59:56 PM Version By: Worthy Keeler PA-C Entered By: Worthy Keeler on 12/18/2020 15:00:22 [place], Bruce Smith ([id number]) -------------------------------------------------------------------------------- SuperBill Details Patient Name: Bruce Smith Date of Service: 12/18/2020 Medical Record Number: [id number] Patient Account Number: [id number] Date of Birth/Sex: [birth year] (70 y.o. M) Treating RN: Cornell Barman Primary Care Provider: Gwenyth Allegra Other Clinician: Referring Provider: Gwenyth Allegra Treating Provider/Extender: Skipper Cliche in Treatment: 1 Diagnosis Coding ICD-10 Codes Code Description [phone] Pressure ulcer of sacral region, stage 3 C90.01 Multiple myeloma in remission M62.81 Muscle weakness (generalized) Facility Procedures CPT4 Code: [id number] Description: 03500 - WOUND CARE VISIT-LEV 3 EST PT Modifier: Quantity: 1 Physician Procedures CPT4 Code: [id number] Description: 93716 - WC PHYS LEVEL 3 - EST PT Modifier: Quantity: 1 CPT4  Code: Description: ICD-10 Diagnosis Description L89.153 Pressure ulcer of sacral region, stage 3 C90.01 Multiple myeloma in remission M62.81 Muscle weakness (generalized) Modifier: Quantity: Electronic Signature(s) Signed: 12/18/2020 5:18:15 PM By: Gretta Cool, BSN, RN, CWS, Kim RN, BSN Signed: 12/21/2020 5:46:59 PM By: Worthy Keeler PA-C Previous Signature: 12/18/2020 3:00:34 PM Version By: Worthy Keeler PA-C Entered By: Gretta Cool, BSN, RN, CWS, Kim on 12/18/2020 17:18:15

## 2020-12-23 ENCOUNTER — Ambulatory Visit: Payer: BC Managed Care – PPO

## 2020-12-23 ENCOUNTER — Ambulatory Visit: Payer: BC Managed Care – PPO | Admitting: Hematology & Oncology

## 2020-12-23 ENCOUNTER — Other Ambulatory Visit: Payer: BC Managed Care – PPO

## 2020-12-30 ENCOUNTER — Other Ambulatory Visit: Payer: Self-pay | Admitting: *Deleted

## 2020-12-30 MED ORDER — PREGABALIN 100 MG PO CAPS: 100.0000 mg | ORAL_CAPSULE | Freq: Every day | ORAL | 0 refills | Status: DC

## 2021-01-01 ENCOUNTER — Encounter: Payer: BC Managed Care – PPO | Attending: Physician Assistant | Admitting: Physician Assistant

## 2021-01-01 ENCOUNTER — Other Ambulatory Visit: Payer: Self-pay

## 2021-01-01 DIAGNOSIS — M6281 Muscle weakness (generalized): Secondary | ICD-10-CM | POA: Diagnosis not present

## 2021-01-01 DIAGNOSIS — L89153 Pressure ulcer of sacral region, stage 3: Secondary | ICD-10-CM | POA: Insufficient documentation

## 2021-01-01 DIAGNOSIS — C9001 Multiple myeloma in remission: Secondary | ICD-10-CM | POA: Insufficient documentation

## 2021-01-01 NOTE — Progress Notes (Addendum)
Bruce Smith, Bruce Smith ([id number]) Visit Report for 01/01/2021 Chief Complaint Document Details Patient Name: Bruce Smith, Bruce Smith Date of Service: 01/01/2021 8:45 AM Medical Record Number: [id number] Patient Account Number: [id number] Date of Birth/Sex: Nov 06, 1950 (70 y.o. M) Treating RN: Carlene Coria Primary Care Provider: Gwenyth Allegra Other Clinician: Jeanine Luz Referring Provider: Gwenyth Allegra Treating Provider/Extender: Skipper Cliche in Treatment: 3 Information Obtained from: Patient Chief Complaint Sacral pressure ulcer Electronic Signature(s) Signed: 01/01/2021 9:01:33 AM By: Worthy Keeler PA-C Entered By: Worthy Keeler on 01/01/2021 09:01:33 Bruce Smith, Bruce Smith ([id number]) -------------------------------------------------------------------------------- Debridement Details Patient Name: Bruce Smith Date of Service: 01/01/2021 8:45 AM Medical Record Number: [id number] Patient Account Number: [id number] Date of Birth/Sex: 03-Oct-1951 (70 y.o. M) Treating RN: Carlene Coria Primary Care Provider: Gwenyth Allegra Other Clinician: Jeanine Luz Referring Provider: Gwenyth Allegra Treating Provider/Extender: Skipper Cliche in Treatment: 3 Debridement Performed for Wound #1 Midline Sacrum Assessment: Performed By: Physician Tommie Sams., PA-C Debridement Type: Debridement Level of Consciousness (Pre- Awake and Alert procedure): Pre-procedure Verification/Time Out Yes - 09:05 Taken: Start Time: 09:05 Pain Control: Lidocaine 4% Topical Solution Total Area Debrided (L x W): 1.3 (cm) x 0.9 (cm) = 1.17 (cm) Tissue and other material Viable, Non-Viable, Slough, Subcutaneous, Skin: Dermis , Skin: Epidermis, Slough debrided: Level: Skin/Subcutaneous Tissue Debridement Description: Excisional Instrument: Curette Bleeding: Minimum Hemostasis Achieved: Pressure End Time: 09:08 Procedural Pain: 0 Post Procedural Pain: 0 Response to Treatment: Procedure was tolerated well Level of  Consciousness (Post- Awake and Alert procedure): Post Debridement Measurements of Total Wound Length: (cm) 1.3 Stage: Category/Stage III Width: (cm) 0.9 Depth: (cm) 0.2 Volume: (cm) 0.184 Character of Wound/Ulcer Post Debridement: Improved Post Procedure Diagnosis Same as Pre-procedure Electronic Signature(s) Signed: 01/01/2021 11:30:15 AM By: Worthy Keeler PA-C Signed: 01/06/2021 12:09:52 PM By: Carlene Coria RN Entered By: Carlene Coria on 01/01/2021 09:08:44 Bruce Smith, Bruce Smith ([id number]) -------------------------------------------------------------------------------- HPI Details Patient Name: Bruce Smith Date of Service: 01/01/2021 8:45 AM Medical Record Number: [id number] Patient Account Number: [id number] Date of Birth/Sex: [birth year] (70 y.o. M) Treating RN: Carlene Coria Primary Care Provider: Gwenyth Allegra Other Clinician: Jeanine Luz Referring Provider: Gwenyth Allegra Treating Provider/Extender: Skipper Cliche in Treatment: 3 History of Present Illness HPI Description: 12/11/2020 upon evaluation today patient appears to be doing okay though he does have a wound in the sacral region which is a stage III pressure ulcer that appears to have some slough noted on the base of the wound. He is going require some debridement here today. With that being said I think that he may end up needing some medication of one sort or another to try to help clean this up as well. I discussed that with him today but nonetheless we are going to proceed with some debridement. He does have multiple myeloma and subsequently this is currently still being treated to some degree at least he is scheduled to have a bone marrow transplant shortly and then we will not be coming out for a while in order to try to isolate as much as possible to protect his immune system. He does have some generalized weakness which from him being very sick that is what ended with him actually having this wound in the sacral region  when he was not able to get up and move as easily as he normally had. Fortunately there is no signs of active infection at this time. No fevers, chills, nausea, vomiting, or diarrhea. 12/18/2020 upon evaluation today patient appears to be doing better in regard to his wound. He has  been tolerating the dressing changes without complication. Fortunately I do feel like it is getting a little better from the standpoint of the slough which is somewhat physically buildup on the surface of the wound as well as some of the necrotic tissue in the central portion of the wound. I do feel like it is loosening up with Iodoflex that he did not get his supplies until apparently Wednesday though he did not know that he had gotten them as they have been at appointments at [facility]. Nonetheless I do believe that was delivered which is good news and I do think that the patient hopefully should be able to have this at home to continue with appropriate treatment over the next week or so. After that he will be admitted to the hospital the 21st at [facility] for treatment and will be in the hospital until somewhere around mid March. 01/01/2021 upon evaluation today patient actually appears to be doing pretty well in regard to his wound in my opinion. This actually is showing signs of improvement since the last time I saw him. Overall it appears to be much cleaner and I am extremely happy. There is no evidence that this is try to open up into a deeper wound which is good and overall I think that the new skin around the edges of the wound is a good thing as well. Fortunately there is no signs of active infection at this time. No fever chills noted I do believe he has been doing a good job keeping pressure off of the area. Electronic Signature(s) Signed: 01/01/2021 9:15:29 AM By: Worthy Keeler PA-C Entered By: Worthy Keeler on 01/01/2021 09:15:29 Bruce Smith, Bruce Smith  ([id number]) -------------------------------------------------------------------------------- Physical Exam Details Patient Name: Bruce Smith Date of Service: 01/01/2021 8:45 AM Medical Record Number: [id number] Patient Account Number: [id number] Date of Birth/Sex: 22-Aug-1951 (69 y.o. M) Treating RN: Carlene Coria Primary Care Provider: Gwenyth Allegra Other Clinician: Jeanine Luz Referring Provider: Gwenyth Allegra Treating Provider/Extender: Jeri Cos Weeks in Treatment: 3 Constitutional Well-nourished and well-hydrated in no acute distress. Respiratory normal breathing without difficulty. Psychiatric this patient is able to make decisions and demonstrates good insight into disease process. Alert and Oriented x 3. pleasant and cooperative. Notes Upon inspection patient's wound bed actually showed signs of good granulation at this time. There was minimal slough noted in the base of the wound which is good news. Overall I feel like he is making good progress. I see no signs of infection, no evidence that this is trying to deepen, and overall I feel like he is moving in the right direction. Electronic Signature(s) Signed: 01/01/2021 9:15:53 AM By: Worthy Keeler PA-C Entered By: Worthy Keeler on 01/01/2021 09:15:52 Bruce Smith, Bruce Smith ([id number]) -------------------------------------------------------------------------------- Physician Orders Details Patient Name: Bruce Smith Date of Service: 01/01/2021 8:45 AM Medical Record Number: [id number] Patient Account Number: [id number] Date of Birth/Sex: 03/16/1951 (69 y.o. M) Treating RN: Carlene Coria Primary Care Provider: Gwenyth Allegra Other Clinician: Jeanine Luz Referring Provider: Gwenyth Allegra Treating Provider/Extender: Skipper Cliche in Treatment: 3 Verbal / Phone Orders: No Diagnosis Coding ICD-10 Coding Code Description L89.153 Pressure ulcer of sacral region, stage 3 C90.01 Multiple myeloma in remission M62.81 Muscle weakness  (generalized) Follow-up Appointments o Return Appointment in 2 weeks. Bathing/ Shower/ Hygiene o May shower; gently cleanse wound with antibacterial soap, rinse and pat dry prior to dressing wounds Off-Loading o Turn and reposition every 2 hours o Other: - Protect pressure points with pillows (knees, ankles, elbows). Wound Treatment Wound #  1 - Sacrum Wound Laterality: Midline Cleanser: Soap and Water Every Other Day/30 Days Discharge Instructions: Gently cleanse wound with antibacterial soap, rinse and pat dry prior to dressing wounds Primary Dressing: IODOFLEX 0.9% Cadexomer Iodine Pad (Generic) Every Other Day/30 Days Discharge Instructions: Apply Iodoflex to wound bed only as directed. Secondary Dressing: Bordered Gauze Sterile-HBD 4x4 (in/in) (Generic) Every Other Day/30 Days Discharge Instructions: Cover wound with Bordered Guaze Sterile as directed Electronic Signature(s) Signed: 01/01/2021 11:30:15 AM By: Worthy Keeler PA-C Signed: 01/06/2021 12:09:52 PM By: Carlene Coria RN Entered By: Carlene Coria on 01/01/2021 09:07:39 Bruce Smith, Bruce Smith ([id number]) -------------------------------------------------------------------------------- Problem List Details Patient Name: Bruce Smith Date of Service: 01/01/2021 8:45 AM Medical Record Number: [id number] Patient Account Number: [id number] Date of Birth/Sex: 1950-12-09 (69 y.o. M) Treating RN: Carlene Coria Primary Care Provider: Gwenyth Allegra Other Clinician: Jeanine Luz Referring Provider: Gwenyth Allegra Treating Provider/Extender: Jeri Cos Weeks in Treatment: 3 Active Problems ICD-10 Encounter Code Description Active Date MDM Diagnosis L89.153 Pressure ulcer of sacral region, stage 3 12/11/2020 No Yes C90.01 Multiple myeloma in remission 12/11/2020 No Yes M62.81 Muscle weakness (generalized) 12/11/2020 No Yes Inactive Problems Resolved Problems Electronic Signature(s) Signed: 01/01/2021 9:01:26 AM By: Worthy Keeler  PA-C Entered By: Worthy Keeler on 01/01/2021 09:01:26 Bruce Smith, Bruce Smith ([id number]) -------------------------------------------------------------------------------- Progress Note Details Patient Name: Bruce Smith Date of Service: 01/01/2021 8:45 AM Medical Record Number: [id number] Patient Account Number: [id number] Date of Birth/Sex: [birth year] (69 y.o. M) Treating RN: Carlene Coria Primary Care Provider: Gwenyth Allegra Other Clinician: Jeanine Luz Referring Provider: Gwenyth Allegra Treating Provider/Extender: Skipper Cliche in Treatment: 3 Subjective Chief Complaint Information obtained from Patient Sacral pressure ulcer History of Present Illness (HPI) 12/11/2020 upon evaluation today patient appears to be doing okay though he does have a wound in the sacral region which is a stage III pressure ulcer that appears to have some slough noted on the base of the wound. He is going require some debridement here today. With that being said I think that he may end up needing some medication of one sort or another to try to help clean this up as well. I discussed that with him today but nonetheless we are going to proceed with some debridement. He does have multiple myeloma and subsequently this is currently still being treated to some degree at least he is scheduled to have a bone marrow transplant shortly and then we will not be coming out for a while in order to try to isolate as much as possible to protect his immune system. He does have some generalized weakness which from him being very sick that is what ended with him actually having this wound in the sacral region when he was not able to get up and move as easily as he normally had. Fortunately there is no signs of active infection at this time. No fevers, chills, nausea, vomiting, or diarrhea. 12/18/2020 upon evaluation today patient appears to be doing better in regard to his wound. He has been tolerating the dressing changes  without complication. Fortunately I do feel like it is getting a little better from the standpoint of the slough which is somewhat physically buildup on the surface of the wound as well as some of the necrotic tissue in the central portion of the wound. I do feel like it is loosening up with Iodoflex that he did not get his supplies until apparently Wednesday though he did not know that he had gotten them as they have been at appointments at  [facility]. Nonetheless I do believe that was delivered which is good news and I do think that the patient hopefully should be able to have this at home to continue with appropriate treatment over the next week or so. After that he will be admitted to the hospital the 21st at [facility] for treatment and will be in the hospital until somewhere around mid March. 01/01/2021 upon evaluation today patient actually appears to be doing pretty well in regard to his wound in my opinion. This actually is showing signs of improvement since the last time I saw him. Overall it appears to be much cleaner and I am extremely happy. There is no evidence that this is try to open up into a deeper wound which is good and overall I think that the new skin around the edges of the wound is a good thing as well. Fortunately there is no signs of active infection at this time. No fever chills noted I do believe he has been doing a good job keeping pressure off of the area. Objective Constitutional Well-nourished and well-hydrated in no acute distress. Vitals Time Taken: 8:52 AM, Height: 68 in, Weight: 160 lbs, BMI: 24.3, Temperature: 98.1 F, Pulse: 101 bpm, Respiratory Rate: 18 breaths/min, Blood Pressure: 109/69 mmHg. Respiratory normal breathing without difficulty. Psychiatric this patient is able to make decisions and demonstrates good insight into disease process. Alert and Oriented x 3. pleasant and cooperative. General Notes: Upon inspection patient's wound bed actually showed signs of  good granulation at this time. There was minimal slough noted in the base of the wound which is good news. Overall I feel like he is making good progress. I see no signs of infection, no evidence that this is trying to deepen, and overall I feel like he is moving in the right direction. Integumentary (Hair, Skin) Wound #1 status is Open. Original cause of wound was Pressure Injury. The date acquired was: 12/02/2020. The wound has been in treatment 3 weeks. The wound is located on the Midline Sacrum. The wound measures 1.3cm length x 0.9cm width x 0.2cm depth; 0.919cm^2 area and 0.184cm^3 volume. There is Fat Layer (Subcutaneous Tissue) exposed. There is no tunneling or undermining noted. There is a medium amount of serosanguineous drainage noted. The wound margin is flat and intact. There is small (1-33%) pink granulation within the wound bed. There is a medium (34-66%) amount of necrotic tissue within the wound bed including Adherent Slough. Bruce Smith, Bruce Smith ([id number]) Assessment Active Problems ICD-10 Pressure ulcer of sacral region, stage 3 Multiple myeloma in remission Muscle weakness (generalized) Procedures Wound #1 Pre-procedure diagnosis of Wound #1 is a Pressure Ulcer located on the Midline Sacrum . There was a Excisional Skin/Subcutaneous Tissue Debridement with a total area of 1.17 sq cm performed by Tommie Sams., PA-C. With the following instrument(s): Curette to remove Viable and Non-Viable tissue/material. Material removed includes Subcutaneous Tissue, Slough, Skin: Dermis, and Skin: Epidermis after achieving pain control using Lidocaine 4% Topical Solution. No specimens were taken. A time out was conducted at 09:05, prior to the start of the procedure. A Minimum amount of bleeding was controlled with Pressure. The procedure was tolerated well with a pain level of 0 throughout and a pain level of 0 following the procedure. Post Debridement Measurements: 1.3cm length x 0.9cm width x  0.2cm depth; 0.184cm^3 volume. Post debridement Stage noted as Category/Stage III. Character of Wound/Ulcer Post Debridement is improved. Post procedure Diagnosis Wound #1: Same as Pre-Procedure Plan Follow-up Appointments: Return Appointment in  2 weeks. Bathing/ Shower/ Hygiene: May shower; gently cleanse wound with antibacterial soap, rinse and pat dry prior to dressing wounds Off-Loading: Turn and reposition every 2 hours Other: - Protect pressure points with pillows (knees, ankles, elbows). WOUND #1: - Sacrum Wound Laterality: Midline Cleanser: Soap and Water Every Other Day/30 Days Discharge Instructions: Gently cleanse wound with antibacterial soap, rinse and pat dry prior to dressing wounds Primary Dressing: IODOFLEX 0.9% Cadexomer Iodine Pad (Generic) Every Other Day/30 Days Discharge Instructions: Apply Iodoflex to wound bed only as directed. Secondary Dressing: Bordered Gauze Sterile-HBD 4x4 (in/in) (Generic) Every Other Day/30 Days Discharge Instructions: Cover wound with Bordered Guaze Sterile as directed 1. Would recommend currently that we going continue with the Iodoflex I think that is doing a good job although he may be ready to switch to collagen soon. 2. I am also can recommend we have the patient continue to offload and keep pressure off of the sacral region I think that is important. 3. 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 Nonetheless it may slow down his healing but again right now we are not seeing any evidence of anything worsening. 4. With regard to the hospital stay following this procedure I do think he needs to have an air mattress during that time and I discussed that to with the wife today and she is good to mention that to the hospital where the doctors rather. I think that will help prevent the sacral wound from getting any worse. We will see patient back for reevaluation in  2 weeks here in the clinic. If anything worsens or changes patient will contact our office for additional recommendations. Electronic Signature(s) Signed: 01/01/2021 9:19:55 AM By: Worthy Keeler PA-C Entered By: Worthy Keeler on 01/01/2021 09:19:55 Bruce Smith, Bruce Smith ([id number]) -------------------------------------------------------------------------------- SuperBill Details Patient Name: Bruce Smith Date of Service: 01/01/2021 Medical Record Number: [id number] Patient Account Number: [id number] Date of Birth/Sex: 19-Oct-1951 (69 y.o. M) Treating RN: Carlene Coria Primary Care Provider: Gwenyth Allegra Other Clinician: Jeanine Luz Referring Provider: Gwenyth Allegra Treating Provider/Extender: Skipper Cliche in Treatment: 3 Diagnosis Coding ICD-10 Codes Code Description [phone] Pressure ulcer of sacral region, stage 3 C90.01 Multiple myeloma in remission M62.81 Muscle weakness (generalized) Facility Procedures CPT4 Code: [id number] Description: 47207 - DEB SUBQ TISSUE 20 SQ CM/< Modifier: Quantity: 1 CPT4 Code: Description: ICD-10 Diagnosis Description L89.153 Pressure ulcer of sacral region, stage 3 Modifier: Quantity: Physician Procedures CPT4 Code: [id number] Description: 11042 - WC PHYS SUBQ TISS 20 SQ CM Modifier: Quantity: 1 CPT4 Code: Description: ICD-10 Diagnosis Description L89.153 Pressure ulcer of sacral region, stage 3 Modifier: Quantity: Electronic Signature(s) Signed: 01/01/2021 9:20:01 AM By: Worthy Keeler PA-C Entered By: Worthy Keeler on 01/01/2021 09:20:01

## 2021-01-06 NOTE — Progress Notes (Addendum)
Bruce Smith ([id number]) Visit Report for 01/01/2021 Arrival Information Details Patient Name: Bruce Smith, Bruce Smith Date of Service: 01/01/2021 8:45 AM Medical Record Number: [id number] Patient Account Number: [id number] Date of Birth/Sex: [birth year] (70 y.o. M) Treating RN: Carlene Coria Primary Care Ting Cage: Gwenyth Allegra Other Clinician: Jeanine Luz Referring Donato Studley: Gwenyth Allegra Treating Hershey Knauer/Extender: Skipper Cliche in Treatment: 3 Visit Information History Since Last Visit Added or deleted any medications: No Patient Arrived: Ambulatory Had a fall or experienced change in No Arrival Time: 08:45 activities of daily living that may affect Accompanied By: wife risk of falls: Transfer Assistance: None Hospitalized since last visit: No Patient Identification Verified: Yes Pain Present Now: Yes Secondary Verification Process Completed: Yes Patient Has Alerts: Yes Patient Alerts: Not Diabetic Electronic Signature(s) Signed: 01/04/2021 1:04:35 PM By: Jeanine Luz Entered By: Jeanine Luz on 01/01/2021 08:49:08 Hauck, Itzae ([id number]) -------------------------------------------------------------------------------- Clinic Level of Care Assessment Details Patient Name: Bruce Smith Date of Service: 01/01/2021 8:45 AM Medical Record Number: [id number] Patient Account Number: [id number] Date of Birth/Sex: 06-24-1951 (69 y.o. M) Treating RN: Carlene Coria Primary Care Fredia Chittenden: Gwenyth Allegra Other Clinician: Jeanine Luz Referring Kinsly Hild: Gwenyth Allegra Treating Keni Wafer/Extender: Skipper Cliche in Treatment: 3 Clinic Level of Care Assessment Items TOOL 1 Quantity Score []  - Use when EandM and Procedure is performed on INITIAL visit 0 ASSESSMENTS - Nursing Assessment / Reassessment []  - General Physical Exam (combine w/ comprehensive assessment (listed just below) when performed on new 0 pt. evals) []  - 0 Comprehensive Assessment (HX, ROS, Risk Assessments, Wounds Hx,  etc.) ASSESSMENTS - Wound and Skin Assessment / Reassessment []  - Dermatologic / Skin Assessment (not related to wound area) 0 ASSESSMENTS - Ostomy and/or Continence Assessment and Care []  - Incontinence Assessment and Management 0 []  - 0 Ostomy Care Assessment and Management (repouching, etc.) PROCESS - Coordination of Care []  - Simple Patient / Family Education for ongoing care 0 []  - 0 Complex (extensive) Patient / Family Education for ongoing care []  - 0 Staff obtains Programmer, systems, Records, Test Results / Process Orders []  - 0 Staff telephones HHA, Nursing Homes / Clarify orders / etc []  - 0 Routine Transfer to another Facility (non-emergent condition) []  - 0 Routine Hospital Admission (non-emergent condition) []  - 0 New Admissions / Biomedical engineer / Ordering NPWT, Apligraf, etc. []  - 0 Emergency Hospital Admission (emergent condition) PROCESS - Special Needs []  - Pediatric / Minor Patient Management 0 []  - 0 Isolation Patient Management []  - 0 Hearing / Language / Visual special needs []  - 0 Assessment of Community assistance (transportation, D/C planning, etc.) []  - 0 Additional assistance / Altered mentation []  - 0 Support Surface(s) Assessment (bed, cushion, seat, etc.) INTERVENTIONS - Miscellaneous []  - External ear exam 0 []  - 0 Patient Transfer (multiple staff / Civil Service fast streamer / Similar devices) []  - 0 Simple Staple / Suture removal (25 or less) []  - 0 Complex Staple / Suture removal (26 or more) []  - 0 Hypo/Hyperglycemic Management (do not check if billed separately) []  - 0 Ankle / Brachial Index (ABI) - do not check if billed separately Has the patient been seen at the hospital within the last three years: Yes Total Score: 0 Level Of Care: ____ Bruce Smith ([id number]) Electronic Signature(s) Signed: 01/06/2021 12:09:52 PM By: Carlene Coria RN Entered By: Carlene Coria on 01/01/2021 09:08:55 Campisi, Colin  ([id number]) -------------------------------------------------------------------------------- Multi Wound Chart Details Patient Name: Bruce Smith Date of Service: 01/01/2021 8:45 AM Medical Record Number: [id number] Patient Account Number: [id number] Date of Birth/Sex:  1951-04-25 (70 y.o. M) Treating RN: Carlene Coria Primary Care Bora Broner: Gwenyth Allegra Other Clinician: Jeanine Luz Referring Tenita Cue: Gwenyth Allegra Treating Gustie Bobb/Extender: Skipper Cliche in Treatment: 3 Vital Signs Height(in): 68 Pulse(bpm): 101 Weight(lbs): 160 Blood Pressure(mmHg): 109/69 Body Mass Index(BMI): 24 Temperature(F): 98.1 Respiratory Rate(breaths/min): 18 Photos: [N/A:N/A] Wound Location: Midline Sacrum N/A N/A Wounding Event: Pressure Injury N/A N/A Primary Etiology: Pressure Ulcer N/A N/A Comorbid History: Coronary Artery Disease, N/A N/A Hypertension, Myocardial Infarction, History of pressure wounds, Neuropathy, Received Chemotherapy, Received Radiation Date Acquired: 12/02/2020 N/A N/A Weeks of Treatment: 3 N/A N/A Wound Status: Open N/A N/A Measurements L x W x D (cm) 1.3x0.9x0.2 N/A N/A Area (cm) : 0.919 N/A N/A Volume (cm) : 0.184 N/A N/A % Reduction in Area: -95.10% N/A N/A % Reduction in Volume: -95.70% N/A N/A Classification: Category/Stage III N/A N/A Exudate Amount: Medium N/A N/A Exudate Type: Serosanguineous N/A N/A Exudate Color: red, brown N/A N/A Wound Margin: Flat and Intact N/A N/A Granulation Amount: Small (1-33%) N/A N/A Granulation Quality: Pink N/A N/A Necrotic Amount: Medium (34-66%) N/A N/A Exposed Structures: Fat Layer (Subcutaneous Tissue): N/A N/A Yes Fascia: No Tendon: No Muscle: No Joint: No Bone: No Epithelialization: None N/A N/A Treatment Notes Electronic Signature(s) Signed: 01/06/2021 12:09:52 PM By: Carlene Coria RN Entered By: Carlene Coria on 01/01/2021 09:05:58 Perman, Abdi  ([id number]) -------------------------------------------------------------------------------- Multi-Disciplinary Care Plan Details Patient Name: Bruce Smith Date of Service: 01/01/2021 8:45 AM Medical Record Number: [id number] Patient Account Number: [id number] Date of Birth/Sex: September 03, 1951 (69 y.o. M) Treating RN: Carlene Coria Primary Care Deshanae Lindo: Gwenyth Allegra Other Clinician: Jeanine Luz Referring Abraham Entwistle: Gwenyth Allegra Treating Amayiah Gosnell/Extender: Skipper Cliche in Treatment: 3 Active Inactive Electronic Signature(s) Signed: 01/25/2021 3:53:35 PM By: Gretta Cool, BSN, RN, CWS, Kim RN, BSN Signed: 02/05/2021 8:11:40 AM By: Carlene Coria RN Previous Signature: 01/06/2021 12:09:52 PM Version By: Carlene Coria RN Entered By: Gretta Cool BSN, RN, CWS, Kim on 01/25/2021 15:53:35 Harper, Luccas ([id number]) -------------------------------------------------------------------------------- Pain Assessment Details Patient Name: Bruce Smith Date of Service: 01/01/2021 8:45 AM Medical Record Number: [id number] Patient Account Number: [id number] Date of Birth/Sex: 03-Feb-1951 (69 y.o. M) Treating RN: Carlene Coria Primary Care Bronsyn Shappell: Gwenyth Allegra Other Clinician: Jeanine Luz Referring Corrion Stirewalt: Gwenyth Allegra Treating Vilas Edgerly/Extender: Skipper Cliche in Treatment: 3 Active Problems Location of Pain Severity and Description of Pain Patient Has Paino Yes Site Locations Rate the pain. Current Pain Level: 10 Least Pain Level: 5 Pain Management and Medication Current Pain Management: Electronic Signature(s) Signed: 01/04/2021 1:04:35 PM By: Jeanine Luz Signed: 01/06/2021 12:09:52 PM By: Carlene Coria RN Entered By: Jeanine Luz on 01/01/2021 08:53:51 Majano, Ezekeil ([id number]) -------------------------------------------------------------------------------- Patient/Caregiver Education Details Patient Name: Bruce Smith Date of Service: 01/01/2021 8:45 AM Medical Record Number: [id number] Patient  Account Number: [id number] Date of Birth/Gender: 1951-03-24 (70 y.o. M) Treating RN: Carlene Coria Primary Care Physician: Gwenyth Allegra Other Clinician: Jeanine Luz Referring Physician: Gwenyth Allegra Treating Physician/Extender: Skipper Cliche in Treatment: 3 Education Assessment Education Provided To: Patient Education Topics Provided Wound/Skin Impairment: Methods: Explain/Verbal Responses: State content correctly Electronic Signature(s) Signed: 01/06/2021 12:09:52 PM By: Carlene Coria RN Entered By: Carlene Coria on 01/01/2021 09:09:11 [place], [place] ([id number]) -------------------------------------------------------------------------------- Wound Assessment Details Patient Name: Bruce Smith Date of Service: 01/01/2021 8:45 AM Medical Record Number: [id number] Patient Account Number: [id number] Date of Birth/Sex: November 25, 1950 (69 y.o. M) Treating RN: Carlene Coria Primary Care Makinsey Pepitone: Gwenyth Allegra Other Clinician: Jeanine Luz Referring Ricky Gallery: Gwenyth Allegra Treating Elazar Argabright/Extender: Jeri Cos Weeks in Treatment: 3 Wound Status Wound Number: 1 Primary  Pressure Ulcer Etiology: Wound Location: Midline Sacrum Wound Open Wounding Event: Pressure Injury Status: Date Acquired: 12/02/2020 Comorbid Coronary Artery Disease, Hypertension, Myocardial Weeks Of Treatment: 3 History: Infarction, History of pressure wounds, Neuropathy, Clustered Wound: No Received Chemotherapy, Received Radiation Photos Wound Measurements Length: (cm) 1.3 Width: (cm) 0.9 Depth: (cm) 0.2 Area: (cm) 0.919 Volume: (cm) 0.184 % Reduction in Area: -95.1% % Reduction in Volume: -95.7% Epithelialization: None Tunneling: No Undermining: No Wound Description Classification: Category/Stage III Wound Margin: Flat and Intact Exudate Amount: Medium Exudate Type: Serosanguineous Exudate Color: red, brown Foul Odor After Cleansing: No Slough/Fibrino Yes Wound Bed Granulation Amount: Small  (1-33%) Exposed Structure Granulation Quality: Pink Fascia Exposed: No Necrotic Amount: Medium (34-66%) Fat Layer (Subcutaneous Tissue) Exposed: Yes Necrotic Quality: Adherent Slough Tendon Exposed: No Muscle Exposed: No Joint Exposed: No Bone Exposed: No Electronic Signature(s) Signed: 01/04/2021 1:04:35 PM By: Jeanine Luz Signed: 01/06/2021 12:09:52 PM By: Carlene Coria RN Entered By: Jeanine Luz on 01/01/2021 08:59:42 Garfinkel, Markham ([id number]) -------------------------------------------------------------------------------- Vitals Details Patient Name: Bruce Smith Date of Service: 01/01/2021 8:45 AM Medical Record Number: [id number] Patient Account Number: [id number] Date of Birth/Sex: 1951-04-22 (70 y.o. M) Treating RN: Carlene Coria Primary Care Altamese Deguire: Gwenyth Allegra Other Clinician: Jeanine Luz Referring Billyjoe Go: Gwenyth Allegra Treating Rondalyn Belford/Extender: Skipper Cliche in Treatment: 3 Vital Signs Time Taken: 08:52 Temperature (F): 98.1 Height (in): 68 Pulse (bpm): 101 Weight (lbs): 160 Respiratory Rate (breaths/min): 18 Body Mass Index (BMI): 24.3 Blood Pressure (mmHg): 109/69 Reference Range: 80 - 120 mg / dl Electronic Signature(s) Signed: 01/04/2021 1:04:35 PM By: Jeanine Luz Entered By: Jeanine Luz on 01/01/2021 08:52:37

## 2021-01-15 ENCOUNTER — Ambulatory Visit: Payer: BC Managed Care – PPO | Admitting: Physician Assistant

## 2021-01-29 ENCOUNTER — Ambulatory Visit: Payer: BC Managed Care – PPO | Admitting: Physician Assistant

## 2021-02-10 ENCOUNTER — Telehealth: Payer: Self-pay

## 2021-02-10 DIAGNOSIS — C9000 Multiple myeloma not having achieved remission: Secondary | ICD-10-CM

## 2021-02-10 NOTE — Telephone Encounter (Signed)
PA from [facility] called stating she is discharging patient today and wanted to have patient seen by Dr.ennever early next week. Scheduled patient for Monday 4/18 with Dr.Ennever. she will fax over discharge notes and lab orders they want drawn. Informed Dr.Ennever and gave him report from PA.  - Patient had transplant on 25th, did okay and then hstarted having GI toxicities, CT showed mild colitis, they treated with IV abx in hospital. Patient started feeling better but still has mild nausea and is being treated with antiemetics. Patient was complaining of chronic pain, they increased his lyrica to 10mg  in am and 150mg  in PM. He was being followed by palliative in hospital and they send referral to palliative at home and are working on in home PT/OT.  Dr.Ennever  was okay with patient coming in Monday 4/18 and additional lab orders from him placed. Patient is aware of appointment.

## 2021-02-11 ENCOUNTER — Other Ambulatory Visit: Payer: Self-pay | Admitting: *Deleted

## 2021-02-11 ENCOUNTER — Telehealth: Payer: Self-pay

## 2021-02-11 DIAGNOSIS — E860 Dehydration: Secondary | ICD-10-CM

## 2021-02-11 DIAGNOSIS — C9000 Multiple myeloma not having achieved remission: Secondary | ICD-10-CM

## 2021-02-11 DIAGNOSIS — R110 Nausea: Secondary | ICD-10-CM

## 2021-02-11 NOTE — Telephone Encounter (Signed)
Spoke with patient's wife Maudie Mercury and scheduled an in-person Palliative Consult for 02/24/21 @ [place] screening was negative. No pets in home. Patient lives with wife Maudie Mercury.   Consent obtained; updated Outlook/Netsmart/Team List and Epic.  Family is aware they may be receiving a call from NP the day before or day of to confirm appointment.

## 2021-02-15 ENCOUNTER — Inpatient Hospital Stay: Payer: BC Managed Care – PPO

## 2021-02-15 ENCOUNTER — Encounter: Payer: Self-pay | Admitting: Hematology & Oncology

## 2021-02-15 ENCOUNTER — Inpatient Hospital Stay: Payer: BC Managed Care – PPO | Attending: Hematology & Oncology | Admitting: Hematology & Oncology

## 2021-02-15 ENCOUNTER — Telehealth: Payer: Self-pay | Admitting: *Deleted

## 2021-02-15 ENCOUNTER — Other Ambulatory Visit: Payer: Self-pay

## 2021-02-15 ENCOUNTER — Other Ambulatory Visit: Payer: Self-pay | Admitting: Hematology & Oncology

## 2021-02-15 ENCOUNTER — Telehealth: Payer: Self-pay

## 2021-02-15 VITALS — BP 72/44 | HR 92 | Temp 99.0°F | Resp 18

## 2021-02-15 DIAGNOSIS — C9000 Multiple myeloma not having achieved remission: Secondary | ICD-10-CM | POA: Insufficient documentation

## 2021-02-15 DIAGNOSIS — R112 Nausea with vomiting, unspecified: Secondary | ICD-10-CM | POA: Diagnosis not present

## 2021-02-15 DIAGNOSIS — E860 Dehydration: Secondary | ICD-10-CM

## 2021-02-15 DIAGNOSIS — X58XXXA Exposure to other specified factors, initial encounter: Secondary | ICD-10-CM | POA: Diagnosis not present

## 2021-02-15 DIAGNOSIS — R5383 Other fatigue: Secondary | ICD-10-CM | POA: Insufficient documentation

## 2021-02-15 DIAGNOSIS — S42102A Fracture of unspecified part of scapula, left shoulder, initial encounter for closed fracture: Secondary | ICD-10-CM | POA: Insufficient documentation

## 2021-02-15 DIAGNOSIS — R110 Nausea: Secondary | ICD-10-CM

## 2021-02-15 DIAGNOSIS — R531 Weakness: Secondary | ICD-10-CM | POA: Insufficient documentation

## 2021-02-15 DIAGNOSIS — Z9484 Stem cells transplant status: Secondary | ICD-10-CM | POA: Diagnosis not present

## 2021-02-15 DIAGNOSIS — C419 Malignant neoplasm of bone and articular cartilage, unspecified: Secondary | ICD-10-CM

## 2021-02-15 DIAGNOSIS — M899 Disorder of bone, unspecified: Secondary | ICD-10-CM | POA: Insufficient documentation

## 2021-02-15 DIAGNOSIS — Z79899 Other long term (current) drug therapy: Secondary | ICD-10-CM | POA: Insufficient documentation

## 2021-02-15 LAB — COMPREHENSIVE METABOLIC PANEL
ALT: 9 U/L (ref 0–44)
AST: 13 U/L — ABNORMAL LOW (ref 15–41)
Albumin: 2.7 g/dL — ABNORMAL LOW (ref 3.5–5.0)
Alkaline Phosphatase: 90 U/L (ref 38–126)
Anion gap: 7 (ref 5–15)
BUN: 5 mg/dL — ABNORMAL LOW (ref 8–23)
CO2: 24 mmol/L (ref 22–32)
Calcium: 7.5 mg/dL — ABNORMAL LOW (ref 8.9–10.3)
Chloride: 104 mmol/L (ref 98–111)
Creatinine, Ser: 0.46 mg/dL — ABNORMAL LOW (ref 0.61–1.24)
GFR, Estimated: >60 mL/min (ref >60)
Glucose, Bld: 116 mg/dL — ABNORMAL HIGH (ref 70–99)
Potassium: 3.3 mmol/L — ABNORMAL LOW (ref 3.5–5.1)
Sodium: 135 mmol/L (ref 135–145)
Total Bilirubin: 0.4 mg/dL (ref 0.3–1.2)
Total Protein: 4.3 g/dL — ABNORMAL LOW (ref 6.5–8.1)

## 2021-02-15 LAB — CBC WITH DIFFERENTIAL (CANCER CENTER ONLY)
Abs Immature Granulocytes: 0.05 10*3/uL (ref 0.00–0.07)
Basophils Absolute: 0 10*3/uL (ref 0.0–0.1)
Basophils Relative: 0 %
Eosinophils Absolute: 0 10*3/uL (ref 0.0–0.5)
Eosinophils Relative: 0 %
HCT: 30.6 % — ABNORMAL LOW (ref 39.0–52.0)
Hemoglobin: 10.5 g/dL — ABNORMAL LOW (ref 13.0–17.0)
Immature Granulocytes: 2 %
Lymphocytes Relative: 3 %
Lymphs Abs: 0.1 10*3/uL — ABNORMAL LOW (ref 0.7–4.0)
MCH: 31.4 pg (ref 26.0–34.0)
MCHC: 34.3 g/dL (ref 30.0–36.0)
MCV: 91.6 fL (ref 80.0–100.0)
Monocytes Absolute: 0.5 10*3/uL (ref 0.1–1.0)
Monocytes Relative: 13 %
Neutro Abs: 2.8 10*3/uL (ref 1.7–7.7)
Neutrophils Relative %: 82 %
Platelet Count: 228 10*3/uL (ref 150–400)
RBC: 3.34 MIL/uL — ABNORMAL LOW (ref 4.22–5.81)
RDW: 15 % (ref 11.5–15.5)
WBC Count: 3.4 10*3/uL — ABNORMAL LOW (ref 4.0–10.5)
nRBC: 0 % (ref 0.0–0.2)

## 2021-02-15 LAB — TYPE AND SCREEN
ABO/RH(D): O POS
Antibody Screen: POSITIVE

## 2021-02-15 LAB — MAGNESIUM: Magnesium: 1.7 mg/dL (ref 1.7–2.4)

## 2021-02-15 LAB — LACTATE DEHYDROGENASE: LDH: 242 U/L — ABNORMAL HIGH (ref 98–192)

## 2021-02-15 MED ORDER — SODIUM CHLORIDE 0.9 % IV SOLN
150.0000 mg | Freq: Once | INTRAVENOUS | Status: DC
  Filled 2021-02-15: qty 5

## 2021-02-15 MED ORDER — POTASSIUM CHLORIDE CRYS ER 20 MEQ PO TBCR
40.0000 meq | EXTENDED_RELEASE_TABLET | Freq: Two times a day (BID) | ORAL | Status: DC
  Administered 2021-02-15: 40 meq via ORAL

## 2021-02-15 MED ORDER — SODIUM CHLORIDE 0.9 % IV SOLN
INTRAVENOUS | Status: AC
Start: 2021-02-15 — End: 2021-02-15
  Filled 2021-02-15 (×2): qty 250

## 2021-02-15 MED ORDER — SODIUM CHLORIDE 0.9 % IV SOLN
2.0000 g | Freq: Once | INTRAVENOUS | Status: AC
  Administered 2021-02-15: 2 g via INTRAVENOUS
  Filled 2021-02-15: qty 20

## 2021-02-15 NOTE — Telephone Encounter (Signed)
S/w pt wife and she is aware of the appt 4/20 per sch message  Bruce Smith

## 2021-02-15 NOTE — Telephone Encounter (Signed)
Message received from patient's wife Bruce Smith wanting to know what dose of oral potassium pt should be on at home and if pt should come back on Wednesday for IV fluids.  Call placed back to [facility] and message left to notify Bruce Smith per order of Dr. [name] that pt does not need to take any oral potassium at home at this time and that Dr. [name] would like for pt to come back on Wednesday, 02/17/21 for repeat lab work and IVF's. Instructed Kim to call back with any questions.  Message sent to scheduling.

## 2021-02-15 NOTE — Progress Notes (Signed)
Hematology and Oncology Follow Up Visit  Bruce Smith [id number] 1951-06-18 70 y.o. 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  He did go to [facility] and had a stem cell transplant.  He had this on 01/22/2021.  He still is having a lot of issues.  He still is dehydrated.  He still having pain problems.  He still has nausea.  No matter what we have tried her nausea and nothing has worked.  When he was at [facility], he did have neutropenic colitis.  He has had no bleeding.  He has had some diarrhea which is occasional.  He has had no rashes.  He has had no leg swelling.  He is still hard for him to get around.  There has been no fever.  He has had no problems with COVID.  He has had no headache.  He clearly needs some IV fluids.  His calcium is low.  His potassium is low.  I am trying to give him some Emend in the office.  This.  I have helped his nausea.  Unfortunately, insurance will not adjust to this.  There has been no problems with fever.  He has had no urinary issues.  Bruce Smith does have a little bit of dizziness.  Hopefully, he will be able to go back out to [facility] in a couple weeks or so.  Currently, his performance status is ECOG 2.    Medications:  Current Outpatient Medications:  .  HYDROmorphone (DILAUDID) 4 MG tablet, Take one (1) tablet every 8 hours and 1 tablet 3 times daily as needed for pain, Disp: , Rfl:  .  lisinopril (ZESTRIL) 5 MG tablet, Take by mouth., Disp: , Rfl:  .  Oxycodone  HCl 10 MG TABS, Take 1 tablet by oral route every 6 hours as needed for pain., Disp: , Rfl:  .  promethazine (PHENERGAN) 12.5 MG tablet, Take by mouth., Disp: , Rfl:  .  acetaminophen (TYLENOL) 500 MG tablet, Take 1,000 mg by mouth daily as needed., Disp: , Rfl:  .  acyclovir (ZOVIRAX) 400 MG tablet, Take 400 mg by mouth 2 (two) times daily., Disp: , Rfl:  .  aspirin 81 MG EC tablet, Take 81 mg by mouth daily. Swallow whole., Disp: , Rfl:  .  atorvastatin (LIPITOR) 40 MG tablet, Take 20 mg by mouth daily., Disp: , Rfl:  .  dexamethasone (DECADRON) 4 MG tablet, Take 4 mg by mouth daily. Take one tablet for 3 days after chemotherapy., Disp: , Rfl:  .  dronabinol (MARINOL) 5 MG capsule, Take 5 mg by mouth daily as needed (nausea)., Disp: , Rfl:  .  ergocalciferol (VITAMIN D2) 1.25 MG (50000 UT) capsule, Take by mouth once a week., Disp: , Rfl:  .  famciclovir (FAMVIR) 500 MG tablet, Take 1 tablet (500 mg total) by mouth daily., Disp: 30 tablet, Rfl: 8 .  fentaNYL 37.5 MCG/HR PT72, Place onto the  skin., Disp: , Rfl:  .  FLOVENT HFA 220 MCG/ACT inhaler, Inhale into the lungs., Disp: , Rfl:  .  LORazepam (ATIVAN) 0.5 MG tablet, Take 1 tablet (0.5 mg total) by mouth every 6 (six) hours as needed for anxiety (nausea)., Disp: 40 tablet, Rfl: 0 .  megestrol (MEGACE ES) 625 MG/5ML suspension, Take 5 mLs (625 mg total) by mouth daily., Disp: 150 mL, Rfl: 4 .  metoCLOPramide (REGLAN) 10 MG tablet, Take 1 tablet (10 mg total) by mouth 4 (four) times daily -  before meals and at bedtime. (Patient taking differently: Take 10 mg by mouth 2 (two) times daily.), Disp: 120 tablet, Rfl: 3 .  metoprolol succinate (TOPROL-XL) 50 MG 24 hr tablet, Take 50 mg by mouth daily., Disp: , Rfl:  .  naloxone (NARCAN) nasal spray 4 mg/0.1 mL, Place 1 spray into the nose once., Disp: , Rfl:  .  nitroGLYCERIN (NITROSTAT) 0.4 MG SL tablet, Place 0.4 mg under the tongue every 5 (five) minutes as needed for chest pain. (Patient not  taking: Reported on 12/09/2020), Disp: , Rfl:  .  NIVESTYM 480 MCG/0.8ML SOSY injection, Inject into the skin., Disp: , Rfl:  .  OLANZapine (ZYPREXA) 10 MG tablet, Take 1 tablet (10 mg total) by mouth at bedtime., Disp: 30 tablet, Rfl: 2 .  omeprazole (PRILOSEC) 40 MG capsule, Take 40 mg by mouth daily., Disp: , Rfl:  .  ondansetron (ZOFRAN) 8 MG tablet, TAKE 1 TABLET BY MOUTH EVERY 8 HOURS AS NEEDED NAUSEA AND VOMITING, Disp: 20 tablet, Rfl: 1 .  ondansetron (ZOFRAN-ODT) 8 MG disintegrating tablet, Take 8 mg by mouth 3 (three) times daily., Disp: , Rfl:  .  polyethylene glycol (MIRALAX) 17 g packet, Take 17 g by mouth daily., Disp: 30 each, Rfl: 1 .  prasugrel (EFFIENT) 10 MG TABS tablet, Take 10 mg by mouth daily., Disp: , Rfl:  .  pregabalin (LYRICA) 100 MG capsule, Take 1 capsule (100 mg total) by mouth daily., Disp: 60 capsule, Rfl: 0 .  sennosides-docusate sodium (SENOKOT-S) 8.6-50 MG tablet, Take 2 tablets by mouth daily., Disp: 30 tablet, Rfl: 1 .  silver sulfADIAZINE (SILVADENE) 1 % cream, Apply 1 application topically 2 (two) times daily. (Patient taking differently: Apply 1 application topically 2 (two) times daily as needed (bed sores).), Disp: 50 g, Rfl: 0 .  tamsulosin (FLOMAX) 0.4 MG CAPS capsule, Take 0.4 mg by mouth at bedtime., Disp: , Rfl:   Current Facility-Administered Medications:  .  fosaprepitant (EMEND) 150 mg in sodium chloride 0.9 % 145 mL IVPB, 150 mg, Intravenous, Once, Bruce Smith R, MD .  potassium chloride SA (KLOR-CON) CR tablet 40 mEq, 40 mEq, Oral, BID, Volanda Napoleon, MD, 40 mEq at 02/15/21 1035  Allergies: No Known Allergies  Past Medical History, Surgical history, Social history, and Family History were reviewed and updated.  Review of Systems: Review of Systems  Constitutional: Negative.   HENT:  Negative.   Eyes: Negative.   Respiratory: Negative.   Cardiovascular: Negative.   Gastrointestinal: Negative.   Endocrine: Negative.   Genitourinary:  Negative.    Musculoskeletal: Positive for arthralgias.  Skin: Negative.   Neurological: Negative.   Hematological: Negative.   Psychiatric/Behavioral: Negative.     Physical Exam:  oral temperature is 99 F (37.2 C). His blood pressure is 72/44 (abnormal) and his pulse is 92. His respiration is 18 and oxygen saturation is 95%.   Wt Readings from Last 3 Encounters:  12/09/20 158 lb (71.7 kg)  12/03/20 162 lb (73.5 kg)  11/03/20 169 lb (76.7 kg)    Physical Exam Vitals reviewed.  HENT:     Head: Normocephalic and atraumatic.  Eyes:     Pupils: Pupils are equal, round, and reactive to light.  Cardiovascular:     Rate and Rhythm: Normal rate and regular rhythm.     Heart sounds: Normal heart sounds.  Pulmonary:     Effort: Pulmonary effort is normal.     Breath sounds: Normal breath sounds.  Abdominal:     General: Bowel sounds are normal.     Palpations: Abdomen is soft.  Musculoskeletal:        General: No tenderness or deformity. Normal range of motion.     Cervical back: Normal range of motion.     Comments: He has the left hip surgical scar that is healed.  He has some decreased range of motion of the left hip area.  He has some discomfort in the left shoulder blade.  He does seem to have little bit of better range of motion in this area.  Lymphadenopathy:     Cervical: No cervical adenopathy.  Skin:    General: Skin is warm and dry.     Findings: No erythema or rash.  Neurological:     Mental Status: He is alert and oriented to person, place, and time.  Psychiatric:        Behavior: Behavior normal.        Thought Content: Thought content normal.        Judgment: Judgment normal.    Lab Results  Component Value Date   WBC 3.4 (L) 02/15/2021   HGB 10.5 (L) 02/15/2021   HCT 30.6 (L) 02/15/2021   MCV 91.6 02/15/2021   PLT 228 02/15/2021     Chemistry      Component Value Date/Time   NA 135 02/15/2021 0754   K 3.3 (L) 02/15/2021 0754   CL 104 02/15/2021  0754   CO2 24 02/15/2021 0754   BUN 5 (L) 02/15/2021 0754   CREATININE 0.46 (L) 02/15/2021 0754   CREATININE 0.79 12/14/2020 1151      Component Value Date/Time   CALCIUM 7.5 (L) 02/15/2021 0754   ALKPHOS 90 02/15/2021 0754   AST 13 (L) 02/15/2021 0754   AST 9 (L) 12/14/2020 1151   ALT 9 02/15/2021 0754   ALT 21 12/14/2020 1151   BILITOT 0.4 02/15/2021 0754   BILITOT 0.5 12/14/2020 1151      Impression and Plan: Mr. Crean is a very nice 70 year old white male.  He has kappa light chain myeloma.  He ultimately had the stem cell transplant at [facility] on 01/22/2021.  He clearly is going to need maintenance therapy.  He is way too early to have this right now.  He comes in in a wheelchair.  I am not sure how much she is able to walk around.  Regarding have to watch him closely.  He needs IV fluids today.  I would like to have him come back on Wednesday for labs and more IV fluid.    I am just distressed that he has had symptoms hard time with therapy.  He while we first saw him, he really had a hard time.  He had a very good response to treatment but really his quality of life has been difficult.  He is really having able to do all that much.  He has been hospitalized.  We will try to keep him  out of the hospital if possible.  I would like to see him back in another 3 to 4 weeks.  Again he needs to have weekly labs.   Volanda Napoleon, MD 4/18/20225:09 PM

## 2021-02-15 NOTE — Patient Instructions (Signed)
Dehydration, Adult Dehydration is condition in which there is not enough water or other fluids in the body. This happens when a person loses more fluids than he or she takes in. Important body parts cannot work right without the right amount of fluids. Any loss of fluids from the body can cause dehydration. Dehydration can be mild, worse, or very bad. It should be treated right away to keep it from getting very bad. What are the causes? This condition may be caused by:  Conditions that cause loss of water or other fluids, such as: ? Watery poop (diarrhea). ? Vomiting. ? Sweating a lot. ? Peeing (urinating) a lot.  Not drinking enough fluids, especially when you: ? Are ill. ? Are doing things that take a lot of energy to do.  Other illnesses and conditions, such as fever or infection.  Certain medicines, such as medicines that take extra fluid out of the body (diuretics).  Lack of safe drinking water.  Not being able to get enough water and food. What increases the risk? The following factors may make you more likely to develop this condition:  Having a long-term (chronic) illness that has not been treated the right way, such as: ? Diabetes. ? Heart disease. ? Kidney disease.  Being 65 years of age or older.  Having a disability.  Living in a place that is high above the ground or sea (high in altitude). The thinner, dried air causes more fluid loss.  Doing exercises that put stress on your body for a long time. What are the signs or symptoms? Symptoms of dehydration depend on how bad it is. Mild or worse dehydration  Thirst.  Dry lips or dry mouth.  Feeling dizzy or light-headed, especially when you stand up from sitting.  Muscle cramps.  Your body making: ? Dark pee (urine). Pee may be the color of tea. ? Less pee than normal. ? Less tears than normal.  Headache. Very bad dehydration  Changes in skin. Skin may: ? Be cold to the touch (clammy). ? Be blotchy  or pale. ? Not go back to normal right after you lightly pinch it and let it go.  Little or no tears, pee, or sweat.  Changes in vital signs, such as: ? Fast breathing. ? Low blood pressure. ? Weak pulse. ? Pulse that is more than 100 beats a minute when you are sitting still.  Other changes, such as: ? Feeling very thirsty. ? Eyes that look hollow (sunken). ? Cold hands and feet. ? Being mixed up (confused). ? Being very tired (lethargic) or having trouble waking from sleep. ? Short-term weight loss. ? Loss of consciousness. How is this treated? Treatment for this condition depends on how bad it is. Treatment should start right away. Do not wait until your condition gets very bad. Very bad dehydration is an emergency. You will need to go to a hospital.  Mild or worse dehydration can be treated at home. You may be asked to: ? Drink more fluids. ? Drink an oral rehydration solution (ORS). This drink helps get the right amounts of fluids and salts and minerals in the blood (electrolytes).  Very bad dehydration can be treated: ? With fluids through an IV tube. ? By getting normal levels of salts and minerals in your blood. This is often done by giving salts and minerals through a tube. The tube is passed through your nose and into your stomach. ? By treating the root cause. Follow these instructions at   home: Oral rehydration solution If told by your doctor, drink an ORS:  Make an ORS. Use instructions on the package.  Start by drinking small amounts, about  cup (120 mL) every 5-10 minutes.  Slowly drink more until you have had the amount that your doctor said to have. Eating and drinking  Drink enough clear fluid to keep your pee pale yellow. If you were told to drink an ORS, finish the ORS first. Then, start slowly drinking other clear fluids. Drink fluids such as: ? Water. Do not drink only water. Doing that can make the salt (sodium) level in your body get too low. ? Water  from ice chips you suck on. ? Fruit juice that you have added water to (diluted). ? Low-calorie sports drinks.  Eat foods that have the right amounts of salts and minerals, such as: ? Bananas. ? Oranges. ? Potatoes. ? Tomatoes. ? Spinach.  Do not drink alcohol.  Avoid: ? Drinks that have a lot of sugar. These include:  High-calorie sports drinks.  Fruit juice that you did not add water to.  Soda.  Caffeine. ? Foods that are greasy or have a lot of fat or sugar.         General instructions  Take over-the-counter and prescription medicines only as told by your doctor.  Do not take salt tablets. Doing that can make the salt level in your body get too high.  Return to your normal activities as told by your doctor. Ask your doctor what activities are safe for you.  Keep all follow-up visits as told by your doctor. This is important. Contact a doctor if:  You have pain in your belly (abdomen) and the pain: ? Gets worse. ? Stays in one place.  You have a rash.  You have a stiff neck.  You get angry or annoyed (irritable) more easily than normal.  You are more tired or have a harder time waking than normal.  You feel: ? Weak or dizzy. ? Very thirsty. Get help right away if you have:  Any symptoms of very bad dehydration.  Symptoms of vomiting, such as: ? You cannot eat or drink without vomiting. ? Your vomiting gets worse or does not go away. ? Your vomit has blood or green stuff in it.  Symptoms that get worse with treatment.  A fever.  A very bad headache.  Problems with peeing or pooping (having a bowel movement), such as: ? Watery poop that gets worse or does not go away. ? Blood in your poop (stool). This may cause poop to look black and tarry. ? Not peeing in 6-8 hours. ? Peeing only a small amount of very dark pee in 6-8 hours.  Trouble breathing. These symptoms may be an emergency. Do not wait to see if the symptoms will go away. Get  medical help right away. Call your local emergency services (911 in the [place] Do not drive yourself to the hospital. Summary  Dehydration is a condition in which there is not enough water or other fluids in the body. This happens when a person loses more fluids than he or she takes in.  Treatment for this condition depends on how bad it is. Treatment should be started right away. Do not wait until your condition gets very bad.  Drink enough clear fluid to keep your pee pale yellow. If you were told to drink an oral rehydration solution (ORS), finish the ORS first. Then, start slowly drinking other clear fluids.    Take over-the-counter and prescription medicines only as told by your doctor.  Get help right away if you have any symptoms of very bad dehydration. This information is not intended to replace advice given to you by your health care provider. Make sure you discuss any questions you have with your health care provider. Document Revised: 05/30/2019 Document Reviewed: 05/30/2019 Elsevier Patient Education  2021 [place]. Potassium Citrate Extended-Release Tablets What is this medicine? POTASSIUM CITRATE (poe TASS i um SI treyt) is a potassium salt. It helps to make the urine more alkaline or less acidic. This medicine is used to prevent kidney stones. This medicine may be used for other purposes; ask your health care provider or pharmacist if you have questions. COMMON BRAND NAME(S): Urocit-K What should I tell my health care provider before I take this medicine? They need to know if you have any of these conditions:  dehydration  diabetes  heart damage, failure  kidney disease  stomach ulcers or other problems  swallowing problems  urinary tract infection  an unusual or allergic reaction to potassium citrate, other medicines, foods, dyes, or preservatives  pregnant or trying to get pregnant  breast-feeding How should I use this medicine? Take this medicine with  a full glass of water. Follow the directions on the prescription label. Do not chew, crush or suck on the tablets. Take this medicine in an upright or sitting position. Drink a sip of water before taking the medicine to help you swallow it. Take this medicine with a meal or a snack. Take your doses at regular intervals. Do not take your medicine more often than directed. Talk to your pediatrician regarding the use of this medicine in children. Special care may be needed. Overdosage: If you think you have taken too much of this medicine contact a poison control center or emergency room at once. NOTE: This medicine is only for you. Do not share this medicine with others. What if I miss a dose? If you miss a dose, take it as soon as you can. If it is almost time for your next dose, take only that dose. Do not take double or extra doses. What may interact with this medicine? Do not take this medicine with any of the following medications:  ammonium chloride  antacids  eplerenone  histamine blockers for cold or allergy  medicines for bladder spasm like oxybutynin and tolterodine  medicines for movement abnormalities or Parkinson's disease  potassium supplements  potassium-sparing diuretics  sodium polystyrene sulfonate  some medicines for the stomach like chlordiazepoxide and dicyclomine This medicine may also interact with the following medications:  amphetamine, dextroamphetamine, or similar drugs  aspirin and aspirin-like drugs  digoxin  lithium  methenamine  NSAIDs, medicines for pain and inflammation, like ibuprofen or naproxen  quinidine  quinolone antibiotics  some medicines for high blood pressure, heart problems, kidney protection This list may not describe all possible interactions. Give your health care provider a list of all the medicines, herbs, non-prescription drugs, or dietary supplements you use. Also tell them if you smoke, drink alcohol, or use illegal  drugs. Some items may interact with your medicine. What should I watch for while using this medicine? Visit your doctor or health care professional for regular check ups. Tell your doctor if you have trouble swallowing this medicine, or if it seems to stick in your throat. You will need to have important blood work done while on this medicine. You may need to be on a special diet  while taking this medicine. Ask your doctor. Also, ask how many glasses of fluid you need to drink a day. You must not get dehydrated. You may see the shell of extended-release tablet in the stool. This is normal. The medicine from the tablet has been released. What side effects may I notice from receiving this medicine? Side effects that you should report to your doctor or health care professional as soon as possible:  allergic reactions like skin rash, itching or hives, swelling of the face, lips, or tongue  bloody, black, tarry stools  confused, dizzy, lightheaded, faint  irregular heartbeat, chest pain  numbness or tingling in hands or feet  pain on swallowing  stomach pain  unusual bleeding or bruising  unusually weak or tired Side effects that usually do not require medical attention (report to your doctor or health care professional if they continue or are bothersome):  diarrhea  loose stools  nausea, vomiting  stomach upset This list may not describe all possible side effects. Call your doctor for medical advice about side effects. You may report side effects to FDA at 1-800-FDA-1088. Where should I keep my medicine? Keep out of the reach of children. Store at room temperature between 15 and 30 degrees C (59 and 86 degrees F). Keep container closed tightly. Throw away any unused medicine after the expiration date. NOTE: This sheet is a summary. It may not cover all possible information. If you have questions about this medicine, talk to your doctor, pharmacist, or health care provider.  2021  Elsevier/Gold Standard (2008-05-20 13:18:50)  Dronabinol, THC capsules What is this medicine? DRONABINOL (droe NAB i nol) is used to treat nausea and vomiting caused by cancer treatment, especially for those patients who do not respond to other medicines. This medicine is also used to increase appetite in AIDS patients. This medicine may be used for other purposes; ask your health care provider or pharmacist if you have questions. COMMON BRAND NAME(S): Marinol What should I tell my health care provider before I take this medicine? They need to know if you have any of these conditions:  a history of drug or alcohol abuse  heart disease, including angina or irregular heart rate  high or low blood pressure  dizziness or fainting spells on standing  mental health problems (schizophrenia, mania, depression)  seizures  an unusual or allergic reaction to dronabinol, marijuana, sesame oil, other medicines, foods, dyes, or preservatives  pregnant or trying to get pregnant  breast-feeding How should I use this medicine? Take this medicine by mouth. Follow the directions on the prescription label. Take your doses at regular intervals. Do not take your medicine more often than directed. Talk to your pediatrician regarding the use of this medicine in children. Special care may be needed. Overdosage: If you think you have taken too much of this medicine contact a poison control center or emergency room at once. NOTE: This medicine is only for you. Do not share this medicine with others. What if I miss a dose? If you miss a dose, take it as soon as you can. If it is almost time for your next dose, take only that dose. Do not take double or extra doses. What may interact with this medicine? Do not take this medicine with any of the following medications:  nabilone This medicine may also interact with the following medications:  alcohol-containing medicines or drinks  amphetamine or other  stimulant drugs  antihistamines for allergy, cough and cold  atropine  barbiturates such as phenobarbital  certain medicines for anxiety or sleep  certain medicines for bladder problems like oxybutynin, tolterodine  certain medicines for depression, like amitriptyline, fluoxetine, sertraline  certain medicines for seizures like phenobarbital, primidone  certain medicines for stomach problems like dicyclomine, hyoscyamine  certain medicines for travel sickness like scopolamine  certain medicines for Parkinson's disease like benztropine, trihexyphenidyl  cocaine  disulfiram  general anesthetics like halothane, isoflurane, methoxyflurane, propofol  ipratropium  lithium  local anesthetics like lidocaine, pramoxine, tetracaine  medicines that relax muscles for surgery  narcotic medicines for pain  phenothiazines like chlorpromazine, mesoridazine, prochlorperazine, thioridazine  theophylline This list may not describe all possible interactions. Give your health care provider a list of all the medicines, herbs, non-prescription drugs, or dietary supplements you use. Also tell them if you smoke, drink alcohol, or use illegal drugs. Some items may interact with your medicine. What should I watch for while using this medicine? The first time you take this medicine or have an increase in dose make sure there is a responsible person nearby. You may experience mood changes, easy laughter, or other changes in behavior. You may get drowsy or dizzy. Do not drive, use machinery, or do anything that needs mental alertness until you know how this medicine affects you. Do not stand or sit up quickly, especially if you have low blood pressure or if you are an older patient. This reduces the risk of dizzy or fainting spells. Alcohol may interfere with the effect of this medicine. Avoid alcoholic drinks. If you are taking this medicine to improve your appetite, this is only part of your  therapy. Discuss what you can eat and ways of improving your diet with your health care professional or nutritionist. Frequent small snacks as well as regular meals can help provide extra calories and protein. Do not smoke marijuana while you are taking this medicine. It is similar to one of the active substances found in marijuana. You are at increased risk of serious heart and/or nervous system side effects if these drugs are used together. What side effects may I notice from receiving this medicine? Side effects that you should report to your doctor or health care professional as soon as possible:  allergic reactions like skin rash, itching or hives, swelling of the face, lips, or tongue  fast, irregular heartbeat  feeling faint or lightheaded, falls  hallucinations  panic reactions  seizures  slurred speech Side effects that usually do not require medical attention (report to your doctor or health care professional if they continue or are bothersome):  anxiety or nervousness  confusion  dizziness  nausea, vomiting or diarrhea  stomach upset  tiredness This list may not describe all possible side effects. Call your doctor for medical advice about side effects. You may report side effects to FDA at 1-800-FDA-1088. Where should I keep my medicine? This medicine may cause accidental overdose and death if taken by other adults, children, or pets. Keep out of the reach of children. This medicine can be abused. Keep your medicine in a safe place to protect it from theft. Do not share this medicine with anyone. Selling or giving away this medicine is dangerous and against the law. Store in a cool place between 8 and 15 degrees C (46 and 59 degrees F) or in a refrigerator. Avoid freezing. Mix any unused medicine with a substance like cat litter or coffee grounds. Then throw the medicine away in a sealed container like a sealed bag or  a coffee can with a lid. Do not use the medicine after  the expiration date. NOTE: This sheet is a summary. It may not cover all possible information. If you have questions about this medicine, talk to your doctor, pharmacist, or health care provider.  2021 Elsevier/Gold Standard (2016-07-08 12:09:40)

## 2021-02-16 ENCOUNTER — Telehealth: Payer: Self-pay

## 2021-02-16 NOTE — Telephone Encounter (Signed)
appts made per 02/15/21 los and pt will gain his sch at his 02/16/21 apt   Bruce Smith

## 2021-02-17 ENCOUNTER — Inpatient Hospital Stay: Payer: BC Managed Care – PPO

## 2021-02-17 ENCOUNTER — Other Ambulatory Visit: Payer: Self-pay

## 2021-02-17 VITALS — BP 107/63 | HR 99 | Temp 97.6°F | Resp 17

## 2021-02-17 DIAGNOSIS — C9000 Multiple myeloma not having achieved remission: Secondary | ICD-10-CM

## 2021-02-17 DIAGNOSIS — C419 Malignant neoplasm of bone and articular cartilage, unspecified: Secondary | ICD-10-CM

## 2021-02-17 LAB — CMP (CANCER CENTER ONLY)
ALT: 11 U/L (ref 0–44)
AST: 14 U/L — ABNORMAL LOW (ref 15–41)
Albumin: 2.8 g/dL — ABNORMAL LOW (ref 3.5–5.0)
Alkaline Phosphatase: 97 U/L (ref 38–126)
Anion gap: 8 (ref 5–15)
BUN: 6 mg/dL — ABNORMAL LOW (ref 8–23)
CO2: 24 mmol/L (ref 22–32)
Calcium: 7.7 mg/dL — ABNORMAL LOW (ref 8.9–10.3)
Chloride: 105 mmol/L (ref 98–111)
Creatinine: 0.47 mg/dL — ABNORMAL LOW (ref 0.61–1.24)
GFR, Estimated: >60 mL/min (ref >60)
Glucose, Bld: 120 mg/dL — ABNORMAL HIGH (ref 70–99)
Potassium: 3.4 mmol/L — ABNORMAL LOW (ref 3.5–5.1)
Sodium: 137 mmol/L (ref 135–145)
Total Bilirubin: 0.4 mg/dL (ref 0.3–1.2)
Total Protein: 4.4 g/dL — ABNORMAL LOW (ref 6.5–8.1)

## 2021-02-17 LAB — CBC WITH DIFFERENTIAL (CANCER CENTER ONLY)
Abs Immature Granulocytes: 0.08 10*3/uL — ABNORMAL HIGH (ref 0.00–0.07)
Basophils Absolute: 0 10*3/uL (ref 0.0–0.1)
Basophils Relative: 1 %
Eosinophils Absolute: 0 10*3/uL (ref 0.0–0.5)
Eosinophils Relative: 0 %
HCT: 31.4 % — ABNORMAL LOW (ref 39.0–52.0)
Hemoglobin: 10.6 g/dL — ABNORMAL LOW (ref 13.0–17.0)
Immature Granulocytes: 2 %
Lymphocytes Relative: 9 %
Lymphs Abs: 0.3 10*3/uL — ABNORMAL LOW (ref 0.7–4.0)
MCH: 31.2 pg (ref 26.0–34.0)
MCHC: 33.8 g/dL (ref 30.0–36.0)
MCV: 92.4 fL (ref 80.0–100.0)
Monocytes Absolute: 0.5 10*3/uL (ref 0.1–1.0)
Monocytes Relative: 12 %
Neutro Abs: 3.1 10*3/uL (ref 1.7–7.7)
Neutrophils Relative %: 76 %
Platelet Count: 245 10*3/uL (ref 150–400)
RBC: 3.4 MIL/uL — ABNORMAL LOW (ref 4.22–5.81)
RDW: 15.3 % (ref 11.5–15.5)
WBC Count: 4 10*3/uL (ref 4.0–10.5)
nRBC: 0 % (ref 0.0–0.2)

## 2021-02-17 LAB — SAMPLE TO BLOOD BANK

## 2021-02-17 LAB — MAGNESIUM: Magnesium: 1.6 mg/dL — ABNORMAL LOW (ref 1.7–2.4)

## 2021-02-17 MED ORDER — DICYCLOMINE HCL 10 MG PO CAPS: 10.0000 mg | ORAL_CAPSULE | Freq: Three times a day (TID) | ORAL | 4 refills | Status: DC

## 2021-02-17 MED ORDER — SODIUM CHLORIDE 0.9 % IV SOLN
Freq: Once | INTRAVENOUS | Status: AC
  Filled 2021-02-17: qty 250

## 2021-02-17 MED ORDER — CALCIUM GLUCONATE 10 % IV SOLN
2.0000 g | Freq: Once | INTRAVENOUS | Status: AC
  Administered 2021-02-17: 2 g via INTRAVENOUS
  Filled 2021-02-17: qty 20

## 2021-02-17 MED ORDER — APREPITANT 80 MG PO CAPS: 80.0000 mg | ORAL_CAPSULE | Freq: Every day | ORAL | 0 refills | Status: DC

## 2021-02-17 NOTE — Patient Instructions (Signed)
Rehydration, Adult Rehydration is the replacement of body fluids, salts, and minerals (electrolytes) that are lost during dehydration. Dehydration is when there is not enough water or other fluids in the body. This happens when you lose more fluids than you take in. Common causes of dehydration include:  Not drinking enough fluids. This can occur when you are ill or doing activities that require a lot of energy, especially in hot weather.  Conditions that cause loss of water or other fluids, such as diarrhea, vomiting, sweating, or urinating a lot.  Other illnesses, such as fever or infection.  Certain medicines, such as those that remove excess fluid from the body (diuretics). Symptoms of mild or moderate dehydration may include thirst, dry lips and mouth, and dizziness. Symptoms of severe dehydration may include increased heart rate, confusion, fainting, and not urinating. For severe dehydration, you may need to get fluids through an IV at the hospital. For mild or moderate dehydration, you can usually rehydrate at home by drinking certain fluids as told by your health care provider. What are the risks? Generally, rehydration is safe. However, taking in too much fluid (overhydration) can be a problem. This is rare. Overhydration can cause an electrolyte imbalance, kidney failure, or a decrease in salt (sodium) levels in the body. Supplies needed You will need an oral rehydration solution (ORS) if your health care provider tells you to use one. This is a drink to treat dehydration. It can be found in pharmacies and retail stores. How to rehydrate Fluids Follow instructions from your health care provider for rehydration. The kind of fluid and the amount you should drink depend on your condition. In general, you should choose drinks that you prefer.  If told by your health care provider, drink an ORS. ? Make an ORS by following instructions on the package. ? Start by drinking small amounts,  about  cup (120 mL) every 5-10 minutes. ? Slowly increase how much you drink until you have taken the amount recommended by your health care provider.  Drink enough clear fluids to keep your urine pale yellow. If you were told to drink an ORS, finish it first, then start slowly drinking other clear fluids. Drink fluids such as: ? Water. This includes sparkling water and flavored water. Drinking only water can lead to having too little sodium in your body (hyponatremia). Follow the advice of your health care provider. ? Water from ice chips you suck on. ? Fruit juice with water you add to it (diluted). ? Sports drinks. ? Hot or cold herbal teas. ? Broth-based soups. ? Milk or milk products. Food Follow instructions from your health care provider about what to eat while you rehydrate. Your health care provider may recommend that you slowly begin eating regular foods in small amounts.  Eat foods that contain a healthy balance of electrolytes, such as bananas, oranges, potatoes, tomatoes, and spinach.  Avoid foods that are greasy or contain a lot of sugar. In some cases, you may get nutrition through a feeding tube that is passed through your nose and into your stomach (nasogastric tube, or NG tube). This may be done if you have uncontrolled vomiting or diarrhea.   Beverages to avoid Certain beverages may make dehydration worse. While you rehydrate, avoid drinking alcohol.   How to tell if you are recovering from dehydration You may be recovering from dehydration if:  You are urinating more often than before you started rehydrating.  Your urine is pale yellow.  Your energy level   improves.  You vomit less frequently.  You have diarrhea less frequently.  Your appetite improves or returns to normal.  You feel less dizzy or less light-headed.  Your skin tone and color start to look more normal. Follow these instructions at home:  Take over-the-counter and prescription medicines only  as told by your health care provider.  Do not take sodium tablets. Doing this can lead to having too much sodium in your body (hypernatremia). Contact a health care provider if:  You continue to have symptoms of mild or moderate dehydration, such as: ? Thirst. ? Dry lips. ? Slightly dry mouth. ? Dizziness. ? Dark urine or less urine than normal. ? Muscle cramps.  You continue to vomit or have diarrhea. Get help right away if you:  Have symptoms of dehydration that get worse.  Have a fever.  Have a severe headache.  Have been vomiting and the following happens: ? Your vomiting gets worse or does not go away. ? Your vomit includes blood or green matter (bile). ? You cannot eat or drink without vomiting.  Have problems with urination or bowel movements, such as: ? Diarrhea that gets worse or does not go away. ? Blood in your stool (feces). This may cause stool to look black and tarry. ? Not urinating, or urinating only a small amount of very dark urine, within 6-8 hours.  Have trouble breathing.  Have symptoms that get worse with treatment. These symptoms may represent a serious problem that is an emergency. Do not wait to see if the symptoms will go away. Get medical help right away. Call your local emergency services (911 in the [place] Do not drive yourself to the hospital. Summary  Rehydration is the replacement of body fluids and minerals (electrolytes) that are lost during dehydration.  Follow instructions from your health care provider for rehydration. The kind of fluid and amount you should drink depend on your condition.  Slowly increase how much you drink until you have taken the amount recommended by your health care provider.  Contact your health care provider if you continue to show signs of mild or moderate dehydration. This information is not intended to replace advice given to you by your health care provider. Make sure you discuss any questions you have with  your health care provider. Document Revised: 12/18/2019 Document Reviewed: 10/28/2019 Elsevier Patient Education  2021 [employer]  

## 2021-02-22 ENCOUNTER — Encounter: Payer: Self-pay | Admitting: *Deleted

## 2021-02-22 ENCOUNTER — Telehealth: Payer: Self-pay | Admitting: Nutrition

## 2021-02-22 ENCOUNTER — Encounter: Payer: BC Managed Care – PPO | Admitting: Nutrition

## 2021-02-22 NOTE — Progress Notes (Signed)
Received notice from Dory Peru that patient is having multiple issues at home. Primarily he has lost significant weight. He is unable to eat and vomits most of what he tries to take in. They have tried multiple medication regimens and types of food. He likely needs PT/OT. There is mention in the chart of a pressure ulcer.  Patient already had an appointment for lab work tomorrow morning. We will also add an appointment for Sarah to see the patient. We will get a weight. I called an spoke to patient's wife. She is unable to come tomorrow but patient's daughter will be with him.   Oncology Nurse Navigator Documentation  Oncology Nurse Navigator Flowsheets 02/22/2021  Abnormal Finding Date -  Confirmed Diagnosis Date -  Diagnosis Status -  Planned Course of Treatment -  Phase of Treatment -  Chemotherapy Actual Start Date: -  Radiation Actual Start Date: -  Radiation Expected End Date: -  Radiation Actual End Date: -  Navigator Follow Up Date: 02/23/2021  Navigator Follow Up Reason: Follow-up Appointment  Navigator Location [facility]  Navigator Encounter Type Telephone  Telephone Patient Update;Symptom Mgt;Incoming Call;Outgoing Call  Patient Visit Type MedOnc  Treatment Phase Active Tx  Barriers/Navigation Needs Coordination of Care;Education;Family Concerns  Education -  Interventions Coordination of Care  Acuity Level 2-Minimal Needs (1-2 Barriers Identified)  Referrals -  Coordination of Care Appts  Education Method Verbal  Support Groups/Services Friends and Family  Time Spent with Patient 30

## 2021-02-22 NOTE — Telephone Encounter (Signed)
Telephone follow-up completed with patient's wife.  He was identified for weight loss on the MST report.  Patient is status post autologous stem cell transplant at [facility] for multiple myeloma on January 22, 2021.    Last weight documented was 158 pounds December 09, 2020.  Patient reported usual body weight of 232 pounds August 2021 and then weighed 209 pounds on July 22, 2020.  This is a 32% weight loss from usual body weight; and a 24% weight loss over 7 months which is significant. Labs reviewed and noted: Potassium 3.4, glucose 120, BUN 6, creatinine 0.47 and magnesium 1.6. Wife reports patient continues to have ongoing nausea with dry heaves every day, all day despite Zofran.  She has tried Marinol and patient has been able to eat a few extra bites of food but typical food intake for day is 2 saltine crackers and a 1 inch piece of banana.  He has tried multiple different things like chicken noodle soup but vomits immediately.  He vomited up 1 bite of an egg omelette on Saturday.  He was able to eat one half sandwich after taking Marinol on 2 different occasions. Wife reports patient was supposed to receive home OT and PT but this not been easily arranged. She does not feel patient could leave the house for out patient therapy. Patient is very anxious and frustrated.  She reports patient is a"basket case" so she has been giving him Ativan.  She voices increased frustration.  She does not know why [facility] discharged him when he was not able to eat.  Nutrition diagnosis: Unintended weight loss continues.  Intervention: Provided active listening to patient's wife and allowed her to voice her frustrations.  I contacted nurse navigator at [facility] who will make arrangements for patient to see a provider tomorrow when he comes in for lab work.  She will also try to obtain a current weight on patient.  She will asked Dr. [name] about adding IV fluid and nausea medication tomorrow after labs.   I have asked wife to continue oral nausea medication as prescribed by MD.  Question if patient would be appropriate for enteral nutrition if his gut works or parenteral nutrition if he needs bowel rest. (? Prognosis/ plan of care)   I will call patient's wife tomorrow to check in with her.   Monitoring, evaluation, goals: Patient will tolerate oral intake or nutrition support to improve calorie and protein intake.  Next visit: Tuesday, April 26 by telephone.  **Disclaimer: This note was dictated with voice recognition software. Similar sounding words can inadvertently be transcribed and this note may contain transcription errors which may not have been corrected upon publication of note.**

## 2021-02-23 ENCOUNTER — Other Ambulatory Visit: Payer: Self-pay

## 2021-02-23 ENCOUNTER — Inpatient Hospital Stay (HOSPITAL_BASED_OUTPATIENT_CLINIC_OR_DEPARTMENT_OTHER): Payer: BC Managed Care – PPO | Admitting: Family

## 2021-02-23 ENCOUNTER — Encounter (HOSPITAL_COMMUNITY): Payer: Self-pay | Admitting: Hematology & Oncology

## 2021-02-23 ENCOUNTER — Inpatient Hospital Stay (HOSPITAL_COMMUNITY)
Admission: AD | Admit: 2021-02-23 | Discharge: 2021-03-10 | DRG: 840 | Disposition: A | Discharge status: Home / Self Care | Payer: BC Managed Care – PPO | Attending: Hematology & Oncology | Admitting: Hematology & Oncology

## 2021-02-23 ENCOUNTER — Inpatient Hospital Stay: Payer: BC Managed Care – PPO

## 2021-02-23 ENCOUNTER — Encounter: Payer: Self-pay | Admitting: *Deleted

## 2021-02-23 ENCOUNTER — Telehealth: Payer: Self-pay | Admitting: Nutrition

## 2021-02-23 ENCOUNTER — Inpatient Hospital Stay: Payer: Self-pay

## 2021-02-23 ENCOUNTER — Other Ambulatory Visit: Payer: Self-pay | Admitting: Hematology & Oncology

## 2021-02-23 VITALS — BP 99/55 | HR 103 | Temp 97.5°F | Resp 17

## 2021-02-23 DIAGNOSIS — D696 Thrombocytopenia, unspecified: Secondary | ICD-10-CM | POA: Diagnosis present

## 2021-02-23 DIAGNOSIS — R110 Nausea: Secondary | ICD-10-CM | POA: Diagnosis not present

## 2021-02-23 DIAGNOSIS — E876 Hypokalemia: Secondary | ICD-10-CM | POA: Diagnosis not present

## 2021-02-23 DIAGNOSIS — Z87891 Personal history of nicotine dependence: Secondary | ICD-10-CM | POA: Diagnosis not present

## 2021-02-23 DIAGNOSIS — Z79899 Other long term (current) drug therapy: Secondary | ICD-10-CM

## 2021-02-23 DIAGNOSIS — E43 Unspecified severe protein-calorie malnutrition: Secondary | ICD-10-CM | POA: Diagnosis present

## 2021-02-23 DIAGNOSIS — R531 Weakness: Secondary | ICD-10-CM

## 2021-02-23 DIAGNOSIS — Z981 Arthrodesis status: Secondary | ICD-10-CM | POA: Diagnosis not present

## 2021-02-23 DIAGNOSIS — M79671 Pain in right foot: Secondary | ICD-10-CM

## 2021-02-23 DIAGNOSIS — M84552A Pathological fracture in neoplastic disease, left femur, initial encounter for fracture: Secondary | ICD-10-CM | POA: Diagnosis present

## 2021-02-23 DIAGNOSIS — E440 Moderate protein-calorie malnutrition: Secondary | ICD-10-CM | POA: Insufficient documentation

## 2021-02-23 DIAGNOSIS — D638 Anemia in other chronic diseases classified elsewhere: Secondary | ICD-10-CM | POA: Diagnosis present

## 2021-02-23 DIAGNOSIS — R410 Disorientation, unspecified: Secondary | ICD-10-CM | POA: Diagnosis present

## 2021-02-23 DIAGNOSIS — C419 Malignant neoplasm of bone and articular cartilage, unspecified: Secondary | ICD-10-CM | POA: Diagnosis not present

## 2021-02-23 DIAGNOSIS — Z9484 Stem cells transplant status: Secondary | ICD-10-CM

## 2021-02-23 DIAGNOSIS — M84512A Pathological fracture in neoplastic disease, left shoulder, initial encounter for fracture: Secondary | ICD-10-CM | POA: Diagnosis present

## 2021-02-23 DIAGNOSIS — Z515 Encounter for palliative care: Secondary | ICD-10-CM | POA: Diagnosis not present

## 2021-02-23 DIAGNOSIS — G893 Neoplasm related pain (acute) (chronic): Secondary | ICD-10-CM | POA: Diagnosis present

## 2021-02-23 DIAGNOSIS — N179 Acute kidney failure, unspecified: Secondary | ICD-10-CM | POA: Diagnosis not present

## 2021-02-23 DIAGNOSIS — Y93E1 Activity, personal bathing and showering: Secondary | ICD-10-CM

## 2021-02-23 DIAGNOSIS — E860 Dehydration: Secondary | ICD-10-CM | POA: Diagnosis present

## 2021-02-23 DIAGNOSIS — R52 Pain, unspecified: Secondary | ICD-10-CM | POA: Diagnosis not present

## 2021-02-23 DIAGNOSIS — R109 Unspecified abdominal pain: Secondary | ICD-10-CM | POA: Diagnosis not present

## 2021-02-23 DIAGNOSIS — B952 Enterococcus as the cause of diseases classified elsewhere: Secondary | ICD-10-CM | POA: Diagnosis present

## 2021-02-23 DIAGNOSIS — R112 Nausea with vomiting, unspecified: Secondary | ICD-10-CM | POA: Diagnosis not present

## 2021-02-23 DIAGNOSIS — N390 Urinary tract infection, site not specified: Secondary | ICD-10-CM | POA: Diagnosis present

## 2021-02-23 DIAGNOSIS — I952 Hypotension due to drugs: Secondary | ICD-10-CM | POA: Diagnosis not present

## 2021-02-23 DIAGNOSIS — R5381 Other malaise: Secondary | ICD-10-CM

## 2021-02-23 DIAGNOSIS — R770 Abnormality of albumin: Secondary | ICD-10-CM

## 2021-02-23 DIAGNOSIS — W182XXA Fall in (into) shower or empty bathtub, initial encounter: Secondary | ICD-10-CM | POA: Diagnosis present

## 2021-02-23 DIAGNOSIS — Z20822 Contact with and (suspected) exposure to covid-19: Secondary | ICD-10-CM | POA: Diagnosis present

## 2021-02-23 DIAGNOSIS — M79672 Pain in left foot: Secondary | ICD-10-CM | POA: Diagnosis not present

## 2021-02-23 DIAGNOSIS — R197 Diarrhea, unspecified: Secondary | ICD-10-CM | POA: Diagnosis not present

## 2021-02-23 DIAGNOSIS — C9000 Multiple myeloma not having achieved remission: Principal | ICD-10-CM | POA: Diagnosis present

## 2021-02-23 DIAGNOSIS — Z6822 Body mass index (BMI) 22.0-22.9, adult: Secondary | ICD-10-CM | POA: Diagnosis not present

## 2021-02-23 DIAGNOSIS — I2510 Atherosclerotic heart disease of native coronary artery without angina pectoris: Secondary | ICD-10-CM | POA: Diagnosis present

## 2021-02-23 DIAGNOSIS — R1310 Dysphagia, unspecified: Secondary | ICD-10-CM | POA: Diagnosis present

## 2021-02-23 DIAGNOSIS — E46 Unspecified protein-calorie malnutrition: Secondary | ICD-10-CM | POA: Diagnosis not present

## 2021-02-23 DIAGNOSIS — R630 Anorexia: Secondary | ICD-10-CM | POA: Diagnosis present

## 2021-02-23 DIAGNOSIS — E785 Hyperlipidemia, unspecified: Secondary | ICD-10-CM | POA: Diagnosis present

## 2021-02-23 DIAGNOSIS — R6881 Early satiety: Secondary | ICD-10-CM | POA: Diagnosis present

## 2021-02-23 DIAGNOSIS — T446X5A Adverse effect of alpha-adrenoreceptor antagonists, initial encounter: Secondary | ICD-10-CM | POA: Diagnosis not present

## 2021-02-23 DIAGNOSIS — Z792 Long term (current) use of antibiotics: Secondary | ICD-10-CM | POA: Diagnosis not present

## 2021-02-23 DIAGNOSIS — I10 Essential (primary) hypertension: Secondary | ICD-10-CM | POA: Diagnosis present

## 2021-02-23 DIAGNOSIS — Z7189 Other specified counseling: Secondary | ICD-10-CM | POA: Diagnosis not present

## 2021-02-23 DIAGNOSIS — K5900 Constipation, unspecified: Secondary | ICD-10-CM | POA: Diagnosis present

## 2021-02-23 DIAGNOSIS — M4712 Other spondylosis with myelopathy, cervical region: Secondary | ICD-10-CM

## 2021-02-23 DIAGNOSIS — M79606 Pain in leg, unspecified: Secondary | ICD-10-CM | POA: Diagnosis not present

## 2021-02-23 DIAGNOSIS — R627 Adult failure to thrive: Secondary | ICD-10-CM | POA: Diagnosis present

## 2021-02-23 DIAGNOSIS — I252 Old myocardial infarction: Secondary | ICD-10-CM

## 2021-02-23 DIAGNOSIS — M7989 Other specified soft tissue disorders: Secondary | ICD-10-CM | POA: Diagnosis not present

## 2021-02-23 LAB — URINALYSIS, ROUTINE W REFLEX MICROSCOPIC
Bilirubin Urine: NEGATIVE
Glucose, UA: NEGATIVE mg/dL
Hgb urine dipstick: NEGATIVE
Ketones, ur: NEGATIVE mg/dL
Nitrite: NEGATIVE
Protein, ur: NEGATIVE mg/dL
Specific Gravity, Urine: 1.01 (ref 1.005–1.030)
pH: 6 (ref 5.0–8.0)

## 2021-02-23 LAB — CBC WITH DIFFERENTIAL (CANCER CENTER ONLY)
Abs Immature Granulocytes: 0.11 10*3/uL — ABNORMAL HIGH (ref 0.00–0.07)
Basophils Absolute: 0 10*3/uL (ref 0.0–0.1)
Basophils Relative: 1 %
Eosinophils Absolute: 0 10*3/uL (ref 0.0–0.5)
Eosinophils Relative: 0 %
HCT: 35.2 % — ABNORMAL LOW (ref 39.0–52.0)
Hemoglobin: 11.4 g/dL — ABNORMAL LOW (ref 13.0–17.0)
Immature Granulocytes: 2 %
Lymphocytes Relative: 24 %
Lymphs Abs: 1.4 10*3/uL (ref 0.7–4.0)
MCH: 31 pg (ref 26.0–34.0)
MCHC: 32.4 g/dL (ref 30.0–36.0)
MCV: 95.7 fL (ref 80.0–100.0)
Monocytes Absolute: 1 10*3/uL (ref 0.1–1.0)
Monocytes Relative: 17 %
Neutro Abs: 3.3 10*3/uL (ref 1.7–7.7)
Neutrophils Relative %: 56 %
Platelet Count: 215 10*3/uL (ref 150–400)
RBC: 3.68 MIL/uL — ABNORMAL LOW (ref 4.22–5.81)
RDW: 15.4 % (ref 11.5–15.5)
WBC Count: 5.9 10*3/uL (ref 4.0–10.5)
nRBC: 0 % (ref 0.0–0.2)

## 2021-02-23 LAB — CMP (CANCER CENTER ONLY)
ALT: 13 U/L (ref 0–44)
AST: 17 U/L (ref 15–41)
Albumin: 3.2 g/dL — ABNORMAL LOW (ref 3.5–5.0)
Alkaline Phosphatase: 104 U/L (ref 38–126)
Anion gap: 10 (ref 5–15)
BUN: 5 mg/dL — ABNORMAL LOW (ref 8–23)
CO2: 26 mmol/L (ref 22–32)
Calcium: 7.9 mg/dL — ABNORMAL LOW (ref 8.9–10.3)
Chloride: 104 mmol/L (ref 98–111)
Creatinine: 0.55 mg/dL — ABNORMAL LOW (ref 0.61–1.24)
GFR, Estimated: >60 mL/min (ref >60)
Glucose, Bld: 119 mg/dL — ABNORMAL HIGH (ref 70–99)
Potassium: 2.9 mmol/L — ABNORMAL LOW (ref 3.5–5.1)
Sodium: 140 mmol/L (ref 135–145)
Total Bilirubin: 0.4 mg/dL (ref 0.3–1.2)
Total Protein: 4.9 g/dL — ABNORMAL LOW (ref 6.5–8.1)

## 2021-02-23 LAB — MAGNESIUM: Magnesium: 1.6 mg/dL — ABNORMAL LOW (ref 1.7–2.4)

## 2021-02-23 LAB — LACTATE DEHYDROGENASE: LDH: 248 U/L — ABNORMAL HIGH (ref 98–192)

## 2021-02-23 LAB — SAMPLE TO BLOOD BANK

## 2021-02-23 LAB — FERRITIN: Ferritin: 938 ng/mL — ABNORMAL HIGH (ref 24–336)

## 2021-02-23 LAB — IRON AND TIBC
Iron: 67 ug/dL (ref 42–163)
Saturation Ratios: 43 % (ref 20–55)
TIBC: 156 ug/dL — ABNORMAL LOW (ref 202–409)
UIBC: 90 ug/dL — ABNORMAL LOW (ref 117–376)

## 2021-02-23 LAB — MRSA PCR SCREENING: MRSA by PCR: NEGATIVE

## 2021-02-23 LAB — TSH: TSH: 3.44 u[IU]/mL (ref 0.320–4.118)

## 2021-02-23 MED ORDER — ENOXAPARIN SODIUM 40 MG/0.4ML ~~LOC~~ SOLN
40.0000 mg | SUBCUTANEOUS | Status: DC
  Administered 2021-02-23 – 2021-03-07 (×13): 40 mg via SUBCUTANEOUS
  Filled 2021-02-23 (×13): qty 0.4

## 2021-02-23 MED ORDER — PREGABALIN 50 MG PO CAPS
100.0000 mg | ORAL_CAPSULE | Freq: Every day | ORAL | Status: DC
  Administered 2021-02-24 – 2021-02-26 (×3): 100 mg via ORAL
  Filled 2021-02-23 (×3): qty 2

## 2021-02-23 MED ORDER — MAGNESIUM SULFATE 2 GM/50ML IV SOLN
2.0000 g | Freq: Once | INTRAVENOUS | Status: AC
Start: 2021-02-23 — End: 2021-02-23
  Administered 2021-02-23: 2 g via INTRAVENOUS
  Filled 2021-02-23 (×2): qty 50

## 2021-02-23 MED ORDER — FENTANYL 12 MCG/HR TD PT72
1.0000 | MEDICATED_PATCH | TRANSDERMAL | Status: DC
  Administered 2021-02-26 – 2021-03-01 (×2): 1 via TRANSDERMAL
  Filled 2021-02-23 (×2): qty 1

## 2021-02-23 MED ORDER — TAMSULOSIN HCL 0.4 MG PO CAPS
0.4000 mg | ORAL_CAPSULE | Freq: Every day | ORAL | Status: DC
  Administered 2021-02-24 – 2021-03-09 (×14): 0.4 mg via ORAL
  Filled 2021-02-23 (×14): qty 1

## 2021-02-23 MED ORDER — LORAZEPAM 2 MG/ML IJ SOLN: 1.0000 mg | Freq: Four times a day (QID) | INTRAMUSCULAR | Status: DC | PRN

## 2021-02-23 MED ORDER — CHLORHEXIDINE GLUCONATE CLOTH 2 % EX PADS
6.0000 | MEDICATED_PAD | Freq: Every day | CUTANEOUS | Status: DC
  Administered 2021-02-23 – 2021-03-09 (×15): 6 via TOPICAL

## 2021-02-23 MED ORDER — SODIUM CHLORIDE 0.9 % IV SOLN: 80.0000 mg | INTRAVENOUS | Status: DC

## 2021-02-23 MED ORDER — HYDROMORPHONE HCL 2 MG/ML IJ SOLN
2.0000 mg | INTRAMUSCULAR | Status: DC | PRN
  Administered 2021-02-24 – 2021-03-02 (×23): 2 mg via INTRAVENOUS
  Filled 2021-02-23 (×23): qty 1

## 2021-02-23 MED ORDER — FENTANYL 37.5 MCG/HR TD PT72: 37.5000 ug | MEDICATED_PATCH | TRANSDERMAL | Status: DC

## 2021-02-23 MED ORDER — PANTOPRAZOLE SODIUM 40 MG IV SOLR
40.0000 mg | Freq: Two times a day (BID) | INTRAVENOUS | Status: DC
  Administered 2021-02-23 – 2021-02-24 (×3): 40 mg via INTRAVENOUS
  Filled 2021-02-23 (×3): qty 40

## 2021-02-23 MED ORDER — THIAMINE HCL 100 MG/ML IJ SOLN
Freq: Once | INTRAVENOUS | Status: AC
  Filled 2021-02-23: qty 1000

## 2021-02-23 MED ORDER — ACYCLOVIR 400 MG PO TABS
400.0000 mg | ORAL_TABLET | Freq: Two times a day (BID) | ORAL | Status: DC
  Administered 2021-02-23 – 2021-03-03 (×17): 400 mg via ORAL
  Filled 2021-02-23 (×17): qty 1

## 2021-02-23 MED ORDER — PANTOPRAZOLE SODIUM 40 MG IV SOLR
40.0000 mg | Freq: Once | INTRAVENOUS | Status: DC
  Filled 2021-02-23: qty 40

## 2021-02-23 MED ORDER — LORAZEPAM 0.5 MG PO TABS
0.5000 mg | ORAL_TABLET | Freq: Four times a day (QID) | ORAL | Status: DC | PRN
  Administered 2021-02-24 – 2021-03-04 (×2): 0.5 mg via ORAL
  Filled 2021-02-23 (×2): qty 1

## 2021-02-23 MED ORDER — FENTANYL 25 MCG/HR TD PT72
1.0000 | MEDICATED_PATCH | TRANSDERMAL | Status: DC
  Administered 2021-02-26 – 2021-03-01 (×2): 1 via TRANSDERMAL
  Filled 2021-02-23 (×2): qty 1

## 2021-02-23 MED ORDER — SODIUM CHLORIDE 0.9 % IV SOLN
8.0000 mg | Freq: Three times a day (TID) | INTRAVENOUS | Status: DC
  Administered 2021-02-24 – 2021-02-28 (×13): 8 mg via INTRAVENOUS
  Filled 2021-02-23 (×16): qty 4

## 2021-02-23 MED ORDER — SODIUM CHLORIDE 0.9% FLUSH
10.0000 mL | Freq: Two times a day (BID) | INTRAVENOUS | Status: DC
  Administered 2021-02-23 – 2021-03-01 (×9): 10 mL
  Administered 2021-03-01 – 2021-03-02 (×2): 20 mL
  Administered 2021-03-02 – 2021-03-08 (×9): 10 mL
  Administered 2021-03-08: 20 mL
  Administered 2021-03-09 (×2): 10 mL

## 2021-02-23 MED ORDER — DRONABINOL 5 MG PO CAPS
5.0000 mg | ORAL_CAPSULE | Freq: Every day | ORAL | Status: DC | PRN
  Filled 2021-02-23: qty 1

## 2021-02-23 MED ORDER — SODIUM CHLORIDE 0.9% FLUSH: 10.0000 mL | INTRAVENOUS | Status: DC | PRN

## 2021-02-23 NOTE — Progress Notes (Signed)
[facility] [employer] [facility]) Hospital Liaison note:  This is a pending outpatient-based Palliative Care patient. Will continue to follow for disposition.  Please call with any outpatient palliative questions or concerns.  Thank you, Lorelee Market, LPN [facility] Liaison [phone]

## 2021-02-23 NOTE — Telephone Encounter (Signed)
I contacted patient's wife by telephone to follow-up on MD visit today.  She reports patient is being admitted.  She is very happy that he will be admitted for care.  Once patient's bed is determined I well communicate with inpatient dietitian to inform her about this patient's admission.

## 2021-02-23 NOTE — Progress Notes (Signed)
Hematology and Oncology Follow Up Visit  Bruce Smith [id number] 04-Nov-1950 70 y.o. 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 He is quite weak and unable to stand for a weight.  He has had persistent nausea and vomiting with eating. He is able to keep water down but will vomit with Boost or ensure.  He has history of esophageal stricture per daughter and needs to have this stretched.  Oral exam negative for evidence of thrush or stomatitis.  He has abdominal discomfort described as cramping.  No fever, chills, cough, rash, chest pain, palpitations or changes in bowel or bladder habits. He has been taking his Miralax daily and states that his bowels are moving once a day. He is urinating without difficulty.  He has had some dizziness. He had what the describe as a controlled fall in the shower with his wife helping him to the floor due to weakness.  No syncope to report.  No swelling noted in his extremities at this time.  He has not noted any obvious blood loss. No petechiae. Skin on the arms is thin and he bruises easily.   ECOG Performance Status: 3 - Symptomatic, >50% confined to bed  Medications:  Allergies as of 02/23/2021   No Known Allergies     Medication List       Accurate as of February 23, 2021 10:40 AM. If you have any questions, ask your nurse or doctor.        acetaminophen 500 MG tablet Commonly known as: TYLENOL Take 1,000 mg by mouth daily  as needed.   acyclovir 400 MG tablet Commonly known as: ZOVIRAX Take 400 mg by mouth 2 (two) times daily.   aprepitant 80 MG capsule Commonly known as: EMEND Take 1 capsule (80 mg total) by mouth daily. Start the day after chemotherapy.   aspirin 81 MG EC tablet Take 81 mg by mouth daily. Swallow whole.   atorvastatin 40 MG tablet Commonly known as: LIPITOR Take 20 mg by mouth daily.   dexamethasone 4 MG tablet Commonly known as: DECADRON Take 4 mg by mouth daily. Take one tablet for 3 days after chemotherapy.   dicyclomine 10 MG capsule Commonly known as: Bentyl Take 1 capsule (10 mg total) by mouth 4 (four) times daily -  before meals and at bedtime.   dronabinol 5 MG capsule Commonly known as: MARINOL Take 5 mg by mouth daily as needed (nausea).   Effient 10 MG Tabs tablet Generic drug: prasugrel Take 10 mg by mouth daily.   ergocalciferol 1.25 MG (50000 UT) capsule Commonly known as: VITAMIN D2 Take by mouth once a week.   famciclovir 500 MG tablet Commonly known as: FAMVIR Take 1 tablet (500 mg total) by mouth daily.   fentaNYL 37.5 MCG/HR Pt72 Place onto the skin.   Flovent HFA 220 MCG/ACT inhaler Generic drug: fluticasone Inhale into the lungs.   HYDROmorphone 4 MG tablet Commonly  known as: DILAUDID Take one (1) tablet every 8 hours and 1 tablet 3 times daily as needed for pain   lisinopril 5 MG tablet Commonly known as: ZESTRIL Take by mouth.   LORazepam 0.5 MG tablet Commonly known as: ATIVAN Take 1 tablet (0.5 mg total) by mouth every 6 (six) hours as needed for anxiety (nausea).   megestrol 625 MG/5ML suspension Commonly known as: MEGACE ES Take 5 mLs (625 mg total) by mouth daily.   metoCLOPramide 10 MG tablet Commonly known as: REGLAN Take 1 tablet (10 mg total) by mouth 4 (four) times daily -  before meals and at bedtime. What changed: when to take this   metoprolol succinate 50 MG 24 hr tablet Commonly known as: TOPROL-XL Take 50  mg by mouth daily.   naloxone 4 MG/0.1ML Liqd nasal spray kit Commonly known as: NARCAN Place 1 spray into the nose once.   nitroGLYCERIN 0.4 MG SL tablet Commonly known as: NITROSTAT Place 0.4 mg under the tongue every 5 (five) minutes as needed for chest pain.   Nivestym 480 MCG/0.8ML Sosy injection Generic drug: filgrastim-aafi Inject into the skin.   OLANZapine 10 MG tablet Commonly known as: ZYPREXA Take 1 tablet (10 mg total) by mouth at bedtime.   omeprazole 40 MG capsule Commonly known as: PRILOSEC Take 40 mg by mouth daily.   ondansetron 8 MG disintegrating tablet Commonly known as: ZOFRAN-ODT Take 8 mg by mouth 3 (three) times daily.   ondansetron 8 MG tablet Commonly known as: ZOFRAN TAKE 1 TABLET BY MOUTH EVERY 8 HOURS AS NEEDED NAUSEA AND VOMITING   Oxycodone HCl 10 MG Tabs Take 1 tablet by oral route every 6 hours as needed for pain.   polyethylene glycol 17 g packet Commonly known as: MiraLax Take 17 g by mouth daily.   pregabalin 100 MG capsule Commonly known as: LYRICA Take 1 capsule (100 mg total) by mouth daily.   promethazine 12.5 MG tablet Commonly known as: PHENERGAN Take by mouth.   sennosides-docusate sodium 8.6-50 MG tablet Commonly known as: SENOKOT-S Take 2 tablets by mouth daily.   silver sulfADIAZINE 1 % cream Commonly known as: Silvadene Apply 1 application topically 2 (two) times daily. What changed:   when to take this  reasons to take this   tamsulosin 0.4 MG Caps capsule Commonly known as: FLOMAX Take 0.4 mg by mouth at bedtime.       Allergies: No Known Allergies  Past Medical History, Surgical history, Social history, and Family History were reviewed and updated.  Review of Systems: All other 10 point review of systems is negative.   Physical Exam:  oral temperature is 97.5 F (36.4 C) (abnormal). His blood pressure is 99/55 (abnormal) and his pulse is 103 (abnormal). His respiration is 17 and oxygen  saturation is 97%.   Wt Readings from Last 3 Encounters:  12/09/20 158 lb (71.7 kg)  12/03/20 162 lb (73.5 kg)  11/03/20 169 lb (76.7 kg)    Ocular: Sclerae unicteric, pupils equal, round and reactive to light Ear-nose-throat: Oropharynx clear, dentition fair Lymphatic: No cervical, supraclavicular or axillary adenopathy Lungs no rales or rhonchi, good excursion bilaterally Heart regular rate and rhythm, no murmur appreciated Abd soft, nontender, positive bowel sounds, no liver or spleen tip palpated on exam, no fluid wave  MSK no focal spinal tenderness, no joint edema Neuro: non-focal, well-oriented, appropriate affect Breasts: Deferred   Lab Results  Component Value Date   WBC 5.9 02/23/2021   HGB 11.4 (  L) 02/23/2021   HCT 35.2 (L) 02/23/2021   MCV 95.7 02/23/2021   PLT 215 02/23/2021   No results found for: FERRITIN, IRON, TIBC, UIBC, IRONPCTSAT Lab Results  Component Value Date   RBC 3.68 (L) 02/23/2021   Lab Results  Component Value Date   KPAFRELGTCHN 8.2 11/03/2020   LAMBDASER 2.9 (L) 11/03/2020   KAPLAMBRATIO 2.83 (H) 11/03/2020   Lab Results  Component Value Date   IGGSERUM 317 (L) 11/03/2020   IGA 39 (L) 11/03/2020   IGMSERUM <5 (L) 11/03/2020   Lab Results  Component Value Date   TOTALPROTELP 5.5 (L) 11/03/2020   ALBUMINELP 2.9 11/03/2020   A1GS 0.4 11/03/2020   A2GS 1.3 (H) 11/03/2020   BETS 0.7 11/03/2020   GAMS 0.2 (L) 11/03/2020   MSPIKE 0.1 (H) 11/03/2020     Chemistry      Component Value Date/Time   NA 140 02/23/2021 0919   K 2.9 (L) 02/23/2021 0919   CL 104 02/23/2021 0919   CO2 26 02/23/2021 0919   BUN 5 (L) 02/23/2021 0919   CREATININE 0.55 (L) 02/23/2021 0919      Component Value Date/Time   CALCIUM 7.9 (L) 02/23/2021 0919   ALKPHOS 104 02/23/2021 0919   AST 17 02/23/2021 0919   ALT 13 02/23/2021 0919   BILITOT 0.4 02/23/2021 0919       Impression and Plan: Mr. Villarin is a very pleasant 70 yo gentleman with kappa light  chain myeloma with recent stem cell transplant with [facility] on 01/22/2021.  He is here today due to persistent n/v despite antiemetics and severe weakness/fatigue.  His potassium, magnesium, calcium and total protein/albumin are all low.  Iron studies, Hgb, WBC count and platelets are stable at this time.  I spoke with Dr. [name] and went over patients symptoms and lab work in detail. At this time, we will work to have him direct admitted to [facility] hospital for further eval and treatment (electrolyte replacement, GI consult and PT/OT).  Dr. [name] plans to be the admitting physician.  The patient and his daughter are in agreement with the plan and will go run some errands while waiting for a bed to become available.  We will follow-up after discharged.   Laverna Peace, NP 4/26/202210:40 AM

## 2021-02-23 NOTE — H&P (Signed)
Hematology and Oncology Follow Up Visit  Bruce Smith [id number] Jun 12, 1951 70 y.o. 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 He is quite weak and unable to stand for a weight.  He has had persistent nausea and vomiting with eating. He is able to keep water down but will vomit with Boost or ensure.  He has history of esophageal stricture per daughter and needs to have this stretched.  Oral exam negative for evidence of thrush or stomatitis.  He has abdominal discomfort described as cramping.  No fever, chills, cough, rash, chest pain, palpitations or changes in bowel or bladder habits. He has been taking his Miralax daily and states that his bowels are moving once a day. He is urinating without difficulty.  He has had some dizziness. He had what the describe as a controlled fall in the shower with his wife helping him to the floor due to weakness.  No syncope to report.  No swelling noted in his extremities at this time.  He has not noted any obvious blood loss. No petechiae. Skin on the arms is thin and he bruises easily.   ECOG Performance Status: 3 - Symptomatic, >50% confined to bed  Medications:    Allergies: No Known Allergies  Past Medical History, Surgical history, Social history, and Family History were reviewed and updated.  Review of Systems: All other 10 point review of systems is negative.   Physical Exam:  vitals were not taken for this  visit.   Wt Readings from Last 3 Encounters:  12/09/20 158 lb (71.7 kg)  12/03/20 162 lb (73.5 kg)  11/03/20 169 lb (76.7 kg)    Ocular: Sclerae unicteric, pupils equal, round and reactive to light Ear-nose-throat: Oropharynx clear, dentition fair Lymphatic: No cervical, supraclavicular or axillary adenopathy Lungs no rales or rhonchi, good excursion bilaterally Heart regular rate and rhythm, no murmur appreciated Abd soft, nontender, positive bowel sounds, no liver or spleen tip palpated on exam, no fluid wave  MSK no focal spinal tenderness, no joint edema Neuro: non-focal, well-oriented, appropriate affect Breasts: Deferred   Lab Results  Component Value Date   WBC 5.9 02/23/2021   HGB 11.4 (L) 02/23/2021   HCT 35.2 (L) 02/23/2021   MCV 95.7 02/23/2021   PLT 215 02/23/2021   Lab Results  Component Value Date   FERRITIN 938 (H) 02/23/2021   IRON 67 02/23/2021   TIBC 156 (L) 02/23/2021   UIBC 90 (L) 02/23/2021   IRONPCTSAT 43 02/23/2021   Lab Results  Component Value Date   RBC 3.68 (L) 02/23/2021   Lab Results  Component Value Date   KPAFRELGTCHN 8.2 11/03/2020   LAMBDASER 2.9 (L) 11/03/2020   KAPLAMBRATIO 2.83 (H) 11/03/2020   Lab Results  Component Value Date   IGGSERUM 317 (L) 11/03/2020   IGA 39 (L) 11/03/2020   IGMSERUM <5 (L) 11/03/2020   Lab Results  Component Value Date   TOTALPROTELP  5.5 (L) 11/03/2020   ALBUMINELP 2.9 11/03/2020   A1GS 0.4 11/03/2020   A2GS 1.3 (H) 11/03/2020   BETS 0.7 11/03/2020   GAMS 0.2 (L) 11/03/2020   MSPIKE 0.1 (H) 11/03/2020     Chemistry      Component Value Date/Time   NA 140 02/23/2021 0919   K 2.9 (L) 02/23/2021 0919   CL 104 02/23/2021 0919   CO2 26 02/23/2021 0919   BUN 5 (L) 02/23/2021 0919   CREATININE 0.55 (L) 02/23/2021 0919      Component Value Date/Time   CALCIUM 7.9 (L) 02/23/2021 0919   ALKPHOS 104 02/23/2021 0919   AST 17 02/23/2021 0919   ALT 13 02/23/2021 0919   BILITOT 0.4 02/23/2021  0919       Impression and Plan: Bruce Smith is a very pleasant 70 yo gentleman with kappa light chain myeloma with recent stem cell transplant with [facility] on 01/22/2021.  He is here today due to persistent n/v despite antiemetics and severe weakness/fatigue.  His potassium, magnesium, calcium and total protein/albumin are all low.  Iron studies, Hgb, WBC count and platelets are stable at this time.  I spoke with Dr. [name] and went over patients symptoms and lab work in detail. At this time, we will work to have him direct admitted to [facility] hospital for further eval and treatment (electrolyte replacement, GI consult and PT/OT).  Dr. [name] plans to be the admitting physician.  The patient and his daughter are in agreement with the plan and will go run some errands while waiting for a bed to become available.  We will follow-up after discharged.   Volanda Napoleon, MD 4/26/20221:57 PM

## 2021-02-23 NOTE — H&P (Addendum)
[place]  [phone] [phone]   MEDICAL ONCOLOGY - ADMISSION H&P  Reason for Admission: Failure to thrive  HPI: [name] is followed by our office for kappa light chain myeloma-extensive bone disease-pathologic fracture of left scapula and left femur - t(11:14), 13q-, 1p-,1q-.  He recently had ASCT at [facility] on 01/22/2021.  He was seen in our office earlier today for work in appointment because he is quite weak and unable to stand for a weight.  He has had persistent nausea and vomiting with eating. He is able to keep water down but will vomit with Boost or ensure.  He has history of esophageal stricture per daughter and needs to have this stretched.  Oral exam negative for evidence of thrush or stomatitis.  He has abdominal discomfort described as cramping.  No fever, chills, cough, rash, chest pain, palpitations or changes in bowel or bladder habits. He has been taking his Miralax daily and states that his bowels are moving once a day. He is urinating without difficulty.  He has had some dizziness. He had what the describe as a controlled fall in the shower with his wife helping him to the floor due to weakness.  No syncope to report.  No swelling noted in his extremities at this time.  He has not noted any obvious blood loss. No petechiae. Skin on the arms is thin and he bruises easily.   The patient has been admitted to the hospital for failure to thrive and management of his symptoms.    Past Medical History:  Diagnosis Date  . Atherosclerosis   . Coronary artery disease   . Goals of care, counseling/discussion 06/29/2020  . 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 Hyperlipidemia   . Hypertension   . 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 Multiple myeloma ([place]) 07/21/2020  . Myocardial infarction [facility])   :  Past Surgical History:  Procedure Laterality Date  . ANTERIOR CERVICAL DECOMP/DISCECTOMY FUSION N/A  06/17/2020   Procedure: ANTERIOR CERVICAL DECOMPRESSION/DISCECTOMY FUSION, INTERBODY PROSTHESIS, PLATE/SCREWS CERVICAL FIVE- CERVICAL SIX;  Surgeon: Newman Pies, MD;  Location: [place];  Service: Neurosurgery;  Laterality: N/A;  ANTERIOR CERVICAL DECOMPRESSION/DISCECTOMY FUSION, INTERBODY PROSTHESIS, PLATE/SCREWS CERVICAL FIVE- CERVICAL SIX  . CARDIAC CATHETERIZATION    . FEMUR IM NAIL Left 07/14/2020   Procedure: OPEN REDUCTION INTERNAL FIXATION (ORIF LEFT FEMORAL NAIL FRACTURE;  Surgeon: Marchia Bond, MD;  Location: [place];  Service: Orthopedics;  Laterality: Left;  :  Current Facility-Administered Medications  Medication Dose Route Frequency Provider Last Rate Last Admin  . acyclovir (ZOVIRAX) tablet 400 mg  400 mg Oral BID Volanda Napoleon, MD      . dronabinol (MARINOL) capsule 5 mg  5 mg Oral Daily PRN Volanda Napoleon, MD      . enoxaparin (LOVENOX) injection 40 mg  40 mg Subcutaneous Q24H Volanda Napoleon, MD      . fentaNYL PT72 37.5 mcg  37.5 mcg Transdermal Q72H Volanda Napoleon, MD      . HYDROmorphone (DILAUDID) injection 2 mg  2 mg Intravenous Q2H PRN Volanda Napoleon, MD      . LORazepam (ATIVAN) injection 1 mg  1 mg Intravenous Q6H PRN Volanda Napoleon, MD      . LORazepam (ATIVAN) tablet 0.5 mg  0.5 mg Oral Q6H PRN Volanda Napoleon, MD      . magnesium sulfate IVPB 2 g 50 mL  2 g Intravenous Once Yechiel Erny,  Rudell Cobb, MD      . ondansetron [facility]) 8 mg in sodium chloride 0.9 % 50 mL IVPB  8 mg Intravenous Q8H Curcio, Kristin R, NP      . pantoprazole (PROTONIX) injection 40 mg  40 mg Intravenous Q12H Volanda Napoleon, MD      . pantoprazole (PROTONIX) injection 40 mg  40 mg Intravenous Once Volanda Napoleon, MD      . pregabalin (LYRICA) capsule 100 mg  100 mg Oral Daily Dimitriy Carreras R, MD      . sodium chloride 0.9 % 1,000 mL with thiamine 347 mg, folic acid 1 mg, multivitamins adult 10 mL infusion   Intravenous Once Volanda Napoleon, MD      . tamsulosin (FLOMAX) capsule 0.4  mg  0.4 mg Oral QHS Andalyn Heckstall, Rudell Cobb, MD         No Known Allergies:  No family history on file.:  Social History   Socioeconomic History  . Marital status: Married    Spouse name: Not on file  . Number of children: Not on file  . Years of education: Not on file  . Highest education level: Not on file  Occupational History  . Not on file  Tobacco Use  . Smoking status: Former Smoker    Quit date: 2000    Years since quitting: 22.3  . Smokeless tobacco: Current User    Types: Chew  Vaping Use  . Vaping Use: Never used  Substance and Sexual Activity  . Alcohol use: Not Currently    Comment: rare  . Drug use: Never  . Sexual activity: Not Currently  Other Topics Concern  . Not on file  Social History Narrative  . Not on file   Social Determinants of Health   Financial Resource Strain: Not on file  Food Insecurity: Not on file  Transportation Needs: Not on file  Physical Activity: Not on file  Stress: Not on file  Social Connections: Not on file  Intimate Partner Violence: Not on file  :  Review of Systems: A comprehensive 14 point review of systems was negative except as noted in the HPI.  Exam: Patient Vitals for the past 24 hrs:  BP Temp Temp src Pulse Resp SpO2  02/23/21 1404 107/68 98.1 F (36.7 C) Oral 79 17 99 %   Ocular: Sclerae unicteric, pupils equal, round and reactive to light Ear-nose-throat: Oropharynx clear, dentition fair Lymphatic: No cervical, supraclavicular or axillary adenopathy Lungs no rales or rhonchi, good excursion bilaterally Heart regular rate and rhythm, no murmur appreciated Abd soft, nontender, positive bowel sounds, no liver or spleen tip palpated on exam, no fluid wave  MSK no focal spinal tenderness, no joint edema Neuro: non-focal, well-oriented, appropriate affect  Lab Results  Component Value Date   WBC 5.9 02/23/2021   HGB 11.4 (L) 02/23/2021   HCT 35.2 (L) 02/23/2021   PLT 215 02/23/2021   GLUCOSE 119 (H)  02/23/2021   CHOL 93 10/30/2020   TRIG 167 (H) 10/30/2020   HDL 28 (L) 10/30/2020   LDLCALC 32 10/30/2020   ALT 13 02/23/2021   AST 17 02/23/2021   NA 140 02/23/2021   K 2.9 (L) 02/23/2021   CL 104 02/23/2021   CREATININE 0.55 (L) 02/23/2021   BUN 5 (L) 02/23/2021   CO2 26 02/23/2021    [place] EKG SITE RITE  Result Date: 02/23/2021 If Site Rite image not attached, placement could not be confirmed due to current cardiac rhythm.  [place] EKG SITE RITE  Result Date: 02/23/2021 If Site Rite image not attached, placement could not be confirmed due to current cardiac rhythm.  Assessment and Plan:  Mr. Rijo is a very pleasant 70 yo gentleman with kappa light chain myeloma with recent stem cell transplant with [facility] on 01/22/2021.  He was seen today due to persistent n/v despite antiemetics and severe weakness/fatigue.  His potassium, magnesium, calcium and total protein/albumin are all low.  Iron studies, Hgb, WBC count and platelets are stable at this time.  It was recommend that he be admitted to the hospital for management of his symptoms.   -Begin IV fluids -Electrolyte replacement -GI consult -PT/OT consult -Continue home medications.  He has as needed pain medication available to him.  He also has antiemetics available to him.  Mikey Bussing, DNP, AGPCNP-BC, AOCNP

## 2021-02-23 NOTE — Plan of Care (Signed)

## 2021-02-23 NOTE — Progress Notes (Signed)
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 He is unable to stand, even with the assistance from 2 staff. After his assessment it is decided that he needs inpatient care and will be admitted.   Oncology Nurse Navigator Documentation  Oncology Nurse Navigator Flowsheets 02/23/2021  Abnormal Finding Date -  Confirmed Diagnosis Date -  Diagnosis Status -  Planned Course of Treatment -  Phase of Treatment -  Chemotherapy Actual Start Date: -  Radiation Actual Start Date: -  Radiation Expected End Date: -  Radiation Actual End Date: -  Navigator Follow Up Date: 02/26/2021  Navigator Follow Up Reason: Appointment Review  Navigator Location [facility]  Navigator Encounter Type Follow-up Appt  Telephone -  Patient Visit Type MedOnc  Treatment Phase Post-Tx Follow-up  Barriers/Navigation Needs Coordination of Care;Education;Mobility Issues;Morbidities/Frailty;Pain  Education -  Interventions Coordination of Care  Acuity Level 2-Minimal Needs (1-2 Barriers Identified)  Referrals -  Coordination of Care Other  Education Method -  Support Groups/Services Friends and Family  Time Spent with Patient 30

## 2021-02-23 NOTE — Progress Notes (Signed)
Peripherally Inserted Central Catheter Placement  The IV Nurse has discussed with the patient and/or persons authorized to consent for the patient, the purpose of this procedure and the potential benefits and risks involved with this procedure.  The benefits include less needle sticks, lab draws from the catheter, and the patient may be discharged home with the catheter. Risks include, but not limited to, infection, bleeding, blood clot (thrombus formation), and puncture of an artery; nerve damage and irregular heartbeat and possibility to perform a PICC exchange if needed/ordered by physician.  Alternatives to this procedure were also discussed.  Bard Power PICC patient education guide, fact sheet on infection prevention and patient information card has been provided to patient /or left at bedside.    PICC Placement Documentation  PICC Triple Lumen 71/24/58 PICC Right Basilic 41 cm 0 cm (Active)  Indication for Insertion or Continuance of Line Poor Vasculature-patient has had multiple peripheral attempts or PIVs lasting less than 24 hours;Chronic illness with exacerbations (CF, Sickle Cell, etc.) 02/23/21 2104  Exposed Catheter (cm) 0 cm 02/23/21 2104  Site Assessment Clean;Dry;Intact 02/23/21 2104  Lumen #1 Status Capped (Central line);Flushed;Blood return noted 02/23/21 2104  Lumen #2 Status Capped (Central line);Flushed;Blood return noted 02/23/21 2104  Lumen #3 Status Capped (Central line);Flushed;Blood return noted 02/23/21 2104  Dressing Type Transparent;Securing device 02/23/21 2104  Dressing Status Clean;Dry;Intact 02/23/21 2104  Antimicrobial disc in place? Yes 02/23/21 2104  Safety Lock Not Applicable 09/98/33 8250  Line Care Connections checked and tightened 02/23/21 2104  Line Adjustment (NICU/IV Team Only) No 02/23/21 2104  Dressing Intervention New dressing 02/23/21 2104  Dressing Change Due 03/02/21 02/23/21 2104       Bruce Smith 02/23/2021, 9:05 PM

## 2021-02-24 ENCOUNTER — Inpatient Hospital Stay (HOSPITAL_COMMUNITY): Payer: BC Managed Care – PPO

## 2021-02-24 ENCOUNTER — Inpatient Hospital Stay (HOSPITAL_COMMUNITY): Payer: BC Managed Care – PPO | Admitting: Anesthesiology

## 2021-02-24 ENCOUNTER — Other Ambulatory Visit: Payer: BC Managed Care – PPO | Admitting: Student

## 2021-02-24 ENCOUNTER — Encounter (HOSPITAL_COMMUNITY): Payer: Self-pay | Admitting: Hematology & Oncology

## 2021-02-24 ENCOUNTER — Encounter (HOSPITAL_COMMUNITY)
Admission: AD | Disposition: A | Discharge status: Home / Self Care | Payer: Self-pay | Source: Home / Self Care | Attending: Hematology & Oncology

## 2021-02-24 DIAGNOSIS — Z515 Encounter for palliative care: Secondary | ICD-10-CM | POA: Diagnosis not present

## 2021-02-24 DIAGNOSIS — C9000 Multiple myeloma not having achieved remission: Secondary | ICD-10-CM | POA: Diagnosis not present

## 2021-02-24 DIAGNOSIS — C419 Malignant neoplasm of bone and articular cartilage, unspecified: Secondary | ICD-10-CM | POA: Diagnosis not present

## 2021-02-24 DIAGNOSIS — R531 Weakness: Secondary | ICD-10-CM | POA: Diagnosis not present

## 2021-02-24 DIAGNOSIS — R627 Adult failure to thrive: Secondary | ICD-10-CM | POA: Diagnosis not present

## 2021-02-24 DIAGNOSIS — Z7189 Other specified counseling: Secondary | ICD-10-CM | POA: Diagnosis not present

## 2021-02-24 DIAGNOSIS — Z9484 Stem cells transplant status: Secondary | ICD-10-CM | POA: Diagnosis not present

## 2021-02-24 HISTORY — PX: ESOPHAGOGASTRODUODENOSCOPY: SHX5428

## 2021-02-24 HISTORY — PX: BIOPSY: SHX5522

## 2021-02-24 LAB — COMPREHENSIVE METABOLIC PANEL
ALT: 13 U/L (ref 0–44)
AST: 16 U/L (ref 15–41)
Albumin: 2.3 g/dL — ABNORMAL LOW (ref 3.5–5.0)
Alkaline Phosphatase: 74 U/L (ref 38–126)
Anion gap: 6 (ref 5–15)
BUN: <5 mg/dL — ABNORMAL LOW (ref 8–23)
CO2: 25 mmol/L (ref 22–32)
Calcium: 7.1 mg/dL — ABNORMAL LOW (ref 8.9–10.3)
Chloride: 109 mmol/L (ref 98–111)
Creatinine, Ser: 0.35 mg/dL — ABNORMAL LOW (ref 0.61–1.24)
GFR, Estimated: >60 mL/min (ref >60)
Glucose, Bld: 95 mg/dL (ref 70–99)
Potassium: 3 mmol/L — ABNORMAL LOW (ref 3.5–5.1)
Sodium: 140 mmol/L (ref 135–145)
Total Bilirubin: 0.5 mg/dL (ref 0.3–1.2)
Total Protein: 4.1 g/dL — ABNORMAL LOW (ref 6.5–8.1)

## 2021-02-24 LAB — IGG, IGA, IGM
IgA: 16 mg/dL — ABNORMAL LOW (ref 61–437)
IgG (Immunoglobin G), Serum: 250 mg/dL — ABNORMAL LOW (ref 603–1613)
IgM (Immunoglobulin M), Srm: <5 mg/dL — ABNORMAL LOW (ref 20–172)

## 2021-02-24 LAB — CBC
HCT: 29 % — ABNORMAL LOW (ref 39.0–52.0)
Hemoglobin: 9.5 g/dL — ABNORMAL LOW (ref 13.0–17.0)
MCH: 31.1 pg (ref 26.0–34.0)
MCHC: 32.8 g/dL (ref 30.0–36.0)
MCV: 95.1 fL (ref 80.0–100.0)
Platelets: 128 10*3/uL — ABNORMAL LOW (ref 150–400)
RBC: 3.05 MIL/uL — ABNORMAL LOW (ref 4.22–5.81)
RDW: 15.5 % (ref 11.5–15.5)
WBC: 3.4 10*3/uL — ABNORMAL LOW (ref 4.0–10.5)
nRBC: 0 % (ref 0.0–0.2)

## 2021-02-24 LAB — KAPPA/LAMBDA LIGHT CHAINS
Kappa free light chain: 1 mg/L — ABNORMAL LOW (ref 3.3–19.4)
Kappa, lambda light chain ratio: 0.59 (ref 0.26–1.65)
Lambda free light chains: 1.7 mg/L — ABNORMAL LOW (ref 5.7–26.3)

## 2021-02-24 LAB — SARS CORONAVIRUS 2 (TAT 6-24 HRS): SARS Coronavirus 2: NEGATIVE

## 2021-02-24 IMAGING — MR MR HEAD WO/W CM
14 series · 48 of 48 positions shown · IV contrast (gadavist)
Comparison: Comparison made with previous MRI from [DATE].

CLINICAL DATA: Initial evaluation for persistent nausea and
vomiting, history of multiple myeloma.

EXAM:
MRI HEAD WITHOUT AND WITH CONTRAST
TECHNIQUE: Multiplanar, multiecho pulse sequences of the brain and surrounding
structures were obtained without and with intravenous contrast.
CONTRAST:  6mL GADAVIST GADOBUTROL 1 MMOL/ML IV SOLN

[Series 5: DWI · axial · 3.0mm · 1.36mm/px · z∈[-48,+92]mm · 5 of 96 slices shown (1 of 2)]
[im 1/96]
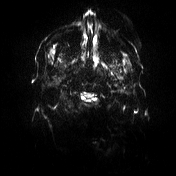
[im 24/96]
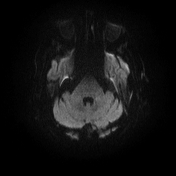
[im 48/96]
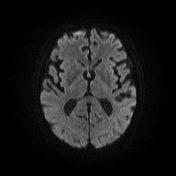
[im 72/96]
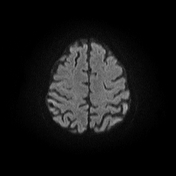
[im 96/96]
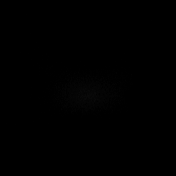

[Series 6: DWI · axial · 3.0mm · 1.36mm/px · z∈[-48,+89]mm · 3 of 47 slices shown (2 of 2)]
[im 1/47]
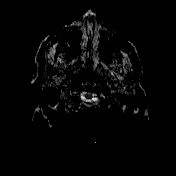
[im 24/47]
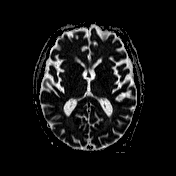
[im 47/47]
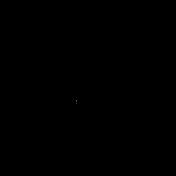

[Series 7: T1 · sagittal · 5.0mm · 0.94mm/px · 1 of 24 slices shown (1 of 2)]
[im 1/24]
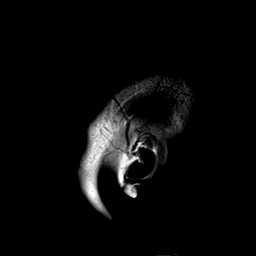

[Series 8: T2 · axial · 5.0mm · 0.94mm/px · z∈[-60,+102]mm · 2 of 26 slices shown (1 of 2)]
[im 1/26]
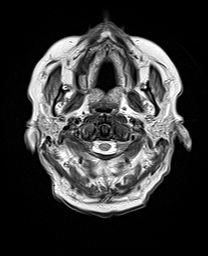
[im 26/26]
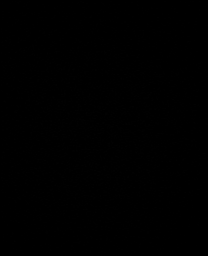

[Series 9: mip_images(sw) · axial · 24.0mm · 0.94mm/px · z∈[-44,+87]mm · 3 of 45 slices shown]
[im 1/45]
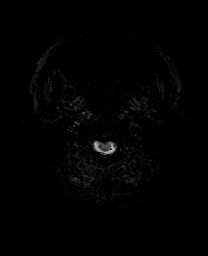
[im 23/45]
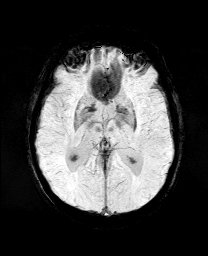
[im 45/45]
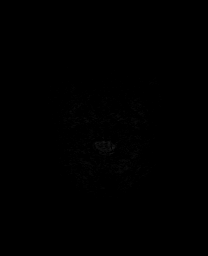

[Series 10: swi_images · axial · 3.0mm · 0.94mm/px · z∈[-55,+97]mm · 3 of 52 slices shown]
[im 1/52]
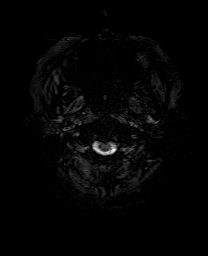
[im 26/52]
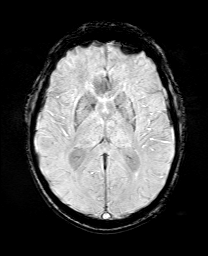
[im 52/52]
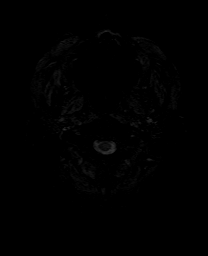

[Series 11: FLAIR · axial · 3.0mm · 0.94mm/px · z∈[-55,+97]mm · 3 of 52 slices shown]
[im 1/52]
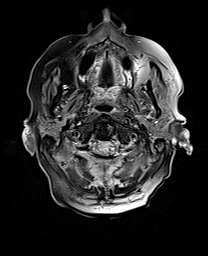
[im 26/52]
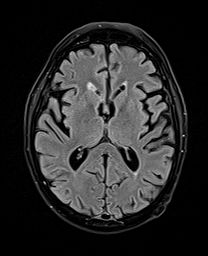
[im 52/52]
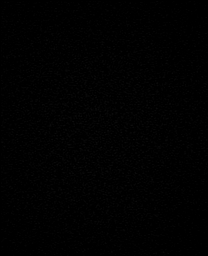

[Series 12: T1 · axial · 1.0mm · 0.94mm/px · z∈[-50,+92]mm · 9 of 144 slices shown (2 of 2)]
[im 1/144]
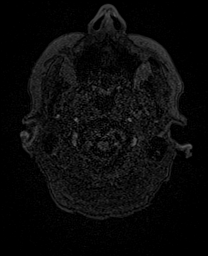
[im 18/144]
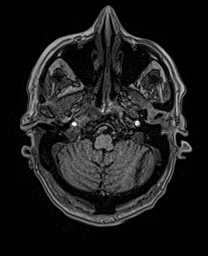
[im 36/144]
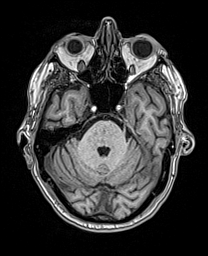
[im 54/144]
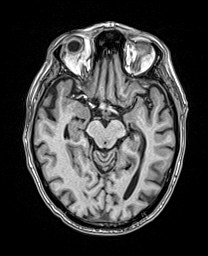
[im 72/144]
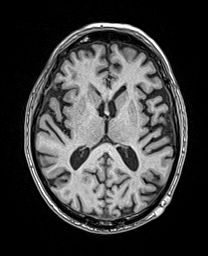
[im 90/144]
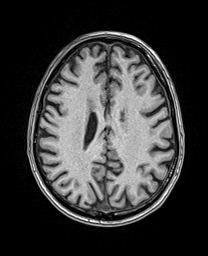
[im 108/144]
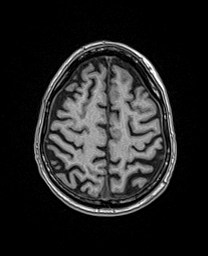
[im 126/144]
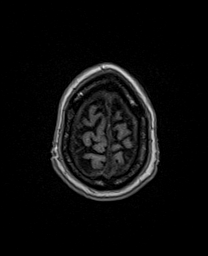
[im 144/144]
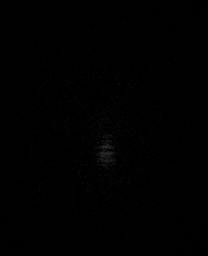

[Series 13: cor dwi_tracew · coronal · 5.0mm · 1.53mm/px · 3 of 58 slices shown]
[im 1/58]
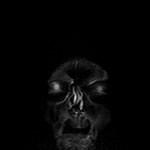
[im 29/58]
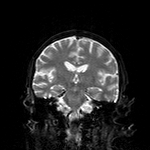
[im 58/58]
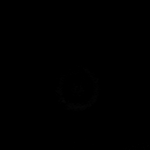

[Series 14: cor dwi_adc · coronal · 5.0mm · 1.53mm/px · 2 of 28 slices shown]
[im 1/28]
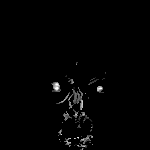
[im 28/28]
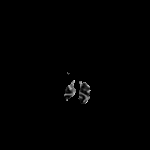

[Series 15: T2 · coronal · 5.0mm · 0.86mm/px · 2 of 37 slices shown (2 of 2)]
[im 1/37]
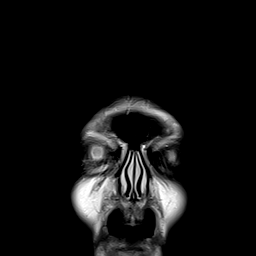
[im 37/37]
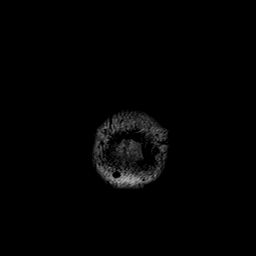

[Series 16: T1 post-contrast · axial · 1.0mm · 0.94mm/px · z∈[-50,+92]mm · 9 of 144 slices shown (1 of 3)]
[im 1/144]
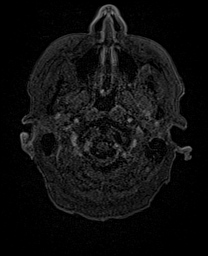
[im 18/144]
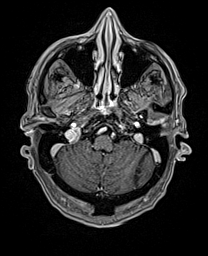
[im 36/144]
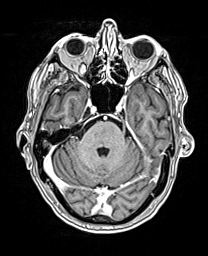
[im 54/144]
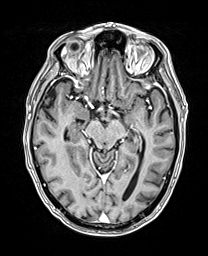
[im 72/144]
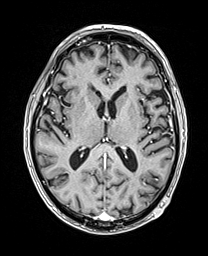
[im 90/144]
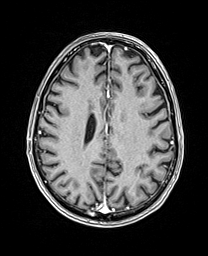
[im 108/144]
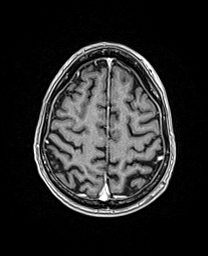
[im 126/144]
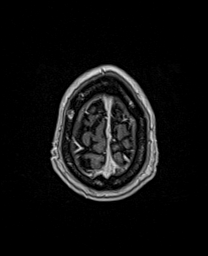
[im 144/144]
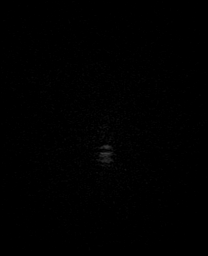

[Series 17: T1 post-contrast · coronal · 5.0mm · 0.43mm/px · 2 of 30 slices shown (2 of 3)]
[im 1/30]
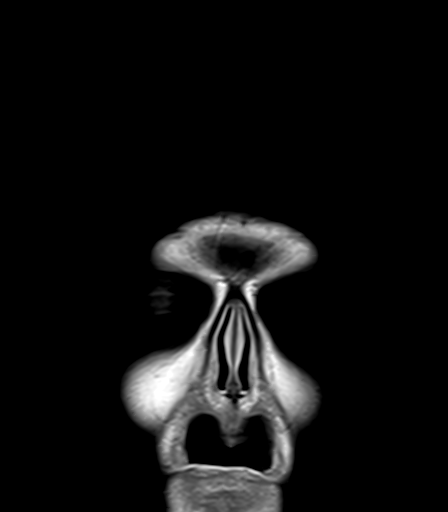
[im 30/30]
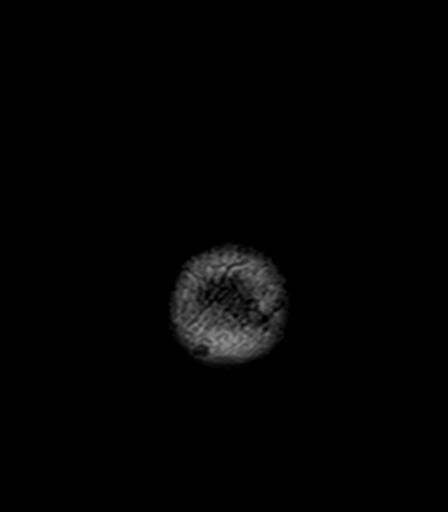

[Series 18: T1 post-contrast · sagittal · 5.0mm · 0.94mm/px · 1 of 24 slices shown (3 of 3)]
[im 1/24]
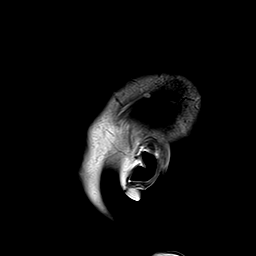

[48 of 48 positions shown; findings below may reference images not displayed]

FINDINGS: Brain: Mild diffuse prominence of the CSF containing spaces
compatible with generalized age-related cerebral atrophy. Patchy
T2/FLAIR signal abnormality involving the periventricular white
matter and pons, most consistent with chronic small vessel ischemic
disease, mild for age, and relatively similar to previous.

No abnormal foci of restricted diffusion to suggest acute or
subacute ischemia. Gray-white matter differentiation maintained. No
encephalomalacia to suggest chronic cortical infarction. No foci of
susceptibility artifact to suggest acute or chronic intracranial
hemorrhage.

No mass lesion, midline shift, or mass effect. No evidence for
intraparenchymal metastatic disease within the brain itself. No
abnormal enhancement. Pituitary gland suprasellar region normal.
Midline structures intact and normal.

Vascular: Major intracranial vascular flow voids are well
maintained.

Skull and upper cervical spine: Craniocervical junction within
normal limits. Again seen are multiple lesions involving the
calvarium, consistent with history of multiple myeloma. These are
seen as follows; 18 mm lesion at the left parietal bone (series 11,
image 41). 9 mm lesion at the right frontal calvarium (series 11,
image 44). 11 mm lesion at the right occipital calvarium (series 11,
image 32). 7 mm lesion at the left temporal calvarium (series 11,
image 28). These are not appreciably changed or progressed as
compared to previous MRI. These lesions abut the underlying dura
without frank dural or intracranial invasion. No significant extra
osseous disease. Few benign appearing T2 hypointense nodular
densities noted at the left occipital scalp, likely benign, and
stable from previous.

Sinuses/Orbits: Globes and orbital soft tissues demonstrate no acute
finding. Paranasal sinuses are largely clear. Trace bilateral
mastoid effusions noted, of doubtful significance. Inner ear
structures grossly normal.

Other: None.
IMPRESSION: 1. No acute intracranial abnormality. No CNS etiology identified to
explain patient's persistent nausea and vomiting.
2. Multiple lytic lesions involving the calvarium as above,
consistent with history of multiple myeloma. Overall, appearance is
not significantly changed or progressed as compared to [DATE].
No other intracranial or intraparenchymal disease within the brain
itself.
3. Mild age-related cerebral atrophy with chronic small vessel
ischemic disease, stable.

## 2021-02-24 SURGERY — EGD (ESOPHAGOGASTRODUODENOSCOPY)
Anesthesia: Monitor Anesthesia Care

## 2021-02-24 MED ORDER — CALCIUM GLUCONATE-NACL 2-0.675 GM/100ML-% IV SOLN
2.0000 g | Freq: Once | INTRAVENOUS | Status: AC
  Administered 2021-02-24: 2000 mg via INTRAVENOUS
  Filled 2021-02-24: qty 100

## 2021-02-24 MED ORDER — POTASSIUM CHLORIDE 10 MEQ/50ML IV SOLN
10.0000 meq | INTRAVENOUS | Status: AC
  Administered 2021-02-24 (×4): 10 meq via INTRAVENOUS
  Filled 2021-02-24 (×4): qty 50

## 2021-02-24 MED ORDER — PROPOFOL 10 MG/ML IV BOLUS
INTRAVENOUS | Status: DC | PRN
  Administered 2021-02-24: 40 mg via INTRAVENOUS

## 2021-02-24 MED ORDER — SODIUM CHLORIDE 0.9 % IV SOLN: INTRAVENOUS | Status: DC

## 2021-02-24 MED ORDER — PROPOFOL 500 MG/50ML IV EMUL
INTRAVENOUS | Status: DC | PRN
  Administered 2021-02-24: 125 ug/kg/min via INTRAVENOUS

## 2021-02-24 MED ORDER — SENNOSIDES-DOCUSATE SODIUM 8.6-50 MG PO TABS
1.0000 | ORAL_TABLET | Freq: Every evening | ORAL | Status: DC | PRN
  Administered 2021-02-24: 1 via ORAL
  Filled 2021-02-24: qty 1

## 2021-02-24 MED ORDER — GADOBUTROL 1 MMOL/ML IV SOLN
6.0000 mL | Freq: Once | INTRAVENOUS | Status: AC | PRN
  Administered 2021-02-24: 6 mL via INTRAVENOUS

## 2021-02-24 MED ORDER — LIDOCAINE HCL (CARDIAC) PF 100 MG/5ML IV SOSY
PREFILLED_SYRINGE | INTRAVENOUS | Status: DC | PRN
  Administered 2021-02-24: 100 mg via INTRAVENOUS

## 2021-02-24 NOTE — Anesthesia Postprocedure Evaluation (Signed)
Anesthesia Post Note  Patient: Bruce Smith  Procedure(s) Performed: ESOPHAGOGASTRODUODENOSCOPY (EGD) (N/A )     Patient location during evaluation: PACU Anesthesia Type: MAC Level of consciousness: awake and alert Pain management: pain level controlled Vital Signs Assessment: post-procedure vital signs reviewed and stable Respiratory status: spontaneous breathing, nonlabored ventilation, respiratory function stable and patient connected to nasal cannula oxygen Cardiovascular status: stable and blood pressure returned to baseline Postop Assessment: no apparent nausea or vomiting Anesthetic complications: no   No complications documented.  Last Vitals:  Vitals:   02/24/21 1225 02/24/21 1235  BP: 126/71 137/75  Pulse: 79 77  Resp: 12 12  Temp:    SpO2: 99% 98%    Last Pain:  Vitals:   02/24/21 1235  TempSrc:   PainSc: 0-No pain                 Belenda Cruise P Joyice Magda

## 2021-02-24 NOTE — Progress Notes (Signed)
Bruce Smith is about the same.  He says he is feeling a little bit better.  Maybe the IV fluids are helping.  Hopefully, he will be seen by Gastroenterology today.  He needs to have an upper endoscopy to see why he has this persistent nausea and vomiting.  Only to get a MRI of his brain just to make sure there is no CNS etiology for the nausea and vomiting.  He is not having diarrhea.  He does have some white cells in his urine.  I will check urinalysis on him to make sure that there is no urine culture.  I will see if physical therapy and Occupational Therapy can try to help with his mobility.  We will see if palliative care might be able to help with his medications for pain.  I do not know if this might be an issue with his persistent nausea and vomiting.  I am grateful for the IV team who put in the PICC line.  This will make life easier for right now.  I thought about possibly starting TNA on him.  His labs show white cell count 3.4.  Hemoglobin 9.5.  Platelet count 128,000.  His potassium is 3.  BUN is less than 5 creatinine 0.35.  Calcium is only 7.1.  His albumin is only 2.3.  I will go and give him some IV potassium.  His vital signs show a temperature of 97.8.  Pulse 81.  Blood pressure 102/57.  Oxygen saturation is 95%.  His oral exam shows no thrush.  Neck is supple with no adenopathy.  Lungs are clear.  Cardiac exam regular rate and rhythm.  Abdomen is soft.  Bowel sounds are somewhat decreased.  There is no fluid wave.  There is no guarding or rebound tenderness.  There is no palpable liver or spleen tip.  Extremity shows no clubbing, cyanosis or edema.  Neurological exam shows no focal deficits.  For right now, we really have to just see what the gastroenterology can do for [place].  He is on IV fluids.  He will get some extra potassium.  I will also give him some IV calcium.  He really needs a lot of rehab.  He is quite weak.  We will see if palliative care might be able to help  with pain management.  It is tough to get a "read" on how bad the pain really is.  I want to try to hold off on starting TNA right now.  I want to see what gastroenterology can find.  I would hate to have to put a NG tube him to feed him.  We still have a long way to go before he is even considered Kandice Robinsons for discharge.  We will see what MRI of the brain shows.  We will recheck his labs in the morning.  I know he will get fantastic care from all the staff on 6 E.  Lattie Haw, MD  Psalm 37:5

## 2021-02-24 NOTE — Progress Notes (Signed)
Initial Nutrition Assessment  INTERVENTION:   -Needs weight for admission (last recorded weight from 2/19  -Monitor for diet advancement vs nutrition support plan  NUTRITION DIAGNOSIS:   Increased nutrient needs related to cancer and cancer related treatments as evidenced by estimated needs.  GOAL:   Patient will meet greater than or equal to 90% of their needs  MONITOR:   PO intake,Supplement acceptance,Labs,Weight trends,I & O's  REASON FOR ASSESSMENT:   Malnutrition Screening Tool    ASSESSMENT:   70 y.o patient followed by oncology for kappa light chain myeloma-extensive bone disease-pathologic fracture of left scapula and left femur - t(11:14), 13q-, 1p-,1q-.  He recently had ASCT at [facility] on 01/22/2021.    He has had persistent nausea and vomiting with eating. He is able to keep water down but will vomit with Boost or ensure.The patient has been admitted to the hospital for failure to thrive and management of his symptoms.  RD alerted to pt's admission from [facility] RD who last spoke with pt's wife on 4/25 and 4/26. Per review of those notes, pt has had continued weight loss. UBW is 232 lbs. Has been having N/V. Poor appetite on Marinol. Has been able to tolerate saltines and banana but no protein rich foods. Pt hasn't been able to tolerate Boost or Ensures. RD at that time questioned if enteral nutrition would be tolerated and if TPN needs to be initiated.  Noted in chart that GI assessed pt and now pt is having EGD. Will monitor for plan following procedure.   Per nursing documentation, pt with mild BLE edema.  No weight was obtained for admission. Last recorded weight is from February 2022. Needs updated weight to full assess weight status. Had significant weight loss prior to February. Suspect malnutrition to be present.  Medications: Zofran, KCl, Marinol  Labs reviewed:  Low K  NUTRITION - FOCUSED PHYSICAL EXAM:  Unable to complete at this time.  Diet  Order:   Diet Order            Diet NPO time specified  Diet effective midnight                 EDUCATION NEEDS:   No education needs have been identified at this time  Skin:  Skin Assessment: Reviewed RN Assessment  Last BM:  4/25  Height:   Ht Readings from Last 1 Encounters:  12/09/20 5\' 8"  (1.727 m)    Weight:   Wt Readings from Last 1 Encounters:  12/09/20 71.7 kg   BMI:  There is no height or weight on file to calculate BMI.  Estimated Nutritional Needs:   Kcal:  2100-2300  Protein:  105-115g  Fluid:  2.3L/day  Clayton Bibles, MS, RD, LDN Inpatient Clinical Dietitian Contact information available via Amion

## 2021-02-24 NOTE — Evaluation (Signed)
Occupational Therapy Evaluation Patient Details Name: Bruce Smith MRN: [id number] DOB: October 28, 1951 Today's Date: 02/24/2021    History of Present Illness Patient is a 70 year old male with PMH significant for esophageal stricture and myeloma-extensive bone disease-pathologic fracture of left scapula and left femur. Pt recently had ASCT at [facility] on 01/22/2021.  Patient admitted due to weakness and inability to stand at doctor's office. Patient also with persistent nausea and vomiting with eating. S/p EGD 4/27   Clinical Impression   Patient lives at home with spouse in a two story home, can stay on main level and 2 steps to enter. Patient's spouse works from home and his sister comes over to assist while spouse is working. Patient's functional activity has been wax/weaning for past few months due to ongoing medical issues including hx of falls + fractures. Patient reports significant weight loss and difficulty eating due to N/V while eating. Overall patient min A for functional ambulation with walker due to unsteadiness and limited activity tolerance, set up for UB ADL and mod A for LB ADLs patient as patient able to doff sock with increased effort but unable to don. Recommend continued acute OT services to maximize patient strength, endurance and education on adaptive equipment in order to maximize safety and independence with ADLs. Patient and family expressing desire for home health, was set up after patient left [facility] however had not been able to start since being home.    Follow Up Recommendations  Home health OT;Supervision/Assistance - 24 hour    Equipment Recommendations  Other (comment) (long handle AE)       Precautions / Restrictions Precautions Precautions: Fall Precaution Comments: hx falls resulting in scapula, hip and tib fracture within past 4 months Restrictions Weight Bearing Restrictions: No      Mobility Bed Mobility Overal bed mobility: Modified Independent                   Transfers Overall transfer level: Needs assistance Equipment used: Rolling walker (2 wheeled) Transfers: Sit to/from Stand Sit to Stand: Min guard;Min assist         General transfer comment: min G to power up, min A with ambulation    Balance Overall balance assessment: History of Falls;Needs assistance Sitting-balance support: Feet supported Sitting balance-Leahy Scale: Fair     Standing balance support: Bilateral upper extremity supported Standing balance-Leahy Scale: Poor Standing balance comment: reliant on walker and OT min A                           ADL either performed or assessed with clinical judgement   ADL Overall ADL's : Needs assistance/impaired     Grooming: Set up;Sitting   Upper Body Bathing: Set up;Sitting   Lower Body Bathing: Moderate assistance;Sitting/lateral leans;Sit to/from stand   Upper Body Dressing : Set up;Sitting   Lower Body Dressing: Moderate assistance;Sitting/lateral leans;Sit to/from stand Lower Body Dressing Details (indicate cue type and reason): seated EOB patient able to doff sock with increased time/effort, unable to don Toilet Transfer: Minimal assistance;Ambulation;RW Toilet Transfer Details (indicate cue type and reason): min A as patient is mildly unsteady, able to power up to standing from edge of bed with min G assist. Toileting- Clothing Manipulation and Hygiene: Moderate assistance;Sit to/from stand;Sitting/lateral lean       Functional mobility during ADLs: Minimal assistance;Cueing for safety;Rolling walker  Pertinent Vitals/Pain Pain Assessment: No/denies pain     Hand Dominance Right   Extremity/Trunk Assessment Upper Extremity Assessment Upper Extremity Assessment: Generalized weakness   Lower Extremity Assessment Lower Extremity Assessment: Defer to PT evaluation   Cervical / Trunk Assessment Cervical / Trunk Assessment: Normal   Communication  Communication Communication: No difficulties   Cognition Arousal/Alertness: Awake/alert Behavior During Therapy: WFL for tasks assessed/performed Overall Cognitive Status: Within Functional Limits for tasks assessed                                                Home Living Family/patient expects to be discharged to:: Private residence Living Arrangements: Spouse/significant other Available Help at Discharge: Family;Available 24 hours/day Type of Home: House Home Access: Stairs to enter [employer] of Steps: 2   Home Layout: Two level;Able to live on main level with bedroom/bathroom     Bathroom Shower/Tub: Occupational psychologist: Standard     Home Equipment: Shower seat - built in;Hand held Tourist information centre manager - 4 wheels;Bedside commode   Additional Comments: Has an adjustable bed      Prior Functioning/Environment Level of Independence: Needs assistance  Gait / Transfers Assistance Needed: ambulating with walker when has enough strength ADL's / Homemaking Assistance Needed: family assisting with dressing, bathing, toileting as patient's strength wax and weans            OT Problem List: Decreased strength;Decreased activity tolerance;Impaired balance (sitting and/or standing)      OT Treatment/Interventions: Self-care/ADL training;Therapeutic exercise;Therapeutic activities;Patient/family education;Balance training    OT Goals(Current goals can be found in the care plan section) Acute Rehab OT Goals Patient Stated Goal: eat to get stronger OT Goal Formulation: With patient/family Time For Goal Achievement: 03/10/21 Potential to Achieve Goals: Good  OT Frequency: Min 2X/week    AM-PAC OT "6 Clicks" Daily Activity     Outcome Measure Help from another person eating meals?: None Help from another person taking care of personal grooming?: A Little Help from another person toileting, which includes using toliet, bedpan, or  urinal?: A Lot Help from another person bathing (including washing, rinsing, drying)?: A Lot Help from another person to put on and taking off regular upper body clothing?: A Little Help from another person to put on and taking off regular lower body clothing?: A Lot 6 Click Score: 16   End of Session Equipment Utilized During Treatment: Rolling walker Nurse Communication: Mobility status  Activity Tolerance: Patient tolerated treatment well Patient left: in chair;with call bell/phone within reach;with chair alarm set;with family/visitor present  OT Visit Diagnosis: Muscle weakness (generalized) (M62.81);History of falling (Z91.81);Unsteadiness on feet (R26.81)                Time: 4696-2952 OT Time Calculation (min): 21 min Charges:  OT General Charges $OT Visit: 1 Visit OT Evaluation $OT Eval Low Complexity: [place] OT OT pager: [place] 02/24/2021, 3:16 PM

## 2021-02-24 NOTE — Transfer of Care (Signed)
Immediate Anesthesia Transfer of Care Note  Patient: Bruce Smith  Procedure(s) Performed: ESOPHAGOGASTRODUODENOSCOPY (EGD) (N/A )  Patient Location: PACU and Endoscopy Unit  Anesthesia Type:MAC  Level of Consciousness: awake and drowsy  Airway & Oxygen Therapy: Patient Spontanous Breathing and Patient connected to face mask oxygen  Post-op Assessment: Report given to RN and Post -op Vital signs reviewed and stable  Post vital signs: Reviewed and stable  Last Vitals:  Vitals Value Taken Time  BP 95/55 02/24/21 1206  Temp 37.2 C 02/24/21 1206  Pulse 77 02/24/21 1208  Resp 9 02/24/21 1208  SpO2 100 % 02/24/21 1208  Vitals shown include unvalidated device data.  Last Pain:  Vitals:   02/24/21 1206  TempSrc: Temporal  PainSc:          Complications: No complications documented.

## 2021-02-24 NOTE — Progress Notes (Signed)
PT Cancellation Note  Patient Details Name: Bruce Smith MRN: [id number] DOB: 10/09/1951   Cancelled Treatment:    Reason Eval/Treat Not Completed: Patient at procedure or test/unavailable (pt at endoscopy. Will follow.)  Philomena Doheny PT 02/24/2021  Acute Rehabilitation Services Pager [phone] Office [phone]

## 2021-02-24 NOTE — Consult Note (Signed)
Consultation Note Date: 02/24/2021   Patient Name: Bruce Smith  DOB: June 21, 1951  MRN: [id number]  Age / Sex: 70 y.o., male  PCP: Bruce Center., MD Referring Physician: Volanda Napoleon, MD  Reason for Consultation: Establishing goals of care  HPI/Patient Profile: 70 y.o. male  admitted on 02/23/2021   Clinical Assessment and Goals of Care:  70 year old patient with kappa light chain myeloma with recent ASCT at [facility] in March 2022 was seen by oncology yesterday for difficult oral intake.  Was complaining of nausea, failure to thrive, history of esophageal stricture s/p dilation in the past, admitted to hospital, IV fluids and electrolyte replacements and GI consult. Possible EGD today, possible MRI brain today.   A palliative consult has been requested for pain management options.   [name] is resting in bed, his sister Bruce Smith is at the bedside. I introduced myself and palliative care as follows: Palliative medicine is specialized medical care for people living with serious illness. It focuses on providing relief from the symptoms and stress of a serious illness. The goal is to improve quality of life for both the patient and the family.  Goals of care: Broad aims of medical therapy in relation to the patient's values and preferences. Our aim is to provide medical care aimed at enabling patients to achieve the goals that matter most to them, given the circumstances of their particular medical situation and their constraints.   Medication history noted, discussed with patient and sister Bruce Smith. Patient states that overall, his pain control has been adequate, how ever, he has not been able to keep anything down. He feels weak, but overall better than when he came in. We talked about his current hospital course and upcoming procedures of MRI brain as well as EGD.   See below.  NEXT OF KIN  sister Bruce Smith at bedside,  lives at home with wife in [place], [place]. Has grown children.   SUMMARY OF RECOMMENDATIONS    Pain management: patient on trans dermal fentanyl, at home he was on PO Dilaudid, on IV Dilaudid PRN here, has not required breakthrough pain medication, continue to monitor opioid analgesic needs over here. Lyrica as an adjuvant.  Current anti emetic regimen consists of scheduled Zofran, also on low dose Benzos.  MRI brain and EGD today.  Thank you for the consult.   Code Status/Advance Care Planning:  Full code    Symptom Management:      Palliative Prophylaxis:   Delirium Protocol  Additional Recommendations (Limitations, Scope, Preferences):  Full Scope Treatment  Psycho-social/Spiritual:   Desire for further Chaplaincy support:yes  Additional Recommendations: Caregiving  Support/Resources  Prognosis:   Unable to determine  Discharge Planning: To Be Determined      Primary Diagnoses: Present on Admission: . FTT (failure to thrive) in adult   I have reviewed the medical record, interviewed the patient and family, and examined the patient. The following aspects are pertinent.  Past Medical History:  Diagnosis Date  . Atherosclerosis   . Coronary artery disease   .  Goals of care, counseling/discussion 06/29/2020  . 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 Hyperlipidemia   . Hypertension   . 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 Multiple myeloma ([place]) 07/21/2020  . Myocardial infarction [facility])    Social History   Socioeconomic History  . Marital status: Married    Spouse name: Not on file  . Number of children: Not on file  . Years of education: Not on file  . Highest education level: Not on file  Occupational History  . Not on file  Tobacco Use  . Smoking status: Former Smoker    Quit date: 2000    Years since quitting: 22.3  . Smokeless tobacco: Current User    Types: Chew  Vaping Use  . Vaping Use: Never used  Substance  and Sexual Activity  . Alcohol use: Not Currently    Comment: rare  . Drug use: Never  . Sexual activity: Not Currently  Other Topics Concern  . Not on file  Social History Narrative  . Not on file   Social Determinants of Health   Financial Resource Strain: Not on file  Food Insecurity: Not on file  Transportation Needs: Not on file  Physical Activity: Not on file  Stress: Not on file  Social Connections: Not on file   History reviewed. No pertinent family history. Scheduled Meds: . acyclovir  400 mg Oral BID  . Chlorhexidine Gluconate Cloth  6 each Topical Daily  . enoxaparin (LOVENOX) injection  40 mg Subcutaneous Q24H  . [START ON 02/26/2021] fentaNYL  1 patch Transdermal Q72H  . [START ON 02/26/2021] fentaNYL  1 patch Transdermal Q72H  . pantoprazole (PROTONIX) IV  40 mg Intravenous Q12H  . pantoprazole (PROTONIX) IV  40 mg Intravenous Once  . pregabalin  100 mg Oral Daily  . sodium chloride flush  10-40 mL Intracatheter Q12H  . tamsulosin  0.4 mg Oral QHS   Continuous Infusions: . sodium chloride 20 mL/hr at 02/24/21 1448  . calcium gluconate 2,000 mg (02/24/21 1449)  . ondansetron [facility]) IV Stopped (02/24/21 0641)   PRN Meds:.dronabinol, HYDROmorphone (DILAUDID) injection, LORazepam, sodium chloride flush Medications Prior to Admission:  Prior to Admission medications   Medication Sig Start Date End Date Taking? Authorizing Provider  acyclovir (ZOVIRAX) 400 MG tablet Take 400 mg by mouth 2 (two) times daily. 02/10/21  Yes [provider]  dicyclomine (BENTYL) 10 MG capsule Take 1 capsule (10 mg total) by mouth 4 (four) times daily -  before meals and at bedtime. 02/17/21  Yes Bruce Napoleon, MD  dronabinol (MARINOL) 5 MG capsule Take 5 mg by mouth daily as needed (nausea). 08/11/20  Yes [provider]  fentaNYL 37.5 MCG/HR PT72 Place 37.5 mcg onto the skin every 3 (three) days. 12/02/20 12/02/21 Yes [provider]  FLOVENT HFA 220 MCG/ACT  inhaler Inhale into the lungs 2 (two) times daily. Inhale 4 puffs twice daily 02/10/21  Yes [provider]  HYDROmorphone (DILAUDID) 4 MG tablet Take 4 mg by mouth every 8 (eight) hours as needed for severe pain. 0630, 1430, 2200 02/10/21  Yes [provider]  LORazepam (ATIVAN) 0.5 MG tablet Take 1 tablet (0.5 mg total) by mouth every 6 (six) hours as needed for anxiety (nausea). 07/21/20  Yes Bruce Napoleon, MD  naloxone [facility]) nasal spray 4 mg/0.1 mL Place 1 spray into the nose once. 10/14/20  Yes [provider]  nitroGLYCERIN (NITROSTAT) 0.4 MG SL tablet Place 0.4  mg under the tongue every 5 (five) minutes as needed for chest pain.   Yes [provider]  omeprazole (PRILOSEC) 40 MG capsule Take 40 mg by mouth daily. 08/06/20 08/06/21 Yes [provider]  ondansetron (ZOFRAN) 8 MG tablet TAKE 1 TABLET BY MOUTH EVERY 8 HOURS AS NEEDED NAUSEA AND VOMITING Patient taking differently: Take 8 mg by mouth every 8 (eight) hours as needed for nausea. 02/15/21  Yes Ennever, Rudell Cobb, MD  polyethylene glycol (MIRALAX) 17 g packet Take 17 g by mouth daily. Patient taking differently: Take 17 g by mouth daily as needed for mild constipation. 11/01/20  Yes Nita Sells, MD  pregabalin (LYRICA) 100 MG capsule Take 1 capsule (100 mg total) by mouth daily. 12/30/20  Yes Bruce Napoleon, MD  pregabalin (LYRICA) 150 MG capsule Take 150 mg by mouth at bedtime.   Yes [provider]  promethazine (PHENERGAN) 12.5 MG tablet Take 12.5 mg by mouth every 6 (six) hours as needed for nausea. 02/10/21  Yes [provider]  sennosides-docusate sodium (SENOKOT-S) 8.6-50 MG tablet Take 2 tablets by mouth daily. Patient taking differently: Take 1 tablet by mouth daily as needed for constipation. 07/14/20  Yes Merlene Pulling K, PA-C  tamsulosin (FLOMAX) 0.4 MG CAPS capsule Take 0.4 mg by mouth daily. 12/01/20  Yes [provider]  aprepitant (EMEND) 80 MG  capsule Take 1 capsule (80 mg total) by mouth daily. Start the day after chemotherapy. 02/17/21   Bruce Napoleon, MD  famciclovir (FAMVIR) 500 MG tablet Take 1 tablet (500 mg total) by mouth daily. Patient not taking: No sig reported 08/11/20   Bruce Napoleon, MD  megestrol (MEGACE ES) 625 MG/5ML suspension Take 5 mLs (625 mg total) by mouth daily. Patient not taking: No sig reported 10/28/20   Bruce Napoleon, MD  metoCLOPramide (REGLAN) 10 MG tablet Take 1 tablet (10 mg total) by mouth 4 (four) times daily -  before meals and at bedtime. Patient not taking: No sig reported 11/03/20   Bruce Napoleon, MD  silver sulfADIAZINE (SILVADENE) 1 % cream Apply 1 application topically 2 (two) times daily. Patient not taking: No sig reported 10/06/20   Bruce Napoleon, MD   No Known Allergies Review of Systems +nausea Pain reasonably well controlled.   Physical Exam Awake alert Resting in bed No distress Regular work of breathing S 1 S 2  Abdomen mildly distended and mildly tender No edema No focal deficits Appears with generalized weakness.   Vital Signs: BP 122/72 (BP Location: Right Arm)   Pulse 87   Temp 98.1 F (36.7 C) (Oral)   Resp 16   SpO2 99%  Pain Scale: 0-10   Pain Score: 0-No pain   SpO2: SpO2: 99 % O2 Device:SpO2: 99 % O2 Flow Rate: .O2 Flow Rate (L/min): 6 L/min  IO: Intake/output summary:   Intake/Output Summary (Last 24 hours) at 02/24/2021 1531 Last data filed at 02/24/2021 1500 Gross per 24 hour  Intake 326.53 ml  Output 1350 ml  Net -1023.47 ml    LBM: Last BM Date: 02/22/21 Baseline Weight:   Most recent weight:       Palliative Assessment/Data:   PPS 40%  Time In:  9 Time Out:10   Time Total: 60   Greater than 50%  of this time was spent counseling and coordinating care related to the above assessment and plan.  Signed by: Loistine Chance, MD   Please contact Palliative Medicine Team phone at  [phone] for questions and concerns.  For  individual provider: See Shea Evans

## 2021-02-24 NOTE — Anesthesia Preprocedure Evaluation (Signed)
Anesthesia Evaluation  Patient identified by MRN, date of birth, ID band Patient awake    Reviewed: Allergy & Precautions, NPO status , Patient's Chart, lab work & pertinent test results  Airway Mallampati: II  TM Distance: >3 FB Neck ROM: Full    Dental  (+) Upper Dentures, Lower Dentures   Pulmonary neg pulmonary ROS, former smoker,    Pulmonary exam normal        Cardiovascular hypertension, Pt. on medications + CAD and + Past MI   Rhythm:Regular Rate:Normal     Neuro/Psych negative neurological ROS  negative psych ROS   GI/Hepatic Neg liver ROS, GERD  Medicated,Esophageal stricture    Endo/Other  negative endocrine ROS  Renal/GU negative Renal ROS  negative genitourinary   Musculoskeletal  (+) Arthritis ,   Abdominal (+)  Abdomen: soft.    Peds  Hematology negative hematology ROS (+)   Anesthesia Other Findings   Reproductive/Obstetrics                             Anesthesia Physical Anesthesia Plan  ASA: III  Anesthesia Plan: MAC   Post-op Pain Management:    Induction: Intravenous  PONV Risk Score and Plan: 1 and Propofol infusion and Treatment may vary due to age or medical condition  Airway Management Planned: Simple Face Mask, Natural Airway and Nasal Cannula  Additional Equipment: None  Intra-op Plan:   Post-operative Plan:   Informed Consent: I have reviewed the patients History and Physical, chart, labs and discussed the procedure including the risks, benefits and alternatives for the proposed anesthesia with the patient or authorized representative who has indicated his/her understanding and acceptance.     Dental advisory given  Plan Discussed with: CRNA  Anesthesia Plan Comments:         Anesthesia Quick Evaluation

## 2021-02-24 NOTE — Consult Note (Signed)
Referring Provider: Dr. [name] (Oncology) Primary Care Physician:  Derrill Center., MD Primary Gastroenterologist:  Althia Forts  Reason for Consultation:  Nausea/vomiting  HPI: Bruce Smith is a 70 y.o. 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  Patient reports nausea ongoing for 3-6 months.  He states he has a poor appetite and early satiety and has regurgitation of foodstuffs.  He is unable to tolerate much oral intake.  He denies dysphagia.  He reports weight loss of ~130 lbs since August 2021, with weight loss of 12 lbs in the last 2 weeks.  He reports chronically loose stools, which he attributes to lack of solid food consumption.  He denies melena or hematochezia.  He has occasional lower abdominal cramping.  He has never had an EGD or colonoscopy. Per chart review, pt's daughter reported history of esophageal stricture, but patient denies known prior esophageal stricture.  He denies current/recent ASA, blood thinner, or NSAID use.  Past Medical History:  Diagnosis Date  . Atherosclerosis   . Coronary artery disease   . Goals of care, counseling/discussion 06/29/2020  . 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 Hyperlipidemia   . Hypertension   . 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 Multiple myeloma ([place]) 07/21/2020  . Myocardial infarction [facility])     Past Surgical History:  Procedure Laterality Date  . ANTERIOR CERVICAL DECOMP/DISCECTOMY FUSION N/A 06/17/2020   Procedure: ANTERIOR CERVICAL DECOMPRESSION/DISCECTOMY FUSION, INTERBODY PROSTHESIS, PLATE/SCREWS CERVICAL FIVE- CERVICAL SIX;  Surgeon: Newman Pies, MD;  Location: [place];  Service: Neurosurgery;  Laterality: N/A;  ANTERIOR CERVICAL DECOMPRESSION/DISCECTOMY FUSION, INTERBODY PROSTHESIS, PLATE/SCREWS CERVICAL FIVE- CERVICAL SIX  . CARDIAC CATHETERIZATION    . FEMUR IM NAIL Left 07/14/2020   Procedure: OPEN  REDUCTION INTERNAL FIXATION (ORIF LEFT FEMORAL NAIL FRACTURE;  Surgeon: Marchia Bond, MD;  Location: [place];  Service: Orthopedics;  Laterality: Left;    Prior to Admission medications   Medication Sig Start Date End Date Taking? Authorizing Provider  acyclovir (ZOVIRAX) 400 MG tablet Take 400 mg by mouth 2 (two) times daily. 02/10/21  Yes [provider]  dicyclomine (BENTYL) 10 MG capsule Take 1 capsule (10 mg total) by mouth 4 (four) times daily -  before meals and at bedtime. 02/17/21  Yes Volanda Napoleon, MD  dronabinol (MARINOL) 5 MG capsule Take 5 mg by mouth daily as needed (nausea). 08/11/20  Yes [provider]  fentaNYL 37.5 MCG/HR PT72 Place 37.5 mcg onto the skin every 3 (three) days. 12/02/20 12/02/21 Yes [provider]  FLOVENT HFA 220 MCG/ACT inhaler Inhale into the lungs 2 (two) times daily. Inhale 4 puffs twice daily 02/10/21  Yes [provider]  HYDROmorphone (DILAUDID) 4 MG tablet Take 4 mg by mouth every 8 (eight) hours as needed for severe pain. 0630, 1430, 2200 02/10/21  Yes [provider]  LORazepam (ATIVAN) 0.5 MG tablet Take 1 tablet (0.5 mg total) by mouth every 6 (six) hours as needed for anxiety (nausea). 07/21/20  Yes Volanda Napoleon, MD  naloxone [facility]) nasal spray 4 mg/0.1 mL Place 1 spray into the nose once. 10/14/20  Yes [provider]  nitroGLYCERIN (NITROSTAT) 0.4 MG SL tablet Place 0.4 mg under the tongue every 5 (five) minutes as needed for chest pain.   Yes [provider]  omeprazole (PRILOSEC) 40 MG capsule Take 40 mg by mouth daily. 08/06/20 08/06/21 Yes [provider]  ondansetron (ZOFRAN) 8  MG tablet TAKE 1 TABLET BY MOUTH EVERY 8 HOURS AS NEEDED NAUSEA AND VOMITING Patient taking differently: Take 8 mg by mouth every 8 (eight) hours as needed for nausea. 02/15/21  Yes Ennever, Rudell Cobb, MD  polyethylene glycol (MIRALAX) 17 g packet Take 17 g by mouth daily. Patient taking differently:  Take 17 g by mouth daily as needed for mild constipation. 11/01/20  Yes Nita Sells, MD  pregabalin (LYRICA) 100 MG capsule Take 1 capsule (100 mg total) by mouth daily. 12/30/20  Yes Volanda Napoleon, MD  pregabalin (LYRICA) 150 MG capsule Take 150 mg by mouth at bedtime.   Yes [provider]  promethazine (PHENERGAN) 12.5 MG tablet Take 12.5 mg by mouth every 6 (six) hours as needed for nausea. 02/10/21  Yes [provider]  sennosides-docusate sodium (SENOKOT-S) 8.6-50 MG tablet Take 2 tablets by mouth daily. Patient taking differently: Take 1 tablet by mouth daily as needed for constipation. 07/14/20  Yes Merlene Pulling K, PA-C  tamsulosin (FLOMAX) 0.4 MG CAPS capsule Take 0.4 mg by mouth daily. 12/01/20  Yes [provider]  aprepitant (EMEND) 80 MG capsule Take 1 capsule (80 mg total) by mouth daily. Start the day after chemotherapy. 02/17/21   Volanda Napoleon, MD  famciclovir (FAMVIR) 500 MG tablet Take 1 tablet (500 mg total) by mouth daily. Patient not taking: No sig reported 08/11/20   Volanda Napoleon, MD  megestrol (MEGACE ES) 625 MG/5ML suspension Take 5 mLs (625 mg total) by mouth daily. Patient not taking: No sig reported 10/28/20   Volanda Napoleon, MD  metoCLOPramide (REGLAN) 10 MG tablet Take 1 tablet (10 mg total) by mouth 4 (four) times daily -  before meals and at bedtime. Patient not taking: No sig reported 11/03/20   Volanda Napoleon, MD  silver sulfADIAZINE (SILVADENE) 1 % cream Apply 1 application topically 2 (two) times daily. Patient not taking: No sig reported 10/06/20   Volanda Napoleon, MD    Scheduled Meds: . acyclovir  400 mg Oral BID  . Chlorhexidine Gluconate Cloth  6 each Topical Daily  . enoxaparin (LOVENOX) injection  40 mg Subcutaneous Q24H  . [START ON 02/26/2021] fentaNYL  1 patch Transdermal Q72H  . [START ON 02/26/2021] fentaNYL  1 patch Transdermal Q72H  . pantoprazole (PROTONIX) IV  40 mg Intravenous Q12H  . pantoprazole  (PROTONIX) IV  40 mg Intravenous Once  . pregabalin  100 mg Oral Daily  . sodium chloride flush  10-40 mL Intracatheter Q12H  . tamsulosin  0.4 mg Oral QHS   Continuous Infusions: . calcium gluconate    . ondansetron (ZOFRAN) IV Stopped (02/24/21 0641)  . potassium chloride 10 mEq (02/24/21 0833)   PRN Meds:.dronabinol, HYDROmorphone (DILAUDID) injection, LORazepam, sodium chloride flush  Allergies as of 02/23/2021  . (No Known Allergies)    History reviewed. No pertinent family history.  Social History   Socioeconomic History  . Marital status: Married    Spouse name: Not on file  . Number of children: Not on file  . Years of education: Not on file  . Highest education level: Not on file  Occupational History  . Not on file  Tobacco Use  . Smoking status: Former Smoker    Quit date: 2000    Years since quitting: 22.3  . Smokeless tobacco: Current User    Types: Chew  Vaping Use  . Vaping Use: Never used  Substance and Sexual Activity  . Alcohol use: Not  Currently    Comment: rare  . Drug use: Never  . Sexual activity: Not Currently  Other Topics Concern  . Not on file  Social History Narrative  . Not on file   Social Determinants of Health   Financial Resource Strain: Not on file  Food Insecurity: Not on file  Transportation Needs: Not on file  Physical Activity: Not on file  Stress: Not on file  Social Connections: Not on file  Intimate Partner Violence: Not on file    Review of Systems: Review of Systems  Constitutional: Positive for malaise/fatigue and weight loss.  HENT: Negative for sore throat.   Eyes: Negative for pain and redness.  Respiratory: Negative for cough, shortness of breath and stridor.   Cardiovascular: Negative for chest pain and palpitations.  Gastrointestinal: Positive for diarrhea, nausea and vomiting. Negative for abdominal pain, blood in stool, constipation, heartburn and melena.  Genitourinary: Negative for flank pain and  hematuria.  Musculoskeletal: Negative for falls and myalgias.  Skin: Negative for itching and rash.  Neurological: Positive for weakness. Negative for loss of consciousness.  Endo/Heme/Allergies: Negative for polydipsia. Does not bruise/bleed easily.  Psychiatric/Behavioral: Negative for substance abuse. The patient is not nervous/anxious.      Physical Exam: Vital signs: Vitals:   02/24/21 0025 02/24/21 0500  BP: 112/63 (!) 102/57  Pulse: 86 81  Resp: 18 16  Temp: 98.2 F (36.8 C) 97.8 F (36.6 C)  SpO2: 96% 95%   Last BM Date: 02/22/21  Physical Exam Constitutional:      General: He is not in acute distress.    Appearance: He is ill-appearing (chronically).  HENT:     Head: Normocephalic and atraumatic.     Nose: Nose normal. No congestion.     Mouth/Throat:     Mouth: Mucous membranes are moist.     Pharynx: Oropharynx is clear.  Eyes:     General: No scleral icterus.    Extraocular Movements: Extraocular movements intact.     Conjunctiva/sclera: Conjunctivae normal.  Cardiovascular:     Rate and Rhythm: Normal rate and regular rhythm.     Pulses: Normal pulses.  Pulmonary:     Effort: Pulmonary effort is normal. No respiratory distress.  Abdominal:     General: Bowel sounds are normal. There is no distension.     Palpations: Abdomen is soft. There is no mass.     Tenderness: There is no abdominal tenderness. There is no rebound.     Hernia: No hernia is present.  Musculoskeletal:        General: No swelling or tenderness.     Cervical back: Normal range of motion and neck supple.  Skin:    General: Skin is warm and dry.  Neurological:     General: No focal deficit present.     Mental Status: He is oriented to person, place, and time. He is lethargic.  Psychiatric:        Mood and Affect: Mood normal.        Behavior: Behavior normal. Behavior is cooperative.      GI:  Lab Results: Recent Labs    02/23/21 0919 02/24/21 0551  WBC 5.9 3.4*  HGB  11.4* 9.5*  HCT 35.2* 29.0*  PLT 215 128*   BMET Recent Labs    02/23/21 0919 02/24/21 0551  NA 140 140  K 2.9* 3.0*  CL 104 109  CO2 26 25  GLUCOSE 119* 95  BUN 5* <5*  CREATININE 0.55* 0.35*  CALCIUM 7.9* 7.1*   LFT Recent Labs    02/24/21 0551  PROT 4.1*  ALBUMIN 2.3*  AST 16  ALT 13  ALKPHOS 74  BILITOT 0.5   PT/INR No results for input(s): LABPROT, INR in the last 72 hours.   Studies/Results: [place] EKG SITE RITE  Result Date: 02/23/2021 If Site Rite image not attached, placement could not be confirmed due to current cardiac rhythm.   Impression: Nausea/vomiting, poor appetite, early satiety.  Possibly related to chemotherapy, though GI source not ruled out. No signs of GI bleeding. -Hgb 9.5, decreased from baseline 12-13 in 12/2020 -Normal renal function: BUN <5/Cr 0/35 -Hypoalbuminemia  Kappa light chain myeloma with extensive bone disease  Plan: EGD today to rule out gastric etiology of symptoms. I thoroughly discussed the procedure with the patient to include nature, alternatives, benefits, and risks (including but not limited to bleeding, infection, perforation, anesthesia/cardiac and pulmonary complications). Patient verbalized understanding and gave verbal consent to proceed with EGD.   LOS: 1 day   Salley Slaughter  PA-C 02/24/2021, 8:49 AM  Contact #  [phone]

## 2021-02-24 NOTE — Brief Op Note (Signed)
02/23/2021 - 02/24/2021  12:07 PM  PATIENT:  Bruce Smith  70 y.o. male  PRE-OPERATIVE DIAGNOSIS:  nausea/vomiting, history of esophageal stricture  POST-OPERATIVE DIAGNOSIS:  Duodenakl Bx, Gastric Bx, Esophageal Bx  PROCEDURE:  Procedure(s): ESOPHAGOGASTRODUODENOSCOPY (EGD) (N/A)  SURGEON:  Surgeon(s) and Role:    * Makella Buckingham, MD - Primary  Findings --------- -EGD essentially normal except for mild abnormal mucosa in the D2 which could be from lymphangiectasia.    Recommendations ------------------------ -Start soft diet and advance as tolerated -Follow pathology  -No further inpatient GI work-up planned.  GI will sign off.  Call us back if needed  Otis Brace MD, [place] 02/24/2021, 12:08 PM  Contact #  [phone]

## 2021-02-24 NOTE — Op Note (Signed)
[facility] Patient Name: Bruce Smith Procedure Date: 02/24/2021 MRN: [id number] Attending MD: Otis Brace , MD Date of Birth: 1951-07-31 CSN: [id number] Age: 70 Admit Type: Inpatient Procedure:                Upper GI endoscopy Indications:              Odynophagia, Nausea Providers:                Otis Brace, MD, Truddie Coco, RN, Tyna Jaksch                            Technician Referring MD:              Medicines:                Sedation Administered by an Anesthesia Professional Complications:            No immediate complications. Estimated Blood Loss:     Estimated blood loss was minimal. Procedure:                Pre-Anesthesia Assessment:                           - Prior to the procedure, a History and Physical                            was performed, and patient medications and                            allergies were reviewed. The patient's tolerance of                            previous anesthesia was also reviewed. The risks                            and benefits of the procedure and the sedation                            options and risks were discussed with the patient.                            All questions were answered, and informed consent                            was obtained. Prior Anticoagulants: The patient has                            taken no previous anticoagulant or antiplatelet                            agents. ASA Grade Assessment: II - A patient with                            mild systemic disease. After reviewing the risks  and benefits, the patient was deemed in                            satisfactory condition to undergo the procedure.                           After obtaining informed consent, the endoscope was                            passed under direct vision. Throughout the                            procedure, the patient's blood pressure, pulse, and                             oxygen saturations were monitored continuously. The                            GIF-H190 ([id number]) Olympus gastroscope was                            introduced through the mouth, and advanced to the                            second part of duodenum. The upper GI endoscopy was                            accomplished without difficulty. The patient                            tolerated the procedure well. Scope In: Scope Out: Findings:      The Z-line was regular and was found 40 cm from the incisors.      Normal mucosa was found in the entire esophagus. Biopsies were obtained       from the proximal and distal esophagus with cold forceps for histology       of suspected eosinophilic esophagitis.      Normal mucosa was found in the entire examined stomach. Biopsies were       taken with a cold forceps for histology.      The cardia and gastric fundus were normal on retroflexion.      The duodenal bulb and first portion of the duodenum were normal.      Localized mild mucosal changes characterized by congestion and altered       texture were found in the second portion of the duodenum. Biopsies were       taken with a cold forceps for histology. Impression:               - Z-line regular, 40 cm from the incisors.                           - Normal mucosa was found in the entire esophagus.                            Biopsied.                           -  Normal mucosa was found in the entire stomach.                            Biopsied.                           - Normal duodenal bulb and first portion of the                            duodenum.                           - Mucosal changes in the duodenum. Biopsied. Moderate Sedation:      Moderate (conscious) sedation was personally administered by an       anesthesia professional. The following parameters were monitored: oxygen       saturation, heart rate, blood pressure, and response to care. Recommendation:           - Return patient  to hospital ward for ongoing care.                           - Soft diet.                           - Continue present medications.                           - Await pathology results. Procedure Code(s):        --- Professional ---                           [phone], Esophagogastroduodenoscopy, flexible,                            transoral; with biopsy, single or multiple Diagnosis Code(s):        --- Professional ---                           K31.89, Other diseases of stomach and duodenum                           R13.10, Dysphagia, unspecified                           R11.0, Nausea CPT copyright 2019 [employer]. All rights reserved. The codes documented in this report are preliminary and upon coder review may  be revised to meet current compliance requirements. Otis Brace, MD Otis Brace, MD 02/24/2021 12:05:28 PM Number of Addenda: 0

## 2021-02-25 ENCOUNTER — Encounter (HOSPITAL_COMMUNITY): Payer: Self-pay | Admitting: Gastroenterology

## 2021-02-25 ENCOUNTER — Telehealth: Payer: Self-pay | Admitting: *Deleted

## 2021-02-25 DIAGNOSIS — E876 Hypokalemia: Secondary | ICD-10-CM | POA: Diagnosis not present

## 2021-02-25 DIAGNOSIS — R109 Unspecified abdominal pain: Secondary | ICD-10-CM | POA: Diagnosis not present

## 2021-02-25 DIAGNOSIS — C419 Malignant neoplasm of bone and articular cartilage, unspecified: Secondary | ICD-10-CM | POA: Diagnosis not present

## 2021-02-25 DIAGNOSIS — C9000 Multiple myeloma not having achieved remission: Secondary | ICD-10-CM | POA: Diagnosis not present

## 2021-02-25 LAB — SURGICAL PATHOLOGY

## 2021-02-25 LAB — CBC WITH DIFFERENTIAL/PLATELET
Abs Immature Granulocytes: 0.06 10*3/uL (ref 0.00–0.07)
Basophils Absolute: 0 10*3/uL (ref 0.0–0.1)
Basophils Relative: 1 %
Eosinophils Absolute: 0 10*3/uL (ref 0.0–0.5)
Eosinophils Relative: 0 %
HCT: 30.8 % — ABNORMAL LOW (ref 39.0–52.0)
Hemoglobin: 9.9 g/dL — ABNORMAL LOW (ref 13.0–17.0)
Immature Granulocytes: 1 %
Lymphocytes Relative: 22 %
Lymphs Abs: 1.3 10*3/uL (ref 0.7–4.0)
MCH: 30.7 pg (ref 26.0–34.0)
MCHC: 32.1 g/dL (ref 30.0–36.0)
MCV: 95.4 fL (ref 80.0–100.0)
Monocytes Absolute: 0.8 10*3/uL (ref 0.1–1.0)
Monocytes Relative: 14 %
Neutro Abs: 3.8 10*3/uL (ref 1.7–7.7)
Neutrophils Relative %: 62 %
Platelets: 156 10*3/uL (ref 150–400)
RBC: 3.23 MIL/uL — ABNORMAL LOW (ref 4.22–5.81)
RDW: 15.5 % (ref 11.5–15.5)
WBC: 6 10*3/uL (ref 4.0–10.5)
nRBC: 0 % (ref 0.0–0.2)

## 2021-02-25 LAB — PROTEIN ELECTROPHORESIS, SERUM, WITH REFLEX
A/G Ratio: 1.6 (ref 0.7–1.7)
Albumin ELP: 2.8 g/dL — ABNORMAL LOW (ref 2.9–4.4)
Alpha-1-Globulin: 0.3 g/dL (ref 0.0–0.4)
Alpha-2-Globulin: 0.8 g/dL (ref 0.4–1.0)
Beta Globulin: 0.6 g/dL — ABNORMAL LOW (ref 0.7–1.3)
Gamma Globulin: 0.2 g/dL — ABNORMAL LOW (ref 0.4–1.8)
Globulin, Total: 1.8 g/dL — ABNORMAL LOW (ref 2.2–3.9)
Total Protein ELP: 4.6 g/dL — ABNORMAL LOW (ref 6.0–8.5)

## 2021-02-25 LAB — COMPREHENSIVE METABOLIC PANEL
ALT: 12 U/L (ref 0–44)
AST: 17 U/L (ref 15–41)
Albumin: 2.5 g/dL — ABNORMAL LOW (ref 3.5–5.0)
Alkaline Phosphatase: 93 U/L (ref 38–126)
Anion gap: 6 (ref 5–15)
BUN: 5 mg/dL — ABNORMAL LOW (ref 8–23)
CO2: 23 mmol/L (ref 22–32)
Calcium: 7.5 mg/dL — ABNORMAL LOW (ref 8.9–10.3)
Chloride: 111 mmol/L (ref 98–111)
Creatinine, Ser: 0.51 mg/dL — ABNORMAL LOW (ref 0.61–1.24)
GFR, Estimated: >60 mL/min (ref >60)
Glucose, Bld: 110 mg/dL — ABNORMAL HIGH (ref 70–99)
Potassium: 3.7 mmol/L (ref 3.5–5.1)
Sodium: 140 mmol/L (ref 135–145)
Total Bilirubin: 0.6 mg/dL (ref 0.3–1.2)
Total Protein: 4.3 g/dL — ABNORMAL LOW (ref 6.5–8.1)

## 2021-02-25 LAB — MAGNESIUM: Magnesium: 1.8 mg/dL (ref 1.7–2.4)

## 2021-02-25 MED ORDER — DRONABINOL 5 MG PO CAPS
5.0000 mg | ORAL_CAPSULE | Freq: Two times a day (BID) | ORAL | Status: DC
  Administered 2021-02-26 – 2021-03-03 (×12): 5 mg via ORAL
  Filled 2021-02-25 (×12): qty 1

## 2021-02-25 MED ORDER — CALCIUM GLUCONATE-NACL 2-0.675 GM/100ML-% IV SOLN
2.0000 g | Freq: Once | INTRAVENOUS | Status: AC
  Administered 2021-02-25: 2000 mg via INTRAVENOUS
  Filled 2021-02-25: qty 100

## 2021-02-25 MED ORDER — HYOSCYAMINE SULFATE 0.125 MG/ML PO SOLN
0.2500 mg | ORAL | Status: DC | PRN
Start: 2021-02-25 — End: 2021-02-26
  Filled 2021-02-25: qty 2

## 2021-02-25 NOTE — Telephone Encounter (Signed)
Per 02/23/21 los - Follow-up per MD once discharged from hospital.

## 2021-02-25 NOTE — Progress Notes (Signed)
The upper endoscopy was totally normal.  I appreciate gastroenterology's help with this.  His MRI of the brain is also normal.  Is still hard to say as to why he has the nausea.  He seemed to be eating a little bit better yesterday.  Had a little bit of diarrhea.  I think he may have gotten some Senokot at dinnertime to try to go to the bathroom.  He is being worked on by physical therapy and Occupational Therapy.  I totally appreciate their efforts.  He has had no obvious bleeding.  His labs look okay.  His calcium is still on the low side.  It is 7.5.  I will give him another dose of IV calcium.  His magnesium is 1.8.  His albumin is low but better at 2.5.  His white cell count is 6.  Platelet count 156,000.  Hemoglobin 9.9.  We are still awaiting the urine culture.  His vital signs all look pretty stable.  Temperature 98.  Pulse 90.  Blood pressure 101/55.  There really is no change in his physical exam.  Again, he is still too weak to be able to go home.  I still am not convinced that he is eating enough to go home.  We will have to see how he does today with his oral intake.  Maybe, we can advance his diet if he does okay today.  I very much appreciate the outstanding care he is getting from all staff on [facility] E.  The staff are so compassionate and really are doing a good job with him.  Lattie Haw, MD  Lurena Joiner 1:37

## 2021-02-25 NOTE — Evaluation (Signed)
Physical Therapy Evaluation Patient Details Name: Bruce Smith MRN: [id number] DOB: [birth year] Today's Date: 02/25/2021   History of Present Illness  Patient is a 70 year old male with PMH significant for esophageal stricture and myeloma-extensive bone disease-pathologic fracture of left scapula and left femur. Pt recently had ASCT at [facility] on 01/22/2021.  Patient admitted due to weakness and inability to stand at doctor's office. Patient also with persistent nausea and vomiting with eating. S/p EGD 4/27    Clinical Impression  Bruce Smith is 70 y.o. male admitted with above HPI and diagnosis. Patient is currently limited by functional impairments below (see PT problem list). Patient lives with spouse and has good support from family. He has been limited over the last several months with mobility due to ongoing medical issues and increased falls. He has been requiring increased assist for ADL's and uses RW for short household mobility as he is limited by LE pain. Pt agreeable and motivated to participate in therapy today and completed multiple sit<>stands and short bouts of gait with use of RW and min assist to steady and prevent LOB. Pt's LE's fatigued quickly and pt stepping with flexed hips/knees and flexed posture. Patient will benefit from continued skilled PT interventions to address impairments and progress independence with mobility, recommending HHPT follow (see below for specific HHA per pt/family request. Acute PT will follow and progress as able.     Follow Up Recommendations Home health PT (pt wants to continue with [facility] out of [facility])    Equipment Recommendations  None recommended by PT    Recommendations for Other Services       Precautions / Restrictions Precautions Precautions: Fall Precaution Comments: hx falls resulting in scapula, hip and tib fracture within past 4 months Restrictions Weight Bearing Restrictions: No      Mobility  Bed Mobility Overal bed  mobility: Modified Independent             General bed mobility comments: pt with HOB slightly elevated and taking extra time to move supine<>sit EOB.    Transfers Overall transfer level: Needs assistance Equipment used: Rolling walker (2 wheeled) Transfers: Sit to/from Stand Sit to Stand: Min guard         General transfer comment: Min guard for safety with power up and pt demonstrate good awareness with reach back to control lowering. Rise from EOB and recliner.  Ambulation/Gait Ambulation/Gait assistance: Min assist Gait Distance (Feet): 16 Feet (10, 6) Assistive device: Rolling walker (2 wheeled) Gait Pattern/deviations: Step-through pattern;Decreased stride length;Shuffle Gait velocity: decr   General Gait Details: Cues for proximity to RW. Pt fatiguing quickly and knees/hips flexed during gait. Min assist requried to prevent LOB and steady.Pt limited by LE pain and performed 2 short bouts in hallway.  Stairs            Wheelchair Mobility    Modified Rankin (Stroke Patients Only)       Balance Overall balance assessment: History of Falls;Needs assistance Sitting-balance support: Feet supported Sitting balance-Leahy Scale: Fair     Standing balance support: Bilateral upper extremity supported Standing balance-Leahy Scale: Poor Standing balance comment: reliant on walker and external support from therapist                             Pertinent Vitals/Pain Pain Assessment: Faces Faces Pain Scale: Hurts even more Pain Location: LE's and feet with mobility Pain Descriptors / Indicators: Aching;Discomfort Pain Intervention(s): Monitored during  session;Limited activity within patient's tolerance    Home Living Family/patient expects to be discharged to:: Private residence Living Arrangements: Spouse/significant other Available Help at Discharge: Family;Available 24 hours/day Type of Home: House Home Access: Stairs to enter;Ramped  entrance Entrance Stairs-Rails: Right Entrance Stairs-Number of Steps: 3 Home Layout: Two level;Able to live on main level with bedroom/bathroom;Full bath on main level Home Equipment: Shower seat - built in;Hand held Tourist information centre manager - 4 wheels;Bedside commode;Grab bars - tub/shower;Grab bars - toilet;Walker - 2 wheels;Cane - single point Additional Comments: Has an adjustable bed    Prior Function Level of Independence: Needs assistance   Gait / Transfers Assistance Needed: ambulating with walker when has enough strength  ADL's / Homemaking Assistance Needed: family assisting with dressing, bathing, toileting as patient's strength wax and weans        Hand Dominance   Dominant Hand: Right    Extremity/Trunk Assessment   Upper Extremity Assessment Upper Extremity Assessment: Defer to OT evaluation    Lower Extremity Assessment Lower Extremity Assessment: Generalized weakness    Cervical / Trunk Assessment Cervical / Trunk Assessment: Normal  Communication   Communication: No difficulties  Cognition Arousal/Alertness: Awake/alert Behavior During Therapy: WFL for tasks assessed/performed Overall Cognitive Status: Within Functional Limits for tasks assessed                                        General Comments      Exercises     Assessment/Plan    PT Assessment Patient needs continued PT services  PT Problem List Decreased strength;Decreased activity tolerance;Decreased balance;Decreased mobility;Decreased knowledge of use of DME;Pain       PT Treatment Interventions DME instruction;Gait training;Stair training;Functional mobility training;Therapeutic activities;Therapeutic exercise;Balance training;Patient/family education    PT Goals (Current goals can be found in the Care Plan section)  Acute Rehab PT Goals Patient Stated Goal: get stronger PT Goal Formulation: With patient/family Time For Goal Achievement: 03/11/21 Potential to Achieve  Goals: Good    Frequency Min 3X/week   Barriers to discharge        Co-evaluation               AM-PAC PT "6 Clicks" Mobility  Outcome Measure Help needed turning from your back to your side while in a flat bed without using bedrails?: None Help needed moving from lying on your back to sitting on the side of a flat bed without using bedrails?: None Help needed moving to and from a bed to a chair (including a wheelchair)?: A Little Help needed standing up from a chair using your arms (e.g., wheelchair or bedside chair)?: A Little Help needed to walk in hospital room?: A Little Help needed climbing 3-5 steps with a railing? : A Lot 6 Click Score: 19    End of Session Equipment Utilized During Treatment: Gait belt Activity Tolerance: Patient tolerated treatment well Patient left: in bed;with call bell/phone within reach;with family/visitor present Nurse Communication: Mobility status PT Visit Diagnosis: Muscle weakness (generalized) (M62.81);Difficulty in walking, not elsewhere classified (R26.2);Unsteadiness on feet (R26.81);Pain Pain - Right/Left:  (bil) Pain - part of body: Leg    Time: 1731-1750 PT Time Calculation (min) (ACUTE ONLY): 19 min   Charges:   PT Evaluation $PT Eval Moderate Complexity: 1 Mod          Verner Mould, DPT Acute Rehabilitation Services Office [phone] Pager [phone]  Jacques Navy 02/25/2021, 7:01 PM

## 2021-02-26 DIAGNOSIS — C9000 Multiple myeloma not having achieved remission: Secondary | ICD-10-CM | POA: Diagnosis not present

## 2021-02-26 DIAGNOSIS — R109 Unspecified abdominal pain: Secondary | ICD-10-CM | POA: Diagnosis not present

## 2021-02-26 DIAGNOSIS — E876 Hypokalemia: Secondary | ICD-10-CM | POA: Diagnosis not present

## 2021-02-26 DIAGNOSIS — C419 Malignant neoplasm of bone and articular cartilage, unspecified: Secondary | ICD-10-CM | POA: Diagnosis not present

## 2021-02-26 LAB — CBC WITH DIFFERENTIAL/PLATELET
Abs Immature Granulocytes: 0.04 10*3/uL (ref 0.00–0.07)
Basophils Absolute: 0 10*3/uL (ref 0.0–0.1)
Basophils Relative: 1 %
Eosinophils Absolute: 0 10*3/uL (ref 0.0–0.5)
Eosinophils Relative: 1 %
HCT: 26.7 % — ABNORMAL LOW (ref 39.0–52.0)
Hemoglobin: 8.9 g/dL — ABNORMAL LOW (ref 13.0–17.0)
Immature Granulocytes: 1 %
Lymphocytes Relative: 24 %
Lymphs Abs: 0.8 10*3/uL (ref 0.7–4.0)
MCH: 31.7 pg (ref 26.0–34.0)
MCHC: 33.3 g/dL (ref 30.0–36.0)
MCV: 95 fL (ref 80.0–100.0)
Monocytes Absolute: 0.6 10*3/uL (ref 0.1–1.0)
Monocytes Relative: 19 %
Neutro Abs: 1.7 10*3/uL (ref 1.7–7.7)
Neutrophils Relative %: 54 %
Platelets: 120 10*3/uL — ABNORMAL LOW (ref 150–400)
RBC: 2.81 MIL/uL — ABNORMAL LOW (ref 4.22–5.81)
RDW: 15.4 % (ref 11.5–15.5)
WBC: 3.2 10*3/uL — ABNORMAL LOW (ref 4.0–10.5)
nRBC: 0 % (ref 0.0–0.2)

## 2021-02-26 LAB — COMPREHENSIVE METABOLIC PANEL
ALT: 11 U/L (ref 0–44)
AST: 13 U/L — ABNORMAL LOW (ref 15–41)
Albumin: 2.3 g/dL — ABNORMAL LOW (ref 3.5–5.0)
Alkaline Phosphatase: 77 U/L (ref 38–126)
Anion gap: 5 (ref 5–15)
BUN: <5 mg/dL — ABNORMAL LOW (ref 8–23)
CO2: 24 mmol/L (ref 22–32)
Calcium: 7.6 mg/dL — ABNORMAL LOW (ref 8.9–10.3)
Chloride: 109 mmol/L (ref 98–111)
Creatinine, Ser: 0.43 mg/dL — ABNORMAL LOW (ref 0.61–1.24)
GFR, Estimated: >60 mL/min (ref >60)
Glucose, Bld: 90 mg/dL (ref 70–99)
Potassium: 3.3 mmol/L — ABNORMAL LOW (ref 3.5–5.1)
Sodium: 138 mmol/L (ref 135–145)
Total Bilirubin: 0.4 mg/dL (ref 0.3–1.2)
Total Protein: 3.9 g/dL — ABNORMAL LOW (ref 6.5–8.1)

## 2021-02-26 LAB — PHOSPHORUS: Phosphorus: 2.4 mg/dL — ABNORMAL LOW (ref 2.5–4.6)

## 2021-02-26 LAB — MAGNESIUM: Magnesium: 1.7 mg/dL (ref 1.7–2.4)

## 2021-02-26 MED ORDER — PREGABALIN 50 MG PO CAPS
100.0000 mg | ORAL_CAPSULE | Freq: Two times a day (BID) | ORAL | Status: DC
  Administered 2021-02-26 – 2021-03-02 (×8): 100 mg via ORAL
  Filled 2021-02-26 (×8): qty 2

## 2021-02-26 MED ORDER — SODIUM CHLORIDE 0.9 % IV SOLN
INTRAVENOUS | Status: DC
Start: 2021-02-26 — End: 2021-03-04

## 2021-02-26 MED ORDER — HYOSCYAMINE SULFATE 0.125 MG PO TBDP
0.2500 mg | ORAL_TABLET | Freq: Four times a day (QID) | ORAL | Status: DC
  Administered 2021-02-26 – 2021-03-04 (×22): 0.25 mg via ORAL
  Filled 2021-02-26 (×24): qty 2

## 2021-02-26 MED ORDER — PANTOPRAZOLE SODIUM 40 MG PO TBEC
40.0000 mg | DELAYED_RELEASE_TABLET | Freq: Two times a day (BID) | ORAL | Status: DC
  Administered 2021-02-26 – 2021-03-03 (×12): 40 mg via ORAL
  Filled 2021-02-26 (×12): qty 1

## 2021-02-26 MED ORDER — CALCIUM GLUCONATE-NACL 2-0.675 GM/100ML-% IV SOLN
2.0000 g | INTRAVENOUS | Status: AC
  Administered 2021-02-26 (×2): 2000 mg via INTRAVENOUS
  Filled 2021-02-26 (×2): qty 100

## 2021-02-26 MED ORDER — SODIUM CHLORIDE 0.9 % IV SOLN: 4.0000 g | Freq: Once | INTRAVENOUS | Status: DC

## 2021-02-26 NOTE — Progress Notes (Signed)
Physical Therapy Treatment Patient Details Name: Bruce Smith MRN: [id number] DOB: 03-18-1951 Today's Date: 02/26/2021    History of Present Illness Patient is a 70 year old male with PMH significant for esophageal stricture and myeloma-extensive bone disease-pathologic fracture of left scapula and left femur. Pt recently had ASCT at [facility] on 01/22/2021.  Patient admitted due to weakness and inability to stand at doctor's office. Patient also with persistent nausea and vomiting with eating. S/p EGD 4/27.     PT Comments    Patient is progressing well with acute PT and demonstrated good management of rollator for gait with cues for safe brake management. Patient ambulated ~50' and required seated rest break half way through due to LE pain and fatigue. Min assist required to steady and prevent LOB with as LE's flex and begin to buckle on second bout. Initiated LE exercises for pt to perform in bed and encouraged to complete 2-3x/day. Acute PT will continue to follow and progress patient as able.     Follow Up Recommendations  Home health PT (pt wants to continue with [facility] out of [facility] [place])     Equipment Recommendations  None recommended by PT    Recommendations for Other Services       Precautions / Restrictions Precautions Precautions: Fall Precaution Comments: hx falls resulting in scapula, hip and tib fracture within past 4 months Restrictions Weight Bearing Restrictions: No    Mobility  Bed Mobility Overal bed mobility: Modified Independent             General bed mobility comments: pt with HOB slightly elevated and taking extra time to move supine<>sit EOB.    Transfers Overall transfer level: Needs assistance Equipment used: 4-wheeled walker Transfers: Sit to/from Stand Sit to Stand: Min guard         General transfer comment: guarding for safety from EOB. Pt educated on safe technique for locking brakes of rollator and pushing against a wall to use as a  seat. pt demonstrated safe understanding.  Ambulation/Gait Ambulation/Gait assistance: Min assist Gait Distance (Feet): 50 Feet (2x202x25) Assistive device: 4-wheeled walker Gait Pattern/deviations: Step-through pattern;Decreased stride length;Shuffle Gait velocity: decr   General Gait Details: Cues for safe use of brakes when walking. pt with increased velocity usign 4WW compared to RW. Patient requried seated rest due to LE pain and faitgue.   Stairs             Wheelchair Mobility    Modified Rankin (Stroke Patients Only)       Balance Overall balance assessment: History of Falls;Needs assistance Sitting-balance support: Feet supported Sitting balance-Leahy Scale: Fair     Standing balance support: Bilateral upper extremity supported Standing balance-Leahy Scale: Poor                              Cognition Arousal/Alertness: Awake/alert Behavior During Therapy: WFL for tasks assessed/performed Overall Cognitive Status: Within Functional Limits for tasks assessed                                        Exercises Other Exercises Other Exercises: 5 reps supine bridge Other Exercises: 5 reps Rt clamshell and educated on performing on Lt LE as well.    General Comments        Pertinent Vitals/Pain Pain Assessment: Faces Faces Pain Scale: Hurts little more Pain Location:  LE's and feet with mobility Pain Descriptors / Indicators: Aching;Discomfort Pain Intervention(s): Limited activity within patient's tolerance;Monitored during session;Premedicated before session;Repositioned    Home Living                      Prior Function            PT Goals (current goals can now be found in the care plan section) Acute Rehab PT Goals Patient Stated Goal: get stronger PT Goal Formulation: With patient/family Time For Goal Achievement: 03/11/21 Potential to Achieve Goals: Good Progress towards PT goals: Progressing toward  goals    Frequency    Min 3X/week      PT Plan Current plan remains appropriate    Co-evaluation              AM-PAC PT "6 Clicks" Mobility   Outcome Measure  Help needed turning from your back to your side while in a flat bed without using bedrails?: None Help needed moving from lying on your back to sitting on the side of a flat bed without using bedrails?: None Help needed moving to and from a bed to a chair (including a wheelchair)?: A Little Help needed standing up from a chair using your arms (e.g., wheelchair or bedside chair)?: A Little Help needed to walk in hospital room?: A Little Help needed climbing 3-5 steps with a railing? : A Lot 6 Click Score: 19    End of Session Equipment Utilized During Treatment: Gait belt Activity Tolerance: Patient tolerated treatment well Patient left: in bed;with call bell/phone within reach;with family/visitor present Nurse Communication: Mobility status PT Visit Diagnosis: Muscle weakness (generalized) (M62.81);Difficulty in walking, not elsewhere classified (R26.2);Unsteadiness on feet (R26.81);Pain Pain - Right/Left:  (bil) Pain - part of body: Leg     Time: 1202-1226 PT Time Calculation (min) (ACUTE ONLY): 24 min  Charges:  $Gait Training: 8-22 mins $Therapeutic Exercise: 8-22 mins                     Verner Mould, DPT Acute Rehabilitation Services Office [phone] Pager [phone]     Jacques Navy 02/26/2021, 2:48 PM

## 2021-02-26 NOTE — Progress Notes (Signed)
He is still having some problems with his appetite.  He still having some nausea and abdominal cramps.  We will see about giving him Levsin on a schedule.  I am not sure why Protonix was taken off his orders.  I put her back on.  I will have to try him on a regular diet now.  Maybe he can have more choices that might be able to be more satisfying.  He is having some loose stools.  I will try him on some Imodium for this.  His potassium is still low.  His calcium is still low.  I will replace both.  He has had no bleeding.  He has had no vomiting.  The pathology report from his gastric biopsies are back.  There is no evidence of any infection or malignancy.  There is no inflammation.  His labs show a white count 3.2.  Hemoglobin 8.9.  Platelet count 120,000.  His BUN is less than 5 creatinine 0.43.  Calcium is 7.6 with an albumin of 2.3.  His vital signs are temperature 97.5.  Pulse 92.  Blood pressure 101/43.  His lungs are clear.  Oral exam shows no mucositis.  There is no adenopathy in the neck.  Cardiac exam regular rate and rhythm.  Abdomen is soft.  Bowel sounds are slightly decreased.  There is no guarding or rebound tenderness.  He has no fluid wave.  There is no palpable liver or spleen tip.  Extremities shows no clubbing, cyanosis or edema.  Neurological exam shows no focal neurological deficits.  Bruce Smith is still not ready for discharge.  We still have to work on his nutrition.  We have to get his electrolytes better.  He needs still needs his physical therapy.  I appreciate the fantastic care that he is getting from all the staff on 6 E.  Lattie Haw, MD  Hebrews 12:12

## 2021-02-26 NOTE — Progress Notes (Signed)
37.5 mcg fentanyl patch removed and discarded per this nurse and K. Domingo Cocking, RN witness.

## 2021-02-26 NOTE — Progress Notes (Signed)
OT Cancellation Note  Patient Details Name: Bruce Smith MRN: [id number] DOB: 1951-09-14   Cancelled Treatment:    Reason Eval/Treat Not Completed: Other (comment) Upon arrival patient working with PT, will re-attempt as schedule permits.  [name] OT OT pager: [phone]   [name] 02/26/2021, 2:20 PM

## 2021-02-27 DIAGNOSIS — C9000 Multiple myeloma not having achieved remission: Secondary | ICD-10-CM | POA: Diagnosis not present

## 2021-02-27 DIAGNOSIS — C419 Malignant neoplasm of bone and articular cartilage, unspecified: Secondary | ICD-10-CM | POA: Diagnosis not present

## 2021-02-27 DIAGNOSIS — E876 Hypokalemia: Secondary | ICD-10-CM | POA: Diagnosis not present

## 2021-02-27 LAB — COMPREHENSIVE METABOLIC PANEL
ALT: 10 U/L (ref 0–44)
AST: 14 U/L — ABNORMAL LOW (ref 15–41)
Albumin: 2.2 g/dL — ABNORMAL LOW (ref 3.5–5.0)
Alkaline Phosphatase: 82 U/L (ref 38–126)
Anion gap: 7 (ref 5–15)
BUN: <5 mg/dL — ABNORMAL LOW (ref 8–23)
CO2: 21 mmol/L — ABNORMAL LOW (ref 22–32)
Calcium: 8 mg/dL — ABNORMAL LOW (ref 8.9–10.3)
Chloride: 110 mmol/L (ref 98–111)
Creatinine, Ser: 0.43 mg/dL — ABNORMAL LOW (ref 0.61–1.24)
GFR, Estimated: >60 mL/min (ref >60)
Glucose, Bld: 96 mg/dL (ref 70–99)
Potassium: 3.2 mmol/L — ABNORMAL LOW (ref 3.5–5.1)
Sodium: 138 mmol/L (ref 135–145)
Total Bilirubin: 0.4 mg/dL (ref 0.3–1.2)
Total Protein: 4.1 g/dL — ABNORMAL LOW (ref 6.5–8.1)

## 2021-02-27 LAB — URINE CULTURE: Culture: >=100000 — AB

## 2021-02-27 LAB — CBC WITH DIFFERENTIAL/PLATELET
Abs Immature Granulocytes: 0.04 10*3/uL (ref 0.00–0.07)
Basophils Absolute: 0 10*3/uL (ref 0.0–0.1)
Basophils Relative: 0 %
Eosinophils Absolute: 0 10*3/uL (ref 0.0–0.5)
Eosinophils Relative: 1 %
HCT: 29 % — ABNORMAL LOW (ref 39.0–52.0)
Hemoglobin: 9.5 g/dL — ABNORMAL LOW (ref 13.0–17.0)
Immature Granulocytes: 1 %
Lymphocytes Relative: 15 %
Lymphs Abs: 0.9 10*3/uL (ref 0.7–4.0)
MCH: 31.5 pg (ref 26.0–34.0)
MCHC: 32.8 g/dL (ref 30.0–36.0)
MCV: 96 fL (ref 80.0–100.0)
Monocytes Absolute: 0.8 10*3/uL (ref 0.1–1.0)
Monocytes Relative: 12 %
Neutro Abs: 4.6 10*3/uL (ref 1.7–7.7)
Neutrophils Relative %: 71 %
Platelets: 131 10*3/uL — ABNORMAL LOW (ref 150–400)
RBC: 3.02 MIL/uL — ABNORMAL LOW (ref 4.22–5.81)
RDW: 15.5 % (ref 11.5–15.5)
WBC: 6.4 10*3/uL (ref 4.0–10.5)
nRBC: 0 % (ref 0.0–0.2)

## 2021-02-27 LAB — MAGNESIUM: Magnesium: 1.4 mg/dL — ABNORMAL LOW (ref 1.7–2.4)

## 2021-02-27 MED ORDER — CALCIUM GLUCONATE-NACL 2-0.675 GM/100ML-% IV SOLN
2.0000 g | INTRAVENOUS | Status: AC
  Administered 2021-02-27 (×2): 2000 mg via INTRAVENOUS
  Filled 2021-02-27 (×2): qty 100

## 2021-02-27 MED ORDER — POTASSIUM CHLORIDE 10 MEQ/50ML IV SOLN
10.0000 meq | INTRAVENOUS | Status: AC
  Administered 2021-02-27 (×5): 10 meq via INTRAVENOUS
  Filled 2021-02-27 (×5): qty 50

## 2021-02-27 MED ORDER — MAGNESIUM SULFATE 2 GM/50ML IV SOLN
2.0000 g | Freq: Once | INTRAVENOUS | Status: AC
  Administered 2021-02-27: 2 g via INTRAVENOUS
  Filled 2021-02-27: qty 50

## 2021-02-27 NOTE — Progress Notes (Signed)
  Overall, Bruce Smith is making slow but steady improvement.  He seems to be eating more.  His nausea seems to be less.  He does not have as many abdominal cramps.  Maybe, the Levsin is helping with this.  He still has low potassium.  Potassium 3.2.  We will give him some more IV potassium.  His magnesium is 1.4.  I will give another dose of IV magnesium.  His calcium is coming up slowly.  Calcium is 8.  I still would like to give him another dose of IV calcium.  His white cell count 6.4.  Hemoglobin 9.5.  Platelet count 131,000.  His albumin is still low at 2.2.  We still have to try to work on this.  He is not having as much pain.  He is doing better with physical therapy and Occupational Therapy.  I very much appreciate their efforts in trying to improve his functional state.  He has had no fever.  He has had no cough.  His vital signs show temperature 98.2.  Pulse 92.  Blood pressure 130/70.  Overall, there is really no change in his physical exam.  I am just glad that [name] is making steady progress.  He still has not ready for discharge as I still have to worry about his nutritional intake.  If we can get his albumin rising, then I think would feel better with him being able to go home.  I appreciate the outstanding care that the staff on 6 E. are giving him.  They really are showing a lot of compassion and empathy for he and his wife.  Lattie Haw, MD  Michaelyn Barter 6:9

## 2021-02-28 DIAGNOSIS — E876 Hypokalemia: Secondary | ICD-10-CM | POA: Diagnosis not present

## 2021-02-28 DIAGNOSIS — E46 Unspecified protein-calorie malnutrition: Secondary | ICD-10-CM

## 2021-02-28 DIAGNOSIS — R197 Diarrhea, unspecified: Secondary | ICD-10-CM

## 2021-02-28 DIAGNOSIS — N390 Urinary tract infection, site not specified: Secondary | ICD-10-CM

## 2021-02-28 DIAGNOSIS — C9000 Multiple myeloma not having achieved remission: Secondary | ICD-10-CM | POA: Diagnosis not present

## 2021-02-28 DIAGNOSIS — C419 Malignant neoplasm of bone and articular cartilage, unspecified: Secondary | ICD-10-CM | POA: Diagnosis not present

## 2021-02-28 LAB — COMPREHENSIVE METABOLIC PANEL
ALT: 10 U/L (ref 0–44)
AST: 12 U/L — ABNORMAL LOW (ref 15–41)
Albumin: 2.2 g/dL — ABNORMAL LOW (ref 3.5–5.0)
Alkaline Phosphatase: 80 U/L (ref 38–126)
Anion gap: 4 — ABNORMAL LOW (ref 5–15)
BUN: <5 mg/dL — ABNORMAL LOW (ref 8–23)
CO2: 24 mmol/L (ref 22–32)
Calcium: 7.9 mg/dL — ABNORMAL LOW (ref 8.9–10.3)
Chloride: 110 mmol/L (ref 98–111)
Creatinine, Ser: <0.3 mg/dL — ABNORMAL LOW (ref 0.61–1.24)
Glucose, Bld: 97 mg/dL (ref 70–99)
Potassium: 3.6 mmol/L (ref 3.5–5.1)
Sodium: 138 mmol/L (ref 135–145)
Total Bilirubin: 0.4 mg/dL (ref 0.3–1.2)
Total Protein: 4.1 g/dL — ABNORMAL LOW (ref 6.5–8.1)

## 2021-02-28 LAB — CBC WITH DIFFERENTIAL/PLATELET
Abs Immature Granulocytes: 0.04 10*3/uL (ref 0.00–0.07)
Basophils Absolute: 0 10*3/uL (ref 0.0–0.1)
Basophils Relative: 0 %
Eosinophils Absolute: 0 10*3/uL (ref 0.0–0.5)
Eosinophils Relative: 1 %
HCT: 26.8 % — ABNORMAL LOW (ref 39.0–52.0)
Hemoglobin: 8.7 g/dL — ABNORMAL LOW (ref 13.0–17.0)
Immature Granulocytes: 1 %
Lymphocytes Relative: 13 %
Lymphs Abs: 0.9 10*3/uL (ref 0.7–4.0)
MCH: 31 pg (ref 26.0–34.0)
MCHC: 32.5 g/dL (ref 30.0–36.0)
MCV: 95.4 fL (ref 80.0–100.0)
Monocytes Absolute: 0.9 10*3/uL (ref 0.1–1.0)
Monocytes Relative: 13 %
Neutro Abs: 4.8 10*3/uL (ref 1.7–7.7)
Neutrophils Relative %: 72 %
Platelets: 121 10*3/uL — ABNORMAL LOW (ref 150–400)
RBC: 2.81 MIL/uL — ABNORMAL LOW (ref 4.22–5.81)
RDW: 15.4 % (ref 11.5–15.5)
WBC: 6.6 10*3/uL (ref 4.0–10.5)
nRBC: 0 % (ref 0.0–0.2)

## 2021-02-28 LAB — MAGNESIUM: Magnesium: 1.6 mg/dL — ABNORMAL LOW (ref 1.7–2.4)

## 2021-02-28 MED ORDER — SODIUM CHLORIDE 0.9 % IV SOLN
8.0000 mg | Freq: Two times a day (BID) | INTRAVENOUS | Status: DC
  Administered 2021-02-28: 8 mg via INTRAVENOUS
  Filled 2021-02-28 (×3): qty 4

## 2021-02-28 MED ORDER — SODIUM CHLORIDE 0.9 % IV SOLN
1.0000 g | Freq: Four times a day (QID) | INTRAVENOUS | Status: DC
  Administered 2021-02-28 – 2021-03-02 (×8): 1 g via INTRAVENOUS
  Filled 2021-02-28 (×4): qty 1
  Filled 2021-02-28: qty 1000
  Filled 2021-02-28 (×4): qty 1

## 2021-02-28 MED ORDER — SODIUM CHLORIDE 0.9 % IV SOLN
4.0000 g | INTRAVENOUS | Status: AC
  Administered 2021-02-28 (×2): 4 g via INTRAVENOUS
  Filled 2021-02-28 (×2): qty 40

## 2021-02-28 NOTE — Progress Notes (Signed)
Looks like we now have a Enterococcus urinary tract infection.  I will put him on some Unasyn for this.  This may help some of the nausea and abdominal cramps that he has.  He is slowly eating better.  His albumin is still quite low at 2.2.  He is not having problems with diarrhea.  His calcium is still on the lower side.  I will give him another dose of IV calcium.  Is a magnesium is 1.6.  This is a little bit on the lower side but we will hold off on supplementing this.  His hemoglobin dropped to 8.7.  We will have to watch this.  He is not have any fever.  There are no mouth sores.  There is no cough.  He is out of bed a little bit.  All of his vital signs look pretty stable.  There really is no change in his physical exam.  Bowel sounds might be a little bit more active.  We will have to see how treating this Enterococcus in the urine helps.  Hopefully, this will improve his oral intake.  Hopefully will help with nausea.  I appreciate the great care that he is getting from all the staff up on 6 E.  Lattie Haw, MD  Psalm 41:1

## 2021-02-28 NOTE — Progress Notes (Signed)
OT Cancellation Note  Patient Details Name: Famous Eisenhardt MRN: [id number] DOB: 04-24-1951   Cancelled Treatment:    Reason Eval/Treat Not Completed: Pain limiting ability to participate. Pt with 8/10 FACES pain in BLE. Nursing notified of pt's request for pain med. Pt declined offer for OT to re-attempt in a bit once premedicated; reports he has been up some today. Plan to re-attempt at a later date.  Tyrone Schimke, OT Acute Rehabilitation Services Pager: [phone] Office: [phone]  02/28/2021, 2:19 PM

## 2021-02-28 NOTE — Progress Notes (Signed)
Pharmacy Antibiotic Note  Bruce Smith is a 70 y.o. male admitted on 02/23/2021 with UTI.  Pharmacy was originally consulted for Unasyn for Enteroccoal UTI, in stem cell transplant patient for MM. Enterococcus sens to Ampicillin, sent request to MD to de-escalate using Ampicillin  Plan: Ampicillin 1g q6hr     Temp (24hrs), Avg:98.8 F (37.1 C), Min:97.8 F (36.6 C), Max:99.5 F (37.5 C)  Recent Labs  Lab 02/24/21 0551 02/25/21 0511 02/26/21 0529 02/27/21 0557 02/28/21 0619  WBC 3.4* 6.0 3.2* 6.4 6.6  CREATININE 0.35* 0.51* 0.43* 0.43* <0.30*    CrCl cannot be calculated (This lab value cannot be used to calculate CrCl because it is not a number: <0.30).    No Known Allergies  Antimicrobials this admission: 5/1 Ampicillin >>    Dose adjustments this admission:  Microbiology results: 4/26 UCx: Enterococcus, sent to Amp, NTF, Vancomycin  4/26 MRSA PCR: neg  Thank you for allowing pharmacy to be a part of this patient's care.  Minda Ditto 02/28/2021 8:25 AM

## 2021-03-01 DIAGNOSIS — E876 Hypokalemia: Secondary | ICD-10-CM | POA: Diagnosis not present

## 2021-03-01 DIAGNOSIS — R52 Pain, unspecified: Secondary | ICD-10-CM

## 2021-03-01 DIAGNOSIS — C419 Malignant neoplasm of bone and articular cartilage, unspecified: Secondary | ICD-10-CM | POA: Diagnosis not present

## 2021-03-01 DIAGNOSIS — G893 Neoplasm related pain (acute) (chronic): Secondary | ICD-10-CM | POA: Diagnosis not present

## 2021-03-01 DIAGNOSIS — C9000 Multiple myeloma not having achieved remission: Secondary | ICD-10-CM | POA: Diagnosis not present

## 2021-03-01 DIAGNOSIS — Z515 Encounter for palliative care: Secondary | ICD-10-CM | POA: Diagnosis not present

## 2021-03-01 DIAGNOSIS — N390 Urinary tract infection, site not specified: Secondary | ICD-10-CM | POA: Diagnosis not present

## 2021-03-01 LAB — CBC WITH DIFFERENTIAL/PLATELET
Abs Immature Granulocytes: 0.03 10*3/uL (ref 0.00–0.07)
Basophils Absolute: 0 10*3/uL (ref 0.0–0.1)
Basophils Relative: 0 %
Eosinophils Absolute: 0 10*3/uL (ref 0.0–0.5)
Eosinophils Relative: 0 %
HCT: 26 % — ABNORMAL LOW (ref 39.0–52.0)
Hemoglobin: 8.4 g/dL — ABNORMAL LOW (ref 13.0–17.0)
Immature Granulocytes: 0 %
Lymphocytes Relative: 12 %
Lymphs Abs: 0.9 10*3/uL (ref 0.7–4.0)
MCH: 30.9 pg (ref 26.0–34.0)
MCHC: 32.3 g/dL (ref 30.0–36.0)
MCV: 95.6 fL (ref 80.0–100.0)
Monocytes Absolute: 1 10*3/uL (ref 0.1–1.0)
Monocytes Relative: 13 %
Neutro Abs: 5.5 10*3/uL (ref 1.7–7.7)
Neutrophils Relative %: 75 %
Platelets: 122 10*3/uL — ABNORMAL LOW (ref 150–400)
RBC: 2.72 MIL/uL — ABNORMAL LOW (ref 4.22–5.81)
RDW: 15.4 % (ref 11.5–15.5)
WBC: 7.5 10*3/uL (ref 4.0–10.5)
nRBC: 0 % (ref 0.0–0.2)

## 2021-03-01 LAB — COMPREHENSIVE METABOLIC PANEL
ALT: 10 U/L (ref 0–44)
AST: 13 U/L — ABNORMAL LOW (ref 15–41)
Albumin: 2.2 g/dL — ABNORMAL LOW (ref 3.5–5.0)
Alkaline Phosphatase: 71 U/L (ref 38–126)
Anion gap: 6 (ref 5–15)
BUN: <5 mg/dL — ABNORMAL LOW (ref 8–23)
CO2: 25 mmol/L (ref 22–32)
Calcium: 8 mg/dL — ABNORMAL LOW (ref 8.9–10.3)
Chloride: 107 mmol/L (ref 98–111)
Creatinine, Ser: 0.36 mg/dL — ABNORMAL LOW (ref 0.61–1.24)
GFR, Estimated: >60 mL/min (ref >60)
Glucose, Bld: 98 mg/dL (ref 70–99)
Potassium: 3.5 mmol/L (ref 3.5–5.1)
Sodium: 138 mmol/L (ref 135–145)
Total Bilirubin: 0.3 mg/dL (ref 0.3–1.2)
Total Protein: 3.8 g/dL — ABNORMAL LOW (ref 6.5–8.1)

## 2021-03-01 LAB — PREPARE RBC (CROSSMATCH)

## 2021-03-01 LAB — MAGNESIUM: Magnesium: 1.4 mg/dL — ABNORMAL LOW (ref 1.7–2.4)

## 2021-03-01 MED ORDER — FUROSEMIDE 10 MG/ML IJ SOLN
20.0000 mg | Freq: Once | INTRAMUSCULAR | Status: AC
Start: 2021-03-01 — End: 2021-03-01
  Administered 2021-03-01: 20 mg via INTRAVENOUS
  Filled 2021-03-01: qty 2

## 2021-03-01 MED ORDER — SODIUM CHLORIDE 0.9 % IV SOLN
8.0000 mg | Freq: Three times a day (TID) | INTRAVENOUS | Status: DC | PRN
  Administered 2021-03-03 – 2021-03-07 (×3): 8 mg via INTRAVENOUS
  Filled 2021-03-01: qty 8
  Filled 2021-03-01 (×3): qty 4

## 2021-03-01 MED ORDER — SODIUM CHLORIDE 0.9% IV SOLUTION: Freq: Once | INTRAVENOUS | Status: AC

## 2021-03-01 MED ORDER — MAGNESIUM SULFATE 2 GM/50ML IV SOLN
2.0000 g | Freq: Once | INTRAVENOUS | Status: AC
Start: 2021-03-01 — End: 2021-03-01
  Administered 2021-03-01: 2 g via INTRAVENOUS
  Filled 2021-03-01: qty 50

## 2021-03-01 NOTE — Progress Notes (Signed)
Palliative care progress note  Reason for visit: Cancer related pain  I saw and examined [name] today.  His wife and sister were at the bedside.  Family reports that he has gotten more confused throughout the day today.  His wife also reports that his pain has been worsened over the past couple of days as well.    Dr. Rowe Pavy from our team evaluated Bruce Smith on 4/27, and at that point, he had reported that his pain was well controlled.  I reviewed his MAR today, and he had been averaging 3 rescue doses of dilaudid per 24 hour period until yesterday, when this need doubled to 6 rescue dosed.  He has had 4 rescue doses today.  In discussion with him, he reports that his pain is the same pain that he has had from some time and has not changed in character but it is more frequent and intense.  His wife reports that he has been having increased pain when touched, but it does not appear to be allodynia as she gently rubs his legs without eliciting pain.  They also report increased weakness in addition to the confusion.  We discussed that there may be one of several things driving this change in his pain: 1) Pain seemed to worsen when he was due for fentanyl patch change.  He may be someone who needs patch changed every 48 hours.  If his pain is improved tomorrow (after patch change today) this may be the case. 2) He may simply need more medication.  If confusion improves and he continues to require more frequent breakthrough medication, he may need his fentanyl patch increased. 3) He may have some toxicity to dilaudid.  I don't think this is the case at this point, however, if he remains confused (after continued time on abx) or pain continues to worsen, we may need to consider opioid rotation. 4) Something else is going on from changes in underlying pathology that has caused pain to worsen.   Again, family stressed that he is more confused this afternoon than he was this morning.  We discussed concern  for component of delirium and the multifactorial nature of this.  Discussed that UTI is likely the driving factor, but we also discussed potential for other contributors as well including pain medication (with increased use over the past 36 hours) or constipation (last BM was 3 days ago).  My recommendation was to see how he is doing tomorrow, and if confusion persists, we may need to consider rotation of opioids to ensure this is not the driving factor.     - Plan to reassess and coordinate with Dr. [name] based upon his clinical course overnight and tomorrow morning.  Family trusts Dr. [name] greatly and I assured them that I was simply going to take a look at things in conjunction with him, but he will continue to drive all decision making regarding medication changes.  Total time: 45 minutes  Greater than 50%  of this time was spent counseling and coordinating care related to the above assessment and plan.  Bruce Rough, MD [facility] Team [phone]

## 2021-03-01 NOTE — Progress Notes (Signed)
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 Pt recently had ASCT at [facility] on 01/22/2021.  Patient admitted due to weakness and inability to stand at doctor's office. Patient also with persistent nausea and vomiting with eating. S/p EGD 4/27    PT Comments    Patient very limited today by LE pain and required increased Mod Assist for rise from recliner. Pt performed 3x from recliner and on final stand chair height increased with folded blankets. Pt had increased posterior lean on standing and required assist to facilitate anterior weight shift to improve posture. Pt educated on how to open link for online HEP via [employer]. He will continue to benefit from skilled PT interventions to progress mobility as able.   Follow Up Recommendations  Home health PT (pt wants to continue with [facility])     Equipment Recommendations  None recommended by PT    Recommendations for Other Services       Precautions / Restrictions Precautions Precautions: Fall Precaution Comments: hx falls resulting in scapula, hip and tib fracture within past 4 months Restrictions Weight Bearing Restrictions: No    Mobility  Bed Mobility               General bed mobility comments: pt OOB in recliner    Transfers Overall transfer level: Needs assistance Equipment used: 4-wheeled walker Transfers: Sit to/from Stand Sit to Stand: Mod assist         General transfer comment: Mod Assist for power up with cues for bil UE use on armrests to initiate. pt with significant posterior lean against recliner and manual assist required to facilitate anterior weight shift. pt performed 3x from recliner. Following 3rd rise, blankets  placed in chair to increase height for greater ease with rise.  Ambulation/Gait                 Stairs             Wheelchair Mobility    Modified Rankin (Stroke Patients Only)       Balance Overall balance assessment: History of Falls;Needs assistance Sitting-balance support: Feet supported Sitting balance-Leahy Scale: Fair     Standing balance support: Bilateral upper extremity supported Standing balance-Leahy Scale: Poor Standing balance comment: reliant on walker and external support from therapist                            Cognition Arousal/Alertness: Awake/alert Behavior During Therapy: WFL for tasks assessed/performed Overall Cognitive Status: Within Functional Limits for tasks assessed                                        Exercises Other Exercises Other Exercises: HEP sent via [employer] link to pt's phone, pt demonstrated ability to access exercises with cues for following link.    General Comments        Pertinent Vitals/Pain Pain Assessment: Faces Faces Pain Scale: Hurts whole lot Pain Location: LE's and feet with mobility Pain Descriptors / Indicators: Grimacing Pain Intervention(s): Limited activity within patient's tolerance;Monitored during session;Patient requesting pain meds-RN notified;RN gave pain meds during session    Home  Living                      Prior Function            PT Goals (current goals can now be found in the care plan section) Acute Rehab PT Goals Patient Stated Goal: legs to stop hurting PT Goal Formulation: With patient/family Time For Goal Achievement: 03/11/21 Potential to Achieve Goals: Good Progress towards PT goals: Progressing toward goals (limited today by pain)    Frequency    Min 3X/week      PT Plan Current plan remains appropriate    Co-evaluation              AM-PAC PT "6 Clicks" Mobility   Outcome Measure  Help needed turning from your  back to your side while in a flat bed without using bedrails?: None Help needed moving from lying on your back to sitting on the side of a flat bed without using bedrails?: None Help needed moving to and from a bed to a chair (including a wheelchair)?: A Lot Help needed standing up from a chair using your arms (e.g., wheelchair or bedside chair)?: A Lot Help needed to walk in hospital room?: A Lot Help needed climbing 3-5 steps with a railing? : Total 6 Click Score: 15    End of Session Equipment Utilized During Treatment: Gait belt Activity Tolerance: Patient limited by pain Patient left: in bed;with call bell/phone within reach;with family/visitor present Nurse Communication: Mobility status PT Visit Diagnosis: Muscle weakness (generalized) (M62.81);Difficulty in walking, not elsewhere classified (R26.2);Unsteadiness on feet (R26.81);Pain Pain - Right/Left:  (bil) Pain - part of body: Leg     Time: 0737-1062 PT Time Calculation (min) (ACUTE ONLY): 23 min  Charges:  $Therapeutic Activity: 8-22 mins                     Verner Mould, DPT Acute Rehabilitation Services Office [phone] Pager [phone]     Jacques Navy 03/01/2021, 12:52 PM

## 2021-03-01 NOTE — Progress Notes (Signed)
Bruce Smith is doing okay this morning although he does feel a little weak.  His hemoglobin is dropping.  I suspect this probably is from his blood draws and just being in the hospital.  I really think he is going to need to be transfused with 1 unit of packed red blood cells.  I talked him about this.  I explained why I thought this would be helpful.  He agrees.  He is on ampicillin for the Enterococcus urinary tract infection.  He has had no fever.  He is eating better.  I ran this morning brought in some food from [employer].  This I think is a good sign.  We will have to see what his physical therapy and Occupational Therapy does today.  He has had no nausea.  He has had no diarrhea.  Is magnesium is 1.4.  We will give him some magnesium today.  His calcium is 8.  His albumin is still 2.2.  His potassium is 3.5.  The vital signs show temperature 99.2.  Pulse 96.  Blood pressure 135/77.  Oral exam shows no mucositis.  Lungs are clear bilaterally.  He has good air movement bilaterally.  Cardiac exam regular rate and rhythm.  Abdomen is soft.  Bowel sounds are present.  He has no guarding or rebound tenderness.  Extremities shows no clubbing, cyanosis or edema.  Neurological exam is nonfocal.  Again, we will transfuse 1 unit of blood today.  I think this will help him out.  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  He will continue the IV ampicillin for right now.  Hopefully, he will be able to go home in a couple days.  This was certainly be reasonable.  We does have to get him stronger.  Lattie Haw, MD  Psalms 30:2

## 2021-03-02 ENCOUNTER — Inpatient Hospital Stay: Payer: BC Managed Care – PPO

## 2021-03-02 DIAGNOSIS — R52 Pain, unspecified: Secondary | ICD-10-CM | POA: Diagnosis not present

## 2021-03-02 DIAGNOSIS — R410 Disorientation, unspecified: Secondary | ICD-10-CM | POA: Diagnosis not present

## 2021-03-02 DIAGNOSIS — Z515 Encounter for palliative care: Secondary | ICD-10-CM | POA: Diagnosis not present

## 2021-03-02 DIAGNOSIS — C9000 Multiple myeloma not having achieved remission: Secondary | ICD-10-CM | POA: Diagnosis not present

## 2021-03-02 DIAGNOSIS — G893 Neoplasm related pain (acute) (chronic): Secondary | ICD-10-CM | POA: Diagnosis not present

## 2021-03-02 DIAGNOSIS — C419 Malignant neoplasm of bone and articular cartilage, unspecified: Secondary | ICD-10-CM | POA: Diagnosis not present

## 2021-03-02 DIAGNOSIS — E876 Hypokalemia: Secondary | ICD-10-CM | POA: Diagnosis not present

## 2021-03-02 LAB — CBC WITH DIFFERENTIAL/PLATELET
Abs Immature Granulocytes: 0.05 10*3/uL (ref 0.00–0.07)
Basophils Absolute: 0 10*3/uL (ref 0.0–0.1)
Basophils Relative: 0 %
Eosinophils Absolute: 0.1 10*3/uL (ref 0.0–0.5)
Eosinophils Relative: 1 %
HCT: 28.6 % — ABNORMAL LOW (ref 39.0–52.0)
Hemoglobin: 9.8 g/dL — ABNORMAL LOW (ref 13.0–17.0)
Immature Granulocytes: 1 %
Lymphocytes Relative: 7 %
Lymphs Abs: 0.6 10*3/uL — ABNORMAL LOW (ref 0.7–4.0)
MCH: 31.9 pg (ref 26.0–34.0)
MCHC: 34.3 g/dL (ref 30.0–36.0)
MCV: 93.2 fL (ref 80.0–100.0)
Monocytes Absolute: 1 10*3/uL (ref 0.1–1.0)
Monocytes Relative: 12 %
Neutro Abs: 6.4 10*3/uL (ref 1.7–7.7)
Neutrophils Relative %: 79 %
Platelets: 117 10*3/uL — ABNORMAL LOW (ref 150–400)
RBC: 3.07 MIL/uL — ABNORMAL LOW (ref 4.22–5.81)
RDW: 15.3 % (ref 11.5–15.5)
WBC: 8.1 10*3/uL (ref 4.0–10.5)
nRBC: 0 % (ref 0.0–0.2)

## 2021-03-02 LAB — COMPREHENSIVE METABOLIC PANEL
ALT: 8 U/L (ref 0–44)
AST: 14 U/L — ABNORMAL LOW (ref 15–41)
Albumin: 2.1 g/dL — ABNORMAL LOW (ref 3.5–5.0)
Alkaline Phosphatase: 69 U/L (ref 38–126)
Anion gap: 7 (ref 5–15)
BUN: <5 mg/dL — ABNORMAL LOW (ref 8–23)
CO2: 26 mmol/L (ref 22–32)
Calcium: 7.4 mg/dL — ABNORMAL LOW (ref 8.9–10.3)
Chloride: 106 mmol/L (ref 98–111)
Creatinine, Ser: 0.36 mg/dL — ABNORMAL LOW (ref 0.61–1.24)
GFR, Estimated: >60 mL/min (ref >60)
Glucose, Bld: 95 mg/dL (ref 70–99)
Potassium: 3.1 mmol/L — ABNORMAL LOW (ref 3.5–5.1)
Sodium: 139 mmol/L (ref 135–145)
Total Bilirubin: 0.7 mg/dL (ref 0.3–1.2)
Total Protein: 3.9 g/dL — ABNORMAL LOW (ref 6.5–8.1)

## 2021-03-02 LAB — TYPE AND SCREEN
ABO/RH(D): O POS
Antibody Screen: POSITIVE
Unit division: 0

## 2021-03-02 LAB — BPAM RBC
Blood Product Expiration Date: 202205282359
ISSUE DATE / TIME: 202205021430
Unit Type and Rh: 5100

## 2021-03-02 LAB — MAGNESIUM: Magnesium: 1.6 mg/dL — ABNORMAL LOW (ref 1.7–2.4)

## 2021-03-02 MED ORDER — HYDROMORPHONE HCL 2 MG/ML IJ SOLN
1.5000 mg | INTRAMUSCULAR | Status: DC | PRN
  Administered 2021-03-02 – 2021-03-03 (×3): 1.5 mg via INTRAVENOUS
  Filled 2021-03-02 (×3): qty 1

## 2021-03-02 MED ORDER — FENTANYL 50 MCG/HR TD PT72
1.0000 | MEDICATED_PATCH | TRANSDERMAL | Status: DC
  Administered 2021-03-02: 1 via TRANSDERMAL
  Filled 2021-03-02: qty 1

## 2021-03-02 MED ORDER — PREGABALIN 50 MG PO CAPS
100.0000 mg | ORAL_CAPSULE | Freq: Every day | ORAL | Status: DC
  Administered 2021-03-03 – 2021-03-10 (×8): 100 mg via ORAL
  Filled 2021-03-02 (×8): qty 2

## 2021-03-02 MED ORDER — PREGABALIN 75 MG PO CAPS
150.0000 mg | ORAL_CAPSULE | Freq: Every day | ORAL | Status: DC
  Administered 2021-03-02 – 2021-03-09 (×8): 150 mg via ORAL
  Filled 2021-03-02 (×8): qty 2

## 2021-03-02 NOTE — Progress Notes (Signed)
Apparently, Ms. Mineau had a tough day yesterday.  He had some confusion.  He really has not had any problems with confusion since he has been in the hospital.  Really have not had any obvious change in medications.  I very much appreciate Dr. Kirstie Mirza help with respect to pain management.  We will make adjustments according to Mr. Sees mental state.  When I saw him yesterday morning, he was alert and oriented.  He had a good breakfast.  I am not sure exactly what the change was.  He did get 1 unit of blood given as hemoglobin was 8.4.  I cannot imagine that would have caused issues.  There are no labs yet back.  I will go ahead and stop the ampicillin.  I think this will be enough for his Enterococcus UTI.  I will recheck a urinalysis on him.  He has had no cough.  Is been no shortness of breath.  The pain seems to be in his legs.  Again I am not sure as to why he would have a flareup of pain in the legs.  All hospital admission, he was doing well.  We may have to think about getting him to a rehab unit if we cannot make an further improvement in his performance status.  I would hate to have to do this.  I still think his wife can take care of him at home.  There is been no diarrhea.  His vital signs all look good.  Temperature 99.  Pulse 86.  Blood pressure 125/66.  His lungs are clear.  Cardiac exam regular rate and rhythm.  Oral exam shows no mucositis.  Abdomen is soft.  There is no fluid wave.  There is no liver or spleen tip.  His extremities shows no swelling in the legs.  Neurological exam shows no obvious neurological deficits.  We will have to see how the pain situation goes today.  We will have to see how his mental state changes if any.  He was making really good progress.  He was eating.  He is not having nausea.  I know that the staff on [address] are doing a fantastic job with him.  I appreciate their care and they are compassion.  They are always on top of any changes with  our patients.  Lattie Haw, MD  Psalms 44:8

## 2021-03-02 NOTE — Progress Notes (Signed)
Palliative care progress note  Reason for visit: Cancer related pain  I saw and examined Bruce Smith today.  He was certainly much more awake and interactive today than he was when I saw him yesterday.  His sister at the bedside reports he has been having some mild confusion, but he is much improved from yesterday.  He certainly is more interactive and carries on conversation without difficulty today.  His sister tells me that he still complains of pain in his legs he is described it is shooting down at times.  We reviewed prior medications including prior adjuvant medications.  She tells me the last admission he was at [facility] he had Lyrica 100 mg during the day and 150 mg at night.  I personally reviewed notes from [facility] through care everywhere and she is correct with dosing last admission being 100 mg during the day and 150 mg at night.  We talked about a plan to increase nighttime dosage of Lyrica to see if this is helpful.  I reviewed his overall opioid usage and he had 5 doses of 2 mg of IV Dilaudid in the last 24 hours.  As he is more awake and alert today and continues to require around-the-clock rescue dosing, I recommended consideration for increasing his fentanyl patch.  After discussion with Dr. [name], will increase to 50 mcg/h.  While this is not equivalent to the rescue dose of medication he has been using, I am hopeful with combination of increasing fentanyl patch and Lyrica that he is able to be better controlled with his pain.  He reports having a bowel movement last night or this morning.  His sister indicates she is not sure if this is the case.  We discussed importance of continue with bowel regimen.  - Plan to continue with daytime Lyrica dose of 100 mg and increase nighttime dose to 150 mg.  The pain in his legs has neuropathic component and he was previously on this regimen during his last admission at [facility]. - Increase fentanyl patch to 50 mcg/h - Begin to work on weaning his  short acting medication with hopes we can transition to oral rescue medication after increased dose of fentanyl patch at steady state. - Coordinated the above with Dr. [name]  Total time: 40 minutes  Greater than 50%  of this time was spent counseling and coordinating care related to the above assessment and plan.  Micheline Rough, MD [facility] [phone]

## 2021-03-03 DIAGNOSIS — G893 Neoplasm related pain (acute) (chronic): Secondary | ICD-10-CM | POA: Diagnosis not present

## 2021-03-03 DIAGNOSIS — R410 Disorientation, unspecified: Secondary | ICD-10-CM | POA: Diagnosis not present

## 2021-03-03 DIAGNOSIS — Z515 Encounter for palliative care: Secondary | ICD-10-CM | POA: Diagnosis not present

## 2021-03-03 DIAGNOSIS — C419 Malignant neoplasm of bone and articular cartilage, unspecified: Secondary | ICD-10-CM | POA: Diagnosis not present

## 2021-03-03 DIAGNOSIS — R52 Pain, unspecified: Secondary | ICD-10-CM | POA: Diagnosis not present

## 2021-03-03 DIAGNOSIS — E876 Hypokalemia: Secondary | ICD-10-CM | POA: Diagnosis not present

## 2021-03-03 DIAGNOSIS — C9000 Multiple myeloma not having achieved remission: Secondary | ICD-10-CM | POA: Diagnosis not present

## 2021-03-03 LAB — CBC WITH DIFFERENTIAL/PLATELET
Abs Immature Granulocytes: 0.03 10*3/uL (ref 0.00–0.07)
Basophils Absolute: 0 10*3/uL (ref 0.0–0.1)
Basophils Relative: 0 %
Eosinophils Absolute: 0 10*3/uL (ref 0.0–0.5)
Eosinophils Relative: 1 %
HCT: 29.3 % — ABNORMAL LOW (ref 39.0–52.0)
Hemoglobin: 9.9 g/dL — ABNORMAL LOW (ref 13.0–17.0)
Immature Granulocytes: 1 %
Lymphocytes Relative: 11 %
Lymphs Abs: 0.7 10*3/uL (ref 0.7–4.0)
MCH: 31.5 pg (ref 26.0–34.0)
MCHC: 33.8 g/dL (ref 30.0–36.0)
MCV: 93.3 fL (ref 80.0–100.0)
Monocytes Absolute: 0.8 10*3/uL (ref 0.1–1.0)
Monocytes Relative: 11 %
Neutro Abs: 5.1 10*3/uL (ref 1.7–7.7)
Neutrophils Relative %: 76 %
Platelets: 132 10*3/uL — ABNORMAL LOW (ref 150–400)
RBC: 3.14 MIL/uL — ABNORMAL LOW (ref 4.22–5.81)
RDW: 15.1 % (ref 11.5–15.5)
WBC: 6.6 10*3/uL (ref 4.0–10.5)
nRBC: 0 % (ref 0.0–0.2)

## 2021-03-03 LAB — COMPREHENSIVE METABOLIC PANEL
ALT: 9 U/L (ref 0–44)
AST: 12 U/L — ABNORMAL LOW (ref 15–41)
Albumin: 2.1 g/dL — ABNORMAL LOW (ref 3.5–5.0)
Alkaline Phosphatase: 69 U/L (ref 38–126)
Anion gap: 6 (ref 5–15)
BUN: <5 mg/dL — ABNORMAL LOW (ref 8–23)
CO2: 22 mmol/L (ref 22–32)
Calcium: 7.1 mg/dL — ABNORMAL LOW (ref 8.9–10.3)
Chloride: 105 mmol/L (ref 98–111)
Creatinine, Ser: 0.36 mg/dL — ABNORMAL LOW (ref 0.61–1.24)
GFR, Estimated: >60 mL/min (ref >60)
Glucose, Bld: 95 mg/dL (ref 70–99)
Potassium: 3.1 mmol/L — ABNORMAL LOW (ref 3.5–5.1)
Sodium: 133 mmol/L — ABNORMAL LOW (ref 135–145)
Total Bilirubin: 0.6 mg/dL (ref 0.3–1.2)
Total Protein: 4.1 g/dL — ABNORMAL LOW (ref 6.5–8.1)

## 2021-03-03 LAB — VITAMIN D 25 HYDROXY (VIT D DEFICIENCY, FRACTURES): Vit D, 25-Hydroxy: 31.31 ng/mL (ref 30–100)

## 2021-03-03 LAB — MAGNESIUM: Magnesium: 1.6 mg/dL — ABNORMAL LOW (ref 1.7–2.4)

## 2021-03-03 MED ORDER — MEGESTROL ACETATE 400 MG/10ML PO SUSP
400.0000 mg | Freq: Two times a day (BID) | ORAL | Status: DC
  Administered 2021-03-03 (×2): 400 mg via ORAL
  Filled 2021-03-03 (×2): qty 10

## 2021-03-03 MED ORDER — RESOURCE INSTANT PROTEIN PO PWD PACKET
1.0000 | Freq: Three times a day (TID) | ORAL | Status: DC
  Administered 2021-03-03: 6 g via ORAL
  Filled 2021-03-03 (×3): qty 6

## 2021-03-03 MED ORDER — POTASSIUM CHLORIDE 10 MEQ/50ML IV SOLN
10.0000 meq | INTRAVENOUS | Status: AC
  Administered 2021-03-03 (×6): 10 meq via INTRAVENOUS
  Filled 2021-03-03 (×6): qty 50

## 2021-03-03 MED ORDER — SODIUM CHLORIDE 0.9 % IV SOLN
4.0000 g | Freq: Once | INTRAVENOUS | Status: AC
Start: 2021-03-03 — End: 2021-03-03
  Administered 2021-03-03: 4 g via INTRAVENOUS
  Filled 2021-03-03: qty 40

## 2021-03-03 MED ORDER — FLEET ENEMA 7-19 GM/118ML RE ENEM
1.0000 | ENEMA | Freq: Once | RECTAL | Status: AC
  Administered 2021-03-03: 1 via RECTAL
  Filled 2021-03-03: qty 1

## 2021-03-03 NOTE — Progress Notes (Signed)
Occupational Therapy Treatment Patient Details Name: Bruce Smith MRN: [id number] DOB: 1951/01/13 Today's Date: 03/03/2021    History of present illness Patient is a 70 year old male with PMH significant for esophageal stricture and myeloma-extensive bone disease-pathologic fracture of left scapula and left femur. Pt recently had ASCT at [facility] on 01/22/2021.  Patient admitted due to weakness and inability to stand at doctor's office. Patient also with persistent nausea and vomiting with eating. S/p EGD 4/27   OT comments  Patient progressing with ADLs but reports regression of mobility in last few days and expressed frustration about this.  Pt was encouraged and reassured on gains made today. Pt showed improved ability to sit EOB and work on LE dressing with use of adaptive equipment for donning and doffing socks and diaper brief with increased time/effort and Min guard assist.  Still requires increased assist to complete dressing in standing due to poor standing balance, need of UE support on RW and knee buckle.  Patient remains limited by generalized weakness, impaired activity tolerance and Bil LE pain from knees down, along with deficits noted below. Pt continues to demonstrate good rehab potential and would benefit from continued skilled OT to increase safety and independence with ADLs and functional transfers to allow pt to return home safely and reduce caregiver burden and fall risk.   Follow Up Recommendations  SNF    Equipment Recommendations   (AE pack "Hip kit")    Recommendations for Other Services      Precautions / Restrictions Precautions Precautions: Fall Precaution Comments: hx falls resulting in scapula, hip and tib fracture within past 4 months Restrictions Weight Bearing Restrictions: No       Mobility Bed Mobility Overal bed mobility: Needs Assistance Bed Mobility: Supine to Sit;Sit to Supine     Supine to sit: Min assist;HOB elevated Sit to supine: Min guard;HOB  elevated   General bed mobility comments: Pt performed supine to sit with increased effort/time and need of Min HHA for trunk control.  Pt able to return to supine with increased effort and ability to lift BLEs onto bed with Min guard for safety.    Transfers Overall transfer level: Needs assistance Equipment used: Rolling walker (2 wheeled) Transfers: Sit to/from Stand Sit to Stand: Mod assist         General transfer comment: Mod Assist for power up from EOB with cues for UE hand placement. Pt took 1 forward step with knee buckle and stated that he did not feel safe or able to pivot to recliner today.  Pt already had extended time sitting EOB for ADL retraining, and agreed to return to supine.    Balance Overall balance assessment: History of Falls;Needs assistance Sitting-balance support: Feet supported;No upper extremity supported Sitting balance-Leahy Scale: Poor Sitting balance - Comments: Pt on EOB of air mattress. Pt will have controlled losses of balance with using UEs for fuctional tasks, but catches self with forearms. Cues to recruit abd muscles for upright sitting and pt agreed to trying, but still with lateral losses of balance. Postural control: Right lateral lean;Left lateral lean Standing balance support: Bilateral upper extremity supported Standing balance-Leahy Scale: Poor Standing balance comment: reliant on walker and external support from therapist. Knee buckle with one step.                           ADL either performed or assessed with clinical judgement   ADL Overall ADL's : Needs assistance/impaired  Grooming: Set up;Sitting         Lower Body Bathing Details (indicate cue type and reason): Pt educated on use of long handled bath sponge/brush to reach LEs while seated in shower to avoid single leg stance.       Lower Body Dressing Details (indicate cue type and reason): seated EOB patient able to doff sock with increased time/effort  and use of reacher.  Pt educated on use of sock aid and attempted standard size but it was too narrow. Pt used extra-wide aid and able to place sock on aid after instruction and on to RT foot only.  Held on LT foot due to pain. Pt educated on use of reacher to don dipaer brief. Pt declined to practice today due to LE pain. OT demonstrated process to him and pt verbalized understanding.             Functional mobility during ADLs: Minimal assistance;Moderate assistance;Rolling walker       Vision Patient Visual Report: No change from baseline     Perception     Praxis      Cognition Arousal/Alertness: Awake/alert Behavior During Therapy: WFL for tasks assessed/performed;Anxious Overall Cognitive Status: Within Functional Limits for tasks assessed                                 General Comments: Anxious when attempting to take a step.        Exercises     Shoulder Instructions       General Comments      Pertinent Vitals/ Pain       Pain Assessment: 0-10 Pain Score: 8  Pain Location: LE's and feet with mobility. LT more painful then RT with dorsal foot edema. Pain Descriptors / Indicators: Grimacing;Guarding Pain Intervention(s): Limited activity within patient's tolerance;Monitored during session;Repositioned  Home Living                                          Prior Functioning/Environment              Frequency  Min 2X/week        Progress Toward Goals  OT Goals(current goals can now be found in the care plan section)  Progress towards OT goals: Progressing toward goals  Acute Rehab OT Goals Patient Stated Goal: legs to stop hurting and not to fall OT Goal Formulation: With patient/family Time For Goal Achievement: 03/10/21 Potential to Achieve Goals: Good  Plan Discharge plan needs to be updated    Co-evaluation                 AM-PAC OT "6 Clicks" Daily Activity     Outcome Measure   Help from  another person eating meals?: None Help from another person taking care of personal grooming?: A Little Help from another person toileting, which includes using toliet, bedpan, or urinal?: A Lot (external male catheter) Help from another person bathing (including washing, rinsing, drying)?: A Lot Help from another person to put on and taking off regular upper body clothing?: A Little Help from another person to put on and taking off regular lower body clothing?: A Lot 6 Click Score: 16    End of Session Equipment Utilized During Treatment: Rolling walker;Gait belt  OT Visit Diagnosis: Muscle weakness (generalized) (M62.81);History of falling (Z91.81);Unsteadiness on  feet (R26.81)   Activity Tolerance Patient limited by fatigue;Patient limited by pain   Patient Left in bed;with call bell/phone within reach;with bed alarm set;with family/visitor present   Nurse Communication Mobility status        Time: 0254-8628 OT Time Calculation (min): 40 min  Charges: OT General Charges $OT Visit: 1 Visit OT Treatments $Self Care/Home Management : 23-37 mins $Therapeutic Activity: 8-22 mins  Anderson Malta, [place] Office: [phone] 03/03/2021   Lulu Hirschmann 03/03/2021, 11:52 AM

## 2021-03-03 NOTE — Progress Notes (Signed)
Bruce Smith might feel a bit better than yesterday.  According to his wife, he did not sleep much last night.  There are still episodes of confusion.  I still am not sure as to why he would have the confusion.  I realize that he is on pain medication.  He needs the pain medication for his legs.  I am not sure why I am his legs hurt so much.  As far as I know, there is no myeloma in the legs.  He is not eating much.  I will try him on some Megace elixir now.  Maybe this can improve his appetite.  Potassium is back low again.  It is 3.1.  His calcium is also low at 7.1.  His magnesium is 1.6.  The albumin is still low at 2.1.  Again, we may have to think about putting a feeding tube in the hand to feed him.  His wife says he just does not eating that much.  I would hate to have to put a feeding tube into him.  Maybe, the Megace will help.  He has had no fever.  He has a condom catheter now.  We will check a urine culture on him.  There is been no issues with bleeding.  I do not think he is doing much away physical therapy right now because of his legs hurting.  We were making a lot of good progress.  I am unsure as to why we are going backwards a little bit.  I cannot think of any medications that he is on that would be causing all these problems.  He has not had a bowel movement for several days.  We can try an enema on him.  I know that nobody is trying their best with him.  This is very puzzling as to why he has any episodes of confusion.  Again I do not know if pain medication could be doing this.  Unfortunately, he needs the pain medication because his legs hurt so much.  I very much appreciate all of the great care that he is getting from all the staff on [facility] [address].   Lattie Haw, MD  2 Cor 12:10

## 2021-03-03 NOTE — Progress Notes (Signed)
Nutrition Follow-up  DOCUMENTATION CODES:   Non-severe (moderate) malnutrition in context of chronic illness  INTERVENTION:   -Needs weight for admission  -Ordered [employer] with whole milk BID, provides 370 kcals and 21g protein  -Beneprotein powder TID with meals, each provides 25 kcals and 6g protein  -Wife to bring daily snacks of pimento cheese and chicken salad -Will also bring [employer] protein shakes from home (provide ~240 kcals and 26g protein) -recommended 2-3 daily   NEW NUTRITION DIAGNOSIS:   Moderate Malnutrition related to chronic illness,cancer and cancer related treatments as evidenced by energy intake < or equal to 75% for > or equal to 1 month,mild fat depletion,mild muscle depletion.  GOAL:   Patient will meet greater than or equal to 90% of their needs  Progressing.  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  He recently had ASCT at [facility] on 01/22/2021.    He has had persistent nausea and vomiting with eating. He is able to keep water down but will vomit with Boost or ensure.The patient has been admitted to the hospital for failure to thrive and management of his symptoms.  Patient in room with sister at bedside. Pt c/o pain but still pleasant and able to answer my questions. Pt reports no appetite but states sometimes his appetite fluctuates. A good day he describes as having bites of meals with a supplement. Per pt's sister, pt ate ~3 bites of an egg and gravy biscuit this morning. Pt having family bring in food from outside. Yesterday pt consumed 5 bites of rice krispies with milk and then drank the rest of the milk for breakfast, bites of a brownie with ice cream and 1/3 of a grilled cheese sandwich.   Pt reports he likes  crackers with chicken salad or pimento cheese. Wife can bring this in for him to snack on. Pt also has drank some Ensure but prefers [employer] protein shakes (wife to bring these in as well). Recommend pt consume 2-3 of these shakes daily. He agreed to drinking  1 in the morning and at night. States he also likes chocolate [employer] packets as well. Will order these as well. Will trial Beneprotein powder in addition.  Per oncology note, pt to be trialed on Megace (is already on Marinol which pt states hasn't helped) and feeding tube is being considered if doesn't improve.  Still no weight measured for admission.  Medications: Marinol, Megace, Ca gluconate  Labs reviewed:  Low Na, K, Mg  NUTRITION - FOCUSED PHYSICAL EXAM:  Flowsheet Row Most Recent Value  Orbital Region Mild depletion  Upper Arm Region Mild depletion  Thoracic and Lumbar Region Unable to assess  Buccal Region Mild depletion  Temple Region Moderate depletion  Clavicle Bone Region Mild depletion  Clavicle and Acromion Bone Region Mild depletion  Scapular Bone Region Mild depletion  Dorsal Hand Mild depletion  Patellar Region Unable to assess  [pain level]  Anterior Thigh Region Unable to assess  Posterior Calf Region Unable to assess  Edema (RD Assessment) Unable to assess       Diet Order:   Diet Order            Diet regular Room service appropriate? Yes; Fluid consistency: Thin  Diet effective now  EDUCATION NEEDS:   No education needs have been identified at this time  Skin:  Skin Assessment: Reviewed RN Assessment  Last BM:  4/30 -type 6  Height:   Ht Readings from Last 1 Encounters:  12/09/20 5\' 8"  (1.727 m)    Weight:   Wt Readings from Last 1 Encounters:  12/09/20 71.7 kg   BMI:  There is no height or weight on file to calculate BMI.  Estimated Nutritional Needs:   Kcal:  2100-2300  Protein:  105-115g  Fluid:  2.3L/day   Clayton Bibles, MS,  RD, LDN Inpatient Clinical Dietitian Contact information available via Amion

## 2021-03-03 NOTE — Progress Notes (Signed)
Physical Therapy Treatment Patient Details Name: Bruce Smith MRN: [id number] DOB: [birth year] Today's Date: 03/03/2021    History of Present Illness Patient is a 70 year old male with PMH significant for esophageal stricture and myeloma-extensive bone disease-pathologic fracture of left scapula and left femur. Pt recently had ASCT at [facility] on 01/22/2021.  Patient admitted due to weakness and inability to stand at doctor's office. Patient also with persistent nausea and vomiting with eating. S/p EGD 4/27    PT Comments    Patient is making slow progress with therapy due to limitation of pain. He is highly motivated and hopeful to improve LE strength and functional independence in order to safely return home with family. Patient has 24/7 assist available from his wife and sister at home and has demonstrated ability to ambulate ~50' with use of rollator for seated rest breaks as needed. Patient has also been educated on seated and supine LE strengthening to complete as able throughout the day. He required Mod Assist for 2x stand pivot transfer with RW to move Bed>Chair>Bed as he was unable to achieve comfortable position in chair and experienced bout of emesis due to pain. Patient had initially hoped for discharge home with HHPT however, given increased weakness and difficulty with mobility he would benefit from more intense therapy at CIR setting. Recommendation updated and acute PT will continue to progress patient as able.   Follow Up Recommendations  CIR     Equipment Recommendations  None recommended by PT    Recommendations for Other Services Rehab consult     Precautions / Restrictions Precautions Precautions: Fall Precaution Comments: hx falls resulting in scapula, hip and tib fracture within past 4 months Restrictions Weight Bearing Restrictions: No    Mobility  Bed Mobility Overal bed mobility: Needs Assistance Bed Mobility: Supine to Sit;Sit to Supine     Supine to sit: HOB  elevated;Min guard Sit to supine: Min assist;HOB elevated        Transfers Overall transfer level: Needs assistance Equipment used: Rolling walker (2 wheeled) Transfers: Sit to/from [facility] Sit to Stand: Mod assist;From elevated surface Stand pivot transfers: Mod assist;From elevated surface       General transfer comment: Mod Assist to initiate and complete power up from EOB and recliner. Pt required assist to facilitate anterior weight shift in standing. Pt able to sequence small side steps to move bed<>chair with assist for walker position. Pt unable to get comfortable in recliner and completed second stand step transfer to returned to bed.  Ambulation/Gait                 Stairs             Wheelchair Mobility    Modified Rankin (Stroke Patients Only)       Balance Overall balance assessment: History of Falls;Needs assistance Sitting-balance support: Feet supported;No upper extremity supported Sitting balance-Leahy Scale: Poor     Standing balance support: Bilateral upper extremity supported Standing balance-Leahy Scale: Poor Standing balance comment: reliant on walker and external support from therapist.                            Cognition Arousal/Alertness: Awake/alert Behavior During Therapy: WFL for tasks assessed/performed;Anxious Overall Cognitive Status: Within Functional Limits for tasks assessed  Exercises Other Exercises Other Exercises: 5 reps bil LE LAQ; 10 reps gluteal set (5 second holds)    General Comments        Pertinent Vitals/Pain Pain Assessment: Faces Faces Pain Scale: Hurts whole lot Pain Location: LE's and feet with mobility Pain Descriptors / Indicators: Grimacing;Guarding Pain Intervention(s): Monitored during session;Repositioned;Limited activity within patient's tolerance    Home Living                      Prior  Function            PT Goals (current goals can now be found in the care plan section) Acute Rehab PT Goals Patient Stated Goal: legs to stop hurting and not to fall PT Goal Formulation: With patient/family Time For Goal Achievement: 03/11/21 Potential to Achieve Goals: Good Progress towards PT goals: Progressing toward goals (slow and limited by LE pain but highly motivated)    Frequency    Min 3X/week      PT Plan Current plan remains appropriate    Co-evaluation              AM-PAC PT "6 Clicks" Mobility   Outcome Measure  Help needed turning from your back to your side while in a flat bed without using bedrails?: None Help needed moving from lying on your back to sitting on the side of a flat bed without using bedrails?: A Little Help needed moving to and from a bed to a chair (including a wheelchair)?: A Lot Help needed standing up from a chair using your arms (e.g., wheelchair or bedside chair)?: A Lot Help needed to walk in hospital room?: A Lot Help needed climbing 3-5 steps with a railing? : Total 6 Click Score: 14    End of Session Equipment Utilized During Treatment: Gait belt Activity Tolerance: Patient limited by pain Patient left: in bed;with call bell/phone within reach;with family/visitor present Nurse Communication: Mobility status PT Visit Diagnosis: Muscle weakness (generalized) (M62.81);Difficulty in walking, not elsewhere classified (R26.2);Unsteadiness on feet (R26.81);Pain     Time: 4287-6811 PT Time Calculation (min) (ACUTE ONLY): 41 min  Charges:  $Therapeutic Exercise: 8-22 mins $Therapeutic Activity: 23-37 mins                     Verner Mould, DPT Acute Rehabilitation Services Office [phone] Pager [phone]     Jacques Navy 03/03/2021, 5:40 PM

## 2021-03-03 NOTE — Progress Notes (Signed)
Palliative care progress note  Reason for visit: Cancer related pain  I saw and examined Bruce Smith today.  He was awake, alert alert, and interactive.  His sister was at the bedside reports he has been having a "okay" day today.  He continues to have periods of confusion but he does appear to remain improved to me from when I saw him a couple of days ago.  He continues to endorse having pain in his legs.  He does describe it as shooting but it is difficult for me to characterize exactly what his pain feels like to him.  We increased both his fentanyl and Lyrica yesterday and he does not report having an appreciable difference in his pain, however, he has used less rescue Dilaudid today versus yesterday.  - Plan to continue with daytime Lyrica dose of 100 mg and nighttime dose of 150 mg.  The pain in his legs has neuropathic component and he was previously on this regimen during his last admission at [facility].  He does not report significant change today, however, may need more time to see if this increase will be effective in better controlling his pain long-term -Continue fentanyl patch to 50 mcg/h -Continue as needed Dilaudid for breakthrough pain.  His usage does appear to be less today than yesterday.  If he continues to have poor pain control and confusion, we could consider rotating to a different short acting opioid.  I talked with his sister about keeping regimen the same today and reassessing how he is doing tomorrow.  If he is not continuing to improve, I will reach out and coordinate further with Dr. [name] to discuss if we should try further adjustments to his current regimen.  Total time: 25 minutes  Greater than 50%  of this time was spent counseling and coordinating care related to the above assessment and plan.  Micheline Rough, MD [place] Team [phone]

## 2021-03-04 DIAGNOSIS — C419 Malignant neoplasm of bone and articular cartilage, unspecified: Secondary | ICD-10-CM | POA: Diagnosis not present

## 2021-03-04 DIAGNOSIS — E876 Hypokalemia: Secondary | ICD-10-CM | POA: Diagnosis not present

## 2021-03-04 DIAGNOSIS — G893 Neoplasm related pain (acute) (chronic): Secondary | ICD-10-CM | POA: Diagnosis not present

## 2021-03-04 DIAGNOSIS — R52 Pain, unspecified: Secondary | ICD-10-CM | POA: Diagnosis not present

## 2021-03-04 DIAGNOSIS — Z515 Encounter for palliative care: Secondary | ICD-10-CM | POA: Diagnosis not present

## 2021-03-04 DIAGNOSIS — R410 Disorientation, unspecified: Secondary | ICD-10-CM | POA: Diagnosis not present

## 2021-03-04 DIAGNOSIS — C9000 Multiple myeloma not having achieved remission: Secondary | ICD-10-CM | POA: Diagnosis not present

## 2021-03-04 LAB — COMPREHENSIVE METABOLIC PANEL
ALT: 8 U/L (ref 0–44)
AST: 12 U/L — ABNORMAL LOW (ref 15–41)
Albumin: 2.2 g/dL — ABNORMAL LOW (ref 3.5–5.0)
Alkaline Phosphatase: 64 U/L (ref 38–126)
Anion gap: 8 (ref 5–15)
BUN: <5 mg/dL — ABNORMAL LOW (ref 8–23)
CO2: 21 mmol/L — ABNORMAL LOW (ref 22–32)
Calcium: 7.9 mg/dL — ABNORMAL LOW (ref 8.9–10.3)
Chloride: 112 mmol/L — ABNORMAL HIGH (ref 98–111)
Creatinine, Ser: 0.36 mg/dL — ABNORMAL LOW (ref 0.61–1.24)
GFR, Estimated: >60 mL/min (ref >60)
Glucose, Bld: 95 mg/dL (ref 70–99)
Potassium: 3.6 mmol/L (ref 3.5–5.1)
Sodium: 141 mmol/L (ref 135–145)
Total Bilirubin: 0.3 mg/dL (ref 0.3–1.2)
Total Protein: 4.2 g/dL — ABNORMAL LOW (ref 6.5–8.1)

## 2021-03-04 LAB — CBC WITH DIFFERENTIAL/PLATELET
Abs Immature Granulocytes: 0.03 10*3/uL (ref 0.00–0.07)
Basophils Absolute: 0 10*3/uL (ref 0.0–0.1)
Basophils Relative: 0 %
Eosinophils Absolute: 0.1 10*3/uL (ref 0.0–0.5)
Eosinophils Relative: 1 %
HCT: 29.6 % — ABNORMAL LOW (ref 39.0–52.0)
Hemoglobin: 9.9 g/dL — ABNORMAL LOW (ref 13.0–17.0)
Immature Granulocytes: 1 %
Lymphocytes Relative: 10 %
Lymphs Abs: 0.6 10*3/uL — ABNORMAL LOW (ref 0.7–4.0)
MCH: 31.3 pg (ref 26.0–34.0)
MCHC: 33.4 g/dL (ref 30.0–36.0)
MCV: 93.7 fL (ref 80.0–100.0)
Monocytes Absolute: 0.8 10*3/uL (ref 0.1–1.0)
Monocytes Relative: 12 %
Neutro Abs: 5 10*3/uL (ref 1.7–7.7)
Neutrophils Relative %: 76 %
Platelets: 142 10*3/uL — ABNORMAL LOW (ref 150–400)
RBC: 3.16 MIL/uL — ABNORMAL LOW (ref 4.22–5.81)
RDW: 14.8 % (ref 11.5–15.5)
WBC: 6.4 10*3/uL (ref 4.0–10.5)
nRBC: 0 % (ref 0.0–0.2)

## 2021-03-04 LAB — PHOSPHORUS: Phosphorus: 1.5 mg/dL — ABNORMAL LOW (ref 2.5–4.6)

## 2021-03-04 LAB — MAGNESIUM: Magnesium: 1.7 mg/dL (ref 1.7–2.4)

## 2021-03-04 LAB — GLUCOSE, CAPILLARY: Glucose-Capillary: 111 mg/dL — ABNORMAL HIGH (ref 70–99)

## 2021-03-04 MED ORDER — DEXTROSE 5 % IV SOLN
400.0000 mg | Freq: Two times a day (BID) | INTRAVENOUS | Status: DC
  Administered 2021-03-04 – 2021-03-05 (×4): 400 mg via INTRAVENOUS
  Filled 2021-03-04 (×5): qty 8

## 2021-03-04 MED ORDER — PANTOPRAZOLE SODIUM 40 MG IV SOLR
40.0000 mg | Freq: Two times a day (BID) | INTRAVENOUS | Status: DC
Start: 2021-03-04 — End: 2021-03-06
  Administered 2021-03-04 – 2021-03-05 (×4): 40 mg via INTRAVENOUS
  Filled 2021-03-04 (×4): qty 40

## 2021-03-04 MED ORDER — INSULIN ASPART 100 UNIT/ML IJ SOLN: 0.0000 [IU] | INTRAMUSCULAR | Status: DC

## 2021-03-04 MED ORDER — SODIUM CHLORIDE 0.9 % IV SOLN: INTRAVENOUS | Status: DC

## 2021-03-04 MED ORDER — SULFACETAMIDE SODIUM 10 % OP SOLN
1.0000 [drp] | OPHTHALMIC | Status: DC
  Administered 2021-03-04 – 2021-03-10 (×33): 1 [drp] via OPHTHALMIC
  Filled 2021-03-04: qty 15

## 2021-03-04 MED ORDER — TRACE MINERALS CU-MN-SE-ZN 300-55-60-3000 MCG/ML IV SOLN
INTRAVENOUS | Status: AC
  Filled 2021-03-04: qty 528

## 2021-03-04 MED ORDER — ACETAMINOPHEN 10 MG/ML IV SOLN
1000.0000 mg | Freq: Four times a day (QID) | INTRAVENOUS | Status: AC
  Administered 2021-03-04 – 2021-03-05 (×4): 1000 mg via INTRAVENOUS
  Filled 2021-03-04 (×4): qty 100

## 2021-03-04 MED ORDER — LORAZEPAM 2 MG/ML IJ SOLN: 0.5000 mg | INTRAMUSCULAR | Status: DC | PRN

## 2021-03-04 MED ORDER — POTASSIUM PHOSPHATES 15 MMOLE/5ML IV SOLN
15.0000 mmol | Freq: Once | INTRAVENOUS | Status: AC
  Administered 2021-03-04: 15 mmol via INTRAVENOUS
  Filled 2021-03-04: qty 5

## 2021-03-04 MED ORDER — MAGNESIUM SULFATE 2 GM/50ML IV SOLN
2.0000 g | Freq: Once | INTRAVENOUS | Status: AC
  Administered 2021-03-04: 2 g via INTRAVENOUS
  Filled 2021-03-04: qty 50

## 2021-03-04 NOTE — Progress Notes (Signed)
Palliative care progress note  Reason for visit: Cancer-related pain, complicated by worsening confusion  I saw and examined Bruce Smith today.  He was sleeping at time of my encounter.  His wife and sister were at the bedside and report that he had a terrible night where he did not sleep and was agitated for most of the night.  We discussed his confusion and concern that he is developing delirium.  We discussed the multifactorial nature of delirium and how this complicates knowing exactly what all is contributing to the overall confusion.  We did review standard delirium management as below and discussed how Dr. [name] is working to minimize factors that may be contributing to his confusion.  We discussed stopping pain medication and Levsin and limiting benzos if possible.  It appears he had agitation earlier today that did require dose of Ativan.  Discussed with family and bedside nurse.  I briefly reviewed recommendations below with family as his family is a consistent presence at the bedside with Bruce Smith and can continue to help with nonpharmacologic interventions to help with confusion.  Standard delirium management (adapted from NICE guidelines 2011 for prevention of delirium):  Provide continuity of care when possible (avoid frequent changing of surroundings and staff).  Frequent reorientation to time with:  A clock should always be visible.  Make sure Calendar/white board is updated.  Lights on/blinds open during the day and off/closed at night.  Encourage frequent family visits.  Monitor for and treat dehydration/constipation.  Optimize oxygen saturation.  Avoid catheters and IV's when possible and look for/treat infections.  Encourage early mobility.  Assess and treat pain Opioids stopped and trial of high dose tylenol for pain management.  Also continued on lyrica dose that he tolerated well at [facility].  Ensure adequate nutrition and functioning dentures.  Address reversible causes  of hearing and visual impairment:  Use pocket talker if hearing aids are unavailable.  Avoid sleep disturbance (normalize sleep/wake cycle).  Minimize disturbances and consider NOT obtaining vitals at night if possible.  Review Medications to avoid polypharmacy and avoid deliriogenic medications when possible:  Benzodiazepines Would minimize ativan use as much as possible.  Dihydropyridines.  Antihistamines.  Anticholinergics Agree with stopping Levsin.  (Possibly avoid: H2 blockers, tricyclic antidepressants, antiparkinson medications, steroids, NSAID's).   I also coordinated with Dr. [name] today using secure staff messaging.  Total time: 25 minutes  Greater than 50%  of this time was spent counseling and coordinating care related to the above assessment and plan.  Micheline Rough, MD [place] Team [phone]

## 2021-03-04 NOTE — Progress Notes (Addendum)
Nutrition Follow-up  DOCUMENTATION CODES:   Non-severe (moderate) malnutrition in context of chronic illness  INTERVENTION:   Monitor magnesium, potassium, and phosphorus daily for at least 3 days, MD to replete as needed, as pt is at risk for refeeding syndrome.  -Needs weight for admission  -TPN management per Pharmacy  -Resume supplements when confusion improves (Beneprotein, CIB, daily snacks, [employer] Power)  NUTRITION DIAGNOSIS:   Moderate Malnutrition related to chronic illness,cancer and cancer related treatments as evidenced by energy intake < or equal to 75% for > or equal to 1 month,mild fat depletion,mild muscle depletion.  Ongoing.  GOAL:   Patient will meet greater than or equal to 90% of their needs  Not meeting.  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  He recently had ASCT at [facility] on 01/22/2021.    He has had persistent nausea and vomiting with eating. He is able to keep water down but will vomit with Boost or ensure.The patient has been admitted to the hospital for failure to thrive and management of his symptoms.  Patient with worsening confusion. Per oncology note, may be d/t pain medications. Stopping those and appetite stimulants. MD d/c Beneprotein (pt accepted 1 dose yesterday). PO intakes mainly 0% completion with some fluid intake.  TPN to be initiated today. Recommend monitor for refeeding syndrome. Per Pharmacy note, TPN to start at 40 ml/hr (providing 1008 kcals and 52g protein).  Diet still is regular. Once confusion improves, can resume supplements.   Still no weight measured for admission.  Medications: IV Mg sulfate, IV Zofran, K-Phos  Labs reviewed:  Low Phos Mg WNL  Diet Order:    Diet Order            Diet regular Room service appropriate? Yes; Fluid consistency: Thin  Diet effective now                 EDUCATION NEEDS:   No education needs have been identified at this time  Skin:  Skin Assessment: Reviewed RN Assessment  Last BM:  5/4 -type 6  Height:   Ht Readings from Last 1 Encounters:  12/09/20 5\' 8"  (1.727 m)    Weight:   Wt Readings from Last 1 Encounters:  12/09/20 71.7 kg   BMI:  There is no height or weight on file to calculate BMI.  Estimated Nutritional Needs:   Kcal:  2100-2300  Protein:  105-115g  Fluid:  2.3L/day  Clayton Bibles, MS, RD, LDN Inpatient Clinical Dietitian Contact information available via Amion

## 2021-03-04 NOTE — Progress Notes (Signed)
PHARMACY - TOTAL PARENTERAL NUTRITION CONSULT NOTE   Indication: confusion, no plans for entereal feeding per MD  Patient Measurements:     There is no height or weight on file to calculate BMI. 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 He is s/p stem cell transplant at [facility] on 01/22/2021.  He has had persistent nausea and vomiting with eating, admitted for failure to thrive and management of his symptoms. 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    Pharmacy is consulted to start TPN as patient is more confused and unable to take PO nutrition.  MD does not want to place a feeding tube.  Glucose / Insulin: No hx DM. AM glucose 95. Electrolytes: K/Mg/Ca persistently low requiring replacement.  Renal: BUN/SCr low, UOP adequate. Hepatic: AST low, others WNL. Alb low 2.2 Intake / Output; MIVF: NS 50 ml/hr. I/I -7L since admit. GI Imaging: GI Surgeries / Procedures:   Central access: PICC already in place TPN start date: 5/5  Nutritional Goals (per RD recommendation on 5/4): Kcal:  2100-2300, Protein:  105-115g, Fluid:  2.3L/day Goal TPN rate is 80 mL/hr (provides 105 g of protein and 2016 kcals per day)  Current Nutrition:  Regular diet but too confused to eat  Plan:  Now: Mag Sulfate 2g IV x 1; Potassium Phosphate 69mmol IV x 1  Start TPN at 40 mL/hr at 1800  This will provide 53g protein, 1008kcal Electrolytes in TPN:   Na 101mEq/L   K 38mEq/L  Ca 80mEq/L  Mg 17mEq/L  Phos 84mmol/L   Cl:Ac 1:2 Add standard MVI and trace elements to TPN Initiate Sensitive q8h SSI and adjust as needed  Continue MIVF at 50 mL/hr Monitor TPN labs on Mon/Thurs, full panel in AM  Peggyann Juba, PharmD, BCPS Pharmacy: [phone] 03/04/2021,7:52 AM

## 2021-03-04 NOTE — Progress Notes (Signed)
Rehab Admissions Coordinator Note:  Patient was screened by Cleatrice Burke for appropriateness for an Inpatient Acute Rehab Consult per PT recs.  At this time, we are recommending Inpatient Rehab consult. I will contact Dr. [name] to request.  Cleatrice Burke RN MSN 03/04/2021, 10:04 AM  I can be reached at [phone].

## 2021-03-04 NOTE — Progress Notes (Signed)
Unfortunately, Mr. Day is now much more confused.  I have to believe this is from his pain medication.  As such, I am going to stop his pain medications.  I just hate that he is so disoriented now.  I am just not sure what else we can do.  I really do not think this is anything that is related to the myeloma or to his stem cell transplant.  I think he is going to be fed parenterally.  He is not able to take anything enterally.  I do not want to put a feeding tube down him because I think he will pull it out.  I do not want to have him restrained.  His labs look better.  Potassium 3.6.  Calcium 7.9.  His albumin is 2.2.  His white cell count 6.4.  Hemoglobin 9.9.  Platelet count 142,000.  His vitamin D level is 31.  He is not febrile.  I do not think he has any infection.  I will check cultures on him just to make sure that there is no infection.  His blood pressure is 144/79.  Pulse is 88.  He is afebrile.  His lungs still sound clear.  Cardiac exam regular rate and rhythm.  He has no murmurs.  Abdomen is soft.  There is no guarding or rebound tenderness.  He has no fluid wave.  Neurological exam shows no focal neurological deficits.  Again he has confusion.  He is disoriented.  I just hate that he is going backwards.  He is making great progress and then on Monday he seemed to start having issues.  Again, I would stop his pain medication.  With his confusion, I cannot imagine that pain is going to be a problem.  I would just have him on some Tylenol.  I will feed him with TNA for right now.  Again, this is quite puzzling.  I really have appreciate the fantastic care and compassion that all the staff on 6 E. are showing.  I know his wife is trying to help as much as she can.  She is really a big plus for [place].  Lattie Haw, MD  Darlyn Chamber 29:11

## 2021-03-05 ENCOUNTER — Inpatient Hospital Stay (HOSPITAL_COMMUNITY): Payer: BC Managed Care – PPO

## 2021-03-05 DIAGNOSIS — M79606 Pain in leg, unspecified: Secondary | ICD-10-CM

## 2021-03-05 DIAGNOSIS — C419 Malignant neoplasm of bone and articular cartilage, unspecified: Secondary | ICD-10-CM | POA: Diagnosis not present

## 2021-03-05 DIAGNOSIS — M7989 Other specified soft tissue disorders: Secondary | ICD-10-CM

## 2021-03-05 DIAGNOSIS — C9000 Multiple myeloma not having achieved remission: Secondary | ICD-10-CM | POA: Diagnosis not present

## 2021-03-05 DIAGNOSIS — E876 Hypokalemia: Secondary | ICD-10-CM | POA: Diagnosis not present

## 2021-03-05 DIAGNOSIS — E440 Moderate protein-calorie malnutrition: Secondary | ICD-10-CM | POA: Insufficient documentation

## 2021-03-05 LAB — COMPREHENSIVE METABOLIC PANEL
ALT: 9 U/L (ref 0–44)
ALT: 8 U/L (ref 0–44)
ALT: 9 U/L (ref 0–44)
AST: 11 U/L — ABNORMAL LOW (ref 15–41)
AST: 12 U/L — ABNORMAL LOW (ref 15–41)
AST: 12 U/L — ABNORMAL LOW (ref 15–41)
Albumin: 2.2 g/dL — ABNORMAL LOW (ref 3.5–5.0)
Albumin: 1.9 g/dL — ABNORMAL LOW (ref 3.5–5.0)
Albumin: 2.2 g/dL — ABNORMAL LOW (ref 3.5–5.0)
Alkaline Phosphatase: 65 U/L (ref 38–126)
Alkaline Phosphatase: 60 U/L (ref 38–126)
Alkaline Phosphatase: 64 U/L (ref 38–126)
Anion gap: 5 (ref 5–15)
Anion gap: 6 (ref 5–15)
Anion gap: 3 — ABNORMAL LOW (ref 5–15)
BUN: 7 mg/dL — ABNORMAL LOW (ref 8–23)
BUN: 7 mg/dL — ABNORMAL LOW (ref 8–23)
BUN: 9 mg/dL (ref 8–23)
CO2: 21 mmol/L — ABNORMAL LOW (ref 22–32)
CO2: 17 mmol/L — ABNORMAL LOW (ref 22–32)
CO2: 20 mmol/L — ABNORMAL LOW (ref 22–32)
Calcium: 7.4 mg/dL — ABNORMAL LOW (ref 8.9–10.3)
Calcium: 6.4 mg/dL — CL (ref 8.9–10.3)
Calcium: 7.3 mg/dL — ABNORMAL LOW (ref 8.9–10.3)
Chloride: 112 mmol/L — ABNORMAL HIGH (ref 98–111)
Chloride: 96 mmol/L — ABNORMAL LOW (ref 98–111)
Chloride: 112 mmol/L — ABNORMAL HIGH (ref 98–111)
Creatinine, Ser: 0.35 mg/dL — ABNORMAL LOW (ref 0.61–1.24)
Creatinine, Ser: 0.45 mg/dL — ABNORMAL LOW (ref 0.61–1.24)
Creatinine, Ser: 0.43 mg/dL — ABNORMAL LOW (ref 0.61–1.24)
GFR, Estimated: >60 mL/min (ref >60)
GFR, Estimated: >60 mL/min (ref >60)
GFR, Estimated: >60 mL/min (ref >60)
Glucose, Bld: 112 mg/dL — ABNORMAL HIGH (ref 70–99)
Glucose, Bld: 648 mg/dL (ref 70–99)
Glucose, Bld: 108 mg/dL — ABNORMAL HIGH (ref 70–99)
Potassium: 3.3 mmol/L — ABNORMAL LOW (ref 3.5–5.1)
Potassium: 3.4 mmol/L — ABNORMAL LOW (ref 3.5–5.1)
Potassium: 4 mmol/L (ref 3.5–5.1)
Sodium: 138 mmol/L (ref 135–145)
Sodium: 119 mmol/L — CL (ref 135–145)
Sodium: 135 mmol/L (ref 135–145)
Total Bilirubin: 0.3 mg/dL (ref 0.3–1.2)
Total Bilirubin: 0.4 mg/dL (ref 0.3–1.2)
Total Bilirubin: 0.2 mg/dL — ABNORMAL LOW (ref 0.3–1.2)
Total Protein: 4.1 g/dL — ABNORMAL LOW (ref 6.5–8.1)
Total Protein: 3.7 g/dL — ABNORMAL LOW (ref 6.5–8.1)
Total Protein: 3.9 g/dL — ABNORMAL LOW (ref 6.5–8.1)

## 2021-03-05 LAB — PREALBUMIN: Prealbumin: 5.6 mg/dL — ABNORMAL LOW (ref 18–38)

## 2021-03-05 LAB — CBC
HCT: 29.4 % — ABNORMAL LOW (ref 39.0–52.0)
Hemoglobin: 9.8 g/dL — ABNORMAL LOW (ref 13.0–17.0)
MCH: 31.4 pg (ref 26.0–34.0)
MCHC: 33.3 g/dL (ref 30.0–36.0)
MCV: 94.2 fL (ref 80.0–100.0)
Platelets: 145 10*3/uL — ABNORMAL LOW (ref 150–400)
RBC: 3.12 MIL/uL — ABNORMAL LOW (ref 4.22–5.81)
RDW: 15.3 % (ref 11.5–15.5)
WBC: 4.5 10*3/uL (ref 4.0–10.5)
nRBC: 0 % (ref 0.0–0.2)

## 2021-03-05 LAB — DIFFERENTIAL
Abs Immature Granulocytes: 0.02 10*3/uL (ref 0.00–0.07)
Basophils Absolute: 0 10*3/uL (ref 0.0–0.1)
Basophils Relative: 0 %
Eosinophils Absolute: 0.2 10*3/uL (ref 0.0–0.5)
Eosinophils Relative: 4 %
Immature Granulocytes: 0 %
Lymphocytes Relative: 13 %
Lymphs Abs: 0.6 10*3/uL — ABNORMAL LOW (ref 0.7–4.0)
Monocytes Absolute: 0.5 10*3/uL (ref 0.1–1.0)
Monocytes Relative: 12 %
Neutro Abs: 3.2 10*3/uL (ref 1.7–7.7)
Neutrophils Relative %: 71 %

## 2021-03-05 LAB — TRIGLYCERIDES: Triglycerides: 90 mg/dL (ref <150)

## 2021-03-05 LAB — PHOSPHORUS: Phosphorus: 2.4 mg/dL — ABNORMAL LOW (ref 2.5–4.6)

## 2021-03-05 LAB — GLUCOSE, CAPILLARY
Glucose-Capillary: 106 mg/dL — ABNORMAL HIGH (ref 70–99)
Glucose-Capillary: 101 mg/dL — ABNORMAL HIGH (ref 70–99)

## 2021-03-05 LAB — MAGNESIUM: Magnesium: 2 mg/dL (ref 1.7–2.4)

## 2021-03-05 MED ORDER — POTASSIUM PHOSPHATES 15 MMOLE/5ML IV SOLN
10.0000 mmol | Freq: Once | INTRAVENOUS | Status: AC
  Administered 2021-03-05: 10 mmol via INTRAVENOUS
  Filled 2021-03-05: qty 3.33

## 2021-03-05 MED ORDER — KETOROLAC TROMETHAMINE 15 MG/ML IJ SOLN: 30.0000 mg | Freq: Three times a day (TID) | INTRAMUSCULAR | Status: DC

## 2021-03-05 MED ORDER — POTASSIUM CHLORIDE 10 MEQ/100ML IV SOLN
10.0000 meq | Freq: Once | INTRAVENOUS | Status: AC
  Administered 2021-03-05: 10 meq via INTRAVENOUS

## 2021-03-05 MED ORDER — POTASSIUM CHLORIDE 10 MEQ/100ML IV SOLN
10.0000 meq | INTRAVENOUS | Status: AC
  Administered 2021-03-05 (×3): 10 meq via INTRAVENOUS
  Filled 2021-03-05 (×3): qty 100

## 2021-03-05 MED ORDER — TRAVASOL 10 % IV SOLN
INTRAVENOUS | Status: AC
  Filled 2021-03-05: qty 528

## 2021-03-05 MED ORDER — KETOROLAC TROMETHAMINE 15 MG/ML IJ SOLN
15.0000 mg | Freq: Three times a day (TID) | INTRAMUSCULAR | Status: DC
  Administered 2021-03-05 – 2021-03-08 (×9): 15 mg via INTRAVENOUS
  Filled 2021-03-05 (×9): qty 1

## 2021-03-05 MED ORDER — ACETAMINOPHEN 10 MG/ML IV SOLN
1000.0000 mg | Freq: Four times a day (QID) | INTRAVENOUS | Status: AC
  Administered 2021-03-05 – 2021-03-06 (×4): 1000 mg via INTRAVENOUS
  Filled 2021-03-05 (×4): qty 100

## 2021-03-05 NOTE — Progress Notes (Signed)
Thankfully, Bruce Smith is back to oriented.  Again, I have to believe that the disorientation was from his pain medications.  He is now off pain medications.  I have him on some IV Tylenol.  I will put him on some IV Toradol.  I really would like to try to hold off on reintroducing pain medication to him.  He is on TPN now.  I would like to keep him on TPN just because he can have another episode of disorientation.  I just want to make sure that we are getting nutrition into him.  If he does well today, then we might be able to stop the TPN over the weekend.  He still is having problems with his labs.  The potassium is 3.3.  His calcium is 7.4.  His prealbumin is only 5.6.  He clearly is quite malnourished.  Hopefully if he maintains his orientated state, he will be able to take in oral nutrition.  His CBC shows a white cell count 4.5.  Hemoglobin 9.8.  Platelet count 145,000.  His vital signs show temperature of 98.1.  Pulse 74.  Blood pressure 131/82.  His head exam shows no thrush.  He has no adenopathy in the neck.  There is no scleral icterus.  Lungs are clear bilaterally.  Cardiac exam regular rate and rhythm.  Abdomen is soft.  Bowel sounds are definitely active.  Extremities does show tenderness over his legs.  There is no swelling.  Neurological exam shows no focal neurological deficits.  I am just grateful for the fact that he is now much more oriented.  Again his pain medication had to be the culprit.  I am checking cultures on him.  I do not see anything that is obvious as a infectious source.  His nutritional state is quite poor.  Hopefully he will be able to eat more.  If he does well in the next day or so, then we will stop the TPN.  I very much appreciate the outstanding care that he is gotten from all the staff on 6 E.  His wife is very appreciative of the staff's compassion.  Bruce Haw, MD  Romans 8:28

## 2021-03-05 NOTE — Progress Notes (Addendum)
PHARMACY - TOTAL PARENTERAL NUTRITION CONSULT NOTE   Indication: confusion, no plans for entereal feeding per MD  Patient Measurements:     There is no height or weight on file to calculate BMI. 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 He is s/p stem cell transplant at [facility] on 01/22/2021.  He has had persistent nausea and vomiting with eating, admitted for failure to thrive and management of his symptoms. 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    Pharmacy is consulted to start TPN as patient is more confused and unable to take PO nutrition.  MD does not want to place a feeding tube.  Glucose / Insulin: No hx DM. AM glucose 112, CBGs 106-111 since starting TPN - no SSI required. Electrolytes:  - K low (3.3),  - Phos slightly low (2.4) but improved after KPhos yesterday.  - Mg now wnl (2.0) after Mg sulfate 2g yesterday.  - Corrected Ca at low end normal (8.84).  - Cl slightly elevated/CO2 slightly low. Renal: BUN/SCr low, UOP adequate. Hepatic: AST low, others WNL. Alb low 2.2 Prealbumin: 5.6 (5/6) Triglycerides: WNL (5/6) Intake / Output; MIVF: NS 50 ml/hr. I/O -7L since admit. GI Imaging: GI Surgeries / Procedures:   Central access: PICC already in place TPN start date: 5/5  Nutritional Goals (per RD recommendation on 5/4): Kcal:  2100-2300, Protein:  105-115g, Fluid:  2.3L/day Goal TPN rate is 80 mL/hr (provides 105 g of protein and 2016 kcals per day)  Current Nutrition:  Regular diet but too confused to eat  Plan:  Now: KCl 59mEq IV x 4 runs; Potassium Phosphate 51mmol IV x 1 (~15 mEq K+)  Continue TPN at 40 mL/hr due to risk of refeeding syndrome  This will provide 53g protein, 1008kcal Electrolytes in TPN:   Na 68mEq/L   K 57mEq/L (increase)  Ca  36mEq/L  Mg 29mEq/L  Phos 65mmol/L   Cl:Ac 1:2 Add standard MVI and trace elements to TPN Initiate Sensitive q8h SSI and adjust as needed  Continue MIVF at 50 mL/hr Monitor TPN labs on Mon/Thurs, full panel in AM  Peggyann Juba, PharmD, [place]: [phone] 03/05/2021,7:23 AM

## 2021-03-05 NOTE — Progress Notes (Signed)
   03/05/21 1341  Provider Notification  Provider Name/Title Dr. [name]  Date Provider Notified 03/05/21  Time Provider Notified 1338  Notification Type Call  Notification Reason Critical result  Test performed and critical result  (Na+ 119, Glucose 648, Ca+ 6.4)  Date Critical Result Received 03/05/21  Time Critical Result Received 1338  Provider response See new orders (Redraw labs)  Date of Provider Response 03/05/21  Time of Provider Response 1338

## 2021-03-05 NOTE — Progress Notes (Signed)
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 Pt recently had ASCT at [facility] on 01/22/2021.  Patient admitted due to weakness and inability to stand at doctor's office. Patient also with persistent nausea and vomiting with eating. S/p EGD 4/27   OT comments  Treatment focused on working on activity tolerance and OOB activity. Patient min guard for supine to sit, min assist to stand from low bed height and min guard with RW to take steps to recliner. Patient tossed pillow back and forth with therapist x 5 minutes in seated position. Patient reports preferred leisure activity is fishing. Patient and family interested in [place]. Patient is motivated to participate in rehab and progress with therapy. Recommend aggressive inpatient at rehab at discharge.   Follow Up Recommendations  CIR    Equipment Recommendations  Other (comment) (AE kit)    Recommendations for Other Services      Precautions / Restrictions Precautions Precautions: Fall Precaution Comments: hx falls resulting in scapula, hip and tib fracture within past 4 months Restrictions Weight Bearing Restrictions: No       Mobility Bed Mobility Overal bed mobility: Needs Assistance Bed Mobility: Supine to Sit     Supine to sit: HOB elevated;Min guard     General bed mobility comments: Patient able to transfer to  side of bed without physical assistance.    Transfers Overall transfer level: Needs assistance Equipment used: Rolling walker (2 wheeled) Transfers: Sit to/from [facility] Sit to Stand: Min assist Stand pivot transfers: Min guard       General transfer comment: Min assist to stand from bed height. Min guard with RW to take steps to  recliner.    Balance Overall balance assessment: Mild deficits observed, not formally tested                                         ADL either performed or assessed with clinical judgement   ADL Overall ADL's : Needs assistance/impaired     Grooming: Set up;Sitting;Wash/dry face Grooming Details (indicate cue type and reason): washed face sitting at side of bed.                                     Vision Patient Visual Report: No change from baseline     Perception     Praxis      Cognition Arousal/Alertness: Awake/alert Behavior During Therapy: WFL for tasks assessed/performed Overall Cognitive Status: Within Functional Limits for tasks assessed                                          Exercises Other Exercises Other Exercises: Tossed pillow x 5 minutes in recliner to promote UE exercises   Shoulder Instructions       General Comments      Pertinent Vitals/ Pain       Pain Assessment: Faces Faces Pain Scale: Hurts a little bit Pain Location: LE's and feet with mobility Pain Descriptors / Indicators: Grimacing;Guarding Pain  Intervention(s): Limited activity within patient's tolerance;Monitored during session  Home Living                                          Prior Functioning/Environment              Frequency  Min 2X/week        Progress Toward Goals  OT Goals(current goals can now be found in the care plan section)  Progress towards OT goals: Progressing toward goals  Acute Rehab OT Goals Patient Stated Goal: legs to stop hurting and not to fall OT Goal Formulation: With patient/family Time For Goal Achievement: 03/10/21 Potential to Achieve Goals: Good  Plan Discharge plan needs to be updated    Co-evaluation                 AM-PAC OT "6 Clicks" Daily Activity     Outcome Measure   Help from another person eating meals?: None Help from another person  taking care of personal grooming?: A Little Help from another person toileting, which includes using toliet, bedpan, or urinal?: A Lot Help from another person bathing (including washing, rinsing, drying)?: A Lot Help from another person to put on and taking off regular upper body clothing?: A Little Help from another person to put on and taking off regular lower body clothing?: A Lot 6 Click Score: 16    End of Session Equipment Utilized During Treatment: Rolling walker  OT Visit Diagnosis: Muscle weakness (generalized) (M62.81);History of falling (Z91.81);Unsteadiness on feet (R26.81)   Activity Tolerance Patient limited by fatigue   Patient Left in bed;with call bell/phone within reach;with family/visitor present   Nurse Communication Mobility status        Time: 2458-0998 OT Time Calculation (min): 16 min  Charges: OT General Charges $OT Visit: 1 Visit OT Treatments $Therapeutic Activity: 8-22 mins  Derl Barrow, OTR/L [employer]  Office [phone] Pager: [place] 03/05/2021, 1:32 PM

## 2021-03-05 NOTE — Progress Notes (Signed)
Physical Therapy Treatment Patient Details Name: Bruce Smith MRN: [id number] DOB: August 06, 1951 Today's Date: 03/05/2021    History of Present Illness Patient is a 70 year old male with PMH significant for esophageal stricture and myeloma-extensive bone disease-pathologic fracture of left scapula and left femur. Pt recently had ASCT at [facility] on 01/22/2021.  Patient admitted due to weakness and inability to stand at doctor's office. Patient also with persistent nausea and vomiting with eating. S/p EGD 4/27    PT Comments    Patient improving today and was able to initiate power up with only min assist to steady rise. No significant posterior lean today and pt able to take small steps to move bed>chair with assist for walker position but no buckling at LE's. He completed repeated sit<>stands for LE strengthening and then required extended recovery in supine due to pain. Pt completed 2x5 reps for all supine exercises to allow recovery as he became nauseated with exercises 2/2 pain. He remain highly motivated and interested in CIR for rehab to maximize strength and functional mobility in order to safely return home with assist from family. Acute PT will continue to progress pt as able.   Follow Up Recommendations  CIR     Equipment Recommendations  None recommended by PT    Recommendations for Other Services Rehab consult     Precautions / Restrictions Precautions Precautions: Fall Precaution Comments: hx falls resulting in scapula, hip and tib fracture within past 4 months Restrictions Weight Bearing Restrictions: No    Mobility  Bed Mobility Overal bed mobility: Needs Assistance Bed Mobility: Sit to Supine     Supine to sit: HOB elevated;Min guard Sit to supine: Min guard   General bed mobility comments: guarding for safety, pt able to raise bil LE's into bed without assist today.    Transfers Overall transfer level: Needs assistance Equipment used: Rolling walker (2  wheeled) Transfers: Sit to/from [facility] Sit to Stand: Min assist Stand pivot transfers: Min assist       General transfer comment: Min assist to steady with rise from recliner, pt using 1 UE on armrest to initiate power up. pt with decreased posterior lean today. Min assist for walker to step chair>bed. Pt performed repeated sit<>stand at elevated EOB for strengthening.  Ambulation/Gait                 Stairs             Wheelchair Mobility    Modified Rankin (Stroke Patients Only)       Balance Overall balance assessment: Needs assistance Sitting-balance support: Feet supported Sitting balance-Leahy Scale: Fair     Standing balance support: Bilateral upper extremity supported Standing balance-Leahy Scale: Poor Standing balance comment: reliant on walker                            Cognition Arousal/Alertness: Awake/alert Behavior During Therapy: WFL for tasks assessed/performed Overall Cognitive Status: Within Functional Limits for tasks assessed                                        Exercises Other Exercises Other Exercises: Tossed pillow x 5 minutes in recliner to promote UE exercises Other Exercises: 5 reps Sit<>Stand from EOB, elevated. Other Exercises: 2x 5 reps supine bridge and supine hip abduction bil LE.    General Comments  Pertinent Vitals/Pain Pain Assessment: Faces Faces Pain Scale: Hurts even more Pain Location: LE's and feet with mobility and exercise Pain Descriptors / Indicators: Discomfort;Grimacing Pain Intervention(s): Limited activity within patient's tolerance;Monitored during session;Repositioned    Home Living                      Prior Function            PT Goals (current goals can now be found in the care plan section) Acute Rehab PT Goals Patient Stated Goal: get stronger and home PT Goal Formulation: With patient/family Time For Goal Achievement:  03/11/21 Potential to Achieve Goals: Good Progress towards PT goals: Progressing toward goals    Frequency    Min 3X/week      PT Plan Current plan remains appropriate    Co-evaluation              AM-PAC PT "6 Clicks" Mobility   Outcome Measure  Help needed turning from your back to your side while in a flat bed without using bedrails?: None Help needed moving from lying on your back to sitting on the side of a flat bed without using bedrails?: A Little Help needed moving to and from a bed to a chair (including a wheelchair)?: A Little Help needed standing up from a chair using your arms (e.g., wheelchair or bedside chair)?: A Little Help needed to walk in hospital room?: A Lot Help needed climbing 3-5 steps with a railing? : A Lot 6 Click Score: 17    End of Session Equipment Utilized During Treatment: Gait belt Activity Tolerance: Patient tolerated treatment well;Patient limited by pain (nauseated by pain) Patient left: in bed;with call bell/phone within reach;with family/visitor present Nurse Communication: Mobility status PT Visit Diagnosis: Muscle weakness (generalized) (M62.81);Difficulty in walking, not elsewhere classified (R26.2);Unsteadiness on feet (R26.81);Pain Pain - part of body: Leg     Time: 1694-5038 PT Time Calculation (min) (ACUTE ONLY): 24 min  Charges:  $Therapeutic Exercise: 8-22 mins $Therapeutic Activity: 8-22 mins                     Verner Mould, DPT Acute Rehabilitation Services Office [phone] Pager [phone]     Jacques Navy 03/05/2021, 2:47 PM

## 2021-03-05 NOTE — Progress Notes (Incomplete)
BLE venous duplex has been completed.  Results can be found under chart review under CV PROC. 03/05/2021 11:47 AM Crysten Kaman RVT, RDMS

## 2021-03-06 DIAGNOSIS — Z515 Encounter for palliative care: Secondary | ICD-10-CM | POA: Diagnosis not present

## 2021-03-06 DIAGNOSIS — R52 Pain, unspecified: Secondary | ICD-10-CM | POA: Diagnosis not present

## 2021-03-06 DIAGNOSIS — C419 Malignant neoplasm of bone and articular cartilage, unspecified: Secondary | ICD-10-CM | POA: Diagnosis not present

## 2021-03-06 DIAGNOSIS — N179 Acute kidney failure, unspecified: Secondary | ICD-10-CM | POA: Diagnosis not present

## 2021-03-06 DIAGNOSIS — C9000 Multiple myeloma not having achieved remission: Secondary | ICD-10-CM | POA: Diagnosis not present

## 2021-03-06 DIAGNOSIS — E876 Hypokalemia: Secondary | ICD-10-CM | POA: Diagnosis not present

## 2021-03-06 LAB — CBC WITH DIFFERENTIAL/PLATELET
Abs Immature Granulocytes: 0.03 10*3/uL (ref 0.00–0.07)
Basophils Absolute: 0 10*3/uL (ref 0.0–0.1)
Basophils Relative: 1 %
Eosinophils Absolute: 0.1 10*3/uL (ref 0.0–0.5)
Eosinophils Relative: 3 %
HCT: 30.4 % — ABNORMAL LOW (ref 39.0–52.0)
Hemoglobin: 10.1 g/dL — ABNORMAL LOW (ref 13.0–17.0)
Immature Granulocytes: 1 %
Lymphocytes Relative: 15 %
Lymphs Abs: 0.6 10*3/uL — ABNORMAL LOW (ref 0.7–4.0)
MCH: 31.7 pg (ref 26.0–34.0)
MCHC: 33.2 g/dL (ref 30.0–36.0)
MCV: 95.3 fL (ref 80.0–100.0)
Monocytes Absolute: 0.5 10*3/uL (ref 0.1–1.0)
Monocytes Relative: 13 %
Neutro Abs: 2.7 10*3/uL (ref 1.7–7.7)
Neutrophils Relative %: 67 %
Platelets: 160 10*3/uL (ref 150–400)
RBC: 3.19 MIL/uL — ABNORMAL LOW (ref 4.22–5.81)
RDW: 15.2 % (ref 11.5–15.5)
WBC: 4 10*3/uL (ref 4.0–10.5)
nRBC: 0 % (ref 0.0–0.2)

## 2021-03-06 LAB — COMPREHENSIVE METABOLIC PANEL
ALT: 8 U/L (ref 0–44)
AST: 12 U/L — ABNORMAL LOW (ref 15–41)
Albumin: 2.1 g/dL — ABNORMAL LOW (ref 3.5–5.0)
Alkaline Phosphatase: 67 U/L (ref 38–126)
Anion gap: 5 (ref 5–15)
BUN: 8 mg/dL (ref 8–23)
CO2: 18 mmol/L — ABNORMAL LOW (ref 22–32)
Calcium: 7.4 mg/dL — ABNORMAL LOW (ref 8.9–10.3)
Chloride: 115 mmol/L — ABNORMAL HIGH (ref 98–111)
Creatinine, Ser: 0.4 mg/dL — ABNORMAL LOW (ref 0.61–1.24)
GFR, Estimated: >60 mL/min (ref >60)
Glucose, Bld: 105 mg/dL — ABNORMAL HIGH (ref 70–99)
Potassium: 3.9 mmol/L (ref 3.5–5.1)
Sodium: 138 mmol/L (ref 135–145)
Total Bilirubin: 0.2 mg/dL — ABNORMAL LOW (ref 0.3–1.2)
Total Protein: 4 g/dL — ABNORMAL LOW (ref 6.5–8.1)

## 2021-03-06 LAB — GLUCOSE, CAPILLARY
Glucose-Capillary: 117 mg/dL — ABNORMAL HIGH (ref 70–99)
Glucose-Capillary: 109 mg/dL — ABNORMAL HIGH (ref 70–99)
Glucose-Capillary: 103 mg/dL — ABNORMAL HIGH (ref 70–99)
Glucose-Capillary: 115 mg/dL — ABNORMAL HIGH (ref 70–99)
Glucose-Capillary: 105 mg/dL — ABNORMAL HIGH (ref 70–99)
Glucose-Capillary: 96 mg/dL (ref 70–99)

## 2021-03-06 LAB — MAGNESIUM: Magnesium: 1.9 mg/dL (ref 1.7–2.4)

## 2021-03-06 LAB — PHOSPHORUS: Phosphorus: 2.3 mg/dL — ABNORMAL LOW (ref 2.5–4.6)

## 2021-03-06 MED ORDER — POTASSIUM PHOSPHATES 15 MMOLE/5ML IV SOLN
20.0000 mmol | Freq: Once | INTRAVENOUS | Status: AC
  Administered 2021-03-06: 20 mmol via INTRAVENOUS
  Filled 2021-03-06: qty 6.67

## 2021-03-06 MED ORDER — PANTOPRAZOLE SODIUM 40 MG PO TBEC
40.0000 mg | DELAYED_RELEASE_TABLET | Freq: Two times a day (BID) | ORAL | Status: DC
  Administered 2021-03-06 – 2021-03-10 (×9): 40 mg via ORAL
  Filled 2021-03-06 (×9): qty 1

## 2021-03-06 MED ORDER — TRAVASOL 10 % IV SOLN
INTRAVENOUS | Status: AC
  Filled 2021-03-06: qty 528

## 2021-03-06 MED ORDER — ACETAMINOPHEN 10 MG/ML IV SOLN
1000.0000 mg | Freq: Four times a day (QID) | INTRAVENOUS | Status: AC
  Administered 2021-03-06 – 2021-03-07 (×4): 1000 mg via INTRAVENOUS
  Filled 2021-03-06 (×4): qty 100

## 2021-03-06 MED ORDER — ACYCLOVIR 200 MG PO CAPS
400.0000 mg | ORAL_CAPSULE | Freq: Two times a day (BID) | ORAL | Status: DC
Start: 2021-03-06 — End: 2021-03-10
  Administered 2021-03-06 – 2021-03-10 (×9): 400 mg via ORAL
  Filled 2021-03-06 (×9): qty 2

## 2021-03-06 MED ORDER — SODIUM CHLORIDE 0.9 % IV SOLN
4.0000 g | Freq: Once | INTRAVENOUS | Status: AC
  Administered 2021-03-06: 4 g via INTRAVENOUS
  Filled 2021-03-06: qty 40

## 2021-03-06 NOTE — Progress Notes (Signed)
[name] is making slow but steady progress.  He is back to his normal mental state.  Again, I had to believe that the narcotic medications that were used are probably the source of his confusion.  He is still having the leg pain.  He is on IV Tylenol and IV ketorolac.  These seem to be helping a little bit.  He is still on TNA.  He is starting to eat a little bit more.  I probably would keep him on the TNA over the weekend just to make sure that he gets some nutrition.  His calcium is still on the low side.  I will going give him some calcium.  He has had some loose stools but no frank diarrhea.  He is not complaining of any kind of nausea.  His CBC shows white cell count of 4.  Hemoglobin 10.1 and platelet count 260,000.  His albumin is still low at 2.1.  I know that pharmacy is doing a good job try to manage most of the electrolytes.  I very much appreciate their help.  I do not know how much or how much physical therapy is able to do right now.  I appreciate everybody's help with his physical therapy and Occupational Therapy.  All of his vital signs look good.  Temperature 98.7.  Pulse 81.  Blood pressure 146/81.  His oral exam shows no thrush.  Lungs sound clear bilaterally.  Cardiac exam regular rate and rhythm.  Abdomen is soft.  Bowel sounds are present.  There is no guarding or rebound tenderness.  Extremities shows no swelling in the legs.  He has good range of motion of his joints.  He has some tenderness over the long bones to palpation.  Neurological exam shows no focal deficits.  Mr. [name] still needs a lot of nutritional support.  Hopefully, now that his mental status is back to baseline, he will be able to eat a little bit more.  I still want the TNA to be given over the weekend.  I very much appreciate all of the great care that he is getting from the staff up on 6 E.  Lattie Haw, MD  Psalm 118:5

## 2021-03-06 NOTE — Progress Notes (Signed)
PHARMACY - TOTAL PARENTERAL NUTRITION CONSULT NOTE   Indication: confusion, no plans for entereal feeding per MD  Patient Measurements:     There is no height or weight on file to calculate BMI. 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 He is s/p stem cell transplant at [facility] on 01/22/2021.  He has had persistent nausea and vomiting with eating, admitted for failure to thrive and management of his symptoms. 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    Pharmacy was consulted to start TPN as patient is more confused and unable to take PO nutrition.  MD does not want to place a feeding tube.  Glucose / Insulin: No hx DM. CBGs continue to be at goal < 150 - no SSI required. Electrolytes:  - K 3.9  - Phos slightly low still at 2.3 - plan repeat Kphos IV today - Mg now wnl (1.9) - Corrected Ca at low end normal (8.9) - Md to order 4g CaGluc - Cl elevated/CO2 low. Renal: BUN/SCr low, UOP adequate. Hepatic: AST low, others WNL. Alb low 2.2 Prealbumin: 5.6 (5/6) Triglycerides: WNL (5/6) Intake / Output; MIVF: NS 50 ml/hr. I/O +946 last 24hr, -5L since admit. GI Imaging: GI Surgeries / Procedures:   Central access: PICC already in place TPN start date: 5/5  Nutritional Goals (per RD recommendation on 5/4): Kcal:  2100-2300, Protein:  105-115g, Fluid:  2.3L/day Goal TPN rate is 80 mL/hr (provides 105 g of protein and 2016 kcals per day)  Current Nutrition:  Regular diet but too confused to eat  Plan:  Now: Kphos 46mMol IV x 1  Continue TPN at 40 mL/hr due to risk of refeeding syndrome  This will provide 53g protein, 1008kcal Electrolytes in TPN:   Na 33mEq/L   K 44mEq/L  Ca 5mEq/L  Mg 24mEq/L  Phos 58mmol/L   Max Acetate Add standard MVI and trace elements to  TPN Continue Sensitive q8h SSI and adjust as needed  Continue MIVF at 50 mL/hr Monitor TPN labs on Mon/Thurs, full panel in AM   Adrian Saran, PharmD, BCPS Secure Chat if ?s 03/06/2021 8:24 AM

## 2021-03-06 NOTE — Plan of Care (Signed)
  Problem: Clinical Measurements: Goal: Will remain free from infection Outcome: Progressing   Problem: Clinical Measurements: Goal: Respiratory complications will improve Outcome: Progressing   Problem: Coping: Goal: Level of anxiety will decrease Outcome: Progressing   Problem: Elimination: Goal: Will not experience complications related to bowel motility Outcome: Progressing   Problem: Elimination: Goal: Will not experience complications related to urinary retention Outcome: Progressing   Problem: Safety: Goal: Ability to remain free from injury will improve Outcome: Progressing

## 2021-03-07 DIAGNOSIS — E876 Hypokalemia: Secondary | ICD-10-CM | POA: Diagnosis not present

## 2021-03-07 DIAGNOSIS — C9000 Multiple myeloma not having achieved remission: Secondary | ICD-10-CM | POA: Diagnosis not present

## 2021-03-07 LAB — CBC WITH DIFFERENTIAL/PLATELET
Abs Immature Granulocytes: 0.05 10*3/uL (ref 0.00–0.07)
Basophils Absolute: 0 10*3/uL (ref 0.0–0.1)
Basophils Relative: 0 %
Eosinophils Absolute: 0.1 10*3/uL (ref 0.0–0.5)
Eosinophils Relative: 3 %
HCT: 32 % — ABNORMAL LOW (ref 39.0–52.0)
Hemoglobin: 10.7 g/dL — ABNORMAL LOW (ref 13.0–17.0)
Immature Granulocytes: 1 %
Lymphocytes Relative: 17 %
Lymphs Abs: 0.9 10*3/uL (ref 0.7–4.0)
MCH: 31.5 pg (ref 26.0–34.0)
MCHC: 33.4 g/dL (ref 30.0–36.0)
MCV: 94.1 fL (ref 80.0–100.0)
Monocytes Absolute: 0.6 10*3/uL (ref 0.1–1.0)
Monocytes Relative: 12 %
Neutro Abs: 3.6 10*3/uL (ref 1.7–7.7)
Neutrophils Relative %: 67 %
Platelets: 191 10*3/uL (ref 150–400)
RBC: 3.4 MIL/uL — ABNORMAL LOW (ref 4.22–5.81)
RDW: 15.4 % (ref 11.5–15.5)
WBC: 5.3 10*3/uL (ref 4.0–10.5)
nRBC: 0 % (ref 0.0–0.2)

## 2021-03-07 LAB — COMPREHENSIVE METABOLIC PANEL
ALT: 12 U/L (ref 0–44)
AST: 15 U/L (ref 15–41)
Albumin: 2.2 g/dL — ABNORMAL LOW (ref 3.5–5.0)
Alkaline Phosphatase: 68 U/L (ref 38–126)
Anion gap: 4 — ABNORMAL LOW (ref 5–15)
BUN: 10 mg/dL (ref 8–23)
CO2: 20 mmol/L — ABNORMAL LOW (ref 22–32)
Calcium: 7.7 mg/dL — ABNORMAL LOW (ref 8.9–10.3)
Chloride: 113 mmol/L — ABNORMAL HIGH (ref 98–111)
Creatinine, Ser: 0.43 mg/dL — ABNORMAL LOW (ref 0.61–1.24)
GFR, Estimated: >60 mL/min (ref >60)
Glucose, Bld: 103 mg/dL — ABNORMAL HIGH (ref 70–99)
Potassium: 4 mmol/L (ref 3.5–5.1)
Sodium: 137 mmol/L (ref 135–145)
Total Bilirubin: 0.1 mg/dL — ABNORMAL LOW (ref 0.3–1.2)
Total Protein: 4.2 g/dL — ABNORMAL LOW (ref 6.5–8.1)

## 2021-03-07 LAB — HEMOGLOBIN A1C
Hgb A1c MFr Bld: 5.1 % (ref 4.8–5.6)
Mean Plasma Glucose: 99.67 mg/dL

## 2021-03-07 LAB — GLUCOSE, CAPILLARY
Glucose-Capillary: 97 mg/dL (ref 70–99)
Glucose-Capillary: 116 mg/dL — ABNORMAL HIGH (ref 70–99)
Glucose-Capillary: 116 mg/dL — ABNORMAL HIGH (ref 70–99)
Glucose-Capillary: 106 mg/dL — ABNORMAL HIGH (ref 70–99)

## 2021-03-07 LAB — PHOSPHORUS: Phosphorus: 3 mg/dL (ref 2.5–4.6)

## 2021-03-07 LAB — MAGNESIUM: Magnesium: 1.7 mg/dL (ref 1.7–2.4)

## 2021-03-07 MED ORDER — ACETAMINOPHEN 500 MG PO TABS
1000.0000 mg | ORAL_TABLET | Freq: Four times a day (QID) | ORAL | Status: DC | PRN
  Administered 2021-03-07 – 2021-03-09 (×5): 1000 mg via ORAL
  Filled 2021-03-07 (×5): qty 2

## 2021-03-07 MED ORDER — TRAVASOL 10 % IV SOLN
INTRAVENOUS | Status: DC
  Filled 2021-03-07: qty 528

## 2021-03-07 MED ORDER — INSULIN ASPART 100 UNIT/ML IJ SOLN: 0.0000 [IU] | Freq: Three times a day (TID) | INTRAMUSCULAR | Status: DC

## 2021-03-07 MED ORDER — LOPERAMIDE HCL 2 MG PO CAPS
2.0000 mg | ORAL_CAPSULE | ORAL | Status: DC | PRN
  Administered 2021-03-07: 2 mg via ORAL
  Filled 2021-03-07: qty 1

## 2021-03-07 MED ORDER — INSULIN ASPART 100 UNIT/ML IJ SOLN: 0.0000 [IU] | Freq: Every day | INTRAMUSCULAR | Status: DC

## 2021-03-07 NOTE — Progress Notes (Signed)
Harun Brumley   DOB:07/23/1951   ER#:[id number]   EHU#:[id number]  Subjective:   Mr Durkin was in bed but alert, feeling well He was just hoping to get some diarrhea, he says this is a chronic complaint, no new fever, abdominal pain. He has some baseline abdominal pain, no change. He wants to eat, didn't like the taste of the food which was offered for breakfast No nausea, vomiting.  Objective:  Vitals:   03/07/21 0611 03/07/21 1146  BP: 114/63 (!) 145/85  Pulse: 76 79  Resp: 16 16  Temp: 98.2 F (36.8 C) 98.2 F (36.8 C)  SpO2: 99% 99%    There is no height or weight on file to calculate BMI.  Intake/Output Summary (Last 24 hours) at 03/07/2021 1155 Last data filed at 03/07/2021 1020 Gross per 24 hour  Intake 1963.78 ml  Output --  Net 1963.78 ml     Sclerae unicteric  Oropharynx clear  No peripheral adenopathy  Lungs clear -- no rales or rhonchi  Heart regular rate and rhythm  Abdomen benign, no guarding, rigidity or rebound tenderness  MSK no focal spinal tenderness, no peripheral edema  Neuro nonfocal    CBG (last 3)  Recent Labs    03/06/21 1624 03/06/21 2112 03/07/21 0637  GLUCAP 105* 96 97     Labs:  Lab Results  Component Value Date   WBC 5.3 03/07/2021   HGB 10.7 (L) 03/07/2021   HCT 32.0 (L) 03/07/2021   MCV 94.1 03/07/2021   PLT 191 03/07/2021   NEUTROABS 3.6 03/07/2021    @LASTCHEMISTRY @  Urine Studies No results for input(s): UHGB, CRYS in the last 72 hours.  Invalid input(s): UACOL, UAPR, USPG, UPH, UTP, UGL, UKET, UBIL, UNIT, UROB, [place], UEPI, [place], [place]  Basic Metabolic Panel: Recent Labs  Lab 03/03/21 0536 03/04/21 0531 03/05/21 0540 03/05/21 1230 03/05/21 1354 03/06/21 0500 03/07/21 0540  NA 133* 141 138 119* 135 138 137  K 3.1* 3.6 3.3* 3.4* 4.0 3.9 4.0  CL 105 112* 112* 96* 112* 115* 113*  CO2 22 21* 21* 17* 20* 18* 20*  GLUCOSE 95 95 112* 648* 108* 105* 103*  BUN <5* <5* 7* 7* 9 8 10   CREATININE  0.36* 0.36* 0.35* 0.45* 0.43* 0.40* 0.43*  CALCIUM 7.1* 7.9* 7.4* 6.4* 7.3* 7.4* 7.7*  MG 1.6* 1.7 2.0  --   --  1.9 1.7  PHOS  --  1.5* 2.4*  --   --  2.3* 3.0   GFR CrCl cannot be calculated (Unknown ideal weight.). Liver Function Tests: Recent Labs  Lab 03/05/21 0540 03/05/21 1230 03/05/21 1354 03/06/21 0500 03/07/21 0540  AST 11* 12* 12* 12* 15  ALT 9 8 9 8 12   ALKPHOS 65 60 64 67 68  BILITOT 0.3 0.4 0.2* 0.2* 0.1*  PROT 4.1* 3.7* 3.9* 4.0* 4.2*  ALBUMIN 2.2* 1.9* 2.2* 2.1* 2.2*   No results for input(s): LIPASE, AMYLASE in the last 168 hours. No results for input(s): AMMONIA in the last 168 hours. Coagulation profile No results for input(s): INR, PROTIME in the last 168 hours.  CBC: Recent Labs  Lab 03/03/21 0536 03/04/21 0531 03/05/21 0540 03/06/21 0500 03/07/21 0540  WBC 6.6 6.4 4.5 4.0 5.3  NEUTROABS 5.1 5.0 3.2 2.7 3.6  HGB 9.9* 9.9* 9.8* 10.1* 10.7*  HCT 29.3* 29.6* 29.4* 30.4* 32.0*  MCV 93.3 93.7 94.2 95.3 94.1  PLT 132* 142* 145* 160 191   Cardiac Enzymes: No results for input(s):  CKTOTAL, CKMB, CKMBINDEX, TROPONINI in the last 168 hours. BNP: Invalid input(s): POCBNP CBG: Recent Labs  Lab 03/06/21 0737 03/06/21 1146 03/06/21 1624 03/06/21 2112 03/07/21 0637  GLUCAP 103* 115* 105* 96 97   D-Dimer No results for input(s): DDIMER in the last 72 hours. Hgb A1c No results for input(s): HGBA1C in the last 72 hours. Lipid Profile Recent Labs    03/05/21 0540  TRIG 90   Thyroid function studies No results for input(s): TSH, T4TOTAL, T3FREE, THYROIDAB in the last 72 hours.  Invalid input(s): FREET3 Anemia work up No results for input(s): VITAMINB12, FOLATE, FERRITIN, TIBC, IRON, RETICCTPCT in the last 72 hours. Microbiology Recent Results (from the past 240 hour(s))  Culture, blood (Routine X 2) w Reflex to ID Panel     Status: None (Preliminary result)   Collection Time: 03/04/21 10:45 AM   Specimen: BLOOD  Result Value Ref Range  Status   Specimen Description   Final    BLOOD RIGHT ANTECUBITAL Performed at [facility], [place] [address], [place], [place] [address]    Special Requests   Final    BOTTLES DRAWN AEROBIC ONLY Blood Culture adequate volume Performed at [place] [address], [place], [place] [address]    Culture   Final    NO GROWTH 3 DAYS Performed at [facility], [place] [address] [place], [place] [address]    Report Status PENDING  Incomplete  Culture, blood (Routine X 2) w Reflex to ID Panel     Status: None (Preliminary result)   Collection Time: 03/04/21 10:45 AM   Specimen: BLOOD RIGHT HAND  Result Value Ref Range Status   Specimen Description   Final    BLOOD RIGHT HAND Performed at [place] [address], [place], [place] [address]    Special Requests   Final    BOTTLES DRAWN AEROBIC ONLY Blood Culture adequate volume Performed at [place] [address], [place], [place] [address]    Culture   Final    NO GROWTH 3 DAYS Performed at [facility], [place] [address], [place], [place] [address]    Report Status PENDING  Incomplete  Urine Culture     Status: None (Preliminary result)   Collection Time: 03/05/21  5:00 PM   Specimen: Urine, Catheterized  Result Value Ref Range Status   Specimen Description   Final    URINE, CATHETERIZED Performed at [place] [address] [place], [place] [address]    Special Requests   Final    Immunocompromised Performed at [facility], [place] [address], [place], [place] [address]    Culture   Final    CULTURE REINCUBATED FOR BETTER GROWTH Performed at [facility], [place] [address], [place], [place] [address]    Report Status PENDING  Incomplete   Studies:  No results found.  Assessment/PLAN  70 yo male with kappa light chain myeloma-extensive bone disease-pathologic fracture of left scapula and left  femur.He is s/p stem cell transplant at [facility] on 01/22/2021. He has had persistent nausea and vomiting with eating, admitted for failure to thrive and management of his symptoms. He is currently on TPN per Dr Antonieta Pert request. He will continue this through the weekend. He is feeling hungry and is slowly increasing his PO intake. He would like to take some imodium for diarrhea. No evidence of C diff colitis. No abdominal tenderness, mild pain  which he has at baseline, no guarding, rigidity, fever or leukocytosis. Ok to try imodium, wife says he has been tested for C diff in the past and was negative and his diarrhea is something he has most of the time. Electrolytes appear corrected, corrected Ca over 9, no indication for supplementation. TPN plan per Dr [name],  We will monitor him while admitted.  Dr [name] will take over plan of care tomorrow.  Benay Pike, MD 03/07/2021  11:55 AM

## 2021-03-07 NOTE — Progress Notes (Signed)
Occupational Therapy Treatment Patient Details Name: Bruce Smith MRN: [id number] DOB: February 09, 1951 Today's Date: 03/07/2021    History of present illness Patient is a 70 year old male with PMH significant for esophageal stricture and myeloma-extensive bone disease-pathologic fracture of left scapula and left femur. Pt recently had ASCT at [facility] on 01/22/2021.  Patient admitted due to weakness and inability to stand at doctor's office. Patient also with persistent nausea and vomiting with eating. S/p EGD 4/27   OT comments  Treatment focused on exercises for upper body strengthening. 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 Patient tolerated exercises well in seated position but did begin to report shoulder fatigue. Patient ambulated in room with RW with min guard. Patient reports feeling weak and wobbly. Theraband provided and positioned on bed rails and written HEP provided.Therapist discussed patient's plan for CIR and needing to advance activity tolerance - working towards 30 minutes of activity on next treatment. Patient and family agreeable. Cont POC.     Follow Up Recommendations  CIR    Equipment Recommendations  Other (comment)    Recommendations for Other Services      Precautions / Restrictions Precautions Precautions: Fall Precaution Comments: hx falls resulting in scapula, hip and tib fracture within past 4 months Restrictions Weight Bearing Restrictions: No       Mobility Bed Mobility Overal bed mobility: Needs Assistance Bed Mobility: Supine to Sit;Sit to Supine     Supine to sit: Supervision Sit to supine: Supervision   General bed mobility comments: Supervision for bed transfers. Patient able to pull himself up in bed using head board.    Transfers Overall transfer level: Needs assistance Equipment used: Rolling walker (2 wheeled) Transfers: Sit to/from  Stand Sit to Stand: Min guard         General transfer comment: Min guard to stand from bed and recliner. Ambulated in room x 1 loop with RW. Reports feeling weak and woblly but not dizzy.    Balance Overall balance assessment: Mild deficits observed, not formally tested                                         ADL either performed or assessed with clinical judgement   ADL                                               Vision Patient Visual Report: No change from baseline     Perception     Praxis      Cognition Arousal/Alertness: Awake/alert Behavior During Therapy: WFL for tasks assessed/performed Overall Cognitive Status: Within Functional Limits for tasks assessed                                          Exercises Other Exercises Other Exercises: Yellow TB exercises: shoulder diagonals, shoulder flexion, elbow extension, bicep curls, shoulder abduction (pull aparts).   Shoulder Instructions       General Comments      Pertinent Vitals/ Pain       Pain Assessment: No/denies pain  Home Living  Prior Functioning/Environment              Frequency  Min 2X/week        Progress Toward Goals  OT Goals(current goals can now be found in the care plan section)  Progress towards OT goals: Progressing toward goals  Acute Rehab OT Goals Patient Stated Goal: get stronger and home OT Goal Formulation: With patient/family Time For Goal Achievement: 03/10/21 Potential to Achieve Goals: Good  Plan Discharge plan needs to be updated    Co-evaluation                 AM-PAC OT "6 Clicks" Daily Activity     Outcome Measure   Help from another person eating meals?: None Help from another person taking care of personal grooming?: A Little Help from another person toileting, which includes using toliet, bedpan, or urinal?: A Lot Help from  another person bathing (including washing, rinsing, drying)?: A Lot Help from another person to put on and taking off regular upper body clothing?: A Little Help from another person to put on and taking off regular lower body clothing?: A Lot 6 Click Score: 16    End of Session Equipment Utilized During Treatment: Rolling walker  OT Visit Diagnosis: Muscle weakness (generalized) (M62.81);History of falling (Z91.81);Unsteadiness on feet (R26.81)   Activity Tolerance Patient limited by fatigue   Patient Left in bed;with call bell/phone within reach;with family/visitor present   Nurse Communication Mobility status        Time: 1415-1434 OT Time Calculation (min): 19 min  Charges: OT General Charges $OT Visit: 1 Visit OT Treatments $Therapeutic Exercise: 8-22 mins  Derl Barrow, OTR/L [place]  Office [phone] Pager: [place] 03/07/2021, 3:49 PM

## 2021-03-07 NOTE — Progress Notes (Signed)
Palliative care progress note  Reason for visit: Cancer-related pain, complicated by worsening confusion  I saw and examined Mr. Oleson today.  His wife is at the bedside.  Thankfully, his confusion has resolved with discontinuation of narcotics and Levsin.  We discussed pain management moving forward.  He is currently on a regimen of Tylenol, Toradol, and Lyrica.  He reports that he still has pain in his lower extremity, but he was able to work better with physical therapy today despite the pain as his mental status is now back to baseline.  His wife asked me my opinion on plan for transition to CIR for rehab.  I expressed that I trust our PT/OT colleagues to help determine how to maximize somebody's functional status as much as possible and if they recommend it, a short term of focused rehab at [facility] would likely benefit him.  We talked again about how nutrition, cognition, and functional status are good indicators of how someone is doing and anything we can do to maximize these will likely benefit him in light of his cancer and recent bone marrow transplant.  We also discussed that rehab at CIR would be more intense than he would receive at a skilled nursing facility or at home through home health.  They will continue to consider and discuss further with Dr. [name].  -Would continue to emphasize delirium prevention interventions.  I agree that cause of his delirium is likely from narcotic pain medications as well as possibly from the anticholinergic effects of Levsin.  I would recommend continuing to focus on adjuvant pain medications rather than narcotics as Dr. [name] has been doing.  Standard delirium management (adapted from NICE guidelines 2011 for prevention of delirium):  Provide continuity of care when possible (avoid frequent changing of surroundings and staff).  Frequent reorientation to time with:  A clock should always be visible.  Make sure Calendar/white board is updated.  Lights  on/blinds open during the day and off/closed at night.  Encourage frequent family visits.  Monitor for and treat dehydration/constipation.  Optimize oxygen saturation.  Avoid catheters and IV's when possible and look for/treat infections.  Encourage early mobility.  Assess and treat pain.  Ensure adequate nutrition and functioning dentures.  Address reversible causes of hearing and visual impairment:  Use pocket talker if hearing aids are unavailable.  Avoid sleep disturbance (normalize sleep/wake cycle).  Minimize disturbances and consider NOT obtaining vitals at night if possible.  Review Medications to avoid polypharmacy and avoid deliriogenic medications when possible:  Benzodiazepines.  Dihydropyridines.  Antihistamines.  Anticholinergics.  (Possibly avoid: H2 blockers, tricyclic antidepressants, antiparkinson medications, steroids, NSAID's).   -No other palliative specific recommendations at this point.  Please call if we can be of further assistance in the care of Mr. Whittley moving forward.  Total time: 20 minutes  Greater than 50%  of this time was spent counseling and coordinating care related to the above assessment and plan.  Micheline Rough, MD [place] Team [phone]

## 2021-03-07 NOTE — Progress Notes (Signed)
PHARMACY - TOTAL PARENTERAL NUTRITION CONSULT NOTE   Indication: confusion, no plans for entereal feeding per MD  Patient Measurements:     There is no height or weight on file to calculate BMI. 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 He is s/p stem cell transplant at [facility] on 01/22/2021.  He has had persistent nausea and vomiting with eating, admitted for failure to thrive and management of his symptoms. 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    Pharmacy was consulted to start TPN as patient is more confused and unable to take PO nutrition.  MD does not want to place a feeding tube.  Glucose / Insulin: No hx DM. CBGs continue to be at goal < 150 - no SSI required. Electrolytes:  - K 4 - Phos now WNL at 3 - Mg now wnl (1.7) - Corrected Ca now WNL (9.1) - Cl elevated/CO2 low. Renal: BUN/SCr low, UOP adequate. Hepatic: AST low, others WNL. Alb low 2.2 Prealbumin: 5.6 (5/6) Triglycerides: WNL (5/6) Intake / Output; MIVF: I/O +1384 last 24hr, -2764mL since admit. GI Imaging: GI Surgeries / Procedures:   Central access: PICC already in place TPN start date: 5/5  Nutritional Goals (per RD recommendation on 5/4): Kcal:  2100-2300, Protein:  105-115g, Fluid:  2.3L/day Goal TPN rate is 80 mL/hr (provides 105 g of protein and 2016 kcals per day)  Current Nutrition:  Regular diet - 75% of at least 1 meal charted on 5/7  Plan:  Continue TPN at 40 mL/hr due to risk of refeeding syndrome and fact that patient may be starting to eat again  This will provide 53g protein, 1008kcal Electrolytes in TPN:   Na 67mEq/L   K 46mEq/L  Ca 38mEq/L  Mg 32mEq/L  Phos 69mmol/L   Max Acetate Add standard MVI and trace elements to TPN Continue Sensitive SSI and adjust as needed but will  change to with meals coverage Monitor TPN labs on Mon/Thurs, full panel in AM   Adrian Saran, PharmD, BCPS Secure Chat if ?s 03/07/2021 10:31 AM

## 2021-03-08 DIAGNOSIS — C9000 Multiple myeloma not having achieved remission: Secondary | ICD-10-CM | POA: Diagnosis not present

## 2021-03-08 DIAGNOSIS — R110 Nausea: Secondary | ICD-10-CM

## 2021-03-08 DIAGNOSIS — Z792 Long term (current) use of antibiotics: Secondary | ICD-10-CM

## 2021-03-08 DIAGNOSIS — E876 Hypokalemia: Secondary | ICD-10-CM | POA: Diagnosis not present

## 2021-03-08 LAB — GLUCOSE, CAPILLARY
Glucose-Capillary: 116 mg/dL — ABNORMAL HIGH (ref 70–99)
Glucose-Capillary: 103 mg/dL — ABNORMAL HIGH (ref 70–99)
Glucose-Capillary: 90 mg/dL (ref 70–99)
Glucose-Capillary: 91 mg/dL (ref 70–99)

## 2021-03-08 LAB — CBC WITH DIFFERENTIAL/PLATELET
Abs Immature Granulocytes: 0.06 10*3/uL (ref 0.00–0.07)
Basophils Absolute: 0 10*3/uL (ref 0.0–0.1)
Basophils Relative: 0 %
Eosinophils Absolute: 0.1 10*3/uL (ref 0.0–0.5)
Eosinophils Relative: 2 %
HCT: 30.3 % — ABNORMAL LOW (ref 39.0–52.0)
Hemoglobin: 10 g/dL — ABNORMAL LOW (ref 13.0–17.0)
Immature Granulocytes: 1 %
Lymphocytes Relative: 17 %
Lymphs Abs: 0.9 10*3/uL (ref 0.7–4.0)
MCH: 31.1 pg (ref 26.0–34.0)
MCHC: 33 g/dL (ref 30.0–36.0)
MCV: 94.1 fL (ref 80.0–100.0)
Monocytes Absolute: 0.7 10*3/uL (ref 0.1–1.0)
Monocytes Relative: 12 %
Neutro Abs: 3.7 10*3/uL (ref 1.7–7.7)
Neutrophils Relative %: 68 %
Platelets: 180 10*3/uL (ref 150–400)
RBC: 3.22 MIL/uL — ABNORMAL LOW (ref 4.22–5.81)
RDW: 15.5 % (ref 11.5–15.5)
WBC: 5.5 10*3/uL (ref 4.0–10.5)
nRBC: 0 % (ref 0.0–0.2)

## 2021-03-08 LAB — COMPREHENSIVE METABOLIC PANEL
ALT: 11 U/L (ref 0–44)
AST: 14 U/L — ABNORMAL LOW (ref 15–41)
Albumin: 2.2 g/dL — ABNORMAL LOW (ref 3.5–5.0)
Alkaline Phosphatase: 68 U/L (ref 38–126)
Anion gap: 4 — ABNORMAL LOW (ref 5–15)
BUN: 11 mg/dL (ref 8–23)
CO2: 22 mmol/L (ref 22–32)
Calcium: 7.4 mg/dL — ABNORMAL LOW (ref 8.9–10.3)
Chloride: 110 mmol/L (ref 98–111)
Creatinine, Ser: 0.44 mg/dL — ABNORMAL LOW (ref 0.61–1.24)
GFR, Estimated: >60 mL/min (ref >60)
Glucose, Bld: 107 mg/dL — ABNORMAL HIGH (ref 70–99)
Potassium: 3.8 mmol/L (ref 3.5–5.1)
Sodium: 136 mmol/L (ref 135–145)
Total Bilirubin: 0.2 mg/dL — ABNORMAL LOW (ref 0.3–1.2)
Total Protein: 3.8 g/dL — ABNORMAL LOW (ref 6.5–8.1)

## 2021-03-08 LAB — URINE CULTURE: Culture: 40000 — AB

## 2021-03-08 LAB — TRIGLYCERIDES: Triglycerides: 111 mg/dL (ref <150)

## 2021-03-08 LAB — MAGNESIUM: Magnesium: 1.6 mg/dL — ABNORMAL LOW (ref 1.7–2.4)

## 2021-03-08 LAB — PREALBUMIN: Prealbumin: 16.5 mg/dL — ABNORMAL LOW (ref 18–38)

## 2021-03-08 LAB — PHOSPHORUS: Phosphorus: 2.7 mg/dL (ref 2.5–4.6)

## 2021-03-08 MED ORDER — SODIUM CHLORIDE 0.9 % IV SOLN
1.0000 g | Freq: Four times a day (QID) | INTRAVENOUS | Status: DC
  Administered 2021-03-08 – 2021-03-10 (×8): 1 g via INTRAVENOUS
  Filled 2021-03-08 (×8): qty 1

## 2021-03-08 MED ORDER — MAGNESIUM SULFATE 2 GM/50ML IV SOLN
2.0000 g | Freq: Once | INTRAVENOUS | Status: AC
  Administered 2021-03-08: 2 g via INTRAVENOUS
  Filled 2021-03-08: qty 50

## 2021-03-08 MED ORDER — CALCIUM GLUCONATE 10 % IV SOLN
4.0000 g | Freq: Once | INTRAVENOUS | Status: AC
  Administered 2021-03-08: 4 g via INTRAVENOUS
  Filled 2021-03-08: qty 40

## 2021-03-08 MED ORDER — VANCOMYCIN HCL 1000 MG/200ML IV SOLN: 1000.0000 mg | Freq: Two times a day (BID) | INTRAVENOUS | Status: DC

## 2021-03-08 MED ORDER — MAGNESIUM SULFATE 2 GM/50ML IV SOLN: 2.0000 g | Freq: Once | INTRAVENOUS | Status: DC

## 2021-03-08 MED ORDER — VANCOMYCIN HCL 1500 MG/300ML IV SOLN
1500.0000 mg | Freq: Once | INTRAVENOUS | Status: DC
  Filled 2021-03-08: qty 300

## 2021-03-08 MED ORDER — CELECOXIB 200 MG PO CAPS
200.0000 mg | ORAL_CAPSULE | Freq: Two times a day (BID) | ORAL | Status: DC
  Administered 2021-03-08 – 2021-03-10 (×5): 200 mg via ORAL
  Filled 2021-03-08 (×5): qty 1

## 2021-03-08 NOTE — Progress Notes (Signed)
Mr. Schueller had a good weekend.  He is eating more.  We will stop the TNA now.  He is out of bed a little bit more.  Per the may been some loose bowels yesterday.  He is having some leg discomfort.  I will try him on some Celebrex.  We will see if this may help some with the discomfort.  We will stop the Toradol.  His nausea seems to be doing all right.  When he has nausea on occasion, we give him some Zofran.  He still has a urinary tract infection.  We checked his urine.  It is still positive for Enterococcus.  I will try him on vancomycin now.  He had been on ampicillin.  His potassium is good at 3.8.  His magnesium is 1.6.  His calcium is still on the low side at 7.4.  There is no cough.  His mental status baseline.  I heard there was some talk about maybe him going to [place].  I would have no problems with this.  His vital signs are all stable.  Temperature is 98.6.  Pulse 83.  Blood pressure 126/67.  His abdomen is soft.  He has good bowel sounds.  There is no fluid wave.  Lungs are clear.  Cardiac exam regular rate and rhythm.  Extremity shows no swelling.  There is still some tenderness over the long bones.  Neurological exam is nonfocal.  I really hope that he will continue to make progress.  We will now see how he does with nutritional intake.  It would be nice to see his albumin trending upward.  I know that he is getting great care from all the staff up on 6 [address].  I just would like to wish the nurses on 6 E. a Happy Nurses Week!!!!!  You all clearly make my life a lot better.  Lattie Haw, MD  Romans 8:28

## 2021-03-08 NOTE — Progress Notes (Signed)
Pharmacy Antibiotic Note  Bruce Smith is a 70 y.o. male admitted on 02/23/2021 with failure to thrive.  Pharmacy has been consulted for vancomcycin dosing for UTI.  He got 3 doses of IV ampicillin 5/1, 4 doses 5/2 & 1 dose 5/3. Ampicillin has time dependent killing. WBC WNL, AF, SCr WNL  Plan: Vancomycin 1500 mg IV loading dose Vancomycin 1000 mg IV q12 for est AUC 526 using SCr 0.8 and Vd 0.72 Consider changing to ampicillin IV or amoxicillin 500 mg PO TID for 5 day course    Temp (24hrs), Avg:98.5 F (36.9 C), Min:98.2 F (36.8 C), Max:98.7 F (37.1 C)  Recent Labs  Lab 03/04/21 0531 03/05/21 0540 03/05/21 1230 03/05/21 1354 03/06/21 0500 03/07/21 0540 03/08/21 0503  WBC 6.4 4.5  --   --  4.0 5.3 5.5  CREATININE 0.36* 0.35* 0.45* 0.43* 0.40* 0.43* 0.44*    CrCl cannot be calculated (Unknown ideal weight.).    No Known Allergies Antimicrobials this admission:  Resumed PTA Acyclovir 400mg  bid, >> Ampicillin 5/1 >> 5/3 5/9 Vanc>> Dose adjustments this admission:   Microbiology results:  4/26 U/A - trace WBC, few bacteria 4/26 MRSA neg 4/26 UCx: > 100K Enterococcus faecalis, pan-sens 5/6 UCx: 40 K enterococcus faecalis, pan-sens 5/5 BCx: ngtd  Thank you for allowing pharmacy to be a part of this patient's care.  Eudelia Bunch, Pharm.D 03/08/2021 8:07 AM

## 2021-03-08 NOTE — Progress Notes (Signed)
Physical Therapy Treatment Patient Details Name: Bruce Smith MRN: [id number] DOB: [birth year] Today's Date: 03/08/2021    History of Present Illness Patient is a 70 year old male with PMH significant for esophageal stricture and myeloma-extensive bone disease-pathologic fracture of left scapula and left femur. Pt recently had ASCT at [facility] on 01/22/2021.  Patient admitted due to weakness and inability to stand at doctor's office. Patient also with persistent nausea and vomiting with eating. S/p EGD 4/27    PT Comments    Patient making gradual progress in acute setting and increased gait distance today with multiple therapeutic seated rest breaks. Pt demonstrated improved balance with rise for sit<>stands and no significant posterior lean was observed after power up. Patient completed seated LE exercises with greatest difficulty during quad strengthening exercises. EOS returned to bed as pt had sat up for several hours this AM. He remains highly motivated to maximize functional independence and would benefit from intense follow up therapy at CIR level prior to discharge home. Acute PT will continue to progress pt as able.   Follow Up Recommendations  CIR     Equipment Recommendations  None recommended by PT    Recommendations for Other Services Rehab consult     Precautions / Restrictions Precautions Precautions: Fall Precaution Comments: hx falls resulting in scapula, hip and tib fracture within past 4 months Restrictions Weight Bearing Restrictions: No    Mobility  Bed Mobility Overal bed mobility: Needs Assistance Bed Mobility: Sit to Supine;Supine to Sit     Supine to sit: Supervision;HOB elevated Sit to supine: Min guard   General bed mobility comments: supervision/guarding for spine<>sit EOB, cues to avoid use of elevated HOB an dpt able to press trunk up with bil UE's.    Transfers Overall transfer level: Needs assistance Equipment used: Rolling walker (2  wheeled) Transfers: Sit to/from [facility] Sit to Stand: Min assist Stand pivot transfers: Min assist       General transfer comment: pt able to initiate power up with Bil UE's from EOB with min assist to steady during hadn transition to RW. pt with no posterior lean today. Stand step/pivot from recliner to EOB with min assist needed to steady, pt managed walker position without cues or assist.  Ambulation/Gait Ambulation/Gait assistance: Min assist;Min guard Gait Distance (Feet): 75 Feet (25, 20, 30) Assistive device: Rolling walker (2 wheeled) Gait Pattern/deviations: Step-through pattern;Decreased stride length;Shuffle Gait velocity: decr   General Gait Details: pt amb 3x short bouts of gait in hallway with seated rest for recovery 2/2 Lt>Rt LE pain. Patient required min guard on first bout and assist during 2nd/3rd bout due to fatigue and knees/hips more flexed.   Stairs             Wheelchair Mobility    Modified Rankin (Stroke Patients Only)       Balance Overall balance assessment: Needs assistance Sitting-balance support: Feet supported Sitting balance-Leahy Scale: Fair     Standing balance support: Bilateral upper extremity supported Standing balance-Leahy Scale: Poor Standing balance comment: reliant on walker                            Cognition Arousal/Alertness: Awake/alert Behavior During Therapy: WFL for tasks assessed/performed Overall Cognitive Status: Within Functional Limits for tasks assessed  Exercises Other Exercises Other Exercises: 5 reps bil LE LAQ; 15 reps bil LE heel/toe raises (Seated)    General Comments        Pertinent Vitals/Pain Pain Assessment: Faces Faces Pain Scale: Hurts even more Pain Location: LE's and feet with mobility and exercise Pain Descriptors / Indicators: Discomfort;Grimacing Pain Intervention(s): Limited activity within  patient's tolerance;Monitored during session;Repositioned;Patient requesting pain meds-RN notified    Home Living                      Prior Function            PT Goals (current goals can now be found in the care plan section) Acute Rehab PT Goals Patient Stated Goal: get stronger and home PT Goal Formulation: With patient/family Time For Goal Achievement: 03/11/21 Potential to Achieve Goals: Good Progress towards PT goals: Progressing toward goals    Frequency    Min 3X/week      PT Plan Current plan remains appropriate    Co-evaluation              AM-PAC PT "6 Clicks" Mobility   Outcome Measure  Help needed turning from your back to your side while in a flat bed without using bedrails?: None Help needed moving from lying on your back to sitting on the side of a flat bed without using bedrails?: A Little Help needed moving to and from a bed to a chair (including a wheelchair)?: A Little Help needed standing up from a chair using your arms (e.g., wheelchair or bedside chair)?: A Little Help needed to walk in hospital room?: A Little Help needed climbing 3-5 steps with a railing? : A Lot 6 Click Score: 18    End of Session Equipment Utilized During Treatment: Gait belt Activity Tolerance: Patient tolerated treatment well;Patient limited by pain (nauseated by pain) Patient left: in bed;with call bell/phone within reach;with family/visitor present Nurse Communication: Mobility status PT Visit Diagnosis: Muscle weakness (generalized) (M62.81);Difficulty in walking, not elsewhere classified (R26.2);Unsteadiness on feet (R26.81);Pain Pain - part of body: Leg     Time: 8828-0034 PT Time Calculation (min) (ACUTE ONLY): 26 min  Charges:  $Gait Training: 8-22 mins $Therapeutic Exercise: 8-22 mins                     Bruce Smith, DPT Acute Rehabilitation Services Office [phone] Pager [phone]     Jacques Navy 03/08/2021, 3:04  PM

## 2021-03-08 NOTE — Progress Notes (Signed)
Inpatient Rehab Admissions Coordinator:   CIR consult received and I spoke to pt and his spouse over the phone (separately).  They are both hopeful for CIR admission to help increase patient's independence, and ultimately prolong his ability to remain at home once discharged (he was readmitted to the acute setting only 2 weeks after discharging from [facility]).  We discussed that, given patient's need for assistance since January, it was possible that we would not be able to restore him to 100% of his baseline, but we would likely be able to help make modifications and educate on energy conservation in order to maximize his independence and decrease burden of care.  I also let them know that SNF for rehab would not be possible if insurance were to approve and he admitted to our rehab facility--the only option is home.  And they both agreed that SNF was not an option for them.  I will start insurance auth process today.  Ideally I would like to see him able to participate in at least 40 minutes of therapy since CIR sessions range anywhere from 30-90 minutes, but do add up to 3 hours daily and right now he's doing about 30 minutes daily.    Shann Medal, PT, DPT Admissions Coordinator [phone] 03/08/21  12:27 PM

## 2021-03-09 ENCOUNTER — Inpatient Hospital Stay: Payer: BC Managed Care – PPO

## 2021-03-09 DIAGNOSIS — E876 Hypokalemia: Secondary | ICD-10-CM | POA: Diagnosis not present

## 2021-03-09 DIAGNOSIS — C9000 Multiple myeloma not having achieved remission: Secondary | ICD-10-CM | POA: Diagnosis not present

## 2021-03-09 LAB — CBC WITH DIFFERENTIAL/PLATELET
Abs Immature Granulocytes: 0.07 10*3/uL (ref 0.00–0.07)
Basophils Absolute: 0 10*3/uL (ref 0.0–0.1)
Basophils Relative: 0 %
Eosinophils Absolute: 0.1 10*3/uL (ref 0.0–0.5)
Eosinophils Relative: 2 %
HCT: 29.9 % — ABNORMAL LOW (ref 39.0–52.0)
Hemoglobin: 9.9 g/dL — ABNORMAL LOW (ref 13.0–17.0)
Immature Granulocytes: 1 %
Lymphocytes Relative: 13 %
Lymphs Abs: 0.9 10*3/uL (ref 0.7–4.0)
MCH: 31.4 pg (ref 26.0–34.0)
MCHC: 33.1 g/dL (ref 30.0–36.0)
MCV: 94.9 fL (ref 80.0–100.0)
Monocytes Absolute: 0.7 10*3/uL (ref 0.1–1.0)
Monocytes Relative: 11 %
Neutro Abs: 4.9 10*3/uL (ref 1.7–7.7)
Neutrophils Relative %: 73 %
Platelets: 190 10*3/uL (ref 150–400)
RBC: 3.15 MIL/uL — ABNORMAL LOW (ref 4.22–5.81)
RDW: 15.8 % — ABNORMAL HIGH (ref 11.5–15.5)
WBC: 6.7 10*3/uL (ref 4.0–10.5)
nRBC: 0 % (ref 0.0–0.2)

## 2021-03-09 LAB — CULTURE, BLOOD (ROUTINE X 2)
Culture: NO GROWTH
Culture: NO GROWTH
Special Requests: ADEQUATE
Special Requests: ADEQUATE

## 2021-03-09 LAB — COMPREHENSIVE METABOLIC PANEL
ALT: 12 U/L (ref 0–44)
AST: 15 U/L (ref 15–41)
Albumin: 2.2 g/dL — ABNORMAL LOW (ref 3.5–5.0)
Alkaline Phosphatase: 81 U/L (ref 38–126)
Anion gap: 3 — ABNORMAL LOW (ref 5–15)
BUN: 10 mg/dL (ref 8–23)
CO2: 22 mmol/L (ref 22–32)
Calcium: 7.7 mg/dL — ABNORMAL LOW (ref 8.9–10.3)
Chloride: 112 mmol/L — ABNORMAL HIGH (ref 98–111)
Creatinine, Ser: 0.34 mg/dL — ABNORMAL LOW (ref 0.61–1.24)
GFR, Estimated: >60 mL/min (ref >60)
Glucose, Bld: 89 mg/dL (ref 70–99)
Potassium: 4 mmol/L (ref 3.5–5.1)
Sodium: 137 mmol/L (ref 135–145)
Total Bilirubin: 0.2 mg/dL — ABNORMAL LOW (ref 0.3–1.2)
Total Protein: 4.1 g/dL — ABNORMAL LOW (ref 6.5–8.1)

## 2021-03-09 LAB — MAGNESIUM: Magnesium: 1.8 mg/dL (ref 1.7–2.4)

## 2021-03-09 MED ORDER — SODIUM CHLORIDE 0.9 % IV SOLN
4.0000 g | Freq: Once | INTRAVENOUS | Status: AC
  Administered 2021-03-09: 4 g via INTRAVENOUS
  Filled 2021-03-09: qty 40

## 2021-03-09 NOTE — Progress Notes (Signed)
Occupational Therapy Treatment Patient Details Name: Bruce Smith MRN: [id number] DOB: 1950-11-29 Today's Date: 03/09/2021    History of present illness Patient is a 70 year old male with PMH significant for esophageal stricture and myeloma-extensive bone disease-pathologic fracture of left scapula and left femur. Pt recently had AS[facility] at [facility] on 01/22/2021.  Patient admitted due to weakness and inability to stand at doctor's office. Patient also with persistent nausea and vomiting with eating. S/p EGD 4/27   OT comments  Patient agreeable to out of bed activity, did provide encouragement/education to patient and family member on benefits of ambulating to bathroom vs using urinal to build endurance. Patient overall supervision level for functional ambulation to/from bathroom. Does rely heavily on grab bar due to lower toilet. Patient sat quickly onto toilet needing cue to manage underwear which patient was able to do at supervision level. After toileting tasks patient reports feeling a little short of breath, request to perform grooming/hygiene tasks seated at sink at set up level. Patient reports feeling tired today however agreeable to sitting up in recliner vs back to bed. Will continue with POC.    Follow Up Recommendations  CIR    Equipment Recommendations  Other (comment) (defer to next venue)       Precautions / Restrictions Precautions Precautions: Fall Precaution Comments: hx falls resulting in scapula, hip and tib fracture within past 4 months       Mobility Bed Mobility Overal bed mobility: Needs Assistance       Supine to sit: Supervision;HOB elevated          Transfers Overall transfer level: Needs assistance Equipment used: Rolling walker (2 wheeled) Transfers: Sit to/from Stand Sit to Stand: Supervision         General transfer comment: with increased time patient able to power up from toilet with use of grab bar    Balance Overall balance assessment: Needs  assistance Sitting-balance support: Feet supported Sitting balance-Leahy Scale: Fair     Standing balance support: No upper extremity supported;During functional activity Standing balance-Leahy Scale: Fair Standing balance comment: can manage clothing without UE support                           ADL either performed or assessed with clinical judgement   ADL Overall ADL's : Needs assistance/impaired     Grooming: Oral care;Wash/dry face;Wash/dry hands;Set up;Sitting Grooming Details (indicate cue type and reason): patient reports shortness of breath from ADLs in bathroom, request to sit in front of sink for grooming/hygiene                 Toilet Transfer: Supervision/safety;Ambulation;Regular Toilet;RW;Grab bars Toilet Transfer Details (indicate cue type and reason): patient at first requesting to use urinal, educate patient on importance of increasing out of bed activity foif ultimate goal is independence at home. patient verbalize understanding, overall supervision level with walker to ambulate to/from bathroom and to transfer on/off toilet Toileting- Clothing Manipulation and Hygiene: Supervision/safety;Sitting/lateral lean;Sit to/from stand Toileting - Clothing Manipulation Details (indicate cue type and reason): patient sat quickly onto toilet before lowering brief, cue patient to stand and encouraged patient to pull down himself. Able to manage brief and perianal care after bowel movement without physical assistance     Functional mobility during ADLs: Supervision/safety;Rolling walker General ADL Comments: patient presents with decreased overall activity tolerance and shortness of breath with ADL activity impacting independence with self care.  Cognition Arousal/Alertness: Awake/alert Behavior During Therapy: WFL for tasks assessed/performed Overall Cognitive Status: Within Functional Limits for tasks assessed                                                      Pertinent Vitals/ Pain       Pain Assessment: Faces Faces Pain Scale: Hurts little more Pain Location: legs Pain Descriptors / Indicators: Sore;Cramping Pain Intervention(s): Monitored during session         Frequency  Min 2X/week        Progress Toward Goals  OT Goals(current goals can now be found in the care plan section)  Progress towards OT goals: Progressing toward goals  Acute Rehab OT Goals Patient Stated Goal: get stronger and home OT Goal Formulation: With patient/family Time For Goal Achievement: 03/23/21 Potential to Achieve Goals: Good ADL Goals Pt Will Perform Lower Body Bathing: with supervision;with adaptive equipment;sitting/lateral leans;sit to/from stand Pt Will Perform Lower Body Dressing: with supervision;with adaptive equipment;sit to/from stand;sitting/lateral leans Pt Will Transfer to Toilet: with modified independence;ambulating (walker) Pt Will Perform Toileting - Clothing Manipulation and hygiene: with modified independence;sit to/from stand;sitting/lateral leans  Plan Discharge plan remains appropriate       AM-PAC OT "6 Clicks" Daily Activity     Outcome Measure   Help from another person eating meals?: None Help from another person taking care of personal grooming?: A Little Help from another person toileting, which includes using toliet, bedpan, or urinal?: A Little Help from another person bathing (including washing, rinsing, drying)?: A Lot Help from another person to put on and taking off regular upper body clothing?: A Little Help from another person to put on and taking off regular lower body clothing?: A Lot 6 Click Score: 17    End of Session Equipment Utilized During Treatment: Rolling walker  OT Visit Diagnosis: Muscle weakness (generalized) (M62.81);History of falling (Z91.81);Unsteadiness on feet (R26.81)   Activity Tolerance Patient tolerated treatment well   Patient Left in  chair;with call bell/phone within reach;with family/visitor present   Nurse Communication Mobility status        Time: 4888-9169 OT Time Calculation (min): 24 min  Charges: OT General Charges $OT Visit: 1 Visit OT Treatments $Self Care/Home Management : 23-37 mins  [name] OT OT pager: Mina 03/09/2021, 10:14 AM

## 2021-03-09 NOTE — Progress Notes (Signed)
Inpatient Rehab Admissions Coordinator:   Awaiting determination from insurance regarding auth for CIR.  Will continue to follow.   Shann Medal, PT, DPT Admissions Coordinator [phone] 03/09/21  12:04 PM

## 2021-03-09 NOTE — PMR Pre-admission (Signed)
PMR Admission Coordinator Pre-Admission Assessment  Patient: Bruce Smith is an 70 y.o., male MRN: [id number] DOB: Dec 09, 1950 Height:   Weight:    Insurance Information HMO:     PPO: yes     PCP:      IPA:      80/20:      OTHER:  PRIMARY: [employer] of [place]      Policy#: [id number]      Subscriber: pt CM Name: Larene Beach      Phone#: [phone]     Fax#: [phone] Pre-Cert#: [id number] auth for CIR faxed from [place], with updates due to her at fax listed above on 5/23.       Employer: n/a Benefits:  Phone #:      Name:  Eff. Date: 10/31/20     Deduct: $500      Out of Pocket Max: $1500 (met)      Life Max: n/a CIR: 90%      SNF: 90% Outpatient:      Co-Pay: $40/visit Home Health: 90%      Co-Ins: 10% DME: 90%     Co-Ins: 10% Providers: n/a SECONDARY: Medicare Part A      Policy#: [id number]      Phone#:   Financial Counselor:       Phone#:   The "Data Collection Information Summary" for patients in Inpatient Rehabilitation Facilities with attached "Privacy Act [place] Records" was provided and verbally reviewed with: Patient and Family  Emergency Contact Information Contact Information    Name Relation Home Work Mobile   [place] Spouse   [phone]   Bea Laura Daughter   [phone]      Current Medical History  Patient Admitting Diagnosis: debility following stem cell transplant for multiple myeloma  History of Present Illness: Pt is a 70 y/o male with PMH of CAD, HTN, MI, and diagnosis of multiple myeloma in August 2021, followed by Dr. [name], and underwent stem cell transplant at [facility] in March 2022.  Pt discharged home from [facility] and was readmitted to [facility] 2 weeks later, on 4/26 for weakness, nausea, vomiting, and LE pain.  Hospital course pain management, and nutrition management.  Pt on TPN, stopped 03/08/21.  Pt with bouts of delirium, agitation, thought related to pain regimen and all narcotics stopped.  Pain currently managed on Tylenol,  celebrex, and lyrica.  Therapy evaluations completed and pt continues to demonstrate weakness and deconditioning, therefore recommendations for CIR were made.      Patient's medical record from [facility] has been reviewed by the rehabilitation admission coordinator and physician.  Past Medical History  Past Medical History:  Diagnosis Date  . Atherosclerosis   . Coronary artery disease   . Goals of care, counseling/discussion 06/29/2020  . 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 Hyperlipidemia   . Hypertension   . 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 Multiple myeloma ([place]) 07/21/2020  . Myocardial infarction [facility])     Family History   family history is not on file.  Prior Rehab/Hospitalizations Has the patient had prior rehab or hospitalizations prior to admission? Yes  Has the patient had major surgery during 100 days prior to admission? No   Current Medications  Current Facility-Administered Medications:  .  acetaminophen (TYLENOL) tablet 1,000 mg, 1,000 mg, Oral, Q6H PRN, Micheline Rough, MD, 1,000 mg at 03/09/21 1147 .  acyclovir (ZOVIRAX) 200 MG capsule 400 mg, 400 mg, Oral, BID, Volanda Napoleon, MD, 400 mg at 03/09/21  2122 .  amoxicillin (AMOXIL) capsule 500 mg, 500 mg, Oral, Q8H, Ennever, Peter R, MD .  celecoxib (CELEBREX) capsule 200 mg, 200 mg, Oral, BID, Volanda Napoleon, MD, 200 mg at 03/09/21 2123 .  loperamide (IMODIUM) capsule 2 mg, 2 mg, Oral, PRN, Iruku, Praveena, MD, 2 mg at 03/07/21 1123 .  ondansetron (ZOFRAN) tablet 8 mg, 8 mg, Oral, Q8H PRN, Ennever, Rudell Cobb, MD .  pantoprazole (PROTONIX) EC tablet 40 mg, 40 mg, Oral, BID AC, Volanda Napoleon, MD, 40 mg at 03/10/21 0741 .  pregabalin (LYRICA) capsule 100 mg, 100 mg, Oral, Daily, Domingo Cocking, Gene, MD, 100 mg at 03/09/21 0945 .  pregabalin (LYRICA) capsule 150 mg, 150 mg, Oral, QHS, Domingo Cocking, Gene, MD, 150 mg at 03/09/21 2123 .  tamsulosin (FLOMAX) capsule 0.4 mg, 0.4 mg, Oral, QHS,  Volanda Napoleon, MD, 0.4 mg at 03/09/21 2123  Patients Current Diet:  Diet Order            Diet regular Room service appropriate? Yes; Fluid consistency: Thin  Diet effective now                 Precautions / Restrictions Precautions Precautions: Fall Precaution Comments: hx falls resulting in scapula, hip and tib fracture within past 4 months Restrictions Weight Bearing Restrictions: No   Has the patient had 2 or more falls or a fall with injury in the past year? No  Prior Activity Level Household: has been very limited in mobility since pathological tibia fracture in January, needing assist for all ADLs (min to mod), and even more debilitated following stem cell transplant in March  Prior Functional Level Self Care: Did the patient need help bathing, dressing, using the toilet or eating? Needed some help  Indoor Mobility: Did the patient need assistance with walking from room to room (with or without device)? Needed some help  Stairs: Did the patient need assistance with internal or external stairs (with or without device)? Needed some help  Functional Cognition: Did the patient need help planning regular tasks such as shopping or remembering to take medications? Needed some help  Home Assistive Devices / [place] Devices/Equipment: None Home Equipment: Shower seat - built in,Hand held shower head,Walker - 4 wheels,Bedside commode,Grab bars - tub/shower,Grab bars - toilet,Walker - 2 wheels,Cane - single point  Prior Device Use: Indicate devices/aids used by the patient prior to current illness, exacerbation or injury? Manual wheelchair and Walker  Current Functional Level Cognition  Overall Cognitive Status: Within Functional Limits for tasks assessed Orientation Level: Oriented X4 General Comments: Anxious when attempting to take a step.    Extremity Assessment (includes Sensation/Coordination)  Upper Extremity Assessment: Defer to OT evaluation   Lower Extremity Assessment: Generalized weakness    ADLs  Overall ADL's : Needs assistance/impaired Grooming: Oral care,Wash/dry face,Wash/dry hands,Set up,Sitting Grooming Details (indicate cue type and reason): patient reports shortness of breath from ADLs in bathroom, request to sit in front of sink for grooming/hygiene Upper Body Bathing: Set up,Sitting Lower Body Bathing: Moderate assistance,Sitting/lateral leans,Sit to/from stand Lower Body Bathing Details (indicate cue type and reason): Pt educated on use of long handled bath sponge/brush to reach LEs while seated in shower to avoid single leg stance. Upper Body Dressing : Set up,Sitting Lower Body Dressing: Moderate assistance,Sitting/lateral leans,Sit to/from stand Lower Body Dressing Details (indicate cue type and reason): seated EOB patient able to doff sock with increased time/effort and use of reacher.  Pt educated on use of sock aid and  attempted standard size but it was too narrow. Pt used extra-wide aid and able to place sock on aid after instruction and on to RT foot only.  Held on LT foot due to pain. Pt educated on use of reacher to don dipaer brief. Pt declined to practice today due to LE pain. OT demonstrated process to him and pt verbalized understanding. Toilet Transfer: Supervision/safety,Ambulation,Regular Toilet,RW,Grab TEFL teacher Details (indicate cue type and reason): patient at first requesting to use urinal, educate patient on importance of increasing out of bed activity foif ultimate goal is independence at home. patient verbalize understanding, overall supervision level with walker to ambulate to/from bathroom and to transfer on/off toilet Toileting- Clothing Manipulation and Hygiene: Supervision/safety,Sitting/lateral lean,Sit to/from stand Toileting - Clothing Manipulation Details (indicate cue type and reason): patient sat quickly onto toilet before lowering brief, cue patient to stand and encouraged  patient to pull down himself. Able to manage brief and perianal care after bowel movement without physical assistance Functional mobility during ADLs: Supervision/safety,Rolling walker General ADL Comments: patient presents with decreased overall activity tolerance and shortness of breath with ADL activity impacting independence with self care.    Mobility  Overal bed mobility: Needs Assistance Bed Mobility: Sit to Supine,Supine to Sit Supine to sit: Supervision,HOB elevated Sit to supine: Min guard General bed mobility comments: supervision/guarding for spine<>sit EOB, cues to avoid use of elevated HOB an dpt able to press trunk up with bil UE's.    Transfers  Overall transfer level: Needs assistance Equipment used: Rolling walker (2 wheeled) Transfers: Sit to/from Stand Sit to Stand: Supervision Stand pivot transfers: Min assist General transfer comment: with increased time patient able to power up from toilet with use of grab bar    Ambulation / Gait / Stairs / Wheelchair Mobility  Ambulation/Gait Ambulation/Gait assistance: Min assist,Min guard Gait Distance (Feet): 75 Feet (25, 20, 30) Assistive device: Rolling walker (2 wheeled) Gait Pattern/deviations: Step-through pattern,Decreased stride length,Shuffle General Gait Details: pt amb 3x short bouts of gait in hallway with seated rest for recovery 2/2 Lt>Rt LE pain. Patient required min guard on first bout and assist during 2nd/3rd bout due to fatigue and knees/hips more flexed. Gait velocity: decr    Posture / Balance Dynamic Sitting Balance Sitting balance - Comments: Pt on EOB of air mattress. Pt will have controlled losses of balance with using UEs for fuctional tasks, but catches self with forearms. Cues to recruit abd muscles for upright sitting and pt agreed to trying, but still with lateral losses of balance. Balance Overall balance assessment: Needs assistance Sitting-balance support: Feet supported Sitting  balance-Leahy Scale: Fair Sitting balance - Comments: Pt on EOB of air mattress. Pt will have controlled losses of balance with using UEs for fuctional tasks, but catches self with forearms. Cues to recruit abd muscles for upright sitting and pt agreed to trying, but still with lateral losses of balance. Postural control: Right lateral lean,Left lateral lean Standing balance support: No upper extremity supported,During functional activity Standing balance-Leahy Scale: Fair Standing balance comment: can manage clothing without UE support    Special needs/care consideration Continuous Drip IV  ampicillin and Special service needs neuropsych   Previous Home Environment (from acute therapy documentation) Living Arrangements: Spouse/significant other Available Help at Discharge: Family,Available 24 hours/day Type of Home: [place]: Two level,Able to live on main level with bedroom/bathroom,Full bath on main level Home Access: Stairs to enter,Ramped entrance Entrance Stairs-Rails: Right Entrance Stairs-Number of Steps: 3 Bathroom Shower/Tub: Holiday representative  Toilet: Standard Home Care Services: Yes Type of Home Care Services: Home OT,Home PT Additional Comments: Has an adjustable bed  Discharge Living Setting Plans for Discharge Living Setting: Patient's home,Lives with (comment) (wife) Type of Home at Discharge: House Discharge Home Layout: One level Discharge Home Access: [place] entrance Discharge Bathroom Shower/Tub: Walk-in shower Discharge Bathroom Toilet: Standard Discharge [place] Accessibility: Yes How Accessible: Accessible via walker (rollator) Does the patient have any problems obtaining your medications?: No  Social/Family/Support Systems Patient Roles: Spouse Anticipated Caregiver: Absalom Aro Anticipated Caregiver's Contact Information: [phone] Ability/Limitations of Caregiver: in remission from cancer as well, cannot sustain more than min assist for  ADLs and supervision for mobility Caregiver Availability: 24/7 Discharge Plan Discussed with Primary Caregiver: Yes Is Caregiver In Agreement with Plan?: Yes Does Caregiver/Family have Issues with Lodging/Transportation while Pt is in Rehab?: No  Goals Patient/Family Goal for Rehab: PT/OT mod I to min assist dependent on fatigue Expected length of stay: 6-9 days Pt/Family Agrees to Admission and willing to participate: Yes Program Orientation Provided & Reviewed with Pt/Caregiver Including Roles  & Responsibilities: Yes  Barriers to Discharge: Insurance for SNF coverage  Decrease burden of Care through IP rehab admission: Patient/family education  Possible need for SNF placement upon discharge: not anticipated  Patient Condition: I have reviewed medical records from [facility], spoken with CM, and patient and spouse. I discussed via phone for inpatient rehabilitation assessment.  Patient will benefit from ongoing PT and OT, can actively participate in 3 hours of therapy a day 5 days of the week, and can make measurable gains during the admission.  Patient will also benefit from the coordinated team approach during an Inpatient Acute Rehabilitation admission.  The patient will receive intensive therapy as well as Rehabilitation physician, nursing, social worker, and care management interventions.  Due to bladder management, bowel management, safety, skin/wound care, disease management, medication administration, pain management and patient education the patient requires 24 hour a day rehabilitation nursing.  The patient is currently min assist with mobility and basic ADLs.  Discharge setting and therapy post discharge at home with home health is anticipated.  Patient has agreed to participate in the Acute Inpatient Rehabilitation Program and will admit today.  Preadmission Screen Completed By:  Bonnee Quin, DPT 03/10/2021 10:08  AM ______________________________________________________________________   Discussed status with Dr. Posey Pronto on 03/10/21  at 10:08 AM  and received approval for admission today.  Admission Coordinator:  Michel Santee, PT, DPT time 10:08 AM Bruce Smith 03/10/21    Assessment/Plan: Diagnosis: Debility 1. Does the need for close, 24 hr/day Medical supervision in concert with the patient's rehab needs make it unreasonable for this patient to be served in a less intensive setting? Yes 2. Co-Morbidities requiring supervision/potential complications: CAD, HTN (monitor and provide prns in accordance with increased physical exertion and pain), MI, and diagnosis of multiple myeloma in August 2021 3. Due to safety, disease management and patient education, does the patient require 24 hr/day rehab nursing? Yes 4. Does the patient require coordinated care of a physician, rehab nurse, PT, OT to address physical and functional deficits in the context of the above medical diagnosis(es)? Yes Addressing deficits in the following areas: balance, endurance, locomotion, strength, transferring, bathing, dressing, toileting and psychosocial support 5. Can the patient actively participate in an intensive therapy program of at least 3 hrs of therapy 5 days a week? Yes 6. The potential for patient to make measurable gains while on inpatient rehab is excellent 7. Anticipated functional  outcomes upon discharge from inpatient rehab: modified independent and supervision PT, modified independent and supervision OT, n/a SLP 8. Estimated rehab length of stay to reach the above functional goals is: 7-10 days. 9. Anticipated discharge destination: Home 10. Overall Rehab/Functional Prognosis: good   MD Signature: Delice Lesch, MD, ABPMR

## 2021-03-09 NOTE — Progress Notes (Signed)
Inpatient Rehab Admissions Coordinator:   I did get insurance authorization for CIR. Discussed with Dr. [name] and will plan to admit pt to CIR tomorrow.  I will confirm details for transport, etc with TOC and nursing tomorrow morning.   Shann Medal, PT, DPT Admissions Coordinator [phone] 03/09/21  1:55 PM

## 2021-03-09 NOTE — Progress Notes (Signed)
I think that Bruce Smith is making some progress now.  He just looks better.  He seems to be eating a little bit better.  He was out of bed and did some physical therapy.  He walked more.  We are seeing if he will qualify for [facility].  It would be nice if he did qualify for this.  I think this would certainly help with his mobility and with his independence.  Is now back on ampicillin for the Enterococcus UTI.  We will go ahead and give him some more calcium today.  His calcium is still on the low side at 7.7.  He has had no nausea or vomiting.  There is no problems with diarrhea.  He is still has some soreness in his legs.  I did start him on some Celebrex to see if this might help a little bit.  There is been no problems with bleeding.  On his physical exam, his temperature is 98.6.  Pulse 86.  Blood pressure 122/69.  Oxygen saturation is 98%.  There is really  no change in his physical exam.  I can see that he is doing better.  He is getting stronger.  I think the fact that he is eating better is helping.  Hopefully we will start to see his albumin go up.  I think whenever he is excepted over to [facility], then we can get him over there.  I do appreciate the outstanding care that he is getting from all the staff on [address].  Lattie Haw, MD  Darlyn Chamber 29:11

## 2021-03-10 ENCOUNTER — Other Ambulatory Visit: Payer: Self-pay

## 2021-03-10 ENCOUNTER — Encounter (HOSPITAL_COMMUNITY): Payer: Self-pay | Admitting: Physical Medicine and Rehabilitation

## 2021-03-10 ENCOUNTER — Inpatient Hospital Stay (HOSPITAL_COMMUNITY)
Admission: RE | Admit: 2021-03-10 | Discharge: 2021-03-17 | DRG: 945 | Disposition: A | Discharge status: Home Health | Payer: BC Managed Care – PPO | Source: Other Acute Inpatient Hospital | Attending: Physical Medicine and Rehabilitation | Admitting: Physical Medicine and Rehabilitation

## 2021-03-10 ENCOUNTER — Encounter (HOSPITAL_COMMUNITY): Payer: Self-pay | Admitting: Hematology & Oncology

## 2021-03-10 DIAGNOSIS — R197 Diarrhea, unspecified: Secondary | ICD-10-CM

## 2021-03-10 DIAGNOSIS — Z79899 Other long term (current) drug therapy: Secondary | ICD-10-CM | POA: Diagnosis not present

## 2021-03-10 DIAGNOSIS — I952 Hypotension due to drugs: Secondary | ICD-10-CM | POA: Diagnosis not present

## 2021-03-10 DIAGNOSIS — B952 Enterococcus as the cause of diseases classified elsewhere: Secondary | ICD-10-CM | POA: Diagnosis present

## 2021-03-10 DIAGNOSIS — E440 Moderate protein-calorie malnutrition: Secondary | ICD-10-CM | POA: Diagnosis present

## 2021-03-10 DIAGNOSIS — R5381 Other malaise: Secondary | ICD-10-CM | POA: Diagnosis present

## 2021-03-10 DIAGNOSIS — I252 Old myocardial infarction: Secondary | ICD-10-CM | POA: Diagnosis not present

## 2021-03-10 DIAGNOSIS — C9000 Multiple myeloma not having achieved remission: Secondary | ICD-10-CM | POA: Diagnosis present

## 2021-03-10 DIAGNOSIS — Z87891 Personal history of nicotine dependence: Secondary | ICD-10-CM | POA: Diagnosis not present

## 2021-03-10 DIAGNOSIS — D638 Anemia in other chronic diseases classified elsewhere: Secondary | ICD-10-CM | POA: Diagnosis present

## 2021-03-10 DIAGNOSIS — R52 Pain, unspecified: Secondary | ICD-10-CM

## 2021-03-10 DIAGNOSIS — I2510 Atherosclerotic heart disease of native coronary artery without angina pectoris: Secondary | ICD-10-CM | POA: Diagnosis present

## 2021-03-10 DIAGNOSIS — R627 Adult failure to thrive: Secondary | ICD-10-CM | POA: Diagnosis present

## 2021-03-10 DIAGNOSIS — D696 Thrombocytopenia, unspecified: Secondary | ICD-10-CM | POA: Diagnosis present

## 2021-03-10 DIAGNOSIS — Z515 Encounter for palliative care: Secondary | ICD-10-CM | POA: Diagnosis not present

## 2021-03-10 DIAGNOSIS — I10 Essential (primary) hypertension: Secondary | ICD-10-CM | POA: Diagnosis present

## 2021-03-10 DIAGNOSIS — E785 Hyperlipidemia, unspecified: Secondary | ICD-10-CM | POA: Diagnosis present

## 2021-03-10 DIAGNOSIS — M79672 Pain in left foot: Secondary | ICD-10-CM

## 2021-03-10 DIAGNOSIS — Z7189 Other specified counseling: Secondary | ICD-10-CM | POA: Diagnosis not present

## 2021-03-10 DIAGNOSIS — N390 Urinary tract infection, site not specified: Secondary | ICD-10-CM | POA: Diagnosis present

## 2021-03-10 DIAGNOSIS — M79671 Pain in right foot: Secondary | ICD-10-CM

## 2021-03-10 DIAGNOSIS — T446X5A Adverse effect of alpha-adrenoreceptor antagonists, initial encounter: Secondary | ICD-10-CM | POA: Diagnosis not present

## 2021-03-10 DIAGNOSIS — E876 Hypokalemia: Secondary | ICD-10-CM | POA: Diagnosis not present

## 2021-03-10 DIAGNOSIS — C419 Malignant neoplasm of bone and articular cartilage, unspecified: Secondary | ICD-10-CM

## 2021-03-10 DIAGNOSIS — Z6822 Body mass index (BMI) 22.0-22.9, adult: Secondary | ICD-10-CM

## 2021-03-10 DIAGNOSIS — R531 Weakness: Secondary | ICD-10-CM

## 2021-03-10 LAB — CBC WITH DIFFERENTIAL/PLATELET
Abs Immature Granulocytes: 0.05 10*3/uL (ref 0.00–0.07)
Basophils Absolute: 0 10*3/uL (ref 0.0–0.1)
Basophils Relative: 0 %
Eosinophils Absolute: 0.1 10*3/uL (ref 0.0–0.5)
Eosinophils Relative: 2 %
HCT: 30.1 % — ABNORMAL LOW (ref 39.0–52.0)
Hemoglobin: 9.8 g/dL — ABNORMAL LOW (ref 13.0–17.0)
Immature Granulocytes: 1 %
Lymphocytes Relative: 23 %
Lymphs Abs: 1.2 10*3/uL (ref 0.7–4.0)
MCH: 31.1 pg (ref 26.0–34.0)
MCHC: 32.6 g/dL (ref 30.0–36.0)
MCV: 95.6 fL (ref 80.0–100.0)
Monocytes Absolute: 0.7 10*3/uL (ref 0.1–1.0)
Monocytes Relative: 13 %
Neutro Abs: 3.1 10*3/uL (ref 1.7–7.7)
Neutrophils Relative %: 61 %
Platelets: 182 10*3/uL (ref 150–400)
RBC: 3.15 MIL/uL — ABNORMAL LOW (ref 4.22–5.81)
RDW: 16 % — ABNORMAL HIGH (ref 11.5–15.5)
WBC: 5.1 10*3/uL (ref 4.0–10.5)
nRBC: 0 % (ref 0.0–0.2)

## 2021-03-10 LAB — MAGNESIUM: Magnesium: 1.7 mg/dL (ref 1.7–2.4)

## 2021-03-10 MED ORDER — PREGABALIN 50 MG PO CAPS
100.0000 mg | ORAL_CAPSULE | Freq: Every day | ORAL | Status: DC
  Administered 2021-03-11 – 2021-03-17 (×7): 100 mg via ORAL
  Filled 2021-03-10 (×7): qty 2

## 2021-03-10 MED ORDER — ONDANSETRON HCL 8 MG PO TABS: 8.0000 mg | ORAL_TABLET | Freq: Three times a day (TID) | ORAL | Status: DC | PRN

## 2021-03-10 MED ORDER — AMOXICILLIN 250 MG PO CAPS
500.0000 mg | ORAL_CAPSULE | Freq: Three times a day (TID) | ORAL | Status: AC
  Administered 2021-03-10 – 2021-03-13 (×9): 500 mg via ORAL
  Filled 2021-03-10 (×9): qty 2

## 2021-03-10 MED ORDER — FLEET ENEMA 7-19 GM/118ML RE ENEM
1.0000 | ENEMA | Freq: Once | RECTAL | Status: DC | PRN
Start: 2021-03-10 — End: 2021-03-17

## 2021-03-10 MED ORDER — CALCIUM POLYCARBOPHIL 625 MG PO TABS
625.0000 mg | ORAL_TABLET | Freq: Every day | ORAL | Status: DC
  Administered 2021-03-10 – 2021-03-17 (×8): 625 mg via ORAL
  Filled 2021-03-10 (×8): qty 1

## 2021-03-10 MED ORDER — AMOXICILLIN 500 MG PO CAPS: 500.0000 mg | ORAL_CAPSULE | Freq: Three times a day (TID) | ORAL | 0 refills | Status: DC

## 2021-03-10 MED ORDER — SACCHAROMYCES BOULARDII 250 MG PO CAPS
250.0000 mg | ORAL_CAPSULE | Freq: Two times a day (BID) | ORAL | Status: DC
  Administered 2021-03-10 – 2021-03-17 (×15): 250 mg via ORAL
  Filled 2021-03-10 (×15): qty 1

## 2021-03-10 MED ORDER — GUAIFENESIN-DM 100-10 MG/5ML PO SYRP: 5.0000 mL | ORAL_SOLUTION | Freq: Four times a day (QID) | ORAL | Status: DC | PRN

## 2021-03-10 MED ORDER — ALUM & MAG HYDROXIDE-SIMETH 200-200-20 MG/5ML PO SUSP: 30.0000 mL | ORAL | Status: DC | PRN

## 2021-03-10 MED ORDER — ACYCLOVIR 200 MG PO CAPS
400.0000 mg | ORAL_CAPSULE | Freq: Two times a day (BID) | ORAL | Status: DC
  Administered 2021-03-10 – 2021-03-17 (×14): 400 mg via ORAL
  Filled 2021-03-10 (×16): qty 2

## 2021-03-10 MED ORDER — PANTOPRAZOLE SODIUM 40 MG PO TBEC
40.0000 mg | DELAYED_RELEASE_TABLET | Freq: Two times a day (BID) | ORAL | Status: DC
Start: 2021-03-10 — End: 2021-03-17
  Administered 2021-03-10 – 2021-03-17 (×14): 40 mg via ORAL
  Filled 2021-03-10 (×14): qty 1

## 2021-03-10 MED ORDER — DIPHENHYDRAMINE HCL 12.5 MG/5ML PO ELIX: 12.5000 mg | ORAL_SOLUTION | Freq: Four times a day (QID) | ORAL | Status: DC | PRN

## 2021-03-10 MED ORDER — CELECOXIB 200 MG PO CAPS
200.0000 mg | ORAL_CAPSULE | Freq: Two times a day (BID) | ORAL | Status: DC
  Administered 2021-03-10 – 2021-03-11 (×3): 200 mg via ORAL
  Filled 2021-03-10 (×5): qty 1

## 2021-03-10 MED ORDER — PANTOPRAZOLE SODIUM 40 MG PO TBEC: 40.0000 mg | DELAYED_RELEASE_TABLET | Freq: Two times a day (BID) | ORAL | 3 refills | Status: DC

## 2021-03-10 MED ORDER — CELECOXIB 200 MG PO CAPS: 200.0000 mg | ORAL_CAPSULE | Freq: Two times a day (BID) | ORAL | 2 refills | Status: DC

## 2021-03-10 MED ORDER — PROCHLORPERAZINE EDISYLATE 10 MG/2ML IJ SOLN: 5.0000 mg | Freq: Four times a day (QID) | INTRAMUSCULAR | Status: DC | PRN

## 2021-03-10 MED ORDER — AMOXICILLIN-POT CLAVULANATE 875-125 MG PO TABS: 1.0000 | ORAL_TABLET | Freq: Two times a day (BID) | ORAL | 0 refills | Status: DC

## 2021-03-10 MED ORDER — TRAZODONE HCL 50 MG PO TABS: 25.0000 mg | ORAL_TABLET | Freq: Every evening | ORAL | Status: DC | PRN

## 2021-03-10 MED ORDER — LOPERAMIDE HCL 2 MG PO CAPS
2.0000 mg | ORAL_CAPSULE | ORAL | Status: DC | PRN
  Administered 2021-03-11 – 2021-03-12 (×2): 2 mg via ORAL
  Filled 2021-03-10 (×2): qty 1

## 2021-03-10 MED ORDER — BISACODYL 10 MG RE SUPP: 10.0000 mg | Freq: Every day | RECTAL | Status: DC | PRN

## 2021-03-10 MED ORDER — POLYETHYLENE GLYCOL 3350 17 G PO PACK: 17.0000 g | PACK | Freq: Every day | ORAL | Status: DC | PRN

## 2021-03-10 MED ORDER — PROCHLORPERAZINE 25 MG RE SUPP: 12.5000 mg | Freq: Four times a day (QID) | RECTAL | Status: DC | PRN

## 2021-03-10 MED ORDER — ACETAMINOPHEN 500 MG PO TABS: 1000.0000 mg | ORAL_TABLET | Freq: Four times a day (QID) | ORAL | Status: DC | PRN

## 2021-03-10 MED ORDER — ACETAMINOPHEN 325 MG PO TABS
325.0000 mg | ORAL_TABLET | ORAL | Status: DC | PRN
  Administered 2021-03-10: 650 mg via ORAL
  Filled 2021-03-10: qty 2

## 2021-03-10 MED ORDER — TAMSULOSIN HCL 0.4 MG PO CAPS
0.4000 mg | ORAL_CAPSULE | Freq: Every day | ORAL | Status: DC
  Administered 2021-03-10: 0.4 mg via ORAL
  Filled 2021-03-10: qty 1

## 2021-03-10 MED ORDER — AMOXICILLIN 250 MG PO CAPS: 500.0000 mg | ORAL_CAPSULE | Freq: Three times a day (TID) | ORAL | Status: DC

## 2021-03-10 MED ORDER — ENOXAPARIN SODIUM 40 MG/0.4ML IJ SOSY
40.0000 mg | PREFILLED_SYRINGE | INTRAMUSCULAR | Status: DC
  Administered 2021-03-10 – 2021-03-16 (×7): 40 mg via SUBCUTANEOUS
  Filled 2021-03-10 (×7): qty 0.4

## 2021-03-10 MED ORDER — BOOST / RESOURCE BREEZE PO LIQD CUSTOM
1.0000 | Freq: Three times a day (TID) | ORAL | Status: DC
  Administered 2021-03-10: 1 via ORAL

## 2021-03-10 MED ORDER — PREGABALIN 75 MG PO CAPS
150.0000 mg | ORAL_CAPSULE | Freq: Every day | ORAL | Status: DC
  Administered 2021-03-10 – 2021-03-16 (×7): 150 mg via ORAL
  Filled 2021-03-10 (×7): qty 2

## 2021-03-10 MED ORDER — ONDANSETRON HCL 4 MG PO TABS
8.0000 mg | ORAL_TABLET | Freq: Three times a day (TID) | ORAL | Status: DC | PRN
  Administered 2021-03-12 – 2021-03-14 (×5): 8 mg via ORAL
  Filled 2021-03-10 (×8): qty 2

## 2021-03-10 MED ORDER — PROCHLORPERAZINE MALEATE 5 MG PO TABS
5.0000 mg | ORAL_TABLET | Freq: Four times a day (QID) | ORAL | Status: DC | PRN
Start: 2021-03-10 — End: 2021-03-17
  Administered 2021-03-11: 10 mg via ORAL
  Filled 2021-03-10: qty 2

## 2021-03-10 MED ORDER — AMOXICILLIN-POT CLAVULANATE 875-125 MG PO TABS: 1.0000 | ORAL_TABLET | Freq: Two times a day (BID) | ORAL | Status: DC

## 2021-03-10 NOTE — Progress Notes (Signed)
PMR Admission Coordinator Pre-Admission Assessment   Patient: Bruce Smith is an 70 y.o., male MRN: [id number] DOB: August 29, 1951 Height:   Weight:     Insurance Information HMO:     PPO: yes     PCP:      IPA:      80/20:      OTHER:  PRIMARY: [employer] [place]      Policy#: [id number]      Subscriber: pt CM Name: Larene Beach      Phone#: [phone]     Fax#: [phone] Pre-Cert#: [id number] auth for CIR faxed from [place], with updates due to her at fax listed above on 5/23.       Employer: n/a Benefits:  Phone #:      Name:  Eff. Date: 10/31/20     Deduct: $500      Out of Pocket Max: $1500 (met)      Life Max: n/a CIR: 90%      SNF: 90% Outpatient:      Co-Pay: $40/visit Home Health: 90%      Co-Ins: 10% DME: 90%     Co-Ins: 10% Providers: n/a SECONDARY: Medicare Part A      Policy#: [id number]      Phone#:    Financial Counselor:       Phone#:    The "Data Collection Information Summary" for patients in Inpatient Rehabilitation Facilities with attached "Privacy Act [place] Records" was provided and verbally reviewed with: Patient and Family   Emergency Contact Information         Contact Information     Name Relation Home Work Mobile    [place] Spouse     [phone]    Bea Laura Daughter     [phone]         Current Medical History  Patient Admitting Diagnosis: debility following stem cell transplant for multiple myeloma   History of Present Illness: Pt is a 70 y/o male with PMH of CAD, HTN, MI, and diagnosis of multiple myeloma in August 2021, followed by Dr. [name], and underwent stem cell transplant at [facility] in March 2022.  Pt discharged home from [facility] and was readmitted to [facility] 2 weeks later, on 4/26 for weakness, nausea, vomiting, and LE pain.  Hospital course pain management, and nutrition management.  Pt on TPN, stopped 03/08/21.  Pt with bouts of delirium, agitation, thought related to pain regimen and all narcotics stopped.  Pain currently  managed on Tylenol, celebrex, and lyrica.  Therapy evaluations completed and pt continues to demonstrate weakness and deconditioning, therefore recommendations for CIR were made.     Patient's medical record from [facility] has been reviewed by the rehabilitation admission coordinator and physician.   Past Medical History      Past Medical History:  Diagnosis Date  . Atherosclerosis    . Coronary artery disease    . Goals of care, counseling/discussion 06/29/2020  . 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 Hyperlipidemia    . Hypertension    . 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 Multiple myeloma ([place]) 07/21/2020  . Myocardial infarction [facility])        Family History   family history is not on file.   Prior Rehab/Hospitalizations Has the patient had prior rehab or hospitalizations prior to admission? Yes   Has the patient had major surgery during 100 days prior to admission? No  Current Medications   Current Facility-Administered Medications:  .  acetaminophen (TYLENOL) tablet 1,000 mg, 1,000 mg, Oral, Q6H PRN, Micheline Rough, MD, 1,000 mg at 03/09/21 1147 .  acyclovir (ZOVIRAX) 200 MG capsule 400 mg, 400 mg, Oral, BID, Volanda Napoleon, MD, 400 mg at 03/09/21 2122 .  amoxicillin (AMOXIL) capsule 500 mg, 500 mg, Oral, Q8H, Ennever, Peter R, MD .  celecoxib (CELEBREX) capsule 200 mg, 200 mg, Oral, BID, Volanda Napoleon, MD, 200 mg at 03/09/21 2123 .  loperamide (IMODIUM) capsule 2 mg, 2 mg, Oral, PRN, Iruku, Praveena, MD, 2 mg at 03/07/21 1123 .  ondansetron (ZOFRAN) tablet 8 mg, 8 mg, Oral, Q8H PRN, Ennever, Rudell Cobb, MD .  pantoprazole (PROTONIX) EC tablet 40 mg, 40 mg, Oral, BID AC, Volanda Napoleon, MD, 40 mg at 03/10/21 0741 .  pregabalin (LYRICA) capsule 100 mg, 100 mg, Oral, Daily, Domingo Cocking, Gene, MD, 100 mg at 03/09/21 0945 .  pregabalin (LYRICA) capsule 150 mg, 150 mg, Oral, QHS, Domingo Cocking, Gene, MD, 150 mg at 03/09/21 2123 .   tamsulosin (FLOMAX) capsule 0.4 mg, 0.4 mg, Oral, QHS, Volanda Napoleon, MD, 0.4 mg at 03/09/21 2123   Patients Current Diet:     Diet Order                      Diet regular Room service appropriate? Yes; Fluid consistency: Thin  Diet effective now                      Precautions / Restrictions Precautions Precautions: Fall Precaution Comments: hx falls resulting in scapula, hip and tib fracture within past 4 months Restrictions Weight Bearing Restrictions: No    Has the patient had 2 or more falls or a fall with injury in the past year? No   Prior Activity Level Household: has been very limited in mobility since pathological tibia fracture in January, needing assist for all ADLs (min to mod), and even more debilitated following stem cell transplant in March   Prior Functional Level Self Care: Did the patient need help bathing, dressing, using the toilet or eating? Needed some help   Indoor Mobility: Did the patient need assistance with walking from room to room (with or without device)? Needed some help   Stairs: Did the patient need assistance with internal or external stairs (with or without device)? Needed some help   Functional Cognition: Did the patient need help planning regular tasks such as shopping or remembering to take medications? Needed some help   Home Assistive Devices / [place] Devices/Equipment: None Home Equipment: Shower seat - built in,Hand held shower head,Walker - 4 wheels,Bedside commode,Grab bars - tub/shower,Grab bars - toilet,Walker - 2 wheels,Cane - single point   Prior Device Use: Indicate devices/aids used by the patient prior to current illness, exacerbation or injury? Manual wheelchair and Walker   Current Functional Level Cognition   Overall Cognitive Status: Within Functional Limits for tasks assessed Orientation Level: Oriented X4 General Comments: Anxious when attempting to take a step.    Extremity  Assessment (includes Sensation/Coordination)   Upper Extremity Assessment: Defer to OT evaluation  Lower Extremity Assessment: Generalized weakness     ADLs   Overall ADL's : Needs assistance/impaired Grooming: Oral care,Wash/dry face,Wash/dry hands,Set up,Sitting Grooming Details (indicate cue type and reason): patient reports shortness of breath from ADLs in bathroom, request to sit in front of sink for grooming/hygiene Upper Body Bathing: Set up,Sitting Lower Body Bathing:  Moderate assistance,Sitting/lateral leans,Sit to/from stand Lower Body Bathing Details (indicate cue type and reason): Pt educated on use of long handled bath sponge/brush to reach LEs while seated in shower to avoid single leg stance. Upper Body Dressing : Set up,Sitting Lower Body Dressing: Moderate assistance,Sitting/lateral leans,Sit to/from stand Lower Body Dressing Details (indicate cue type and reason): seated EOB patient able to doff sock with increased time/effort and use of reacher.  Pt educated on use of sock aid and attempted standard size but it was too narrow. Pt used extra-wide aid and able to place sock on aid after instruction and on to RT foot only.  Held on LT foot due to pain. Pt educated on use of reacher to don dipaer brief. Pt declined to practice today due to LE pain. OT demonstrated process to him and pt verbalized understanding. Toilet Transfer: Supervision/safety,Ambulation,Regular Toilet,RW,Grab TEFL teacher Details (indicate cue type and reason): patient at first requesting to use urinal, educate patient on importance of increasing out of bed activity foif ultimate goal is independence at home. patient verbalize understanding, overall supervision level with walker to ambulate to/from bathroom and to transfer on/off toilet Toileting- Clothing Manipulation and Hygiene: Supervision/safety,Sitting/lateral lean,Sit to/from stand Toileting - Clothing Manipulation Details (indicate cue type and  reason): patient sat quickly onto toilet before lowering brief, cue patient to stand and encouraged patient to pull down himself. Able to manage brief and perianal care after bowel movement without physical assistance Functional mobility during ADLs: Supervision/safety,Rolling walker General ADL Comments: patient presents with decreased overall activity tolerance and shortness of breath with ADL activity impacting independence with self care.     Mobility   Overal bed mobility: Needs Assistance Bed Mobility: Sit to Supine,Supine to Sit Supine to sit: Supervision,HOB elevated Sit to supine: Min guard General bed mobility comments: supervision/guarding for spine<>sit EOB, cues to avoid use of elevated HOB an dpt able to press trunk up with bil UE's.     Transfers   Overall transfer level: Needs assistance Equipment used: Rolling walker (2 wheeled) Transfers: Sit to/from Stand Sit to Stand: Supervision Stand pivot transfers: Min assist General transfer comment: with increased time patient able to power up from toilet with use of grab bar     Ambulation / Gait / Stairs / Wheelchair Mobility   Ambulation/Gait Ambulation/Gait assistance: Min assist,Min guard Gait Distance (Feet): 75 Feet (25, 20, 30) Assistive device: Rolling walker (2 wheeled) Gait Pattern/deviations: Step-through pattern,Decreased stride length,Shuffle General Gait Details: pt amb 3x short bouts of gait in hallway with seated rest for recovery 2/2 Lt>Rt LE pain. Patient required min guard on first bout and assist during 2nd/3rd bout due to fatigue and knees/hips more flexed. Gait velocity: decr     Posture / Balance Dynamic Sitting Balance Sitting balance - Comments: Pt on EOB of air mattress. Pt will have controlled losses of balance with using UEs for fuctional tasks, but catches self with forearms. Cues to recruit abd muscles for upright sitting and pt agreed to trying, but still with lateral losses of  balance. Balance Overall balance assessment: Needs assistance Sitting-balance support: Feet supported Sitting balance-Leahy Scale: Fair Sitting balance - Comments: Pt on EOB of air mattress. Pt will have controlled losses of balance with using UEs for fuctional tasks, but catches self with forearms. Cues to recruit abd muscles for upright sitting and pt agreed to trying, but still with lateral losses of balance. Postural control: Right lateral lean,Left lateral lean Standing balance support: No upper extremity supported,During functional  activity Standing balance-Leahy Scale: Fair Standing balance comment: can manage clothing without UE support     Special needs/care consideration Continuous Drip IV  ampicillin and Special service needs neuropsych    Previous Home Environment (from acute therapy documentation) Living Arrangements: Spouse/significant other Available Help at Discharge: Family,Available 24 hours/day Type of Home: [place]: Two level,Able to live on main level with bedroom/bathroom,Full bath on main level Home Access: Stairs to enter,Ramped entrance Entrance Stairs-Rails: Right Entrance Stairs-Number of Steps: 3 Bathroom Shower/Tub: Multimedia programmer: Standard Home Care Services: Yes Type of Home Care Services: Home OT,Home PT Additional Comments: Has an adjustable bed   Discharge Living Setting Plans for Discharge Living Setting: Patient's home,Lives with (comment) (wife) Type of Home at Discharge: House Discharge Home Layout: One level Discharge Home Access: [place] entrance Discharge Bathroom Shower/Tub: Walk-in shower Discharge Bathroom Toilet: Standard Discharge [place] Accessibility: Yes How Accessible: Accessible via walker (rollator) Does the patient have any problems obtaining your medications?: No   Social/Family/Support Systems Patient Roles: Spouse Anticipated Caregiver: Zeric Baranowski Anticipated Caregiver's Contact Information:  [phone] Ability/Limitations of Caregiver: in remission from cancer as well, cannot sustain more than min assist for ADLs and supervision for mobility Caregiver Availability: 24/7 Discharge Plan Discussed with Primary Caregiver: Yes Is Caregiver In Agreement with Plan?: Yes Does Caregiver/Family have Issues with Lodging/Transportation while Pt is in Rehab?: No   Goals Patient/Family Goal for Rehab: PT/OT mod I to min assist dependent on fatigue Expected length of stay: 6-9 days Pt/Family Agrees to Admission and willing to participate: Yes Program Orientation Provided & Reviewed with Pt/Caregiver Including Roles  & Responsibilities: Yes  Barriers to Discharge: Insurance for SNF coverage   Decrease burden of Care through IP rehab admission: Patient/family education   Possible need for SNF placement upon discharge: not anticipated   Patient Condition: I have reviewed medical records from [facility], spoken with CM, and patient and spouse. I discussed via phone for inpatient rehabilitation assessment.  Patient will benefit from ongoing PT and OT, can actively participate in 3 hours of therapy a day 5 days of the week, and can make measurable gains during the admission.  Patient will also benefit from the coordinated team approach during an Inpatient Acute Rehabilitation admission.  The patient will receive intensive therapy as well as Rehabilitation physician, nursing, social worker, and care management interventions.  Due to bladder management, bowel management, safety, skin/wound care, disease management, medication administration, pain management and patient education the patient requires 24 hour a day rehabilitation nursing.  The patient is currently min assist with mobility and basic ADLs.  Discharge setting and therapy post discharge at home with home health is anticipated.  Patient has agreed to participate in the Acute Inpatient Rehabilitation Program and will admit today.   Preadmission  Screen Completed By:  Bonnee Quin, DPT 03/10/2021 10:08 AM ______________________________________________________________________   Discussed status with Dr. Posey Pronto on 03/10/21  at 10:08 AM  and received approval for admission today.   Admission Coordinator:  Michel Santee, PT, DPT time 10:08 AM Sudie Grumbling 03/10/21     Assessment/Plan: Diagnosis: Debility 1. Does the need for close, 24 hr/day Medical supervision in concert with the patient's rehab needs make it unreasonable for this patient to be served in a less intensive setting? Yes 2. Co-Morbidities requiring supervision/potential complications: CAD, HTN (monitor and provide prns in accordance with increased physical exertion and pain), MI, and diagnosis of multiple myeloma in August 2021 3. Due to safety, disease management  and patient education, does the patient require 24 hr/day rehab nursing? Yes 4. Does the patient require coordinated care of a physician, rehab nurse, PT, OT to address physical and functional deficits in the context of the above medical diagnosis(es)? Yes Addressing deficits in the following areas: balance, endurance, locomotion, strength, transferring, bathing, dressing, toileting and psychosocial support 5. Can the patient actively participate in an intensive therapy program of at least 3 hrs of therapy 5 days a week? Yes 6. The potential for patient to make measurable gains while on inpatient rehab is excellent 7. Anticipated functional outcomes upon discharge from inpatient rehab: modified independent and supervision PT, modified independent and supervision OT, n/a SLP 8. Estimated rehab length of stay to reach the above functional goals is: 7-10 days. 9. Anticipated discharge destination: Home 10. Overall Rehab/Functional Prognosis: good     MD Signature: Delice Lesch, MD, ABPMR

## 2021-03-10 NOTE — H&P (Signed)
Physical Medicine and Rehabilitation Admission H&P    CC: Functional deficits.    HPI: Bruce Smith is a 70 year old male with history of HTN, esophageal stricture, MM with extensive disease and pathologic fractures left scapular and left femur who underwent ASCT at [facility] on 01/22/2021. [id number] was admitted via MD office on 02/23/21 with controlled fall, weakness, poor appetite and N/V with inability to keep down anything but water. He was started on fluids for hydration and Dr. Rosalie Gums consulted for input. 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 He was found to have enterococcus UTI and started on Unasyn briefly.    Palliative care consulted to help manage cancer related BLE pain and he was started on fentanyl with dilaudid but developed increase in confusion. He continued to have difficulty with intake and receive TPN from 05/05 -05/09. He was transitioned to NSAIDs and narcotics d/c with resolution of confusion. UA/UCS rechecked on 05/06 revealing 40,000 colonies of enterococcus UTI and was started on IV ampicillin 05/09 transitioned to amoxicillin today with recommendations to continue was resumed with recommendations to complete 5 total day treatment. Intake has improved with improvement in activity tolerance but he continues to be limited by weakness with fatigue, SOB with activity and poor posture with balance deficits. CIR recommended due to decline.   Please see preadmission assessment from earlier today as well.   Review of Systems  Constitutional: Positive for malaise/fatigue. Negative for chills and fever.  HENT: Negative for hearing loss and tinnitus.   Eyes: Negative for blurred vision and double vision.  Respiratory: Positive for shortness of breath (with activity).   Cardiovascular: Negative for chest pain, palpitations and leg swelling.  Gastrointestinal: Positive for diarrhea. Negative for heartburn, nausea and  vomiting.  Genitourinary: Negative for dysuria and urgency.  Musculoskeletal: Positive for falls and myalgias.  Skin: Negative for itching and rash.  Neurological: Positive for dizziness, sensory change and weakness. Negative for headaches.  Psychiatric/Behavioral: The patient does not have insomnia.   All other systems reviewed and are negative.    Past Medical History:  Diagnosis Date  . Atherosclerosis   . Coronary artery disease   . Goals of care, counseling/discussion 06/29/2020  . 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 Hyperlipidemia   . Hypertension   . 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 Multiple myeloma ([place]) 07/21/2020  . Myocardial infarction [facility])     Past Surgical History:  Procedure Laterality Date  . ANTERIOR CERVICAL DECOMP/DISCECTOMY FUSION N/A 06/17/2020   Procedure: ANTERIOR CERVICAL DECOMPRESSION/DISCECTOMY FUSION, INTERBODY PROSTHESIS, PLATE/SCREWS CERVICAL FIVE- CERVICAL SIX;  Surgeon: Newman Pies, MD;  Location: [facility];  Service: Neurosurgery;  Laterality: N/A;  ANTERIOR CERVICAL DECOMPRESSION/DISCECTOMY FUSION, INTERBODY PROSTHESIS, PLATE/SCREWS CERVICAL FIVE- CERVICAL SIX  . BIOPSY  02/24/2021   Procedure: BIOPSY;  Surgeon: Otis Brace, MD;  Location: [facility];  Service: Gastroenterology;;  . CARDIAC CATHETERIZATION    . ESOPHAGOGASTRODUODENOSCOPY N/A 02/24/2021   Procedure: ESOPHAGOGASTRODUODENOSCOPY (EGD);  Surgeon: Otis Brace, MD;  Location: [facility];  Service: Gastroenterology;  Laterality: N/A;  . FEMUR IM NAIL Left 07/14/2020   Procedure: OPEN REDUCTION INTERNAL FIXATION (ORIF LEFT FEMORAL NAIL FRACTURE;  Surgeon: Marchia Bond, MD;  Location: [place];  Service: Orthopedics;  Laterality: Left;    Family History  Problem Relation Age of Onset  . Stroke Mother   . Prostate cancer Paternal Grandfather      Social History:  Married. Wife works from home.  Sister has been assisting for past few months  as patient has been limited due to chemo/weakness. He  reports that he quit smoking about 22 years ago. His smokeless tobacco use includes chew--quit fall 2021. He uses alcohol on rare occasion. He reports that he does not use drugs.    Allergies: No Known Allergies    Medications Prior to Admission  Medication Sig Dispense Refill  . acyclovir (ZOVIRAX) 400 MG tablet Take 400 mg by mouth 2 (two) times daily.    Marland Kitchen amoxicillin (AMOXIL) 500 MG capsule Take 1 capsule (500 mg total) by mouth every 8 (eight) hours for 9 doses. 9 capsule 0  . celecoxib (CELEBREX) 200 MG capsule Take 1 capsule (200 mg total) by mouth 2 (two) times daily. 60 capsule 2  . ondansetron (ZOFRAN) 8 MG tablet TAKE 1 TABLET BY MOUTH EVERY 8 HOURS AS NEEDED NAUSEA AND VOMITING (Patient taking differently: Take 8 mg by mouth every 8 (eight) hours as needed for nausea.) 20 tablet 1  . pantoprazole (PROTONIX) 40 MG tablet Take 1 tablet (40 mg total) by mouth 2 (two) times daily before a meal. 60 tablet 3  . pregabalin (LYRICA) 100 MG capsule Take 1 capsule (100 mg total) by mouth daily. 60 capsule 0  . pregabalin (LYRICA) 150 MG capsule Take 150 mg by mouth at bedtime.    . tamsulosin (FLOMAX) 0.4 MG CAPS capsule Take 0.4 mg by mouth daily.      Drug Regimen Review  Drug regimen was reviewed and remains appropriate with no significant issues identified  Home: Home Living Family/patient expects to be discharged to:: Private residence Living Arrangements: Spouse/significant other Available Help at Discharge: Family,Available 24 hours/day Type of Home: House Home Access: Stairs to enter,Ramped entrance Entrance Stairs-Number of Steps: 3 Entrance Stairs-Rails: Right Home Layout: Two level,Able to live on main level with bedroom/bathroom,Full bath on main level Bathroom Shower/Tub: Multimedia programmer: Standard Home Equipment: Civil engineer, contracting - built in,Hand held Estate agent - 4 wheels,Bedside commode,Grab bars  - tub/shower,Grab bars - toilet,Walker - 2 wheels,Cane - single point Additional Comments: Has an adjustable bed   Functional History: Prior Function Level of Independence: Needs assistance Gait / Transfers Assistance Needed: ambulating with walker when has enough strength ADL's / Homemaking Assistance Needed: family assisting with dressing, bathing, toileting as patient's strength wax and weans  Functional Status:  Mobility: Bed Mobility Overal bed mobility: Needs Assistance Bed Mobility: Sit to Supine,Supine to Sit Supine to sit: Supervision,HOB elevated Sit to supine: Min guard General bed mobility comments: supervision/guarding for spine<>sit EOB, cues to avoid use of elevated HOB an dpt able to press trunk up with bil UE's. Transfers Overall transfer level: Needs assistance Equipment used: Rolling walker (2 wheeled) Transfers: Sit to/from Stand Sit to Stand: Supervision Stand pivot transfers: Min assist General transfer comment: with increased time patient able to power up from toilet with use of grab bar Ambulation/Gait Ambulation/Gait assistance: Min assist,Min guard Gait Distance (Feet): 75 Feet (25, 20, 30) Assistive device: Rolling walker (2 wheeled) Gait Pattern/deviations: Step-through pattern,Decreased stride length,Shuffle General Gait Details: pt amb 3x short bouts of gait in hallway with seated rest for recovery 2/2 Lt>Rt LE pain. Patient required min guard on first bout and assist during 2nd/3rd bout due to fatigue and knees/hips more flexed. Gait velocity: decr    ADL: ADL Overall ADL's : Needs assistance/impaired Grooming: Oral care,Wash/dry face,Wash/dry hands,Set up,Sitting Grooming Details (indicate cue type and reason): patient reports shortness of breath from ADLs  in bathroom, request to sit in front of sink for grooming/hygiene Upper Body Bathing: Set up,Sitting Lower Body Bathing: Moderate assistance,Sitting/lateral leans,Sit to/from stand Lower Body  Bathing Details (indicate cue type and reason): Pt educated on use of long handled bath sponge/brush to reach LEs while seated in shower to avoid single leg stance. Upper Body Dressing : Set up,Sitting Lower Body Dressing: Moderate assistance,Sitting/lateral leans,Sit to/from stand Lower Body Dressing Details (indicate cue type and reason): seated EOB patient able to doff sock with increased time/effort and use of reacher.  Pt educated on use of sock aid and attempted standard size but it was too narrow. Pt used extra-wide aid and able to place sock on aid after instruction and on to RT foot only.  Held on LT foot due to pain. Pt educated on use of reacher to don dipaer brief. Pt declined to practice today due to LE pain. OT demonstrated process to him and pt verbalized understanding. Toilet Transfer: Supervision/safety,Ambulation,Regular Toilet,RW,Grab TEFL teacher Details (indicate cue type and reason): patient at first requesting to use urinal, educate patient on importance of increasing out of bed activity foif ultimate goal is independence at home. patient verbalize understanding, overall supervision level with walker to ambulate to/from bathroom and to transfer on/off toilet Toileting- Clothing Manipulation and Hygiene: Supervision/safety,Sitting/lateral lean,Sit to/from stand Toileting - Clothing Manipulation Details (indicate cue type and reason): patient sat quickly onto toilet before lowering brief, cue patient to stand and encouraged patient to pull down himself. Able to manage brief and perianal care after bowel movement without physical assistance Functional mobility during ADLs: Supervision/safety,Rolling walker General ADL Comments: patient presents with decreased overall activity tolerance and shortness of breath with ADL activity impacting independence with self care.  Cognition: Cognition Overall Cognitive Status: Within Functional Limits for tasks assessed Orientation Level:  Oriented X4 Cognition Arousal/Alertness: Awake/alert Behavior During Therapy: WFL for tasks assessed/performed Overall Cognitive Status: Within Functional Limits for tasks assessed General Comments: Anxious when attempting to take a step.   Blood pressure 122/71, pulse 86, temperature 98.9 F (37.2 C), temperature source Oral, resp. rate 16, SpO2 99 %. Physical Exam Vitals and nursing note reviewed.  Constitutional:      General: He is not in acute distress.    Appearance: Normal appearance. He is normal weight. He is not ill-appearing.  HENT:     Head: Normocephalic and atraumatic.     Right Ear: External ear normal.     Left Ear: External ear normal.     Nose: Nose normal.  Eyes:     General:        Right eye: No discharge.        Left eye: No discharge.     Extraocular Movements: Extraocular movements intact.  Cardiovascular:     Rate and Rhythm: Normal rate and regular rhythm.  Pulmonary:     Effort: Pulmonary effort is normal. No respiratory distress.     Breath sounds: No stridor.  Abdominal:     General: Abdomen is flat. Bowel sounds are normal. There is no distension.  Musculoskeletal:        General: Swelling (mild pedal edema. ) present. No tenderness.     Cervical back: Normal range of motion and neck supple.     Comments: Bilateral heels boggy. Left great toe red and swollen--mildly tender to touch.   Skin:    General: Skin is warm and dry.  Neurological:     Mental Status: He is alert and oriented to person,  place, and time.     Comments: Alert Sensation diminished to light touch distal to b/l knees Motor: B/l UE: 5/5 proximal to distal B/l LE: HF, KE 2+/5, ADF 4/5  Psychiatric:        Mood and Affect: Mood normal.        Behavior: Behavior normal.     Results for orders placed or performed during the hospital encounter of 02/23/21 (from the past 48 hour(s))  Glucose, capillary     Status: None   Collection Time: 03/08/21  4:46 PM  Result Value Ref  Range   Glucose-Capillary 90 70 - 99 mg/dL    Comment: Glucose reference range applies only to samples taken after fasting for at least 8 hours.  Glucose, capillary     Status: None   Collection Time: 03/08/21  9:08 PM  Result Value Ref Range   Glucose-Capillary 91 70 - 99 mg/dL    Comment: Glucose reference range applies only to samples taken after fasting for at least 8 hours.  Comprehensive metabolic panel     Status: Abnormal   Collection Time: 03/09/21  5:05 AM  Result Value Ref Range   Sodium 137 135 - 145 mmol/L   Potassium 4.0 3.5 - 5.1 mmol/L   Chloride 112 (H) 98 - 111 mmol/L   CO2 22 22 - 32 mmol/L   Glucose, Bld 89 70 - 99 mg/dL    Comment: Glucose reference range applies only to samples taken after fasting for at least 8 hours.   BUN 10 8 - 23 mg/dL   Creatinine, Ser 0.34 (L) 0.61 - 1.24 mg/dL   Calcium 7.7 (L) 8.9 - 10.3 mg/dL   Total Protein 4.1 (L) 6.5 - 8.1 g/dL   Albumin 2.2 (L) 3.5 - 5.0 g/dL   AST 15 15 - 41 U/L   ALT 12 0 - 44 U/L   Alkaline Phosphatase 81 38 - 126 U/L   Total Bilirubin 0.2 (L) 0.3 - 1.2 mg/dL   GFR, Estimated >60 >60 mL/min    Comment: (NOTE) Calculated using the CKD-EPI Creatinine Equation (2021)    Anion gap 3 (L) 5 - 15    Comment: Performed at [facility], [place] [address], [place], [place] [address]  CBC with Differential/Platelet     Status: Abnormal   Collection Time: 03/09/21  5:05 AM  Result Value Ref Range   WBC 6.7 4.0 - 10.5 K/uL   RBC 3.15 (L) 4.22 - 5.81 MIL/uL   Hemoglobin 9.9 (L) 13.0 - 17.0 g/dL   HCT 29.9 (L) 39.0 - 52.0 %   MCV 94.9 80.0 - 100.0 fL   MCH 31.4 26.0 - 34.0 pg   MCHC 33.1 30.0 - 36.0 g/dL   RDW 15.8 (H) 11.5 - 15.5 %   Platelets 190 150 - 400 K/uL   nRBC 0.0 0.0 - 0.2 %   Neutrophils Relative % 73 %   Neutro Abs 4.9 1.7 - 7.7 K/uL   Lymphocytes Relative 13 %   Lymphs Abs 0.9 0.7 - 4.0 K/uL   Monocytes Relative 11 %   Monocytes Absolute 0.7 0.1 - 1.0 K/uL   Eosinophils Relative  2 %   Eosinophils Absolute 0.1 0.0 - 0.5 K/uL   Basophils Relative 0 %   Basophils Absolute 0.0 0.0 - 0.1 K/uL   Immature Granulocytes 1 %   Abs Immature Granulocytes 0.07 0.00 - 0.07 K/uL    Comment: Performed at [facility], [place] [address] [place], [place] [address]  Magnesium     Status: None   Collection Time: 03/09/21  5:05 AM  Result Value Ref Range   Magnesium 1.8 1.7 - 2.4 mg/dL    Comment: Performed at [facility], [place] [address], [place], [place] [address]  CBC with Differential/Platelet     Status: Abnormal   Collection Time: 03/10/21  5:38 AM  Result Value Ref Range   WBC 5.1 4.0 - 10.5 K/uL   RBC 3.15 (L) 4.22 - 5.81 MIL/uL   Hemoglobin 9.8 (L) 13.0 - 17.0 g/dL   HCT 30.1 (L) 39.0 - 52.0 %   MCV 95.6 80.0 - 100.0 fL   MCH 31.1 26.0 - 34.0 pg   MCHC 32.6 30.0 - 36.0 g/dL   RDW 16.0 (H) 11.5 - 15.5 %   Platelets 182 150 - 400 K/uL   nRBC 0.0 0.0 - 0.2 %   Neutrophils Relative % 61 %   Neutro Abs 3.1 1.7 - 7.7 K/uL   Lymphocytes Relative 23 %   Lymphs Abs 1.2 0.7 - 4.0 K/uL   Monocytes Relative 13 %   Monocytes Absolute 0.7 0.1 - 1.0 K/uL   Eosinophils Relative 2 %   Eosinophils Absolute 0.1 0.0 - 0.5 K/uL   Basophils Relative 0 %   Basophils Absolute 0.0 0.0 - 0.1 K/uL   Immature Granulocytes 1 %   Abs Immature Granulocytes 0.05 0.00 - 0.07 K/uL    Comment: Performed at [facility], [place] [address], [place], [place] [address]  Magnesium     Status: None   Collection Time: 03/10/21  5:38 AM  Result Value Ref Range   Magnesium 1.7 1.7 - 2.4 mg/dL    Comment: Performed at [facility], [place] [address] [place], [place] [address]   No results found.     Medical Problem List and Plan: 1.  Weakness with fatigue, SOB with activity and poor posture with balance secondary to debility.  -patient may shower  -ELOS/Goals: Supervision/Mod I/8-13 days.  Admit to CIR 2.   Antithrombotics: -DVT/anticoagulation:  Pharmaceutical: Lovenox as thrombocytopenia ha resolved.   -antiplatelet therapy: N/A 3. Pain Management: Celebrex bid with tylenol prn.  Monitor with increased exertion  4. Mood: LCSW to follow for evaluation and support.   -antipsychotic agents: N/A 5. Neuropsych: This patient is capable of making decisions on his own behalf. 6. Skin/Wound Care: Routine pressure relief measures.  7. Fluids/Electrolytes/Nutrition: Monitor I/Os.    CMP ordered for tomorrow 8. FTT: Has had issues with N//V/Malaise since last fall due to chemo  --offer supplements between meals  --monitor for orthostatic symptoms.  --recheck Magnesium levels in am.  9. Enterococcus UTI: On amoxacillin for 2 additional days. 10. Anemia of chronic disease:   CBC ordered for tomorrow AM 11. 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 HTN: Monitor BP tid--check orthostatic BP  Monitor with increased mobility 13. SOB: Encourage pulmonary hygiene.  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 Question gout flare v/s ingrown toe nail.   Increase meds as necessary  Bary Leriche, PA-C 03/10/2021  I have personally performed a face to face diagnostic evaluation, including, but not limited to relevant history and physical exam findings, of this patient and developed relevant assessment and plan.  Additionally, I have reviewed and concur with the physician assistant's documentation above.  Delice Lesch, MD, ABPMR  The patient's status has not changed. Any changes from the pre-admission screening or documentation  from the acute chart are noted above.   Delice Lesch, MD, ABPMR

## 2021-03-10 NOTE — Discharge Summary (Signed)
@LOGO @ Hematology and Oncology Follow Up Visit  Bruce Smith [id number] September 28, 1951 70 y.o. 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     Interim History:  Bruce Smith is discharged from the hospital today.  He actually is going to be moved over to the CIR at [facility] for rehabilitation.  He was admitted on 02/23/2021.  He was admitted because of severe malnutrition.  He was not eating.  He was having severe nausea.  He was dehydrated.  He was admitted to 6 E. at [facility].  He had very poor IV access.  We had a PICC line placed to him.  He has severe electrolyte abnormalities.  He was given IV fluids.  He was giving chronic replacements of calcium, potassium, and magnesium.  He was having some diarrhea.  We did find that he had a Enterococcus urinary tract infection.  This was sensitive to all antibiotics.  We had him on IV ampicillin in the hospital.  As an outpatient should, he will be on Augmentin.  He got Zofran to help with nausea.  Has helped a little bit.  We ultimately had to give him some TNA to help with nutritional state.  Pharmacy helped with this.  He was having problems with chronic pain.  We had a hard time controlling this at home.  We had him on fentanyl, Dilaudid, and Ativan.  Unfortunately, he became quite disoriented.  This happened about a week into the hospitalization.  He clearly had signs of narcotic toxicity.  We stopped all of his narcotics.  In a day or 2, he began to improve nicely.  His mental state got back to baseline.  He was able to eat  better.  He had no nausea.  He was quite anemic at 1 point.  We did give him a unit of blood which helped a little bit.  He was able to do physical therapy and Occupational Therapy.  Physical therapy thought that it would be a great idea to see if he would qualify for [facility] inpatient rehab.  As such, he was assessed for CIR.  He was approved for this.  We have subsequently get him over to CIR.  He is eating better.  His family is bringing food in for him.  He has had no fever.  He has had no rashes.  He has had no mouth sores.  He is still having some recovery from the stem cell transplant for his myeloma.  This was a couple months ago.  He is on acyclovir for this as a prophylactic measure.  I would have to say that overall, his performance status is ECOG 1.  Medications:  Current Facility-Administered Medications:  .  acetaminophen (TYLENOL) tablet 1,000 mg, 1,000 mg, Oral, Q6H PRN, Micheline Rough, MD, 1,000 mg at 03/09/21 1147 .  acyclovir (ZOVIRAX) 200 MG capsule 400 mg, 400 mg, Oral, BID, Volanda Napoleon, MD, 400 mg at 03/09/21 2122 .  amoxicillin-clavulanate (AUGMENTIN) 875-125 MG per tablet 1 tablet, 1 tablet, Oral, Q12H, Lalah Durango, Rudell Cobb, MD .  celecoxib (CELEBREX) capsule 200 mg, 200 mg, Oral, BID, Volanda Napoleon, MD, 200 mg at 03/09/21  2123 .  loperamide (IMODIUM) capsule 2 mg, 2 mg, Oral, PRN, Iruku, Praveena, MD, 2 mg at 03/07/21 1123 .  ondansetron (ZOFRAN) tablet 8 mg, 8 mg, Oral, Q8H PRN, Johnryan Sao, Rudell Cobb, MD .  pantoprazole (PROTONIX) EC tablet 40 mg, 40 mg, Oral, BID AC, Volanda Napoleon, MD, 40 mg at 03/09/21 1634 .  pregabalin (LYRICA) capsule 100 mg, 100 mg, Oral, Daily, Domingo Cocking, Gene, MD, 100 mg at 03/09/21 0945 .  pregabalin (LYRICA) capsule 150 mg, 150 mg, Oral, QHS, Domingo Cocking, Gene, MD, 150 mg at 03/09/21 2123 .  tamsulosin (FLOMAX) capsule 0.4 mg, 0.4 mg, Oral, QHS, Curvin Hunger, Rudell Cobb, MD, 0.4 mg at 03/09/21 2123  Allergies: No Known Allergies  Past Medical History,  Surgical history, Social history, and Family History were reviewed and updated.  Review of Systems: Review of Systems  Constitutional: Negative.   HENT:  Negative.   Eyes: Negative.   Respiratory: Negative.   Cardiovascular: Negative.   Endocrine: Negative.   Genitourinary: Negative.    Musculoskeletal: Positive for arthralgias.  Skin: Negative.   Neurological: Negative.   Hematological: Negative.   Psychiatric/Behavioral: Negative.     Physical Exam:  oral temperature is 98.9 F (37.2 C). His blood pressure is 122/71 and his pulse is 86. His respiration is 16 and oxygen saturation is 99%.   Wt Readings from Last 3 Encounters:  12/09/20 158 lb (71.7 kg)  12/03/20 162 lb (73.5 kg)  11/03/20 169 lb (76.7 kg)    Physical Exam Vitals reviewed.  HENT:     Head: Normocephalic and atraumatic.  Eyes:     Pupils: Pupils are equal, round, and reactive to light.  Cardiovascular:     Rate and Rhythm: Normal rate and regular rhythm.     Heart sounds: Normal heart sounds.  Pulmonary:     Effort: Pulmonary effort is normal.     Breath sounds: Normal breath sounds.  Abdominal:     General: Bowel sounds are normal.     Palpations: Abdomen is soft.  Musculoskeletal:        General: No tenderness or deformity. Normal range of motion.     Cervical back: Normal range of motion.  Lymphadenopathy:     Cervical: No cervical adenopathy.  Skin:    General: Skin is warm and dry.     Findings: No erythema or rash.  Neurological:     Mental Status: He is alert and oriented to person, place, and time.  Psychiatric:        Behavior: Behavior normal.        Thought Content: Thought content normal.        Judgment: Judgment normal.      Lab Results  Component Value Date   WBC 5.1 03/10/2021   HGB 9.8 (L) 03/10/2021   HCT 30.1 (L) 03/10/2021   MCV 95.6 03/10/2021   PLT 182 03/10/2021   @LASTCHEMISTRY @    Impression and Plan: Bruce Smith is being discharged now from was on  hospital.  We will plan to see him over in the [place] [facility].  He is in much better shape than when he came into the hospital.  I think stopping the narcotics really helped him out.  His mental status is much better.  He has 3 days of therapy for the Enterococcus urinary tract infection.  His labs will have to be checked for his electrolytes.  Hopefully, he will be able to do his physical therapy and be able to be  more functional when he goes home.  This is essentially a discharge summary for [name].   Volanda Napoleon, MD 5/11/20227:24 AM

## 2021-03-10 NOTE — Progress Notes (Signed)
Patient ID: Bruce Smith, male   DOB: 11/01/1950, 69 y.o.   MRN: [id number]  Met with patient, patient's spouse, and sister in patient room. Introduce myself and explained my role in his care. I gave additional educational materials for HTN, HLD, heart healthy diet, cooking with less salt, and coronary artery disease. I explained the current pain medication he is on and answered to the best of my knowledge why he was no longer on the Toradol. The wife and sister asked if I would put in for an additional Palliative consult so they could continue with the pain management. I told them that I would. The air mattress was sitting outside the room and staff was going to change him over as soon as they could. I advised them to speak with Dr. Lovorn in the morning concerning the timing of the Flomax, he would prefer to take it during the morning. I will continue to monitor the patient's progress.    , RN3, BSN, CBIS, CRRN, WTA Care Coordinator, Inpatient Rehabilitation Office [phone] Cell [phone] 

## 2021-03-10 NOTE — Progress Notes (Signed)
Inpatient Rehabilitation Medication Review by a Pharmacist  A complete drug regimen review was completed for this patient to identify any potential clinically significant medication issues.  Clinically significant medication issues were identified:  no  Check AMION for pharmacist assigned to patient if future medication questions/issues arise during this admission.  Pharmacist comments: n/a  Time spent performing this drug regimen review (minutes):  10 minutes   Gillian Scarce, PharmD 03/10/2021 3:27 PM

## 2021-03-10 NOTE — Progress Notes (Signed)
Inpatient Rehab Admissions Coordinator:   Planning to admit pt to CIR today. Dr. [name] in agreement.  Will let pt/family and TOC know.  I will call and arrange transport with [facility] as soon as we have a ready room on CIR.    Shann Medal, PT, DPT Admissions Coordinator [phone] 03/10/21  10:01 AM

## 2021-03-10 NOTE — H&P (Signed)
Physical Medicine and Rehabilitation Admission H&P    CC: Functional deficits.    HPI: Bruce Smith is a 70 year old male with history of HTN, esophageal stricture, MM with extensive disease and pathologic fractures left scapular and left femur who underwent ASCT at [facility] on 01/22/2021. [id number] was admitted via MD office on 02/23/21 with controlled fall, weakness, poor appetite and N/V with inability to keep down anything but water. He was started on fluids for hydration and Dr. Rosalie Gums consulted for input. 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 He was found to have enterococcus UTI and started on Unasyn briefly.    Palliative care consulted to help manage cancer related BLE pain and he was started on fentanyl with dilaudid but developed increase in confusion. He continued to have difficulty with intake and receive TPN from 05/05 -05/09. He was transitioned to NSAIDs and narcotics d/c with resolution of confusion. UA/UCS rechecked on 05/06 revealing 40,000 colonies of enterococcus UTI and was started on IV ampicillin 05/09 transitioned to amoxicillin today with recommendations to continue was resumed with recommendations to complete 5 total day treatment. Intake has improved with improvement in activity tolerance but he continues to be limited by weakness with fatigue, SOB with activity and poor posture with balance deficits. CIR recommended due to decline.   Please see preadmission assessment from earlier today as well.   Review of Systems  Constitutional: Positive for malaise/fatigue. Negative for chills and fever.  HENT: Negative for hearing loss and tinnitus.   Eyes: Negative for blurred vision and double vision.  Respiratory: Positive for shortness of breath (with activity).   Cardiovascular: Negative for chest pain, palpitations and leg swelling.  Gastrointestinal: Positive for diarrhea. Negative for heartburn, nausea and  vomiting.  Genitourinary: Negative for dysuria and urgency.  Musculoskeletal: Positive for falls and myalgias.  Skin: Negative for itching and rash.  Neurological: Positive for dizziness, sensory change and weakness. Negative for headaches.  Psychiatric/Behavioral: The patient does not have insomnia.   All other systems reviewed and are negative.    Past Medical History:  Diagnosis Date  . Atherosclerosis   . Coronary artery disease   . Goals of care, counseling/discussion 06/29/2020  . 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 Hyperlipidemia   . Hypertension   . 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 Multiple myeloma ([place]) 07/21/2020  . Myocardial infarction [facility])     Past Surgical History:  Procedure Laterality Date  . ANTERIOR CERVICAL DECOMP/DISCECTOMY FUSION N/A 06/17/2020   Procedure: ANTERIOR CERVICAL DECOMPRESSION/DISCECTOMY FUSION, INTERBODY PROSTHESIS, PLATE/SCREWS CERVICAL FIVE- CERVICAL SIX;  Surgeon: Newman Pies, MD;  Location: [facility];  Service: Neurosurgery;  Laterality: N/A;  ANTERIOR CERVICAL DECOMPRESSION/DISCECTOMY FUSION, INTERBODY PROSTHESIS, PLATE/SCREWS CERVICAL FIVE- CERVICAL SIX  . BIOPSY  02/24/2021   Procedure: BIOPSY;  Surgeon: Otis Brace, MD;  Location: [facility];  Service: Gastroenterology;;  . CARDIAC CATHETERIZATION    . ESOPHAGOGASTRODUODENOSCOPY N/A 02/24/2021   Procedure: ESOPHAGOGASTRODUODENOSCOPY (EGD);  Surgeon: Otis Brace, MD;  Location: [facility];  Service: Gastroenterology;  Laterality: N/A;  . FEMUR IM NAIL Left 07/14/2020   Procedure: OPEN REDUCTION INTERNAL FIXATION (ORIF LEFT FEMORAL NAIL FRACTURE;  Surgeon: Marchia Bond, MD;  Location: [place];  Service: Orthopedics;  Laterality: Left;    Family History  Problem Relation Age of Onset  . Stroke Mother   . Prostate cancer Paternal Grandfather      Social History:  Married. Wife works from home.  Sister has been assisting for past few months  as patient has been limited due to chemo/weakness. He  reports that he quit smoking about 22 years ago. His smokeless tobacco use includes chew--quit fall 2021. He uses alcohol on rare occasion. He reports that he does not use drugs.    Allergies: No Known Allergies    Medications Prior to Admission  Medication Sig Dispense Refill  . acyclovir (ZOVIRAX) 400 MG tablet Take 400 mg by mouth 2 (two) times daily.    Marland Kitchen amoxicillin (AMOXIL) 500 MG capsule Take 1 capsule (500 mg total) by mouth every 8 (eight) hours for 9 doses. 9 capsule 0  . celecoxib (CELEBREX) 200 MG capsule Take 1 capsule (200 mg total) by mouth 2 (two) times daily. 60 capsule 2  . ondansetron (ZOFRAN) 8 MG tablet TAKE 1 TABLET BY MOUTH EVERY 8 HOURS AS NEEDED NAUSEA AND VOMITING (Patient taking differently: Take 8 mg by mouth every 8 (eight) hours as needed for nausea.) 20 tablet 1  . pantoprazole (PROTONIX) 40 MG tablet Take 1 tablet (40 mg total) by mouth 2 (two) times daily before a meal. 60 tablet 3  . pregabalin (LYRICA) 100 MG capsule Take 1 capsule (100 mg total) by mouth daily. 60 capsule 0  . pregabalin (LYRICA) 150 MG capsule Take 150 mg by mouth at bedtime.    . tamsulosin (FLOMAX) 0.4 MG CAPS capsule Take 0.4 mg by mouth daily.      Drug Regimen Review  Drug regimen was reviewed and remains appropriate with no significant issues identified  Home: Home Living Family/patient expects to be discharged to:: Private residence Living Arrangements: Spouse/significant other Available Help at Discharge: Family,Available 24 hours/day Type of Home: House Home Access: Stairs to enter,Ramped entrance Entrance Stairs-Number of Steps: 3 Entrance Stairs-Rails: Right Home Layout: Two level,Able to live on main level with bedroom/bathroom,Full bath on main level Bathroom Shower/Tub: Multimedia programmer: Standard Home Equipment: Civil engineer, contracting - built in,Hand held Estate agent - 4 wheels,Bedside commode,Grab bars  - tub/shower,Grab bars - toilet,Walker - 2 wheels,Cane - single point Additional Comments: Has an adjustable bed   Functional History: Prior Function Level of Independence: Needs assistance Gait / Transfers Assistance Needed: ambulating with walker when has enough strength ADL's / Homemaking Assistance Needed: family assisting with dressing, bathing, toileting as patient's strength wax and weans  Functional Status:  Mobility: Bed Mobility Overal bed mobility: Needs Assistance Bed Mobility: Sit to Supine,Supine to Sit Supine to sit: Supervision,HOB elevated Sit to supine: Min guard General bed mobility comments: supervision/guarding for spine<>sit EOB, cues to avoid use of elevated HOB an dpt able to press trunk up with bil UE's. Transfers Overall transfer level: Needs assistance Equipment used: Rolling walker (2 wheeled) Transfers: Sit to/from Stand Sit to Stand: Supervision Stand pivot transfers: Min assist General transfer comment: with increased time patient able to power up from toilet with use of grab bar Ambulation/Gait Ambulation/Gait assistance: Min assist,Min guard Gait Distance (Feet): 75 Feet (25, 20, 30) Assistive device: Rolling walker (2 wheeled) Gait Pattern/deviations: Step-through pattern,Decreased stride length,Shuffle General Gait Details: pt amb 3x short bouts of gait in hallway with seated rest for recovery 2/2 Lt>Rt LE pain. Patient required min guard on first bout and assist during 2nd/3rd bout due to fatigue and knees/hips more flexed. Gait velocity: decr    ADL: ADL Overall ADL's : Needs assistance/impaired Grooming: Oral care,Wash/dry face,Wash/dry hands,Set up,Sitting Grooming Details (indicate cue type and reason): patient reports shortness of breath from ADLs  in bathroom, request to sit in front of sink for grooming/hygiene Upper Body Bathing: Set up,Sitting Lower Body Bathing: Moderate assistance,Sitting/lateral leans,Sit to/from stand Lower Body  Bathing Details (indicate cue type and reason): Pt educated on use of long handled bath sponge/brush to reach LEs while seated in shower to avoid single leg stance. Upper Body Dressing : Set up,Sitting Lower Body Dressing: Moderate assistance,Sitting/lateral leans,Sit to/from stand Lower Body Dressing Details (indicate cue type and reason): seated EOB patient able to doff sock with increased time/effort and use of reacher.  Pt educated on use of sock aid and attempted standard size but it was too narrow. Pt used extra-wide aid and able to place sock on aid after instruction and on to RT foot only.  Held on LT foot due to pain. Pt educated on use of reacher to don dipaer brief. Pt declined to practice today due to LE pain. OT demonstrated process to him and pt verbalized understanding. Toilet Transfer: Supervision/safety,Ambulation,Regular Toilet,RW,Grab TEFL teacher Details (indicate cue type and reason): patient at first requesting to use urinal, educate patient on importance of increasing out of bed activity foif ultimate goal is independence at home. patient verbalize understanding, overall supervision level with walker to ambulate to/from bathroom and to transfer on/off toilet Toileting- Clothing Manipulation and Hygiene: Supervision/safety,Sitting/lateral lean,Sit to/from stand Toileting - Clothing Manipulation Details (indicate cue type and reason): patient sat quickly onto toilet before lowering brief, cue patient to stand and encouraged patient to pull down himself. Able to manage brief and perianal care after bowel movement without physical assistance Functional mobility during ADLs: Supervision/safety,Rolling walker General ADL Comments: patient presents with decreased overall activity tolerance and shortness of breath with ADL activity impacting independence with self care.  Cognition: Cognition Overall Cognitive Status: Within Functional Limits for tasks assessed Orientation Level:  Oriented X4 Cognition Arousal/Alertness: Awake/alert Behavior During Therapy: WFL for tasks assessed/performed Overall Cognitive Status: Within Functional Limits for tasks assessed General Comments: Anxious when attempting to take a step.   Blood pressure 122/71, pulse 86, temperature 98.9 F (37.2 C), temperature source Oral, resp. rate 16, SpO2 99 %. Physical Exam Vitals and nursing note reviewed.  Constitutional:      General: He is not in acute distress.    Appearance: Normal appearance. He is normal weight. He is not ill-appearing.  HENT:     Head: Normocephalic and atraumatic.     Right Ear: External ear normal.     Left Ear: External ear normal.     Nose: Nose normal.  Eyes:     General:        Right eye: No discharge.        Left eye: No discharge.     Extraocular Movements: Extraocular movements intact.  Cardiovascular:     Rate and Rhythm: Normal rate and regular rhythm.  Pulmonary:     Effort: Pulmonary effort is normal. No respiratory distress.     Breath sounds: No stridor.  Abdominal:     General: Abdomen is flat. Bowel sounds are normal. There is no distension.  Musculoskeletal:        General: Swelling (mild pedal edema. ) present. No tenderness.     Cervical back: Normal range of motion and neck supple.     Comments: Bilateral heels boggy. Left great toe red and swollen--mildly tender to touch.   Skin:    General: Skin is warm and dry.  Neurological:     Mental Status: He is alert and oriented to person,  place, and time.     Comments: Alert Sensation diminished to light touch distal to b/l knees Motor: B/l UE: 5/5 proximal to distal B/l LE: HF, KE 2+/5, ADF 4/5  Psychiatric:        Mood and Affect: Mood normal.        Behavior: Behavior normal.     Results for orders placed or performed during the hospital encounter of 02/23/21 (from the past 48 hour(s))  Glucose, capillary     Status: None   Collection Time: 03/08/21  4:46 PM  Result Value Ref  Range   Glucose-Capillary 90 70 - 99 mg/dL    Comment: Glucose reference range applies only to samples taken after fasting for at least 8 hours.  Glucose, capillary     Status: None   Collection Time: 03/08/21  9:08 PM  Result Value Ref Range   Glucose-Capillary 91 70 - 99 mg/dL    Comment: Glucose reference range applies only to samples taken after fasting for at least 8 hours.  Comprehensive metabolic panel     Status: Abnormal   Collection Time: 03/09/21  5:05 AM  Result Value Ref Range   Sodium 137 135 - 145 mmol/L   Potassium 4.0 3.5 - 5.1 mmol/L   Chloride 112 (H) 98 - 111 mmol/L   CO2 22 22 - 32 mmol/L   Glucose, Bld 89 70 - 99 mg/dL    Comment: Glucose reference range applies only to samples taken after fasting for at least 8 hours.   BUN 10 8 - 23 mg/dL   Creatinine, Ser 0.34 (L) 0.61 - 1.24 mg/dL   Calcium 7.7 (L) 8.9 - 10.3 mg/dL   Total Protein 4.1 (L) 6.5 - 8.1 g/dL   Albumin 2.2 (L) 3.5 - 5.0 g/dL   AST 15 15 - 41 U/L   ALT 12 0 - 44 U/L   Alkaline Phosphatase 81 38 - 126 U/L   Total Bilirubin 0.2 (L) 0.3 - 1.2 mg/dL   GFR, Estimated >60 >60 mL/min    Comment: (NOTE) Calculated using the CKD-EPI Creatinine Equation (2021)    Anion gap 3 (L) 5 - 15    Comment: Performed at [facility], [place] [address], [place], [place] [address]  CBC with Differential/Platelet     Status: Abnormal   Collection Time: 03/09/21  5:05 AM  Result Value Ref Range   WBC 6.7 4.0 - 10.5 K/uL   RBC 3.15 (L) 4.22 - 5.81 MIL/uL   Hemoglobin 9.9 (L) 13.0 - 17.0 g/dL   HCT 29.9 (L) 39.0 - 52.0 %   MCV 94.9 80.0 - 100.0 fL   MCH 31.4 26.0 - 34.0 pg   MCHC 33.1 30.0 - 36.0 g/dL   RDW 15.8 (H) 11.5 - 15.5 %   Platelets 190 150 - 400 K/uL   nRBC 0.0 0.0 - 0.2 %   Neutrophils Relative % 73 %   Neutro Abs 4.9 1.7 - 7.7 K/uL   Lymphocytes Relative 13 %   Lymphs Abs 0.9 0.7 - 4.0 K/uL   Monocytes Relative 11 %   Monocytes Absolute 0.7 0.1 - 1.0 K/uL   Eosinophils Relative  2 %   Eosinophils Absolute 0.1 0.0 - 0.5 K/uL   Basophils Relative 0 %   Basophils Absolute 0.0 0.0 - 0.1 K/uL   Immature Granulocytes 1 %   Abs Immature Granulocytes 0.07 0.00 - 0.07 K/uL    Comment: Performed at [facility], [place] [address]  [address] [place], [place] [address]  Magnesium     Status: None   Collection Time: 03/09/21  5:05 AM  Result Value Ref Range   Magnesium 1.8 1.7 - 2.4 mg/dL    Comment: Performed at [facility], [place] [address], [place], [place] [address]  CBC with Differential/Platelet     Status: Abnormal   Collection Time: 03/10/21  5:38 AM  Result Value Ref Range   WBC 5.1 4.0 - 10.5 K/uL   RBC 3.15 (L) 4.22 - 5.81 MIL/uL   Hemoglobin 9.8 (L) 13.0 - 17.0 g/dL   HCT 30.1 (L) 39.0 - 52.0 %   MCV 95.6 80.0 - 100.0 fL   MCH 31.1 26.0 - 34.0 pg   MCHC 32.6 30.0 - 36.0 g/dL   RDW 16.0 (H) 11.5 - 15.5 %   Platelets 182 150 - 400 K/uL   nRBC 0.0 0.0 - 0.2 %   Neutrophils Relative % 61 %   Neutro Abs 3.1 1.7 - 7.7 K/uL   Lymphocytes Relative 23 %   Lymphs Abs 1.2 0.7 - 4.0 K/uL   Monocytes Relative 13 %   Monocytes Absolute 0.7 0.1 - 1.0 K/uL   Eosinophils Relative 2 %   Eosinophils Absolute 0.1 0.0 - 0.5 K/uL   Basophils Relative 0 %   Basophils Absolute 0.0 0.0 - 0.1 K/uL   Immature Granulocytes 1 %   Abs Immature Granulocytes 0.05 0.00 - 0.07 K/uL    Comment: Performed at [facility], [place] [address], [place], [place] [address]  Magnesium     Status: None   Collection Time: 03/10/21  5:38 AM  Result Value Ref Range   Magnesium 1.7 1.7 - 2.4 mg/dL    Comment: Performed at [facility], [place] [address], [place], [place] [address]   No results found.     Medical Problem List and Plan: 1.  Weakness with fatigue, SOB with activity and poor posture with balance secondary to debility.  -patient may shower  -ELOS/Goals: Supervision/Mod I/8-13 days.  Admit to CIR 2.   Antithrombotics: -DVT/anticoagulation:  Pharmaceutical: Lovenox as thrombocytopenia ha resolved.   -antiplatelet therapy: N/A 3. Pain Management: Celebrex bid with tylenol prn.  Monitor with increased exertion  4. Mood: LCSW to follow for evaluation and support.   -antipsychotic agents: N/A 5. Neuropsych: This patient is capable of making decisions on his own behalf. 6. Skin/Wound Care: Routine pressure relief measures.  7. Fluids/Electrolytes/Nutrition: Monitor I/Os.    CMP ordered for tomorrow 8. FTT: Has had issues with N//V/Malaise since last fall due to chemo  --offer supplements between meals  --monitor for orthostatic symptoms.  --recheck Magnesium levels in am.  9. Enterococcus UTI: On amoxacillin for 2 additional days. 10. Anemia of chronic disease:   CBC ordered for tomorrow AM 11. 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 HTN: Monitor BP tid--check orthostatic BP  Monitor with increased mobility 13. SOB: Encourage pulmonary hygiene.  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 Question gout flare v/s ingrown toe nail.   Increase meds as necessary  Bary Leriche, PA-C 03/10/2021  I have personally performed a face to face diagnostic evaluation, including, but not limited to relevant history and physical exam findings, of this patient and developed relevant assessment and plan.  Additionally, I have reviewed and concur with the physician assistant's documentation above.  Delice Lesch, MD, ABPMR

## 2021-03-10 NOTE — Progress Notes (Signed)
Patient arrived via stretcher with hospital transport staff @ [phone]. Patient was pleasant, alert and oriented, and cooperative with instructions. Followed commands and was educated to hospital policies, safety plan, room and equipment. Patient and sister verbalized understanding.  Patient had complaints of bilateral leg and foot pain. Patient stated the pain was always like that. Patient wanted sister to be approved to spend the night to assist with care at night. Will continue to monitor. Bed locked and in the lowest position with bed alarm setting on and call light within reach.

## 2021-03-11 DIAGNOSIS — Z7189 Other specified counseling: Secondary | ICD-10-CM

## 2021-03-11 DIAGNOSIS — R52 Pain, unspecified: Secondary | ICD-10-CM

## 2021-03-11 DIAGNOSIS — Z515 Encounter for palliative care: Secondary | ICD-10-CM

## 2021-03-11 DIAGNOSIS — C9000 Multiple myeloma not having achieved remission: Secondary | ICD-10-CM

## 2021-03-11 LAB — CBC WITH DIFFERENTIAL/PLATELET
Abs Immature Granulocytes: 0.05 10*3/uL (ref 0.00–0.07)
Basophils Absolute: 0 10*3/uL (ref 0.0–0.1)
Basophils Relative: 0 %
Eosinophils Absolute: 0.1 10*3/uL (ref 0.0–0.5)
Eosinophils Relative: 2 %
HCT: 29.9 % — ABNORMAL LOW (ref 39.0–52.0)
Hemoglobin: 10.2 g/dL — ABNORMAL LOW (ref 13.0–17.0)
Immature Granulocytes: 1 %
Lymphocytes Relative: 24 %
Lymphs Abs: 1.2 10*3/uL (ref 0.7–4.0)
MCH: 32.1 pg (ref 26.0–34.0)
MCHC: 34.1 g/dL (ref 30.0–36.0)
MCV: 94 fL (ref 80.0–100.0)
Monocytes Absolute: 0.6 10*3/uL (ref 0.1–1.0)
Monocytes Relative: 12 %
Neutro Abs: 3.1 10*3/uL (ref 1.7–7.7)
Neutrophils Relative %: 61 %
Platelets: 189 10*3/uL (ref 150–400)
RBC: 3.18 MIL/uL — ABNORMAL LOW (ref 4.22–5.81)
RDW: 15.9 % — ABNORMAL HIGH (ref 11.5–15.5)
WBC: 5.1 10*3/uL (ref 4.0–10.5)
nRBC: 0 % (ref 0.0–0.2)

## 2021-03-11 LAB — COMPREHENSIVE METABOLIC PANEL
ALT: 15 U/L (ref 0–44)
AST: 16 U/L (ref 15–41)
Albumin: 2.1 g/dL — ABNORMAL LOW (ref 3.5–5.0)
Alkaline Phosphatase: 82 U/L (ref 38–126)
Anion gap: 4 — ABNORMAL LOW (ref 5–15)
BUN: 6 mg/dL — ABNORMAL LOW (ref 8–23)
CO2: 22 mmol/L (ref 22–32)
Calcium: 7.4 mg/dL — ABNORMAL LOW (ref 8.9–10.3)
Chloride: 111 mmol/L (ref 98–111)
Creatinine, Ser: 0.52 mg/dL — ABNORMAL LOW (ref 0.61–1.24)
GFR, Estimated: >60 mL/min (ref >60)
Glucose, Bld: 96 mg/dL (ref 70–99)
Potassium: 3.9 mmol/L (ref 3.5–5.1)
Sodium: 137 mmol/L (ref 135–145)
Total Bilirubin: 0.5 mg/dL (ref 0.3–1.2)
Total Protein: 3.7 g/dL — ABNORMAL LOW (ref 6.5–8.1)

## 2021-03-11 LAB — MAGNESIUM: Magnesium: 1.6 mg/dL — ABNORMAL LOW (ref 1.7–2.4)

## 2021-03-11 LAB — URIC ACID: Uric Acid, Serum: 3.1 mg/dL — ABNORMAL LOW (ref 3.7–8.6)

## 2021-03-11 MED ORDER — MAGNESIUM SULFATE 2 GM/50ML IV SOLN
2.0000 g | Freq: Once | INTRAVENOUS | Status: AC
  Administered 2021-03-11: 2 g via INTRAVENOUS
  Filled 2021-03-11: qty 50

## 2021-03-11 MED ORDER — ACETAMINOPHEN 500 MG PO TABS
1000.0000 mg | ORAL_TABLET | Freq: Three times a day (TID) | ORAL | Status: DC
  Administered 2021-03-11 – 2021-03-12 (×3): 1000 mg via ORAL
  Filled 2021-03-11 (×3): qty 2

## 2021-03-11 MED ORDER — ACETAMINOPHEN 500 MG PO TABS: 1000.0000 mg | ORAL_TABLET | Freq: Three times a day (TID) | ORAL | Status: DC

## 2021-03-11 MED ORDER — DULOXETINE HCL 30 MG PO CPEP
30.0000 mg | ORAL_CAPSULE | Freq: Every day | ORAL | Status: DC
  Administered 2021-03-11 – 2021-03-12 (×2): 30 mg via ORAL
  Filled 2021-03-11 (×2): qty 1

## 2021-03-11 MED ORDER — TAMSULOSIN HCL 0.4 MG PO CAPS
0.4000 mg | ORAL_CAPSULE | Freq: Every day | ORAL | Status: DC
  Administered 2021-03-11 – 2021-03-13 (×3): 0.4 mg via ORAL
  Filled 2021-03-11 (×3): qty 1

## 2021-03-11 MED ORDER — TRAMADOL HCL 50 MG PO TABS
50.0000 mg | ORAL_TABLET | Freq: Four times a day (QID) | ORAL | Status: DC
  Filled 2021-03-11: qty 1

## 2021-03-11 MED ORDER — MAGNESIUM OXIDE -MG SUPPLEMENT 400 (240 MG) MG PO TABS
400.0000 mg | ORAL_TABLET | Freq: Two times a day (BID) | ORAL | Status: DC
  Administered 2021-03-11 – 2021-03-12 (×3): 400 mg via ORAL
  Filled 2021-03-11 (×3): qty 1

## 2021-03-11 NOTE — Progress Notes (Signed)
Inpatient Rehabilitation Care Coordinator Assessment and Plan Patient Details  Name: Bruce Smith MRN: [id number] Date of Birth: 07-12-1951  Today's Date: 03/11/2021  Hospital Problems: Principal Problem:   Physical debility  Past Medical History:  Past Medical History:  Diagnosis Date  . Atherosclerosis   . Coronary artery disease   . Goals of care, counseling/discussion 06/29/2020  . 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 Hyperlipidemia   . Hypertension   . 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 Multiple myeloma ([place]) 07/21/2020  . Myocardial infarction [facility])    Past Surgical History:  Past Surgical History:  Procedure Laterality Date  . ANTERIOR CERVICAL DECOMP/DISCECTOMY FUSION N/A 06/17/2020   Procedure: ANTERIOR CERVICAL DECOMPRESSION/DISCECTOMY FUSION, INTERBODY PROSTHESIS, PLATE/SCREWS CERVICAL FIVE- CERVICAL SIX;  Surgeon: Newman Pies, MD;  Location: [place];  Service: Neurosurgery;  Laterality: N/A;  ANTERIOR CERVICAL DECOMPRESSION/DISCECTOMY FUSION, INTERBODY PROSTHESIS, PLATE/SCREWS CERVICAL FIVE- CERVICAL SIX  . BIOPSY  02/24/2021   Procedure: BIOPSY;  Surgeon: Otis Brace, MD;  Location: [facility];  Service: Gastroenterology;;  . CARDIAC CATHETERIZATION    . ESOPHAGOGASTRODUODENOSCOPY N/A 02/24/2021   Procedure: ESOPHAGOGASTRODUODENOSCOPY (EGD);  Surgeon: Otis Brace, MD;  Location: [facility];  Service: Gastroenterology;  Laterality: N/A;  . FEMUR IM NAIL Left 07/14/2020   Procedure: OPEN REDUCTION INTERNAL FIXATION (ORIF LEFT FEMORAL NAIL FRACTURE;  Surgeon: Marchia Bond, MD;  Location: [place];  Service: Orthopedics;  Laterality: Left;   Social History:  reports that he quit smoking about 22 years ago. His smokeless tobacco use includes chew. He reports previous alcohol use. He reports that he does not use drugs.  Family / Support Systems Patient Roles: Spouse Spouse/Significant Other: Joelene Millin Children: Cecille Rubin  (dtr) Anticipated Caregiver: Molli Knock Ability/Limitations of Caregiver: spouse in remission from cancer, cannot sustain more than Min A for ADLS and Supervision for mobility Caregiver Availability: 24/7  Social History Preferred language: English Religion: None Read: Yes Write: Yes Legal History/Current Legal Issues: n/a Guardian/Conservator: Sharen Counter   Abuse/Neglect Abuse/Neglect Assessment Can Be Completed: Yes Physical Abuse: Denies Verbal Abuse: Denies Sexual Abuse: Denies Exploitation of patient/patient's resources: Denies Self-Neglect: Denies  Emotional Status Pt's affect, behavior and adjustment status: pt pleasant. Recent Psychosocial Issues: Coping, Goals of Care Psychiatric History: n/a Substance Abuse History: n/a  Patient / Family Perceptions, Expectations & Goals Pt/Family understanding of illness & functional limitations: yes Premorbid pt/family roles/activities: Pt very limited in mobility since fracture in Jan. Min/Mod A for ADLs, Debilitated following stem transplant Anticipated changes in roles/activities/participation: spouse in remission from cancer, cannot sustain more than Min A for ADLS and Supervision for mobility Pt/family expectations/goals: MOD I/MIN A (Pending Fatigue Level)  [employer]: None Premorbid Home Care/DME Agencies: Other (Comment) Lobbyist, Shower Head, Conservation officer, nature, Marketing executive, Engineer, civil (consulting), [employer], [place]) Transportation available at discharge: Family able to [employer] referrals recommended: Neuropsychology (Coping and Goals of Care)  Discharge Planning Living Arrangements: Spouse/significant other Support Systems: Spouse/significant other,Children Type of Residence: Private residence (2 level home, able to maintain on main level, 3 steps to enter, Ramp entrance) Insurance Resources: Magazine features editor (specify) (BCBS COMM PPO and Medicare Part A) Financial  Resources: Secondary school teacher Screen Referred: No Living Expenses: Rent Money Management: Patient,Spouse Does the patient have any problems obtaining your medications?: No Home Management: Pt spouse assited with medication management Patient/Family Preliminary Plans: Family will continue to assist with medication and money mang Care Coordinator Barriers to Discharge: Insurance for SNF coverage Care Coordinator Anticipated Follow  Up Needs: HH/OP DC Planning Additional Notes/Comments: spouse in remission from cancer, cannot sustain more than Min A for ADLS and Supervision for mobility Expected length of stay: 6-9 Days  Clinical Impression Sw met with pt and spouse in room. Introduced self, explained role and addressed questions and concerns. Pt plans to d/c home with spouse. Pt lives in a 2 level home, able to maintain on main level. Pt anticipated to d/c next Wednesday 5/18. Pt spouse requesting [facility] for [facility]. Pt active with agency, sw will fax over updated information. No additional questions or concerns, sw will cont to follow up  Dyanne Iha 03/11/2021, 1:13 PM

## 2021-03-11 NOTE — Progress Notes (Signed)
Patient ID: Bruce Smith, male   DOB: Feb 27, 1951, 70 y.o.   MRN: [id number]   Pt [facility] referral sent to [place], [place]

## 2021-03-11 NOTE — Progress Notes (Signed)
Occupational Therapy Session Note  Patient Details  Name: Bruce Smith MRN: [id number] Date of Birth: Feb 22, 1951  Today's Date: 03/11/2021 OT Individual Time: 5462-7035 OT Individual Time Calculation (min): 41 min    Short Term Goals: Week 1:  OT Short Term Goal 1 (Week 1): STGs = LTGs d/t ELOS at Supervision  Skilled Therapeutic Interventions/Progress Updates:    Pt in bed to start the session.  He was able to complete supine to sit EOB with min guard.  He stated that he was nauseas so nursing was notified for pt's request to have something.  He agreed to participate in session with transfer to the wheelchair with min assist.  Therapist took him down to the therapy gym where he worked on [employer] with use of the UE ergonometer.  He was able to complete 1 interval of 4 mins with resistance on level 7 while maintaining RPMs at level 20 or higher.  BP taken once complete at 134/60.  Pt's nausea had increased at this point and he requested return to the room.  While back at the room, he completed stand pivot transfer to the 3:1 over the toilet with min assist.  Clothing management and toilet hygiene also completed at the same level before transferring back to the wheelchair.  He was pushed over to the sink where he washed his hands to complete session.  He chose to sit up in the wheelchair with the call button and phone in reach and safety belt in place.  Nursing in to administer medications as well.    Therapy Documentation Precautions:  Precautions Precautions: Fall Precaution Comments: hx falls resulting in scapula, hip and tib fracture within past 4 months Restrictions Weight Bearing Restrictions: No  Pain: Pain Assessment Pain Scale: Faces Pain Score: 0-No pain ADL: See Care Tool Section for some details of mobility and selfcare  Therapy/Group: Individual Therapy  Marjorie Deprey OTR/L 03/11/2021, 4:34 PM

## 2021-03-11 NOTE — Plan of Care (Signed)
  Problem: RH Balance Goal: LTG Patient will maintain dynamic standing with ADLs (OT) Description: LTG:  Patient will maintain dynamic standing balance with assist during activities of daily living (OT)  Flowsheets (Taken 03/11/2021 1648) LTG: Pt will maintain dynamic standing balance during ADLs with: Supervision/Verbal cueing   Problem: RH Bathing Goal: LTG Patient will bathe all body parts with assist levels (OT) Description: LTG: Patient will bathe all body parts with assist levels (OT) Flowsheets (Taken 03/11/2021 1648) LTG: Pt will perform bathing with assistance level/cueing: Supervision/Verbal cueing   Problem: RH Dressing Goal: LTG Patient will perform upper body dressing (OT) Description: LTG Patient will perform upper body dressing with assist, with/without cues (OT). Flowsheets (Taken 03/11/2021 1648) LTG: Pt will perform upper body dressing with assistance level of: Set up assist Goal: LTG Patient will perform lower body dressing w/assist (OT) Description: LTG: Patient will perform lower body dressing with assist, with/without cues in positioning using equipment (OT) Flowsheets (Taken 03/11/2021 1648) LTG: Pt will perform lower body dressing with assistance level of: Supervision/Verbal cueing   Problem: RH Toileting Goal: LTG Patient will perform toileting task (3/3 steps) with assistance level (OT) Description: LTG: Patient will perform toileting task (3/3 steps) with assistance level (OT)  Flowsheets (Taken 03/11/2021 1648) LTG: Pt will perform toileting task (3/3 steps) with assistance level: Supervision/Verbal cueing   Problem: RH Toilet Transfers Goal: LTG Patient will perform toilet transfers w/assist (OT) Description: LTG: Patient will perform toilet transfers with assist, with/without cues using equipment (OT) Flowsheets (Taken 03/11/2021 1648) LTG: Pt will perform toilet transfers with assistance level of: Supervision/Verbal cueing   Problem: RH Tub/Shower  Transfers Goal: LTG Patient will perform tub/shower transfers w/assist (OT) Description: LTG: Patient will perform tub/shower transfers with assist, with/without cues using equipment (OT) Flowsheets (Taken 03/11/2021 1648) LTG: Pt will perform tub/shower stall transfers with assistance level of: Supervision/Verbal cueing

## 2021-03-11 NOTE — Progress Notes (Signed)
Inpatient [place] Individual Statement of Services  Patient Name:  Bruce Smith  Date:  03/11/2021  Welcome to the [facility].  Our goal is to provide you with an individualized program based on your diagnosis and situation, designed to meet your specific needs.  With this comprehensive rehabilitation program, you will be expected to participate in at least 3 hours of rehabilitation therapies Monday-Friday, with modified therapy programming on the weekends.  Your rehabilitation program will include the following services:  Physical Therapy (PT), Occupational Therapy (OT), Speech Therapy (ST), 24 hour per day rehabilitation nursing, Therapeutic Recreaction (TR), Neuropsychology, Care Coordinator, Rehabilitation Medicine, Nutrition Services, Pharmacy Services and Other  Weekly team conferences will be held on Tuesdays to discuss your progress.  Your Inpatient Rehabilitation Care Coordinator will talk with you frequently to get your input and to update you on team discussions.  Team conferences with you and your family in attendance may also be held.  Expected length of stay: 6-9 Days  Overall anticipated outcome: MOD I/ MIN A  Depending on your progress and recovery, your program may change. Your Inpatient Rehabilitation Care Coordinator will coordinate services and will keep you informed of any changes. Your Inpatient Rehabilitation Care Coordinator's name and contact numbers are listed  below.  The following services may also be recommended but are not provided by the [place]:    [place] will be made to provide these services after discharge if needed.  Arrangements include referral to agencies that provide these services.  Your insurance has been verified to be:  BCBS COMM PPO Your primary doctor is:  Derrill Center., MD  Pertinent information will be  shared with your doctor and your insurance company.  Inpatient Rehabilitation Care Coordinator:  Erlene Quan, [facility] or [phone]  Information discussed with and copy given to patient by: Dyanne Iha, 03/11/2021, 9:02 AM

## 2021-03-11 NOTE — Consult Note (Signed)
Consultation Note Date: 03/11/2021   Patient Name: Bruce Smith  DOB: 11-Feb-1951  MRN: [id number]  Age / Sex: 70 y.o., male  PCP: Derrill Center., MD Referring Physician: Courtney Heys, MD  Reason for Consultation: Establishing goals of care  HPI/Patient Profile: 70 y.o. male  with past medical history of of HTN, esophageal stricture, MM with extensive disease and pathologic fractures left scapular and left femur who underwent ASCT at [facility] on 01/22/2021 admitted to [facility] 03/10/2021 following hospital stay 4/26 - 5/48fr fall, weakness, poor appetite and N/V. Hospitalization complicated by enterococcus UTI, BLE pain, and delirium likely r/t narcotics. He required TPN during hospitalization 5/5-5/9. CIR was recommended d/t functional decline. PMT consulted to assist with pain management.   Clinical Assessment and Goals of Care: I have reviewed medical records including EPIC notes, labs and imaging, assessed the patient and then met with patient and his sister KZigmund Danielto discuss diagnosis prognosis, [place] E[place] wishes, disposition and options.  They are familiar with palliative care role in care - [facility].   He tells me his pain is currently uncontrolled - BLE intermittent shooting pain. Wakes him up through the night and continues through the day.   [facility] reviewed medications he was on, reviewed his delirium that was attributed to narcotics. We discuss the regimen that ended up controlling his pain but not adding to delirium was tylenol, lyrica, and toradol. He would like toradol again. We discussed this is not a long term option but that he is on an NSAID - celebrex. He thought he was receiving tylenol around the clock - we review that it is PRN, he needs to ask for it. We discussed a trial of scheduled tylenol (1000 mg TID). We discuss that NSAID paired with  acetaminophen can be as effective as opioids. Patient would very much like to avoid narcotics if pain can be controlled without them.   Questions and concerns were addressed. The family was encouraged to call with questions or concerns.   Discussed plan with RN via epic chat.  Primary Decision Maker PATIENT    SUMMARY OF RECOMMENDATIONS   - initiate scheduled tylenol - 1000 mg TID - continue lyrica and celebrex - hold off on tramadol - patient would like to avoid if possible - cymbalta added by CIR team - will f/u in AM  Code Status/Advance Care Planning:  Full code     Primary Diagnoses: Present on Admission: . Physical debility   I have reviewed the medical record, interviewed the patient and family, and examined the patient. The following aspects are pertinent.  Past Medical History:  Diagnosis Date  . Atherosclerosis   . Coronary artery disease   . Goals of care, counseling/discussion 06/29/2020  . 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 Hyperlipidemia   . Hypertension   . [place] 06/29/[place]  . [place] 07/21/[place]  . Myocardial infarction ([facility]    Social History   Socioeconomic History  . Marital status: Married    Spouse name: Not on file  . Number of children: Not on file  . Years of education: Not on file  . Highest education level: Not on file  Occupational History  . Not on file  Tobacco Use  . Smoking status: Former Smoker    Quit date: 2000    Years since quitting: 22.3  . Smokeless  tobacco: Current User    Types: Chew  Vaping Use  . Vaping Use: Never used  Substance and Sexual Activity  . Alcohol use: Not Currently    Comment: rare  . Drug use: Never  . Sexual activity: Not Currently  Other Topics Concern  . Not on file  Social History Narrative  . Not on file   Social Determinants of Health   Financial Resource Strain: Not on file  Food Insecurity: Not on file   Transportation Needs: Not on file  Physical Activity: Not on file  Stress: Not on file  Social Connections: Not on file   Family History  Problem Relation Age of Onset  . Stroke Mother   . Prostate cancer Paternal Grandfather    Scheduled Meds: . acetaminophen  1,000 mg Oral TID  . acyclovir  400 mg Oral BID  . amoxicillin  500 mg Oral Q8H  . celecoxib  200 mg Oral BID  . DULoxetine  30 mg Oral Daily  . enoxaparin (LOVENOX) injection  40 mg Subcutaneous Q24H  . feeding supplement  1 Container Oral TID BM  . magnesium oxide  400 mg Oral BID  . pantoprazole  40 mg Oral BID AC  . polycarbophil  625 mg Oral Daily  . pregabalin  100 mg Oral Daily  . pregabalin  150 mg Oral QHS  . saccharomyces boulardii  250 mg Oral BID  . tamsulosin  0.4 mg Oral Daily   Continuous Infusions: . magnesium sulfate bolus IVPB 2 g (03/11/21 1205)   PRN Meds:.alum & mag hydroxide-simeth, bisacodyl, diphenhydrAMINE, guaiFENesin-dextromethorphan, loperamide, ondansetron, polyethylene glycol, prochlorperazine **OR** prochlorperazine **OR** prochlorperazine, sodium phosphate, traZODone No Known Allergies Review of Systems  Constitutional: Positive for activity change.  Musculoskeletal:       Shooting pain in BLE  Neurological: Positive for weakness.    Physical Exam Constitutional:      General: He is not in acute distress. Pulmonary:     Effort: Pulmonary effort is normal.  Skin:    General: Skin is warm and dry.  Neurological:     Mental Status: He is alert and oriented to person, place, and time.  Psychiatric:        Mood and Affect: Mood normal.        Behavior: Behavior normal.     Vital Signs: BP 110/71 (BP Location: Left Arm)   Pulse 84   Temp 98.5 F (36.9 C) (Oral)   Resp 18   Wt 66.4 kg   SpO2 99%   BMI 22.26 kg/m  Pain Scale: 0-10   Pain Score: 5    SpO2: SpO2: 99 % O2 Device:SpO2: 99 % O2 Flow Rate: .   IO: Intake/output summary:   Intake/Output Summary (Last  24 hours) at 03/11/2021 1227 Last data filed at 03/11/2021 0855 Gross per 24 hour  Intake 440 ml  Output 800 ml  Net -360 ml    LBM: Last BM Date: 03/10/21 Baseline Weight: Weight: 66.4 kg Most recent weight: Weight: 66.4 kg     Palliative Assessment/Data: PPS 60%    Time Total: 55 minutes Greater than 50%  of this time was spent counseling and coordinating care related to the above assessment and plan.  Juel Burrow, DNP, AGNP-C Palliative Medicine Team [phone] Pager: [phone]

## 2021-03-11 NOTE — Progress Notes (Signed)
Occupational Therapy Session Note  Patient Details  Name: Reed Dady MRN: [id number] Date of Birth: May 26, 1951  Today's Date: 03/11/2021 OT Individual Time: 6580-0634 OT Individual Time Calculation (min): 40 min    Short Term Goals: Week 1:  OT Short Term Goal 1 (Week 1): STGs = LTGs d/t ELOS at Supervision  Skilled Therapeutic Interventions/Progress Updates:    Pt greeted at time of session walking back from bathroom with sister [name], stating he had loose stools day and sister helping him to/from bathroom. Educated that sister will need to be checked off to do this, sister providing CGA as pt ambulated to/from bathroom and simulated toileting with no issues, good hand placement on gait belt and able to don/doff belt. Checked off sister for providing CGA/Min A as needed. Pt and sister having questions about medication, recommended speak with nursing who entered at this time for med pass. Provided pt with therabands attached to bed rails as he is familiar with bed level activity and per his request, remainder of session focused on energy conservation techniques with hand out from toolkit provided and pt grateful. Call bell in reach all needs met.  Therapy Documentation Precautions:  Precautions Precautions: Fall Precaution Comments: hx falls resulting in scapula, hip and tib fracture within past 4 months Restrictions Weight Bearing Restrictions: No     Therapy/Group: Individual Therapy  Viona Gilmore 03/11/2021, 4:44 PM

## 2021-03-11 NOTE — Progress Notes (Signed)
Patient information reviewed and entered into eRehab System by Becky Jacon Whetzel, PPS coordinator. Information including medical coding, function ability, and quality indicators will be reviewed and updated through discharge.   

## 2021-03-11 NOTE — Progress Notes (Signed)
[facility]) Hospital Liaison note:  This is a pending outpatient-based Palliative Care patient. Will continue to follow for disposition.  Please call with any outpatient palliative questions or concerns.  Thank you, Lorelee Market, LPN [facility] Liaison [phone]

## 2021-03-11 NOTE — Evaluation (Signed)
Physical Therapy Assessment and Plan  Patient Details  Name: Bruce Smith MRN: [id number] Date of Birth: 05-14-1951  PT Diagnosis: Difficulty walking, Impaired sensation, Muscle weakness and Pain in left LE Rehab Potential: Good ELOS: 5-7 days   Today's Date: 03/11/2021 PT Individual Time: 0945-1100 PT Individual Time Calculation (min): 75 min    Hospital Problem: Principal Problem:   Physical debility   Past Medical History:  Past Medical History:  Diagnosis Date  . Atherosclerosis   . Coronary artery disease   . Goals of care, counseling/discussion 06/29/2020  . 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 Hyperlipidemia   . Hypertension   . 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 Multiple myeloma ([place]) 07/21/2020  . Myocardial infarction [facility])    Past Surgical History:  Past Surgical History:  Procedure Laterality Date  . ANTERIOR CERVICAL DECOMP/DISCECTOMY FUSION N/A 06/17/2020   Procedure: ANTERIOR CERVICAL DECOMPRESSION/DISCECTOMY FUSION, INTERBODY PROSTHESIS, PLATE/SCREWS CERVICAL FIVE- CERVICAL SIX;  Surgeon: Newman Pies, MD;  Location: [place];  Service: Neurosurgery;  Laterality: N/A;  ANTERIOR CERVICAL DECOMPRESSION/DISCECTOMY FUSION, INTERBODY PROSTHESIS, PLATE/SCREWS CERVICAL FIVE- CERVICAL SIX  . BIOPSY  02/24/2021   Procedure: BIOPSY;  Surgeon: Otis Brace, MD;  Location: [facility];  Service: Gastroenterology;;  . CARDIAC CATHETERIZATION    . ESOPHAGOGASTRODUODENOSCOPY N/A 02/24/2021   Procedure: ESOPHAGOGASTRODUODENOSCOPY (EGD);  Surgeon: Otis Brace, MD;  Location: [facility];  Service: Gastroenterology;  Laterality: N/A;  . FEMUR IM NAIL Left 07/14/2020   Procedure: OPEN REDUCTION INTERNAL FIXATION (ORIF LEFT FEMORAL NAIL FRACTURE;  Surgeon: Marchia Bond, MD;  Location: [place];  Service: Orthopedics;  Laterality: Left;    Assessment & Plan Clinical Impression: Patient is a 70 y.o. year old male with history of HTN,  esophageal stricture, MM with extensive disease and pathologic fractures left scapular and left femur who underwent ASCT at [facility] on 01/22/2021. [id number] was admitted via MD office on 02/23/21 with controlled fall, weakness, poor appetite and N/V with inability to keep down anything but water. He was started on fluids for hydration and Dr. Rosalie Gums consulted for input. 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 He was found to have enterococcus UTI and started on Unasyn briefly.    Palliative care consulted to help manage cancer related BLE pain and he was started on fentanyl with dilaudid but developed increase in confusion. He continued to have difficulty with intake and receive TPN from 05/05 -05/09. He was transitioned to NSAIDs and narcotics d/c with resolution of confusion. UA/UCS rechecked on 05/06 revealing 40,000 colonies of enterococcus UTI and was started on IV ampicillin 05/09 transitioned to amoxicillin today with recommendations to continue was resumed with recommendations to complete 5 total day treatment. Intake has improved with improvement in activity tolerance but he continues to be limited by weakness with fatigue, SOB with activity and poor posture with balance deficits. CIR recommended due to decline.   Please see preadmission assessment from earlier today as well. Patient transferred to CIR on 03/10/2021 .   Patient currently requires min with mobility secondary to muscle weakness, decreased cardiorespiratoy endurance and decreased sitting balance, decreased standing balance, decreased postural control and decreased balance strategies.  Prior to hospitalization, patient was supervision to min A pending fatigue and generally mobility s/p medical needs with mobility and lived with Spouse in a House home.  Home access is 3Stairs to enter,Ramped entrance.  Patient will benefit from skilled PT intervention to maximize safe functional  mobility, minimize fall  risk and decrease caregiver burden for planned discharge home with 24 hour assist.  Anticipate patient will benefit from follow up [place] at discharge.  PT - End of Session Activity Tolerance: Tolerates 10 - 20 min activity with multiple rests Endurance Deficit: Yes PT Assessment Rehab Potential (ACUTE/IP ONLY): Good PT Barriers to Discharge: Weight;Nutrition means PT Patient demonstrates impairments in the following area(s): Balance;Endurance;Pain;Nutrition;Safety;Skin Integrity PT Transfers Functional Problem(s): Bed Mobility;Bed to Chair;Car;Furniture PT Locomotion Functional Problem(s): Ambulation;Wheelchair Mobility;Stairs PT Plan PT Intensity: Minimum of 1-2 x/day ,45 to 90 minutes PT Frequency: 5 out of 7 days PT Duration Estimated Length of Stay: 5-7 days PT Treatment/Interventions: Ambulation/gait training;Balance/vestibular training;Cognitive remediation/compensation;Community reintegration;Discharge planning;Disease management/prevention;DME/adaptive equipment instruction;Functional electrical stimulation;Functional mobility training;Pain management;Patient/family education;Psychosocial support;Skin care/wound management;Splinting/orthotics;Stair training;Wheelchair propulsion/positioning;Visual/perceptual remediation/compensation;UE/LE Coordination activities;UE/LE Strength taining/ROM;Therapeutic Exercise;Therapeutic Activities PT Transfers Anticipated Outcome(s): supervision with LRAD PT Locomotion Anticipated Outcome(s): supervision with LRAD for household distances PT Recommendation Follow Up Recommendations: Home health PT Patient destination: Home Equipment Recommended: To be determined   PT Evaluation Precautions/Restrictions Precautions Precautions: Fall Precaution Comments: hx falls resulting in scapula, hip and tib fracture within past 4 months Restrictions Weight Bearing Restrictions: No General   Vital Signs  Pain Pain Assessment Pain  Scale: 0-10 Pain Score: 7  Pain Type: Chronic pain Pain Location: Leg Pain Orientation: Left Pain Descriptors / Indicators: Aching;Tingling Pain Onset: With Activity Pain Intervention(s): RN made aware;Repositioned;Rest Multiple Pain Sites: No Home Living/Prior Functioning Home Living Living Arrangements: Spouse/significant other Available Help at Discharge: Family;Available 24 hours/day Type of Home: House Home Access: Stairs to enter;Ramped entrance Entrance Stairs-Number of Steps: 3 Entrance Stairs-Rails: Right Home Layout: Two level;Able to live on main level with bedroom/bathroom;Full bath on main level Alternate Level Stairs-Number of Steps: has full flight to 2nd level but does not need to use Alternate Level Stairs-Rails: Left Bathroom Shower/Tub: Multimedia programmer: Standard Bathroom Accessibility: Yes Additional Comments: Has an adjustable bed  Lives With: Spouse Prior Function Level of Independence: Needs assistance with gait;Needs assistance with tranfers (pt reports he was needing supervision and ~min A with recent cancer treatments but prior to that was I)  Able to Take Stairs?: Yes (though required rest breaks) Driving: No Vocation: Retired Leisure: Hobbies-yes (Comment) (likes being outside on his porch) Comments: amb with rollator Vision/Perception  Perception Perception: Within Functional Limits Praxis Praxis: Intact  Cognition Overall Cognitive Status: Within Functional Limits for tasks assessed Arousal/Alertness: Awake/alert Orientation Level: Oriented X4 Attention: Focused;Sustained Focused Attention: Appears intact Sustained Attention: Appears intact Awareness: Appears intact Problem Solving: Appears intact Safety/Judgment: Appears intact Sensation Sensation Light Touch: Impaired by gross assessment (pt reports tingling from B knees distally) Motor  Motor Motor: Within Functional Limits Motor - Skilled Clinical Observations:  limited with global weakness and fatigue   Trunk/Postural Assessment  Cervical Assessment Cervical Assessment: Within Functional Limits Thoracic Assessment Thoracic Assessment: Within Functional Limits Lumbar Assessment Lumbar Assessment: Within Functional Limits Postural Control Postural Control: Deficits on evaluation Postural Limitations: decreased standing balance with decreased righting reactions without AD  Balance Balance Balance Assessed: Yes Standardized Balance Assessment Standardized Balance Assessment: Timed Up and Go Test Timed Up and Go Test TUG: Normal TUG Normal TUG (seconds): 63 (only one rep 2/2 fatigue) Static Sitting Balance Static Sitting - Balance Support: No upper extremity supported;Feet supported Static Sitting - Level of Assistance: 5: Stand by assistance Dynamic Sitting Balance Dynamic Sitting - Balance Support: No upper extremity supported;Feet supported Dynamic Sitting - Level of Assistance: 5: Stand by assistance Dynamic Sitting - Balance  Activities: Lateral lean/weight shifting;Forward lean/weight shifting;Reaching across midline Static Standing Balance Static Standing - Balance Support: Bilateral upper extremity supported Static Standing - Level of Assistance: 5: Stand by assistance Dynamic Standing Balance Dynamic Standing - Balance Support: Bilateral upper extremity supported;During functional activity Dynamic Standing - Level of Assistance: 4: Min assist Extremity Assessment      RLE Assessment RLE Assessment: Exceptions to [facility] General Strength Comments: grossly 4/5 assessed functionally LLE Assessment General Strength Comments: grossly 4/5 assessed functionally  Care Tool Care Tool Bed Mobility Roll left and right activity   Roll left and right assist level: Supervision/Verbal cueing    Sit to lying activity   Sit to lying assist level: Contact Guard/Touching assist    Lying to sitting edge of bed activity   Lying to sitting edge  of bed assist level: Contact Guard/Touching assist     Care Tool Transfers Sit to stand transfer   Sit to stand assist level: Contact Guard/Touching assist    Chair/bed transfer   Chair/bed transfer assist level: Contact Guard/Touching assist     Toilet transfer   Assist Level: Contact Guard/Touching assist    Car transfer   Car transfer assist level: Contact Guard/Touching assist      Care Tool Locomotion Ambulation   Assist level: Contact Guard/Touching assist Assistive device: Walker-rolling Max distance: 50  Walk 10 feet activity   Assist level: Contact Guard/Touching assist Assistive device: Walker-rolling   Walk 50 feet with 2 turns activity   Assist level: Contact Guard/Touching assist Assistive device: Walker-rolling  Walk 150 feet activity Walk 150 feet activity did not occur: Safety/medical concerns      Walk 10 feet on uneven surfaces activity Walk 10 feet on uneven surfaces activity did not occur: Safety/medical concerns      Stairs   Assist level: Minimal Assistance - Patient > 75% Stairs assistive device: 2 hand rails Max number of stairs: 4  Walk up/down 1 step activity   Walk up/down 1 step (curb) assist level: Moderate Assistance - Patient - 50 - 74% Walk up/down 1 step or curb assistive device: Walker    Walk up/down 4 steps activity Walk up/down 4 steps assist level: Minimal Assistance - Patient > 75% Walk up/down 4 steps assistive device: 2 hand rails  Walk up/down 12 steps activity Walk up/down 12 steps activity did not occur: Safety/medical concerns      Pick up small objects from floor   Pick up small object from the floor assist level: Moderate Assistance - Patient 50 - 74%    Wheelchair Will patient use wheelchair at discharge?: Yes Type of Wheelchair: Manual   Wheelchair assist level: Minimal Assistance - Patient > 75% Max wheelchair distance: 75  Wheel 50 feet with 2 turns activity   Assist Level: Minimal Assistance - Patient > 75%   Wheel 150 feet activity Wheelchair 150 feet activity did not occur: Safety/medical concerns      Refer to Care Plan for Long Term Goals  SHORT TERM GOAL WEEK 1 PT Short Term Goal 1 (Week 1): STG=LTG 2/2 ELOS  Recommendations for other services: None   Skilled Therapeutic Intervention  Evaluation completed (see details above and below) with education on PT POC and goals and individual treatment initiated with focus on  Bed mobility, transfer training, gait training, safety awareness, call light use, stair training, WB mobility, energy conservation at home. pt received in bed and agreeable to therapy. Sister present throughout session. Pt directed in supine>sit CGA with light use  of bedrails. Pt reported he does have an adjustable bed at home however demonstrated ability to come to sitting without HOB elevated. Pt reported slight dizziness at EOB but resolved with static sitting at EOB SBA. Pt then directed in Sit to stand from bed to Rolling walker CGA and directed in gait training 50' CGA with Rolling walker with VC for increased stride length and trunk extension. Pt reported increased fatigue and that he had just ambulated to restroom prior to PT evaluation which he felt impacted his ability to ambulate longer distance during this eval. Pt then taken to gym in [facility] for energy conservation. Pt directed in car transfer CGA. Pt then taken to gym total A for energy. Pt directed in ascending/descending  Stairs x4 with B handrails min A and one brief standing rest break at top of stairs. Pt required prolonged rest break in [facility] then directed in WC mobility 75' min A. Pt returned to room total rest of distance and directed in Stand pivot transfer to bed CGA and CGA sit>supine. PT retrieved WC cushion from DME room and returned this to pt's room and placed in [facility] for improved skin integrity. Pt and pt's sister updated on therapy schedule, goals and pt verbalized he would like to DC as soon as able. PT educated  that LOS estimated to be 5-7 days pending medical status and improved function. He was agreeable. Team updated as well.   Mobility Bed Mobility Bed Mobility: Rolling Right;Rolling Left;Supine to Sit;Sit to Supine;Scooting to [facility];Sitting - Scoot to [employer] of Bed Rolling Right: Supervision/verbal cueing Rolling Left: Supervision/Verbal cueing Supine to Sit: Contact Guard/Touching assist Sitting - Scoot to Edge of Bed: Supervision/Verbal cueing Sit to Supine: Contact Guard/Touching assist Scooting to HOB: Contact Guard/Touching assist Transfers Transfers: Sit to Stand;Stand to Sit;Stand Pivot Transfers Sit to Stand: Contact Guard/Touching assist Stand to Sit: Contact Guard/Touching assist Stand Pivot Transfers: Contact Guard/Touching assist Transfer (Assistive device): Rolling walker Locomotion  Gait Ambulation: Yes Gait Assistance: Contact Guard/Touching assist Gait Distance (Feet): 50 Feet Assistive device: Rolling walker Gait Gait: Yes Gait Pattern: Impaired Gait Pattern: Trunk flexed;Decreased stride length Gait velocity: decreased Stairs / Additional Locomotion Stairs: Yes Stairs Assistance: Minimal Assistance - Patient > 75% Stair Management Technique: Two rails Number of Stairs: 4 Height of Stairs: 6 Ramp: Minimal Assistance - Patient >75% Curb: Moderate Assistance - Patient 50 - 74% Wheelchair Mobility Wheelchair Mobility: Yes Wheelchair Assistance: Minimal assistance - Patient >75% Wheelchair Propulsion: Both upper extremities Wheelchair Parts Management: Needs assistance Distance: 75   Discharge Criteria: Patient will be discharged from PT if patient refuses treatment 3 consecutive times without medical reason, if treatment goals not met, if there is a change in medical status, if patient makes no progress towards goals or if patient is discharged from hospital.  The above assessment, treatment plan, treatment alternatives and goals were discussed and mutually  agreed upon: by patient and by family  Junie Panning 03/11/2021, 11:51 AM

## 2021-03-11 NOTE — Evaluation (Signed)
Occupational Therapy Assessment and Plan  Patient Details  Name: Bruce Smith MRN: [id number] Date of Birth: July 03, 1951  OT Diagnosis: abnormal posture, acute pain and muscle weakness (generalized) Rehab Potential:   ELOS: 7 days   Today's Date: 03/11/2021 OT Individual Time: 8144-8185 OT Individual Time Calculation (min): 58 min     Hospital Problem: Principal Problem:   Physical debility   Past Medical History:  Past Medical History:  Diagnosis Date  . Atherosclerosis   . Coronary artery disease   . Goals of care, counseling/discussion 06/29/2020  . 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 Hyperlipidemia   . Hypertension   . 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 Multiple myeloma ([place]) 07/21/2020  . Myocardial infarction [facility])    Past Surgical History:  Past Surgical History:  Procedure Laterality Date  . ANTERIOR CERVICAL DECOMP/DISCECTOMY FUSION N/A 06/17/2020   Procedure: ANTERIOR CERVICAL DECOMPRESSION/DISCECTOMY FUSION, INTERBODY PROSTHESIS, PLATE/SCREWS CERVICAL FIVE- CERVICAL SIX;  Surgeon: Newman Pies, MD;  Location: [facility];  Service: Neurosurgery;  Laterality: N/A;  ANTERIOR CERVICAL DECOMPRESSION/DISCECTOMY FUSION, INTERBODY PROSTHESIS, PLATE/SCREWS CERVICAL FIVE- CERVICAL SIX  . BIOPSY  02/24/2021   Procedure: BIOPSY;  Surgeon: Otis Brace, MD;  Location: [facility];  Service: Gastroenterology;;  . CARDIAC CATHETERIZATION    . ESOPHAGOGASTRODUODENOSCOPY N/A 02/24/2021   Procedure: ESOPHAGOGASTRODUODENOSCOPY (EGD);  Surgeon: Otis Brace, MD;  Location: [facility];  Service: Gastroenterology;  Laterality: N/A;  . FEMUR IM NAIL Left 07/14/2020   Procedure: OPEN REDUCTION INTERNAL FIXATION (ORIF LEFT FEMORAL NAIL FRACTURE;  Surgeon: Marchia Bond, MD;  Location: [facility];  Service: Orthopedics;  Laterality: Left;    Assessment & Plan Clinical Impression: Bruce Smith is a 70 year old male with history of HTN, esophageal  stricture, MM with extensive disease and pathologic fractures left scapular and left femur who underwent ASCT at [facility] on 01/22/2021. [id number] was admitted via MD office on 02/23/21 with controlled fall, weakness, poor appetite and N/V with inability to keep down anything but water. He was started on fluids for hydration and Dr. Rosalie Gums consulted for input. 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 He was found to have enterococcus UTI and started on Unasyn briefly.    Palliative care consulted to help manage cancer related BLE pain and he was started on fentanyl with dilaudid but developed increase in confusion. He continued to have difficulty with intake and receive TPN from 05/05 -05/09. He was transitioned to NSAIDs and narcotics d/c with resolution of confusion. UA/UCS rechecked on 05/06 revealing 40,000 colonies of enterococcus UTI and was started on IV ampicillin 05/09 transitioned to amoxicillin today with recommendations to continue was resumed with recommendations to complete 5 total day treatment. Intake has improved with improvement in activity tolerance but he continues to be limited by weakness with fatigue, SOB with activity and poor posture with balance deficits. CIR recommended due to decline.   Please see preadmission assessment from earlier today as well.   Patient transferred to CIR on 03/10/2021 .    Patient currently requires min with basic self-care skills secondary to muscle weakness, decreased cardiorespiratoy endurance, impaired timing and sequencing, decreased coordination and decreased motor planning and decreased sitting balance, decreased standing balance, decreased postural control and decreased balance strategies.  Prior to hospitalization, patient could complete BADL with supervision to occasional Min A d/t fatigue.   Patient will benefit from skilled intervention to decrease level of assist with basic self-care skills and  increase independence with  basic self-care skills prior to discharge home with care partner.  Anticipate patient will require 24 hour supervision and follow up home health.  OT - End of Session Activity Tolerance: Tolerates 30+ min activity with multiple rests Endurance Deficit: Yes OT Assessment OT Patient demonstrates impairments in the following area(s): Balance;Motor;Pain;Safety;Sensory;Perception OT Basic ADL's Functional Problem(s): Grooming;Bathing;Dressing;Toileting OT Transfers Functional Problem(s): Toilet;Tub/Shower OT Additional Impairment(s): None OT Plan OT Intensity: Minimum of 1-2 x/day, 45 to 90 minutes OT Frequency: 5 out of 7 days OT Duration/Estimated Length of Stay: 7 days OT Treatment/Interventions: Balance/vestibular training;Discharge planning;Pain management;Self Care/advanced ADL retraining;Therapeutic Activities;UE/LE Coordination activities;Disease mangement/prevention;Functional mobility training;Patient/family education;Skin care/wound managment;Therapeutic Exercise;Wheelchair propulsion/positioning;UE/LE Strength taining/ROM;Splinting/orthotics;Psychosocial support;Neuromuscular re-education;DME/adaptive equipment instruction;Community reintegration OT Self Feeding Anticipated Outcome(s): no goal OT Basic Self-Care Anticipated Outcome(s): Supervision OT Toileting Anticipated Outcome(s): Supervision OT Bathroom Transfers Anticipated Outcome(s): Supervision OT Recommendation Patient destination: Home Follow Up Recommendations: Home health OT   OT Evaluation Precautions/Restrictions  Precautions Precautions: Fall Precaution Comments: hx falls resulting in scapula, hip and tib fracture within past 4 months Restrictions Weight Bearing Restrictions: No Pain Pain Assessment Pain Scale: 0-10 Pain Score: 5  Pain Type: Chronic pain Pain Location: Leg Pain Orientation: Left Pain Descriptors / Indicators: Aching;Tingling Pain Onset: With Activity Pain  Intervention(s): RN made aware;Repositioned;Rest Multiple Pain Sites: No Home Living/Prior Functioning Home Living Family/patient expects to be discharged to:: Private residence Living Arrangements: Spouse/significant other Available Help at Discharge: Family,Available 24 hours/day Type of Home: House Home Access: Stairs to enter,Ramped entrance Entrance Stairs-Number of Steps: 3 Entrance Stairs-Rails: Right Home Layout: Two level,Able to live on main level with bedroom/bathroom,Full bath on main level Alternate Level Stairs-Number of Steps: has full flight to 2nd level but does not need to use Alternate Level Stairs-Rails: Left Bathroom Shower/Tub: Multimedia programmer: Standard Bathroom Accessibility: Yes Additional Comments: Has an adjustable bed  Lives With: Spouse IADL History Homemaking Responsibilities: No Prior Function Level of Independence: Needs assistance with gait,Needs assistance with tranfers,Needs assistance with ADLs,Requires assistive device for independence (performance fluctuated d/t energy level 2/2 CA)  Able to Take Stairs?: Yes Driving: No Vocation: Retired Leisure: Hobbies-yes (Comment) (being outside) Comments: amb with rollator Vision Baseline Vision/History: Wears glasses Wears Glasses: At all times Patient Visual Report: No change from baseline Perception  Perception: Within Functional Limits Praxis Praxis: Intact Cognition Overall Cognitive Status: Within Functional Limits for tasks assessed Arousal/Alertness: Awake/alert Orientation Level: Person;Place;Situation Person: Oriented Place: Oriented Situation: Oriented Year: 2022 Month: May Day of Week: Correct Memory: Appears intact Immediate Memory Recall: Sock;Blue;Bed Memory Recall Sock: With Cue Memory Recall Blue: Without Cue Memory Recall Bed: Not able to recall Attention: Focused;Sustained Focused Attention: Appears intact Sustained Attention: Appears intact Awareness:  Appears intact Problem Solving: Appears intact Safety/Judgment: Appears intact Sensation Sensation Light Touch: Impaired by gross assessment (decreased sensation distally (worse in feet - hypersensitive)) Coordination Gross Motor Movements are Fluid and Coordinated: No Fine Motor Movements are Fluid and Coordinated: Yes Coordination and Movement Description: mildly uncoordinated d/t decreased strength and endurance Motor  Motor Motor: Within Functional Limits Motor - Skilled Clinical Observations: limited with global weakness and fatigue  Trunk/Postural Assessment  Cervical Assessment Cervical Assessment: Within Functional Limits Thoracic Assessment Thoracic Assessment: Within Functional Limits Lumbar Assessment Lumbar Assessment: Within Functional Limits Postural Control Postural Control: Deficits on evaluation Postural Limitations: decreased standing balance with decreased righting reactions without AD  Balance Balance Balance Assessed: Yes Standardized Balance Assessment Standardized Balance Assessment: Timed Up and Go Test Timed Up and Go Test TUG: Normal  TUG Normal TUG (seconds): 63 (only one rep 2/2 fatigue) Static Sitting Balance Static Sitting - Balance Support: No upper extremity supported;Feet supported Static Sitting - Level of Assistance: 5: Stand by assistance Dynamic Sitting Balance Dynamic Sitting - Balance Support: No upper extremity supported;Feet supported Dynamic Sitting - Level of Assistance: 5: Stand by assistance Dynamic Sitting - Balance Activities: Lateral lean/weight shifting;Forward lean/weight shifting;Reaching across midline Static Standing Balance Static Standing - Balance Support: Bilateral upper extremity supported Static Standing - Level of Assistance: 5: Stand by assistance Dynamic Standing Balance Dynamic Standing - Balance Support: Bilateral upper extremity supported;During functional activity Dynamic Standing - Level of Assistance: 4:  Min assist Extremity/Trunk Assessment RUE Assessment RUE Assessment: Within Functional Limits LUE Assessment LUE Assessment: Exceptions to [facility] General Strength Comments: ROM WFL but strength roughly 3+ to 4-/5  Care Tool Care Tool Self Care Eating    Indep    Oral Care    Oral Care Assist Level: Set up assist    Bathing   Body parts bathed by patient: Right arm;Left arm;Chest;Abdomen;Front perineal area;Right upper leg;Left upper leg;Face;Buttocks (simulated) Body parts bathed by helper: Right lower leg;Left lower leg   Assist Level: Minimal Assistance - Patient > 75%    Upper Body Dressing(including orthotics)   What is the patient wearing?: Pull over shirt   Assist Level: Supervision/Verbal cueing    Lower Body Dressing (excluding footwear)   What is the patient wearing?: Pants Assist for lower body dressing: Minimal Assistance - Patient > 75% (simulated)    Putting on/Taking off footwear   What is the patient wearing?: Non-skid slipper socks Assist for footwear: Moderate Assistance - Patient 50 - 74%       Care Tool Toileting Toileting activity   Assist for toileting: Minimal Assistance - Patient > 75%     Care Tool Bed Mobility Roll left and right activity   Roll left and right assist level: Supervision/Verbal cueing    Sit to lying activity   Sit to lying assist level: Contact Guard/Touching assist    Lying to sitting edge of bed activity   Lying to sitting edge of bed assist level: Contact Guard/Touching assist     Care Tool Transfers Sit to stand transfer   Sit to stand assist level: Contact Guard/Touching assist    Chair/bed transfer   Chair/bed transfer assist level: Contact Guard/Touching assist     Toilet transfer   Assist Level: Contact Guard/Touching assist     Care Tool Cognition Expression of Ideas and Wants Expression of Ideas and Wants: Without difficulty (complex and basic) - expresses complex messages without difficulty and with  speech that is clear and easy to understand   Understanding Verbal and Non-Verbal Content Understanding Verbal and Non-Verbal Content: Understands (complex and basic) - clear comprehension without cues or repetitions   Memory/Recall Ability *first 3 days only Memory/Recall Ability *first 3 days only: Current season;Location of own room;That he or she is in a hospital/hospital unit    Refer to Care Plan for [place] 1 OT Short Term Goal 1 (Week 1): STGs = LTGs d/t ELOS at Supervision  Recommendations for other services: None    Skilled Therapeutic Intervention ADL ADL Eating: Not assessed Grooming: Setup Upper Body Bathing: Supervision/safety Lower Body Bathing: Minimal assistance Upper Body Dressing: Supervision/safety Lower Body Dressing: Minimal assistance Toileting: Minimal assistance Toilet Transfer: Contact guard Toilet Transfer Method: Ambulating Walk-In Shower Transfer: Contact guard Mobility  Bed Mobility Bed Mobility: Rolling  Right;Rolling Left;Supine to Sit;Sit to Supine;Scooting to [facility];Sitting - Scoot to [employer] of Bed Rolling Right: Supervision/verbal cueing Rolling Left: Supervision/Verbal cueing Supine to Sit: Contact Guard/Touching assist Sitting - Scoot to Edge of Bed: Supervision/Verbal cueing Sit to Supine: Contact Guard/Touching assist Scooting to HOB: Contact Guard/Touching assist Transfers Sit to Stand: Contact Guard/Touching assist Stand to Sit: Contact Guard/Touching assist   Skilled Interventions: Pt greeted at time of session semireclined in bed with sister present agreeable to OT session and eval. Explained role and purpose of OT as well as CIR structure. See above and below for details.  Bed mobility CGA/Supervision for supine <> sit and sat EOB approx 15 minutes for multiple BP readings as pt lightheaded sitting up. BP 105/67 sitting, 82/54 standing, and 115/69 after approx 5 mins of standing activity. Simulated ADLs  this date as pt already dressed and politely declined bathing. All functional mobility and transfers CGA with RW and discussed use of rollator at home. Wheelchair transport throughout hall as well for tour of facility. Educated on energy conservation and pt eager for advice. Pt in bed resting call bell in reach all needs met.    Discharge Criteria: Patient will be discharged from OT if patient refuses treatment 3 consecutive times without medical reason, if treatment goals not met, if there is a change in medical status, if patient makes no progress towards goals or if patient is discharged from hospital.  The above assessment, treatment plan, treatment alternatives and goals were discussed and mutually agreed upon: by patient and by family  Viona Gilmore 03/11/2021, 12:27 PM

## 2021-03-12 DIAGNOSIS — R5381 Other malaise: Secondary | ICD-10-CM | POA: Diagnosis not present

## 2021-03-12 MED ORDER — TRAMADOL HCL 50 MG PO TABS
50.0000 mg | ORAL_TABLET | Freq: Four times a day (QID) | ORAL | Status: DC
  Administered 2021-03-12 – 2021-03-15 (×12): 50 mg via ORAL
  Filled 2021-03-12 (×12): qty 1

## 2021-03-12 MED ORDER — LEVETIRACETAM 250 MG PO TABS
250.0000 mg | ORAL_TABLET | Freq: Two times a day (BID) | ORAL | Status: DC
  Administered 2021-03-13 – 2021-03-15 (×5): 250 mg via ORAL
  Filled 2021-03-12 (×5): qty 1

## 2021-03-12 MED ORDER — ACETAMINOPHEN 500 MG PO TABS
500.0000 mg | ORAL_TABLET | Freq: Four times a day (QID) | ORAL | Status: DC
  Administered 2021-03-12 – 2021-03-17 (×21): 500 mg via ORAL
  Filled 2021-03-12 (×20): qty 1

## 2021-03-12 MED ORDER — CELECOXIB 100 MG PO CAPS
100.0000 mg | ORAL_CAPSULE | Freq: Two times a day (BID) | ORAL | Status: DC
Start: 2021-03-12 — End: 2021-03-17
  Administered 2021-03-12 – 2021-03-17 (×10): 100 mg via ORAL
  Filled 2021-03-12 (×10): qty 1

## 2021-03-12 NOTE — Progress Notes (Addendum)
Physical Therapy Session Note  Patient Details  Name: Bruce Smith MRN: [id number] Date of Birth: 10/21/1951  Today's Date: 03/12/2021 PT Individual Time: 0800-0900 and 6237-6283 PT Individual Time Calculation (min): 60 min and 40 mins  Short Term Goals: Week 1:  PT Short Term Goal 1 (Week 1): STG=LTG 2/2 ELOS  Skilled Therapeutic Interventions/Progress Updates:    session 1: pt received in bed and agreeable to therapy. Sister present. Pt directed in supine>sit CGA, sat EOB SBA, Stand pivot transfer to [facility] with Rolling walker CGA. Pt then directed in WC mobility 150' CGA with VC for technique for propulsion and directional changes. Pt required total A to complete rest of distance to gym for energy. Pt then directed seated BUE strengthening exercises x10 orange theraband of bicep curls, tricep extension, shoulder horizontal abduction; Pt directed in seated bilateral lower extremity strengthening exercises with orange theraband x10: hamstring curls, LAQ,, hip abduction, hip adduction for improved strength, tolerance to activity, and improved ambulation ability. Pt then directed in gait training with Rolling walker for 75' CGA with VC for increased step height and increased stride length. Pt then reported increased pain in BLE and declined to attempt further gait training. Pt then returned to room total A for time and energy conservation. Pt directed in Stand pivot transfer to bedside with Rolling walker at CGA and SBA sit>supine. Pt left in bed, All needs in reach and in good condition. Call light in hand.   Session 2: pt received in bed and agreeable to therapy. Pt reported 9/10 pain in BLE and increased "tingling" in both legs as well. Pt agreeable to seated exercises but reported his legs were in too much pain to attempt gait training. Pt directed in supine>sit SBA and sat EOB for all activity during session at SBA. Pt then directed in seated seated BUE strengthening exercises 2x10 orange theraband of  bicep curls, chest press, no resistance with horizontal shoulder abduction, no resistance knee extension on RLE. Pt then requested to return to supine, at SBA. Pt left in bed,  All needs in reach and in good condition. Call light in hand.  Sister present. Pt educated on energy conservation, HEP for home.   Attempted to see pt for additional time at 1520 however pt declined. Pt missed 20 mins of PT during this date.    Therapy Documentation Precautions:  Precautions Precautions: Fall Precaution Comments: hx falls resulting in scapula, hip and tib fracture within past 4 months Restrictions Weight Bearing Restrictions: No General:   Vital Signs: Therapy Vitals Temp: 98.4 F (36.9 C) Pulse Rate: 85 Resp: 18 BP: 130/72 Patient Position (if appropriate): Sitting Oxygen Therapy SpO2: 100 % O2 Device: Room Air Pain: Pain Assessment Pain Scale: 0-10 Pain Score: 0-No pain Mobility:   Locomotion :    Trunk/Postural Assessment :    Balance:   Exercises:   Other Treatments:      Therapy/Group: Individual Therapy  Junie Panning 03/12/2021, 3:22 PM

## 2021-03-12 NOTE — Progress Notes (Signed)
Occupational Therapy Session Note  Patient Details  Name: Bruce Smith MRN: [id number] Date of Birth: 1950-12-01  Today's Date: 03/12/2021 OT Individual Time: 1001-1058 OT Individual Time Calculation (min): 57 min   Short Term Goals: Week 1:  OT Short Term Goal 1 (Week 1): STGs = LTGs d/t ELOS at Supervision  Skilled Therapeutic Interventions/Progress Updates:    Pt greeted in bed, c/o neuropathic pain in feet but not yet due for pain medicine. Opting to defer walking until PM therapy session so that he can receive his pain medicine beforehand. Supine<sit completed unassisted with HOB elevated, pt using the bedrail. Min A to don shoes while sitting EOB. Pt reports using a shoe horn at home. Close supervision for stand pivot<w/c using his RW. ADL needs were met and pt verbalized wanting to go outside. Escorted him via w/c to the outdoor patio area. Worked on [employer] strengthening and activity tolerance while navigating w/c over uneven terrain, maintaining straight path when appropriate. Pt needed to use compensatory strategies to make Rt turns due to Lt weakness. Increased time for pt to negotiate up inclines with rest breaks as needed. Continued working on [employer] and activity tolerance while he self propelled w/c in the atrium and gift shop areas. Pt able to buy an anniversary gift for his wife during session. He was then returned to the room via w/c and completed stand pivot<bed using RW with supervision assist. We discussed energy conservation principles to utilize during functional tasks upon returning home, pt already equipped with energy conservation handout from previous OT. Pt was left with all needs within reach and 4 bedrails up of air mattress.   Therapy Documentation Precautions:  Precautions Precautions: Fall Precaution Comments: hx falls resulting in scapula, hip and tib fracture within past 4 months Restrictions Weight Bearing Restrictions: No Vital Signs: Therapy  Vitals Temp: 98.4 F (36.9 C) Pulse Rate: 85 Resp: 18 BP: 130/72 Patient Position (if appropriate): Sitting Oxygen Therapy SpO2: 100 % O2 Device: Room Air   ADL: ADL Eating: Not assessed Grooming: Setup Upper Body Bathing: Supervision/safety Lower Body Bathing: Minimal assistance Upper Body Dressing: Supervision/safety Lower Body Dressing: Minimal assistance Toileting: Minimal assistance Toilet Transfer: Contact guard Toilet Transfer Method: Ambulating Walk-In Shower Transfer: Contact guard      Therapy/Group: Individual Therapy  Larcenia Holaday A Luticia Tadros 03/12/2021, 3:56 PM

## 2021-03-12 NOTE — IPOC Note (Signed)
Overall Plan of Care [facility]) Patient Details Name: Bruce Smith MRN: [id number] DOB: Apr 13, 1951  Admitting Diagnosis: Physical debility  Hospital Problems: Principal Problem:   Physical debility     Functional Problem List: Nursing Bladder,Edema,Pain,Bowel  PT Balance,Endurance,Pain,Nutrition,Safety,Skin Integrity  OT Balance,Motor,Pain,Safety,Sensory,Perception  SLP    TR         Basic ADL's: OT Grooming,Bathing,Dressing,Toileting     Advanced  ADL's: OT       Transfers: PT Bed Mobility,Bed to Chair,Car,Furniture  OT Toilet,Tub/Shower     Locomotion: PT Ambulation,Wheelchair Mobility,Stairs     Additional Impairments: OT None  SLP        TR      Anticipated Outcomes Item Anticipated Outcome  Self Feeding no goal  Swallowing      Basic self-care  Supervision  Toileting  Supervision   Bathroom Transfers Supervision  Bowel/Bladder  Mod I  Transfers  supervision with LRAD  Locomotion  supervision with LRAD for household distances  Communication     Cognition     Pain  <3  Safety/Judgment  Mod I and no falls   Therapy Plan: PT Intensity: Minimum of 1-2 x/day ,45 to 90 minutes PT Frequency: 5 out of 7 days PT Duration Estimated Length of Stay: 5-7 days OT Intensity: Minimum of 1-2 x/day, 45 to 90 minutes OT Frequency: 5 out of 7 days OT Duration/Estimated Length of Stay: 7 days     Due to the current state of emergency, patients may not be receiving their 3-hours of Medicare-mandated therapy.   Team Interventions: Nursing Interventions Bladder Management,Bowel Management,Pain Management  PT interventions Ambulation/gait training,Balance/vestibular training,Cognitive remediation/compensation,Community reintegration,Discharge planning,Disease management/prevention,DME/adaptive equipment instruction,Functional electrical stimulation,Functional mobility training,Pain management,Patient/family education,Psychosocial support,Skin care/wound  management,Splinting/orthotics,Stair training,Wheelchair propulsion/positioning,Visual/perceptual remediation/compensation,UE/LE Coordination activities,UE/LE Strength taining/ROM,Therapeutic Exercise,Therapeutic Activities  OT Interventions Balance/vestibular training,Discharge planning,Pain management,Self Care/advanced ADL retraining,Therapeutic Activities,UE/LE Coordination activities,Disease mangement/prevention,Functional mobility training,Patient/family education,Skin care/wound managment,Therapeutic Exercise,Wheelchair propulsion/positioning,UE/LE Strength taining/ROM,Splinting/orthotics,Psychosocial support,Neuromuscular re-education,DME/adaptive equipment instruction,Community reintegration  SLP Interventions    TR Interventions    SW/CM Interventions Discharge Planning,Psychosocial Support,Disease Management/Prevention,Patient/Family Education   Barriers to Discharge MD  Medical stability, Home enviroment access/loayout, Weight, Behavior and Nutritional means  Nursing Incontinence    PT Weight,Nutrition means    OT      SLP      SW Insurance for SNF coverage     Team Discharge Planning: Destination: PT-Home ,OT- Home , SLP-  Projected Follow-up: PT-Home health PT, OT-  Home health OT, SLP-  Projected Equipment Needs: PT-To be determined, OT-  , SLP-  Equipment Details: PT- , OT-  Patient/family involved in discharge planning: PT- Patient,Family member/caregiver,  OT-Patient,Family member/caregiver, SLP-   MD ELOS: 5-7 days Medical Rehab Prognosis:  Good Assessment: Pt is a 70 yr old male with multiple myeloma s/p stem cell transplant with Failure to thrive, weakness/Debility; HTN, UTI on Amoxicillin, and neuropathy which limits therapy progress. Goals supervision to mod I by d/c.     See Team Conference Notes for weekly updates to the plan of care

## 2021-03-12 NOTE — Progress Notes (Signed)
Daily Progress Note   Patient Name: Bruce Smith       Date: 03/12/2021 DOB: April 19, 1951  Age: 70 y.o. MRN#: [id number] Attending Physician: Courtney Heys, MD Primary Care Physician: Derrill Center., MD Admit Date: 03/10/2021  Reason for Consultation/Follow-up: Establishing goals of care  Subjective: Pain in legs continues, worse after working with therapy this AM. Otherwise, feels well. Feels like he is doing much better.  Length of Stay: 2  Current Medications: Scheduled Meds:  . acetaminophen  500 mg Oral Q6H  . acyclovir  400 mg Oral BID  . amoxicillin  500 mg Oral Q8H  . celecoxib  100 mg Oral BID  . enoxaparin (LOVENOX) injection  40 mg Subcutaneous Q24H  . [START ON 03/13/2021] levETIRAcetam  250 mg Oral BID  . pantoprazole  40 mg Oral BID AC  . polycarbophil  625 mg Oral Daily  . pregabalin  100 mg Oral Daily  . pregabalin  150 mg Oral QHS  . saccharomyces boulardii  250 mg Oral BID  . tamsulosin  0.4 mg Oral Daily  . traMADol  50 mg Oral Q6H    Continuous Infusions:   PRN Meds: alum & mag hydroxide-simeth, bisacodyl, diphenhydrAMINE, guaiFENesin-dextromethorphan, loperamide, ondansetron, polyethylene glycol, prochlorperazine **OR** prochlorperazine **OR** prochlorperazine, sodium phosphate, traZODone  Physical Exam Constitutional:      General: He is not in acute distress. Pulmonary:     Effort: Pulmonary effort is normal.  Skin:    General: Skin is warm and dry.  Neurological:     Mental Status: He is alert and oriented to person, place, and time.  Psychiatric:        Mood and Affect: Mood normal.        Behavior: Behavior normal.             Vital Signs: BP (!) 117/59 (BP Location: Left Arm)   Pulse 75   Temp 98.2 F (36.8 C)   Resp 17   Wt 66.4 kg   SpO2 96%    BMI 22.26 kg/m  SpO2: SpO2: 96 % O2 Device: O2 Device: Room Air O2 Flow Rate:    Intake/output summary:   Intake/Output Summary (Last 24 hours) at 03/12/2021 0946 Last data filed at 03/12/2021 0500 Gross per 24 hour  Intake 1140 ml  Output --  Net 1140 ml   LBM: Last BM Date: 03/11/21 Baseline Weight: Weight: 66.4 kg Most recent weight: Weight: 66.4 kg       Palliative Assessment/Data: PPS 60%    Flowsheet Rows   Flowsheet Row Most Recent Value  Intake Tab   Referral Department --  [CIR]  Unit at Time of Referral Other (Comment)  [CIR]  Palliative Care Primary Diagnosis Cancer  Date Notified 03/10/21  Palliative Care Type Return patient Palliative Care  Reason for referral Pain  Date of Admission 03/10/21  Date first seen by Palliative Care 03/11/21  # of days Palliative referral response time 1 Day(s)  # of days IP prior to Palliative referral 0  Clinical Assessment   Psychosocial & Spiritual Assessment   Palliative Care Outcomes       Patient Active Problem List   Diagnosis Date Noted  . Physical debility  03/10/2021  . Debility   . Enterococcus UTI   . Pain in both feet   . Malnutrition of moderate degree 03/05/2021  . FTT (failure to thrive) in adult 02/23/2021  . AKI (acute kidney injury) ([place]) 10/29/2020  . Abnormal neurological exam 10/29/2020  . Essential hypertension 10/29/2020  . CAD (coronary artery disease) 10/29/2020  . Intractable pain 10/26/2020  . 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 Goals of care, counseling/discussion 06/29/2020  . 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 Cervical spondylosis with myelopathy and radiculopathy 06/17/2020    Palliative Care Assessment & Plan   HPI: 70 y.o. male  with past medical history of of HTN, esophageal stricture, MM with extensive disease and pathologic fractures left scapular and left femur who underwent ASCT at [facility] on  01/22/2021 admitted to [facility] 03/10/2021 following hospital stay 4/26 - 5/38for fall, weakness, poor appetite and N/V. Hospitalization complicated by enterococcus UTI, BLE pain, and delirium likely r/t narcotics. He required TPN during hospitalization 5/5-5/9. CIR was recommended d/t functional decline. PMT consulted to assist with pain management.   Assessment: Patient reports pain not well controlled. CIR team has made medication changes this am - initiated tramadol, decreased tylenol and celebrex. Patient and sister comfortable with this plan. They understand we are available if needed and have our contact information to reach out. Will defer further symptom management to CIR team.  Recommendations/Plan:  Defer further symptom management to CIR team  Family has our contact information and will reach out as needed  Goals of Care and Additional Recommendations:  Limitations on Scope of Treatment: Full Scope Treatment  Code Status:  Full code  Discharge Planning:  Home with [place] was discussed with patient and sister  Thank you for allowing the Palliative Medicine Team to assist in the care of this patient.   Total Time 15 minutes Prolonged Time Billed  no       Greater than 50%  of this time was spent counseling and coordinating care related to the above assessment and plan.  Juel Burrow, DNP, [facility] Palliative Medicine Team Team Phone # [phone]  Pager [phone]

## 2021-03-12 NOTE — Progress Notes (Signed)
PROGRESS NOTE   Subjective/Complaints:  Pt reports pain is NOT controlled- and got sick with Duloxetine yesterday and diarrhea from the MgOx- so will stop both of them.  Wants to use his Core power supplement from home, hates boost- will be ok with this.  Tramadol got stopped yesterday- and I'm concerned about celebrex dose- and tylenol dose long term- will change dosing around after d/w family- wife and sister and pt for prolonged discussion.  Also will try Keppra TOMORROW for nerve pain- if gets irritable, will need to try Vit B complex and if doesn't help, then will stop- will start with low dose.   ROS:  Pt denies SOB, abd pain, CP, N/V/C/D, and vision changes   Objective:   No results found. Recent Labs    03/10/21 0538 03/11/21 0519  WBC 5.1 5.1  HGB 9.8* 10.2*  HCT 30.1* 29.9*  PLT 182 189   Recent Labs    03/11/21 0519  NA 137  K 3.9  CL 111  CO2 22  GLUCOSE 96  BUN 6*  CREATININE 0.52*  CALCIUM 7.4*    Intake/Output Summary (Last 24 hours) at 03/12/2021 1716 Last data filed at 03/12/2021 1300 Gross per 24 hour  Intake 1260 ml  Output --  Net 1260 ml        Physical Exam: Vital Signs Blood pressure 130/72, pulse 85, temperature 98.4 F (36.9 C), resp. rate 18, weight 66.4 kg, SpO2 100 %.   General: awake, alert, appropriate, laying in bed- sister and wife at bedside; NAD HENT: conjugate gaze; oropharynx moist CV: regular rate; no JVD Pulmonary: CTA B/L; no W/R/R- good air movement GI: soft, NT, ND, (+)BS Psychiatric: appropriate; slightly flat and not speaking much- family does most of talking Neurological: Alert Musculoskeletal:        General: Swelling (mild pedal edema. ) present. No tenderness.     Cervical back: Normal range of motion and neck supple.     Comments: Bilateral heels boggy. Left great toe red and swollen--mildly tender to touch.   Skin:    General: Skin is warm and  dry.  Neurological: .     Comments: Alert Sensation diminished to light touch distal to b/l knees Motor: B/l UE: 5/5 proximal to distal B/l LE: HF, KE 2+/5, ADF 4/5    Assessment/Plan: 1. Functional deficits which require 3+ hours per day of interdisciplinary therapy in a comprehensive inpatient rehab setting.  Physiatrist is providing close team supervision and 24 hour management of active medical problems listed below.  Physiatrist and rehab team continue to assess barriers to discharge/monitor patient progress toward functional and medical goals  Care Tool:  Bathing    Body parts bathed by patient: Right arm,Left arm,Chest,Abdomen,Front perineal area,Right upper leg,Left upper leg,Face,Buttocks (simulated)   Body parts bathed by helper: Right lower leg,Left lower leg     Bathing assist Assist Level: Minimal Assistance - Patient > 75%     Upper Body Dressing/Undressing Upper body dressing   What is the patient wearing?: Pull over shirt    Upper body assist Assist Level: Supervision/Verbal cueing    Lower Body Dressing/Undressing Lower body dressing  What is the patient wearing?: Pants     Lower body assist Assist for lower body dressing: Minimal Assistance - Patient > 75% (simulated)     Toileting Toileting    Toileting assist Assist for toileting: Minimal Assistance - Patient > 75%     Transfers Chair/bed transfer  Transfers assist     Chair/bed transfer assist level: Minimal Assistance - Patient > 75%     Locomotion Ambulation   Ambulation assist      Assist level: Contact Guard/Touching assist Assistive device: Walker-rolling Max distance: 50   Walk 10 feet activity   Assist     Assist level: Contact Guard/Touching assist Assistive device: Walker-rolling   Walk 50 feet activity   Assist    Assist level: Contact Guard/Touching assist Assistive device: Walker-rolling    Walk 150 feet activity   Assist Walk 150 feet  activity did not occur: Safety/medical concerns         Walk 10 feet on uneven surface  activity   Assist Walk 10 feet on uneven surfaces activity did not occur: Safety/medical concerns         Wheelchair     Assist Will patient use wheelchair at discharge?: Yes Type of Wheelchair: Manual    Wheelchair assist level: Minimal Assistance - Patient > 75% Max wheelchair distance: 75    Wheelchair 50 feet with 2 turns activity    Assist        Assist Level: Minimal Assistance - Patient > 75%   Wheelchair 150 feet activity     Assist      Assist Level: Moderate Assistance - Patient 50 - 74%   Blood pressure 130/72, pulse 85, temperature 98.4 F (36.9 C), resp. rate 18, weight 66.4 kg, SpO2 100 %.    Medical Problem List and Plan: 1.  Weakness with fatigue, SOB with activity and poor posture with balance secondary to debility.             -patient may shower             -ELOS/Goals: Supervision/Mod I/8-13 days.             Admit to CIR  -con't PT and OT-  2.  Antithrombotics: -DVT/anticoagulation:  Pharmaceutical: Lovenox as thrombocytopenia has resolved.              -antiplatelet therapy: N/A 3. Pain Management: Celebrex bid with tylenol prn.             Monitor with increased exertion  4. Mood: LCSW to follow for evaluation and support.              -antipsychotic agents: N/A 5. Neuropsych: This patient is capable of making decisions on his own behalf. 6. Skin/Wound Care: Routine pressure relief measures.  7. Fluids/Electrolytes/Nutrition: Monitor I/Os.                          CMP ordered for tomorrow 8. FTT: Has had issues with N//V/Malaise since last fall due to chemo             --offer supplements between meals             --monitor for orthostatic symptoms.             --recheck Magnesium levels in am.  5/13- Mg level was 1.7- had diarrhea from MgOx even 1 dose- will stop and Boost, because he hates it- family to bring in "Core Power from  home"- and that's fine  9. Enterococcus UTI: On amoxacillin for 2 additional days. 10. Anemia of chronic disease:              CBC ordered for tomorrow AM 11. 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 HTN: Monitor BP tid--check orthostatic BP             Monitor with increased mobility 13. SOB: Encourage pulmonary hygiene.  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 Question gout flare v/s ingrown toe nail.              Increase meds as necessary  5/13- will d/c duloxetine since made nauseated; will decrease Celebrex to 100 mg BID and tylenol to 500 mg QID as well as add Tramadol 50 mg QID and tomorrow if tolerates new regimen, add keppra 250 mg BID for nerve pain- legs BURNING per pt.   I spent a total of 35 minutes on visit- today- >50% coordination of care- discussing pain options as well as nausea/diarrhea.   LOS: 2 days A FACE TO FACE EVALUATION WAS PERFORMED  Randal Yepiz 03/12/2021, 5:16 PM

## 2021-03-12 NOTE — Plan of Care (Signed)
  Problem: Consults Goal: RH GENERAL PATIENT EDUCATION Description: See Patient Education module for education specifics. Outcome: Progressing Goal: Nutrition Consult-if indicated Outcome: Progressing   Problem: RH SAFETY Goal: RH STG ADHERE TO SAFETY PRECAUTIONS W/ASSISTANCE/DEVICE Description: STG Adhere to Safety Precautions With Mod I Assistance. Outcome: Progressing Goal: RH STG DECREASED RISK OF FALL WITH ASSISTANCE Description: STG Decreased Risk of Fall With cues and reminders. Outcome: Progressing   Problem: RH PAIN MANAGEMENT Goal: RH STG PAIN MANAGED AT OR BELOW PT'S PAIN GOAL Description: < 3 on a 0-10 pain scale. Outcome: Progressing   Problem: RH KNOWLEDGE DEFICIT GENERAL Goal: RH STG INCREASE KNOWLEDGE OF SELF CARE AFTER HOSPITALIZATION Description: Patient will be able to demonstrate knowledge of medication management, pain management, skin/wound care with educational materials and handouts provided by staff, at discharge independently. Outcome: Progressing   

## 2021-03-13 DIAGNOSIS — R5381 Other malaise: Secondary | ICD-10-CM | POA: Diagnosis not present

## 2021-03-13 NOTE — Progress Notes (Signed)
Occupational Therapy Session Note  Patient Details  Name: Bruce Smith MRN: [id number] Date of Birth: December 02, 1950  Today's Date: 03/13/2021 OT Individual Time: 4888-9169 and 1005-1100 OT Individual Time Calculation (min): 56 min and 55 min  Short Term Goals: Week 1:  OT Short Term Goal 1 (Week 1): STGs = LTGs d/t ELOS at Supervision  Skilled Therapeutic Interventions/Progress Updates:    Pt greeted in bed with sister Bruce Smith present at bedside. Pt was premedicated for pain, agreeable to session with ADL needs met. Therefore session focus was placed on d/c planning and caregiver education. Practiced simulated walk-in shower transfers in the ADL apartment. Pt already has a shower seat with back at home. Bruce Smith had hands on practice providing CGA while pt used the RW to transfer in/out of simulated shower. They have suction grab bars and OT advised replacing them with manually installed ones. Also advised for pt to wear nonslip footwear in/out of shower to minimize his fall risk at home. Pt and Bruce Smith verbalized understanding. Note that pt reported feeling a little dizzy in standing. Reports this happens during standing activity at times. BP while sitting in the w/c after 100/64. With MD consent, [name] stockings were donned. OT taught Bruce Smith 2 adaptive methods for donning [name] hose to increase ease when assisting pt with this aspect of ADLs. Pt used the RW to transfer back to bed with CGA. Due to BP running high at rest, Teds were removed. Pt remained in bed at close of session, all needs within reach and 4 bedrails up of air mattress.   Pt also self propelled w/c to/from all therapeutic destinations, needed rest breaks and increased time to correct Lt veering due to UE weakness Lt>Rt.   2nd Session 1:1 tx (55 min) Pt greeted in bed, asleep, easily woken and agreeable to tx. Premedicated for neuropathic pain. Supine<sit completed unassisted, pt using the bedrails after OT donned [name] hose. While EOB, OT educated pt on  using the sock aide to increase his functional independence with donning footwear. Pt able to return carryover of education given setup assist on 2nd attempt. He then reported feeling "moderately" dizzy, BP: 91/57. He needed to use the restroom, felt well enough to ambulate. Close supervision-CGA for transfer using RW with pt having BM, supervision for toileting tasks. He then returned to EOB and used sanitizer to wash hands. BP: 82/60 with pt still feeling "moderately" dizzy. He returned to bed and OT placed him in trendelenburg position. BP post 3 minutes 117/75. Pt agreeable to bedlevel exercises. Guided him first through heel slides bilaterally x10 reps 2 sets slightly against gravity. BP after 116/68. Placed pt supine and instructed him how to use the gait belt to independently perform hamstring stretches, technique modified on the Lt LE due to neuropathic pain. Pt raised HOB and BP reassessed, 127/67 with dizziness reportedly absolved. Pt remained in bed at close of session, all needs within reach and 4 bedrails up. Notified RN of pts orthostatics today.   Therapy Documentation Precautions:  Precautions Precautions: Fall Precaution Comments: hx falls resulting in scapula, hip and tib fracture within past 4 months Restrictions Weight Bearing Restrictions: No ADL: ADL Eating: Not assessed Grooming: Setup Upper Body Bathing: Supervision/safety Lower Body Bathing: Minimal assistance Upper Body Dressing: Supervision/safety Lower Body Dressing: Minimal assistance Toileting: Minimal assistance Toilet Transfer: Contact guard Toilet Transfer Method: Ambulating Walk-In Shower Transfer: Contact guard      Therapy/Group: Individual Therapy  Mitali Shenefield A Colbey Wirtanen 03/13/2021, 12:46 PM

## 2021-03-13 NOTE — Plan of Care (Signed)
  Problem: Consults Goal: RH GENERAL PATIENT EDUCATION Description: See Patient Education module for education specifics. Outcome: Progressing Goal: Nutrition Consult-if indicated Outcome: Progressing   Problem: RH SAFETY Goal: RH STG ADHERE TO SAFETY PRECAUTIONS W/ASSISTANCE/DEVICE Description: STG Adhere to Safety Precautions With Mod I Assistance. Outcome: Progressing Goal: RH STG DECREASED RISK OF FALL WITH ASSISTANCE Description: STG Decreased Risk of Fall With cues and reminders. Outcome: Progressing   Problem: RH PAIN MANAGEMENT Goal: RH STG PAIN MANAGED AT OR BELOW PT'S PAIN GOAL Description: < 3 on a 0-10 pain scale. Outcome: Progressing   Problem: RH KNOWLEDGE DEFICIT GENERAL Goal: RH STG INCREASE KNOWLEDGE OF SELF CARE AFTER HOSPITALIZATION Description: Patient will be able to demonstrate knowledge of medication management, pain management, skin/wound care with educational materials and handouts provided by staff, at discharge independently. Outcome: Progressing   

## 2021-03-13 NOTE — Progress Notes (Signed)
PROGRESS NOTE   Subjective/Complaints:  Pain under better control   ROS:  Pt denies SOB, abd pain, CP, N/V/C/D, and vision changes   Objective:   No results found. Recent Labs    03/11/21 0519  WBC 5.1  HGB 10.2*  HCT 29.9*  PLT 189   Recent Labs    03/11/21 0519  NA 137  K 3.9  CL 111  CO2 22  GLUCOSE 96  BUN 6*  CREATININE 0.52*  CALCIUM 7.4*    Intake/Output Summary (Last 24 hours) at 03/13/2021 0809 Last data filed at 03/13/2021 0600 Gross per 24 hour  Intake 1500 ml  Output 200 ml  Net 1300 ml        Physical Exam: Vital Signs Blood pressure 108/61, pulse 73, temperature 98.7 F (37.1 C), resp. rate 17, weight 66.4 kg, SpO2 100 %.   General: awake, alert, appropriate, laying in bed- sister and wife at bedside; NAD HENT: conjugate gaze; oropharynx moist CV: regular rate; no JVD Pulmonary: CTA B/L; no W/R/R- good air movement GI: soft, NT, ND, (+)BS Psychiatric: appropriate; slightly flat and not speaking much- family does most of talking Neurological: Alert Musculoskeletal:        General: Swelling (mild pedal edema. ) present. No tenderness.     Cervical back: Normal range of motion and neck supple.     Comments: Bilateral heels boggy. Left great toe red and swollen--mildly tender to touch.   Skin:    General: Skin is warm and dry.  Neurological: .     Comments: Alert Sensation diminished to light touch distal to b/l knees Motor: B/l UE: 5/5 proximal to distal B/l LE: HF, KE 2+/5, ADF 4/5    Assessment/Plan: 1. Functional deficits which require 3+ hours per day of interdisciplinary therapy in a comprehensive inpatient rehab setting.  Physiatrist is providing close team supervision and 24 hour management of active medical problems listed below.  Physiatrist and rehab team continue to assess barriers to discharge/monitor patient progress toward functional and medical goals  Care  Tool:  Bathing    Body parts bathed by patient: Right arm,Left arm,Chest,Abdomen,Front perineal area,Right upper leg,Left upper leg,Face,Buttocks (simulated)   Body parts bathed by helper: Right lower leg,Left lower leg     Bathing assist Assist Level: Minimal Assistance - Patient > 75%     Upper Body Dressing/Undressing Upper body dressing   What is the patient wearing?: Pull over shirt    Upper body assist Assist Level: Supervision/Verbal cueing    Lower Body Dressing/Undressing Lower body dressing      What is the patient wearing?: Pants     Lower body assist Assist for lower body dressing: Minimal Assistance - Patient > 75% (simulated)     Toileting Toileting    Toileting assist Assist for toileting: Minimal Assistance - Patient > 75%     Transfers Chair/bed transfer  Transfers assist     Chair/bed transfer assist level: Minimal Assistance - Patient > 75%     Locomotion Ambulation   Ambulation assist      Assist level: Contact Guard/Touching assist Assistive device: Walker-rolling Max distance: 50   Walk 10 feet activity  Assist     Assist level: Contact Guard/Touching assist Assistive device: Walker-rolling   Walk 50 feet activity   Assist    Assist level: Contact Guard/Touching assist Assistive device: Walker-rolling    Walk 150 feet activity   Assist Walk 150 feet activity did not occur: Safety/medical concerns         Walk 10 feet on uneven surface  activity   Assist Walk 10 feet on uneven surfaces activity did not occur: Safety/medical concerns         Wheelchair     Assist Will patient use wheelchair at discharge?: Yes Type of Wheelchair: Manual    Wheelchair assist level: Minimal Assistance - Patient > 75% Max wheelchair distance: 75    Wheelchair 50 feet with 2 turns activity    Assist        Assist Level: Minimal Assistance - Patient > 75%   Wheelchair 150 feet activity     Assist       Assist Level: Moderate Assistance - Patient 50 - 74%   Blood pressure 108/61, pulse 73, temperature 98.7 F (37.1 C), resp. rate 17, weight 66.4 kg, SpO2 100 %.    Medical Problem List and Plan: 1.  Weakness with fatigue, SOB with activity and poor posture with balance secondary to debility.             -patient may shower             -ELOS/Goals: Supervision/Mod I/8-13 days.             Admit to CIR  -con't PT and OT-  2.  Antithrombotics: -DVT/anticoagulation:  Pharmaceutical: Lovenox as thrombocytopenia has resolved.              -antiplatelet therapy: N/A 3. Pain Management: Celebrex bid with tylenol prn.             Monitor with increased exertion  4. Mood: LCSW to follow for evaluation and support.              -antipsychotic agents: N/A 5. Neuropsych: This patient is capable of making decisions on his own behalf. 6. Skin/Wound Care: Routine pressure relief measures.  7. Fluids/Electrolytes/Nutrition: Monitor I/Os.                          CMP ordered for tomorrow 8. FTT: Has had issues with N//V/Malaise since last fall due to chemo             --offer supplements between meals             --monitor for orthostatic symptoms.             --recheck Magnesium levels in am.  5/13- Mg level was 1.7- had diarrhea from MgOx even 1 dose- will stop and Boost, because he hates it- family to bring in "[employer] Power from home"- and that's fine  9. Enterococcus UTI: On amoxacillin for 2 additional days. 10. Anemia of chronic disease:              CBC ordered for tomorrow AM 11. 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 HTN: Monitor BP tid--check orthostatic BP        Vitals:   03/12/21 1949 03/13/21 0438  BP: (!) 141/82 108/61  Pulse: 75 73  Resp: 20 17  Temp: 97.6 F (36.4 C) 98.7 F (37.1 C)  SpO2: 99% 100%  controlled 5/14 13. SOB: Encourage pulmonary hygiene.  14.  Chemo associated neuropathy?:  Reports burning/tingling BLE. Question gout flare v/s ingrown toe nail.             Improving 5/14  5/13- will d/c duloxetine since made nauseated; will decrease Celebrex to 100 mg BID and tylenol to 500 mg QID as well as add Tramadol 50 mg QID and tomorrow if tolerates new regimen, add keppra 250 mg BID for nerve pain- legs BURNING per pt.     LOS: 3 days A FACE TO [place] E Alizah Sills 03/13/2021, 8:09 AM

## 2021-03-13 NOTE — Progress Notes (Addendum)
Physical Therapy Session Note  Patient Details  Name: Bruce Smith MRN: [id number] Date of Birth: [birth year]  Today's Date: 03/13/2021 PT Individual Time: 1445-1600 PT Individual Time Calculation (min): 75 min   Short Term Goals: Week 1:  PT Short Term Goal 1 (Week 1): STG=LTG 2/2 ELOS  Skilled Therapeutic Interventions/Progress Updates:  Pt resting in bed; wife and dtr present.  He stated chronic pain bil LLEs was 8/10, achy/nerve type pain.  Premedicated, and also received Tylenol and drank 5 oz water  during session.  Pt's dtr stated that he was hypotensive before lunch.  Abdominal binder on order, per Nsg.  BP in supine = 128/65.  Supine> sit with supervision.  BP 110/66 with slight dizziness.  Pt stated that he needed to use urinal.  Bed> w/c stand pivot transfer with CGA.  Wife assisted pt in BR for seated voiding, using urinal.  Dizziness reduced, per pt.  wc propulsion for activity tolerance, supervision, using bil UEs, x 50' to tolerance, limited by scapular pain.   Gait training on level tile with RW x 50', with wc follow, CGA.  Distance limited by LLE pain.  Gait training with rollator x 75', seated rest, x 75', with CGA.  Pt safe with use of brakes and seat of rollator.  BP 111/71, mild dizziness.  Education for seated self stretchingbil heel cords, with knees flexed, feet on wedge for soleus mm; with L knee partially extended, L foot on footstool for gastrocnemius muscle.  Pt held stretch positions x 30 seconds x 4.   Standing with bil UE support for toe raises x 5, with LOB requiring use of rollator to bring weight forward. Hip and ankle balance strategies limited and inadequate.  Seated bil adductor squeezes with upright posture for core activation.    Wc> bed transfer stand pivot with CGA.  Sit> supine with extra time, supervision.  Pt scooted to [facility] with bridging and minimal use of UES. At end of session, pt resting in bed with needs at hand and wife present.  PT urged pt to  stay hydrated.      Therapy Documentation Precautions:  Precautions Precautions: Fall Precaution Comments: hx falls resulting in scapula, hip and tib fracture within past 4 months Restrictions Weight Bearing Restrictions: No        Therapy/Group: Individual Therapy  Jahvon Gosline 03/13/2021, 4:16 PM

## 2021-03-14 DIAGNOSIS — R5381 Other malaise: Secondary | ICD-10-CM | POA: Diagnosis not present

## 2021-03-14 NOTE — Progress Notes (Signed)
PROGRESS NOTE   Subjective/Complaints:  Mild dizziness at times with standing but does not feel like he is going to "pass out"  Started tamulosin 5/12-5/14- d/ced  ROS:  Pt denies SOB, abd pain, CP, N/V/C/D, and vision changes   Objective:   No results found. No results for input(s): WBC, HGB, HCT, PLT in the last 72 hours. No results for input(s): NA, K, CL, CO2, GLUCOSE, BUN, CREATININE, CALCIUM in the last 72 hours. No intake or output data in the 24 hours ending 03/14/21 0745      Physical Exam: Vital Signs Blood pressure 118/66, pulse 75, temperature 97.8 F (36.6 C), resp. rate 20, weight 66.4 kg, SpO2 99 %.  General: No acute distress Mood and affect are appropriate Heart: Regular rate and rhythm no rubs murmurs or extra sounds Lungs: Clear to auscultation, breathing unlabored, no rales or wheezes Abdomen: Positive bowel sounds, soft nontender to palpation, nondistended Extremities: No clubbing, cyanosis, or edema  Musculoskeletal:     No jt effusions    Cervical back: Normal range of motion and neck supple.     Comments: Bilateral heels boggy. Left great toe red and swollen--mildly tender to touch.   Skin:    General: Skin is warm and dry.  Neurological: .     Comments: Alert Sensation diminished to light touch distal to b/l knees Motor: B/l UE: 5/5 proximal to distal B/l LE: HF, KE 2+/5, ADF 4/5    Assessment/Plan: 1. Functional deficits which require 3+ hours per day of interdisciplinary therapy in a comprehensive inpatient rehab setting.  Physiatrist is providing close team supervision and 24 hour management of active medical problems listed below.  Physiatrist and rehab team continue to assess barriers to discharge/monitor patient progress toward functional and medical goals  Care Tool:  Bathing    Body parts bathed by patient: Right arm,Left arm,Chest,Abdomen,Front perineal area,Right upper  leg,Left upper leg,Face,Buttocks (simulated)   Body parts bathed by helper: Right lower leg,Left lower leg     Bathing assist Assist Level: Minimal Assistance - Patient > 75%     Upper Body Dressing/Undressing Upper body dressing   What is the patient wearing?: Pull over shirt    Upper body assist Assist Level: Supervision/Verbal cueing    Lower Body Dressing/Undressing Lower body dressing      What is the patient wearing?: Pants     Lower body assist Assist for lower body dressing: Minimal Assistance - Patient > 75% (simulated)     Toileting Toileting    Toileting assist Assist for toileting: Supervision/Verbal cueing     Transfers Chair/bed transfer  Transfers assist     Chair/bed transfer assist level: Contact Guard/Touching assist     Locomotion Ambulation   Ambulation assist      Assist level: Contact Guard/Touching assist Assistive device: Rollator Max distance: 75   Walk 10 feet activity   Assist     Assist level: Contact Guard/Touching assist Assistive device: Rollator   Walk 50 feet activity   Assist    Assist level: Contact Guard/Touching assist Assistive device: Rollator    Walk 150 feet activity   Assist Walk 150 feet activity did not occur: Safety/medical  concerns         Walk 10 feet on uneven surface  activity   Assist Walk 10 feet on uneven surfaces activity did not occur: Safety/medical concerns         Wheelchair     Assist Will patient use wheelchair at discharge?: Yes Type of Wheelchair: Manual    Wheelchair assist level: Supervision/Verbal cueing Max wheelchair distance: 50    Wheelchair 50 feet with 2 turns activity    Assist        Assist Level: Supervision/Verbal cueing   Wheelchair 150 feet activity     Assist      Assist Level: Moderate Assistance - Patient 50 - 74%   Blood pressure 118/66, pulse 75, temperature 97.8 F (36.6 C), resp. rate 20, weight 66.4 kg, SpO2 99  %.    Medical Problem List and Plan: 1.  Weakness with fatigue, SOB with activity and poor posture with balance secondary to debility.             -patient may shower             -ELOS/Goals: Supervision/Mod I/8-13 days.             Admit to CIR  -con't PT and OT-  2.  Antithrombotics: -DVT/anticoagulation:  Pharmaceutical: Lovenox as thrombocytopenia has resolved.              -antiplatelet therapy: N/A 3. Pain Management: Celebrex bid with tylenol prn.             Monitor with increased exertion  4. Mood: LCSW to follow for evaluation and support.              -antipsychotic agents: N/A 5. Neuropsych: This patient is capable of making decisions on his own behalf. 6. Skin/Wound Care: Routine pressure relief measures.  7. Fluids/Electrolytes/Nutrition: Monitor I/Os.                          CMP ordered for tomorrow 8. FTT: Has had issues with N//V/Malaise since last fall due to chemo             --offer supplements between meals             --monitor for orthostatic symptoms.             --recheck Magnesium levels in am.  5/13- Mg level was 1.7- had diarrhea from MgOx even 1 dose- will stop and Boost, because he hates it- family to bring in "[employer] from home"- and that's fine  9. Enterococcus UTI: On amoxacillin for 2 additional days. 10. Anemia of chronic disease:              CBC ordered for tomorrow AM 11. 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 HTN: Monitor BP tid--check orthostatic BP        Vitals:   03/13/21 1947 03/14/21 0311  BP: 131/74 118/66  Pulse: 85 75  Resp: 20 20  Temp: 98.5 F (36.9 C) 97.8 F (36.6 C)  SpO2: 99% 99%  controlled 5/14 13. SOB: Encourage pulmonary hygiene.  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 Question gout flare v/s ingrown toe nail.            Improving 5/14  5/13- will d/c duloxetine since made nauseated; will decrease Celebrex to 100 mg BID and tylenol to 500 mg QID as well as add Tramadol 50 mg QID and  tomorrow if  tolerates new regimen, add keppra 250 mg BID for nerve pain- legs BURNING per pt.   15.  Orthostatic hypotension - suspect tamulosin since this was recently started now off this med, should improve in next 1-2 d- voiding well   LOS: 4 days A FACE TO [place] E Dorothia Passmore 03/14/2021, 7:45 AM

## 2021-03-14 NOTE — Plan of Care (Signed)
  Problem: Consults Goal: RH GENERAL PATIENT EDUCATION Description: See Patient Education module for education specifics. Outcome: Progressing Goal: Nutrition Consult-if indicated Outcome: Progressing   Problem: RH SAFETY Goal: RH STG ADHERE TO SAFETY PRECAUTIONS W/ASSISTANCE/DEVICE Description: STG Adhere to Safety Precautions With Mod I Assistance. Outcome: Progressing Goal: RH STG DECREASED RISK OF FALL WITH ASSISTANCE Description: STG Decreased Risk of Fall With cues and reminders. Outcome: Progressing   Problem: RH PAIN MANAGEMENT Goal: RH STG PAIN MANAGED AT OR BELOW PT'S PAIN GOAL Description: < 3 on a 0-10 pain scale. Outcome: Progressing   Problem: RH KNOWLEDGE DEFICIT GENERAL Goal: RH STG INCREASE KNOWLEDGE OF SELF CARE AFTER HOSPITALIZATION Description: Patient will be able to demonstrate knowledge of medication management, pain management, skin/wound care with educational materials and handouts provided by staff, at discharge independently. Outcome: Progressing

## 2021-03-15 DIAGNOSIS — R5381 Other malaise: Secondary | ICD-10-CM | POA: Diagnosis not present

## 2021-03-15 LAB — CBC
HCT: 36.4 % — ABNORMAL LOW (ref 39.0–52.0)
Hemoglobin: 11.1 g/dL — ABNORMAL LOW (ref 13.0–17.0)
MCH: 31.3 pg (ref 26.0–34.0)
MCHC: 30.5 g/dL (ref 30.0–36.0)
MCV: 102.5 fL — ABNORMAL HIGH (ref 80.0–100.0)
Platelets: 226 10*3/uL (ref 150–400)
RBC: 3.55 MIL/uL — ABNORMAL LOW (ref 4.22–5.81)
RDW: 16.1 % — ABNORMAL HIGH (ref 11.5–15.5)
WBC: 4.2 10*3/uL (ref 4.0–10.5)
nRBC: 0 % (ref 0.0–0.2)

## 2021-03-15 LAB — BASIC METABOLIC PANEL
Anion gap: 3 — ABNORMAL LOW (ref 5–15)
BUN: <5 mg/dL — ABNORMAL LOW (ref 8–23)
CO2: 24 mmol/L (ref 22–32)
Calcium: 7.2 mg/dL — ABNORMAL LOW (ref 8.9–10.3)
Chloride: 110 mmol/L (ref 98–111)
Creatinine, Ser: 0.47 mg/dL — ABNORMAL LOW (ref 0.61–1.24)
GFR, Estimated: >60 mL/min (ref >60)
Glucose, Bld: 92 mg/dL (ref 70–99)
Potassium: 3.5 mmol/L (ref 3.5–5.1)
Sodium: 137 mmol/L (ref 135–145)

## 2021-03-15 MED ORDER — TRAMADOL HCL 50 MG PO TABS
100.0000 mg | ORAL_TABLET | Freq: Three times a day (TID) | ORAL | Status: DC
  Administered 2021-03-15 (×2): 100 mg via ORAL
  Filled 2021-03-15 (×3): qty 2

## 2021-03-15 MED ORDER — LEVETIRACETAM 500 MG PO TABS
500.0000 mg | ORAL_TABLET | Freq: Two times a day (BID) | ORAL | Status: DC
  Administered 2021-03-15 – 2021-03-17 (×4): 500 mg via ORAL
  Filled 2021-03-15 (×4): qty 1

## 2021-03-15 MED ORDER — CALCIUM CARBONATE 1250 (500 CA) MG PO TABS
1.0000 | ORAL_TABLET | Freq: Two times a day (BID) | ORAL | Status: DC
  Administered 2021-03-15 – 2021-03-17 (×4): 500 mg via ORAL
  Filled 2021-03-15 (×4): qty 1

## 2021-03-15 NOTE — Progress Notes (Signed)
Occupational Therapy Session Note  Patient Details  Name: Bruce Smith MRN: [id number] Date of Birth: 1951-02-15  Today's Date: 03/15/2021 OT Individual Time: 1001-1059 OT Individual Time Calculation (min): 58 min    Short Term Goals: Week 1:  OT Short Term Goal 1 (Week 1): STGs = LTGs d/t ELOS at Supervision   Skilled Therapeutic Interventions/Progress Updates:    Pt greeted at time of session supine in bed resting with sister [name] present who remained throughout session. Discussion at beginning of session regarding DC planning, DME needs, etc. Note pt has all DME and practiced walk in shower transfer already this weekend, declined shower as well as pt had already had a shower yesterday. Focus of session on global endurance as pt ambulated room > gym with 1 rest break at nurses station d/t L hip fatigue/discomfort, seated on rollator with good awareness for how to manage breaks/system. Nustep for 7 mins total with brief rest break at half way point intially on level 4 but decreased to level 1 as pt reported L hip discomfort (old break) and wanted to focus more on cardio and ROM. Stand pivot Nustep > wheelchair CGA with rollator and transported outside for fresh air and uplifting mood. Pt/family reported he cannot be near langscaping at this time d/t stem cell transplant and transported to more safe area. Pt sitting outside approx 10 minutes limiting sun exposure and with a hat on. Transported back inside and stand pivot > bed CGA. Call bell in reach all needs met.    Therapy Documentation Precautions:  Precautions Precautions: Fall Precaution Comments: hx falls resulting in scapula, hip and tib fracture within past 4 months Restrictions Weight Bearing Restrictions: No     Therapy/Group: Individual Therapy  Viona Gilmore 03/15/2021, 7:25 AM

## 2021-03-15 NOTE — Progress Notes (Signed)
Physical Therapy Session Note  Patient Details  Name: Bruce Smith MRN: [id number] Date of Birth: 04-07-1951  Today's Date: 03/15/2021 PT Individual Time: 0800-0900 and 1415-1500 PT Individual Time Calculation (min): 60 mins and 45 min   Short Term Goals: Week 1:  PT Short Term Goal 1 (Week 1): STG=LTG 2/2 ELOS  Skilled Therapeutic Interventions/Progress Updates:    pt received in bed and agreeable to therapy. Pt directed in supine>sit CGA, sat EOB SBA and donned shoes min A. Pt then directed in Sit to stand with rollator CGA. Pt directed in gait training with rollator 60' and 100' with rest break in sitting on rollator, VC for safety with transfers on/off rollator break use, and pushing rollator against wall for safety. Pt agreed. Pt then directed in ascending/descending stairs with 2 handrails x7 3" steps and 4 6" steps. Pt then directed in seated BLE strengthening exercises 1.5# on RLE no resistance on LLE x10 marching, LAQ. Pt then directed in x5 Sit to stand CGA. Pt educated on safety at home with energy conservation and decreased fall risk. Pt agreed to all recommendations. Pt directed in car transfer CGA. Pt returned to room in [facility] 50' CGA and rest of distance total A for time. Pt directed in Stand pivot transfer to bedside CGA and sit>supine SBA. Pt left in bed, All needs in reach and in good condition. Call light in hand.  And alarm set. Sister present throughout.   Session 2: pt received in bed and agreeable to therapy. Pt directed in supine>sit SBA with bedrail use; Stand pivot transfer to [facility] with Rolling walker CGA. Pt reported his LLE was very painful this session and requested to defer gait or prolonged standing. Pt directed in WC mobility to elevators SBA for 150'. Pt taken off unit with nursing aware and agreeable, to complete therapy outside. Once outside, pt directed in seated BUE strengthening with orange theraband x10 chest press, bicep curls, tricep curls, rows. Pt then returned to  room total A for time and energy. Pt directed in Stand pivot transfer min A without Rolling walker and SBA for sit>supine. Pt given HEP handout of:  Exercises Seated March - 1 x daily - 7 x weekly - 3 sets - 10 reps Seated Long Arc Quad - 1 x daily - 7 x weekly - 3 sets - 10 reps Seated Ankle Pumps - 1 x daily - 7 x weekly - 3 sets - 10 reps Seated Chest Press - 1 x daily - 7 x weekly - 3 sets - 10 reps Single Arm Bicep Curls with Resistance - 1 x daily - 7 x weekly - 3 sets - 10 reps Seated Elbow Extension with Resistance - 1 x daily - 7 x weekly - 3 sets - 10 reps  with PT educating pt on these with pictures and demonstration. Pt left in bed, All needs in reach and in good condition. Call light in hand.  And sister present thoroughout.   Therapy Documentation Precautions:  Precautions Precautions: Fall Precaution Comments: hx falls resulting in scapula, hip and tib fracture within past 4 months Restrictions Weight Bearing Restrictions: No General:   Vital Signs:   Pain:   Mobility:   Locomotion :    Trunk/Postural Assessment :    Balance:   Exercises:   Other Treatments:      Therapy/Group: Individual Therapy  Bruce Smith 03/15/2021, 3:42 PM

## 2021-03-15 NOTE — Plan of Care (Signed)
  Problem: Consults Goal: RH GENERAL PATIENT EDUCATION Description: See Patient Education module for education specifics. 03/15/2021 1438 by Renda Rolls L, LPN Outcome: Progressing 03/15/2021 1030 by Renda Rolls L, LPN Outcome: Progressing Goal: Nutrition Consult-if indicated 03/15/2021 1438 by Alexie Samson L, LPN Outcome: Progressing 03/15/2021 1030 by Diana Davenport L, LPN Outcome: Progressing   Problem: RH SAFETY Goal: RH STG ADHERE TO SAFETY PRECAUTIONS W/ASSISTANCE/DEVICE Description: STG Adhere to Safety Precautions With Mod I Assistance. 03/15/2021 1438 by Renda Rolls L, LPN Outcome: Progressing 03/15/2021 1030 by Codee Tutson L, LPN Outcome: Progressing Goal: RH STG DECREASED RISK OF FALL WITH ASSISTANCE Description: STG Decreased Risk of Fall With cues and reminders. 03/15/2021 1438 by Renda Rolls L, LPN Outcome: Progressing 03/15/2021 1030 by Musab Wingard L, LPN Outcome: Progressing   Problem: RH PAIN MANAGEMENT Goal: RH STG PAIN MANAGED AT OR BELOW PT'S PAIN GOAL Description: < 3 on a 0-10 pain scale. 03/15/2021 1438 by Renda Rolls L, LPN Outcome: Progressing 03/15/2021 1030 by Renda Rolls L, LPN Outcome: Progressing   Problem: RH KNOWLEDGE DEFICIT GENERAL Goal: RH STG INCREASE KNOWLEDGE OF SELF CARE AFTER HOSPITALIZATION Description: Patient will be able to demonstrate knowledge of medication management, pain management, skin/wound care with educational materials and handouts provided by staff, at discharge independently. 03/15/2021 1438 by Renda Rolls L, LPN Outcome: Progressing 03/15/2021 1030 by Renda Rolls L, LPN Outcome: Progressing

## 2021-03-15 NOTE — Plan of Care (Signed)
  Problem: Consults Goal: RH GENERAL PATIENT EDUCATION Description: See Patient Education module for education specifics. Outcome: Progressing Goal: Nutrition Consult-if indicated Outcome: Progressing   Problem: RH SAFETY Goal: RH STG ADHERE TO SAFETY PRECAUTIONS W/ASSISTANCE/DEVICE Description: STG Adhere to Safety Precautions With Mod I Assistance. Outcome: Progressing Goal: RH STG DECREASED RISK OF FALL WITH ASSISTANCE Description: STG Decreased Risk of Fall With cues and reminders. Outcome: Progressing   Problem: RH PAIN MANAGEMENT Goal: RH STG PAIN MANAGED AT OR BELOW PT'S PAIN GOAL Description: < 3 on a 0-10 pain scale. Outcome: Progressing   Problem: RH KNOWLEDGE DEFICIT GENERAL Goal: RH STG INCREASE KNOWLEDGE OF SELF CARE AFTER HOSPITALIZATION Description: Patient will be able to demonstrate knowledge of medication management, pain management, skin/wound care with educational materials and handouts provided by staff, at discharge independently. Outcome: Progressing   

## 2021-03-15 NOTE — Progress Notes (Signed)
Physical Therapy Session Note  Patient Details  Name: Bruce Smith MRN: [id number] Date of Birth: Jan 07, 1951  Today's Date: 03/15/2021 PT Individual Time: 1135-1200 PT Individual Time Calculation (min): 25 min   Short Term Goals: Week 1:  PT Short Term Goal 1 (Week 1): STG=LTG 2/2 ELOS Week 2:    Week 3:     Skilled Therapeutic Interventions/Progress Updates:    Pain:  Pt reports L femur and calf area pain.  Treatment to tolerance.  Rest breaks and repositioning as needed. Heat applied at end of session.  See below for stretching.  Pt initially supine and agreeable to treatment session.  Supine to sit w/supervision.  stand pivot transfer using rollator to wc w/cga.   Pt propels wc 3ft w/bilat LEs for strengthening actvity. stand pivot transfer wc to mat cga w/rollator.  Standing therex: Overhead 1lb ball raises x 12 w/focus on wt shifting, pt w/post tendency and delayed reactions.  Pt able to self correct w/heavy cueing and pauses between reps, cga to min assist.  Seated russian twist x 10 for core strengthening. Seated HS stretch w/foot on stool 3 x 45 sec folowed by LAQs x 10  stand pivot transfer to wc w/cga, no AD, cues for safety.  Pt provided w/warm packs to LLE, stated good relief w/this. Pt left oob in wc w/alarm belt set and needs in reach      Therapy Documentation Precautions:  Precautions Precautions: Fall Precaution Comments: hx falls resulting in scapula, hip and tib fracture within past 4 months Restrictions Weight Bearing Restrictions: No    Therapy/Group: Individual Therapy  Callie Fielding, [place] 03/15/2021, 12:30 PM

## 2021-03-15 NOTE — Progress Notes (Signed)
PROGRESS NOTE   Subjective/Complaints:  Pt reports tramadol helps leg pain "some", but still has nerve pain.  No side effects from tramadol or Keppra, but not sure Keppra working- explained it usually takes ~ 1 week to help- needs to give it time/will increase to 'regular dose".  Appetite much better.  No constipation.    Off Flomax, voiding well.    ROS:  Pt denies SOB, abd pain, CP, N/V/C/D, and vision changes   Objective:   No results found. Recent Labs    03/15/21 0711  WBC 4.2  HGB 11.1*  HCT 36.4*  PLT 226   Recent Labs    03/15/21 0659  NA 137  K 3.5  CL 110  CO2 24  GLUCOSE 92  BUN <5*  CREATININE 0.47*  CALCIUM 7.2*    Intake/Output Summary (Last 24 hours) at 03/15/2021 0940 Last data filed at 03/15/2021 0900 Gross per 24 hour  Intake 1196 ml  Output --  Net 1196 ml        Physical Exam: Vital Signs Blood pressure 117/71, pulse 81, temperature 97.9 F (36.6 C), resp. rate 18, weight 66.4 kg, SpO2 98 %.   General: awake, alert, appropriate, laying in spine;  NAD HENT: conjugate gaze; oropharynx moist CV: regular rate; no JVD Pulmonary: CTA B/L; no W/R/R- good air movement GI: soft, NT, ND, (+)BS Psychiatric: appropriate, slowed responses;  Neurological: alert Musculoskeletal:     No jt effusions    Cervical back: Normal range of motion and neck supple.     Comments: Bilateral heels boggy. Left great toe red and swollen--mildly tender to touch.   Skin:    General: Skin is warm and dry.  Neurological: .     Comments: Alert Sensation diminished to light touch distal to b/l knees Motor: B/l UE: 5/5 proximal to distal B/l LE: HF, KE 2+/5, ADF 4/5    Assessment/Plan: 1. Functional deficits which require 3+ hours per day of interdisciplinary therapy in a comprehensive inpatient rehab setting.  Physiatrist is providing close team supervision and 24 hour management of active medical  problems listed below.  Physiatrist and rehab team continue to assess barriers to discharge/monitor patient progress toward functional and medical goals  Care Tool:  Bathing    Body parts bathed by patient: Right arm,Left arm,Chest,Abdomen,Front perineal area,Right upper leg,Left upper leg,Face,Buttocks (simulated)   Body parts bathed by helper: Right lower leg,Left lower leg     Bathing assist Assist Level: Minimal Assistance - Patient > 75%     Upper Body Dressing/Undressing Upper body dressing   What is the patient wearing?: Pull over shirt    Upper body assist Assist Level: Supervision/Verbal cueing    Lower Body Dressing/Undressing Lower body dressing      What is the patient wearing?: Pants     Lower body assist Assist for lower body dressing: Minimal Assistance - Patient > 75% (simulated)     Toileting Toileting    Toileting assist Assist for toileting: Supervision/Verbal cueing     Transfers Chair/bed transfer  Transfers assist     Chair/bed transfer assist level: Contact Guard/Touching assist     Locomotion Ambulation   Ambulation assist  Assist level: Contact Guard/Touching assist Assistive device: Rollator Max distance: 75   Walk 10 feet activity   Assist     Assist level: Contact Guard/Touching assist Assistive device: Rollator   Walk 50 feet activity   Assist    Assist level: Contact Guard/Touching assist Assistive device: Rollator    Walk 150 feet activity   Assist Walk 150 feet activity did not occur: Safety/medical concerns         Walk 10 feet on uneven surface  activity   Assist Walk 10 feet on uneven surfaces activity did not occur: Safety/medical concerns         Wheelchair     Assist Will patient use wheelchair at discharge?: Yes Type of Wheelchair: Manual    Wheelchair assist level: Supervision/Verbal cueing Max wheelchair distance: 50    Wheelchair 50 feet with 2 turns  activity    Assist        Assist Level: Supervision/Verbal cueing   Wheelchair 150 feet activity     Assist      Assist Level: Moderate Assistance - Patient 50 - 74%   Blood pressure 117/71, pulse 81, temperature 97.9 F (36.6 C), resp. rate 18, weight 66.4 kg, SpO2 98 %.    Medical Problem List and Plan: 1.  Weakness with fatigue, SOB with activity and poor posture with balance secondary to debility.             -patient may shower             -ELOS/Goals: Supervision/Mod I/8-13 days.             Admit to CIR  con't PT and OT 2.  Antithrombotics: -DVT/anticoagulation:  Pharmaceutical: Lovenox as thrombocytopenia has resolved.              -antiplatelet therapy: N/A 3. Pain Management: Celebrex bid with tylenol prn.  5/16- will increase tramadol and Keppra for nerve pain/leg pain.              Monitor with increased exertion  4. Mood: LCSW to follow for evaluation and support.              -antipsychotic agents: N/A 5. Neuropsych: This patient is capable of making decisions on his own behalf. 6. Skin/Wound Care: Routine pressure relief measures.  7. Fluids/Electrolytes/Nutrition: Monitor I/Os.                          CMP ordered for tomorrow 8. FTT: Has had issues with N//V/Malaise since last fall due to chemo             --offer supplements between meals             --monitor for orthostatic symptoms.             --recheck Magnesium levels in am.  5/13- Mg level was 1.7- had diarrhea from MgOx even 1 dose- will stop and Boost, because he hates it- family to bring in "[employer] from home"- and that's fine  9. Enterococcus UTI: On amoxacillin for 2 additional days. 10. Anemia of chronic disease:              CBC ordered for tomorrow AM 11. 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 HTN: Monitor BP tid--check orthostatic BP        Vitals:   03/14/21 1931 03/15/21 0541  BP: 128/76 117/71  Pulse: 79 81  Resp: 16 18  Temp: 98.7  F (37.1 C) 97.9 F (36.6 C)   SpO2: 99% 98%   5/16- BP well controlled- con't regimen 13. SOB: Encourage pulmonary hygiene.  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 Question gout flare v/s ingrown toe nail.            Improving 5/14  5/13- will d/c duloxetine since made nauseated; will decrease Celebrex to 100 mg BID and tylenol to 500 mg QID as well as add Tramadol 50 mg QID and tomorrow if tolerates new regimen, add keppra 250 mg BID for nerve pain- legs BURNING per pt.   5/16- will increase tramadol to 100 mg TID and increase keppra 500 mg BID and monitor  15.  Orthostatic hypotension - suspect tamulosin since this was recently started now off this med, should improve in next 1-2 d- voiding well  16. Hypocalcemia  5/16- Ca is going down- 7.2. Will replete with supplements. -    LOS: 5 days A FACE TO FACE EVALUATION WAS PERFORMED  Abanoub Hanken 03/15/2021, 9:40 AM

## 2021-03-16 ENCOUNTER — Inpatient Hospital Stay: Payer: BC Managed Care – PPO | Admitting: Hematology & Oncology

## 2021-03-16 ENCOUNTER — Inpatient Hospital Stay: Payer: BC Managed Care – PPO

## 2021-03-16 MED ORDER — TRAMADOL HCL 50 MG PO TABS
50.0000 mg | ORAL_TABLET | Freq: Four times a day (QID) | ORAL | Status: DC
  Administered 2021-03-16 – 2021-03-17 (×4): 50 mg via ORAL
  Filled 2021-03-16 (×3): qty 1

## 2021-03-16 NOTE — Progress Notes (Signed)
Occupational Therapy Session Note  Patient Details  Name: Bruce Smith MRN: [id number] Date of Birth: January 11, 1951  Today's Date: 03/16/2021 OT Individual Time: 6283-1517 (session 2) and 6160-7371 (session 1) OT Individual Time Calculation (min): 28 min and 57 min   Short Term Goals: Week 1:  OT Short Term Goal 1 (Week 1): STGs = LTGs d/t ELOS at Supervision   Skilled Therapeutic Interventions/Progress Updates:    Session 1: Pt greeted at time of session supine in bed resting agreeable to OT session with sister present who remained throughout session. Aware of grad day, politely declined ADL and simulated tasks for bathing/dressing. Pt already completed walk in shower transfer with other OT and feels prepared to do this at home. Supine > sit Supervision and ambulated bed > bathroom > w/c and transferred to/from toilet Supervision. Pt ambulated > gym w/ rollator and focus of session on core/UB strength with rebounder lightweight red weighted ball 1kg for: toss, toss + overhead, toss + torso twist for endurance and strength. Walked gym > ADL apartment and performed item retrieval for items at various heights to simulate cooking/coffee making tasks at home for IADL with Supervision, good recall of technique and safety. Back in room in bed supine resting call bell in reach,  Session 2: Pt greeted at time of session supine resting agreeable to OT session wanting to go outside. Sister present throughout. Stand pivot bed > wheelchair rollator Supervision and self propel partially to outside for UB strength and endurance. Pt transported remaining distance for fatigue and sat outside in shade covered from sun for approx 15 minutes for uplifting mood and therapeutic activity. Self propel partially back in side, therapist again resuming for fatigue. Stand pivot CGA to recliner, call bell in reach and sister present.   Therapy Documentation Precautions:  Precautions Precautions: Fall Precaution Comments: hx  falls resulting in scapula, hip and tib fracture within past 4 months Restrictions Weight Bearing Restrictions: No     Therapy/Group: Individual Therapy  Viona Gilmore 03/16/2021, 7:18 AM

## 2021-03-16 NOTE — Patient Care Conference (Signed)
Inpatient RehabilitationTeam Conference and Plan of Care Update Date: 03/16/2021   Time: 11:30 AM    Patient Name: Bruce Smith      Medical Record Number: [id number]  Date of Birth: 10-27-1951 Sex: Male         Room/Bed: 4M08C/4M08C-01 Payor Info: Payor: [place] / Plan: [employer] COMM PPO / Product Type: *No Product type* /    Admit Date/Time:  03/10/2021 11:25 AM  Primary Diagnosis:  Physical debility  Hospital Problems: Principal Problem:   Physical debility    Expected Discharge Date: Expected Discharge Date: 03/17/21  Team Members Present: Physician leading conference: Dr. Courtney Heys Care Coodinator Present: Erlene Quan, BSW;Carnella Fryman Creig Hines, RN, BSN, [place] Nurse Present: Dorthula Nettles, RN PT Present: Stacy Gardner, PT OT Present: Lillia Corporal, OT PPS Coordinator present : Gunnar Fusi, SLP     Current Status/Progress Goal Weekly Team Focus  Bowel/Bladder   patient is continent to B/B, able to call for help to go to bathroom, LBM 03/15/21  remain continent  Assess Q shift and PRN   Swallow/Nutrition/ Hydration             ADL's   CGA/close supervision overall, showering at shower level per pt (with nursing), CGA/CS walking to/from bathroom and toileting tasks, CS/CGA dressing, BP improved  Supervision  NA grad day   Mobility   be mobility supervision - mod I; transfers CGA-supervision; gait CGA rollator  supervision-CGA  balance, endurance, gait   Communication             Safety/Cognition/ Behavioral Observations            Pain   patient has coronic pain, managing with schedule tramadol and tylenol.  continue manage pain and make sure medication is working effectively.  Assess Q shift and PRN   Skin   patient skin is intact  remain skin intact  Assess Q shift and PRN     Discharge Planning:  pt d/c home with spouse   Team Discussion: Added Keppra but hasn't help nerve pain in legs yet. Tramadol increased, didn't work so decreased back  to original order. Continent B/B, continued pain.  Patient on target to meet rehab goals: yes, supervision with ADL's, close supervision with mobility. Family checked of to transfer to and from bathroom. At goal level with PT and ready for discharge.  *See Care Plan and progress notes for long and short-term goals.   Revisions to Treatment Plan:  Working on pain management.  Teaching Needs: Family education, medication management, pain management, skin/wound care, transfer training, gait training, balance training, endurance training, safety awareness.  Current Barriers to Discharge: Decreased caregiver support, Medical stability, Home enviroment access/layout, Lack of/limited family support, Medication compliance and Behavior  Possible Resolutions to Barriers: Continue current medications, provide emotional support.     Medical Summary Current Status: nerve pain is an issue- mainly LE;s- trying to increase fluid intake; continent B/B; s/p [place] transplant for Multiple myeloma- BP not orthostatic last 24 hours  Barriers to Discharge: Other (comments);Home enviroment access/layout;Medical stability;Weight;Nutrition means  Barriers to Discharge Comments: d/c home with wife; poor stamina- nerve pain still biggest limiters and endurance Possible Resolutions to [employer] Focus: no DME needed- meeting goals; d/c Wednesday 5/18- family training being finished today   Continued Need for Acute Rehabilitation Level of Care: The patient requires daily medical management by a physician with specialized training in physical medicine and rehabilitation for the following reasons: Direction of a multidisciplinary physical rehabilitation program to maximize  functional independence : Yes Medical management of patient stability for increased activity during participation in an intensive rehabilitation regime.: Yes Analysis of laboratory values and/or radiology reports with any subsequent need for  medication adjustment and/or medical intervention. : Yes   I attest that I was present, lead the team conference, and concur with the assessment and plan of the team.   Cristi Loron 03/16/2021, 1:39 PM

## 2021-03-16 NOTE — Progress Notes (Signed)
Physical Therapy Session Note  Patient Details  Name: Bruce Smith MRN: [id number] Date of Birth: 1951-01-12  Today's Date: 03/16/2021 PT Individual Time: 0900-1000 PT Individual Time Calculation (min): 60 min   Short Term Goals: Week 1:  PT Short Term Goal 1 (Week 1): STG=LTG 2/2 ELOS  Skilled Therapeutic Interventions/Progress Updates:    pt received in bed and agreeable to therapy. Pt denied additional questions or concerns about DC home. Pt directed in supine>sit mod I; sat EOB for donning shoes total A for time and requested to urinate prior to leaving room at urinal. Pt unable to void sitting EOB, directed in standing to urinate at urinal with rollator for support at supervision with pt then able to void. Pt then directed in gait training with rollator CGA-supervision with 125' and required seated rest break with good recall of safety with rollator use then directed in gait 68' to ortho gym. Pt then directed in car transfer SBA with good technique, pt educated on safety with energy conservation techniques as well for community mobility. Pt then directed in ascending/descending  Ramp with rollator at supervision. Pt then directed in [facility] mobility 150' supervision. Pt returned to room, requested to rest in bed, supervision to complete this with rollator. Pt requested heat packs on anterior thigh and calf on LLE for improved comfort. Pt then directed in sit>supine mod I. Pt educated on all DC plans, recommendations, supervision at home and use of rollator. Pt agreeable and denied additional concerns. Pt left in bed, All needs in reach and in good condition. Call light in hand.   Therapy Documentation Precautions:  Precautions Precautions: Fall Precaution Comments: hx falls resulting in scapula, hip and tib fracture within past 4 months Restrictions Weight Bearing Restrictions: No General:   Vital Signs:   Pain: Pain Assessment Pain Scale: 0-10 Pain Score: 7  Pain Type: Chronic pain Pain  Location: Leg Pain Orientation: Right;Left Pain Descriptors / Indicators: Aching;Tingling Pain Onset: With Activity Pain Intervention(s): RN made aware;Repositioned;Heat applied Mobility: Bed Mobility Bed Mobility: Rolling Right;Rolling Left;Supine to Sit;Sit to Supine;Scooting to [facility];Sitting - Scoot to [employer] of Bed Rolling Right: Independent with assistive device Rolling Left: Independent with assistive device Supine to Sit: Independent with assistive device Sitting - Scoot to Edge of Bed: Independent with assistive device Sit to Supine: Independent with assistive device Scooting to [facility]: Independent with assistive device Transfers Transfers: Sit to Stand;Stand to Sit;Stand Pivot Transfers Sit to Stand: Supervision/Verbal cueing Stand to Sit: Supervision/Verbal cueing Stand Pivot Transfers: Supervision/Verbal cueing Transfer (Assistive device): Rollator Locomotion : Gait Ambulation: Yes Gait Assistance: Contact Guard/Touching assist Assistive device: Rollator Gait Gait: Yes Gait Pattern: Impaired Gait Pattern: Trunk flexed;Decreased stride length Gait velocity: decreased Stairs / Additional Locomotion Stairs: Yes Stairs Assistance: Contact Guard/Touching assist Stair Management Technique: Two rails Number of Stairs: 4 Height of Stairs: 6 Ramp: Supervision/Verbal cueing Curb: Minimal Assistance - Patient >75% Product manager Mobility: Yes Wheelchair Assistance: Chartered loss adjuster: Both upper extremities Wheelchair Parts Management: Independent Distance: 150  Trunk/Postural Assessment : Cervical Assessment Cervical Assessment: Within Functional Limits Thoracic Assessment Thoracic Assessment: Within Functional Limits Lumbar Assessment Lumbar Assessment: Within Functional Limits  Balance: Balance Balance Assessed: Yes Static Sitting Balance Static Sitting - Balance Support: No upper extremity supported;Feet  supported Static Sitting - Level of Assistance: 6: Modified independent (Device/Increase time) Dynamic Sitting Balance Dynamic Sitting - Balance Support: No upper extremity supported;Feet supported Dynamic Sitting - Level of Assistance: 6: Modified independent (Device/Increase time) Dynamic Sitting - Balance  Activities: Lateral lean/weight shifting;Forward lean/weight shifting;Reaching across midline Static Standing Balance Static Standing - Balance Support: Bilateral upper extremity supported Static Standing - Level of Assistance: 5: Stand by assistance Dynamic Standing Balance Dynamic Standing - Balance Support: Bilateral upper extremity supported;During functional activity Dynamic Standing - Level of Assistance: 5: Stand by assistance Exercises:   Other Treatments:      Therapy/Group: Individual Therapy  Junie Panning 03/16/2021, 11:26 AM

## 2021-03-16 NOTE — Progress Notes (Signed)
Occupational Therapy Session Note  Patient Details  Name: Bruce Smith MRN: [id number] Date of Birth: August 31, 1951  Today's Date: 03/16/2021 OT Individual Time: 1104-1130 OT Individual Time Calculation (min): 26 min    Short Term Goals: Week 1:  OT Short Term Goal 1 (Week 1): STGs = LTGs d/t ELOS at Supervision  Skilled Therapeutic Interventions/Progress Updates:    Pt received in bed and consented to OT tx. Wife present at bedside. Pt requested to complete exercises in room this day, sat EOB with mod I for core strengthening while instructed in BUE strengthening HEP to increase strength and activity tolerance for ADLs. Pt instructed in 1# db exercises including RUE shoulder flexion for 3x15 (LUE not completed 2/2 scap pain). Pt instructed in chest press with 2# dowel rod, pt reports scapula feels fine, instructed in 2x15 chest press and bicep curls for 2x15. Pt required frequent extensive rest breaks due to fatigue. After tx, pt laid back down in bed and left with wife present with all needs met.   Therapy Documentation Precautions:  Precautions Precautions: Fall Precaution Comments: hx falls resulting in scapula, hip and tib fracture within past 4 months Restrictions Weight Bearing Restrictions: No Pain: Pain Assessment Pain Scale: 0-10 Pain Score: 7  Pain Type: Chronic pain Pain Location: Leg Pain Orientation: Right;Left Pain Descriptors / Indicators: Aching;Tingling Pain Onset: With Activity Pain Intervention(s): RN made aware;Repositioned;Heat applied   Therapy/Group: Individual Therapy  Jessilynn Taft 03/16/2021, 12:04 PM

## 2021-03-16 NOTE — Progress Notes (Signed)
Physical Therapy Discharge Summary  Patient Details  Name: Bruce Smith MRN: [id number] Date of Birth: [birth year]  Today's Date: 03/16/2021 PT Individual Time: 0900-1000 PT Individual Time Calculation (min): 60 min    Patient has met 11 of 11 long term goals due to improved activity tolerance, improved balance, improved postural control, increased strength, decreased pain and functional use of  right lower extremity and left lower extremity.  Patient to discharge at an ambulatory level Supervision.   Patient's care partner is independent to provide the necessary physical assistance at discharge.  Reasons goals not met: pt has met all goals  Recommendation:  Patient will benefit from ongoing skilled PT services in home health setting to continue to advance safe functional mobility, address ongoing impairments in tolerance to activity, standing tolerance, overall strength, balance, stair training, and minimize fall risk.  Equipment: No equipment provided  Reasons for discharge: discharge from hospital  Patient/family agrees with progress made and goals achieved: Yes  PT Discharge Precautions/Restrictions Precautions Precautions: Fall Precaution Comments: hx falls resulting in scapula, hip and tib fracture within past 4 months Vital Signs   Pain   Vision/Perception  Perception Perception: Within Functional Limits Praxis Praxis: Intact  Cognition Overall Cognitive Status: Within Functional Limits for tasks assessed Arousal/Alertness: Awake/alert Attention: Focused;Sustained Focused Attention: Appears intact Sustained Attention: Appears intact Awareness: Appears intact Problem Solving: Appears intact Safety/Judgment: Appears intact Sensation Sensation Light Touch: Impaired by gross assessment Coordination Gross Motor Movements are Fluid and Coordinated: No Fine Motor Movements are Fluid and Coordinated: Yes Motor  Motor Motor: Within Functional Limits  Mobility Bed  Mobility Bed Mobility: Rolling Right;Rolling Left;Supine to Sit;Sit to Supine;Scooting to [facility];Sitting - Scoot to [employer] of Bed Rolling Right: Independent with assistive device Rolling Left: Independent with assistive device Supine to Sit: Independent with assistive device Sitting - Scoot to Edge of Bed: Independent with assistive device Sit to Supine: Independent with assistive device Scooting to [facility]: Independent with assistive device Transfers Transfers: Sit to Stand;Stand to Sit;Stand Pivot Transfers Sit to Stand: Supervision/Verbal cueing Stand to Sit: Supervision/Verbal cueing Stand Pivot Transfers: Supervision/Verbal cueing Transfer (Assistive device): Rollator Locomotion  Gait Ambulation: Yes Gait Assistance: Contact Guard/Touching assist Assistive device: Rollator Gait Gait: Yes Gait Pattern: Impaired Gait Pattern: Trunk flexed;Decreased stride length Gait velocity: decreased Stairs / Additional Locomotion Stairs: Yes Stairs Assistance: Contact Guard/Touching assist Stair Management Technique: Two rails Height of Stairs: 6 Ramp: Supervision/Verbal cueing Curb: Minimal Assistance - Patient >75% Product manager Mobility: Yes Wheelchair Assistance: Chartered loss adjuster: Both upper extremities Wheelchair Parts Management: Independent  Trunk/Postural Assessment  Cervical Assessment Cervical Assessment: Within Water engineer Thoracic Assessment: Within Functional Limits Lumbar Assessment Lumbar Assessment: Within Functional Limits  Balance Balance Balance Assessed: Yes Static Sitting Balance Static Sitting - Balance Support: No upper extremity supported;Feet supported Static Sitting - Level of Assistance: 6: Modified independent (Device/Increase time) Dynamic Sitting Balance Dynamic Sitting - Balance Support: No upper extremity supported;Feet supported Dynamic Sitting - Level of Assistance: 6: Modified  independent (Device/Increase time) Dynamic Sitting - Balance Activities: Lateral lean/weight shifting;Forward lean/weight shifting;Reaching across midline Static Standing Balance Static Standing - Balance Support: Bilateral upper extremity supported Static Standing - Level of Assistance: 5: Stand by assistance Dynamic Standing Balance Dynamic Standing - Balance Support: Bilateral upper extremity supported;During functional activity Dynamic Standing - Level of Assistance: 5: Stand by assistance Extremity Assessment      RLE Assessment RLE Assessment: Exceptions to [facility] General Strength Comments: grossly 4/5 limited 2/2 pain LLE  Assessment LLE Assessment: Exceptions to [facility] General Strength Comments: grossly 4/5 limited 2/2 pain    Junie Panning 03/16/2021, 2:12 PM

## 2021-03-16 NOTE — Progress Notes (Signed)
Patient ID: Bruce Smith, male   DOB: Feb 18, 1951, 70 y.o.   MRN: [id number] Team Conference Report to Patient/Family  Team Conference discussion was reviewed with the patient and caregiver, including goals, any changes in plan of care and target discharge date.  Patient and caregiver express understanding and are in agreement.  The patient has a target discharge date of 03/17/21.    SW met with pt and spouse in the room. Provided conference updates. Pt met goals, ready for d/c on tomorrow. Pt requesting to d/c about 9 AM and to have IV in arm removed on today. [place] set. No additional questions or concerns. SW will cont to follow up   Erlene Quan, [place] Dyanne Iha 03/16/2021, 1:27 PM

## 2021-03-16 NOTE — Progress Notes (Signed)
PROGRESS NOTE   Subjective/Complaints:  Pt reports tramadol increase not helpful- no real difference.   LBM yesterday- going well.  BP drops now only slightly with standing- no dizziness.  Still voiding OK.   Last K+ 3.5, but drinking 3 OJ's this AM  ROS:  Pt denies SOB, abd pain, CP, N/V/C/D, and vision changes   Objective:   No results found. Recent Labs    03/15/21 0711  WBC 4.2  HGB 11.1*  HCT 36.4*  PLT 226   Recent Labs    03/15/21 0659  NA 137  K 3.5  CL 110  CO2 24  GLUCOSE 92  BUN <5*  CREATININE 0.47*  CALCIUM 7.2*    Intake/Output Summary (Last 24 hours) at 03/16/2021 0914 Last data filed at 03/16/2021 0813 Gross per 24 hour  Intake 720 ml  Output --  Net 720 ml        Physical Exam: Vital Signs Blood pressure 127/77, pulse 75, temperature 98.5 F (36.9 C), temperature source Oral, resp. rate 16, weight 66.4 kg, SpO2 98 %.    General: awake, alert, appropriate, sitting up EOB; sister at bedside; NAD HENT: conjugate gaze; oropharynx moist CV: regular rate; no JVD Pulmonary: CTA B/L; no W/R/R- good air movement GI: soft, NT, ND, (+)BS Psychiatric: appropriate; more interactive this AM Neurological: alert  Musculoskeletal:     No jt effusions    Cervical back: Normal range of motion and neck supple.     Comments: Bilateral heels boggy. Left great toe red and swollen--mildly tender to touch.   Skin:    General: Skin is warm and dry.  Neurological: .     Comments: Alert Sensation diminished to light touch distal to b/l knees Motor: B/l UE: 5/5 proximal to distal B/l LE: HF, KE 2+/5, ADF 4/5    Assessment/Plan: 1. Functional deficits which require 3+ hours per day of interdisciplinary therapy in a comprehensive inpatient rehab setting.  Physiatrist is providing close team supervision and 24 hour management of active medical problems listed below.  Physiatrist and rehab team  continue to assess barriers to discharge/monitor patient progress toward functional and medical goals  Care Tool:  Bathing    Body parts bathed by patient: Right arm,Left arm,Chest,Abdomen,Front perineal area,Right upper leg,Left upper leg,Face,Buttocks (simulated)   Body parts bathed by helper: Right lower leg,Left lower leg     Bathing assist Assist Level: Minimal Assistance - Patient > 75%     Upper Body Dressing/Undressing Upper body dressing   What is the patient wearing?: Pull over shirt    Upper body assist Assist Level: Supervision/Verbal cueing    Lower Body Dressing/Undressing Lower body dressing      What is the patient wearing?: Pants     Lower body assist Assist for lower body dressing: Minimal Assistance - Patient > 75% (simulated)     Toileting Toileting    Toileting assist Assist for toileting: Supervision/Verbal cueing     Transfers Chair/bed transfer  Transfers assist     Chair/bed transfer assist level: Contact Guard/Touching assist     Locomotion Ambulation   Ambulation assist      Assist level: Contact Guard/Touching assist Assistive  device: Rollator Max distance: 75   Walk 10 feet activity   Assist     Assist level: Contact Guard/Touching assist Assistive device: Rollator   Walk 50 feet activity   Assist    Assist level: Contact Guard/Touching assist Assistive device: Rollator    Walk 150 feet activity   Assist Walk 150 feet activity did not occur: Safety/medical concerns         Walk 10 feet on uneven surface  activity   Assist Walk 10 feet on uneven surfaces activity did not occur: Safety/medical concerns         Wheelchair     Assist Will patient use wheelchair at discharge?: Yes Type of Wheelchair: Manual    Wheelchair assist level: Supervision/Verbal cueing Max wheelchair distance: 50    Wheelchair 50 feet with 2 turns activity    Assist        Assist Level:  Supervision/Verbal cueing   Wheelchair 150 feet activity     Assist      Assist Level: Moderate Assistance - Patient 50 - 74%   Blood pressure 127/77, pulse 75, temperature 98.5 F (36.9 C), temperature source Oral, resp. rate 16, weight 66.4 kg, SpO2 98 %.    Medical Problem List and Plan: 1.  Weakness with fatigue, SOB with activity and poor posture with balance secondary to debility.             -patient may shower             -ELOS/Goals: Supervision/Mod I/8-13 days.             Admit to CIR  con't PT and OT- d/c tomorrow 2.  Antithrombotics: -DVT/anticoagulation:  Pharmaceutical: Lovenox as thrombocytopenia has resolved.              -antiplatelet therapy: N/A 3. Pain Management: Celebrex bid with tylenol prn.  5/16- will increase tramadol and Keppra for nerve pain/leg pain  5/17- tramadol didn't help- will decrease to 50 mg Q6hours and con't increased keppra 500 mg BID.              Monitor with increased exertion  4. Mood: LCSW to follow for evaluation and support.              -antipsychotic agents: N/A 5. Neuropsych: This patient is capable of making decisions on his own behalf. 6. Skin/Wound Care: Routine pressure relief measures.  7. Fluids/Electrolytes/Nutrition: Monitor I/Os.                          CMP ordered for tomorrow 8. FTT: Has had issues with N//V/Malaise since last fall due to chemo             --offer supplements between meals             --monitor for orthostatic symptoms.             --recheck Magnesium levels in am.  5/13- Mg level was 1.7- had diarrhea from MgOx even 1 dose- will stop and Boost, because he hates it- family to bring in "[employer] from home"- and that's fine  5/17- K+ 3.5 but drinking a lot of OJ- and Mg a little low- but got severe diarrhea with 400 mg dose of Mg- so will  monitor -have PCP to monitor 9. Enterococcus UTI: On amoxacillin for 2 additional days. 10. Anemia of chronic disease:  CBC ordered for tomorrow  AM 11. 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 HTN: Monitor BP tid--check orthostatic BP        Vitals:   03/15/21 1939 03/16/21 0544  BP: 133/68 127/77  Pulse: 78 75  Resp: 18 16  Temp: 97.9 F (36.6 C) 98.5 F (36.9 C)  SpO2: 99% 98%   5/17- BP not dropping below 35H systolic- no dizziness- con't to monitor 13. SOB: Encourage pulmonary hygiene.  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 Question gout flare v/s ingrown toe nail.            Improving 5/14  5/13- will d/c duloxetine since made nauseated; will decrease Celebrex to 100 mg BID and tylenol to 500 mg QID as well as add Tramadol 50 mg QID and tomorrow if tolerates new regimen, add keppra 250 mg BID for nerve pain- legs BURNING per pt.   5/16- will increase tramadol to 100 mg TID and increase keppra 500 mg BID and monitor  5/17- no better- will decrease tramadol back to 50 mg q6 hours 15.  Orthostatic hypotension - suspect tamulosin since this was recently started now off this med, should improve in next 1-2 d- voiding well  5/17- doing well- off flomax- voiding well 16. Hypocalcemia  5/16- Ca is going down- 7.2. Will replete with supplements. -    5/17- will con't Ca for 1 week- of note Albumin is 2.1 17. Dispo  5/17- d/c tomorrow LOS: 6 days A FACE TO FACE EVALUATION WAS PERFORMED  Bo Teicher 03/16/2021, 9:14 AM

## 2021-03-16 NOTE — Progress Notes (Signed)
Inpatient Rehabilitation Care Coordinator Discharge Note  The overall goal for the admission was met for:   Discharge location: Yes, home  Length of Stay: Yes, 7 days  Discharge activity level: Yes, ambulatory level Supervision  Home/community participation: Yes  Services provided included: MD, RD, PT, OT, SLP, RN, CM, TR, Pharmacy, Neuropsych and SW  Financial Services: Private Insurance: [employer] offered to/list presented to:pt and spouse  Follow-up services arranged: Home Health: [facility]   Comments (or additional information): PT OT  Patient/Family verbalized understanding of follow-up arrangements: Yes  Individual responsible for coordination of the follow-up plan: Joelene Millin [phone]  Confirmed correct DME delivered: Dyanne Iha 03/16/2021    Dyanne Iha

## 2021-03-16 NOTE — Progress Notes (Signed)
Occupational Therapy Discharge Summary  Patient Details  Name: Bruce Smith MRN: [id number] Date of Birth: 1951/03/05    Patient has met 7 of 7 long term goals due to improved activity tolerance, improved balance, postural control, ability to compensate for deficits and improved coordination.  Patient to discharge at overall Supervision level.  Patient's care partner is independent to provide the necessary physical assistance at discharge.  Pt to DC home with assist from sister Bruce Smith and wife to provide 24/7 Supervision. Sister and/or wife have been present for all sessions for family ed/training.   Reasons goals not met: NA  Recommendation:  Patient will benefit from ongoing skilled OT services in home health setting to continue to advance functional skills in the area of BADL and Reduce care partner burden.  Equipment: No equipment provided  Reasons for discharge: treatment goals met and discharge from hospital  Patient/family agrees with progress made and goals achieved: Yes  OT Discharge Precautions/Restrictions  Precautions Precautions: Fall Precaution Comments: hx falls resulting in scapula, hip and tib fracture within past 4 months Restrictions Weight Bearing Restrictions: No Vital Signs Therapy Vitals Temp: 97.8 F (36.6 C) Pulse Rate: 75 BP: 132/70 Patient Position (if appropriate): Lying Oxygen Therapy SpO2: 99 % O2 Device: Room Air Pain Pain Assessment Pain Scale: 0-10 Pain Score: 7  ADL ADL Eating: Independent Grooming: Setup Upper Body Bathing: Supervision/safety Lower Body Bathing: Supervision/safety Upper Body Dressing: Supervision/safety Lower Body Dressing: Supervision/safety Toileting: Supervision/safety Toilet Transfer: Close supervision Toilet Transfer Method: Counselling psychologist: Geophysical data Smith: Close supervision Vision Baseline Vision/History: Wears glasses Wears Glasses: At all times Patient Visual  Report: No change from baseline Perception  Perception: Within Functional Limits Praxis Praxis: Intact Cognition Overall Cognitive Status: Within Functional Limits for tasks assessed Arousal/Alertness: Awake/alert Orientation Level: Oriented X4 Attention: Focused;Sustained Focused Attention: Appears intact Sustained Attention: Appears intact Awareness: Appears intact Problem Solving: Appears intact Safety/Judgment: Appears intact Sensation Sensation Light Touch: Impaired by gross assessment Coordination Gross Motor Movements are Fluid and Coordinated: No Fine Motor Movements are Fluid and Coordinated: Yes Coordination and Movement Description: mildly uncoordinated d/t decreased strength and endurance Motor  Motor Motor: Within Functional Limits Mobility  Bed Mobility Bed Mobility: Rolling Right;Rolling Left;Supine to Sit;Sit to Supine;Scooting to [facility];Sitting - Scoot to [employer] of Bed Rolling Right: Independent with assistive device Rolling Left: Independent with assistive device Supine to Sit: Independent with assistive device Sitting - Scoot to Edge of Bed: Independent with assistive device Sit to Supine: Independent with assistive device Scooting to [facility]: Independent with assistive device Transfers Sit to Stand: Supervision/Verbal cueing Stand to Sit: Supervision/Verbal cueing  Trunk/Postural Assessment  Cervical Assessment Cervical Assessment: Within Functional Limits Thoracic Assessment Thoracic Assessment: Within Functional Limits Lumbar Assessment Lumbar Assessment: Within Functional Limits Postural Control Postural Control: Within Functional Limits  Balance Balance Balance Assessed: Yes Static Sitting Balance Static Sitting - Balance Support: No upper extremity supported;Feet supported Static Sitting - Level of Assistance: 6: Modified independent (Device/Increase time) Dynamic Sitting Balance Dynamic Sitting - Balance Support: No upper extremity supported;Feet  supported Dynamic Sitting - Level of Assistance: 6: Modified independent (Device/Increase time) Dynamic Sitting - Balance Activities: Lateral lean/weight shifting;Forward lean/weight shifting;Reaching across midline Static Standing Balance Static Standing - Balance Support: Bilateral upper extremity supported Static Standing - Level of Assistance: 5: Stand by assistance Dynamic Standing Balance Dynamic Standing - Balance Support: Bilateral upper extremity supported;During functional activity Dynamic Standing - Level of Assistance: 5: Stand by assistance Dynamic Standing - Balance Activities:  Lateral lean/weight shifting;Forward lean/weight shifting Extremity/Trunk Assessment RUE Assessment RUE Assessment: Within Functional Limits LUE Assessment LUE Assessment: Exceptions to [facility] General Strength Comments: ROM WFL (limited by old scap fx but still functional)   Viona Gilmore 03/16/2021, 4:58 PM

## 2021-03-17 MED ORDER — LOPERAMIDE HCL 2 MG PO CAPS: 2.0000 mg | ORAL_CAPSULE | ORAL | 0 refills | Status: DC | PRN

## 2021-03-17 MED ORDER — TRAMADOL HCL 50 MG PO TABS: 50.0000 mg | ORAL_TABLET | Freq: Four times a day (QID) | ORAL | 0 refills | Status: DC

## 2021-03-17 MED ORDER — SACCHAROMYCES BOULARDII 250 MG PO CAPS: 250.0000 mg | ORAL_CAPSULE | Freq: Two times a day (BID) | ORAL | 0 refills | Status: DC

## 2021-03-17 MED ORDER — ACETAMINOPHEN 500 MG PO TABS: 500.0000 mg | ORAL_TABLET | Freq: Four times a day (QID) | ORAL | 0 refills | Status: DC

## 2021-03-17 MED ORDER — PREGABALIN 100 MG PO CAPS: 100.0000 mg | ORAL_CAPSULE | Freq: Every day | ORAL | 0 refills | Status: DC

## 2021-03-17 MED ORDER — CELECOXIB 100 MG PO CAPS: 100.0000 mg | ORAL_CAPSULE | Freq: Two times a day (BID) | ORAL | 0 refills | Status: DC

## 2021-03-17 MED ORDER — CALCIUM POLYCARBOPHIL 625 MG PO TABS: 625.0000 mg | ORAL_TABLET | Freq: Every day | ORAL | 0 refills | Status: DC

## 2021-03-17 MED ORDER — LEVETIRACETAM 500 MG PO TABS: 500.0000 mg | ORAL_TABLET | Freq: Two times a day (BID) | ORAL | 0 refills | Status: DC

## 2021-03-17 MED ORDER — PREGABALIN 150 MG PO CAPS: 150.0000 mg | ORAL_CAPSULE | Freq: Every day | ORAL | 0 refills | Status: DC

## 2021-03-17 MED ORDER — CALCIUM CARBONATE 1250 (500 CA) MG PO TABS: 1.0000 | ORAL_TABLET | Freq: Two times a day (BID) | ORAL | 0 refills | Status: DC

## 2021-03-17 NOTE — Progress Notes (Signed)
PROGRESS NOTE   Subjective/Complaints:  Pt reports he's doing well-  We discussed his K+ is borderline low- 3.5- and Mg is also on low side- will need to take some Mg supplements- maybe 100 mg 2-3x/day and also keep up with OJ and bananas.    ROS:   Pt denies SOB, abd pain, CP, N/V/C/D, and vision changes  Objective:   No results found. Recent Labs    03/15/21 0711  WBC 4.2  HGB 11.1*  HCT 36.4*  PLT 226   Recent Labs    03/15/21 0659  NA 137  K 3.5  CL 110  CO2 24  GLUCOSE 92  BUN <5*  CREATININE 0.47*  CALCIUM 7.2*    Intake/Output Summary (Last 24 hours) at 03/17/2021 0832 Last data filed at 03/17/2021 0710 Gross per 24 hour  Intake 1140 ml  Output --  Net 1140 ml        Physical Exam: Vital Signs Blood pressure 136/78, pulse 78, temperature 99.2 F (37.3 C), temperature source Oral, resp. rate 17, weight 66.4 kg, SpO2 97 %.     General: awake, alert, appropriate, sitting up in bed; sister at bedside; NAD HENT: conjugate gaze; oropharynx moist CV: regular rate; no JVD Pulmonary: CTA B/L; no W/R/R- good air movement GI: soft, NT, ND, (+)BS Psychiatric: appropriate- brighter- excited about d/c.  Neurological: Ox3 Musculoskeletal:     No jt effusions    Cervical back: Normal range of motion and neck supple.     Comments: Bilateral heels boggy. Left great toe red and swollen--mildly tender to touch.   Skin:    General: Skin is warm and dry.  Neurological: .     Comments: Alert Sensation diminished to light touch distal to b/l knees Motor: B/l UE: 5/5 proximal to distal B/l LE: HF, KE 2+/5, ADF 4/5    Assessment/Plan: 1. Functional deficits which require 3+ hours per day of interdisciplinary therapy in a comprehensive inpatient rehab setting.  Physiatrist is providing close team supervision and 24 hour management of active medical problems listed below.  Physiatrist and rehab team  continue to assess barriers to discharge/monitor patient progress toward functional and medical goals  Care Tool:  Bathing    Body parts bathed by patient: Right arm,Left arm,Chest,Abdomen,Front perineal area,Right upper leg,Left upper leg,Face,Buttocks,Right lower leg,Left lower leg   Body parts bathed by helper: Right lower leg,Left lower leg     Bathing assist Assist Level: Supervision/Verbal cueing (simulated)     Upper Body Dressing/Undressing Upper body dressing   What is the patient wearing?: Pull over shirt    Upper body assist Assist Level: Supervision/Verbal cueing (simulated)    Lower Body Dressing/Undressing Lower body dressing      What is the patient wearing?: Pants     Lower body assist Assist for lower body dressing: Supervision/Verbal cueing (simulated)     Toileting Toileting    Toileting assist Assist for toileting: Supervision/Verbal cueing     Transfers Chair/bed transfer  Transfers assist     Chair/bed transfer assist level: Supervision/Verbal cueing     Locomotion Ambulation   Ambulation assist      Assist level: Contact Guard/Touching assist Assistive  device: Rollator Max distance: 100   Walk 10 feet activity   Assist     Assist level: Contact Guard/Touching assist Assistive device: Rollator   Walk 50 feet activity   Assist    Assist level: Contact Guard/Touching assist Assistive device: Rollator    Walk 150 feet activity   Assist Walk 150 feet activity did not occur: Safety/medical concerns  Assist level: Contact Guard/Touching assist      Walk 10 feet on uneven surface  activity   Assist Walk 10 feet on uneven surfaces activity did not occur: Safety/medical concerns         Wheelchair     Assist Will patient use wheelchair at discharge?: Yes Type of Wheelchair: Manual    Wheelchair assist level: Supervision/Verbal cueing Max wheelchair distance: 150    Wheelchair 50 feet with 2 turns  activity    Assist        Assist Level: Supervision/Verbal cueing   Wheelchair 150 feet activity     Assist      Assist Level: Supervision/Verbal cueing   Blood pressure 136/78, pulse 78, temperature 99.2 F (37.3 C), temperature source Oral, resp. rate 17, weight 66.4 kg, SpO2 97 %.    Medical Problem List and Plan: 1.  Weakness with fatigue, SOB with activity and poor posture with balance secondary to debility.             -patient may shower             -ELOS/Goals: Supervision/Mod I/8-13 days.             Admit to CIR  D/c today- will have him f/u with me as needed 2.  Antithrombotics: -DVT/anticoagulation:  Pharmaceutical: Lovenox as thrombocytopenia has resolved.              -antiplatelet therapy: N/A 3. Pain Management: Celebrex bid with tylenol prn.  5/16- will increase tramadol and Keppra for nerve pain/leg pain  5/17- tramadol didn't help- will decrease to 50 mg Q6hours and con't increased keppra 500 mg BID.  5/18- send home on this regimen- if Keppra doesn't kick in in the next few weeks, would wean off it-               Monitor with increased exertion  4. Mood: LCSW to follow for evaluation and support.              -antipsychotic agents: N/A 5. Neuropsych: This patient is capable of making decisions on his own behalf. 6. Skin/Wound Care: Routine pressure relief measures.  7. Fluids/Electrolytes/Nutrition: Monitor I/Os.                          CMP ordered for tomorrow 8. FTT: Has had issues with N//V/Malaise since last fall due to chemo             --offer supplements between meals             --monitor for orthostatic symptoms.             --recheck Magnesium levels in am.  5/13- Mg level was 1.7- had diarrhea from MgOx even 1 dose- will stop and Boost, because he hates it- family to bring in "[employer] from home"- and that's fine  5/17- K+ 3.5 but drinking a lot of OJ- and Mg a little low- but got severe diarrhea with 400 mg dose of Mg- so will   monitor -have PCP to monitor  5/18-  went over with pt/sister to take lower dose Mg supplements and also maintain drinking OJ and bananas- need to have PCP check labs in next 1-2 weeks 9. Enterococcus UTI: On amoxacillin for 2 additional days. 10. Anemia of chronic disease:              CBC ordered for tomorrow AM 11. 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 HTN: Monitor BP tid--check orthostatic BP        Vitals:   03/16/21 1930 03/17/21 0610  BP: (!) 141/79 136/78  Pulse: 79 78  Resp: 18 17  Temp: 97.8 F (36.6 C) 99.2 F (37.3 C)  SpO2: 100% 97%   5/17- BP not dropping below 54Y systolic- no dizziness- con't to monitor  5/18- BP overall controlled- con't regimen 13. SOB: Encourage pulmonary hygiene.  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 Question gout flare v/s ingrown toe nail.            Improving 5/14  5/13- will d/c duloxetine since made nauseated; will decrease Celebrex to 100 mg BID and tylenol to 500 mg QID as well as add Tramadol 50 mg QID and tomorrow if tolerates new regimen, add keppra 250 mg BID for nerve pain- legs BURNING per pt.   5/16- will increase tramadol to 100 mg TID and increase keppra 500 mg BID and monitor  5/17- no better- will decrease tramadol back to 50 mg q6 hours 15.  Orthostatic hypotension - suspect tamulosin since this was recently started now off this med, should improve in next 1-2 d- voiding well  5/17- doing well- off flomax- voiding well 16. Hypocalcemia  5/16- Ca is going down- 7.2. Will replete with supplements. -    5/17- will con't Ca for 1 week- of note Albumin is 2.1 17. Dispo  5/17- d/c tomorrow  5/18- d/c today- f/u with me as needed LOS: 7 days A FACE TO [place] 03/17/2021, 8:32 AM

## 2021-03-17 NOTE — Discharge Instructions (Signed)
Inpatient Rehab Discharge Instructions  Bruce Smith Discharge date and time: 03/17/21   Activities/Precautions/ Functional Status: Activity: no lifting, driving, or strenuous exercise till cleared by MD Diet: regular diet Wound Care: none needed    Functional status:  ___ No restrictions     ___ Walk up steps independently _X__ 24/7 supervision/assistance   ___ Walk up steps with assistance ___ Intermittent supervision/assistance  ___ Bathe/dress independently ___ Walk with walker     _X__ Bathe/dress with assistance ___ Walk Independently    ___ Shower independently ___ Walk with assistance    ___ Shower with assistance _X__ No alcohol     ___ Return to work/school ________   Special Instructions: 1. Need to increase intake of potassium rich foods--dairy, tomatoes, citrus fruits, greens 2. Purchase over the counter magnesium tabs 100 mg and use 2-3 times a day as tolerated.  3. DO NOT TAKE ANY MEDICATIONS THAT ARE NOT ON THE LIST--FOLLOW UP WITH DR. [name] FOR INPUT ON PAIN MEDICATION CHANGES.     COMMUNITY REFERRALS UPON DISCHARGE:    Home Health:   PT     OT     ST    RN    SNA                   Agency: [facility] Phone: [phone]      My questions have been answered and I understand these instructions. I will adhere to these goals and the provided educational materials after my discharge from the hospital.  Patient/Caregiver Signature _______________________________ Date __________  Clinician Signature _______________________________________ Date __________  Please bring this form and your medication list with you to all your follow-up doctor's appointments.

## 2021-03-17 NOTE — Discharge Summary (Signed)
Physician Discharge Summary  Patient ID: Bruce Smith MRN: [id number] DOB/AGE: 1951/09/22 70 y.o.  Admit date: 03/10/2021 Discharge date: 03/17/2021  Discharge Diagnoses:  Principal Problem:   Physical debility Active Problems:   Kappa light chain myeloma (HCC)   Malnutrition of moderate degree   Pain in both feet   Hypomagnesemia   Hypocalcemia   Discharged Condition: stable  Significant Diagnostic Studies: N/A   Labs:  Basic Metabolic Panel: BMP Latest Ref Rng & Units 03/15/2021 03/11/2021 03/09/2021  Glucose 70 - 99 mg/dL 92 96 89  BUN 8 - 23 mg/dL <5(L) 6(L) 10  Creatinine 0.61 - 1.24 mg/dL 0.47(L) 0.52(L) 0.34(L)  Sodium 135 - 145 mmol/L 137 137 137  Potassium 3.5 - 5.1 mmol/L 3.5 3.9 4.0  Chloride 98 - 111 mmol/L 110 111 112(H)  CO2 22 - 32 mmol/L 24 22 22   Calcium 8.9 - 10.3 mg/dL 7.2(L) 7.4(L) 7.7(L)    CBC: CBC Latest Ref Rng & Units 03/15/2021 03/11/2021 03/10/2021  WBC 4.0 - 10.5 K/uL 4.2 5.1 5.1  Hemoglobin 13.0 - 17.0 g/dL 11.1(L) 10.2(L) 9.8(L)  Hematocrit 39.0 - 52.0 % 36.4(L) 29.9(L) 30.1(L)  Platelets 150 - 400 K/uL 226 189 182    CBG: No results for input(s): GLUCAP in the last 168 hours.  Brief HPI:   Bruce Smith is a 70 y.o. male with history of HTN, esophageal stricture, MM with extensive disease and pathological fractures left scapula and left femur who underwent ASCT at [facility] on 01/22/2021.  He was discharged to home but was noted to have poor intake with weakness, falls and inability to keep food down.  He was started on IV fluids for hydration and underwent EGD that was normal except for question of lymph angiectasia.    He was found to have Enterococcus UTI and treated with Unasyn.  He continued to have poor p.o. intake requiring TPN we briefly.  He also had issues with confusion felt to be due to multifactorial.  Narcotics were discontinued, he was started on and confusion had resolved.  Therapy was initiated and he was found to be limited by  weakness with fatigue, SOB with activity as well as poor posture with balance deficits.  CIR was recommended due to functional decline.   Hospital Course: Bruce Smith was admitted to rehab 03/10/2021 for inpatient therapies to consist of PT, ST and OT at least three hours five days a week. Past admission physiatrist, therapy team and rehab RN have worked together to provide customized collaborative inpatient rehab.  Follow-up labs showed anemia of chronic disease and that thrombocytopenia had resolved therefore Lovenox was added for DVT prophylaxis.  He was encouraged on utilization of pulmonary hygiene to help with shortness of breath.  He completed 2 additional days of amoxicillin for treatment of his Enterococcus UTI.    Check of electrolytes showed evidence of malnutrition with magnesium at 1.6 and albumin at 2.1.  His calcium level was trended down to 2.7 therefore supplement added.  He did develop diarrhea due to this therefore this was discontinued.  Nutritional supplements were offered however patient declined this and family was advised to bring his "Core Power" supplements from home.  He did report some issues with hesitancy therefore Flomax was initiated briefly.  However he developed orthostatic symptoms due to just so this was discontinued after 48 hours.  His blood pressures were stable at discharge and he reported no problems with voiding.  Palliative care has followed for input and he was started on Tylenol  1000 mg 3 times daily for pain management.  Low-dose tramadol was also added as Tylenol was ineffective.  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  He was transitioned to [place] which has been slowly titrated to 500 mg twice daily.  He has made steady gains during his rehab stay and currently requires supervision for safety.  He will continue to receive  follow-up home health PT and OT by [facility]  home health after discharge.   Rehab course: During patient's stay in rehab weekly team conferences were held to monitor patient's progress, set goals and discuss barriers to discharge. At admission, patient required  He  has had improvement in activity tolerance, balance, postural control as well as ability to compensate for deficits.  He is able to complete ADL tasks with supervision.  He requires supervision for transfers and contact-guard assist with a Rollator to ambulate short distances.  His sister has been very supportive and has been here for most therapy sessions and during the day.  Family continue provide supervision after discharge.  Disposition: Home  Diet: Regular  Special Instructions: 1.  No driving or strenuous activity till cleared by MD.  Allergies as of 03/17/2021   No Known Allergies     Medication List    STOP taking these medications   amoxicillin 500 MG capsule Commonly known as: AMOXIL   tamsulosin 0.4 MG Caps capsule Commonly known as: FLOMAX     TAKE these medications   acetaminophen 500 MG tablet Commonly known as: TYLENOL Take 1 tablet (500 mg total) by mouth every 6 (six) hours.   acyclovir 400 MG tablet Commonly known as: ZOVIRAX Take 400 mg by mouth 2 (two) times daily.   calcium carbonate 1250 (500 Ca) MG tablet Commonly known as: OS-CAL - dosed in mg of elemental calcium Take 1 tablet (500 mg of elemental calcium total) by mouth 2 (two) times daily with a meal.   celecoxib 100 MG capsule Commonly known as: CELEBREX Take 1 capsule (100 mg total) by mouth 2 (two) times daily. What changed:   medication strength  how much to take Notes to patient: Wean to off as pain improves--may cause stomach distress and swelling of legs   levETIRAcetam 500 MG tablet Commonly known as: KEPPRA Take 1 tablet (500 mg total) by mouth 2 (two) times daily. Notes to patient: To help with neuropathy. Can  discontinue if no improvement seen in 2-3 weeks.    loperamide 2 MG capsule Commonly known as: IMODIUM Take 1 capsule (2 mg total) by mouth as needed for diarrhea or loose stools.   ondansetron 8 MG tablet Commonly known as: ZOFRAN TAKE 1 TABLET BY MOUTH EVERY 8 HOURS AS NEEDED NAUSEA AND VOMITING What changed: See the new instructions.   pantoprazole 40 MG tablet Commonly known as: PROTONIX Take 1 tablet (40 mg total) by mouth 2 (two) times daily before a meal.   polycarbophil 625 MG tablet Commonly known as: FIBERCON Take 1 tablet (625 mg total) by mouth daily. Notes to patient: To help bulk loose stools   pregabalin 150 MG capsule Commonly known as: LYRICA Take 1 capsule (150 mg total) by mouth at bedtime.   pregabalin 100 MG capsule Commonly known as: LYRICA Take 1 capsule (100 mg total) by mouth daily.   saccharomyces boulardii 250 MG capsule Commonly known as: FLORASTOR Take 1 capsule (250 mg total) by  mouth 2 (two) times daily. Notes to patient: PROBIOTIC FOR BOWELS   traMADol 50 MG tablet--Rx# 120 pills Commonly known as: ULTRAM Take 1 tablet (50 mg total) by mouth every 6 (six) hours. Notes to patient: To help with pain control--narcotics were discontinued at [facility] due to confusion/delirium.        Follow-up Information    Lovorn, Jinny Blossom, MD. Call.   Specialty: Physical Medicine and Rehabilitation Why: as needed Contact information: [address] [place] [address] [phone]        Volanda Napoleon, MD. Call.   Specialty: Oncology Why: for follow up appointment Contact information: [address] [place]  [address] [phone]        Derrill Center., MD. Call.   Specialty: Family Medicine Why: for post hospital follow up in 5-7 days. Needs Potassium and Magnesium levels rechecked.  Contact information: [address] [place] [address] [phone]               Signed: Bary Leriche 03/23/2021, 9:57 AM

## 2021-03-18 ENCOUNTER — Telehealth: Payer: Self-pay | Admitting: *Deleted

## 2021-03-18 NOTE — Telephone Encounter (Signed)
Patients wife left a message asking for Dr. Dagoberto Ligas would please call her to discuss patient taking magnesium.  Patient appears to be inpatient or recently discharged

## 2021-03-19 NOTE — Telephone Encounter (Signed)
Just spoke to [place] really disagreed with pt- made him have diarrhea x5 30 minutes after receiving Mg 100 mg- suggested dietary ways to get Mg.

## 2021-03-22 ENCOUNTER — Telehealth: Payer: Self-pay

## 2021-03-22 NOTE — Telephone Encounter (Signed)
Attempted to contact patient's wife Maudie Mercury to reschedule consult appointment. No answer left a message to return call.

## 2021-03-22 NOTE — Telephone Encounter (Signed)
Patient's wife Maudie Mercury has declined services at this time. She will notify PCP if they decide to proceed with services in the future. Referral canceled. PCP notified.

## 2021-03-24 ENCOUNTER — Telehealth: Payer: Self-pay

## 2021-03-24 ENCOUNTER — Inpatient Hospital Stay: Payer: BC Managed Care – PPO

## 2021-03-24 ENCOUNTER — Inpatient Hospital Stay: Payer: BC Managed Care – PPO | Admitting: Hematology & Oncology

## 2021-03-24 NOTE — Telephone Encounter (Signed)
Pts wife called and he is not up to coming in for his appt thei morning.  Pt was offered this afternoon but declined and will come in on 5/31    Apostolos Blagg

## 2021-03-30 ENCOUNTER — Telehealth: Payer: Self-pay

## 2021-03-30 ENCOUNTER — Other Ambulatory Visit: Payer: Self-pay

## 2021-03-30 ENCOUNTER — Encounter: Payer: Self-pay | Admitting: *Deleted

## 2021-03-30 ENCOUNTER — Encounter: Payer: Self-pay | Admitting: Hematology & Oncology

## 2021-03-30 ENCOUNTER — Inpatient Hospital Stay (HOSPITAL_BASED_OUTPATIENT_CLINIC_OR_DEPARTMENT_OTHER): Payer: BC Managed Care – PPO | Admitting: Hematology & Oncology

## 2021-03-30 ENCOUNTER — Inpatient Hospital Stay: Payer: BC Managed Care – PPO | Attending: Hematology & Oncology

## 2021-03-30 VITALS — BP 91/60 | HR 91 | Temp 97.9°F | Resp 18 | Wt 145.0 lb

## 2021-03-30 DIAGNOSIS — Z87311 Personal history of (healed) other pathological fracture: Secondary | ICD-10-CM | POA: Insufficient documentation

## 2021-03-30 DIAGNOSIS — C9000 Multiple myeloma not having achieved remission: Secondary | ICD-10-CM | POA: Insufficient documentation

## 2021-03-30 DIAGNOSIS — M79606 Pain in leg, unspecified: Secondary | ICD-10-CM | POA: Insufficient documentation

## 2021-03-30 DIAGNOSIS — C419 Malignant neoplasm of bone and articular cartilage, unspecified: Secondary | ICD-10-CM

## 2021-03-30 DIAGNOSIS — R531 Weakness: Secondary | ICD-10-CM | POA: Diagnosis not present

## 2021-03-30 DIAGNOSIS — Z79899 Other long term (current) drug therapy: Secondary | ICD-10-CM | POA: Insufficient documentation

## 2021-03-30 LAB — CMP (CANCER CENTER ONLY)
ALT: 8 U/L (ref 0–44)
AST: 11 U/L — ABNORMAL LOW (ref 15–41)
Albumin: 2.9 g/dL — ABNORMAL LOW (ref 3.5–5.0)
Alkaline Phosphatase: 93 U/L (ref 38–126)
Anion gap: 6 (ref 5–15)
BUN: 9 mg/dL (ref 8–23)
CO2: 27 mmol/L (ref 22–32)
Calcium: 8.2 mg/dL — ABNORMAL LOW (ref 8.9–10.3)
Chloride: 104 mmol/L (ref 98–111)
Creatinine: 0.56 mg/dL — ABNORMAL LOW (ref 0.61–1.24)
GFR, Estimated: >60 mL/min (ref >60)
Glucose, Bld: 116 mg/dL — ABNORMAL HIGH (ref 70–99)
Potassium: 4.2 mmol/L (ref 3.5–5.1)
Sodium: 137 mmol/L (ref 135–145)
Total Bilirubin: 0.3 mg/dL (ref 0.3–1.2)
Total Protein: 4.3 g/dL — ABNORMAL LOW (ref 6.5–8.1)

## 2021-03-30 LAB — CBC WITH DIFFERENTIAL (CANCER CENTER ONLY)
Abs Immature Granulocytes: 0.05 10*3/uL (ref 0.00–0.07)
Basophils Absolute: 0 10*3/uL (ref 0.0–0.1)
Basophils Relative: 0 %
Eosinophils Absolute: 0.1 10*3/uL (ref 0.0–0.5)
Eosinophils Relative: 2 %
HCT: 35.1 % — ABNORMAL LOW (ref 39.0–52.0)
Hemoglobin: 11.6 g/dL — ABNORMAL LOW (ref 13.0–17.0)
Immature Granulocytes: 1 %
Lymphocytes Relative: 24 %
Lymphs Abs: 1.4 10*3/uL (ref 0.7–4.0)
MCH: 31.7 pg (ref 26.0–34.0)
MCHC: 33 g/dL (ref 30.0–36.0)
MCV: 95.9 fL (ref 80.0–100.0)
Monocytes Absolute: 0.7 10*3/uL (ref 0.1–1.0)
Monocytes Relative: 11 %
Neutro Abs: 3.7 10*3/uL (ref 1.7–7.7)
Neutrophils Relative %: 62 %
Platelet Count: 228 10*3/uL (ref 150–400)
RBC: 3.66 MIL/uL — ABNORMAL LOW (ref 4.22–5.81)
RDW: 15.2 % (ref 11.5–15.5)
WBC Count: 6 10*3/uL (ref 4.0–10.5)
nRBC: 0 % (ref 0.0–0.2)

## 2021-03-30 LAB — MAGNESIUM: Magnesium: 1.3 mg/dL — ABNORMAL LOW (ref 1.7–2.4)

## 2021-03-30 LAB — PREALBUMIN: Prealbumin: 15.5 mg/dL — ABNORMAL LOW (ref 18–38)

## 2021-03-30 LAB — LACTATE DEHYDROGENASE: LDH: 130 U/L (ref 98–192)

## 2021-03-30 NOTE — Progress Notes (Signed)
Hematology and Oncology Follow Up Visit  Bruce Smith [id number] [birth year] 70 y.o. 03/30/2021   Principle Diagnosis:  Kappa light chain myeloma-extensive bone disease-pathologic fracture of left scapula and left femur - t(11:14), 13q-, 1p-,1q-  Past Therapy: Status post surgical repair of left femur-07/14/2020 XRT to left shoulder and left hip Xgeva 120 mg subcu every 3 months-next dose in February 2022  Faspro/Velcade/Revlimid/Decadron -- s/p cycle #2 -- start on       08/06/2020  ( Revlimid is 14 on /14 off)   Current Therapy:        ASCT -- [facility] on -01/22/2021   Interim History:  Mr. Spinney is here today with his wife for follow-up.  He is looking much better.  Last time we saw him, we had to admit him.  He was just not doing well.  He was incredibly weak.  He was in a lot of pain.  He just was having a very difficult time.  He was hospitalized for couple weeks.  We finally were able to get him to where he was able to go home.  He felt a whole lot better.  I am just glad to see that he is improving.  His albumin keeps going up which is nice to see.  His liver function studies are okay.  He still has a leg pain.  He takes Tylenol for this.  I do not see any problem with him taking Tylenol.  His appetite is improving.  He has had no nausea or vomiting.  There is been no rashes.  He has had no fever.  He has had no cough or shortness of breath.  He has had no mouth sores.  He is not sure when he has to go back to [place].  We had to cancel his last appointment because he was in the hospital.  Currently, I would say his performance status is probably ECOG 2.    Medications:  Allergies as of 03/30/2021   No Known Allergies     Medication List       Accurate as of Mar 30, 2021  2:49 PM. If you have any questions, ask your nurse or doctor.        acetaminophen 500 MG tablet Commonly known as: TYLENOL Take 1 tablet (500 mg total) by mouth every 6 (six) hours.   acyclovir 400  MG tablet Commonly known as: ZOVIRAX Take 400 mg by mouth 2 (two) times daily.   atorvastatin 40 MG tablet Commonly known as: LIPITOR Take 0.5 tablets by mouth daily.   calcium carbonate 1250 (500 Ca) MG tablet Commonly known as: OS-CAL - dosed in mg of elemental calcium Take 1 tablet (500 mg of elemental calcium total) by mouth 2 (two) times daily with a meal.   celecoxib 100 MG capsule Commonly known as: CELEBREX Take 1 capsule (100 mg total) by mouth 2 (two) times daily.   levETIRAcetam 500 MG tablet Commonly known as: KEPPRA Take 1 tablet (500 mg total) by mouth 2 (two) times daily.   lisinopril 5 MG tablet Commonly known as: ZESTRIL Take 7.5 mg by mouth daily.   loperamide 2 MG capsule Commonly known as: IMODIUM Take 1 capsule (2 mg total) by mouth as needed for diarrhea or loose stools.   omeprazole 40 MG capsule Commonly known as: PRILOSEC Take 1 capsule by mouth daily.   ondansetron 8 MG tablet Commonly known as: ZOFRAN TAKE 1 TABLET BY MOUTH EVERY 8 HOURS AS NEEDED NAUSEA AND VOMITING What  changed: See the new instructions.   pantoprazole 40 MG tablet Commonly known as: PROTONIX Take 1 tablet (40 mg total) by mouth 2 (two) times daily before a meal.   polycarbophil 625 MG tablet Commonly known as: FIBERCON Take 1 tablet (625 mg total) by mouth daily.   pregabalin 150 MG capsule Commonly known as: LYRICA Take 1 capsule (150 mg total) by mouth at bedtime.   pregabalin 100 MG capsule Commonly known as: LYRICA Take 1 capsule (100 mg total) by mouth daily.   saccharomyces boulardii 250 MG capsule Commonly known as: FLORASTOR Take 1 capsule (250 mg total) by mouth 2 (two) times daily.   tamsulosin 0.4 MG Caps capsule Commonly known as: FLOMAX Take 0.4 mg by mouth at bedtime.   traMADol 50 MG tablet Commonly known as: ULTRAM Take 1 tablet (50 mg total) by mouth every 6 (six) hours.       Allergies: No Known Allergies  Past Medical History,  Surgical history, Social history, and Family History were reviewed and updated.  Review of Systems: Review of Systems  Constitutional: Negative.   HENT: Negative.   Eyes: Negative.   Respiratory: Negative.   Cardiovascular: Negative.   Gastrointestinal: Negative.   Genitourinary: Negative.   Musculoskeletal: Positive for joint pain.  Skin: Negative.   Neurological: Negative.   Endo/Heme/Allergies: Negative.   Psychiatric/Behavioral: Negative.      Physical Exam:  weight is 145 lb (65.8 kg). His oral temperature is 97.9 F (36.6 C). His blood pressure is 91/60 and his pulse is 91. His respiration is 18 and oxygen saturation is 100%.   Wt Readings from Last 3 Encounters:  03/30/21 145 lb (65.8 kg)  03/10/21 146 lb 6.2 oz (66.4 kg)  12/09/20 158 lb (71.7 kg)    Physical Exam Vitals reviewed.  HENT:     Head: Normocephalic and atraumatic.  Eyes:     Pupils: Pupils are equal, round, and reactive to light.  Cardiovascular:     Rate and Rhythm: Normal rate and regular rhythm.     Heart sounds: Normal heart sounds.  Pulmonary:     Effort: Pulmonary effort is normal.     Breath sounds: Normal breath sounds.  Abdominal:     General: Bowel sounds are normal.     Palpations: Abdomen is soft.  Musculoskeletal:        General: No tenderness or deformity. Normal range of motion.     Cervical back: Normal range of motion.  Lymphadenopathy:     Cervical: No cervical adenopathy.  Skin:    General: Skin is warm and dry.     Findings: No erythema or rash.  Neurological:     Mental Status: He is alert and oriented to person, place, and time.  Psychiatric:        Behavior: Behavior normal.        Thought Content: Thought content normal.        Judgment: Judgment normal.      Lab Results  Component Value Date   WBC 6.0 03/30/2021   HGB 11.6 (L) 03/30/2021   HCT 35.1 (L) 03/30/2021   MCV 95.9 03/30/2021   PLT 228 03/30/2021   Lab Results  Component Value Date    FERRITIN 938 (H) 02/23/2021   IRON 67 02/23/2021   TIBC 156 (L) 02/23/2021   UIBC 90 (L) 02/23/2021   IRONPCTSAT 43 02/23/2021   Lab Results  Component Value Date   RBC 3.66 (L) 03/30/2021   Lab Results  Component Value Date   KPAFRELGTCHN 1.0 (L) 02/23/2021   LAMBDASER 1.7 (L) 02/23/2021   KAPLAMBRATIO 0.59 02/23/2021   Lab Results  Component Value Date   IGGSERUM 250 (L) 02/23/2021   IGA 16 (L) 02/23/2021   IGMSERUM <5 (L) 02/23/2021   Lab Results  Component Value Date   TOTALPROTELP 4.6 (L) 02/23/2021   ALBUMINELP 2.8 (L) 02/23/2021   A1GS 0.3 02/23/2021   A2GS 0.8 02/23/2021   BETS 0.6 (L) 02/23/2021   GAMS 0.2 (L) 02/23/2021   MSPIKE Not Observed 02/23/2021     Chemistry      Component Value Date/Time   NA 137 03/30/2021 1339   K 4.2 03/30/2021 1339   CL 104 03/30/2021 1339   CO2 27 03/30/2021 1339   BUN 9 03/30/2021 1339   CREATININE 0.56 (L) 03/30/2021 1339      Component Value Date/Time   CALCIUM 8.2 (L) 03/30/2021 1339   ALKPHOS 93 03/30/2021 1339   AST 11 (L) 03/30/2021 1339   ALT 8 03/30/2021 1339   BILITOT 0.3 03/30/2021 1339       Impression and Plan: Mr. Clemons is a very pleasant 70 yo gentleman with kappa light chain myeloma with recent stem cell transplant with [facility] on 01/22/2021.   He is looking better now that he is out of the hospital.  I still do not know if he is ready for any type of maintenance therapy for the myeloma.  I really think he needs to see his transplant doctors before making any decisions.  Again, his quality of life and functional status are improving.  I am just happy that everything seems to be getting better.  Documented that he actually was over at [facility].  This did seem to help him out.  We will plan to get him back now in 1 month.  I think this would be very reasonable.  He was knows that he can call us if he has any problems.   Volanda Napoleon, MD 5/31/20222:49 PM

## 2021-03-30 NOTE — Progress Notes (Signed)
Oncology Nurse Navigator Documentation  Oncology Nurse Navigator Flowsheets 03/30/2021  Abnormal Finding Date -  Confirmed Diagnosis Date -  Diagnosis Status -  Planned Course of Treatment -  Phase of Treatment -  Chemotherapy Actual Start Date: -  Radiation Actual Start Date: -  Radiation Expected End Date: -  Radiation Actual End Date: -  Navigator Follow Up Date: 04/29/2021  Navigator Follow Up Reason: Follow-up Appointment  Navigator Location [facility]  Navigator Encounter Type Appt/Treatment Plan Review  Telephone -  Patient Visit Type MedOnc  Treatment Phase Post-Tx Follow-up  Barriers/Navigation Needs Coordination of Care;Education;Mobility Issues;Morbidities/Frailty;Pain  Education -  Interventions None Required  Acuity Level 2-Minimal Needs (1-2 Barriers Identified)  Referrals -  Coordination of Care -  Education Method -  Support Groups/Services Friends and Family  Time Spent with Patient 15

## 2021-03-30 NOTE — Telephone Encounter (Signed)
S/W pts wife and she is aware of the follow up but she noted that as a reminder ,Dr [name] wants this to be after his f/u at [facility] and that he said he would call them for the appt.  Lorriane Shire do you call [facility]?

## 2021-03-31 ENCOUNTER — Other Ambulatory Visit: Payer: Self-pay | Admitting: Hematology & Oncology

## 2021-03-31 LAB — KAPPA/LAMBDA LIGHT CHAINS
Kappa free light chain: 1.8 mg/L — ABNORMAL LOW (ref 3.3–19.4)
Kappa, lambda light chain ratio: 0.67 (ref 0.26–1.65)
Lambda free light chains: 2.7 mg/L — ABNORMAL LOW (ref 5.7–26.3)

## 2021-03-31 LAB — TSH: TSH: 2.178 u[IU]/mL (ref 0.320–4.118)

## 2021-04-05 DIAGNOSIS — Z9481 Bone marrow transplant status: Secondary | ICD-10-CM | POA: Insufficient documentation

## 2021-04-07 ENCOUNTER — Telehealth: Payer: Self-pay | Admitting: Nutrition

## 2021-04-07 ENCOUNTER — Ambulatory Visit: Payer: BC Managed Care – PPO | Admitting: Nutrition

## 2021-04-07 NOTE — Progress Notes (Signed)
Telephone follow-up completed.

## 2021-04-07 NOTE — Telephone Encounter (Signed)
Telephone follow-up completed with patient's wife.  Patient status post autologous stem cell transplant at [facility] for multiple myeloma on January 22, 2021.  Patient was then admitted to [facility] on April 26 with failure to thrive/malnutrition and subsequently [facility] rehab.    Wife reports patient is doing much better.  He is working with home PT/OT and will get up and walk with his walker.  He is able to do his own self-care.  Appetite has improved some and he is beginning to eat a little more.  He continues to have nausea and takes 1 Zofran a day which has improved his nausea and allows him to eat a little.  Denies constipation.  Wife is giving Senokot every other day.  Patient has not wanted to drink nutrition supplements but he did enjoy core power in the hospital.  She reports the doctor at [facility] instructed them to try to increase his protein.  Patient wishes he could have better pain control.  He is on tramadol and Tylenol.  Wife does not want multiple narcotics which caused increased confusion in the hospital.  She does not know who to contact to discuss.  Wife reports weight of 152 pounds at [facility] on Monday.  Weight documented as 158 pounds December 09, 2020 and 232 pounds August 2021.  Nutrition diagnosis: Unintended weight loss continues.  Intervention: Reviewed ways to add additional protein and provide increased variety to entice patient to want to eat more often.  Recommended Fair life nutrition plan or Fair life milk with fresh fruit to be blended into a smoothie. Continue nausea medication as needed. Suggested she contact nurse navigator for direction on who can manage patient's pain as an outpatient. Patient very appreciative of follow-up phone call.  I have encouraged her to contact me with any nutrition concerns in the future.  Monitoring, evaluation, goals: Patient will tolerate adequate calorie and protein intake to minimize weight loss and improved strength and  wellbeing.  Next visit: Please contact RD for questions and concerns.  **Disclaimer: This note was dictated with voice recognition software. Similar sounding words can inadvertently be transcribed and this note may contain transcription errors which may not have been corrected upon publication of note.**

## 2021-04-12 ENCOUNTER — Other Ambulatory Visit: Payer: Self-pay | Admitting: Physical Medicine and Rehabilitation

## 2021-04-12 DIAGNOSIS — R627 Adult failure to thrive: Secondary | ICD-10-CM

## 2021-04-13 ENCOUNTER — Other Ambulatory Visit: Payer: Self-pay | Admitting: Physical Medicine and Rehabilitation

## 2021-04-20 ENCOUNTER — Ambulatory Visit: Payer: BC Managed Care – PPO

## 2021-04-20 NOTE — Progress Notes (Signed)
Nutrition Follow-up:    Patient with s/p autologous stem cell transplant with [facility] for multiple myeloma on January 22, 2021.  Patient then admitted with [facility] on April 26 with failure to thrive/malnutrition and subsequently [facility] rehab.  Spoke with wife via phone for nutrition follow-up.  Wife reports that patient has been eating better.  Has gotten tired of the shakes and does not want to drink them.  Usually around 7am will eat a pimento cheese sandwich (2 pieces of bread) grits, pickle, orange juice, tomato juice, coffee and water.  Around 11am will have chicken salad sandwich, chips, pickle, watermelon or banana mayo and peanut butter sandwich. Around 2pm will have liver pudding sandiwch, green tea, water, or coke and pickle. Dinner is around [employer] ate 1/2 cheeseburger for [employer] day, macaroni salad, small piece of cake.  Wife reports that he does not want a hot meal or any breakfast foods.  Reports that he does not have nausea and sometimes vomits.  Giving zofran.  Reports having regular bowel movement.      Medications: reviewed  Labs: reviewed  Anthropometrics:   No new weight  152 lb at [place] 6/6  Wife thinks he is staying around 152 lb 145 lb on 5/31 per chart   NUTRITION DIAGNOSIS: Unintentional weight loss improving   INTERVENTION:  Encouraged wife to continue to offer high calorie, high protein meals and snacks.  Encouraged nausea medications    MONITORING, EVALUATION, GOAL: weight trends, intake   NEXT VISIT: phone call in ~2-3 weeks  Srinika Delone B. Zenia Resides, [place], [place] Registered Dietitian [phone] (mobile)

## 2021-04-29 ENCOUNTER — Inpatient Hospital Stay (HOSPITAL_BASED_OUTPATIENT_CLINIC_OR_DEPARTMENT_OTHER): Payer: BC Managed Care – PPO | Admitting: Hematology & Oncology

## 2021-04-29 ENCOUNTER — Other Ambulatory Visit: Payer: Self-pay

## 2021-04-29 ENCOUNTER — Encounter: Payer: Self-pay | Admitting: Hematology & Oncology

## 2021-04-29 ENCOUNTER — Encounter: Payer: Self-pay | Admitting: *Deleted

## 2021-04-29 ENCOUNTER — Inpatient Hospital Stay: Payer: BC Managed Care – PPO | Attending: Hematology & Oncology

## 2021-04-29 ENCOUNTER — Inpatient Hospital Stay: Payer: BC Managed Care – PPO

## 2021-04-29 VITALS — BP 114/58 | HR 85 | Temp 98.1°F | Resp 18 | Wt 156.0 lb

## 2021-04-29 DIAGNOSIS — Z9484 Stem cells transplant status: Secondary | ICD-10-CM | POA: Insufficient documentation

## 2021-04-29 DIAGNOSIS — Z923 Personal history of irradiation: Secondary | ICD-10-CM | POA: Diagnosis not present

## 2021-04-29 DIAGNOSIS — C9000 Multiple myeloma not having achieved remission: Secondary | ICD-10-CM | POA: Insufficient documentation

## 2021-04-29 DIAGNOSIS — Z79899 Other long term (current) drug therapy: Secondary | ICD-10-CM | POA: Insufficient documentation

## 2021-04-29 LAB — CMP (CANCER CENTER ONLY)
ALT: 11 U/L (ref 0–44)
AST: 16 U/L (ref 15–41)
Albumin: 2.9 g/dL — ABNORMAL LOW (ref 3.5–5.0)
Alkaline Phosphatase: 88 U/L (ref 38–126)
Anion gap: 6 (ref 5–15)
BUN: 6 mg/dL — ABNORMAL LOW (ref 8–23)
CO2: 27 mmol/L (ref 22–32)
Calcium: 8.6 mg/dL — ABNORMAL LOW (ref 8.9–10.3)
Chloride: 106 mmol/L (ref 98–111)
Creatinine: 0.57 mg/dL — ABNORMAL LOW (ref 0.61–1.24)
GFR, Estimated: >60 mL/min (ref >60)
Glucose, Bld: 78 mg/dL (ref 70–99)
Potassium: 3.8 mmol/L (ref 3.5–5.1)
Sodium: 139 mmol/L (ref 135–145)
Total Bilirubin: 0.4 mg/dL (ref 0.3–1.2)
Total Protein: 4.2 g/dL — ABNORMAL LOW (ref 6.5–8.1)

## 2021-04-29 LAB — CBC WITH DIFFERENTIAL (CANCER CENTER ONLY)
Abs Immature Granulocytes: 0.02 10*3/uL (ref 0.00–0.07)
Basophils Absolute: 0 10*3/uL (ref 0.0–0.1)
Basophils Relative: 0 %
Eosinophils Absolute: 0.1 10*3/uL (ref 0.0–0.5)
Eosinophils Relative: 1 %
HCT: 35.1 % — ABNORMAL LOW (ref 39.0–52.0)
Hemoglobin: 11.6 g/dL — ABNORMAL LOW (ref 13.0–17.0)
Immature Granulocytes: 0 %
Lymphocytes Relative: 29 %
Lymphs Abs: 1.7 10*3/uL (ref 0.7–4.0)
MCH: 31.8 pg (ref 26.0–34.0)
MCHC: 33 g/dL (ref 30.0–36.0)
MCV: 96.2 fL (ref 80.0–100.0)
Monocytes Absolute: 0.8 10*3/uL (ref 0.1–1.0)
Monocytes Relative: 13 %
Neutro Abs: 3.3 10*3/uL (ref 1.7–7.7)
Neutrophils Relative %: 57 %
Platelet Count: 192 10*3/uL (ref 150–400)
RBC: 3.65 MIL/uL — ABNORMAL LOW (ref 4.22–5.81)
RDW: 14.7 % (ref 11.5–15.5)
WBC Count: 5.9 10*3/uL (ref 4.0–10.5)
nRBC: 0 % (ref 0.0–0.2)

## 2021-04-29 LAB — URIC ACID: Uric Acid, Serum: 4.7 mg/dL (ref 3.7–8.6)

## 2021-04-29 LAB — MAGNESIUM: Magnesium: 1.2 mg/dL — ABNORMAL LOW (ref 1.7–2.4)

## 2021-04-29 LAB — LACTATE DEHYDROGENASE: LDH: 134 U/L (ref 98–192)

## 2021-04-29 MED ORDER — MAGNESIUM SULFATE 2 GM/50ML IV SOLN: 2.0000 g | Freq: Once | INTRAVENOUS | Status: DC

## 2021-04-29 MED ORDER — LOPERAMIDE HCL 2 MG PO TABS
4.0000 mg | ORAL_TABLET | Freq: Once | ORAL | Status: AC
  Administered 2021-04-29: 4 mg via ORAL
  Filled 2021-04-29: qty 2

## 2021-04-29 MED ORDER — LOPERAMIDE HCL 2 MG PO CAPS
ORAL_CAPSULE | ORAL | Status: AC
  Filled 2021-04-29: qty 2

## 2021-04-29 MED ORDER — SACCHAROMYCES BOULARDII 250 MG PO CAPS: 250.0000 mg | ORAL_CAPSULE | Freq: Two times a day (BID) | ORAL | 4 refills | Status: DC

## 2021-04-29 MED ORDER — MAGNESIUM SULFATE 2 GM/50ML IV SOLN
2.0000 g | Freq: Once | INTRAVENOUS | Status: AC
Start: 2021-04-29 — End: 2021-04-29
  Administered 2021-04-29: 2 g via INTRAVENOUS
  Filled 2021-04-29 (×2): qty 50

## 2021-04-29 MED ORDER — LOPERAMIDE HCL 2 MG PO TABS
2.0000 mg | ORAL_TABLET | Freq: Once | ORAL | Status: DC
  Filled 2021-04-29: qty 1

## 2021-04-29 NOTE — Progress Notes (Signed)
Spoke to patient's sister in the lobby after patient's visit with Dr [name]. She has two requests for patient while he's in the office. Patient had a hickman catheter removed and there was a stitch left behind. She requests that we look at this and see if it can be removed. This request was given to the Infusion RN caring for him.   She also states that the patient received a covid vaccine on 04/26/2021 and they would like him to receive his second dose 30 days later. Per the pharmacy, if the dose is given prior to 90 days they need an MD to approve this request. Desk nurse will provide the patient with a script/letter signed by Dr [name] prior to him leaving today.   Oncology Nurse Navigator Documentation  Oncology Nurse Navigator Flowsheets 04/29/2021  Abnormal Finding Date -  Confirmed Diagnosis Date -  Diagnosis Status -  Planned Course of Treatment -  Phase of Treatment -  Chemotherapy Actual Start Date: -  Radiation Actual Start Date: -  Radiation Expected End Date: -  Radiation Actual End Date: -  Navigator Follow Up Date: 06/11/2021  Navigator Follow Up Reason: Follow-up Appointment;Chemotherapy  Navigator Location [facility]  Navigator Encounter Type Lobby;Treatment;Appt/Treatment Plan Review  Telephone -  Patient Visit Type MedOnc  Treatment Phase Post-Tx Follow-up  Barriers/Navigation Needs Coordination of Care;Education;Mobility Issues;Morbidities/Frailty;Pain  Education Other  Interventions Coordination of Care;Education;Psycho-Social Support  Acuity Level 2-Minimal Needs (1-2 Barriers Identified)  Referrals -  Coordination of Care Other  Education Method Verbal  Support Groups/Services Friends and Family  Time Spent with Patient 46

## 2021-04-29 NOTE — Patient Instructions (Signed)
Magnesium Sulfate Injection What is this medication? MAGNESIUM SULFATE (mag NEE zee um SUL fate) prevents and treats low levels of magnesium in your body. It may also be used to prevent and treat seizures during pregnancy in people with high blood pressure disorders, such as preeclampsia or eclampsia. Magnesium plays an important role in maintaining the health of your muscles and nervous system. This medicine may be used for other purposes; ask your health care provider or pharmacist if you have questions. What should I tell my care team before I take this medication? They need to know if you have any of these conditions: Heart disease History of irregular heart beat Kidney disease An unusual or allergic reaction to magnesium sulfate, medications, foods, dyes, or preservatives Pregnant or trying to get pregnant Breast-feeding How should I use this medication? This medication is for infusion into a vein. It is given in a hospital or clinic setting. Talk to your care team about the use of this medication in children. While this medication may be prescribed for selected conditions, precautions do apply. Overdosage: If you think you have taken too much of this medicine contact a poison control center or emergency room at once. NOTE: This medicine is only for you. Do not share this medicine with others. What if I miss a dose? This does not apply. What may interact with this medication? Certain medications for anxiety or sleep Certain medications for seizures like phenobarbital Digoxin Medications that relax muscles for surgery Narcotic medications for pain This list may not describe all possible interactions. Give your health care provider a list of all the medicines, herbs, non-prescription drugs, or dietary supplements you use. Also tell them if you smoke, drink alcohol, or use illegal drugs. Some items may interact with your medicine. What should I watch for while using this medication? Your  condition will be monitored carefully while you are receiving this medication. You may need blood work done while you are receiving this medication. What side effects may I notice from receiving this medication? Side effects that you should report to your care team as soon as possible: Allergic reactions-skin rash, itching, hives, swelling of the face, lips, tongue, or throat High magnesium level-confusion, drowsiness, facial flushing, redness, sweating, muscle weakness, fast or irregular heartbeat, trouble breathing Low blood pressure-dizziness, feeling faint or lightheaded, blurry vision Side effects that usually do not require medical attention (report to your care team if they continue or are bothersome): Headache Nausea This list may not describe all possible side effects. Call your doctor for medical advice about side effects. You may report side effects to FDA at 1-800-FDA-1088. Where should I keep my medication? This medication is given in a hospital or clinic and will not be stored at home. NOTE: This sheet is a summary. It may not cover all possible information. If you have questions about this medicine, talk to your doctor, pharmacist, or health care provider.  2022 Elsevier/Gold Standard (2020-12-07 13:10:26)  

## 2021-04-29 NOTE — Progress Notes (Signed)
Hematology and Oncology Follow Up Visit  Bruce Smith [id number] [birth year] 70 y.o. 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  He is making improvements.  This is a slow but steady process.  He does have his issues.  He does come in with a rolling walker.  His magnesium is always been on the low side.  He will get some IV magnesium today.  We have to think about get him on some kind of maintenance wife for follow-up.  I think that Bruce Smith would be reasonable.  He cannot tolerate Revlimid.  He does have bad cytogenetics.  I do think he needs another bone marrow biopsy so we could reassess his cytogenetics.  Pain wise, he seems to be doing little better with this.  His appetite is improving.  He is gained some weight.  He has had no issues with diarrhea although he gets this when he does take magnesium.  He has had no fever.  There is been no bleeding.  Overall, his performance status is ECOG 2.    Medications:  Allergies as of 04/29/2021   No Known Allergies      Medication List        Accurate as of April 29, 2021  2:55 PM. If you have any questions, ask your nurse or doctor.          STOP taking these medications    omeprazole 40 MG capsule Commonly known as: PRILOSEC Stopped by: Volanda Napoleon, MD       TAKE these medications    acetaminophen 500 MG tablet Commonly known as:  TYLENOL Take 1 tablet (500 mg total) by mouth every 6 (six) hours.   acyclovir 400 MG tablet Commonly known as: ZOVIRAX Take 400 mg by mouth 2 (two) times daily.   atorvastatin 40 MG tablet Commonly known as: LIPITOR Take 0.5 tablets by mouth daily.   calcium carbonate 1250 (500 Ca) MG tablet Commonly known as: OS-CAL - dosed in mg of elemental calcium Take 1 tablet (500 mg of elemental calcium total) by mouth 2 (two) times daily with a meal.   celecoxib 100 MG capsule Commonly known as: CELEBREX TAKE 1 CAPSULE BY MOUTH TWICE A DAY WIOTH FOOD FOR PAIN/INFLAMMATION   doxycycline 100 MG tablet Commonly known as: VIBRA-TABS Take 100 mg by mouth 2 (two) times daily.   levETIRAcetam 500 MG tablet Commonly known as: KEPPRA TAKE 1 TABLET BY MOUTH 2 TIMES A DAY   lisinopril 5 MG tablet Commonly known as: ZESTRIL Take 7.5 mg by mouth daily.   loperamide 2 MG capsule Commonly known as: IMODIUM Take 1 capsule (2 mg total) by mouth as needed for diarrhea or loose stools.   mupirocin ointment 2 % Commonly known as: BACTROBAN SMARTSIG:Topical Every 12 Hours   ondansetron 8 MG tablet Commonly known as: ZOFRAN TAKE 1 TABLET BY MOUTH  EVERY 8 HOURS AS NEEDED NAUSEA AND VOMITING   pantoprazole 40 MG tablet Commonly known as: PROTONIX Take 1 tablet (40 mg total) by mouth 2 (two) times daily before a meal.   pantoprazole 40 MG tablet Commonly known as: PROTONIX Take 40 mg by mouth daily.   polycarbophil 625 MG tablet Commonly known as: FIBERCON Take 1 tablet (625 mg total) by mouth daily.   pregabalin 150 MG capsule Commonly known as: LYRICA Take 1 capsule (150 mg total) by mouth at bedtime.   pregabalin 100 MG capsule Commonly known as: LYRICA Take 1 capsule (100 mg total) by mouth daily.   saccharomyces boulardii 250 MG capsule Commonly known as: FLORASTOR Take 1 capsule (250 mg total) by mouth 2 (two) times daily. What changed: Another medication with the same name was  added. Make sure you understand how and when to take each. Changed by: Volanda Napoleon, MD   saccharomyces boulardii 250 MG capsule Commonly known as: Florastor Take 1 capsule (250 mg total) by mouth 2 (two) times daily. What changed: You were already taking a medication with the same name, and this prescription was added. Make sure you understand how and when to take each. Changed by: Volanda Napoleon, MD   tamsulosin 0.4 MG Caps capsule Commonly known as: FLOMAX Take 0.4 mg by mouth at bedtime.   traMADol 50 MG tablet Commonly known as: ULTRAM Take 1 tablet (50 mg total) by mouth every 6 (six) hours.   traMADol 50 MG tablet Commonly known as: ULTRAM Take 50 mg by mouth.        Allergies: No Known Allergies  Past Medical History, Surgical history, Social history, and Family History were reviewed and updated.  Review of Systems: Review of Systems  Constitutional: Negative.   HENT: Negative.    Eyes: Negative.   Respiratory: Negative.    Cardiovascular: Negative.   Gastrointestinal: Negative.   Genitourinary: Negative.   Musculoskeletal:  Positive for joint pain.  Skin: Negative.   Neurological: Negative.   Endo/Heme/Allergies: Negative.   Psychiatric/Behavioral: Negative.      Physical Exam:  weight is 156 lb (70.8 kg). His oral temperature is 98.1 F (36.7 C). His blood pressure is 114/58 (abnormal) and his pulse is 85. His respiration is 18 and oxygen saturation is 100%.   Wt Readings from Last 3 Encounters:  04/29/21 156 lb (70.8 kg)  03/30/21 145 lb (65.8 kg)  03/10/21 146 lb 6.2 oz (66.4 kg)    Physical Exam Vitals reviewed.  HENT:     Head: Normocephalic and atraumatic.  Eyes:     Pupils: Pupils are equal, round, and reactive to light.  Cardiovascular:     Rate and Rhythm: Normal rate and regular rhythm.     Heart sounds: Normal heart sounds.  Pulmonary:     Effort: Pulmonary effort is normal.     Breath sounds: Normal breath sounds.  Abdominal:      General: Bowel sounds are normal.     Palpations: Abdomen is soft.  Musculoskeletal:        General: No tenderness or deformity. Normal range of motion.     Cervical back: Normal range of motion.  Lymphadenopathy:     Cervical: No cervical adenopathy.  Skin:    General: Skin is warm and dry.     Findings: No erythema or rash.  Neurological:     Mental Status: He is alert and oriented to person, place, and time.  Psychiatric:  Behavior: Behavior normal.        Thought Content: Thought content normal.        Judgment: Judgment normal.     Lab Results  Component Value Date   WBC 5.9 04/29/2021   HGB 11.6 (L) 04/29/2021   HCT 35.1 (L) 04/29/2021   MCV 96.2 04/29/2021   PLT 192 04/29/2021   Lab Results  Component Value Date   FERRITIN 938 (H) 02/23/2021   IRON 67 02/23/2021   TIBC 156 (L) 02/23/2021   UIBC 90 (L) 02/23/2021   IRONPCTSAT 43 02/23/2021   Lab Results  Component Value Date   RBC 3.65 (L) 04/29/2021   Lab Results  Component Value Date   KPAFRELGTCHN 1.8 (L) 03/30/2021   LAMBDASER 2.7 (L) 03/30/2021   KAPLAMBRATIO 0.67 03/30/2021   Lab Results  Component Value Date   IGGSERUM 250 (L) 02/23/2021   IGA 16 (L) 02/23/2021   IGMSERUM <5 (L) 02/23/2021   Lab Results  Component Value Date   TOTALPROTELP 4.6 (L) 02/23/2021   ALBUMINELP 2.8 (L) 02/23/2021   A1GS 0.3 02/23/2021   A2GS 0.8 02/23/2021   BETS 0.6 (L) 02/23/2021   GAMS 0.2 (L) 02/23/2021   MSPIKE Not Observed 02/23/2021     Chemistry      Component Value Date/Time   NA 139 04/29/2021 0756   K 3.8 04/29/2021 0756   CL 106 04/29/2021 0756   CO2 27 04/29/2021 0756   BUN 6 (L) 04/29/2021 0756   CREATININE 0.57 (L) 04/29/2021 0756      Component Value Date/Time   CALCIUM 8.6 (L) 04/29/2021 0756   ALKPHOS 88 04/29/2021 0756   AST 16 04/29/2021 0756   ALT 11 04/29/2021 0756   BILITOT 0.4 04/29/2021 0756       Impression and Plan: Mr. Brodowski is a very pleasant 70 yo  gentleman with kappa light chain myeloma with recent stem cell transplant with [facility] on 01/22/2021.   I am glad that he is making steady improvement.  I know this is a incredibly long process for him.  He is trying really really hard.  He has incredible support from his family.  He is truly motivated.  I really appreciate how much he is trying.  We are trying our best to work with him and continue to get him better so he will be functional and enjoy a good quality of life.  We will go ahead and give him some IV magnesium.  I talked to him about the maintenance Faspro.  I talked him about the need for a bone marrow biopsy.  We will try to get the bone marrow biopsy set up for a couple weeks.  I will be incredibly interested to see what his cytogenetics are.  I will plan to start the Faspro in 2 weeks.  I will see him back in 6 weeks which will be his second cycle.   Volanda Napoleon, MD 6/30/20222:55 PM

## 2021-04-30 LAB — IGG, IGA, IGM
IgA: 21 mg/dL — ABNORMAL LOW (ref 61–437)
IgG (Immunoglobin G), Serum: 264 mg/dL — ABNORMAL LOW (ref 603–1613)
IgM (Immunoglobulin M), Srm: 10 mg/dL — ABNORMAL LOW (ref 20–172)

## 2021-04-30 LAB — KAPPA/LAMBDA LIGHT CHAINS
Kappa free light chain: 3.9 mg/L (ref 3.3–19.4)
Kappa, lambda light chain ratio: 1.5 (ref 0.26–1.65)
Lambda free light chains: 2.6 mg/L — ABNORMAL LOW (ref 5.7–26.3)

## 2021-05-01 ENCOUNTER — Other Ambulatory Visit: Payer: Self-pay | Admitting: Physical Medicine and Rehabilitation

## 2021-05-01 MED ORDER — TRAMADOL HCL 50 MG PO TABS: 50.0000 mg | ORAL_TABLET | Freq: Four times a day (QID) | ORAL | 0 refills | Status: DC

## 2021-05-01 NOTE — Addendum Note (Signed)
Addended by: Burney Gauze R on: 05/01/2021 02:10 PM   Modules accepted: Orders

## 2021-05-05 LAB — IMMUNOFIXATION REFLEX, SERUM
IgA: 23 mg/dL — ABNORMAL LOW (ref 61–437)
IgG (Immunoglobin G), Serum: 258 mg/dL — ABNORMAL LOW (ref 603–1613)
IgM (Immunoglobulin M), Srm: 11 mg/dL — ABNORMAL LOW (ref 20–172)

## 2021-05-05 LAB — PROTEIN ELECTROPHORESIS, SERUM, WITH REFLEX
A/G Ratio: 1.7 (ref 0.7–1.7)
Albumin ELP: 2.5 g/dL — ABNORMAL LOW (ref 2.9–4.4)
Alpha-1-Globulin: 0.2 g/dL (ref 0.0–0.4)
Alpha-2-Globulin: 0.6 g/dL (ref 0.4–1.0)
Beta Globulin: 0.5 g/dL — ABNORMAL LOW (ref 0.7–1.3)
Gamma Globulin: 0.2 g/dL — ABNORMAL LOW (ref 0.4–1.8)
Globulin, Total: 1.5 g/dL — ABNORMAL LOW (ref 2.2–3.9)
M-Spike, %: 0.1 g/dL — ABNORMAL HIGH
SPEP Interpretation: 0
Total Protein ELP: 4 g/dL — ABNORMAL LOW (ref 6.0–8.5)

## 2021-05-11 ENCOUNTER — Ambulatory Visit: Payer: BC Managed Care – PPO

## 2021-05-11 ENCOUNTER — Other Ambulatory Visit: Payer: Self-pay | Admitting: Physical Medicine and Rehabilitation

## 2021-05-11 NOTE — Progress Notes (Signed)
Nutrition Follow-up:    Patient with s/p autologous stem cell transplant with [facility] for multiple myeloma on March 25th, 2022.  Patient then admitted to [facility] on April 26 with failure to thrive/malnutrition and subsequently [facility] rehab.  Spoke with wife for nutrition follow-up.  Wife reports that patient's appetite continues to be good.  Eating pimento cheese sandwich, pickle, grits, tomato juice, water, coffee for breakfast. Chips and dip mid am snack. Sandwich for lunch with chips and pickle. Mid pm snack of cheese and pickles and then meat and vegetables for supper (ribs recently).  Wife reports nausea is better, still has some dry heaves    Medications: reviewed  Labs: reviewed  Anthropometrics:   Weight 156 lb on 6/30 and [facility]  152 lb at [facility] on 6/6 145 lb on 5/31 per chart   NUTRITION DIAGNOSIS: Unintentional weight loss improving   INTERVENTION:  Continue high calorie, high protein meals to promote weight gain Wife given contact information    MONITORING, EVALUATION, GOAL: weight trends, intake   NEXT VISIT: phone call in 4-6 weeks  Dekota Kirlin B. Zenia Resides, [place], [place] Registered Dietitian [phone] (mobile)

## 2021-05-13 ENCOUNTER — Other Ambulatory Visit: Payer: Self-pay

## 2021-05-13 ENCOUNTER — Inpatient Hospital Stay: Payer: BC Managed Care – PPO | Attending: Hematology & Oncology

## 2021-05-13 ENCOUNTER — Inpatient Hospital Stay: Payer: BC Managed Care – PPO

## 2021-05-13 VITALS — BP 120/68 | HR 82 | Temp 98.0°F | Resp 17

## 2021-05-13 DIAGNOSIS — C9000 Multiple myeloma not having achieved remission: Secondary | ICD-10-CM | POA: Diagnosis present

## 2021-05-13 DIAGNOSIS — Z5112 Encounter for antineoplastic immunotherapy: Secondary | ICD-10-CM | POA: Diagnosis present

## 2021-05-13 DIAGNOSIS — Z79899 Other long term (current) drug therapy: Secondary | ICD-10-CM | POA: Diagnosis not present

## 2021-05-13 LAB — CBC WITH DIFFERENTIAL (CANCER CENTER ONLY)
Abs Immature Granulocytes: 0.03 10*3/uL (ref 0.00–0.07)
Basophils Absolute: 0 10*3/uL (ref 0.0–0.1)
Basophils Relative: 0 %
Eosinophils Absolute: 0.1 10*3/uL (ref 0.0–0.5)
Eosinophils Relative: 1 %
HCT: 36.1 % — ABNORMAL LOW (ref 39.0–52.0)
Hemoglobin: 11.7 g/dL — ABNORMAL LOW (ref 13.0–17.0)
Immature Granulocytes: 0 %
Lymphocytes Relative: 20 %
Lymphs Abs: 1.5 10*3/uL (ref 0.7–4.0)
MCH: 31.5 pg (ref 26.0–34.0)
MCHC: 32.4 g/dL (ref 30.0–36.0)
MCV: 97.3 fL (ref 80.0–100.0)
Monocytes Absolute: 0.8 10*3/uL (ref 0.1–1.0)
Monocytes Relative: 11 %
Neutro Abs: 4.7 10*3/uL (ref 1.7–7.7)
Neutrophils Relative %: 68 %
Platelet Count: 197 10*3/uL (ref 150–400)
RBC: 3.71 MIL/uL — ABNORMAL LOW (ref 4.22–5.81)
RDW: 14.5 % (ref 11.5–15.5)
WBC Count: 7.1 10*3/uL (ref 4.0–10.5)
nRBC: 0 % (ref 0.0–0.2)

## 2021-05-13 LAB — CMP (CANCER CENTER ONLY)
ALT: 17 U/L (ref 0–44)
AST: 17 U/L (ref 15–41)
Albumin: 3.2 g/dL — ABNORMAL LOW (ref 3.5–5.0)
Alkaline Phosphatase: 110 U/L (ref 38–126)
Anion gap: 6 (ref 5–15)
BUN: 11 mg/dL (ref 8–23)
CO2: 30 mmol/L (ref 22–32)
Calcium: 8.7 mg/dL — ABNORMAL LOW (ref 8.9–10.3)
Chloride: 107 mmol/L (ref 98–111)
Creatinine: 0.63 mg/dL (ref 0.61–1.24)
GFR, Estimated: >60 mL/min (ref >60)
Glucose, Bld: 99 mg/dL (ref 70–99)
Potassium: 3.9 mmol/L (ref 3.5–5.1)
Sodium: 143 mmol/L (ref 135–145)
Total Bilirubin: 0.3 mg/dL (ref 0.3–1.2)
Total Protein: 4.5 g/dL — ABNORMAL LOW (ref 6.5–8.1)

## 2021-05-13 LAB — MAGNESIUM: Magnesium: 1.8 mg/dL (ref 1.7–2.4)

## 2021-05-13 MED ORDER — FAMOTIDINE 20 MG PO TABS: 40.0000 mg | ORAL_TABLET | Freq: Once | ORAL | Status: DC

## 2021-05-13 MED ORDER — DARATUMUMAB-HYALURONIDASE-FIHJ 1800-30000 MG-UT/15ML ~~LOC~~ SOLN
1800.0000 mg | Freq: Once | SUBCUTANEOUS | Status: AC
  Administered 2021-05-13: 1800 mg via SUBCUTANEOUS
  Filled 2021-05-13: qty 15

## 2021-05-13 MED ORDER — DIPHENHYDRAMINE HCL 25 MG PO CAPS: 50.0000 mg | ORAL_CAPSULE | Freq: Once | ORAL | Status: DC

## 2021-05-13 MED ORDER — ACETAMINOPHEN 325 MG PO TABS: 650.0000 mg | ORAL_TABLET | Freq: Once | ORAL | Status: DC

## 2021-05-13 NOTE — Patient Instructions (Addendum)
[facility]  Discharge Instructions: Thank you for choosing [facility] to provide your oncology and hematology care.   If you have a lab appointment with the [facility], please go directly to the [facility] and check in at the registration area.  Wear comfortable clothing and clothing appropriate for easy access to any Portacath or PICC line.   We strive to give you quality time with your provider. You may need to reschedule your appointment if you arrive late (15 or more minutes).  Arriving late affects you and other patients whose appointments are after yours.  Also, if you miss three or more appointments without notifying the office, you may be dismissed from the clinic at the provider's discretion.      For prescription refill requests, have your pharmacy contact our office and allow 72 hours for refills to be completed.    Today you received the following chemotherapy and/or immunotherapy agents Darzalex Faspro      To help prevent nausea and vomiting after your treatment, we encourage you to take your nausea medication as directed.  BELOW ARE SYMPTOMS THAT SHOULD BE REPORTED IMMEDIATELY: *FEVER GREATER THAN 100.4 F (38 C) OR HIGHER *CHILLS OR SWEATING *NAUSEA AND VOMITING THAT IS NOT CONTROLLED WITH YOUR NAUSEA MEDICATION *UNUSUAL SHORTNESS OF BREATH *UNUSUAL BRUISING OR BLEEDING *URINARY PROBLEMS (pain or burning when urinating, or frequent urination) *BOWEL PROBLEMS (unusual diarrhea, constipation, pain near the anus) TENDERNESS IN MOUTH AND THROAT WITH OR WITHOUT PRESENCE OF ULCERS (sore throat, sores in mouth, or a toothache) UNUSUAL RASH, SWELLING OR PAIN  UNUSUAL VAGINAL DISCHARGE OR ITCHING   Items with * indicate a potential emergency and should be followed up as soon as possible or go to the Emergency Department if any problems should occur.  Please show the CHEMOTHERAPY ALERT CARD or IMMUNOTHERAPY ALERT CARD at check-in to  the Emergency Department and triage nurse. Should you have questions after your visit or need to cancel or reschedule your appointment, please contact [facility]  [phone] and follow the prompts.  Office hours are 8:00 a.m. to 4:30 p.m. Monday - Friday. Please note that voicemails left after 4:00 p.m. may not be returned until the following business day.  We are closed weekends and major holidays. You have access to a nurse at all times for urgent questions. Please call the main number to the clinic [phone] and follow the prompts.  For any non-urgent questions, you may also contact your provider using MyChart. We now offer e-Visits for anyone 70 and older to request care online for non-urgent symptoms. For details visit [email or web address].   Also download the MyChart app! Go to the app store, search "MyChart", open the app, select [facility], and log in with your MyChart username and password.  Due to Covid, a mask is required upon entering the hospital/clinic. If you do not have a mask, one will be given to you upon arrival. For doctor visits, patients may have 1 support person aged 70 or older with them. For treatment visits, patients cannot have anyone with them due to current Covid guidelines and our immunocompromised population. [facility]  Discharge Instructions: Thank you for choosing [facility] to provide your oncology and hematology care.   If you have a lab appointment with the [facility], please go directly to the [facility] and check in at the registration area.  Wear comfortable  clothing and clothing appropriate for easy access to any Portacath or PICC line.   We strive to give you quality time with your provider. You may need to reschedule your appointment if you arrive late (15 or more minutes).  Arriving late affects you and other patients whose appointments are after yours.  Also, if you miss  three or more appointments without notifying the office, you may be dismissed from the clinic at the provider's discretion.      For prescription refill requests, have your pharmacy contact our office and allow 72 hours for refills to be completed.    Today you received the following chemotherapy and/or immunotherapy agents fastpro      To help prevent nausea and vomiting after your treatment, we encourage you to take your nausea medication as directed.  BELOW ARE SYMPTOMS THAT SHOULD BE REPORTED IMMEDIATELY: *FEVER GREATER THAN 100.4 F (38 C) OR HIGHER *CHILLS OR SWEATING *NAUSEA AND VOMITING THAT IS NOT CONTROLLED WITH YOUR NAUSEA MEDICATION *UNUSUAL SHORTNESS OF BREATH *UNUSUAL BRUISING OR BLEEDING *URINARY PROBLEMS (pain or burning when urinating, or frequent urination) *BOWEL PROBLEMS (unusual diarrhea, constipation, pain near the anus) TENDERNESS IN MOUTH AND THROAT WITH OR WITHOUT PRESENCE OF ULCERS (sore throat, sores in mouth, or a toothache) UNUSUAL RASH, SWELLING OR PAIN  UNUSUAL VAGINAL DISCHARGE OR ITCHING   Items with * indicate a potential emergency and should be followed up as soon as possible or go to the Emergency Department if any problems should occur.  Please show the CHEMOTHERAPY ALERT CARD or IMMUNOTHERAPY ALERT CARD at check-in to the Emergency Department and triage nurse. Should you have questions after your visit or need to cancel or reschedule your appointment, please contact [facility]  [phone] and follow the prompts.  Office hours are 8:00 a.m. to 4:30 p.m. Monday - Friday. Please note that voicemails left after 4:00 p.m. may not be returned until the following business day.  We are closed weekends and major holidays. You have access to a nurse at all times for urgent questions. Please call the main number to the clinic [phone] and follow the prompts.  For any non-urgent questions, you may also contact your provider using  MyChart. We now offer e-Visits for anyone 70 and older to request care online for non-urgent symptoms. For details visit [email or web address].   Also download the MyChart app! Go to the app store, search "MyChart", open the app, select [facility], and log in with your MyChart username and password.  Due to Covid, a mask is required upon entering the hospital/clinic. If you do not have a mask, one will be given to you upon arrival. For doctor visits, patients may have 1 support person aged 28 or older with them. For treatment visits, patients cannot have anyone with them due to current Covid guidelines and our immunocompromised population.

## 2021-05-14 ENCOUNTER — Other Ambulatory Visit: Payer: Self-pay | Admitting: Radiology

## 2021-05-17 ENCOUNTER — Ambulatory Visit (HOSPITAL_COMMUNITY)
Admission: RE | Admit: 2021-05-17 | Discharge: 2021-05-17 | Disposition: A | Discharge status: Home / Self Care | Payer: BC Managed Care – PPO | Source: Ambulatory Visit | Attending: Hematology & Oncology | Admitting: Hematology & Oncology

## 2021-05-17 ENCOUNTER — Other Ambulatory Visit: Payer: Self-pay

## 2021-05-17 ENCOUNTER — Encounter (HOSPITAL_COMMUNITY): Payer: Self-pay

## 2021-05-17 DIAGNOSIS — Z9484 Stem cells transplant status: Secondary | ICD-10-CM | POA: Diagnosis not present

## 2021-05-17 DIAGNOSIS — C9000 Multiple myeloma not having achieved remission: Secondary | ICD-10-CM | POA: Insufficient documentation

## 2021-05-17 LAB — CBC WITH DIFFERENTIAL/PLATELET
Abs Immature Granulocytes: 0.02 10*3/uL (ref 0.00–0.07)
Basophils Absolute: 0 10*3/uL (ref 0.0–0.1)
Basophils Relative: 0 %
Eosinophils Absolute: 0.1 10*3/uL (ref 0.0–0.5)
Eosinophils Relative: 1 %
HCT: 36.1 % — ABNORMAL LOW (ref 39.0–52.0)
Hemoglobin: 11.7 g/dL — ABNORMAL LOW (ref 13.0–17.0)
Immature Granulocytes: 0 %
Lymphocytes Relative: 15 %
Lymphs Abs: 1 10*3/uL (ref 0.7–4.0)
MCH: 31.8 pg (ref 26.0–34.0)
MCHC: 32.4 g/dL (ref 30.0–36.0)
MCV: 98.1 fL (ref 80.0–100.0)
Monocytes Absolute: 0.8 10*3/uL (ref 0.1–1.0)
Monocytes Relative: 13 %
Neutro Abs: 4.3 10*3/uL (ref 1.7–7.7)
Neutrophils Relative %: 71 %
Platelets: 189 10*3/uL (ref 150–400)
RBC: 3.68 MIL/uL — ABNORMAL LOW (ref 4.22–5.81)
RDW: 14.6 % (ref 11.5–15.5)
WBC: 6.2 10*3/uL (ref 4.0–10.5)
nRBC: 0 % (ref 0.0–0.2)

## 2021-05-17 IMAGING — CT CT BIOPSY AND ASPIRATION BONE MARROW
1 of 2 series · 15 of 32 positions shown, 19 images · non-contrast
Comparison: none

CLINICAL DATA: History of multiple myeloma and status post
treatment and stem cell transplant. Bone marrow biopsy requested to
assess for response to treatment.

[Series 2: i-spiral 5.0 bf37 · axial · 0.98mm/px · z∈[+954,+1066]mm · 15 of 37 slices shown, 19 images]
[im 3/37  soft-tissue]
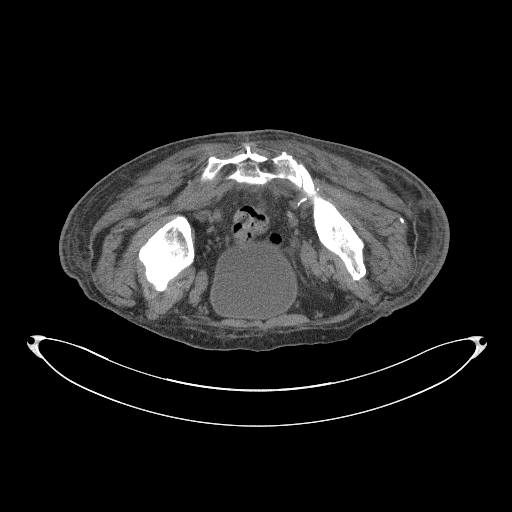
[im 3/37  bone]
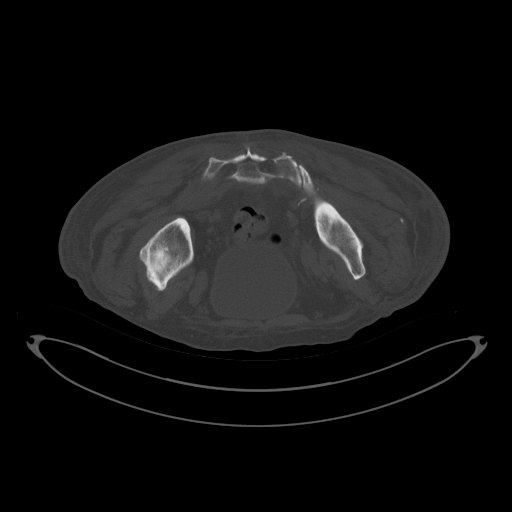
[im 5/37  soft-tissue]
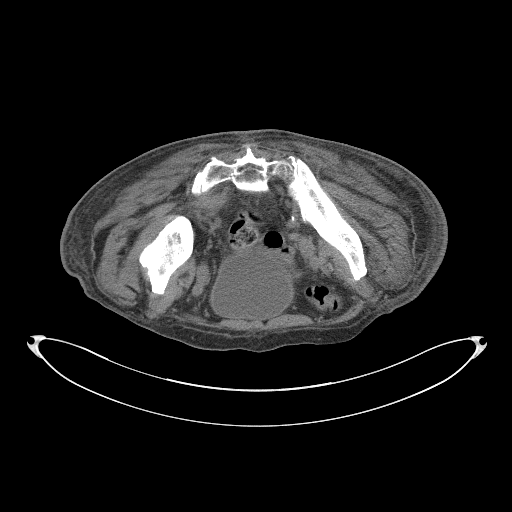
[im 8/37  soft-tissue]
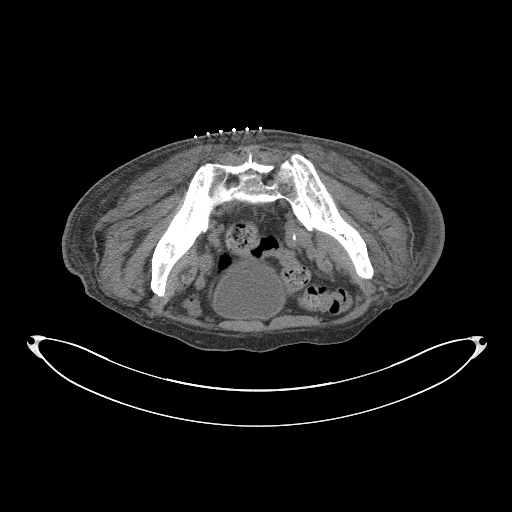
[im 10/37  soft-tissue]
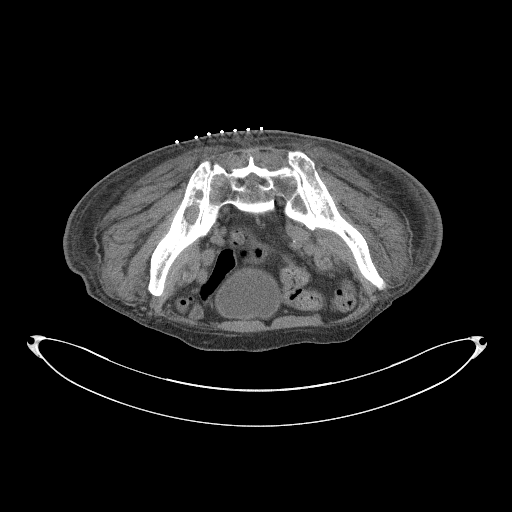
[im 13/37  soft-tissue]
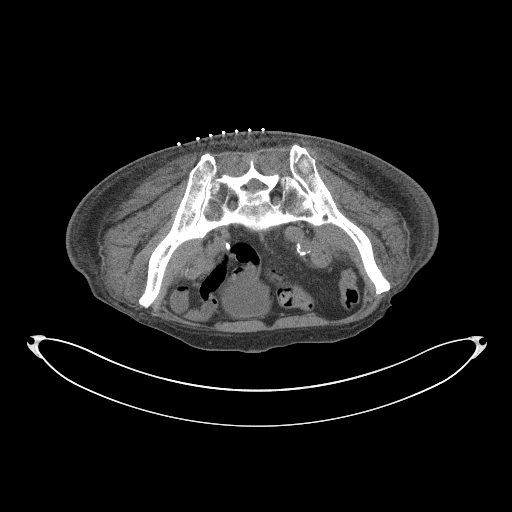
[im 15/37  soft-tissue]
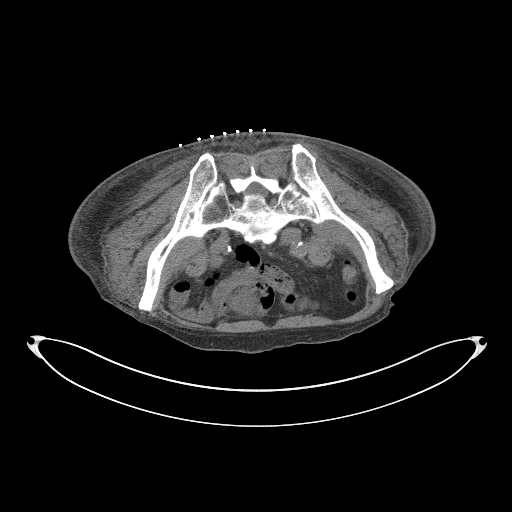
[im 19/37  soft-tissue]
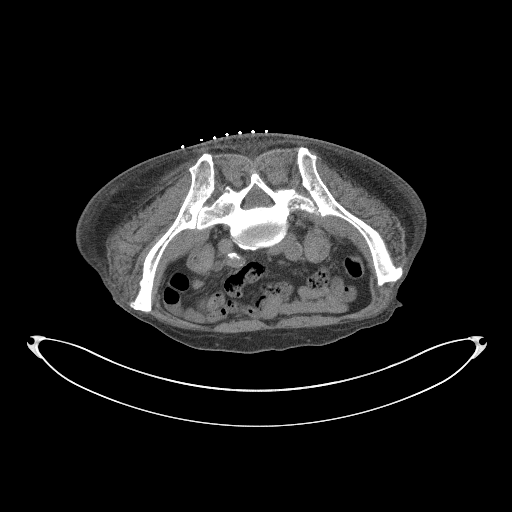
[im 22/37  soft-tissue]
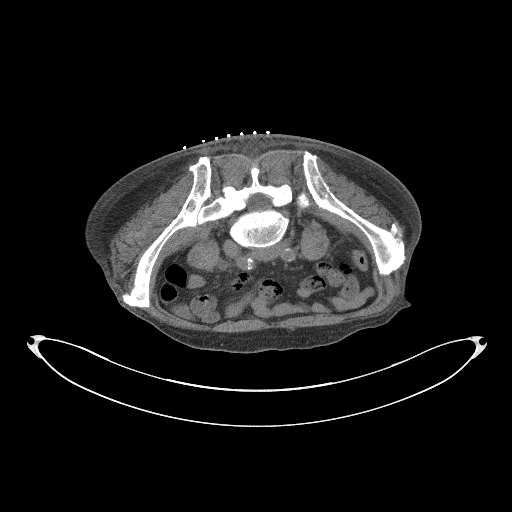
[im 24/37  soft-tissue]
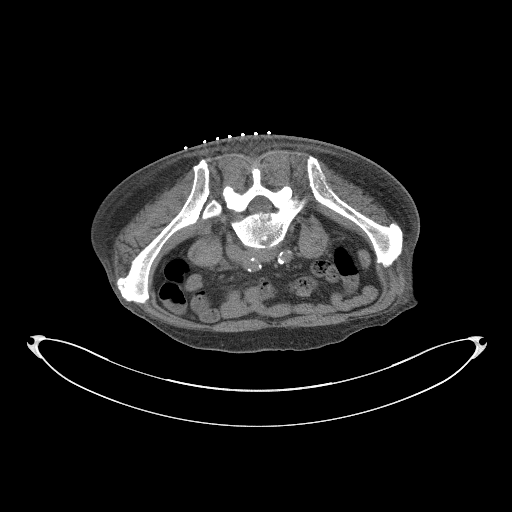
[im 24/37  bone]
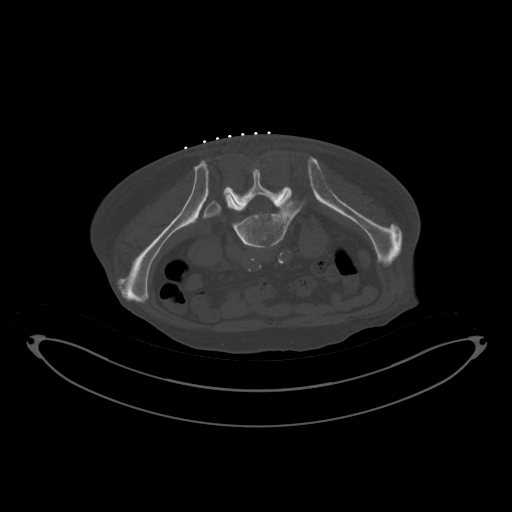
[im 27/37  soft-tissue]
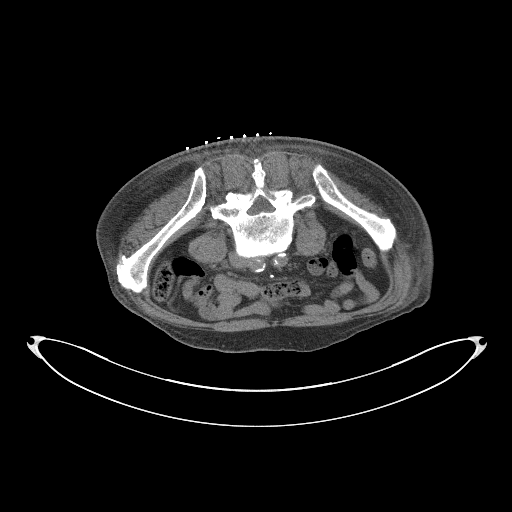
[im 29/37  soft-tissue]
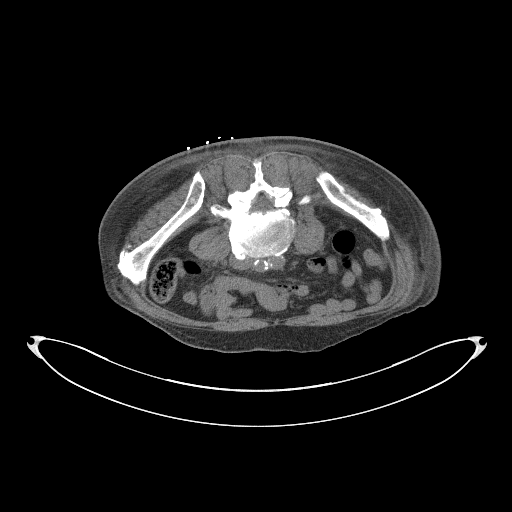
[im 32/37  soft-tissue]
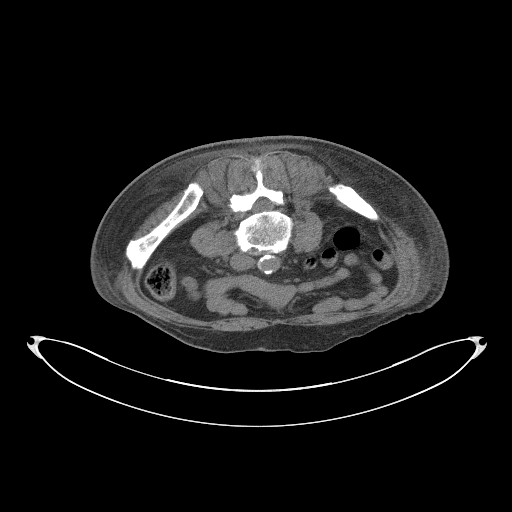
[im 32/37  lung]
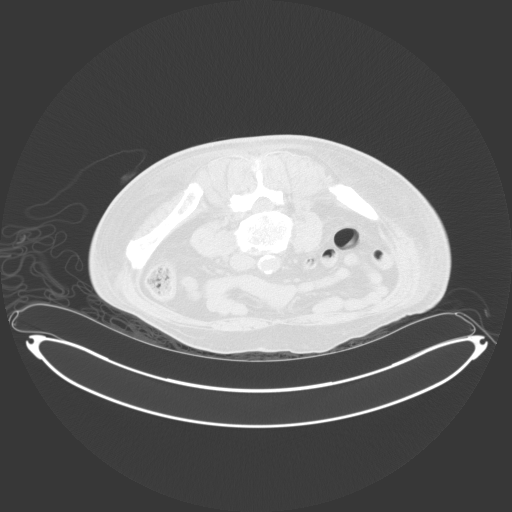
[im 33/37  lung]
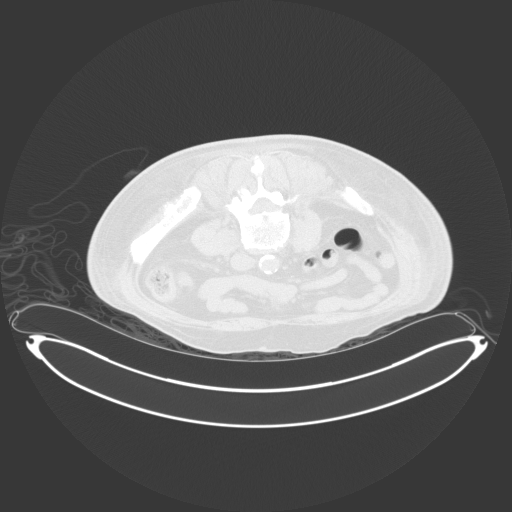
[im 34/37  soft-tissue]
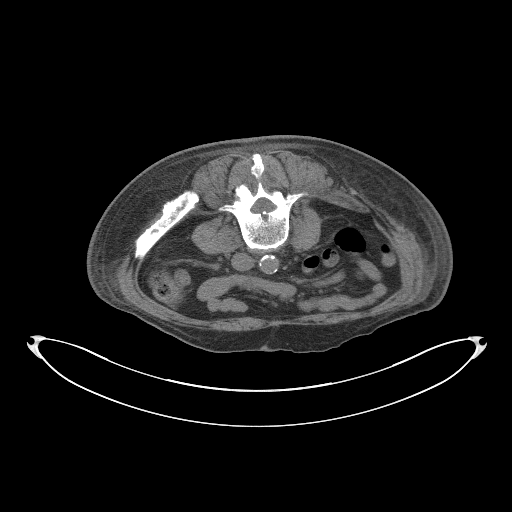
[im 34/37  lung]
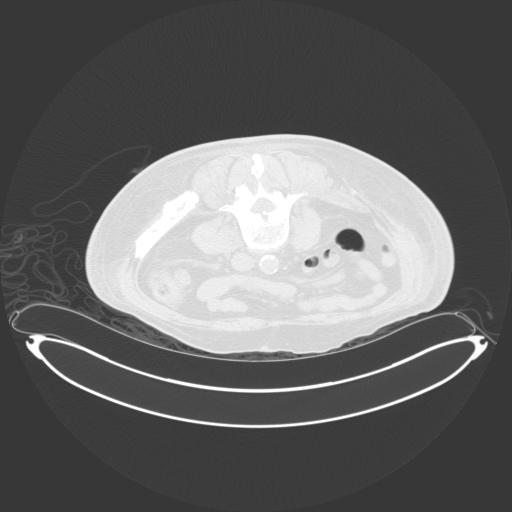
[im 35/37  lung]
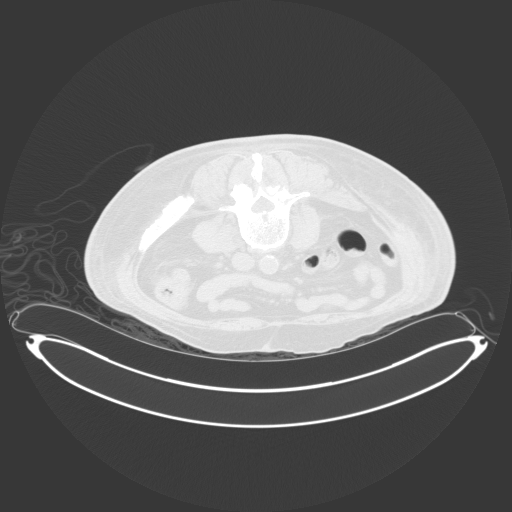

[15 of 32 positions shown; findings below may reference images not displayed]

EXAM:
CT GUIDED BONE MARROW ASPIRATION AND BIOPSY

ANESTHESIA/SEDATION:
Versed 2.0 mg IV, Fentanyl 100 mcg IV

Total Moderate Sedation Time:   12 minutes.

The patient's level of consciousness and physiologic status were
continuously monitored during the procedure by Radiology nursing.

PROCEDURE:
The procedure risks, benefits, and alternatives were explained to
the patient. Questions regarding the procedure were encouraged and
answered. The patient understands and consents to the procedure. A
time out was performed prior to initiating the procedure.

The right gluteal region was prepped with chlorhexidine. Sterile
gown and sterile gloves were used for the procedure. Local
anesthesia was provided with 1% Lidocaine.

Under CT guidance, an 11 gauge On Control bone cutting needle was
advanced from a posterior approach into the right iliac bone. Needle
positioning was confirmed with CT. Initial non heparinized and
heparinized aspirate samples were obtained of bone marrow. Core
biopsy was performed via the On Control drill needle.

COMPLICATIONS:
None
FINDINGS: Inspection of initial aspirate did reveal visible particles. Intact
core biopsy sample was obtained.
IMPRESSION: CT guided bone marrow biopsy of right posterior iliac bone with both
aspirate and core samples obtained.

## 2021-05-17 MED ORDER — FENTANYL CITRATE (PF) 100 MCG/2ML IJ SOLN
INTRAMUSCULAR | Status: AC
  Filled 2021-05-17: qty 2

## 2021-05-17 MED ORDER — MIDAZOLAM HCL 2 MG/2ML IJ SOLN
INTRAMUSCULAR | Status: AC | PRN
  Administered 2021-05-17 (×2): 1 mg via INTRAVENOUS

## 2021-05-17 MED ORDER — SODIUM CHLORIDE 0.9 % IV SOLN: INTRAVENOUS | Status: DC

## 2021-05-17 MED ORDER — MIDAZOLAM HCL 2 MG/2ML IJ SOLN
INTRAMUSCULAR | Status: AC
  Filled 2021-05-17: qty 4

## 2021-05-17 MED ORDER — LIDOCAINE HCL (PF) 1 % IJ SOLN
INTRAMUSCULAR | Status: AC | PRN
  Administered 2021-05-17: 10 mL

## 2021-05-17 MED ORDER — FENTANYL CITRATE (PF) 100 MCG/2ML IJ SOLN
INTRAMUSCULAR | Status: AC | PRN
  Administered 2021-05-17 (×2): 50 ug via INTRAVENOUS

## 2021-05-17 NOTE — Discharge Instructions (Signed)

## 2021-05-17 NOTE — H&P (Signed)
Chief Complaint: Patient was seen in consultation today for bone marrow biopsy at the request of Ennever,Bruce Smith  Referring Physician(s): Ennever,Bruce Smith  Supervising Physician: Aletta Edouard  Patient Status: Bruce Smith  History of Present Illness: Bruce Smith is a 70 y.o. male with hx of myeloma. He has underwent stem cell transplant and is here for follow up bone marrow biopsy. Last had BM bx in 09/2020. PMHx, meds, labs, imaging, allergies reviewed. Feels well, no recent fevers, chills, illness. Has been NPO today as directed.   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   Multiple myeloma ([place]) 07/21/2020   Myocardial infarction [facility])     Past Surgical History:  Procedure Laterality Date   ANTERIOR CERVICAL DECOMP/DISCECTOMY FUSION N/A 06/17/2020   Procedure: ANTERIOR CERVICAL DECOMPRESSION/DISCECTOMY FUSION, INTERBODY PROSTHESIS, PLATE/SCREWS CERVICAL FIVE- CERVICAL SIX;  Surgeon: Newman Pies, MD;  Location: [place];  Service: Neurosurgery;  Laterality: N/A;  ANTERIOR CERVICAL DECOMPRESSION/DISCECTOMY FUSION, INTERBODY PROSTHESIS, PLATE/SCREWS CERVICAL FIVE- CERVICAL SIX   BIOPSY  02/24/2021   Procedure: BIOPSY;  Surgeon: Otis Brace, MD;  Location: [facility];  Service: Gastroenterology;;   CARDIAC CATHETERIZATION     ESOPHAGOGASTRODUODENOSCOPY N/A 02/24/2021   Procedure: ESOPHAGOGASTRODUODENOSCOPY (EGD);  Surgeon: Otis Brace, MD;  Location: [facility];  Service: Gastroenterology;  Laterality: N/A;   FEMUR IM NAIL Left 07/14/2020   Procedure: OPEN REDUCTION INTERNAL FIXATION (ORIF LEFT FEMORAL NAIL FRACTURE;  Surgeon: Marchia Bond, MD;  Location: [place];  Service: Orthopedics;  Laterality: Left;     Allergies: Patient has no known allergies.  Medications: Prior to Admission medications   Medication Sig Start Date End Date Taking? Authorizing Provider  acetaminophen (TYLENOL) 500 MG tablet Take 1 tablet (500 mg total) by mouth every 6 (six) hours. 03/17/21   Love, Ivan Anchors, PA-C  acyclovir (ZOVIRAX) 400 MG tablet Take 400 mg by mouth 2 (two) times daily. 02/10/21   [provider]  atorvastatin (LIPITOR) 40 MG tablet Take 0.5 tablets by mouth daily. 03/26/21   [provider]  calcium carbonate (OS-CAL - DOSED IN MG OF ELEMENTAL CALCIUM) 1250 (500 Ca) MG tablet Take 1 tablet (500 mg of elemental calcium total) by mouth 2 (two) times daily with a meal. 03/17/21   Love, Ivan Anchors, PA-C  celecoxib (CELEBREX) 100 MG capsule TAKE 1 CAPSULE BY MOUTH TWICE A DAY WIOTH FOOD FOR PAIN/INFLAMMATION 04/13/21   Lovorn, Jinny Blossom, MD  doxycycline (VIBRA-TABS) 100 MG tablet Take 100 mg by mouth 2 (two) times daily. 04/22/21   [provider]  levETIRAcetam (KEPPRA) 500 MG tablet TAKE 1 TABLET BY MOUTH 2 TIMES A DAY 04/13/21   Lovorn, Jinny Blossom, MD  lisinopril (ZESTRIL) 5 MG tablet Take 7.5 mg by mouth daily. 03/26/21   [provider]  loperamide (IMODIUM) 2 MG capsule Take 1 capsule (2 mg total) by mouth as needed for diarrhea or loose stools. 03/17/21   Love, Ivan Anchors, PA-C  mupirocin ointment (BACTROBAN) 2 % SMARTSIG:Topical Every 12 Hours 04/22/21   [provider]  ondansetron (ZOFRAN) 8 MG tablet TAKE 1 TABLET BY MOUTH EVERY 8 HOURS AS NEEDED NAUSEA AND VOMITING 03/31/21   Volanda Napoleon, MD  pantoprazole (PROTONIX) 40 MG tablet Take 1 tablet (40 mg total) by mouth 2 (two)  times daily before a meal. 03/10/21   Ennever, Rudell Cobb, MD  pantoprazole (PROTONIX) 40 MG tablet Take 40 mg by mouth daily. 03/10/21   [provider]  polycarbophil (FIBERCON) 625 MG tablet Take 1 tablet (625 mg total) by mouth daily. 03/18/21   Love, Ivan Anchors, PA-C  pregabalin (LYRICA) 100 MG  capsule Take 1 capsule (100 mg total) by mouth daily. 03/17/21   Love, Ivan Anchors, PA-C  pregabalin (LYRICA) 150 MG capsule Take 1 capsule (150 mg total) by mouth at bedtime. 03/17/21   Love, Ivan Anchors, PA-C  saccharomyces boulardii (FLORASTOR) 250 MG capsule Take 1 capsule (250 mg total) by mouth 2 (two) times daily. 03/17/21   Love, Ivan Anchors, PA-C  saccharomyces boulardii (FLORASTOR) 250 MG capsule Take 1 capsule (250 mg total) by mouth 2 (two) times daily. 04/29/21   Volanda Napoleon, MD  tamsulosin (FLOMAX) 0.4 MG CAPS capsule Take 0.4 mg by mouth at bedtime. 03/24/21   [provider]  traMADol (ULTRAM) 50 MG tablet Take 50 mg by mouth. 03/17/21   [provider]  traMADol (ULTRAM) 50 MG tablet Take 1 tablet (50 mg total) by mouth every 6 (six) hours. 05/01/21   Volanda Napoleon, MD     Family History  Problem Relation Age of Onset   Stroke Mother    Prostate cancer Paternal Grandfather     Social History   Socioeconomic History   Marital status: Married    Spouse name: Not on file   Number of children: Not on file   Years of education: Not on file   Highest education level: Not on file  Occupational History   Not on file  Tobacco Use   Smoking status: Former   Smokeless tobacco: Current    Types: Chew  Vaping Use   Vaping Use: Never used  Substance and Sexual Activity   Alcohol use: Not Currently    Comment: rare   Drug use: Never   Sexual activity: Not Currently  Other Topics Concern   Not on file  Social History Narrative   Not on file   Social Determinants of Health   Financial Resource Strain: Not on file  Food Insecurity: Not on file  Transportation Needs: Not on file  Physical Activity: Not on file  Stress: Not on file  Social Connections: Not on file    Review of Systems: A 12 point ROS discussed and pertinent positives are indicated in the HPI above.  All other systems are negative.  Review of Systems  Vital Signs: BP (!) 152/79   Pulse 79    Temp 97.8 F (36.6 C) (Oral)   Resp (!) 148   SpO2 100%   Physical Exam Constitutional:      Appearance: Normal appearance.  HENT:     Mouth/Throat:     Mouth: Mucous membranes are moist.     Pharynx: Oropharynx is clear.  Cardiovascular:     Rate and Rhythm: Normal rate and regular rhythm.     Heart sounds: Normal heart sounds.  Pulmonary:     Effort: Pulmonary effort is normal. No respiratory distress.     Breath sounds: Normal breath sounds.  Skin:    General: Skin is warm and dry.  Neurological:     General: No focal deficit present.     Mental Status: He is alert and oriented to person, place, and time.  Psychiatric:        Mood and Affect: Mood normal.  Thought Content: Thought content normal.        Judgment: Judgment normal.     Imaging: No results found.  Labs:  CBC: Recent Labs    03/15/21 0711 03/30/21 1339 04/29/21 0756 05/13/21 0901  WBC 4.2 6.0 5.9 7.1  HGB 11.1* 11.6* 11.6* 11.7*  HCT 36.4* 35.1* 35.1* 36.1*  PLT 226 228 192 197    COAGS: Recent Labs    07/09/20 0725 10/19/20 0800  INR 1.1 1.1    BMP: Recent Labs    06/26/20 1638 06/29/20 1453 07/09/20 0725 07/21/20 1256 08/11/20 0822 03/15/21 0659 03/30/21 1339 04/29/21 0756 05/13/21 0901  NA 137 137 137 137   < > 137 137 139 143  K 4.4 4.5 3.6 4.3   < > 3.5 4.2 3.8 3.9  CL 102 99 108 102   < > 110 104 106 107  CO2 '26 31 23 26   ' < > '24 27 27 30  ' GLUCOSE 131* 124* 116* 107*   < > 92 116* 78 99  BUN '19 22 14 12   ' < > <5* 9 6* 11  CALCIUM 11.1* 12.8* 8.1* 9.0   < > 7.2* 8.2* 8.6* 8.7*  CREATININE 1.19 1.61* 0.96 1.04   < > 0.47* 0.56* 0.57* 0.63  GFRNONAA >60 43* >60 >60   < > >60 >60 >60 >60  GFRAA >60 50* >60 >60  --   --   --   --   --    < > = values in this interval not displayed.    LIVER FUNCTION TESTS: Recent Labs    03/11/21 0519 03/30/21 1339 04/29/21 0756 05/13/21 0901  BILITOT 0.5 0.3 0.4 0.3  AST 16 11* 16 17  ALT '15 8 11 17  ' ALKPHOS 82 93 88  110  PROT 3.7* 4.3* 4.2* 4.5*  ALBUMIN 2.1* 2.9* 2.9* 3.2*    TUMOR MARKERS: No results for input(s): AFPTM, CEA, CA199, CHROMGRNA in the last 8760 hours.  Assessment and Plan: Hx of myeloma s/p stem cell transplant For image guided bone marrow biopsy Labs reviewed. Risks and benefits of Bone Marrow bx was discussed with the patient and/or patient's family including, but not limited to bleeding, infection, damage to adjacent structures or low yield requiring additional tests.  All of the questions were answered and there is agreement to proceed.  Consent signed and in chart.   Thank you for this interesting consult.  I greatly enjoyed meeting Ardith Lewman and look forward to participating in their care.  A copy of this report was sent to the requesting provider on this date.  Electronically Signed: Ascencion Dike, PA-C 05/17/2021, 9:53 AM   I spent a total of 20 minutes in face to face in clinical consultation, greater than 50% of which was counseling/coordinating care for bone marrow bx

## 2021-05-17 NOTE — Procedures (Signed)
Interventional Radiology Procedure Note  Procedure: CT guided bone marrow aspiration and biopsy  Complications: None  EBL: < 10 mL  Findings: Aspirate and core biopsy performed of bone marrow in right iliac bone.  Plan: Bedrest supine x 1 hrs  Arush Gatliff T. Rolan Wrightsman, M.D Pager:  [phone]   

## 2021-05-18 ENCOUNTER — Other Ambulatory Visit: Payer: Self-pay | Admitting: Physical Medicine and Rehabilitation

## 2021-05-18 ENCOUNTER — Other Ambulatory Visit: Payer: Self-pay | Admitting: Hematology & Oncology

## 2021-05-19 ENCOUNTER — Other Ambulatory Visit: Payer: Self-pay | Admitting: Hematology & Oncology

## 2021-05-25 ENCOUNTER — Encounter (HOSPITAL_COMMUNITY): Payer: Self-pay | Admitting: Hematology & Oncology

## 2021-05-28 ENCOUNTER — Encounter (HOSPITAL_COMMUNITY): Payer: Self-pay | Admitting: Hematology & Oncology

## 2021-05-31 ENCOUNTER — Telehealth: Payer: Self-pay | Admitting: *Deleted

## 2021-05-31 ENCOUNTER — Other Ambulatory Visit: Payer: Self-pay | Admitting: Hematology & Oncology

## 2021-05-31 ENCOUNTER — Other Ambulatory Visit: Payer: Self-pay | Admitting: Physical Medicine and Rehabilitation

## 2021-05-31 NOTE — Telephone Encounter (Signed)
As noted below by Dr. [name], I left a message informing the patient that the bone marrow looks normal with NO residual myeloma! Instructed him to call the office if he has any questions or concerns.

## 2021-05-31 NOTE — Telephone Encounter (Signed)
-----   Message from Volanda Napoleon, MD sent at 05/28/2021  3:03 PM EDT ----- Call - the bone marrow looks normal with NO residual myeloma!!  This  is outstanding!!  Laurey Arrow

## 2021-06-11 ENCOUNTER — Inpatient Hospital Stay (HOSPITAL_BASED_OUTPATIENT_CLINIC_OR_DEPARTMENT_OTHER): Payer: BC Managed Care – PPO | Admitting: Hematology & Oncology

## 2021-06-11 ENCOUNTER — Other Ambulatory Visit: Payer: Self-pay | Admitting: *Deleted

## 2021-06-11 ENCOUNTER — Encounter: Payer: Self-pay | Admitting: *Deleted

## 2021-06-11 ENCOUNTER — Inpatient Hospital Stay: Payer: BC Managed Care – PPO | Attending: Hematology & Oncology

## 2021-06-11 ENCOUNTER — Encounter: Payer: Self-pay | Admitting: Hematology & Oncology

## 2021-06-11 ENCOUNTER — Other Ambulatory Visit: Payer: Self-pay | Admitting: Hematology & Oncology

## 2021-06-11 ENCOUNTER — Inpatient Hospital Stay: Payer: BC Managed Care – PPO

## 2021-06-11 ENCOUNTER — Other Ambulatory Visit: Payer: Self-pay

## 2021-06-11 VITALS — BP 128/60 | HR 72 | Temp 98.6°F | Resp 16 | Wt 161.0 lb

## 2021-06-11 DIAGNOSIS — C9001 Multiple myeloma in remission: Secondary | ICD-10-CM | POA: Insufficient documentation

## 2021-06-11 DIAGNOSIS — Z9484 Stem cells transplant status: Secondary | ICD-10-CM | POA: Insufficient documentation

## 2021-06-11 DIAGNOSIS — Z923 Personal history of irradiation: Secondary | ICD-10-CM | POA: Insufficient documentation

## 2021-06-11 DIAGNOSIS — Z79899 Other long term (current) drug therapy: Secondary | ICD-10-CM | POA: Insufficient documentation

## 2021-06-11 DIAGNOSIS — Z5112 Encounter for antineoplastic immunotherapy: Secondary | ICD-10-CM | POA: Diagnosis present

## 2021-06-11 DIAGNOSIS — C9000 Multiple myeloma not having achieved remission: Secondary | ICD-10-CM

## 2021-06-11 DIAGNOSIS — C419 Malignant neoplasm of bone and articular cartilage, unspecified: Secondary | ICD-10-CM | POA: Diagnosis not present

## 2021-06-11 DIAGNOSIS — E440 Moderate protein-calorie malnutrition: Secondary | ICD-10-CM

## 2021-06-11 LAB — CBC WITH DIFFERENTIAL (CANCER CENTER ONLY)
Abs Immature Granulocytes: 0.02 10*3/uL (ref 0.00–0.07)
Basophils Absolute: 0 10*3/uL (ref 0.0–0.1)
Basophils Relative: 0 %
Eosinophils Absolute: 0.3 10*3/uL (ref 0.0–0.5)
Eosinophils Relative: 5 %
HCT: 34.7 % — ABNORMAL LOW (ref 39.0–52.0)
Hemoglobin: 11.2 g/dL — ABNORMAL LOW (ref 13.0–17.0)
Immature Granulocytes: 0 %
Lymphocytes Relative: 13 %
Lymphs Abs: 0.8 10*3/uL (ref 0.7–4.0)
MCH: 30.1 pg (ref 26.0–34.0)
MCHC: 32.3 g/dL (ref 30.0–36.0)
MCV: 93.3 fL (ref 80.0–100.0)
Monocytes Absolute: 0.7 10*3/uL (ref 0.1–1.0)
Monocytes Relative: 11 %
Neutro Abs: 4.4 10*3/uL (ref 1.7–7.7)
Neutrophils Relative %: 71 %
Platelet Count: 202 10*3/uL (ref 150–400)
RBC: 3.72 MIL/uL — ABNORMAL LOW (ref 4.22–5.81)
RDW: 14.2 % (ref 11.5–15.5)
WBC Count: 6.2 10*3/uL (ref 4.0–10.5)
nRBC: 0 % (ref 0.0–0.2)

## 2021-06-11 LAB — CMP (CANCER CENTER ONLY)
ALT: 20 U/L (ref 0–44)
AST: 20 U/L (ref 15–41)
Albumin: 3.4 g/dL — ABNORMAL LOW (ref 3.5–5.0)
Alkaline Phosphatase: 136 U/L — ABNORMAL HIGH (ref 38–126)
Anion gap: 6 (ref 5–15)
BUN: 14 mg/dL (ref 8–23)
CO2: 29 mmol/L (ref 22–32)
Calcium: 8.8 mg/dL — ABNORMAL LOW (ref 8.9–10.3)
Chloride: 107 mmol/L (ref 98–111)
Creatinine: 0.63 mg/dL (ref 0.61–1.24)
GFR, Estimated: >60 mL/min (ref >60)
Glucose, Bld: 95 mg/dL (ref 70–99)
Potassium: 4.1 mmol/L (ref 3.5–5.1)
Sodium: 142 mmol/L (ref 135–145)
Total Bilirubin: 0.3 mg/dL (ref 0.3–1.2)
Total Protein: 4.7 g/dL — ABNORMAL LOW (ref 6.5–8.1)

## 2021-06-11 LAB — MAGNESIUM: Magnesium: 1.8 mg/dL (ref 1.7–2.4)

## 2021-06-11 LAB — LACTATE DEHYDROGENASE: LDH: 125 U/L (ref 98–192)

## 2021-06-11 MED ORDER — DRONABINOL 2.5 MG PO CAPS: ORAL_CAPSULE | ORAL | 2 refills | Status: DC

## 2021-06-11 MED ORDER — DARATUMUMAB-HYALURONIDASE-FIHJ 1800-30000 MG-UT/15ML ~~LOC~~ SOLN
1800.0000 mg | Freq: Once | SUBCUTANEOUS | Status: AC
  Administered 2021-06-11: 1800 mg via SUBCUTANEOUS
  Filled 2021-06-11: qty 15

## 2021-06-11 MED ORDER — ROPINIROLE HCL 2 MG PO TABS: 2.0000 mg | ORAL_TABLET | Freq: Every day | ORAL | 4 refills | Status: DC

## 2021-06-11 NOTE — Progress Notes (Signed)
Hematology and Oncology Follow Up Visit  Bruce Smith [id number] [birth year] 70 y.o. 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  The big news is that we did a bone marrow biopsy on him.  This is done on 05/17/2021.  The pathology report ([id number]) showed a normocellular marrow with no evidence of plasma cell neoplasm.  I am incredibly impressed by this.  Flow cytometry did not even show any plasma cells.  The FISH panel did not show any molecular abnormalities.  I would have to say that he is in remission.  He has minimal residual disease.  As such, I would think that he should do okay with respect to maintenance therapy.  His legs still bother him.  Lyrica seems to work for him.  I will see about Requip.  He has had no problems with his appetite.  He has had little bit of nausea but Marinol seems to help this.  He has had no change in bowel or bladder habits.  Is been no issues with fever.  He has had no cough.  We have not been able to give him any Xgeva just because of his calcium.  His calcium is coming up slowly.  Overall, I would say his performance status is probably ECOG 1.    Medications:  Allergies as of 06/11/2021   No Known Allergies      Medication List        Accurate as of June 11, 2021 10:22 AM. If you have any questions, ask your nurse or doctor.          STOP taking these  medications    atorvastatin 40 MG tablet Commonly known as: LIPITOR Stopped by: Volanda Napoleon, MD   doxycycline 100 MG tablet Commonly known as: VIBRA-TABS Stopped by: Volanda Napoleon, MD   lisinopril 5 MG tablet Commonly known as: ZESTRIL Stopped by: Volanda Napoleon, MD   mupirocin ointment 2 % Commonly known as: BACTROBAN Stopped by: Volanda Napoleon, MD   polycarbophil 625 MG tablet Commonly known as: FIBERCON Stopped by: Volanda Napoleon, MD       TAKE these medications    acetaminophen 500 MG tablet Commonly known as: TYLENOL Take 1 tablet (500 mg total) by mouth every 6 (six) hours.   acyclovir 400 MG tablet Commonly known as: ZOVIRAX Take 400 mg by mouth 2 (two) times daily.   calcium carbonate 1250 (500 Ca) MG tablet Commonly known as: OS-CAL - dosed in mg of elemental calcium Take 1 tablet (500 mg of elemental calcium total) by mouth 2 (two) times daily with a meal.   celecoxib 100 MG capsule Commonly known as: CELEBREX TAKE 1 CAPSULE BY MOUTH TWICE A DAY WIOTH FOOD FOR PAIN/INFLAMMATION   dronabinol 5 MG capsule Commonly known as: MARINOL TAKE 1 CAPSULE BY MOUTH TWICE DAILY BEFORE A MEAL Started  by: Volanda Napoleon, MD   Florastor 250 MG capsule Generic drug: saccharomyces boulardii Take 250 mg by mouth 2 (two) times daily. What changed: Another medication with the same name was removed. Continue taking this medication, and follow the directions you see here. Changed by: Volanda Napoleon, MD   levETIRAcetam 500 MG tablet Commonly known as: KEPPRA TAKE 1 TABLET BY MOUTH 2 TIMES A DAY   loperamide 2 MG capsule Commonly known as: IMODIUM Take 1 capsule (2 mg total) by mouth as needed for diarrhea or loose stools.   MIRALAX PO Take by mouth daily as needed.   MULTIVITAMIN MEN 50+ PO Take 1 tablet by mouth daily.   ondansetron 8 MG tablet Commonly known as: ZOFRAN TAKE 1 TABLET BY MOUTH EVERY 8 HOURS AS NEEDED NAUSEA AND VOMITING    pantoprazole 40 MG tablet Commonly known as: PROTONIX TAKE 1 TABLET BY MOUTH 2 TIMES A DAY BEFORE A MEAL What changed:  See the new instructions. Another medication with the same name was removed. Continue taking this medication, and follow the directions you see here. Changed by: Volanda Napoleon, MD   pregabalin 150 MG capsule Commonly known as: LYRICA TAKE ONE CAPSULE BY MOUTH AT BEDTIME   pregabalin 100 MG capsule Commonly known as: LYRICA TAKE 1 CAPSULE BY MOUTH EVERY MORNING   senna-docusate 8.6-50 MG tablet Commonly known as: Senokot-S Take by mouth as needed.   tamsulosin 0.4 MG Caps capsule Commonly known as: FLOMAX Take 0.4 mg by mouth at bedtime.   traMADol 50 MG tablet Commonly known as: ULTRAM TAKE 1 TABLET BY MOUTH EVERY 6 HOURS What changed: Another medication with the same name was removed. Continue taking this medication, and follow the directions you see here. Changed by: Volanda Napoleon, MD        Allergies: No Known Allergies  Past Medical History, Surgical history, Social history, and Family History were reviewed and updated.  Review of Systems: Review of Systems  Constitutional: Negative.   HENT: Negative.    Eyes: Negative.   Respiratory: Negative.    Cardiovascular: Negative.   Gastrointestinal: Negative.   Genitourinary: Negative.   Musculoskeletal:  Positive for joint pain.  Skin: Negative.   Neurological: Negative.   Endo/Heme/Allergies: Negative.   Psychiatric/Behavioral: Negative.      Physical Exam:  weight is 161 lb (73 kg). His oral temperature is 98.6 F (37 C). His blood pressure is 128/60 and his pulse is 72. His respiration is 16 and oxygen saturation is 98%.   Wt Readings from Last 3 Encounters:  06/11/21 161 lb (73 kg)  04/29/21 156 lb (70.8 kg)  03/30/21 145 lb (65.8 kg)    Physical Exam Vitals reviewed.  HENT:     Head: Normocephalic and atraumatic.  Eyes:     Pupils: Pupils are equal, round, and reactive to  light.  Cardiovascular:     Rate and Rhythm: Normal rate and regular rhythm.     Heart sounds: Normal heart sounds.  Pulmonary:     Effort: Pulmonary effort is normal.     Breath sounds: Normal breath sounds.  Abdominal:     General: Bowel sounds are normal.     Palpations: Abdomen is soft.  Musculoskeletal:        General: No tenderness or deformity. Normal range of motion.     Cervical back: Normal range of motion.  Lymphadenopathy:     Cervical: No cervical adenopathy.  Skin:    General: Skin is  warm and dry.     Findings: No erythema or rash.  Neurological:     Mental Status: He is alert and oriented to person, place, and time.  Psychiatric:        Behavior: Behavior normal.        Thought Content: Thought content normal.        Judgment: Judgment normal.     Lab Results  Component Value Date   WBC 6.2 06/11/2021   HGB 11.2 (L) 06/11/2021   HCT 34.7 (L) 06/11/2021   MCV 93.3 06/11/2021   PLT 202 06/11/2021   Lab Results  Component Value Date   FERRITIN 938 (H) 02/23/2021   IRON 67 02/23/2021   TIBC 156 (L) 02/23/2021   UIBC 90 (L) 02/23/2021   IRONPCTSAT 43 02/23/2021   Lab Results  Component Value Date   RBC 3.72 (L) 06/11/2021   Lab Results  Component Value Date   KPAFRELGTCHN 3.9 04/29/2021   LAMBDASER 2.6 (L) 04/29/2021   KAPLAMBRATIO 1.50 04/29/2021   Lab Results  Component Value Date   IGGSERUM 258 (L) 04/29/2021   IGA 23 (L) 04/29/2021   IGMSERUM 11 (L) 04/29/2021   Lab Results  Component Value Date   TOTALPROTELP 4.0 (L) 04/29/2021   ALBUMINELP 2.5 (L) 04/29/2021   A1GS 0.2 04/29/2021   A2GS 0.6 04/29/2021   BETS 0.5 (L) 04/29/2021   GAMS 0.2 (L) 04/29/2021   MSPIKE 0.1 (H) 04/29/2021     Chemistry      Component Value Date/Time   NA 142 06/11/2021 0934   K 4.1 06/11/2021 0934   CL 107 06/11/2021 0934   CO2 29 06/11/2021 0934   BUN 14 06/11/2021 0934   CREATININE 0.63 06/11/2021 0934      Component Value Date/Time   CALCIUM  8.8 (L) 06/11/2021 0934   ALKPHOS 136 (H) 06/11/2021 0934   AST 20 06/11/2021 0934   ALT 20 06/11/2021 0934   BILITOT 0.3 06/11/2021 0934       Impression and Plan: Bruce Smith is a very pleasant 70 yo gentleman with kappa light chain myeloma with recent stem cell transplant with [facility] on 01/22/2021.   Hopefully, he will stay in remission for quite a while.  He really had a miserable time with treatment.  He responded incredibly well however.  We will continue him on the Faspro.  Again we will I cannot give any Xgeva or other bone hardener because of the calcium.  I would like to see his calcium keep getting better.  Maybe, the Requip will help.  We will can plan to get him back in another month.  He is incredibly tough.  He has such a good family that supports him.  He is meant to stay here and to help and to be an inspiration to many people.   Volanda Napoleon, MD 8/12/202210:22 AM

## 2021-06-11 NOTE — Progress Notes (Signed)
Oncology Nurse Navigator Documentation  Oncology Nurse Navigator Flowsheets 06/11/2021  Abnormal Finding Date -  Confirmed Diagnosis Date -  Diagnosis Status -  Planned Course of Treatment -  Phase of Treatment -  Chemotherapy Actual Start Date: -  Radiation Actual Start Date: -  Radiation Expected End Date: -  Radiation Actual End Date: -  Navigator Follow Up Date: -  Navigator Follow Up Reason: -  Navigation Complete Date: 06/11/2021  Post Navigation: Continue to Follow Patient? No  Reason Not Navigating Patient: Patient On Maintenance Chemotherapy  Navigator Location [facility]  Navigator Encounter Type Appt/Treatment Plan Review  Telephone -  Patient Visit Type MedOnc  Treatment Phase Post-Tx Follow-up  Barriers/Navigation Needs -  Education -  Interventions -  Acuity -  Referrals -  Coordination of Care -  Education Method -  Support Groups/Services Friends and Family  Time Spent with Patient 15

## 2021-06-11 NOTE — Patient Instructions (Signed)
[facility]  Discharge Instructions: Thank you for choosing [facility] to provide your oncology and hematology care.   If you have a lab appointment with the [facility], please go directly to the [facility] and check in at the registration area.  Wear comfortable clothing and clothing appropriate for easy access to any Portacath or PICC line.   We strive to give you quality time with your provider. You may need to reschedule your appointment if you arrive late (15 or more minutes).  Arriving late affects you and other patients whose appointments are after yours.  Also, if you miss three or more appointments without notifying the office, you may be dismissed from the clinic at the provider's discretion.      For prescription refill requests, have your pharmacy contact our office and allow 72 hours for refills to be completed.    Today you received the following chemotherapy and/or immunotherapy agents daratumamab     To help prevent nausea and vomiting after your treatment, we encourage you to take your nausea medication as directed.  BELOW ARE SYMPTOMS THAT SHOULD BE REPORTED IMMEDIATELY: *FEVER GREATER THAN 100.4 F (38 C) OR HIGHER *CHILLS OR SWEATING *NAUSEA AND VOMITING THAT IS NOT CONTROLLED WITH YOUR NAUSEA MEDICATION *UNUSUAL SHORTNESS OF BREATH *UNUSUAL BRUISING OR BLEEDING *URINARY PROBLEMS (pain or burning when urinating, or frequent urination) *BOWEL PROBLEMS (unusual diarrhea, constipation, pain near the anus) TENDERNESS IN MOUTH AND THROAT WITH OR WITHOUT PRESENCE OF ULCERS (sore throat, sores in mouth, or a toothache) UNUSUAL RASH, SWELLING OR PAIN  UNUSUAL VAGINAL DISCHARGE OR ITCHING   Items with * indicate a potential emergency and should be followed up as soon as possible or go to the Emergency Department if any problems should occur.  Please show the CHEMOTHERAPY ALERT CARD or IMMUNOTHERAPY ALERT CARD at check-in to the  Emergency Department and triage nurse. Should you have questions after your visit or need to cancel or reschedule your appointment, please contact [facility]  [phone] and follow the prompts.  Office hours are 8:00 a.m. to 4:30 p.m. Monday - Friday. Please note that voicemails left after 4:00 p.m. may not be returned until the following business day.  We are closed weekends and major holidays. You have access to a nurse at all times for urgent questions. Please call the main number to the clinic [phone] and follow the prompts.  For any non-urgent questions, you may also contact your provider using MyChart. We now offer e-Visits for anyone 70 and older to request care online for non-urgent symptoms. For details visit [email or web address].   Also download the MyChart app! Go to the app store, search "MyChart", open the app, select [facility], and log in with your MyChart username and password.  Due to Covid, a mask is required upon entering the hospital/clinic. If you do not have a mask, one will be given to you upon arrival. For doctor visits, patients may have 1 support person aged 70 or older with them. For treatment visits, patients cannot have anyone with them due to current Covid guidelines and our immunocompromised population.

## 2021-06-12 LAB — IGG, IGA, IGM
IgA: 21 mg/dL — ABNORMAL LOW (ref 61–437)
IgG (Immunoglobin G), Serum: 217 mg/dL — ABNORMAL LOW (ref 603–1613)
IgM (Immunoglobulin M), Srm: 8 mg/dL — ABNORMAL LOW (ref 20–172)

## 2021-06-12 LAB — BETA 2 MICROGLOBULIN, SERUM: Beta-2 Microglobulin: 2.2 mg/L (ref 0.6–2.4)

## 2021-06-14 ENCOUNTER — Other Ambulatory Visit: Payer: Self-pay | Admitting: *Deleted

## 2021-06-14 LAB — SURGICAL PATHOLOGY

## 2021-06-14 LAB — KAPPA/LAMBDA LIGHT CHAINS
Kappa free light chain: 4.2 mg/L (ref 3.3–19.4)
Kappa, lambda light chain ratio: 2.21 — ABNORMAL HIGH (ref 0.26–1.65)
Lambda free light chains: 1.9 mg/L — ABNORMAL LOW (ref 5.7–26.3)

## 2021-06-14 MED ORDER — DRONABINOL 5 MG PO CAPS: ORAL_CAPSULE | ORAL | 2 refills | Status: DC

## 2021-06-16 ENCOUNTER — Telehealth: Payer: Self-pay | Admitting: *Deleted

## 2021-06-16 NOTE — Telephone Encounter (Signed)
Message received from patient's wife Bruce Smith stating that pt has a red spot where a tick was removed from patient yesterday and would like to know what to do.  Call placed back to Bruce Smith and Bruce Smith notified that Dr [name] is not in the office today and to contact pt.'s PCP regarding tick bite.  Bruce Smith is appreciative of call back and has no further questions at this time.

## 2021-06-17 ENCOUNTER — Other Ambulatory Visit: Payer: Self-pay | Admitting: Hematology & Oncology

## 2021-06-17 LAB — PROTEIN ELECTROPHORESIS, SERUM, WITH REFLEX
A/G Ratio: 1.6 (ref 0.7–1.7)
Albumin ELP: 2.8 g/dL — ABNORMAL LOW (ref 2.9–4.4)
Alpha-1-Globulin: 0.2 g/dL (ref 0.0–0.4)
Alpha-2-Globulin: 0.7 g/dL (ref 0.4–1.0)
Beta Globulin: 0.6 g/dL — ABNORMAL LOW (ref 0.7–1.3)
Gamma Globulin: 0.2 g/dL — ABNORMAL LOW (ref 0.4–1.8)
Globulin, Total: 1.8 g/dL — ABNORMAL LOW (ref 2.2–3.9)
M-Spike, %: <0.1 g/dL
SPEP Interpretation: 0
Total Protein ELP: 4.6 g/dL — ABNORMAL LOW (ref 6.0–8.5)

## 2021-06-17 LAB — IMMUNOFIXATION REFLEX, SERUM
IgA: 24 mg/dL — ABNORMAL LOW (ref 61–437)
IgG (Immunoglobin G), Serum: 255 mg/dL — ABNORMAL LOW (ref 603–1613)
IgM (Immunoglobulin M), Srm: 7 mg/dL — ABNORMAL LOW (ref 20–172)

## 2021-06-22 ENCOUNTER — Inpatient Hospital Stay: Payer: BC Managed Care – PPO

## 2021-06-22 NOTE — Progress Notes (Signed)
Nutrition Follow-up:  Patient with s/p autologous stem cell transplant with [facility] for multiple myeloma on March 25th, 2022.  Patient then admitted to [facility] on 4/26 with failure to thrive/malnutrition and subsequently [facility] rehab.  Spoke with wife via phone for nutrition follow-up.  Wife reports that patient is "holding his own."  Reports that he is picking more variety of foods recently than before. Still eat at least 3 meals per day or more.  Denies any major nutrition impact symptoms.     Medications: marinol  Labs: reviewed  Anthropometrics:   Weight 161 lb on 8/12  156 lb on 6/30 152 lb at [facility] on 6/6 145 lb on 5/31   NUTRITION DIAGNOSIS: Unintentional weight loss improving   INTERVENTION:  Continue high calorie, high protein foods to promote weight gain Wife has RD contact information    MONITORING, EVALUATION, GOAL: weight trends, intake   NEXT VISIT: phone f/u in ~6 weeks  Candela Krul B. Zenia Resides, [address], [place] Registered Dietitian [phone] (mobile)'

## 2021-06-30 ENCOUNTER — Other Ambulatory Visit: Payer: Self-pay | Admitting: Hematology & Oncology

## 2021-06-30 ENCOUNTER — Other Ambulatory Visit: Payer: Self-pay

## 2021-06-30 MED ORDER — TRAMADOL HCL 50 MG PO TABS: 50.0000 mg | ORAL_TABLET | Freq: Four times a day (QID) | ORAL | 0 refills | Status: DC

## 2021-07-02 ENCOUNTER — Other Ambulatory Visit: Payer: Self-pay

## 2021-07-02 ENCOUNTER — Telehealth: Payer: Self-pay

## 2021-07-02 DIAGNOSIS — C419 Malignant neoplasm of bone and articular cartilage, unspecified: Secondary | ICD-10-CM

## 2021-07-02 NOTE — Progress Notes (Unsigned)
Rx never received from pharmacy, resending via e-scribe

## 2021-07-02 NOTE — Telephone Encounter (Signed)
Patients wife called stating that the pharmacy informed her that the patients tramadol was denied again and is wodnering why ti keeps getting denied. Called and spoke with patients wife and informed her the rx was resent today as it looks to have printed instead of going electronically but it has never been denied by our office per the records. Informed her the way it comes in through the system and maybe there was communication issues between the pharmacy and our electronic system that she can call us or mychart message [place] when he is due for his refills instead of going through the pharmacy to see if that helps the issue. She verbalized understanding and denies any other concerns at this time.

## 2021-07-06 ENCOUNTER — Other Ambulatory Visit: Payer: Self-pay

## 2021-07-06 MED ORDER — TRAMADOL HCL 50 MG PO TABS: 50.0000 mg | ORAL_TABLET | Freq: Four times a day (QID) | ORAL | 0 refills | Status: DC

## 2021-07-09 ENCOUNTER — Other Ambulatory Visit: Payer: Self-pay

## 2021-07-09 ENCOUNTER — Inpatient Hospital Stay: Payer: BC Managed Care – PPO

## 2021-07-09 ENCOUNTER — Inpatient Hospital Stay: Payer: BC Managed Care – PPO | Attending: Hematology & Oncology | Admitting: Hematology & Oncology

## 2021-07-09 ENCOUNTER — Other Ambulatory Visit: Payer: Self-pay | Admitting: Hematology & Oncology

## 2021-07-09 ENCOUNTER — Encounter: Payer: Self-pay | Admitting: Hematology & Oncology

## 2021-07-09 VITALS — BP 136/55 | HR 73 | Temp 98.3°F | Resp 18 | Wt 166.0 lb

## 2021-07-09 DIAGNOSIS — Z5112 Encounter for antineoplastic immunotherapy: Secondary | ICD-10-CM | POA: Diagnosis not present

## 2021-07-09 DIAGNOSIS — C9000 Multiple myeloma not having achieved remission: Secondary | ICD-10-CM | POA: Diagnosis not present

## 2021-07-09 DIAGNOSIS — C419 Malignant neoplasm of bone and articular cartilage, unspecified: Secondary | ICD-10-CM

## 2021-07-09 DIAGNOSIS — C9001 Multiple myeloma in remission: Secondary | ICD-10-CM | POA: Insufficient documentation

## 2021-07-09 MED ORDER — DARATUMUMAB-HYALURONIDASE-FIHJ 1800-30000 MG-UT/15ML ~~LOC~~ SOLN
1800.0000 mg | Freq: Once | SUBCUTANEOUS | Status: AC
  Administered 2021-07-09: 1800 mg via SUBCUTANEOUS
  Filled 2021-07-09: qty 15

## 2021-07-09 MED ORDER — DULOXETINE HCL 60 MG PO CPEP: 60.0000 mg | ORAL_CAPSULE | Freq: Every day | ORAL | 3 refills | Status: DC

## 2021-07-09 NOTE — Patient Instructions (Signed)
[facility] [place] [place]  Discharge Instructions: Thank you for choosing [facility] to provide your oncology and hematology care.   If you have a lab appointment with the [facility], please go directly to the [facility] and check in at the registration area.  Wear comfortable clothing and clothing appropriate for easy access to any Portacath or PICC line.   We strive to give you quality time with your provider. You may need to reschedule your appointment if you arrive late (15 or more minutes).  Arriving late affects you and other patients whose appointments are after yours.  Also, if you miss three or more appointments without notifying the office, you may be dismissed from the clinic at the provider's discretion.      For prescription refill requests, have your pharmacy contact our office and allow 72 hours for refills to be completed.    Today you received the following chemotherapy and/or immunotherapy agents Darzalex      To help prevent nausea and vomiting after your treatment, we encourage you to take your nausea medication as directed.  BELOW ARE SYMPTOMS THAT SHOULD BE REPORTED IMMEDIATELY: *FEVER GREATER THAN 100.4 F (38 C) OR HIGHER *CHILLS OR SWEATING *NAUSEA AND VOMITING THAT IS NOT CONTROLLED WITH YOUR NAUSEA MEDICATION *UNUSUAL SHORTNESS OF BREATH *UNUSUAL BRUISING OR BLEEDING *URINARY PROBLEMS (pain or burning when urinating, or frequent urination) *BOWEL PROBLEMS (unusual diarrhea, constipation, pain near the anus) TENDERNESS IN MOUTH AND THROAT WITH OR WITHOUT PRESENCE OF ULCERS (sore throat, sores in mouth, or a toothache) UNUSUAL RASH, SWELLING OR PAIN  UNUSUAL VAGINAL DISCHARGE OR ITCHING   Items with * indicate a potential emergency and should be followed up as soon as possible or go to the Emergency Department if any problems should occur.  Please show the CHEMOTHERAPY ALERT CARD or IMMUNOTHERAPY ALERT CARD at check-in to the  Emergency Department and triage nurse. Should you have questions after your visit or need to cancel or reschedule your appointment, please contact [facility]  [phone] and follow the prompts.  Office hours are 8:00 a.m. to 4:30 p.m. Monday - Friday. Please note that voicemails left after 4:00 p.m. may not be returned until the following business day.  We are closed weekends and major holidays. You have access to a nurse at all times for urgent questions. Please call the main number to the clinic [phone] and follow the prompts.  For any non-urgent questions, you may also contact your provider using MyChart. We now offer e-Visits for anyone 63 and older to request care online for non-urgent symptoms. For details visit [email or web address].   Also download the MyChart app! Go to the app store, search "MyChart", open the app, select [facility], and log in with your MyChart username and password.  Due to Covid, a mask is required upon entering the hospital/clinic. If you do not have a mask, one will be given to you upon arrival. For doctor visits, patients may have 1 support person aged 14 or older with them. For treatment visits, patients cannot have anyone with them due to current Covid guidelines and our immunocompromised population.

## 2021-07-09 NOTE — Progress Notes (Signed)
Hematology and Oncology Follow Up Visit  Bruce Smith [id number] 11-18-1950 70 y.o. 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  He was seen at [facility] a couple days ago.  Dr. Laverta Baltimore saw him.  Everything seem to be going quite well.  Dr. Laverta Baltimore did not think that any changes need to be made with his protocol with the Faspro.  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  He is on Lyrica right now.  We will try to get him over onto Cymbalta (60 mg p.o. daily).  We will taper down the Lyrica.  He will stop the morning dose of Lyrica and then take the evening dose for 5 days and then stop that.  He really looks better.  He is getting stronger.  He is more mobile.  His calcium is much better.  At some point, we will try to restart the [facility] on him.  His last myeloma studies done back in August showed M spike less than 0.1 g/dL.  His kappa light chain was 0.4 mg/dL.   He has had a good appetite.  He has had no change in bowel or bladder habits.  He has had no problems with rashes.  There has been a little bit of leg swelling but this is chronic.  He has had no cough or shortness of breath.  He hopes to go hunting in the fall.  Currently, I would say his performance status is ECOG 1.       Medications:  Allergies as of 07/09/2021   No Known Allergies      Medication List        Accurate as of July 09, 2021  10:19 AM. If you have any questions, ask your nurse or doctor.          STOP taking these medications    pregabalin 100 MG capsule Commonly known as: LYRICA Stopped by: Volanda Napoleon, MD   pregabalin 150 MG capsule Commonly known as: LYRICA Stopped by: Volanda Napoleon, MD       TAKE these medications    acetaminophen 500 MG tablet Commonly known as: TYLENOL Take 1 tablet (500 mg total) by mouth every 6 (six) hours.   acyclovir 400 MG tablet Commonly known as: ZOVIRAX Take 400 mg by mouth 2 (two) times daily.   calcium carbonate 1250 (500 Ca) MG tablet Commonly known as: OS-CAL - dosed in mg of elemental calcium Take 1 tablet (500 mg of elemental calcium total) by mouth 2 (two) times daily with a meal.   celecoxib 100 MG capsule Commonly known as: CELEBREX TAKE 1 CAPSULE BY MOUTH TWICE A DAY WIOTH FOOD FOR PAIN/INFLAMMATION   dronabinol 5 MG capsule Commonly known as: MARINOL TAKE 1 CAPSULE BY MOUTH TWICE DAILY BEFORE A MEAL   DULoxetine 60 MG capsule Commonly known as: CYMBALTA  Take 1 capsule (60 mg total) by mouth daily. Started by: Volanda Napoleon, MD   Florastor 250 MG capsule Generic drug: saccharomyces boulardii Take 250 mg by mouth 2 (two) times daily.   levETIRAcetam 500 MG tablet Commonly known as: KEPPRA TAKE 1 TABLET BY MOUTH 2 TIMES A DAY   loperamide 2 MG capsule Commonly known as: IMODIUM Take 1 capsule (2 mg total) by mouth as needed for diarrhea or loose stools.   MIRALAX PO Take by mouth daily as needed.   polyethylene glycol powder 17 GM/SCOOP powder Commonly known as: GLYCOLAX/MIRALAX Take by mouth.   MULTIVITAMIN MEN 50+ PO Take 1 tablet by mouth daily.   ondansetron 8 MG tablet Commonly known as: ZOFRAN TAKE 1 TABLET BY MOUTH EVERY 8 HOURS AS NEEDED NAUSEA AND VOMITING   pantoprazole 40 MG tablet Commonly known as: PROTONIX TAKE 1 TABLET BY MOUTH 2 TIMES A DAY BEFORE A MEAL   rOPINIRole 2 MG tablet Commonly known as:  Requip Take 1 tablet (2 mg total) by mouth at bedtime.   senna-docusate 8.6-50 MG tablet Commonly known as: Senokot-S Take by mouth as needed.   tamsulosin 0.4 MG Caps capsule Commonly known as: FLOMAX Take 0.4 mg by mouth at bedtime.   traMADol 50 MG tablet Commonly known as: ULTRAM Take 1 tablet (50 mg total) by mouth every 6 (six) hours.        Allergies: No Known Allergies  Past Medical History, Surgical history, Social history, and Family History were reviewed and updated.  Review of Systems: Review of Systems  Constitutional: Negative.   HENT: Negative.    Eyes: Negative.   Respiratory: Negative.    Cardiovascular: Negative.   Gastrointestinal: Negative.   Genitourinary: Negative.   Musculoskeletal:  Positive for joint pain.  Skin: Negative.   Neurological: Negative.   Endo/Heme/Allergies: Negative.   Psychiatric/Behavioral: Negative.      Physical Exam:  weight is 166 lb (75.3 kg). His oral temperature is 98.3 F (36.8 C). His blood pressure is 136/55 (abnormal) and his pulse is 73. His respiration is 18 and oxygen saturation is 99%.   Wt Readings from Last 3 Encounters:  07/09/21 166 lb (75.3 kg)  06/11/21 161 lb (73 kg)  04/29/21 156 lb (70.8 kg)    Physical Exam Vitals reviewed.  HENT:     Head: Normocephalic and atraumatic.  Eyes:     Pupils: Pupils are equal, round, and reactive to light.  Cardiovascular:     Rate and Rhythm: Normal rate and regular rhythm.     Heart sounds: Normal heart sounds.  Pulmonary:     Effort: Pulmonary effort is normal.     Breath sounds: Normal breath sounds.  Abdominal:     General: Bowel sounds are normal.     Palpations: Abdomen is soft.  Musculoskeletal:        General: No tenderness or deformity. Normal range of motion.     Cervical back: Normal range of motion.  Lymphadenopathy:     Cervical: No cervical adenopathy.  Skin:    General: Skin is warm and dry.     Findings: No erythema or rash.   Neurological:     Mental Status: He is alert and oriented to person, place, and time.  Psychiatric:        Behavior: Behavior normal.        Thought Content: Thought content normal.        Judgment: Judgment normal.     Lab Results  Component Value Date   WBC 6.2 06/11/2021   HGB 11.2 (L) 06/11/2021   HCT 34.7 (L) 06/11/2021   MCV 93.3 06/11/2021   PLT 202 06/11/2021   Lab Results  Component Value Date   FERRITIN 938 (H) 02/23/2021   IRON 67 02/23/2021   TIBC 156 (L) 02/23/2021   UIBC 90 (L) 02/23/2021   IRONPCTSAT 43 02/23/2021   Lab Results  Component Value Date   RBC 3.72 (L) 06/11/2021   Lab Results  Component Value Date   KPAFRELGTCHN 4.2 06/11/2021   LAMBDASER 1.9 (L) 06/11/2021   KAPLAMBRATIO 2.21 (H) 06/11/2021   Lab Results  Component Value Date   IGGSERUM 217 (L) 06/11/2021   IGGSERUM 255 (L) 06/11/2021   IGA 21 (L) 06/11/2021   IGA 24 (L) 06/11/2021   IGMSERUM 8 (L) 06/11/2021   IGMSERUM 7 (L) 06/11/2021   Lab Results  Component Value Date   TOTALPROTELP 4.6 (L) 06/11/2021   ALBUMINELP 2.8 (L) 06/11/2021   A1GS 0.2 06/11/2021   A2GS 0.7 06/11/2021   BETS 0.6 (L) 06/11/2021   GAMS 0.2 (L) 06/11/2021   MSPIKE <0.1 06/11/2021     Chemistry      Component Value Date/Time   NA 142 06/11/2021 0934   K 4.1 06/11/2021 0934   CL 107 06/11/2021 0934   CO2 29 06/11/2021 0934   BUN 14 06/11/2021 0934   CREATININE 0.63 06/11/2021 0934      Component Value Date/Time   CALCIUM 8.8 (L) 06/11/2021 0934   ALKPHOS 136 (H) 06/11/2021 0934   AST 20 06/11/2021 0934   ALT 20 06/11/2021 0934   BILITOT 0.3 06/11/2021 0934       Impression and Plan: Bruce Smith is a very pleasant 70 yo gentleman with kappa light chain myeloma with recent stem cell transplant with [facility] on 01/22/2021.   His recovery is coming along slowly but surely.  He is happy that everything is going so well.  He had a bone marrow biopsy that was done back in July.  This looks like he  was in remission.  Hopefully, he is negative for minimal residual disease.  We will continue him on the Faspro.  He will be going on vacation in October.  As such, we will move his appointment back week or so.  I will think about adding Xgeva and at some point in the near future.   Volanda Napoleon, MD 9/9/202210:19 AM

## 2021-07-15 ENCOUNTER — Other Ambulatory Visit: Payer: Self-pay | Admitting: *Deleted

## 2021-07-15 ENCOUNTER — Telehealth: Payer: Self-pay | Admitting: *Deleted

## 2021-07-15 MED ORDER — DULOXETINE HCL 60 MG PO CPEP: 60.0000 mg | ORAL_CAPSULE | Freq: Two times a day (BID) | ORAL | 3 refills | Status: DC

## 2021-07-15 NOTE — Telephone Encounter (Signed)
Call received from patient's wife to clarify stopping Lyrica and wanting to know if pt can increase Cymbalta to 60 mg BID to help with neuropathy.  Pt.'s wife instructed to stop Lyrica and that Cymbalta will be increased to 60 mg BID per order of Dr. [name].  Pt.'s wife is appreciative of assistance and has no further questions at this time.  Cymbalta script sent to [facility] per Kim's reqeust.

## 2021-08-02 ENCOUNTER — Other Ambulatory Visit: Payer: Self-pay | Admitting: Hematology & Oncology

## 2021-08-05 ENCOUNTER — Ambulatory Visit: Payer: BC Managed Care – PPO

## 2021-08-05 ENCOUNTER — Ambulatory Visit: Payer: BC Managed Care – PPO | Admitting: Hematology & Oncology

## 2021-08-05 ENCOUNTER — Other Ambulatory Visit: Payer: BC Managed Care – PPO

## 2021-08-05 NOTE — Progress Notes (Signed)
Nutrition Follow-up:  Patient s/p autologous stem cell transplant with [facility] for multiple myeloma on March 25th, 2022.  Patient admitted to [facility] on April 26 with failure to thrive/malnutrition and subsequently [facility] rehab.  Spoke with wife via phone for nutrition follow-up.  Patient reports that patient's appetite continues to be good.  Yesterday able to eat bacon and egg sandwich for breakfast.  Lunch was chicken salad and deviled egg with crackers.  Supper was meatloaf, black eyed peas, mixed greens, mashed potatoes and biscuit with strawberry cobbler and ice cream.  Wife reports nausea daily but takes zofran with relief.  Takes miralax daily to keep bowels moving.  Also taking marinol.    Medications: reviewed  Labs: reviewed  Anthropometrics:   Weight 166 lb on 9/9, increased  156 lb on 6/30 152 lb at [facility] on 6/6 145 lb on 5/31 per chart   NUTRITION DIAGNOSIS: Unintentional weight loss improving   INTERVENTION:  Continue high calorie, high protein meals to promote weight gain     MONITORING, EVALUATION, GOAL: weight trends, intake   NEXT VISIT: phone call in 6-8 weeks  Bruce Smith B. Zenia Resides, [place], [place] Registered Dietitian [phone] (mobile)

## 2021-08-14 ENCOUNTER — Other Ambulatory Visit: Payer: Self-pay | Admitting: Hematology & Oncology

## 2021-08-19 ENCOUNTER — Inpatient Hospital Stay: Payer: BC Managed Care – PPO

## 2021-08-19 ENCOUNTER — Encounter: Payer: Self-pay | Admitting: Hematology & Oncology

## 2021-08-19 ENCOUNTER — Inpatient Hospital Stay: Payer: BC Managed Care – PPO | Attending: Hematology & Oncology

## 2021-08-19 ENCOUNTER — Other Ambulatory Visit: Payer: Self-pay

## 2021-08-19 ENCOUNTER — Inpatient Hospital Stay (HOSPITAL_BASED_OUTPATIENT_CLINIC_OR_DEPARTMENT_OTHER): Payer: BC Managed Care – PPO | Admitting: Hematology & Oncology

## 2021-08-19 VITALS — BP 137/58 | HR 71 | Temp 98.7°F | Resp 18 | Wt 169.0 lb

## 2021-08-19 DIAGNOSIS — C9000 Multiple myeloma not having achieved remission: Secondary | ICD-10-CM

## 2021-08-19 DIAGNOSIS — C419 Malignant neoplasm of bone and articular cartilage, unspecified: Secondary | ICD-10-CM | POA: Diagnosis not present

## 2021-08-19 DIAGNOSIS — Z9484 Stem cells transplant status: Secondary | ICD-10-CM | POA: Insufficient documentation

## 2021-08-19 DIAGNOSIS — Z5112 Encounter for antineoplastic immunotherapy: Secondary | ICD-10-CM | POA: Insufficient documentation

## 2021-08-19 DIAGNOSIS — G629 Polyneuropathy, unspecified: Secondary | ICD-10-CM | POA: Diagnosis not present

## 2021-08-19 DIAGNOSIS — Z79899 Other long term (current) drug therapy: Secondary | ICD-10-CM | POA: Diagnosis not present

## 2021-08-19 LAB — CMP (CANCER CENTER ONLY)
ALT: 27 U/L (ref 0–44)
AST: 19 U/L (ref 15–41)
Albumin: 4 g/dL (ref 3.5–5.0)
Alkaline Phosphatase: 124 U/L (ref 38–126)
Anion gap: 8 (ref 5–15)
BUN: 19 mg/dL (ref 8–23)
CO2: 28 mmol/L (ref 22–32)
Calcium: 9.5 mg/dL (ref 8.9–10.3)
Chloride: 104 mmol/L (ref 98–111)
Creatinine: 0.88 mg/dL (ref 0.61–1.24)
GFR, Estimated: >60 mL/min (ref >60)
Glucose, Bld: 49 mg/dL — ABNORMAL LOW (ref 70–99)
Potassium: 4.5 mmol/L (ref 3.5–5.1)
Sodium: 140 mmol/L (ref 135–145)
Total Bilirubin: 0.3 mg/dL (ref 0.3–1.2)
Total Protein: 5.8 g/dL — ABNORMAL LOW (ref 6.5–8.1)

## 2021-08-19 LAB — CBC WITH DIFFERENTIAL (CANCER CENTER ONLY)
Abs Immature Granulocytes: 0.02 10*3/uL (ref 0.00–0.07)
Basophils Absolute: 0 10*3/uL (ref 0.0–0.1)
Basophils Relative: 0 %
Eosinophils Absolute: 0.1 10*3/uL (ref 0.0–0.5)
Eosinophils Relative: 2 %
HCT: 39.3 % (ref 39.0–52.0)
Hemoglobin: 13 g/dL (ref 13.0–17.0)
Immature Granulocytes: 0 %
Lymphocytes Relative: 16 %
Lymphs Abs: 0.9 10*3/uL (ref 0.7–4.0)
MCH: 30.4 pg (ref 26.0–34.0)
MCHC: 33.1 g/dL (ref 30.0–36.0)
MCV: 91.8 fL (ref 80.0–100.0)
Monocytes Absolute: 0.8 10*3/uL (ref 0.1–1.0)
Monocytes Relative: 13 %
Neutro Abs: 4.2 10*3/uL (ref 1.7–7.7)
Neutrophils Relative %: 69 %
Platelet Count: 186 10*3/uL (ref 150–400)
RBC: 4.28 MIL/uL (ref 4.22–5.81)
RDW: 15.2 % (ref 11.5–15.5)
WBC Count: 6 10*3/uL (ref 4.0–10.5)
nRBC: 0 % (ref 0.0–0.2)

## 2021-08-19 LAB — LACTATE DEHYDROGENASE: LDH: 128 U/L (ref 98–192)

## 2021-08-19 LAB — MAGNESIUM: Magnesium: 1.8 mg/dL (ref 1.7–2.4)

## 2021-08-19 MED ORDER — FAMOTIDINE 20 MG PO TABS: 40.0000 mg | ORAL_TABLET | Freq: Once | ORAL | Status: DC

## 2021-08-19 MED ORDER — TRAMADOL HCL 50 MG PO TABS: 100.0000 mg | ORAL_TABLET | Freq: Four times a day (QID) | ORAL | 0 refills | Status: DC | PRN

## 2021-08-19 MED ORDER — DARATUMUMAB-HYALURONIDASE-FIHJ 1800-30000 MG-UT/15ML ~~LOC~~ SOLN
1800.0000 mg | Freq: Once | SUBCUTANEOUS | Status: AC
  Administered 2021-08-19: 1800 mg via SUBCUTANEOUS
  Filled 2021-08-19: qty 15

## 2021-08-19 MED ORDER — ACETAMINOPHEN 325 MG PO TABS: 650.0000 mg | ORAL_TABLET | Freq: Once | ORAL | Status: DC

## 2021-08-19 MED ORDER — PREGABALIN 150 MG PO CAPS
150.0000 mg | ORAL_CAPSULE | Freq: Two times a day (BID) | ORAL | 2 refills | Status: DC
Start: 2021-08-19 — End: 2021-09-16

## 2021-08-19 MED ORDER — DENOSUMAB 120 MG/1.7ML ~~LOC~~ SOLN
120.0000 mg | Freq: Once | SUBCUTANEOUS | Status: AC
  Administered 2021-08-19: 120 mg via SUBCUTANEOUS
  Filled 2021-08-19: qty 1.7

## 2021-08-19 MED ORDER — DIPHENHYDRAMINE HCL 25 MG PO CAPS: 50.0000 mg | ORAL_CAPSULE | Freq: Once | ORAL | Status: DC

## 2021-08-19 NOTE — Progress Notes (Signed)
Hematology and Oncology Follow Up Visit  Bruce Smith [id number] July 22, 1951 70 y.o. 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  [place] are just down the beach.  That a wonderful time.  I saw great picture that he took of a rainbow over the ocean.  It is amazing how well he is improving.  He really looks fantastic.  He still bothered by neuropathy.  I suspect he may have this for quite a while.  He is on Lyrica.  We will increase his Lyrica 150 mg p.o. twice daily.  He is also on tramadol.  We can increase tramadol to 100 mg p.o. every 6 hours as needed.  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  He had a thumb infection recently.  He went it went to urgent care and had this lanced.  I am unsure of the culture is showed positivity.  He is on doxycycline for this.  He has had no other issues.  Of note, his last kappa light chain was 0.4 mg/dL.  He has had no change in bowel or bladder habits.  His appetite seems to be doing much better.  His blood sugar is only 49.  He is totally asymptomatic with this.  He has had no obvious fever.  He has had no bleeding.  There has been no rashes.  Overall, his performance status is ECOG 1.        Medications:  Allergies as of 08/19/2021   No Known Allergies      Medication List        Accurate as of August 19, 2021  9:24 AM. If you have any questions, ask your nurse or doctor.           acetaminophen 500 MG tablet Commonly known as: TYLENOL Take 1 tablet (500 mg total) by mouth every 6 (six) hours.   acyclovir 400 MG tablet Commonly known as: ZOVIRAX Take 400 mg by mouth 2 (two) times daily.   calcium carbonate 1250 (500 Ca) MG tablet Commonly known as: OS-CAL - dosed in mg of elemental calcium Take 1 tablet (500 mg of elemental calcium total) by mouth 2 (two) times daily with a meal.   celecoxib 100 MG capsule Commonly known as: CELEBREX TAKE 1 CAPSULE BY MOUTH TWICE A DAY WIOTH FOOD FOR PAIN/INFLAMMATION   doxycycline 100 MG capsule Commonly known as: VIBRAMYCIN Take 100 mg by mouth 2 (two) times daily.   dronabinol 5 MG capsule Commonly known as: MARINOL TAKE 1 CAPSULE BY MOUTH TWICE DAILY BEFORE A MEAL   DULoxetine 60 MG capsule Commonly known as: CYMBALTA Take 1 capsule (60 mg total) by mouth 2 (two) times daily.   Florastor 250 MG capsule Generic drug: saccharomyces boulardii Take 250 mg by mouth 2 (two) times daily.   levETIRAcetam 500 MG tablet Commonly known as: KEPPRA TAKE 1  TABLET BY MOUTH 2 TIMES A DAY   loperamide 2 MG capsule Commonly known as: IMODIUM Take 1 capsule (2 mg total) by mouth as needed for diarrhea or loose stools.   MIRALAX PO Take by mouth daily as needed.   polyethylene glycol powder 17 GM/SCOOP powder Commonly known as: GLYCOLAX/MIRALAX Take by mouth.   MULTIVITAMIN MEN 50+ PO Take 1 tablet by mouth daily.   ondansetron 8 MG tablet Commonly known as: ZOFRAN TAKE 1 TABLET BY MOUTH EVERY 8 HOURS AS NEEDED NAUSEA AND VOMITING   pantoprazole 40 MG tablet Commonly known as: PROTONIX TAKE 1 TABLET BY MOUTH 2 TIMES A DAY BEFORE A MEAL   pregabalin 100 MG capsule Commonly known as: LYRICA Take 100 mg by mouth every morning.   pregabalin 150 MG capsule Commonly known as: LYRICA Take 150 mg by mouth at bedtime.   rOPINIRole 2 MG tablet Commonly known as: Requip Take 1 tablet (2 mg total) by mouth  at bedtime.   senna-docusate 8.6-50 MG tablet Commonly known as: Senokot-S Take by mouth as needed.   tamsulosin 0.4 MG Caps capsule Commonly known as: FLOMAX Take 0.4 mg by mouth at bedtime.   traMADol 50 MG tablet Commonly known as: ULTRAM TAKE 1 TABLET BY MOUTH EVERY 6 HOURS   triamcinolone acetonide 40 MG/ML injection Commonly known as: KENALOG-40 SMARTSIG:2 Milliliter(s) IM        Allergies: No Known Allergies  Past Medical History, Surgical history, Social history, and Family History were reviewed and updated.  Review of Systems: Review of Systems  Constitutional: Negative.   HENT: Negative.    Eyes: Negative.   Respiratory: Negative.    Cardiovascular: Negative.   Gastrointestinal: Negative.   Genitourinary: Negative.   Musculoskeletal:  Positive for joint pain.  Skin: Negative.   Neurological: Negative.   Endo/Heme/Allergies: Negative.   Psychiatric/Behavioral: Negative.      Physical Exam:  weight is 169 lb (76.7 kg). His oral temperature is 98.7 F (37.1 C). His blood pressure is 137/58 (abnormal) and his pulse is 71. His respiration is 18 and oxygen saturation is 99%.   Wt Readings from Last 3 Encounters:  08/19/21 169 lb (76.7 kg)  07/09/21 166 lb (75.3 kg)  06/11/21 161 lb (73 kg)    Physical Exam Vitals reviewed.  HENT:     Head: Normocephalic and atraumatic.  Eyes:     Pupils: Pupils are equal, round, and reactive to light.  Cardiovascular:     Rate and Rhythm: Normal rate and regular rhythm.     Heart sounds: Normal heart sounds.  Pulmonary:     Effort: Pulmonary effort is normal.     Breath sounds: Normal breath sounds.  Abdominal:     General: Bowel sounds are normal.     Palpations: Abdomen is soft.  Musculoskeletal:        General: No tenderness or deformity. Normal range of motion.     Cervical back: Normal range of motion.  Lymphadenopathy:     Cervical: No cervical adenopathy.  Skin:    General: Skin is warm and dry.      Findings: No erythema or rash.  Neurological:     Mental Status: He is alert and oriented to person, place, and time.  Psychiatric:        Behavior: Behavior normal.        Thought Content: Thought content normal.        Judgment: Judgment normal.     Lab Results  Component  Value Date   WBC 6.0 08/19/2021   HGB 13.0 08/19/2021   HCT 39.3 08/19/2021   MCV 91.8 08/19/2021   PLT 186 08/19/2021   Lab Results  Component Value Date   FERRITIN 938 (H) 02/23/2021   IRON 67 02/23/2021   TIBC 156 (L) 02/23/2021   UIBC 90 (L) 02/23/2021   IRONPCTSAT 43 02/23/2021   Lab Results  Component Value Date   RBC 4.28 08/19/2021   Lab Results  Component Value Date   KPAFRELGTCHN 4.2 06/11/2021   LAMBDASER 1.9 (L) 06/11/2021   KAPLAMBRATIO 2.21 (H) 06/11/2021   Lab Results  Component Value Date   IGGSERUM 217 (L) 06/11/2021   IGGSERUM 255 (L) 06/11/2021   IGA 21 (L) 06/11/2021   IGA 24 (L) 06/11/2021   IGMSERUM 8 (L) 06/11/2021   IGMSERUM 7 (L) 06/11/2021   Lab Results  Component Value Date   TOTALPROTELP 4.6 (L) 06/11/2021   ALBUMINELP 2.8 (L) 06/11/2021   A1GS 0.2 06/11/2021   A2GS 0.7 06/11/2021   BETS 0.6 (L) 06/11/2021   GAMS 0.2 (L) 06/11/2021   MSPIKE <0.1 06/11/2021     Chemistry      Component Value Date/Time   NA 140 08/19/2021 0828   K 4.5 08/19/2021 0828   CL 104 08/19/2021 0828   CO2 28 08/19/2021 0828   BUN 19 08/19/2021 0828   CREATININE 0.88 08/19/2021 0828      Component Value Date/Time   CALCIUM 9.5 08/19/2021 0828   ALKPHOS 124 08/19/2021 0828   AST 19 08/19/2021 0828   ALT 27 08/19/2021 0828   BILITOT 0.3 08/19/2021 0828       Impression and Plan: Bruce Smith is a very pleasant 70 yo gentleman with kappa light chain myeloma with recent stem cell transplant with [facility] on 01/22/2021.   His recovery is coming along slowly but surely.  He really is getting close to his baseline now.  I just wish the neuropathy would get a little bit better.  We  will go ahead with his Faspro today.  We will keep him on maintenance Faspro.  His calcium is doing much better now.  We might be able to get him back on Xgeva.  We will plan to get him back in 1 month.    Volanda Napoleon, MD 10/20/20229:24 AM

## 2021-08-19 NOTE — Patient Instructions (Signed)
[facility] [place] [place]  Discharge Instructions: Thank you for choosing [facility] to provide your oncology and hematology care.   If you have a lab appointment with the [facility], please go directly to the [facility] and check in at the registration area.  Wear comfortable clothing and clothing appropriate for easy access to any Portacath or PICC line.   We strive to give you quality time with your provider. You may need to reschedule your appointment if you arrive late (15 or more minutes).  Arriving late affects you and other patients whose appointments are after yours.  Also, if you miss three or more appointments without notifying the office, you may be dismissed from the clinic at the provider's discretion.      For prescription refill requests, have your pharmacy contact our office and allow 72 hours for refills to be completed.    Today you received the following chemotherapy and/or immunotherapy agents Darzalex      To help prevent nausea and vomiting after your treatment, we encourage you to take your nausea medication as directed.  BELOW ARE SYMPTOMS THAT SHOULD BE REPORTED IMMEDIATELY: *FEVER GREATER THAN 100.4 F (38 C) OR HIGHER *CHILLS OR SWEATING *NAUSEA AND VOMITING THAT IS NOT CONTROLLED WITH YOUR NAUSEA MEDICATION *UNUSUAL SHORTNESS OF BREATH *UNUSUAL BRUISING OR BLEEDING *URINARY PROBLEMS (pain or burning when urinating, or frequent urination) *BOWEL PROBLEMS (unusual diarrhea, constipation, pain near the anus) TENDERNESS IN MOUTH AND THROAT WITH OR WITHOUT PRESENCE OF ULCERS (sore throat, sores in mouth, or a toothache) UNUSUAL RASH, SWELLING OR PAIN  UNUSUAL VAGINAL DISCHARGE OR ITCHING   Items with * indicate a potential emergency and should be followed up as soon as possible or go to the Emergency Department if any problems should occur.  Please show the CHEMOTHERAPY ALERT CARD or IMMUNOTHERAPY ALERT CARD at check-in to the  Emergency Department and triage nurse. Should you have questions after your visit or need to cancel or reschedule your appointment, please contact [facility]  [phone] and follow the prompts.  Office hours are 8:00 a.m. to 4:30 p.m. Monday - Friday. Please note that voicemails left after 4:00 p.m. may not be returned until the following business day.  We are closed weekends and major holidays. You have access to a nurse at all times for urgent questions. Please call the main number to the clinic [phone] and follow the prompts.  For any non-urgent questions, you may also contact your provider using MyChart. We now offer e-Visits for anyone 63 and older to request care online for non-urgent symptoms. For details visit [email or web address].   Also download the MyChart app! Go to the app store, search "MyChart", open the app, select [facility], and log in with your MyChart username and password.  Due to Covid, a mask is required upon entering the hospital/clinic. If you do not have a mask, one will be given to you upon arrival. For doctor visits, patients may have 1 support person aged 14 or older with them. For treatment visits, patients cannot have anyone with them due to current Covid guidelines and our immunocompromised population.

## 2021-08-20 LAB — KAPPA/LAMBDA LIGHT CHAINS
Kappa free light chain: 4.9 mg/L (ref 3.3–19.4)
Kappa, lambda light chain ratio: 2.33 — ABNORMAL HIGH (ref 0.26–1.65)
Lambda free light chains: 2.1 mg/L — ABNORMAL LOW (ref 5.7–26.3)

## 2021-08-20 LAB — IGG, IGA, IGM
IgA: 16 mg/dL — ABNORMAL LOW (ref 61–437)
IgG (Immunoglobin G), Serum: 260 mg/dL — ABNORMAL LOW (ref 603–1613)
IgM (Immunoglobulin M), Srm: 6 mg/dL — ABNORMAL LOW (ref 20–172)

## 2021-08-23 LAB — PROTEIN ELECTROPHORESIS, SERUM, WITH REFLEX
A/G Ratio: 2.1 — ABNORMAL HIGH (ref 0.7–1.7)
Albumin ELP: 3.7 g/dL (ref 2.9–4.4)
Alpha-1-Globulin: 0.2 g/dL (ref 0.0–0.4)
Alpha-2-Globulin: 0.7 g/dL (ref 0.4–1.0)
Beta Globulin: 0.7 g/dL (ref 0.7–1.3)
Gamma Globulin: 0.2 g/dL — ABNORMAL LOW (ref 0.4–1.8)
Globulin, Total: 1.8 g/dL — ABNORMAL LOW (ref 2.2–3.9)
Total Protein ELP: 5.5 g/dL — ABNORMAL LOW (ref 6.0–8.5)

## 2021-09-02 ENCOUNTER — Ambulatory Visit: Payer: BC Managed Care – PPO

## 2021-09-02 ENCOUNTER — Other Ambulatory Visit: Payer: BC Managed Care – PPO

## 2021-09-02 ENCOUNTER — Ambulatory Visit: Payer: BC Managed Care – PPO | Admitting: Hematology & Oncology

## 2021-09-09 ENCOUNTER — Inpatient Hospital Stay: Payer: BC Managed Care – PPO | Attending: Oncology

## 2021-09-09 NOTE — Progress Notes (Signed)
Nutrition Follow-up:  Patient s/p autologous stem cell transplant at [facility] 01/22/21.  Patient on maintenance Faspro.    Called and spoke with wife.  Reports that patient is doing great. Appetite is good and weight has increased.  Wife denies any needs at this time.     Anthropometrics:   169 lb on 10/20  166 lb 9/9 156 lb on 6/30 152 lb at [facility] on 6/6   NUTRITION DIAGNOSIS: Unintentional weight loss improved   INTERVENTION:  Patient to continue eating well balanced diet including good sources of protein.  Wife to call RD if patient has changes in nutritional status.     NEXT VISIT: no follow-up at this time RD available if nutritional status changes  Bruce Smith, [place], [place] Registered Dietitian [phone] (mobile)

## 2021-09-13 DIAGNOSIS — Z0000 Encounter for general adult medical examination without abnormal findings: Secondary | ICD-10-CM | POA: Insufficient documentation

## 2021-09-13 DIAGNOSIS — L03012 Cellulitis of left finger: Secondary | ICD-10-CM | POA: Insufficient documentation

## 2021-09-14 ENCOUNTER — Other Ambulatory Visit: Payer: Self-pay | Admitting: Physical Medicine and Rehabilitation

## 2021-09-14 DIAGNOSIS — R627 Adult failure to thrive: Secondary | ICD-10-CM

## 2021-09-14 NOTE — Telephone Encounter (Signed)
Refill request for Keppra and Celebrex sent from pharmacy. Would you like to refill?

## 2021-09-16 ENCOUNTER — Other Ambulatory Visit: Payer: Self-pay | Admitting: *Deleted

## 2021-09-16 ENCOUNTER — Inpatient Hospital Stay: Payer: BC Managed Care – PPO

## 2021-09-16 ENCOUNTER — Encounter: Payer: Self-pay | Admitting: Hematology & Oncology

## 2021-09-16 ENCOUNTER — Inpatient Hospital Stay (HOSPITAL_BASED_OUTPATIENT_CLINIC_OR_DEPARTMENT_OTHER): Payer: BC Managed Care – PPO | Admitting: Hematology & Oncology

## 2021-09-16 ENCOUNTER — Inpatient Hospital Stay: Payer: BC Managed Care – PPO | Attending: Hematology & Oncology

## 2021-09-16 ENCOUNTER — Other Ambulatory Visit: Payer: Self-pay

## 2021-09-16 VITALS — BP 137/63 | HR 74 | Temp 98.2°F | Resp 17 | Wt 173.1 lb

## 2021-09-16 DIAGNOSIS — C9000 Multiple myeloma not having achieved remission: Secondary | ICD-10-CM

## 2021-09-16 DIAGNOSIS — E440 Moderate protein-calorie malnutrition: Secondary | ICD-10-CM

## 2021-09-16 DIAGNOSIS — C419 Malignant neoplasm of bone and articular cartilage, unspecified: Secondary | ICD-10-CM

## 2021-09-16 DIAGNOSIS — Z5112 Encounter for antineoplastic immunotherapy: Secondary | ICD-10-CM | POA: Insufficient documentation

## 2021-09-16 DIAGNOSIS — R627 Adult failure to thrive: Secondary | ICD-10-CM

## 2021-09-16 LAB — CBC WITH DIFFERENTIAL (CANCER CENTER ONLY)
Abs Immature Granulocytes: 0.03 10*3/uL (ref 0.00–0.07)
Basophils Absolute: 0 10*3/uL (ref 0.0–0.1)
Basophils Relative: 0 %
Eosinophils Absolute: 0.1 10*3/uL (ref 0.0–0.5)
Eosinophils Relative: 1 %
HCT: 39 % (ref 39.0–52.0)
Hemoglobin: 12.9 g/dL — ABNORMAL LOW (ref 13.0–17.0)
Immature Granulocytes: 0 %
Lymphocytes Relative: 14 %
Lymphs Abs: 1 10*3/uL (ref 0.7–4.0)
MCH: 30.6 pg (ref 26.0–34.0)
MCHC: 33.1 g/dL (ref 30.0–36.0)
MCV: 92.6 fL (ref 80.0–100.0)
Monocytes Absolute: 0.7 10*3/uL (ref 0.1–1.0)
Monocytes Relative: 10 %
Neutro Abs: 5.1 10*3/uL (ref 1.7–7.7)
Neutrophils Relative %: 75 %
Platelet Count: 170 10*3/uL (ref 150–400)
RBC: 4.21 MIL/uL — ABNORMAL LOW (ref 4.22–5.81)
RDW: 14.9 % (ref 11.5–15.5)
WBC Count: 7 10*3/uL (ref 4.0–10.5)
nRBC: 0 % (ref 0.0–0.2)

## 2021-09-16 LAB — CMP (CANCER CENTER ONLY)
ALT: 29 U/L (ref 0–44)
AST: 16 U/L (ref 15–41)
Albumin: 3.9 g/dL (ref 3.5–5.0)
Alkaline Phosphatase: 109 U/L (ref 38–126)
Anion gap: 6 (ref 5–15)
BUN: 23 mg/dL (ref 8–23)
CO2: 29 mmol/L (ref 22–32)
Calcium: 9.6 mg/dL (ref 8.9–10.3)
Chloride: 105 mmol/L (ref 98–111)
Creatinine: 0.87 mg/dL (ref 0.61–1.24)
GFR, Estimated: >60 mL/min (ref >60)
Glucose, Bld: 77 mg/dL (ref 70–99)
Potassium: 4.5 mmol/L (ref 3.5–5.1)
Sodium: 140 mmol/L (ref 135–145)
Total Bilirubin: 0.3 mg/dL (ref 0.3–1.2)
Total Protein: 5.5 g/dL — ABNORMAL LOW (ref 6.5–8.1)

## 2021-09-16 LAB — LACTATE DEHYDROGENASE: LDH: 118 U/L (ref 98–192)

## 2021-09-16 MED ORDER — PANTOPRAZOLE SODIUM 40 MG PO TBEC: DELAYED_RELEASE_TABLET | ORAL | 0 refills | Status: DC

## 2021-09-16 MED ORDER — TAMSULOSIN HCL 0.4 MG PO CAPS: 0.4000 mg | ORAL_CAPSULE | Freq: Every day | ORAL | 0 refills | Status: DC

## 2021-09-16 MED ORDER — SODIUM CHLORIDE 0.9 % IV SOLN: Freq: Once | INTRAVENOUS | Status: DC

## 2021-09-16 MED ORDER — DARATUMUMAB-HYALURONIDASE-FIHJ 1800-30000 MG-UT/15ML ~~LOC~~ SOLN
1800.0000 mg | Freq: Once | SUBCUTANEOUS | Status: AC
  Administered 2021-09-16: 12:00:00 1800 mg via SUBCUTANEOUS
  Filled 2021-09-16: qty 15

## 2021-09-16 MED ORDER — ONDANSETRON HCL 8 MG PO TABS
ORAL_TABLET | ORAL | 1 refills | Status: DC
Start: 2021-09-16 — End: 2022-09-28

## 2021-09-16 MED ORDER — TRAMADOL HCL 50 MG PO TABS: 100.0000 mg | ORAL_TABLET | Freq: Four times a day (QID) | ORAL | 0 refills | Status: DC | PRN

## 2021-09-16 MED ORDER — FAMOTIDINE 20 MG PO TABS: 40.0000 mg | ORAL_TABLET | Freq: Once | ORAL | Status: DC

## 2021-09-16 MED ORDER — PREGABALIN 150 MG PO CAPS: 150.0000 mg | ORAL_CAPSULE | Freq: Two times a day (BID) | ORAL | 2 refills | Status: DC

## 2021-09-16 MED ORDER — DRONABINOL 5 MG PO CAPS: ORAL_CAPSULE | ORAL | 0 refills | Status: DC

## 2021-09-16 MED ORDER — LEVETIRACETAM 500 MG PO TABS: 500.0000 mg | ORAL_TABLET | Freq: Two times a day (BID) | ORAL | 0 refills | Status: DC

## 2021-09-16 MED ORDER — DIPHENHYDRAMINE HCL 25 MG PO CAPS: 50.0000 mg | ORAL_CAPSULE | Freq: Once | ORAL | Status: DC

## 2021-09-16 MED ORDER — ACYCLOVIR 400 MG PO TABS: 400.0000 mg | ORAL_TABLET | Freq: Two times a day (BID) | ORAL | 0 refills | Status: DC

## 2021-09-16 MED ORDER — CELECOXIB 100 MG PO CAPS: ORAL_CAPSULE | ORAL | 0 refills | Status: DC

## 2021-09-16 MED ORDER — PREGABALIN 150 MG PO CAPS: 150.0000 mg | ORAL_CAPSULE | Freq: Two times a day (BID) | ORAL | 0 refills | Status: DC

## 2021-09-16 MED ORDER — ACETAMINOPHEN 325 MG PO TABS: 650.0000 mg | ORAL_TABLET | Freq: Once | ORAL | Status: DC

## 2021-09-16 NOTE — Progress Notes (Signed)
Hematology and Oncology Follow Up Visit  Bruce Smith [id number] 16-Jan-1951 70 y.o. 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  He continues to look better and better.  I am very happy for him.  He really has gone through quite a bit but has come out so nicely right now.  We last saw him back in October, his Kappa light chain was 0.5 mg/dL.  The increase in Lyrica has definitely helped his pain.  He cannot tolerate, however, the increase in tramadol.  He has had no change in bowel or bladder habits.  He has had no problems with leg swelling.  There is been no rashes.  He has had no cough or shortness of breath.  He has had a little bit of vomiting.  He has had some nausea.  He is on Zofran for this.  Overall, I would say his performance status is ECOG 1.      Medications:  Allergies as of 09/16/2021   No Known Allergies      Medication List        Accurate as of September 16, 2021 10:48 AM. If you have any questions, ask your nurse or doctor.          acetaminophen 500 MG tablet Commonly known as: TYLENOL Take 1 tablet (500 mg total) by mouth every 6 (six) hours.   acyclovir 400 MG tablet Commonly known as: ZOVIRAX Take 400 mg by mouth 2 (two) times daily.   calcium carbonate 1250 (500 Ca) MG tablet Commonly known as: OS-CAL - dosed in mg of elemental calcium Take 1 tablet (500 mg of elemental calcium  total) by mouth 2 (two) times daily with a meal.   celecoxib 100 MG capsule Commonly known as: CELEBREX TAKE 1 CAPSULE BY MOUTH 2 TIMES DAILY WITH FOOD AS NEEDED FOR PAIN & INFLAMMATION   doxycycline 100 MG capsule Commonly known as: VIBRAMYCIN Take 100 mg by mouth 2 (two) times daily.   dronabinol 5 MG capsule Commonly known as: MARINOL TAKE 1 CAPSULE BY MOUTH TWICE DAILY BEFORE A MEAL   DULoxetine 60 MG capsule Commonly known as: CYMBALTA Take 1 capsule (60 mg total) by mouth 2 (two) times daily.   Florastor 250 MG capsule Generic drug: saccharomyces boulardii Take 250 mg by mouth 2 (two) times daily.   levETIRAcetam 500 MG tablet Commonly known as: KEPPRA TAKE 1 TABLET BY MOUTH 2 TIMES A DAY   loperamide 2 MG capsule Commonly known as: IMODIUM Take 1 capsule (2 mg total) by mouth as needed for diarrhea or loose stools.   MIRALAX PO Take by mouth daily as needed.   polyethylene glycol powder 17 GM/SCOOP powder Commonly known as: GLYCOLAX/MIRALAX Take by mouth.   MULTIVITAMIN MEN 50+ PO Take 1 tablet by mouth daily.   ondansetron 8 MG tablet Commonly known as: ZOFRAN TAKE 1 TABLET  BY MOUTH EVERY 8 HOURS AS NEEDED NAUSEA AND VOMITING   pantoprazole 40 MG tablet Commonly known as: PROTONIX TAKE 1 TABLET BY MOUTH 2 TIMES A DAY BEFORE A MEAL   pregabalin 100 MG capsule Commonly known as: LYRICA Take 100 mg by mouth every morning.   pregabalin 150 MG capsule Commonly known as: LYRICA Take 1 capsule (150 mg total) by mouth 2 (two) times daily.   rOPINIRole 2 MG tablet Commonly known as: Requip Take 1 tablet (2 mg total) by mouth at bedtime.   senna-docusate 8.6-50 MG tablet Commonly known as: Senokot-S Take by mouth as needed.   tamsulosin 0.4 MG Caps capsule Commonly known as: FLOMAX Take 0.4 mg by mouth at bedtime.   traMADol 50 MG tablet Commonly known as: ULTRAM Take 2 tablets (100 mg total) by mouth every 6 (six) hours as needed.   triamcinolone  acetonide 40 MG/ML injection Commonly known as: KENALOG-40 SMARTSIG:2 Milliliter(s) IM        Allergies: No Known Allergies  Past Medical History, Surgical history, Social history, and Family History were reviewed and updated.  Review of Systems: Review of Systems  Constitutional: Negative.   HENT: Negative.    Eyes: Negative.   Respiratory: Negative.    Cardiovascular: Negative.   Gastrointestinal: Negative.   Genitourinary: Negative.   Musculoskeletal:  Positive for joint pain.  Skin: Negative.   Neurological: Negative.   Endo/Heme/Allergies: Negative.   Psychiatric/Behavioral: Negative.      Physical Exam:  weight is 173 lb 1.9 oz (78.5 kg). His oral temperature is 98.2 F (36.8 C). His blood pressure is 137/63 and his pulse is 74. His respiration is 17 and oxygen saturation is 97%.   Wt Readings from Last 3 Encounters:  09/16/21 173 lb 1.9 oz (78.5 kg)  08/19/21 169 lb (76.7 kg)  07/09/21 166 lb (75.3 kg)    Physical Exam Vitals reviewed.  HENT:     Head: Normocephalic and atraumatic.  Eyes:     Pupils: Pupils are equal, round, and reactive to light.  Cardiovascular:     Rate and Rhythm: Normal rate and regular rhythm.     Heart sounds: Normal heart sounds.  Pulmonary:     Effort: Pulmonary effort is normal.     Breath sounds: Normal breath sounds.  Abdominal:     General: Bowel sounds are normal.     Palpations: Abdomen is soft.  Musculoskeletal:        General: No tenderness or deformity. Normal range of motion.     Cervical back: Normal range of motion.  Lymphadenopathy:     Cervical: No cervical adenopathy.  Skin:    General: Skin is warm and dry.     Findings: No erythema or rash.  Neurological:     Mental Status: He is alert and oriented to person, place, and time.  Psychiatric:        Behavior: Behavior normal.        Thought Content: Thought content normal.        Judgment: Judgment normal.     Lab Results  Component Value Date   WBC  7.0 09/16/2021   HGB 12.9 (L) 09/16/2021   HCT 39.0 09/16/2021   MCV 92.6 09/16/2021   PLT 170 09/16/2021   Lab Results  Component Value Date   FERRITIN 938 (H) 02/23/2021   IRON 67 02/23/2021   TIBC 156 (L) 02/23/2021   UIBC 90 (L) 02/23/2021   IRONPCTSAT 43 02/23/2021   Lab Results  Component Value Date   RBC 4.21 (L) 09/16/2021   Lab Results  Component Value Date   KPAFRELGTCHN 4.9 08/19/2021   LAMBDASER 2.1 (L) 08/19/2021   KAPLAMBRATIO 2.33 (H) 08/19/2021   Lab Results  Component Value Date   IGGSERUM 260 (L) 08/19/2021   IGA 16 (L) 08/19/2021   IGMSERUM 6 (L) 08/19/2021   Lab Results  Component Value Date   TOTALPROTELP 5.5 (L) 08/19/2021   ALBUMINELP 3.7 08/19/2021   A1GS 0.2 08/19/2021   A2GS 0.7 08/19/2021   BETS 0.7 08/19/2021   GAMS 0.2 (L) 08/19/2021   MSPIKE Not Observed 08/19/2021     Chemistry      Component Value Date/Time   NA 140 09/16/2021 0948   K 4.5 09/16/2021 0948   CL 105 09/16/2021 0948   CO2 29 09/16/2021 0948   BUN 23 09/16/2021 0948   CREATININE 0.87 09/16/2021 0948      Component Value Date/Time   CALCIUM 9.6 09/16/2021 0948   ALKPHOS 109 09/16/2021 0948   AST 16 09/16/2021 0948   ALT 29 09/16/2021 0948   BILITOT 0.3 09/16/2021 0948       Impression and Plan: Bruce Smith is a very pleasant 70 yo gentleman with kappa light chain myeloma with recent stem cell transplant with [facility] on 01/22/2021.   His recovery has really been extensive.  However, he has come back so nicely.  He pretty much is at his baseline now.  It be nice to give him Delton See but I just worry about his calcium levels.  I still think we are doing okay holding off on the Xgeva.  We will go ahead with the Faspro today.  I will plan to get him back in another month.    Volanda Napoleon, MD 11/17/202210:48 AM

## 2021-09-16 NOTE — Patient Instructions (Signed)
[facility]  Discharge Instructions: Thank you for choosing [facility] to provide your oncology and hematology care.   If you have a lab appointment with the [facility], please go directly to the [facility] and check in at the registration area.  Wear comfortable clothing and clothing appropriate for easy access to any Portacath or PICC line.   We strive to give you quality time with your provider. You may need to reschedule your appointment if you arrive late (15 or more minutes).  Arriving late affects you and other patients whose appointments are after yours.  Also, if you miss three or more appointments without notifying the office, you may be dismissed from the clinic at the provider's discretion.      For prescription refill requests, have your pharmacy contact our office and allow 72 hours for refills to be completed.    Today you received the following chemotherapy and/or immunotherapy agents Darzalex.   To help prevent nausea and vomiting after your treatment, we encourage you to take your nausea medication as directed.  BELOW ARE SYMPTOMS THAT SHOULD BE REPORTED IMMEDIATELY: *FEVER GREATER THAN 100.4 F (38 C) OR HIGHER *CHILLS OR SWEATING *NAUSEA AND VOMITING THAT IS NOT CONTROLLED WITH YOUR NAUSEA MEDICATION *UNUSUAL SHORTNESS OF BREATH *UNUSUAL BRUISING OR BLEEDING *URINARY PROBLEMS (pain or burning when urinating, or frequent urination) *BOWEL PROBLEMS (unusual diarrhea, constipation, pain near the anus) TENDERNESS IN MOUTH AND THROAT WITH OR WITHOUT PRESENCE OF ULCERS (sore throat, sores in mouth, or a toothache) UNUSUAL RASH, SWELLING OR PAIN  UNUSUAL VAGINAL DISCHARGE OR ITCHING   Items with * indicate a potential emergency and should be followed up as soon as possible or go to the Emergency Department if any problems should occur.  Please show the CHEMOTHERAPY ALERT CARD or IMMUNOTHERAPY ALERT CARD at check-in to the  Emergency Department and triage nurse. Should you have questions after your visit or need to cancel or reschedule your appointment, please contact [facility]  [phone] and follow the prompts.  Office hours are 8:00 a.m. to 4:30 p.m. Monday - Friday. Please note that voicemails left after 4:00 p.m. may not be returned until the following business day.  We are closed weekends and major holidays. You have access to a nurse at all times for urgent questions. Please call the main number to the clinic [phone] and follow the prompts.  For any non-urgent questions, you may also contact your provider using MyChart. We now offer e-Visits for anyone 70 and older to request care online for non-urgent symptoms. For details visit [email or web address].   Also download the MyChart app! Go to the app store, search "MyChart", open the app, select [facility], and log in with your MyChart username and password.  Due to Covid, a mask is required upon entering the hospital/clinic. If you do not have a mask, one will be given to you upon arrival. For doctor visits, patients may have 1 support person aged 70 or older with them. For treatment visits, patients cannot have anyone with them due to current Covid guidelines and our immunocompromised population.

## 2021-09-17 LAB — IGG, IGA, IGM
IgA: 14 mg/dL — ABNORMAL LOW (ref 61–437)
IgG (Immunoglobin G), Serum: 250 mg/dL — ABNORMAL LOW (ref 603–1613)
IgM (Immunoglobulin M), Srm: <5 mg/dL — ABNORMAL LOW (ref 20–172)

## 2021-09-17 LAB — KAPPA/LAMBDA LIGHT CHAINS
Kappa free light chain: 4.3 mg/L (ref 3.3–19.4)
Kappa, lambda light chain ratio: 2.05 — ABNORMAL HIGH (ref 0.26–1.65)
Lambda free light chains: 2.1 mg/L — ABNORMAL LOW (ref 5.7–26.3)

## 2021-09-20 DIAGNOSIS — Z5112 Encounter for antineoplastic immunotherapy: Secondary | ICD-10-CM | POA: Diagnosis not present

## 2021-09-21 LAB — PROTEIN ELECTROPHORESIS, SERUM, WITH REFLEX
A/G Ratio: 1.7 (ref 0.7–1.7)
Albumin ELP: 3.3 g/dL (ref 2.9–4.4)
Alpha-1-Globulin: 0.3 g/dL (ref 0.0–0.4)
Alpha-2-Globulin: 0.8 g/dL (ref 0.4–1.0)
Beta Globulin: 0.7 g/dL (ref 0.7–1.3)
Gamma Globulin: 0.2 g/dL — ABNORMAL LOW (ref 0.4–1.8)
Globulin, Total: 2 g/dL — ABNORMAL LOW (ref 2.2–3.9)
Total Protein ELP: 5.3 g/dL — ABNORMAL LOW (ref 6.0–8.5)

## 2021-09-22 LAB — UPEP/UIFE/LIGHT CHAINS/TP, 24-HR UR
% BETA, Urine: 0 %
ALPHA 1 URINE: 0 %
Albumin, U: 0 %
Alpha 2, Urine: 0 %
Free Kappa Lt Chains,Ur: 5.47 mg/L (ref 1.17–86.46)
Free Kappa/Lambda Ratio: >8.16 (ref 1.83–14.26)
Free Lambda Lt Chains,Ur: <0.67 mg/L (ref 0.27–15.21)
GAMMA GLOBULIN URINE: 0 %
Total Protein, Urine-Ur/day: <96 mg/24 hr (ref 30–150)
Total Protein, Urine: <4 mg/dL
Total Volume: 2400

## 2021-09-27 ENCOUNTER — Telehealth: Payer: Self-pay

## 2021-09-27 NOTE — Telephone Encounter (Signed)
Responded via MyChart.

## 2021-09-27 NOTE — Telephone Encounter (Signed)
-----   Message from Volanda Napoleon, MD sent at 09/22/2021  5:05 PM EST ----- Please call and tell him that the urine is clean.  There is no abnormal protein in the urine.  Thanks.  Laurey Arrow

## 2021-09-28 ENCOUNTER — Telehealth: Payer: Self-pay | Admitting: Hematology & Oncology

## 2021-09-30 LAB — COLOGUARD: COLOGUARD: NEGATIVE

## 2021-10-11 ENCOUNTER — Encounter: Payer: Self-pay | Admitting: Hematology & Oncology

## 2021-10-15 ENCOUNTER — Encounter: Payer: Self-pay | Admitting: *Deleted

## 2021-10-15 ENCOUNTER — Telehealth: Payer: Self-pay | Admitting: *Deleted

## 2021-10-15 NOTE — Telephone Encounter (Signed)
Returned call to pt spouse, discussed pt to reschedule 10days out from the last positive  test. Pt spouse  transferred to scheduling per her request.  Attempt to give spouse phone number to billing department to further assist with her concerns regarding insurance and itemized bill. Spouse states she was unable to write information down. No further concerns at this time. Called back and left information on VM.

## 2021-10-15 NOTE — Telephone Encounter (Signed)
Pt wife called on the physician line to discuss pt testing positive for Covid prior to thanksgiving and two days ago -10/13/2021   Relayed to spouse Joelene Millin our clinic protocol for Covid positive- pt will need to be rescheduled if results are with in 10 days. Spouse voiced concern regarding pt possibly testing positive again and again, his treatments, labs etc.  Discussed with spouse I will reach out to my manager regarding her concern.  No further concerns at this time.  Message to scheduling to r/s pt appt 10days out from pts last + Covid test dated 10/13/2021. Scheduling to call pt with new appt date/time

## 2021-10-18 ENCOUNTER — Ambulatory Visit: Payer: BC Managed Care – PPO

## 2021-10-18 ENCOUNTER — Ambulatory Visit: Payer: BC Managed Care – PPO | Admitting: Hematology & Oncology

## 2021-10-18 ENCOUNTER — Inpatient Hospital Stay: Payer: Medicare Other

## 2021-10-26 ENCOUNTER — Inpatient Hospital Stay: Payer: Medicare Other

## 2021-10-26 ENCOUNTER — Other Ambulatory Visit: Payer: Self-pay

## 2021-10-26 ENCOUNTER — Inpatient Hospital Stay (HOSPITAL_BASED_OUTPATIENT_CLINIC_OR_DEPARTMENT_OTHER): Payer: Medicare Other | Admitting: Hematology & Oncology

## 2021-10-26 ENCOUNTER — Inpatient Hospital Stay: Payer: Medicare Other | Attending: Hematology & Oncology

## 2021-10-26 ENCOUNTER — Encounter: Payer: Self-pay | Admitting: Hematology & Oncology

## 2021-10-26 ENCOUNTER — Other Ambulatory Visit: Payer: Self-pay | Admitting: Hematology & Oncology

## 2021-10-26 VITALS — BP 131/69 | HR 69 | Temp 99.0°F | Resp 18 | Wt 183.0 lb

## 2021-10-26 DIAGNOSIS — Z5112 Encounter for antineoplastic immunotherapy: Secondary | ICD-10-CM | POA: Diagnosis present

## 2021-10-26 DIAGNOSIS — C9000 Multiple myeloma not having achieved remission: Secondary | ICD-10-CM

## 2021-10-26 DIAGNOSIS — C419 Malignant neoplasm of bone and articular cartilage, unspecified: Secondary | ICD-10-CM

## 2021-10-26 LAB — CBC WITH DIFFERENTIAL (CANCER CENTER ONLY)
Abs Immature Granulocytes: 0.26 10*3/uL — ABNORMAL HIGH (ref 0.00–0.07)
Basophils Absolute: 0 10*3/uL (ref 0.0–0.1)
Basophils Relative: 0 %
Eosinophils Absolute: 0.1 10*3/uL (ref 0.0–0.5)
Eosinophils Relative: 1 %
HCT: 37.4 % — ABNORMAL LOW (ref 39.0–52.0)
Hemoglobin: 12.4 g/dL — ABNORMAL LOW (ref 13.0–17.0)
Immature Granulocytes: 3 %
Lymphocytes Relative: 12 %
Lymphs Abs: 0.9 10*3/uL (ref 0.7–4.0)
MCH: 31.1 pg (ref 26.0–34.0)
MCHC: 33.2 g/dL (ref 30.0–36.0)
MCV: 93.7 fL (ref 80.0–100.0)
Monocytes Absolute: 0.7 10*3/uL (ref 0.1–1.0)
Monocytes Relative: 9 %
Neutro Abs: 5.8 10*3/uL (ref 1.7–7.7)
Neutrophils Relative %: 75 %
Platelet Count: 181 10*3/uL (ref 150–400)
RBC: 3.99 MIL/uL — ABNORMAL LOW (ref 4.22–5.81)
RDW: 14.2 % (ref 11.5–15.5)
WBC Count: 7.8 10*3/uL (ref 4.0–10.5)
nRBC: 0 % (ref 0.0–0.2)

## 2021-10-26 LAB — CMP (CANCER CENTER ONLY)
ALT: 38 U/L (ref 0–44)
AST: 17 U/L (ref 15–41)
Albumin: 3.6 g/dL (ref 3.5–5.0)
Alkaline Phosphatase: 139 U/L — ABNORMAL HIGH (ref 38–126)
Anion gap: 7 (ref 5–15)
BUN: 18 mg/dL (ref 8–23)
CO2: 26 mmol/L (ref 22–32)
Calcium: 9.2 mg/dL (ref 8.9–10.3)
Chloride: 107 mmol/L (ref 98–111)
Creatinine: 0.82 mg/dL (ref 0.61–1.24)
GFR, Estimated: >60 mL/min (ref >60)
Glucose, Bld: 102 mg/dL — ABNORMAL HIGH (ref 70–99)
Potassium: 4.8 mmol/L (ref 3.5–5.1)
Sodium: 140 mmol/L (ref 135–145)
Total Bilirubin: 0.3 mg/dL (ref 0.3–1.2)
Total Protein: 5.5 g/dL — ABNORMAL LOW (ref 6.5–8.1)

## 2021-10-26 LAB — LACTATE DEHYDROGENASE: LDH: 139 U/L (ref 98–192)

## 2021-10-26 LAB — MAGNESIUM: Magnesium: 1.6 mg/dL — ABNORMAL LOW (ref 1.7–2.4)

## 2021-10-26 MED ORDER — DARATUMUMAB-HYALURONIDASE-FIHJ 1800-30000 MG-UT/15ML ~~LOC~~ SOLN
1800.0000 mg | Freq: Once | SUBCUTANEOUS | Status: AC
  Administered 2021-10-26: 12:00:00 1800 mg via SUBCUTANEOUS
  Filled 2021-10-26: qty 15

## 2021-10-26 NOTE — Patient Instructions (Signed)
[facility]  Discharge Instructions: Thank you for choosing [facility] to provide your oncology and hematology care.   If you have a lab appointment with the [facility], please go directly to the [facility] and check in at the registration area.  Wear comfortable clothing and clothing appropriate for easy access to any Portacath or PICC line.   We strive to give you quality time with your provider. You may need to reschedule your appointment if you arrive late (15 or more minutes).  Arriving late affects you and other patients whose appointments are after yours.  Also, if you miss three or more appointments without notifying the office, you may be dismissed from the clinic at the providers discretion.      For prescription refill requests, have your pharmacy contact our office and allow 72 hours for refills to be completed.    Today you received the following chemotherapy and/or immunotherapy agents Darzalex      To help prevent nausea and vomiting after your treatment, we encourage you to take your nausea medication as directed.  BELOW ARE SYMPTOMS THAT SHOULD BE REPORTED IMMEDIATELY: *FEVER GREATER THAN 100.4 F (38 C) OR HIGHER *CHILLS OR SWEATING *NAUSEA AND VOMITING THAT IS NOT CONTROLLED WITH YOUR NAUSEA MEDICATION *UNUSUAL SHORTNESS OF BREATH *UNUSUAL BRUISING OR BLEEDING *URINARY PROBLEMS (pain or burning when urinating, or frequent urination) *BOWEL PROBLEMS (unusual diarrhea, constipation, pain near the anus) TENDERNESS IN MOUTH AND THROAT WITH OR WITHOUT PRESENCE OF ULCERS (sore throat, sores in mouth, or a toothache) UNUSUAL RASH, SWELLING OR PAIN  UNUSUAL VAGINAL DISCHARGE OR ITCHING   Items with * indicate a potential emergency and should be followed up as soon as possible or go to the Emergency Department if any problems should occur.  Please show the CHEMOTHERAPY ALERT CARD or IMMUNOTHERAPY ALERT CARD at check-in to the  Emergency Department and triage nurse. Should you have questions after your visit or need to cancel or reschedule your appointment, please contact [facility]  [phone] and follow the prompts.  Office hours are 8:00 a.m. to 4:30 p.m. Monday - Friday. Please note that voicemails left after 4:00 p.m. may not be returned until the following business day.  We are closed weekends and major holidays. You have access to a nurse at all times for urgent questions. Please call the main number to the clinic [phone] and follow the prompts.  For any non-urgent questions, you may also contact your provider using MyChart. We now offer e-Visits for anyone 60 and older to request care online for non-urgent symptoms. For details visit [email or web address].   Also download the MyChart app! Go to the app store, search "MyChart", open the app, select [facility], and log in with your MyChart username and password.  Due to Covid, a mask is required upon entering the hospital/clinic. If you do not have a mask, one will be given to you upon arrival. For doctor visits, patients may have 1 support person aged 46 or older with them. For treatment visits, patients cannot have anyone with them due to current Covid guidelines and our immunocompromised population.

## 2021-10-26 NOTE — Progress Notes (Signed)
Hematology and Oncology Follow Up Visit  Bruce Smith [id number] 28-Feb-1951 69 y.o. 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  So far, everything seems to be going pretty well for him.  They did have a nice Thanksgiving.  That a wonderful Christmas despite the cold weather.  He is going down to [place] to go hunting in early January.  I am sure that he will have a wonderful time down in [place].  He is done well with pain.  He has had no problems with nausea or vomiting.  He has had no issues with fatigue or weakness.  He does get around with a cane.  His last kappa light chain in the serum back in November was 0.4 mg/dL.  The Lyrica does seem to be helping him.  I am happy about that part.  Overall, his performance status is ECOG 1.   Medications:  Allergies as of 10/26/2021   No Known Allergies      Medication List        Accurate as of October 26, 2021 10:28 AM. If you have any questions, ask your nurse or doctor.          STOP taking these medications    DULoxetine 60 MG capsule Commonly known as: CYMBALTA Stopped by: Volanda Napoleon, MD   Paxlovid (300/100) 20 x 150 MG & 10 x 100MG  Tbpk Generic drug: nirmatrelvir & ritonavir Stopped by: Volanda Napoleon, MD   rOPINIRole 2 MG tablet Commonly known as: Requip Stopped by: Volanda Napoleon,  MD       TAKE these medications    acetaminophen 500 MG tablet Commonly known as: TYLENOL Take 1 tablet (500 mg total) by mouth every 6 (six) hours. What changed: Another medication with the same name was removed. Continue taking this medication, and follow the directions you see here. Changed by: Volanda Napoleon, MD   acyclovir 400 MG tablet Commonly known as: ZOVIRAX Take 1 tablet (400 mg total) by mouth 2 (two) times daily.   benzonatate 200 MG capsule Commonly known as: TESSALON Take 200 mg by mouth 3 (three) times daily as needed.   calcium carbonate 1250 (500 Ca) MG tablet Commonly known as: OS-CAL - dosed in mg of elemental calcium Take 1 tablet (500 mg of elemental calcium total) by mouth 2 (two) times daily with a meal.   celecoxib 100 MG capsule Commonly known as: CELEBREX TAKE 1 CAPSULE BY MOUTH 2 TIMES DAILY WITH FOOD AS NEEDED FOR PAIN & INFLAMMATION   doxycycline 100 MG capsule Commonly known as: VIBRAMYCIN Take 100 mg by mouth 2 (two) times daily.   dronabinol 5 MG capsule Commonly known as: MARINOL TAKE 1 CAPSULE BY MOUTH TWICE DAILY BEFORE A MEAL   Florastor 250 MG capsule Generic drug: saccharomyces  boulardii Take 250 mg by mouth 2 (two) times daily.   levETIRAcetam 500 MG tablet Commonly known as: KEPPRA Take 1 tablet (500 mg total) by mouth 2 (two) times daily.   loperamide 2 MG capsule Commonly known as: IMODIUM Take 1 capsule (2 mg total) by mouth as needed for diarrhea or loose stools.   MULTIVITAMIN MEN 50+ PO Take 1 tablet by mouth daily.   ondansetron 8 MG tablet Commonly known as: ZOFRAN TAKE 1 TABLET BY MOUTH EVERY 8 HOURS AS NEEDED NAUSEA AND VOMITING   pantoprazole 40 MG tablet Commonly known as: PROTONIX TAKE 1 TABLET BY MOUTH 2 TIMES A DAY BEFORE A MEAL   polyethylene glycol powder 17 GM/SCOOP powder Commonly known as: GLYCOLAX/MIRALAX Take by mouth. What changed: Another medication with the same name was removed. Continue  taking this medication, and follow the directions you see here. Changed by: Volanda Napoleon, MD   pregabalin 150 MG capsule Commonly known as: LYRICA Take 1 capsule (150 mg total) by mouth 2 (two) times daily. What changed: Another medication with the same name was removed. Continue taking this medication, and follow the directions you see here. Changed by: Volanda Napoleon, MD   senna-docusate 8.6-50 MG tablet Commonly known as: Senokot-S Take by mouth as needed.   tamsulosin 0.4 MG Caps capsule Commonly known as: FLOMAX Take 1 capsule (0.4 mg total) by mouth at bedtime.   traMADol 50 MG tablet Commonly known as: ULTRAM Take 2 tablets (100 mg total) by mouth every 6 (six) hours as needed.   triamcinolone acetonide 40 MG/ML injection Commonly known as: KENALOG-40   Vitamin D (Ergocalciferol) 1.25 MG (50000 UNIT) Caps capsule Commonly known as: DRISDOL Take 50,000 Units by mouth once a week.        Allergies: No Known Allergies  Past Medical History, Surgical history, Social history, and Family History were reviewed and updated.  Review of Systems: Review of Systems  Constitutional: Negative.   HENT: Negative.    Eyes: Negative.   Respiratory: Negative.    Cardiovascular: Negative.   Gastrointestinal: Negative.   Genitourinary: Negative.   Musculoskeletal:  Positive for joint pain.  Skin: Negative.   Neurological: Negative.   Endo/Heme/Allergies: Negative.   Psychiatric/Behavioral: Negative.      Physical Exam:  weight is 183 lb (83 kg). His oral temperature is 99 F (37.2 C). His blood pressure is 131/69 and his pulse is 69. His respiration is 18 and oxygen saturation is 99%.   Wt Readings from Last 3 Encounters:  10/26/21 183 lb (83 kg)  09/16/21 173 lb 1.9 oz (78.5 kg)  08/19/21 169 lb (76.7 kg)    Physical Exam Vitals reviewed.  HENT:     Head: Normocephalic and atraumatic.  Eyes:     Pupils: Pupils are equal, round, and reactive to light.   Cardiovascular:     Rate and Rhythm: Normal rate and regular rhythm.     Heart sounds: Normal heart sounds.  Pulmonary:     Effort: Pulmonary effort is normal.     Breath sounds: Normal breath sounds.  Abdominal:     General: Bowel sounds are normal.     Palpations: Abdomen is soft.  Musculoskeletal:        General: No tenderness or deformity. Normal range of motion.     Cervical back: Normal range of motion.  Lymphadenopathy:     Cervical: No cervical adenopathy.  Skin:    General: Skin is warm and dry.  Findings: No erythema or rash.  Neurological:     Mental Status: He is alert and oriented to person, place, and time.  Psychiatric:        Behavior: Behavior normal.        Thought Content: Thought content normal.        Judgment: Judgment normal.     Lab Results  Component Value Date   WBC 7.8 10/26/2021   HGB 12.4 (L) 10/26/2021   HCT 37.4 (L) 10/26/2021   MCV 93.7 10/26/2021   PLT 181 10/26/2021   Lab Results  Component Value Date   FERRITIN 938 (H) 02/23/2021   IRON 67 02/23/2021   TIBC 156 (L) 02/23/2021   UIBC 90 (L) 02/23/2021   IRONPCTSAT 43 02/23/2021   Lab Results  Component Value Date   RBC 3.99 (L) 10/26/2021   Lab Results  Component Value Date   KPAFRELGTCHN 4.3 09/16/2021   LAMBDASER 2.1 (L) 09/16/2021   KAPLAMBRATIO >8.16 09/20/2021   Lab Results  Component Value Date   IGGSERUM 250 (L) 09/16/2021   IGA 14 (L) 09/16/2021   IGMSERUM <5 (L) 09/16/2021   Lab Results  Component Value Date   TOTALPROTELP 5.3 (L) 09/16/2021   ALBUMINELP 3.3 09/16/2021   A1GS 0.3 09/16/2021   A2GS 0.8 09/16/2021   BETS 0.7 09/16/2021   GAMS 0.2 (L) 09/16/2021   MSPIKE Not Observed 09/16/2021     Chemistry      Component Value Date/Time   NA 140 10/26/2021 0919   K 4.8 10/26/2021 0919   CL 107 10/26/2021 0919   CO2 26 10/26/2021 0919   BUN 18 10/26/2021 0919   CREATININE 0.82 10/26/2021 0919      Component Value Date/Time   CALCIUM 9.2  10/26/2021 0919   ALKPHOS 139 (H) 10/26/2021 0919   AST 17 10/26/2021 0919   ALT 38 10/26/2021 0919   BILITOT 0.3 10/26/2021 0919       Impression and Plan: Bruce Smith is a very pleasant 70 yo gentleman with kappa light chain myeloma with recent stem cell transplant with [facility] on 01/22/2021.   His recovery has really been extensive.  However, he has come back so nicely.  He is pretty much is at his baseline now.  He will get his Faspro today.  We will also give him Xgeva.  I think his calcium level should be okay for the Xgeva.  I do not see any problems with him going down to [place].  I told make sure he stays well-hydrated.   Volanda Napoleon, MD 12/27/202210:28 AM

## 2021-10-27 LAB — KAPPA/LAMBDA LIGHT CHAINS
Kappa free light chain: 7.5 mg/L (ref 3.3–19.4)
Kappa, lambda light chain ratio: 1.67 — ABNORMAL HIGH (ref 0.26–1.65)
Lambda free light chains: 4.5 mg/L — ABNORMAL LOW (ref 5.7–26.3)

## 2021-10-27 LAB — IGG, IGA, IGM
IgA: 16 mg/dL — ABNORMAL LOW (ref 61–437)
IgG (Immunoglobin G), Serum: 330 mg/dL — ABNORMAL LOW (ref 603–1613)
IgM (Immunoglobulin M), Srm: 15 mg/dL — ABNORMAL LOW (ref 20–172)

## 2021-10-28 LAB — PROTEIN ELECTROPHORESIS, SERUM, WITH REFLEX
A/G Ratio: 1.3 (ref 0.7–1.7)
Albumin ELP: 3.1 g/dL (ref 2.9–4.4)
Alpha-1-Globulin: 0.3 g/dL (ref 0.0–0.4)
Alpha-2-Globulin: 1 g/dL (ref 0.4–1.0)
Beta Globulin: 0.7 g/dL (ref 0.7–1.3)
Gamma Globulin: 0.3 g/dL — ABNORMAL LOW (ref 0.4–1.8)
Globulin, Total: 2.3 g/dL (ref 2.2–3.9)
Total Protein ELP: 5.4 g/dL — ABNORMAL LOW (ref 6.0–8.5)

## 2021-11-24 ENCOUNTER — Other Ambulatory Visit: Payer: Self-pay

## 2021-11-24 ENCOUNTER — Other Ambulatory Visit: Payer: Self-pay | Admitting: Hematology & Oncology

## 2021-11-24 ENCOUNTER — Inpatient Hospital Stay: Payer: Medicare Other

## 2021-11-24 ENCOUNTER — Encounter: Payer: Self-pay | Admitting: Hematology & Oncology

## 2021-11-24 ENCOUNTER — Inpatient Hospital Stay: Payer: Medicare Other | Attending: Hematology & Oncology

## 2021-11-24 ENCOUNTER — Inpatient Hospital Stay: Payer: Medicare Other | Admitting: Hematology & Oncology

## 2021-11-24 VITALS — BP 136/58 | HR 65 | Temp 98.8°F | Resp 20 | Wt 187.1 lb

## 2021-11-24 DIAGNOSIS — C9000 Multiple myeloma not having achieved remission: Secondary | ICD-10-CM | POA: Diagnosis present

## 2021-11-24 DIAGNOSIS — Z5112 Encounter for antineoplastic immunotherapy: Secondary | ICD-10-CM | POA: Insufficient documentation

## 2021-11-24 DIAGNOSIS — G629 Polyneuropathy, unspecified: Secondary | ICD-10-CM | POA: Diagnosis not present

## 2021-11-24 DIAGNOSIS — C419 Malignant neoplasm of bone and articular cartilage, unspecified: Secondary | ICD-10-CM

## 2021-11-24 LAB — CBC WITH DIFFERENTIAL (CANCER CENTER ONLY)
Abs Immature Granulocytes: 0.06 10*3/uL (ref 0.00–0.07)
Basophils Absolute: 0 10*3/uL (ref 0.0–0.1)
Basophils Relative: 0 %
Eosinophils Absolute: 0.2 10*3/uL (ref 0.0–0.5)
Eosinophils Relative: 3 %
HCT: 36.3 % — ABNORMAL LOW (ref 39.0–52.0)
Hemoglobin: 12.1 g/dL — ABNORMAL LOW (ref 13.0–17.0)
Immature Granulocytes: 1 %
Lymphocytes Relative: 14 %
Lymphs Abs: 1.1 10*3/uL (ref 0.7–4.0)
MCH: 31.2 pg (ref 26.0–34.0)
MCHC: 33.3 g/dL (ref 30.0–36.0)
MCV: 93.6 fL (ref 80.0–100.0)
Monocytes Absolute: 0.9 10*3/uL (ref 0.1–1.0)
Monocytes Relative: 11 %
Neutro Abs: 5.6 10*3/uL (ref 1.7–7.7)
Neutrophils Relative %: 71 %
Platelet Count: 192 10*3/uL (ref 150–400)
RBC: 3.88 MIL/uL — ABNORMAL LOW (ref 4.22–5.81)
RDW: 14.6 % (ref 11.5–15.5)
WBC Count: 7.9 10*3/uL (ref 4.0–10.5)
nRBC: 0 % (ref 0.0–0.2)

## 2021-11-24 LAB — CMP (CANCER CENTER ONLY)
ALT: 20 U/L (ref 0–44)
AST: 16 U/L (ref 15–41)
Albumin: 3.8 g/dL (ref 3.5–5.0)
Alkaline Phosphatase: 109 U/L (ref 38–126)
Anion gap: 8 (ref 5–15)
BUN: 17 mg/dL (ref 8–23)
CO2: 29 mmol/L (ref 22–32)
Calcium: 9.4 mg/dL (ref 8.9–10.3)
Chloride: 105 mmol/L (ref 98–111)
Creatinine: 0.92 mg/dL (ref 0.61–1.24)
GFR, Estimated: >60 mL/min (ref >60)
Glucose, Bld: 75 mg/dL (ref 70–99)
Potassium: 4.2 mmol/L (ref 3.5–5.1)
Sodium: 142 mmol/L (ref 135–145)
Total Bilirubin: 0.3 mg/dL (ref 0.3–1.2)
Total Protein: 5.6 g/dL — ABNORMAL LOW (ref 6.5–8.1)

## 2021-11-24 LAB — MAGNESIUM: Magnesium: 1.7 mg/dL (ref 1.7–2.4)

## 2021-11-24 LAB — LACTATE DEHYDROGENASE: LDH: 132 U/L (ref 98–192)

## 2021-11-24 MED ORDER — FAMOTIDINE 20 MG PO TABS: 40.0000 mg | ORAL_TABLET | Freq: Once | ORAL | Status: DC

## 2021-11-24 MED ORDER — ACETAMINOPHEN 325 MG PO TABS: 650.0000 mg | ORAL_TABLET | Freq: Once | ORAL | Status: DC

## 2021-11-24 MED ORDER — PREGABALIN 200 MG PO CAPS: 200.0000 mg | ORAL_CAPSULE | Freq: Two times a day (BID) | ORAL | 0 refills | Status: DC

## 2021-11-24 MED ORDER — DENOSUMAB 120 MG/1.7ML ~~LOC~~ SOLN
120.0000 mg | Freq: Once | SUBCUTANEOUS | Status: AC
  Administered 2021-11-24: 10:00:00 120 mg via SUBCUTANEOUS
  Filled 2021-11-24: qty 1.7

## 2021-11-24 MED ORDER — DIPHENHYDRAMINE HCL 25 MG PO CAPS: 50.0000 mg | ORAL_CAPSULE | Freq: Once | ORAL | Status: DC

## 2021-11-24 MED ORDER — DARATUMUMAB-HYALURONIDASE-FIHJ 1800-30000 MG-UT/15ML ~~LOC~~ SOLN
1800.0000 mg | Freq: Once | SUBCUTANEOUS | Status: AC
  Administered 2021-11-24: 10:00:00 1800 mg via SUBCUTANEOUS
  Filled 2021-11-24: qty 15

## 2021-11-24 NOTE — Progress Notes (Addendum)
Hematology and Oncology Follow Up Visit  Bruce Smith [id number] [birth year] 71 y.o. 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  He had a wonderful time down in [place].  He went down there in early January.  He showed me a picture of the buck that he killed.  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  He is having problems with neuropathy.  We will increase the Lyrica to 200 mg p.o. twice daily.  He has had no problems with cough or shortness of breath.  He has had a little bit of a cough however.  He and his wife both had COVID in the past.  His Kappa light chain is been doing fantastic.  When we last saw him, his Kappa light chain was 0.8 mg/dL.  He has had no problems with rashes.  He has had no bleeding.  Overall, his performance status is ECOG 1.    Medications:  Allergies as of 11/24/2021   No Known Allergies      Medication List        Accurate as of November 24, 2021  8:39 AM. If you have any questions, ask your nurse or doctor.          STOP taking these medications    doxycycline 100 MG capsule Commonly known as: VIBRAMYCIN Stopped by: Volanda Napoleon, MD   senna-docusate 8.6-50 MG tablet Commonly known as: Senokot-S Stopped by: Volanda Napoleon, MD   triamcinolone acetonide 40 MG/ML injection Commonly known as:  KENALOG-40 Stopped by: Volanda Napoleon, MD       TAKE these medications    acetaminophen 500 MG tablet Commonly known as: TYLENOL Take 1 tablet (500 mg total) by mouth every 6 (six) hours.   acyclovir 400 MG tablet Commonly known as: ZOVIRAX Take 1 tablet (400 mg total) by mouth 2 (two) times daily.   benzonatate 200 MG capsule Commonly known as: TESSALON Take 200 mg by mouth 3 (three) times daily as needed.   calcium carbonate 1250 (500 Ca) MG tablet Commonly known as: OS-CAL - dosed in mg of elemental calcium Take 1 tablet (500 mg of elemental calcium total) by mouth 2 (two) times daily with a meal.   celecoxib 100 MG capsule Commonly known as: CELEBREX TAKE 1 CAPSULE BY MOUTH 2 TIMES DAILY WITH FOOD AS NEEDED FOR PAIN & INFLAMMATION   dronabinol 5 MG capsule Commonly known as: MARINOL TAKE 1 CAPSULE BY MOUTH TWICE DAILY BEFORE A MEAL   Florastor 250 MG capsule Generic drug: saccharomyces boulardii Take 250 mg by mouth daily.   levETIRAcetam 500 MG tablet Commonly known as: KEPPRA Take 1 tablet (500 mg total) by mouth 2 (two) times daily.   loperamide 2 MG capsule Commonly known as: IMODIUM Take 1 capsule (2  mg total) by mouth as needed for diarrhea or loose stools.   MULTIVITAMIN MEN 50+ PO Take 1 tablet by mouth daily.   mupirocin ointment 2 % Commonly known as: BACTROBAN Apply topically daily.   ondansetron 8 MG tablet Commonly known as: ZOFRAN TAKE 1 TABLET BY MOUTH EVERY 8 HOURS AS NEEDED NAUSEA AND VOMITING   pantoprazole 40 MG tablet Commonly known as: PROTONIX TAKE 1 TABLET BY MOUTH 2 TIMES A DAY BEFORE A MEAL   polyethylene glycol powder 17 GM/SCOOP powder Commonly known as: GLYCOLAX/MIRALAX Take by mouth.   pregabalin 150 MG capsule Commonly known as: LYRICA Take 1 capsule (150 mg total) by mouth 2 (two) times daily.   tamsulosin 0.4 MG Caps capsule Commonly known as: FLOMAX Take 1 capsule (0.4 mg total) by mouth at bedtime.    traMADol 50 MG tablet Commonly known as: ULTRAM Take 2 tablets (100 mg total) by mouth every 6 (six) hours as needed.   Vitamin D (Ergocalciferol) 1.25 MG (50000 UNIT) Caps capsule Commonly known as: DRISDOL Take 50,000 Units by mouth once a week.        Allergies: No Known Allergies  Past Medical History, Surgical history, Social history, and Family History were reviewed and updated.  Review of Systems: Review of Systems  Constitutional: Negative.   HENT: Negative.    Eyes: Negative.   Respiratory: Negative.    Cardiovascular: Negative.   Gastrointestinal: Negative.   Genitourinary: Negative.   Musculoskeletal:  Positive for joint pain.  Skin: Negative.   Neurological: Negative.   Endo/Heme/Allergies: Negative.   Psychiatric/Behavioral: Negative.      Physical Exam:  weight is 187 lb 1.9 oz (84.9 kg). His oral temperature is 98.8 F (37.1 C). His blood pressure is 136/58 (abnormal) and his pulse is 65. His respiration is 20 and oxygen saturation is 98%.   Wt Readings from Last 3 Encounters:  11/24/21 187 lb 1.9 oz (84.9 kg)  10/26/21 183 lb (83 kg)  09/16/21 173 lb 1.9 oz (78.5 kg)    Physical Exam Vitals reviewed.  HENT:     Head: Normocephalic and atraumatic.  Eyes:     Pupils: Pupils are equal, round, and reactive to light.  Cardiovascular:     Rate and Rhythm: Normal rate and regular rhythm.     Heart sounds: Normal heart sounds.  Pulmonary:     Effort: Pulmonary effort is normal.     Breath sounds: Normal breath sounds.  Abdominal:     General: Bowel sounds are normal.     Palpations: Abdomen is soft.  Musculoskeletal:        General: No tenderness or deformity. Normal range of motion.     Cervical back: Normal range of motion.  Lymphadenopathy:     Cervical: No cervical adenopathy.  Skin:    General: Skin is warm and dry.     Findings: No erythema or rash.  Neurological:     Mental Status: He is alert and oriented to person, place, and time.   Psychiatric:        Behavior: Behavior normal.        Thought Content: Thought content normal.        Judgment: Judgment normal.     Lab Results  Component Value Date   WBC 7.9 11/24/2021   HGB 12.1 (L) 11/24/2021   HCT 36.3 (L) 11/24/2021   MCV 93.6 11/24/2021   PLT 192 11/24/2021   Lab Results  Component Value Date   FERRITIN 938 (  H) 02/23/2021   IRON 67 02/23/2021   TIBC 156 (L) 02/23/2021   UIBC 90 (L) 02/23/2021   IRONPCTSAT 43 02/23/2021   Lab Results  Component Value Date   RBC 3.88 (L) 11/24/2021   Lab Results  Component Value Date   KPAFRELGTCHN 7.5 10/26/2021   LAMBDASER 4.5 (L) 10/26/2021   KAPLAMBRATIO 1.67 (H) 10/26/2021   Lab Results  Component Value Date   IGGSERUM 330 (L) 10/26/2021   IGA 16 (L) 10/26/2021   IGMSERUM 15 (L) 10/26/2021   Lab Results  Component Value Date   TOTALPROTELP 5.4 (L) 10/26/2021   ALBUMINELP 3.1 10/26/2021   A1GS 0.3 10/26/2021   A2GS 1.0 10/26/2021   BETS 0.7 10/26/2021   GAMS 0.3 (L) 10/26/2021   MSPIKE Not Observed 10/26/2021     Chemistry      Component Value Date/Time   NA 140 10/26/2021 0919   K 4.8 10/26/2021 0919   CL 107 10/26/2021 0919   CO2 26 10/26/2021 0919   BUN 18 10/26/2021 0919   CREATININE 0.82 10/26/2021 0919      Component Value Date/Time   CALCIUM 9.2 10/26/2021 0919   ALKPHOS 139 (H) 10/26/2021 0919   AST 17 10/26/2021 0919   ALT 38 10/26/2021 0919   BILITOT 0.3 10/26/2021 0919       Impression and Plan: Mr. Rodino is a very pleasant 71 yo gentleman with kappa light chain myeloma with recent stem cell transplant with [facility] on 01/22/2021.   We are doing another 24-hour urine on him.  If this is urine looks okay without any measurable monoclonal light chain, then hopefully we do not have to do another urine on him for about 6 months.  I am just happy that he is doing so well with his quality of life.  He will get his Delton See today.  We will plan to get him back in another  month.   Volanda Napoleon, MD 1/25/20238:39 AM

## 2021-11-24 NOTE — Addendum Note (Signed)
Addended by: Volanda Napoleon on: 11/24/2021 09:23 AM   Modules accepted: Orders

## 2021-11-24 NOTE — Patient Instructions (Signed)
[facility]  Discharge Instructions: Thank you for choosing [facility] to provide your oncology and hematology care.   If you have a lab appointment with the [facility], please go directly to the [facility] and check in at the registration area.  Wear comfortable clothing and clothing appropriate for easy access to any Portacath or PICC line.   We strive to give you quality time with your provider. You may need to reschedule your appointment if you arrive late (15 or more minutes).  Arriving late affects you and other patients whose appointments are after yours.  Also, if you miss three or more appointments without notifying the office, you may be dismissed from the clinic at the providers discretion.      For prescription refill requests, have your pharmacy contact our office and allow 72 hours for refills to be completed.    Today you received the following chemotherapy and/or immunotherapy agents Darzalex      To help prevent nausea and vomiting after your treatment, we encourage you to take your nausea medication as directed.  BELOW ARE SYMPTOMS THAT SHOULD BE REPORTED IMMEDIATELY: *FEVER GREATER THAN 100.4 F (38 C) OR HIGHER *CHILLS OR SWEATING *NAUSEA AND VOMITING THAT IS NOT CONTROLLED WITH YOUR NAUSEA MEDICATION *UNUSUAL SHORTNESS OF BREATH *UNUSUAL BRUISING OR BLEEDING *URINARY PROBLEMS (pain or burning when urinating, or frequent urination) *BOWEL PROBLEMS (unusual diarrhea, constipation, pain near the anus) TENDERNESS IN MOUTH AND THROAT WITH OR WITHOUT PRESENCE OF ULCERS (sore throat, sores in mouth, or a toothache) UNUSUAL RASH, SWELLING OR PAIN  UNUSUAL VAGINAL DISCHARGE OR ITCHING   Items with * indicate a potential emergency and should be followed up as soon as possible or go to the Emergency Department if any problems should occur.  Please show the CHEMOTHERAPY ALERT CARD or IMMUNOTHERAPY ALERT CARD at check-in to the  Emergency Department and triage nurse. Should you have questions after your visit or need to cancel or reschedule your appointment, please contact [facility]  [phone] and follow the prompts.  Office hours are 8:00 a.m. to 4:30 p.m. Monday - Friday. Please note that voicemails left after 4:00 p.m. may not be returned until the following business day.  We are closed weekends and major holidays. You have access to a nurse at all times for urgent questions. Please call the main number to the clinic [phone] and follow the prompts.  For any non-urgent questions, you may also contact your provider using MyChart. We now offer e-Visits for anyone 42 and older to request care online for non-urgent symptoms. For details visit [email or web address].   Also download the MyChart app! Go to the app store, search "MyChart", open the app, select [facility], and log in with your MyChart username and password.  Due to Covid, a mask is required upon entering the hospital/clinic. If you do not have a mask, one will be given to you upon arrival. For doctor visits, patients may have 1 support person aged 72 or older with them. For treatment visits, patients cannot have anyone with them due to current Covid guidelines and our immunocompromised population.

## 2021-11-25 LAB — IGG, IGA, IGM
IgA: 20 mg/dL — ABNORMAL LOW (ref 61–437)
IgG (Immunoglobin G), Serum: 349 mg/dL — ABNORMAL LOW (ref 603–1613)
IgM (Immunoglobulin M), Srm: 9 mg/dL — ABNORMAL LOW (ref 20–172)

## 2021-11-25 LAB — KAPPA/LAMBDA LIGHT CHAINS
Kappa free light chain: 6.7 mg/L (ref 3.3–19.4)
Kappa, lambda light chain ratio: 1.08 (ref 0.26–1.65)
Lambda free light chains: 6.2 mg/L (ref 5.7–26.3)

## 2021-11-26 DIAGNOSIS — Z5112 Encounter for antineoplastic immunotherapy: Secondary | ICD-10-CM | POA: Diagnosis not present

## 2021-11-29 LAB — PROTEIN ELECTROPHORESIS, SERUM, WITH REFLEX
A/G Ratio: 1.3 (ref 0.7–1.7)
Albumin ELP: 2.9 g/dL (ref 2.9–4.4)
Alpha-1-Globulin: 0.3 g/dL (ref 0.0–0.4)
Alpha-2-Globulin: 0.9 g/dL (ref 0.4–1.0)
Beta Globulin: 0.7 g/dL (ref 0.7–1.3)
Gamma Globulin: 0.3 g/dL — ABNORMAL LOW (ref 0.4–1.8)
Globulin, Total: 2.2 g/dL (ref 2.2–3.9)
M-Spike, %: 0.1 g/dL — ABNORMAL HIGH
SPEP Interpretation: 0
Total Protein ELP: 5.1 g/dL — ABNORMAL LOW (ref 6.0–8.5)

## 2021-11-29 LAB — IMMUNOFIXATION REFLEX, SERUM
IgA: 21 mg/dL — ABNORMAL LOW (ref 61–437)
IgG (Immunoglobin G), Serum: 365 mg/dL — ABNORMAL LOW (ref 603–1613)
IgM (Immunoglobulin M), Srm: 10 mg/dL — ABNORMAL LOW (ref 20–172)

## 2021-11-30 LAB — UPEP/UIFE/LIGHT CHAINS/TP, 24-HR UR
% BETA, Urine: 0 %
ALPHA 1 URINE: 0 %
Albumin, U: 100 %
Alpha 2, Urine: 0 %
Free Kappa Lt Chains,Ur: 1.42 mg/L (ref 1.17–86.46)
Free Kappa/Lambda Ratio: >2.12 (ref 1.83–14.26)
Free Lambda Lt Chains,Ur: <0.67 mg/L (ref 0.27–15.21)
GAMMA GLOBULIN URINE: 0 %
Total Protein, Urine-Ur/day: 86 mg/24 hr (ref 30–150)
Total Protein, Urine: 4.8 mg/dL
Total Volume: 1800

## 2021-12-20 ENCOUNTER — Other Ambulatory Visit (HOSPITAL_COMMUNITY): Payer: Self-pay | Admitting: Orthopedic Surgery

## 2021-12-20 ENCOUNTER — Other Ambulatory Visit: Payer: Self-pay | Admitting: Orthopedic Surgery

## 2021-12-20 DIAGNOSIS — C9000 Multiple myeloma not having achieved remission: Secondary | ICD-10-CM

## 2021-12-21 ENCOUNTER — Ambulatory Visit (HOSPITAL_COMMUNITY): Admission: RE | Admit: 2021-12-21 | Payer: Medicare Other | Source: Ambulatory Visit

## 2021-12-22 ENCOUNTER — Inpatient Hospital Stay: Payer: Medicare Other

## 2021-12-22 ENCOUNTER — Inpatient Hospital Stay: Payer: Medicare Other | Attending: Hematology & Oncology

## 2021-12-22 ENCOUNTER — Other Ambulatory Visit: Payer: Self-pay

## 2021-12-22 ENCOUNTER — Inpatient Hospital Stay: Payer: Medicare Other | Admitting: Hematology & Oncology

## 2021-12-22 ENCOUNTER — Encounter: Payer: Self-pay | Admitting: Hematology & Oncology

## 2021-12-22 VITALS — BP 127/59 | HR 69 | Temp 98.0°F | Resp 18 | Ht 68.0 in | Wt 187.8 lb

## 2021-12-22 DIAGNOSIS — M25552 Pain in left hip: Secondary | ICD-10-CM | POA: Insufficient documentation

## 2021-12-22 DIAGNOSIS — C9000 Multiple myeloma not having achieved remission: Secondary | ICD-10-CM

## 2021-12-22 DIAGNOSIS — Z5112 Encounter for antineoplastic immunotherapy: Secondary | ICD-10-CM | POA: Insufficient documentation

## 2021-12-22 DIAGNOSIS — C419 Malignant neoplasm of bone and articular cartilage, unspecified: Secondary | ICD-10-CM

## 2021-12-22 LAB — CMP (CANCER CENTER ONLY)
ALT: 44 U/L (ref 0–44)
AST: 22 U/L (ref 15–41)
Albumin: 3.7 g/dL (ref 3.5–5.0)
Alkaline Phosphatase: 134 U/L — ABNORMAL HIGH (ref 38–126)
Anion gap: 6 (ref 5–15)
BUN: 17 mg/dL (ref 8–23)
CO2: 29 mmol/L (ref 22–32)
Calcium: 8.9 mg/dL (ref 8.9–10.3)
Chloride: 105 mmol/L (ref 98–111)
Creatinine: 0.91 mg/dL (ref 0.61–1.24)
GFR, Estimated: >60 mL/min (ref >60)
Glucose, Bld: 92 mg/dL (ref 70–99)
Potassium: 4.1 mmol/L (ref 3.5–5.1)
Sodium: 140 mmol/L (ref 135–145)
Total Bilirubin: 0.3 mg/dL (ref 0.3–1.2)
Total Protein: 5.6 g/dL — ABNORMAL LOW (ref 6.5–8.1)

## 2021-12-22 LAB — CBC WITH DIFFERENTIAL (CANCER CENTER ONLY)
Abs Immature Granulocytes: 0.04 10*3/uL (ref 0.00–0.07)
Basophils Absolute: 0 10*3/uL (ref 0.0–0.1)
Basophils Relative: 1 %
Eosinophils Absolute: 0.6 10*3/uL — ABNORMAL HIGH (ref 0.0–0.5)
Eosinophils Relative: 9 %
HCT: 40.1 % (ref 39.0–52.0)
Hemoglobin: 13 g/dL (ref 13.0–17.0)
Immature Granulocytes: 1 %
Lymphocytes Relative: 16 %
Lymphs Abs: 1 10*3/uL (ref 0.7–4.0)
MCH: 30.2 pg (ref 26.0–34.0)
MCHC: 32.4 g/dL (ref 30.0–36.0)
MCV: 93.3 fL (ref 80.0–100.0)
Monocytes Absolute: 0.9 10*3/uL (ref 0.1–1.0)
Monocytes Relative: 14 %
Neutro Abs: 3.9 10*3/uL (ref 1.7–7.7)
Neutrophils Relative %: 59 %
Platelet Count: 175 10*3/uL (ref 150–400)
RBC: 4.3 MIL/uL (ref 4.22–5.81)
RDW: 14.2 % (ref 11.5–15.5)
WBC Count: 6.5 10*3/uL (ref 4.0–10.5)
nRBC: 0 % (ref 0.0–0.2)

## 2021-12-22 LAB — LACTATE DEHYDROGENASE: LDH: 142 U/L (ref 98–192)

## 2021-12-22 LAB — MAGNESIUM: Magnesium: 1.8 mg/dL (ref 1.7–2.4)

## 2021-12-22 MED ORDER — ACETAMINOPHEN 325 MG PO TABS: 650.0000 mg | ORAL_TABLET | Freq: Once | ORAL | Status: DC

## 2021-12-22 MED ORDER — DIPHENHYDRAMINE HCL 25 MG PO CAPS: 50.0000 mg | ORAL_CAPSULE | Freq: Once | ORAL | Status: DC

## 2021-12-22 MED ORDER — FAMOTIDINE 20 MG PO TABS: 40.0000 mg | ORAL_TABLET | Freq: Once | ORAL | Status: DC

## 2021-12-22 MED ORDER — DARATUMUMAB-HYALURONIDASE-FIHJ 1800-30000 MG-UT/15ML ~~LOC~~ SOLN
1800.0000 mg | Freq: Once | SUBCUTANEOUS | Status: AC
  Administered 2021-12-22: 1800 mg via SUBCUTANEOUS
  Filled 2021-12-22: qty 15

## 2021-12-22 MED ORDER — HYDROMORPHONE HCL 1 MG/ML IJ SOLN: 3.0000 mg | Freq: Once | INTRAMUSCULAR | Status: DC

## 2021-12-22 MED ORDER — HYDROMORPHONE HCL 1 MG/ML IJ SOLN
3.0000 mg | Freq: Once | INTRAMUSCULAR | Status: AC
  Administered 2021-12-22: 3 mg via SUBCUTANEOUS
  Filled 2021-12-22: qty 3

## 2021-12-22 NOTE — Progress Notes (Signed)
Hematology and Oncology Follow Up Visit  Bruce Smith [id number] [birth year] 71 y.o. 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  Unfortunately, he is really having a lot of problems with his left hip and thigh.  He is really hurting.  Is hard for him to lie down.  This is a side that he did have hip repair because of a myelomatous lesion.  He has seen Dr. Mardelle Matte of Orthopedic Surgery.  Unfortunate, an MRI cannot be done because Bruce Smith cannot lie down for this.  He needs to be sedated.  The only way that this could happens with anesthesiology.  The soonest that this can be done at [facility] is late March.  It does not sound like myeloma is a issue.  His light chains have been nice and low and stable.  We did a 24-hour urine on him back in January.  There was no monoclonal kappa light chain in his urine.  His Kappa light chain level was 1.42 mg/L.  He really needs to have an MRI so that we can see if there is any type of nerve impingement.  If it was not for his left hip and leg, he would be feeling quite well.  He just has a hard time with lying down.  He is on Lyrica.  He also takes I think Tylenol and some Celebrex.  I will give him a subacute dose of Dilaudid to see if this might help a little bit while in the office today.  His appetite is doing okay.  He  has had no nausea or vomiting.  He has had no obvious change in bowel or bladder habits.  He has had no bleeding.  There has been no fever.  He has had no cough or shortness of breath.  Overall, I would say his performance status is probably ECOG 1.    Medications:  Allergies as of 12/22/2021   No Known Allergies      Medication List        Accurate as of December 22, 2021  1:32 PM. If you have any questions, ask your nurse or doctor.          STOP taking these medications    Vitamin D (Ergocalciferol) 1.25 MG (50000 UNIT) Caps capsule Commonly known as: DRISDOL Stopped by: Volanda Napoleon, MD       TAKE these medications    acetaminophen 500 MG tablet Commonly known as: TYLENOL Take 1 tablet (500 mg total) by mouth every 6 (six) hours.   acyclovir 400 MG tablet Commonly known as: ZOVIRAX Take 1 tablet (400 mg total) by mouth 2 (two) times daily.   aspirin 81 MG EC tablet Take by mouth.   benzonatate 200 MG capsule Commonly known as: TESSALON Take 200 mg by  mouth 3 (three) times daily as needed.   calcium carbonate 1250 (500 Ca) MG tablet Commonly known as: OS-CAL - dosed in mg of elemental calcium Take 1 tablet (500 mg of elemental calcium total) by mouth 2 (two) times daily with a meal.   celecoxib 100 MG capsule Commonly known as: CELEBREX TAKE 1 CAPSULE BY MOUTH 2 TIMES DAILY WITH FOOD AS NEEDED FOR PAIN & INFLAMMATION   dronabinol 5 MG capsule Commonly known as: MARINOL TAKE 1 CAPSULE BY MOUTH TWICE DAILY BEFORE A MEAL   Florastor 250 MG capsule Generic drug: saccharomyces boulardii Take 250 mg by mouth daily.   levETIRAcetam 500 MG tablet Commonly known as: KEPPRA Take 1 tablet (500 mg total) by mouth 2 (two) times daily.   loperamide 2 MG capsule Commonly known as: IMODIUM Take 1 capsule (2 mg total) by mouth as needed for diarrhea or loose stools.   MULTIVITAMIN MEN 50+ PO Take 1 tablet by mouth daily.   mupirocin ointment 2  % Commonly known as: BACTROBAN Apply topically daily.   ondansetron 8 MG tablet Commonly known as: ZOFRAN TAKE 1 TABLET BY MOUTH EVERY 8 HOURS AS NEEDED NAUSEA AND VOMITING   pantoprazole 40 MG tablet Commonly known as: PROTONIX TAKE 1 TABLET BY MOUTH 2 TIMES A DAY BEFORE A MEAL   polyethylene glycol powder 17 GM/SCOOP powder Commonly known as: GLYCOLAX/MIRALAX Take by mouth.   pregabalin 200 MG capsule Commonly known as: LYRICA Take 1 capsule (200 mg total) by mouth 2 (two) times daily.   tamsulosin 0.4 MG Caps capsule Commonly known as: FLOMAX Take 1 capsule (0.4 mg total) by mouth at bedtime.   traMADol 50 MG tablet Commonly known as: ULTRAM TAKE TWO TABLETS BY MOUTH EVERY 6 HOURS AS NEEDED FOR PAIN        Allergies: No Known Allergies  Past Medical History, Surgical history, Social history, and Family History were reviewed and updated.  Review of Systems: Review of Systems  Constitutional: Negative.   HENT: Negative.    Eyes: Negative.   Respiratory: Negative.    Cardiovascular: Negative.   Gastrointestinal: Negative.   Genitourinary: Negative.   Musculoskeletal:  Positive for joint pain.  Skin: Negative.   Neurological: Negative.   Endo/Heme/Allergies: Negative.   Psychiatric/Behavioral: Negative.      Physical Exam:  height is 5\' 8"  (1.727 m) and weight is 187 lb 12.8 oz (85.2 kg). His oral temperature is 98 F (36.7 C). His blood pressure is 127/59 (abnormal) and his pulse is 69. His respiration is 18 and oxygen saturation is 100%.   Wt Readings from Last 3 Encounters:  12/22/21 187 lb 12.8 oz (85.2 kg)  11/24/21 187 lb 1.9 oz (84.9 kg)  10/26/21 183 lb (83 kg)    Physical Exam Vitals reviewed.  HENT:     Head: Normocephalic and atraumatic.  Eyes:     Pupils: Pupils are equal, round, and reactive to light.  Cardiovascular:     Rate and Rhythm: Normal rate and regular rhythm.     Heart sounds: Normal heart sounds.  Pulmonary:     Effort:  Pulmonary effort is normal.     Breath sounds: Normal breath sounds.  Abdominal:     General: Bowel sounds are normal.     Palpations: Abdomen is soft.  Musculoskeletal:        General: No tenderness or deformity. Normal range of motion.     Cervical back: Normal range of motion.  Lymphadenopathy:  Cervical: No cervical adenopathy.  Skin:    General: Skin is warm and dry.     Findings: No erythema or rash.  Neurological:     Mental Status: He is alert and oriented to person, place, and time.  Psychiatric:        Behavior: Behavior normal.        Thought Content: Thought content normal.        Judgment: Judgment normal.     Lab Results  Component Value Date   WBC 6.5 12/22/2021   HGB 13.0 12/22/2021   HCT 40.1 12/22/2021   MCV 93.3 12/22/2021   PLT 175 12/22/2021   Lab Results  Component Value Date   FERRITIN 938 (H) 02/23/2021   IRON 67 02/23/2021   TIBC 156 (L) 02/23/2021   UIBC 90 (L) 02/23/2021   IRONPCTSAT 43 02/23/2021   Lab Results  Component Value Date   RBC 4.30 12/22/2021   Lab Results  Component Value Date   KPAFRELGTCHN 6.7 11/24/2021   LAMBDASER 6.2 11/24/2021   KAPLAMBRATIO >2.12 11/26/2021   Lab Results  Component Value Date   IGGSERUM 349 (L) 11/24/2021   IGGSERUM 365 (L) 11/24/2021   IGA 20 (L) 11/24/2021   IGA 21 (L) 11/24/2021   IGMSERUM 9 (L) 11/24/2021   IGMSERUM 10 (L) 11/24/2021   Lab Results  Component Value Date   TOTALPROTELP 5.1 (L) 11/24/2021   ALBUMINELP 2.9 11/24/2021   A1GS 0.3 11/24/2021   A2GS 0.9 11/24/2021   BETS 0.7 11/24/2021   GAMS 0.3 (L) 11/24/2021   MSPIKE 0.1 (H) 11/24/2021     Chemistry      Component Value Date/Time   NA 140 12/22/2021 0812   K 4.1 12/22/2021 0812   CL 105 12/22/2021 0812   CO2 29 12/22/2021 0812   BUN 17 12/22/2021 0812   CREATININE 0.91 12/22/2021 0812      Component Value Date/Time   CALCIUM 8.9 12/22/2021 0812   ALKPHOS 134 (H) 12/22/2021 0812   AST 22 12/22/2021 0812    ALT 44 12/22/2021 0812   BILITOT 0.3 12/22/2021 2993       Impression and Plan: Bruce Smith is a very pleasant 71 yo gentleman with 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   Again, I do not think that the issues are related to the myeloma.  However, he does need to be evaluated quickly.  We will have to see if he can have an MRI under sedation at [facility].  We will see if they might be able to accommodate Bruce Smith.  We will continue him with the Faspro.  He will not need Xgeva until April.  Hopefully, we get this pain taken care of so that he can function better and have a more active quality of life.   Volanda Napoleon, MD 2/22/20231:32 PM

## 2021-12-23 LAB — IGG, IGA, IGM
IgA: 18 mg/dL — ABNORMAL LOW (ref 61–437)
IgG (Immunoglobin G), Serum: 303 mg/dL — ABNORMAL LOW (ref 603–1613)
IgM (Immunoglobulin M), Srm: 7 mg/dL — ABNORMAL LOW (ref 20–172)

## 2021-12-23 LAB — KAPPA/LAMBDA LIGHT CHAINS
Kappa free light chain: 5.5 mg/L (ref 3.3–19.4)
Kappa, lambda light chain ratio: 1.41 (ref 0.26–1.65)
Lambda free light chains: 3.9 mg/L — ABNORMAL LOW (ref 5.7–26.3)

## 2021-12-24 ENCOUNTER — Other Ambulatory Visit: Payer: Self-pay | Admitting: Hematology & Oncology

## 2021-12-24 DIAGNOSIS — C419 Malignant neoplasm of bone and articular cartilage, unspecified: Secondary | ICD-10-CM

## 2021-12-27 ENCOUNTER — Other Ambulatory Visit: Payer: Self-pay | Admitting: Hematology & Oncology

## 2021-12-27 DIAGNOSIS — C9000 Multiple myeloma not having achieved remission: Secondary | ICD-10-CM

## 2021-12-27 DIAGNOSIS — R627 Adult failure to thrive: Secondary | ICD-10-CM

## 2021-12-27 DIAGNOSIS — E440 Moderate protein-calorie malnutrition: Secondary | ICD-10-CM

## 2022-01-19 ENCOUNTER — Encounter: Payer: Self-pay | Admitting: Hematology & Oncology

## 2022-01-19 ENCOUNTER — Inpatient Hospital Stay: Payer: Medicare Other | Attending: Hematology & Oncology

## 2022-01-19 ENCOUNTER — Inpatient Hospital Stay: Payer: Medicare Other

## 2022-01-19 ENCOUNTER — Inpatient Hospital Stay: Payer: Medicare Other | Admitting: Hematology & Oncology

## 2022-01-19 ENCOUNTER — Other Ambulatory Visit: Payer: Self-pay

## 2022-01-19 VITALS — BP 140/68 | HR 56 | Temp 97.5°F | Resp 18 | Ht 68.0 in | Wt 191.0 lb

## 2022-01-19 DIAGNOSIS — C419 Malignant neoplasm of bone and articular cartilage, unspecified: Secondary | ICD-10-CM

## 2022-01-19 DIAGNOSIS — C9000 Multiple myeloma not having achieved remission: Secondary | ICD-10-CM

## 2022-01-19 DIAGNOSIS — Z5112 Encounter for antineoplastic immunotherapy: Secondary | ICD-10-CM | POA: Diagnosis not present

## 2022-01-19 DIAGNOSIS — G629 Polyneuropathy, unspecified: Secondary | ICD-10-CM | POA: Diagnosis not present

## 2022-01-19 LAB — CBC WITH DIFFERENTIAL (CANCER CENTER ONLY)
Abs Immature Granulocytes: 0.03 10*3/uL (ref 0.00–0.07)
Basophils Absolute: 0 10*3/uL (ref 0.0–0.1)
Basophils Relative: 0 %
Eosinophils Absolute: 0.6 10*3/uL — ABNORMAL HIGH (ref 0.0–0.5)
Eosinophils Relative: 9 %
HCT: 38.3 % — ABNORMAL LOW (ref 39.0–52.0)
Hemoglobin: 12.7 g/dL — ABNORMAL LOW (ref 13.0–17.0)
Immature Granulocytes: 1 %
Lymphocytes Relative: 22 %
Lymphs Abs: 1.4 10*3/uL (ref 0.7–4.0)
MCH: 30.8 pg (ref 26.0–34.0)
MCHC: 33.2 g/dL (ref 30.0–36.0)
MCV: 93 fL (ref 80.0–100.0)
Monocytes Absolute: 0.8 10*3/uL (ref 0.1–1.0)
Monocytes Relative: 12 %
Neutro Abs: 3.7 10*3/uL (ref 1.7–7.7)
Neutrophils Relative %: 56 %
Platelet Count: 196 10*3/uL (ref 150–400)
RBC: 4.12 MIL/uL — ABNORMAL LOW (ref 4.22–5.81)
RDW: 14 % (ref 11.5–15.5)
WBC Count: 6.6 10*3/uL (ref 4.0–10.5)
nRBC: 0 % (ref 0.0–0.2)

## 2022-01-19 LAB — CMP (CANCER CENTER ONLY)
ALT: 29 U/L (ref 0–44)
AST: 17 U/L (ref 15–41)
Albumin: 3.9 g/dL (ref 3.5–5.0)
Alkaline Phosphatase: 137 U/L — ABNORMAL HIGH (ref 38–126)
Anion gap: 7 (ref 5–15)
BUN: 20 mg/dL (ref 8–23)
CO2: 27 mmol/L (ref 22–32)
Calcium: 9.6 mg/dL (ref 8.9–10.3)
Chloride: 106 mmol/L (ref 98–111)
Creatinine: 0.84 mg/dL (ref 0.61–1.24)
GFR, Estimated: >60 mL/min (ref >60)
Glucose, Bld: 72 mg/dL (ref 70–99)
Potassium: 4.2 mmol/L (ref 3.5–5.1)
Sodium: 140 mmol/L (ref 135–145)
Total Bilirubin: 0.3 mg/dL (ref 0.3–1.2)
Total Protein: 5.7 g/dL — ABNORMAL LOW (ref 6.5–8.1)

## 2022-01-19 LAB — LACTATE DEHYDROGENASE: LDH: 137 U/L (ref 98–192)

## 2022-01-19 MED ORDER — DARATUMUMAB-HYALURONIDASE-FIHJ 1800-30000 MG-UT/15ML ~~LOC~~ SOLN
1800.0000 mg | Freq: Once | SUBCUTANEOUS | Status: AC
  Administered 2022-01-19: 1800 mg via SUBCUTANEOUS
  Filled 2022-01-19: qty 15

## 2022-01-19 MED ORDER — ACETAMINOPHEN 325 MG PO TABS: 650.0000 mg | ORAL_TABLET | Freq: Once | ORAL | Status: DC

## 2022-01-19 MED ORDER — SODIUM CHLORIDE 0.9 % IV SOLN: Freq: Once | INTRAVENOUS | Status: DC

## 2022-01-19 MED ORDER — FAMOTIDINE 20 MG PO TABS: 40.0000 mg | ORAL_TABLET | Freq: Once | ORAL | Status: DC

## 2022-01-19 MED ORDER — DIPHENHYDRAMINE HCL 25 MG PO CAPS: 50.0000 mg | ORAL_CAPSULE | Freq: Once | ORAL | Status: DC

## 2022-01-19 NOTE — Progress Notes (Signed)
?Hematology and Oncology Follow Up Visit ? ?Bruce Smith ?[id number] ?Dec 23, 1950 71 y.o. ?01/19/2022 ? ? 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 ?Past Therapy: ?Status post surgical repair of left femur-07/14/2020 ?XRT to left shoulder and left hip ?Xgeva 120 mg subcu every 3 months-next dose in December 2022  ?Faspro/Velcade/Revlimid/Decadron -- s/p cycle #2 -- start on       08/06/2020  ( Revlimid is 14 on /14 off)  ?  ?Current Therapy:        ?ASCT -- [facility] on -01/22/2021 ?Faspro - q month -- maintenance -- start on 05/13/2021 ?Xgeva 120 mg subcu every 3 months-next dose in April 2023 ?  ?Interim History:  Bruce Smith is here today with his wife for follow-up.  He did have the MRI done.  This was done on 01/14/2022.  He does not have disc bulges in the lumbar spine.  He is going be referred to Dr. Earle Gell of Neurosurgery to see if what, anything can be done for this.  I am sure that he will be needing epidural injections. ? ?MRI of the left thigh did not show any fracture.  He did have some muscle edema suggestive of muscle strain. ? 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  He is on Lyrica for this. ? ?His myeloma has done incredibly well.  He saw Dr. Laverta [place] at [facility].  Everything was fine with Dr. Laverta Baltimore.  He did not think any changes need to be made with the protocol. ? ?He has had no fever.  Is gained weight.  Is eating better.  He has had no nausea or vomiting.  He has had no cough or shortness of breath. ? ?Overall, I would say his performance status is probably ECOG 1. ?  ? ?Medications:  ?Allergies as of 01/19/2022   ?No Known Allergies ?  ? ?  ?Medication List  ?  ? ?  ? Accurate as of January 19, 2022  9:19 AM. If you have any questions, ask your nurse or doctor.  ?  ?  ? ?  ? ?acetaminophen 500 MG tablet ?Commonly known as: TYLENOL ?Take 1 tablet (500 mg total) by mouth every 6 (six) hours. ?  ?acyclovir 400 MG  tablet ?Commonly known as: ZOVIRAX ?Take 1 tablet (400 mg total) by mouth 2 (two) times daily. ?  ?aspirin 81 MG EC tablet ?Take by mouth. ?  ?benzonatate 200 MG capsule ?Commonly known as: TESSALON ?Take 200 mg by mouth 3 (three) times daily as needed. ?  ?calcium carbonate 1250 (500 Ca) MG tablet ?Commonly known as: OS-CAL - dosed in mg of elemental calcium ?Take 1 tablet (500 mg of elemental calcium total) by mouth 2 (two) times daily with a meal. ?  ?celecoxib 100 MG capsule ?Commonly known as: CELEBREX ?TAKE 1 CAPSULE BY MOUTH 2 TIMES DAILY WITH FOOD AS NEEDED FOR PAIN & INFLAMMATION ?  ?dronabinol 5 MG capsule ?Commonly known as: MARINOL ?TAKE 1 CAPSULE BY MOUTH TWICE DAILY BEFORE A MEAL ?  ?Florastor 250 MG capsule ?Generic drug: saccharomyces boulardii ?Take 250 mg by mouth daily. ?  ?levETIRAcetam 500 MG tablet ?Commonly known as: KEPPRA ?Take 1 tablet (500 mg total) by mouth 2 (two) times daily. ?  ?loperamide 2 MG capsule ?Commonly known as: IMODIUM ?Take 1 capsule (2 mg total) by mouth as needed for diarrhea or loose stools. ?  ?MULTIVITAMIN MEN 50+ PO ?Take 1 tablet by mouth  daily. ?  ?mupirocin ointment 2 % ?Commonly known as: BACTROBAN ?Apply topically daily. ?  ?ondansetron 8 MG tablet ?Commonly known as: ZOFRAN ?TAKE 1 TABLET BY MOUTH EVERY 8 HOURS AS NEEDED NAUSEA AND VOMITING ?  ?pantoprazole 40 MG tablet ?Commonly known as: PROTONIX ?TAKE 1 TABLET BY MOUTH 2 TIMES A DAY BEFORE MEALS FOR ACID REFLUX ?  ?polyethylene glycol powder 17 GM/SCOOP powder ?Commonly known as: GLYCOLAX/MIRALAX ?Take by mouth. ?  ?pregabalin 200 MG capsule ?Commonly known as: LYRICA ?Take 1 capsule (200 mg total) by mouth 2 (two) times daily. ?  ?tamsulosin 0.4 MG Caps capsule ?Commonly known as: FLOMAX ?TAKE ONE CAPSULE BY MOUTH AT BEDTIME ?  ?traMADol 50 MG tablet ?Commonly known as: ULTRAM ?TAKE TWO TABLETS BY MOUTH EVERY 6 HOURS AS NEEDED FOR PAIN ?  ? ?  ? ? ?Allergies: No Known Allergies ? ?Past Medical History,  Surgical history, Social history, and Family History were reviewed and updated. ? ?Review of Systems: ?Review of Systems  ?Constitutional: Negative.   ?HENT: Negative.    ?Eyes: Negative.   ?Respiratory: Negative.    ?Cardiovascular: Negative.   ?Gastrointestinal: Negative.   ?Genitourinary: Negative.   ?Musculoskeletal:  Positive for joint pain.  ?Skin: Negative.   ?Neurological: Negative.   ?Endo/Heme/Allergies: Negative.   ?Psychiatric/Behavioral: Negative.    ? ? ?Physical Exam: ? height is '5\' 8"'$  (1.727 m) and weight is 191 lb (86.6 kg). His oral temperature is 97.5 ?F (36.4 ?C) (abnormal). His blood pressure is 140/68 and his pulse is 56 (abnormal). His respiration is 18 and oxygen saturation is 98%.  ? ?Wt Readings from Last 3 Encounters:  ?01/19/22 191 lb (86.6 kg)  ?12/22/21 187 lb 12.8 oz (85.2 kg)  ?11/24/21 187 lb 1.9 oz (84.9 kg)  ? ? ?Physical Exam ?Vitals reviewed.  ?HENT:  ?   Head: Normocephalic and atraumatic.  ?Eyes:  ?   Pupils: Pupils are equal, round, and reactive to light.  ?Cardiovascular:  ?   Rate and Rhythm: Normal rate and regular rhythm.  ?   Heart sounds: Normal heart sounds.  ?Pulmonary:  ?   Effort: Pulmonary effort is normal.  ?   Breath sounds: Normal breath sounds.  ?Abdominal:  ?   General: Bowel sounds are normal.  ?   Palpations: Abdomen is soft.  ?Musculoskeletal:     ?   General: No tenderness or deformity. Normal range of motion.  ?   Cervical back: Normal range of motion.  ?Lymphadenopathy:  ?   Cervical: No cervical adenopathy.  ?Skin: ?   General: Skin is warm and dry.  ?   Findings: No erythema or rash.  ?Neurological:  ?   Mental Status: He is alert and oriented to person, place, and time.  ?Psychiatric:     ?   Behavior: Behavior normal.     ?   Thought Content: Thought content normal.     ?   Judgment: Judgment normal.  ? ? ? ?Lab Results  ?Component Value Date  ? WBC 6.6 01/19/2022  ? HGB 12.7 (L) 01/19/2022  ? HCT 38.3 (L) 01/19/2022  ? MCV 93.0 01/19/2022  ? PLT 196  01/19/2022  ? ?Lab Results  ?Component Value Date  ? FERRITIN 938 (H) 02/23/2021  ? IRON 67 02/23/2021  ? TIBC 156 (L) 02/23/2021  ? UIBC 90 (L) 02/23/2021  ? IRONPCTSAT 43 02/23/2021  ? ?Lab Results  ?Component Value Date  ? RBC 4.12 (L) 01/19/2022  ? ?Lab Results  ?  Component Value Date  ? KPAFRELGTCHN 5.5 12/22/2021  ? LAMBDASER 3.9 (L) 12/22/2021  ? KAPLAMBRATIO 1.41 12/22/2021  ? ?Lab Results  ?Component Value Date  ? IGGSERUM 303 (L) 12/22/2021  ? IGA 18 (L) 12/22/2021  ? IGMSERUM 7 (L) 12/22/2021  ? ?Lab Results  ?Component Value Date  ? TOTALPROTELP 5.1 (L) 11/24/2021  ? ALBUMINELP 2.9 11/24/2021  ? A1GS 0.3 11/24/2021  ? A2GS 0.9 11/24/2021  ? BETS 0.7 11/24/2021  ? GAMS 0.3 (L) 11/24/2021  ? MSPIKE 0.1 (H) 11/24/2021  ? ?  Chemistry   ?   ?Component Value Date/Time  ? NA 140 01/19/2022 0819  ? K 4.2 01/19/2022 0819  ? CL 106 01/19/2022 0819  ? CO2 27 01/19/2022 0819  ? BUN 20 01/19/2022 0819  ? CREATININE 0.84 01/19/2022 0819  ?    ?Component Value Date/Time  ? CALCIUM 9.6 01/19/2022 0819  ? ALKPHOS 137 (H) 01/19/2022 5027  ? AST 17 01/19/2022 0819  ? ALT 29 01/19/2022 0819  ? BILITOT 0.3 01/19/2022 0819  ?  ? ? ? ?Impression and Plan: Bruce Smith is a very pleasant 71 yo gentleman with 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  ? ?Hopefully, his back can be helped.  Again it looks like there is several areas of disc bulging.  I am sure Dr. Arnoldo Morale, who is wonderful with Neurosurgery, will be able to help.  I would think epidural steroids might be necessary right now. ? ?We will go ahead with his Faslodex.  Of note, his last Kappa light chain was 0.6 mg/dL. ? ?I do not have any problems with any intervention that would be necessary for his back.  Even if he needed surgery, I do not see a problem with that from what were doing with his myeloma. ? ?We will plan to get him back in another month. ? ? ?Volanda Napoleon, MD ?3/22/20239:19 AM ?

## 2022-01-19 NOTE — Patient Instructions (Signed)
[facility] [place]  Discharge Instructions: ?Thank you for choosing [facility] to provide your oncology and hematology care.  ? ?If you have a lab appointment with the [facility], please go directly to the [facility] and check in at the registration area. ? ?Wear comfortable clothing and clothing appropriate for easy access to any Portacath or PICC line.  ? ?We strive to give you quality time with your provider. You may need to reschedule your appointment if you arrive late (15 or more minutes).  Arriving late affects you and other patients whose appointments are after yours.  Also, if you miss three or more appointments without notifying the office, you may be dismissed from the clinic at the provider?s discretion.    ?  ?For prescription refill requests, have your pharmacy contact our office and allow 72 hours for refills to be completed.   ? ?Today you received the following chemotherapy and/or immunotherapy agents Darzalex  ?  ?To help prevent nausea and vomiting after your treatment, we encourage you to take your nausea medication as directed. ? ?BELOW ARE SYMPTOMS THAT SHOULD BE REPORTED IMMEDIATELY: ?*FEVER GREATER THAN 100.4 F (38 ?C) OR HIGHER ?*CHILLS OR SWEATING ?*NAUSEA AND VOMITING THAT IS NOT CONTROLLED WITH YOUR NAUSEA MEDICATION ?*UNUSUAL SHORTNESS OF BREATH ?*UNUSUAL BRUISING OR BLEEDING ?*URINARY PROBLEMS (pain or burning when urinating, or frequent urination) ?*BOWEL PROBLEMS (unusual diarrhea, constipation, pain near the anus) ?TENDERNESS IN MOUTH AND THROAT WITH OR WITHOUT PRESENCE OF ULCERS (sore throat, sores in mouth, or a toothache) ?UNUSUAL RASH, SWELLING OR PAIN  ?UNUSUAL VAGINAL DISCHARGE OR ITCHING  ? ?Items with * indicate a potential emergency and should be followed up as soon as possible or go to the Emergency Department if any problems should occur. ? ?Please show the CHEMOTHERAPY ALERT CARD or IMMUNOTHERAPY ALERT CARD at check-in to the  Emergency Department and triage nurse. ?Should you have questions after your visit or need to cancel or reschedule your appointment, please contact [facility]  [phone] and follow the prompts.  Office hours are 8:00 a.m. to 4:30 p.m. Monday - Friday. Please note that voicemails left after 4:00 p.m. may not be returned until the following business day.  We are closed weekends and major holidays. You have access to a nurse at all times for urgent questions. Please call the main number to the clinic [phone] and follow the prompts. ? ?For any non-urgent questions, you may also contact your provider using MyChart. We now offer e-Visits for anyone 58 and older to request care online for non-urgent symptoms. For details visit [email or web address]. ?  ?Also download the MyChart app! Go to the app store, search "MyChart", open the app, select [facility], and log in with your MyChart username and password. ? ?Due to Covid, a mask is required upon entering the hospital/clinic. If you do not have a mask, one will be given to you upon arrival. For doctor visits, patients may have 1 support person aged 50 or older with them. For treatment visits, patients cannot have anyone with them due to current Covid guidelines and our immunocompromised population.  ?

## 2022-01-20 ENCOUNTER — Ambulatory Visit (HOSPITAL_COMMUNITY): Payer: Medicare Other

## 2022-01-20 ENCOUNTER — Encounter (HOSPITAL_COMMUNITY): Payer: Self-pay

## 2022-01-20 ENCOUNTER — Other Ambulatory Visit (HOSPITAL_COMMUNITY): Payer: Medicare Other

## 2022-01-20 LAB — IGG, IGA, IGM
IgA: 18 mg/dL — ABNORMAL LOW (ref 61–437)
IgG (Immunoglobin G), Serum: 313 mg/dL — ABNORMAL LOW (ref 603–1613)
IgM (Immunoglobulin M), Srm: 19 mg/dL — ABNORMAL LOW (ref 20–172)

## 2022-01-20 LAB — KAPPA/LAMBDA LIGHT CHAINS
Kappa free light chain: 5.6 mg/L (ref 3.3–19.4)
Kappa, lambda light chain ratio: 0.81 (ref 0.26–1.65)
Lambda free light chains: 6.9 mg/L (ref 5.7–26.3)

## 2022-01-21 LAB — PROTEIN ELECTROPHORESIS, SERUM, WITH REFLEX
A/G Ratio: 1.5 (ref 0.7–1.7)
Albumin ELP: 3.2 g/dL (ref 2.9–4.4)
Alpha-1-Globulin: 0.3 g/dL (ref 0.0–0.4)
Alpha-2-Globulin: 0.9 g/dL (ref 0.4–1.0)
Beta Globulin: 0.8 g/dL (ref 0.7–1.3)
Gamma Globulin: 0.3 g/dL — ABNORMAL LOW (ref 0.4–1.8)
Globulin, Total: 2.2 g/dL (ref 2.2–3.9)
Total Protein ELP: 5.4 g/dL — ABNORMAL LOW (ref 6.0–8.5)

## 2022-01-28 ENCOUNTER — Telehealth: Payer: Self-pay | Admitting: *Deleted

## 2022-01-28 NOTE — Telephone Encounter (Signed)
This nurse called Bruce Smith, spouse, and gave her information on appointments for the following week. Bruce Smith is to see Bruce Pascal, NP, for Bruce Smith on Tuesday, April 4th at 2:15pm. On Thursday, April, 6th, Bruce Smith will see Bruce Smith at [facility]. Bruce Smith is out of the office today, Monday and Tuesday. Wednesday he is in surgery. Thursday he will be in the office. Bruce Smith has sent in a prescription for a lumbarsacral corsett to [facility] on [address]. Phone # is [phone]. All of this information was given to Bruce Smith. I instructed her to call [facility] today in case an appointment was needed to be fitted for the corsett. Per Bruce Smith, the Tramadol and Tylenol are keeping the pain under control. He does not need any other pain medications at this time. Bruce Smith verbalized understanding of this conversation. Also, I faxed Bruce Smith last office notes and labs to Bruce Smith office. Fax # is [phone]. Received faxed confirmation. ?

## 2022-02-16 ENCOUNTER — Other Ambulatory Visit: Payer: Self-pay

## 2022-02-16 ENCOUNTER — Inpatient Hospital Stay: Payer: Medicare Other

## 2022-02-16 ENCOUNTER — Inpatient Hospital Stay: Payer: Medicare Other | Admitting: Hematology & Oncology

## 2022-02-16 ENCOUNTER — Inpatient Hospital Stay: Payer: Medicare Other | Attending: Hematology & Oncology

## 2022-02-16 ENCOUNTER — Encounter: Payer: Self-pay | Admitting: Hematology & Oncology

## 2022-02-16 VITALS — BP 126/57 | HR 55 | Temp 97.9°F | Resp 18 | Ht 68.0 in | Wt 196.1 lb

## 2022-02-16 DIAGNOSIS — C9000 Multiple myeloma not having achieved remission: Secondary | ICD-10-CM | POA: Diagnosis present

## 2022-02-16 DIAGNOSIS — Z5112 Encounter for antineoplastic immunotherapy: Secondary | ICD-10-CM | POA: Insufficient documentation

## 2022-02-16 DIAGNOSIS — C419 Malignant neoplasm of bone and articular cartilage, unspecified: Secondary | ICD-10-CM

## 2022-02-16 LAB — CBC WITH DIFFERENTIAL (CANCER CENTER ONLY)
Abs Immature Granulocytes: 0.05 10*3/uL (ref 0.00–0.07)
Basophils Absolute: 0 10*3/uL (ref 0.0–0.1)
Basophils Relative: 0 %
Eosinophils Absolute: 0 10*3/uL (ref 0.0–0.5)
Eosinophils Relative: 0 %
HCT: 36.7 % — ABNORMAL LOW (ref 39.0–52.0)
Hemoglobin: 12.1 g/dL — ABNORMAL LOW (ref 13.0–17.0)
Immature Granulocytes: 1 %
Lymphocytes Relative: 17 %
Lymphs Abs: 1.5 10*3/uL (ref 0.7–4.0)
MCH: 30.9 pg (ref 26.0–34.0)
MCHC: 33 g/dL (ref 30.0–36.0)
MCV: 93.9 fL (ref 80.0–100.0)
Monocytes Absolute: 1.3 10*3/uL — ABNORMAL HIGH (ref 0.1–1.0)
Monocytes Relative: 14 %
Neutro Abs: 6 10*3/uL (ref 1.7–7.7)
Neutrophils Relative %: 68 %
Platelet Count: 166 10*3/uL (ref 150–400)
RBC: 3.91 MIL/uL — ABNORMAL LOW (ref 4.22–5.81)
RDW: 15 % (ref 11.5–15.5)
WBC Count: 8.8 10*3/uL (ref 4.0–10.5)
nRBC: 0 % (ref 0.0–0.2)

## 2022-02-16 LAB — CMP (CANCER CENTER ONLY)
ALT: 40 U/L (ref 0–44)
AST: 28 U/L (ref 15–41)
Albumin: 3.8 g/dL (ref 3.5–5.0)
Alkaline Phosphatase: 81 U/L (ref 38–126)
Anion gap: 6 (ref 5–15)
BUN: 19 mg/dL (ref 8–23)
CO2: 27 mmol/L (ref 22–32)
Calcium: 9 mg/dL (ref 8.9–10.3)
Chloride: 108 mmol/L (ref 98–111)
Creatinine: 0.99 mg/dL (ref 0.61–1.24)
GFR, Estimated: >60 mL/min (ref >60)
Glucose, Bld: 66 mg/dL — ABNORMAL LOW (ref 70–99)
Potassium: 4.3 mmol/L (ref 3.5–5.1)
Sodium: 141 mmol/L (ref 135–145)
Total Bilirubin: 0.3 mg/dL (ref 0.3–1.2)
Total Protein: 5.7 g/dL — ABNORMAL LOW (ref 6.5–8.1)

## 2022-02-16 LAB — LACTATE DEHYDROGENASE: LDH: 144 U/L (ref 98–192)

## 2022-02-16 MED ORDER — DENOSUMAB 120 MG/1.7ML ~~LOC~~ SOLN
120.0000 mg | Freq: Once | SUBCUTANEOUS | Status: AC
  Administered 2022-02-16: 120 mg via SUBCUTANEOUS
  Filled 2022-02-16: qty 1.7

## 2022-02-16 MED ORDER — ACETAMINOPHEN 325 MG PO TABS: 650.0000 mg | ORAL_TABLET | Freq: Once | ORAL | Status: DC

## 2022-02-16 MED ORDER — FAMOTIDINE 20 MG PO TABS: 40.0000 mg | ORAL_TABLET | Freq: Once | ORAL | Status: DC

## 2022-02-16 MED ORDER — DARATUMUMAB-HYALURONIDASE-FIHJ 1800-30000 MG-UT/15ML ~~LOC~~ SOLN
1800.0000 mg | Freq: Once | SUBCUTANEOUS | Status: AC
  Administered 2022-02-16: 1800 mg via SUBCUTANEOUS
  Filled 2022-02-16: qty 15

## 2022-02-16 MED ORDER — DIPHENHYDRAMINE HCL 25 MG PO CAPS: 50.0000 mg | ORAL_CAPSULE | Freq: Once | ORAL | Status: DC

## 2022-02-16 NOTE — Patient Instructions (Signed)
[facility] [place]  Discharge Instructions: ?Thank you for choosing [facility] to provide your oncology and hematology care.  ? ?If you have a lab appointment with the [facility], please go directly to the [facility] and check in at the registration area. ? ?Wear comfortable clothing and clothing appropriate for easy access to any Portacath or PICC line.  ? ?We strive to give you quality time with your provider. You may need to reschedule your appointment if you arrive late (15 or more minutes).  Arriving late affects you and other patients whose appointments are after yours.  Also, if you miss three or more appointments without notifying the office, you may be dismissed from the clinic at the provider?s discretion.    ?  ?For prescription refill requests, have your pharmacy contact our office and allow 72 hours for refills to be completed.   ? ?Today you received the following chemotherapy and/or immunotherapy agents Darzalex  ?  ?To help prevent nausea and vomiting after your treatment, we encourage you to take your nausea medication as directed. ? ?BELOW ARE SYMPTOMS THAT SHOULD BE REPORTED IMMEDIATELY: ?*FEVER GREATER THAN 100.4 F (38 ?C) OR HIGHER ?*CHILLS OR SWEATING ?*NAUSEA AND VOMITING THAT IS NOT CONTROLLED WITH YOUR NAUSEA MEDICATION ?*UNUSUAL SHORTNESS OF BREATH ?*UNUSUAL BRUISING OR BLEEDING ?*URINARY PROBLEMS (pain or burning when urinating, or frequent urination) ?*BOWEL PROBLEMS (unusual diarrhea, constipation, pain near the anus) ?TENDERNESS IN MOUTH AND THROAT WITH OR WITHOUT PRESENCE OF ULCERS (sore throat, sores in mouth, or a toothache) ?UNUSUAL RASH, SWELLING OR PAIN  ?UNUSUAL VAGINAL DISCHARGE OR ITCHING  ? ?Items with * indicate a potential emergency and should be followed up as soon as possible or go to the Emergency Department if any problems should occur. ? ?Please show the CHEMOTHERAPY ALERT CARD or IMMUNOTHERAPY ALERT CARD at check-in to the  Emergency Department and triage nurse. ?Should you have questions after your visit or need to cancel or reschedule your appointment, please contact [facility]  [phone] and follow the prompts.  Office hours are 8:00 a.m. to 4:30 p.m. Monday - Friday. Please note that voicemails left after 4:00 p.m. may not be returned until the following business day.  We are closed weekends and major holidays. You have access to a nurse at all times for urgent questions. Please call the main number to the clinic [phone] and follow the prompts. ? ?For any non-urgent questions, you may also contact your provider using MyChart. We now offer e-Visits for anyone 27 and older to request care online for non-urgent symptoms. For details visit [email or web address]. ?  ?Also download the MyChart app! Go to the app store, search "MyChart", open the app, select [facility], and log in with your MyChart username and password. ? ?Due to Covid, a mask is required upon entering the hospital/clinic. If you do not have a mask, one will be given to you upon arrival. For doctor visits, patients may have 1 support person aged 54 or older with them. For treatment visits, patients cannot have anyone with them due to current Covid guidelines and our immunocompromised population.  ?

## 2022-02-16 NOTE — Progress Notes (Signed)
?Hematology and Oncology Follow Up Visit ? ?Bruce Smith ?[id number] ?[birth year] 71 y.o. ?02/16/2022 ? ? 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 ?Past Therapy: ?Status post surgical repair of left femur-07/14/2020 ?XRT to left shoulder and left hip ?Xgeva 120 mg subcu every 3 months-next dose in December 2022  ?Faspro/Velcade/Revlimid/Decadron -- s/p cycle #2 -- start on       08/06/2020  ( Revlimid is 14 on /14 off)  ?  ?Current Therapy:        ?ASCT -- [facility] on -01/22/2021 ?Faspro - q month -- maintenance -- start on 05/13/2021 ?Xgeva 120 mg subcu every 3 months-next dose in July 2023 ?  ?Interim History:  Bruce Smith is here today with his wife for follow-up.  I am incredibly impressed that he is here.  He had lumbar laminectomy 2 days ago.  This was done as an outpatient.  I am very amazed that he mated in.  The surgery seemed to help.  He is not having the reticular apathy down the right leg.  I am unsure exactly what he had done surgery wise.  We will have to see if we can get the operative report. ? ?Otherwise, he is doing nicely.  He did have a very nice [place] with his family. ? ?He has had no problems with bowels or bladder.  He has had no cough or shortness of breath.  He has had no headache. ? ?His appetite has been quite good.  In fact, right after his surgery, he went out to eat. ? ?There has been no evidence of abnormal monoclonal spike in his blood.  His last Kappa light chain back in March was 0.6 mg/dL. ? ?He has had no fever.  There is been no bleeding. ? ?He still has the neuropathy.  He is on Lyrica for this. ? ?Overall, his performance status is probably ECOG 1.    ? ?Medications:  ?Allergies as of 02/16/2022   ?No Known Allergies ?  ? ?  ?Medication List  ?  ? ?  ? Accurate as of February 16, 2022  9:00 AM. If you have any questions, ask your nurse or doctor.  ?  ?  ? ?  ? ?acetaminophen 500 MG  tablet ?Commonly known as: TYLENOL ?Take 1 tablet (500 mg total) by mouth every 6 (six) hours. ?  ?acyclovir 400 MG tablet ?Commonly known as: ZOVIRAX ?Take 1 tablet (400 mg total) by mouth 2 (two) times daily. ?  ?aspirin 81 MG EC tablet ?Take by mouth. ?  ?benzonatate 200 MG capsule ?Commonly known as: TESSALON ?Take 200 mg by mouth 3 (three) times daily as needed. ?  ?calcium carbonate 1250 (500 Ca) MG tablet ?Commonly known as: OS-CAL - dosed in mg of elemental calcium ?Take 1 tablet (500 mg of elemental calcium total) by mouth 2 (two) times daily with a meal. ?  ?celecoxib 100 MG capsule ?Commonly known as: CELEBREX ?TAKE 1 CAPSULE BY MOUTH 2 TIMES DAILY WITH FOOD AS NEEDED FOR PAIN & INFLAMMATION ?  ?dronabinol 5 MG capsule ?Commonly known as: MARINOL ?TAKE 1 CAPSULE BY MOUTH TWICE DAILY BEFORE A MEAL ?  ?Florastor 250 MG capsule ?Generic drug: saccharomyces boulardii ?Take 250 mg by mouth daily. ?  ?levETIRAcetam 500 MG tablet ?Commonly known as: KEPPRA ?Take 1 tablet (500 mg total) by mouth 2 (two) times daily. ?  ?loperamide 2 MG capsule ?Commonly known as: IMODIUM ?  Take 1 capsule (2 mg total) by mouth as needed for diarrhea or loose stools. ?  ?MULTIVITAMIN MEN 50+ PO ?Take 1 tablet by mouth daily. ?  ?mupirocin ointment 2 % ?Commonly known as: BACTROBAN ?Apply topically daily. ?  ?ondansetron 8 MG tablet ?Commonly known as: ZOFRAN ?TAKE 1 TABLET BY MOUTH EVERY 8 HOURS AS NEEDED NAUSEA AND VOMITING ?  ?pantoprazole 40 MG tablet ?Commonly known as: PROTONIX ?TAKE 1 TABLET BY MOUTH 2 TIMES A DAY BEFORE MEALS FOR ACID REFLUX ?  ?polyethylene glycol powder 17 GM/SCOOP powder ?Commonly known as: GLYCOLAX/MIRALAX ?Take by mouth. ?  ?pregabalin 200 MG capsule ?Commonly known as: LYRICA ?Take 1 capsule (200 mg total) by mouth 2 (two) times daily. ?  ?tamsulosin 0.4 MG Caps capsule ?Commonly known as: FLOMAX ?TAKE ONE CAPSULE BY MOUTH AT BEDTIME ?  ?traMADol 50 MG tablet ?Commonly known as: ULTRAM ?TAKE TWO TABLETS  BY MOUTH EVERY 6 HOURS AS NEEDED FOR PAIN ?  ? ?  ? ? ?Allergies: No Known Allergies ? ?Past Medical History, Surgical history, Social history, and Family History were reviewed and updated. ? ?Review of Systems: ?Review of Systems  ?Constitutional: Negative.   ?HENT: Negative.    ?Eyes: Negative.   ?Respiratory: Negative.    ?Cardiovascular: Negative.   ?Gastrointestinal: Negative.   ?Genitourinary: Negative.   ?Musculoskeletal:  Positive for joint pain.  ?Skin: Negative.   ?Neurological: Negative.   ?Endo/Heme/Allergies: Negative.   ?Psychiatric/Behavioral: Negative.    ? ? ?Physical Exam: ? vitals were not taken for this visit.  ? ?Wt Readings from Last 3 Encounters:  ?01/19/22 191 lb (86.6 kg)  ?12/22/21 187 lb 12.8 oz (85.2 kg)  ?11/24/21 187 lb 1.9 oz (84.9 kg)  ? ? ?Physical Exam ?Vitals reviewed.  ?HENT:  ?   Head: Normocephalic and atraumatic.  ?Eyes:  ?   Pupils: Pupils are equal, round, and reactive to light.  ?Cardiovascular:  ?   Rate and Rhythm: Normal rate and regular rhythm.  ?   Heart sounds: Normal heart sounds.  ?Pulmonary:  ?   Effort: Pulmonary effort is normal.  ?   Breath sounds: Normal breath sounds.  ?Abdominal:  ?   General: Bowel sounds are normal.  ?   Palpations: Abdomen is soft.  ?Musculoskeletal:     ?   General: No tenderness or deformity. Normal range of motion.  ?   Cervical back: Normal range of motion.  ?Lymphadenopathy:  ?   Cervical: No cervical adenopathy.  ?Skin: ?   General: Skin is warm and dry.  ?   Findings: No erythema or rash.  ?Neurological:  ?   Mental Status: He is alert and oriented to person, place, and time.  ?Psychiatric:     ?   Behavior: Behavior normal.     ?   Thought Content: Thought content normal.     ?   Judgment: Judgment normal.  ? ? ? ?Lab Results  ?Component Value Date  ? WBC 8.8 02/16/2022  ? HGB 12.1 (L) 02/16/2022  ? HCT 36.7 (L) 02/16/2022  ? MCV 93.9 02/16/2022  ? PLT 166 02/16/2022  ? ?Lab Results  ?Component Value Date  ? FERRITIN 938 (H)  02/23/2021  ? IRON 67 02/23/2021  ? TIBC 156 (L) 02/23/2021  ? UIBC 90 (L) 02/23/2021  ? IRONPCTSAT 43 02/23/2021  ? ?Lab Results  ?Component Value Date  ? RBC 3.91 (L) 02/16/2022  ? ?Lab Results  ?Component Value Date  ? KPAFRELGTCHN 5.6 01/19/2022  ?  LAMBDASER 6.9 01/19/2022  ? KAPLAMBRATIO 0.81 01/19/2022  ? ?Lab Results  ?Component Value Date  ? IGGSERUM 313 (L) 01/19/2022  ? IGA 18 (L) 01/19/2022  ? IGMSERUM 19 (L) 01/19/2022  ? ?Lab Results  ?Component Value Date  ? TOTALPROTELP 5.4 (L) 01/19/2022  ? ALBUMINELP 3.2 01/19/2022  ? A1GS 0.3 01/19/2022  ? A2GS 0.9 01/19/2022  ? BETS 0.8 01/19/2022  ? GAMS 0.3 (L) 01/19/2022  ? MSPIKE Not Observed 01/19/2022  ? ?  Chemistry   ?   ?Component Value Date/Time  ? NA 140 01/19/2022 0819  ? K 4.2 01/19/2022 0819  ? CL 106 01/19/2022 0819  ? CO2 27 01/19/2022 0819  ? BUN 20 01/19/2022 0819  ? CREATININE 0.84 01/19/2022 0819  ?    ?Component Value Date/Time  ? CALCIUM 9.6 01/19/2022 0819  ? ALKPHOS 137 (H) 01/19/2022 6789  ? AST 17 01/19/2022 0819  ? ALT 29 01/19/2022 0819  ? BILITOT 0.3 01/19/2022 0819  ?  ? ? ? ?Impression and Plan: Bruce Smith is a very pleasant 71 yo gentleman with 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  ? ?I am so amazed that he came in today.  He just had back surgery 2 days ago. ? ?He will get his Delton See today. ? ?Hopefully, at some point, we will be able to move his [name] out. ? ?We will plan to see him back in another 4 weeks. ? ?Volanda Napoleon, MD ?4/19/20239:00 AM ?

## 2022-02-17 LAB — KAPPA/LAMBDA LIGHT CHAINS
Kappa free light chain: 4.7 mg/L (ref 3.3–19.4)
Kappa, lambda light chain ratio: 1.62 (ref 0.26–1.65)
Lambda free light chains: 2.9 mg/L — ABNORMAL LOW (ref 5.7–26.3)

## 2022-02-17 LAB — IGG, IGA, IGM
IgA: 13 mg/dL — ABNORMAL LOW (ref 61–437)
IgG (Immunoglobin G), Serum: 250 mg/dL — ABNORMAL LOW (ref 603–1613)
IgM (Immunoglobulin M), Srm: 9 mg/dL — ABNORMAL LOW (ref 20–172)

## 2022-02-18 LAB — PROTEIN ELECTROPHORESIS, SERUM, WITH REFLEX
A/G Ratio: 2 — ABNORMAL HIGH (ref 0.7–1.7)
Albumin ELP: 3.4 g/dL (ref 2.9–4.4)
Alpha-1-Globulin: 0.2 g/dL (ref 0.0–0.4)
Alpha-2-Globulin: 0.7 g/dL (ref 0.4–1.0)
Beta Globulin: 0.6 g/dL — ABNORMAL LOW (ref 0.7–1.3)
Gamma Globulin: 0.2 g/dL — ABNORMAL LOW (ref 0.4–1.8)
Globulin, Total: 1.7 g/dL — ABNORMAL LOW (ref 2.2–3.9)
Total Protein ELP: 5.1 g/dL — ABNORMAL LOW (ref 6.0–8.5)

## 2022-02-21 ENCOUNTER — Other Ambulatory Visit: Payer: Self-pay | Admitting: Hematology & Oncology

## 2022-02-22 ENCOUNTER — Other Ambulatory Visit: Payer: Self-pay | Admitting: Hematology & Oncology

## 2022-02-24 ENCOUNTER — Other Ambulatory Visit: Payer: Self-pay | Admitting: Hematology & Oncology

## 2022-02-24 ENCOUNTER — Other Ambulatory Visit: Payer: Self-pay

## 2022-02-24 MED ORDER — PREGABALIN 200 MG PO CAPS: 200.0000 mg | ORAL_CAPSULE | Freq: Two times a day (BID) | ORAL | 1 refills | Status: DC

## 2022-03-16 ENCOUNTER — Inpatient Hospital Stay: Payer: Medicare Other | Admitting: Hematology & Oncology

## 2022-03-16 ENCOUNTER — Encounter: Payer: Self-pay | Admitting: Hematology & Oncology

## 2022-03-16 ENCOUNTER — Other Ambulatory Visit: Payer: Self-pay

## 2022-03-16 ENCOUNTER — Inpatient Hospital Stay: Payer: Medicare Other | Attending: Hematology & Oncology

## 2022-03-16 ENCOUNTER — Inpatient Hospital Stay: Payer: Medicare Other

## 2022-03-16 VITALS — BP 135/68 | HR 56 | Temp 97.7°F | Resp 18 | Ht 68.0 in | Wt 189.0 lb

## 2022-03-16 DIAGNOSIS — C9000 Multiple myeloma not having achieved remission: Secondary | ICD-10-CM | POA: Diagnosis present

## 2022-03-16 DIAGNOSIS — Z5112 Encounter for antineoplastic immunotherapy: Secondary | ICD-10-CM | POA: Insufficient documentation

## 2022-03-16 DIAGNOSIS — C419 Malignant neoplasm of bone and articular cartilage, unspecified: Secondary | ICD-10-CM

## 2022-03-16 LAB — CBC WITH DIFFERENTIAL (CANCER CENTER ONLY)
Abs Immature Granulocytes: 0.06 10*3/uL (ref 0.00–0.07)
Basophils Absolute: 0 10*3/uL (ref 0.0–0.1)
Basophils Relative: 0 %
Eosinophils Absolute: 0.2 10*3/uL (ref 0.0–0.5)
Eosinophils Relative: 3 %
HCT: 37.8 % — ABNORMAL LOW (ref 39.0–52.0)
Hemoglobin: 12 g/dL — ABNORMAL LOW (ref 13.0–17.0)
Immature Granulocytes: 1 %
Lymphocytes Relative: 21 %
Lymphs Abs: 1.2 10*3/uL (ref 0.7–4.0)
MCH: 30.7 pg (ref 26.0–34.0)
MCHC: 31.7 g/dL (ref 30.0–36.0)
MCV: 96.7 fL (ref 80.0–100.0)
Monocytes Absolute: 0.8 10*3/uL (ref 0.1–1.0)
Monocytes Relative: 14 %
Neutro Abs: 3.4 10*3/uL (ref 1.7–7.7)
Neutrophils Relative %: 61 %
Platelet Count: 191 10*3/uL (ref 150–400)
RBC: 3.91 MIL/uL — ABNORMAL LOW (ref 4.22–5.81)
RDW: 16.4 % — ABNORMAL HIGH (ref 11.5–15.5)
WBC Count: 5.5 10*3/uL (ref 4.0–10.5)
nRBC: 0 % (ref 0.0–0.2)

## 2022-03-16 LAB — CMP (CANCER CENTER ONLY)
ALT: 31 U/L (ref 0–44)
AST: 12 U/L — ABNORMAL LOW (ref 15–41)
Albumin: 3.7 g/dL (ref 3.5–5.0)
Alkaline Phosphatase: 88 U/L (ref 38–126)
Anion gap: 5 (ref 5–15)
BUN: 21 mg/dL (ref 8–23)
CO2: 28 mmol/L (ref 22–32)
Calcium: 9 mg/dL (ref 8.9–10.3)
Chloride: 106 mmol/L (ref 98–111)
Creatinine: 0.9 mg/dL (ref 0.61–1.24)
GFR, Estimated: >60 mL/min (ref >60)
Glucose, Bld: 91 mg/dL (ref 70–99)
Potassium: 4.6 mmol/L (ref 3.5–5.1)
Sodium: 139 mmol/L (ref 135–145)
Total Bilirubin: 0.4 mg/dL (ref 0.3–1.2)
Total Protein: 5.5 g/dL — ABNORMAL LOW (ref 6.5–8.1)

## 2022-03-16 LAB — LACTATE DEHYDROGENASE: LDH: 132 U/L (ref 98–192)

## 2022-03-16 MED ORDER — DARATUMUMAB-HYALURONIDASE-FIHJ 1800-30000 MG-UT/15ML ~~LOC~~ SOLN
1800.0000 mg | Freq: Once | SUBCUTANEOUS | Status: AC
  Administered 2022-03-16: 1800 mg via SUBCUTANEOUS
  Filled 2022-03-16: qty 15

## 2022-03-16 NOTE — Progress Notes (Signed)
?Hematology and Oncology Follow Up Visit ? ?Bruce Smith ?[id number] ?[birth year] 70 y.o. ?03/16/2022 ? ? 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 ?Past Therapy: ?Status post surgical repair of left femur-07/14/2020 ?XRT to left shoulder and left hip ?Xgeva 120 mg subcu every 3 months-next dose in December 2022  ?Faspro/Velcade/Revlimid/Decadron -- s/p cycle #2 -- start on       08/06/2020  ( Revlimid is 14 on /14 off)  ?  ?Current Therapy:        ?ASCT -- [facility] on -01/22/2021 ?Faspro - q month -- maintenance -- start on 05/13/2021 ?Xgeva 120 mg subcu every 3 months-next dose in July 2023 ?  ?Interim History:  Bruce Smith is here today with his wife for follow-up.  Bruce Smith is doing much better.  His back is doing much better since Bruce Smith had surgery. ? ?Bruce Smith and his wife will be going to [facility] over [place] Day weekend.  I am sure that they will have a wonderful time. ? ?Bruce Smith has little bit of swelling in the legs.  This is chronic.  This might be related to medication. ? ?Bruce Smith has had no problems with bowels or bladder. ? ?Bruce Smith is eating well.  Bruce Smith has had no nausea or vomiting. ? ?Bruce Smith has had no problems with cough or shortness of breath.  Bruce Smith had recently had a bout of bronchitis.  Bruce Smith was on antibiotics for this. ? ?Bruce Smith has had no issues with headache. ? ?His last monoclonal studies back in April did not show monoclonal spike in his blood.  His IgG level was 250 mg/dL.  This is on the low side.  We will have to watch closely to make sure that we do not have to start giving him IVIG if Bruce Smith has problems with recurrent infections. ? ?His Kappa light chain was 0.5 mg/dL. ? ?Overall, I would say that his performance status is ECOG 1. ? ? ?Medications:  ?Allergies as of 03/16/2022   ?No Known Allergies ?  ? ?  ?Medication List  ?  ? ?  ? Accurate as of Mar 16, 2022  9:01 AM. If you have any questions, ask your nurse or doctor.  ?  ?   ? ?  ? ?acetaminophen 500 MG tablet ?Commonly known as: TYLENOL ?Take 1 tablet (500 mg total) by mouth every 6 (six) hours. ?  ?acyclovir 400 MG tablet ?Commonly known as: ZOVIRAX ?Take 1 tablet (400 mg total) by mouth 2 (two) times daily. ?  ?aspirin 81 MG EC tablet ?Take by mouth. ?  ?benzonatate 200 MG capsule ?Commonly known as: TESSALON ?Take 200 mg by mouth 3 (three) times daily as needed. ?  ?calcium carbonate 1250 (500 Ca) MG tablet ?Commonly known as: OS-CAL - dosed in mg of elemental calcium ?Take 1 tablet (500 mg of elemental calcium total) by mouth 2 (two) times daily with a meal. ?  ?celecoxib 100 MG capsule ?Commonly known as: CELEBREX ?TAKE 1 CAPSULE BY MOUTH 2 TIMES DAILY WITH FOOD AS NEEDED FOR PAIN & INFLAMMATION ?  ?dronabinol 5 MG capsule ?Commonly known as: MARINOL ?TAKE 1 CAPSULE BY MOUTH TWICE DAILY BEFORE A MEAL ?  ?Florastor 250 MG capsule ?Generic drug: saccharomyces boulardii ?Take 250 mg by mouth daily. ?  ?levETIRAcetam 500 MG tablet ?Commonly known as: KEPPRA ?Take 1 tablet (500 mg total) by mouth 2 (two) times daily. ?  ?loperamide 2 MG capsule ?Commonly known as:  IMODIUM ?Take 1 capsule (2 mg total) by mouth as needed for diarrhea or loose stools. ?  ?MULTIVITAMIN MEN 50+ PO ?Take 1 tablet by mouth daily. ?  ?mupirocin ointment 2 % ?Commonly known as: BACTROBAN ?Apply topically daily. ?  ?ondansetron 8 MG tablet ?Commonly known as: ZOFRAN ?TAKE 1 TABLET BY MOUTH EVERY 8 HOURS AS NEEDED NAUSEA AND VOMITING ?  ?pantoprazole 40 MG tablet ?Commonly known as: PROTONIX ?TAKE 1 TABLET BY MOUTH 2 TIMES A DAY BEFORE MEALS FOR ACID REFLUX ?  ?polyethylene glycol powder 17 GM/SCOOP powder ?Commonly known as: GLYCOLAX/MIRALAX ?Take by mouth. ?  ?pregabalin 150 MG capsule ?Commonly known as: LYRICA ?TAKE 1 CAPSULE BY MOUTH 2 TIMES DAILY ?  ?pregabalin 200 MG capsule ?Commonly known as: LYRICA ?Take 1 capsule (200 mg total) by mouth 2 (two) times daily. ?  ?tamsulosin 0.4 MG Caps capsule ?Commonly  known as: FLOMAX ?TAKE ONE CAPSULE BY MOUTH AT BEDTIME ?  ?traMADol 50 MG tablet ?Commonly known as: ULTRAM ?TAKE TWO TABLETS BY MOUTH EVERY 6 HOURS AS NEEDED FOR PAIN ?  ? ?  ? ? ?Allergies: No Known Allergies ? ?Past Medical History, Surgical history, Social history, and Family History were reviewed and updated. ? ?Review of Systems: ?Review of Systems  ?Constitutional: Negative.   ?HENT: Negative.    ?Eyes: Negative.   ?Respiratory: Negative.    ?Cardiovascular: Negative.   ?Gastrointestinal: Negative.   ?Genitourinary: Negative.   ?Musculoskeletal:  Positive for joint pain.  ?Skin: Negative.   ?Neurological: Negative.   ?Endo/Heme/Allergies: Negative.   ?Psychiatric/Behavioral: Negative.    ? ? ?Physical Exam: ? vitals were not taken for this visit.  ? ?Wt Readings from Last 3 Encounters:  ?02/16/22 196 lb 1.9 oz (89 kg)  ?01/19/22 191 lb (86.6 kg)  ?12/22/21 187 lb 12.8 oz (85.2 kg)  ? ? ?Physical Exam ?Vitals reviewed.  ?HENT:  ?   Head: Normocephalic and atraumatic.  ?Eyes:  ?   Pupils: Pupils are equal, round, and reactive to light.  ?Cardiovascular:  ?   Rate and Rhythm: Normal rate and regular rhythm.  ?   Heart sounds: Normal heart sounds.  ?Pulmonary:  ?   Effort: Pulmonary effort is normal.  ?   Breath sounds: Normal breath sounds.  ?Abdominal:  ?   General: Bowel sounds are normal.  ?   Palpations: Abdomen is soft.  ?Musculoskeletal:     ?   General: No tenderness or deformity. Normal range of motion.  ?   Cervical back: Normal range of motion.  ?Lymphadenopathy:  ?   Cervical: No cervical adenopathy.  ?Skin: ?   General: Skin is warm and dry.  ?   Findings: No erythema or rash.  ?Neurological:  ?   Mental Status: Bruce Smith is alert and oriented to person, place, and time.  ?Psychiatric:     ?   Behavior: Behavior normal.     ?   Thought Content: Thought content normal.     ?   Judgment: Judgment normal.  ? ? ? ?Lab Results  ?Component Value Date  ? WBC 5.5 03/16/2022  ? HGB 12.0 (L) 03/16/2022  ? HCT 37.8  (L) 03/16/2022  ? MCV 96.7 03/16/2022  ? PLT 191 03/16/2022  ? ?Lab Results  ?Component Value Date  ? FERRITIN 938 (H) 02/23/2021  ? IRON 67 02/23/2021  ? TIBC 156 (L) 02/23/2021  ? UIBC 90 (L) 02/23/2021  ? IRONPCTSAT 43 02/23/2021  ? ?Lab Results  ?Component Value Date  ?  RBC 3.91 (L) 03/16/2022  ? ?Lab Results  ?Component Value Date  ? KPAFRELGTCHN 4.7 02/16/2022  ? LAMBDASER 2.9 (L) 02/16/2022  ? KAPLAMBRATIO 1.62 02/16/2022  ? ?Lab Results  ?Component Value Date  ? IGGSERUM 250 (L) 02/16/2022  ? IGA 13 (L) 02/16/2022  ? IGMSERUM 9 (L) 02/16/2022  ? ?Lab Results  ?Component Value Date  ? TOTALPROTELP 5.1 (L) 02/16/2022  ? ALBUMINELP 3.4 02/16/2022  ? A1GS 0.2 02/16/2022  ? A2GS 0.7 02/16/2022  ? BETS 0.6 (L) 02/16/2022  ? GAMS 0.2 (L) 02/16/2022  ? MSPIKE Not Observed 02/16/2022  ? ?  Chemistry   ?   ?Component Value Date/Time  ? NA 141 02/16/2022 0838  ? K 4.3 02/16/2022 0838  ? CL 108 02/16/2022 0838  ? CO2 27 02/16/2022 0838  ? BUN 19 02/16/2022 0838  ? CREATININE 0.99 02/16/2022 0838  ?    ?Component Value Date/Time  ? CALCIUM 9.0 02/16/2022 0838  ? ALKPHOS 81 02/16/2022 0838  ? AST 28 02/16/2022 0838  ? ALT 40 02/16/2022 0838  ? BILITOT 0.3 02/16/2022 0838  ?  ? ? ? ?Impression and Plan: Bruce Smith is a very pleasant 71 yo gentleman with 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  ? ?I am happy that Bruce Smith is doing so well. ? ?Bruce Smith will get his Faspro today. ? ?We will have to watch for any infection.  Again his IgG level is on the lower side. ? ?We will plan to have him come back to see [place] in another month. ? ? ?Volanda Napoleon, MD ?5/17/20239:01 AM ?

## 2022-03-16 NOTE — Patient Instructions (Signed)
[facility]  Discharge Instructions: ?Thank you for choosing [facility] to provide your oncology and hematology care.  ? ?If you have a lab appointment with the [facility], please go directly to the [facility] and check in at the registration area. ? ?Wear comfortable clothing and clothing appropriate for easy access to any Portacath or PICC line.  ? ?We strive to give you quality time with your provider. You may need to reschedule your appointment if you arrive late (15 or more minutes).  Arriving late affects you and other patients whose appointments are after yours.  Also, if you miss three or more appointments without notifying the office, you may be dismissed from the clinic at the provider?s discretion.    ?  ?For prescription refill requests, have your pharmacy contact our office and allow 72 hours for refills to be completed.   ? ?Today you received the following chemotherapy and/or immunotherapy agents darzalex   ?  ?To help prevent nausea and vomiting after your treatment, we encourage you to take your nausea medication as directed. ? ?BELOW ARE SYMPTOMS THAT SHOULD BE REPORTED IMMEDIATELY: ?*FEVER GREATER THAN 100.4 F (38 ?C) OR HIGHER ?*CHILLS OR SWEATING ?*NAUSEA AND VOMITING THAT IS NOT CONTROLLED WITH YOUR NAUSEA MEDICATION ?*UNUSUAL SHORTNESS OF BREATH ?*UNUSUAL BRUISING OR BLEEDING ?*URINARY PROBLEMS (pain or burning when urinating, or frequent urination) ?*BOWEL PROBLEMS (unusual diarrhea, constipation, pain near the anus) ?TENDERNESS IN MOUTH AND THROAT WITH OR WITHOUT PRESENCE OF ULCERS (sore throat, sores in mouth, or a toothache) ?UNUSUAL RASH, SWELLING OR PAIN  ?UNUSUAL VAGINAL DISCHARGE OR ITCHING  ? ?Items with * indicate a potential emergency and should be followed up as soon as possible or go to the Emergency Department if any problems should occur. ? ?Please show the CHEMOTHERAPY ALERT CARD or IMMUNOTHERAPY ALERT CARD at check-in to the  Emergency Department and triage nurse. ?Should you have questions after your visit or need to cancel or reschedule your appointment, please contact [facility]  [phone] and follow the prompts.  Office hours are 8:00 a.m. to 4:30 p.m. Monday - Friday. Please note that voicemails left after 4:00 p.m. may not be returned until the following business day.  We are closed weekends and major holidays. You have access to a nurse at all times for urgent questions. Please call the main number to the clinic [phone] and follow the prompts. ? ?For any non-urgent questions, you may also contact your provider using MyChart. We now offer e-Visits for anyone 56 and older to request care online for non-urgent symptoms. For details visit [email or web address]. ?  ?Also download the MyChart app! Go to the app store, search "MyChart", open the app, select [facility], and log in with your MyChart username and password. ? ?Due to Covid, a mask is required upon entering the hospital/clinic. If you do not have a mask, one will be given to you upon arrival. For doctor visits, patients may have 1 support person aged 51 or older with them. For treatment visits, patients cannot have anyone with them due to current Covid guidelines and our immunocompromised population.  ?

## 2022-03-17 LAB — KAPPA/LAMBDA LIGHT CHAINS
Kappa free light chain: 6.6 mg/L (ref 3.3–19.4)
Kappa, lambda light chain ratio: 1 (ref 0.26–1.65)
Lambda free light chains: 6.6 mg/L (ref 5.7–26.3)

## 2022-03-17 LAB — IGG, IGA, IGM
IgA: 18 mg/dL — ABNORMAL LOW (ref 61–437)
IgG (Immunoglobin G), Serum: 270 mg/dL — ABNORMAL LOW (ref 603–1613)
IgM (Immunoglobulin M), Srm: 13 mg/dL — ABNORMAL LOW (ref 20–172)

## 2022-03-18 LAB — PROTEIN ELECTROPHORESIS, SERUM, WITH REFLEX
A/G Ratio: 1.9 — ABNORMAL HIGH (ref 0.7–1.7)
Albumin ELP: 3.3 g/dL (ref 2.9–4.4)
Alpha-1-Globulin: 0.2 g/dL (ref 0.0–0.4)
Alpha-2-Globulin: 0.8 g/dL (ref 0.4–1.0)
Beta Globulin: 0.6 g/dL — ABNORMAL LOW (ref 0.7–1.3)
Gamma Globulin: 0.2 g/dL — ABNORMAL LOW (ref 0.4–1.8)
Globulin, Total: 1.7 g/dL — ABNORMAL LOW (ref 2.2–3.9)
Total Protein ELP: 5 g/dL — ABNORMAL LOW (ref 6.0–8.5)

## 2022-03-29 ENCOUNTER — Other Ambulatory Visit: Payer: Self-pay | Admitting: Hematology & Oncology

## 2022-04-02 ENCOUNTER — Other Ambulatory Visit: Payer: Self-pay | Admitting: Hematology & Oncology

## 2022-04-02 DIAGNOSIS — E440 Moderate protein-calorie malnutrition: Secondary | ICD-10-CM

## 2022-04-02 DIAGNOSIS — C9000 Multiple myeloma not having achieved remission: Secondary | ICD-10-CM

## 2022-04-02 DIAGNOSIS — R627 Adult failure to thrive: Secondary | ICD-10-CM

## 2022-04-04 ENCOUNTER — Encounter: Payer: Self-pay | Admitting: Hematology & Oncology

## 2022-04-13 ENCOUNTER — Encounter: Payer: Self-pay | Admitting: Hematology & Oncology

## 2022-04-13 ENCOUNTER — Inpatient Hospital Stay: Payer: Medicare Other | Attending: Hematology & Oncology

## 2022-04-13 ENCOUNTER — Other Ambulatory Visit: Payer: Self-pay | Admitting: Oncology

## 2022-04-13 ENCOUNTER — Other Ambulatory Visit: Payer: Self-pay | Admitting: Hematology & Oncology

## 2022-04-13 ENCOUNTER — Inpatient Hospital Stay (HOSPITAL_BASED_OUTPATIENT_CLINIC_OR_DEPARTMENT_OTHER): Payer: Medicare Other | Admitting: Hematology & Oncology

## 2022-04-13 ENCOUNTER — Inpatient Hospital Stay: Payer: Medicare Other

## 2022-04-13 VITALS — BP 141/60 | HR 58 | Temp 98.1°F | Resp 20 | Wt 191.4 lb

## 2022-04-13 DIAGNOSIS — Z5112 Encounter for antineoplastic immunotherapy: Secondary | ICD-10-CM | POA: Diagnosis present

## 2022-04-13 DIAGNOSIS — C9000 Multiple myeloma not having achieved remission: Secondary | ICD-10-CM

## 2022-04-13 LAB — CBC WITH DIFFERENTIAL (CANCER CENTER ONLY)
Abs Immature Granulocytes: 0.08 10*3/uL — ABNORMAL HIGH (ref 0.00–0.07)
Basophils Absolute: 0 10*3/uL (ref 0.0–0.1)
Basophils Relative: 0 %
Eosinophils Absolute: 0.1 10*3/uL (ref 0.0–0.5)
Eosinophils Relative: 2 %
HCT: 40.5 % (ref 39.0–52.0)
Hemoglobin: 13.3 g/dL (ref 13.0–17.0)
Immature Granulocytes: 1 %
Lymphocytes Relative: 17 %
Lymphs Abs: 1.1 10*3/uL (ref 0.7–4.0)
MCH: 31.7 pg (ref 26.0–34.0)
MCHC: 32.8 g/dL (ref 30.0–36.0)
MCV: 96.7 fL (ref 80.0–100.0)
Monocytes Absolute: 0.8 10*3/uL (ref 0.1–1.0)
Monocytes Relative: 13 %
Neutro Abs: 4.2 10*3/uL (ref 1.7–7.7)
Neutrophils Relative %: 67 %
Platelet Count: 171 10*3/uL (ref 150–400)
RBC: 4.19 MIL/uL — ABNORMAL LOW (ref 4.22–5.81)
RDW: 16 % — ABNORMAL HIGH (ref 11.5–15.5)
WBC Count: 6.2 10*3/uL (ref 4.0–10.5)
nRBC: 0 % (ref 0.0–0.2)

## 2022-04-13 LAB — CMP (CANCER CENTER ONLY)
ALT: 16 U/L (ref 0–44)
AST: 13 U/L — ABNORMAL LOW (ref 15–41)
Albumin: 4.1 g/dL (ref 3.5–5.0)
Alkaline Phosphatase: 85 U/L (ref 38–126)
Anion gap: 6 (ref 5–15)
BUN: 15 mg/dL (ref 8–23)
CO2: 28 mmol/L (ref 22–32)
Calcium: 9.1 mg/dL (ref 8.9–10.3)
Chloride: 107 mmol/L (ref 98–111)
Creatinine: 0.9 mg/dL (ref 0.61–1.24)
GFR, Estimated: >60 mL/min (ref >60)
Glucose, Bld: 88 mg/dL (ref 70–99)
Potassium: 4.3 mmol/L (ref 3.5–5.1)
Sodium: 141 mmol/L (ref 135–145)
Total Bilirubin: 0.3 mg/dL (ref 0.3–1.2)
Total Protein: 5.5 g/dL — ABNORMAL LOW (ref 6.5–8.1)

## 2022-04-13 LAB — LACTATE DEHYDROGENASE: LDH: 155 U/L (ref 98–192)

## 2022-04-13 MED ORDER — DIPHENHYDRAMINE HCL 25 MG PO CAPS: 50.0000 mg | ORAL_CAPSULE | Freq: Once | ORAL | Status: DC

## 2022-04-13 MED ORDER — ACETAMINOPHEN 325 MG PO TABS: 650.0000 mg | ORAL_TABLET | Freq: Once | ORAL | Status: DC

## 2022-04-13 MED ORDER — DARATUMUMAB-HYALURONIDASE-FIHJ 1800-30000 MG-UT/15ML ~~LOC~~ SOLN
1800.0000 mg | Freq: Once | SUBCUTANEOUS | Status: AC
  Administered 2022-04-13: 1800 mg via SUBCUTANEOUS
  Filled 2022-04-13: qty 15

## 2022-04-13 MED ORDER — FAMOTIDINE 20 MG PO TABS: 40.0000 mg | ORAL_TABLET | Freq: Once | ORAL | Status: DC

## 2022-04-13 NOTE — Patient Instructions (Addendum)
[facility]  Discharge Instructions: Thank you for choosing [facility] to provide your oncology and hematology care.   If you have a lab appointment with the Cancer Center, please go directly to the Cancer Center and check in at the registration area.  Wear comfortable clothing and clothing appropriate for easy access to any Portacath or PICC line.   We strive to give you quality time with your provider. You may need to reschedule your appointment if you arrive late (15 or more minutes).  Arriving late affects you and other patients whose appointments are after yours.  Also, if you miss three or more appointments without notifying the office, you may be dismissed from the clinic at the provider's discretion.      For prescription refill requests, have your pharmacy contact our office and allow 72 hours for refills to be completed.    Today you received the following chemotherapy and/or immunotherapy agents Darzalex.      To help prevent nausea and vomiting after your treatment, we encourage you to take your nausea medication as directed.  BELOW ARE SYMPTOMS THAT SHOULD BE REPORTED IMMEDIATELY: *FEVER GREATER THAN 100.4 F (38 C) OR HIGHER *CHILLS OR SWEATING *NAUSEA AND VOMITING THAT IS NOT CONTROLLED WITH YOUR NAUSEA MEDICATION *UNUSUAL SHORTNESS OF BREATH *UNUSUAL BRUISING OR BLEEDING *URINARY PROBLEMS (pain or burning when urinating, or frequent urination) *BOWEL PROBLEMS (unusual diarrhea, constipation, pain near the anus) TENDERNESS IN MOUTH AND THROAT WITH OR WITHOUT PRESENCE OF ULCERS (sore throat, sores in mouth, or a toothache) UNUSUAL RASH, SWELLING OR PAIN  UNUSUAL VAGINAL DISCHARGE OR ITCHING   Items with * indicate a potential emergency and should be followed up as soon as possible or go to the Emergency Department if any problems should occur.  Please show the CHEMOTHERAPY ALERT CARD or IMMUNOTHERAPY ALERT CARD at check-in to the  Emergency Department and triage nurse. Should you have questions after your visit or need to cancel or reschedule your appointment, please contact [facility]  [phone] and follow the prompts.  Office hours are 8:00 a.m. to 4:30 p.m. Monday - Friday. Please note that voicemails left after 4:00 p.m. may not be returned until the following business day.  We are closed weekends and major holidays. You have access to a nurse at all times for urgent questions. Please call the main number to the clinic [phone] and follow the prompts.  For any non-urgent questions, you may also contact your provider using MyChart. We now offer e-Visits for anyone 18 and older to request care online for non-urgent symptoms. For details visit [email or web address].   Also download the MyChart app! Go to the app store, search "MyChart", open the app, select [facility], and log in with your MyChart username and password.  Masks are optional in the cancer centers. If you would like for your care team to wear a mask while they are taking care of you, please let them know. For doctor visits, patients may have with them one support person who is at least 71 years old. At this time, visitors are not allowed in the infusion area. 

## 2022-04-13 NOTE — Progress Notes (Signed)
Hematology and Oncology Follow Up Visit  Bruce Smith [id number] February 07, 1951 71 y.o. 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  He just had a birthday.  He had a wonderful birthday.  He and his wife were down in [place] for [place] Day.  They had a wonderful time down there.  He is doing much better.  His back is doing much better since he had surgery.  He and his wife will be going to [facility] over [place] Day weekend.  I am sure that they will have a wonderful time.  They went fishing.  They ate out quite a bit.  I am so happy that he had a good time.  He is feeling well.  His monoclonal studies are looking great.  There is no monoclonal spike in his blood.  His Kappa light chain is 0.7 mg/dL.  He is taking quite a bit of Tylenol.  We are trying to cut back on the Tylenol.  So far his liver has had no problem.  He has had no bleeding.  There is been no change in bowel or bladder habits.  He has had no cough or shortness of breath.  There has been no leg swelling.  He has had no rashes.  He has had no headache.  Overall, his performance status is ECOG 1.  Medications:  Allergies as of 04/13/2022   No Known Allergies      Medication List        Accurate as of April 13, 2022  9:04 AM. If you have any questions, ask your nurse or doctor.           STOP taking these medications    Florastor 250 MG capsule Generic drug: saccharomyces boulardii Stopped by: Volanda Napoleon, MD   MULTIVITAMIN MEN 50+ PO Stopped by: Volanda Napoleon, MD       TAKE these medications    acetaminophen 650 MG CR tablet Commonly known as: TYLENOL Take 1,300 mg by mouth in the morning, at noon, in the evening, and at bedtime. What changed: Another medication with the same name was removed. Continue taking this medication, and follow the directions you see here. Changed by: Volanda Napoleon, MD   acyclovir 400 MG tablet Commonly known as: ZOVIRAX Take 1 tablet (400 mg total) by mouth 2 (two) times daily.   aspirin EC 81 MG tablet Take by mouth.   benzonatate 200 MG capsule Commonly known as: TESSALON Take 200 mg by mouth 3 (three) times daily as needed.   calcium carbonate 1250 (500 Ca) MG tablet Commonly known as: OS-CAL - dosed in mg of elemental calcium Take 1 tablet (500 mg of elemental calcium total) by mouth 2 (two) times daily with a meal.   celecoxib 100 MG capsule Commonly known as:  CELEBREX TAKE 1 CAPSULE BY MOUTH 2 TIMES DAILY WITH FOOD AS NEEDED FOR PAIN & INFLAMMATION   dronabinol 5 MG capsule Commonly known as: MARINOL TAKE 1 CAPSULE BY MOUTH TWICE DAILY BEFORE A MEAL   levETIRAcetam 500 MG tablet Commonly known as: KEPPRA Take 1 tablet (500 mg total) by mouth 2 (two) times daily.   loperamide 2 MG capsule Commonly known as: IMODIUM Take 1 capsule (2 mg total) by mouth as needed for diarrhea or loose stools.   metoprolol succinate 25 MG 24 hr tablet Commonly known as: TOPROL-XL Take 25 mg by mouth daily.   mupirocin ointment 2 % Commonly known as: BACTROBAN Apply topically daily.   ondansetron 8 MG tablet Commonly known as: ZOFRAN TAKE 1 TABLET BY MOUTH EVERY 8 HOURS AS NEEDED NAUSEA AND VOMITING   pantoprazole 40 MG tablet Commonly known as: PROTONIX TAKE 1 TABLET BY MOUTH 2 TIMES A DAY BEFORE MEALS FOR ACID  REFLUX   polyethylene glycol powder 17 GM/SCOOP powder Commonly known as: GLYCOLAX/MIRALAX Take by mouth daily.   pregabalin 200 MG capsule Commonly known as: LYRICA Take 1 capsule (200 mg total) by mouth 2 (two) times daily. What changed: Another medication with the same name was removed. Continue taking this medication, and follow the directions you see here. Changed by: Volanda Napoleon, MD   tamsulosin 0.4 MG Caps capsule Commonly known as: FLOMAX TAKE ONE CAPSULE BY MOUTH AT BEDTIME   traMADol 50 MG tablet Commonly known as: ULTRAM TAKE TWO TABLETS BY MOUTH EVERY 6 HOURS AS NEEDED FOR PAIN        Allergies: No Known Allergies  Past Medical History, Surgical history, Social history, and Family History were reviewed and updated.  Review of Systems: Review of Systems  Constitutional: Negative.   HENT: Negative.    Eyes: Negative.   Respiratory: Negative.    Cardiovascular: Negative.   Gastrointestinal: Negative.   Genitourinary: Negative.   Musculoskeletal:  Positive for joint pain.  Skin: Negative.   Neurological: Negative.   Endo/Heme/Allergies: Negative.   Psychiatric/Behavioral: Negative.       Physical Exam:  weight is 191 lb 6.4 oz (86.8 kg). His oral temperature is 98.1 F (36.7 C). His blood pressure is 141/60 (abnormal) and his pulse is 58 (abnormal). His respiration is 20.   Wt Readings from Last 3 Encounters:  04/13/22 191 lb 6.4 oz (86.8 kg)  03/16/22 189 lb (85.7 kg)  02/16/22 196 lb 1.9 oz (89 kg)    Physical Exam Vitals reviewed.  HENT:     Head: Normocephalic and atraumatic.  Eyes:     Pupils: Pupils are equal, round, and reactive to light.  Cardiovascular:     Rate and Rhythm: Normal rate and regular rhythm.     Heart sounds: Normal heart sounds.  Pulmonary:     Effort: Pulmonary effort is normal.     Breath sounds: Normal breath sounds.  Abdominal:     General: Bowel sounds are normal.     Palpations: Abdomen is soft.   Musculoskeletal:        General: No tenderness or deformity. Normal range of motion.     Cervical back: Normal range of motion.  Lymphadenopathy:     Cervical: No cervical adenopathy.  Skin:    General: Skin is warm and dry.     Findings: No erythema or rash.  Neurological:     Mental Status: He is alert and oriented to person, place, and time.  Psychiatric:  Behavior: Behavior normal.        Thought Content: Thought content normal.        Judgment: Judgment normal.      Lab Results  Component Value Date   WBC 6.2 04/13/2022   HGB 13.3 04/13/2022   HCT 40.5 04/13/2022   MCV 96.7 04/13/2022   PLT 171 04/13/2022   Lab Results  Component Value Date   FERRITIN 938 (H) 02/23/2021   IRON 67 02/23/2021   TIBC 156 (L) 02/23/2021   UIBC 90 (L) 02/23/2021   IRONPCTSAT 43 02/23/2021   Lab Results  Component Value Date   RBC 4.19 (L) 04/13/2022   Lab Results  Component Value Date   KPAFRELGTCHN 6.6 03/16/2022   LAMBDASER 6.6 03/16/2022   KAPLAMBRATIO 1.00 03/16/2022   Lab Results  Component Value Date   IGGSERUM 270 (L) 03/16/2022   IGA 18 (L) 03/16/2022   IGMSERUM 13 (L) 03/16/2022   Lab Results  Component Value Date   TOTALPROTELP 5.0 (L) 03/16/2022   ALBUMINELP 3.3 03/16/2022   A1GS 0.2 03/16/2022   A2GS 0.8 03/16/2022   BETS 0.6 (L) 03/16/2022   GAMS 0.2 (L) 03/16/2022   MSPIKE Not Observed 03/16/2022     Chemistry      Component Value Date/Time   NA 139 03/16/2022 0842   K 4.6 03/16/2022 0842   CL 106 03/16/2022 0842   CO2 28 03/16/2022 0842   BUN 21 03/16/2022 0842   CREATININE 0.90 03/16/2022 0842      Component Value Date/Time   CALCIUM 9.0 03/16/2022 0842   ALKPHOS 88 03/16/2022 0842   AST 12 (L) 03/16/2022 0842   ALT 31 03/16/2022 0842   BILITOT 0.4 03/16/2022 0842       Impression and Plan: Bruce Smith is a very pleasant 71 yo gentleman with 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    I am happy that he is doing so well.  He will get his Faspro today.  He gets his [facility] See in July.  Again, I am just happy that his pain is doing somewhat better now.  Is much more active.  His quality life is improved.  We will plan to have him come back to see [place] in another month.   Volanda Napoleon, MD 6/14/20239:04 AM

## 2022-04-14 LAB — PROTEIN ELECTROPHORESIS, SERUM, WITH REFLEX
A/G Ratio: 1.6 (ref 0.7–1.7)
Albumin ELP: 3.3 g/dL (ref 2.9–4.4)
Alpha-1-Globulin: 0.2 g/dL (ref 0.0–0.4)
Alpha-2-Globulin: 0.8 g/dL (ref 0.4–1.0)
Beta Globulin: 0.8 g/dL (ref 0.7–1.3)
Gamma Globulin: 0.3 g/dL — ABNORMAL LOW (ref 0.4–1.8)
Globulin, Total: 2.1 g/dL — ABNORMAL LOW (ref 2.2–3.9)
Total Protein ELP: 5.4 g/dL — ABNORMAL LOW (ref 6.0–8.5)

## 2022-04-14 LAB — KAPPA/LAMBDA LIGHT CHAINS
Kappa free light chain: 6.3 mg/L (ref 3.3–19.4)
Kappa, lambda light chain ratio: 1.7 — ABNORMAL HIGH (ref 0.26–1.65)
Lambda free light chains: 3.7 mg/L — ABNORMAL LOW (ref 5.7–26.3)

## 2022-04-14 LAB — IGG, IGA, IGM
IgA: 19 mg/dL — ABNORMAL LOW (ref 61–437)
IgG (Immunoglobin G), Serum: 274 mg/dL — ABNORMAL LOW (ref 603–1613)
IgM (Immunoglobulin M), Srm: 11 mg/dL — ABNORMAL LOW (ref 15–143)

## 2022-05-10 ENCOUNTER — Inpatient Hospital Stay (HOSPITAL_BASED_OUTPATIENT_CLINIC_OR_DEPARTMENT_OTHER): Payer: Medicare Other | Admitting: Hematology & Oncology

## 2022-05-10 ENCOUNTER — Inpatient Hospital Stay: Payer: Medicare Other

## 2022-05-10 ENCOUNTER — Inpatient Hospital Stay: Payer: Medicare Other | Attending: Hematology & Oncology

## 2022-05-10 ENCOUNTER — Encounter: Payer: Self-pay | Admitting: Hematology & Oncology

## 2022-05-10 VITALS — BP 123/65 | HR 54 | Temp 98.3°F | Resp 18 | Wt 193.2 lb

## 2022-05-10 DIAGNOSIS — C9000 Multiple myeloma not having achieved remission: Secondary | ICD-10-CM | POA: Diagnosis present

## 2022-05-10 DIAGNOSIS — C419 Malignant neoplasm of bone and articular cartilage, unspecified: Secondary | ICD-10-CM | POA: Diagnosis not present

## 2022-05-10 DIAGNOSIS — Z5112 Encounter for antineoplastic immunotherapy: Secondary | ICD-10-CM | POA: Diagnosis present

## 2022-05-10 LAB — CBC WITH DIFFERENTIAL (CANCER CENTER ONLY)
Abs Immature Granulocytes: 0.05 10*3/uL (ref 0.00–0.07)
Basophils Absolute: 0 10*3/uL (ref 0.0–0.1)
Basophils Relative: 0 %
Eosinophils Absolute: 0.1 10*3/uL (ref 0.0–0.5)
Eosinophils Relative: 2 %
HCT: 40.2 % (ref 39.0–52.0)
Hemoglobin: 13.4 g/dL (ref 13.0–17.0)
Immature Granulocytes: 1 %
Lymphocytes Relative: 21 %
Lymphs Abs: 1.4 10*3/uL (ref 0.7–4.0)
MCH: 32.5 pg (ref 26.0–34.0)
MCHC: 33.3 g/dL (ref 30.0–36.0)
MCV: 97.6 fL (ref 80.0–100.0)
Monocytes Absolute: 0.9 10*3/uL (ref 0.1–1.0)
Monocytes Relative: 14 %
Neutro Abs: 4.2 10*3/uL (ref 1.7–7.7)
Neutrophils Relative %: 62 %
Platelet Count: 169 10*3/uL (ref 150–400)
RBC: 4.12 MIL/uL — ABNORMAL LOW (ref 4.22–5.81)
RDW: 14.2 % (ref 11.5–15.5)
WBC Count: 6.7 10*3/uL (ref 4.0–10.5)
nRBC: 0 % (ref 0.0–0.2)

## 2022-05-10 LAB — CMP (CANCER CENTER ONLY)
ALT: 18 U/L (ref 0–44)
AST: 12 U/L — ABNORMAL LOW (ref 15–41)
Albumin: 4.4 g/dL (ref 3.5–5.0)
Alkaline Phosphatase: 78 U/L (ref 38–126)
Anion gap: 7 (ref 5–15)
BUN: 24 mg/dL — ABNORMAL HIGH (ref 8–23)
CO2: 28 mmol/L (ref 22–32)
Calcium: 9.7 mg/dL (ref 8.9–10.3)
Chloride: 104 mmol/L (ref 98–111)
Creatinine: 1.09 mg/dL (ref 0.61–1.24)
GFR, Estimated: >60 mL/min (ref >60)
Glucose, Bld: 83 mg/dL (ref 70–99)
Potassium: 4.6 mmol/L (ref 3.5–5.1)
Sodium: 139 mmol/L (ref 135–145)
Total Bilirubin: 0.3 mg/dL (ref 0.3–1.2)
Total Protein: 5.7 g/dL — ABNORMAL LOW (ref 6.5–8.1)

## 2022-05-10 LAB — LACTATE DEHYDROGENASE: LDH: 140 U/L (ref 98–192)

## 2022-05-10 MED ORDER — DENOSUMAB 120 MG/1.7ML ~~LOC~~ SOLN
120.0000 mg | Freq: Once | SUBCUTANEOUS | Status: AC
  Administered 2022-05-10: 120 mg via SUBCUTANEOUS
  Filled 2022-05-10: qty 1.7

## 2022-05-10 MED ORDER — ACETAMINOPHEN 325 MG PO TABS: 650.0000 mg | ORAL_TABLET | Freq: Once | ORAL | Status: DC

## 2022-05-10 MED ORDER — DARATUMUMAB-HYALURONIDASE-FIHJ 1800-30000 MG-UT/15ML ~~LOC~~ SOLN
1800.0000 mg | Freq: Once | SUBCUTANEOUS | Status: AC
  Administered 2022-05-10: 1800 mg via SUBCUTANEOUS
  Filled 2022-05-10: qty 15

## 2022-05-10 MED ORDER — DIPHENHYDRAMINE HCL 25 MG PO CAPS: 50.0000 mg | ORAL_CAPSULE | Freq: Once | ORAL | Status: DC

## 2022-05-10 MED ORDER — FAMOTIDINE 20 MG PO TABS: 40.0000 mg | ORAL_TABLET | Freq: Once | ORAL | Status: DC

## 2022-05-10 NOTE — Progress Notes (Signed)
Hematology and Oncology Follow Up Visit  Baird Polinski [id number] 05-Feb-1951 71 y.o. 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 Lange is here today with his wife for follow-up.  He continues to look fantastic.  I am just so happy that he is done so well.  His back seems to be doing much better since he had surgery.  He and his wife will be going out to [place] for a nephew's birthday.  It sounds like they will have a wonderful time.  He is getting around fairly well.  He has had no problems with cough.  He has had no nausea or vomiting.  He has had no fever, sweats or chills.  His last Kappa light chain level was holding steady at 0.6 mg/dL.  He has had no problems with bowels or bladder.  His appetite has been quite good.  He has had no nausea or vomiting.  He has had no leg swelling.  There is been no rashes.  He has had no bleeding.  Overall, I would say his performance status is probably ECOG 1.    Medications:  Allergies as of 05/10/2022   No Known Allergies      Medication List        Accurate as of May 10, 2022  9:04 AM. If you have any questions, ask your nurse or doctor.          acetaminophen 650 MG CR tablet Commonly known as: TYLENOL Take 1,300 mg by mouth in the morning, at noon, in the evening, and at bedtime.   acyclovir 400 MG tablet Commonly known as: ZOVIRAX Take 1 tablet  (400 mg total) by mouth 2 (two) times daily.   aspirin EC 81 MG tablet Take by mouth.   benzonatate 200 MG capsule Commonly known as: TESSALON Take 200 mg by mouth 3 (three) times daily as needed.   calcium carbonate 1250 (500 Ca) MG tablet Commonly known as: OS-CAL - dosed in mg of elemental calcium Take 1 tablet (500 mg of elemental calcium total) by mouth 2 (two) times daily with a meal.   celecoxib 100 MG capsule Commonly known as: CELEBREX TAKE 1 CAPSULE BY MOUTH 2 TIMES DAILY WITH FOOD AS NEEDED FOR PAIN & INFLAMMATION   dronabinol 5 MG capsule Commonly known as: MARINOL TAKE 1 CAPSULE BY MOUTH TWICE DAILY BEFORE A MEAL   levETIRAcetam 500 MG tablet Commonly known as: KEPPRA Take 1 tablet (500 mg total) by mouth 2 (two) times daily.   loperamide 2 MG capsule Commonly known as: IMODIUM Take 1 capsule (2 mg total) by mouth as needed for diarrhea or loose stools.   metoprolol succinate 25 MG 24 hr tablet Commonly known as: TOPROL-XL Take 25 mg by mouth daily.   mupirocin ointment 2 % Commonly known as: BACTROBAN Apply topically daily.   ondansetron  8 MG tablet Commonly known as: ZOFRAN TAKE 1 TABLET BY MOUTH EVERY 8 HOURS AS NEEDED NAUSEA AND VOMITING   pantoprazole 40 MG tablet Commonly known as: PROTONIX TAKE 1 TABLET BY MOUTH 2 TIMES A DAY BEFORE MEALS FOR ACID REFLUX   polyethylene glycol powder 17 GM/SCOOP powder Commonly known as: GLYCOLAX/MIRALAX Take by mouth daily.   pregabalin 200 MG capsule Commonly known as: LYRICA Take 1 capsule (200 mg total) by mouth 2 (two) times daily.   tamsulosin 0.4 MG Caps capsule Commonly known as: FLOMAX TAKE ONE CAPSULE BY MOUTH AT BEDTIME   traMADol 50 MG tablet Commonly known as: ULTRAM TAKE TWO TABLETS BY MOUTH EVERY 6 HOURS AS NEEDED FOR PAIN        Allergies: No Known Allergies  Past Medical History, Surgical history, Social history, and Family History were reviewed and updated.  Review of  Systems: Review of Systems  Constitutional: Negative.   HENT: Negative.    Eyes: Negative.   Respiratory: Negative.    Cardiovascular: Negative.   Gastrointestinal: Negative.   Genitourinary: Negative.   Musculoskeletal:  Positive for joint pain.  Skin: Negative.   Neurological: Negative.   Endo/Heme/Allergies: Negative.   Psychiatric/Behavioral: Negative.       Physical Exam:  weight is 193 lb 4 oz (87.7 kg). His oral temperature is 98.3 F (36.8 C). His blood pressure is 123/65 and his pulse is 54 (abnormal). His respiration is 18 and oxygen saturation is 97%.   Wt Readings from Last 3 Encounters:  05/10/22 193 lb 4 oz (87.7 kg)  04/13/22 191 lb 6.4 oz (86.8 kg)  03/16/22 189 lb (85.7 kg)    Physical Exam Vitals reviewed.  HENT:     Head: Normocephalic and atraumatic.  Eyes:     Pupils: Pupils are equal, round, and reactive to light.  Cardiovascular:     Rate and Rhythm: Normal rate and regular rhythm.     Heart sounds: Normal heart sounds.  Pulmonary:     Effort: Pulmonary effort is normal.     Breath sounds: Normal breath sounds.  Abdominal:     General: Bowel sounds are normal.     Palpations: Abdomen is soft.  Musculoskeletal:        General: No tenderness or deformity. Normal range of motion.     Cervical back: Normal range of motion.  Lymphadenopathy:     Cervical: No cervical adenopathy.  Skin:    General: Skin is warm and dry.     Findings: No erythema or rash.  Neurological:     Mental Status: He is alert and oriented to person, place, and time.  Psychiatric:        Behavior: Behavior normal.        Thought Content: Thought content normal.        Judgment: Judgment normal.     Lab Results  Component Value Date   WBC 6.7 05/10/2022   HGB 13.4 05/10/2022   HCT 40.2 05/10/2022   MCV 97.6 05/10/2022   PLT 169 05/10/2022   Lab Results  Component Value Date   FERRITIN 938 (H) 02/23/2021   IRON 67 02/23/2021   TIBC 156 (L) 02/23/2021   UIBC  90 (L) 02/23/2021   IRONPCTSAT 43 02/23/2021   Lab Results  Component Value Date   RBC 4.12 (L) 05/10/2022   Lab Results  Component Value Date   KPAFRELGTCHN 6.3 04/13/2022   LAMBDASER 3.7 (L) 04/13/2022   KAPLAMBRATIO 1.70 (H) 04/13/2022  Lab Results  Component Value Date   IGGSERUM 274 (L) 04/13/2022   IGA 19 (L) 04/13/2022   IGMSERUM 11 (L) 04/13/2022   Lab Results  Component Value Date   TOTALPROTELP 5.4 (L) 04/13/2022   ALBUMINELP 3.3 04/13/2022   A1GS 0.2 04/13/2022   A2GS 0.8 04/13/2022   BETS 0.8 04/13/2022   GAMS 0.3 (L) 04/13/2022   MSPIKE Not Observed 04/13/2022     Chemistry      Component Value Date/Time   NA 139 05/10/2022 0819   K 4.6 05/10/2022 0819   CL 104 05/10/2022 0819   CO2 28 05/10/2022 0819   BUN 24 (H) 05/10/2022 0819   CREATININE 1.09 05/10/2022 0819      Component Value Date/Time   CALCIUM 9.7 05/10/2022 0819   ALKPHOS 78 05/10/2022 0819   AST 12 (L) 05/10/2022 0819   ALT 18 05/10/2022 0819   BILITOT 0.3 05/10/2022 0819       Impression and Plan: Mr. Kulpa is a very pleasant 71 yo gentleman with 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   I am happy that he is doing so well.  He will get his Faspro today.  He will also get his Xgeva today.  Is apparent that his quality life is doing so well.  I am happy about this.  The [name] is definitely helping.  I think what helped mostly is the fact that he had surgery for his back.  We will still plan for follow-up monthly.     Volanda Napoleon, MD 7/11/20239:04 AM

## 2022-05-11 LAB — IGG, IGA, IGM
IgA: 18 mg/dL — ABNORMAL LOW (ref 61–437)
IgG (Immunoglobin G), Serum: 312 mg/dL — ABNORMAL LOW (ref 603–1613)
IgM (Immunoglobulin M), Srm: 9 mg/dL — ABNORMAL LOW (ref 15–143)

## 2022-05-11 LAB — KAPPA/LAMBDA LIGHT CHAINS
Kappa free light chain: 7 mg/L (ref 3.3–19.4)
Kappa, lambda light chain ratio: 2 — ABNORMAL HIGH (ref 0.26–1.65)
Lambda free light chains: 3.5 mg/L — ABNORMAL LOW (ref 5.7–26.3)

## 2022-05-14 LAB — PROTEIN ELECTROPHORESIS, SERUM, WITH REFLEX
A/G Ratio: 1.9 — ABNORMAL HIGH (ref 0.7–1.7)
Albumin ELP: 3.7 g/dL (ref 2.9–4.4)
Alpha-1-Globulin: 0.2 g/dL (ref 0.0–0.4)
Alpha-2-Globulin: 0.8 g/dL (ref 0.4–1.0)
Beta Globulin: 0.7 g/dL (ref 0.7–1.3)
Gamma Globulin: 0.3 g/dL — ABNORMAL LOW (ref 0.4–1.8)
Globulin, Total: 2 g/dL — ABNORMAL LOW (ref 2.2–3.9)
M-Spike, %: 0.1 g/dL — ABNORMAL HIGH
SPEP Interpretation: 0
Total Protein ELP: 5.7 g/dL — ABNORMAL LOW (ref 6.0–8.5)

## 2022-05-14 LAB — IMMUNOFIXATION REFLEX, SERUM
IgA: 17 mg/dL — ABNORMAL LOW (ref 61–437)
IgG (Immunoglobin G), Serum: 283 mg/dL — ABNORMAL LOW (ref 603–1613)
IgM (Immunoglobulin M), Srm: 8 mg/dL — ABNORMAL LOW (ref 15–143)

## 2022-05-23 ENCOUNTER — Other Ambulatory Visit: Payer: Self-pay

## 2022-05-25 ENCOUNTER — Other Ambulatory Visit: Payer: Self-pay

## 2022-05-30 ENCOUNTER — Other Ambulatory Visit: Payer: Self-pay | Admitting: Hematology & Oncology

## 2022-05-31 ENCOUNTER — Other Ambulatory Visit: Payer: Self-pay

## 2022-06-08 ENCOUNTER — Inpatient Hospital Stay: Payer: Medicare Other | Attending: Hematology & Oncology

## 2022-06-08 ENCOUNTER — Encounter: Payer: Self-pay | Admitting: Hematology & Oncology

## 2022-06-08 ENCOUNTER — Inpatient Hospital Stay: Payer: Medicare Other | Admitting: Hematology & Oncology

## 2022-06-08 ENCOUNTER — Other Ambulatory Visit: Payer: Self-pay

## 2022-06-08 ENCOUNTER — Inpatient Hospital Stay: Payer: Medicare Other

## 2022-06-08 VITALS — BP 130/57 | HR 53 | Temp 98.1°F | Resp 17 | Wt 197.0 lb

## 2022-06-08 DIAGNOSIS — C9000 Multiple myeloma not having achieved remission: Secondary | ICD-10-CM | POA: Diagnosis present

## 2022-06-08 DIAGNOSIS — Z5112 Encounter for antineoplastic immunotherapy: Secondary | ICD-10-CM | POA: Diagnosis present

## 2022-06-08 DIAGNOSIS — C419 Malignant neoplasm of bone and articular cartilage, unspecified: Secondary | ICD-10-CM

## 2022-06-08 LAB — CBC WITH DIFFERENTIAL (CANCER CENTER ONLY)
Abs Immature Granulocytes: 0.05 10*3/uL (ref 0.00–0.07)
Basophils Absolute: 0 10*3/uL (ref 0.0–0.1)
Basophils Relative: 0 %
Eosinophils Absolute: 0.2 10*3/uL (ref 0.0–0.5)
Eosinophils Relative: 3 %
HCT: 41.9 % (ref 39.0–52.0)
Hemoglobin: 13.9 g/dL (ref 13.0–17.0)
Immature Granulocytes: 1 %
Lymphocytes Relative: 18 %
Lymphs Abs: 1.3 10*3/uL (ref 0.7–4.0)
MCH: 32.7 pg (ref 26.0–34.0)
MCHC: 33.2 g/dL (ref 30.0–36.0)
MCV: 98.6 fL (ref 80.0–100.0)
Monocytes Absolute: 0.9 10*3/uL (ref 0.1–1.0)
Monocytes Relative: 12 %
Neutro Abs: 4.9 10*3/uL (ref 1.7–7.7)
Neutrophils Relative %: 66 %
Platelet Count: 182 10*3/uL (ref 150–400)
RBC: 4.25 MIL/uL (ref 4.22–5.81)
RDW: 13.2 % (ref 11.5–15.5)
WBC Count: 7.4 10*3/uL (ref 4.0–10.5)
nRBC: 0 % (ref 0.0–0.2)

## 2022-06-08 LAB — LACTATE DEHYDROGENASE: LDH: 132 U/L (ref 98–192)

## 2022-06-08 LAB — CMP (CANCER CENTER ONLY)
ALT: 19 U/L (ref 0–44)
AST: 13 U/L — ABNORMAL LOW (ref 15–41)
Albumin: 4.4 g/dL (ref 3.5–5.0)
Alkaline Phosphatase: 64 U/L (ref 38–126)
Anion gap: 5 (ref 5–15)
BUN: 21 mg/dL (ref 8–23)
CO2: 30 mmol/L (ref 22–32)
Calcium: 9.7 mg/dL (ref 8.9–10.3)
Chloride: 104 mmol/L (ref 98–111)
Creatinine: 1.12 mg/dL (ref 0.61–1.24)
GFR, Estimated: >60 mL/min (ref >60)
Glucose, Bld: 90 mg/dL (ref 70–99)
Potassium: 4.5 mmol/L (ref 3.5–5.1)
Sodium: 139 mmol/L (ref 135–145)
Total Bilirubin: 0.3 mg/dL (ref 0.3–1.2)
Total Protein: 5.7 g/dL — ABNORMAL LOW (ref 6.5–8.1)

## 2022-06-08 MED ORDER — ACETAMINOPHEN 325 MG PO TABS: 650.0000 mg | ORAL_TABLET | Freq: Once | ORAL | Status: DC

## 2022-06-08 MED ORDER — DIPHENHYDRAMINE HCL 25 MG PO CAPS: 50.0000 mg | ORAL_CAPSULE | Freq: Once | ORAL | Status: DC

## 2022-06-08 MED ORDER — DARATUMUMAB-HYALURONIDASE-FIHJ 1800-30000 MG-UT/15ML ~~LOC~~ SOLN
1800.0000 mg | Freq: Once | SUBCUTANEOUS | Status: AC
  Administered 2022-06-08: 1800 mg via SUBCUTANEOUS
  Filled 2022-06-08: qty 15

## 2022-06-08 MED ORDER — FAMOTIDINE 20 MG PO TABS: 40.0000 mg | ORAL_TABLET | Freq: Once | ORAL | Status: DC

## 2022-06-08 NOTE — Progress Notes (Signed)
Hematology and Oncology Follow Up Visit  Bruce Smith [id number] November 04, 1950 71 y.o. 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  They had a wonderful time out in [place].  There is therapy for a nephew's birthday.  They really enjoyed themselves.  They were in [place].  He has done well.  He has little bit of pain in the left hip.  He had hip surgery because of a pathologic fracture.  He has had no problems with cough or shortness of breath.  He has had no nausea or vomiting.  He has had no rashes.  Is been no leg swelling.  He had no fever.  He has had no bleeding.  So far, there is no evidence of recurrent myeloma.  His last Kappa light chain was 0.7 mg/dL.  Overall, I would say his performance status is ECOG 1.     Medications:  Allergies as of 06/08/2022   No Known Allergies      Medication List        Accurate as of June 08, 2022  9:17 AM. If you have any questions, ask your nurse or doctor.          STOP taking these medications    benzonatate 200 MG capsule Commonly known as: TESSALON Stopped by: Volanda Napoleon, MD   mupirocin ointment 2 % Commonly known as: BACTROBAN Stopped by: Volanda Napoleon, MD       TAKE these medications    acetaminophen 650 MG CR tablet Commonly known as: TYLENOL Take 1,300 mg by mouth in  the morning, at noon, in the evening, and at bedtime.   acyclovir 400 MG tablet Commonly known as: ZOVIRAX Take 1 tablet (400 mg total) by mouth 2 (two) times daily.   aspirin EC 81 MG tablet Take by mouth.   calcium carbonate 1250 (500 Ca) MG tablet Commonly known as: OS-CAL - dosed in mg of elemental calcium Take 1 tablet (500 mg of elemental calcium total) by mouth 2 (two) times daily with a meal.   celecoxib 100 MG capsule Commonly known as: CELEBREX TAKE 1 CAPSULE BY MOUTH 2 TIMES DAILY WITH FOOD AS NEEDED FOR PAIN & INFLAMMATION   dronabinol 5 MG capsule Commonly known as: MARINOL TAKE 1 CAPSULE BY MOUTH TWICE DAILY BEFORE A MEAL   levETIRAcetam 500 MG tablet Commonly known as: KEPPRA Take 1 tablet (500 mg total) by mouth 2 (two) times daily.   loperamide 2 MG capsule Commonly known as: IMODIUM Take 1 capsule (2 mg total) by mouth as needed for diarrhea or loose stools.   metoprolol succinate 25 MG 24 hr tablet Commonly known as: TOPROL-XL Take 25 mg by mouth daily.   ondansetron 8 MG tablet Commonly known as: ZOFRAN TAKE 1 TABLET BY MOUTH EVERY 8  HOURS AS NEEDED NAUSEA AND VOMITING   pantoprazole 40 MG tablet Commonly known as: PROTONIX TAKE 1 TABLET BY MOUTH 2 TIMES A DAY BEFORE MEALS FOR ACID REFLUX   polyethylene glycol powder 17 GM/SCOOP powder Commonly known as: GLYCOLAX/MIRALAX Take by mouth daily.   pregabalin 200 MG capsule Commonly known as: LYRICA Take 1 capsule (200 mg total) by mouth 2 (two) times daily.   tamsulosin 0.4 MG Caps capsule Commonly known as: FLOMAX TAKE ONE CAPSULE BY MOUTH AT BEDTIME   traMADol 50 MG tablet Commonly known as: ULTRAM TAKE TWO TABLETS BY MOUTH EVERY 6 HOURS AS NEEDED FOR PAIN        Allergies: No Known Allergies  Past Medical History, Surgical history, Social history, and Family History were reviewed and updated.  Review of Systems: Review of Systems  Constitutional: Negative.   HENT: Negative.     Eyes: Negative.   Respiratory: Negative.    Cardiovascular: Negative.   Gastrointestinal: Negative.   Genitourinary: Negative.   Musculoskeletal:  Positive for joint pain.  Skin: Negative.   Neurological: Negative.   Endo/Heme/Allergies: Negative.   Psychiatric/Behavioral: Negative.       Physical Exam:  weight is 197 lb (89.4 kg). His oral temperature is 98.1 F (36.7 C). His blood pressure is 130/57 (abnormal) and his pulse is 53 (abnormal). His respiration is 17 and oxygen saturation is 97%.   Wt Readings from Last 3 Encounters:  06/08/22 197 lb (89.4 kg)  05/10/22 193 lb 4 oz (87.7 kg)  04/13/22 191 lb 6.4 oz (86.8 kg)    Physical Exam Vitals reviewed.  HENT:     Head: Normocephalic and atraumatic.  Eyes:     Pupils: Pupils are equal, round, and reactive to light.  Cardiovascular:     Rate and Rhythm: Normal rate and regular rhythm.     Heart sounds: Normal heart sounds.  Pulmonary:     Effort: Pulmonary effort is normal.     Breath sounds: Normal breath sounds.  Abdominal:     General: Bowel sounds are normal.     Palpations: Abdomen is soft.  Musculoskeletal:        General: No tenderness or deformity. Normal range of motion.     Cervical back: Normal range of motion.  Lymphadenopathy:     Cervical: No cervical adenopathy.  Skin:    General: Skin is warm and dry.     Findings: No erythema or rash.  Neurological:     Mental Status: He is alert and oriented to person, place, and time.  Psychiatric:        Behavior: Behavior normal.        Thought Content: Thought content normal.        Judgment: Judgment normal.      Lab Results  Component Value Date   WBC 7.4 06/08/2022   HGB 13.9 06/08/2022   HCT 41.9 06/08/2022   MCV 98.6 06/08/2022   PLT 182 06/08/2022   Lab Results  Component Value Date   FERRITIN 938 (H) 02/23/2021   IRON 67 02/23/2021   TIBC 156 (L) 02/23/2021   UIBC 90 (L) 02/23/2021   IRONPCTSAT 43 02/23/2021   Lab Results   Component Value Date   RBC 4.25 06/08/2022   Lab Results  Component Value Date   KPAFRELGTCHN 7.0 05/10/2022   LAMBDASER 3.5 (L) 05/10/2022   KAPLAMBRATIO 2.00 (H) 05/10/2022   Lab Results  Component Value Date   IGGSERUM 283 (L) 05/10/2022   IGA  17 (L) 05/10/2022   IGMSERUM 8 (L) 05/10/2022   Lab Results  Component Value Date   TOTALPROTELP 5.7 (L) 05/10/2022   ALBUMINELP 3.7 05/10/2022   A1GS 0.2 05/10/2022   A2GS 0.8 05/10/2022   BETS 0.7 05/10/2022   GAMS 0.3 (L) 05/10/2022   MSPIKE 0.1 (H) 05/10/2022     Chemistry      Component Value Date/Time   NA 139 06/08/2022 0832   K 4.5 06/08/2022 0832   CL 104 06/08/2022 0832   CO2 30 06/08/2022 0832   BUN 21 06/08/2022 0832   CREATININE 1.12 06/08/2022 0832      Component Value Date/Time   CALCIUM 9.7 06/08/2022 0832   ALKPHOS 64 06/08/2022 0832   AST 13 (L) 06/08/2022 0832   ALT 19 06/08/2022 0832   BILITOT 0.3 06/08/2022 0832       Impression and Plan: Mr. Barbary is a very pleasant 71 yo gentleman with 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   Everything seems to be going quite well for him today.  I am happy that he had a wonderful time out.  [place].  He and his wife really had a good time.  Her next trip will be to [place] in October.  We will go ahead with his Faspro today.  We will plan to get him back in a month or as next cycle.   Volanda Napoleon, MD 8/9/20239:17 AM

## 2022-06-08 NOTE — Patient Instructions (Signed)
[facility]  Discharge Instructions: Thank you for choosing [facility] to provide your oncology and hematology care.   If you have a lab appointment with the Cancer Center, please go directly to the Cancer Center and check in at the registration area.  Wear comfortable clothing and clothing appropriate for easy access to any Portacath or PICC line.   We strive to give you quality time with your provider. You may need to reschedule your appointment if you arrive late (15 or more minutes).  Arriving late affects you and other patients whose appointments are after yours.  Also, if you miss three or more appointments without notifying the office, you may be dismissed from the clinic at the provider's discretion.      For prescription refill requests, have your pharmacy contact our office and allow 72 hours for refills to be completed.    Today you received the following chemotherapy and/or immunotherapy agents:  Faspro      To help prevent nausea and vomiting after your treatment, we encourage you to take your nausea medication as directed.  BELOW ARE SYMPTOMS THAT SHOULD BE REPORTED IMMEDIATELY: *FEVER GREATER THAN 100.4 F (38 C) OR HIGHER *CHILLS OR SWEATING *NAUSEA AND VOMITING THAT IS NOT CONTROLLED WITH YOUR NAUSEA MEDICATION *UNUSUAL SHORTNESS OF BREATH *UNUSUAL BRUISING OR BLEEDING *URINARY PROBLEMS (pain or burning when urinating, or frequent urination) *BOWEL PROBLEMS (unusual diarrhea, constipation, pain near the anus) TENDERNESS IN MOUTH AND THROAT WITH OR WITHOUT PRESENCE OF ULCERS (sore throat, sores in mouth, or a toothache) UNUSUAL RASH, SWELLING OR PAIN  UNUSUAL VAGINAL DISCHARGE OR ITCHING   Items with * indicate a potential emergency and should be followed up as soon as possible or go to the Emergency Department if any problems should occur.  Please show the CHEMOTHERAPY ALERT CARD or IMMUNOTHERAPY ALERT CARD at check-in to the  Emergency Department and triage nurse. Should you have questions after your visit or need to cancel or reschedule your appointment, please contact [facility]  [phone] and follow the prompts.  Office hours are 8:00 a.m. to 4:30 p.m. Monday - Friday. Please note that voicemails left after 4:00 p.m. may not be returned until the following business day.  We are closed weekends and major holidays. You have access to a nurse at all times for urgent questions. Please call the main number to the clinic [phone] and follow the prompts.  For any non-urgent questions, you may also contact your provider using MyChart. We now offer e-Visits for anyone 18 and older to request care online for non-urgent symptoms. For details visit [email or web address].   Also download the MyChart app! Go to the app store, search "MyChart", open the app, select [facility], and log in with your MyChart username and password.  Masks are optional in the cancer centers. If you would like for your care team to wear a mask while they are taking care of you, please let them know. You may have one support person who is at least 71 years old accompany you for your appointments. 

## 2022-06-08 NOTE — Addendum Note (Signed)
Addended by: Burney Gauze R on: 06/08/2022 01:03 PM   Modules accepted: Orders

## 2022-06-09 LAB — IGG, IGA, IGM
IgA: 17 mg/dL — ABNORMAL LOW (ref 61–437)
IgG (Immunoglobin G), Serum: 314 mg/dL — ABNORMAL LOW (ref 603–1613)
IgM (Immunoglobulin M), Srm: 9 mg/dL — ABNORMAL LOW (ref 15–143)

## 2022-06-09 LAB — KAPPA/LAMBDA LIGHT CHAINS
Kappa free light chain: 5.3 mg/L (ref 3.3–19.4)
Kappa, lambda light chain ratio: 1.02 (ref 0.26–1.65)
Lambda free light chains: 5.2 mg/L — ABNORMAL LOW (ref 5.7–26.3)

## 2022-06-13 LAB — PROTEIN ELECTROPHORESIS, SERUM, WITH REFLEX
A/G Ratio: 1.9 — ABNORMAL HIGH (ref 0.7–1.7)
Albumin ELP: 3.7 g/dL (ref 2.9–4.4)
Alpha-1-Globulin: 0.2 g/dL (ref 0.0–0.4)
Alpha-2-Globulin: 0.8 g/dL (ref 0.4–1.0)
Beta Globulin: 0.7 g/dL (ref 0.7–1.3)
Gamma Globulin: 0.2 g/dL — ABNORMAL LOW (ref 0.4–1.8)
Globulin, Total: 2 g/dL — ABNORMAL LOW (ref 2.2–3.9)
Total Protein ELP: 5.7 g/dL — ABNORMAL LOW (ref 6.0–8.5)

## 2022-06-21 NOTE — Progress Notes (Signed)
Patient's payor prefers [employer] to [employer]. Orders changed per Dr. Antonieta Pert instructions.

## 2022-06-23 ENCOUNTER — Other Ambulatory Visit: Payer: Self-pay

## 2022-06-27 ENCOUNTER — Other Ambulatory Visit: Payer: Self-pay | Admitting: Hematology & Oncology

## 2022-06-28 ENCOUNTER — Other Ambulatory Visit: Payer: Self-pay | Admitting: Hematology & Oncology

## 2022-06-28 DIAGNOSIS — C9000 Multiple myeloma not having achieved remission: Secondary | ICD-10-CM

## 2022-06-28 NOTE — Progress Notes (Signed)
DISCONTINUE ON PATHWAY REGIMEN - Multiple Myeloma and Other Plasma Cell Dyscrasias   DaraVRd (Daratumumab IV + Bortezomib SUBQ + Lenalidomide PO + Dexamethasone IV/PO) q21 Days (Induction Schema):   A cycle is every 21 days:     Lenalidomide      Dexamethasone      Bortezomib      Daratumumab    DaraVRd (Daratumumab IV + Bortezomib SUBQ + Lenalidomide PO + Dexamethasone IV/PO) q21 Days (Consolidation Schema):   A cycle is every 21 days:     Lenalidomide      Dexamethasone      Bortezomib      Daratumumab   **Always confirm dose/schedule in your pharmacy ordering system**  REASON: Other Reason PRIOR TREATMENT: [id number]: DaraVRd (Daratumumab 16 mg/kg IV + Bortezomib 1.3 mg/m2 SUBQ D1, 4, 8, 11 + Lenalidomide 25 mg PO + Dexamethasone 20 mg PO/IV) q21 Days TREATMENT RESPONSE: Unable to Evaluate  START OFF PATHWAY REGIMEN - Multiple Myeloma and Other Plasma Cell Dyscrasias   [id number]:Daratumumab SUBQ q28 Days:   Cycles 1 and 2: A cycle is every 28 days:     Daratumumab and hyaluronidase-fihj    Cycles 3 through 6: A cycle is every 28 days:     Daratumumab and hyaluronidase-fihj    Cycles 7 and beyond: A cycle is every 28 days:     Daratumumab and hyaluronidase-fihj   **Always confirm dose/schedule in your pharmacy ordering system**  Patient Characteristics: Multiple Myeloma, Post-Transplant Maintenance Therapy, High Risk Disease Classification: Multiple Myeloma R-ISS Staging: III Therapeutic Status: Post-transplant Maintenance Therapy Risk Status: High Risk Intent of Therapy: Curative Intent, Discussed with Patient

## 2022-07-01 ENCOUNTER — Other Ambulatory Visit: Payer: Self-pay

## 2022-07-06 ENCOUNTER — Inpatient Hospital Stay: Payer: Medicare Other

## 2022-07-06 ENCOUNTER — Encounter: Payer: Self-pay | Admitting: Hematology & Oncology

## 2022-07-06 ENCOUNTER — Other Ambulatory Visit: Payer: Self-pay

## 2022-07-06 ENCOUNTER — Inpatient Hospital Stay (HOSPITAL_BASED_OUTPATIENT_CLINIC_OR_DEPARTMENT_OTHER): Payer: Medicare Other | Admitting: Hematology & Oncology

## 2022-07-06 ENCOUNTER — Inpatient Hospital Stay: Payer: Medicare Other | Attending: Hematology & Oncology

## 2022-07-06 VITALS — BP 154/63 | HR 51

## 2022-07-06 DIAGNOSIS — Z5112 Encounter for antineoplastic immunotherapy: Secondary | ICD-10-CM | POA: Insufficient documentation

## 2022-07-06 DIAGNOSIS — C9000 Multiple myeloma not having achieved remission: Secondary | ICD-10-CM

## 2022-07-06 LAB — CBC WITH DIFFERENTIAL (CANCER CENTER ONLY)
Abs Immature Granulocytes: 0.05 10*3/uL (ref 0.00–0.07)
Basophils Absolute: 0 10*3/uL (ref 0.0–0.1)
Basophils Relative: 0 %
Eosinophils Absolute: 0.2 10*3/uL (ref 0.0–0.5)
Eosinophils Relative: 3 %
HCT: 41.6 % (ref 39.0–52.0)
Hemoglobin: 13.9 g/dL (ref 13.0–17.0)
Immature Granulocytes: 1 %
Lymphocytes Relative: 18 %
Lymphs Abs: 1.2 10*3/uL (ref 0.7–4.0)
MCH: 32.6 pg (ref 26.0–34.0)
MCHC: 33.4 g/dL (ref 30.0–36.0)
MCV: 97.7 fL (ref 80.0–100.0)
Monocytes Absolute: 0.8 10*3/uL (ref 0.1–1.0)
Monocytes Relative: 12 %
Neutro Abs: 4.3 10*3/uL (ref 1.7–7.7)
Neutrophils Relative %: 66 %
Platelet Count: 163 10*3/uL (ref 150–400)
RBC: 4.26 MIL/uL (ref 4.22–5.81)
RDW: 12.7 % (ref 11.5–15.5)
WBC Count: 6.5 10*3/uL (ref 4.0–10.5)
nRBC: 0 % (ref 0.0–0.2)

## 2022-07-06 LAB — CMP (CANCER CENTER ONLY)
ALT: 16 U/L (ref 0–44)
AST: 12 U/L — ABNORMAL LOW (ref 15–41)
Albumin: 4.2 g/dL (ref 3.5–5.0)
Alkaline Phosphatase: 69 U/L (ref 38–126)
Anion gap: 6 (ref 5–15)
BUN: 21 mg/dL (ref 8–23)
CO2: 28 mmol/L (ref 22–32)
Calcium: 9.9 mg/dL (ref 8.9–10.3)
Chloride: 107 mmol/L (ref 98–111)
Creatinine: 1.02 mg/dL (ref 0.61–1.24)
GFR, Estimated: >60 mL/min (ref >60)
Glucose, Bld: 82 mg/dL (ref 70–99)
Potassium: 4.5 mmol/L (ref 3.5–5.1)
Sodium: 141 mmol/L (ref 135–145)
Total Bilirubin: 0.2 mg/dL — ABNORMAL LOW (ref 0.3–1.2)
Total Protein: 5.9 g/dL — ABNORMAL LOW (ref 6.5–8.1)

## 2022-07-06 LAB — LACTATE DEHYDROGENASE: LDH: 133 U/L (ref 98–192)

## 2022-07-06 MED ORDER — DARATUMUMAB-HYALURONIDASE-FIHJ 1800-30000 MG-UT/15ML ~~LOC~~ SOLN
1800.0000 mg | Freq: Once | SUBCUTANEOUS | Status: AC
  Administered 2022-07-06: 1800 mg via SUBCUTANEOUS
  Filled 2022-07-06: qty 15

## 2022-07-06 MED ORDER — FAMOTIDINE 20 MG PO TABS: 40.0000 mg | ORAL_TABLET | Freq: Once | ORAL | Status: DC

## 2022-07-06 MED ORDER — DEXAMETHASONE 4 MG PO TABS: 20.0000 mg | ORAL_TABLET | Freq: Once | ORAL | Status: DC

## 2022-07-06 MED ORDER — DIPHENHYDRAMINE HCL 25 MG PO CAPS: 50.0000 mg | ORAL_CAPSULE | Freq: Once | ORAL | Status: DC

## 2022-07-06 MED ORDER — ACETAMINOPHEN 325 MG PO TABS: 650.0000 mg | ORAL_TABLET | Freq: Once | ORAL | Status: DC

## 2022-07-06 NOTE — Progress Notes (Signed)
Hematology and Oncology Follow Up Visit  Bruce Smith [id number] April 04, 1951 71 y.o. 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 is here today with his wife for follow-up.  He is doing okay.  He is having some more problems with his left hip.  He apparently did have an MRI done by his neurosurgeon.  I do not have the result back as of yet.  He probably needs to go back to see the orthopedic surgeon who did his left hip repair.  Maybe, there is some thing that could have come loose with respect to the hardware in the hip.  As far as his myeloma is concerned, he is done well with this.  He has had no issues with respect to recurrence of the myeloma.  His last Kappa light chain level was 0.5 mg/dL.  He has had no change in bowel or bladder habits.  He has had no leg swelling.  He has had no cough or shortness of breath.  He and his wife will be going out to [place] in October to go deer hunting.  Overall, I would say his performance status is probably ECOG 1.    Medications:  Allergies as of 07/06/2022   No Known Allergies      Medication List        Accurate as of July 06, 2022  9:39 AM. If you have any questions, ask your nurse or doctor.          acetaminophen 650 MG CR tablet Commonly known as: TYLENOL Take 1,300 mg by mouth in the morning, at noon, in the evening, and at bedtime.    acyclovir 400 MG tablet Commonly known as: ZOVIRAX Take 1 tablet (400 mg total) by mouth 2 (two) times daily.   aspirin EC 81 MG tablet Take by mouth.   calcium carbonate 1250 (500 Ca) MG tablet Commonly known as: OS-CAL - dosed in mg of elemental calcium Take 1 tablet (500 mg of elemental calcium total) by mouth 2 (two) times daily with a meal.   celecoxib 100 MG capsule Commonly known as: CELEBREX TAKE 1 CAPSULE BY MOUTH 2 TIMES DAILY WITH FOOD AS NEEDED FOR PAIN & INFLAMMATION   dronabinol 5 MG capsule Commonly known as: MARINOL TAKE 1 CAPSULE BY MOUTH TWICE DAILY BEFORE A MEAL   levETIRAcetam 500 MG tablet Commonly known as: KEPPRA Take 1 tablet (500 mg total) by mouth 2 (two) times daily.   loperamide 2 MG capsule Commonly known as: IMODIUM Take 1 capsule (2 mg total) by mouth as needed for diarrhea or loose stools.   metoprolol succinate 25 MG 24 hr tablet Commonly known as: TOPROL-XL Take 25 mg by mouth daily.   ondansetron 8 MG tablet Commonly known as: ZOFRAN TAKE 1 TABLET BY MOUTH EVERY 8 HOURS AS NEEDED NAUSEA AND VOMITING  pantoprazole 40 MG tablet Commonly known as: PROTONIX TAKE 1 TABLET BY MOUTH 2 TIMES A DAY BEFORE MEALS FOR ACID REFLUX   polyethylene glycol powder 17 GM/SCOOP powder Commonly known as: GLYCOLAX/MIRALAX Take by mouth daily.   pregabalin 200 MG capsule Commonly known as: LYRICA Take 1 capsule (200 mg total) by mouth 2 (two) times daily.   tamsulosin 0.4 MG Caps capsule Commonly known as: FLOMAX TAKE 1 CAPSULE BY MOUTH AT BEDTIME   traMADol 50 MG tablet Commonly known as: ULTRAM TAKE TWO TABLETS BY MOUTH EVERY 6 HOURS AS NEEDED FOR PAIN        Allergies: No Known Allergies  Past Medical History, Surgical history, Social history, and Family History were reviewed and updated.  Review of Systems: Review of Systems  Constitutional: Negative.   HENT: Negative.    Eyes: Negative.   Respiratory: Negative.     Cardiovascular: Negative.   Gastrointestinal: Negative.   Genitourinary: Negative.   Musculoskeletal:  Positive for joint pain.  Skin: Negative.   Neurological: Negative.   Endo/Heme/Allergies: Negative.   Psychiatric/Behavioral: Negative.       Physical Exam:  height is '5\' 8"'$  (1.727 m) and weight is 197 lb (89.4 kg). His oral temperature is 98.1 F (36.7 C). His blood pressure is 148/60 (abnormal) and his pulse is 54 (abnormal). His respiration is 18 and oxygen saturation is 97%.   Wt Readings from Last 3 Encounters:  07/06/22 197 lb (89.4 kg)  06/08/22 197 lb (89.4 kg)  05/10/22 193 lb 4 oz (87.7 kg)    Physical Exam Vitals reviewed.  HENT:     Head: Normocephalic and atraumatic.  Eyes:     Pupils: Pupils are equal, round, and reactive to light.  Cardiovascular:     Rate and Rhythm: Normal rate and regular rhythm.     Heart sounds: Normal heart sounds.  Pulmonary:     Effort: Pulmonary effort is normal.     Breath sounds: Normal breath sounds.  Abdominal:     General: Bowel sounds are normal.     Palpations: Abdomen is soft.  Musculoskeletal:        General: No tenderness or deformity. Normal range of motion.     Cervical back: Normal range of motion.  Lymphadenopathy:     Cervical: No cervical adenopathy.  Skin:    General: Skin is warm and dry.     Findings: No erythema or rash.  Neurological:     Mental Status: He is alert and oriented to person, place, and time.  Psychiatric:        Behavior: Behavior normal.        Thought Content: Thought content normal.        Judgment: Judgment normal.     Lab Results  Component Value Date   WBC 6.5 07/06/2022   HGB 13.9 07/06/2022   HCT 41.6 07/06/2022   MCV 97.7 07/06/2022   PLT 163 07/06/2022   Lab Results  Component Value Date   FERRITIN 938 (H) 02/23/2021   IRON 67 02/23/2021   TIBC 156 (L) 02/23/2021   UIBC 90 (L) 02/23/2021   IRONPCTSAT 43 02/23/2021   Lab Results  Component Value Date   RBC  4.26 07/06/2022   Lab Results  Component Value Date   KPAFRELGTCHN 5.3 06/08/2022   LAMBDASER 5.2 (L) 06/08/2022   KAPLAMBRATIO 1.02 06/08/2022   Lab Results  Component Value Date   IGGSERUM 314 (L) 06/08/2022   IGA 17 (L) 06/08/2022  IGMSERUM 9 (L) 06/08/2022   Lab Results  Component Value Date   TOTALPROTELP 5.7 (L) 06/08/2022   ALBUMINELP 3.7 06/08/2022   A1GS 0.2 06/08/2022   A2GS 0.8 06/08/2022   BETS 0.7 06/08/2022   GAMS 0.2 (L) 06/08/2022   MSPIKE Not Observed 06/08/2022     Chemistry      Component Value Date/Time   NA 141 07/06/2022 0852   K 4.5 07/06/2022 0852   CL 107 07/06/2022 0852   CO2 28 07/06/2022 0852   BUN 21 07/06/2022 0852   CREATININE 1.02 07/06/2022 0852      Component Value Date/Time   CALCIUM 9.9 07/06/2022 0852   ALKPHOS 69 07/06/2022 0852   AST 12 (L) 07/06/2022 0852   ALT 16 07/06/2022 0852   BILITOT 0.2 (L) 07/06/2022 0852       Impression and Plan: Mr. Bright is a very pleasant 71 yo gentleman with 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   We will go ahead with his Faspro today.  He does not need Delton See I think until October.  Again, I would not think that there is anything related to myeloma that could be causing the hip problem.  We really have to get the MRI of the lower back to see what is going on there.  I will plan to see him back in another month.  Volanda Napoleon, MD 9/6/20239:39 AM

## 2022-07-06 NOTE — Patient Instructions (Signed)
[facility]  Discharge Instructions: Thank you for choosing [facility] to provide your oncology and hematology care.   If you have a lab appointment with the Cancer Center, please go directly to the Cancer Center and check in at the registration area.  Wear comfortable clothing and clothing appropriate for easy access to any Portacath or PICC line.   We strive to give you quality time with your provider. You may need to reschedule your appointment if you arrive late (15 or more minutes).  Arriving late affects you and other patients whose appointments are after yours.  Also, if you miss three or more appointments without notifying the office, you may be dismissed from the clinic at the provider's discretion.      For prescription refill requests, have your pharmacy contact our office and allow 72 hours for refills to be completed.    Today you received the following chemotherapy and/or immunotherapy agents:  Faspro      To help prevent nausea and vomiting after your treatment, we encourage you to take your nausea medication as directed.  BELOW ARE SYMPTOMS THAT SHOULD BE REPORTED IMMEDIATELY: *FEVER GREATER THAN 100.4 F (38 C) OR HIGHER *CHILLS OR SWEATING *NAUSEA AND VOMITING THAT IS NOT CONTROLLED WITH YOUR NAUSEA MEDICATION *UNUSUAL SHORTNESS OF BREATH *UNUSUAL BRUISING OR BLEEDING *URINARY PROBLEMS (pain or burning when urinating, or frequent urination) *BOWEL PROBLEMS (unusual diarrhea, constipation, pain near the anus) TENDERNESS IN MOUTH AND THROAT WITH OR WITHOUT PRESENCE OF ULCERS (sore throat, sores in mouth, or a toothache) UNUSUAL RASH, SWELLING OR PAIN  UNUSUAL VAGINAL DISCHARGE OR ITCHING   Items with * indicate a potential emergency and should be followed up as soon as possible or go to the Emergency Department if any problems should occur.  Please show the CHEMOTHERAPY ALERT CARD or IMMUNOTHERAPY ALERT CARD at check-in to the  Emergency Department and triage nurse. Should you have questions after your visit or need to cancel or reschedule your appointment, please contact [facility]  [phone] and follow the prompts.  Office hours are 8:00 a.m. to 4:30 p.m. Monday - Friday. Please note that voicemails left after 4:00 p.m. may not be returned until the following business day.  We are closed weekends and major holidays. You have access to a nurse at all times for urgent questions. Please call the main number to the clinic [phone] and follow the prompts.  For any non-urgent questions, you may also contact your provider using MyChart. We now offer e-Visits for anyone 18 and older to request care online for non-urgent symptoms. For details visit [email or web address].   Also download the MyChart app! Go to the app store, search "MyChart", open the app, select [facility], and log in with your MyChart username and password.  Masks are optional in the cancer centers. If you would like for your care team to wear a mask while they are taking care of you, please let them know. You may have one support person who is at least 71 years old accompany you for your appointments. 

## 2022-07-07 LAB — IGG, IGA, IGM
IgA: 14 mg/dL — ABNORMAL LOW (ref 61–437)
IgG (Immunoglobin G), Serum: 271 mg/dL — ABNORMAL LOW (ref 603–1613)
IgM (Immunoglobulin M), Srm: 6 mg/dL — ABNORMAL LOW (ref 15–143)

## 2022-07-07 LAB — KAPPA/LAMBDA LIGHT CHAINS
Kappa free light chain: 5.3 mg/L (ref 3.3–19.4)
Kappa, lambda light chain ratio: 1.39 (ref 0.26–1.65)
Lambda free light chains: 3.8 mg/L — ABNORMAL LOW (ref 5.7–26.3)

## 2022-07-09 ENCOUNTER — Other Ambulatory Visit: Payer: Self-pay

## 2022-07-11 ENCOUNTER — Other Ambulatory Visit: Payer: Self-pay | Admitting: Physical Medicine and Rehabilitation

## 2022-07-11 ENCOUNTER — Other Ambulatory Visit: Payer: Self-pay | Admitting: Hematology & Oncology

## 2022-07-11 DIAGNOSIS — E440 Moderate protein-calorie malnutrition: Secondary | ICD-10-CM

## 2022-07-11 DIAGNOSIS — C9000 Multiple myeloma not having achieved remission: Secondary | ICD-10-CM

## 2022-07-11 DIAGNOSIS — R627 Adult failure to thrive: Secondary | ICD-10-CM

## 2022-07-13 ENCOUNTER — Other Ambulatory Visit: Payer: Self-pay

## 2022-07-14 LAB — IMMUNOFIXATION REFLEX, SERUM
IgA: 15 mg/dL — ABNORMAL LOW (ref 61–437)
IgG (Immunoglobin G), Serum: 303 mg/dL — ABNORMAL LOW (ref 603–1613)
IgM (Immunoglobulin M), Srm: 6 mg/dL — ABNORMAL LOW (ref 15–143)

## 2022-07-14 LAB — PROTEIN ELECTROPHORESIS, SERUM, WITH REFLEX
A/G Ratio: 1.8 — ABNORMAL HIGH (ref 0.7–1.7)
Albumin ELP: 3.5 g/dL (ref 2.9–4.4)
Alpha-1-Globulin: 0.2 g/dL (ref 0.0–0.4)
Alpha-2-Globulin: 0.8 g/dL (ref 0.4–1.0)
Beta Globulin: 0.7 g/dL (ref 0.7–1.3)
Gamma Globulin: 0.2 g/dL — ABNORMAL LOW (ref 0.4–1.8)
Globulin, Total: 1.9 g/dL — ABNORMAL LOW (ref 2.2–3.9)
M-Spike, %: 0.1 g/dL — ABNORMAL HIGH
SPEP Interpretation: 0
Total Protein ELP: 5.4 g/dL — ABNORMAL LOW (ref 6.0–8.5)

## 2022-07-15 ENCOUNTER — Other Ambulatory Visit: Payer: Self-pay | Admitting: Hematology & Oncology

## 2022-07-27 ENCOUNTER — Telehealth: Payer: Self-pay | Admitting: *Deleted

## 2022-07-27 NOTE — Telephone Encounter (Signed)
Message received from patient's wife Maudie Mercury stating that pt had an xray of his left leg ordered by Dr. Mardelle Matte which showed a "shadow" and that Dr. Mardelle Matte is suggesting that pt contact Dr. [name] to order a CT scan of the left leg.  Call placed back to [facility] and notified her to have Dr. Luanna Cole office fax a copy of the xray to our office per order of Dr. [name].  Maudie Mercury states that she is not able to write down our fax number at this time and will call us back when she is able to do so.

## 2022-07-29 ENCOUNTER — Other Ambulatory Visit: Payer: Self-pay

## 2022-08-01 ENCOUNTER — Other Ambulatory Visit: Payer: Self-pay

## 2022-08-03 ENCOUNTER — Encounter: Payer: Self-pay | Admitting: Hematology & Oncology

## 2022-08-03 ENCOUNTER — Inpatient Hospital Stay: Payer: Medicare Other

## 2022-08-03 ENCOUNTER — Other Ambulatory Visit: Payer: Self-pay

## 2022-08-03 ENCOUNTER — Inpatient Hospital Stay: Payer: Medicare Other | Attending: Hematology & Oncology

## 2022-08-03 ENCOUNTER — Inpatient Hospital Stay (HOSPITAL_BASED_OUTPATIENT_CLINIC_OR_DEPARTMENT_OTHER): Payer: Medicare Other | Admitting: Hematology & Oncology

## 2022-08-03 VITALS — BP 138/60 | HR 52 | Temp 97.9°F | Resp 18 | Ht 68.0 in | Wt 197.0 lb

## 2022-08-03 VITALS — BP 136/56 | HR 56 | Resp 17

## 2022-08-03 DIAGNOSIS — C419 Malignant neoplasm of bone and articular cartilage, unspecified: Secondary | ICD-10-CM

## 2022-08-03 DIAGNOSIS — Z9484 Stem cells transplant status: Secondary | ICD-10-CM | POA: Insufficient documentation

## 2022-08-03 DIAGNOSIS — Z5112 Encounter for antineoplastic immunotherapy: Secondary | ICD-10-CM | POA: Diagnosis present

## 2022-08-03 DIAGNOSIS — C9000 Multiple myeloma not having achieved remission: Secondary | ICD-10-CM

## 2022-08-03 LAB — CBC WITH DIFFERENTIAL (CANCER CENTER ONLY)
Abs Immature Granulocytes: 0.06 10*3/uL (ref 0.00–0.07)
Basophils Absolute: 0 10*3/uL (ref 0.0–0.1)
Basophils Relative: 0 %
Eosinophils Absolute: 0.1 10*3/uL (ref 0.0–0.5)
Eosinophils Relative: 1 %
HCT: 41.1 % (ref 39.0–52.0)
Hemoglobin: 13.5 g/dL (ref 13.0–17.0)
Immature Granulocytes: 1 %
Lymphocytes Relative: 12 %
Lymphs Abs: 1.1 10*3/uL (ref 0.7–4.0)
MCH: 32.1 pg (ref 26.0–34.0)
MCHC: 32.8 g/dL (ref 30.0–36.0)
MCV: 97.9 fL (ref 80.0–100.0)
Monocytes Absolute: 1.1 10*3/uL — ABNORMAL HIGH (ref 0.1–1.0)
Monocytes Relative: 12 %
Neutro Abs: 6.7 10*3/uL (ref 1.7–7.7)
Neutrophils Relative %: 74 %
Platelet Count: 160 10*3/uL (ref 150–400)
RBC: 4.2 MIL/uL — ABNORMAL LOW (ref 4.22–5.81)
RDW: 13.3 % (ref 11.5–15.5)
WBC Count: 9.1 10*3/uL (ref 4.0–10.5)
nRBC: 0 % (ref 0.0–0.2)

## 2022-08-03 LAB — CMP (CANCER CENTER ONLY)
ALT: 34 U/L (ref 0–44)
AST: 16 U/L (ref 15–41)
Albumin: 4.2 g/dL (ref 3.5–5.0)
Alkaline Phosphatase: 102 U/L (ref 38–126)
Anion gap: 7 (ref 5–15)
BUN: 20 mg/dL (ref 8–23)
CO2: 27 mmol/L (ref 22–32)
Calcium: 9.1 mg/dL (ref 8.9–10.3)
Chloride: 106 mmol/L (ref 98–111)
Creatinine: 1.09 mg/dL (ref 0.61–1.24)
GFR, Estimated: >60 mL/min (ref >60)
Glucose, Bld: 72 mg/dL (ref 70–99)
Potassium: 4.6 mmol/L (ref 3.5–5.1)
Sodium: 140 mmol/L (ref 135–145)
Total Bilirubin: 0.3 mg/dL (ref 0.3–1.2)
Total Protein: 6.1 g/dL — ABNORMAL LOW (ref 6.5–8.1)

## 2022-08-03 LAB — LACTATE DEHYDROGENASE: LDH: 163 U/L (ref 98–192)

## 2022-08-03 MED ORDER — ACETAMINOPHEN 325 MG PO TABS: 650.0000 mg | ORAL_TABLET | Freq: Once | ORAL | Status: DC

## 2022-08-03 MED ORDER — ZOLEDRONIC ACID 4 MG/100ML IV SOLN
4.0000 mg | Freq: Once | INTRAVENOUS | Status: AC
  Administered 2022-08-03: 4 mg via INTRAVENOUS
  Filled 2022-08-03: qty 100

## 2022-08-03 MED ORDER — FAMOTIDINE 20 MG PO TABS: 40.0000 mg | ORAL_TABLET | Freq: Once | ORAL | Status: DC

## 2022-08-03 MED ORDER — DARATUMUMAB-HYALURONIDASE-FIHJ 1800-30000 MG-UT/15ML ~~LOC~~ SOLN
1800.0000 mg | Freq: Once | SUBCUTANEOUS | Status: AC
  Administered 2022-08-03: 1800 mg via SUBCUTANEOUS
  Filled 2022-08-03: qty 15

## 2022-08-03 MED ORDER — DIPHENHYDRAMINE HCL 25 MG PO CAPS: 50.0000 mg | ORAL_CAPSULE | Freq: Once | ORAL | Status: DC

## 2022-08-03 MED ORDER — SODIUM CHLORIDE 0.9 % IV SOLN: Freq: Once | INTRAVENOUS | Status: AC

## 2022-08-03 NOTE — Patient Instructions (Signed)
[facility]  Discharge Instructions: Thank you for choosing  [facility] to provide your oncology and hematology care.   If you have a lab appointment with the Cancer Center, please go directly to the Cancer Center and check in at the registration area.  Wear comfortable clothing and clothing appropriate for easy access to any Portacath or PICC line.   We strive to give you quality time with your provider. You may need to reschedule your appointment if you arrive late (15 or more minutes).  Arriving late affects you and other patients whose appointments are after yours.  Also, if you miss three or more appointments without notifying the office, you may be dismissed from the clinic at the provider's discretion.      For prescription refill requests, have your pharmacy contact our office and allow 72 hours for refills to be completed.    Today you received the following chemotherapy and/or immunotherapy agents:  Faspro      To help prevent nausea and vomiting after your treatment, we encourage you to take your nausea medication as directed.  BELOW ARE SYMPTOMS THAT SHOULD BE REPORTED IMMEDIATELY: *FEVER GREATER THAN 100.4 F (38 C) OR HIGHER *CHILLS OR SWEATING *NAUSEA AND VOMITING THAT IS NOT CONTROLLED WITH YOUR NAUSEA MEDICATION *UNUSUAL SHORTNESS OF BREATH *UNUSUAL BRUISING OR BLEEDING *URINARY PROBLEMS (pain or burning when urinating, or frequent urination) *BOWEL PROBLEMS (unusual diarrhea, constipation, pain near the anus) TENDERNESS IN MOUTH AND THROAT WITH OR WITHOUT PRESENCE OF ULCERS (sore throat, sores in mouth, or a toothache) UNUSUAL RASH, SWELLING OR PAIN  UNUSUAL VAGINAL DISCHARGE OR ITCHING   Items with * indicate a potential emergency and should be followed up as soon as possible or go to the Emergency Department if any problems should occur.  Please show the CHEMOTHERAPY ALERT CARD or IMMUNOTHERAPY ALERT CARD at check-in to the  Emergency Department and triage nurse. Should you have questions after your visit or need to cancel or reschedule your appointment, please contact [facility]  [phone] and follow the prompts.  Office hours are 8:00 a.m. to 4:30 p.m. Monday - Friday. Please note that voicemails left after 4:00 p.m. may not be returned until the following business day.  We are closed weekends and major holidays. You have access to a nurse at all times for urgent questions. Please call the main number to the clinic [phone] and follow the prompts.  For any non-urgent questions, you may also contact your provider using MyChart. We now offer e-Visits for anyone 18 and older to request care online for non-urgent symptoms. For details visit [email or web address].   Also download the MyChart app! Go to the app store, search "MyChart", open the app, select , and log in with your MyChart username and password.  Masks are optional in the cancer centers. If you would like for your care team to wear a mask while they are taking care of you, please let them know. You may have one support person who is at least 71 years old accompany you for your appointments. 

## 2022-08-03 NOTE — Progress Notes (Signed)
Hematology and Oncology Follow Up Visit  Bruce Smith [id number] [birth year] 71 y.o. 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  His left hip continues to bother him.  He saw his Orthopedic surgeon.  X-rays were taken.  Everything looked fine with the x-ray.  However, the orthopedic surgeon cannot be sure if there was a "shadow".  I am unsure exactly what this means.  We probably will have to get a PET scan on him to see if there is any active myeloma.  I would be surprised if there was.  He and his wife are planning on going to [place] this weekend and then going up to [place] for a week or so.  He will be deer hunting.  I do not see a problem doing this.  His last Kappa light chain level was 0.5 mg/dL.  He has had no fever.  He still has had a little bit of a cough.  It is nonproductive.  There is no change in bowel or bladder habits.  He has had no bleeding.  He has had no leg swelling.  There has been no rashes.  He has had no headache.  Overall, I would say his performance status is probably ECOG 1.   Medications:  Allergies as of 08/03/2022   No Known Allergies      Medication List        Accurate as of August 03, 2022  9:28 AM. If you have any questions, ask your nurse or doctor.          acetaminophen 650 MG CR tablet Commonly  known as: TYLENOL Take 1,300 mg by mouth in the morning, at noon, in the evening, and at bedtime.   acyclovir 400 MG tablet Commonly known as: ZOVIRAX Take 1 tablet (400 mg total) by mouth 2 (two) times daily.   aspirin EC 81 MG tablet Take by mouth.   calcium carbonate 1250 (500 Ca) MG tablet Commonly known as: OS-CAL - dosed in mg of elemental calcium Take 1 tablet (500 mg of elemental calcium total) by mouth 2 (two) times daily with a meal.   celecoxib 100 MG capsule Commonly known as: CELEBREX TAKE 1 CAPSULE BY MOUTH 2 TIMES DAILY WITH FOOD AS NEEDED FOR PAIN & INFLAMMATION   dronabinol 5 MG capsule Commonly known as: MARINOL TAKE 1 CAPSULE BY MOUTH TWICE DAILY BEFORE A MEAL   levETIRAcetam 500 MG tablet Commonly known as: KEPPRA TAKE 1 TABLET BY MOUTH 2 TIMES A DAY   loperamide 2 MG capsule Commonly known as: IMODIUM Take 1 capsule (2 mg total) by mouth as needed for diarrhea or loose stools.   metoprolol succinate 25 MG 24 hr tablet Commonly known as: TOPROL-XL Take 25 mg by mouth daily.   ondansetron 8 MG tablet Commonly known  as: ZOFRAN TAKE 1 TABLET BY MOUTH EVERY 8 HOURS AS NEEDED NAUSEA AND VOMITING   pantoprazole 40 MG tablet Commonly known as: PROTONIX TAKE 1 TABLET BY MOUTH 2 TIMES A DAY BEFORE MEALS FOR ACID REFLUX   polyethylene glycol powder 17 GM/SCOOP powder Commonly known as: GLYCOLAX/MIRALAX Take by mouth daily.   pregabalin 200 MG capsule Commonly known as: LYRICA Take 1 capsule (200 mg total) by mouth 2 (two) times daily.   tamsulosin 0.4 MG Caps capsule Commonly known as: FLOMAX TAKE 1 CAPSULE BY MOUTH AT BEDTIME   traMADol 50 MG tablet Commonly known as: ULTRAM TAKE TWO TABLETS BY MOUTH EVERY 6 HOURS AS NEEDED FOR PAIN        Allergies: No Known Allergies  Past Medical History, Surgical history, Social history, and Family History were reviewed and updated.  Review of Systems: Review of Systems  Constitutional: Negative.    HENT: Negative.    Eyes: Negative.   Respiratory: Negative.    Cardiovascular: Negative.   Gastrointestinal: Negative.   Genitourinary: Negative.   Musculoskeletal:  Positive for joint pain.  Skin: Negative.   Neurological: Negative.   Endo/Heme/Allergies: Negative.   Psychiatric/Behavioral: Negative.       Physical Exam:  height is '5\' 8"'$  (1.727 m) and weight is 197 lb (89.4 kg). His oral temperature is 97.9 F (36.6 C). His blood pressure is 138/60 and his pulse is 52 (abnormal). His respiration is 18 and oxygen saturation is 100%.   Wt Readings from Last 3 Encounters:  08/03/22 197 lb (89.4 kg)  07/06/22 197 lb (89.4 kg)  06/08/22 197 lb (89.4 kg)    Physical Exam Vitals reviewed.  HENT:     Head: Normocephalic and atraumatic.  Eyes:     Pupils: Pupils are equal, round, and reactive to light.  Cardiovascular:     Rate and Rhythm: Normal rate and regular rhythm.     Heart sounds: Normal heart sounds.  Pulmonary:     Effort: Pulmonary effort is normal.     Breath sounds: Normal breath sounds.  Abdominal:     General: Bowel sounds are normal.     Palpations: Abdomen is soft.  Musculoskeletal:        General: No tenderness or deformity. Normal range of motion.     Cervical back: Normal range of motion.     Comments: There is pain to palpation in the left hip area.  This is in the lateral hip area.  He has some decreased range of motion.  Lymphadenopathy:     Cervical: No cervical adenopathy.  Skin:    General: Skin is warm and dry.     Findings: No erythema or rash.  Neurological:     Mental Status: He is alert and oriented to person, place, and time.  Psychiatric:        Behavior: Behavior normal.        Thought Content: Thought content normal.        Judgment: Judgment normal.      Lab Results  Component Value Date   WBC 9.1 08/03/2022   HGB 13.5 08/03/2022   HCT 41.1 08/03/2022   MCV 97.9 08/03/2022   PLT 160 08/03/2022   Lab Results  Component  Value Date   FERRITIN 938 (H) 02/23/2021   IRON 67 02/23/2021   TIBC 156 (L) 02/23/2021   UIBC 90 (L) 02/23/2021   IRONPCTSAT 43 02/23/2021   Lab Results  Component Value Date   RBC 4.20 (L)  08/03/2022   Lab Results  Component Value Date   KPAFRELGTCHN 5.3 07/06/2022   LAMBDASER 3.8 (L) 07/06/2022   KAPLAMBRATIO 1.39 07/06/2022   Lab Results  Component Value Date   IGGSERUM 303 (L) 07/06/2022   IGA 15 (L) 07/06/2022   IGMSERUM 6 (L) 07/06/2022   Lab Results  Component Value Date   TOTALPROTELP 5.4 (L) 07/06/2022   ALBUMINELP 3.5 07/06/2022   A1GS 0.2 07/06/2022   A2GS 0.8 07/06/2022   BETS 0.7 07/06/2022   GAMS 0.2 (L) 07/06/2022   MSPIKE 0.1 (H) 07/06/2022     Chemistry      Component Value Date/Time   NA 140 08/03/2022 0835   K 4.6 08/03/2022 0835   CL 106 08/03/2022 0835   CO2 27 08/03/2022 0835   BUN 20 08/03/2022 0835   CREATININE 1.09 08/03/2022 0835      Component Value Date/Time   CALCIUM 9.1 08/03/2022 0835   ALKPHOS 102 08/03/2022 0835   AST 16 08/03/2022 0835   ALT 34 08/03/2022 0835   BILITOT 0.3 08/03/2022 0835       Impression and Plan: Mr. Sponaugle is a very pleasant 71 yo gentleman with 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   He will get both Faspro and [place] today.  We will have to set him up with a PET scan.  I think a PET scan would certainly be the best way to go to try to sort out if there is any active myeloma in that left hip area.  We will get this when he comes back from his trip to [place].  Hopefully, there is some that we can do for this pain.  I know he is on tramadol which does seem to help a little bit.    Volanda Napoleon, MD 10/4/20239:28 AM

## 2022-08-03 NOTE — Progress Notes (Signed)
Pt declined to stay for post infusion observation period. Pt stated he has tolerated medication multiple times prior without difficulty. Pt aware to call clinic with any questions or concerns. Pt verbalized understanding and had no further questions.   

## 2022-08-04 LAB — KAPPA/LAMBDA LIGHT CHAINS
Kappa free light chain: 5.4 mg/L (ref 3.3–19.4)
Kappa, lambda light chain ratio: 1.64 (ref 0.26–1.65)
Lambda free light chains: 3.3 mg/L — ABNORMAL LOW (ref 5.7–26.3)

## 2022-08-04 NOTE — Addendum Note (Signed)
Addended by: Volanda Napoleon on: 08/04/2022 09:43 AM   Modules accepted: Orders

## 2022-08-04 NOTE — Addendum Note (Signed)
Addended by: Volanda Napoleon on: 08/04/2022 09:33 AM   Modules accepted: Orders

## 2022-08-05 ENCOUNTER — Other Ambulatory Visit: Payer: Self-pay

## 2022-08-05 LAB — IGG, IGA, IGM
IgA: 15 mg/dL — ABNORMAL LOW (ref 61–437)
IgG (Immunoglobin G), Serum: 331 mg/dL — ABNORMAL LOW (ref 603–1613)
IgM (Immunoglobulin M), Srm: 7 mg/dL — ABNORMAL LOW (ref 15–143)

## 2022-08-07 ENCOUNTER — Other Ambulatory Visit: Payer: Self-pay

## 2022-08-08 ENCOUNTER — Inpatient Hospital Stay: Payer: Medicare Other

## 2022-08-08 DIAGNOSIS — C9000 Multiple myeloma not having achieved remission: Secondary | ICD-10-CM

## 2022-08-08 DIAGNOSIS — Z5112 Encounter for antineoplastic immunotherapy: Secondary | ICD-10-CM | POA: Diagnosis not present

## 2022-08-08 LAB — PROTEIN ELECTROPHORESIS, SERUM, WITH REFLEX
A/G Ratio: 1.7 (ref 0.7–1.7)
Albumin ELP: 3.6 g/dL (ref 2.9–4.4)
Alpha-1-Globulin: 0.2 g/dL (ref 0.0–0.4)
Alpha-2-Globulin: 0.8 g/dL (ref 0.4–1.0)
Beta Globulin: 0.7 g/dL (ref 0.7–1.3)
Gamma Globulin: 0.4 g/dL (ref 0.4–1.8)
Globulin, Total: 2.1 g/dL — ABNORMAL LOW (ref 2.2–3.9)
Total Protein ELP: 5.7 g/dL — ABNORMAL LOW (ref 6.0–8.5)

## 2022-08-11 LAB — UPEP/UIFE/LIGHT CHAINS/TP, 24-HR UR
% BETA, Urine: 0 %
ALPHA 1 URINE: 0 %
Albumin, U: 100 %
Alpha 2, Urine: 0 %
Free Kappa Lt Chains,Ur: 2.52 mg/L (ref 1.17–86.46)
Free Kappa/Lambda Ratio: >3.65 (ref 1.83–14.26)
Free Lambda Lt Chains,Ur: <0.69 mg/L (ref 0.27–15.21)
GAMMA GLOBULIN URINE: 0 %
Total Protein, Urine-Ur/day: 104 mg/24 hr (ref 30–150)
Total Protein, Urine: 5.2 mg/dL
Total Volume: 2000

## 2022-08-12 ENCOUNTER — Other Ambulatory Visit: Payer: Self-pay

## 2022-08-18 ENCOUNTER — Other Ambulatory Visit: Payer: Self-pay | Admitting: Hematology & Oncology

## 2022-08-18 ENCOUNTER — Ambulatory Visit (HOSPITAL_COMMUNITY)
Admission: RE | Admit: 2022-08-18 | Discharge: 2022-08-18 | Disposition: A | Discharge status: Home / Self Care | Payer: Medicare Other | Source: Ambulatory Visit | Attending: Hematology & Oncology | Admitting: Hematology & Oncology

## 2022-08-18 DIAGNOSIS — C9000 Multiple myeloma not having achieved remission: Secondary | ICD-10-CM

## 2022-08-18 LAB — GLUCOSE, CAPILLARY: Glucose-Capillary: 85 mg/dL (ref 70–99)

## 2022-08-18 MED ORDER — FLUDEOXYGLUCOSE F - 18 (FDG) INJECTION
9.8000 | Freq: Once | INTRAVENOUS | Status: AC
  Administered 2022-08-18: 10.3 via INTRAVENOUS

## 2022-08-24 ENCOUNTER — Other Ambulatory Visit: Payer: Self-pay | Admitting: Hematology & Oncology

## 2022-08-25 ENCOUNTER — Telehealth: Payer: Self-pay | Admitting: *Deleted

## 2022-08-25 NOTE — Telephone Encounter (Signed)
Call received from patient's wife this morning requesting PET scan results due to they are not able to view the results on MyChart.  Informed pt's wife that Dr. [name] is out of the office this week and that I would check into getting access to the PET scan results on MyChart for them to view and would contact them if I heard from Dr [name] about results.  Call placed back to patients wife and message left to inform her that PET scan results are now able to view on MyChart and that Dr [name] did review PET scan results and would like to speak with Dr. Elroy Channel at [facility] before next steps.  Informed pt to call office back with any further questions or concerns.

## 2022-08-29 ENCOUNTER — Other Ambulatory Visit: Payer: Self-pay | Admitting: Hematology & Oncology

## 2022-08-29 DIAGNOSIS — C419 Malignant neoplasm of bone and articular cartilage, unspecified: Secondary | ICD-10-CM

## 2022-08-31 ENCOUNTER — Other Ambulatory Visit: Payer: Self-pay

## 2022-09-02 ENCOUNTER — Other Ambulatory Visit: Payer: Self-pay | Admitting: Hematology & Oncology

## 2022-09-02 NOTE — Progress Notes (Signed)
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  Location(s) of Symptomatic Metastases: Patient reports pain with ambulation.   Past/Anticipated chemotherapy by medical oncology, if any: ASCT -- [facility] on -01/22/2021. Faspro - q month -- maintenance -- start on 05/13/2021 and Xgeva 120 mg subcu every 3 months-next dose in 10/2022.  Pain on a scale of 0-10 is: 6    If Spine Met(s), symptoms, if any, include: Bowel/Bladder retention or incontinence (please describe): No, patient currently on Flomax daily.  Numbness or weakness in extremities (please describe): No Current Decadron regimen, if applicable: none prescribed. NA   Ambulatory status? Walker? Wheelchair?: Ambulatory with cane Ambulatory SAFETY ISSUES: Prior radiation?  Radiation Treatment Dates: 07/28/2020 through 08/10/2020 Site Technique Total Dose (Gy) Dose per Fx (Gy) Completed Fx Beam Energies  Scapula, Left: Chest_Lt 3D 30/30 3 10/10 10X, 15X  Sacro-Iliac Joints: Pelvis 3D 30/30 3 10/10 10X, 15X  Femur Left: Ext_Lt Complex 30/30 3 10/10 10X, 15X   Pacemaker/ICD? no Possible current pregnancy? no Is the patient on methotrexate? no  Current Complaints / other details:  Patient reports tingling in the feet. Patient has diagnosis of neuropathy.    BP (!) 147/69 (BP Location: Left Arm, Patient Position: Sitting)   Pulse (!) 59   Temp (!) 97.3 F (36.3 C) (Oral)   Resp 18   Ht _0  (1.727 m)   Wt 200 lb 6 oz (90.9 kg)   SpO2 97%   BMI 30.47 kg/m

## 2022-09-03 NOTE — Progress Notes (Signed)
Radiation Oncology         ([phone] ________________________________  Outpatient Re-Consultation  Name: Bruce Smith MRN: [id number]  Date: 09/05/2022  DOB: 1951-02-20  EH:[id number], Quin Hoop., MD  Volanda Napoleon, MD   REFERRING PHYSICIAN: Volanda Napoleon, MD  DIAGNOSIS: There were no encounter diagnoses.  Kappa light chain myeloma with extensive bone disease and pathologic fracture of the left scapula and left femur    Recent PET scan with new hypermetabolic lesions within the T12 vertebral body, anterior left iliac bone, and left mid humerus consistent with recurrent myelomatous involvement  Interval since last radiation treatment: 2 years and 26 days   Radiation Treatment Dates: 07/28/2020 through 08/10/2020 Site Technique Total Dose (Gy) Dose per Fx (Gy) Completed Fx Beam Energies  Scapula, Left: Chest_Lt 3D 30/30 3 10/10 10X, 15X  Sacro-Iliac Joints: Pelvis 3D 30/30 3 10/10 10X, 15X  Femur Left: Ext_Lt Complex 30/30 3 10/10 10X, 15X     HISTORY OF PRESENT ILLNESS::Bruce Smith is a 71 y.o. male who is accompanied by ***. he is seen as a courtesy of Dr. [name] for re-evaluation and for an opinion concerning radiation therapy as part of management for his recently diagnosed/new hypermetabolic lesions involving the T12 vertebral body, anterior left iliac bone, and left mid humerus from multiple myeloma primary. I last met with the patient on 09/10/2020 for routine follow-up following radiation to the left hip, femur, and left scapula. Since that time, the patient has continued on with treatment consisting of an ASCT at [facility] on 01/22/21, Faspro maintenance since 05/13/22, and xgeva every 3 months.   Shortly after his last follow-up visit, the patient had a PET scan performed on 10/19/2020 which showed significant interval improvement with no new lesions. A bone marrow biopsy performed on 10/19/20 was hypocellular with no excess plasma cells detected. On 11/16/20, the patient  unfortunately suffered a fall and injured his left knee. He was found to have a lytic lesion in the proximal fibula with a fracture along the media aspect of the lesion.   Earlier this year, the patient followed up with Dr. Laverta Baltimore at the [facility] clinic on 01/05/22. During which time, the patient reported lower back pain, worsening left hip and thigh pain since January. Given his pain, he reported difficulty walking long distances due to pain. He reported still being able to perform ADL's but not without pain while bending and standing for long periods of time. In terms of his multiple myeloma, labs reviewed showed stable findings overall.   In most recent history, the patient followed up with Dr. [name] on 08/03/22. During which time, the patient reported ongoing left hip pain. He stated that he saw his orthopedic surgeon for this and had x-rays taken which showed mixed findings, but no definite abnormality.   To rule out any active multiple myeloma, Dr. [name] ordered a restaging PET scan. PET scan performed on 08/18/22 showed new hypermetabolic lesions within the T12 vertebral body, anterior left iliac bone, and left mid humerus consistent with recurrent myelomatous involvement. No other hypermetabolic osseous lesions were identified, asides from scattered treated osseous lesions which remain unchanged without recurrent metabolic activity. PET also showed: evolving post radiation changes in the left upper lobe and superior segment of the left lower lobe, resolution of previously demonstrated hypermetabolic activity in the area, and a new 8 mm part solid left lower lobe pulmonary nodule without hypermetabolic activity, possibly inflammatory in etiology.   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   Multiple myeloma ([place]) 07/21/2020   Myocardial infarction [facility])     PAST SURGICAL HISTORY: Past Surgical History:  Procedure Laterality Date   ANTERIOR CERVICAL DECOMP/DISCECTOMY FUSION N/A 06/17/2020   Procedure: ANTERIOR CERVICAL DECOMPRESSION/DISCECTOMY FUSION, INTERBODY PROSTHESIS, PLATE/SCREWS CERVICAL FIVE- CERVICAL SIX;  Surgeon: Newman Pies, MD;  Location: [facility];  Service: Neurosurgery;  Laterality: N/A;  ANTERIOR CERVICAL DECOMPRESSION/DISCECTOMY FUSION, INTERBODY PROSTHESIS, PLATE/SCREWS CERVICAL FIVE- CERVICAL SIX   BIOPSY  02/24/2021   Procedure: BIOPSY;  Surgeon: Otis Brace, MD;  Location: [facility];  Service: Gastroenterology;;   CARDIAC CATHETERIZATION     ESOPHAGOGASTRODUODENOSCOPY N/A 02/24/2021   Procedure: ESOPHAGOGASTRODUODENOSCOPY (EGD);  Surgeon: Otis Brace, MD;  Location: [facility];  Service: Gastroenterology;  Laterality: N/A;   FEMUR IM NAIL Left 07/14/2020   Procedure: OPEN REDUCTION INTERNAL FIXATION (ORIF LEFT FEMORAL NAIL FRACTURE;  Surgeon: Marchia Bond, MD;  Location: [facility];  Service: Orthopedics;  Laterality: Left;    FAMILY HISTORY:  Family History  Problem Relation Age of Onset   Stroke Mother    Prostate cancer Paternal Grandfather     SOCIAL HISTORY:  Social History   Tobacco Use   Smoking status: Former    Packs/day: 1.00    Years: 15.00    Total pack years: 15.00    Types: Cigarettes    Quit date: 10/31/1998    Years since quitting: 23.8   Smokeless tobacco: Current    Types: Chew  Vaping Use   Vaping Use: Never used  Substance Use Topics   Alcohol use: Not Currently    Comment: rare   Drug use: Never    ALLERGIES: No Known Allergies  MEDICATIONS:  Current Outpatient Medications  Medication Sig Dispense Refill   acetaminophen (TYLENOL) 650 MG CR tablet Take 1,300 mg by mouth in the morning, at noon, in the evening, and at  bedtime.     acyclovir (ZOVIRAX) 400 MG tablet Take 1 tablet (400 mg total) by mouth 2 (two) times daily. 180 tablet 0   aspirin 81 MG EC tablet Take by mouth.     calcium carbonate (OS-CAL - DOSED IN MG OF ELEMENTAL CALCIUM) 1250 (500 Ca) MG tablet Take 1 tablet (500 mg of elemental calcium total) by mouth 2 (two) times daily with a meal. 60 tablet 0   celecoxib (CELEBREX) 100 MG capsule TAKE 1 CAPSULE BY MOUTH 2 TIMES DAILY WITH FOOD AS NEEDED FOR PAIN & INFLAMMATION 180 capsule 0   dronabinol (MARINOL) 5 MG capsule TAKE 1 CAPSULE BY MOUTH TWICE DAILY BEFORE A MEAL (Patient not taking: Reported on 04/13/2022) 60 capsule 2   levETIRAcetam (KEPPRA) 500 MG tablet TAKE 1 TABLET BY MOUTH 2 TIMES A DAY 60 tablet 3   loperamide (IMODIUM) 2 MG capsule Take 1 capsule (2 mg total) by mouth as needed for diarrhea or loose stools. (Patient not taking: Reported on 04/13/2022) 30 capsule 0   metoprolol succinate (TOPROL-XL) 25 MG 24 hr tablet Take 25 mg by mouth daily.     ondansetron (ZOFRAN) 8 MG tablet TAKE 1 TABLET BY MOUTH EVERY 8 HOURS AS NEEDED NAUSEA AND VOMITING 180 tablet 1   pantoprazole (PROTONIX) 40 MG tablet TAKE 1 TABLET BY MOUTH 2 TIMES A  DAY BEFORE MEALS FOR ACID REFLUX 180 tablet 0   polyethylene glycol powder (GLYCOLAX/MIRALAX) 17 GM/SCOOP powder Take by mouth daily.     pregabalin (LYRICA) 200 MG capsule TAKE 1 CAPSULE BY MOUTH 2 TIMES DAILY 180 capsule 0   tamsulosin (FLOMAX) 0.4 MG CAPS capsule TAKE 1 CAPSULE BY MOUTH AT BEDTIME 90 capsule 0   traMADol (ULTRAM) 50 MG tablet TAKE TWO TABLETS BY MOUTH EVERY 6 HOURS AS NEEDED FOR PAIN 240 tablet 0   No current facility-administered medications for this encounter.    REVIEW OF SYSTEMS:  A 10+ POINT REVIEW OF SYSTEMS WAS OBTAINED including neurology, dermatology, psychiatry, cardiac, respiratory, lymph, extremities, GI, GU, musculoskeletal, constitutional, reproductive, HEENT. ***   PHYSICAL EXAM:  vitals were not taken for this visit.    General: Alert and oriented, in no acute distress HEENT: Head is normocephalic. Extraocular movements are intact. Oropharynx is clear. Neck: Neck is supple, no palpable cervical or supraclavicular lymphadenopathy. Heart: Regular in rate and rhythm with no murmurs, rubs, or gallops. Chest: Clear to auscultation bilaterally, with no rhonchi, wheezes, or rales. Abdomen: Soft, nontender, nondistended, with no rigidity or guarding. Extremities: No cyanosis or edema. Lymphatics: see Neck Exam Skin: No concerning lesions. Musculoskeletal: symmetric strength and muscle tone throughout. Neurologic: Cranial nerves II through XII are grossly intact. No obvious focalities. Speech is fluent. Coordination is intact. Psychiatric: Judgment and insight are intact. Affect is appropriate. ***  ECOG = ***  0 - Asymptomatic (Fully active, able to carry on all predisease activities without restriction)  1 - Symptomatic but completely ambulatory (Restricted in physically strenuous activity but ambulatory and able to carry out work of a light or sedentary nature. For example, light housework, office work)  2 - Symptomatic, <50% in bed during the day (Ambulatory and capable of all self care but unable to carry out any work activities. Up and about more than 50% of waking hours)  3 - Symptomatic, >50% in bed, but not bedbound (Capable of only limited self-care, confined to bed or chair 50% or more of waking hours)  4 - Bedbound (Completely disabled. Cannot carry on any self-care. Totally confined to bed or chair)  5 - Death   Eustace Pen MM, Creech RH, Tormey DC, et al. [phone]). "Toxicity and response criteria of the [facility]". [place] Oncol. 5 (6): 649-55  LABORATORY DATA:  Lab Results  Component Value Date   WBC 9.1 08/03/2022   HGB 13.5 08/03/2022   HCT 41.1 08/03/2022   MCV 97.9 08/03/2022   PLT 160 08/03/2022   NEUTROABS 6.7 08/03/2022   Lab Results  Component Value Date    NA 140 08/03/2022   K 4.6 08/03/2022   CL 106 08/03/2022   CO2 27 08/03/2022   GLUCOSE 72 08/03/2022   BUN 20 08/03/2022   CREATININE 1.09 08/03/2022   CALCIUM 9.1 08/03/2022      RADIOGRAPHY: NM PET Image Restage (PS) Whole Body (F-18 FDG)  Result Date: 08/18/2022 CLINICAL DATA:  Subsequent treatment strategy for multiple myeloma. Last chemotherapy administered earlier this month. EXAM: NUCLEAR MEDICINE PET WHOLE BODY TECHNIQUE: 10.3 mCi F-18 FDG was injected intravenously. Full-ring PET imaging was performed from the head to foot after the radiotracer. CT data was obtained and used for attenuation correction and anatomic localization. Fasting blood glucose: 85 mg/dl COMPARISON:  PET-CT 10/16/2020. MRI of the lumbar spine and pelvis 01/14/2022. FINDINGS: Mediastinal blood pool activity: SUV max 2.1 HEAD/ NECK: No hypermetabolic cervical lymph nodes are  identified.Fairly symmetric activity within the lymphoid tissue of Waldeyer's ring is within physiologic limits.No suspicious activity identified within the pharyngeal mucosal space. Incidental CT findings: Mild bilateral carotid atherosclerosis. CHEST: There are no hypermetabolic mediastinal, hilar or axillary lymph nodes. The previously demonstrated hypermetabolic activity in the superior left hemithorax attributed to radiation therapy has resolved. There is no hypermetabolic pulmonary activity. Incidental CT findings: Atherosclerosis of the aorta, great vessels and coronary arteries. Linear scarring in the left upper lobe and superior segment of the left lower lobe, improved from previous CT and attributed to prior radiation therapy. New part solid 8 mm left lower lobe nodule on image 49/7 without hypermetabolic activity. No other suspicious nodularity. There is mild underlying emphysema. ABDOMEN/PELVIS: There is no hypermetabolic activity within the liver, adrenal glands, spleen or pancreas. There is no hypermetabolic nodal activity in the  abdomen or pelvis. Incidental CT findings: Aortic and branch vessel atherosclerosis. Mild distal colonic diverticulosis. SKELETON: New intense hypermetabolic activity posteriorly in the T12 vertebral body (SUV max 57.5) compatible with recurrent myelomatous involvement. Additional focal hypermetabolic activity anteriorly in the left iliac bone (SUV max 51.1). In the mid left upper arm, there is focal hypermetabolic activity which does not localize well due to partial exclusion of this area from the CT images, but probably localizes to the mid humerus (SUV max 12.9). No other hypermetabolic lesions are identified Incidental CT findings: Grossly stable deformity of the left scapula consistent with treated myeloma. No recurrent hypermetabolic activity in this area. Scattered mixed lytic and sclerotic lesions in the spine, right iliac crest and proximal left femur without hypermetabolic activity. Previous left femoral ORIF. EXTREMITIES: As above, focal hypermetabolic activity in the left upper arm, likely localizing to the mid humerus. No other evidence of metabolically active myeloma within the extremities. Incidental CT findings: No soft tissue masses are identified in the extremities. There are scattered vascular calcifications. IMPRESSION: 1. New hypermetabolic lesions within the T12 vertebral body, anterior left iliac bone and left mid humerus consistent with recurrent myelomatous involvement. 2. No other hypermetabolic osseous lesions are identified. Scattered treated osseous lesions are unchanged without recurrent metabolic activity. 3. Evolving post radiation changes in the left upper lobe and superior segment of the left lower lobe with resolution of previously demonstrated hypermetabolic activity in this area. 4. New 8 mm part solid left lower lobe pulmonary nodule without hypermetabolic activity, possibly inflammatory. Attention on follow-up chest CT in 6 months recommended. 5.  Aortic Atherosclerosis  (ICD10-I70.0). Electronically Signed   By: Richardean Sale M.D.   On: 08/18/2022 09:46      IMPRESSION: Kappa light chain myeloma with extensive bone disease and pathologic fracture of the left scapula and left femur    Recent PET scan with new hypermetabolic lesions within the T12 vertebral body, anterior left iliac bone, and left mid humerus consistent with recurrent myelomatous involvement  ***  Today, I talked to the patient and family about the findings and work-up thus far.  We discussed the natural history of *** and general treatment, highlighting the role of radiotherapy in the management.  We discussed the available radiation techniques, and focused on the details of logistics and delivery.  We reviewed the anticipated acute and late sequelae associated with radiation in this setting.  The patient was encouraged to ask questions that I answered to the best of my ability. *** A patient consent form was discussed and signed.  We retained a copy for our records.  The patient would like to proceed with  radiation and will be scheduled for CT simulation.  PLAN: ***    *** minutes of total time was spent for this patient encounter, including preparation, face-to-face counseling with the patient and coordination of care, physical exam, and documentation of the encounter.   ------------------------------------------------  Blair Promise, PhD, MD  This document serves as a record of services personally performed by Gery Pray, MD. It was created on his behalf by Roney Mans, a trained medical scribe. The creation of this record is based on the scribe's personal observations and the provider's statements to them. This document has been checked and approved by the attending provider.

## 2022-09-05 ENCOUNTER — Encounter: Payer: Self-pay | Admitting: Radiation Oncology

## 2022-09-05 ENCOUNTER — Ambulatory Visit
Admission: RE | Admit: 2022-09-05 | Discharge: 2022-09-05 | Disposition: A | Discharge status: Home / Self Care | Payer: Medicare Other | Source: Ambulatory Visit | Attending: Radiation Oncology | Admitting: Radiation Oncology

## 2022-09-05 VITALS — BP 147/69 | HR 59 | Temp 97.3°F | Resp 18 | Ht 68.0 in | Wt 200.4 lb

## 2022-09-05 DIAGNOSIS — C419 Malignant neoplasm of bone and articular cartilage, unspecified: Secondary | ICD-10-CM

## 2022-09-05 DIAGNOSIS — I10 Essential (primary) hypertension: Secondary | ICD-10-CM | POA: Diagnosis not present

## 2022-09-05 DIAGNOSIS — I2510 Atherosclerotic heart disease of native coronary artery without angina pectoris: Secondary | ICD-10-CM | POA: Diagnosis not present

## 2022-09-05 DIAGNOSIS — I252 Old myocardial infarction: Secondary | ICD-10-CM | POA: Insufficient documentation

## 2022-09-05 DIAGNOSIS — J439 Emphysema, unspecified: Secondary | ICD-10-CM | POA: Diagnosis not present

## 2022-09-05 DIAGNOSIS — Z79624 Long term (current) use of inhibitors of nucleotide synthesis: Secondary | ICD-10-CM | POA: Insufficient documentation

## 2022-09-05 DIAGNOSIS — S42102A Fracture of unspecified part of scapula, left shoulder, initial encounter for closed fracture: Secondary | ICD-10-CM | POA: Insufficient documentation

## 2022-09-05 DIAGNOSIS — Z79899 Other long term (current) drug therapy: Secondary | ICD-10-CM | POA: Diagnosis not present

## 2022-09-05 DIAGNOSIS — Z87891 Personal history of nicotine dependence: Secondary | ICD-10-CM | POA: Insufficient documentation

## 2022-09-05 DIAGNOSIS — C9000 Multiple myeloma not having achieved remission: Secondary | ICD-10-CM

## 2022-09-05 DIAGNOSIS — Z923 Personal history of irradiation: Secondary | ICD-10-CM | POA: Insufficient documentation

## 2022-09-05 DIAGNOSIS — R911 Solitary pulmonary nodule: Secondary | ICD-10-CM | POA: Diagnosis not present

## 2022-09-05 DIAGNOSIS — K5730 Diverticulosis of large intestine without perforation or abscess without bleeding: Secondary | ICD-10-CM | POA: Diagnosis not present

## 2022-09-05 DIAGNOSIS — Z8042 Family history of malignant neoplasm of prostate: Secondary | ICD-10-CM | POA: Insufficient documentation

## 2022-09-05 DIAGNOSIS — Z791 Long term (current) use of non-steroidal anti-inflammatories (NSAID): Secondary | ICD-10-CM | POA: Insufficient documentation

## 2022-09-05 DIAGNOSIS — E785 Hyperlipidemia, unspecified: Secondary | ICD-10-CM | POA: Insufficient documentation

## 2022-09-06 ENCOUNTER — Other Ambulatory Visit: Payer: Self-pay

## 2022-09-08 ENCOUNTER — Inpatient Hospital Stay: Payer: Medicare Other | Attending: Hematology & Oncology

## 2022-09-08 ENCOUNTER — Inpatient Hospital Stay (HOSPITAL_BASED_OUTPATIENT_CLINIC_OR_DEPARTMENT_OTHER): Payer: Medicare Other | Admitting: Hematology & Oncology

## 2022-09-08 ENCOUNTER — Inpatient Hospital Stay: Payer: Medicare Other

## 2022-09-08 ENCOUNTER — Encounter: Payer: Self-pay | Admitting: Hematology & Oncology

## 2022-09-08 ENCOUNTER — Other Ambulatory Visit: Payer: Self-pay

## 2022-09-08 VITALS — BP 135/63 | HR 54 | Temp 97.0°F | Resp 19

## 2022-09-08 VITALS — BP 142/63 | HR 62 | Temp 97.7°F | Resp 20 | Ht 68.0 in | Wt 204.1 lb

## 2022-09-08 DIAGNOSIS — Z5112 Encounter for antineoplastic immunotherapy: Secondary | ICD-10-CM | POA: Insufficient documentation

## 2022-09-08 DIAGNOSIS — C9000 Multiple myeloma not having achieved remission: Secondary | ICD-10-CM | POA: Insufficient documentation

## 2022-09-08 LAB — CBC WITH DIFFERENTIAL (CANCER CENTER ONLY)
Abs Immature Granulocytes: 0.07 10*3/uL (ref 0.00–0.07)
Basophils Absolute: 0 10*3/uL (ref 0.0–0.1)
Basophils Relative: 0 %
Eosinophils Absolute: 0.1 10*3/uL (ref 0.0–0.5)
Eosinophils Relative: 2 %
HCT: 39.4 % (ref 39.0–52.0)
Hemoglobin: 13 g/dL (ref 13.0–17.0)
Immature Granulocytes: 1 %
Lymphocytes Relative: 21 %
Lymphs Abs: 1.1 10*3/uL (ref 0.7–4.0)
MCH: 32.6 pg (ref 26.0–34.0)
MCHC: 33 g/dL (ref 30.0–36.0)
MCV: 98.7 fL (ref 80.0–100.0)
Monocytes Absolute: 0.9 10*3/uL (ref 0.1–1.0)
Monocytes Relative: 16 %
Neutro Abs: 3.1 10*3/uL (ref 1.7–7.7)
Neutrophils Relative %: 60 %
Platelet Count: 189 10*3/uL (ref 150–400)
RBC: 3.99 MIL/uL — ABNORMAL LOW (ref 4.22–5.81)
RDW: 14 % (ref 11.5–15.5)
WBC Count: 5.3 10*3/uL (ref 4.0–10.5)
nRBC: 0 % (ref 0.0–0.2)

## 2022-09-08 LAB — CMP (CANCER CENTER ONLY)
ALT: 21 U/L (ref 0–44)
AST: 13 U/L — ABNORMAL LOW (ref 15–41)
Albumin: 3.9 g/dL (ref 3.5–5.0)
Alkaline Phosphatase: 71 U/L (ref 38–126)
Anion gap: 6 (ref 5–15)
BUN: 16 mg/dL (ref 8–23)
CO2: 28 mmol/L (ref 22–32)
Calcium: 8.9 mg/dL (ref 8.9–10.3)
Chloride: 107 mmol/L (ref 98–111)
Creatinine: 1.03 mg/dL (ref 0.61–1.24)
GFR, Estimated: >60 mL/min (ref >60)
Glucose, Bld: 74 mg/dL (ref 70–99)
Potassium: 4.3 mmol/L (ref 3.5–5.1)
Sodium: 141 mmol/L (ref 135–145)
Total Bilirubin: 0.3 mg/dL (ref 0.3–1.2)
Total Protein: 5.6 g/dL — ABNORMAL LOW (ref 6.5–8.1)

## 2022-09-08 LAB — LACTATE DEHYDROGENASE: LDH: 153 U/L (ref 98–192)

## 2022-09-08 MED ORDER — ACETAMINOPHEN 325 MG PO TABS: 650.0000 mg | ORAL_TABLET | Freq: Once | ORAL | Status: DC

## 2022-09-08 MED ORDER — DIPHENHYDRAMINE HCL 25 MG PO CAPS: 50.0000 mg | ORAL_CAPSULE | Freq: Once | ORAL | Status: DC

## 2022-09-08 MED ORDER — FAMOTIDINE 20 MG PO TABS: 40.0000 mg | ORAL_TABLET | Freq: Once | ORAL | Status: DC

## 2022-09-08 MED ORDER — DARATUMUMAB-HYALURONIDASE-FIHJ 1800-30000 MG-UT/15ML ~~LOC~~ SOLN
1800.0000 mg | Freq: Once | SUBCUTANEOUS | Status: AC
  Administered 2022-09-08: 1800 mg via SUBCUTANEOUS
  Filled 2022-09-08: qty 15

## 2022-09-08 NOTE — Patient Instructions (Signed)
[facility]  Discharge Instructions: Thank you for choosing [facility] to provide your oncology and hematology care.   If you have a lab appointment with the [facility], please go directly to the [facility] and check in at the registration area.  Wear comfortable clothing and clothing appropriate for easy access to any Portacath or PICC line.   We strive to give you quality time with your provider. You may need to reschedule your appointment if you arrive late (15 or more minutes).  Arriving late affects you and other patients whose appointments are after yours.  Also, if you miss three or more appointments without notifying the office, you may be dismissed from the clinic at the provider's discretion.      For prescription refill requests, have your pharmacy contact our office and allow 72 hours for refills to be completed.    Today you received the following chemotherapy and/or immunotherapy agents Faspro    To help prevent nausea and vomiting after your treatment, we encourage you to take your nausea medication as directed.  BELOW ARE SYMPTOMS THAT SHOULD BE REPORTED IMMEDIATELY: *FEVER GREATER THAN 100.4 F (38 C) OR HIGHER *CHILLS OR SWEATING *NAUSEA AND VOMITING THAT IS NOT CONTROLLED WITH YOUR NAUSEA MEDICATION *UNUSUAL SHORTNESS OF BREATH *UNUSUAL BRUISING OR BLEEDING *URINARY PROBLEMS (pain or burning when urinating, or frequent urination) *BOWEL PROBLEMS (unusual diarrhea, constipation, pain near the anus) TENDERNESS IN MOUTH AND THROAT WITH OR WITHOUT PRESENCE OF ULCERS (sore throat, sores in mouth, or a toothache) UNUSUAL RASH, SWELLING OR PAIN  UNUSUAL VAGINAL DISCHARGE OR ITCHING   Items with * indicate a potential emergency and should be followed up as soon as possible or go to the Emergency Department if any problems should occur.  Please show the CHEMOTHERAPY ALERT CARD or IMMUNOTHERAPY ALERT CARD at check-in to the  Emergency Department and triage nurse. Should you have questions after your visit or need to cancel or reschedule your appointment, please contact [facility]  [phone] and follow the prompts.  Office hours are 8:00 a.m. to 4:30 p.m. Monday - Friday. Please note that voicemails left after 4:00 p.m. may not be returned until the following business day.  We are closed weekends and major holidays. You have access to a nurse at all times for urgent questions. Please call the main number to the clinic [phone] and follow the prompts.  For any non-urgent questions, you may also contact your provider using MyChart. We now offer e-Visits for anyone 21 and older to request care online for non-urgent symptoms. For details visit [email or web address].   Also download the MyChart app! Go to the app store, search "MyChart", open the app, select [facility], and log in with your MyChart username and password.  Masks are optional in the cancer centers. If you would like for your care team to wear a mask while they are taking care of you, please let them know. You may have one support person who is at least 71 years old accompany you for your appointments.

## 2022-09-08 NOTE — Progress Notes (Signed)
Hematology and Oncology Follow Up Visit  Bruce Smith [id number] 27-Nov-1950 71 y.o. 09/08/2022   Principle Diagnosis:  Kappa light chain myeloma-extensive bone disease-pathologic fracture of left scapula and left femur - t(11:14), 13q-, 1p-,1q-  Past Therapy: Status post surgical repair of left femur-07/14/2020 XRT to left shoulder and left hip Xgeva 120 mg subcu every 3 months-next dose in December 2022  Faspro/Velcade/Revlimid/Decadron -- s/p cycle #2 -- start on       08/06/2020  ( Revlimid is 14 on /14 off)    Current Therapy:        ASCT -- [facility] on -01/22/2021 Faspro - q month -- maintenance -- start on 05/13/2021 Xgeva 120 mg subcu every 3 months-next dose in 10/2022   Interim History:  Bruce Smith is here today with his wife for follow-up.  Unfortunately, we may have a problem with effective recurrence of his disease.  When we last saw him, there was some issues with his left hip.  He had been seen by orthopedic surgery.  We went ahead and did a PET scan on him.  The PET scan was done on 08/18/2022.  Surprisingly, the PET scan did show that there is some areas of active disease.  He had a new lesion at T12.  There is areas in the left iliac bone and left mid humerus.  Surprising, none of his myeloma studies have looked unusual.  His last Kappa light chain was 0.5 mg/dL.  We did do a 24-hour urine on him back on 08/08/2022.  This did not show any Kappa light chain excretion and no Bence-Jones protein.  He has seen Dr. Sondra Come of Radiation Oncology.  I think we will start radiation in a week.  He is due for a bone marrow biopsy.  We will see if this helps his.  I realize that his initial myeloma did have some adverse cytogenetics.  This could certainly be an indicator for [place].  He and his wife did have a wonderful time in [place] and in [place], [place].  He did bag a deer out in [place].   He has had no problems with appetite.  He has had no change in bowel or bladder habits.   He has had no cough or shortness of breath.  Currently, I would say that his performance status is ECOG 1.    Medications:  Allergies as of 09/08/2022   Not on File      Medication List        Accurate as of September 08, 2022  9:25 AM. If you have any questions, ask your nurse or doctor.          STOP taking these medications    dronabinol 5 MG capsule Commonly known as: MARINOL Stopped by: Volanda Napoleon, MD       TAKE these medications    acetaminophen 650 MG CR tablet Commonly known as: TYLENOL Take 1,300 mg by mouth in the morning, at noon, in the evening, and at bedtime.   acyclovir 400 MG tablet Commonly known as: ZOVIRAX Take 1 tablet (400 mg total) by mouth 2 (two) times daily.   aspirin EC 81 MG tablet Take by mouth daily.   calcium carbonate 1250 (500 Ca) MG tablet Commonly known as: OS-CAL - dosed in mg of elemental calcium Take 1 tablet (500 mg of elemental calcium total) by mouth 2 (two) times daily with a meal.   celecoxib 100 MG capsule Commonly known as: CELEBREX TAKE 1 CAPSULE BY MOUTH  2 TIMES DAILY WITH FOOD AS NEEDED FOR PAIN & INFLAMMATION   levETIRAcetam 500 MG tablet Commonly known as: KEPPRA TAKE 1 TABLET BY MOUTH 2 TIMES A DAY   loperamide 2 MG capsule Commonly known as: IMODIUM Take 1 capsule (2 mg total) by mouth as needed for diarrhea or loose stools.   metoprolol succinate 25 MG 24 hr tablet Commonly known as: TOPROL-XL Take 25 mg by mouth daily.   ondansetron 8 MG tablet Commonly known as: ZOFRAN TAKE 1 TABLET BY MOUTH EVERY 8 HOURS AS NEEDED NAUSEA AND VOMITING   pantoprazole 40 MG tablet Commonly known as: PROTONIX TAKE 1 TABLET BY MOUTH 2 TIMES A DAY BEFORE MEALS FOR ACID REFLUX   polyethylene glycol powder 17 GM/SCOOP powder Commonly known as: GLYCOLAX/MIRALAX Take by mouth daily.   pregabalin 200 MG capsule Commonly known as: LYRICA TAKE 1 CAPSULE BY MOUTH 2 TIMES DAILY   tamsulosin 0.4 MG Caps  capsule Commonly known as: FLOMAX TAKE 1 CAPSULE BY MOUTH AT BEDTIME   traMADol 50 MG tablet Commonly known as: ULTRAM TAKE TWO TABLETS BY MOUTH EVERY 6 HOURS AS NEEDED FOR PAIN        Allergies: Not on File  Past Medical History, Surgical history, Social history, and Family History were reviewed and updated.  Review of Systems: Review of Systems  Constitutional: Negative.   HENT: Negative.    Eyes: Negative.   Respiratory: Negative.    Cardiovascular: Negative.   Gastrointestinal: Negative.   Genitourinary: Negative.   Musculoskeletal:  Positive for joint pain.  Skin: Negative.   Neurological: Negative.   Endo/Heme/Allergies: Negative.   Psychiatric/Behavioral: Negative.       Physical Exam:  height is _0  (1.727 m) and weight is 204 lb 1.9 oz (92.6 kg). His oral temperature is 97.7 F (36.5 C). His blood pressure is 142/63 (abnormal) and his pulse is 62. His respiration is 20 and oxygen saturation is 95%.   Wt Readings from Last 3 Encounters:  09/08/22 204 lb 1.9 oz (92.6 kg)  09/05/22 200 lb 6 oz (90.9 kg)  08/03/22 197 lb (89.4 kg)    Physical Exam Vitals reviewed.  HENT:     Head: Normocephalic and atraumatic.  Eyes:     Pupils: Pupils are equal, round, and reactive to light.  Cardiovascular:     Rate and Rhythm: Normal rate and regular rhythm.     Heart sounds: Normal heart sounds.  Pulmonary:     Effort: Pulmonary effort is normal.     Breath sounds: Normal breath sounds.  Abdominal:     General: Bowel sounds are normal.     Palpations: Abdomen is soft.  Musculoskeletal:        General: No tenderness or deformity. Normal range of motion.     Cervical back: Normal range of motion.     Comments: There is pain to palpation in the left hip area.  This is in the lateral hip area.  He has some decreased range of motion.  Lymphadenopathy:     Cervical: No cervical adenopathy.  Skin:    General: Skin is warm and dry.     Findings: No erythema or  rash.  Neurological:     Mental Status: He is alert and oriented to person, place, and time.  Psychiatric:        Behavior: Behavior normal.        Thought Content: Thought content normal.        Judgment: Judgment normal.  Lab Results  Component Value Date   WBC 5.3 09/08/2022   HGB 13.0 09/08/2022   HCT 39.4 09/08/2022   MCV 98.7 09/08/2022   PLT 189 09/08/2022   Lab Results  Component Value Date   FERRITIN 938 (H) 02/23/2021   IRON 67 02/23/2021   TIBC 156 (L) 02/23/2021   UIBC 90 (L) 02/23/2021   IRONPCTSAT 43 02/23/2021   Lab Results  Component Value Date   RBC 3.99 (L) 09/08/2022   Lab Results  Component Value Date   KPAFRELGTCHN 5.4 08/03/2022   LAMBDASER 3.3 (L) 08/03/2022   KAPLAMBRATIO >3.65 08/08/2022   Lab Results  Component Value Date   IGGSERUM 331 (L) 08/03/2022   IGA 15 (L) 08/03/2022   IGMSERUM 7 (L) 08/03/2022   Lab Results  Component Value Date   TOTALPROTELP 5.7 (L) 08/03/2022   ALBUMINELP 3.6 08/03/2022   A1GS 0.2 08/03/2022   A2GS 0.8 08/03/2022   BETS 0.7 08/03/2022   GAMS 0.4 08/03/2022   MSPIKE Not Observed 08/03/2022     Chemistry      Component Value Date/Time   NA 141 09/08/2022 0844   K 4.3 09/08/2022 0844   CL 107 09/08/2022 0844   CO2 28 09/08/2022 0844   BUN 16 09/08/2022 0844   CREATININE 1.03 09/08/2022 0844      Component Value Date/Time   CALCIUM 8.9 09/08/2022 0844   ALKPHOS 71 09/08/2022 0844   AST 13 (L) 09/08/2022 0844   ALT 21 09/08/2022 0844   BILITOT 0.3 09/08/2022 0844       Impression and Plan: Bruce Smith is a very pleasant 71 yo gentleman with 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   Again, we certainly be dealing with recurrent disease.  It is possible that he may have non-secretory myeloma given the fact that his labs all look fine and the 24-hour urine looked fine.  I think the bone marrow will be important for [place].  He is on Faspro.  I  suppose we could always add something to the [place].  However, ultimately, he may need to have a little bit more intensive therapy.  I think he would be a candidate for CAR-T therapy.  One of the new bidirectional agents could also be considered.  We may have to get him back to [facility] for a evaluation.  For right now, we will give the Faspro.  We will have him come back in 1 month.  At that time, we will have all the results back from his bone marrow test.  Hillery Aldo, MD 11/9/20239:25 AM

## 2022-09-08 NOTE — Progress Notes (Signed)
Patient refused to wait 1 hour post injection. Released stable and ASX.

## 2022-09-09 LAB — KAPPA/LAMBDA LIGHT CHAINS
Kappa free light chain: 4.9 mg/L (ref 3.3–19.4)
Kappa, lambda light chain ratio: 2.04 — ABNORMAL HIGH (ref 0.26–1.65)
Lambda free light chains: 2.4 mg/L — ABNORMAL LOW (ref 5.7–26.3)

## 2022-09-10 LAB — IGG, IGA, IGM
IgA: 12 mg/dL — ABNORMAL LOW (ref 61–437)
IgG (Immunoglobin G), Serum: 254 mg/dL — ABNORMAL LOW (ref 603–1613)
IgM (Immunoglobulin M), Srm: 7 mg/dL — ABNORMAL LOW (ref 15–143)

## 2022-09-12 ENCOUNTER — Ambulatory Visit
Admission: RE | Admit: 2022-09-12 | Discharge: 2022-09-12 | Disposition: A | Discharge status: Home / Self Care | Payer: Medicare Other | Source: Ambulatory Visit | Attending: Radiation Oncology | Admitting: Radiation Oncology

## 2022-09-12 ENCOUNTER — Other Ambulatory Visit: Payer: Self-pay

## 2022-09-12 DIAGNOSIS — C419 Malignant neoplasm of bone and articular cartilage, unspecified: Secondary | ICD-10-CM | POA: Diagnosis not present

## 2022-09-12 DIAGNOSIS — C9000 Multiple myeloma not having achieved remission: Secondary | ICD-10-CM | POA: Diagnosis not present

## 2022-09-12 DIAGNOSIS — Z510 Encounter for antineoplastic radiation therapy: Secondary | ICD-10-CM | POA: Insufficient documentation

## 2022-09-12 LAB — PROTEIN ELECTROPHORESIS, SERUM, WITH REFLEX
A/G Ratio: 2.2 — ABNORMAL HIGH (ref 0.7–1.7)
Albumin ELP: 3.5 g/dL (ref 2.9–4.4)
Alpha-1-Globulin: 0.2 g/dL (ref 0.0–0.4)
Alpha-2-Globulin: 0.7 g/dL (ref 0.4–1.0)
Beta Globulin: 0.6 g/dL — ABNORMAL LOW (ref 0.7–1.3)
Gamma Globulin: 0.2 g/dL — ABNORMAL LOW (ref 0.4–1.8)
Globulin, Total: 1.6 g/dL — ABNORMAL LOW (ref 2.2–3.9)
Total Protein ELP: 5.1 g/dL — ABNORMAL LOW (ref 6.0–8.5)

## 2022-09-13 ENCOUNTER — Other Ambulatory Visit: Payer: Self-pay

## 2022-09-15 DIAGNOSIS — Z510 Encounter for antineoplastic radiation therapy: Secondary | ICD-10-CM | POA: Diagnosis not present

## 2022-09-16 ENCOUNTER — Other Ambulatory Visit: Payer: Self-pay | Admitting: Internal Medicine

## 2022-09-16 DIAGNOSIS — C9000 Multiple myeloma not having achieved remission: Secondary | ICD-10-CM

## 2022-09-16 NOTE — H&P (Signed)
Chief Complaint: Patient was seen in consultation today for Kappa light chain myeloma   at the request of Burney Gauze  Referring Physician(s): Burney Gauze  Supervising Physician: Michaelle Birks  Patient Status: [place]  History of Present Illness: Bruce Smith is a 71 y.o. male with PMH significant for multiple myeloma. Pt is followed by oncology for the same. Pt was first referred to oncology after presenting to ED with left shoulder/scapular pain and was found to have lytic lesions within left scapula, left lateral rib, right scapula, bilateral ribs as well as left scapular fracture. He underwent left scapular lytic mass biopsy with IR 07/09/20 that was positive for Kappa light chain myeloma. He underwent bone marrow biopsy 07/28/20 for further workup that resulted Hypercellular marrow involved by plasma cell neoplasm (30-40%) and B-cell lymphocytosis. Pt received chemoradiation treatment and was continued to be followed by oncology. Pt recently c/o left hip pain. PET scan was performed 08/18/22 and found new numerous hypermetabolic lesions and new left lower pulmonary nodule. Pt has once again been referred to IR for bone marrow biopsy to determine a recurrence of myeloma.   Pt denies fever, chills, CP, SOB, N/V.  He endorses L leg weakness for which he uses a cane to ambulate.  He is NPO per order.   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 ([place]) 06/29/2020   Multiple myeloma ([place]) 07/21/2020   Myocardial infarction [facility])     Past Surgical History:  Procedure Laterality Date   ANTERIOR CERVICAL DECOMP/DISCECTOMY FUSION N/A 06/17/2020   Procedure: ANTERIOR CERVICAL DECOMPRESSION/DISCECTOMY FUSION, INTERBODY PROSTHESIS, PLATE/SCREWS CERVICAL FIVE- CERVICAL  SIX;  Surgeon: Newman Pies, MD;  Location: [place];  Service: Neurosurgery;  Laterality: N/A;  ANTERIOR CERVICAL DECOMPRESSION/DISCECTOMY FUSION, INTERBODY PROSTHESIS, PLATE/SCREWS CERVICAL FIVE- CERVICAL SIX   BIOPSY  02/24/2021   Procedure: BIOPSY;  Surgeon: Otis Brace, MD;  Location: [facility];  Service: Gastroenterology;;   CARDIAC CATHETERIZATION     ESOPHAGOGASTRODUODENOSCOPY N/A 02/24/2021   Procedure: ESOPHAGOGASTRODUODENOSCOPY (EGD);  Surgeon: Otis Brace, MD;  Location: [facility];  Service: Gastroenterology;  Laterality: N/A;   FEMUR IM NAIL Left 07/14/2020   Procedure: OPEN REDUCTION INTERNAL FIXATION (ORIF LEFT FEMORAL NAIL FRACTURE;  Surgeon: Marchia Bond, MD;  Location: [facility];  Service: Orthopedics;  Laterality: Left;    Allergies: Patient has no allergy information on record.  Medications: Prior to Admission medications   Medication Sig Start Date End Date Taking? Authorizing Provider  acetaminophen (TYLENOL) 650 MG CR tablet Take 1,300 mg by mouth in the morning, at noon, in the evening, and at bedtime.    [provider]  acyclovir (ZOVIRAX) 400 MG tablet Take 1 tablet (400 mg total) by mouth 2 (two) times daily. 09/16/21   Volanda Napoleon, MD  aspirin 81 MG EC tablet Take by mouth daily. 12/20/21 12/20/22  [provider]  calcium carbonate (OS-CAL - DOSED IN MG OF ELEMENTAL CALCIUM) 1250 (500 Ca) MG tablet Take 1 tablet (500 mg of elemental calcium total) by mouth 2 (two) times daily with a meal. 03/17/21   Love, Ivan Anchors, PA-C  celecoxib (CELEBREX) 100 MG capsule TAKE 1 CAPSULE BY MOUTH 2 TIMES DAILY WITH FOOD AS NEEDED FOR PAIN & INFLAMMATION 07/11/22   Volanda Napoleon, MD  levETIRAcetam (KEPPRA) 500 MG tablet TAKE 1 TABLET BY MOUTH 2 TIMES A DAY 07/11/22   Volanda Napoleon, MD  loperamide (IMODIUM) 2 MG capsule Take 1 capsule (2 mg total) by mouth as needed for diarrhea or loose stools. Patient not taking: Reported on 09/08/2022 03/17/21    Love, Ivan Anchors, PA-C  metoprolol succinate (TOPROL-XL) 25 MG 24 hr tablet Take 25 mg by mouth daily. 03/17/22   [provider]  ondansetron (ZOFRAN) 8 MG tablet TAKE 1 TABLET BY MOUTH EVERY 8 HOURS AS NEEDED NAUSEA AND VOMITING Patient not taking: Reported on 09/08/2022 09/16/21   Volanda Napoleon, MD  pantoprazole (PROTONIX) 40 MG tablet TAKE 1 TABLET BY MOUTH 2 TIMES A DAY BEFORE MEALS FOR ACID REFLUX 07/11/22   Volanda Napoleon, MD  polyethylene glycol powder (GLYCOLAX/MIRALAX) 17 GM/SCOOP powder Take by mouth daily.    [provider]  pregabalin (LYRICA) 200 MG capsule TAKE 1 CAPSULE BY MOUTH 2 TIMES DAILY 08/24/22   Volanda Napoleon, MD  tamsulosin (FLOMAX) 0.4 MG CAPS capsule TAKE 1 CAPSULE BY MOUTH AT BEDTIME 06/27/22   Ennever, Rudell Cobb, MD  traMADol (ULTRAM) 50 MG tablet TAKE TWO TABLETS BY MOUTH EVERY 6 HOURS AS NEEDED FOR PAIN 09/02/22   Volanda Napoleon, MD     Family History  Problem Relation Age of Onset   Stroke Mother    Prostate cancer Paternal Grandfather     Social History   Socioeconomic History   Marital status: Married    Spouse name: Not on file   Number of children: Not on file   Years of education: Not on file   Highest education level: Not on file  Occupational History   Not on file  Tobacco Use   Smoking status: Former    Packs/day: 1.00    Years: 15.00    Total pack years: 15.00    Types: Cigarettes    Quit date: 10/31/1998    Years since quitting: 23.8   Smokeless tobacco: Current    Types: Chew  Vaping Use   Vaping Use: Never used  Substance and Sexual Activity   Alcohol use: Not Currently    Comment: rare   Drug use: Never   Sexual activity: Not Currently  Other Topics Concern   Not on file  Social History Narrative   Not on file   Social Determinants of Health   Financial Resource Strain: Not on file  Food Insecurity: Not on file  Transportation Needs: Not on file  Physical Activity: Not on file  Stress: Not on file   Social Connections: Not on file    Review of Systems: A 12 point ROS discussed and pertinent positives are indicated in the HPI above.  All other systems are negative.  Review of Systems  All other systems reviewed and are negative.   Vital Signs: There were no vitals taken for this visit.    Physical Exam Vitals reviewed.  Constitutional:      General: He is not in acute distress.    Appearance: Normal appearance. He is not ill-appearing.  HENT:     Head: Normocephalic and atraumatic.     Mouth/Throat:     Mouth: Mucous membranes are dry.     Pharynx: Oropharynx is clear.  Eyes:     Extraocular Movements: Extraocular movements intact.     Pupils: Pupils are equal, round, and reactive to light.  Cardiovascular:     Rate and Rhythm: Normal rate and regular  rhythm.     Pulses: Normal pulses.     Heart sounds: Normal heart sounds.  Pulmonary:     Effort: Pulmonary effort is normal. No respiratory distress.     Breath sounds: Normal breath sounds.  Abdominal:     General: Bowel sounds are normal. There is no distension.     Palpations: Abdomen is soft.     Tenderness: There is no abdominal tenderness. There is no guarding.  Musculoskeletal:     Right lower leg: No edema.     Left lower leg: No edema.  Skin:    General: Skin is warm and dry.  Neurological:     Mental Status: He is alert and oriented to person, place, and time.  Psychiatric:        Mood and Affect: Mood normal.        Behavior: Behavior normal.        Thought Content: Thought content normal.        Judgment: Judgment normal.     Imaging: NM PET Image Restage (PS) Whole Body (F-18 FDG)  Result Date: 08/18/2022 CLINICAL DATA:  Subsequent treatment strategy for multiple myeloma. Last chemotherapy administered earlier this month. EXAM: NUCLEAR MEDICINE PET WHOLE BODY TECHNIQUE: 10.3 mCi F-18 FDG was injected intravenously. Full-ring PET imaging was performed from the head to foot after the  radiotracer. CT data was obtained and used for attenuation correction and anatomic localization. Fasting blood glucose: 85 mg/dl COMPARISON:  PET-CT 10/16/2020. MRI of the lumbar spine and pelvis 01/14/2022. FINDINGS: Mediastinal blood pool activity: SUV max 2.1 HEAD/ NECK: No hypermetabolic cervical lymph nodes are identified.Fairly symmetric activity within the lymphoid tissue of Waldeyer's ring is within physiologic limits.No suspicious activity identified within the pharyngeal mucosal space. Incidental CT findings: Mild bilateral carotid atherosclerosis. CHEST: There are no hypermetabolic mediastinal, hilar or axillary lymph nodes. The previously demonstrated hypermetabolic activity in the superior left hemithorax attributed to radiation therapy has resolved. There is no hypermetabolic pulmonary activity. Incidental CT findings: Atherosclerosis of the aorta, great vessels and coronary arteries. Linear scarring in the left upper lobe and superior segment of the left lower lobe, improved from previous CT and attributed to prior radiation therapy. New part solid 8 mm left lower lobe nodule on image 49/7 without hypermetabolic activity. No other suspicious nodularity. There is mild underlying emphysema. ABDOMEN/PELVIS: There is no hypermetabolic activity within the liver, adrenal glands, spleen or pancreas. There is no hypermetabolic nodal activity in the abdomen or pelvis. Incidental CT findings: Aortic and branch vessel atherosclerosis. Mild distal colonic diverticulosis. SKELETON: New intense hypermetabolic activity posteriorly in the T12 vertebral body (SUV max 57.5) compatible with recurrent myelomatous involvement. Additional focal hypermetabolic activity anteriorly in the left iliac bone (SUV max 51.1). In the mid left upper arm, there is focal hypermetabolic activity which does not localize well due to partial exclusion of this area from the CT images, but probably localizes to the mid humerus (SUV max  12.9). No other hypermetabolic lesions are identified Incidental CT findings: Grossly stable deformity of the left scapula consistent with treated myeloma. No recurrent hypermetabolic activity in this area. Scattered mixed lytic and sclerotic lesions in the spine, right iliac crest and proximal left femur without hypermetabolic activity. Previous left femoral ORIF. EXTREMITIES: As above, focal hypermetabolic activity in the left upper arm, likely localizing to the mid humerus. No other evidence of metabolically active myeloma within the extremities. Incidental CT findings: No soft tissue masses are identified in the extremities. There are scattered  vascular calcifications. IMPRESSION: 1. New hypermetabolic lesions within the T12 vertebral body, anterior left iliac bone and left mid humerus consistent with recurrent myelomatous involvement. 2. No other hypermetabolic osseous lesions are identified. Scattered treated osseous lesions are unchanged without recurrent metabolic activity. 3. Evolving post radiation changes in the left upper lobe and superior segment of the left lower lobe with resolution of previously demonstrated hypermetabolic activity in this area. 4. New 8 mm part solid left lower lobe pulmonary nodule without hypermetabolic activity, possibly inflammatory. Attention on follow-up chest CT in 6 months recommended. 5.  Aortic Atherosclerosis (ICD10-I70.0). Electronically Signed   By: Richardean Sale M.D.   On: 08/18/2022 09:46    Labs:  CBC: Recent Labs    06/08/22 0832 07/06/22 0852 08/03/22 0835 09/08/22 0844  WBC 7.4 6.5 9.1 5.3  HGB 13.9 13.9 13.5 13.0  HCT 41.9 41.6 41.1 39.4  PLT 182 163 160 189    COAGS: No results for input(s): "INR", "APTT" in the last 8760 hours.  BMP: Recent Labs    06/08/22 0832 07/06/22 0852 08/03/22 0835 09/08/22 0844  NA 139 141 140 141  K 4.5 4.5 4.6 4.3  CL 104 107 106 107  CO2 _0 GLUCOSE 90 82 72 74  BUN _1 CALCIUM  9.7 9.9 9.1 8.9  CREATININE 1.12 1.02 1.09 1.03  GFRNONAA >60 >60 >60 >60    LIVER FUNCTION TESTS: Recent Labs    06/08/22 0832 07/06/22 0852 08/03/22 0835 09/08/22 0844  BILITOT 0.3 0.2* 0.3 0.3  AST 13* 12* 16 13*  ALT 19 16 34 21  ALKPHOS 64 69 102 71  PROT 5.7* 5.9* 6.1* 5.6*  ALBUMIN 4.4 4.2 4.2 3.9    TUMOR MARKERS: No results for input(s): "AFPTM", "CEA", "CA199", "CHROMGRNA" in the last 8760 hours.  Assessment and Plan: 71 yo male presents to IR for bone marrow biopsy to rule out recurrence of myeloma.   Risks and benefits of bone marrow biopsy and aspiration with moderate sedation was discussed with the patient and/or patient's family including, but not limited to bleeding, infection, damage to adjacent structures or low yield requiring additional tests.  All of the questions were answered and there is agreement to proceed.  Consent signed and in chart.  Thank you for this interesting consult.  I greatly enjoyed meeting Devontre Siedschlag and look forward to participating in their care.  A copy of this report was sent to the requesting provider on this date.  Electronically Signed: Tyson Alias, NP 09/16/2022, 10:53 AM   I spent a total of 20 minutes in face to face in clinical consultation, greater than 50% of which was counseling/coordinating care for myeloma.

## 2022-09-19 ENCOUNTER — Ambulatory Visit (HOSPITAL_COMMUNITY)
Admission: RE | Admit: 2022-09-19 | Discharge: 2022-09-19 | Disposition: A | Discharge status: Home / Self Care | Payer: Medicare Other | Source: Ambulatory Visit | Attending: Hematology & Oncology | Admitting: Hematology & Oncology

## 2022-09-19 ENCOUNTER — Other Ambulatory Visit: Payer: Self-pay

## 2022-09-19 ENCOUNTER — Encounter (HOSPITAL_COMMUNITY): Payer: Self-pay

## 2022-09-19 ENCOUNTER — Ambulatory Visit
Admission: RE | Admit: 2022-09-19 | Discharge: 2022-09-19 | Disposition: A | Discharge status: Home / Self Care | Payer: Medicare Other | Source: Ambulatory Visit | Attending: Radiation Oncology | Admitting: Radiation Oncology

## 2022-09-19 DIAGNOSIS — Z1379 Encounter for other screening for genetic and chromosomal anomalies: Secondary | ICD-10-CM | POA: Diagnosis not present

## 2022-09-19 DIAGNOSIS — D649 Anemia, unspecified: Secondary | ICD-10-CM | POA: Diagnosis not present

## 2022-09-19 DIAGNOSIS — Z923 Personal history of irradiation: Secondary | ICD-10-CM | POA: Insufficient documentation

## 2022-09-19 DIAGNOSIS — Z9221 Personal history of antineoplastic chemotherapy: Secondary | ICD-10-CM | POA: Diagnosis not present

## 2022-09-19 DIAGNOSIS — C9000 Multiple myeloma not having achieved remission: Secondary | ICD-10-CM | POA: Insufficient documentation

## 2022-09-19 DIAGNOSIS — R911 Solitary pulmonary nodule: Secondary | ICD-10-CM | POA: Diagnosis not present

## 2022-09-19 DIAGNOSIS — C419 Malignant neoplasm of bone and articular cartilage, unspecified: Secondary | ICD-10-CM

## 2022-09-19 DIAGNOSIS — M25552 Pain in left hip: Secondary | ICD-10-CM | POA: Insufficient documentation

## 2022-09-19 DIAGNOSIS — Z510 Encounter for antineoplastic radiation therapy: Secondary | ICD-10-CM | POA: Diagnosis not present

## 2022-09-19 LAB — RAD ONC ARIA SESSION SUMMARY
Course Elapsed Days: 0
Plan Fractions Treated to Date: 1
Plan Fractions Treated to Date: 1
Plan Fractions Treated to Date: 1
Plan Prescribed Dose Per Fraction: 2.5 Gy
Plan Prescribed Dose Per Fraction: 2.5 Gy
Plan Prescribed Dose Per Fraction: 2.5 Gy
Plan Total Fractions Prescribed: 10
Plan Total Fractions Prescribed: 10
Plan Total Fractions Prescribed: 10
Plan Total Prescribed Dose: 25 Gy
Plan Total Prescribed Dose: 25 Gy
Plan Total Prescribed Dose: 25 Gy
Reference Point Dosage Given to Date: 2.5 Gy
Reference Point Dosage Given to Date: 2.5 Gy
Reference Point Dosage Given to Date: 2.5 Gy
Reference Point Session Dosage Given: 2.5 Gy
Reference Point Session Dosage Given: 2.5 Gy
Reference Point Session Dosage Given: 2.5 Gy
Session Number: 1

## 2022-09-19 LAB — CBC WITH DIFFERENTIAL/PLATELET
Abs Immature Granulocytes: 0.04 10*3/uL (ref 0.00–0.07)
Basophils Absolute: 0 10*3/uL (ref 0.0–0.1)
Basophils Relative: 0 %
Eosinophils Absolute: 0.1 10*3/uL (ref 0.0–0.5)
Eosinophils Relative: 2 %
HCT: 38.8 % — ABNORMAL LOW (ref 39.0–52.0)
Hemoglobin: 13.1 g/dL (ref 13.0–17.0)
Immature Granulocytes: 1 %
Lymphocytes Relative: 25 %
Lymphs Abs: 1.4 10*3/uL (ref 0.7–4.0)
MCH: 33.1 pg (ref 26.0–34.0)
MCHC: 33.8 g/dL (ref 30.0–36.0)
MCV: 98 fL (ref 80.0–100.0)
Monocytes Absolute: 0.8 10*3/uL (ref 0.1–1.0)
Monocytes Relative: 14 %
Neutro Abs: 3.2 10*3/uL (ref 1.7–7.7)
Neutrophils Relative %: 58 %
Platelets: 178 10*3/uL (ref 150–400)
RBC: 3.96 MIL/uL — ABNORMAL LOW (ref 4.22–5.81)
RDW: 14.4 % (ref 11.5–15.5)
WBC: 5.4 10*3/uL (ref 4.0–10.5)
nRBC: 0 % (ref 0.0–0.2)

## 2022-09-19 MED ORDER — MIDAZOLAM HCL 2 MG/2ML IJ SOLN
INTRAMUSCULAR | Status: AC
  Filled 2022-09-19: qty 4

## 2022-09-19 MED ORDER — LIDOCAINE-EPINEPHRINE 1 %-1:100000 IJ SOLN
INTRAMUSCULAR | Status: AC | PRN
  Administered 2022-09-19: 20 mL

## 2022-09-19 MED ORDER — FENTANYL CITRATE (PF) 100 MCG/2ML IJ SOLN
INTRAMUSCULAR | Status: AC | PRN
  Administered 2022-09-19 (×2): 50 ug via INTRAVENOUS

## 2022-09-19 MED ORDER — SODIUM CHLORIDE 0.9 % IV SOLN: INTRAVENOUS | Status: DC

## 2022-09-19 MED ORDER — MIDAZOLAM HCL 2 MG/2ML IJ SOLN
INTRAMUSCULAR | Status: AC | PRN
  Administered 2022-09-19 (×2): 1 mg via INTRAVENOUS

## 2022-09-19 MED ORDER — FENTANYL CITRATE (PF) 100 MCG/2ML IJ SOLN
INTRAMUSCULAR | Status: AC
  Filled 2022-09-19: qty 2

## 2022-09-19 NOTE — Procedures (Signed)
Vascular and Interventional Radiology Procedure Note  Patient: Jeffre Enriques DOB: 1951-03-08 Medical Record Number: [id number] Note Date/Time: 09/19/22 8:49 AM   Performing Physician: Michaelle Birks, MD Assistant(s): None  Diagnosis: MM  Procedure: BONE MARROW ASPIRATION and BIOPSY  Anesthesia: Conscious Sedation Complications: None Estimated Blood Loss: Minimal Specimens: Sent for Pathology  Findings:  Successful CT-guided bone marrow aspiration and biopsy A total of 2 cores were obtained. Hemostasis of the tract was achieved using Manual Pressure.  Plan: Bed rest for 1 hours.  See detailed procedure note with images in PACS. The patient tolerated the procedure well without incident or complication and was returned to Recovery in stable condition.    Michaelle Birks, MD Vascular and Interventional Radiology [facility] Rad[facility]   Pager. [place]

## 2022-09-19 NOTE — Discharge Instructions (Signed)
Discharge Instructions:   Please call Interventional Radiology clinic [phone] with any questions or concerns.  You may remove your dressing and shower tomorrow.    Bone Marrow Aspiration and Bone Marrow Biopsy, Adult, Care After This sheet gives you information about how to care for yourself after your procedure. Your health care provider may also give you more specific instructions. If you have problems or questions, contact your health care provider. What can I expect after the procedure? After the procedure, it is common to have: Mild pain and tenderness. Swelling. Bruising. Follow these instructions at home: Puncture site care  Follow instructions from your health care provider about how to take care of the puncture site. Make sure you: Wash your hands with soap and water before and after you change your bandage (dressing). If soap and water are not available, use hand sanitizer. Change your dressing as told by your health care provider. Check your puncture site every day for signs of infection. Check for: More redness, swelling, or pain. Fluid or blood. Warmth. Pus or a bad smell. Activity Return to your normal activities as told by your health care provider. Ask your health care provider what activities are safe for you. Do not lift anything that is heavier than 10 lb (4.5 kg), or the limit that you are told, until your health care provider says that it is safe. Do not drive for 24 hours if you were given a sedative during your procedure. General instructions  Take over-the-counter and prescription medicines only as told by your health care provider. Do not take baths, swim, or use a hot tub until your health care provider approves. Ask your health care provider if you may take showers. You may only be allowed to take sponge baths. If directed, put ice on the affected area. To do this: Put ice in a plastic bag. Place a towel between your skin and the bag. Leave the ice  on for 20 minutes, 2-3 times a day. Keep all follow-up visits as told by your health care provider. This is important. Contact a health care provider if: Your pain is not controlled with medicine. You have a fever. You have more redness, swelling, or pain around the puncture site. You have fluid or blood coming from the puncture site. Your puncture site feels warm to the touch. You have pus or a bad smell coming from the puncture site. Summary After the procedure, it is common to have mild pain, tenderness, swelling, and bruising. Follow instructions from your health care provider about how to take care of the puncture site and what activities are safe for you. Take over-the-counter and prescription medicines only as told by your health care provider. Contact a health care provider if you have any signs of infection, such as fluid or blood coming from the puncture site. This information is not intended to replace advice given to you by your health care provider. Make sure you discuss any questions you have with your health care provider. Document Revised: 03/05/2019 Document Reviewed: 03/05/2019 Elsevier Patient Education  2023 Elsevier Inc.   Moderate Conscious Sedation, Adult, Care After This sheet gives you information about how to care for yourself after your procedure. Your health care provider may also give you more specific instructions. If you have problems or questions, contact your health care provider. What can I expect after the procedure? After the procedure, it is common to have: Sleepiness for several hours. Impaired judgment for several hours. Difficulty with balance. Vomiting if   you eat too soon. Follow these instructions at home: For the time period you were told by your health care provider: Rest. Do not participate in activities where you could fall or become injured. Do not drive or use machinery. Do not drink alcohol. Do not take sleeping pills or medicines that  cause drowsiness. Do not make important decisions or sign legal documents. Do not take care of children on your own. Eating and drinking  Follow the diet recommended by your health care provider. Drink enough fluid to keep your urine pale yellow. If you vomit: Drink water, juice, or soup when you can drink without vomiting. Make sure you have little or no nausea before eating solid foods. General instructions Take over-the-counter and prescription medicines only as told by your health care provider. Have a responsible adult stay with you for the time you are told. It is important to have someone help care for you until you are awake and alert. Do not smoke. Keep all follow-up visits as told by your health care provider. This is important. Contact a health care provider if: You are still sleepy or having trouble with balance after 24 hours. You feel light-headed. You keep feeling nauseous or you keep vomiting. You develop a rash. You have a fever. You have redness or swelling around the IV site. Get help right away if: You have trouble breathing. You have new-onset confusion at home. Summary After the procedure, it is common to feel sleepy, have impaired judgment, or feel nauseous if you eat too soon. Rest after you get home. Know the things you should not do after the procedure. Follow the diet recommended by your health care provider and drink enough fluid to keep your urine pale yellow. Get help right away if you have trouble breathing or new-onset confusion at home. This information is not intended to replace advice given to you by your health care provider. Make sure you discuss any questions you have with your health care provider. Document Revised: 02/14/2020 Document Reviewed: 09/12/2019 Elsevier Patient Education  2023 Elsevier Inc.  

## 2022-09-20 ENCOUNTER — Other Ambulatory Visit: Payer: Self-pay

## 2022-09-20 ENCOUNTER — Ambulatory Visit
Admission: RE | Admit: 2022-09-20 | Discharge: 2022-09-20 | Disposition: A | Discharge status: Home / Self Care | Payer: Medicare Other | Source: Ambulatory Visit | Attending: Radiation Oncology | Admitting: Radiation Oncology

## 2022-09-20 DIAGNOSIS — Z510 Encounter for antineoplastic radiation therapy: Secondary | ICD-10-CM | POA: Diagnosis not present

## 2022-09-20 LAB — RAD ONC ARIA SESSION SUMMARY
Course Elapsed Days: 1
Plan Fractions Treated to Date: 2
Plan Fractions Treated to Date: 2
Plan Fractions Treated to Date: 2
Plan Prescribed Dose Per Fraction: 2.5 Gy
Plan Prescribed Dose Per Fraction: 2.5 Gy
Plan Prescribed Dose Per Fraction: 2.5 Gy
Plan Total Fractions Prescribed: 10
Plan Total Fractions Prescribed: 10
Plan Total Fractions Prescribed: 10
Plan Total Prescribed Dose: 25 Gy
Plan Total Prescribed Dose: 25 Gy
Plan Total Prescribed Dose: 25 Gy
Reference Point Dosage Given to Date: 5 Gy
Reference Point Dosage Given to Date: 5 Gy
Reference Point Dosage Given to Date: 5 Gy
Reference Point Session Dosage Given: 2.5 Gy
Reference Point Session Dosage Given: 2.5 Gy
Reference Point Session Dosage Given: 2.5 Gy
Session Number: 2

## 2022-09-21 ENCOUNTER — Ambulatory Visit
Admission: RE | Admit: 2022-09-21 | Discharge: 2022-09-21 | Disposition: A | Discharge status: Home / Self Care | Payer: Medicare Other | Source: Ambulatory Visit | Attending: Radiation Oncology | Admitting: Radiation Oncology

## 2022-09-21 ENCOUNTER — Other Ambulatory Visit: Payer: Self-pay

## 2022-09-21 DIAGNOSIS — Z510 Encounter for antineoplastic radiation therapy: Secondary | ICD-10-CM | POA: Diagnosis not present

## 2022-09-21 LAB — RAD ONC ARIA SESSION SUMMARY
Course Elapsed Days: 2
Plan Fractions Treated to Date: 3
Plan Fractions Treated to Date: 3
Plan Fractions Treated to Date: 3
Plan Prescribed Dose Per Fraction: 2.5 Gy
Plan Prescribed Dose Per Fraction: 2.5 Gy
Plan Prescribed Dose Per Fraction: 2.5 Gy
Plan Total Fractions Prescribed: 10
Plan Total Fractions Prescribed: 10
Plan Total Fractions Prescribed: 10
Plan Total Prescribed Dose: 25 Gy
Plan Total Prescribed Dose: 25 Gy
Plan Total Prescribed Dose: 25 Gy
Reference Point Dosage Given to Date: 7.5 Gy
Reference Point Dosage Given to Date: 7.5 Gy
Reference Point Dosage Given to Date: 7.5 Gy
Reference Point Session Dosage Given: 2.5 Gy
Reference Point Session Dosage Given: 2.5 Gy
Reference Point Session Dosage Given: 2.5 Gy
Session Number: 3

## 2022-09-23 LAB — SURGICAL PATHOLOGY

## 2022-09-26 ENCOUNTER — Ambulatory Visit
Admission: RE | Admit: 2022-09-26 | Discharge: 2022-09-26 | Disposition: A | Discharge status: Home / Self Care | Payer: Medicare Other | Source: Ambulatory Visit | Attending: Radiation Oncology | Admitting: Radiation Oncology

## 2022-09-26 ENCOUNTER — Other Ambulatory Visit: Payer: Self-pay | Admitting: Hematology & Oncology

## 2022-09-26 ENCOUNTER — Other Ambulatory Visit: Payer: Self-pay

## 2022-09-26 DIAGNOSIS — Z510 Encounter for antineoplastic radiation therapy: Secondary | ICD-10-CM | POA: Diagnosis not present

## 2022-09-26 LAB — RAD ONC ARIA SESSION SUMMARY
Course Elapsed Days: 7
Plan Fractions Treated to Date: 4
Plan Fractions Treated to Date: 4
Plan Fractions Treated to Date: 4
Plan Prescribed Dose Per Fraction: 2.5 Gy
Plan Prescribed Dose Per Fraction: 2.5 Gy
Plan Prescribed Dose Per Fraction: 2.5 Gy
Plan Total Fractions Prescribed: 10
Plan Total Fractions Prescribed: 10
Plan Total Fractions Prescribed: 10
Plan Total Prescribed Dose: 25 Gy
Plan Total Prescribed Dose: 25 Gy
Plan Total Prescribed Dose: 25 Gy
Reference Point Dosage Given to Date: 10 Gy
Reference Point Dosage Given to Date: 10 Gy
Reference Point Dosage Given to Date: 10 Gy
Reference Point Session Dosage Given: 2.5 Gy
Reference Point Session Dosage Given: 2.5 Gy
Reference Point Session Dosage Given: 2.5 Gy
Session Number: 4

## 2022-09-27 ENCOUNTER — Ambulatory Visit
Admission: RE | Admit: 2022-09-27 | Discharge: 2022-09-27 | Disposition: A | Discharge status: Home / Self Care | Payer: Medicare Other | Source: Ambulatory Visit | Attending: Radiation Oncology | Admitting: Radiation Oncology

## 2022-09-27 ENCOUNTER — Other Ambulatory Visit: Payer: Self-pay

## 2022-09-27 DIAGNOSIS — Z510 Encounter for antineoplastic radiation therapy: Secondary | ICD-10-CM | POA: Diagnosis not present

## 2022-09-27 LAB — RAD ONC ARIA SESSION SUMMARY
Course Elapsed Days: 8
Plan Fractions Treated to Date: 5
Plan Fractions Treated to Date: 5
Plan Fractions Treated to Date: 5
Plan Prescribed Dose Per Fraction: 2.5 Gy
Plan Prescribed Dose Per Fraction: 2.5 Gy
Plan Prescribed Dose Per Fraction: 2.5 Gy
Plan Total Fractions Prescribed: 10
Plan Total Fractions Prescribed: 10
Plan Total Fractions Prescribed: 10
Plan Total Prescribed Dose: 25 Gy
Plan Total Prescribed Dose: 25 Gy
Plan Total Prescribed Dose: 25 Gy
Reference Point Dosage Given to Date: 12.5 Gy
Reference Point Dosage Given to Date: 12.5 Gy
Reference Point Dosage Given to Date: 12.5 Gy
Reference Point Session Dosage Given: 2.5 Gy
Reference Point Session Dosage Given: 2.5 Gy
Reference Point Session Dosage Given: 2.5 Gy
Session Number: 5

## 2022-09-28 ENCOUNTER — Encounter (HOSPITAL_COMMUNITY): Payer: Self-pay | Admitting: Hematology & Oncology

## 2022-09-28 ENCOUNTER — Ambulatory Visit
Admission: RE | Admit: 2022-09-28 | Discharge: 2022-09-28 | Disposition: A | Discharge status: Home / Self Care | Payer: Medicare Other | Source: Ambulatory Visit | Attending: Radiation Oncology | Admitting: Radiation Oncology

## 2022-09-28 ENCOUNTER — Other Ambulatory Visit: Payer: Self-pay

## 2022-09-28 ENCOUNTER — Other Ambulatory Visit: Payer: Self-pay | Admitting: Oncology

## 2022-09-28 DIAGNOSIS — Z510 Encounter for antineoplastic radiation therapy: Secondary | ICD-10-CM | POA: Diagnosis not present

## 2022-09-28 LAB — RAD ONC ARIA SESSION SUMMARY
Course Elapsed Days: 9
Plan Fractions Treated to Date: 6
Plan Fractions Treated to Date: 6
Plan Fractions Treated to Date: 6
Plan Prescribed Dose Per Fraction: 2.5 Gy
Plan Prescribed Dose Per Fraction: 2.5 Gy
Plan Prescribed Dose Per Fraction: 2.5 Gy
Plan Total Fractions Prescribed: 10
Plan Total Fractions Prescribed: 10
Plan Total Fractions Prescribed: 10
Plan Total Prescribed Dose: 25 Gy
Plan Total Prescribed Dose: 25 Gy
Plan Total Prescribed Dose: 25 Gy
Reference Point Dosage Given to Date: 15 Gy
Reference Point Dosage Given to Date: 15 Gy
Reference Point Dosage Given to Date: 15 Gy
Reference Point Session Dosage Given: 2.5 Gy
Reference Point Session Dosage Given: 2.5 Gy
Reference Point Session Dosage Given: 2.5 Gy
Session Number: 6

## 2022-09-28 MED ORDER — ONDANSETRON HCL 8 MG PO TABS: ORAL_TABLET | ORAL | 0 refills | Status: DC

## 2022-09-28 NOTE — Progress Notes (Signed)
Refill for Zofran sent to [facility] per Dr. Sondra Come. Called [place] and let her know that it has been sent.

## 2022-09-29 ENCOUNTER — Other Ambulatory Visit: Payer: Self-pay

## 2022-09-29 ENCOUNTER — Ambulatory Visit
Admission: RE | Admit: 2022-09-29 | Discharge: 2022-09-29 | Disposition: A | Discharge status: Home / Self Care | Payer: Medicare Other | Source: Ambulatory Visit | Attending: Radiation Oncology | Admitting: Radiation Oncology

## 2022-09-29 DIAGNOSIS — Z510 Encounter for antineoplastic radiation therapy: Secondary | ICD-10-CM | POA: Diagnosis not present

## 2022-09-29 LAB — RAD ONC ARIA SESSION SUMMARY
Course Elapsed Days: 10
Plan Fractions Treated to Date: 7
Plan Fractions Treated to Date: 7
Plan Fractions Treated to Date: 7
Plan Prescribed Dose Per Fraction: 2.5 Gy
Plan Prescribed Dose Per Fraction: 2.5 Gy
Plan Prescribed Dose Per Fraction: 2.5 Gy
Plan Total Fractions Prescribed: 10
Plan Total Fractions Prescribed: 10
Plan Total Fractions Prescribed: 10
Plan Total Prescribed Dose: 25 Gy
Plan Total Prescribed Dose: 25 Gy
Plan Total Prescribed Dose: 25 Gy
Reference Point Dosage Given to Date: 17.5 Gy
Reference Point Dosage Given to Date: 17.5 Gy
Reference Point Dosage Given to Date: 17.5 Gy
Reference Point Session Dosage Given: 2.5 Gy
Reference Point Session Dosage Given: 2.5 Gy
Reference Point Session Dosage Given: 2.5 Gy
Session Number: 7

## 2022-09-30 ENCOUNTER — Encounter (HOSPITAL_COMMUNITY): Payer: Self-pay | Admitting: Hematology & Oncology

## 2022-09-30 ENCOUNTER — Ambulatory Visit
Admission: RE | Admit: 2022-09-30 | Discharge: 2022-09-30 | Disposition: A | Discharge status: Home / Self Care | Payer: Medicare Other | Source: Ambulatory Visit | Attending: Radiation Oncology | Admitting: Radiation Oncology

## 2022-09-30 ENCOUNTER — Other Ambulatory Visit: Payer: Self-pay

## 2022-09-30 DIAGNOSIS — Z510 Encounter for antineoplastic radiation therapy: Secondary | ICD-10-CM | POA: Diagnosis present

## 2022-09-30 DIAGNOSIS — C9000 Multiple myeloma not having achieved remission: Secondary | ICD-10-CM | POA: Diagnosis not present

## 2022-09-30 DIAGNOSIS — C419 Malignant neoplasm of bone and articular cartilage, unspecified: Secondary | ICD-10-CM | POA: Diagnosis not present

## 2022-09-30 LAB — RAD ONC ARIA SESSION SUMMARY
Course Elapsed Days: 11
Plan Fractions Treated to Date: 8
Plan Fractions Treated to Date: 8
Plan Fractions Treated to Date: 8
Plan Prescribed Dose Per Fraction: 2.5 Gy
Plan Prescribed Dose Per Fraction: 2.5 Gy
Plan Prescribed Dose Per Fraction: 2.5 Gy
Plan Total Fractions Prescribed: 10
Plan Total Fractions Prescribed: 10
Plan Total Fractions Prescribed: 10
Plan Total Prescribed Dose: 25 Gy
Plan Total Prescribed Dose: 25 Gy
Plan Total Prescribed Dose: 25 Gy
Reference Point Dosage Given to Date: 20 Gy
Reference Point Dosage Given to Date: 20 Gy
Reference Point Dosage Given to Date: 20 Gy
Reference Point Session Dosage Given: 2.5 Gy
Reference Point Session Dosage Given: 2.5 Gy
Reference Point Session Dosage Given: 2.5 Gy
Session Number: 8

## 2022-10-03 ENCOUNTER — Other Ambulatory Visit: Payer: Self-pay

## 2022-10-03 ENCOUNTER — Ambulatory Visit
Admission: RE | Admit: 2022-10-03 | Discharge: 2022-10-03 | Disposition: A | Discharge status: Home / Self Care | Payer: Medicare Other | Source: Ambulatory Visit | Attending: Radiation Oncology | Admitting: Radiation Oncology

## 2022-10-03 DIAGNOSIS — Z510 Encounter for antineoplastic radiation therapy: Secondary | ICD-10-CM | POA: Diagnosis not present

## 2022-10-03 LAB — RAD ONC ARIA SESSION SUMMARY
Course Elapsed Days: 14
Plan Fractions Treated to Date: 9
Plan Fractions Treated to Date: 9
Plan Fractions Treated to Date: 9
Plan Prescribed Dose Per Fraction: 2.5 Gy
Plan Prescribed Dose Per Fraction: 2.5 Gy
Plan Prescribed Dose Per Fraction: 2.5 Gy
Plan Total Fractions Prescribed: 10
Plan Total Fractions Prescribed: 10
Plan Total Fractions Prescribed: 10
Plan Total Prescribed Dose: 25 Gy
Plan Total Prescribed Dose: 25 Gy
Plan Total Prescribed Dose: 25 Gy
Reference Point Dosage Given to Date: 22.5 Gy
Reference Point Dosage Given to Date: 22.5 Gy
Reference Point Dosage Given to Date: 22.5 Gy
Reference Point Session Dosage Given: 2.5 Gy
Reference Point Session Dosage Given: 2.5 Gy
Reference Point Session Dosage Given: 2.5 Gy
Session Number: 9

## 2022-10-04 ENCOUNTER — Other Ambulatory Visit: Payer: Self-pay

## 2022-10-04 ENCOUNTER — Ambulatory Visit
Admission: RE | Admit: 2022-10-04 | Discharge: 2022-10-04 | Disposition: A | Discharge status: Home / Self Care | Payer: Medicare Other | Source: Ambulatory Visit | Attending: Radiation Oncology | Admitting: Radiation Oncology

## 2022-10-04 ENCOUNTER — Encounter: Payer: Self-pay | Admitting: Radiation Oncology

## 2022-10-04 DIAGNOSIS — Z510 Encounter for antineoplastic radiation therapy: Secondary | ICD-10-CM | POA: Diagnosis not present

## 2022-10-04 LAB — RAD ONC ARIA SESSION SUMMARY
Course Elapsed Days: 15
Plan Fractions Treated to Date: 10
Plan Fractions Treated to Date: 10
Plan Fractions Treated to Date: 10
Plan Prescribed Dose Per Fraction: 2.5 Gy
Plan Prescribed Dose Per Fraction: 2.5 Gy
Plan Prescribed Dose Per Fraction: 2.5 Gy
Plan Total Fractions Prescribed: 10
Plan Total Fractions Prescribed: 10
Plan Total Fractions Prescribed: 10
Plan Total Prescribed Dose: 25 Gy
Plan Total Prescribed Dose: 25 Gy
Plan Total Prescribed Dose: 25 Gy
Reference Point Dosage Given to Date: 25 Gy
Reference Point Dosage Given to Date: 25 Gy
Reference Point Dosage Given to Date: 25 Gy
Reference Point Session Dosage Given: 2.5 Gy
Reference Point Session Dosage Given: 2.5 Gy
Reference Point Session Dosage Given: 2.5 Gy
Session Number: 10

## 2022-10-05 ENCOUNTER — Inpatient Hospital Stay (HOSPITAL_BASED_OUTPATIENT_CLINIC_OR_DEPARTMENT_OTHER): Payer: Medicare Other | Admitting: Hematology & Oncology

## 2022-10-05 ENCOUNTER — Other Ambulatory Visit: Payer: Self-pay | Admitting: *Deleted

## 2022-10-05 ENCOUNTER — Encounter: Payer: Self-pay | Admitting: Hematology & Oncology

## 2022-10-05 ENCOUNTER — Inpatient Hospital Stay: Payer: Medicare Other | Attending: Hematology & Oncology

## 2022-10-05 ENCOUNTER — Inpatient Hospital Stay: Payer: Medicare Other

## 2022-10-05 ENCOUNTER — Inpatient Hospital Stay: Payer: Medicare Other | Admitting: Licensed Clinical Social Worker

## 2022-10-05 DIAGNOSIS — C9000 Multiple myeloma not having achieved remission: Secondary | ICD-10-CM

## 2022-10-05 DIAGNOSIS — C419 Malignant neoplasm of bone and articular cartilage, unspecified: Secondary | ICD-10-CM

## 2022-10-05 DIAGNOSIS — Z923 Personal history of irradiation: Secondary | ICD-10-CM | POA: Diagnosis not present

## 2022-10-05 DIAGNOSIS — Z5112 Encounter for antineoplastic immunotherapy: Secondary | ICD-10-CM | POA: Diagnosis not present

## 2022-10-05 LAB — CBC WITH DIFFERENTIAL (CANCER CENTER ONLY)
Abs Immature Granulocytes: 0.03 10*3/uL (ref 0.00–0.07)
Basophils Absolute: 0 10*3/uL (ref 0.0–0.1)
Basophils Relative: 0 %
Eosinophils Absolute: 0.1 10*3/uL (ref 0.0–0.5)
Eosinophils Relative: 2 %
HCT: 42.1 % (ref 39.0–52.0)
Hemoglobin: 13.8 g/dL (ref 13.0–17.0)
Immature Granulocytes: 0 %
Lymphocytes Relative: 5 %
Lymphs Abs: 0.3 10*3/uL — ABNORMAL LOW (ref 0.7–4.0)
MCH: 32.9 pg (ref 26.0–34.0)
MCHC: 32.8 g/dL (ref 30.0–36.0)
MCV: 100.5 fL — ABNORMAL HIGH (ref 80.0–100.0)
Monocytes Absolute: 0.7 10*3/uL (ref 0.1–1.0)
Monocytes Relative: 10 %
Neutro Abs: 5.8 10*3/uL (ref 1.7–7.7)
Neutrophils Relative %: 83 %
Platelet Count: 145 10*3/uL — ABNORMAL LOW (ref 150–400)
RBC: 4.19 MIL/uL — ABNORMAL LOW (ref 4.22–5.81)
RDW: 14.1 % (ref 11.5–15.5)
WBC Count: 7 10*3/uL (ref 4.0–10.5)
nRBC: 0 % (ref 0.0–0.2)

## 2022-10-05 LAB — LACTATE DEHYDROGENASE: LDH: 147 U/L (ref 98–192)

## 2022-10-05 LAB — CMP (CANCER CENTER ONLY)
ALT: 20 U/L (ref 0–44)
AST: 15 U/L (ref 15–41)
Albumin: 4.4 g/dL (ref 3.5–5.0)
Alkaline Phosphatase: 56 U/L (ref 38–126)
Anion gap: 6 (ref 5–15)
BUN: 15 mg/dL (ref 8–23)
CO2: 28 mmol/L (ref 22–32)
Calcium: 8.3 mg/dL — ABNORMAL LOW (ref 8.9–10.3)
Chloride: 104 mmol/L (ref 98–111)
Creatinine: 0.88 mg/dL (ref 0.61–1.24)
GFR, Estimated: >60 mL/min (ref >60)
Glucose, Bld: 119 mg/dL — ABNORMAL HIGH (ref 70–99)
Potassium: 4.4 mmol/L (ref 3.5–5.1)
Sodium: 138 mmol/L (ref 135–145)
Total Bilirubin: 0.4 mg/dL (ref 0.3–1.2)
Total Protein: 6.1 g/dL — ABNORMAL LOW (ref 6.5–8.1)

## 2022-10-05 MED ORDER — DARATUMUMAB-HYALURONIDASE-FIHJ 1800-30000 MG-UT/15ML ~~LOC~~ SOLN
1800.0000 mg | Freq: Once | SUBCUTANEOUS | Status: AC
  Administered 2022-10-05: 1800 mg via SUBCUTANEOUS
  Filled 2022-10-05: qty 15

## 2022-10-05 MED ORDER — DEXAMETHASONE 4 MG PO TABS: ORAL_TABLET | ORAL | 3 refills | Status: DC

## 2022-10-05 MED ORDER — ACETAMINOPHEN 325 MG PO TABS: 650.0000 mg | ORAL_TABLET | Freq: Once | ORAL | Status: DC

## 2022-10-05 MED ORDER — DIPHENHYDRAMINE HCL 25 MG PO CAPS: 50.0000 mg | ORAL_CAPSULE | Freq: Once | ORAL | Status: DC

## 2022-10-05 MED ORDER — PROCHLORPERAZINE MALEATE 10 MG PO TABS: 10.0000 mg | ORAL_TABLET | Freq: Four times a day (QID) | ORAL | 3 refills | Status: DC | PRN

## 2022-10-05 MED ORDER — PROCHLORPERAZINE MALEATE 10 MG PO TABS
10.0000 mg | ORAL_TABLET | Freq: Four times a day (QID) | ORAL | Status: DC | PRN
  Administered 2022-10-05: 10 mg via ORAL
  Filled 2022-10-05: qty 1

## 2022-10-05 MED ORDER — FAMOTIDINE 20 MG PO TABS: 40.0000 mg | ORAL_TABLET | Freq: Once | ORAL | Status: DC

## 2022-10-05 NOTE — Progress Notes (Signed)
Bruce Smith  Initial Assessment   Bruce Smith is a 71 y.o. year old male accompanied by spouse. Clinical Social Smith was referred by nurse navigator for assessment of psychosocial needs.   SDOH (Social Determinants of Health) assessments performed: Yes SDOH Interventions    Flowsheet Row Clinical Support from 10/05/2022 in [facility] Interventions   Food Insecurity Interventions Intervention Not Indicated  Housing Interventions Intervention Not Indicated  Transportation Interventions Intervention Not Indicated  Utilities Interventions Intervention Not Indicated  Financial Strain Interventions Intervention Not Indicated       SDOH Screenings   Food Insecurity: No Food Insecurity (10/05/2022)  Housing: Low Risk  (10/05/2022)  Transportation Needs: No Transportation Needs (10/05/2022)  Utilities: Not At Risk (10/05/2022)  Financial Resource Strain: Low Risk  (10/05/2022)  Tobacco Use: High Risk (10/05/2022)     Distress Screen completed: No    09/05/2022    7:37 AM  ONCBCN DISTRESS SCREENING  Screening Type Initial Screening  Distress experienced in past week (1-10) 0  Emotional problem type Adjusting to illness  Physical Problem type Tingling hands/feet  Other Patient can be reached at [phone]      Family/Social Information:  Housing Arrangement: patient lives with his wife, Bruce Smith. Family members/support persons in your life? Family Transportation concerns: no  Employment: Retired  Income source: Paediatric nurse concerns: No Type of concern: None Food access concerns: no Religious or spiritual practice: Not known Services Currently in place:  [facility] Medicare  Coping/ Adjustment to diagnosis: Patient understands treatment plan and what happens next? yes Concerns about diagnosis and/or treatment: I'm not especially worried about anything.  He reports feeling fatigued due to receiving  radiation. Patient reported stressors:  None per patient. Hopes and/or priorities: Family. Patient enjoys time with family/ friends Current coping skills/ strengths: Active sense of humor , Capable of independent living , Communication skills , Financial means , General fund of knowledge , Motivation for treatment/growth , and Supportive family/friends     SUMMARY: Current SDOH Barriers:  None identified by patient.  Clinical Social Smith Clinical Goal(s):  No clinical social Smith goals at this time  Interventions: Discussed common feeling and emotions when being diagnosed with cancer, and the importance of support during treatment Informed patient of the support team roles and support services at [facility] Provided [facility] contact information and encouraged patient to call with any questions or concerns Provided patient with information about the [employer] and [employer], as well as support groups.  His wife is a cancer survivor and is excited about the programs offered.   Follow Up Plan: Patient will contact CSW with any support or resource needs Patient verbalizes understanding of plan: Yes    Rodman Pickle Shannette Tabares, LCSW

## 2022-10-05 NOTE — Patient Instructions (Signed)
[facility] [place]  Discharge Instructions: Thank you for choosing [facility] to provide your oncology and hematology care.   If you have a lab appointment with the [facility], please go directly to the [facility] and check in at the registration area.  Wear comfortable clothing and clothing appropriate for easy access to any Portacath or PICC line.   We strive to give you quality time with your provider. You may need to reschedule your appointment if you arrive late (15 or more minutes).  Arriving late affects you and other patients whose appointments are after yours.  Also, if you miss three or more appointments without notifying the office, you may be dismissed from the clinic at the provider's discretion.      For prescription refill requests, have your pharmacy contact our office and allow 72 hours for refills to be completed.    Today you received the following chemotherapy and/or immunotherapy agents Darzalex      To help prevent nausea and vomiting after your treatment, we encourage you to take your nausea medication as directed.  BELOW ARE SYMPTOMS THAT SHOULD BE REPORTED IMMEDIATELY: *FEVER GREATER THAN 100.4 F (38 C) OR HIGHER *CHILLS OR SWEATING *NAUSEA AND VOMITING THAT IS NOT CONTROLLED WITH YOUR NAUSEA MEDICATION *UNUSUAL SHORTNESS OF BREATH *UNUSUAL BRUISING OR BLEEDING *URINARY PROBLEMS (pain or burning when urinating, or frequent urination) *BOWEL PROBLEMS (unusual diarrhea, constipation, pain near the anus) TENDERNESS IN MOUTH AND THROAT WITH OR WITHOUT PRESENCE OF ULCERS (sore throat, sores in mouth, or a toothache) UNUSUAL RASH, SWELLING OR PAIN  UNUSUAL VAGINAL DISCHARGE OR ITCHING   Items with * indicate a potential emergency and should be followed up as soon as possible or go to the Emergency Department if any problems should occur.  Please show the CHEMOTHERAPY ALERT CARD or IMMUNOTHERAPY ALERT CARD at check-in to the  Emergency Department and triage nurse. Should you have questions after your visit or need to cancel or reschedule your appointment, please contact [facility]  [phone] and follow the prompts.  Office hours are 8:00 a.m. to 4:30 p.m. Monday - Friday. Please note that voicemails left after 4:00 p.m. may not be returned until the following business day.  We are closed weekends and major holidays. You have access to a nurse at all times for urgent questions. Please call the main number to the clinic [phone] and follow the prompts.  For any non-urgent questions, you may also contact your provider using MyChart. We now offer e-Visits for anyone 39 and older to request care online for non-urgent symptoms. For details visit [email or web address].   Also download the MyChart app! Go to the app store, search "MyChart", open the app, select [facility], and log in with your MyChart username and password.  Masks are optional in the cancer centers. If you would like for your care team to wear a mask while they are taking care of you, please let them know. You may have one support person who is at least 71 years old accompany you for your appointments.

## 2022-10-05 NOTE — Progress Notes (Signed)
Hematology and Oncology Follow Up Visit  Bruce Smith [id number] [birth year] 71 y.o. 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 - t(11:14), 13q-, 1p-,1q-  Past Therapy: Status post surgical repair of left femur-07/14/2020 XRT to left shoulder and left hip Xgeva 120 mg subcu every 3 months-next dose in December 2022  Faspro/Velcade/Revlimid/Decadron -- s/p cycle #2 -- start on       08/06/2020  ( Revlimid is 14 on /14 off)    Current Therapy:        ASCT -- [facility] on -01/22/2021 Faspro - q month -- maintenance -- start on 05/13/2021 Zometa 4 mg IV every 3 months-next dose in 10/2022  Radiation therapy to T12, left iliac and left humerus --completed on 10/04/2022   Interim History:  Bruce Smith is here today with his wife for follow-up.  Surprisingly, when we last saw him, he had a positive a PET scan.  This is quite unusual since all of his labs have always looked quite good without any obvious evidence of recurrent myeloma.  We did go ahead and do a bone marrow biopsy on him.  This was done on 09/19/2022.  The pathology report ([id number]) showed a normocellular marrow without any evidence of myeloma.  Again, his serum studies have not shown any evidence of recurrence.  He is undergoing radiation therapy.  He did finish that yesterday.  He is little bit tired.  Has little bit of nausea.  He says Zofran helps.  We may have to try him on a little Decadron with the Zofran.  He had a good Thanksgiving with his family.  He is got have a very busy Christmas holiday with his family.  He has had no change in bowel or bladder habits.  He has had no rashes.  He does have the chronic pain in his legs.  Surprise enough, his wife is a survivor of lung cancer.  She is going to have a follow-up CT scan for her disease tomorrow.  He has had no bleeding.  There has been no issues with fever.  He has had no cough  or shortness of breath.  Currently, I would have said that his performance status is ECOG 1.    I realize that his initial myeloma did have some adverse cytogenetics.  This could certainly be an indicator for [place].  He and his wife did have a wonderful time in [place] and in [place], [place].  He did bag a deer out in [place].   He has had no problems with appetite.  He has had no change in bowel or bladder habits.  He has had no cough or shortness of breath.  Currently, I would say that his performance status is ECOG 1.    Medications:  Allergies as of 10/05/2022   No Known Allergies      Medication List        Accurate as of October 05, 2022  2:13 PM. If you have any questions, ask your nurse or doctor.          acetaminophen 650 MG CR tablet Commonly known as: TYLENOL Take 1,300 mg by mouth in the morning, at noon, in the evening, and at bedtime.   acyclovir 400 MG tablet Commonly known as: ZOVIRAX Take 1 tablet (400 mg total) by mouth 2 (two) times daily.   aspirin EC 81 MG tablet Take by mouth daily.   calcium carbonate 1250 (500 Ca)  MG tablet Commonly known as: OS-CAL - dosed in mg of elemental calcium Take 1 tablet (500 mg of elemental calcium total) by mouth 2 (two) times daily with a meal.   celecoxib 100 MG capsule Commonly known as: CELEBREX TAKE 1 CAPSULE BY MOUTH 2 TIMES DAILY WITH FOOD AS NEEDED FOR PAIN & INFLAMMATION   levETIRAcetam 500 MG tablet Commonly known as: KEPPRA TAKE 1 TABLET BY MOUTH 2 TIMES A DAY   loperamide 2 MG capsule Commonly known as: IMODIUM Take 1 capsule (2 mg total) by mouth as needed for diarrhea or loose stools.   metoprolol succinate 25 MG 24 hr tablet Commonly known as: TOPROL-XL Take 25 mg by mouth daily.   ondansetron 8 MG tablet Commonly known as: ZOFRAN TAKE 1 TABLET BY MOUTH EVERY 8 HOURS AS NEEDED NAUSEA AND VOMITING   pantoprazole 40 MG tablet Commonly known as: PROTONIX TAKE 1 TABLET BY MOUTH 2  TIMES A DAY BEFORE MEALS FOR ACID REFLUX   polyethylene glycol powder 17 GM/SCOOP powder Commonly known as: GLYCOLAX/MIRALAX Take by mouth daily.   pregabalin 200 MG capsule Commonly known as: LYRICA TAKE 1 CAPSULE BY MOUTH 2 TIMES DAILY   tamsulosin 0.4 MG Caps capsule Commonly known as: FLOMAX TAKE 1 CAPSULE BY MOUTH AT BEDTIME   traMADol 50 MG tablet Commonly known as: ULTRAM TAKE TWO TABLETS BY MOUTH EVERY 6 HOURS AS NEEDED FOR PAIN        Allergies: No Known Allergies  Past Medical History, Surgical history, Social history, and Family History were reviewed and updated.  Review of Systems: Review of Systems  Constitutional: Negative.   HENT: Negative.    Eyes: Negative.   Respiratory: Negative.    Cardiovascular: Negative.   Gastrointestinal: Negative.   Genitourinary: Negative.   Musculoskeletal:  Positive for joint pain.  Skin: Negative.   Neurological: Negative.   Endo/Heme/Allergies: Negative.   Psychiatric/Behavioral: Negative.       Physical Exam:  weight is 198 lb 12.8 oz (90.2 kg). His oral temperature is 98.3 F (36.8 C). His blood pressure is 150/63 (abnormal) and his pulse is 56 (abnormal). His respiration is 17 and oxygen saturation is 98%.   Wt Readings from Last 3 Encounters:  10/05/22 198 lb 12.8 oz (90.2 kg)  09/19/22 204 lb 2.3 oz (92.6 kg)  09/08/22 204 lb 1.9 oz (92.6 kg)    Physical Exam Vitals reviewed.  HENT:     Head: Normocephalic and atraumatic.  Eyes:     Pupils: Pupils are equal, round, and reactive to light.  Cardiovascular:     Rate and Rhythm: Normal rate and regular rhythm.     Heart sounds: Normal heart sounds.  Pulmonary:     Effort: Pulmonary effort is normal.     Breath sounds: Normal breath sounds.  Abdominal:     General: Bowel sounds are normal.     Palpations: Abdomen is soft.  Musculoskeletal:        General: No tenderness or deformity. Normal range of motion.     Cervical back: Normal range of motion.      Comments: There is pain to palpation in the left hip area.  This is in the lateral hip area.  He has some decreased range of motion.  Lymphadenopathy:     Cervical: No cervical adenopathy.  Skin:    General: Skin is warm and dry.     Findings: No erythema or rash.  Neurological:     Mental Status: He is alert  and oriented to person, place, and time.  Psychiatric:        Behavior: Behavior normal.        Thought Content: Thought content normal.        Judgment: Judgment normal.     Lab Results  Component Value Date   WBC 7.0 10/05/2022   HGB 13.8 10/05/2022   HCT 42.1 10/05/2022   MCV 100.5 (H) 10/05/2022   PLT 145 (L) 10/05/2022   Lab Results  Component Value Date   FERRITIN 938 (H) 02/23/2021   IRON 67 02/23/2021   TIBC 156 (L) 02/23/2021   UIBC 90 (L) 02/23/2021   IRONPCTSAT 43 02/23/2021   Lab Results  Component Value Date   RBC 4.19 (L) 10/05/2022   Lab Results  Component Value Date   KPAFRELGTCHN 4.9 09/08/2022   LAMBDASER 2.4 (L) 09/08/2022   KAPLAMBRATIO 2.04 (H) 09/08/2022   Lab Results  Component Value Date   IGGSERUM 254 (L) 09/08/2022   IGA 12 (L) 09/08/2022   IGMSERUM 7 (L) 09/08/2022   Lab Results  Component Value Date   TOTALPROTELP 5.1 (L) 09/08/2022   ALBUMINELP 3.5 09/08/2022   A1GS 0.2 09/08/2022   A2GS 0.7 09/08/2022   BETS 0.6 (L) 09/08/2022   GAMS 0.2 (L) 09/08/2022   MSPIKE Not Observed 09/08/2022     Chemistry      Component Value Date/Time   NA 138 10/05/2022 1138   K 4.4 10/05/2022 1138   CL 104 10/05/2022 1138   CO2 28 10/05/2022 1138   BUN 15 10/05/2022 1138   CREATININE 0.88 10/05/2022 1138      Component Value Date/Time   CALCIUM 8.3 (L) 10/05/2022 1138   ALKPHOS 56 10/05/2022 1138   AST 15 10/05/2022 1138   ALT 20 10/05/2022 1138   BILITOT 0.4 10/05/2022 1138       Impression and Plan: Mr. Greulich is a very pleasant 71 yo gentleman with kappa light chain myeloma with an autologous stem cell transplant with  [facility] on 01/22/2021.   Again, the bone marrow certainly was adequate.  There was no evidence of myeloma.  His serum studies have never shown myeloma recurrence.  We do not have back a 24-hour urine on him yet.  I am glad he got that radiation therapy.  We will have to repeat a PET scan, probably in early February to see how that looks.  We will proceed with his daratumumab.  I really think this is helpful for him.  We will plan to get him back in another month.   Hillery Aldo, MD 12/6/20232:13 PM

## 2022-10-06 ENCOUNTER — Other Ambulatory Visit: Payer: Medicare Other

## 2022-10-06 ENCOUNTER — Ambulatory Visit: Payer: Medicare Other

## 2022-10-06 ENCOUNTER — Ambulatory Visit: Payer: Medicare Other | Admitting: Hematology & Oncology

## 2022-10-06 LAB — KAPPA/LAMBDA LIGHT CHAINS
Kappa free light chain: 3.3 mg/L (ref 3.3–19.4)
Kappa, lambda light chain ratio: 1.38 (ref 0.26–1.65)
Lambda free light chains: 2.4 mg/L — ABNORMAL LOW (ref 5.7–26.3)

## 2022-10-07 LAB — PROTEIN ELECTROPHORESIS, SERUM, WITH REFLEX
A/G Ratio: 1.7 (ref 0.7–1.7)
Albumin ELP: 3.6 g/dL (ref 2.9–4.4)
Alpha-1-Globulin: 0.2 g/dL (ref 0.0–0.4)
Alpha-2-Globulin: 0.8 g/dL (ref 0.4–1.0)
Beta Globulin: 0.8 g/dL (ref 0.7–1.3)
Gamma Globulin: 0.3 g/dL — ABNORMAL LOW (ref 0.4–1.8)
Globulin, Total: 2.1 g/dL — ABNORMAL LOW (ref 2.2–3.9)
Total Protein ELP: 5.7 g/dL — ABNORMAL LOW (ref 6.0–8.5)

## 2022-10-08 LAB — IGG, IGA, IGM
IgA: 15 mg/dL — ABNORMAL LOW (ref 61–437)
IgG (Immunoglobin G), Serum: 302 mg/dL — ABNORMAL LOW (ref 603–1613)
IgM (Immunoglobulin M), Srm: 8 mg/dL — ABNORMAL LOW (ref 15–143)

## 2022-10-10 ENCOUNTER — Other Ambulatory Visit: Payer: Self-pay | Admitting: Hematology & Oncology

## 2022-10-10 DIAGNOSIS — C9000 Multiple myeloma not having achieved remission: Secondary | ICD-10-CM

## 2022-10-10 DIAGNOSIS — R627 Adult failure to thrive: Secondary | ICD-10-CM

## 2022-10-10 DIAGNOSIS — E440 Moderate protein-calorie malnutrition: Secondary | ICD-10-CM

## 2022-10-20 ENCOUNTER — Other Ambulatory Visit: Payer: Self-pay | Admitting: Hematology & Oncology

## 2022-10-27 NOTE — Progress Notes (Signed)
The patient is on daratumumab maintenance therapy without dexamethasone. After discussion with the provider, the offset time of daratumumab was changed to 30 minutes, starting 11/01/2022, in order to reduce patient wait time. A calendar was not sent to the patient because he is not taking dexamethasone at home.  Laray Anger, PharmD PGY-2 Pharmacy Resident Hematology/Oncology [phone]  10/27/2022 3:32 PM

## 2022-11-01 ENCOUNTER — Encounter: Payer: Self-pay | Admitting: Radiation Oncology

## 2022-11-01 ENCOUNTER — Inpatient Hospital Stay: Payer: Medicare Other

## 2022-11-01 ENCOUNTER — Encounter: Payer: Self-pay | Admitting: Hematology & Oncology

## 2022-11-01 ENCOUNTER — Other Ambulatory Visit: Payer: Self-pay

## 2022-11-01 ENCOUNTER — Inpatient Hospital Stay: Payer: Medicare Other | Attending: Hematology & Oncology

## 2022-11-01 ENCOUNTER — Inpatient Hospital Stay: Payer: Medicare Other | Admitting: Hematology & Oncology

## 2022-11-01 VITALS — BP 138/63 | HR 57 | Temp 98.4°F | Resp 18 | Wt 203.0 lb

## 2022-11-01 DIAGNOSIS — G8929 Other chronic pain: Secondary | ICD-10-CM | POA: Insufficient documentation

## 2022-11-01 DIAGNOSIS — Z923 Personal history of irradiation: Secondary | ICD-10-CM | POA: Diagnosis not present

## 2022-11-01 DIAGNOSIS — S42102A Fracture of unspecified part of scapula, left shoulder, initial encounter for closed fracture: Secondary | ICD-10-CM | POA: Diagnosis not present

## 2022-11-01 DIAGNOSIS — Z5112 Encounter for antineoplastic immunotherapy: Secondary | ICD-10-CM | POA: Insufficient documentation

## 2022-11-01 DIAGNOSIS — C9000 Multiple myeloma not having achieved remission: Secondary | ICD-10-CM

## 2022-11-01 DIAGNOSIS — C419 Malignant neoplasm of bone and articular cartilage, unspecified: Secondary | ICD-10-CM

## 2022-11-01 LAB — CBC WITH DIFFERENTIAL (CANCER CENTER ONLY)
Abs Immature Granulocytes: 0.05 10*3/uL (ref 0.00–0.07)
Basophils Absolute: 0 10*3/uL (ref 0.0–0.1)
Basophils Relative: 0 %
Eosinophils Absolute: 0.1 10*3/uL (ref 0.0–0.5)
Eosinophils Relative: 3 %
HCT: 40.8 % (ref 39.0–52.0)
Hemoglobin: 13.5 g/dL (ref 13.0–17.0)
Immature Granulocytes: 1 %
Lymphocytes Relative: 11 %
Lymphs Abs: 0.4 10*3/uL — ABNORMAL LOW (ref 0.7–4.0)
MCH: 33.3 pg (ref 26.0–34.0)
MCHC: 33.1 g/dL (ref 30.0–36.0)
MCV: 100.5 fL — ABNORMAL HIGH (ref 80.0–100.0)
Monocytes Absolute: 0.8 10*3/uL (ref 0.1–1.0)
Monocytes Relative: 19 %
Neutro Abs: 2.7 10*3/uL (ref 1.7–7.7)
Neutrophils Relative %: 66 %
Platelet Count: 144 10*3/uL — ABNORMAL LOW (ref 150–400)
RBC: 4.06 MIL/uL — ABNORMAL LOW (ref 4.22–5.81)
RDW: 13.6 % (ref 11.5–15.5)
WBC Count: 4 10*3/uL (ref 4.0–10.5)
nRBC: 0 % (ref 0.0–0.2)

## 2022-11-01 LAB — CMP (CANCER CENTER ONLY)
ALT: 20 U/L (ref 0–44)
AST: 13 U/L — ABNORMAL LOW (ref 15–41)
Albumin: 4 g/dL (ref 3.5–5.0)
Alkaline Phosphatase: 71 U/L (ref 38–126)
Anion gap: 7 (ref 5–15)
BUN: 18 mg/dL (ref 8–23)
CO2: 28 mmol/L (ref 22–32)
Calcium: 9 mg/dL (ref 8.9–10.3)
Chloride: 108 mmol/L (ref 98–111)
Creatinine: 0.99 mg/dL (ref 0.61–1.24)
GFR, Estimated: >60 mL/min (ref >60)
Glucose, Bld: 93 mg/dL (ref 70–99)
Potassium: 4.7 mmol/L (ref 3.5–5.1)
Sodium: 143 mmol/L (ref 135–145)
Total Bilirubin: 0.3 mg/dL (ref 0.3–1.2)
Total Protein: 5.9 g/dL — ABNORMAL LOW (ref 6.5–8.1)

## 2022-11-01 LAB — LACTATE DEHYDROGENASE: LDH: 147 U/L (ref 98–192)

## 2022-11-01 MED ORDER — DIPHENHYDRAMINE HCL 25 MG PO CAPS: 50.0000 mg | ORAL_CAPSULE | Freq: Once | ORAL | Status: DC

## 2022-11-01 MED ORDER — SODIUM CHLORIDE 0.9 % IV SOLN: Freq: Once | INTRAVENOUS | Status: DC

## 2022-11-01 MED ORDER — DARATUMUMAB-HYALURONIDASE-FIHJ 1800-30000 MG-UT/15ML ~~LOC~~ SOLN
1800.0000 mg | Freq: Once | SUBCUTANEOUS | Status: AC
  Administered 2022-11-01: 1800 mg via SUBCUTANEOUS
  Filled 2022-11-01: qty 15

## 2022-11-01 MED ORDER — FAMOTIDINE 20 MG PO TABS: 40.0000 mg | ORAL_TABLET | Freq: Once | ORAL | Status: DC

## 2022-11-01 MED ORDER — ZOLEDRONIC ACID 4 MG/100ML IV SOLN
4.0000 mg | Freq: Once | INTRAVENOUS | Status: DC
  Filled 2022-11-01: qty 100

## 2022-11-01 MED ORDER — ACETAMINOPHEN 325 MG PO TABS: 650.0000 mg | ORAL_TABLET | Freq: Once | ORAL | Status: DC

## 2022-11-01 NOTE — Progress Notes (Signed)
Hematology and Oncology Follow Up Visit  Bruce Smith [id number] [birth year] 72 y.o. 11/01/2022   Principle Diagnosis:  Kappa light chain myeloma-extensive bone disease-pathologic fracture of left scapula and left femur - t(11:14), 13q-, 1p-,1q-  Past Therapy: Status post surgical repair of left femur-07/14/2020 XRT to left shoulder and left hip Xgeva 120 mg subcu every 3 months-next dose in December 2022  Faspro/Velcade/Revlimid/Decadron -- s/p cycle #2 -- start on       08/06/2020  ( Revlimid is 14 on /14 off)    Current Therapy:        ASCT -- [facility] on -01/22/2021 Faspro - q month -- maintenance -- start on 05/13/2021 Zometa 4 mg IV every 3 months-next dose in 01/2023  Radiation therapy to T12, left iliac and left humerus --completed on 10/04/2022   Interim History:  Bruce Smith is here today with his wife for follow-up.  He is doing quite well.  He did have a very nice Christmas and New Years.  He is eating well.  His pain is under good control.  He has some chronic pain in the legs.  Nothing is on tramadol for this.  He completed radiation therapy about a month ago.  He is feeling much better now that he is further out from radiation.  He has had no fever.  He has had no change in bowel or bladder habits.  He has had no headache.  He has had no rashes.  His last Kappa light chain was 0.3 mg/dL.  His last 24-hour urine that was done in October did not show any monoclonal light chain in the urine.  We will have to repeat a PET scan on him in February to see how everything looks.  Overall, I would have to say that his performance status is probably ECOG 1.    Medications:  Allergies as of 11/01/2022   No Known Allergies      Medication List        Accurate as of November 01, 2022 11:20 AM. If you have any questions, ask your nurse or doctor.          acetaminophen 650 MG CR tablet Commonly known as: TYLENOL Take 1,300 mg by mouth in the morning, at noon, in the evening,  and at bedtime.   acyclovir 400 MG tablet Commonly known as: ZOVIRAX Take 1 tablet (400 mg total) by mouth 2 (two) times daily.   aspirin EC 81 MG tablet Take by mouth daily.   calcium carbonate 1250 (500 Ca) MG tablet Commonly known as: OS-CAL - dosed in mg of elemental calcium Take 1 tablet (500 mg of elemental calcium total) by mouth 2 (two) times daily with a meal.   celecoxib 100 MG capsule Commonly known as: CELEBREX TAKE 1 CAPSULE BY MOUTH 2 TIMES DAILY WITH FOOD AS NEEDED FOR PAIN & INFLAMMATION   dexamethasone 4 MG tablet Commonly known as: DECADRON Take two tablets (8 mg) every eight hours as needed with Zofran for nausea.   levETIRAcetam 500 MG tablet Commonly known as: KEPPRA TAKE 1 TABLET BY MOUTH 2 TIMES A DAY   loperamide 2 MG capsule Commonly known as: IMODIUM Take 1 capsule (2 mg total) by mouth as needed for diarrhea or loose stools.   metoprolol succinate 25 MG 24 hr tablet Commonly known as: TOPROL-XL Take 25 mg by mouth daily.   ondansetron 8 MG tablet Commonly known as: ZOFRAN TAKE 1 TABLET BY MOUTH EVERY 8 HOURS AS NEEDED NAUSEA AND VOMITING  pantoprazole 40 MG tablet Commonly known as: PROTONIX TAKE 1 TABLET BY MOUTH 2 TIMES A DAY BEFORE MEALS FOR ACID REFLUX   polyethylene glycol powder 17 GM/SCOOP powder Commonly known as: GLYCOLAX/MIRALAX Take by mouth daily.   pregabalin 200 MG capsule Commonly known as: LYRICA TAKE 1 CAPSULE BY MOUTH 2 TIMES DAILY   prochlorperazine 10 MG tablet Commonly known as: COMPAZINE Take 1 tablet (10 mg total) by mouth every 6 (six) hours as needed for nausea or vomiting.   tamsulosin 0.4 MG Caps capsule Commonly known as: FLOMAX TAKE 1 CAPSULE BY MOUTH AT BEDTIME   traMADol 50 MG tablet Commonly known as: ULTRAM TAKE TWO TABLETS BY MOUTH EVERY 6 HOURS AS NEEDED FOR PAIN        Allergies: No Known Allergies  Past Medical History, Surgical history, Social history, and Family History were reviewed  and updated.  Review of Systems: Review of Systems  Constitutional: Negative.   HENT: Negative.    Eyes: Negative.   Respiratory: Negative.    Cardiovascular: Negative.   Gastrointestinal: Negative.   Genitourinary: Negative.   Musculoskeletal:  Positive for joint pain.  Skin: Negative.   Neurological: Negative.   Endo/Heme/Allergies: Negative.   Psychiatric/Behavioral: Negative.       Physical Exam:  weight is 203 lb (92.1 kg). His oral temperature is 98.4 F (36.9 C). His blood pressure is 138/63 and his pulse is 57 (abnormal). His respiration is 18 and oxygen saturation is 99%.   Wt Readings from Last 3 Encounters:  11/01/22 203 lb (92.1 kg)  10/05/22 198 lb 12.8 oz (90.2 kg)  09/19/22 204 lb 2.3 oz (92.6 kg)    Physical Exam Vitals reviewed.  HENT:     Head: Normocephalic and atraumatic.  Eyes:     Pupils: Pupils are equal, round, and reactive to light.  Cardiovascular:     Rate and Rhythm: Normal rate and regular rhythm.     Heart sounds: Normal heart sounds.  Pulmonary:     Effort: Pulmonary effort is normal.     Breath sounds: Normal breath sounds.  Abdominal:     General: Bowel sounds are normal.     Palpations: Abdomen is soft.  Musculoskeletal:        General: No tenderness or deformity. Normal range of motion.     Cervical back: Normal range of motion.     Comments: There is pain to palpation in the left hip area.  This is in the lateral hip area.  He has some decreased range of motion.  Lymphadenopathy:     Cervical: No cervical adenopathy.  Skin:    General: Skin is warm and dry.     Findings: No erythema or rash.  Neurological:     Mental Status: He is alert and oriented to person, place, and time.  Psychiatric:        Behavior: Behavior normal.        Thought Content: Thought content normal.        Judgment: Judgment normal.      Lab Results  Component Value Date   WBC 4.0 11/01/2022   HGB 13.5 11/01/2022   HCT 40.8 11/01/2022   MCV  100.5 (H) 11/01/2022   PLT 144 (L) 11/01/2022   Lab Results  Component Value Date   FERRITIN 938 (H) 02/23/2021   IRON 67 02/23/2021   TIBC 156 (L) 02/23/2021   UIBC 90 (L) 02/23/2021   IRONPCTSAT 43 02/23/2021   Lab Results  Component Value  Date   RBC 4.06 (L) 11/01/2022   Lab Results  Component Value Date   KPAFRELGTCHN 3.3 10/05/2022   LAMBDASER 2.4 (L) 10/05/2022   KAPLAMBRATIO 1.38 10/05/2022   Lab Results  Component Value Date   IGGSERUM 302 (L) 10/05/2022   IGA 15 (L) 10/05/2022   IGMSERUM 8 (L) 10/05/2022   Lab Results  Component Value Date   TOTALPROTELP 5.7 (L) 10/05/2022   ALBUMINELP 3.6 10/05/2022   A1GS 0.2 10/05/2022   A2GS 0.8 10/05/2022   BETS 0.8 10/05/2022   GAMS 0.3 (L) 10/05/2022   MSPIKE Not Observed 10/05/2022     Chemistry      Component Value Date/Time   NA 143 11/01/2022 1003   K 4.7 11/01/2022 1003   CL 108 11/01/2022 1003   CO2 28 11/01/2022 1003   BUN 18 11/01/2022 1003   CREATININE 0.99 11/01/2022 1003      Component Value Date/Time   CALCIUM 9.0 11/01/2022 1003   ALKPHOS 71 11/01/2022 1003   AST 13 (L) 11/01/2022 1003   ALT 20 11/01/2022 1003   BILITOT 0.3 11/01/2022 1003       Impression and Plan: Mr. Chapa is a very pleasant 72 yo gentleman with 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   It will be very interesting to see what the PET scan actually does show.  I do realize that he does have some adverse cytogenetics that we have to be mindful of.  I would let him come back in 4 weeks.  After he comes back for his next Faspro dose, we will then plan for a follow-up PET scan.  He will get his Zometa today.   Hillery Aldo, MD 1/2/202411:20 AM

## 2022-11-01 NOTE — Progress Notes (Signed)
Patient due for Zometa today.  Got ready to start IV and Patient confident that he did not refuse an IV for his "bone medicine".  Explained that it is documented that he did on October 4th.  Patient states that he is  sure that he got it in Subcutaneous injection.  Explained that [facility] declared Delton See is now non preferred.  Patient would like to check with his insurance company before he consents to receive Zometa.  Information given to him to assist in his call

## 2022-11-01 NOTE — Patient Instructions (Signed)
Daratumumab Injection What is this medication? DARATUMUMAB (dar a toom ue mab) treats multiple myeloma, a type of bone marrow cancer. It works by helping your immune system slow or stop the spread of cancer cells. It is a monoclonal antibody. This medicine may be used for other purposes; ask your health care provider or pharmacist if you have questions. COMMON BRAND NAME(S): DARZALEX What should I tell my care team before I take this medication? They need to know if you have any of these conditions: Hereditary fructose intolerance Infection, such as chickenpox, herpes, hepatitis B virus Lung or breathing disease, such as asthma, COPD An unusual or allergic reaction to daratumumab, sorbitol, other medications, foods, dyes, or preservatives Pregnant or trying to get pregnant Breast-feeding How should I use this medication? This medication is injected into a vein. It is given by your care team in a hospital or clinic setting. Talk to your care team about the use of this medication in children. Special care may be needed. Overdosage: If you think you have taken too much of this medicine contact a poison control center or emergency room at once. NOTE: This medicine is only for you. Do not share this medicine with others. What if I miss a dose? Keep appointments for follow-up doses. It is important not to miss your dose. Call your care team if you are unable to keep an appointment. What may interact with this medication? Interactions have not been studied. This list may not describe all possible interactions. Give your health care provider a list of all the medicines, herbs, non-prescription drugs, or dietary supplements you use. Also tell them if you smoke, drink alcohol, or use illegal drugs. Some items may interact with your medicine. What should I watch for while using this medication? Your condition will be monitored carefully while you are receiving this medication. This medication can cause  serious allergic reactions. To reduce your risk, your care team may give you other medication to take before receiving this one. Be sure to follow the directions from your care team. This medication can affect the results of blood tests to match your blood type. These changes can last for up to 6 months after the final dose. Your care team will do blood tests to match your blood type before you start treatment. Tell all of your care team that you are being treated with this medication before receiving a blood transfusion. This medication can affect the results of some tests used to determine treatment response; extra tests may be needed to evaluate response. Talk to your care team if you wish to become pregnant or think you are pregnant. This medication can cause serious birth defects if taken during pregnancy and for 3 months after the last dose. A reliable form of contraception is recommended while taking this medication and for 3 months after the last dose. Talk to your care team about effective forms of contraception. Do not breast-feed while taking this medication. What side effects may I notice from receiving this medication? Side effects that you should report to your care team as soon as possible: Allergic reactions--skin rash, itching, hives, swelling of the face, lips, tongue, or throat Infection--fever, chills, cough, sore throat, wounds that don't heal, pain or trouble when passing urine, general feeling of discomfort or being unwell Infusion reactions--chest pain, shortness of breath or trouble breathing, feeling faint or lightheaded Unusual bruising or bleeding Side effects that usually do not require medical attention (report to your care team if they continue  or are bothersome): Constipation Diarrhea Fatigue Nausea Pain, tingling, or numbness in the hands or feet Swelling of the ankles, hands, or feet This list may not describe all possible side effects. Call your doctor for medical  advice about side effects. You may report side effects to FDA at 1-800-FDA-1088. Where should I keep my medication? This medication is given in a hospital or clinic. It will not be stored at home. NOTE: This sheet is a summary. It may not cover all possible information. If you have questions about this medicine, talk to your doctor, pharmacist, or health care provider.  2023 Elsevier/Gold Standard (2022-02-09 00:00:00)  

## 2022-11-02 LAB — KAPPA/LAMBDA LIGHT CHAINS
Kappa free light chain: 3.2 mg/L — ABNORMAL LOW (ref 3.3–19.4)
Kappa, lambda light chain ratio: 1.23 (ref 0.26–1.65)
Lambda free light chains: 2.6 mg/L — ABNORMAL LOW (ref 5.7–26.3)

## 2022-11-02 LAB — IGG, IGA, IGM
IgA: 12 mg/dL — ABNORMAL LOW (ref 61–437)
IgG (Immunoglobin G), Serum: 289 mg/dL — ABNORMAL LOW (ref 603–1613)
IgM (Immunoglobulin M), Srm: 7 mg/dL — ABNORMAL LOW (ref 15–143)

## 2022-11-02 NOTE — Progress Notes (Incomplete)
  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 A diagnosis of Kappa light chain myeloma (HCC) was also pertinent to this visit.   Kappa light chain myeloma with extensive bone disease and pathologic fracture of the left scapula and left femur     Recent PET scan with new hypermetabolic lesions within the T12 vertebral body, anterior left iliac bone, and left mid humerus consistent with recurrent myelomatous involvement    Intent: Curative  Radiation Treatment Dates: 09/19/2022 through 10/04/2022 Site Technique Total Dose (Gy) Dose per Fx (Gy) Completed Fx Beam Energies  Humerus, Left: Ext_L Complex 25/25 2.5 10/10 10X  Thoracic Spine: Spine 3D 25/25 2.5 10/10 15X  Ilium, Left: Pelvis_L Complex 25/25 2.5 10/10 15X   Narrative: The patient tolerated radiation therapy relatively well. On the date of his final treatment, the patient endorsed fatigue, nausea, and some pain to her feet and legs rated at a 7/10. His nausea was managed well with medications and he endorsed overall improvement in his back pain with treatment.   Plan: The patient will follow-up with radiation oncology in one month .  ________________________________________________ -----------------------------------  Blair Promise, PhD, MD  This document serves as a record of services personally performed by Gery Pray, MD. It was created on his behalf by Roney Mans, a trained medical scribe. The creation of this record is based on the scribe's  personal observations and the provider's statements to them. This document has been checked and approved by the attending provider.

## 2022-11-02 NOTE — Progress Notes (Signed)
Radiation Oncology         [phone] ________________________________  Name: Bruce Smith MRN: [id number]  Date: 11/03/2022  DOB: 06-06-1951  Follow-Up Visit Note  CC: Derrill Center., MD  Volanda Napoleon, MD  No diagnosis found.  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 A diagnosis of Kappa light chain myeloma (HCC) was also pertinent to this visit.   Kappa light chain myeloma with extensive bone disease and pathologic fracture of the left scapula and left femur     Recent PET scan with new hypermetabolic lesions within the T12 vertebral body, anterior left iliac bone, and left mid humerus consistent with recurrent myelomatous involvement  Interval Since Last Radiation: approximately 1 month  Intent: Curative  Radiation Treatment Dates: 09/19/2022 through 10/04/2022 Site Technique Total Dose (Gy) Dose per Fx (Gy) Completed Fx Beam Energies  Humerus, Left: Ext_L Complex 25/25 2.5 10/10 10X  Thoracic Spine: Spine 3D 25/25 2.5 10/10 15X  Ilium, Left: Pelvis_L Complex 25/25 2.5 10/10 15X   Narrative:  The patient returns today for routine follow-up. The patient tolerated radiation therapy relatively well. On the date of his final treatment, the patient endorsed fatigue, nausea, and some pain to his feet and legs rated at a 7/10. His nausea was managed well with medications and he endorsed overall improvement in his back pain with treatment.      To review, the patient had a bone marrow biopsy performed on 09/19/22 (on the date of his first radiation treatment) which showed normocellular marrow without any evidence of myeloma. (His serum studies have also shown no evidence of recurrence).   In the interval, the patient has continued on immunotherapy consisting of darzalex faspro under Dr. [name].     During his most recent follow-up visit with Dr. [name] on 11/01/22, the patient's chronic pain was noted to be  well controlled with his current regimen. He also reported improvement is his side effects from radiation therapy.   (He most recently received Zometa on 11/01/21 during his visit with Dr. [name]).              Of note: he is due for a follow-up PET scan in February following his next faspro dose.             ***  Allergies:  has No Known Allergies.  Meds: Current Outpatient Medications  Medication Sig Dispense Refill   acetaminophen (TYLENOL) 650 MG CR tablet Take 1,300 mg by mouth in the morning, at noon, in the evening, and at bedtime.     acyclovir (ZOVIRAX) 400 MG tablet Take 1 tablet (400 mg total) by mouth 2 (two) times daily. 180 tablet 0   aspirin 81 MG EC tablet Take by mouth daily.     calcium carbonate (OS-CAL - DOSED IN MG OF ELEMENTAL CALCIUM) 1250 (500 Ca) MG tablet Take 1 tablet (500 mg of elemental calcium total) by mouth 2 (two) times daily with a meal. 60 tablet 0   celecoxib (CELEBREX) 100 MG capsule TAKE 1 CAPSULE BY MOUTH 2 TIMES DAILY WITH FOOD AS NEEDED FOR PAIN & INFLAMMATION 180 capsule 0   dexamethasone (DECADRON) 4 MG tablet Take two tablets (8 mg) every eight hours as needed with Zofran for nausea. 60 tablet 3   levETIRAcetam (KEPPRA) 500 MG tablet TAKE 1 TABLET BY MOUTH 2 TIMES A DAY 60 tablet 3   loperamide (IMODIUM) 2 MG capsule Take 1 capsule (2 mg  total) by mouth as needed for diarrhea or loose stools. 30 capsule 0   metoprolol succinate (TOPROL-XL) 25 MG 24 hr tablet Take 25 mg by mouth daily.     ondansetron (ZOFRAN) 8 MG tablet TAKE 1 TABLET BY MOUTH EVERY 8 HOURS AS NEEDED NAUSEA AND VOMITING 180 tablet 0   pantoprazole (PROTONIX) 40 MG tablet TAKE 1 TABLET BY MOUTH 2 TIMES A DAY BEFORE MEALS FOR ACID REFLUX 180 tablet 0   polyethylene glycol powder (GLYCOLAX/MIRALAX) 17 GM/SCOOP powder Take by mouth daily.     pregabalin (LYRICA) 200 MG capsule TAKE 1 CAPSULE BY MOUTH 2 TIMES DAILY 180 capsule 0   prochlorperazine (COMPAZINE) 10 MG tablet Take 1  tablet (10 mg total) by mouth every 6 (six) hours as needed for nausea or vomiting. 30 tablet 3   tamsulosin (FLOMAX) 0.4 MG CAPS capsule TAKE 1 CAPSULE BY MOUTH AT BEDTIME 90 capsule 0   traMADol (ULTRAM) 50 MG tablet TAKE TWO TABLETS BY MOUTH EVERY 6 HOURS AS NEEDED FOR PAIN 240 tablet 0   No current facility-administered medications for this encounter.    Physical Findings: The patient is in no acute distress. Patient is alert and oriented.  vitals were not taken for this visit. .  No significant changes. Lungs are clear to auscultation bilaterally. Heart has regular rate and rhythm. No palpable cervical, supraclavicular, or axillary adenopathy. Abdomen soft, non-tender, normal bowel sounds.   Lab Findings: Lab Results  Component Value Date   WBC 4.0 11/01/2022   HGB 13.5 11/01/2022   HCT 40.8 11/01/2022   MCV 100.5 (H) 11/01/2022   PLT 144 (L) 11/01/2022    Radiographic Findings: No results found.  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 A diagnosis of Kappa light chain myeloma (HCC) was also pertinent to this visit.   Kappa light chain myeloma with extensive bone disease and pathologic fracture of the left scapula and left femur     Recent PET scan with new hypermetabolic lesions within the T12 vertebral body, anterior left iliac bone, and left mid humerus consistent with recurrent myelomatous involvement  The patient is recovering from the effects of radiation.  ***  Plan:  ***   *** minutes of total time was spent for this patient encounter, including preparation, face-to-face counseling with the patient and coordination of care, physical exam, and documentation of the encounter. ____________________________________  Blair Promise, PhD, MD  This document serves as a record of services personally performed by Gery Pray, MD. It was created on his behalf by Roney Mans, a trained medical  scribe. The creation of this record is based on the scribe's personal observations and the provider's statements to them. This document has been checked and approved by the attending provider.

## 2022-11-03 ENCOUNTER — Ambulatory Visit
Admission: RE | Admit: 2022-11-03 | Discharge: 2022-11-03 | Disposition: A | Discharge status: Home / Self Care | Payer: Medicare Other | Source: Ambulatory Visit | Attending: Radiation Oncology | Admitting: Radiation Oncology

## 2022-11-03 ENCOUNTER — Encounter: Payer: Self-pay | Admitting: Radiation Oncology

## 2022-11-03 ENCOUNTER — Other Ambulatory Visit: Payer: Self-pay

## 2022-11-03 VITALS — BP 144/67 | HR 58 | Temp 97.9°F | Resp 20 | Ht 68.0 in | Wt 210.2 lb

## 2022-11-03 DIAGNOSIS — C9000 Multiple myeloma not having achieved remission: Secondary | ICD-10-CM | POA: Diagnosis present

## 2022-11-03 HISTORY — DX: Personal history of irradiation: Z92.3

## 2022-11-03 LAB — PROTEIN ELECTROPHORESIS, SERUM, WITH REFLEX
A/G Ratio: 1.7 (ref 0.7–1.7)
Albumin ELP: 3.3 g/dL (ref 2.9–4.4)
Alpha-1-Globulin: 0.2 g/dL (ref 0.0–0.4)
Alpha-2-Globulin: 0.7 g/dL (ref 0.4–1.0)
Beta Globulin: 0.8 g/dL (ref 0.7–1.3)
Gamma Globulin: 0.3 g/dL — ABNORMAL LOW (ref 0.4–1.8)
Globulin, Total: 2 g/dL — ABNORMAL LOW (ref 2.2–3.9)
Total Protein ELP: 5.3 g/dL — ABNORMAL LOW (ref 6.0–8.5)

## 2022-11-03 NOTE — Progress Notes (Addendum)
Bruce Smith is here today for follow up post radiation to the left humerus, T-spine and Pelvis  They completed their radiation on: 10/04/22  Does the patient complain of any of the following:  Pain:States that he is having pain in his left leg. Abdominal bloating: none Diarrhea/Constipation: none Nausea/Vomiting: nausea sometime Blood in Urine or Stool: none Post radiation skin changes: none FXO:VANV Skin no issues  Additional comments if applicable: Vitals:   91/66/06 1122  BP: (!) 144/67  Pulse: (!) 58  Resp: 20  Temp: 97.9 F (36.6 C)  SpO2: 97%  Weight: 95.3 kg  Height: '5\' 8"'$  (1.727 m)    .

## 2022-11-04 ENCOUNTER — Other Ambulatory Visit: Payer: Self-pay

## 2022-11-07 ENCOUNTER — Telehealth: Payer: Self-pay | Admitting: *Deleted

## 2022-11-07 NOTE — Telephone Encounter (Signed)
Received a call from Maudie Mercury, patients wife who wanted to discuss the switch from [place] to [place] last visit.  Told Kim that insurance stipulated that Delton See is now nonpreferred and wants [place] to give Zometa.  I told her I was not aware of why this change was made at this time.  Suggested to kim that she contact insurance company and inquire as to why this change was made and if a change was possible.  Maudie Mercury said she would do this and call us back.

## 2022-11-08 NOTE — Radiation Completion Notes (Signed)
Patient Name: Bruce Smith, Bruce Smith MRN: [id number] Date of Birth: 1951/09/17 Referring Physician: Burney Gauze, M.D. Date of Service: 2022-11-08 Radiation Oncologist: Teryl Lucy, M.D. [place]                             Radiation Oncology End of Treatment Note     Diagnosis: C90.00 Multiple myeloma not having achieved remission Intent: Curative     ==========DELIVERED PLANS==========  First Treatment Date: 2022-09-19 - Last Treatment Date: 2022-10-04   Plan Name: Ext_L_Mid_Hum Site: Humerus, Left Technique: Isodose Plan Mode: Photon Dose Per Fraction: 2.5 Gy Prescribed Dose (Delivered / Prescribed): 25 Gy / 25 Gy Prescribed Fxs (Delivered / Prescribed): 10 / 10   Plan Name: Spine_T Site: Thoracic Spine Technique: 3D Mode: Photon Dose Per Fraction: 2.5 Gy Prescribed Dose (Delivered / Prescribed): 25 Gy / 25 Gy Prescribed Fxs (Delivered / Prescribed): 10 / 10   Plan Name: Pelvis_L_Iliu Site: I[place], Left Technique: Isodose Plan Mode: Photon Dose Per Fraction: 2.5 Gy Prescribed Dose (Delivered / Prescribed): 25 Gy / 25 Gy Prescribed Fxs (Delivered / Prescribed): 10 / 10     ==========ON TREATMENT VISIT DATES========== 2022-09-20, 2022-09-27, 2022-10-04     ==========UPCOMING VISITS==========       ==========APPENDIX - ON TREATMENT VISIT NOTES==========   PatEd 2020-07-28 Ongoing education performed.   ImpPlan 2020-07-28 The patient is tolerating radiation. Continue treatment as planned.   PhysExam 2020-07-28 Alert, no acute distress.   ProgNote 2020-07-28 Patient just finished his first tx. He had a right lumbar bx today. Has back pain of a 5 when sitting and 10 when up. Has lost 22 lbs in 45 days. Patient has a poor appetite and has ongoing nausea. He has tried Zofan,phenergan and ativan without success.   PatEd 2020-08-04 Ongoing education performed.   ImpPlan 2020-08-04 The patient is tolerating radiation. Continue  treatment as planned.   PhysExam 2020-08-04 Alert, no acute distress.   ProgNote 2020-08-04 Patient states having sharp pain in his left hip with movement. Patient states mild fatigue. Patient denies shortness of breath. Patient denies having a cough. Patient states having a sore throat and mild difficult swallowing. Patient denies nausea or vomiting. Patient states diarrhea. Patient states mild dysuria at the start of urination. Patient denies any swelling on the left femur. Patient states that he is using the walker to prevent falls. Patient states that he can walk without the walker but limps.    PatEd 2020-08-10 Ongoing education performed.   ImpPlan 2020-08-10 The patient is tolerating radiation. Continue treatment as planned.   PhysExam 2020-08-10 Alert, no acute distress.   ProgNote 2020-08-10 Patient rates his pain in his right hip and lower back as as "6." He has ongoing nausea with a 3 lb. wt loss this week. He drinks Gatorade and Ensure. He sees the nutritionist and medical oncology tomorrow. EOT appointment card given. Patient is asking if Lidocaine patches would help him.   PatEd 2022-09-20 Ongoing education performed.   ImpPlan 2022-09-20 The patient is tolerating radiation. Continue treatment as planned.   PhysExam 2022-09-20 Alert, no acute distress.   ProgNote 2022-09-20 Specific Site [ T-spine, pelvis, humerus ] Changes from last week/visit? [ No ] Pain? [ No ] Fatigue? [ No ] Skin irritation? [ No ] Current medication regimen: [ No ] Need refills: [ No ] Additional  Weekly Progress Notes [ Patient reports having nausea which is relieved with Zofran.  ]  PatEd 2022-09-27 Ongoing education performed.   ImpPlan 2022-09-27 The patient is tolerating radiation. Continue treatment as planned.   PhysExam 2022-09-27 Alert, no acute distress.   ProgNote 2022-09-27 Specific Site [ T-spine, pelvis, humerus ] Changes from last week/visit? [ No ] Pain? [  Yes- left leg, rates pain at 7/10 with ambulation.  ] Fatigue? [ Yes ] Skin irritation? [ No ] Current medication regimen: [ No ] Need refills: [ Yes- Zofran ] Additional  Weekly Progress Notes [ Patient reports increased nausea. Requesting refill on zofran.  ]    PatEd 2022-09-28 Ongoing education performed.   ImpPlan 2022-09-28 The patient is tolerating radiation. Continue treatment as planned.   PhysExam 2022-09-28 Alert, no acute distress.   PatEd 2022-10-04 Ongoing education performed.   ImpPlan 2022-10-04 The patient is tolerating radiation. Continue treatment as planned.   PhysExam 2022-10-04 Alert, no acute distress.   ProgNote 2022-10-04 Specific Site [ T-spine, pelvis, humerus  ] Changes from last week/visit? [ No ] Pain? [ Yes- feet and legs, rates 7/10 ] Fatigue? [ Yes ] Skin irritation? [ No ] Current medication regimen: [  ] Need refills: [ No ] Additional  Weekly Progress Notes [  ]

## 2022-11-24 ENCOUNTER — Other Ambulatory Visit: Payer: Self-pay

## 2022-11-24 ENCOUNTER — Other Ambulatory Visit: Payer: Self-pay | Admitting: Hematology & Oncology

## 2022-11-29 ENCOUNTER — Encounter: Payer: Self-pay | Admitting: Hematology & Oncology

## 2022-11-29 ENCOUNTER — Inpatient Hospital Stay: Payer: Medicare Other

## 2022-11-29 ENCOUNTER — Inpatient Hospital Stay (HOSPITAL_BASED_OUTPATIENT_CLINIC_OR_DEPARTMENT_OTHER): Payer: Medicare Other | Admitting: Hematology & Oncology

## 2022-11-29 VITALS — BP 137/67 | HR 56

## 2022-11-29 VITALS — BP 129/60 | HR 55 | Temp 98.0°F | Resp 16 | Ht 68.0 in | Wt 202.0 lb

## 2022-11-29 DIAGNOSIS — C9000 Multiple myeloma not having achieved remission: Secondary | ICD-10-CM | POA: Diagnosis not present

## 2022-11-29 DIAGNOSIS — Z5112 Encounter for antineoplastic immunotherapy: Secondary | ICD-10-CM | POA: Diagnosis not present

## 2022-11-29 DIAGNOSIS — C419 Malignant neoplasm of bone and articular cartilage, unspecified: Secondary | ICD-10-CM

## 2022-11-29 LAB — CMP (CANCER CENTER ONLY)
ALT: 24 U/L (ref 0–44)
AST: 15 U/L (ref 15–41)
Albumin: 4 g/dL (ref 3.5–5.0)
Alkaline Phosphatase: 81 U/L (ref 38–126)
Anion gap: 8 (ref 5–15)
BUN: 17 mg/dL (ref 8–23)
CO2: 27 mmol/L (ref 22–32)
Calcium: 9.1 mg/dL (ref 8.9–10.3)
Chloride: 107 mmol/L (ref 98–111)
Creatinine: 0.92 mg/dL (ref 0.61–1.24)
GFR, Estimated: >60 mL/min (ref >60)
Glucose, Bld: 95 mg/dL (ref 70–99)
Potassium: 4 mmol/L (ref 3.5–5.1)
Sodium: 142 mmol/L (ref 135–145)
Total Bilirubin: 0.3 mg/dL (ref 0.3–1.2)
Total Protein: 5.6 g/dL — ABNORMAL LOW (ref 6.5–8.1)

## 2022-11-29 LAB — CBC WITH DIFFERENTIAL (CANCER CENTER ONLY)
Abs Immature Granulocytes: 0.05 10*3/uL (ref 0.00–0.07)
Basophils Absolute: 0 10*3/uL (ref 0.0–0.1)
Basophils Relative: 0 %
Eosinophils Absolute: 0.1 10*3/uL (ref 0.0–0.5)
Eosinophils Relative: 2 %
HCT: 40.2 % (ref 39.0–52.0)
Hemoglobin: 13.4 g/dL (ref 13.0–17.0)
Immature Granulocytes: 1 %
Lymphocytes Relative: 9 %
Lymphs Abs: 0.4 10*3/uL — ABNORMAL LOW (ref 0.7–4.0)
MCH: 32.9 pg (ref 26.0–34.0)
MCHC: 33.3 g/dL (ref 30.0–36.0)
MCV: 98.8 fL (ref 80.0–100.0)
Monocytes Absolute: 0.5 10*3/uL (ref 0.1–1.0)
Monocytes Relative: 12 %
Neutro Abs: 3.5 10*3/uL (ref 1.7–7.7)
Neutrophils Relative %: 76 %
Platelet Count: 161 10*3/uL (ref 150–400)
RBC: 4.07 MIL/uL — ABNORMAL LOW (ref 4.22–5.81)
RDW: 12.9 % (ref 11.5–15.5)
WBC Count: 4.6 10*3/uL (ref 4.0–10.5)
nRBC: 0 % (ref 0.0–0.2)

## 2022-11-29 LAB — LACTATE DEHYDROGENASE: LDH: 132 U/L (ref 98–192)

## 2022-11-29 MED ORDER — HEPARIN SOD (PORK) LOCK FLUSH 100 UNIT/ML IV SOLN: 500.0000 [IU] | Freq: Once | INTRAVENOUS | Status: DC | PRN

## 2022-11-29 MED ORDER — ALTEPLASE 2 MG IJ SOLR: 2.0000 mg | Freq: Once | INTRAMUSCULAR | Status: DC | PRN

## 2022-11-29 MED ORDER — ZOLEDRONIC ACID 4 MG/100ML IV SOLN
4.0000 mg | Freq: Once | INTRAVENOUS | Status: AC
  Administered 2022-11-29: 4 mg via INTRAVENOUS
  Filled 2022-11-29: qty 100

## 2022-11-29 MED ORDER — SODIUM CHLORIDE 0.9% FLUSH: 3.0000 mL | Freq: Once | INTRAVENOUS | Status: DC | PRN

## 2022-11-29 MED ORDER — DARATUMUMAB-HYALURONIDASE-FIHJ 1800-30000 MG-UT/15ML ~~LOC~~ SOLN
1800.0000 mg | Freq: Once | SUBCUTANEOUS | Status: AC
  Administered 2022-11-29: 1800 mg via SUBCUTANEOUS
  Filled 2022-11-29: qty 15

## 2022-11-29 MED ORDER — ACETAMINOPHEN 325 MG PO TABS: 650.0000 mg | ORAL_TABLET | Freq: Once | ORAL | Status: DC

## 2022-11-29 MED ORDER — SODIUM CHLORIDE 0.9% FLUSH: 10.0000 mL | Freq: Once | INTRAVENOUS | Status: DC | PRN

## 2022-11-29 MED ORDER — SODIUM CHLORIDE 0.9 % IV SOLN: Freq: Once | INTRAVENOUS | Status: AC

## 2022-11-29 MED ORDER — FAMOTIDINE 20 MG PO TABS: 40.0000 mg | ORAL_TABLET | Freq: Once | ORAL | Status: DC

## 2022-11-29 MED ORDER — DIPHENHYDRAMINE HCL 25 MG PO CAPS: 50.0000 mg | ORAL_CAPSULE | Freq: Once | ORAL | Status: DC

## 2022-11-29 MED ORDER — HEPARIN SOD (PORK) LOCK FLUSH 100 UNIT/ML IV SOLN: 250.0000 [IU] | Freq: Once | INTRAVENOUS | Status: DC | PRN

## 2022-11-29 NOTE — Progress Notes (Signed)
Hematology and Oncology Follow Up Visit  Bruce Smith [id number] Dec 10, 1950 72 y.o. 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 is here today with his wife for follow-up.  As always, he is doing pretty well.  We will go ahead and repeat a PET scan on him next month to see how everything looks with his bones.  Again, his myeloma studies really have not shown any obvious recurrent disease.  His last Kappa light chain was 0.3 mg/dL.  He has had no problem with infections.  He has had no problems with bony pain.  He has had no cough or shortness of breath.  There is no change in bowel or bladder habits.  He has had no leg swelling.  He has had no rashes.  Overall, I would say his performance status is ECOG 1.    Medications:  Allergies as of 11/29/2022   No Known Allergies      Medication List        Accurate as of November 29, 2022  9:42 AM. If you have any questions, ask your nurse or doctor.          acetaminophen 650 MG CR tablet Commonly known as: TYLENOL Take 500 mg by mouth in the morning, at noon, in the evening, and at bedtime. Patient states that he is taking 500 mg   acyclovir 400 MG tablet Commonly known as: ZOVIRAX Take 1 tablet (400 mg total) by mouth 2 (two) times daily.   aspirin EC 81 MG tablet Take by  mouth daily.   calcium carbonate 1250 (500 Ca) MG tablet Commonly known as: OS-CAL - dosed in mg of elemental calcium Take 1 tablet (500 mg of elemental calcium total) by mouth 2 (two) times daily with a meal.   celecoxib 100 MG capsule Commonly known as: CELEBREX TAKE 1 CAPSULE BY MOUTH 2 TIMES DAILY WITH FOOD AS NEEDED FOR PAIN & INFLAMMATION   dexamethasone 4 MG tablet Commonly known as: DECADRON Take two tablets (8 mg) every eight hours as needed with Zofran for nausea.   levETIRAcetam 500 MG tablet Commonly known as: KEPPRA TAKE 1 TABLET BY MOUTH 2 TIMES A DAY   loperamide 2 MG capsule Commonly known as: IMODIUM Take 1 capsule (2 mg total) by mouth as needed for diarrhea or loose stools.   metoprolol succinate 25 MG 24 hr tablet Commonly known as: TOPROL-XL Take 25 mg by mouth daily.   ondansetron 8 MG tablet Commonly known as: ZOFRAN TAKE 1 TABLET BY MOUTH EVERY 8 HOURS AS NEEDED NAUSEA AND VOMITING   pantoprazole 40 MG tablet Commonly known as: PROTONIX TAKE 1 TABLET BY MOUTH 2 TIMES A DAY BEFORE MEALS FOR ACID REFLUX   polyethylene glycol powder 17 GM/SCOOP powder Commonly known as: GLYCOLAX/MIRALAX Take  by mouth daily.   pregabalin 200 MG capsule Commonly known as: LYRICA TAKE 1 CAPSULE BY MOUTH 2 TIMES DAILY   prochlorperazine 10 MG tablet Commonly known as: COMPAZINE Take 1 tablet (10 mg total) by mouth every 6 (six) hours as needed for nausea or vomiting.   tamsulosin 0.4 MG Caps capsule Commonly known as: FLOMAX TAKE 1 CAPSULE BY MOUTH AT BEDTIME   traMADol 50 MG tablet Commonly known as: ULTRAM TAKE TWO TABLETS BY MOUTH EVERY 6 HOURS AS NEEDED FOR PAIN        Allergies: No Known Allergies  Past Medical History, Surgical history, Social history, and Family History were reviewed and updated.  Review of Systems: Review of Systems  Constitutional: Negative.   HENT: Negative.    Eyes: Negative.   Respiratory: Negative.    Cardiovascular:  Negative.   Gastrointestinal: Negative.   Genitourinary: Negative.   Musculoskeletal:  Positive for joint pain.  Skin: Negative.   Neurological: Negative.   Endo/Heme/Allergies: Negative.   Psychiatric/Behavioral: Negative.       Physical Exam:  height is '5\' 8"'$  (1.727 m) and weight is 202 lb (91.6 kg). His oral temperature is 98 F (36.7 C). His blood pressure is 129/60 and his pulse is 55 (abnormal). His respiration is 16 and oxygen saturation is 97%.   Wt Readings from Last 3 Encounters:  11/29/22 202 lb (91.6 kg)  11/03/22 210 lb 3.2 oz (95.3 kg)  11/01/22 203 lb (92.1 kg)    Physical Exam Vitals reviewed.  HENT:     Head: Normocephalic and atraumatic.  Eyes:     Pupils: Pupils are equal, round, and reactive to light.  Cardiovascular:     Rate and Rhythm: Normal rate and regular rhythm.     Heart sounds: Normal heart sounds.  Pulmonary:     Effort: Pulmonary effort is normal.     Breath sounds: Normal breath sounds.  Abdominal:     General: Bowel sounds are normal.     Palpations: Abdomen is soft.  Musculoskeletal:        General: No tenderness or deformity. Normal range of motion.     Cervical back: Normal range of motion.     Comments: There is pain to palpation in the left hip area.  This is in the lateral hip area.  He has some decreased range of motion.  Lymphadenopathy:     Cervical: No cervical adenopathy.  Skin:    General: Skin is warm and dry.     Findings: No erythema or rash.  Neurological:     Mental Status: He is alert and oriented to person, place, and time.  Psychiatric:        Behavior: Behavior normal.        Thought Content: Thought content normal.        Judgment: Judgment normal.     Lab Results  Component Value Date   WBC 4.6 11/29/2022   HGB 13.4 11/29/2022   HCT 40.2 11/29/2022   MCV 98.8 11/29/2022   PLT 161 11/29/2022   Lab Results  Component Value Date   FERRITIN 938 (H) 02/23/2021   IRON 67 02/23/2021   TIBC 156 (L)  02/23/2021   UIBC 90 (L) 02/23/2021   IRONPCTSAT 43 02/23/2021   Lab Results  Component Value Date   RBC 4.07 (L) 11/29/2022   Lab Results  Component Value Date   KPAFRELGTCHN 3.2 (L) 11/01/2022   LAMBDASER 2.6 (L) 11/01/2022   KAPLAMBRATIO 1.23 11/01/2022  Lab Results  Component Value Date   IGGSERUM 289 (L) 11/01/2022   IGA 12 (L) 11/01/2022   IGMSERUM 7 (L) 11/01/2022   Lab Results  Component Value Date   TOTALPROTELP 5.3 (L) 11/01/2022   ALBUMINELP 3.3 11/01/2022   A1GS 0.2 11/01/2022   A2GS 0.7 11/01/2022   BETS 0.8 11/01/2022   GAMS 0.3 (L) 11/01/2022   MSPIKE Not Observed 11/01/2022     Chemistry      Component Value Date/Time   NA 142 11/29/2022 0842   K 4.0 11/29/2022 0842   CL 107 11/29/2022 0842   CO2 27 11/29/2022 0842   BUN 17 11/29/2022 0842   CREATININE 0.92 11/29/2022 0842      Component Value Date/Time   CALCIUM 9.1 11/29/2022 0842   ALKPHOS 81 11/29/2022 0842   AST 15 11/29/2022 0842   ALT 24 11/29/2022 0842   BILITOT 0.3 11/29/2022 0842       Impression and Plan: Mr. Croft is a very pleasant 72 yo gentleman with 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   For right now, we will go ahead with the daratumumab.  We will see what his PET scan shows.  I will set this up for about 3 weeks.  He will get his Zometa today.  I will plan to have him come back to see me in another month.   Volanda Napoleon, MD 1/30/20249:42 AM

## 2022-11-29 NOTE — Patient Instructions (Signed)
[facility]  Discharge Instructions: Thank you for choosing [facility] to provide your oncology and hematology care.   If you have a lab appointment with the Cancer Center, please go directly to the Cancer Center and check in at the registration area.  Wear comfortable clothing and clothing appropriate for easy access to any Portacath or PICC line.   We strive to give you quality time with your provider. You may need to reschedule your appointment if you arrive late (15 or more minutes).  Arriving late affects you and other patients whose appointments are after yours.  Also, if you miss three or more appointments without notifying the office, you may be dismissed from the clinic at the provider's discretion.      For prescription refill requests, have your pharmacy contact our office and allow 72 hours for refills to be completed.    Today you received the following chemotherapy and/or immunotherapy agents Darzalex      To help prevent nausea and vomiting after your treatment, we encourage you to take your nausea medication as directed.  BELOW ARE SYMPTOMS THAT SHOULD BE REPORTED IMMEDIATELY: *FEVER GREATER THAN 100.4 F (38 C) OR HIGHER *CHILLS OR SWEATING *NAUSEA AND VOMITING THAT IS NOT CONTROLLED WITH YOUR NAUSEA MEDICATION *UNUSUAL SHORTNESS OF BREATH *UNUSUAL BRUISING OR BLEEDING *URINARY PROBLEMS (pain or burning when urinating, or frequent urination) *BOWEL PROBLEMS (unusual diarrhea, constipation, pain near the anus) TENDERNESS IN MOUTH AND THROAT WITH OR WITHOUT PRESENCE OF ULCERS (sore throat, sores in mouth, or a toothache) UNUSUAL RASH, SWELLING OR PAIN  UNUSUAL VAGINAL DISCHARGE OR ITCHING   Items with * indicate a potential emergency and should be followed up as soon as possible or go to the Emergency Department if any problems should occur.  Please show the CHEMOTHERAPY ALERT CARD or IMMUNOTHERAPY ALERT CARD at check-in  to the Emergency Department and triage nurse. Should you have questions after your visit or need to cancel or reschedule your appointment, please contact [facility]  [phone] and follow the prompts.  Office hours are 8:00 a.m. to 4:30 p.m. Monday - Friday. Please note that voicemails left after 4:00 p.m. may not be returned until the following business day.  We are closed weekends and major holidays. You have access to a nurse at all times for urgent questions. Please call the main number to the clinic [phone] and follow the prompts.  For any non-urgent questions, you may also contact your provider using MyChart. We now offer e-Visits for anyone 18 and older to request care online for non-urgent symptoms. For details visit [email or web address].   Also download the MyChart app! Go to the app store, search "MyChart", open the app, select [facility], and log in with your MyChart username and password.   

## 2022-11-30 ENCOUNTER — Other Ambulatory Visit: Payer: Self-pay

## 2022-11-30 LAB — KAPPA/LAMBDA LIGHT CHAINS
Kappa free light chain: 4.5 mg/L (ref 3.3–19.4)
Kappa, lambda light chain ratio: 1.45 (ref 0.26–1.65)
Lambda free light chains: 3.1 mg/L — ABNORMAL LOW (ref 5.7–26.3)

## 2022-12-01 LAB — IGG, IGA, IGM
IgA: 13 mg/dL — ABNORMAL LOW (ref 61–437)
IgG (Immunoglobin G), Serum: 272 mg/dL — ABNORMAL LOW (ref 603–1613)
IgM (Immunoglobulin M), Srm: 8 mg/dL — ABNORMAL LOW (ref 15–143)

## 2022-12-02 ENCOUNTER — Other Ambulatory Visit: Payer: Self-pay

## 2022-12-02 LAB — PROTEIN ELECTROPHORESIS, SERUM, WITH REFLEX
A/G Ratio: 1.9 — ABNORMAL HIGH (ref 0.7–1.7)
Albumin ELP: 3.6 g/dL (ref 2.9–4.4)
Alpha-1-Globulin: 0.2 g/dL (ref 0.0–0.4)
Alpha-2-Globulin: 0.8 g/dL (ref 0.4–1.0)
Beta Globulin: 0.7 g/dL (ref 0.7–1.3)
Gamma Globulin: 0.3 g/dL — ABNORMAL LOW (ref 0.4–1.8)
Globulin, Total: 1.9 g/dL — ABNORMAL LOW (ref 2.2–3.9)
Total Protein ELP: 5.5 g/dL — ABNORMAL LOW (ref 6.0–8.5)

## 2022-12-12 ENCOUNTER — Ambulatory Visit (HOSPITAL_COMMUNITY)
Admission: RE | Admit: 2022-12-12 | Discharge: 2022-12-12 | Disposition: A | Discharge status: Home / Self Care | Payer: Medicare Other | Source: Ambulatory Visit | Attending: Hematology & Oncology | Admitting: Hematology & Oncology

## 2022-12-12 DIAGNOSIS — C9000 Multiple myeloma not having achieved remission: Secondary | ICD-10-CM | POA: Insufficient documentation

## 2022-12-12 LAB — GLUCOSE, CAPILLARY: Glucose-Capillary: 98 mg/dL (ref 70–99)

## 2022-12-12 MED ORDER — FLUDEOXYGLUCOSE F - 18 (FDG) INJECTION
10.1000 | Freq: Once | INTRAVENOUS | Status: AC
  Administered 2022-12-12: 10.06 via INTRAVENOUS

## 2022-12-20 NOTE — Progress Notes (Signed)
  Sites of Visceral and Bony Metastatic Disease: PET Scan  12/12/2022 --IMPRESSION: New hypermetabolic lesion in the right femoral head, compatible with active myelomatous involvement. Stable ancillary findings as above.  Past/Anticipated chemotherapy by medical oncology, if any:  Patient to follow up with Dr. [name] on 12/27/22  11/29/2022 --Current Therapy:        ASCT -- [facility] on -01/22/2021 Faspro - q month -- maintenance -- start on 05/13/2021 Zometa 4 mg IV every 3 months-next dose in 01/2023  Radiation therapy to T12, left iliac and left humerus  completed on 10/04/2022 --Past Therapy: Status post surgical repair of left femur-07/14/2020 XRT to left shoulder and left hip Xgeva 120 mg subcu every 3 months-next dose in December 2022  Faspro/Velcade/Revlimid/Decadron -- s/p cycle #2 -- start on 08/06/2020  ( Revlimid is 14 on /14 off)   Pain on a scale of 0-10 is: Reports constant left hip pain. Reports he's currently managing with Tylenol and Tramadol   If Spine Met(s), symptoms, if any, include: Bowel/Bladder retention or incontinence (please describe): Reports having to void several times before feeling like he's fully empty. 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 Walker? Wheelchair?: Currently ambulates with a cane, and reports the hip pain can impact his mobility   SAFETY ISSUES: Prior radiation? Yes  07/28/2020 through 08/10/2020 Site Technique Total Dose (Gy) Dose per Fx (Gy) Completed Fx Beam Energies  Scapula, Left: Chest_Lt 3D 30/30 3 10/10 10X, 15X  Sacro-Iliac Joints: Pelvis 3D 30/30 3 10/10 10X, 15X  Femur Left: Ext_Lt Complex 30/30 3 10/10 10X, 15X    Pacemaker/ICD? No Possible current pregnancy? N/A Is the  patient on methotrexate? No  Current Complaints / other details:  Nothing else of note

## 2022-12-20 NOTE — Progress Notes (Signed)
Radiation Oncology         [phone] ________________________________  Outpatient Re-Consultation  Name: Bruce Smith MRN: [id number]  Date: 12/21/2022  DOB: [birth year]  [id number], Quin Hoop., MD  Volanda Napoleon, MD   REFERRING PHYSICIAN: Volanda Napoleon, MD  DIAGNOSIS: There were no encounter diagnoses.  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    PET scan with new hypermetabolic lesions within the T12 vertebral body, anterior left iliac bone, and left mid humerus consistent with recurrent myelomatous involvement  New hypermetabolic lesion in the right femoral head compatible with active myelomatous involvement (12/12/22 PET)  Interval Since Last Radiation: 2 months and 16 days   Intent: Curative  Radiation Treatment Dates: 09/19/2022 through 10/04/2022 Site Technique Total Dose (Gy) Dose per Fx (Gy) Completed Fx Beam Energies  Humerus, Left: Ext_L Complex 25/25 2.5 10/10 10X  Thoracic Spine: Spine 3D 25/25 2.5 10/10 15X  Ilium, Left: Pelvis_L Complex 25/25 2.5 10/10 15X   HISTORY OF PRESENT ILLNESS::Bruce Smith is a 72 y.o. male who is accompanied by ***. 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 I last met with the patient on 11/03/22 for follow up of radiation therapy to the disease in his left pelvis.   Since that time, the patient followed up with Dr. [name] on 11/29/22 who recommended that the patient begin daratumumab (Darzalex). He also received his Zometa on the date of this visit. The patient was noted to be doing well overall at that time and without any significant concerns.   The patient recently presented for a restaging PET scan on 12/12/22 which demonstrates a new hypermetabolic lesion in the right  femoral head (SUV max of 8.5), compatible with active myelomatous involvement. PET otherwise showed no FDG avidity associated with prior lesions, including the multiple treated lesions in the thoracolumbar spine.     PAST MEDICAL HISTORY:  Past Medical History:  Diagnosis Date   Atherosclerosis    Coronary artery disease    Goals of care, counseling/discussion 06/29/2020   History of radiation therapy    Left Humerus, T-spine, Pelvis- 09/19/22-10/04/22- Dr. 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[place]) 06/29/2020   Multiple myeloma ([place]) 07/21/2020   Myocardial infarction [facility])     PAST SURGICAL HISTORY: Past Surgical History:  Procedure Laterality Date   ANTERIOR CERVICAL DECOMP/DISCECTOMY FUSION N/A 06/17/2020   Procedure: ANTERIOR CERVICAL DECOMPRESSION/DISCECTOMY FUSION, INTERBODY PROSTHESIS, PLATE/SCREWS CERVICAL FIVE- CERVICAL SIX;  Surgeon: Newman Pies, MD;  Location: [place];  Service: Neurosurgery;  Laterality: N/A;  ANTERIOR CERVICAL DECOMPRESSION/DISCECTOMY FUSION, INTERBODY PROSTHESIS, PLATE/SCREWS CERVICAL FIVE- CERVICAL SIX   BIOPSY  02/24/2021   Procedure: BIOPSY;  Surgeon: Otis Brace, MD;  Location: [facility];  Service: Gastroenterology;;   CARDIAC CATHETERIZATION     ESOPHAGOGASTRODUODENOSCOPY N/A 02/24/2021   Procedure: ESOPHAGOGASTRODUODENOSCOPY (EGD);  Surgeon: Otis Brace, MD;  Location: [facility];  Service: Gastroenterology;  Laterality: N/A;   FEMUR IM NAIL Left 07/14/2020   Procedure: OPEN REDUCTION INTERNAL FIXATION (ORIF LEFT FEMORAL NAIL FRACTURE;  Surgeon: Marchia Bond, MD;  Location: [place];  Service: Orthopedics;  Laterality: Left;    FAMILY HISTORY:  Family History  Problem Relation  Age of Onset   Stroke Mother    Prostate cancer Paternal Grandfather     SOCIAL HISTORY:  Social History   Tobacco Use   Smoking status: Former     Packs/day: 1.00    Years: 15.00    Total pack years: 15.00    Types: Cigarettes    Quit date: 10/31/1998    Years since quitting: 24.1   Smokeless tobacco: Current    Types: Chew  Vaping Use   Vaping Use: Never used  Substance Use Topics   Alcohol use: Not Currently    Comment: rare   Drug use: Never    ALLERGIES: No Known Allergies  MEDICATIONS:  Current Outpatient Medications  Medication Sig Dispense Refill   acetaminophen (TYLENOL) 650 MG CR tablet Take 500 mg by mouth in the morning, at noon, in the evening, and at bedtime. Patient states that he is taking 500 mg     acyclovir (ZOVIRAX) 400 MG tablet Take 1 tablet (400 mg total) by mouth 2 (two) times daily. 180 tablet 0   aspirin 81 MG EC tablet Take by mouth daily.     calcium carbonate (OS-CAL - DOSED IN MG OF ELEMENTAL CALCIUM) 1250 (500 Ca) MG tablet Take 1 tablet (500 mg of elemental calcium total) by mouth 2 (two) times daily with a meal. 60 tablet 0   celecoxib (CELEBREX) 100 MG capsule TAKE 1 CAPSULE BY MOUTH 2 TIMES DAILY WITH FOOD AS NEEDED FOR PAIN & INFLAMMATION 180 capsule 0   dexamethasone (DECADRON) 4 MG tablet Take two tablets (8 mg) every eight hours as needed with Zofran for nausea. (Patient not taking: Reported on 11/03/2022) 60 tablet 3   levETIRAcetam (KEPPRA) 500 MG tablet TAKE 1 TABLET BY MOUTH 2 TIMES A DAY 60 tablet 3   loperamide (IMODIUM) 2 MG capsule Take 1 capsule (2 mg total) by mouth as needed for diarrhea or loose stools. 30 capsule 0   metoprolol succinate (TOPROL-XL) 25 MG 24 hr tablet Take 25 mg by mouth daily.     ondansetron (ZOFRAN) 8 MG tablet TAKE 1 TABLET BY MOUTH EVERY 8 HOURS AS NEEDED NAUSEA AND VOMITING 180 tablet 0   pantoprazole (PROTONIX) 40 MG tablet TAKE 1 TABLET BY MOUTH 2 TIMES A DAY BEFORE MEALS FOR ACID REFLUX 180 tablet 0   polyethylene glycol powder (GLYCOLAX/MIRALAX) 17 GM/SCOOP powder Take by mouth daily.     pregabalin (LYRICA) 200 MG capsule TAKE 1 CAPSULE BY MOUTH 2 TIMES  DAILY 180 capsule 0   prochlorperazine (COMPAZINE) 10 MG tablet Take 1 tablet (10 mg total) by mouth every 6 (six) hours as needed for nausea or vomiting. 30 tablet 3   tamsulosin (FLOMAX) 0.4 MG CAPS capsule TAKE 1 CAPSULE BY MOUTH AT BEDTIME 90 capsule 0   traMADol (ULTRAM) 50 MG tablet TAKE TWO TABLETS BY MOUTH EVERY 6 HOURS AS NEEDED FOR PAIN 240 tablet 0   No current facility-administered medications for this encounter.    REVIEW OF SYSTEMS:  A 10+ POINT REVIEW OF SYSTEMS WAS OBTAINED including neurology, dermatology, psychiatry, cardiac, respiratory, lymph, extremities, GI, GU, musculoskeletal, constitutional, reproductive, HEENT. ***   PHYSICAL EXAM:  vitals were not taken for this visit.   General: Alert and oriented, in no acute distress HEENT: Head is normocephalic. Extraocular movements are intact. Oropharynx is clear. Neck: Neck is supple, no palpable cervical or supraclavicular lymphadenopathy. Heart: Regular in rate and rhythm with no murmurs, rubs, or gallops. Chest: Clear to auscultation bilaterally, with no  rhonchi, wheezes, or rales. Abdomen: Soft, nontender, nondistended, with no rigidity or guarding. Extremities: No cyanosis or edema. Lymphatics: see Neck Exam Skin: No concerning lesions. Musculoskeletal: symmetric strength and muscle tone throughout. Neurologic: Cranial nerves II through XII are grossly intact. No obvious focalities. Speech is fluent. Coordination is intact. Psychiatric: Judgment and insight are intact. Affect is appropriate. ***  ECOG = ***  0 - Asymptomatic (Fully active, able to carry on all predisease activities without restriction)  1 - Symptomatic but completely ambulatory (Restricted in physically strenuous activity but ambulatory and able to carry out work of a light or sedentary nature. For example, light housework, office work)  2 - Symptomatic, <50% in bed during the day (Ambulatory and capable of all self care but unable to carry out  any work activities. Up and about more than 50% of waking hours)  3 - Symptomatic, >50% in bed, but not bedbound (Capable of only limited self-care, confined to bed or chair 50% or more of waking hours)  4 - Bedbound (Completely disabled. Cannot carry on any self-care. Totally confined to bed or chair)  5 - Death   Eustace Pen MM, Creech RH, Tormey DC, et al. [phone]). "Toxicity and response criteria of the [facility]". [place] Oncol. 5 (6): 649-55  LABORATORY DATA:  Lab Results  Component Value Date   WBC 4.6 11/29/2022   HGB 13.4 11/29/2022   HCT 40.2 11/29/2022   MCV 98.8 11/29/2022   PLT 161 11/29/2022   NEUTROABS 3.5 11/29/2022   Lab Results  Component Value Date   NA 142 11/29/2022   K 4.0 11/29/2022   CL 107 11/29/2022   CO2 27 11/29/2022   GLUCOSE 95 11/29/2022   BUN 17 11/29/2022   CREATININE 0.92 11/29/2022   CALCIUM 9.1 11/29/2022      RADIOGRAPHY: NM PET Image Restag (PS) Skull Base To Thigh  Result Date: 12/12/2022 CLINICAL DATA:  Subsequent treatment strategy for multiple myeloma. EXAM: NUCLEAR MEDICINE PET SKULL BASE TO THIGH TECHNIQUE: 10.1 mCi F-18 FDG was injected intravenously. Full-ring PET imaging was performed from the skull base to thigh after the radiotracer. CT data was obtained and used for attenuation correction and anatomic localization. Fasting blood glucose: 98 endo as ago and mg/dl COMPARISON:  None Available. FINDINGS: Mediastinal blood pool activity: SUV max 2.9 Liver activity: SUV max NA NECK: No hypermetabolic cervical lymphadenopathy. Incidental CT findings: None. CHEST: Radiation changes in the left upper lobe. Mild scarring/atelectasis in the left lower lobe. No hypermetabolic thoracic lymphadenopathy. Incidental CT findings: Atherosclerotic calcifications of the aortic arch. Severe 3 vessel coronary atherosclerosis. ABDOMEN/PELVIS: No hypermetabolic abdominopelvic lymphadenopathy. No abnormal hypermetabolism in the liver,  spleen, pancreas, or adrenal glands. Incidental CT findings: Atherosclerotic calcifications of the abdominal aorta and branch vessels. SKELETON: New focal hypermetabolism involving a lytic lesion in the right femoral head, max SUV 8.5. Prior lesions are no longer FDG avid. Multiple treated lesions in the thoracolumbar spine. Stable deformity of the left lateral scapula. Incidental CT findings: Degenerative changes of the thoracolumbar spine. Cervical spine fixation hardware. Status post ORIF of the left hip. Sequela of prior left sacral insufficiency fracture (series 4/image 157). IMPRESSION: New hypermetabolic lesion in the right femoral head, compatible with active myelomatous involvement. Stable ancillary findings as above. Electronically Signed   By: Julian Hy M.D.   On: 12/12/2022 08:32      IMPRESSION: New hypermetabolic lesion in the right femoral head compatible with active myelomatous involvement (12/12/22 PET)  ***  Today, I talked to the patient and family about the findings and work-up thus far.  We discussed the natural history of *** and general treatment, highlighting the role of radiotherapy in the management.  We discussed the available radiation techniques, and focused on the details of logistics and delivery.  We reviewed the anticipated acute and late sequelae associated with radiation in this setting.  The patient was encouraged to ask questions that I answered to the best of my ability. *** A patient consent form was discussed and signed.  We retained a copy for our records.  The patient would like to proceed with radiation and will be scheduled for CT simulation.  PLAN: ***    *** minutes of total time was spent for this patient encounter, including preparation, face-to-face counseling with the patient and coordination of care, physical exam, and documentation of the encounter.   ------------------------------------------------  Blair Promise, PhD, MD  This document  serves as a record of services personally performed by Gery Pray, MD. It was created on his behalf by Roney Mans, a trained medical scribe. The creation of this record is based on the scribe's personal observations and the provider's statements to them. This document has been checked and approved by the attending provider.

## 2022-12-21 ENCOUNTER — Encounter: Payer: Self-pay | Admitting: Radiation Oncology

## 2022-12-21 ENCOUNTER — Ambulatory Visit
Admission: RE | Admit: 2022-12-21 | Discharge: 2022-12-21 | Disposition: A | Discharge status: Home / Self Care | Payer: Medicare Other | Source: Ambulatory Visit | Attending: Radiation Oncology | Admitting: Radiation Oncology

## 2022-12-21 VITALS — BP 147/61 | HR 89 | Temp 96.8°F | Resp 18 | Ht 68.0 in | Wt 203.4 lb

## 2022-12-21 DIAGNOSIS — E785 Hyperlipidemia, unspecified: Secondary | ICD-10-CM | POA: Insufficient documentation

## 2022-12-21 DIAGNOSIS — C9000 Multiple myeloma not having achieved remission: Secondary | ICD-10-CM | POA: Insufficient documentation

## 2022-12-21 DIAGNOSIS — Z923 Personal history of irradiation: Secondary | ICD-10-CM | POA: Diagnosis not present

## 2022-12-21 DIAGNOSIS — Z79899 Other long term (current) drug therapy: Secondary | ICD-10-CM | POA: Insufficient documentation

## 2022-12-21 DIAGNOSIS — Z8042 Family history of malignant neoplasm of prostate: Secondary | ICD-10-CM | POA: Insufficient documentation

## 2022-12-21 DIAGNOSIS — Z87891 Personal history of nicotine dependence: Secondary | ICD-10-CM | POA: Diagnosis not present

## 2022-12-21 DIAGNOSIS — I2510 Atherosclerotic heart disease of native coronary artery without angina pectoris: Secondary | ICD-10-CM | POA: Diagnosis not present

## 2022-12-21 DIAGNOSIS — Z791 Long term (current) use of non-steroidal anti-inflammatories (NSAID): Secondary | ICD-10-CM | POA: Diagnosis not present

## 2022-12-21 DIAGNOSIS — I10 Essential (primary) hypertension: Secondary | ICD-10-CM | POA: Diagnosis not present

## 2022-12-21 DIAGNOSIS — I252 Old myocardial infarction: Secondary | ICD-10-CM | POA: Insufficient documentation

## 2022-12-21 DIAGNOSIS — Z79624 Long term (current) use of inhibitors of nucleotide synthesis: Secondary | ICD-10-CM | POA: Diagnosis not present

## 2022-12-21 DIAGNOSIS — C7951 Secondary malignant neoplasm of bone: Secondary | ICD-10-CM

## 2022-12-22 ENCOUNTER — Other Ambulatory Visit: Payer: Self-pay | Admitting: *Deleted

## 2022-12-22 ENCOUNTER — Ambulatory Visit: Payer: Medicare Other | Admitting: Radiation Oncology

## 2022-12-22 ENCOUNTER — Other Ambulatory Visit: Payer: Self-pay

## 2022-12-22 MED ORDER — TAMSULOSIN HCL 0.4 MG PO CAPS: 0.4000 mg | ORAL_CAPSULE | Freq: Every day | ORAL | 0 refills | Status: DC

## 2022-12-23 ENCOUNTER — Telehealth: Payer: Self-pay | Admitting: *Deleted

## 2022-12-23 ENCOUNTER — Other Ambulatory Visit: Payer: Self-pay

## 2022-12-23 MED ORDER — TRAMADOL HCL 50 MG PO TABS: ORAL_TABLET | ORAL | 0 refills | Status: DC

## 2022-12-23 NOTE — Telephone Encounter (Signed)
Refill request received from [facility] requesting a refill on Tramadol 50 mg that is prescribed to take 2 tablets every 6 hours as needed. Last refilled 10/20/22 #240. Please advise, thanks.

## 2022-12-23 NOTE — Telephone Encounter (Signed)
Received a call from Molli Knock patients wife, inquiring if Dr [name] has spoken to Dr Mylinda Latina and if any changes.  Dr [name] has spoken to Dr Laverta Baltimore and no changes in medications per  Dr [name]

## 2022-12-26 ENCOUNTER — Other Ambulatory Visit: Payer: Self-pay

## 2022-12-26 DIAGNOSIS — C9000 Multiple myeloma not having achieved remission: Secondary | ICD-10-CM

## 2022-12-26 MED ORDER — TRAMADOL HCL 50 MG PO TABS: ORAL_TABLET | ORAL | 0 refills | Status: DC

## 2022-12-27 ENCOUNTER — Inpatient Hospital Stay: Payer: Medicare Other | Attending: Hematology & Oncology

## 2022-12-27 ENCOUNTER — Inpatient Hospital Stay (HOSPITAL_BASED_OUTPATIENT_CLINIC_OR_DEPARTMENT_OTHER): Payer: Medicare Other | Admitting: Hematology & Oncology

## 2022-12-27 ENCOUNTER — Inpatient Hospital Stay: Payer: Medicare Other

## 2022-12-27 ENCOUNTER — Encounter: Payer: Self-pay | Admitting: Hematology & Oncology

## 2022-12-27 DIAGNOSIS — C9000 Multiple myeloma not having achieved remission: Secondary | ICD-10-CM

## 2022-12-27 DIAGNOSIS — Z923 Personal history of irradiation: Secondary | ICD-10-CM | POA: Insufficient documentation

## 2022-12-27 DIAGNOSIS — Z5112 Encounter for antineoplastic immunotherapy: Secondary | ICD-10-CM | POA: Insufficient documentation

## 2022-12-27 LAB — CMP (CANCER CENTER ONLY)
ALT: 17 U/L (ref 0–44)
AST: 14 U/L — ABNORMAL LOW (ref 15–41)
Albumin: 4.3 g/dL (ref 3.5–5.0)
Alkaline Phosphatase: 75 U/L (ref 38–126)
Anion gap: 7 (ref 5–15)
BUN: 15 mg/dL (ref 8–23)
CO2: 27 mmol/L (ref 22–32)
Calcium: 9.8 mg/dL (ref 8.9–10.3)
Chloride: 104 mmol/L (ref 98–111)
Creatinine: 1.06 mg/dL (ref 0.61–1.24)
GFR, Estimated: >60 mL/min (ref >60)
Glucose, Bld: 87 mg/dL (ref 70–99)
Potassium: 4.4 mmol/L (ref 3.5–5.1)
Sodium: 138 mmol/L (ref 135–145)
Total Bilirubin: 0.4 mg/dL (ref 0.3–1.2)
Total Protein: 5.9 g/dL — ABNORMAL LOW (ref 6.5–8.1)

## 2022-12-27 LAB — CBC WITH DIFFERENTIAL (CANCER CENTER ONLY)
Abs Immature Granulocytes: 0.02 10*3/uL (ref 0.00–0.07)
Basophils Absolute: 0 10*3/uL (ref 0.0–0.1)
Basophils Relative: 0 %
Eosinophils Absolute: 0.1 10*3/uL (ref 0.0–0.5)
Eosinophils Relative: 3 %
HCT: 41.6 % (ref 39.0–52.0)
Hemoglobin: 13.8 g/dL (ref 13.0–17.0)
Immature Granulocytes: 0 %
Lymphocytes Relative: 10 %
Lymphs Abs: 0.5 10*3/uL — ABNORMAL LOW (ref 0.7–4.0)
MCH: 32.6 pg (ref 26.0–34.0)
MCHC: 33.2 g/dL (ref 30.0–36.0)
MCV: 98.3 fL (ref 80.0–100.0)
Monocytes Absolute: 0.7 10*3/uL (ref 0.1–1.0)
Monocytes Relative: 15 %
Neutro Abs: 3.3 10*3/uL (ref 1.7–7.7)
Neutrophils Relative %: 72 %
Platelet Count: 174 10*3/uL (ref 150–400)
RBC: 4.23 MIL/uL (ref 4.22–5.81)
RDW: 12.9 % (ref 11.5–15.5)
WBC Count: 4.7 10*3/uL (ref 4.0–10.5)
nRBC: 0 % (ref 0.0–0.2)

## 2022-12-27 LAB — LACTATE DEHYDROGENASE: LDH: 144 U/L (ref 98–192)

## 2022-12-27 MED ORDER — FAMOTIDINE 20 MG PO TABS: 40.0000 mg | ORAL_TABLET | Freq: Once | ORAL | Status: DC

## 2022-12-27 MED ORDER — ACETAMINOPHEN 325 MG PO TABS: 650.0000 mg | ORAL_TABLET | Freq: Once | ORAL | Status: DC

## 2022-12-27 MED ORDER — DIPHENHYDRAMINE HCL 25 MG PO CAPS: 50.0000 mg | ORAL_CAPSULE | Freq: Once | ORAL | Status: DC

## 2022-12-27 MED ORDER — DARATUMUMAB-HYALURONIDASE-FIHJ 1800-30000 MG-UT/15ML ~~LOC~~ SOLN
1800.0000 mg | Freq: Once | SUBCUTANEOUS | Status: AC
  Administered 2022-12-27: 1800 mg via SUBCUTANEOUS
  Filled 2022-12-27: qty 15

## 2022-12-27 NOTE — Progress Notes (Signed)
Hematology and Oncology Follow Up Visit  Bruce Smith [id number] 1951/08/05 72 y.o. 12/27/2022   Principle Diagnosis:  Kappa light chain myeloma-extensive bone disease-pathologic fracture of left scapula and left femur - t(11:14), 13q-, 1p-,1q-  Past Therapy: Status post surgical repair of left femur-07/14/2020 XRT to left shoulder and left hip Xgeva 120 mg subcu every 3 months-next dose in December 2022  Faspro/Velcade/Revlimid/Decadron -- s/p cycle #2 -- start on       08/06/2020  ( Revlimid is 14 on /14 off)    Current Therapy:        ASCT -- [facility] on -01/22/2021 Faspro - q month -- maintenance -- start on 05/13/2021 Zometa 4 mg IV every 3 months-next dose in 01/2023  Radiation therapy to T12, left iliac and left humerus --completed on 10/04/2022 --XRT to right femoral head -start 01/03/2023   Interim History:  [name] is here today with his wife for follow-up we did not do another PET scan on him.  Unfortunate, the PET scan did show that there was a new active lesion.  This was in the right femoral head.  Where he had radiation therapy previously, those areas all seem to be healed.  As such, we will do another cycle of radiation therapy for him.  We will see how this works.  I suspect that if he continues to have activity while on PET scan in the future, we may have to think about getting him over to CAR-T therapy.  I think this might be the best way to try to help minimize this recurrence.  Otherwise, he seems to be doing pretty well.  He is get around fairly well.  He has had no cough or shortness of breath.  He has had no change in bowel or bladder habits.  He has had no nausea or vomiting.  His last Kappa light chain was 0.5 mg/dL.  This is holding steady.  He has had no rashes.  He has had no bleeding.  His appetite has been good.  Overall, I would say that his performance status is probably ECOG 1.   Medications:  Allergies as of 12/27/2022   No Known Allergies       Medication List        Accurate as of December 27, 2022  9:42 AM. If you have any questions, ask your nurse or doctor.          acetaminophen 650 MG CR tablet Commonly known as: TYLENOL Take 650 mg by mouth in the morning, at noon, in the evening, and at bedtime. Patient states that he is taking 500 mg   acyclovir 400 MG tablet Commonly known as: ZOVIRAX Take 1 tablet (400 mg total) by mouth 2 (two) times daily.   calcium carbonate 1250 (500 Ca) MG tablet Commonly known as: OS-CAL - dosed in mg of elemental calcium Take 1 tablet (500 mg of elemental calcium total) by mouth 2 (two) times daily with a meal.   celecoxib 100 MG capsule Commonly known as: CELEBREX TAKE 1 CAPSULE BY MOUTH 2 TIMES DAILY WITH FOOD AS NEEDED FOR PAIN & INFLAMMATION   dexamethasone 4 MG tablet Commonly known as: DECADRON Take two tablets (8 mg) every eight hours as needed with Zofran for nausea.   levETIRAcetam 500 MG tablet Commonly known as: KEPPRA TAKE 1 TABLET BY MOUTH 2 TIMES A DAY   loperamide 2 MG capsule Commonly known as: IMODIUM Take 1 capsule (2 mg total) by mouth as needed for diarrhea  or loose stools.   MAGNESIUM PO 200 mg daily.   metoprolol succinate 25 MG 24 hr tablet Commonly known as: TOPROL-XL Take 25 mg by mouth daily.   ondansetron 8 MG tablet Commonly known as: ZOFRAN TAKE 1 TABLET BY MOUTH EVERY 8 HOURS AS NEEDED NAUSEA AND VOMITING   pantoprazole 40 MG tablet Commonly known as: PROTONIX TAKE 1 TABLET BY MOUTH 2 TIMES A DAY BEFORE MEALS FOR ACID REFLUX   polyethylene glycol powder 17 GM/SCOOP powder Commonly known as: GLYCOLAX/MIRALAX Take by mouth daily.   pregabalin 200 MG capsule Commonly known as: LYRICA TAKE 1 CAPSULE BY MOUTH 2 TIMES DAILY   prochlorperazine 10 MG tablet Commonly known as: COMPAZINE Take 1 tablet (10 mg total) by mouth every 6 (six) hours as needed for nausea or vomiting.   saccharomyces boulardii 250 MG capsule Commonly known as:  FLORASTOR Take by mouth daily.   tamsulosin 0.4 MG Caps capsule Commonly known as: FLOMAX Take 1 capsule (0.4 mg total) by mouth at bedtime.   traMADol 50 MG tablet Commonly known as: ULTRAM TAKE TWO TABLETS BY MOUTH EVERY 6 HOURS AS NEEDED FOR PAIN        Allergies: No Known Allergies  Past Medical History, Surgical history, Social history, and Family History were reviewed and updated.  Review of Systems: Review of Systems  Constitutional: Negative.   HENT: Negative.    Eyes: Negative.   Respiratory: Negative.    Cardiovascular: Negative.   Gastrointestinal: Negative.   Genitourinary: Negative.   Musculoskeletal:  Positive for joint pain.  Skin: Negative.   Neurological: Negative.   Endo/Heme/Allergies: Negative.   Psychiatric/Behavioral: Negative.       Physical Exam:  height is '5\' 8"'$  (1.727 m) and weight is 199 lb 1.3 oz (90.3 kg). His oral temperature is 98.2 F (36.8 C). His blood pressure is 126/53 (abnormal) and his pulse is 56 (abnormal). His respiration is 20 and oxygen saturation is 95%.   Wt Readings from Last 3 Encounters:  12/27/22 199 lb 1.3 oz (90.3 kg)  12/21/22 203 lb 6 oz (92.3 kg)  11/29/22 202 lb (91.6 kg)    Physical Exam Vitals reviewed.  HENT:     Head: Normocephalic and atraumatic.  Eyes:     Pupils: Pupils are equal, round, and reactive to light.  Cardiovascular:     Rate and Rhythm: Normal rate and regular rhythm.     Heart sounds: Normal heart sounds.  Pulmonary:     Effort: Pulmonary effort is normal.     Breath sounds: Normal breath sounds.  Abdominal:     General: Bowel sounds are normal.     Palpations: Abdomen is soft.  Musculoskeletal:        General: No tenderness or deformity. Normal range of motion.     Cervical back: Normal range of motion.     Comments: There is pain to palpation in the left hip area.  This is in the lateral hip area.  He has some decreased range of motion.  Lymphadenopathy:     Cervical: No  cervical adenopathy.  Skin:    General: Skin is warm and dry.     Findings: No erythema or rash.  Neurological:     Mental Status: He is alert and oriented to person, place, and time.  Psychiatric:        Behavior: Behavior normal.        Thought Content: Thought content normal.        Judgment: Judgment  normal.      Lab Results  Component Value Date   WBC 4.7 12/27/2022   HGB 13.8 12/27/2022   HCT 41.6 12/27/2022   MCV 98.3 12/27/2022   PLT 174 12/27/2022   Lab Results  Component Value Date   FERRITIN 938 (H) 02/23/2021   IRON 67 02/23/2021   TIBC 156 (L) 02/23/2021   UIBC 90 (L) 02/23/2021   IRONPCTSAT 43 02/23/2021   Lab Results  Component Value Date   RBC 4.23 12/27/2022   Lab Results  Component Value Date   KPAFRELGTCHN 4.5 11/29/2022   LAMBDASER 3.1 (L) 11/29/2022   KAPLAMBRATIO 1.45 11/29/2022   Lab Results  Component Value Date   IGGSERUM 272 (L) 11/29/2022   IGA 13 (L) 11/29/2022   IGMSERUM 8 (L) 11/29/2022   Lab Results  Component Value Date   TOTALPROTELP 5.5 (L) 11/29/2022   ALBUMINELP 3.6 11/29/2022   A1GS 0.2 11/29/2022   A2GS 0.8 11/29/2022   BETS 0.7 11/29/2022   GAMS 0.3 (L) 11/29/2022   MSPIKE Not Observed 11/29/2022     Chemistry      Component Value Date/Time   NA 142 11/29/2022 0842   K 4.0 11/29/2022 0842   CL 107 11/29/2022 0842   CO2 27 11/29/2022 0842   BUN 17 11/29/2022 0842   CREATININE 0.92 11/29/2022 0842      Component Value Date/Time   CALCIUM 9.1 11/29/2022 0842   ALKPHOS 81 11/29/2022 0842   AST 15 11/29/2022 0842   ALT 24 11/29/2022 0842   BILITOT 0.3 11/29/2022 0842       Impression and Plan: Mr. Ayson is a very pleasant 72 yo gentleman with 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   For right now, we will go ahead with the daratumumab.  I do not think we have to do another PET scan probably until May.  He is not due for Zometa until April.  Will plan  to get him back in 1 month.    Volanda Napoleon, MD 2/27/20249:42 AM

## 2022-12-27 NOTE — Patient Instructions (Signed)
[facility] [place]  Discharge Instructions: Thank you for choosing [facility] to provide your oncology and hematology care.   If you have a lab appointment with the [facility], please go directly to the [facility] and check in at the registration area.  Wear comfortable clothing and clothing appropriate for easy access to any Portacath or PICC line.   We strive to give you quality time with your provider. You may need to reschedule your appointment if you arrive late (15 or more minutes).  Arriving late affects you and other patients whose appointments are after yours.  Also, if you miss three or more appointments without notifying the office, you may be dismissed from the clinic at the provider's discretion.      For prescription refill requests, have your pharmacy contact our office and allow 72 hours for refills to be completed.    Today you received the following chemotherapy and/or immunotherapy agents Darzalex      To help prevent nausea and vomiting after your treatment, we encourage you to take your nausea medication as directed.  BELOW ARE SYMPTOMS THAT SHOULD BE REPORTED IMMEDIATELY: *FEVER GREATER THAN 100.4 F (38 C) OR HIGHER *CHILLS OR SWEATING *NAUSEA AND VOMITING THAT IS NOT CONTROLLED WITH YOUR NAUSEA MEDICATION *UNUSUAL SHORTNESS OF BREATH *UNUSUAL BRUISING OR BLEEDING *URINARY PROBLEMS (pain or burning when urinating, or frequent urination) *BOWEL PROBLEMS (unusual diarrhea, constipation, pain near the anus) TENDERNESS IN MOUTH AND THROAT WITH OR WITHOUT PRESENCE OF ULCERS (sore throat, sores in mouth, or a toothache) UNUSUAL RASH, SWELLING OR PAIN  UNUSUAL VAGINAL DISCHARGE OR ITCHING   Items with * indicate a potential emergency and should be followed up as soon as possible or go to the Emergency Department if any problems should occur.  Please show the CHEMOTHERAPY ALERT CARD or IMMUNOTHERAPY ALERT CARD at check-in  to the Emergency Department and triage nurse. Should you have questions after your visit or need to cancel or reschedule your appointment, please contact [facility]  [phone] and follow the prompts.  Office hours are 8:00 a.m. to 4:30 p.m. Monday - Friday. Please note that voicemails left after 4:00 p.m. may not be returned until the following business day.  We are closed weekends and major holidays. You have access to a nurse at all times for urgent questions. Please call the main number to the clinic [phone] and follow the prompts.  For any non-urgent questions, you may also contact your provider using MyChart. We now offer e-Visits for anyone 43 and older to request care online for non-urgent symptoms. For details visit [email or web address].   Also download the MyChart app! Go to the app store, search "MyChart", open the app, select [facility], and log in with your MyChart username and password.

## 2022-12-27 NOTE — Progress Notes (Signed)
Patient took pre medications at 0845 this AM before his infusion appointment.

## 2022-12-28 ENCOUNTER — Ambulatory Visit
Admission: RE | Admit: 2022-12-28 | Discharge: 2022-12-28 | Disposition: A | Discharge status: Home / Self Care | Payer: Medicare Other | Source: Ambulatory Visit | Attending: Radiation Oncology | Admitting: Radiation Oncology

## 2022-12-28 ENCOUNTER — Other Ambulatory Visit: Payer: Self-pay

## 2022-12-28 DIAGNOSIS — C9000 Multiple myeloma not having achieved remission: Secondary | ICD-10-CM | POA: Diagnosis present

## 2022-12-28 LAB — KAPPA/LAMBDA LIGHT CHAINS
Kappa free light chain: 4.9 mg/L (ref 3.3–19.4)
Kappa, lambda light chain ratio: 1.48 (ref 0.26–1.65)
Lambda free light chains: 3.3 mg/L — ABNORMAL LOW (ref 5.7–26.3)

## 2022-12-28 LAB — IGG, IGA, IGM
IgA: 12 mg/dL — ABNORMAL LOW (ref 61–437)
IgG (Immunoglobin G), Serum: 293 mg/dL — ABNORMAL LOW (ref 603–1613)
IgM (Immunoglobulin M), Srm: 5 mg/dL — ABNORMAL LOW (ref 15–143)

## 2022-12-30 ENCOUNTER — Inpatient Hospital Stay: Payer: Medicare Other | Attending: Hematology & Oncology

## 2022-12-30 DIAGNOSIS — Z5112 Encounter for antineoplastic immunotherapy: Secondary | ICD-10-CM | POA: Diagnosis present

## 2022-12-30 DIAGNOSIS — C9000 Multiple myeloma not having achieved remission: Secondary | ICD-10-CM | POA: Diagnosis present

## 2022-12-30 LAB — PROTEIN ELECTROPHORESIS, SERUM, WITH REFLEX
A/G Ratio: 1.8 — ABNORMAL HIGH (ref 0.7–1.7)
Albumin ELP: 3.6 g/dL (ref 2.9–4.4)
Alpha-1-Globulin: 0.2 g/dL (ref 0.0–0.4)
Alpha-2-Globulin: 0.8 g/dL (ref 0.4–1.0)
Beta Globulin: 0.8 g/dL (ref 0.7–1.3)
Gamma Globulin: 0.3 g/dL — ABNORMAL LOW (ref 0.4–1.8)
Globulin, Total: 2 g/dL — ABNORMAL LOW (ref 2.2–3.9)
Total Protein ELP: 5.6 g/dL — ABNORMAL LOW (ref 6.0–8.5)

## 2023-01-03 DIAGNOSIS — C9000 Multiple myeloma not having achieved remission: Secondary | ICD-10-CM | POA: Insufficient documentation

## 2023-01-03 LAB — UPEP/UIFE/LIGHT CHAINS/TP, 24-HR UR
% BETA, Urine: 0 %
ALPHA 1 URINE: 0 %
Albumin, U: 100 %
Alpha 2, Urine: 0 %
Free Kappa Lt Chains,Ur: 2.03 mg/L (ref 1.17–86.46)
Free Kappa/Lambda Ratio: >2.94 (ref 1.83–14.26)
Free Lambda Lt Chains,Ur: <0.69 mg/L (ref 0.27–15.21)
GAMMA GLOBULIN URINE: 0 %
Total Protein, Urine-Ur/day: 88 mg/24 hr (ref 30–150)
Total Protein, Urine: 6.5 mg/dL
Total Volume: 1350

## 2023-01-05 ENCOUNTER — Ambulatory Visit
Admission: RE | Admit: 2023-01-05 | Discharge: 2023-01-05 | Disposition: A | Discharge status: Home / Self Care | Payer: Medicare Other | Source: Ambulatory Visit | Attending: Radiation Oncology | Admitting: Radiation Oncology

## 2023-01-05 ENCOUNTER — Other Ambulatory Visit: Payer: Self-pay

## 2023-01-05 ENCOUNTER — Other Ambulatory Visit: Payer: Self-pay | Admitting: *Deleted

## 2023-01-05 DIAGNOSIS — C9000 Multiple myeloma not having achieved remission: Secondary | ICD-10-CM

## 2023-01-05 LAB — RAD ONC ARIA SESSION SUMMARY
Course Elapsed Days: 0
Plan Fractions Treated to Date: 1
Plan Prescribed Dose Per Fraction: 3 Gy
Plan Total Fractions Prescribed: 8
Plan Total Prescribed Dose: 24 Gy
Reference Point Dosage Given to Date: 3 Gy
Reference Point Session Dosage Given: 3 Gy
Session Number: 1

## 2023-01-05 MED ORDER — PREGABALIN 200 MG PO CAPS: 200.0000 mg | ORAL_CAPSULE | Freq: Two times a day (BID) | ORAL | 0 refills | Status: DC

## 2023-01-06 ENCOUNTER — Ambulatory Visit
Admission: RE | Admit: 2023-01-06 | Discharge: 2023-01-06 | Disposition: A | Discharge status: Home / Self Care | Payer: Medicare Other | Source: Ambulatory Visit | Attending: Radiation Oncology | Admitting: Radiation Oncology

## 2023-01-06 ENCOUNTER — Other Ambulatory Visit: Payer: Self-pay

## 2023-01-06 DIAGNOSIS — C9000 Multiple myeloma not having achieved remission: Secondary | ICD-10-CM | POA: Diagnosis not present

## 2023-01-06 LAB — RAD ONC ARIA SESSION SUMMARY
Course Elapsed Days: 1
Plan Fractions Treated to Date: 2
Plan Prescribed Dose Per Fraction: 3 Gy
Plan Total Fractions Prescribed: 8
Plan Total Prescribed Dose: 24 Gy
Reference Point Dosage Given to Date: 6 Gy
Reference Point Session Dosage Given: 3 Gy
Session Number: 2

## 2023-01-09 ENCOUNTER — Other Ambulatory Visit: Payer: Self-pay

## 2023-01-09 ENCOUNTER — Ambulatory Visit
Admission: RE | Admit: 2023-01-09 | Discharge: 2023-01-09 | Disposition: A | Discharge status: Home / Self Care | Payer: Medicare Other | Source: Ambulatory Visit | Attending: Radiation Oncology | Admitting: Radiation Oncology

## 2023-01-09 DIAGNOSIS — C9000 Multiple myeloma not having achieved remission: Secondary | ICD-10-CM | POA: Diagnosis not present

## 2023-01-09 LAB — RAD ONC ARIA SESSION SUMMARY
Course Elapsed Days: 4
Plan Fractions Treated to Date: 3
Plan Prescribed Dose Per Fraction: 3 Gy
Plan Total Fractions Prescribed: 8
Plan Total Prescribed Dose: 24 Gy
Reference Point Dosage Given to Date: 9 Gy
Reference Point Session Dosage Given: 3 Gy
Session Number: 3

## 2023-01-10 ENCOUNTER — Other Ambulatory Visit: Payer: Self-pay

## 2023-01-10 ENCOUNTER — Ambulatory Visit
Admission: RE | Admit: 2023-01-10 | Discharge: 2023-01-10 | Disposition: A | Discharge status: Home / Self Care | Payer: Medicare Other | Source: Ambulatory Visit | Attending: Radiation Oncology | Admitting: Radiation Oncology

## 2023-01-10 ENCOUNTER — Other Ambulatory Visit: Payer: Self-pay | Admitting: Hematology & Oncology

## 2023-01-10 DIAGNOSIS — R627 Adult failure to thrive: Secondary | ICD-10-CM

## 2023-01-10 DIAGNOSIS — C9000 Multiple myeloma not having achieved remission: Secondary | ICD-10-CM | POA: Diagnosis not present

## 2023-01-10 DIAGNOSIS — E440 Moderate protein-calorie malnutrition: Secondary | ICD-10-CM

## 2023-01-10 LAB — RAD ONC ARIA SESSION SUMMARY
Course Elapsed Days: 5
Plan Fractions Treated to Date: 4
Plan Prescribed Dose Per Fraction: 3 Gy
Plan Total Fractions Prescribed: 8
Plan Total Prescribed Dose: 24 Gy
Reference Point Dosage Given to Date: 12 Gy
Reference Point Session Dosage Given: 3 Gy
Session Number: 4

## 2023-01-11 ENCOUNTER — Other Ambulatory Visit: Payer: Self-pay

## 2023-01-11 ENCOUNTER — Ambulatory Visit
Admission: RE | Admit: 2023-01-11 | Discharge: 2023-01-11 | Disposition: A | Discharge status: Home / Self Care | Payer: Medicare Other | Source: Ambulatory Visit | Attending: Radiation Oncology | Admitting: Radiation Oncology

## 2023-01-11 ENCOUNTER — Other Ambulatory Visit: Payer: Self-pay | Admitting: Hematology & Oncology

## 2023-01-11 DIAGNOSIS — C9000 Multiple myeloma not having achieved remission: Secondary | ICD-10-CM | POA: Diagnosis not present

## 2023-01-11 LAB — RAD ONC ARIA SESSION SUMMARY
Course Elapsed Days: 6
Plan Fractions Treated to Date: 5
Plan Prescribed Dose Per Fraction: 3 Gy
Plan Total Fractions Prescribed: 8
Plan Total Prescribed Dose: 24 Gy
Reference Point Dosage Given to Date: 15 Gy
Reference Point Session Dosage Given: 3 Gy
Session Number: 5

## 2023-01-12 ENCOUNTER — Other Ambulatory Visit: Payer: Self-pay

## 2023-01-12 ENCOUNTER — Ambulatory Visit
Admission: RE | Admit: 2023-01-12 | Discharge: 2023-01-12 | Disposition: A | Discharge status: Home / Self Care | Payer: Medicare Other | Source: Ambulatory Visit | Attending: Radiation Oncology | Admitting: Radiation Oncology

## 2023-01-12 DIAGNOSIS — C9000 Multiple myeloma not having achieved remission: Secondary | ICD-10-CM | POA: Diagnosis not present

## 2023-01-12 LAB — RAD ONC ARIA SESSION SUMMARY
Course Elapsed Days: 7
Plan Fractions Treated to Date: 6
Plan Prescribed Dose Per Fraction: 3 Gy
Plan Total Fractions Prescribed: 8
Plan Total Prescribed Dose: 24 Gy
Reference Point Dosage Given to Date: 18 Gy
Reference Point Session Dosage Given: 3 Gy
Session Number: 6

## 2023-01-13 ENCOUNTER — Other Ambulatory Visit: Payer: Self-pay

## 2023-01-13 ENCOUNTER — Ambulatory Visit
Admission: RE | Admit: 2023-01-13 | Discharge: 2023-01-13 | Disposition: A | Discharge status: Home / Self Care | Payer: Medicare Other | Source: Ambulatory Visit | Attending: Radiation Oncology | Admitting: Radiation Oncology

## 2023-01-13 DIAGNOSIS — C9000 Multiple myeloma not having achieved remission: Secondary | ICD-10-CM | POA: Diagnosis not present

## 2023-01-13 LAB — RAD ONC ARIA SESSION SUMMARY
Course Elapsed Days: 8
Plan Fractions Treated to Date: 7
Plan Prescribed Dose Per Fraction: 3 Gy
Plan Total Fractions Prescribed: 8
Plan Total Prescribed Dose: 24 Gy
Reference Point Dosage Given to Date: 21 Gy
Reference Point Session Dosage Given: 3 Gy
Session Number: 7

## 2023-01-16 ENCOUNTER — Other Ambulatory Visit: Payer: Self-pay

## 2023-01-16 ENCOUNTER — Ambulatory Visit
Admission: RE | Admit: 2023-01-16 | Discharge: 2023-01-16 | Disposition: A | Discharge status: Home / Self Care | Payer: Medicare Other | Source: Ambulatory Visit | Attending: Radiation Oncology | Admitting: Radiation Oncology

## 2023-01-16 ENCOUNTER — Ambulatory Visit: Payer: Medicare Other

## 2023-01-16 DIAGNOSIS — C9000 Multiple myeloma not having achieved remission: Secondary | ICD-10-CM | POA: Diagnosis not present

## 2023-01-16 LAB — RAD ONC ARIA SESSION SUMMARY
Course Elapsed Days: 11
Plan Fractions Treated to Date: 8
Plan Prescribed Dose Per Fraction: 3 Gy
Plan Total Fractions Prescribed: 8
Plan Total Prescribed Dose: 24 Gy
Reference Point Dosage Given to Date: 24 Gy
Reference Point Session Dosage Given: 3 Gy
Session Number: 8

## 2023-01-16 NOTE — Radiation Completion Notes (Signed)
Patient Name: Bruce Smith, Bruce Smith MRN: [id number] Date of Birth: 1951/06/20 Referring Physician: Burney Gauze, M.D. Date of Service: 2023-01-16 Radiation Oncologist: Teryl Lucy, M.D. [place] END OF TREATMENT NOTE     Diagnosis: C90.00 Multiple myeloma not having achieved remission Intent: Palliative     ==========DELIVERED PLANS==========  First Treatment Date: 2023-01-05 - Last Treatment Date: 2023-01-16   Plan Name: Pelvis_R Site: Femur Right Technique: Isodose Plan Mode: Photon Dose Per Fraction: 3 Gy Prescribed Dose (Delivered / Prescribed): 24 Gy / 24 Gy Prescribed Fxs (Delivered / Prescribed): 8 / 8     ==========ON TREATMENT VISIT DATES========== 2023-01-10, 2023-01-16     ==========UPCOMING VISITS==========       ==========APPENDIX - ON TREATMENT VISIT NOTES==========   See weekly On Treatment Notes is Epic for details.

## 2023-01-17 ENCOUNTER — Ambulatory Visit: Payer: Medicare Other

## 2023-01-18 ENCOUNTER — Ambulatory Visit: Payer: Medicare Other

## 2023-01-25 ENCOUNTER — Encounter: Payer: Self-pay | Admitting: Hematology & Oncology

## 2023-01-25 ENCOUNTER — Inpatient Hospital Stay: Payer: Medicare Other | Admitting: Hematology & Oncology

## 2023-01-25 ENCOUNTER — Inpatient Hospital Stay: Payer: Medicare Other

## 2023-01-25 DIAGNOSIS — C9000 Multiple myeloma not having achieved remission: Secondary | ICD-10-CM

## 2023-01-25 DIAGNOSIS — Z5112 Encounter for antineoplastic immunotherapy: Secondary | ICD-10-CM | POA: Diagnosis not present

## 2023-01-25 MED ORDER — ACETAMINOPHEN 325 MG PO TABS: 650.0000 mg | ORAL_TABLET | Freq: Once | ORAL | Status: DC

## 2023-01-25 MED ORDER — DARATUMUMAB-HYALURONIDASE-FIHJ 1800-30000 MG-UT/15ML ~~LOC~~ SOLN
1800.0000 mg | Freq: Once | SUBCUTANEOUS | Status: AC
  Administered 2023-01-25: 1800 mg via SUBCUTANEOUS
  Filled 2023-01-25: qty 15

## 2023-01-25 MED ORDER — FAMOTIDINE 20 MG PO TABS: 40.0000 mg | ORAL_TABLET | Freq: Once | ORAL | Status: DC

## 2023-01-25 MED ORDER — DIPHENHYDRAMINE HCL 25 MG PO CAPS: 50.0000 mg | ORAL_CAPSULE | Freq: Once | ORAL | Status: DC

## 2023-01-25 NOTE — Progress Notes (Signed)
Hematology and Oncology Follow Up Visit  Dannie Barresi [id number] 06-24-1951 72 y.o. 01/25/2023   Principle Diagnosis:  Kappa light chain myeloma-extensive bone disease-pathologic fracture of left scapula and left femur - t(11:14), 13q-, 1p-,1q-  Past Therapy: Status post surgical repair of left femur-07/14/2020 XRT to left shoulder and left hip Xgeva 120 mg subcu every 3 months-next dose in December 2022  Faspro/Velcade/Revlimid/Decadron -- s/p cycle #2 -- start on       08/06/2020  ( Revlimid is 14 on /14 off)    Current Therapy:        ASCT -- [facility] on -01/22/2021 Faspro - q month -- maintenance -- start on 05/13/2021 Zometa 4 mg IV every 3 months-next dose in 01/2023  Radiation therapy to T12, left iliac and left humerus --completed on 10/04/2022 --XRT to right femoral head -start 01/03/2023   Interim History:  Mr. Dicker is here today with his wife for follow-up.  He is doing okay.  He was out at [facility] to see Dr. Julio Alm.  Everything looked okay from their perspective.  He had lab work done at [facility].  I do not see anything on the lab work that looked suspicious.  His IgG level is little bit on the lower side.  However, he has not had no problems with infections so we do not have to give any supplemental IVIG.  We did do a 24-hour urine on him back on 12/30/2022.  There is no evidence of abnormal light chain in the urine.  He completed radiotherapy to the right hip 10 days ago.  As such, we will have to wait to do a PET scan probably sometime in June.  He has had no problems with cough or shortness of breath.  He has had no nausea or vomiting.  He has some diarrhea with the radiation therapy.  Hopefully, this will ease up.  He has had no rashes.  There is been no bleeding.  Overall, I would say that his performance status is probably ECOG 1.  .   Medications:  Allergies as of 01/25/2023   No Known Allergies      Medication List        Accurate as of January 25, 2023  9:16 AM. If you  have any questions, ask your nurse or doctor.          acetaminophen 650 MG CR tablet Commonly known as: TYLENOL Take 650 mg by mouth in the morning, at noon, in the evening, and at bedtime. Patient states that he is taking 500 mg   acyclovir 400 MG tablet Commonly known as: ZOVIRAX Take 1 tablet (400 mg total) by mouth 2 (two) times daily.   calcium carbonate 1250 (500 Ca) MG tablet Commonly known as: OS-CAL - dosed in mg of elemental calcium Take 1 tablet (500 mg of elemental calcium total) by mouth 2 (two) times daily with a meal.   celecoxib 100 MG capsule Commonly known as: CELEBREX TAKE 1 CAPSULE BY MOUTH 2 TIMES DAILY WITH FOOD AS NEEDED FOR PAIN & INFLAMMATION   dexamethasone 4 MG tablet Commonly known as: DECADRON Take two tablets (8 mg) every eight hours as needed with Zofran for nausea.   levETIRAcetam 500 MG tablet Commonly known as: KEPPRA TAKE 1 TABLET BY MOUTH 2 TIMES A DAY   loperamide 2 MG capsule Commonly known as: IMODIUM Take 1 capsule (2 mg total) by mouth as needed for diarrhea or loose stools.   MAGNESIUM PO 200 mg daily.  metoprolol succinate 25 MG 24 hr tablet Commonly known as: TOPROL-XL Take 25 mg by mouth daily.   ondansetron 8 MG tablet Commonly known as: ZOFRAN TAKE 1 TABLET BY MOUTH EVERY 8 HOURS AS NEEDED NAUSEA AND VOMITING   pantoprazole 40 MG tablet Commonly known as: PROTONIX TAKE 1 TABLET BY MOUTH 2 TIMES A DAY BEFORE MEALS FOR ACID REFLUX   polyethylene glycol powder 17 GM/SCOOP powder Commonly known as: GLYCOLAX/MIRALAX Take by mouth daily.   pregabalin 200 MG capsule Commonly known as: LYRICA TAKE 1 CAPSULE BY MOUTH 2 TIMES DAILY   prochlorperazine 10 MG tablet Commonly known as: COMPAZINE Take 1 tablet (10 mg total) by mouth every 6 (six) hours as needed for nausea or vomiting.   saccharomyces boulardii 250 MG capsule Commonly known as: FLORASTOR Take by mouth daily.   tamsulosin 0.4 MG Caps capsule Commonly  known as: FLOMAX Take 1 capsule (0.4 mg total) by mouth at bedtime.   traMADol 50 MG tablet Commonly known as: ULTRAM TAKE TWO TABLETS BY MOUTH EVERY 6 HOURS AS NEEDED FOR PAIN        Allergies: No Known Allergies  Past Medical History, Surgical history, Social history, and Family History were reviewed and updated.  Review of Systems: Review of Systems  Constitutional: Negative.   HENT: Negative.    Eyes: Negative.   Respiratory: Negative.    Cardiovascular: Negative.   Gastrointestinal: Negative.   Genitourinary: Negative.   Musculoskeletal:  Positive for joint pain.  Skin: Negative.   Neurological: Negative.   Endo/Heme/Allergies: Negative.   Psychiatric/Behavioral: Negative.       Physical Exam:  weight is 200 lb 0.6 oz (90.7 kg). His oral temperature is 98.3 F (36.8 C). His blood pressure is 129/56 (abnormal) and his pulse is 51 (abnormal). His respiration is 17 and oxygen saturation is 100%.   Wt Readings from Last 3 Encounters:  01/25/23 200 lb 0.6 oz (90.7 kg)  12/27/22 199 lb 1.3 oz (90.3 kg)  12/21/22 203 lb 6 oz (92.3 kg)    Physical Exam Vitals reviewed.  HENT:     Head: Normocephalic and atraumatic.  Eyes:     Pupils: Pupils are equal, round, and reactive to light.  Cardiovascular:     Rate and Rhythm: Normal rate and regular rhythm.     Heart sounds: Normal heart sounds.  Pulmonary:     Effort: Pulmonary effort is normal.     Breath sounds: Normal breath sounds.  Abdominal:     General: Bowel sounds are normal.     Palpations: Abdomen is soft.  Musculoskeletal:        General: No tenderness or deformity. Normal range of motion.     Cervical back: Normal range of motion.     Comments: There is pain to palpation in the left hip area.  This is in the lateral hip area.  He has some decreased range of motion.  Lymphadenopathy:     Cervical: No cervical adenopathy.  Skin:    General: Skin is warm and dry.     Findings: No erythema or rash.   Neurological:     Mental Status: He is alert and oriented to person, place, and time.  Psychiatric:        Behavior: Behavior normal.        Thought Content: Thought content normal.        Judgment: Judgment normal.     Lab Results  Component Value Date   WBC 4.7 12/27/2022  HGB 13.8 12/27/2022   HCT 41.6 12/27/2022   MCV 98.3 12/27/2022   PLT 174 12/27/2022   Lab Results  Component Value Date   FERRITIN 938 (H) 02/23/2021   IRON 67 02/23/2021   TIBC 156 (L) 02/23/2021   UIBC 90 (L) 02/23/2021   IRONPCTSAT 43 02/23/2021   Lab Results  Component Value Date   RBC 4.23 12/27/2022   Lab Results  Component Value Date   KPAFRELGTCHN 4.9 12/27/2022   LAMBDASER 3.3 (L) 12/27/2022   KAPLAMBRATIO >2.94 12/30/2022   Lab Results  Component Value Date   IGGSERUM 293 (L) 12/27/2022   IGA 12 (L) 12/27/2022   IGMSERUM 5 (L) 12/27/2022   Lab Results  Component Value Date   TOTALPROTELP 5.6 (L) 12/27/2022   ALBUMINELP 3.6 12/27/2022   A1GS 0.2 12/27/2022   A2GS 0.8 12/27/2022   BETS 0.8 12/27/2022   GAMS 0.3 (L) 12/27/2022   MSPIKE Not Observed 12/27/2022     Chemistry      Component Value Date/Time   NA 138 12/27/2022 0900   K 4.4 12/27/2022 0900   CL 104 12/27/2022 0900   CO2 27 12/27/2022 0900   BUN 15 12/27/2022 0900   CREATININE 1.06 12/27/2022 0900      Component Value Date/Time   CALCIUM 9.8 12/27/2022 0900   ALKPHOS 75 12/27/2022 0900   AST 14 (L) 12/27/2022 0900   ALT 17 12/27/2022 0900   BILITOT 0.4 12/27/2022 0900       Impression and Plan: Mr. Hamson is a very pleasant 72 yo gentleman with 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   Of note, his last bone marrow test that we did on him was back in November 2023.  We will continue him on the daratumumab.  When he comes back in April, he will get a dose of Zometa.  I do not think we have to do any additional studies (i.e. bone marrow biopsy or PET  scan) probably until June.  Will plan to get him back in 1 month.    Volanda Napoleon, MD 3/27/20249:16 AM

## 2023-01-25 NOTE — Progress Notes (Signed)
Ok to treat with 3/25 labs from [facility] per Dr [name]. dph

## 2023-01-25 NOTE — Patient Instructions (Signed)
[facility]  Discharge Instructions: Thank you for choosing [facility] to provide your oncology and hematology care.   If you have a lab appointment with the Cancer Center, please go directly to the Cancer Center and check in at the registration area.  Wear comfortable clothing and clothing appropriate for easy access to any Portacath or PICC line.   We strive to give you quality time with your provider. You may need to reschedule your appointment if you arrive late (15 or more minutes).  Arriving late affects you and other patients whose appointments are after yours.  Also, if you miss three or more appointments without notifying the office, you may be dismissed from the clinic at the provider's discretion.      For prescription refill requests, have your pharmacy contact our office and allow 72 hours for refills to be completed.    Today you received the following chemotherapy and/or immunotherapy agents Darzalex      To help prevent nausea and vomiting after your treatment, we encourage you to take your nausea medication as directed.  BELOW ARE SYMPTOMS THAT SHOULD BE REPORTED IMMEDIATELY: *FEVER GREATER THAN 100.4 F (38 C) OR HIGHER *CHILLS OR SWEATING *NAUSEA AND VOMITING THAT IS NOT CONTROLLED WITH YOUR NAUSEA MEDICATION *UNUSUAL SHORTNESS OF BREATH *UNUSUAL BRUISING OR BLEEDING *URINARY PROBLEMS (pain or burning when urinating, or frequent urination) *BOWEL PROBLEMS (unusual diarrhea, constipation, pain near the anus) TENDERNESS IN MOUTH AND THROAT WITH OR WITHOUT PRESENCE OF ULCERS (sore throat, sores in mouth, or a toothache) UNUSUAL RASH, SWELLING OR PAIN  UNUSUAL VAGINAL DISCHARGE OR ITCHING   Items with * indicate a potential emergency and should be followed up as soon as possible or go to the Emergency Department if any problems should occur.  Please show the CHEMOTHERAPY ALERT CARD or IMMUNOTHERAPY ALERT CARD at check-in  to the Emergency Department and triage nurse. Should you have questions after your visit or need to cancel or reschedule your appointment, please contact [facility]  [phone] and follow the prompts.  Office hours are 8:00 a.m. to 4:30 p.m. Monday - Friday. Please note that voicemails left after 4:00 p.m. may not be returned until the following business day.  We are closed weekends and major holidays. You have access to a nurse at all times for urgent questions. Please call the main number to the clinic [phone] and follow the prompts.  For any non-urgent questions, you may also contact your provider using MyChart. We now offer e-Visits for anyone 18 and older to request care online for non-urgent symptoms. For details visit [email or web address].   Also download the MyChart app! Go to the app store, search "MyChart", open the app, select [facility], and log in with your MyChart username and password.   

## 2023-01-31 ENCOUNTER — Other Ambulatory Visit: Payer: Self-pay

## 2023-02-07 ENCOUNTER — Other Ambulatory Visit: Payer: Self-pay | Admitting: Hematology & Oncology

## 2023-02-15 ENCOUNTER — Encounter: Payer: Self-pay | Admitting: Radiation Oncology

## 2023-02-15 NOTE — Progress Notes (Signed)
  Radiation Oncology         [phone] ________________________________  Patient Name: Bruce Smith MRN: [id number] DOB: Jun 03, 1951 Referring Physician: Arlan Organ (Profile Not Attached) Date of Service: 01/16/2023 [facility], [place]                                                        End Of Treatment Note  Diagnoses: C90.00-Multiple myeloma not having achieved remission  Cancer Staging: The primary encounter diagnosis was Malignant neoplasm metastatic to bone [facility]). A diagnosis of Kappa light chain myeloma (HCC) was also pertinent to this visit.   New hypermetabolic lesion in the right femoral head compatible with active myelomatous involvement (12/12/22 PET)  Intent: Palliative  Radiation Treatment Dates: 01/05/2023 through 01/16/2023 Site Technique Total Dose (Gy) Dose per Fx (Gy) Completed Fx Beam Energies  Femur Right: Ext_R 3D 24/24 3 8/8 10X   Narrative: The patient tolerated radiation therapy relatively well overall. On the date of his final treatment, the patient reported pain rated at a 5/10 to the left pelvis and fatigue. He denied any other side effects or concerns.   Plan: The patient will follow-up with radiation oncology in one month .  ________________________________________________ -----------------------------------  Billie Lade, PhD, MD  This document serves as a record of services personally performed by Antony Blackbird, MD. It was created on his behalf by Neena Rhymes, a trained medical scribe. The creation of this record is based on the scribe's personal observations and the provider's statements to them. This document has been checked and approved by the attending provider.

## 2023-02-16 ENCOUNTER — Ambulatory Visit
Admission: RE | Admit: 2023-02-16 | Discharge: 2023-02-16 | Disposition: A | Discharge status: Home / Self Care | Payer: Medicare Other | Source: Ambulatory Visit | Attending: Radiation Oncology | Admitting: Radiation Oncology

## 2023-02-16 VITALS — BP 126/55 | HR 57 | Temp 97.6°F | Resp 20 | Wt 202.4 lb

## 2023-02-16 DIAGNOSIS — C7951 Secondary malignant neoplasm of bone: Secondary | ICD-10-CM

## 2023-02-16 DIAGNOSIS — C9000 Multiple myeloma not having achieved remission: Secondary | ICD-10-CM | POA: Diagnosis not present

## 2023-02-16 DIAGNOSIS — Z923 Personal history of irradiation: Secondary | ICD-10-CM | POA: Diagnosis not present

## 2023-02-16 DIAGNOSIS — R197 Diarrhea, unspecified: Secondary | ICD-10-CM | POA: Insufficient documentation

## 2023-02-16 NOTE — Progress Notes (Signed)
Mr. Bonton presents today for follow-up after completing radiation to his right femur on 01/16/2023  Pain: Reports a stabbing pain (10 out of 10) to his right lower back that occurs frequently sitting and with movements like bending) Mobility: Able to ambulate with a cane. Denies any balance concerns Bowel/Bladder: Denies any urinary changes, and feels he is able to fully empty his bladder Experienced diarrhea during radiation treatment which continued for a couple weeks after. Reports it has resolved and he is now having regular bowel movements.  Appetite: Reports occasional nausea (manages with PRN zofran), but otherwise reports a healthy appetite  Wt Readings from Last 3 Encounters:  02/16/23 202 lb 6.4 oz (91.8 kg)  01/25/23 200 lb 0.6 oz (90.7 kg)  12/27/22 199 lb 1.3 oz (90.3 kg)   Other issues of note: Other than back pain, reports he's doing well. Scheduled to see Dr. Myna Hidalgo on 02/22/2023

## 2023-02-16 NOTE — Progress Notes (Signed)
Radiation Oncology         [phone] ________________________________  Name: Bruce Smith MRN: [id number]  Date: 02/16/2023  DOB: August 18, 1951  Follow-Up Visit Note  CC: Elijio Miles., MD  Josph Macho, MD    ICD-10-CM   1. Malignant neoplasm metastatic to bone  C79.51 MR LUMBAR SPINE W WO CONTRAST    2. Kappa light chain myeloma  C90.00       Diagnosis: The primary encounter diagnosis was Malignant neoplasm metastatic to bone (HCC). A diagnosis of Kappa light chain myeloma (HCC) was also pertinent to this visit.   New hypermetabolic lesion in the right femoral head compatible with active myelomatous involvement (12/12/22 PET)   Interval Since Last Radiation: 1 month   Intent: Palliative  Radiation Treatment Dates: 01/05/2023 through 01/16/2023 Site Technique Total Dose (Gy) Dose per Fx (Gy) Completed Fx Beam Energies  Femur Right: Ext_R 3D 24/24 3 8/8 10X   Narrative:  The patient returns today for routine follow-up. The patient tolerated radiation therapy relatively well overall. On the date of his final treatment, the patient reported pain rated at a 5/10 to the left pelvis and fatigue. He denied any other side effects or concerns.   Since completing XRT, the patient followed up with Dr. Jacqulyn Bath at the [facility] on 01/23/23. During which time, the patient denied any new concerns other than some loose stools since completing radiation therapy (improving). Moving forward, Dr. Jacqulyn Bath (and Dr. Myna Hidalgo) recommends repeat PET imaging in 3 months. At the time of repeat imaging, he may also have repeat bone marrow aspirate and biopsies. If biopsy and/or PET results show evidence of disease progression in his marrow, he may be started on a different systemic therapy.             The patient also recently followed up with Dr. Myna Hidalgo on 01/25/23. Dr. Myna Hidalgo would like him to continue on Daratumumab. He will also receive his Zometa later this month.         Patient states reports that his diarrhea that he experienced during radiation treatment has continued to improve. He is now having regular bowel movements. He is ambulating with a cane, but denies any issues with balance. He does report a stabbing pain (10 out of 10) to his right lower back that occurs frequently with sitting and movements like bending. Patient states he takes Tylenol and Tramadol for the pain, but it does not alleviate it.  He denies any numbness or weakness of right lower extremity.  Allergies:  has No Known Allergies.  Meds: Current Outpatient Medications  Medication Sig Dispense Refill   acetaminophen (TYLENOL) 650 MG CR tablet Take 650 mg by mouth in the morning, at noon, in the evening, and at bedtime. Patient states that he is taking 500 mg     acyclovir (ZOVIRAX) 400 MG tablet Take 1 tablet (400 mg total) by mouth 2 (two) times daily. (Patient not taking: Reported on 01/25/2023) 180 tablet 0   calcium carbonate (OS-CAL - DOSED IN MG OF ELEMENTAL CALCIUM) 1250 (500 Ca) MG tablet Take 1 tablet (500 mg of elemental calcium total) by mouth 2 (two) times daily with a meal. 60 tablet 0   celecoxib (CELEBREX) 100 MG capsule TAKE 1 CAPSULE BY MOUTH 2 TIMES DAILY WITH FOOD AS NEEDED FOR PAIN & INFLAMMATION 180 capsule 0   dexamethasone (DECADRON) 4 MG tablet Take two tablets (8 mg) every eight hours as needed with Zofran for nausea.  60 tablet 3   levETIRAcetam (KEPPRA) 500 MG tablet TAKE 1 TABLET BY MOUTH 2 TIMES A DAY 60 tablet 3   loperamide (IMODIUM) 2 MG capsule Take 1 capsule (2 mg total) by mouth as needed for diarrhea or loose stools. 30 capsule 0   MAGNESIUM PO 200 mg daily.     metoprolol succinate (TOPROL-XL) 25 MG 24 hr tablet Take 25 mg by mouth daily.     ondansetron (ZOFRAN) 8 MG tablet TAKE 1 TABLET BY MOUTH EVERY 8 HOURS AS NEEDED NAUSEA AND VOMITING 180 tablet 0   pantoprazole (PROTONIX) 40 MG tablet TAKE 1 TABLET BY MOUTH 2 TIMES A DAY BEFORE MEALS FOR ACID  REFLUX (Patient not taking: Reported on 01/25/2023) 180 tablet 0   polyethylene glycol powder (GLYCOLAX/MIRALAX) 17 GM/SCOOP powder Take by mouth daily.     pregabalin (LYRICA) 200 MG capsule TAKE 1 CAPSULE BY MOUTH 2 TIMES DAILY 180 capsule 0   prochlorperazine (COMPAZINE) 10 MG tablet Take 1 tablet (10 mg total) by mouth every 6 (six) hours as needed for nausea or vomiting. 30 tablet 3   saccharomyces boulardii (FLORASTOR) 250 MG capsule Take by mouth daily.     tamsulosin (FLOMAX) 0.4 MG CAPS capsule Take 1 capsule (0.4 mg total) by mouth at bedtime. 90 capsule 0   traMADol (ULTRAM) 50 MG tablet TAKE TWO TABLETS BY MOUTH EVERY 6 HOURS AS NEEDED FOR PAIN 240 tablet 0   No current facility-administered medications for this encounter.    Physical Findings: The patient is in no acute distress. Patient is alert and oriented.  weight is 202 lb 6.4 oz (91.8 kg). His temperature is 97.6 F (36.4 C). His blood pressure is 126/55 (abnormal) and his pulse is 57 (abnormal). His respiration is 20 and oxygen saturation is 97%. .   Lungs are clear to auscultation bilaterally. Heart has regular rate and rhythm. No palpable cervical, supraclavicular, or axillary adenopathy. Abdomen soft, non-tender, normal bowel sounds.  Tenderness with palpation along the right sacroiliac joint area   Lab Findings: Lab Results  Component Value Date   WBC 4.7 12/27/2022   HGB 13.8 12/27/2022   HCT 41.6 12/27/2022   MCV 98.3 12/27/2022   PLT 174 12/27/2022    Radiographic Findings: No results found.  Impression: The primary encounter diagnosis was Malignant neoplasm metastatic to bone [facility]). A diagnosis of Kappa light chain myeloma (HCC) was also pertinent to this visit.   New hypermetabolic lesion in the right femoral head compatible with active myelomatous involvement (12/12/22 PET)   The patient is recovering well from the effects of radiation. He states his hip pain has improved since the radiation.  Patient  and family are concerned with new-onset low back pain. This pain is causing significant distress. Given his history, further imaging is warranted to evaluate for disease spread or potential disc problem.  Plan:  MRI of the lumbar spine ordered to further evaluate lower back pain. I will share this imaging with Dr. Myna Hidalgo.    ____________________________________   Joyice Faster, PA-C   Billie Lade, PhD, MD  This document serves as a record of services personally performed by Antony Blackbird, MD. It was created on his behalf by Neena Rhymes, a trained medical scribe. The creation of this record is based on the scribe's personal observations and the provider's statements to them. This document has been checked and approved by the attending provider.

## 2023-02-18 ENCOUNTER — Other Ambulatory Visit: Payer: Self-pay

## 2023-02-21 ENCOUNTER — Other Ambulatory Visit: Payer: Self-pay | Admitting: Hematology & Oncology

## 2023-02-21 DIAGNOSIS — C9000 Multiple myeloma not having achieved remission: Secondary | ICD-10-CM

## 2023-02-22 ENCOUNTER — Encounter: Payer: Self-pay | Admitting: Hematology & Oncology

## 2023-02-22 ENCOUNTER — Inpatient Hospital Stay: Payer: Medicare Other | Admitting: Hematology & Oncology

## 2023-02-22 ENCOUNTER — Inpatient Hospital Stay: Payer: Medicare Other | Attending: Hematology & Oncology

## 2023-02-22 ENCOUNTER — Inpatient Hospital Stay: Payer: Medicare Other

## 2023-02-22 VITALS — BP 126/60 | HR 57 | Temp 97.8°F | Resp 20 | Ht 68.0 in | Wt 201.4 lb

## 2023-02-22 VITALS — BP 112/60 | HR 58 | Resp 17

## 2023-02-22 DIAGNOSIS — C9000 Multiple myeloma not having achieved remission: Secondary | ICD-10-CM

## 2023-02-22 DIAGNOSIS — Z5112 Encounter for antineoplastic immunotherapy: Secondary | ICD-10-CM | POA: Insufficient documentation

## 2023-02-22 LAB — CMP (CANCER CENTER ONLY)
ALT: 19 U/L (ref 0–44)
AST: 17 U/L (ref 15–41)
Albumin: 3.9 g/dL (ref 3.5–5.0)
Alkaline Phosphatase: 65 U/L (ref 38–126)
Anion gap: 9 (ref 5–15)
BUN: 22 mg/dL (ref 8–23)
CO2: 27 mmol/L (ref 22–32)
Calcium: 9 mg/dL (ref 8.9–10.3)
Chloride: 106 mmol/L (ref 98–111)
Creatinine: 1.05 mg/dL (ref 0.61–1.24)
GFR, Estimated: >60 mL/min (ref >60)
Glucose, Bld: 102 mg/dL — ABNORMAL HIGH (ref 70–99)
Potassium: 4.1 mmol/L (ref 3.5–5.1)
Sodium: 142 mmol/L (ref 135–145)
Total Bilirubin: 0.4 mg/dL (ref 0.3–1.2)
Total Protein: 5.6 g/dL — ABNORMAL LOW (ref 6.5–8.1)

## 2023-02-22 LAB — LACTATE DEHYDROGENASE: LDH: 144 U/L (ref 98–192)

## 2023-02-22 LAB — CBC WITH DIFFERENTIAL (CANCER CENTER ONLY)
Abs Immature Granulocytes: 0.02 10*3/uL (ref 0.00–0.07)
Basophils Absolute: 0 10*3/uL (ref 0.0–0.1)
Basophils Relative: 1 %
Eosinophils Absolute: 0.1 10*3/uL (ref 0.0–0.5)
Eosinophils Relative: 2 %
HCT: 38.9 % — ABNORMAL LOW (ref 39.0–52.0)
Hemoglobin: 13.2 g/dL (ref 13.0–17.0)
Immature Granulocytes: 1 %
Lymphocytes Relative: 14 %
Lymphs Abs: 0.6 10*3/uL — ABNORMAL LOW (ref 0.7–4.0)
MCH: 32.6 pg (ref 26.0–34.0)
MCHC: 33.9 g/dL (ref 30.0–36.0)
MCV: 96 fL (ref 80.0–100.0)
Monocytes Absolute: 0.7 10*3/uL (ref 0.1–1.0)
Monocytes Relative: 17 %
Neutro Abs: 2.9 10*3/uL (ref 1.7–7.7)
Neutrophils Relative %: 65 %
Platelet Count: 153 10*3/uL (ref 150–400)
RBC: 4.05 MIL/uL — ABNORMAL LOW (ref 4.22–5.81)
RDW: 13.4 % (ref 11.5–15.5)
WBC Count: 4.3 10*3/uL (ref 4.0–10.5)
nRBC: 0 % (ref 0.0–0.2)

## 2023-02-22 MED ORDER — FAMOTIDINE 20 MG PO TABS: 40.0000 mg | ORAL_TABLET | Freq: Once | ORAL | Status: DC

## 2023-02-22 MED ORDER — DIPHENHYDRAMINE HCL 25 MG PO CAPS: 50.0000 mg | ORAL_CAPSULE | Freq: Once | ORAL | Status: DC

## 2023-02-22 MED ORDER — DARATUMUMAB-HYALURONIDASE-FIHJ 1800-30000 MG-UT/15ML ~~LOC~~ SOLN
1800.0000 mg | Freq: Once | SUBCUTANEOUS | Status: AC
  Administered 2023-02-22: 1800 mg via SUBCUTANEOUS
  Filled 2023-02-22: qty 15

## 2023-02-22 MED ORDER — ACETAMINOPHEN 325 MG PO TABS: 650.0000 mg | ORAL_TABLET | Freq: Once | ORAL | Status: DC

## 2023-02-22 NOTE — Progress Notes (Signed)
Hematology and Oncology Follow Up Visit  Bruce Smith [id number] 04-15-1951 72 y.o. 02/22/2023   Principle Diagnosis:  Kappa light chain myeloma-extensive bone disease-pathologic fracture of left scapula and left femur - t(11:14), 13q-, 1p-,1q-  Past Therapy: Status post surgical repair of left femur-07/14/2020 XRT to left shoulder and left hip Xgeva 120 mg subcu every 3 months-next dose in December 2022  Faspro/Velcade/Revlimid/Decadron -- s/p cycle #2 -- start on       08/06/2020  ( Revlimid is 14 on /14 off)    Current Therapy:        ASCT -- [facility] on -01/22/2021 Faspro - q month -- maintenance -- start on 05/13/2021 Zometa 4 mg IV every 3 months-next dose in 05/2023  Radiation therapy to T12, left iliac and left humerus --completed on 10/04/2022 --XRT to right femoral head -start 01/03/2023   Interim History:  Bruce Smith is here today with his wife for follow-up.  He is doing okay.  His back does bother him.  He has had chronic back issues.  He has been through radiation therapy.  He is tolerated radiation therapy well.  Despite the positive PET scans, his markers for myeloma certainly have been normal.  When we last saw him back in March, his Kappa light chain was 0.5 mg/dL.  He has had no problems with bowels or bladder.  He has had no issues with cough or shortness of breath.  There has been no rashes.  He has had no headache.  He has had no leg swelling.  He has had no bleeding.  His appetite has been quite good.  Overall, I would say his performance status is ECOG 1.  .   Medications:  Allergies as of 02/22/2023   No Known Allergies      Medication List        Accurate as of February 22, 2023 10:11 AM. If you have any questions, ask your nurse or doctor.          ACETAMINOPHEN ER PO Take 500 mg by mouth in the morning, at noon, in the evening, and at bedtime. Patient states that he is taking 500 mg   acyclovir 400 MG tablet Commonly known as: ZOVIRAX Take 1  tablet (400 mg total) by mouth 2 (two) times daily.   aspirin EC 81 MG tablet Take 81 mg by mouth daily.   calcium carbonate 1250 (500 Ca) MG tablet Commonly known as: OS-CAL - dosed in mg of elemental calcium Take 1 tablet (500 mg of elemental calcium total) by mouth 2 (two) times daily with a meal.   celecoxib 100 MG capsule Commonly known as: CELEBREX TAKE 1 CAPSULE BY MOUTH 2 TIMES DAILY WITH FOOD AS NEEDED FOR PAIN & INFLAMMATION   dexamethasone 4 MG tablet Commonly known as: DECADRON Take two tablets (8 mg) every eight hours as needed with Zofran for nausea.   levETIRAcetam 500 MG tablet Commonly known as: KEPPRA TAKE 1 TABLET BY MOUTH 2 TIMES A DAY   loperamide 2 MG capsule Commonly known as: IMODIUM Take 1 capsule (2 mg total) by mouth as needed for diarrhea or loose stools.   MAGNESIUM PO 200 mg daily.   metoprolol succinate 25 MG 24 hr tablet Commonly known as: TOPROL-XL Take 25 mg by mouth daily.   nitroGLYCERIN 0.4 MG SL tablet Commonly known as: NITROSTAT Place under the tongue every 5 (five) minutes as needed.   ondansetron 8 MG tablet Commonly known as: ZOFRAN TAKE 1 TABLET BY MOUTH  EVERY 8 HOURS AS NEEDED NAUSEA AND VOMITING   pantoprazole 40 MG tablet Commonly known as: PROTONIX TAKE 1 TABLET BY MOUTH 2 TIMES A DAY BEFORE MEALS FOR ACID REFLUX   polyethylene glycol powder 17 GM/SCOOP powder Commonly known as: GLYCOLAX/MIRALAX Take by mouth daily.   pregabalin 200 MG capsule Commonly known as: LYRICA TAKE 1 CAPSULE BY MOUTH 2 TIMES DAILY   prochlorperazine 10 MG tablet Commonly known as: COMPAZINE Take 1 tablet (10 mg total) by mouth every 6 (six) hours as needed for nausea or vomiting.   saccharomyces boulardii 250 MG capsule Commonly known as: FLORASTOR Take by mouth daily.   tamsulosin 0.4 MG Caps capsule Commonly known as: FLOMAX Take 1 capsule (0.4 mg total) by mouth at bedtime.   traMADol 50 MG tablet Commonly known as:  ULTRAM TAKE TWO TABLETS BY MOUTH EVERY 6 HOURS AS NEEDED FOR PAIN        Allergies: No Known Allergies  Past Medical History, Surgical history, Social history, and Family History were reviewed and updated.  Review of Systems: Review of Systems  Constitutional: Negative.   HENT: Negative.    Eyes: Negative.   Respiratory: Negative.    Cardiovascular: Negative.   Gastrointestinal: Negative.   Genitourinary: Negative.   Musculoskeletal:  Positive for joint pain.  Skin: Negative.   Neurological: Negative.   Endo/Heme/Allergies: Negative.   Psychiatric/Behavioral: Negative.       Physical Exam:  height is  (1.727 m) and weight is 201 lb 6.4 oz (91.4 kg). His oral temperature is 97.8 F (36.6 C). His blood pressure is 126/60 and his pulse is 57 (abnormal). His respiration is 20.   Wt Readings from Last 3 Encounters:  02/22/23 201 lb 6.4 oz (91.4 kg)  02/16/23 202 lb 6.4 oz (91.8 kg)  01/25/23 200 lb 0.6 oz (90.7 kg)    Physical Exam Vitals reviewed.  HENT:     Head: Normocephalic and atraumatic.  Eyes:     Pupils: Pupils are equal, round, and reactive to light.  Cardiovascular:     Rate and Rhythm: Normal rate and regular rhythm.     Heart sounds: Normal heart sounds.  Pulmonary:     Effort: Pulmonary effort is normal.     Breath sounds: Normal breath sounds.  Abdominal:     General: Bowel sounds are normal.     Palpations: Abdomen is soft.  Musculoskeletal:        General: No tenderness or deformity. Normal range of motion.     Cervical back: Normal range of motion.     Comments: There is pain to palpation in the left hip area.  This is in the lateral hip area.  He has some decreased range of motion.  Lymphadenopathy:     Cervical: No cervical adenopathy.  Skin:    General: Skin is warm and dry.     Findings: No erythema or rash.  Neurological:     Mental Status: He is alert and oriented to person, place, and time.  Psychiatric:        Behavior:  Behavior normal.        Thought Content: Thought content normal.        Judgment: Judgment normal.      Lab Results  Component Value Date   WBC 4.3 02/22/2023   HGB 13.2 02/22/2023   HCT 38.9 (L) 02/22/2023   MCV 96.0 02/22/2023   PLT 153 02/22/2023   Lab Results  Component Value Date  FERRITIN 938 (H) 02/23/2021   IRON 67 02/23/2021   TIBC 156 (L) 02/23/2021   UIBC 90 (L) 02/23/2021   IRONPCTSAT 43 02/23/2021   Lab Results  Component Value Date   RBC 4.05 (L) 02/22/2023   Lab Results  Component Value Date   KPAFRELGTCHN 4.9 12/27/2022   LAMBDASER 3.3 (L) 12/27/2022   KAPLAMBRATIO >2.94 12/30/2022   Lab Results  Component Value Date   IGGSERUM 293 (L) 12/27/2022   IGA 12 (L) 12/27/2022   IGMSERUM 5 (L) 12/27/2022   Lab Results  Component Value Date   TOTALPROTELP 5.6 (L) 12/27/2022   ALBUMINELP 3.6 12/27/2022   A1GS 0.2 12/27/2022   A2GS 0.8 12/27/2022   BETS 0.8 12/27/2022   GAMS 0.3 (L) 12/27/2022   MSPIKE Not Observed 12/27/2022     Chemistry      Component Value Date/Time   NA 142 02/22/2023 0858   K 4.1 02/22/2023 0858   CL 106 02/22/2023 0858   CO2 27 02/22/2023 0858   BUN 22 02/22/2023 0858   CREATININE 1.05 02/22/2023 0858      Component Value Date/Time   CALCIUM 9.0 02/22/2023 0858   ALKPHOS 65 02/22/2023 0858   AST 17 02/22/2023 0858   ALT 19 02/22/2023 0858   BILITOT 0.4 02/22/2023 0858       Impression and Plan: Mr. Coates is a very pleasant 72 yo gentleman with 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   Of note, his last bone marrow test that we did on him was back in November 2023.  We will continue him on the daratumumab.  He will get his Zometa dose today.  We will plan to see him back in 1 month.  Again, we probably will have to do another PET scan on him in June to see how everything looks with respect to any myelomatous involvement of his bones.    Josph Macho,  MD 4/24/202410:11 AM

## 2023-02-22 NOTE — Progress Notes (Signed)
Patient would like to skip Zometa today and receive it at his next appointment.  Anola Gurney Oblong, [place], BCPS, BCOP 02/22/2023 11:22 AM

## 2023-02-22 NOTE — Patient Instructions (Signed)
[facility]  Discharge Instructions: Thank you for choosing [facility] to provide your oncology and hematology care.   If you have a lab appointment with the Cancer Center, please go directly to the Cancer Center and check in at the registration area.  Wear comfortable clothing and clothing appropriate for easy access to any Portacath or PICC line.   We strive to give you quality time with your provider. You may need to reschedule your appointment if you arrive late (15 or more minutes).  Arriving late affects you and other patients whose appointments are after yours.  Also, if you miss three or more appointments without notifying the office, you may be dismissed from the clinic at the provider's discretion.      For prescription refill requests, have your pharmacy contact our office and allow 72 hours for refills to be completed.    Today you received the following chemotherapy and/or immunotherapy agents Darzalex      To help prevent nausea and vomiting after your treatment, we encourage you to take your nausea medication as directed.  BELOW ARE SYMPTOMS THAT SHOULD BE REPORTED IMMEDIATELY: *FEVER GREATER THAN 100.4 F (38 C) OR HIGHER *CHILLS OR SWEATING *NAUSEA AND VOMITING THAT IS NOT CONTROLLED WITH YOUR NAUSEA MEDICATION *UNUSUAL SHORTNESS OF BREATH *UNUSUAL BRUISING OR BLEEDING *URINARY PROBLEMS (pain or burning when urinating, or frequent urination) *BOWEL PROBLEMS (unusual diarrhea, constipation, pain near the anus) TENDERNESS IN MOUTH AND THROAT WITH OR WITHOUT PRESENCE OF ULCERS (sore throat, sores in mouth, or a toothache) UNUSUAL RASH, SWELLING OR PAIN  UNUSUAL VAGINAL DISCHARGE OR ITCHING   Items with * indicate a potential emergency and should be followed up as soon as possible or go to the Emergency Department if any problems should occur.  Please show the CHEMOTHERAPY ALERT CARD or IMMUNOTHERAPY ALERT CARD at check-in  to the Emergency Department and triage nurse. Should you have questions after your visit or need to cancel or reschedule your appointment, please contact [facility]  [phone] and follow the prompts.  Office hours are 8:00 a.m. to 4:30 p.m. Monday - Friday. Please note that voicemails left after 4:00 p.m. may not be returned until the following business day.  We are closed weekends and major holidays. You have access to a nurse at all times for urgent questions. Please call the main number to the clinic [phone] and follow the prompts.  For any non-urgent questions, you may also contact your provider using MyChart. We now offer e-Visits for anyone 18 and older to request care online for non-urgent symptoms. For details visit [email or web address].   Also download the MyChart app! Go to the app store, search "MyChart", open the app, select [facility], and log in with your MyChart username and password.   

## 2023-02-23 LAB — KAPPA/LAMBDA LIGHT CHAINS
Kappa free light chain: 6.4 mg/L (ref 3.3–19.4)
Kappa, lambda light chain ratio: 1.52 (ref 0.26–1.65)
Lambda free light chains: 4.2 mg/L — ABNORMAL LOW (ref 5.7–26.3)

## 2023-02-23 LAB — IGG, IGA, IGM
IgA: 14 mg/dL — ABNORMAL LOW (ref 61–437)
IgG (Immunoglobin G), Serum: 290 mg/dL — ABNORMAL LOW (ref 603–1613)
IgM (Immunoglobulin M), Srm: 9 mg/dL — ABNORMAL LOW (ref 15–143)

## 2023-02-24 LAB — PROTEIN ELECTROPHORESIS, SERUM, WITH REFLEX
A/G Ratio: 1.4 (ref 0.7–1.7)
Albumin ELP: 3.3 g/dL (ref 2.9–4.4)
Alpha-1-Globulin: 0.2 g/dL (ref 0.0–0.4)
Alpha-2-Globulin: 0.9 g/dL (ref 0.4–1.0)
Beta Globulin: 0.8 g/dL (ref 0.7–1.3)
Gamma Globulin: 0.3 g/dL — ABNORMAL LOW (ref 0.4–1.8)
Globulin, Total: 2.3 g/dL (ref 2.2–3.9)
Total Protein ELP: 5.6 g/dL — ABNORMAL LOW (ref 6.0–8.5)

## 2023-02-27 ENCOUNTER — Other Ambulatory Visit: Payer: Self-pay | Admitting: Radiology

## 2023-02-27 ENCOUNTER — Telehealth: Payer: Self-pay | Admitting: *Deleted

## 2023-02-27 DIAGNOSIS — C7951 Secondary malignant neoplasm of bone: Secondary | ICD-10-CM

## 2023-02-27 NOTE — Telephone Encounter (Signed)
Called patient to inform of MRI for- 5//3/24- arrival time- 2:45 pm @ [facility], no restrictions to test, patient to receive results from Dr. Roselind Messier on 03/16/23 @ 9:15 am, spoke with patient and he is aware of these appts. and the instructions

## 2023-03-04 ENCOUNTER — Ambulatory Visit (HOSPITAL_COMMUNITY)
Admission: RE | Admit: 2023-03-04 | Discharge: 2023-03-04 | Disposition: A | Discharge status: Home / Self Care | Payer: Medicare Other | Source: Ambulatory Visit | Attending: Radiology | Admitting: Radiology

## 2023-03-04 DIAGNOSIS — C7951 Secondary malignant neoplasm of bone: Secondary | ICD-10-CM | POA: Insufficient documentation

## 2023-03-04 MED ORDER — GADOBUTROL 1 MMOL/ML IV SOLN
9.0000 mL | Freq: Once | INTRAVENOUS | Status: AC | PRN
  Administered 2023-03-04: 9 mL via INTRAVENOUS

## 2023-03-07 ENCOUNTER — Other Ambulatory Visit: Payer: Self-pay

## 2023-03-08 ENCOUNTER — Emergency Department (HOSPITAL_COMMUNITY): Payer: Medicare Other

## 2023-03-08 ENCOUNTER — Inpatient Hospital Stay (HOSPITAL_COMMUNITY)
Admission: EM | Admit: 2023-03-08 | Discharge: 2023-03-22 | DRG: 234 | Disposition: A | Discharge status: Home Health | Payer: Medicare Other | Attending: Surgery | Admitting: Surgery

## 2023-03-08 ENCOUNTER — Other Ambulatory Visit: Payer: Self-pay

## 2023-03-08 DIAGNOSIS — D72829 Elevated white blood cell count, unspecified: Secondary | ICD-10-CM | POA: Diagnosis not present

## 2023-03-08 DIAGNOSIS — R627 Adult failure to thrive: Secondary | ICD-10-CM

## 2023-03-08 DIAGNOSIS — Z823 Family history of stroke: Secondary | ICD-10-CM

## 2023-03-08 DIAGNOSIS — E785 Hyperlipidemia, unspecified: Secondary | ICD-10-CM | POA: Diagnosis present

## 2023-03-08 DIAGNOSIS — Z9221 Personal history of antineoplastic chemotherapy: Secondary | ICD-10-CM

## 2023-03-08 DIAGNOSIS — D62 Acute posthemorrhagic anemia: Secondary | ICD-10-CM | POA: Diagnosis not present

## 2023-03-08 DIAGNOSIS — Z1152 Encounter for screening for COVID-19: Secondary | ICD-10-CM

## 2023-03-08 DIAGNOSIS — Z8042 Family history of malignant neoplasm of prostate: Secondary | ICD-10-CM

## 2023-03-08 DIAGNOSIS — F17220 Nicotine dependence, chewing tobacco, uncomplicated: Secondary | ICD-10-CM | POA: Diagnosis present

## 2023-03-08 DIAGNOSIS — I10 Essential (primary) hypertension: Secondary | ICD-10-CM | POA: Diagnosis present

## 2023-03-08 DIAGNOSIS — E8770 Fluid overload, unspecified: Secondary | ICD-10-CM | POA: Diagnosis present

## 2023-03-08 DIAGNOSIS — R197 Diarrhea, unspecified: Secondary | ICD-10-CM | POA: Diagnosis not present

## 2023-03-08 DIAGNOSIS — E871 Hypo-osmolality and hyponatremia: Secondary | ICD-10-CM | POA: Diagnosis not present

## 2023-03-08 DIAGNOSIS — Z9484 Stem cells transplant status: Secondary | ICD-10-CM

## 2023-03-08 DIAGNOSIS — I2510 Atherosclerotic heart disease of native coronary artery without angina pectoris: Secondary | ICD-10-CM | POA: Diagnosis present

## 2023-03-08 DIAGNOSIS — Z951 Presence of aortocoronary bypass graft: Secondary | ICD-10-CM

## 2023-03-08 DIAGNOSIS — Z79899 Other long term (current) drug therapy: Secondary | ICD-10-CM

## 2023-03-08 DIAGNOSIS — K219 Gastro-esophageal reflux disease without esophagitis: Secondary | ICD-10-CM | POA: Diagnosis present

## 2023-03-08 DIAGNOSIS — Z923 Personal history of irradiation: Secondary | ICD-10-CM

## 2023-03-08 DIAGNOSIS — R339 Retention of urine, unspecified: Secondary | ICD-10-CM | POA: Diagnosis not present

## 2023-03-08 DIAGNOSIS — Z7982 Long term (current) use of aspirin: Secondary | ICD-10-CM

## 2023-03-08 DIAGNOSIS — R338 Other retention of urine: Secondary | ICD-10-CM | POA: Diagnosis present

## 2023-03-08 DIAGNOSIS — I214 Non-ST elevation (NSTEMI) myocardial infarction: Secondary | ICD-10-CM | POA: Diagnosis not present

## 2023-03-08 DIAGNOSIS — N401 Enlarged prostate with lower urinary tract symptoms: Secondary | ICD-10-CM | POA: Diagnosis present

## 2023-03-08 DIAGNOSIS — G8929 Other chronic pain: Secondary | ICD-10-CM | POA: Diagnosis present

## 2023-03-08 DIAGNOSIS — I493 Ventricular premature depolarization: Secondary | ICD-10-CM | POA: Diagnosis not present

## 2023-03-08 DIAGNOSIS — Z955 Presence of coronary angioplasty implant and graft: Secondary | ICD-10-CM

## 2023-03-08 DIAGNOSIS — G629 Polyneuropathy, unspecified: Secondary | ICD-10-CM | POA: Diagnosis present

## 2023-03-08 DIAGNOSIS — C9001 Multiple myeloma in remission: Secondary | ICD-10-CM | POA: Diagnosis present

## 2023-03-08 DIAGNOSIS — K5900 Constipation, unspecified: Secondary | ICD-10-CM | POA: Diagnosis present

## 2023-03-08 DIAGNOSIS — I252 Old myocardial infarction: Secondary | ICD-10-CM

## 2023-03-08 DIAGNOSIS — D696 Thrombocytopenia, unspecified: Secondary | ICD-10-CM | POA: Diagnosis present

## 2023-03-08 DIAGNOSIS — R509 Fever, unspecified: Secondary | ICD-10-CM | POA: Diagnosis not present

## 2023-03-08 LAB — BASIC METABOLIC PANEL
Anion gap: 12 (ref 5–15)
BUN: 20 mg/dL (ref 8–23)
CO2: 20 mmol/L — ABNORMAL LOW (ref 22–32)
Calcium: 9.3 mg/dL (ref 8.9–10.3)
Chloride: 106 mmol/L (ref 98–111)
Creatinine, Ser: 1.11 mg/dL (ref 0.61–1.24)
GFR, Estimated: >60 mL/min (ref >60)
Glucose, Bld: 120 mg/dL — ABNORMAL HIGH (ref 70–99)
Potassium: 4.6 mmol/L (ref 3.5–5.1)
Sodium: 138 mmol/L (ref 135–145)

## 2023-03-08 LAB — CBC
HCT: 39 % (ref 39.0–52.0)
Hemoglobin: 13.1 g/dL (ref 13.0–17.0)
MCH: 32 pg (ref 26.0–34.0)
MCHC: 33.6 g/dL (ref 30.0–36.0)
MCV: 95.4 fL (ref 80.0–100.0)
Platelets: 146 10*3/uL — ABNORMAL LOW (ref 150–400)
RBC: 4.09 MIL/uL — ABNORMAL LOW (ref 4.22–5.81)
RDW: 13.4 % (ref 11.5–15.5)
WBC: 6.1 10*3/uL (ref 4.0–10.5)
nRBC: 0 % (ref 0.0–0.2)

## 2023-03-08 LAB — CBG MONITORING, ED: Glucose-Capillary: 116 mg/dL — ABNORMAL HIGH (ref 70–99)

## 2023-03-08 LAB — TROPONIN I (HIGH SENSITIVITY): Troponin I (High Sensitivity): 1279 ng/L (ref <18)

## 2023-03-08 MED ORDER — HEPARIN BOLUS VIA INFUSION
4000.0000 [IU] | Freq: Once | INTRAVENOUS | Status: AC
  Administered 2023-03-08: 4000 [IU] via INTRAVENOUS
  Filled 2023-03-08: qty 4000

## 2023-03-08 MED ORDER — HEPARIN (PORCINE) 25000 UT/250ML-% IV SOLN
1300.0000 [IU]/h | INTRAVENOUS | Status: DC
  Administered 2023-03-08: 1300 [IU]/h via INTRAVENOUS
  Filled 2023-03-08: qty 250

## 2023-03-08 MED ORDER — NITROGLYCERIN 2 % TD OINT
1.0000 [in_us] | TOPICAL_OINTMENT | Freq: Four times a day (QID) | TRANSDERMAL | Status: DC
  Filled 2023-03-08: qty 1

## 2023-03-08 MED ORDER — NITROGLYCERIN IN D5W 200-5 MCG/ML-% IV SOLN: 0.0000 ug/min | INTRAVENOUS | Status: DC

## 2023-03-08 MED ORDER — ASPIRIN 81 MG PO CHEW: 324.0000 mg | CHEWABLE_TABLET | Freq: Once | ORAL | Status: DC

## 2023-03-08 NOTE — ED Notes (Signed)
Family approached desk and reported that patient is reporting a slight pain in his jaw since the heparin was started.

## 2023-03-08 NOTE — ED Provider Notes (Signed)
[place] EMERGENCY DEPARTMENT AT [facility] Provider Note   CSN: [id number] Arrival date & time: 03/08/23  1846     History  Chief Complaint  Patient presents with   Chest Pain    Bruce Smith is a 72 y.o. male.   Chest Pain    Patient has a history of coronary artery disease, myocardial infarction, hypertension, hyperlipidemia, multiple myeloma who presents to the ED for evaluation of chest pain.  Patient states he was doing yard work outside.  He started experiencing pain in his chest rating to his jaw and arms.  Patient took 1 nitroglycerin which did not help.  Patient took a second 1 which did not help initially so he called EMS.  Subsequently his pain however has resolved.  Patient was also given an aspirin.  Patient states the pain is similar to when he had his cardiac issues before.  Home Medications Prior to Admission medications   Medication Sig Start Date End Date Taking? Authorizing Provider  ACETAMINOPHEN ER PO Take 1,000 mg by mouth in the morning, at noon, in the evening, and at bedtime.   Yes [provider]  aspirin EC 81 MG tablet Take 81 mg by mouth at bedtime.   Yes [provider]  calcium carbonate (OS-CAL - DOSED IN MG OF ELEMENTAL CALCIUM) 1250 (500 Ca) MG tablet Take 1 tablet (500 mg of elemental calcium total) by mouth 2 (two) times daily with a meal. 03/17/21  Yes Love, Evlyn Kanner, PA-C  celecoxib (CELEBREX) 100 MG capsule TAKE 1 CAPSULE BY MOUTH 2 TIMES DAILY WITH FOOD AS NEEDED FOR PAIN & INFLAMMATION Patient taking differently: Take 100 mg by mouth 2 (two) times daily as needed (for pain and inflammation). 01/10/23  Yes Josph Macho, MD  levETIRAcetam (KEPPRA) 500 MG tablet TAKE 1 TABLET BY MOUTH 2 TIMES A DAY 02/07/23  Yes Ennever, Rose Phi, MD  loperamide (IMODIUM) 2 MG capsule Take 1 capsule (2 mg total) by mouth as needed for diarrhea or loose stools. 03/17/21  Yes Love, Evlyn Kanner, PA-C  MAGNESIUM PO Take 100 mg by mouth  daily. 03/01/22  Yes [provider]  metoprolol succinate (TOPROL-XL) 25 MG 24 hr tablet Take 25 mg by mouth at bedtime. 03/17/22  Yes [provider]  nitroGLYCERIN (NITROSTAT) 0.4 MG SL tablet Place 0.4 mg under the tongue every 5 (five) minutes as needed for chest pain. 11/11/20  Yes [provider]  ondansetron (ZOFRAN) 8 MG tablet TAKE 1 TABLET BY MOUTH EVERY 8 HOURS AS NEEDED NAUSEA AND VOMITING Patient taking differently: Take 8 mg by mouth every 8 (eight) hours as needed for nausea or vomiting. 09/28/22  Yes Antony Blackbird, MD  pantoprazole (PROTONIX) 40 MG tablet TAKE 1 TABLET BY MOUTH 2 TIMES A DAY BEFORE MEALS FOR ACID REFLUX Patient taking differently: Take 40 mg by mouth 2 (two) times daily before a meal. 01/10/23  Yes Ennever, Rose Phi, MD  polyethylene glycol powder (GLYCOLAX/MIRALAX) 17 GM/SCOOP powder Mix 17g (1 capful) with 8 oz of favorite beverage and drink every morning   Yes [provider]  pregabalin (LYRICA) 200 MG capsule TAKE 1 CAPSULE BY MOUTH 2 TIMES DAILY 02/21/23  Yes Ennever, Rose Phi, MD  prochlorperazine (COMPAZINE) 10 MG tablet Take 1 tablet (10 mg total) by mouth every 6 (six) hours as needed for nausea or vomiting. 10/05/22  Yes Ennever, Rose Phi, MD  saccharomyces boulardii (FLORASTOR) 250 MG capsule Take 250 mg by mouth daily. 03/17/21  Yes [provider]  tamsulosin (FLOMAX) 0.4 MG CAPS capsule Take 1 capsule (0.4 mg total) by mouth at bedtime. Patient taking differently: Take 0.4 mg by mouth in the morning. 12/22/22  Yes Ennever, Rose Phi, MD  traMADol (ULTRAM) 50 MG tablet TAKE TWO TABLETS BY MOUTH EVERY 6 HOURS AS NEEDED FOR PAIN Patient taking differently: Take 50 mg by mouth every 6 (six) hours. 02/21/23  Yes Ennever, Rose Phi, MD  acyclovir (ZOVIRAX) 400 MG tablet Take 1 tablet (400 mg total) by mouth 2 (two) times daily. Patient not taking: Reported on 01/25/2023 09/16/21   Josph Macho, MD  dexamethasone (DECADRON) 4  MG tablet Take two tablets (8 mg) every eight hours as needed with Zofran for nausea. Patient not taking: Reported on 02/22/2023 10/05/22   Josph Macho, MD      Allergies    Patient has no known allergies.    Review of Systems   Review of Systems  Cardiovascular:  Positive for chest pain.    Physical Exam Updated Vital Signs BP 133/62   Pulse (!) 57   Temp 98.3 F (36.8 C) (Oral)   Resp 14   Ht 1.727 m (5\' 8" )   Wt 91.3 kg   SpO2 95%   BMI 30.60 kg/m  Physical Exam Vitals and nursing note reviewed.  Constitutional:      Appearance: He is well-developed. He is not diaphoretic.  HENT:     Head: Normocephalic and atraumatic.     Right Ear: External ear normal.     Left Ear: External ear normal.  Eyes:     General: No scleral icterus.       Right eye: No discharge.        Left eye: No discharge.     Conjunctiva/sclera: Conjunctivae normal.  Neck:     Trachea: No tracheal deviation.  Cardiovascular:     Rate and Rhythm: Normal rate and regular rhythm.  Pulmonary:     Effort: Pulmonary effort is normal. No respiratory distress.     Breath sounds: Normal breath sounds. No stridor. No wheezing or rales.  Abdominal:     General: Bowel sounds are normal. There is no distension.     Palpations: Abdomen is soft.     Tenderness: There is no abdominal tenderness. There is no guarding or rebound.  Musculoskeletal:        General: No tenderness or deformity.     Cervical back: Neck supple.     Right lower leg: No edema.     Left lower leg: No edema.  Skin:    General: Skin is warm and dry.     Findings: No rash.  Neurological:     General: No focal deficit present.     Mental Status: He is alert.     Cranial Nerves: No cranial nerve deficit, dysarthria or facial asymmetry.     Sensory: No sensory deficit.     Motor: No abnormal muscle tone or seizure activity.     Coordination: Coordination normal.  Psychiatric:        Mood and Affect: Mood normal.     ED  Results / Procedures / Treatments   Labs (all labs ordered are listed, but only abnormal results are displayed) Labs Reviewed  BASIC METABOLIC PANEL - Abnormal; Notable for the following components:      Result Value   CO2 20 (*)    Glucose, Bld 120 (*)    All other components within normal  limits  CBC - Abnormal; Notable for the following components:   RBC 4.09 (*)    Platelets 146 (*)    All other components within normal limits  CBG MONITORING, ED - Abnormal; Notable for the following components:   Glucose-Capillary 116 (*)    All other components within normal limits  TROPONIN I (HIGH SENSITIVITY) - Abnormal; Notable for the following components:   Troponin I (High Sensitivity) 1,279 (*)    All other components within normal limits  HEPARIN LEVEL (UNFRACTIONATED)  CBC  TROPONIN I (HIGH SENSITIVITY)    EKG None  Radiology DG Chest Portable 1 View  Result Date: 03/08/2023 CLINICAL DATA:  Chest pain and shortness of breath EXAM: PORTABLE CHEST 1 VIEW COMPARISON:  11/28/2020, PET-CT from 12/12/2022 FINDINGS: Cardiac shadow is within normal limits. Lungs are well aerated bilaterally. No focal infiltrate or effusion is seen. Persistent sclerotic lesion is noted within the left scapula stable from prior PET-CT. IMPRESSION: No acute abnormality noted. Electronically Signed   By: Alcide Clever M.D.   On: 03/08/2023 19:45    Procedures .Critical Care  Performed by: Linwood Dibbles, MD Authorized by: Linwood Dibbles, MD   Critical care provider statement:    Critical care time (minutes):  45   Critical care was time spent personally by me on the following activities:  Development of treatment plan with patient or surrogate, discussions with consultants, evaluation of patient's response to treatment, examination of patient, ordering and review of laboratory studies, ordering and review of radiographic studies, ordering and performing treatments and interventions, pulse oximetry, re-evaluation of  patient's condition and review of old charts     Medications Ordered in ED Medications  heparin ADULT infusion 100 units/mL (25000 units/241mL) (1,300 Units/hr Intravenous New Bag/Given 03/08/23 2250)  nitroGLYCERIN 50 mg in dextrose 5 % 250 mL (0.2 mg/mL) infusion (has no administration in time range)  heparin bolus via infusion 4,000 Units (4,000 Units Intravenous Bolus from Bag 03/08/23 2250)    ED Course/ Medical Decision Making/ A&P Clinical Course as of 03/08/23 2331  Wed Mar 08, 2023  2157 Case discussed with cardiology, Dr Hyacinth Meeker, will admit to cardiology service [JK]  2211 Labs are notable for elevated troponin.  CBC and metabolic panel otherwise unremarkable [JK]  2329 Patient states he has had some slight discomfort in his jaw.  Will repeat an EKG.  Start nitroglycerin drip. [JK]    Clinical Course User Index [JK] Linwood Dibbles, MD                             Medical Decision Making Problems Addressed: NSTEMI (non-ST elevated myocardial infarction) [facility]): acute illness or injury that poses a threat to life or bodily functions  Amount and/or Complexity of Data Reviewed Labs: ordered. Decision-making details documented in ED Course. Radiology: ordered and independent interpretation performed.  Risk OTC drugs. Prescription drug management. Drug therapy requiring intensive monitoring for toxicity. Decision regarding hospitalization.   Patient presented to the ED for evaluation of chest pain.  Patient has known history of coronary artery disease.  Symptoms concerning for the possibility of acute coronary syndrome, myocardial infarction.  Initial EKG without signs of ST elevation.  Patient's symptoms had resolved by the time he arrived to ED.  Patient's initial troponin is elevated.  Consistent with non-STEMI.  IV heparin ordered.  Patient mentioned having some slight twinges of discomfort since has been here.  Nitroglycerin drip was ordered.  I have consulted  with cardiologist.   Will plan on admission to the hospital for further treatment.  Continue to monitor closely.  Repeat EKG for any recurrent symptoms.        Final Clinical Impression(s) / ED Diagnoses Final diagnoses:  NSTEMI (non-ST elevated myocardial infarction) [facility])    Rx / DC Orders ED Discharge Orders     None         Linwood Dibbles, MD 03/08/23 [phone]

## 2023-03-08 NOTE — ED Triage Notes (Signed)
Pt BIB [facility] from home for CP starting around 1730, patient administered 2 doses of SL nitro prior to EMS arrival which helped pain decrease to a 2/10 from a 8/10. EMS administered 324mg  aspirin. VSS, A&Ox4, NAD. Does have hx of MI and stents x3.

## 2023-03-08 NOTE — Progress Notes (Signed)
ANTICOAGULATION CONSULT NOTE - Initial Consult  Pharmacy Consult for heparin Indication: chest pain/ACS  No Known Allergies  Patient Measurements:   Heparin Dosing Weight: 87.2 kg  Vital Signs: Temp: 98.3 F (36.8 C) (05/08 1934) Temp Source: Oral (05/08 1934) BP: 139/66 (05/08 1915) Pulse Rate: 62 (05/08 1915)  Labs: Recent Labs    03/08/23 1932  HGB 13.1  HCT 39.0  PLT 146*  CREATININE 1.11  TROPONINIHS 1,279*    CrCl cannot be calculated (Unknown ideal weight.).   Medical History: Past Medical History:  Diagnosis Date   Atherosclerosis    Coronary artery disease    Goals of care, counseling/discussion 06/29/2020   History of radiation therapy    Left Humerus, T-spine, Pelvis- 09/19/22-10/04/22- Dr. Antony Blackbird   History of radiation therapy    Right Femur- 01/15/23-01/16/23- Dr. 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 Previous MI s/p 3 stents. No anticoag PTA. Troponin 1279 and CBC stable.   Goal of Therapy:  Heparin level 0.3-0.7 units/ml Monitor platelets by anticoagulation protocol: Yes   Plan:  Give heparin 4000 unit bolus Start heparin at 1300 units/hr Heparin level in 8 hours and daily CBC daily  Thank you for involving pharmacy in this patient's care.  Enos Fling, PharmD PGY2 Pharmacy Resident 03/08/2023 10:26 PM

## 2023-03-09 ENCOUNTER — Inpatient Hospital Stay (HOSPITAL_COMMUNITY): Payer: Medicare Other

## 2023-03-09 ENCOUNTER — Encounter (HOSPITAL_COMMUNITY)
Admission: EM | Disposition: A | Discharge status: Home Health | Payer: Self-pay | Source: Home / Self Care | Attending: Surgery

## 2023-03-09 ENCOUNTER — Other Ambulatory Visit: Payer: Self-pay

## 2023-03-09 DIAGNOSIS — Z79899 Other long term (current) drug therapy: Secondary | ICD-10-CM | POA: Diagnosis not present

## 2023-03-09 DIAGNOSIS — I2510 Atherosclerotic heart disease of native coronary artery without angina pectoris: Secondary | ICD-10-CM

## 2023-03-09 DIAGNOSIS — D62 Acute posthemorrhagic anemia: Secondary | ICD-10-CM | POA: Diagnosis not present

## 2023-03-09 DIAGNOSIS — E785 Hyperlipidemia, unspecified: Secondary | ICD-10-CM | POA: Diagnosis present

## 2023-03-09 DIAGNOSIS — K5900 Constipation, unspecified: Secondary | ICD-10-CM | POA: Diagnosis present

## 2023-03-09 DIAGNOSIS — E8770 Fluid overload, unspecified: Secondary | ICD-10-CM | POA: Diagnosis present

## 2023-03-09 DIAGNOSIS — Z823 Family history of stroke: Secondary | ICD-10-CM | POA: Diagnosis not present

## 2023-03-09 DIAGNOSIS — G8929 Other chronic pain: Secondary | ICD-10-CM | POA: Diagnosis present

## 2023-03-09 DIAGNOSIS — I214 Non-ST elevation (NSTEMI) myocardial infarction: Principal | ICD-10-CM

## 2023-03-09 DIAGNOSIS — D72829 Elevated white blood cell count, unspecified: Secondary | ICD-10-CM | POA: Diagnosis not present

## 2023-03-09 DIAGNOSIS — F17220 Nicotine dependence, chewing tobacco, uncomplicated: Secondary | ICD-10-CM | POA: Diagnosis present

## 2023-03-09 DIAGNOSIS — D696 Thrombocytopenia, unspecified: Secondary | ICD-10-CM | POA: Diagnosis present

## 2023-03-09 DIAGNOSIS — Z87891 Personal history of nicotine dependence: Secondary | ICD-10-CM | POA: Diagnosis not present

## 2023-03-09 DIAGNOSIS — E871 Hypo-osmolality and hyponatremia: Secondary | ICD-10-CM | POA: Diagnosis not present

## 2023-03-09 DIAGNOSIS — Z7982 Long term (current) use of aspirin: Secondary | ICD-10-CM | POA: Diagnosis not present

## 2023-03-09 DIAGNOSIS — R338 Other retention of urine: Secondary | ICD-10-CM | POA: Diagnosis present

## 2023-03-09 DIAGNOSIS — Z9484 Stem cells transplant status: Secondary | ICD-10-CM | POA: Diagnosis not present

## 2023-03-09 DIAGNOSIS — I10 Essential (primary) hypertension: Secondary | ICD-10-CM | POA: Diagnosis present

## 2023-03-09 DIAGNOSIS — Z1152 Encounter for screening for COVID-19: Secondary | ICD-10-CM | POA: Diagnosis not present

## 2023-03-09 DIAGNOSIS — C9001 Multiple myeloma in remission: Secondary | ICD-10-CM | POA: Diagnosis present

## 2023-03-09 DIAGNOSIS — Z9221 Personal history of antineoplastic chemotherapy: Secondary | ICD-10-CM | POA: Diagnosis not present

## 2023-03-09 DIAGNOSIS — I252 Old myocardial infarction: Secondary | ICD-10-CM | POA: Diagnosis not present

## 2023-03-09 DIAGNOSIS — K219 Gastro-esophageal reflux disease without esophagitis: Secondary | ICD-10-CM | POA: Diagnosis present

## 2023-03-09 DIAGNOSIS — Z923 Personal history of irradiation: Secondary | ICD-10-CM | POA: Diagnosis not present

## 2023-03-09 DIAGNOSIS — N401 Enlarged prostate with lower urinary tract symptoms: Secondary | ICD-10-CM | POA: Diagnosis present

## 2023-03-09 DIAGNOSIS — Z01810 Encounter for preprocedural cardiovascular examination: Secondary | ICD-10-CM | POA: Diagnosis not present

## 2023-03-09 HISTORY — PX: LEFT HEART CATH AND CORONARY ANGIOGRAPHY: CATH118249

## 2023-03-09 LAB — TSH: TSH: 2.847 u[IU]/mL (ref 0.350–4.500)

## 2023-03-09 LAB — CBC
HCT: 37.6 % — ABNORMAL LOW (ref 39.0–52.0)
Hemoglobin: 12.5 g/dL — ABNORMAL LOW (ref 13.0–17.0)
MCH: 31.8 pg (ref 26.0–34.0)
MCHC: 33.2 g/dL (ref 30.0–36.0)
MCV: 95.7 fL (ref 80.0–100.0)
Platelets: 133 10*3/uL — ABNORMAL LOW (ref 150–400)
RBC: 3.93 MIL/uL — ABNORMAL LOW (ref 4.22–5.81)
RDW: 13.4 % (ref 11.5–15.5)
WBC: 4.2 10*3/uL (ref 4.0–10.5)
nRBC: 0 % (ref 0.0–0.2)

## 2023-03-09 LAB — ECHOCARDIOGRAM COMPLETE
Area-P 1/2: 2.45 cm2
Calc EF: 57.9 %
Height: 68 in
S' Lateral: 3 cm
Single Plane A2C EF: 53 %
Single Plane A4C EF: 61.7 %
Weight: 3195.79 oz

## 2023-03-09 LAB — PROTIME-INR
INR: 1 (ref 0.8–1.2)
Prothrombin Time: 13.3 seconds (ref 11.4–15.2)

## 2023-03-09 LAB — BASIC METABOLIC PANEL
Anion gap: 8 (ref 5–15)
BUN: 21 mg/dL (ref 8–23)
CO2: 25 mmol/L (ref 22–32)
Calcium: 9.2 mg/dL (ref 8.9–10.3)
Chloride: 107 mmol/L (ref 98–111)
Creatinine, Ser: 1.2 mg/dL (ref 0.61–1.24)
GFR, Estimated: >60 mL/min (ref >60)
Glucose, Bld: 105 mg/dL — ABNORMAL HIGH (ref 70–99)
Potassium: 3.8 mmol/L (ref 3.5–5.1)
Sodium: 140 mmol/L (ref 135–145)

## 2023-03-09 LAB — HEPARIN LEVEL (UNFRACTIONATED): Heparin Unfractionated: 0.54 IU/mL (ref 0.30–0.70)

## 2023-03-09 LAB — LIPID PANEL
Cholesterol: 173 mg/dL (ref 0–200)
HDL: 41 mg/dL (ref >40)
LDL Cholesterol: 98 mg/dL (ref 0–99)
Total CHOL/HDL Ratio: 4.2 RATIO
Triglycerides: 168 mg/dL — ABNORMAL HIGH (ref <150)
VLDL: 34 mg/dL (ref 0–40)

## 2023-03-09 LAB — HEMOGLOBIN A1C
Hgb A1c MFr Bld: 5.5 % (ref 4.8–5.6)
Mean Plasma Glucose: 111.15 mg/dL

## 2023-03-09 LAB — TROPONIN I (HIGH SENSITIVITY): Troponin I (High Sensitivity): 7465 ng/L (ref <18)

## 2023-03-09 LAB — T4, FREE: Free T4: 0.74 ng/dL (ref 0.61–1.12)

## 2023-03-09 LAB — BRAIN NATRIURETIC PEPTIDE: B Natriuretic Peptide: 123.9 pg/mL — ABNORMAL HIGH (ref 0.0–100.0)

## 2023-03-09 SURGERY — LEFT HEART CATH AND CORONARY ANGIOGRAPHY
Anesthesia: LOCAL

## 2023-03-09 MED ORDER — HYDRALAZINE HCL 20 MG/ML IJ SOLN: 10.0000 mg | INTRAMUSCULAR | Status: AC | PRN

## 2023-03-09 MED ORDER — HEPARIN SODIUM (PORCINE) 1000 UNIT/ML IJ SOLN
INTRAMUSCULAR | Status: AC
  Filled 2023-03-09: qty 10

## 2023-03-09 MED ORDER — MIDAZOLAM HCL 2 MG/2ML IJ SOLN
INTRAMUSCULAR | Status: AC
  Filled 2023-03-09: qty 2

## 2023-03-09 MED ORDER — SODIUM CHLORIDE 0.9 % IV SOLN: 250.0000 mL | INTRAVENOUS | Status: DC | PRN

## 2023-03-09 MED ORDER — ONDANSETRON HCL 4 MG/2ML IJ SOLN: 4.0000 mg | Freq: Four times a day (QID) | INTRAMUSCULAR | Status: DC | PRN

## 2023-03-09 MED ORDER — PREGABALIN 75 MG PO CAPS
200.0000 mg | ORAL_CAPSULE | Freq: Two times a day (BID) | ORAL | Status: DC
  Administered 2023-03-09 – 2023-03-12 (×9): 200 mg via ORAL
  Filled 2023-03-09 (×9): qty 2

## 2023-03-09 MED ORDER — SODIUM CHLORIDE 0.9 % IV SOLN: INTRAVENOUS | Status: AC

## 2023-03-09 MED ORDER — MIDAZOLAM HCL 2 MG/2ML IJ SOLN
INTRAMUSCULAR | Status: DC | PRN
  Administered 2023-03-09: 1 mg via INTRAVENOUS

## 2023-03-09 MED ORDER — SODIUM CHLORIDE 0.9% FLUSH
3.0000 mL | Freq: Two times a day (BID) | INTRAVENOUS | Status: DC
  Administered 2023-03-09: 3 mL via INTRAVENOUS

## 2023-03-09 MED ORDER — METOPROLOL SUCCINATE ER 25 MG PO TB24
25.0000 mg | ORAL_TABLET | Freq: Every day | ORAL | Status: DC
  Administered 2023-03-09 – 2023-03-12 (×4): 25 mg via ORAL
  Filled 2023-03-09 (×4): qty 1

## 2023-03-09 MED ORDER — ONDANSETRON HCL 4 MG/2ML IJ SOLN
4.0000 mg | Freq: Four times a day (QID) | INTRAMUSCULAR | Status: DC | PRN
  Administered 2023-03-11 – 2023-03-12 (×2): 4 mg via INTRAVENOUS
  Filled 2023-03-09 (×2): qty 2

## 2023-03-09 MED ORDER — CALCIUM CARBONATE 1250 (500 CA) MG PO TABS
1.0000 | ORAL_TABLET | Freq: Two times a day (BID) | ORAL | Status: DC
  Administered 2023-03-09 – 2023-03-22 (×25): 1250 mg via ORAL
  Filled 2023-03-09 (×26): qty 1

## 2023-03-09 MED ORDER — HEPARIN SODIUM (PORCINE) 1000 UNIT/ML IJ SOLN
INTRAMUSCULAR | Status: DC | PRN
  Administered 2023-03-09: 4500 [IU] via INTRAVENOUS

## 2023-03-09 MED ORDER — VERAPAMIL HCL 2.5 MG/ML IV SOLN
INTRA_ARTERIAL | Status: DC | PRN
  Administered 2023-03-09: 15 mL via INTRA_ARTERIAL

## 2023-03-09 MED ORDER — MAGNESIUM OXIDE -MG SUPPLEMENT 400 (240 MG) MG PO TABS
200.0000 mg | ORAL_TABLET | Freq: Every day | ORAL | Status: DC
  Administered 2023-03-09 – 2023-03-12 (×4): 200 mg via ORAL
  Filled 2023-03-09 (×4): qty 1

## 2023-03-09 MED ORDER — FENTANYL CITRATE (PF) 100 MCG/2ML IJ SOLN
INTRAMUSCULAR | Status: DC | PRN
  Administered 2023-03-09: 25 ug via INTRAVENOUS

## 2023-03-09 MED ORDER — TAMSULOSIN HCL 0.4 MG PO CAPS
0.4000 mg | ORAL_CAPSULE | Freq: Every day | ORAL | Status: DC
  Administered 2023-03-09 – 2023-03-22 (×13): 0.4 mg via ORAL
  Filled 2023-03-09 (×13): qty 1

## 2023-03-09 MED ORDER — ATORVASTATIN CALCIUM 80 MG PO TABS: 80.0000 mg | ORAL_TABLET | Freq: Every day | ORAL | Status: DC

## 2023-03-09 MED ORDER — VERAPAMIL HCL 2.5 MG/ML IV SOLN
INTRAVENOUS | Status: AC
  Filled 2023-03-09: qty 2

## 2023-03-09 MED ORDER — NITROGLYCERIN 1 MG/10 ML FOR IR/CATH LAB
INTRA_ARTERIAL | Status: AC
  Filled 2023-03-09: qty 10

## 2023-03-09 MED ORDER — VERAPAMIL HCL 2.5 MG/ML IV SOLN: INTRAVENOUS | Status: DC | PRN

## 2023-03-09 MED ORDER — ASPIRIN 81 MG PO CHEW
81.0000 mg | CHEWABLE_TABLET | Freq: Every day | ORAL | Status: DC
  Administered 2023-03-10 – 2023-03-12 (×3): 81 mg via ORAL
  Filled 2023-03-09 (×3): qty 1

## 2023-03-09 MED ORDER — ACETAMINOPHEN 325 MG PO TABS: 650.0000 mg | ORAL_TABLET | ORAL | Status: DC | PRN

## 2023-03-09 MED ORDER — SODIUM CHLORIDE 0.9% FLUSH: 3.0000 mL | INTRAVENOUS | Status: DC | PRN

## 2023-03-09 MED ORDER — NITROGLYCERIN 0.4 MG SL SUBL
0.4000 mg | SUBLINGUAL_TABLET | SUBLINGUAL | Status: DC | PRN
  Administered 2023-03-11 (×3): 0.4 mg via SUBLINGUAL
  Filled 2023-03-09 (×3): qty 1

## 2023-03-09 MED ORDER — LIDOCAINE HCL (PF) 1 % IJ SOLN
INTRAMUSCULAR | Status: AC
  Filled 2023-03-09: qty 30

## 2023-03-09 MED ORDER — LEVETIRACETAM 500 MG PO TABS
500.0000 mg | ORAL_TABLET | Freq: Two times a day (BID) | ORAL | Status: DC
  Administered 2023-03-09 – 2023-03-22 (×27): 500 mg via ORAL
  Filled 2023-03-09 (×3): qty 1
  Filled 2023-03-09: qty 2
  Filled 2023-03-09 (×3): qty 1
  Filled 2023-03-09: qty 2
  Filled 2023-03-09 (×4): qty 1
  Filled 2023-03-09: qty 2
  Filled 2023-03-09 (×9): qty 1
  Filled 2023-03-09: qty 2
  Filled 2023-03-09 (×4): qty 1

## 2023-03-09 MED ORDER — PERFLUTREN LIPID MICROSPHERE
1.0000 mL | INTRAVENOUS | Status: AC | PRN
  Administered 2023-03-09: 2 mL via INTRAVENOUS

## 2023-03-09 MED ORDER — LABETALOL HCL 5 MG/ML IV SOLN: 10.0000 mg | INTRAVENOUS | Status: AC | PRN

## 2023-03-09 MED ORDER — HEPARIN (PORCINE) IN NACL 1000-0.9 UT/500ML-% IV SOLN
INTRAVENOUS | Status: DC | PRN
  Administered 2023-03-09 (×2): 500 mL

## 2023-03-09 MED ORDER — SODIUM CHLORIDE 0.9% FLUSH
3.0000 mL | Freq: Two times a day (BID) | INTRAVENOUS | Status: DC
  Administered 2023-03-10 – 2023-03-11 (×4): 3 mL via INTRAVENOUS

## 2023-03-09 MED ORDER — ASPIRIN 81 MG PO TBEC
81.0000 mg | DELAYED_RELEASE_TABLET | Freq: Every day | ORAL | Status: DC
  Administered 2023-03-09: 81 mg via ORAL
  Filled 2023-03-09: qty 1

## 2023-03-09 MED ORDER — IOHEXOL 350 MG/ML SOLN
INTRAVENOUS | Status: DC | PRN
  Administered 2023-03-09: 70 mL

## 2023-03-09 MED ORDER — MORPHINE SULFATE (PF) 2 MG/ML IV SOLN: 2.0000 mg | INTRAVENOUS | Status: DC | PRN

## 2023-03-09 MED ORDER — SODIUM CHLORIDE 0.9 % IV SOLN: INTRAVENOUS | Status: DC

## 2023-03-09 MED ORDER — FENTANYL CITRATE (PF) 100 MCG/2ML IJ SOLN
INTRAMUSCULAR | Status: AC
  Filled 2023-03-09: qty 2

## 2023-03-09 MED ORDER — LIDOCAINE HCL (PF) 1 % IJ SOLN
INTRAMUSCULAR | Status: DC | PRN
  Administered 2023-03-09: 2 mL

## 2023-03-09 MED ORDER — PANTOPRAZOLE SODIUM 40 MG PO TBEC
40.0000 mg | DELAYED_RELEASE_TABLET | Freq: Two times a day (BID) | ORAL | Status: DC
  Administered 2023-03-09 – 2023-03-12 (×8): 40 mg via ORAL
  Filled 2023-03-09 (×8): qty 1

## 2023-03-09 MED ORDER — ROSUVASTATIN CALCIUM 20 MG PO TABS
40.0000 mg | ORAL_TABLET | Freq: Every day | ORAL | Status: DC
  Administered 2023-03-09 – 2023-03-22 (×13): 40 mg via ORAL
  Filled 2023-03-09 (×13): qty 2

## 2023-03-09 SURGICAL SUPPLY — 11 items
CATH INFINITI JR4 5F (CATHETERS) IMPLANT
CATH OPTITORQUE TIG 4.0 5F (CATHETERS) IMPLANT
GLIDESHEATH SLEND A-KIT 6F 22G (SHEATH) IMPLANT
GUIDEWIRE INQWIRE 1.5J.035X260 (WIRE) IMPLANT
INQWIRE 1.5J .035X260CM (WIRE) ×1
KIT HEART LEFT (KITS) ×1 IMPLANT
PACK CARDIAC CATHETERIZATION (CUSTOM PROCEDURE TRAY) ×1 IMPLANT
SYR MEDRAD MARK 7 150ML (SYRINGE) ×1 IMPLANT
TRANSDUCER W/STOPCOCK (MISCELLANEOUS) ×1 IMPLANT
TUBING CIL FLEX 10 FLL-RA (TUBING) ×1 IMPLANT
WIRE HI TORQ VERSACORE-J 145CM (WIRE) IMPLANT

## 2023-03-09 NOTE — Progress Notes (Signed)
ANTICOAGULATION CONSULT NOTE   Pharmacy Consult for heparin Indication: chest pain/ACS  No Known Allergies  Patient Measurements: Height: 5\' 8"  (172.7 cm) Weight: 90.6 kg (199 lb 11.8 oz) IBW/kg (Calculated) : 68.4 Heparin Dosing Weight: 87.2 kg  Vital Signs: Temp: 97.8 F (36.6 C) (05/09 0730) Temp Source: Oral (05/09 0730) BP: 142/61 (05/09 0730) Pulse Rate: 68 (05/09 0730)  Labs: Recent Labs    03/08/23 1932 03/08/23 2320 03/09/23 0304 03/09/23 0715  HGB 13.1  --  12.5*  --   HCT 39.0  --  37.6*  --   PLT 146*  --  133*  --   LABPROT  --   --  13.3  --   INR  --   --  1.0  --   HEPARINUNFRC  --   --   --  0.54  CREATININE 1.11  --  1.20  --   TROPONINIHS 1,279* 7,465*  --   --      Estimated Creatinine Clearance: 61.7 mL/min (by C-G formula based on SCr of 1.2 mg/dL).   Medical History: Past Medical History:  Diagnosis Date   Atherosclerosis    Coronary artery disease    Goals of care, counseling/discussion 06/29/2020   History of radiation therapy    Left Humerus, T-spine, Pelvis- 09/19/22-10/04/22- Dr. Antony Blackbird   History of radiation therapy    Right Femur- 01/15/23-01/16/23- Dr. 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   03/08/2023 at noon   aspirin EC 81 MG tablet Take 81 mg by mouth at bedtime.   03/07/2023 at pm   calcium carbonate (OS-CAL - DOSED IN MG OF ELEMENTAL CALCIUM) 1250 (500 Ca) MG tablet Take 1 tablet (500 mg of elemental calcium total) by mouth 2 (two) times daily with a meal. 60 tablet 0 03/08/2023 at am   celecoxib (CELEBREX) 100 MG capsule TAKE 1 CAPSULE BY MOUTH 2  TIMES DAILY WITH FOOD AS NEEDED FOR PAIN & INFLAMMATION (Patient taking differently: Take 100 mg by mouth 2 (two) times daily as needed (for pain and inflammation).) 180 capsule 0 03/08/2023 at am   levETIRAcetam (KEPPRA) 500 MG tablet TAKE 1 TABLET BY MOUTH 2 TIMES A DAY 60 tablet 3 03/08/2023 at am   loperamide (IMODIUM) 2 MG capsule Take 1 capsule (2 mg total) by mouth as needed for diarrhea or loose stools. 30 capsule 0 UNKNOWN   MAGNESIUM PO Take 100 mg by mouth daily.   03/08/2023 at am   metoprolol succinate (TOPROL-XL) 25 MG 24 hr tablet Take 25 mg by mouth at bedtime.   03/07/2023 at 1900   nitroGLYCERIN (NITROSTAT) 0.4 MG SL tablet Place 0.4 mg under the tongue every 5 (five) minutes as needed for chest pain.   03/08/2023 at 1630   ondansetron (ZOFRAN) 8 MG tablet TAKE 1 TABLET BY MOUTH EVERY 8 HOURS AS NEEDED NAUSEA AND VOMITING (Patient taking differently: Take 8 mg by mouth every 8 (eight) hours as needed for nausea or vomiting.) 180 tablet 0 Past Week   pantoprazole (PROTONIX) 40 MG tablet TAKE 1 TABLET BY MOUTH 2 TIMES A  DAY BEFORE MEALS FOR ACID REFLUX (Patient taking differently: Take 40 mg by mouth 2 (two) times daily before a meal.) 180 tablet 0 03/08/2023 at am   polyethylene glycol powder (GLYCOLAX/MIRALAX) 17 GM/SCOOP powder Mix 17g (1 capful) with 8 oz of favorite beverage and drink every morning   03/08/2023 at am   pregabalin (LYRICA) 200 MG capsule TAKE 1 CAPSULE BY MOUTH 2 TIMES DAILY 180 capsule 0 03/08/2023 at am   prochlorperazine (COMPAZINE) 10 MG tablet Take 1 tablet (10 mg total) by mouth every 6 (six) hours as needed for nausea or vomiting. 30 tablet 3 UNKNOWN   saccharomyces boulardii (FLORASTOR) 250 MG capsule Take 250 mg by mouth daily.   03/08/2023 at am   tamsulosin (FLOMAX) 0.4 MG CAPS capsule Take 1 capsule (0.4 mg total) by mouth at bedtime. (Patient taking differently: Take 0.4 mg by mouth in the morning.) 90 capsule 0 03/08/2023 at am   traMADol (ULTRAM) 50 MG tablet TAKE TWO  TABLETS BY MOUTH EVERY 6 HOURS AS NEEDED FOR PAIN (Patient taking differently: Take 50 mg by mouth every 6 (six) hours.) 240 tablet 0 03/08/2023 at 1500   acyclovir (ZOVIRAX) 400 MG tablet Take 1 tablet (400 mg total) by mouth 2 (two) times daily. (Patient not taking: Reported on 01/25/2023) 180 tablet 0 Not Taking   dexamethasone (DECADRON) 4 MG tablet Take two tablets (8 mg) every eight hours as needed with Zofran for nausea. (Patient not taking: Reported on 02/22/2023) 60 tablet 3 Not Taking   Scheduled:   aspirin EC  81 mg Oral QHS   calcium carbonate  1 tablet Oral BID WC   levETIRAcetam  500 mg Oral BID   magnesium oxide  200 mg Oral Daily   metoprolol succinate  25 mg Oral Daily   pantoprazole  40 mg Oral BID AC   pregabalin  200 mg Oral BID   rosuvastatin  40 mg Oral Daily   tamsulosin  0.4 mg Oral Daily   Infusions:   sodium chloride 50 mL/hr at 03/09/23 0638   heparin 1,300 Units/hr (03/09/23 0600)   nitroGLYCERIN Stopped (03/09/23 0012)    Assessment: Patient presented with chest pain. Previous MI s/p 3 stents. No anticoag PTA. Troponin 1279 and CBC stable.   On IV heparin with level at goal this AM.  No overt bleeding or complications noted.  Goal of Therapy:  Heparin level 0.3-0.7 units/ml Monitor platelets by anticoagulation protocol: Yes   Plan:  Continue IV heparin at 1300 units/hr.   F/u plans after cath today.  Reece Leader, Colon Flattery, BCCP Clinical Pharmacist  03/09/2023 8:13 AM   [facility] pharmacy phone numbers are listed on [email or web address]

## 2023-03-09 NOTE — ED Notes (Signed)
ED TO INPATIENT HANDOFF REPORT  ED Nurse Name and Phone #: Juliette Alcide RN [phone]  S Name/Age/Gender Bruce Smith 72 y.o. male Room/Bed: 001C/001C  Code Status   Code Status: Full Code  Home/SNF/Other Home Patient oriented to: self, place, time, and situation Is this baseline? Yes   Triage Complete: Triage complete  Chief Complaint NSTEMI (non-ST elevated myocardial infarction) [facility]) [I21.4]  Triage Note Pt BIB [facility] from home for CP starting around 1730, patient administered 2 doses of SL nitro prior to EMS arrival which helped pain decrease to a 2/10 from a 8/10. EMS administered 324mg  aspirin. VSS, A&Ox4, NAD. Does have hx of MI and stents x3.   Allergies No Known Allergies  Level of Care/Admitting Diagnosis ED Disposition     ED Disposition  Admit   Condition  --   Comment  Hospital Area: [facility] [[id number]]  Level of Care: Telemetry Cardiac [103]  May admit patient to [facility] or [facility] if equivalent level of care is available:: No  Covid Evaluation: Asymptomatic - no recent exposure (last 10 days) testing not required  Diagnosis: NSTEMI (non-ST elevated myocardial infarction) [facility]) [[id number]]  Admitting Physician: Joellen Jersey [phone]  Attending Physician: Chrystie Nose [phone]  Certification:: I certify this patient will need inpatient services for at least 2 midnights  Estimated Length of Stay: 2          B Medical/Surgery History Past Medical History:  Diagnosis Date   Atherosclerosis    Coronary artery disease    Goals of care, counseling/discussion 06/29/2020   History of radiation therapy    Left Humerus, T-spine, Pelvis- 09/19/22-10/04/22- Dr. Antony Blackbird   History of radiation therapy    Right Femur- 01/15/23-01/16/23- Dr. 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   Multiple myeloma (HCC)  07/21/2020   Myocardial infarction [facility])    Past Surgical History:  Procedure Laterality Date   ANTERIOR CERVICAL DECOMP/DISCECTOMY FUSION N/A 06/17/2020   Procedure: ANTERIOR CERVICAL DECOMPRESSION/DISCECTOMY FUSION, INTERBODY PROSTHESIS, PLATE/SCREWS CERVICAL FIVE- CERVICAL SIX;  Surgeon: Tressie Stalker, MD;  Location: [facility] OR;  Service: Neurosurgery;  Laterality: N/A;  ANTERIOR CERVICAL DECOMPRESSION/DISCECTOMY FUSION, INTERBODY PROSTHESIS, PLATE/SCREWS CERVICAL FIVE- CERVICAL SIX   BIOPSY  02/24/2021   Procedure: BIOPSY;  Surgeon: Kathi Der, MD;  Location: [facility] ENDOSCOPY;  Service: Gastroenterology;;   CARDIAC CATHETERIZATION     ESOPHAGOGASTRODUODENOSCOPY N/A 02/24/2021   Procedure: ESOPHAGOGASTRODUODENOSCOPY (EGD);  Surgeon: Kathi Der, MD;  Location: [facility];  Service: Gastroenterology;  Laterality: N/A;   FEMUR IM NAIL Left 07/14/2020   Procedure: OPEN REDUCTION INTERNAL FIXATION (ORIF LEFT FEMORAL NAIL FRACTURE;  Surgeon: Teryl Lucy, MD;  Location: [facility] OR;  Service: Orthopedics;  Laterality: Left;     A IV Location/Drains/Wounds Patient Lines/Drains/Airways Status     Active Line/Drains/Airways     Name Placement date Placement time Site Days   Peripheral IV 03/08/23 22 G Anterior;Left;Proximal Forearm 03/08/23  1934  Forearm  1   Peripheral IV 03/08/23 18 G 1.16" Left;Posterior Wrist 03/08/23  2247  Wrist  1            Intake/Output Last 24 hours No intake or output data in the 24 hours ending 03/09/23 0226  Labs/Imaging Results for orders placed or performed during the hospital encounter of 03/08/23 (from the past 48 hour(s))  CBG monitoring, ED     Status: Abnormal  Collection Time: 03/08/23  7:28 PM  Result Value Ref Range   Glucose-Capillary 116 (H) 70 - 99 mg/dL    Comment: Glucose reference range applies only to samples taken after fasting for at least 8 hours.  Basic metabolic panel     Status: Abnormal   Collection Time: 03/08/23  7:32 PM   Result Value Ref Range   Sodium 138 135 - 145 mmol/L   Potassium 4.6 3.5 - 5.1 mmol/L    Comment: HEMOLYSIS AT THIS LEVEL MAY AFFECT RESULT   Chloride 106 98 - 111 mmol/L   CO2 20 (L) 22 - 32 mmol/L   Glucose, Bld 120 (H) 70 - 99 mg/dL    Comment: Glucose reference range applies only to samples taken after fasting for at least 8 hours.   BUN 20 8 - 23 mg/dL   Creatinine, Ser 1.61 0.61 - 1.24 mg/dL   Calcium 9.3 8.9 - 09.6 mg/dL   GFR, Estimated >04 >54 mL/min    Comment: (NOTE) Calculated using the CKD-EPI Creatinine Equation (2021)    Anion gap 12 5 - 15    Comment: Performed at [facility], [address] [place], [place] [address]  Troponin I (High Sensitivity)     Status: Abnormal   Collection Time: 03/08/23  7:32 PM  Result Value Ref Range   Troponin I (High Sensitivity) 1,279 (HH) <18 ng/L    Comment: CRITICAL RESULT CALLED TO, READ BACK BY AND VERIFIED WITH P. BLANCHARD RN 03/08/23 @2055  BY J. WHITE (NOTE) Elevated high sensitivity troponin I (hsTnI) values and significant  changes across serial measurements may suggest ACS but many other  chronic and acute conditions are known to elevate hsTnI results.  Refer to the "Links" section for chest pain algorithms and additional  guidance. Performed at [facility], [address], [place], [place] [address]   CBC     Status: Abnormal   Collection Time: 03/08/23  7:32 PM  Result Value Ref Range   WBC 6.1 4.0 - 10.5 K/uL   RBC 4.09 (L) 4.22 - 5.81 MIL/uL   Hemoglobin 13.1 13.0 - 17.0 g/dL   HCT 29.5 62.1 - 30.8 %   MCV 95.4 80.0 - 100.0 fL   MCH 32.0 26.0 - 34.0 pg   MCHC 33.6 30.0 - 36.0 g/dL   RDW 65.7 84.6 - 96.2 %   Platelets 146 (L) 150 - 400 K/uL   nRBC 0.0 0.0 - 0.2 %    Comment: Performed at [facility], [address] [place], [place] [address]  Troponin I (High Sensitivity)     Status: Abnormal   Collection Time: 03/08/23 11:20 PM  Result Value Ref Range   Troponin I (High Sensitivity)  7,465 (HH) <18 ng/L    Comment: CRITICAL VALUE NOTED. VALUE IS CONSISTENT WITH PREVIOUSLY REPORTED/CALLED VALUE (NOTE) Elevated high sensitivity troponin I (hsTnI) values and significant  changes across serial measurements may suggest ACS but many other  chronic and acute conditions are known to elevate hsTnI results.  Refer to the "Links" section for chest pain algorithms and additional  guidance. Performed at [facility], [address], [place], [place] [address]    DG Chest Portable 1 View  Result Date: 03/08/2023 CLINICAL DATA:  Chest pain and shortness of breath EXAM: PORTABLE CHEST 1 VIEW COMPARISON:  11/28/2020, PET-CT from 12/12/2022 FINDINGS: Cardiac shadow is within normal limits. Lungs are well aerated bilaterally. No focal infiltrate or effusion is seen.  Persistent sclerotic lesion is noted within the left scapula stable from prior PET-CT. IMPRESSION: No acute abnormality noted. Electronically Signed   By: Alcide Clever M.D.   On: 03/08/2023 19:45    Pending Labs Unresulted Labs (From admission, onward)     Start     Ordered   03/10/23 0500  Heparin level (unfractionated)  Daily,   R      03/08/23 2229   03/09/23 0600  Heparin level (unfractionated)  Once-Timed,   URGENT        03/08/23 2216   03/09/23 0500  CBC  Daily,   R      03/08/23 2216   03/09/23 0500  Lipoprotein A (LPA)  Tomorrow morning,   R        03/09/23 0153   03/09/23 0500  Basic metabolic panel  Tomorrow morning,   R        03/09/23 0153   03/09/23 0500  Lipid panel  Tomorrow morning,   R        03/09/23 0153   03/09/23 0500  Protime-INR  Tomorrow morning,   R        03/09/23 0153   03/09/23 0500  TSH  Tomorrow morning,   R        03/09/23 0153   03/09/23 0500  T4, free  Tomorrow morning,   R        03/09/23 0153   03/09/23 0500  Hemoglobin A1c  Tomorrow morning,   R        03/09/23 0153   03/09/23 0500  Brain natriuretic peptide  Tomorrow morning,   R        03/09/23 0153             Vitals/Pain Today's Vitals   03/08/23 2321 03/08/23 2327 03/09/23 0012 03/09/23 0200  BP:    (!) 129/54  Pulse:    (!) 59  Resp:    16  Temp:    97.9 F (36.6 C)  TempSrc:    Oral  SpO2:    97%  Weight:      Height:      PainSc: 0-No pain 1  0-No pain 0-No pain    Isolation Precautions No active isolations  Medications Medications  heparin ADULT infusion 100 units/mL (25000 units/220mL) (1,300 Units/hr Intravenous New Bag/Given 03/08/23 2250)  nitroGLYCERIN 50 mg in dextrose 5 % 250 mL (0.2 mg/mL) infusion (0 mcg/min Intravenous Hold 03/09/23 0012)  aspirin EC tablet 81 mg (has no administration in time range)  metoprolol succinate (TOPROL-XL) 24 hr tablet 25 mg (has no administration in time range)  pantoprazole (PROTONIX) EC tablet 40 mg (has no administration in time range)  tamsulosin (FLOMAX) capsule 0.4 mg (has no administration in time range)  levETIRAcetam (KEPPRA) tablet 500 mg (has no administration in time range)  pregabalin (LYRICA) capsule 200 mg (has no administration in time range)  calcium carbonate (OS-CAL - dosed in mg of elemental calcium) tablet 1,250 mg (has no administration in time range)  Magnesium TABS 100 mg (has no administration in time range)  nitroGLYCERIN (NITROSTAT) SL tablet 0.4 mg (has no administration in time range)  acetaminophen (TYLENOL) tablet 650 mg (has no administration in time range)  ondansetron (ZOFRAN) injection 4 mg (has no administration in time range)  rosuvastatin (CRESTOR) tablet 40 mg (has no administration in time range)  heparin bolus via infusion 4,000 Units (4,000 Units Intravenous Bolus from Bag 03/08/23 2250)    Mobility walks  Focused Assessments Cardiac Assessment Handoff:  Cardiac Rhythm: Normal sinus rhythm Lab Results  Component Value Date   CKTOTAL 27 (L) 10/26/2020   No results found for: "DDIMER" Does the Patient currently have chest pain? No   , Neuro Assessment Handoff:  Swallow screen pass?  Yes  Cardiac Rhythm: Normal sinus rhythm       Neuro Assessment:   Neuro Checks:      Has TPA been given? No If patient is a Neuro Trauma and patient is going to OR before floor call report to 4N Charge nurse: [phone] or [phone]   R Recommendations: See Admitting Provider Note  Report given to:   Additional Notes: Pt A&Ox4. Continent. Ambulatory.

## 2023-03-09 NOTE — Progress Notes (Signed)
  Echocardiogram 2D Echocardiogram has been performed.  Bruce Smith 03/09/2023, 10:48 AM

## 2023-03-09 NOTE — H&P (Signed)
Cardiology Admission History and Physical   Patient ID: Bruce Smith MRN: [id number]; DOB: Mar 02, 1951   Admission date: 03/08/2023  PCP:  Elijio Miles., MD   [facility] Providers Cardiologist:  None        Chief Complaint:  chest pain  Patient Profile:   Bruce Smith is a 72 y.o. male with history of coronary disease and multiple PCI, multiple myeloma with prior stem cell transplant who is being seen 03/09/2023 for the evaluation of chest pain.  History of Present Illness:   Mr. Sickel has a history of coronary events and multiple MIs requiring PCI in the past and complex history of multiple myeloma with stem cell transplant and subsequent remission.  However he is just recent in the last year completed chemotherapy and is in remission for his multiple myeloma.  He was in his usual state of health until yesterday where he developed chest pain was progressive.  That prompted him to come in for evaluation.  In the emergency department, he was found to have elevated troponin 1279->7465.  Remaining workup was rather unremarkable.  Admitted for left heart catheterization.   Past Medical History:  Diagnosis Date   Atherosclerosis    Coronary artery disease    Goals of care, counseling/discussion 06/29/2020   History of radiation therapy    Left Humerus, T-spine, Pelvis- 09/19/22-10/04/22- Dr. Antony Blackbird   History of radiation therapy    Right Femur- 01/15/23-01/16/23- Dr. 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   Multiple myeloma (HCC) 07/21/2020   Myocardial infarction [facility])     Past Surgical History:  Procedure Laterality Date   ANTERIOR CERVICAL DECOMP/DISCECTOMY FUSION N/A 06/17/2020   Procedure: ANTERIOR CERVICAL DECOMPRESSION/DISCECTOMY FUSION, INTERBODY PROSTHESIS, PLATE/SCREWS CERVICAL FIVE- CERVICAL SIX;  Surgeon: Tressie Stalker, MD;   Location: [facility] OR;  Service: Neurosurgery;  Laterality: N/A;  ANTERIOR CERVICAL DECOMPRESSION/DISCECTOMY FUSION, INTERBODY PROSTHESIS, PLATE/SCREWS CERVICAL FIVE- CERVICAL SIX   BIOPSY  02/24/2021   Procedure: BIOPSY;  Surgeon: Kathi Der, MD;  Location: [facility] ENDOSCOPY;  Service: Gastroenterology;;   CARDIAC CATHETERIZATION     ESOPHAGOGASTRODUODENOSCOPY N/A 02/24/2021   Procedure: ESOPHAGOGASTRODUODENOSCOPY (EGD);  Surgeon: Kathi Der, MD;  Location: [facility];  Service: Gastroenterology;  Laterality: N/A;   FEMUR IM NAIL Left 07/14/2020   Procedure: OPEN REDUCTION INTERNAL FIXATION (ORIF LEFT FEMORAL NAIL FRACTURE;  Surgeon: Teryl Lucy, MD;  Location: [facility] OR;  Service: Orthopedics;  Laterality: Left;     Medications Prior to Admission: Prior to Admission medications   Medication Sig Start Date End Date Taking? Authorizing Provider  ACETAMINOPHEN ER PO Take 1,000 mg by mouth in the morning, at noon, in the evening, and at bedtime.   Yes [provider]  aspirin EC 81 MG tablet Take 81 mg by mouth at bedtime.   Yes [provider]  calcium carbonate (OS-CAL - DOSED IN MG OF ELEMENTAL CALCIUM) 1250 (500 Ca) MG tablet Take 1 tablet (500 mg of elemental calcium total) by mouth 2 (two) times daily with a meal. 03/17/21  Yes Love, Evlyn Kanner, PA-C  celecoxib (CELEBREX) 100 MG capsule TAKE 1 CAPSULE BY MOUTH 2 TIMES DAILY WITH FOOD AS NEEDED FOR PAIN & INFLAMMATION Patient taking differently: Take 100 mg by mouth 2 (two) times daily as needed (for pain and inflammation). 01/10/23  Yes Josph Macho, MD  levETIRAcetam (KEPPRA) 500 MG tablet TAKE 1 TABLET  BY MOUTH 2 TIMES A DAY 02/07/23  Yes Ennever, Rose Phi, MD  loperamide (IMODIUM) 2 MG capsule Take 1 capsule (2 mg total) by mouth as needed for diarrhea or loose stools. 03/17/21  Yes Love, Evlyn Kanner, PA-C  MAGNESIUM PO Take 100 mg by mouth daily. 03/01/22  Yes [provider]  metoprolol succinate (TOPROL-XL) 25 MG 24  hr tablet Take 25 mg by mouth at bedtime. 03/17/22  Yes [provider]  nitroGLYCERIN (NITROSTAT) 0.4 MG SL tablet Place 0.4 mg under the tongue every 5 (five) minutes as needed for chest pain. 11/11/20  Yes [provider]  ondansetron (ZOFRAN) 8 MG tablet TAKE 1 TABLET BY MOUTH EVERY 8 HOURS AS NEEDED NAUSEA AND VOMITING Patient taking differently: Take 8 mg by mouth every 8 (eight) hours as needed for nausea or vomiting. 09/28/22  Yes Antony Blackbird, MD  pantoprazole (PROTONIX) 40 MG tablet TAKE 1 TABLET BY MOUTH 2 TIMES A DAY BEFORE MEALS FOR ACID REFLUX Patient taking differently: Take 40 mg by mouth 2 (two) times daily before a meal. 01/10/23  Yes Ennever, Rose Phi, MD  polyethylene glycol powder (GLYCOLAX/MIRALAX) 17 GM/SCOOP powder Mix 17g (1 capful) with 8 oz of favorite beverage and drink every morning   Yes [provider]  pregabalin (LYRICA) 200 MG capsule TAKE 1 CAPSULE BY MOUTH 2 TIMES DAILY 02/21/23  Yes Ennever, Rose Phi, MD  prochlorperazine (COMPAZINE) 10 MG tablet Take 1 tablet (10 mg total) by mouth every 6 (six) hours as needed for nausea or vomiting. 10/05/22  Yes Ennever, Rose Phi, MD  saccharomyces boulardii (FLORASTOR) 250 MG capsule Take 250 mg by mouth daily. 03/17/21  Yes [provider]  tamsulosin (FLOMAX) 0.4 MG CAPS capsule Take 1 capsule (0.4 mg total) by mouth at bedtime. Patient taking differently: Take 0.4 mg by mouth in the morning. 12/22/22  Yes Ennever, Rose Phi, MD  traMADol (ULTRAM) 50 MG tablet TAKE TWO TABLETS BY MOUTH EVERY 6 HOURS AS NEEDED FOR PAIN Patient taking differently: Take 50 mg by mouth every 6 (six) hours. 02/21/23  Yes Ennever, Rose Phi, MD  acyclovir (ZOVIRAX) 400 MG tablet Take 1 tablet (400 mg total) by mouth 2 (two) times daily. Patient not taking: Reported on 01/25/2023 09/16/21   Josph Macho, MD  dexamethasone (DECADRON) 4 MG tablet Take two tablets (8 mg) every eight hours as needed with Zofran for  nausea. Patient not taking: Reported on 02/22/2023 10/05/22   Josph Macho, MD     Allergies:   No Known Allergies  Social History:   Social History   Socioeconomic History   Marital status: Married    Spouse name: Not on file   Number of children: Not on file   Years of education: Not on file   Highest education level: Not on file  Occupational History   Not on file  Tobacco Use   Smoking status: Former    Packs/day: 1.00    Years: 15.00    Additional pack years: 0.00    Total pack years: 15.00    Types: Cigarettes    Quit date: 10/31/1998    Years since quitting: 24.3   Smokeless tobacco: Current    Types: Chew  Vaping Use   Vaping Use: Never used  Substance and Sexual Activity   Alcohol use: Not Currently    Comment: rare   Drug use: Never   Sexual activity: Not Currently  Other Topics Concern   Not on file  Social History Narrative   Not on file   Social Determinants of Health   Financial Resource Strain: Low Risk  (10/05/2022)   Overall Financial Resource Strain (CARDIA)    Difficulty of Paying Living Expenses: Not hard at all  Food Insecurity: No Food Insecurity (03/09/2023)   Hunger Vital Sign    Worried About Running Out of Food in the Last Year: Never true    Ran Out of Food in the Last Year: Never true  Transportation Needs: No Transportation Needs (03/09/2023)   PRAPARE - Administrator, Civil Service (Medical): No    Lack of Transportation (Non-Medical): No  Physical Activity: Not on file  Stress: Not on file  Social Connections: Not on file  Intimate Partner Violence: Not At Risk (03/09/2023)   Humiliation, Afraid, Rape, and Kick questionnaire    Fear of Current or Ex-Partner: No    Emotionally Abused: No    Physically Abused: No    Sexually Abused: No    Family History:   The patient's family history includes Prostate cancer in his paternal grandfather; Stroke in his mother.    ROS:  Please see the history of present illness.  All  other ROS reviewed and negative.     Physical Exam/Data:   Vitals:   03/08/23 2251 03/09/23 0200 03/09/23 0300 03/09/23 0531  BP:  (!) 129/54 128/64 (!) 129/58  Pulse:  (!) 59 (!) 57 (!) 57  Resp:  16 14 16   Temp: 98.3 F (36.8 C) 97.9 F (36.6 C) 97.9 F (36.6 C) (!) 97.5 F (36.4 C)  TempSrc: Oral Oral Oral Oral  SpO2:  97% 95% 97%  Weight:   90.6 kg   Height:   5\' 8"  (1.727 m)     Intake/Output Summary (Last 24 hours) at 03/09/2023 0548 Last data filed at 03/09/2023 0300 Gross per 24 hour  Intake 153.61 ml  Output --  Net 153.61 ml      03/09/2023    3:00 AM 03/08/2023   10:00 PM 02/22/2023    9:29 AM  Last 3 Weights  Weight (lbs) 199 lb 11.8 oz 201 lb 4.5 oz 201 lb 6.4 oz  Weight (kg) 90.6 kg 91.3 kg 91.354 kg     Body mass index is 30.37 kg/m.  General:  , in no acute distress HEENT: normal Neck: no JVD Vascular: No carotid bruits; Distal pulses 2+ bilaterally   Cardiac:  normal S1, S2; RRR; no murmur  Lungs:  clear to auscultation bilaterally, no wheezing, rhonchi or rales  Abd: soft, nontender, no hepatomegaly  Ext: no edema Musculoskeletal:  No deformities, BUE and BLE strength normal and equal Skin: warm and dry  Neuro:  CNs 2-12 intact, no focal abnormalities noted Psych:  Normal affect    EKG:  The ECG that was done  was personally reviewed and demonstrates specific changes in sinus rhythm  Relevant CV Studies: None  Laboratory Data:  High Sensitivity Troponin:   Recent Labs  Lab 03/08/23 1932 03/08/23 2320  TROPONINIHS 1,279* 7,465*      Chemistry Recent Labs  Lab 03/08/23 1932 03/09/23 0304  NA 138 140  K 4.6 3.8  CL 106 107  CO2 20* 25  GLUCOSE 120* 105*  BUN 20 21  CREATININE 1.11 1.20  CALCIUM 9.3 9.2  GFRNONAA >60 >60  ANIONGAP 12 8    No results for input(s): "PROT", "ALBUMIN", "AST", "ALT", "ALKPHOS", "BILITOT" in the last 168 hours. Lipids  Recent Labs  Lab  03/09/23 0304  CHOL 173  TRIG 168*  HDL 41  LDLCALC 98   CHOLHDL 4.2   Hematology Recent Labs  Lab 03/08/23 1932 03/09/23 0304  WBC 6.1 4.2  RBC 4.09* 3.93*  HGB 13.1 12.5*  HCT 39.0 37.6*  MCV 95.4 95.7  MCH 32.0 31.8  MCHC 33.6 33.2  RDW 13.4 13.4  PLT 146* 133*   Thyroid  Recent Labs  Lab 03/09/23 0304  TSH 2.847  FREET4 0.74   BNP Recent Labs  Lab 03/09/23 0304  BNP 123.9*    DDimer No results for input(s): "DDIMER" in the last 168 hours.   Radiology/Studies:  DG Chest Portable 1 View  Result Date: 03/08/2023 CLINICAL DATA:  Chest pain and shortness of breath EXAM: PORTABLE CHEST 1 VIEW COMPARISON:  11/28/2020, PET-CT from 12/12/2022 FINDINGS: Cardiac shadow is within normal limits. Lungs are well aerated bilaterally. No focal infiltrate or effusion is seen. Persistent sclerotic lesion is noted within the left scapula stable from prior PET-CT. IMPRESSION: No acute abnormality noted. Electronically Signed   By: Alcide Clever M.D.   On: 03/08/2023 19:45     Assessment and Plan:   NSTEMI He notes that he has had prior PCI and known coronary disease but I do not have access to his prior catheterization at this time.  Regardless, he has typical angina that was progressive and a large troponin delta.  Will plan for left heart catheterization and optimize for secondary prevention.  Not actively on treatment for multiple myeloma.  - npo after midnight for [facility] tomorrow - echo ordered for tomorrow - heparin for anticoagulation - loaded 325 mg ASA; continue 81 mg daily  - hold off on [id number] until cath - start high intensity statin with rosuvastatin 40mg ; check lipid panel and LP(a) -Continue metoprolol 25 mg for beta blocker therapy - will check CBC, CMP, INR, hemoglobin A1c, tsh/FT4 - referral for cardiac rehab - admit for cardiac tele; strict I&Os; daily weights  - sublingual NTG PRN for pain - can escalate to nitro gtt if needed   2. Multiple Myeloma Being treated by oncology.  Not on active treatment at this time.   Continue to monitor.  3.  Seizure history Continue Keppra  4.  BPH Continue Flomax  5.  GERD Continue pantoprazole  6.  Chronic pain Continue Lyrica  Risk Assessment/Risk Scores:    TIMI Risk Score for Unstable Angina or Non-ST Elevation MI:   The patient's TIMI risk score is 6, which indicates a 41% risk of all cause mortality, new or recurrent myocardial infarction or need for urgent revascularization in the next 14 days.     Severity of Illness: The appropriate patient status for this patient is INPATIENT. Inpatient status is judged to be reasonable and necessary in order to provide the required intensity of service to ensure the patient's safety. The patient's presenting symptoms, physical exam findings, and initial radiographic and laboratory data in the context of their chronic comorbidities is felt to place them at high risk for further clinical deterioration. Furthermore, it is not anticipated that the patient will be medically stable for discharge from the hospital within 2 midnights of admission.   * I certify that at the point of admission it is my clinical judgment that the patient will require inpatient hospital care spanning beyond 2 midnights from the point of admission due to high intensity of service, high risk for further deterioration and high frequency of surveillance required.*   For questions or updates,  please contact [facility] Please consult [email or web address] for contact info under     Signed, Joellen Jersey, MD  03/09/2023 5:48 AM

## 2023-03-09 NOTE — H&P (View-Only) (Signed)
Rounding Note    Patient Name: Bruce Smith Date of Encounter: 03/09/2023  [facility] Cardiologist: None   Subjective   No chest pain, family at the bedside.   Inpatient Medications    Scheduled Meds:  aspirin EC  81 mg Oral QHS   calcium carbonate  1 tablet Oral BID WC   levETIRAcetam  500 mg Oral BID   magnesium oxide  200 mg Oral Daily   metoprolol succinate  25 mg Oral Daily   pantoprazole  40 mg Oral BID AC   pregabalin  200 mg Oral BID   rosuvastatin  40 mg Oral Daily   tamsulosin  0.4 mg Oral Daily   Continuous Infusions:  sodium chloride 50 mL/hr at 03/09/23 0638   heparin 1,300 Units/hr (03/09/23 0600)   nitroGLYCERIN Stopped (03/09/23 0012)   PRN Meds: acetaminophen, nitroGLYCERIN, ondansetron (ZOFRAN) IV   Vital Signs    Vitals:   03/09/23 0200 03/09/23 0300 03/09/23 0531 03/09/23 0730  BP: (!) 129/54 128/64 (!) 129/58 (!) 142/61  Pulse: (!) 59 (!) 57 (!) 57 68  Resp: 16 14 16 16   Temp: 97.9 F (36.6 C) 97.9 F (36.6 C) (!) 97.5 F (36.4 C) 97.8 F (36.6 C)  TempSrc: Oral Oral Oral Oral  SpO2: 97% 95% 97% 94%  Weight:  90.6 kg    Height:  5\' 8"  (1.727 m)      Intake/Output Summary (Last 24 hours) at 03/09/2023 0743 Last data filed at 03/09/2023 0600 Gross per 24 hour  Intake 192.57 ml  Output --  Net 192.57 ml      03/09/2023    3:00 AM 03/08/2023   10:00 PM 02/22/2023    9:29 AM  Last 3 Weights  Weight (lbs) 199 lb 11.8 oz 201 lb 4.5 oz 201 lb 6.4 oz  Weight (kg) 90.6 kg 91.3 kg 91.354 kg      Telemetry    Sinus Rhythm - Personally Reviewed  ECG    Sinus Rhythm, 61 bpm - Personally Reviewed  Physical Exam   GEN: No acute distress.   Neck: No JVD Cardiac: RRR, no murmurs, rubs, or gallops.  Respiratory: Clear to auscultation bilaterally. GI: Soft, nontender, non-distended  MS: No edema; No deformity. Neuro:  Nonfocal  Psych: Normal affect   Labs    High Sensitivity Troponin:   Recent Labs  Lab 03/08/23 1932  03/08/23 2320  TROPONINIHS 1,279* 7,465*     Chemistry Recent Labs  Lab 03/08/23 1932 03/09/23 0304  NA 138 140  K 4.6 3.8  CL 106 107  CO2 20* 25  GLUCOSE 120* 105*  BUN 20 21  CREATININE 1.11 1.20  CALCIUM 9.3 9.2  GFRNONAA >60 >60  ANIONGAP 12 8    Lipids  Recent Labs  Lab 03/09/23 0304  CHOL 173  TRIG 168*  HDL 41  LDLCALC 98  CHOLHDL 4.2    Hematology Recent Labs  Lab 03/08/23 1932 03/09/23 0304  WBC 6.1 4.2  RBC 4.09* 3.93*  HGB 13.1 12.5*  HCT 39.0 37.6*  MCV 95.4 95.7  MCH 32.0 31.8  MCHC 33.6 33.2  RDW 13.4 13.4  PLT 146* 133*   Thyroid  Recent Labs  Lab 03/09/23 0304  TSH 2.847  FREET4 0.74    BNP Recent Labs  Lab 03/09/23 0304  BNP 123.9*    DDimer No results for input(s): "DDIMER" in the last 168 hours.   Radiology    DG Chest Portable 1 View  Result  Date: 03/08/2023 CLINICAL DATA:  Chest pain and shortness of breath EXAM: PORTABLE CHEST 1 VIEW COMPARISON:  11/28/2020, PET-CT from 12/12/2022 FINDINGS: Cardiac shadow is within normal limits. Lungs are well aerated bilaterally. No focal infiltrate or effusion is seen. Persistent sclerotic lesion is noted within the left scapula stable from prior PET-CT. IMPRESSION: No acute abnormality noted. Electronically Signed   By: Alcide Clever M.D.   On: 03/08/2023 19:45    Cardiac Studies   Cath (Care Everywhere)04/2016  Diagnostic Summary  RCA and LAD stents patent.  50% in LAD stent narrowing as before.  Ostial Cx 50% as before.  PDA has mid 90% stenosis.  Normal LV function. EF 70%  Diagnostic Recommendations  PCI recommended after discussion with patient.  Interventional Summary  2.25x15mm resolute to PDA.  Interventional Recommendations  Dual anti-platelet therapy with Ticagrelor and Aspirin 81 mg for at least 12  months is recommended.   Signatures   Electronically signed by Merrily Pew, MD(Interventional   Physician) on 05/09/2016 08:48   Patient Profile     72 y.o. male  with PMH of CAD s/p prior RCA, LAD and PDA stents, HTN, HLD, multiple myeloma with prior stem cell transplant who presented with chest pain and elevated troponins.   Assessment & Plan    NSTEMI CAD s/p prior stents to RCA, LAD and PDA -- [facility] with patent RCA and LAD stents, 90% mid PDA stenosis treated with PCI/DES.  He was treated with aspirin and Brilinta.  -- Presents back with typical anginal symptoms similar to what he is experienced in the past.  High-sensitivity troponin 1279>> 7465.  EKG with no acute ST/T wave abnormalities. -- Currently on IV heparin, pain-free -- echo pending -- Plan for cardiac catheterization today  Shared Decision Making/Informed Consent The risks [stroke (1 in 1000), death (1 in 1000), kidney failure [usually temporary] (1 in 500), bleeding (1 in 200), allergic reaction [possibly serious] (1 in 200)], benefits (diagnostic support and management of coronary artery disease) and alternatives of a cardiac catheterization were discussed in detail with Mr. Mcgrann and he is willing to proceed.  HTN -- Continue metoprolol XL 25 mg daily  Hyperlipidemia -- LDL 98, HDL 41 -- Started on Crestor 40 mg daily on admission  Multiple myeloma Prior stem cell transplant  For questions or updates, please contact [facility] Please consult [email or web address] for contact info under        Signed, Laverda Page, NP  03/09/2023, 7:43 AM    I have examined the patient and reviewed assessment and plan and discussed with patient.  Agree with above as stated.    Prior caths/PCI done at [facility].  Dr. Chales Abrahams and Dr. Heron Nay.  Plan for cath today.  All questions answered.  2+ right radial pulse.  Prior caths have been done from the radial approach.   Lance Muss

## 2023-03-09 NOTE — Progress Notes (Addendum)
 Rounding Note    Patient Name: Bruce Smith Date of Encounter: 03/09/2023  [facility] Cardiologist: None   Subjective   No chest pain, family at the bedside.   Inpatient Medications    Scheduled Meds:  aspirin EC  81 mg Oral QHS   calcium carbonate  1 tablet Oral BID WC   levETIRAcetam  500 mg Oral BID   magnesium oxide  200 mg Oral Daily   metoprolol succinate  25 mg Oral Daily   pantoprazole  40 mg Oral BID AC   pregabalin  200 mg Oral BID   rosuvastatin  40 mg Oral Daily   tamsulosin  0.4 mg Oral Daily   Continuous Infusions:  sodium chloride 50 mL/hr at 03/09/23 0638   heparin 1,300 Units/hr (03/09/23 0600)   nitroGLYCERIN Stopped (03/09/23 0012)   PRN Meds: acetaminophen, nitroGLYCERIN, ondansetron (ZOFRAN) IV   Vital Signs    Vitals:   03/09/23 0200 03/09/23 0300 03/09/23 0531 03/09/23 0730  BP: (!) 129/54 128/64 (!) 129/58 (!) 142/61  Pulse: (!) 59 (!) 57 (!) 57 68  Resp: 16 14 16 16  Temp: 97.9 F (36.6 C) 97.9 F (36.6 C) (!) 97.5 F (36.4 C) 97.8 F (36.6 C)  TempSrc: Oral Oral Oral Oral  SpO2: 97% 95% 97% 94%  Weight:  90.6 kg    Height:  5' 8" (1.727 m)      Intake/Output Summary (Last 24 hours) at 03/09/2023 0743 Last data filed at 03/09/2023 0600 Gross per 24 hour  Intake 192.57 ml  Output --  Net 192.57 ml      03/09/2023    3:00 AM 03/08/2023   10:00 PM 02/22/2023    9:29 AM  Last 3 Weights  Weight (lbs) 199 lb 11.8 oz 201 lb 4.5 oz 201 lb 6.4 oz  Weight (kg) 90.6 kg 91.3 kg 91.354 kg      Telemetry    Sinus Rhythm - Personally Reviewed  ECG    Sinus Rhythm, 61 bpm - Personally Reviewed  Physical Exam   GEN: No acute distress.   Neck: No JVD Cardiac: RRR, no murmurs, rubs, or gallops.  Respiratory: Clear to auscultation bilaterally. GI: Soft, nontender, non-distended  MS: No edema; No deformity. Neuro:  Nonfocal  Psych: Normal affect   Labs    High Sensitivity Troponin:   Recent Labs  Lab 03/08/23 1932  03/08/23 2320  TROPONINIHS 1,279* 7,465*     Chemistry Recent Labs  Lab 03/08/23 1932 03/09/23 0304  NA 138 140  K 4.6 3.8  CL 106 107  CO2 20* 25  GLUCOSE 120* 105*  BUN 20 21  CREATININE 1.11 1.20  CALCIUM 9.3 9.2  GFRNONAA >60 >60  ANIONGAP 12 8    Lipids  Recent Labs  Lab 03/09/23 0304  CHOL 173  TRIG 168*  HDL 41  LDLCALC 98  CHOLHDL 4.2    Hematology Recent Labs  Lab 03/08/23 1932 03/09/23 0304  WBC 6.1 4.2  RBC 4.09* 3.93*  HGB 13.1 12.5*  HCT 39.0 37.6*  MCV 95.4 95.7  MCH 32.0 31.8  MCHC 33.6 33.2  RDW 13.4 13.4  PLT 146* 133*   Thyroid  Recent Labs  Lab 03/09/23 0304  TSH 2.847  FREET4 0.74    BNP Recent Labs  Lab 03/09/23 0304  BNP 123.9*    DDimer No results for input(s): "DDIMER" in the last 168 hours.   Radiology    DG Chest Portable 1 View  Result   Date: 03/08/2023 CLINICAL DATA:  Chest pain and shortness of breath EXAM: PORTABLE CHEST 1 VIEW COMPARISON:  11/28/2020, PET-CT from 12/12/2022 FINDINGS: Cardiac shadow is within normal limits. Lungs are well aerated bilaterally. No focal infiltrate or effusion is seen. Persistent sclerotic lesion is noted within the left scapula stable from prior PET-CT. IMPRESSION: No acute abnormality noted. Electronically Signed   By: Mark  Lukens M.D.   On: 03/08/2023 19:45    Cardiac Studies   Cath (Care Everywhere)04/2016  Diagnostic Summary  RCA and LAD stents patent.  50% in LAD stent narrowing as before.  Ostial Cx 50% as before.  PDA has mid 90% stenosis.  Normal LV function. EF 70%  Diagnostic Recommendations  PCI recommended after discussion with patient.  Interventional Summary  2.25x14mm resolute to PDA.  Interventional Recommendations  Dual anti-platelet therapy with Ticagrelor and Aspirin 81 mg for at least 12  months is recommended.   Signatures   Electronically signed by Darryl Kalil, MD(Interventional   Physician) on 05/09/2016 08:48   Patient Profile     72 y.o. male  with PMH of CAD s/p prior RCA, LAD and PDA stents, HTN, HLD, multiple myeloma with prior stem cell transplant who presented with chest pain and elevated troponins.   Assessment & Plan    NSTEMI CAD s/p prior stents to RCA, LAD and PDA -- [facility] with patent RCA and LAD stents, 90% mid PDA stenosis treated with PCI/DES.  He was treated with aspirin and Brilinta.  -- Presents back with typical anginal symptoms similar to what he is experienced in the past.  High-sensitivity troponin 1279>> 7465.  EKG with no acute ST/T wave abnormalities. -- Currently on IV heparin, pain-free -- echo pending -- Plan for cardiac catheterization today  Shared Decision Making/Informed Consent The risks [stroke (1 in 1000), death (1 in 1000), kidney failure [usually temporary] (1 in 500), bleeding (1 in 200), allergic reaction [possibly serious] (1 in 200)], benefits (diagnostic support and management of coronary artery disease) and alternatives of a cardiac catheterization were discussed in detail with Mr. Lusignan and he is willing to proceed.  HTN -- Continue metoprolol XL 25 mg daily  Hyperlipidemia -- LDL 98, HDL 41 -- Started on Crestor 40 mg daily on admission  Multiple myeloma Prior stem cell transplant  For questions or updates, please contact [facility] Please consult [email or web address] for contact info under        Signed, Lindsay Roberts, NP  03/09/2023, 7:43 AM    I have examined the patient and reviewed assessment and plan and discussed with patient.  Agree with above as stated.    Prior caths/PCI done at [place].  Dr. Tyson and Dr. Khalil.  Plan for cath today.  All questions answered.  2+ right radial pulse.  Prior caths have been done from the radial approach.   Kahealani Yankovich   

## 2023-03-09 NOTE — Interval H&P Note (Signed)
Cath Lab Visit (complete for each Cath Lab visit)  Clinical Evaluation Leading to the Procedure:   ACS: Yes.    Non-ACS:    Anginal Classification: CCS II  Anti-ischemic medical therapy: Maximal Therapy (2 or more classes of medications)  Non-Invasive Test Results: No non-invasive testing performed  Prior CABG: No previous CABG      History and Physical Interval Note:  03/09/2023 1:55 PM  Bruce Smith  has presented today for surgery, with the diagnosis of nstemi.  The various methods of treatment have been discussed with the patient and family. After consideration of risks, benefits and other options for treatment, the patient has consented to  Procedure(s): LEFT HEART CATH AND CORONARY ANGIOGRAPHY (N/A) as a surgical intervention.  The patient's history has been reviewed, patient examined, no change in status, stable for surgery.  I have reviewed the patient's chart and labs.  Questions were answered to the patient's satisfaction.     Bruce Smith

## 2023-03-10 ENCOUNTER — Other Ambulatory Visit: Payer: Self-pay

## 2023-03-10 ENCOUNTER — Inpatient Hospital Stay (HOSPITAL_COMMUNITY): Payer: Medicare Other

## 2023-03-10 ENCOUNTER — Encounter (HOSPITAL_COMMUNITY): Payer: Self-pay | Admitting: Cardiovascular Disease

## 2023-03-10 DIAGNOSIS — I2510 Atherosclerotic heart disease of native coronary artery without angina pectoris: Secondary | ICD-10-CM | POA: Diagnosis not present

## 2023-03-10 DIAGNOSIS — I214 Non-ST elevation (NSTEMI) myocardial infarction: Secondary | ICD-10-CM | POA: Diagnosis not present

## 2023-03-10 LAB — PULMONARY FUNCTION TEST
FEF 25-75 Pre: 1.23 L/sec
FEF2575-%Pred-Pre: 54 %
FEV1-%Pred-Pre: 82 %
FEV1-Pre: 2.44 L
FEV1FVC-%Pred-Pre: 89 %
FEV6-%Pred-Pre: 93 %
FEV6-Pre: 3.56 L
FEV6FVC-%Pred-Pre: 102 %
FVC-%Pred-Pre: 91 %
FVC-Pre: 3.71 L
Pre FEV1/FVC ratio: 66 %
Pre FEV6/FVC Ratio: 96 %

## 2023-03-10 LAB — BASIC METABOLIC PANEL
Anion gap: 9 (ref 5–15)
BUN: 15 mg/dL (ref 8–23)
CO2: 21 mmol/L — ABNORMAL LOW (ref 22–32)
Calcium: 8.5 mg/dL — ABNORMAL LOW (ref 8.9–10.3)
Chloride: 111 mmol/L (ref 98–111)
Creatinine, Ser: 1.08 mg/dL (ref 0.61–1.24)
GFR, Estimated: >60 mL/min (ref >60)
Glucose, Bld: 123 mg/dL — ABNORMAL HIGH (ref 70–99)
Potassium: 3.4 mmol/L — ABNORMAL LOW (ref 3.5–5.1)
Sodium: 141 mmol/L (ref 135–145)

## 2023-03-10 LAB — CBC
HCT: 37.5 % — ABNORMAL LOW (ref 39.0–52.0)
Hemoglobin: 12.6 g/dL — ABNORMAL LOW (ref 13.0–17.0)
MCH: 32.1 pg (ref 26.0–34.0)
MCHC: 33.6 g/dL (ref 30.0–36.0)
MCV: 95.4 fL (ref 80.0–100.0)
Platelets: 136 10*3/uL — ABNORMAL LOW (ref 150–400)
RBC: 3.93 MIL/uL — ABNORMAL LOW (ref 4.22–5.81)
RDW: 13.5 % (ref 11.5–15.5)
WBC: 5 10*3/uL (ref 4.0–10.5)
nRBC: 0 % (ref 0.0–0.2)

## 2023-03-10 LAB — HEPARIN LEVEL (UNFRACTIONATED): Heparin Unfractionated: 0.35 IU/mL (ref 0.30–0.70)

## 2023-03-10 MED ORDER — HEPARIN (PORCINE) 25000 UT/250ML-% IV SOLN
1300.0000 [IU]/h | INTRAVENOUS | Status: DC
  Administered 2023-03-10 – 2023-03-12 (×4): 1300 [IU]/h via INTRAVENOUS
  Filled 2023-03-10 (×5): qty 250

## 2023-03-10 MED ORDER — MAGNESIUM SULFATE 50 % IJ SOLN
40.0000 meq | INTRAMUSCULAR | Status: DC
  Filled 2023-03-10: qty 9.85

## 2023-03-10 MED ORDER — TRAMADOL HCL 50 MG PO TABS: 50.0000 mg | ORAL_TABLET | Freq: Four times a day (QID) | ORAL | Status: DC

## 2023-03-10 MED ORDER — INSULIN REGULAR(HUMAN) IN NACL 100-0.9 UT/100ML-% IV SOLN
INTRAVENOUS | Status: AC
  Administered 2023-03-13: .9 [IU]/h via INTRAVENOUS
  Filled 2023-03-10: qty 100

## 2023-03-10 MED ORDER — HEPARIN 30,000 UNITS/1000 ML (OHS) CELLSAVER SOLUTION
Status: DC
  Filled 2023-03-10: qty 1000

## 2023-03-10 MED ORDER — PLASMA-LYTE A IV SOLN
INTRAVENOUS | Status: DC
  Filled 2023-03-10: qty 2.5

## 2023-03-10 MED ORDER — NOREPINEPHRINE 4 MG/250ML-% IV SOLN
0.0000 ug/min | INTRAVENOUS | Status: DC
  Filled 2023-03-10: qty 250

## 2023-03-10 MED ORDER — POTASSIUM CHLORIDE 2 MEQ/ML IV SOLN
80.0000 meq | INTRAVENOUS | Status: DC
  Filled 2023-03-10: qty 40

## 2023-03-10 MED ORDER — EPINEPHRINE HCL 5 MG/250ML IV SOLN IN NS
0.0000 ug/min | INTRAVENOUS | Status: DC
  Filled 2023-03-10: qty 250

## 2023-03-10 MED ORDER — PHENYLEPHRINE HCL-NACL 20-0.9 MG/250ML-% IV SOLN
30.0000 ug/min | INTRAVENOUS | Status: AC
  Administered 2023-03-13: 15 ug/min via INTRAVENOUS
  Filled 2023-03-10: qty 250

## 2023-03-10 MED ORDER — ACETAMINOPHEN 500 MG PO TABS
1000.0000 mg | ORAL_TABLET | Freq: Three times a day (TID) | ORAL | Status: DC
  Administered 2023-03-10 – 2023-03-12 (×11): 1000 mg via ORAL
  Filled 2023-03-10 (×12): qty 2

## 2023-03-10 MED ORDER — NITROGLYCERIN IN D5W 200-5 MCG/ML-% IV SOLN
2.0000 ug/min | INTRAVENOUS | Status: AC
  Administered 2023-03-13: 15 ug/min via INTRAVENOUS
  Filled 2023-03-10: qty 250

## 2023-03-10 MED ORDER — DEXMEDETOMIDINE HCL IN NACL 400 MCG/100ML IV SOLN
0.1000 ug/kg/h | INTRAVENOUS | Status: AC
  Administered 2023-03-13: .5 ug/kg/h via INTRAVENOUS
  Filled 2023-03-10: qty 100

## 2023-03-10 MED ORDER — POTASSIUM CHLORIDE CRYS ER 20 MEQ PO TBCR
40.0000 meq | EXTENDED_RELEASE_TABLET | Freq: Once | ORAL | Status: AC
  Administered 2023-03-10: 40 meq via ORAL
  Filled 2023-03-10: qty 2

## 2023-03-10 MED ORDER — ISOSORBIDE MONONITRATE ER 30 MG PO TB24
30.0000 mg | ORAL_TABLET | Freq: Every day | ORAL | Status: DC
  Administered 2023-03-10 – 2023-03-12 (×3): 30 mg via ORAL
  Filled 2023-03-10 (×3): qty 1

## 2023-03-10 MED ORDER — MILRINONE LACTATE IN DEXTROSE 20-5 MG/100ML-% IV SOLN
0.3000 ug/kg/min | INTRAVENOUS | Status: DC
  Filled 2023-03-10: qty 100

## 2023-03-10 MED ORDER — TRANEXAMIC ACID (OHS) PUMP PRIME SOLUTION
2.0000 mg/kg | INTRAVENOUS | Status: DC
  Filled 2023-03-10: qty 1.84

## 2023-03-10 MED ORDER — SACCHAROMYCES BOULARDII 250 MG PO CAPS
250.0000 mg | ORAL_CAPSULE | Freq: Every day | ORAL | Status: DC
  Administered 2023-03-10 – 2023-03-22 (×12): 250 mg via ORAL
  Filled 2023-03-10 (×12): qty 1

## 2023-03-10 MED ORDER — CEFAZOLIN SODIUM-DEXTROSE 2-4 GM/100ML-% IV SOLN
2.0000 g | INTRAVENOUS | Status: DC
  Filled 2023-03-10: qty 100

## 2023-03-10 MED ORDER — POLYETHYLENE GLYCOL 3350 17 GM/SCOOP PO POWD
1.0000 | Freq: Every day | ORAL | Status: DC
  Filled 2023-03-10: qty 255

## 2023-03-10 MED ORDER — VANCOMYCIN HCL 1500 MG/300ML IV SOLN
1500.0000 mg | INTRAVENOUS | Status: AC
  Administered 2023-03-13: 1500 mg via INTRAVENOUS
  Filled 2023-03-10: qty 300

## 2023-03-10 MED ORDER — POLYETHYLENE GLYCOL 3350 17 GM/SCOOP PO POWD: 17.0000 g | Freq: Every day | ORAL | Status: DC | PRN

## 2023-03-10 MED ORDER — TRANEXAMIC ACID 1000 MG/10ML IV SOLN
1.5000 mg/kg/h | INTRAVENOUS | Status: AC
  Administered 2023-03-13: 1.5 mg/kg/h via INTRAVENOUS
  Filled 2023-03-10: qty 25

## 2023-03-10 MED ORDER — TRAMADOL HCL 50 MG PO TABS
50.0000 mg | ORAL_TABLET | Freq: Three times a day (TID) | ORAL | Status: DC
  Administered 2023-03-10 – 2023-03-13 (×12): 50 mg via ORAL
  Filled 2023-03-10 (×12): qty 1

## 2023-03-10 MED ORDER — CEFAZOLIN SODIUM-DEXTROSE 2-4 GM/100ML-% IV SOLN
2.0000 g | INTRAVENOUS | Status: AC
  Administered 2023-03-13: 2 g via INTRAVENOUS
  Filled 2023-03-10: qty 100

## 2023-03-10 MED ORDER — TRANEXAMIC ACID (OHS) BOLUS VIA INFUSION
15.0000 mg/kg | INTRAVENOUS | Status: AC
  Administered 2023-03-13: 1377 mg via INTRAVENOUS
  Filled 2023-03-10: qty 1377

## 2023-03-10 NOTE — Progress Notes (Signed)
ANTICOAGULATION CONSULT NOTE   Pharmacy Consult for heparin Indication: chest pain/ACS  No Known Allergies  Patient Measurements: Height: 5\' 8"  (172.7 cm) Weight: 91.8 kg (202 lb 6.1 oz) IBW/kg (Calculated) : 68.4 Heparin Dosing Weight: 87.2 kg  Vital Signs: Temp: 98.2 F (36.8 C) (05/10 1220) Temp Source: Oral (05/10 1220) BP: 140/56 (05/10 1220) Pulse Rate: 69 (05/10 0828)  Labs: Recent Labs    03/08/23 1932 03/08/23 2320 03/09/23 0304 03/09/23 0715 03/10/23 0102 03/10/23 1831  HGB 13.1  --  12.5*  --  12.6*  --   HCT 39.0  --  37.6*  --  37.5*  --   PLT 146*  --  133*  --  136*  --   LABPROT  --   --  13.3  --   --   --   INR  --   --  1.0  --   --   --   HEPARINUNFRC  --   --   --  0.54  --  0.35  CREATININE 1.11  --  1.20  --  1.08  --   TROPONINIHS 1,279* 7,465*  --   --   --   --      Estimated Creatinine Clearance: 69 mL/min (by C-G formula based on SCr of 1.08 mg/dL).   Medical History: Past Medical History:  Diagnosis Date   Atherosclerosis    Coronary artery disease    Goals of care, counseling/discussion 06/29/2020   History of radiation therapy    Left Humerus, T-spine, Pelvis- 09/19/22-10/04/22- Dr. Antony Blackbird   History of radiation therapy    Right Femur- 01/15/23-01/16/23- Dr. 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   Multiple myeloma (HCC) 07/21/2020   Myocardial infarction (HCC)     Medications:   Scheduled:   acetaminophen  1,000 mg Oral TID AC & HS   aspirin  81 mg Oral Daily   calcium carbonate  1 tablet Oral BID WC   [START ON 03/13/2023] epinephrine  0-10 mcg/min Intravenous To OR   [START ON 03/13/2023] heparin sodium (porcine) 2,500 Units, papaverine 30 mg in electrolyte-A (PLASMALYTE-A PH 7.4) 500 mL irrigation   Irrigation To OR   [START ON 03/13/2023] insulin   Intravenous To OR   isosorbide mononitrate  30  mg Oral Daily   levETIRAcetam  500 mg Oral BID   magnesium oxide  200 mg Oral Daily   [START ON 03/13/2023] magnesium sulfate  40 mEq Other To OR   metoprolol succinate  25 mg Oral Daily   pantoprazole  40 mg Oral BID AC   [START ON 03/13/2023] phenylephrine  30-200 mcg/min Intravenous To OR   [START ON 03/13/2023] potassium chloride  80 mEq Other To OR   pregabalin  200 mg Oral BID   rosuvastatin  40 mg Oral Daily   saccharomyces boulardii  250 mg Oral Daily   sodium chloride flush  3 mL Intravenous Q12H   tamsulosin  0.4 mg Oral Daily   traMADol  50 mg Oral TID AC & HS   [START ON 03/13/2023] tranexamic acid  15 mg/kg Intravenous To OR   [START ON 03/13/2023] tranexamic acid  2 mg/kg Intracatheter To OR   Infusions:   sodium chloride     [START ON 03/13/2023]  ceFAZolin (ANCEF) IV     [START ON 03/13/2023]  ceFAZolin (ANCEF) IV     [  START ON 03/13/2023] dexmedetomidine     [START ON 03/13/2023] heparin 30,000 units/NS 1000 mL solution for CELLSAVER     heparin 1,300 Units/hr (03/10/23 1207)   [START ON 03/13/2023] milrinone     nitroGLYCERIN Stopped (03/09/23 0012)   [START ON 03/13/2023] nitroGLYCERIN     [START ON 03/13/2023] norepinephrine     [START ON 03/13/2023] tranexamic acid (CYKLOKAPRON) 2,500 mg in sodium chloride 0.9 % 250 mL (10 mg/mL) infusion     [START ON 03/13/2023] vancomycin      Assessment: 71 yom presenting with chest pain. Previous MI s/p 3 stents. No anticoag PTA. S/p cath yesterday with diffuse CAD, pharmacy asked to resume IV heparin. Awaiting CVTS consult.  PM update - heparin level remains therapeutic at 0.35. CBC stable. No bleed issues reported.  Goal of Therapy:  Heparin level 0.3-0.7 units/ml Monitor platelets by anticoagulation protocol: Yes   Plan:  Continue IV heparin at 1300 units/hr.   Confirmatory heparin level with AM labs Monitor daily CBC, s/sx bleeding F/u CVTS consult   Leia Alf, PharmD, BCPS Please check AMION for all [facility]  contact numbers Clinical Pharmacist 03/10/2023 8:04 PM

## 2023-03-10 NOTE — Progress Notes (Addendum)
Rounding Note    Patient Name: Bruce Smith Date of Encounter: 03/10/2023  [facility] Cardiologist: Lance Muss, MD   Subjective   No chest pain, no SOB  On ultram   Inpatient Medications    Scheduled Meds:  acetaminophen  1,000 mg Oral TID AC & HS   aspirin  81 mg Oral Daily   calcium carbonate  1 tablet Oral BID WC   levETIRAcetam  500 mg Oral BID   magnesium oxide  200 mg Oral Daily   metoprolol succinate  25 mg Oral Daily   pantoprazole  40 mg Oral BID AC   pregabalin  200 mg Oral BID   rosuvastatin  40 mg Oral Daily   saccharomyces boulardii  250 mg Oral Daily   sodium chloride flush  3 mL Intravenous Q12H   tamsulosin  0.4 mg Oral Daily   traMADol  50 mg Oral TID AC & HS   Continuous Infusions:  sodium chloride     heparin 1,300 Units/hr (03/10/23 0928)   nitroGLYCERIN Stopped (03/09/23 0012)   PRN Meds: sodium chloride, morphine injection, nitroGLYCERIN, ondansetron (ZOFRAN) IV, polyethylene glycol powder, sodium chloride flush   Vital Signs    Vitals:   03/09/23 1935 03/10/23 0020 03/10/23 0432 03/10/23 0828  BP: (!) 120/50 (!) 131/57 (!) 125/52 (!) 133/57  Pulse: 65 75 68 69  Resp: 19 14 19 18   Temp: 99 F (37.2 C) 98.4 F (36.9 C) 98.7 F (37.1 C) 99.1 F (37.3 C)  TempSrc: Oral Oral Oral Oral  SpO2: 97% 95% 95% 95%  Weight:   91.8 kg   Height:        Intake/Output Summary (Last 24 hours) at 03/10/2023 0935 Last data filed at 03/10/2023 0840 Gross per 24 hour  Intake 1115.12 ml  Output --  Net 1115.12 ml      03/10/2023    4:32 AM 03/09/2023    3:00 AM 03/08/2023   10:00 PM  Last 3 Weights  Weight (lbs) 202 lb 6.1 oz 199 lb 11.8 oz 201 lb 4.5 oz  Weight (kg) 91.8 kg 90.6 kg 91.3 kg      Telemetry    SR - Personally Reviewed  ECG    No new - Personally Reviewed  Physical Exam   GEN: No acute distress. Bruising over arms chronic  Neck: No JVD Cardiac: RRR, no murmurs, rubs, or gallops. Rt wrist cath site  without hematoma  Respiratory: Clear to auscultation bilaterally. GI: Soft, nontender, non-distended  MS: No edema; No deformity. Neuro:  Nonfocal  Psych: Normal affect   Labs    High Sensitivity Troponin:   Recent Labs  Lab 03/08/23 1932 03/08/23 2320  TROPONINIHS 1,279* 7,465*     Chemistry Recent Labs  Lab 03/08/23 1932 03/09/23 0304 03/10/23 0102  NA 138 140 141  K 4.6 3.8 3.4*  CL 106 107 111  CO2 20* 25 21*  GLUCOSE 120* 105* 123*  BUN 20 21 15   CREATININE 1.11 1.20 1.08  CALCIUM 9.3 9.2 8.5*  GFRNONAA >60 >60 >60  ANIONGAP 12 8 9     Lipids  Recent Labs  Lab 03/09/23 0304  CHOL 173  TRIG 168*  HDL 41  LDLCALC 98  CHOLHDL 4.2    Hematology Recent Labs  Lab 03/08/23 1932 03/09/23 0304 03/10/23 0102  WBC 6.1 4.2 5.0  RBC 4.09* 3.93* 3.93*  HGB 13.1 12.5* 12.6*  HCT 39.0 37.6* 37.5*  MCV 95.4 95.7 95.4  MCH 32.0 31.8  32.1  MCHC 33.6 33.2 33.6  RDW 13.4 13.4 13.5  PLT 146* 133* 136*   Thyroid  Recent Labs  Lab 03/09/23 0304  TSH 2.847  FREET4 0.74    BNP Recent Labs  Lab 03/09/23 0304  BNP 123.9*    DDimer No results for input(s): "DDIMER" in the last 168 hours.   Radiology    ECHOCARDIOGRAM COMPLETE  Result Date: 03/09/2023    ECHOCARDIOGRAM REPORT   Patient Name:   Bruce Smith Date of Exam: 03/09/2023 Medical Rec #:  [id number]    Height:       68.0 in Accession #:    [id number]   Weight:       199.7 lb Date of Birth:  09/04/1951     BSA:          2.043 m Patient Age:    71 years     BP:           142/61 mmHg Patient Gender: M            HR:           57 bpm. Exam Location:  Inpatient Procedure: 2D Echo, Cardiac Doppler, Color Doppler and Intracardiac            Opacification Agent Indications:    122-I22.9 Subsequent ST elevation (STEM) and non-ST elevation                 (NSTEMI) myocardial infarction  History:        Patient has prior history of Echocardiogram examinations, most                 recent 10/30/2020. Acute MI and CAD; Risk  Factors:Former Smoker                 and Dyslipidemia. Metastatic disease.  Sonographer:    Sheralyn Boatman RDCS Referring Phys: Maurine Minister NARCISSE JR IMPRESSIONS  1. Left ventricular ejection fraction, by estimation, is 55 to 60%. The left ventricle has normal function. The left ventricle demonstrates regional wall motion abnormalities (see scoring diagram/findings for description). There is mild left ventricular  hypertrophy. Left ventricular diastolic parameters were grossly normal.  2. Right ventricular systolic function is normal. The right ventricular size is normal. There is normal pulmonary artery systolic pressure. The estimated right ventricular systolic pressure is 20.1 mmHg.  3. The mitral valve is grossly normal. Trivial mitral valve regurgitation. No evidence of mitral stenosis.  4. The aortic valve is tricuspid. There is mild calcification of the aortic valve. Aortic valve regurgitation is not visualized. No aortic stenosis is present.  5. The inferior vena cava is normal in size with greater than 50% respiratory variability, suggesting right atrial pressure of 3 mmHg. FINDINGS  Left Ventricle: Left ventricular ejection fraction, by estimation, is 55 to 60%. The left ventricle has normal function. The left ventricle demonstrates regional wall motion abnormalities. Definity contrast agent was given IV to delineate the left ventricular endocardial borders. The left ventricular internal cavity size was normal in size. There is mild left ventricular hypertrophy. Left ventricular diastolic parameters were normal.  LV Wall Scoring: The apical lateral segment, apical septal segment, and apical anterior segment are hypokinetic. Right Ventricle: The right ventricular size is normal. No increase in right ventricular wall thickness. Right ventricular systolic function is normal. There is normal pulmonary artery systolic pressure. The tricuspid regurgitant velocity is 2.07 m/s, and  with an assumed right atrial pressure  of 3 mmHg, the estimated right ventricular  systolic pressure is 20.1 mmHg. Left Atrium: Left atrial size was normal in size. Right Atrium: Right atrial size was normal in size. Pericardium: Trivial pericardial effusion is present. Mitral Valve: The mitral valve is grossly normal. Trivial mitral valve regurgitation. No evidence of mitral valve stenosis. Tricuspid Valve: The tricuspid valve is normal in structure. Tricuspid valve regurgitation is mild . No evidence of tricuspid stenosis. Aortic Valve: The aortic valve is tricuspid. There is mild calcification of the aortic valve. Aortic valve regurgitation is not visualized. No aortic stenosis is present. Pulmonic Valve: The pulmonic valve was normal in structure. Pulmonic valve regurgitation is trivial. No evidence of pulmonic stenosis. Aorta: The aortic root is normal in size and structure. Venous: The inferior vena cava is normal in size with greater than 50% respiratory variability, suggesting right atrial pressure of 3 mmHg. IAS/Shunts: No atrial level shunt detected by color flow Doppler.  LEFT VENTRICLE PLAX 2D LVIDd:         5.05 cm      Diastology LVIDs:         3.00 cm      LV e' medial:    6.85 cm/s LV PW:         0.95 cm      LV E/e' medial:  11.8 LV IVS:        1.10 cm      LV e' lateral:   8.75 cm/s LVOT diam:     2.30 cm      LV E/e' lateral: 9.2 LV SV:         99 LV SV Index:   48 LVOT Area:     4.15 cm  LV Volumes (MOD) LV vol d, MOD A2C: 150.0 ml LV vol d, MOD A4C: 151.0 ml LV vol s, MOD A2C: 70.5 ml LV vol s, MOD A4C: 57.8 ml LV SV MOD A2C:     79.5 ml LV SV MOD A4C:     151.0 ml LV SV MOD BP:      89.5 ml RIGHT VENTRICLE             IVC RV S prime:     11.30 cm/s  IVC diam: 2.10 cm TAPSE (M-mode): 2.0 cm LEFT ATRIUM             Index        RIGHT ATRIUM          Index LA diam:        2.80 cm 1.37 cm/m   RA Area:     8.65 cm LA Vol (A2C):   30.0 ml 14.69 ml/m  RA Volume:   14.20 ml 6.95 ml/m LA Vol (A4C):   24.6 ml 12.04 ml/m LA Biplane Vol:  29.8 ml 14.59 ml/m  AORTIC VALVE             PULMONIC VALVE LVOT Vmax:   103.00 cm/s PR End Diast Vel: 1.47 msec LVOT Vmean:  63.400 cm/s LVOT VTI:    0.238 m  AORTA Ao Root diam: 3.50 cm Ao Asc diam:  3.20 cm MITRAL VALVE               TRICUSPID VALVE MV Area (PHT): 2.45 cm    TR Peak grad:   17.1 mmHg MV Decel Time: 310 msec    TR Vmax:        207.00 cm/s MV E velocity: 80.70 cm/s MV A velocity: 73.40 cm/s  SHUNTS MV E/A ratio:  1.10  Systemic VTI:  0.24 m                            Systemic Diam: 2.30 cm Weston Brass MD Electronically signed by Weston Brass MD Signature Date/Time: 03/09/2023/2:52:27 PM    Final    CARDIAC CATHETERIZATION  Result Date: 03/09/2023 Images from the original result were not included.   Ost LM to Mid LM lesion is 60% stenosed.   Mid LAD lesion is 90% stenosed.   Ost Cx lesion is 60% stenosed.   1st Mrg lesion is 90% stenosed.   Mid Cx to Dist Cx lesion is 90% stenosed.   3rd Mrg lesion is 80% stenosed.   Previously placed Prox RCA to Mid RCA stent of unknown type is  widely patent.   The left ventricular systolic function is normal.   LV end diastolic pressure is mildly elevated.   The left ventricular ejection fraction is 50-55% by visual estimate. Bruce Smith is a 72 y.o. male  [id number] LOCATION:  FACILITY: [facility] PHYSICIAN: [name], M.D. 1951/06/22 DATE OF PROCEDURE:  03/09/2023 DATE OF DISCHARGE: CARDIAC CATHETERIZATION History obtained from chart review.72 y.o. male with PMH of CAD s/p prior RCA, LAD and PDA stents, HTN, HLD, multiple myeloma with prior stem cell transplant who presented with chest pain and elevated troponins.   Mr. Deutschman presented with a non-STEMI with troponins in the 7000 range.  He status post LAD, RCA and distal circumflex stenting remotely.  His cath revealed a moderate distal left main with diffuse in-stent restenosis within the mid LAD stent as well as disease in the first marginal branch, second marginal branch in the AV groove  circumflex.  The distal stent in the PLA branch is occluded.  The stent in the right right right coronary artery is widely patent.  His anatomy is suitable for CABG.  The guidewire and catheter were removed.  The sheath was removed and a TR band was placed on the right wrist to achieve patent hemostasis.  The patient left lab in stable condition.  He will be started back on heparin 4 hours after sheath removal without bolus.  T CTS will be consulted.  The angiograms were reviewed with Dr. Excell Seltzer as well. [name]. MD, [facility] 03/09/2023 2:46 PM    DG Chest Portable 1 View  Result Date: 03/08/2023 CLINICAL DATA:  Chest pain and shortness of breath EXAM: PORTABLE CHEST 1 VIEW COMPARISON:  11/28/2020, PET-CT from 12/12/2022 FINDINGS: Cardiac shadow is within normal limits. Lungs are well aerated bilaterally. No focal infiltrate or effusion is seen. Persistent sclerotic lesion is noted within the left scapula stable from prior PET-CT. IMPRESSION: No acute abnormality noted. Electronically Signed   By: Alcide Clever M.D.   On: 03/08/2023 19:45    Cardiac Studies   Cardiac Cath 03/09/23    Ost LM to Mid LM lesion is 60% stenosed.   Mid LAD lesion is 90% stenosed.   Ost Cx lesion is 60% stenosed.   1st Mrg lesion is 90% stenosed.   Mid Cx to Dist Cx lesion is 90% stenosed.   3rd Mrg lesion is 80% stenosed.   Previously placed Prox RCA to Mid RCA stent of unknown type is  widely patent.   The left ventricular systolic function is normal.   LV end diastolic pressure is mildly elevated.   The left ventricular ejection fraction is 50-55% by visual estimate.     IMPRESSION: Mr. Hladky presented  with a non-STEMI with troponins in the 7000 range.  He status post LAD, RCA and distal circumflex stenting remotely.  His cath revealed a moderate distal left main with diffuse in-stent restenosis within the mid LAD stent as well as disease in the first marginal branch, second marginal branch in the AV groove  circumflex.  The distal stent in the PLA branch is occluded.  The stent in the right right right coronary artery is widely patent.  His anatomy is suitable for CABG.  The guidewire and catheter were removed.  The sheath was removed and a TR band was placed on the right wrist to achieve patent hemostasis.  The patient left lab in stable condition.  He will be started back on heparin 4 hours after sheath removal without bolus.  T CTS will be consulted.  The angiograms were reviewed with Dr. Excell Seltzer as well.   Diagnostic Dominance: Co-dominant  IMPRESSIONS     1. Left ventricular ejection fraction, by estimation, is 55 to 60%. The  left ventricle has normal function. The left ventricle demonstrates  regional wall motion abnormalities (see scoring diagram/findings for  description). There is mild left ventricular   hypertrophy. Left ventricular diastolic parameters were grossly normal.   2. Right ventricular systolic function is normal. The right ventricular  size is normal. There is normal pulmonary artery systolic pressure. The  estimated right ventricular systolic pressure is 20.1 mmHg.   3. The mitral valve is grossly normal. Trivial mitral valve  regurgitation. No evidence of mitral stenosis.   4. The aortic valve is tricuspid. There is mild calcification of the  aortic valve. Aortic valve regurgitation is not visualized. No aortic  stenosis is present.   5. The inferior vena cava is normal in size with greater than 50%  respiratory variability, suggesting right atrial pressure of 3 mmHg.   FINDINGS   Left Ventricle: Left ventricular ejection fraction, by estimation, is 55  to 60%. The left ventricle has normal function. The left ventricle  demonstrates regional wall motion abnormalities. Definity contrast agent  was given IV to delineate the left  ventricular endocardial borders. The left ventricular internal cavity size  was normal in size. There is mild left ventricular hypertrophy.  Left  ventricular diastolic parameters were normal.     LV Wall Scoring:  The apical lateral segment, apical septal segment, and apical anterior  segment are hypokinetic.   Right Ventricle: The right ventricular size is normal. No increase in  right ventricular wall thickness. Right ventricular systolic function is  normal. There is normal pulmonary artery systolic pressure. The tricuspid  regurgitant velocity is 2.07 m/s, and   with an assumed right atrial pressure of 3 mmHg, the estimated right  ventricular systolic pressure is 20.1 mmHg.   Left Atrium: Left atrial size was normal in size.   Right Atrium: Right atrial size was normal in size.   Pericardium: Trivial pericardial effusion is present.   Mitral Valve: The mitral valve is grossly normal. Trivial mitral valve  regurgitation. No evidence of mitral valve stenosis.   Tricuspid Valve: The tricuspid valve is normal in structure. Tricuspid  valve regurgitation is mild . No evidence of tricuspid stenosis.   Aortic Valve: The aortic valve is tricuspid. There is mild calcification  of the aortic valve. Aortic valve regurgitation is not visualized. No  aortic stenosis is present.   Pulmonic Valve: The pulmonic valve was normal in structure. Pulmonic valve  regurgitation is trivial. No evidence of pulmonic stenosis.  Aorta: The aortic root is normal in size and structure.   Venous: The inferior vena cava is normal in size with greater than 50%  respiratory variability, suggesting right atrial pressure of 3 mmHg.   IAS/Shunts: No atrial level shunt detected by color flow Doppler.       Patient Profile     72 y.o. male with PMH of CAD s/p prior RCA, LAD and PDA stents, HTN, HLD, multiple myeloma with prior stem cell transplant who presented with chest pain and elevated troponins.   Assessment & Plan    NSTEMI/CAD with prior stents to RCA. LAD and PDA --cardiac cath with 60% LM, 90% LAD, LCX 90%  RCA stent previously  placed is patent.  EF 50-55% --TCTS consult - dopplers pending --IV heparin resumed.  --IV NTG weaning  --Echo with EF 55-60%   Diarrhea --on enteric precautions -c-diff is pending pt takes miralax at home and it causes diarrhea made PRN here --ultram causes constipation so on   HTN  --toprol 25 daily  HLD --LDL 98 HDL 41  --crestor 40 daily   Multiple myeloma --prior stem cell transplant  --initially successful but now with recurrence of myeloma at 2 sites in pelvis.  They have been irradiated and taken care of .  --chronic Lt shoulder pain on tylenol and ultram - have reordered   Chronic pain Lt shoulder pain --on tylenol and ultram re-odered AC and HS    For questions or updates, please contact [facility] Please consult [email or web address] for contact info under        Signed, Nada Boozer, NP  03/10/2023, 9:35 AM    I have examined the patient and reviewed assessment and plan and discussed with patient.  Agree with above as stated.    Feels well.  On IV heparin post cath with multivessel disease.  Right radial site stable.  Await surgical evaluation.  Echocardiogram has been done.  Dopplers ordered.  He does have other medical issues but I discussed his activity level with the family.  He remains quite active.  He gets around well with a cane at home.  Has not been taking clopidogrel.  Had an episode of diarrhea last night but no recent antibiotic use.  Lance Muss

## 2023-03-10 NOTE — Consult Note (Signed)
[address]       [place] [address]             [phone]      Cardiothoracic Surgery Consultation  Reason for Consult: Severe multivessel coronary artery disease Referring Physician: Dr. [name]  Bruce Smith is an 72 y.o. male.  HPI:   The patient is a 72 year old gentleman with a hx of HTN, HLD, multiple myeloma with prior stem cell transplant and recurrence treated with chemotherapy felt to be in remission, and CAD s/p multiple prior PCI's in the LAD, OM, and PDA. He reports being in his usual state of health until the day of admission when he developed pain his his chest with radiation to the neck, jaw and arms. Troponin was 1279, increasing to 7465. ECG showed non-specific changes. His chest pain resolved and has not recurred. Cath shows severe 3 vessel CAD with an EF of 50-55% and mildly elevated LVEDP of 17 mm Hg. Echo showed an EF of 55-60% with no valvular abnormality.  His wife is with him today. He reports that he has not had any chest, neck, jaw or arm pain prior to this episode. He had some jaw pain in the past before his previous stents. He denies shortness of breath. He had some diaphoresis during this current episode of pain. He stays active but can not walk that far due to peripheral neuropathy. He uses a cane for stability.  Past Medical History:  Diagnosis Date   Atherosclerosis    Coronary artery disease    Goals of care, counseling/discussion 06/29/2020   History of radiation therapy    Left Humerus, T-spine, Pelvis- 09/19/22-10/04/22- Dr. Antony Blackbird   History of radiation therapy    Right Femur- 01/15/23-01/16/23- Dr. 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   Multiple myeloma (HCC) 07/21/2020   Myocardial infarction [facility])     Past Surgical History:  Procedure Laterality Date   ANTERIOR CERVICAL  DECOMP/DISCECTOMY FUSION N/A 06/17/2020   Procedure: ANTERIOR CERVICAL DECOMPRESSION/DISCECTOMY FUSION, INTERBODY PROSTHESIS, PLATE/SCREWS CERVICAL FIVE- CERVICAL SIX;  Surgeon: Tressie Stalker, MD;  Location: [facility] OR;  Service: Neurosurgery;  Laterality: N/A;  ANTERIOR CERVICAL DECOMPRESSION/DISCECTOMY FUSION, INTERBODY PROSTHESIS, PLATE/SCREWS CERVICAL FIVE- CERVICAL SIX   BIOPSY  02/24/2021   Procedure: BIOPSY;  Surgeon: Kathi Der, MD;  Location: [facility] ENDOSCOPY;  Service: Gastroenterology;;   CARDIAC CATHETERIZATION     ESOPHAGOGASTRODUODENOSCOPY N/A 02/24/2021   Procedure: ESOPHAGOGASTRODUODENOSCOPY (EGD);  Surgeon: Kathi Der, MD;  Location: [facility];  Service: Gastroenterology;  Laterality: N/A;   FEMUR IM NAIL Left 07/14/2020   Procedure: OPEN REDUCTION INTERNAL FIXATION (ORIF LEFT FEMORAL NAIL FRACTURE;  Surgeon: Teryl Lucy, MD;  Location: [facility] OR;  Service: Orthopedics;  Laterality: Left;   LEFT HEART CATH AND CORONARY ANGIOGRAPHY N/A 03/09/2023   Procedure: LEFT HEART CATH AND CORONARY ANGIOGRAPHY;  Surgeon: Runell Gess, MD;  Location: [facility] INVASIVE CV LAB;  Service: Cardiovascular;  Laterality: N/A;    Family History  Problem Relation Age of Onset   Stroke Mother    Prostate cancer Paternal Grandfather     Social History:  reports that he quit smoking about 24 years ago. His smoking use included cigarettes. He has a 15.00 pack-year smoking history. His smokeless tobacco use includes chew. He reports that he does not currently use alcohol. He reports that he does  not use drugs.  Allergies: No Known Allergies  Medications: I have reviewed the patient's current medications. Prior to Admission:  Medications Prior to Admission  Medication Sig Dispense Refill Last Dose   ACETAMINOPHEN ER PO Take 1,000 mg by mouth in the morning, at noon, in the evening, and at bedtime.   03/08/2023 at noon   aspirin EC 81 MG tablet Take 81 mg by mouth at bedtime.   03/07/2023 at pm    calcium carbonate (OS-CAL - DOSED IN MG OF ELEMENTAL CALCIUM) 1250 (500 Ca) MG tablet Take 1 tablet (500 mg of elemental calcium total) by mouth 2 (two) times daily with a meal. 60 tablet 0 03/08/2023 at am   celecoxib (CELEBREX) 100 MG capsule TAKE 1 CAPSULE BY MOUTH 2 TIMES DAILY WITH FOOD AS NEEDED FOR PAIN & INFLAMMATION (Patient taking differently: Take 100 mg by mouth 2 (two) times daily as needed (for pain and inflammation).) 180 capsule 0 03/08/2023 at am   levETIRAcetam (KEPPRA) 500 MG tablet TAKE 1 TABLET BY MOUTH 2 TIMES A DAY 60 tablet 3 03/08/2023 at am   loperamide (IMODIUM) 2 MG capsule Take 1 capsule (2 mg total) by mouth as needed for diarrhea or loose stools. 30 capsule 0 UNKNOWN   MAGNESIUM PO Take 100 mg by mouth daily.   03/08/2023 at am   metoprolol succinate (TOPROL-XL) 25 MG 24 hr tablet Take 25 mg by mouth at bedtime.   03/07/2023 at 1900   nitroGLYCERIN (NITROSTAT) 0.4 MG SL tablet Place 0.4 mg under the tongue every 5 (five) minutes as needed for chest pain.   03/08/2023 at 1630   ondansetron (ZOFRAN) 8 MG tablet TAKE 1 TABLET BY MOUTH EVERY 8 HOURS AS NEEDED NAUSEA AND VOMITING (Patient taking differently: Take 8 mg by mouth every 8 (eight) hours as needed for nausea or vomiting.) 180 tablet 0 Past Week   pantoprazole (PROTONIX) 40 MG tablet TAKE 1 TABLET BY MOUTH 2 TIMES A DAY BEFORE MEALS FOR ACID REFLUX (Patient taking differently: Take 40 mg by mouth 2 (two) times daily before a meal.) 180 tablet 0 03/08/2023 at am   polyethylene glycol powder (GLYCOLAX/MIRALAX) 17 GM/SCOOP powder Mix 17g (1 capful) with 8 oz of favorite beverage and drink every morning   03/08/2023 at am   pregabalin (LYRICA) 200 MG capsule TAKE 1 CAPSULE BY MOUTH 2 TIMES DAILY 180 capsule 0 03/08/2023 at am   prochlorperazine (COMPAZINE) 10 MG tablet Take 1 tablet (10 mg total) by mouth every 6 (six) hours as needed for nausea or vomiting. 30 tablet 3 UNKNOWN   saccharomyces boulardii (FLORASTOR) 250 MG capsule Take 250  mg by mouth daily.   03/08/2023 at am   tamsulosin (FLOMAX) 0.4 MG CAPS capsule Take 1 capsule (0.4 mg total) by mouth at bedtime. (Patient taking differently: Take 0.4 mg by mouth in the morning.) 90 capsule 0 03/08/2023 at am   traMADol (ULTRAM) 50 MG tablet TAKE TWO TABLETS BY MOUTH EVERY 6 HOURS AS NEEDED FOR PAIN (Patient taking differently: Take 50 mg by mouth every 6 (six) hours.) 240 tablet 0 03/08/2023 at 1500   acyclovir (ZOVIRAX) 400 MG tablet Take 1 tablet (400 mg total) by mouth 2 (two) times daily. (Patient not taking: Reported on 01/25/2023) 180 tablet 0 Not Taking   dexamethasone (DECADRON) 4 MG tablet Take two tablets (8 mg) every eight hours as needed with Zofran for nausea. (Patient not taking: Reported on 02/22/2023) 60 tablet 3 Not Taking   Scheduled:  acetaminophen  1,000 mg Oral TID AC & HS   aspirin  81 mg Oral Daily   calcium carbonate  1 tablet Oral BID WC   [START ON 03/13/2023] epinephrine  0-10 mcg/min Intravenous To OR   [START ON 03/13/2023] heparin sodium (porcine) 2,500 Units, papaverine 30 mg in electrolyte-A (PLASMALYTE-A PH 7.4) 500 mL irrigation   Irrigation To OR   [START ON 03/13/2023] insulin   Intravenous To OR   isosorbide mononitrate  30 mg Oral Daily   levETIRAcetam  500 mg Oral BID   magnesium oxide  200 mg Oral Daily   [START ON 03/13/2023] magnesium sulfate  40 mEq Other To OR   metoprolol succinate  25 mg Oral Daily   pantoprazole  40 mg Oral BID AC   [START ON 03/13/2023] phenylephrine  30-200 mcg/min Intravenous To OR   [START ON 03/13/2023] potassium chloride  80 mEq Other To OR   pregabalin  200 mg Oral BID   rosuvastatin  40 mg Oral Daily   saccharomyces boulardii  250 mg Oral Daily   sodium chloride flush  3 mL Intravenous Q12H   tamsulosin  0.4 mg Oral Daily   traMADol  50 mg Oral TID AC & HS   [START ON 03/13/2023] tranexamic acid  15 mg/kg Intravenous To OR   [START ON 03/13/2023] tranexamic acid  2 mg/kg Intracatheter To OR   Continuous:   sodium chloride     [START ON 03/13/2023]  ceFAZolin (ANCEF) IV     [START ON 03/13/2023]  ceFAZolin (ANCEF) IV     [START ON 03/13/2023] dexmedetomidine     [START ON 03/13/2023] heparin 30,000 units/NS 1000 mL solution for CELLSAVER     heparin 1,300 Units/hr (03/10/23 1207)   [START ON 03/13/2023] milrinone     nitroGLYCERIN Stopped (03/09/23 0012)   [START ON 03/13/2023] nitroGLYCERIN     [START ON 03/13/2023] norepinephrine     [START ON 03/13/2023] tranexamic acid (CYKLOKAPRON) 2,500 mg in sodium chloride 0.9 % 250 mL (10 mg/mL) infusion     [START ON 03/13/2023] vancomycin     AVW:[id number] chloride, morphine injection, nitroGLYCERIN, ondansetron (ZOFRAN) IV, polyethylene glycol powder, sodium chloride flush Anti-infectives (From admission, onward)    Start     Dose/Rate Route Frequency Ordered Stop   03/13/23 0400  vancomycin (VANCOREADY) IVPB 1500 mg/300 mL        1,500 mg 150 mL/hr over 120 Minutes Intravenous To Surgery 03/10/23 1319 03/14/23 0400   03/13/23 0400  ceFAZolin (ANCEF) IVPB 2g/100 mL premix        2 g 200 mL/hr over 30 Minutes Intravenous To Surgery 03/10/23 1319 03/14/23 0400   03/13/23 0400  ceFAZolin (ANCEF) IVPB 2g/100 mL premix        2 g 200 mL/hr over 30 Minutes Intravenous To Surgery 03/10/23 1319 03/14/23 0400       Results for orders placed or performed during the hospital encounter of 03/08/23 (from the past 48 hour(s))  CBG monitoring, ED     Status: Abnormal   Collection Time: 03/08/23  7:28 PM  Result Value Ref Range   Glucose-Capillary 116 (H) 70 - 99 mg/dL    Comment: Glucose reference range applies only to samples taken after fasting for at least 8 hours.  Basic metabolic panel     Status: Abnormal   Collection Time: 03/08/23  7:32 PM  Result Value Ref Range   Sodium 138 135 - 145 mmol/L   Potassium 4.6 3.5 - 5.1  mmol/L    Comment: HEMOLYSIS AT THIS LEVEL MAY AFFECT RESULT   Chloride 106 98 - 111 mmol/L   CO2 20 (L) 22 - 32 mmol/L   Glucose,  Bld 120 (H) 70 - 99 mg/dL    Comment: Glucose reference range applies only to samples taken after fasting for at least 8 hours.   BUN 20 8 - 23 mg/dL   Creatinine, Ser 1.30 0.61 - 1.24 mg/dL   Calcium 9.3 8.9 - 86.5 mg/dL   GFR, Estimated >78 >46 mL/min    Comment: (NOTE) Calculated using the CKD-EPI Creatinine Equation (2021)    Anion gap 12 5 - 15    Comment: Performed at [facility], [address], [place], [place] [address]  Troponin I (High Sensitivity)     Status: Abnormal   Collection Time: 03/08/23  7:32 PM  Result Value Ref Range   Troponin I (High Sensitivity) 1,279 (HH) <18 ng/L    Comment: CRITICAL RESULT CALLED TO, READ BACK BY AND VERIFIED WITH P. BLANCHARD RN 03/08/23 @2055  BY J. WHITE (NOTE) Elevated high sensitivity troponin I (hsTnI) values and significant  changes across serial measurements may suggest ACS but many other  chronic and acute conditions are known to elevate hsTnI results.  Refer to the "Links" section for chest pain algorithms and additional  guidance. Performed at [facility], [address] [place], [place] [address]   CBC     Status: Abnormal   Collection Time: 03/08/23  7:32 PM  Result Value Ref Range   WBC 6.1 4.0 - 10.5 K/uL   RBC 4.09 (L) 4.22 - 5.81 MIL/uL   Hemoglobin 13.1 13.0 - 17.0 g/dL   HCT 24.4 01.0 - 27.2 %   MCV 95.4 80.0 - 100.0 fL   MCH 32.0 26.0 - 34.0 pg   MCHC 33.6 30.0 - 36.0 g/dL   RDW 53.6 64.4 - 03.4 %   Platelets 146 (L) 150 - 400 K/uL   nRBC 0.0 0.0 - 0.2 %    Comment: Performed at [facility], [address] [place], [place] [address]  Troponin I (High Sensitivity)     Status: Abnormal   Collection Time: 03/08/23 11:20 PM  Result Value Ref Range   Troponin I (High Sensitivity) 7,465 (HH) <18 ng/L    Comment: CRITICAL VALUE NOTED. VALUE IS CONSISTENT WITH PREVIOUSLY REPORTED/CALLED VALUE (NOTE) Elevated high sensitivity troponin I (hsTnI) values and significant  changes across serial  measurements may suggest ACS but many other  chronic and acute conditions are known to elevate hsTnI results.  Refer to the "Links" section for chest pain algorithms and additional  guidance. Performed at [facility], [address], [place], [place] [address]   CBC     Status: Abnormal   Collection Time: 03/09/23  3:04 AM  Result Value Ref Range   WBC 4.2 4.0 - 10.5 K/uL   RBC 3.93 (L) 4.22 - 5.81 MIL/uL   Hemoglobin 12.5 (L) 13.0 - 17.0 g/dL   HCT 56.4 (L) 33.2 - 95.1 %   MCV 95.7 80.0 - 100.0 fL   MCH 31.8 26.0 - 34.0 pg   MCHC 33.2 30.0 - 36.0 g/dL   RDW 88.4 16.6 - 06.3 %   Platelets 133 (L) 150 - 400 K/uL   nRBC 0.0 0.0 - 0.2 %    Comment: Performed at [facility] Lab, [address], [place], [place] [address]  Basic metabolic panel  Status: Abnormal   Collection Time: 03/09/23  3:04 AM  Result Value Ref Range   Sodium 140 135 - 145 mmol/L   Potassium 3.8 3.5 - 5.1 mmol/L   Chloride 107 98 - 111 mmol/L   CO2 25 22 - 32 mmol/L   Glucose, Bld 105 (H) 70 - 99 mg/dL    Comment: Glucose reference range applies only to samples taken after fasting for at least 8 hours.   BUN 21 8 - 23 mg/dL   Creatinine, Ser 1.61 0.61 - 1.24 mg/dL   Calcium 9.2 8.9 - 09.6 mg/dL   GFR, Estimated >04 >54 mL/min    Comment: (NOTE) Calculated using the CKD-EPI Creatinine Equation (2021)    Anion gap 8 5 - 15    Comment: Performed at [facility], [address], [place], [place] [address]  Lipid panel     Status: Abnormal   Collection Time: 03/09/23  3:04 AM  Result Value Ref Range   Cholesterol 173 0 - 200 mg/dL   Triglycerides 914 (H) <150 mg/dL   HDL 41 >78 mg/dL   Total CHOL/HDL Ratio 4.2 RATIO   VLDL 34 0 - 40 mg/dL   LDL Cholesterol 98 0 - 99 mg/dL    Comment:        Total Cholesterol/HDL:CHD Risk Coronary Heart Disease Risk Table                     Men   Women  1/2 Average Risk   3.4   3.3  Average Risk       5.0   4.4  2 X Average Risk   9.6   7.1  3 X  Average Risk  23.4   11.0        Use the calculated Patient Ratio above and the CHD Risk Table to determine the patient's CHD Risk.        ATP III CLASSIFICATION (LDL):  <100     mg/dL   Optimal  295-621  mg/dL   Near or Above                    Optimal  130-159  mg/dL   Borderline  308-657  mg/dL   High  >846     mg/dL   Very High Performed at [facility], [address] [place], [place] [address]   Protime-INR     Status: None   Collection Time: 03/09/23  3:04 AM  Result Value Ref Range   Prothrombin Time 13.3 11.4 - 15.2 seconds   INR 1.0 0.8 - 1.2    Comment: (NOTE) INR goal varies based on device and disease states. Performed at [facility], [address] [place], [place] [address]   TSH     Status: None   Collection Time: 03/09/23  3:04 AM  Result Value Ref Range   TSH 2.847 0.350 - 4.500 uIU/mL    Comment: Performed by a 3rd Generation assay with a functional sensitivity of <=0.01 uIU/mL. Performed at [facility], [address] [place], [place] [address]   T4, free     Status: None   Collection Time: 03/09/23  3:04 AM  Result Value Ref Range   Free T4 0.74 0.61 - 1.12 ng/dL    Comment: (NOTE) Biotin ingestion may interfere with free T4 tests. If the results are inconsistent with the TSH level, previous test results, or the clinical presentation, then  consider biotin interference. If needed, order repeat testing after stopping biotin. Performed at [facility], [address] [place], [place] [address]   Hemoglobin A1c     Status: None   Collection Time: 03/09/23  3:04 AM  Result Value Ref Range   Hgb A1c MFr Bld 5.5 4.8 - 5.6 %    Comment: (NOTE) Pre diabetes:          5.7%-6.4%  Diabetes:              >6.4%  Glycemic control for   <7.0% adults with diabetes    Mean Plasma Glucose 111.15 mg/dL    Comment: Performed at [facility], [address], [place], [place] [address]  Brain natriuretic peptide     Status:  Abnormal   Collection Time: 03/09/23  3:04 AM  Result Value Ref Range   B Natriuretic Peptide 123.9 (H) 0.0 - 100.0 pg/mL    Comment: Performed at [facility] Lab, [address] [place], [place] [address]  Heparin level (unfractionated)     Status: None   Collection Time: 03/09/23  7:15 AM  Result Value Ref Range   Heparin Unfractionated 0.54 0.30 - 0.70 IU/mL    Comment: (NOTE) The clinical reportable range upper limit is being lowered to >1.10 to align with the FDA approved guidance for the current laboratory assay.  If heparin results are below expected values, and patient dosage has  been confirmed, suggest follow up testing of antithrombin III levels. Performed at [facility], [address] [place], [place] [address]   CBC     Status: Abnormal   Collection Time: 03/10/23  1:02 AM  Result Value Ref Range   WBC 5.0 4.0 - 10.5 K/uL   RBC 3.93 (L) 4.22 - 5.81 MIL/uL   Hemoglobin 12.6 (L) 13.0 - 17.0 g/dL   HCT 29.5 (L) 62.1 - 30.8 %   MCV 95.4 80.0 - 100.0 fL   MCH 32.1 26.0 - 34.0 pg   MCHC 33.6 30.0 - 36.0 g/dL   RDW 65.7 84.6 - 96.2 %   Platelets 136 (L) 150 - 400 K/uL   nRBC 0.0 0.0 - 0.2 %    Comment: Performed at [facility], [address], [place], [place] [address]  Basic metabolic panel     Status: Abnormal   Collection Time: 03/10/23  1:02 AM  Result Value Ref Range   Sodium 141 135 - 145 mmol/L   Potassium 3.4 (L) 3.5 - 5.1 mmol/L   Chloride 111 98 - 111 mmol/L   CO2 21 (L) 22 - 32 mmol/L   Glucose, Bld 123 (H) 70 - 99 mg/dL    Comment: Glucose reference range applies only to samples taken after fasting for at least 8 hours.   BUN 15 8 - 23 mg/dL   Creatinine, Ser 1.32 0.61 - 1.24 mg/dL   Calcium 8.5 (L) 8.9 - 10.3 mg/dL   GFR, Estimated >44 >01 mL/min    Comment: (NOTE) Calculated using the CKD-EPI Creatinine Equation (2021)    Anion gap 9 5 - 15    Comment: Performed at [facility], [address] [place], [place] [address]     ECHOCARDIOGRAM COMPLETE  Result Date: 03/09/2023    ECHOCARDIOGRAM REPORT   Patient Name:   Bruce Smith Date of Exam: 03/09/2023 Medical Rec #:  [id number]    Height:       68.0 in Accession #:  [id number]   Weight:       199.7 lb Date of Birth:  1950-12-28     BSA:          2.043 m Patient Age:    71 years     BP:           142/61 mmHg Patient Gender: M            HR:           57 bpm. Exam Location:  Inpatient Procedure: 2D Echo, Cardiac Doppler, Color Doppler and Intracardiac            Opacification Agent Indications:    122-I22.9 Subsequent ST elevation (STEM) and non-ST elevation                 (NSTEMI) myocardial infarction  History:        Patient has prior history of Echocardiogram examinations, most                 recent 10/30/2020. Acute MI and CAD; Risk Factors:Former Smoker                 and Dyslipidemia. Metastatic disease.  Sonographer:    Sheralyn Boatman RDCS Referring Phys: Maurine Minister NARCISSE JR IMPRESSIONS  1. Left ventricular ejection fraction, by estimation, is 55 to 60%. The left ventricle has normal function. The left ventricle demonstrates regional wall motion abnormalities (see scoring diagram/findings for description). There is mild left ventricular  hypertrophy. Left ventricular diastolic parameters were grossly normal.  2. Right ventricular systolic function is normal. The right ventricular size is normal. There is normal pulmonary artery systolic pressure. The estimated right ventricular systolic pressure is 20.1 mmHg.  3. The mitral valve is grossly normal. Trivial mitral valve regurgitation. No evidence of mitral stenosis.  4. The aortic valve is tricuspid. There is mild calcification of the aortic valve. Aortic valve regurgitation is not visualized. No aortic stenosis is present.  5. The inferior vena cava is normal in size with greater than 50% respiratory variability, suggesting right atrial pressure of 3 mmHg. FINDINGS  Left Ventricle: Left ventricular ejection fraction, by  estimation, is 55 to 60%. The left ventricle has normal function. The left ventricle demonstrates regional wall motion abnormalities. Definity contrast agent was given IV to delineate the left ventricular endocardial borders. The left ventricular internal cavity size was normal in size. There is mild left ventricular hypertrophy. Left ventricular diastolic parameters were normal.  LV Wall Scoring: The apical lateral segment, apical septal segment, and apical anterior segment are hypokinetic. Right Ventricle: The right ventricular size is normal. No increase in right ventricular wall thickness. Right ventricular systolic function is normal. There is normal pulmonary artery systolic pressure. The tricuspid regurgitant velocity is 2.07 m/s, and  with an assumed right atrial pressure of 3 mmHg, the estimated right ventricular systolic pressure is 20.1 mmHg. Left Atrium: Left atrial size was normal in size. Right Atrium: Right atrial size was normal in size. Pericardium: Trivial pericardial effusion is present. Mitral Valve: The mitral valve is grossly normal. Trivial mitral valve regurgitation. No evidence of mitral valve stenosis. Tricuspid Valve: The tricuspid valve is normal in structure. Tricuspid valve regurgitation is mild . No evidence of tricuspid stenosis. Aortic Valve: The aortic valve is tricuspid. There is mild calcification of the aortic valve. Aortic valve regurgitation is not visualized. No aortic stenosis is present. Pulmonic Valve: The pulmonic valve was normal in structure. Pulmonic valve regurgitation is trivial. No evidence of pulmonic  stenosis. Aorta: The aortic root is normal in size and structure. Venous: The inferior vena cava is normal in size with greater than 50% respiratory variability, suggesting right atrial pressure of 3 mmHg. IAS/Shunts: No atrial level shunt detected by color flow Doppler.  LEFT VENTRICLE PLAX 2D LVIDd:         5.05 cm      Diastology LVIDs:         3.00 cm      LV e'  medial:    6.85 cm/s LV PW:         0.95 cm      LV E/e' medial:  11.8 LV IVS:        1.10 cm      LV e' lateral:   8.75 cm/s LVOT diam:     2.30 cm      LV E/e' lateral: 9.2 LV SV:         99 LV SV Index:   48 LVOT Area:     4.15 cm  LV Volumes (MOD) LV vol d, MOD A2C: 150.0 ml LV vol d, MOD A4C: 151.0 ml LV vol s, MOD A2C: 70.5 ml LV vol s, MOD A4C: 57.8 ml LV SV MOD A2C:     79.5 ml LV SV MOD A4C:     151.0 ml LV SV MOD BP:      89.5 ml RIGHT VENTRICLE             IVC RV S prime:     11.30 cm/s  IVC diam: 2.10 cm TAPSE (M-mode): 2.0 cm LEFT ATRIUM             Index        RIGHT ATRIUM          Index LA diam:        2.80 cm 1.37 cm/m   RA Area:     8.65 cm LA Vol (A2C):   30.0 ml 14.69 ml/m  RA Volume:   14.20 ml 6.95 ml/m LA Vol (A4C):   24.6 ml 12.04 ml/m LA Biplane Vol: 29.8 ml 14.59 ml/m  AORTIC VALVE             PULMONIC VALVE LVOT Vmax:   103.00 cm/s PR End Diast Vel: 1.47 msec LVOT Vmean:  63.400 cm/s LVOT VTI:    0.238 m  AORTA Ao Root diam: 3.50 cm Ao Asc diam:  3.20 cm MITRAL VALVE               TRICUSPID VALVE MV Area (PHT): 2.45 cm    TR Peak grad:   17.1 mmHg MV Decel Time: 310 msec    TR Vmax:        207.00 cm/s MV E velocity: 80.70 cm/s MV A velocity: 73.40 cm/s  SHUNTS MV E/A ratio:  1.10        Systemic VTI:  0.24 m                            Systemic Diam: 2.30 cm Weston Brass MD Electronically signed by Weston Brass MD Signature Date/Time: 03/09/2023/2:52:27 PM    Final    CARDIAC CATHETERIZATION  Result Date: 03/09/2023 Images from the original result were not included.   Ost LM to Mid LM lesion is 60% stenosed.   Mid LAD lesion is 90% stenosed.   Ost Cx lesion is 60% stenosed.   1st Mrg lesion is 90% stenosed.  Mid Cx to Dist Cx lesion is 90% stenosed.   3rd Mrg lesion is 80% stenosed.   Previously placed Prox RCA to Mid RCA stent of unknown type is  widely patent.   The left ventricular systolic function is normal.   LV end diastolic pressure is mildly elevated.   The left  ventricular ejection fraction is 50-55% by visual estimate. Bruce Smith is a 72 y.o. male  [id number] LOCATION:  FACILITY: [facility] PHYSICIAN: [name], M.D. 1951-08-31 DATE OF PROCEDURE:  03/09/2023 DATE OF DISCHARGE: CARDIAC CATHETERIZATION History obtained from chart review.72 y.o. male with PMH of CAD s/p prior RCA, LAD and PDA stents, HTN, HLD, multiple myeloma with prior stem cell transplant who presented with chest pain and elevated troponins.   Mr. Dilmore presented with a non-STEMI with troponins in the 7000 range.  He status post LAD, RCA and distal circumflex stenting remotely.  His cath revealed a moderate distal left main with diffuse in-stent restenosis within the mid LAD stent as well as disease in the first marginal branch, second marginal branch in the AV groove circumflex.  The distal stent in the PLA branch is occluded.  The stent in the right right right coronary artery is widely patent.  His anatomy is suitable for CABG.  The guidewire and catheter were removed.  The sheath was removed and a TR band was placed on the right wrist to achieve patent hemostasis.  The patient left lab in stable condition.  He will be started back on heparin 4 hours after sheath removal without bolus.  T CTS will be consulted.  The angiograms were reviewed with Dr. Excell Seltzer as well. [name]. MD, [facility] 03/09/2023 2:46 PM    DG Chest Portable 1 View  Result Date: 03/08/2023 CLINICAL DATA:  Chest pain and shortness of breath EXAM: PORTABLE CHEST 1 VIEW COMPARISON:  11/28/2020, PET-CT from 12/12/2022 FINDINGS: Cardiac shadow is within normal limits. Lungs are well aerated bilaterally. No focal infiltrate or effusion is seen. Persistent sclerotic lesion is noted within the left scapula stable from prior PET-CT. IMPRESSION: No acute abnormality noted. Electronically Signed   By: Alcide Clever M.D.   On: 03/08/2023 19:45    Review of Systems  Constitutional:  Positive for diaphoresis. Negative for activity change and  fatigue.  HENT: Negative.    Eyes: Negative.   Respiratory:  Negative for shortness of breath.   Cardiovascular:  Positive for chest pain. Negative for leg swelling.  Gastrointestinal: Negative.   Endocrine: Negative.   Genitourinary: Negative.   Musculoskeletal: Negative.   Skin: Negative.   Allergic/Immunologic: Negative.   Neurological:  Positive for numbness. Negative for dizziness and syncope.  Hematological: Negative.   Psychiatric/Behavioral: Negative.     Blood pressure (!) 140/56, pulse 69, temperature 98.2 F (36.8 C), temperature source Oral, resp. rate 18, height 5\' 8"  (1.727 m), weight 91.8 kg, SpO2 97 %. Physical Exam Constitutional:      Appearance: Normal appearance.  HENT:     Head: Normocephalic and atraumatic.  Eyes:     Extraocular Movements: Extraocular movements intact.     Conjunctiva/sclera: Conjunctivae normal.     Pupils: Pupils are equal, round, and reactive to light.  Neck:     Vascular: No carotid bruit.  Cardiovascular:     Rate and Rhythm: Normal rate and regular rhythm.     Pulses: Normal pulses.     Heart sounds: Normal heart sounds. No murmur heard. Pulmonary:     Effort: Pulmonary effort is normal.  Breath sounds: Normal breath sounds.  Abdominal:     General: There is no distension.     Tenderness: There is no abdominal tenderness.  Musculoskeletal:        General: No swelling.  Skin:    General: Skin is warm and dry.  Neurological:     General: No focal deficit present.     Mental Status: He is alert and oriented to person, place, and time.  Psychiatric:        Mood and Affect: Mood normal.        Behavior: Behavior normal.      Procedures  LEFT HEART CATH AND CORONARY ANGIOGRAPHY   Conclusion      Ost LM to Mid LM lesion is 60% stenosed.   Mid LAD lesion is 90% stenosed.   Ost Cx lesion is 60% stenosed.   1st Mrg lesion is 90% stenosed.   Mid Cx to Dist Cx lesion is 90% stenosed.   3rd Mrg lesion is 80%  stenosed.   Previously placed Prox RCA to Mid RCA stent of unknown type is  widely patent.   The left ventricular systolic function is normal.   LV end diastolic pressure is mildly elevated.   The left ventricular ejection fraction is 50-55% by visual estimate.   Bruce Smith is a 72 y.o. male      [id number] LOCATION:  FACILITY: [facility]  PHYSICIAN: [name], M.D. Oct 24, 1951     DATE OF PROCEDURE:  03/09/2023   DATE OF DISCHARGE:        CARDIAC CATHETERIZATION        History obtained from chart review.72 y.o. male with PMH of CAD s/p prior RCA, LAD and PDA stents, HTN, HLD, multiple myeloma with prior stem cell transplant who presented with chest pain and elevated troponins.      IMPRESSION: Mr. Chorney presented with a non-STEMI with troponins in the 7000 range.  He status post LAD, RCA and distal circumflex stenting remotely.  His cath revealed a moderate distal left main with diffuse in-stent restenosis within the mid LAD stent as well as disease in the first marginal branch, second marginal branch in the AV groove circumflex.  The distal stent in the PLA branch is occluded.  The stent in the right right right coronary artery is widely patent.  His anatomy is suitable for CABG.  The guidewire and catheter were removed.  The sheath was removed and a TR band was placed on the right wrist to achieve patent hemostasis.  The patient left lab in stable condition.  He will be started back on heparin 4 hours after sheath removal without bolus.  T CTS will be consulted.  The angiograms were reviewed with Dr. Excell Seltzer as well.   [name]. MD, [facility] 03/09/2023 2:46 PM         Procedural Details  Technical Details PROCEDURE DESCRIPTION:   The patient was brought to the second floor [facility] cath lab in the postabsorptive state. He was premedicated with IV Versed and fentanyl. His right wrist was prepped and shaved in usual sterile fashion. Xylocaine 1% was used for local  anesthesia. A 6 French sheath was inserted into the right radial  artery using standard Seldinger technique. The patient received 4500 units  of heparin intravenously.  A 5 [place] TIG catheter and right Judkins catheters were used for selective coronary angiography and left ventriculography respectively.  Isovue dye is used for the entirety of the case (70 cc of contrast  total to patient).  Retrograde ordered, ventricular and pullback pressures were recorded.  Radial cocktail was administered via the SideArm sheath. Estimated blood loss <50 mL.   During this procedure medications were administered to achieve and maintain moderate conscious sedation while the patient's heart rate, blood pressure, and oxygen saturation were continuously monitored and I was present face-to-face 100% of this time.   Medications (Filter: Administrations occurring from 1353 to 1437 on 03/09/23)  important  Continuous medications are totaled by the amount administered until 03/09/23 1437.   Heparin (Porcine) in NaCl 1000-0.9 UT/500ML-% SOLN (mL)  Total volume: 1,000 mL Date/Time Rate/Dose/Volume Action   03/09/23 1401 500 mL Given   1401 500 mL Given   fentaNYL (SUBLIMAZE) injection (mcg)  Total dose: 25 mcg Date/Time Rate/Dose/Volume Action   03/09/23 1402 25 mcg Given   midazolam (VERSED) injection (mg)  Total dose: 1 mg Date/Time Rate/Dose/Volume Action   03/09/23 1402 1 mg Given   lidocaine (PF) (XYLOCAINE) 1 % injection (mL)  Total volume: 2 mL Date/Time Rate/Dose/Volume Action   03/09/23 1408 2 mL Given   Radial Cocktail/Verapamil only (mL)  Total volume: 0 mL Date/Time Rate/Dose/Volume Action   03/09/23 Canceled Entry   Radial Cocktail (Verapamil 5 mg, NTG, Lidocaine) (mL)  Total volume: 15 mL Date/Time Rate/Dose/Volume Action   03/09/23 1411 15 mL Given   heparin sodium (porcine) injection (Units)  Total dose: 4,500 Units Date/Time Rate/Dose/Volume Action   03/09/23 1412 4,500 Units Given    iohexol (OMNIPAQUE) 350 MG/ML injection (mL)  Total volume: 70 mL Date/Time Rate/Dose/Volume Action   03/09/23 1434 70 mL Given   calcium carbonate (OS-CAL - dosed in mg of elemental calcium) tablet 1,250 mg (mg)  Total dose: Cannot be calculated* Dosing weight: 91.3 *Administration dose not documented Date/Time Rate/Dose/Volume Action   03/09/23 1353 *Not included in total MAR Hold   levETIRAcetam (KEPPRA) tablet 500 mg (mg)  Total dose: Cannot be calculated* Dosing weight: 91.3 *Administration dose not documented Date/Time Rate/Dose/Volume Action   03/09/23 1353 *Not included in total MAR Hold   magnesium oxide (MAG-OX) tablet 200 mg (mg)  Total dose: Cannot be calculated* Dosing weight: 91.3 *Administration dose not documented Date/Time Rate/Dose/Volume Action   03/09/23 1353 *Not included in total MAR Hold   metoprolol succinate (TOPROL-XL) 24 hr tablet 25 mg (mg)  Total dose: Cannot be calculated* *Administration dose not documented Date/Time Rate/Dose/Volume Action   03/09/23 1353 *Not included in total MAR Hold   nitroGLYCERIN (NITROSTAT) SL tablet 0.4 mg (mg)  Total dose: Cannot be calculated* Dosing weight: 91.3 *Administration dose not documented Date/Time Rate/Dose/Volume Action   03/09/23 1353 *Not included in total MAR Hold   pantoprazole (PROTONIX) EC tablet 40 mg (mg)  Total dose: Cannot be calculated* *Administration dose not documented Date/Time Rate/Dose/Volume Action   03/09/23 1353 *Not included in total MAR Hold   pregabalin (LYRICA) capsule 200 mg (mg)  Total dose: Cannot be calculated* *Administration dose not documented Date/Time Rate/Dose/Volume Action   03/09/23 1353 *Not included in total MAR Hold   rosuvastatin (CRESTOR) tablet 40 mg (mg)  Total dose: Cannot be calculated* Dosing weight: 91.3 *Administration dose not documented Date/Time Rate/Dose/Volume Action   03/09/23 1353 *Not included in total MAR Hold   tamsulosin (FLOMAX) capsule 0.4  mg (mg)  Total dose: Cannot be calculated* *Administration dose not documented Date/Time Rate/Dose/Volume Action   03/09/23 1353 *Not included in total MAR Hold   acetaminophen (TYLENOL) tablet 650 mg (mg)  Total dose: Cannot be calculated* Dosing  weight: 91.3 *Administration dose not documented Date/Time Rate/Dose/Volume Action   03/09/23 1353 *Not included in total MAR Hold   aspirin EC tablet 81 mg (mg)  Total dose: Cannot be calculated* Dosing weight: 91.3 *Administration dose not documented Date/Time Rate/Dose/Volume Action   03/09/23 1353 *Not included in total MAR Hold   ondansetron (ZOFRAN) injection 4 mg (mg)  Total dose: Cannot be calculated* Dosing weight: 91.3 *Administration dose not documented Date/Time Rate/Dose/Volume Action   03/09/23 1353 *Not included in total MAR Hold    Sedation Time  Sedation Time Physician-1: 20 minutes 59 seconds Contrast     Administrations occurring from 1353 to 1437 on 03/09/23:  Medication Name Total Dose  iohexol (OMNIPAQUE) 350 MG/ML injection 70 mL   Radiation/Fluoro  Fluoro time: 4.4 (min) DAP: 52520 (mGycm2) Cumulative Air Kerma: 647 (mGy) Complications  Complications documented before study signed (03/09/2023  2:49 PM)   No complications were associated with this study.  Documented by Bethanie Dicker D - 03/09/2023  2:34 PM     Coronary Findings  Diagnostic Dominance: Co-dominant Left Main  Ost LM to Mid LM lesion is 60% stenosed.    Left Anterior Descending  Mid LAD lesion is 90% stenosed. Vessel is the culprit lesion. The lesion was previously treated .    Left Circumflex  Ost Cx lesion is 60% stenosed.  Mid Cx to Dist Cx lesion is 90% stenosed.    First Obtuse Marginal Branch  1st Mrg lesion is 90% stenosed.    Third Obtuse Marginal Branch  3rd Mrg lesion is 80% stenosed.    Right Coronary Artery  Previously placed Prox RCA to Mid RCA stent of unknown type is widely patent.    Intervention   No  interventions have been documented.   Wall Motion     The following segments are normal: mid anterior, mid inferior, basilar anterior, basilar inferior, apical anterior and apical inferior.           Left Heart  Left Ventricle The left ventricular size is normal. The left ventricular systolic function is normal. LV end diastolic pressure is mildly elevated. The left ventricular ejection fraction is 50-55% by visual estimate. No regional wall motion abnormalities.   Coronary Diagrams  Diagnostic Dominance: Co-dominant  Intervention   Implants   No implant documentation for this case.   Syngo Images   Show images for CARDIAC CATHETERIZATION Images on Long Term Storage   Show images for Thoams, Kerin to Procedure Log  Procedure Log    Hemo Data  Flowsheet Row Most Recent Value  AO Systolic Pressure 109 mmHg  AO Diastolic Pressure 54 mmHg  AO Mean 76 mmHg  LV Systolic Pressure 130 mmHg  LV Diastolic Pressure 1 mmHg  LV EDP 15 mmHg  AOp Systolic Pressure 129 mmHg  AOp Diastolic Pressure 56 mmHg  AOp Mean Pressure 85 mmHg  LVp Systolic Pressure 130 mmHg  LVp Diastolic Pressure 8 mmHg  LVp EDP Pressure 17 mmHg    ECHOCARDIOGRAM REPORT       Patient Name:   Bruce Smith Date of Exam: 03/09/2023  Medical Rec #:  [id number]    Height:       68.0 in  Accession #:    [id number]   Weight:       199.7 lb  Date of Birth:  January 09, 1951     BSA:          2.043 m  Patient Age:    72 years  BP:           142/61 mmHg  Patient Gender: M            HR:           57 bpm.  Exam Location:  Inpatient   Procedure: 2D Echo, Cardiac Doppler, Color Doppler and Intracardiac             Opacification Agent   Indications:    122-I22.9 Subsequent ST elevation (STEM) and non-ST  elevation                 (NSTEMI) myocardial infarction    History:        Patient has prior history of Echocardiogram examinations,  most                 recent 10/30/2020. Acute MI and CAD; Risk  Factors:Former  Smoker                 and Dyslipidemia. Metastatic disease.    Sonographer:    Sheralyn Boatman RDCS  Referring Phys: Maurine Minister NARCISSE JR   IMPRESSIONS     1. Left ventricular ejection fraction, by estimation, is 55 to 60%. The  left ventricle has normal function. The left ventricle demonstrates  regional wall motion abnormalities (see scoring diagram/findings for  description). There is mild left ventricular   hypertrophy. Left ventricular diastolic parameters were grossly normal.   2. Right ventricular systolic function is normal. The right ventricular  size is normal. There is normal pulmonary artery systolic pressure. The  estimated right ventricular systolic pressure is 20.1 mmHg.   3. The mitral valve is grossly normal. Trivial mitral valve  regurgitation. No evidence of mitral stenosis.   4. The aortic valve is tricuspid. There is mild calcification of the  aortic valve. Aortic valve regurgitation is not visualized. No aortic  stenosis is present.   5. The inferior vena cava is normal in size with greater than 50%  respiratory variability, suggesting right atrial pressure of 3 mmHg.   FINDINGS   Left Ventricle: Left ventricular ejection fraction, by estimation, is 55  to 60%. The left ventricle has normal function. The left ventricle  demonstrates regional wall motion abnormalities. Definity contrast agent  was given IV to delineate the left  ventricular endocardial borders. The left ventricular internal cavity size  was normal in size. There is mild left ventricular hypertrophy. Left  ventricular diastolic parameters were normal.     LV Wall Scoring:  The apical lateral segment, apical septal segment, and apical anterior  segment are hypokinetic.   Right Ventricle: The right ventricular size is normal. No increase in  right ventricular wall thickness. Right ventricular systolic function is  normal. There is normal pulmonary artery systolic pressure. The  tricuspid  regurgitant velocity is 2.07 m/s, and   with an assumed right atrial pressure of 3 mmHg, the estimated right  ventricular systolic pressure is 20.1 mmHg.   Left Atrium: Left atrial size was normal in size.   Right Atrium: Right atrial size was normal in size.   Pericardium: Trivial pericardial effusion is present.   Mitral Valve: The mitral valve is grossly normal. Trivial mitral valve  regurgitation. No evidence of mitral valve stenosis.   Tricuspid Valve: The tricuspid valve is normal in structure. Tricuspid  valve regurgitation is mild . No evidence of tricuspid stenosis.   Aortic Valve: The aortic valve is tricuspid. There is mild calcification  of the aortic valve. Aortic valve  regurgitation is not visualized. No  aortic stenosis is present.   Pulmonic Valve: The pulmonic valve was normal in structure. Pulmonic valve  regurgitation is trivial. No evidence of pulmonic stenosis.   Aorta: The aortic root is normal in size and structure.   Venous: The inferior vena cava is normal in size with greater than 50%  respiratory variability, suggesting right atrial pressure of 3 mmHg.   IAS/Shunts: No atrial level shunt detected by color flow Doppler.     LEFT VENTRICLE  PLAX 2D  LVIDd:         5.05 cm      Diastology  LVIDs:         3.00 cm      LV e' medial:    6.85 cm/s  LV PW:         0.95 cm      LV E/e' medial:  11.8  LV IVS:        1.10 cm      LV e' lateral:   8.75 cm/s  LVOT diam:     2.30 cm      LV E/e' lateral: 9.2  LV SV:         99  LV SV Index:   48  LVOT Area:     4.15 cm    LV Volumes (MOD)  LV vol d, MOD A2C: 150.0 ml  LV vol d, MOD A4C: 151.0 ml  LV vol s, MOD A2C: 70.5 ml  LV vol s, MOD A4C: 57.8 ml  LV SV MOD A2C:     79.5 ml  LV SV MOD A4C:     151.0 ml  LV SV MOD BP:      89.5 ml   RIGHT VENTRICLE             IVC  RV S prime:     11.30 cm/s  IVC diam: 2.10 cm  TAPSE (M-mode): 2.0 cm   LEFT ATRIUM             Index        RIGHT  ATRIUM          Index  LA diam:        2.80 cm 1.37 cm/m   RA Area:     8.65 cm  LA Vol (A2C):   30.0 ml 14.69 ml/m  RA Volume:   14.20 ml 6.95 ml/m  LA Vol (A4C):   24.6 ml 12.04 ml/m  LA Biplane Vol: 29.8 ml 14.59 ml/m   AORTIC VALVE             PULMONIC VALVE  LVOT Vmax:   103.00 cm/s PR End Diast Vel: 1.47 msec  LVOT Vmean:  63.400 cm/s  LVOT VTI:    0.238 m    AORTA  Ao Root diam: 3.50 cm  Ao Asc diam:  3.20 cm   MITRAL VALVE               TRICUSPID VALVE  MV Area (PHT): 2.45 cm    TR Peak grad:   17.1 mmHg  MV Decel Time: 310 msec    TR Vmax:        207.00 cm/s  MV E velocity: 80.70 cm/s  MV A velocity: 73.40 cm/s  SHUNTS  MV E/A ratio:  1.10        Systemic VTI:  0.24 m  Systemic Diam: 2.30 cm   Weston Brass MD  Electronically signed by Weston Brass MD  Signature Date/Time: 03/09/2023/2:52:27 PM        Final      Assessment/Plan:  This 72 year old gentleman has severe three-vessel coronary disease presenting with non-ST segment elevation MI.  He has high-grade stenoses in the LAD and left circumflex territories with prior stenting and his coronary disease is best treated with bypass surgery.  He is still in reasonable condition to undergo that surgery successfully. I discussed the operative procedure with the patient and family including alternatives, benefits and risks; including but not limited to bleeding, blood transfusion, infection, stroke, myocardial infarction, graft failure, heart block requiring a permanent pacemaker, organ dysfunction, and death.  [name] understands and agrees to proceed.  We will schedule surgery for Monday am.  Alleen Borne 03/10/2023, 2:28 PM

## 2023-03-10 NOTE — Progress Notes (Signed)
ANTICOAGULATION CONSULT NOTE   Pharmacy Consult for heparin Indication: chest pain/ACS  No Known Allergies  Patient Measurements: Height: 5\' 8"  (172.7 cm) Weight: 91.8 kg (202 lb 6.1 oz) IBW/kg (Calculated) : 68.4 Heparin Dosing Weight: 87.2 kg  Vital Signs: Temp: 98.7 F (37.1 C) (05/10 0432) Temp Source: Oral (05/10 0432) BP: 125/52 (05/10 0432) Pulse Rate: 68 (05/10 0432)  Labs: Recent Labs    03/08/23 1932 03/08/23 2320 03/09/23 0304 03/09/23 0715 03/10/23 0102  HGB 13.1  --  12.5*  --  12.6*  HCT 39.0  --  37.6*  --  37.5*  PLT 146*  --  133*  --  136*  LABPROT  --   --  13.3  --   --   INR  --   --  1.0  --   --   HEPARINUNFRC  --   --   --  0.54  --   CREATININE 1.11  --  1.20  --  1.08  TROPONINIHS 1,279* 7,465*  --   --   --      Estimated Creatinine Clearance: 69 mL/min (by C-G formula based on SCr of 1.08 mg/dL).   Medical History: Past Medical History:  Diagnosis Date   Atherosclerosis    Coronary artery disease    Goals of care, counseling/discussion 06/29/2020   History of radiation therapy    Left Humerus, T-spine, Pelvis- 09/19/22-10/04/22- Dr. Antony Blackbird   History of radiation therapy    Right Femur- 01/15/23-01/16/23- Dr. 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 Previous MI s/p 3 stents. No anticoag PTA. Troponin 1279 and CBC stable.   S/p cath yesterday with diffuse CAD, pharmacy asked to resume IV heparin.  Awaiting TCTS consult.  Goal of Therapy:  Heparin level 0.3-0.7 units/ml Monitor platelets by anticoagulation protocol: Yes   Plan:  Resume IV heparin at 1300 units/hr.   Check heparin level 8 hrs after gtt started. Daily heparin level and CBC. F/u TCTS plans.  Reece Leader, Colon Flattery, BCCP Clinical Pharmacist  03/10/2023 8:23 AM   [facility] pharmacy phone numbers are listed on [email or web address]

## 2023-03-10 NOTE — Progress Notes (Signed)
Pre-CABG Vascular US ordered this morning. Pt went down for Vascular US this afternoon.  Around 16:30, Family members stated that patient didn't take Vascular US downstairs. Family said that [place] department was trying to do another Echo the patient already did. They were asking when the patient could get the Vascular US done. I called [facility] [place] department, Cardiology and Cardio-thoracic PA to get information about getting Vascular US. Vas [place] department was closed before 17:00. Cardio-thoracic PA said they will try to get Vas US done tomorrow. Notified patient and family member about possible Vascular US tomorrow. Will let charge and night shift nurse know about this incident.

## 2023-03-11 ENCOUNTER — Inpatient Hospital Stay (HOSPITAL_COMMUNITY): Payer: Medicare Other

## 2023-03-11 DIAGNOSIS — Z01810 Encounter for preprocedural cardiovascular examination: Secondary | ICD-10-CM | POA: Diagnosis not present

## 2023-03-11 DIAGNOSIS — I214 Non-ST elevation (NSTEMI) myocardial infarction: Secondary | ICD-10-CM | POA: Diagnosis not present

## 2023-03-11 LAB — CBC
HCT: 36.7 % — ABNORMAL LOW (ref 39.0–52.0)
Hemoglobin: 11.9 g/dL — ABNORMAL LOW (ref 13.0–17.0)
MCH: 31.8 pg (ref 26.0–34.0)
MCHC: 32.4 g/dL (ref 30.0–36.0)
MCV: 98.1 fL (ref 80.0–100.0)
Platelets: 122 10*3/uL — ABNORMAL LOW (ref 150–400)
RBC: 3.74 MIL/uL — ABNORMAL LOW (ref 4.22–5.81)
RDW: 13.6 % (ref 11.5–15.5)
WBC: 5.5 10*3/uL (ref 4.0–10.5)
nRBC: 0 % (ref 0.0–0.2)

## 2023-03-11 LAB — HEPARIN LEVEL (UNFRACTIONATED): Heparin Unfractionated: 0.41 IU/mL (ref 0.30–0.70)

## 2023-03-11 LAB — SURGICAL PCR SCREEN
MRSA, PCR: NEGATIVE
Staphylococcus aureus: NEGATIVE

## 2023-03-11 LAB — LIPOPROTEIN A (LPA): Lipoprotein (a): 16.8 nmol/L (ref <75.0)

## 2023-03-11 MED ORDER — NITROGLYCERIN IN D5W 200-5 MCG/ML-% IV SOLN
2.0000 ug/min | INTRAVENOUS | Status: DC
  Administered 2023-03-11: 5 ug/min via INTRAVENOUS
  Filled 2023-03-11: qty 250

## 2023-03-11 MED ORDER — CEFAZOLIN SODIUM-DEXTROSE 2-4 GM/100ML-% IV SOLN
2.0000 g | INTRAVENOUS | Status: AC
  Administered 2023-03-13: 2 g via INTRAVENOUS

## 2023-03-11 NOTE — Progress Notes (Signed)
ANTICOAGULATION CONSULT NOTE   Pharmacy Consult for heparin Indication: chest pain/ACS  No Known Allergies  Patient Measurements: Height: 5\' 8"  (172.7 cm) Weight: 91.8 kg (202 lb 6.1 oz) IBW/kg (Calculated) : 68.4 Heparin Dosing Weight: 87.2 kg  Vital Signs: Temp: 97.5 F (36.4 C) (05/11 0516) Temp Source: Oral (05/11 0516) BP: 112/53 (05/11 0516) Pulse Rate: 60 (05/11 0516)  Labs: Recent Labs    03/08/23 1932 03/08/23 2320 03/09/23 0304 03/09/23 0715 03/10/23 0102 03/10/23 1831 03/11/23 0041  HGB 13.1  --  12.5*  --  12.6*  --  11.9*  HCT 39.0  --  37.6*  --  37.5*  --  36.7*  PLT 146*  --  133*  --  136*  --  122*  LABPROT  --   --  13.3  --   --   --   --   INR  --   --  1.0  --   --   --   --   HEPARINUNFRC  --   --   --  0.54  --  0.35 0.41  CREATININE 1.11  --  1.20  --  1.08  --   --   TROPONINIHS 1,279* 1,610*  --   --   --   --   --      Estimated Creatinine Clearance: 69 mL/min (by C-G formula based on SCr of 1.08 mg/dL).   Medical History: Past Medical History:  Diagnosis Date   Atherosclerosis    Coronary artery disease    Goals of care, counseling/discussion 06/29/2020   History of radiation therapy    Left Humerus, T-spine, Pelvis- 09/19/22-10/04/22- Dr. Antony Blackbird   History of radiation therapy    Right Femur- 01/15/23-01/16/23- Dr. 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   Multiple myeloma (HCC) 07/21/2020   Myocardial infarction (HCC)     Medications:   Scheduled:   acetaminophen  1,000 mg Oral TID AC & HS   aspirin  81 mg Oral Daily   calcium carbonate  1 tablet Oral BID WC   [START ON 03/13/2023] epinephrine  0-10 mcg/min Intravenous To OR   [START ON 03/13/2023] heparin sodium (porcine) 2,500 Units, papaverine 30 mg in electrolyte-A (PLASMALYTE-A PH 7.4) 500 mL irrigation   Irrigation To OR   [START ON 03/13/2023]  insulin   Intravenous To OR   isosorbide mononitrate  30 mg Oral Daily   levETIRAcetam  500 mg Oral BID   magnesium oxide  200 mg Oral Daily   [START ON 03/13/2023] magnesium sulfate  40 mEq Other To OR   metoprolol succinate  25 mg Oral Daily   pantoprazole  40 mg Oral BID AC   [START ON 03/13/2023] phenylephrine  30-200 mcg/min Intravenous To OR   [START ON 03/13/2023] potassium chloride  80 mEq Other To OR   pregabalin  200 mg Oral BID   rosuvastatin  40 mg Oral Daily   saccharomyces boulardii  250 mg Oral Daily   sodium chloride flush  3 mL Intravenous Q12H   tamsulosin  0.4 mg Oral Daily   traMADol  50 mg Oral TID AC & HS   [START ON 03/13/2023] tranexamic acid  15 mg/kg Intravenous To OR   [START ON 03/13/2023] tranexamic acid  2 mg/kg Intracatheter To OR   Infusions:   sodium chloride     [START ON  03/13/2023]  ceFAZolin (ANCEF) IV     [START ON 03/13/2023]  ceFAZolin (ANCEF) IV     [START ON 03/13/2023] dexmedetomidine     [START ON 03/13/2023] heparin 30,000 units/NS 1000 mL solution for CELLSAVER     heparin 1,300 Units/hr (03/11/23 0725)   [START ON 03/13/2023] milrinone     nitroGLYCERIN Stopped (03/09/23 0012)   [START ON 03/13/2023] nitroGLYCERIN     [START ON 03/13/2023] norepinephrine     [START ON 03/13/2023] tranexamic acid (CYKLOKAPRON) 2,500 mg in sodium chloride 0.9 % 250 mL (10 mg/mL) infusion     [START ON 03/13/2023] vancomycin      Assessment: Bruce Smith presenting with chest pain. Previous MI s/p 3 stents. No anticoag PTA. S/p cath yesterday with diffuse CAD, pharmacy asked to resume IV heparin. Awaiting CVTS consult.  Daily heparin level therapeutic at 0.41. Hgb 11.9, PLT 122 stable. No bleed issues or issues with heparin infusion noted.   Goal of Therapy:  Heparin level 0.3-0.7 units/ml Monitor platelets by anticoagulation protocol: Yes   Plan:  Continue IV heparin at 1300 units/hr.   Daily heparin level and CBC  Monitor daily CBC, s/sx bleeding F/u CVTS  consult  Larena Sox, PharmD PGY1 Pharmacy Resident   03/11/2023  7:49 AM

## 2023-03-11 NOTE — Progress Notes (Signed)
Rounding Note    Patient Name: Bruce Smith Date of Encounter: 03/11/2023  [facility] Cardiologist: Lance Muss, MD   Subjective   No chest pain, no SOB  Having some intermittent left arm pain but not the chest, jaw, and arm pain present on admission On ultram   Inpatient Medications    Scheduled Meds:  acetaminophen  1,000 mg Oral TID AC & HS   aspirin  81 mg Oral Daily   calcium carbonate  1 tablet Oral BID WC   [START ON 03/13/2023] epinephrine  0-10 mcg/min Intravenous To OR   [START ON 03/13/2023] heparin sodium (porcine) 2,500 Units, papaverine 30 mg in electrolyte-A (PLASMALYTE-A PH 7.4) 500 mL irrigation   Irrigation To OR   [START ON 03/13/2023] insulin   Intravenous To OR   isosorbide mononitrate  30 mg Oral Daily   levETIRAcetam  500 mg Oral BID   magnesium oxide  200 mg Oral Daily   [START ON 03/13/2023] magnesium sulfate  40 mEq Other To OR   metoprolol succinate  25 mg Oral Daily   pantoprazole  40 mg Oral BID AC   [START ON 03/13/2023] phenylephrine  30-200 mcg/min Intravenous To OR   [START ON 03/13/2023] potassium chloride  80 mEq Other To OR   pregabalin  200 mg Oral BID   rosuvastatin  40 mg Oral Daily   saccharomyces boulardii  250 mg Oral Daily   sodium chloride flush  3 mL Intravenous Q12H   tamsulosin  0.4 mg Oral Daily   traMADol  50 mg Oral TID AC & HS   [START ON 03/13/2023] tranexamic acid  15 mg/kg Intravenous To OR   [START ON 03/13/2023] tranexamic acid  2 mg/kg Intracatheter To OR   Continuous Infusions:  sodium chloride     [START ON 03/13/2023]  ceFAZolin (ANCEF) IV     [START ON 03/13/2023]  ceFAZolin (ANCEF) IV     [START ON 03/13/2023] dexmedetomidine     [START ON 03/13/2023] heparin 30,000 units/NS 1000 mL solution for CELLSAVER     heparin 1,300 Units/hr (03/11/23 0725)   [START ON 03/13/2023] milrinone     nitroGLYCERIN Stopped (03/09/23 0012)   [START ON 03/13/2023] nitroGLYCERIN     [START ON 03/13/2023] norepinephrine      [START ON 03/13/2023] tranexamic acid (CYKLOKAPRON) 2,500 mg in sodium chloride 0.9 % 250 mL (10 mg/mL) infusion     [START ON 03/13/2023] vancomycin     PRN Meds: sodium chloride, morphine injection, nitroGLYCERIN, ondansetron (ZOFRAN) IV, polyethylene glycol powder, sodium chloride flush   Vital Signs    Vitals:   03/11/23 0454 03/11/23 0516 03/11/23 0836 03/11/23 0843  BP:  (!) 112/53 129/64   Pulse:  60 (!) 56   Resp:  19 15 13   Temp:  (!) 97.5 F (36.4 C) (!) 97.5 F (36.4 C)   TempSrc:  Oral Oral   SpO2:  94% 96%   Weight: 91.8 kg     Height:        Intake/Output Summary (Last 24 hours) at 03/11/2023 1111 Last data filed at 03/10/2023 2244 Gross per 24 hour  Intake 483 ml  Output 1 ml  Net 482 ml      03/11/2023    4:54 AM 03/10/2023    4:32 AM 03/09/2023    3:00 AM  Last 3 Weights  Weight (lbs) 202 lb 6.1 oz 202 lb 6.1 oz 199 lb 11.8 oz  Weight (kg) 91.8 kg 91.8 kg 90.6  kg      Telemetry    SR - Personally Reviewed  ECG    No new - Personally Reviewed  Physical Exam   GEN: No acute distress. Bruising over arms chronic  Neck: No JVD Cardiac: RRR, no murmurs, rubs, or gallops. Rt wrist cath site without hematoma  Respiratory: Clear to auscultation bilaterally. GI: Soft, nontender, non-distended  MS: No edema; No deformity. Neuro:  Nonfocal  Psych: Normal affect   Labs    High Sensitivity Troponin:   Recent Labs  Lab 03/08/23 1932 03/08/23 2320  TROPONINIHS 1,279* 7,465*     Chemistry Recent Labs  Lab 03/08/23 1932 03/09/23 0304 03/10/23 0102  NA 138 140 141  K 4.6 3.8 3.4*  CL 106 107 111  CO2 20* 25 21*  GLUCOSE 120* 105* 123*  BUN 20 21 15   CREATININE 1.11 1.20 1.08  CALCIUM 9.3 9.2 8.5*  GFRNONAA >60 >60 >60  ANIONGAP 12 8 9     Lipids  Recent Labs  Lab 03/09/23 0304  CHOL 173  TRIG 168*  HDL 41  LDLCALC 98  CHOLHDL 4.2    Hematology Recent Labs  Lab 03/09/23 0304 03/10/23 0102 03/11/23 0041  WBC 4.2 5.0 5.5  RBC  3.93* 3.93* 3.74*  HGB 12.5* 12.6* 11.9*  HCT 37.6* 37.5* 36.7*  MCV 95.7 95.4 98.1  MCH 31.8 32.1 31.8  MCHC 33.2 33.6 32.4  RDW 13.4 13.5 13.6  PLT 133* 136* 122*   Thyroid  Recent Labs  Lab 03/09/23 0304  TSH 2.847  FREET4 0.74    BNP Recent Labs  Lab 03/09/23 0304  BNP 123.9*    DDimer No results for input(s): "DDIMER" in the last 168 hours.   Radiology    CARDIAC CATHETERIZATION  Result Date: 03/09/2023 Images from the original result were not included.   Ost LM to Mid LM lesion is 60% stenosed.   Mid LAD lesion is 90% stenosed.   Ost Cx lesion is 60% stenosed.   1st Mrg lesion is 90% stenosed.   Mid Cx to Dist Cx lesion is 90% stenosed.   3rd Mrg lesion is 80% stenosed.   Previously placed Prox RCA to Mid RCA stent of unknown type is  widely patent.   The left ventricular systolic function is normal.   LV end diastolic pressure is mildly elevated.   The left ventricular ejection fraction is 50-55% by visual estimate. Bruce Smith is a 72 y.o. male  [id number] LOCATION:  FACILITY: [facility] PHYSICIAN: [name], M.D. 1951-10-10 DATE OF PROCEDURE:  03/09/2023 DATE OF DISCHARGE: CARDIAC CATHETERIZATION History obtained from chart review.72 y.o. male with PMH of CAD s/p prior RCA, LAD and PDA stents, HTN, HLD, multiple myeloma with prior stem cell transplant who presented with chest pain and elevated troponins.   Bruce Smith presented with a non-STEMI with troponins in the 7000 range.  He status post LAD, RCA and distal circumflex stenting remotely.  His cath revealed a moderate distal left main with diffuse in-stent restenosis within the mid LAD stent as well as disease in the first marginal branch, second marginal branch in the AV groove circumflex.  The distal stent in the PLA branch is occluded.  The stent in the right right right coronary artery is widely patent.  His anatomy is suitable for CABG.  The guidewire and catheter were removed.  The sheath was removed and a TR band was placed  on the right wrist to achieve patent hemostasis.  The patient left lab  in stable condition.  He will be started back on heparin 4 hours after sheath removal without bolus.  T CTS will be consulted.  The angiograms were reviewed with Dr. Excell Seltzer as well. [name]. MD, [facility] 03/09/2023 2:46 PM     Cardiac Studies   Cardiac Cath 03/09/23    Ost LM to Mid LM lesion is 60% stenosed.   Mid LAD lesion is 90% stenosed.   Ost Cx lesion is 60% stenosed.   1st Mrg lesion is 90% stenosed.   Mid Cx to Dist Cx lesion is 90% stenosed.   3rd Mrg lesion is 80% stenosed.   Previously placed Prox RCA to Mid RCA stent of unknown type is  widely patent.   The left ventricular systolic function is normal.   LV end diastolic pressure is mildly elevated.   The left ventricular ejection fraction is 50-55% by visual estimate.     IMPRESSION: Mr. Haser presented with a non-STEMI with troponins in the 7000 range.  He status post LAD, RCA and distal circumflex stenting remotely.  His cath revealed a moderate distal left main with diffuse in-stent restenosis within the mid LAD stent as well as disease in the first marginal branch, second marginal branch in the AV groove circumflex.  The distal stent in the PLA branch is occluded.  The stent in the right right right coronary artery is widely patent.  His anatomy is suitable for CABG.  The guidewire and catheter were removed.  The sheath was removed and a TR band was placed on the right wrist to achieve patent hemostasis.  The patient left lab in stable condition.  He will be started back on heparin 4 hours after sheath removal without bolus.  T CTS will be consulted.  The angiograms were reviewed with Dr. Excell Seltzer as well.   Diagnostic Dominance: Co-dominant  IMPRESSIONS     1. Left ventricular ejection fraction, by estimation, is 55 to 60%. The  left ventricle has normal function. The left ventricle demonstrates  regional wall motion abnormalities (see scoring  diagram/findings for  description). There is mild left ventricular   hypertrophy. Left ventricular diastolic parameters were grossly normal.   2. Right ventricular systolic function is normal. The right ventricular  size is normal. There is normal pulmonary artery systolic pressure. The  estimated right ventricular systolic pressure is 20.1 mmHg.   3. The mitral valve is grossly normal. Trivial mitral valve  regurgitation. No evidence of mitral stenosis.   4. The aortic valve is tricuspid. There is mild calcification of the  aortic valve. Aortic valve regurgitation is not visualized. No aortic  stenosis is present.   5. The inferior vena cava is normal in size with greater than 50%  respiratory variability, suggesting right atrial pressure of 3 mmHg.   FINDINGS   Left Ventricle: Left ventricular ejection fraction, by estimation, is 55  to 60%. The left ventricle has normal function. The left ventricle  demonstrates regional wall motion abnormalities. Definity contrast agent  was given IV to delineate the left  ventricular endocardial borders. The left ventricular internal cavity size  was normal in size. There is mild left ventricular hypertrophy. Left  ventricular diastolic parameters were normal.     LV Wall Scoring:  The apical lateral segment, apical septal segment, and apical anterior  segment are hypokinetic.   Right Ventricle: The right ventricular size is normal. No increase in  right ventricular wall thickness. Right ventricular systolic function is  normal. There is normal  pulmonary artery systolic pressure. The tricuspid  regurgitant velocity is 2.07 m/s, and   with an assumed right atrial pressure of 3 mmHg, the estimated right  ventricular systolic pressure is 20.1 mmHg.   Left Atrium: Left atrial size was normal in size.   Right Atrium: Right atrial size was normal in size.   Pericardium: Trivial pericardial effusion is present.   Mitral Valve: The mitral valve  is grossly normal. Trivial mitral valve  regurgitation. No evidence of mitral valve stenosis.   Tricuspid Valve: The tricuspid valve is normal in structure. Tricuspid  valve regurgitation is mild . No evidence of tricuspid stenosis.   Aortic Valve: The aortic valve is tricuspid. There is mild calcification  of the aortic valve. Aortic valve regurgitation is not visualized. No  aortic stenosis is present.   Pulmonic Valve: The pulmonic valve was normal in structure. Pulmonic valve  regurgitation is trivial. No evidence of pulmonic stenosis.   Aorta: The aortic root is normal in size and structure.   Venous: The inferior vena cava is normal in size with greater than 50%  respiratory variability, suggesting right atrial pressure of 3 mmHg.   IAS/Shunts: No atrial level shunt detected by color flow Doppler.       Patient Profile     73 y.o. male with PMH of CAD s/p prior RCA, LAD and PDA stents, HTN, HLD, multiple myeloma with prior stem cell transplant who presented with chest pain and elevated troponins.   Assessment & Plan    NSTEMI/CAD with prior stents to RCA. LAD and PDA --cardiac cath with 60% LM, 90% LAD, LCX 90%  RCA stent previously placed is patent.  EF 50-55% --TCTS consult - surgery scheduled for Monday --IV heparin resumed.  --IV NTG weaning  --Echo with EF 55-60%   Diarrhea --resolved. Enteric precautions lifted --ultram causes constipation so on   HTN  --toprol 25 daily  HLD --LDL 98 HDL 41  --crestor 40 daily   Multiple myeloma --prior stem cell transplant  --initially successful but now with recurrence of myeloma at 2 sites in pelvis.  They have been irradiated and taken care of .  --chronic Lt shoulder pain on tylenol and ultram - have reordered   Chronic pain Lt shoulder pain --on tylenol and ultram re-odered AC and HS    For questions or updates, please contact [facility] Please consult [email or web address] for contact info under         Signed, Maurice Small, MD  03/11/2023, 11:11 AM

## 2023-03-11 NOTE — Progress Notes (Signed)
Patient ID: Bruce Smith, male   DOB: 12/23/1950, 72 y.o.   MRN: [id number] TCTS  Stopped by to see pt and he says he has been having some left arm and jaw pain today. Had received 3 SL NTG. Still has some jaw pain but improving, 3/10 currently. He has been on IV heparin. Was up walking around today and visiting with family with no problems. ECG today unchanged but done when he was not having any pain. Rhythm and VS stable. Would start him on NTG drip if pain does not resolve.

## 2023-03-11 NOTE — Progress Notes (Signed)
Contact made to Judyann Munson, infection prevention MD in regard to enteric precautions for potential Cdif. Informed Dr Drue Second patient states he has not had diarrhea since Wednesday night (5/8). A stool sample was never collected and we are unable to collect one at this time d/t patient now having formed stool, which would not be accepted by lab. Dr Drue Second states enteric precautions can be lifted at this time.

## 2023-03-11 NOTE — Progress Notes (Signed)
Received patient in chair. Patient and sister are agreeable to [facility] pre-op education. Provided the [facility] surgery booklet to patient. Reviewed and stressed the move-in-tube concept. Reviewed bed-mobility and had patient practice getting out of bed. Pt voices that his wife will be with him after discharge. Provided patient with I/S and had patient practice, pt pulled 2500 mL. I cued th Pre-op [facility] video for patient and sister to watch and left instructions so patient can watch with his spouse later. All questions answered.   Lorin Picket MS, ACSM-CEP, CCRP  9:00-9:40

## 2023-03-11 NOTE — Progress Notes (Signed)
Pre-CABG testing has been completed. Preliminary results can be found in CV Proc through chart review.   03/11/23 11:57 AM Olen Cordial RVT

## 2023-03-12 ENCOUNTER — Encounter (HOSPITAL_COMMUNITY): Payer: Self-pay | Admitting: Internal Medicine

## 2023-03-12 DIAGNOSIS — I2510 Atherosclerotic heart disease of native coronary artery without angina pectoris: Secondary | ICD-10-CM | POA: Diagnosis not present

## 2023-03-12 DIAGNOSIS — I214 Non-ST elevation (NSTEMI) myocardial infarction: Secondary | ICD-10-CM | POA: Diagnosis not present

## 2023-03-12 LAB — HEPARIN LEVEL (UNFRACTIONATED): Heparin Unfractionated: 0.31 IU/mL (ref 0.30–0.70)

## 2023-03-12 LAB — URINALYSIS, ROUTINE W REFLEX MICROSCOPIC
Bilirubin Urine: NEGATIVE
Glucose, UA: NEGATIVE mg/dL
Hgb urine dipstick: NEGATIVE
Ketones, ur: NEGATIVE mg/dL
Leukocytes,Ua: NEGATIVE
Nitrite: NEGATIVE
Protein, ur: NEGATIVE mg/dL
Specific Gravity, Urine: 1.008 (ref 1.005–1.030)
pH: 5 (ref 5.0–8.0)

## 2023-03-12 LAB — CBC
HCT: 35.9 % — ABNORMAL LOW (ref 39.0–52.0)
Hemoglobin: 11.9 g/dL — ABNORMAL LOW (ref 13.0–17.0)
MCH: 31.4 pg (ref 26.0–34.0)
MCHC: 33.1 g/dL (ref 30.0–36.0)
MCV: 94.7 fL (ref 80.0–100.0)
Platelets: 121 10*3/uL — ABNORMAL LOW (ref 150–400)
RBC: 3.79 MIL/uL — ABNORMAL LOW (ref 4.22–5.81)
RDW: 13.3 % (ref 11.5–15.5)
WBC: 7.7 10*3/uL (ref 4.0–10.5)
nRBC: 0 % (ref 0.0–0.2)

## 2023-03-12 LAB — TYPE AND SCREEN: Donor AG Type: NEGATIVE

## 2023-03-12 LAB — BPAM RBC
Blood Product Expiration Date: 202406122359
Blood Product Expiration Date: 202406132359
Unit Type and Rh: 5100
Unit Type and Rh: 5100

## 2023-03-12 LAB — VAS US DOPPLER PRE CABG
Left ABI: 1.17
Right ABI: 1.27

## 2023-03-12 LAB — SARS CORONAVIRUS 2 (TAT 6-24 HRS): SARS Coronavirus 2: NEGATIVE

## 2023-03-12 LAB — PREPARE RBC (CROSSMATCH)

## 2023-03-12 MED ORDER — BISACODYL 5 MG PO TBEC
5.0000 mg | DELAYED_RELEASE_TABLET | Freq: Once | ORAL | Status: AC
  Administered 2023-03-12: 5 mg via ORAL
  Filled 2023-03-12: qty 1

## 2023-03-12 MED ORDER — DIAZEPAM 5 MG PO TABS
5.0000 mg | ORAL_TABLET | Freq: Once | ORAL | Status: AC
  Administered 2023-03-13: 5 mg via ORAL
  Filled 2023-03-12: qty 1

## 2023-03-12 MED ORDER — METOPROLOL TARTRATE 12.5 MG HALF TABLET: 12.5000 mg | ORAL_TABLET | Freq: Once | ORAL | Status: DC

## 2023-03-12 MED ORDER — TEMAZEPAM 7.5 MG PO CAPS
15.0000 mg | ORAL_CAPSULE | Freq: Once | ORAL | Status: AC | PRN
  Administered 2023-03-12: 15 mg via ORAL
  Filled 2023-03-12: qty 2

## 2023-03-12 MED ORDER — CHLORHEXIDINE GLUCONATE CLOTH 2 % EX PADS
6.0000 | MEDICATED_PAD | Freq: Once | CUTANEOUS | Status: AC
  Administered 2023-03-12: 6 via TOPICAL

## 2023-03-12 MED ORDER — CHLORHEXIDINE GLUCONATE 0.12 % MT SOLN
15.0000 mL | Freq: Once | OROMUCOSAL | Status: AC
  Administered 2023-03-13: 15 mL via OROMUCOSAL
  Filled 2023-03-12: qty 15

## 2023-03-12 MED ORDER — CHLORHEXIDINE GLUCONATE CLOTH 2 % EX PADS
6.0000 | MEDICATED_PAD | Freq: Once | CUTANEOUS | Status: AC
  Administered 2023-03-13: 6 via TOPICAL

## 2023-03-12 NOTE — H&P (View-Only) (Signed)
3 Days Post-Op Procedure(s) (LRB): LEFT HEART CATH AND CORONARY ANGIOGRAPHY (N/A) Subjective:  Started on NTG drip last night and no CP, arm pain or jaw pain since. Wife stayed with him.  Objective: Vital signs in last 24 hours: Temp:  [97.3 F (36.3 C)-98.4 F (36.9 C)] 97.6 F (36.4 C) (05/12 0744) Pulse Rate:  [50-63] 50 (05/12 0744) Cardiac Rhythm: Heart block (05/12 0729) Resp:  [13-19] 18 (05/12 0744) BP: (102-139)/(51-68) 113/60 (05/12 0744) SpO2:  [92 %-96 %] 94 % (05/12 0744) Weight:  [90.5 kg] 90.5 kg (05/12 0500)  Hemodynamic parameters for last 24 hours:    Intake/Output from previous day: 05/11 0701 - 05/12 0700 In: 571.5 [P.O.:177; I.V.:394.5] Out: -  Intake/Output this shift: No intake/output data recorded.  General appearance: alert and cooperative Heart: regular rate and rhythm Lungs: clear to auscultation bilaterally  Lab Results: Recent Labs    03/11/23 0041 03/12/23 0049  WBC 5.5 7.7  HGB 11.9* 11.9*  HCT 36.7* 35.9*  PLT 122* 121*   BMET:  Recent Labs    03/10/23 0102  NA 141  K 3.4*  CL 111  CO2 21*  GLUCOSE 123*  BUN 15  CREATININE 1.08  CALCIUM 8.5*    PT/INR: No results for input(s): "LABPROT", "INR" in the last 72 hours. ABG No results found for: "PHART", "HCO3", "TCO2", "ACIDBASEDEF", "O2SAT" CBG (last 3)  No results for input(s): "GLUCAP" in the last 72 hours.  Assessment/Plan:  Severe multivessel CAD. Plan CABG in am. Continue heparin and NTG drips until surgery.   LOS: 3 days    Alleen Borne 03/12/2023

## 2023-03-12 NOTE — Anesthesia Preprocedure Evaluation (Addendum)
Anesthesia Evaluation  Patient identified by MRN, date of birth, ID band Patient awake    Reviewed: Allergy & Precautions, NPO status , Patient's Chart, lab work & pertinent test results  Airway Mallampati: II  TM Distance: >3 FB Neck ROM: Full    Dental  (+) Edentulous Upper, Edentulous Lower   Pulmonary former smoker   Pulmonary exam normal        Cardiovascular hypertension, Pt. on medications and Pt. on home beta blockers + CAD and + Past MI   Rhythm:Regular Rate:Normal  ECHO 05/24:  1. Left ventricular ejection fraction, by estimation, is 55 to 60%. The  left ventricle has normal function. The left ventricle demonstrates  regional wall motion abnormalities (see scoring diagram/findings for  description). There is mild left ventricular   hypertrophy. Left ventricular diastolic parameters were grossly normal.   2. Right ventricular systolic function is normal. The right ventricular  size is normal. There is normal pulmonary artery systolic pressure. The  estimated right ventricular systolic pressure is 20.1 mmHg.   3. The mitral valve is grossly normal. Trivial mitral valve  regurgitation. No evidence of mitral stenosis.   4. The aortic valve is tricuspid. There is mild calcification of the  aortic valve. Aortic valve regurgitation is not visualized. No aortic  stenosis is present.   5. The inferior vena cava is normal in size with greater than 50%  respiratory variability, suggesting right atrial pressure of 3 mmHg.     Cath 05/24:   Ost LM to Mid LM lesion is 60% stenosed.   Mid LAD lesion is 90% stenosed.   Ost Cx lesion is 60% stenosed.   1st Mrg lesion is 90% stenosed.   Mid Cx to Dist Cx lesion is 90% stenosed.   3rd Mrg lesion is 80% stenosed.   Previously placed Prox RCA to Mid RCA stent of unknown type is  widely patent.   The left ventricular systolic function is normal.   LV end diastolic pressure  is mildly elevated.   The left ventricular ejection fraction is 50-55% by visual estimate.      Neuro/Psych  negative psych ROS   GI/Hepatic negative GI ROS, Neg liver ROS,,,  Endo/Other  negative endocrine ROS    Renal/GU      Musculoskeletal  (+) Arthritis ,    Abdominal Normal abdominal exam  (+)   Peds  Hematology Lab Results      Component                Value               Date                      WBC                      7.7                 03/12/2023                HGB                      11.9 (L)            03/12/2023                HCT                      35.9 (  L)            03/12/2023                MCV                      94.7                03/12/2023                PLT                      121 (L)             03/12/2023             Lab Results      Component                Value               Date                      NA                       141                 03/10/2023                K                        3.4 (L)             03/10/2023                CO2                      21 (L)              03/10/2023                GLUCOSE                  123 (H)             03/10/2023                BUN                      15                  03/10/2023                CREATININE               1.08                03/10/2023                CALCIUM                  8.5 (L)             03/10/2023                GFRNONAA                 >60                 03/10/2023  Anesthesia Other Findings   Reproductive/Obstetrics                             Anesthesia Physical Anesthesia Plan  ASA: 4  Anesthesia Plan: General   Post-op Pain Management:    Induction: Intravenous  PONV Risk Score and Plan: 2 and Midazolam and Treatment may vary due to age or medical condition  Airway Management Planned: Mask and Oral ETT  Additional Equipment: Arterial line, CVP, PA Cath, TEE, 3D TEE and Ultrasound Guidance Line  Placement  Intra-op Plan:   Post-operative Plan: Post-operative intubation/ventilation  Informed Consent: I have reviewed the patients History and Physical, chart, labs and discussed the procedure including the risks, benefits and alternatives for the proposed anesthesia with the patient or authorized representative who has indicated his/her understanding and acceptance.     Dental advisory given  Plan Discussed with: CRNA  Anesthesia Plan Comments:        Anesthesia Quick Evaluation

## 2023-03-12 NOTE — Progress Notes (Signed)
3 Days Post-Op Procedure(s) (LRB): LEFT HEART CATH AND CORONARY ANGIOGRAPHY (N/A) Subjective:  Started on NTG drip last night and no CP, arm pain or jaw pain since. Wife stayed with him.  Objective: Vital signs in last 24 hours: Temp:  [97.3 F (36.3 C)-98.4 F (36.9 C)] 97.6 F (36.4 C) (05/12 0744) Pulse Rate:  [50-63] 50 (05/12 0744) Cardiac Rhythm: Heart block (05/12 0729) Resp:  [13-19] 18 (05/12 0744) BP: (102-139)/(51-68) 113/60 (05/12 0744) SpO2:  [92 %-96 %] 94 % (05/12 0744) Weight:  [90.5 kg] 90.5 kg (05/12 0500)  Hemodynamic parameters for last 24 hours:    Intake/Output from previous day: 05/11 0701 - 05/12 0700 In: 571.5 [P.O.:177; I.V.:394.5] Out: -  Intake/Output this shift: No intake/output data recorded.  General appearance: alert and cooperative Heart: regular rate and rhythm Lungs: clear to auscultation bilaterally  Lab Results: Recent Labs    03/11/23 0041 03/12/23 0049  WBC 5.5 7.7  HGB 11.9* 11.9*  HCT 36.7* 35.9*  PLT 122* 121*   BMET:  Recent Labs    03/10/23 0102  NA 141  Bruce 3.4*  CL 111  CO2 21*  GLUCOSE 123*  BUN 15  CREATININE 1.08  CALCIUM 8.5*    PT/INR: No results for input(s): "LABPROT", "INR" in the last 72 hours. ABG No results found for: "PHART", "HCO3", "TCO2", "ACIDBASEDEF", "O2SAT" CBG (last 3)  No results for input(s): "GLUCAP" in the last 72 hours.  Assessment/Plan:  Severe multivessel CAD. Plan CABG in am. Continue heparin and NTG drips until surgery.   LOS: 3 days    Bruce Smith Bruce Smith 03/12/2023   

## 2023-03-12 NOTE — Progress Notes (Signed)
Rounding Note    Patient Name: Bruce Smith Date of Encounter: 03/12/2023  [facility] Cardiologist: Lance Muss, MD   Subjective   Still having intermittent discomfort. On ultram, NTG drip  Inpatient Medications    Scheduled Meds:  acetaminophen  1,000 mg Oral TID AC & HS   aspirin  81 mg Oral Daily   calcium carbonate  1 tablet Oral BID WC   [START ON 03/13/2023] epinephrine  0-10 mcg/min Intravenous To OR   [START ON 03/13/2023] heparin sodium (porcine) 2,500 Units, papaverine 30 mg in electrolyte-A (PLASMALYTE-A PH 7.4) 500 mL irrigation   Irrigation To OR   [START ON 03/13/2023] insulin   Intravenous To OR   isosorbide mononitrate  30 mg Oral Daily   levETIRAcetam  500 mg Oral BID   magnesium oxide  200 mg Oral Daily   [START ON 03/13/2023] magnesium sulfate  40 mEq Other To OR   metoprolol succinate  25 mg Oral Daily   pantoprazole  40 mg Oral BID AC   [START ON 03/13/2023] phenylephrine  30-200 mcg/min Intravenous To OR   [START ON 03/13/2023] potassium chloride  80 mEq Other To OR   pregabalin  200 mg Oral BID   rosuvastatin  40 mg Oral Daily   saccharomyces boulardii  250 mg Oral Daily   sodium chloride flush  3 mL Intravenous Q12H   tamsulosin  0.4 mg Oral Daily   traMADol  50 mg Oral TID AC & HS   [START ON 03/13/2023] tranexamic acid  15 mg/kg Intravenous To OR   [START ON 03/13/2023] tranexamic acid  2 mg/kg Intracatheter To OR   Continuous Infusions:  sodium chloride     [START ON 03/13/2023]  ceFAZolin (ANCEF) IV     [START ON 03/13/2023]  ceFAZolin (ANCEF) IV     [START ON 03/13/2023] dexmedetomidine     [START ON 03/13/2023] heparin 30,000 units/NS 1000 mL solution for CELLSAVER     heparin 1,300 Units/hr (03/12/23 0251)   [START ON 03/13/2023] milrinone     [START ON 03/13/2023] nitroGLYCERIN     nitroGLYCERIN 10 mcg/min (03/12/23 0943)   [START ON 03/13/2023] norepinephrine     [START ON 03/13/2023] tranexamic acid (CYKLOKAPRON) 2,500 mg in  sodium chloride 0.9 % 250 mL (10 mg/mL) infusion     [START ON 03/13/2023] vancomycin     PRN Meds: sodium chloride, morphine injection, nitroGLYCERIN, ondansetron (ZOFRAN) IV, polyethylene glycol powder, sodium chloride flush   Vital Signs    Vitals:   03/12/23 0000 03/12/23 0400 03/12/23 0500 03/12/23 0744  BP: (!) 120/51 (!) 102/53  113/60  Pulse: 63 60  (!) 50  Resp:    18  Temp: 98.3 F (36.8 C) 98.4 F (36.9 C)  97.6 F (36.4 C)  TempSrc: Oral Oral  Oral  SpO2: 94% 92%  94%  Weight:   90.5 kg   Height:        Intake/Output Summary (Last 24 hours) at 03/12/2023 1012 Last data filed at 03/12/2023 0842 Gross per 24 hour  Intake 1166.45 ml  Output --  Net 1166.45 ml      03/12/2023    5:00 AM 03/11/2023    4:54 AM 03/10/2023    4:32 AM  Last 3 Weights  Weight (lbs) 199 lb 8.3 oz 202 lb 6.1 oz 202 lb 6.1 oz  Weight (kg) 90.5 kg 91.8 kg 91.8 kg      Telemetry    SR - Personally Reviewed  ECG  No new - Personally Reviewed  Physical Exam   GEN: No acute distress. Bruising over arms chronic  Neck: No JVD Cardiac: RRR, no murmurs, rubs, or gallops. Rt wrist cath site without hematoma  Respiratory: Clear to auscultation bilaterally. GI: Soft, nontender, non-distended  MS: No edema; No deformity. Neuro:  Nonfocal  Psych: Normal affect   Labs    High Sensitivity Troponin:   Recent Labs  Lab 03/08/23 1932 03/08/23 2320  TROPONINIHS 1,279* 7,465*     Chemistry Recent Labs  Lab 03/08/23 1932 03/09/23 0304 03/10/23 0102  NA 138 140 141  K 4.6 3.8 3.4*  CL 106 107 111  CO2 20* 25 21*  GLUCOSE 120* 105* 123*  BUN 20 21 15   CREATININE 1.11 1.20 1.08  CALCIUM 9.3 9.2 8.5*  GFRNONAA >60 >60 >60  ANIONGAP 12 8 9     Lipids  Recent Labs  Lab 03/09/23 0304  CHOL 173  TRIG 168*  HDL 41  LDLCALC 98  CHOLHDL 4.2    Hematology Recent Labs  Lab 03/10/23 0102 03/11/23 0041 03/12/23 0049  WBC 5.0 5.5 7.7  RBC 3.93* 3.74* 3.79*  HGB 12.6* 11.9*  11.9*  HCT 37.5* 36.7* 35.9*  MCV 95.4 98.1 94.7  MCH 32.1 31.8 31.4  MCHC 33.6 32.4 33.1  RDW 13.5 13.6 13.3  PLT 136* 122* 121*   Thyroid  Recent Labs  Lab 03/09/23 0304  TSH 2.847  FREET4 0.74    BNP Recent Labs  Lab 03/09/23 0304  BNP 123.9*    DDimer No results for input(s): "DDIMER" in the last 168 hours.   Radiology    VAS US DOPPLER PRE CABG  Result Date: 03/11/2023 PREOPERATIVE VASCULAR EVALUATION Patient Name:  ANZIO OREJEL  Date of Exam:   03/11/2023 Medical Rec #: [id number]     Accession #:    [id number] Date of Birth: Jan 02, 1951      Patient Gender: M Patient Age:   49 years Exam Location:  [facility] Procedure:      VAS US DOPPLER PRE CABG Referring Phys: Evelene Croon --------------------------------------------------------------------------------  Indications:      Pre-CABG. Risk Factors:     Hypertension, coronary artery disease. Comparison Study: No prior studies. Performing Technologist: Olen Cordial RVT  Examination Guidelines: A complete evaluation includes B-mode imaging, spectral Doppler, color Doppler, and power Doppler as needed of all accessible portions of each vessel. Bilateral testing is considered an integral part of a complete examination. Limited examinations for reoccurring indications may be performed as noted.  Right Carotid Findings: +----------+--------+--------+--------+-----------------------+--------+           PSV cm/sEDV cm/sStenosisDescribe               Comments +----------+--------+--------+--------+-----------------------+--------+ CCA Prox  96      13              smooth and heterogenous         +----------+--------+--------+--------+-----------------------+--------+ CCA Distal64      13              smooth and heterogenous         +----------+--------+--------+--------+-----------------------+--------+ ICA Prox  58      16              smooth and heterogenous          +----------+--------+--------+--------+-----------------------+--------+ ICA Mid   52      16              smooth and heterogenous         +----------+--------+--------+--------+-----------------------+--------+  ICA Distal45      17                                              +----------+--------+--------+--------+-----------------------+--------+ ECA       151     12                                              +----------+--------+--------+--------+-----------------------+--------+ +----------+--------+-------+--------+------------+           PSV cm/sEDV cmsDescribeArm Pressure +----------+--------+-------+--------+------------+ [id number]                                 +----------+--------+-------+--------+------------+ +---------+--------+--+--------+--+---------+ VertebralPSV cm/s44EDV cm/s10Antegrade +---------+--------+--+--------+--+---------+ Left Carotid Findings: +----------+--------+--------+--------+-----------------------+--------+           PSV cm/sEDV cm/sStenosisDescribe               Comments +----------+--------+--------+--------+-----------------------+--------+ CCA Prox  101     12              smooth and heterogenous         +----------+--------+--------+--------+-----------------------+--------+ CCA Distal81      12              smooth and heterogenous         +----------+--------+--------+--------+-----------------------+--------+ ICA Prox  43      9               smooth and heterogenous         +----------+--------+--------+--------+-----------------------+--------+ ICA Mid   39      11                                              +----------+--------+--------+--------+-----------------------+--------+ ICA Distal61      12                                     tortuous +----------+--------+--------+--------+-----------------------+--------+ ECA       167     12                                               +----------+--------+--------+--------+-----------------------+--------+  +----------+--------+--------+--------+------------+ SubclavianPSV cm/sEDV cm/sDescribeArm Pressure +----------+--------+--------+--------+------------+           118                                  +----------+--------+--------+--------+------------+ +---------+--------+--+--------+--+---------+ VertebralPSV cm/s34EDV cm/s12Antegrade +---------+--------+--+--------+--+---------+  ABI Findings: +--------+------------------+-----+-----------+--------+ Right   Rt Pressure (mmHg)IndexWaveform   Comment  +--------+------------------+-----+-----------+--------+ [id number]                    triphasic           +--------+------------------+-----+-----------+--------+ PTA     166               1.27 multiphasic         +--------+------------------+-----+-----------+--------+  DP      155               1.18 multiphasic         +--------+------------------+-----+-----------+--------+ +--------+------------------+-----+-----------+-------+ Left    Lt Pressure (mmHg)IndexWaveform   Comment +--------+------------------+-----+-----------+-------+ [id number]                    triphasic          +--------+------------------+-----+-----------+-------+ PTA     153               1.17 multiphasic        +--------+------------------+-----+-----------+-------+ DP      151               1.15 multiphasic        +--------+------------------+-----+-----------+-------+ +-------+---------------+----------------+ ABI/TBIToday's ABI/TBIPrevious ABI/TBI +-------+---------------+----------------+ Right  1.27                            +-------+---------------+----------------+ Left   1.17                            +-------+---------------+----------------+  Right Doppler Findings: +--------+--------+-----+---------+--------+ Site    PressureIndexDoppler  Comments  +--------+--------+-----+---------+--------+ [id number]          triphasic         +--------+--------+-----+---------+--------+ Radial               triphasic         +--------+--------+-----+---------+--------+ Ulnar                triphasic         +--------+--------+-----+---------+--------+  Left Doppler Findings: +--------+--------+-----+---------+--------+ Site    PressureIndexDoppler  Comments +--------+--------+-----+---------+--------+ [id number]          triphasic         +--------+--------+-----+---------+--------+ Radial               triphasic         +--------+--------+-----+---------+--------+ Ulnar                triphasic         +--------+--------+-----+---------+--------+   Summary: Right Carotid: Velocities in the right ICA are consistent with a 1-39% stenosis. Left Carotid: Velocities in the left ICA are consistent with a 1-39% stenosis. Vertebrals: Bilateral vertebral arteries demonstrate antegrade flow. Right ABI: Resting right ankle-brachial index is within normal range. Left ABI: Resting left ankle-brachial index is within normal range. Right Upper Extremity: Doppler waveform obliterate with right radial compression. Doppler waveforms remain within normal limits with right ulnar compression. Left Upper Extremity: Doppler waveforms remain within normal limits with left radial compression. Doppler waveforms decrease 50% with left ulnar compression.    Preliminary     Cardiac Studies   Cardiac Cath 03/09/23    Ost LM to Mid LM lesion is 60% stenosed.   Mid LAD lesion is 90% stenosed.   Ost Cx lesion is 60% stenosed.   1st Mrg lesion is 90% stenosed.   Mid Cx to Dist Cx lesion is 90% stenosed.   3rd Mrg lesion is 80% stenosed.   Previously placed Prox RCA to Mid RCA stent of unknown type is  widely patent.   The left ventricular systolic function is normal.   LV end diastolic pressure is mildly elevated.   The left ventricular ejection  fraction is 50-55% by visual estimate.     IMPRESSION: Mr. Huml presented with a non-STEMI with  troponins in the 7000 range.  He status post LAD, RCA and distal circumflex stenting remotely.  His cath revealed a moderate distal left main with diffuse in-stent restenosis within the mid LAD stent as well as disease in the first marginal branch, second marginal branch in the AV groove circumflex.  The distal stent in the PLA branch is occluded.  The stent in the right right right coronary artery is widely patent.  His anatomy is suitable for CABG.  The guidewire and catheter were removed.  The sheath was removed and a TR band was placed on the right wrist to achieve patent hemostasis.  The patient left lab in stable condition.  He will be started back on heparin 4 hours after sheath removal without bolus.  T CTS will be consulted.  The angiograms were reviewed with Dr. Excell Seltzer as well.   Diagnostic Dominance: Co-dominant  IMPRESSIONS     1. Left ventricular ejection fraction, by estimation, is 55 to 60%. The  left ventricle has normal function. The left ventricle demonstrates  regional wall motion abnormalities (see scoring diagram/findings for  description). There is mild left ventricular   hypertrophy. Left ventricular diastolic parameters were grossly normal.   2. Right ventricular systolic function is normal. The right ventricular  size is normal. There is normal pulmonary artery systolic pressure. The  estimated right ventricular systolic pressure is 20.1 mmHg.   3. The mitral valve is grossly normal. Trivial mitral valve  regurgitation. No evidence of mitral stenosis.   4. The aortic valve is tricuspid. There is mild calcification of the  aortic valve. Aortic valve regurgitation is not visualized. No aortic  stenosis is present.   5. The inferior vena cava is normal in size with greater than 50%  respiratory variability, suggesting right atrial pressure of 3 mmHg.   FINDINGS   Left  Ventricle: Left ventricular ejection fraction, by estimation, is 55  to 60%. The left ventricle has normal function. The left ventricle  demonstrates regional wall motion abnormalities. Definity contrast agent  was given IV to delineate the left  ventricular endocardial borders. The left ventricular internal cavity size  was normal in size. There is mild left ventricular hypertrophy. Left  ventricular diastolic parameters were normal.     LV Wall Scoring:  The apical lateral segment, apical septal segment, and apical anterior  segment are hypokinetic.   Right Ventricle: The right ventricular size is normal. No increase in  right ventricular wall thickness. Right ventricular systolic function is  normal. There is normal pulmonary artery systolic pressure. The tricuspid  regurgitant velocity is 2.07 m/s, and   with an assumed right atrial pressure of 3 mmHg, the estimated right  ventricular systolic pressure is 20.1 mmHg.   Left Atrium: Left atrial size was normal in size.   Right Atrium: Right atrial size was normal in size.   Pericardium: Trivial pericardial effusion is present.   Mitral Valve: The mitral valve is grossly normal. Trivial mitral valve  regurgitation. No evidence of mitral valve stenosis.   Tricuspid Valve: The tricuspid valve is normal in structure. Tricuspid  valve regurgitation is mild . No evidence of tricuspid stenosis.   Aortic Valve: The aortic valve is tricuspid. There is mild calcification  of the aortic valve. Aortic valve regurgitation is not visualized. No  aortic stenosis is present.   Pulmonic Valve: The pulmonic valve was normal in structure. Pulmonic valve  regurgitation is trivial. No evidence of pulmonic stenosis.   Aorta: The aortic root  is normal in size and structure.   Venous: The inferior vena cava is normal in size with greater than 50%  respiratory variability, suggesting right atrial pressure of 3 mmHg.   IAS/Shunts: No atrial level  shunt detected by color flow Doppler.       Patient Profile     72 y.o. male with PMH of CAD s/p prior RCA, LAD and PDA stents, HTN, HLD, multiple myeloma with prior stem cell transplant who presented with chest pain and elevated troponins.   Assessment & Plan    NSTEMI/CAD with prior stents to RCA. LAD and PDA --cardiac cath with 60% LM, 90% LAD, LCX 90%  RCA stent previously placed is patent.  --TCTS consult - surgery scheduled for Monday --IV heparin resumed.  --IV NTG adjusting as necessary - up to 10 this afternoon --can add morphine if needed --Echo with EF 55-60%   Diarrhea --resolved. Enteric precautions lifted --ultram causes constipation so on   HTN  --toprol 25 daily  HLD --LDL 98 HDL 41  --crestor 40 daily   Multiple myeloma --prior stem cell transplant  --initially successful but now with recurrence of myeloma at 2 sites in pelvis.  They have been irradiated and taken care of .  --chronic Lt shoulder pain on tylenol and ultram - have reordered   Chronic pain Lt shoulder pain --on tylenol and ultram re-odered AC and HS    For questions or updates, please contact [facility] Please consult [email or web address] for contact info under        Signed, Maurice Small, MD  03/12/2023, 10:12 AM

## 2023-03-12 NOTE — Progress Notes (Signed)
ANTICOAGULATION CONSULT NOTE   Pharmacy Consult for heparin Indication: chest pain/ACS  No Known Allergies  Patient Measurements: Height: 5\' 8"  (172.7 cm) Weight: 90.5 kg (199 lb 8.3 oz) IBW/kg (Calculated) : 68.4 Heparin Dosing Weight: 87.2 kg  Vital Signs: Temp: 98.4 F (36.9 C) (05/12 0400) Temp Source: Oral (05/12 0400) BP: 102/53 (05/12 0400) Pulse Rate: 60 (05/12 0400)  Labs: Recent Labs    03/10/23 0102 03/10/23 1831 03/11/23 0041 03/12/23 0049  HGB 12.6*  --  11.9* 11.9*  HCT 37.5*  --  36.7* 35.9*  PLT 136*  --  122* 121*  HEPARINUNFRC  --  0.35 0.41 0.31  CREATININE 1.08  --   --   --      Estimated Creatinine Clearance: 68.5 mL/min (by C-G formula based on SCr of 1.08 mg/dL).   Medical History: Past Medical History:  Diagnosis Date   Atherosclerosis    Coronary artery disease    Goals of care, counseling/discussion 06/29/2020   History of radiation therapy    Left Humerus, T-spine, Pelvis- 09/19/22-10/04/22- Dr. Antony Blackbird   History of radiation therapy    Right Femur- 01/15/23-01/16/23- Dr. 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   Multiple myeloma (HCC) 07/21/2020   Myocardial infarction (HCC)     Medications:   Scheduled:   acetaminophen  1,000 mg Oral TID AC & HS   aspirin  81 mg Oral Daily   calcium carbonate  1 tablet Oral BID WC   [START ON 03/13/2023] epinephrine  0-10 mcg/min Intravenous To OR   [START ON 03/13/2023] heparin sodium (porcine) 2,500 Units, papaverine 30 mg in electrolyte-A (PLASMALYTE-A PH 7.4) 500 mL irrigation   Irrigation To OR   [START ON 03/13/2023] insulin   Intravenous To OR   isosorbide mononitrate  30 mg Oral Daily   levETIRAcetam  500 mg Oral BID   magnesium oxide  200 mg Oral Daily   [START ON 03/13/2023] magnesium sulfate  40 mEq Other To OR   metoprolol succinate  25 mg Oral Daily    pantoprazole  40 mg Oral BID AC   [START ON 03/13/2023] phenylephrine  30-200 mcg/min Intravenous To OR   [START ON 03/13/2023] potassium chloride  80 mEq Other To OR   pregabalin  200 mg Oral BID   rosuvastatin  40 mg Oral Daily   saccharomyces boulardii  250 mg Oral Daily   sodium chloride flush  3 mL Intravenous Q12H   tamsulosin  0.4 mg Oral Daily   traMADol  50 mg Oral TID AC & HS   [START ON 03/13/2023] tranexamic acid  15 mg/kg Intravenous To OR   [START ON 03/13/2023] tranexamic acid  2 mg/kg Intracatheter To OR   Infusions:   sodium chloride     [START ON 03/13/2023]  ceFAZolin (ANCEF) IV     [START ON 03/13/2023]  ceFAZolin (ANCEF) IV     [START ON 03/13/2023] dexmedetomidine     [START ON 03/13/2023] heparin 30,000 units/NS 1000 mL solution for CELLSAVER     heparin 1,300 Units/hr (03/12/23 0251)   [START ON 03/13/2023] milrinone     [START ON 03/13/2023] nitroGLYCERIN     nitroGLYCERIN 5 mcg/min (03/11/23 2141)   [START ON 03/13/2023] norepinephrine     [START ON 03/13/2023] tranexamic acid (CYKLOKAPRON) 2,500 mg in sodium chloride 0.9 % 250 mL (10 mg/mL) infusion     [  START ON 03/13/2023] vancomycin      Assessment: 71 yom presenting with chest pain. Previous MI s/p 3 stents. No anticoag PTA. S/p cath yesterday with diffuse CAD, pharmacy asked to resume IV heparin.   Daily heparin level therapeutic at 0.31. Hgb 11.9, PLT 121 stable. No bleed issues or issues with heparin infusion noted.   Goal of Therapy:  Heparin level 0.3-0.7 units/ml Monitor platelets by anticoagulation protocol: Yes   Plan:  Continue IV heparin at 1300 units/hr Daily heparin level and CBC  Monitor daily CBC, s/sx bleeding Noted CABG planned for 5/13  Larena Sox, PharmD PGY1 Pharmacy Resident   03/12/2023  7:20 AM

## 2023-03-13 ENCOUNTER — Inpatient Hospital Stay (HOSPITAL_COMMUNITY): Payer: Medicare Other

## 2023-03-13 ENCOUNTER — Inpatient Hospital Stay (HOSPITAL_COMMUNITY)
Admission: EM | Disposition: A | Discharge status: Home Health | Payer: Self-pay | Source: Home / Self Care | Attending: Surgery

## 2023-03-13 ENCOUNTER — Inpatient Hospital Stay (HOSPITAL_COMMUNITY): Payer: Medicare Other | Admitting: Anesthesiology

## 2023-03-13 ENCOUNTER — Encounter (HOSPITAL_COMMUNITY): Payer: Self-pay | Admitting: Internal Medicine

## 2023-03-13 ENCOUNTER — Other Ambulatory Visit: Payer: Self-pay

## 2023-03-13 DIAGNOSIS — Z87891 Personal history of nicotine dependence: Secondary | ICD-10-CM

## 2023-03-13 DIAGNOSIS — I10 Essential (primary) hypertension: Secondary | ICD-10-CM

## 2023-03-13 DIAGNOSIS — I252 Old myocardial infarction: Secondary | ICD-10-CM

## 2023-03-13 DIAGNOSIS — I2510 Atherosclerotic heart disease of native coronary artery without angina pectoris: Secondary | ICD-10-CM | POA: Diagnosis present

## 2023-03-13 HISTORY — PX: CORONARY ARTERY BYPASS GRAFT: SHX141

## 2023-03-13 HISTORY — PX: TEE WITHOUT CARDIOVERSION: SHX5443

## 2023-03-13 LAB — PLATELET COUNT: Platelets: 81 10*3/uL — ABNORMAL LOW (ref 150–400)

## 2023-03-13 LAB — BASIC METABOLIC PANEL
Anion gap: 7 (ref 5–15)
BUN: 12 mg/dL (ref 8–23)
CO2: 21 mmol/L — ABNORMAL LOW (ref 22–32)
Calcium: 7.7 mg/dL — ABNORMAL LOW (ref 8.9–10.3)
Chloride: 108 mmol/L (ref 98–111)
Creatinine, Ser: 0.99 mg/dL (ref 0.61–1.24)
GFR, Estimated: >60 mL/min (ref >60)
Glucose, Bld: 118 mg/dL — ABNORMAL HIGH (ref 70–99)
Potassium: 4.3 mmol/L (ref 3.5–5.1)
Sodium: 136 mmol/L (ref 135–145)

## 2023-03-13 LAB — POCT I-STAT, CHEM 8
BUN: 12 mg/dL (ref 8–23)
BUN: 15 mg/dL (ref 8–23)
BUN: 13 mg/dL (ref 8–23)
BUN: 13 mg/dL (ref 8–23)
Calcium, Ion: 1.33 mmol/L (ref 1.15–1.40)
Calcium, Ion: 1.24 mmol/L (ref 1.15–1.40)
Calcium, Ion: 1.12 mmol/L — ABNORMAL LOW (ref 1.15–1.40)
Calcium, Ion: 1.16 mmol/L (ref 1.15–1.40)
Chloride: 105 mmol/L (ref 98–111)
Chloride: 105 mmol/L (ref 98–111)
Chloride: 104 mmol/L (ref 98–111)
Chloride: 105 mmol/L (ref 98–111)
Creatinine, Ser: 0.8 mg/dL (ref 0.61–1.24)
Creatinine, Ser: 0.8 mg/dL (ref 0.61–1.24)
Creatinine, Ser: 0.7 mg/dL (ref 0.61–1.24)
Creatinine, Ser: 0.7 mg/dL (ref 0.61–1.24)
Glucose, Bld: 101 mg/dL — ABNORMAL HIGH (ref 70–99)
Glucose, Bld: 105 mg/dL — ABNORMAL HIGH (ref 70–99)
Glucose, Bld: 113 mg/dL — ABNORMAL HIGH (ref 70–99)
Glucose, Bld: 114 mg/dL — ABNORMAL HIGH (ref 70–99)
HCT: 32 % — ABNORMAL LOW (ref 39.0–52.0)
HCT: 30 % — ABNORMAL LOW (ref 39.0–52.0)
HCT: 26 % — ABNORMAL LOW (ref 39.0–52.0)
HCT: 32 % — ABNORMAL LOW (ref 39.0–52.0)
Hemoglobin: 10.9 g/dL — ABNORMAL LOW (ref 13.0–17.0)
Hemoglobin: 10.2 g/dL — ABNORMAL LOW (ref 13.0–17.0)
Hemoglobin: 8.8 g/dL — ABNORMAL LOW (ref 13.0–17.0)
Hemoglobin: 10.9 g/dL — ABNORMAL LOW (ref 13.0–17.0)
Potassium: 4 mmol/L (ref 3.5–5.1)
Potassium: 5.1 mmol/L (ref 3.5–5.1)
Potassium: 5.3 mmol/L — ABNORMAL HIGH (ref 3.5–5.1)
Potassium: 4.7 mmol/L (ref 3.5–5.1)
Sodium: 139 mmol/L (ref 135–145)
Sodium: 137 mmol/L (ref 135–145)
Sodium: 139 mmol/L (ref 135–145)
Sodium: 139 mmol/L (ref 135–145)
TCO2: 27 mmol/L (ref 22–32)
TCO2: 26 mmol/L (ref 22–32)
TCO2: 26 mmol/L (ref 22–32)
TCO2: 24 mmol/L (ref 22–32)

## 2023-03-13 LAB — POCT I-STAT 7, (LYTES, BLD GAS, ICA,H+H)
Acid-base deficit: 3 mmol/L — ABNORMAL HIGH (ref 0.0–2.0)
Acid-base deficit: 1 mmol/L (ref 0.0–2.0)
Acid-base deficit: 1 mmol/L (ref 0.0–2.0)
Acid-base deficit: 2 mmol/L (ref 0.0–2.0)
Acid-base deficit: 3 mmol/L — ABNORMAL HIGH (ref 0.0–2.0)
Acid-base deficit: 5 mmol/L — ABNORMAL HIGH (ref 0.0–2.0)
Acid-base deficit: 5 mmol/L — ABNORMAL HIGH (ref 0.0–2.0)
Bicarbonate: 23.5 mmol/L (ref 20.0–28.0)
Bicarbonate: 23.6 mmol/L (ref 20.0–28.0)
Bicarbonate: 23.1 mmol/L (ref 20.0–28.0)
Bicarbonate: 24 mmol/L (ref 20.0–28.0)
Bicarbonate: 23.3 mmol/L (ref 20.0–28.0)
Bicarbonate: 21.1 mmol/L (ref 20.0–28.0)
Bicarbonate: 20.3 mmol/L (ref 20.0–28.0)
Calcium, Ion: 1.31 mmol/L (ref 1.15–1.40)
Calcium, Ion: 0.94 mmol/L — ABNORMAL LOW (ref 1.15–1.40)
Calcium, Ion: 1.14 mmol/L — ABNORMAL LOW (ref 1.15–1.40)
Calcium, Ion: 1.19 mmol/L (ref 1.15–1.40)
Calcium, Ion: 1.17 mmol/L (ref 1.15–1.40)
Calcium, Ion: 1.16 mmol/L (ref 1.15–1.40)
Calcium, Ion: 1.16 mmol/L (ref 1.15–1.40)
HCT: 34 % — ABNORMAL LOW (ref 39.0–52.0)
HCT: 23 % — ABNORMAL LOW (ref 39.0–52.0)
HCT: 27 % — ABNORMAL LOW (ref 39.0–52.0)
HCT: 28 % — ABNORMAL LOW (ref 39.0–52.0)
HCT: 30 % — ABNORMAL LOW (ref 39.0–52.0)
HCT: 32 % — ABNORMAL LOW (ref 39.0–52.0)
HCT: 31 % — ABNORMAL LOW (ref 39.0–52.0)
Hemoglobin: 11.6 g/dL — ABNORMAL LOW (ref 13.0–17.0)
Hemoglobin: 7.8 g/dL — ABNORMAL LOW (ref 13.0–17.0)
Hemoglobin: 9.2 g/dL — ABNORMAL LOW (ref 13.0–17.0)
Hemoglobin: 9.5 g/dL — ABNORMAL LOW (ref 13.0–17.0)
Hemoglobin: 10.2 g/dL — ABNORMAL LOW (ref 13.0–17.0)
Hemoglobin: 10.9 g/dL — ABNORMAL LOW (ref 13.0–17.0)
Hemoglobin: 10.5 g/dL — ABNORMAL LOW (ref 13.0–17.0)
O2 Saturation: 100 %
O2 Saturation: 100 %
O2 Saturation: 100 %
O2 Saturation: 99 %
O2 Saturation: 98 %
O2 Saturation: 99 %
O2 Saturation: 95 %
Patient temperature: 35.5
Patient temperature: 37.2
Patient temperature: 38.1
Potassium: 4 mmol/L (ref 3.5–5.1)
Potassium: 4.5 mmol/L (ref 3.5–5.1)
Potassium: 4.7 mmol/L (ref 3.5–5.1)
Potassium: 5.2 mmol/L — ABNORMAL HIGH (ref 3.5–5.1)
Potassium: 4.1 mmol/L (ref 3.5–5.1)
Potassium: 4.2 mmol/L (ref 3.5–5.1)
Potassium: 4.1 mmol/L (ref 3.5–5.1)
Sodium: 138 mmol/L (ref 135–145)
Sodium: 139 mmol/L (ref 135–145)
Sodium: 137 mmol/L (ref 135–145)
Sodium: 139 mmol/L (ref 135–145)
Sodium: 139 mmol/L (ref 135–145)
Sodium: 139 mmol/L (ref 135–145)
Sodium: 139 mmol/L (ref 135–145)
TCO2: 25 mmol/L (ref 22–32)
TCO2: 25 mmol/L (ref 22–32)
TCO2: 24 mmol/L (ref 22–32)
TCO2: 25 mmol/L (ref 22–32)
TCO2: 25 mmol/L (ref 22–32)
TCO2: 22 mmol/L (ref 22–32)
TCO2: 21 mmol/L — ABNORMAL LOW (ref 22–32)
pCO2 arterial: 46.6 mmHg (ref 32–48)
pCO2 arterial: 38.9 mmHg (ref 32–48)
pCO2 arterial: 38.9 mmHg (ref 32–48)
pCO2 arterial: 42.2 mmHg (ref 32–48)
pCO2 arterial: 42.2 mmHg (ref 32–48)
pCO2 arterial: 41.3 mmHg (ref 32–48)
pCO2 arterial: 40.7 mmHg (ref 32–48)
pH, Arterial: 7.31 — ABNORMAL LOW (ref 7.35–7.45)
pH, Arterial: 7.392 (ref 7.35–7.45)
pH, Arterial: 7.363 (ref 7.35–7.45)
pH, Arterial: 7.382 (ref 7.35–7.45)
pH, Arterial: 7.342 — ABNORMAL LOW (ref 7.35–7.45)
pH, Arterial: 7.318 — ABNORMAL LOW (ref 7.35–7.45)
pH, Arterial: 7.311 — ABNORMAL LOW (ref 7.35–7.45)
pO2, Arterial: 469 mmHg — ABNORMAL HIGH (ref 83–108)
pO2, Arterial: 458 mmHg — ABNORMAL HIGH (ref 83–108)
pO2, Arterial: 155 mmHg — ABNORMAL HIGH (ref 83–108)
pO2, Arterial: 334 mmHg — ABNORMAL HIGH (ref 83–108)
pO2, Arterial: 114 mmHg — ABNORMAL HIGH (ref 83–108)
pO2, Arterial: 136 mmHg — ABNORMAL HIGH (ref 83–108)
pO2, Arterial: 89 mmHg (ref 83–108)

## 2023-03-13 LAB — CBC
HCT: 31.1 % — ABNORMAL LOW (ref 39.0–52.0)
HCT: 31.2 % — ABNORMAL LOW (ref 39.0–52.0)
Hemoglobin: 9.9 g/dL — ABNORMAL LOW (ref 13.0–17.0)
Hemoglobin: 10.2 g/dL — ABNORMAL LOW (ref 13.0–17.0)
MCH: 31.3 pg (ref 26.0–34.0)
MCH: 31.6 pg (ref 26.0–34.0)
MCHC: 31.8 g/dL (ref 30.0–36.0)
MCHC: 32.7 g/dL (ref 30.0–36.0)
MCV: 98.4 fL (ref 80.0–100.0)
MCV: 96.6 fL (ref 80.0–100.0)
Platelets: 80 10*3/uL — ABNORMAL LOW (ref 150–400)
Platelets: 103 10*3/uL — ABNORMAL LOW (ref 150–400)
RBC: 3.16 MIL/uL — ABNORMAL LOW (ref 4.22–5.81)
RBC: 3.23 MIL/uL — ABNORMAL LOW (ref 4.22–5.81)
RDW: 13.3 % (ref 11.5–15.5)
RDW: 13.4 % (ref 11.5–15.5)
WBC: 6.3 10*3/uL (ref 4.0–10.5)
WBC: 9.2 10*3/uL (ref 4.0–10.5)
nRBC: 0 % (ref 0.0–0.2)
nRBC: 0 % (ref 0.0–0.2)

## 2023-03-13 LAB — GLUCOSE, CAPILLARY
Glucose-Capillary: 128 mg/dL — ABNORMAL HIGH (ref 70–99)
Glucose-Capillary: 128 mg/dL — ABNORMAL HIGH (ref 70–99)
Glucose-Capillary: 125 mg/dL — ABNORMAL HIGH (ref 70–99)
Glucose-Capillary: 140 mg/dL — ABNORMAL HIGH (ref 70–99)
Glucose-Capillary: 129 mg/dL — ABNORMAL HIGH (ref 70–99)
Glucose-Capillary: 135 mg/dL — ABNORMAL HIGH (ref 70–99)
Glucose-Capillary: 118 mg/dL — ABNORMAL HIGH (ref 70–99)
Glucose-Capillary: 130 mg/dL — ABNORMAL HIGH (ref 70–99)

## 2023-03-13 LAB — BLOOD GAS, ARTERIAL
Acid-Base Excess: 0.7 mmol/L (ref 0.0–2.0)
Bicarbonate: 25.4 mmol/L (ref 20.0–28.0)
O2 Saturation: 99.4 %
Patient temperature: 36.8
pCO2 arterial: 40 mmHg (ref 32–48)
pH, Arterial: 7.41 (ref 7.35–7.45)
pO2, Arterial: 84 mmHg (ref 83–108)

## 2023-03-13 LAB — TYPE AND SCREEN
ABO/RH(D): O POS
Antibody Screen: POSITIVE

## 2023-03-13 LAB — POCT I-STAT EG7
Acid-base deficit: 3 mmol/L — ABNORMAL HIGH (ref 0.0–2.0)
Bicarbonate: 22.1 mmol/L (ref 20.0–28.0)
Calcium, Ion: 1.02 mmol/L — ABNORMAL LOW (ref 1.15–1.40)
HCT: 25 % — ABNORMAL LOW (ref 39.0–52.0)
Hemoglobin: 8.5 g/dL — ABNORMAL LOW (ref 13.0–17.0)
O2 Saturation: 90 %
Potassium: 4.4 mmol/L (ref 3.5–5.1)
Sodium: 140 mmol/L (ref 135–145)
TCO2: 23 mmol/L (ref 22–32)
pCO2, Ven: 37.7 mmHg — ABNORMAL LOW (ref 44–60)
pH, Ven: 7.376 (ref 7.25–7.43)
pO2, Ven: 59 mmHg — ABNORMAL HIGH (ref 32–45)

## 2023-03-13 LAB — APTT: aPTT: 35 seconds (ref 24–36)

## 2023-03-13 LAB — HEMOGLOBIN AND HEMATOCRIT, BLOOD
HCT: 26.7 % — ABNORMAL LOW (ref 39.0–52.0)
Hemoglobin: 8.8 g/dL — ABNORMAL LOW (ref 13.0–17.0)

## 2023-03-13 LAB — BPAM RBC
Blood Product Expiration Date: 202406112359
Unit Type and Rh: 5100
Unit Type and Rh: 5100

## 2023-03-13 LAB — MAGNESIUM: Magnesium: 2.5 mg/dL — ABNORMAL HIGH (ref 1.7–2.4)

## 2023-03-13 LAB — PREPARE PLATELET PHERESIS

## 2023-03-13 LAB — ECHO INTRAOPERATIVE TEE: Weight: 3180.8 oz

## 2023-03-13 LAB — PROTIME-INR
INR: 1.2 (ref 0.8–1.2)
Prothrombin Time: 15.6 seconds — ABNORMAL HIGH (ref 11.4–15.2)

## 2023-03-13 LAB — BPAM PLATELET PHERESIS
Blood Product Expiration Date: 202405162359
ISSUE DATE / TIME: 202405131104

## 2023-03-13 SURGERY — CORONARY ARTERY BYPASS GRAFTING (CABG)
Anesthesia: General | Site: Chest

## 2023-03-13 MED ORDER — MIDAZOLAM HCL (PF) 5 MG/ML IJ SOLN
INTRAMUSCULAR | Status: DC | PRN
  Administered 2023-03-13: 1 mg via INTRAVENOUS
  Administered 2023-03-13: 2 mg via INTRAVENOUS
  Administered 2023-03-13: 3 mg via INTRAVENOUS
  Administered 2023-03-13: 1 mg via INTRAVENOUS

## 2023-03-13 MED ORDER — METOPROLOL TARTRATE 25 MG/10 ML ORAL SUSPENSION
12.5000 mg | Freq: Two times a day (BID) | ORAL | Status: DC
  Administered 2023-03-18: 12.5 mg
  Filled 2023-03-13 (×10): qty 5

## 2023-03-13 MED ORDER — ACETAMINOPHEN 500 MG PO TABS
1000.0000 mg | ORAL_TABLET | Freq: Four times a day (QID) | ORAL | Status: AC
  Administered 2023-03-13 – 2023-03-18 (×19): 1000 mg via ORAL
  Filled 2023-03-13 (×19): qty 2

## 2023-03-13 MED ORDER — CHLORHEXIDINE GLUCONATE 0.12 % MT SOLN
15.0000 mL | OROMUCOSAL | Status: AC
  Administered 2023-03-13: 15 mL via OROMUCOSAL
  Filled 2023-03-13: qty 15

## 2023-03-13 MED ORDER — HEPARIN SODIUM (PORCINE) 1000 UNIT/ML IJ SOLN
INTRAMUSCULAR | Status: DC | PRN
  Administered 2023-03-13: 32000 [IU] via INTRAVENOUS

## 2023-03-13 MED ORDER — NOREPINEPHRINE 4 MG/250ML-% IV SOLN
INTRAVENOUS | Status: AC
  Filled 2023-03-13: qty 250

## 2023-03-13 MED ORDER — SODIUM CHLORIDE 0.9% FLUSH: 3.0000 mL | INTRAVENOUS | Status: DC | PRN

## 2023-03-13 MED ORDER — DOCUSATE SODIUM 100 MG PO CAPS
200.0000 mg | ORAL_CAPSULE | Freq: Every day | ORAL | Status: DC
  Administered 2023-03-14 – 2023-03-16 (×3): 200 mg via ORAL
  Filled 2023-03-13 (×6): qty 2

## 2023-03-13 MED ORDER — LACTATED RINGERS IV SOLN: 500.0000 mL | Freq: Once | INTRAVENOUS | Status: DC | PRN

## 2023-03-13 MED ORDER — DEXTROSE 50 % IV SOLN: 0.0000 mL | INTRAVENOUS | Status: DC | PRN

## 2023-03-13 MED ORDER — CEFAZOLIN SODIUM-DEXTROSE 2-4 GM/100ML-% IV SOLN
2.0000 g | Freq: Three times a day (TID) | INTRAVENOUS | Status: AC
  Administered 2023-03-13 – 2023-03-15 (×6): 2 g via INTRAVENOUS
  Filled 2023-03-13 (×6): qty 100

## 2023-03-13 MED ORDER — LACTATED RINGERS IV SOLN: INTRAVENOUS | Status: DC

## 2023-03-13 MED ORDER — THROMBIN (RECOMBINANT) 20000 UNITS EX SOLR
CUTANEOUS | Status: AC
  Filled 2023-03-13: qty 20000

## 2023-03-13 MED ORDER — SODIUM CHLORIDE 0.9 % IV SOLN: INTRAVENOUS | Status: DC

## 2023-03-13 MED ORDER — PROPOFOL 10 MG/ML IV BOLUS
INTRAVENOUS | Status: DC | PRN
  Administered 2023-03-13: 30 mg via INTRAVENOUS
  Administered 2023-03-13: 100 mg via INTRAVENOUS
  Administered 2023-03-13: 40 mg via INTRAVENOUS

## 2023-03-13 MED ORDER — SODIUM CHLORIDE 0.45 % IV SOLN: INTRAVENOUS | Status: DC | PRN

## 2023-03-13 MED ORDER — BISACODYL 5 MG PO TBEC
10.0000 mg | DELAYED_RELEASE_TABLET | Freq: Every day | ORAL | Status: DC
  Administered 2023-03-14 – 2023-03-16 (×3): 10 mg via ORAL
  Filled 2023-03-13 (×4): qty 2

## 2023-03-13 MED ORDER — MIDAZOLAM HCL (PF) 10 MG/2ML IJ SOLN
INTRAMUSCULAR | Status: AC
  Filled 2023-03-13: qty 2

## 2023-03-13 MED ORDER — HEPARIN SODIUM (PORCINE) 1000 UNIT/ML IJ SOLN
INTRAMUSCULAR | Status: AC
  Filled 2023-03-13: qty 1

## 2023-03-13 MED ORDER — ONDANSETRON HCL 4 MG/2ML IJ SOLN
INTRAMUSCULAR | Status: AC
  Filled 2023-03-13: qty 4

## 2023-03-13 MED ORDER — ALBUMIN HUMAN 5 % IV SOLN
250.0000 mL | INTRAVENOUS | Status: AC | PRN
  Administered 2023-03-13 (×2): 12.5 g via INTRAVENOUS
  Filled 2023-03-13: qty 250

## 2023-03-13 MED ORDER — ROCURONIUM BROMIDE 10 MG/ML (PF) SYRINGE
PREFILLED_SYRINGE | INTRAVENOUS | Status: DC | PRN
  Administered 2023-03-13: 50 mg via INTRAVENOUS
  Administered 2023-03-13: 30 mg via INTRAVENOUS
  Administered 2023-03-13: 50 mg via INTRAVENOUS
  Administered 2023-03-13: 70 mg via INTRAVENOUS

## 2023-03-13 MED ORDER — SODIUM CHLORIDE 0.9 % IV SOLN: INTRAVENOUS | Status: DC | PRN

## 2023-03-13 MED ORDER — HEPARIN SODIUM (PORCINE) 1000 UNIT/ML IJ SOLN
INTRAMUSCULAR | Status: AC
  Filled 2023-03-13: qty 10

## 2023-03-13 MED ORDER — ACETAMINOPHEN 160 MG/5ML PO SOLN
650.0000 mg | Freq: Once | ORAL | Status: AC
  Administered 2023-03-13: 650 mg
  Filled 2023-03-13: qty 20.3

## 2023-03-13 MED ORDER — THROMBIN 20000 UNITS EX SOLR
CUTANEOUS | Status: DC | PRN
  Administered 2023-03-13: 20000 [IU] via TOPICAL

## 2023-03-13 MED ORDER — INSULIN REGULAR(HUMAN) IN NACL 100-0.9 UT/100ML-% IV SOLN
INTRAVENOUS | Status: DC
  Administered 2023-03-13: 1.1 [IU]/h via INTRAVENOUS

## 2023-03-13 MED ORDER — MAGNESIUM OXIDE -MG SUPPLEMENT 400 (240 MG) MG PO TABS
200.0000 mg | ORAL_TABLET | Freq: Every day | ORAL | Status: DC
  Administered 2023-03-15 – 2023-03-20 (×5): 200 mg via ORAL
  Filled 2023-03-13 (×6): qty 1

## 2023-03-13 MED ORDER — PROPOFOL 10 MG/ML IV BOLUS
INTRAVENOUS | Status: AC
  Filled 2023-03-13: qty 20

## 2023-03-13 MED ORDER — ACETAMINOPHEN 160 MG/5ML PO SOLN: 1000.0000 mg | Freq: Four times a day (QID) | ORAL | Status: AC

## 2023-03-13 MED ORDER — FENTANYL CITRATE (PF) 250 MCG/5ML IJ SOLN
INTRAMUSCULAR | Status: AC
  Filled 2023-03-13: qty 5

## 2023-03-13 MED ORDER — CHLORHEXIDINE GLUCONATE CLOTH 2 % EX PADS
6.0000 | MEDICATED_PAD | Freq: Every day | CUTANEOUS | Status: DC
  Administered 2023-03-13 – 2023-03-16 (×4): 6 via TOPICAL

## 2023-03-13 MED ORDER — ONDANSETRON HCL 4 MG/2ML IJ SOLN
4.0000 mg | Freq: Four times a day (QID) | INTRAMUSCULAR | Status: DC | PRN
  Administered 2023-03-14 – 2023-03-21 (×6): 4 mg via INTRAVENOUS
  Filled 2023-03-13 (×6): qty 2

## 2023-03-13 MED ORDER — PROTAMINE SULFATE 10 MG/ML IV SOLN
INTRAVENOUS | Status: DC | PRN
  Administered 2023-03-13: 300 mg via INTRAVENOUS

## 2023-03-13 MED ORDER — ACETAMINOPHEN 650 MG RE SUPP: 650.0000 mg | Freq: Once | RECTAL | Status: AC

## 2023-03-13 MED ORDER — PANTOPRAZOLE SODIUM 40 MG PO TBEC
40.0000 mg | DELAYED_RELEASE_TABLET | Freq: Two times a day (BID) | ORAL | Status: DC
  Administered 2023-03-14 – 2023-03-22 (×17): 40 mg via ORAL
  Filled 2023-03-13 (×17): qty 1

## 2023-03-13 MED ORDER — LACTATED RINGERS IV SOLN: INTRAVENOUS | Status: DC | PRN

## 2023-03-13 MED ORDER — NOREPINEPHRINE 4 MG/250ML-% IV SOLN: 0.0000 ug/min | INTRAVENOUS | Status: DC

## 2023-03-13 MED ORDER — MAGNESIUM SULFATE 4 GM/100ML IV SOLN
4.0000 g | Freq: Once | INTRAVENOUS | Status: AC
  Administered 2023-03-13: 4 g via INTRAVENOUS
  Filled 2023-03-13: qty 100

## 2023-03-13 MED ORDER — MORPHINE SULFATE (PF) 2 MG/ML IV SOLN
1.0000 mg | INTRAVENOUS | Status: DC | PRN
  Administered 2023-03-13: 4 mg via INTRAVENOUS
  Administered 2023-03-13 (×2): 2 mg via INTRAVENOUS
  Administered 2023-03-13 – 2023-03-14 (×7): 4 mg via INTRAVENOUS
  Filled 2023-03-13 (×7): qty 2
  Filled 2023-03-13: qty 1
  Filled 2023-03-13: qty 2
  Filled 2023-03-13: qty 1

## 2023-03-13 MED ORDER — VANCOMYCIN HCL IN DEXTROSE 1-5 GM/200ML-% IV SOLN
1000.0000 mg | Freq: Once | INTRAVENOUS | Status: AC
  Administered 2023-03-13: 1000 mg via INTRAVENOUS
  Filled 2023-03-13: qty 200

## 2023-03-13 MED ORDER — FAMOTIDINE IN NACL 20-0.9 MG/50ML-% IV SOLN
20.0000 mg | Freq: Two times a day (BID) | INTRAVENOUS | Status: AC
  Administered 2023-03-13: 20 mg via INTRAVENOUS
  Filled 2023-03-13: qty 50

## 2023-03-13 MED ORDER — NITROGLYCERIN IN D5W 200-5 MCG/ML-% IV SOLN
0.0000 ug/min | INTRAVENOUS | Status: DC
  Administered 2023-03-13: 20 ug/min via INTRAVENOUS
  Administered 2023-03-14: 5 ug/min via INTRAVENOUS

## 2023-03-13 MED ORDER — METOPROLOL TARTRATE 12.5 MG HALF TABLET
12.5000 mg | ORAL_TABLET | Freq: Two times a day (BID) | ORAL | Status: DC
  Administered 2023-03-13 – 2023-03-19 (×12): 12.5 mg via ORAL
  Filled 2023-03-13 (×13): qty 1

## 2023-03-13 MED ORDER — TRAMADOL HCL 50 MG PO TABS
50.0000 mg | ORAL_TABLET | ORAL | Status: DC | PRN
  Administered 2023-03-14 – 2023-03-21 (×17): 50 mg via ORAL
  Filled 2023-03-13 (×18): qty 1

## 2023-03-13 MED ORDER — ROCURONIUM BROMIDE 10 MG/ML (PF) SYRINGE
PREFILLED_SYRINGE | INTRAVENOUS | Status: AC
  Filled 2023-03-13: qty 20

## 2023-03-13 MED ORDER — SODIUM CHLORIDE 0.9% FLUSH
3.0000 mL | Freq: Two times a day (BID) | INTRAVENOUS | Status: DC
  Administered 2023-03-14 – 2023-03-18 (×9): 3 mL via INTRAVENOUS

## 2023-03-13 MED ORDER — PHENYLEPHRINE HCL-NACL 20-0.9 MG/250ML-% IV SOLN
0.0000 ug/min | INTRAVENOUS | Status: DC
  Administered 2023-03-13: 40 ug/min via INTRAVENOUS

## 2023-03-13 MED ORDER — ASPIRIN 325 MG PO TBEC: 325.0000 mg | DELAYED_RELEASE_TABLET | Freq: Every day | ORAL | Status: DC

## 2023-03-13 MED ORDER — POTASSIUM CHLORIDE 10 MEQ/50ML IV SOLN: 10.0000 meq | INTRAVENOUS | Status: AC

## 2023-03-13 MED ORDER — FENTANYL CITRATE (PF) 250 MCG/5ML IJ SOLN
INTRAMUSCULAR | Status: DC | PRN
  Administered 2023-03-13: 150 ug via INTRAVENOUS
  Administered 2023-03-13: 100 ug via INTRAVENOUS
  Administered 2023-03-13 (×2): 150 ug via INTRAVENOUS
  Administered 2023-03-13 (×5): 100 ug via INTRAVENOUS
  Administered 2023-03-13: 150 ug via INTRAVENOUS
  Administered 2023-03-13: 50 ug via INTRAVENOUS

## 2023-03-13 MED ORDER — HEMOSTATIC AGENTS (NO CHARGE) OPTIME
TOPICAL | Status: DC | PRN
  Administered 2023-03-13: 1 via TOPICAL

## 2023-03-13 MED ORDER — PLASMA-LYTE A IV SOLN
INTRAVENOUS | Status: DC | PRN
  Administered 2023-03-13: 250 mL via INTRAVASCULAR

## 2023-03-13 MED ORDER — SODIUM CHLORIDE 0.9 % IV SOLN: 250.0000 mL | INTRAVENOUS | Status: DC

## 2023-03-13 MED ORDER — ASPIRIN 81 MG PO CHEW: 324.0000 mg | CHEWABLE_TABLET | Freq: Every day | ORAL | Status: DC

## 2023-03-13 MED ORDER — 0.9 % SODIUM CHLORIDE (POUR BTL) OPTIME
TOPICAL | Status: DC | PRN
  Administered 2023-03-13: 4000 mL

## 2023-03-13 MED ORDER — DEXMEDETOMIDINE HCL IN NACL 400 MCG/100ML IV SOLN
0.0000 ug/kg/h | INTRAVENOUS | Status: DC
  Administered 2023-03-13: 0.7 ug/kg/h via INTRAVENOUS

## 2023-03-13 MED ORDER — THROMBIN 5000 UNITS EX SOLR
CUTANEOUS | Status: AC
  Filled 2023-03-13: qty 5000

## 2023-03-13 MED ORDER — BISACODYL 10 MG RE SUPP: 10.0000 mg | Freq: Every day | RECTAL | Status: DC

## 2023-03-13 MED ORDER — METOPROLOL TARTRATE 5 MG/5ML IV SOLN
2.5000 mg | INTRAVENOUS | Status: DC | PRN
  Filled 2023-03-13: qty 5

## 2023-03-13 MED ORDER — MIDAZOLAM HCL 2 MG/2ML IJ SOLN: 2.0000 mg | INTRAMUSCULAR | Status: DC | PRN

## 2023-03-13 MED ORDER — OXYCODONE HCL 5 MG PO TABS
5.0000 mg | ORAL_TABLET | ORAL | Status: DC | PRN
  Administered 2023-03-13 – 2023-03-16 (×12): 5 mg via ORAL
  Filled 2023-03-13 (×12): qty 1

## 2023-03-13 SURGICAL SUPPLY — 114 items
ADH SKN CLS APL DERMABOND .7 (GAUZE/BANDAGES/DRESSINGS) ×2
BAG DECANTER FOR FLEXI CONT (MISCELLANEOUS) ×2 IMPLANT
BLADE CLIPPER SURG (BLADE) ×2 IMPLANT
BLADE STERNUM SYSTEM 6 (BLADE) ×2 IMPLANT
BLADE SURG 11 STRL SS (BLADE) IMPLANT
BNDG CMPR 5X4 KNIT ELC UNQ LF (GAUZE/BANDAGES/DRESSINGS) ×2
BNDG CMPR 5X62 HK CLSR LF (GAUZE/BANDAGES/DRESSINGS) ×2
BNDG ELASTIC 4INX 5YD STR LF (GAUZE/BANDAGES/DRESSINGS) IMPLANT
BNDG ELASTIC 4X5.8 VLCR STR LF (GAUZE/BANDAGES/DRESSINGS) ×2 IMPLANT
BNDG ELASTIC 6INX 5YD STR LF (GAUZE/BANDAGES/DRESSINGS) IMPLANT
BNDG ELASTIC 6X5.8 VLCR STR LF (GAUZE/BANDAGES/DRESSINGS) ×2 IMPLANT
BNDG GAUZE DERMACEA FLUFF 4 (GAUZE/BANDAGES/DRESSINGS) ×2 IMPLANT
BNDG GZE DERMACEA 4 6PLY (GAUZE/BANDAGES/DRESSINGS) ×2
CANISTER SUCT 3000ML PPV (MISCELLANEOUS) ×2 IMPLANT
CANNULA ARTERIAL VENT 3/8 20FR (CANNULA) IMPLANT
CANNULA MC2 2 STG 36/46 NON-V (CANNULA) IMPLANT
CANNULA VESSEL 3MM BLUNT TIP (CANNULA) IMPLANT
CATH ROBINSON RED A/P 18FR (CATHETERS) ×4 IMPLANT
CATH THORACIC 28FR (CATHETERS) ×2 IMPLANT
CATH THORACIC 36FR (CATHETERS) ×2 IMPLANT
CATH THORACIC 36FR RT ANG (CATHETERS) ×2 IMPLANT
CLIP TI MEDIUM 24 (CLIP) IMPLANT
CLIP TI WIDE RED SMALL 24 (CLIP) IMPLANT
CONTAINER PROTECT SURGISLUSH (MISCELLANEOUS) ×4 IMPLANT
DERMABOND ADVANCED .7 DNX12 (GAUZE/BANDAGES/DRESSINGS) IMPLANT
DRAPE CARDIOVASCULAR INCISE (DRAPES) ×2
DRAPE SRG 135X102X78XABS (DRAPES) ×2 IMPLANT
DRAPE WARM FLUID 44X44 (DRAPES) ×2 IMPLANT
DRSG COVADERM 4X14 (GAUZE/BANDAGES/DRESSINGS) ×2 IMPLANT
DRSG XEROFORM 1X8 (GAUZE/BANDAGES/DRESSINGS) IMPLANT
ELECT BLADE 4.0 EZ CLEAN MEGAD (MISCELLANEOUS) ×2
ELECT CAUTERY BLADE 6.4 (BLADE) ×2 IMPLANT
ELECT REM PT RETURN 9FT ADLT (ELECTROSURGICAL) ×4
ELECTRODE BLDE 4.0 EZ CLN MEGD (MISCELLANEOUS) IMPLANT
ELECTRODE REM PT RTRN 9FT ADLT (ELECTROSURGICAL) ×4 IMPLANT
FELT TEFLON 1X6 (MISCELLANEOUS) ×2 IMPLANT
GAUZE 4X4 16PLY ~~LOC~~+RFID DBL (SPONGE) IMPLANT
GAUZE SPONGE 4X4 12PLY STRL (GAUZE/BANDAGES/DRESSINGS) ×4 IMPLANT
GAUZE XEROFORM 1X8 LF (GAUZE/BANDAGES/DRESSINGS) IMPLANT
GLOVE BIO SURGEON STRL SZ 6 (GLOVE) IMPLANT
GLOVE BIO SURGEON STRL SZ 6.5 (GLOVE) IMPLANT
GLOVE BIO SURGEON STRL SZ7 (GLOVE) IMPLANT
GLOVE BIO SURGEON STRL SZ7.5 (GLOVE) IMPLANT
GLOVE BIOGEL PI IND STRL 6 (GLOVE) IMPLANT
GLOVE BIOGEL PI IND STRL 6.5 (GLOVE) IMPLANT
GLOVE BIOGEL PI IND STRL 7.0 (GLOVE) IMPLANT
GLOVE ECLIPSE 7.0 STRL STRAW (GLOVE) ×4 IMPLANT
GLOVE ORTHO TXT STRL SZ7.5 (GLOVE) IMPLANT
GOWN STRL REUS W/ TWL LRG LVL3 (GOWN DISPOSABLE) ×8 IMPLANT
GOWN STRL REUS W/ TWL XL LVL3 (GOWN DISPOSABLE) ×2 IMPLANT
GOWN STRL REUS W/TWL LRG LVL3 (GOWN DISPOSABLE) ×8
GOWN STRL REUS W/TWL XL LVL3 (GOWN DISPOSABLE) ×2
HEMOSTAT POWDER SURGIFOAM 1G (HEMOSTASIS) ×6 IMPLANT
HEMOSTAT SURGICEL 2X14 (HEMOSTASIS) ×2 IMPLANT
INSERT FOGARTY 61MM (MISCELLANEOUS) IMPLANT
INSERT FOGARTY XLG (MISCELLANEOUS) IMPLANT
KIT BASIN OR (CUSTOM PROCEDURE TRAY) ×2 IMPLANT
KIT SUCTION CATH 14FR (SUCTIONS) ×2 IMPLANT
KIT TURNOVER KIT B (KITS) ×2 IMPLANT
KIT VASOVIEW HEMOPRO 2 VH 4000 (KITS) ×2 IMPLANT
KNIFE MICRO-UNI 3.5 30 DEG (BLADE) ×2 IMPLANT
NS IRRIG 1000ML POUR BTL (IV SOLUTION) ×10 IMPLANT
PACK E OPEN HEART (SUTURE) ×2 IMPLANT
PACK OPEN HEART (CUSTOM PROCEDURE TRAY) ×2 IMPLANT
PAD ARMBOARD 7.5X6 YLW CONV (MISCELLANEOUS) ×4 IMPLANT
PAD ELECT DEFIB RADIOL ZOLL (MISCELLANEOUS) ×2 IMPLANT
PENCIL BUTTON HOLSTER BLD 10FT (ELECTRODE) ×2 IMPLANT
POSITIONER HEAD DONUT 9IN (MISCELLANEOUS) ×2 IMPLANT
PUNCH AORTIC ROTATE 4.0MM (MISCELLANEOUS) IMPLANT
PUNCH AORTIC ROTATE 4.5MM 8IN (MISCELLANEOUS) ×2 IMPLANT
PUNCH AORTIC ROTATE 5MM 8IN (MISCELLANEOUS) IMPLANT
SET MPS 3-ND DEL (MISCELLANEOUS) IMPLANT
SOL PREP POV-IOD 4OZ 10% (MISCELLANEOUS) IMPLANT
SOL SCRUB PVP POV-IOD 4OZ 7.5% (MISCELLANEOUS) ×4
SOLUTION SCRB POV-IOD 4OZ 7.5% (MISCELLANEOUS) IMPLANT
SPONGE INTESTINAL PEANUT (DISPOSABLE) IMPLANT
SPONGE T-LAP 18X18 ~~LOC~~+RFID (SPONGE) IMPLANT
SPONGE T-LAP 4X18 ~~LOC~~+RFID (SPONGE) IMPLANT
SUPPORT HEART JANKE-BARRON (MISCELLANEOUS) ×2 IMPLANT
SUT BONE WAX W31G (SUTURE) ×2 IMPLANT
SUT MNCRL AB 4-0 PS2 18 (SUTURE) IMPLANT
SUT PROLENE 3 0 SH DA (SUTURE) IMPLANT
SUT PROLENE 3 0 SH1 36 (SUTURE) ×2 IMPLANT
SUT PROLENE 4 0 RB 1 (SUTURE) ×2
SUT PROLENE 4 0 SH DA (SUTURE) IMPLANT
SUT PROLENE 4-0 RB1 .5 CRCL 36 (SUTURE) IMPLANT
SUT PROLENE 5 0 C 1 36 (SUTURE) IMPLANT
SUT PROLENE 6 0 C 1 30 (SUTURE) IMPLANT
SUT PROLENE 7 0 BV 1 (SUTURE) IMPLANT
SUT PROLENE 7 0 BV1 MDA (SUTURE) ×2 IMPLANT
SUT PROLENE 8 0 BV175 6 (SUTURE) IMPLANT
SUT SILK  1 MH (SUTURE)
SUT SILK 1 MH (SUTURE) IMPLANT
SUT SILK 2 0 SH (SUTURE) IMPLANT
SUT STEEL STERNAL CCS#1 18IN (SUTURE) IMPLANT
SUT STEEL SZ 6 DBL 3X14 BALL (SUTURE) IMPLANT
SUT VIC AB 1 CTX 36 (SUTURE) ×4
SUT VIC AB 1 CTX36XBRD ANBCTR (SUTURE) ×4 IMPLANT
SUT VIC AB 2-0 CT1 27 (SUTURE) ×2
SUT VIC AB 2-0 CT1 TAPERPNT 27 (SUTURE) IMPLANT
SUT VIC AB 2-0 CTX 27 (SUTURE) IMPLANT
SUT VIC AB 3-0 SH 27 (SUTURE)
SUT VIC AB 3-0 SH 27X BRD (SUTURE) IMPLANT
SUT VIC AB 3-0 X1 27 (SUTURE) IMPLANT
SUT VICRYL 4-0 PS2 18IN ABS (SUTURE) IMPLANT
SYSTEM SAHARA CHEST DRAIN ATS (WOUND CARE) ×2 IMPLANT
TAPE CLOTH SURG 6X10 WHT LF (GAUZE/BANDAGES/DRESSINGS) IMPLANT
TAPE PAPER 2X10 WHT MICROPORE (GAUZE/BANDAGES/DRESSINGS) IMPLANT
TOWEL GREEN STERILE (TOWEL DISPOSABLE) ×2 IMPLANT
TOWEL GREEN STERILE FF (TOWEL DISPOSABLE) ×2 IMPLANT
TRAY FOLEY SLVR 16FR TEMP STAT (SET/KITS/TRAYS/PACK) ×2 IMPLANT
TUBING LAP HI FLOW INSUFFLATIO (TUBING) ×2 IMPLANT
UNDERPAD 30X36 HEAVY ABSORB (UNDERPADS AND DIAPERS) ×2 IMPLANT
WATER STERILE IRR 1000ML POUR (IV SOLUTION) ×4 IMPLANT

## 2023-03-13 NOTE — Discharge Instructions (Signed)

## 2023-03-13 NOTE — Anesthesia Postprocedure Evaluation (Signed)
Anesthesia Post Note  Patient: Bruce Smith  Procedure(s) Performed: CORONARY ARTERY BYPASS GRAFTING (CABG) X3, USING RIGHT LEG GREATER SAPHENOUS VEIN HARVESTED ENDOSCOPICALLY & LEFT IMA (Chest) TRANSESOPHAGEAL ECHOCARDIOGRAM     Patient location during evaluation: SICU Anesthesia Type: General Level of consciousness: sedated Pain management: pain level controlled Vital Signs Assessment: post-procedure vital signs reviewed and stable Respiratory status: patient remains intubated per anesthesia plan Cardiovascular status: stable Postop Assessment: no apparent nausea or vomiting Anesthetic complications: no   No notable events documented.  Last Vitals:  Vitals:   03/13/23 1345 03/13/23 1400  BP:  107/74  Pulse: 80 88  Resp: 16 16  Temp: (!) 35.7 C (!) 35.7 C  SpO2: 100% 100%    Last Pain:  Vitals:   03/13/23 1300  TempSrc: Core  PainSc:                  Nelle Don Chondra Boyde

## 2023-03-13 NOTE — Interval H&P Note (Signed)
History and Physical Interval Note:  03/13/2023 6:51 AM  Bruce Smith  has presented today for surgery, with the diagnosis of CAD.  The various methods of treatment have been discussed with the patient and family. After consideration of risks, benefits and other options for treatment, the patient has consented to  Procedure(s): CORONARY ARTERY BYPASS GRAFTING (CABG) (N/A) TRANSESOPHAGEAL ECHOCARDIOGRAM (N/A) as a surgical intervention.  The patient's history has been reviewed, patient examined, no change in status, stable for surgery.  I have reviewed the patient's chart and labs.  Questions were answered to the patient's satisfaction.     Bruce Smith

## 2023-03-13 NOTE — Transfer of Care (Signed)
Immediate Anesthesia Transfer of Care Note  Patient: Bruce Smith  Procedure(s) Performed: CORONARY ARTERY BYPASS GRAFTING (CABG) X3, USING RIGHT LEG GREATER SAPHENOUS VEIN HARVESTED ENDOSCOPICALLY & LEFT IMA (Chest) TRANSESOPHAGEAL ECHOCARDIOGRAM  Patient Location: ICU  Anesthesia Type:General  Level of Consciousness: sedated and Patient remains intubated per anesthesia plan  Airway & Oxygen Therapy: Patient remains intubated per anesthesia plan and Patient placed on Ventilator (see vital sign flow sheet for setting)  Post-op Assessment: Report given to RN and Post -op Vital signs reviewed and stable  Post vital signs: Reviewed and stable  Last Vitals:  Vitals Value Taken Time  BP 119/59   Temp    Pulse 80 03/13/23 1234  Resp 10 03/13/23 1234  SpO2 99 % 03/13/23 1234  Vitals shown include unvalidated device data.  Last Pain:  Vitals:   03/13/23 0422  TempSrc: Oral  PainSc:       Patients Stated Pain Goal: 0 (03/09/23 1935)  Complications: No notable events documented.

## 2023-03-13 NOTE — Discharge Summary (Signed)
[address]       [place] [address]             [phone]    Physician Discharge Summary  Patient ID: Bruce Smith MRN: [id number] DOB/AGE: [birth year] 72 y.o.  Admit date: 03/08/2023 Discharge date: 03/22/2023  Admission Diagnoses:  Patient Active Problem List   Diagnosis Date Noted   Coronary artery disease 03/13/2023   NSTEMI (non-ST elevated myocardial infarction) (HCC) 03/09/2023   Hypomagnesemia 03/23/2021   Hypocalcemia 03/23/2021   Physical debility 03/10/2021   Debility    Enterococcus UTI    Pain in both feet    Malnutrition of moderate degree 03/05/2021   FTT (failure to thrive) in adult 02/23/2021   AKI (acute kidney injury) (HCC) 10/29/2020   Abnormal neurological exam 10/29/2020   Essential hypertension 10/29/2020   CAD (coronary artery disease) 10/29/2020   Intractable pain 10/26/2020   Kappa light chain myeloma (HCC) 07/21/2020   Hypercalcemia of malignancy 06/30/2020   Goals of care, counseling/discussion 06/29/2020   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   Cervical spondylosis with myelopathy and radiculopathy 06/17/2020     Discharge Diagnoses:  Patient Active Problem List   Diagnosis Date Noted   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   Coronary artery disease 03/13/2023   NSTEMI (non-ST elevated myocardial infarction) (HCC) 03/09/2023   Hypomagnesemia 03/23/2021   Hypocalcemia 03/23/2021   Physical debility 03/10/2021   Debility    Enterococcus UTI    Pain in both feet    Malnutrition of moderate degree 03/05/2021   FTT (failure to thrive) in adult 02/23/2021   AKI (acute kidney injury) (HCC) 10/29/2020   Abnormal neurological exam 10/29/2020   Essential hypertension 10/29/2020   CAD (coronary artery disease) 10/29/2020   Intractable pain 10/26/2020   Kappa light chain myeloma (HCC) 07/21/2020   Hypercalcemia of malignancy 06/30/2020   Goals of care, counseling/discussion 06/29/2020   Malignant tumor  of bone and articular cartilage (HCC) 06/29/2020   Cervical spondylosis with myelopathy and radiculopathy 06/17/2020     Discharged Condition: stable  HPI: Bruce Smith is a 72 year old gentleman with a hx of HTN, HLD, multiple myeloma with prior stem cell transplant and recurrence treated with chemotherapy felt to be in remission, and CAD s/p multiple prior PCI's in the LAD, OM, and PDA. He reports being in his usual state of health until the day of admission when he developed pain his his chest with radiation to the neck, jaw and arms. Troponin was 1279, increasing to 7465. ECG showed non-specific changes. His chest pain resolved and has not recurred. Cath shows severe 3 vessel CAD with an EF of 50-55% and mildly elevated LVEDP of 17 mm Hg. Echo showed an EF of 55-60% with no valvular abnormality.   His wife is with him today. He reports that he has not had any chest, neck, jaw or arm pain prior to this episode. He had some jaw pain in the past before his previous stents. He denies shortness of breath. He had some diaphoresis during this current episode of pain. He stays active but can not walk that far due to peripheral neuropathy. He uses a cane for stability.  Dr. Laneta Simmers discussed the need for coronary artery bypass grafting surgery. Potential risks, benefits, and complications of the surgery were discussed with the patient and he agreed to proceed with surgery. Pre operative carotid US showed no significant internal carotid artery stenosis bilaterally. Mr.  Smith was agreeable to proceed with surgery.    Hospital Course: Bruce Smith was admitted to [facility] on 05/09. He experienced some jaw pain but remained stable until he was brought to the operating room on 03/13/23. He underwent a CABG x 3 utilizing LIMA to LAD, SVG to OM1 and SVG to OM2. He tolerated the procedure well and was transferred from the OR to [facility] ICU in stable condition. He was extubated the evening of surgery. Drips were  weaned as hemodynamics tolerated. Theone Murdoch catheter, arterial line, and chest tubes were removed on POD1 without complication. He was transitioned off the Insulin drip. His pre op HGA1C is 5.5. Accu checks and SS PRN will be stopped upon transfer. He was volume overload and was diuresed accordingly. Foley and sleeve were removed on 05/15. He had a fever to 100.6; may be related to sleeve. He was using his incentive spirometer and there was not a lot of atelectasis on CXR. He was felt surgically stable for transfer from the ICU to 4E for further convalescence on 05/15. He continued to maintain sinus rhythm. As discussed with Dr. Laneta Simmers, Plavix for ACS was not started due to history of multiple myeloma and thrombocytopenia. His epicardial pacing wires were removed on 05/16. He was tolerating a diet and had a bowel movement. All his wounds were clean, dry, and healing without signs of infection. Early 05/17, the patient developed urinary retention and was in and out catheterized. He was already on Flomax. He then developed a fever to 102.7 with confusion. Labs and PA/LAT CXR were obtained. CXR showed no sign of pneumonia. His wounds were free of sign of infection and he only had a saline line in place. UA, urine culture and blood cultures were ordered. CBC showed leukocytosis, pharmacy was consulted for antibiotics and empiric Unasyn was started for a likely complicated UTI. He continued with urinary retention, foley catheter was placed. He only developed minor leukocytosis and fevers improved over time. He developed diarrhea and stool was also sent for culture. Stool softeners were discontinued. The patient admitted to a history of diarrhea with Magnesium, Magnesium was discontinued. Abdominal x-ray on 03/19/2023 was benign. He was treated with Imodium, diarrhea resolved with time. He developed fairly significant weakness but this showed gradual improvement. A PT consult was obtained to assist with management, and  they recommended home health at discharge which was arranged. Foley catheter was removed on 05/19 and he began voiding independently without difficulty. UA showed hematuria and rare bacteria, urine culture showed no growth and blood cultures showed no growth.  His fevers and leukocytosis resolved. Pharmacy was consulted for oral antibiotic coverage, IV Unasyn was discontinued and PO Augmentin was started for a total of 7 days antibiotic coverage per pharmacy recommendation. Stool culture was negative for E Coli and Shiga toxin but otherwise was still pending. He was ambulating well and saturating well on room air. His incisions were healing well without sign of infection. He was felt stable for discharge home.   Consults: None  Significant Diagnostic Studies:  LEFT HEART CATH AND CORONARY ANGIOGRAPHY   Conclusion   Ost LM to Mid LM lesion is 60% stenosed.   Mid LAD lesion is 90% stenosed.   Ost Cx lesion is 60% stenosed.   1st Mrg lesion is 90% stenosed.   Mid Cx to Dist Cx lesion is 90% stenosed.   3rd Mrg lesion is 80% stenosed.   Previously placed Prox RCA to Mid RCA stent of unknown  type is  widely patent.   The left ventricular systolic function is normal.   LV end diastolic pressure is mildly elevated.   The left ventricular ejection fraction is 50-55% by visual estimate.   ECHOCARDIOGRAM REPORT   Patient Name:   Bruce Smith Date of Exam: 03/09/2023 Medical Rec #:  [id number]    Height:       68.0 in Accession #:    [id number]   Weight:       199.7 lb Date of Birth:  04/28/1951     BSA:          2.043 m Patient Age:    71 years     BP:           142/61 mmHg Patient Gender: M            HR:           57 bpm. Exam Location:  Inpatient  Procedure: 2D Echo, Cardiac Doppler, Color Doppler and Intracardiac            Opacification Agent  Indications:    122-I22.9 Subsequent ST elevation (STEM) and non-ST elevation                 (NSTEMI) myocardial infarction   History:         Patient has prior history of Echocardiogram examinations, most                 recent 10/30/2020. Acute MI and CAD; Risk Factors:Former Smoker                 and Dyslipidemia. Metastatic disease.   Sonographer:    Sheralyn Boatman RDCS Referring Phys: Maurine Minister NARCISSE JR  IMPRESSIONS  1. Left ventricular ejection fraction, by estimation, is 55 to 60%. The left ventricle has normal function. The left ventricle demonstrates regional wall motion abnormalities (see scoring diagram/findings for description). There is mild left ventricular  hypertrophy. Left ventricular diastolic parameters were grossly normal.  2. Right ventricular systolic function is normal. The right ventricular size is normal. There is normal pulmonary artery systolic pressure. The estimated right ventricular systolic pressure is 20.1 mmHg.  3. The mitral valve is grossly normal. Trivial mitral valve regurgitation. No evidence of mitral stenosis.  4. The aortic valve is tricuspid. There is mild calcification of the aortic valve. Aortic valve regurgitation is not visualized. No aortic stenosis is present.  5. The inferior vena cava is normal in size with greater than 50% respiratory variability, suggesting right atrial pressure of 3 mmHg.  FINDINGS  Left Ventricle: Left ventricular ejection fraction, by estimation, is 55 to 60%. The left ventricle has normal function. The left ventricle demonstrates regional wall motion abnormalities. Definity contrast agent was given IV to delineate the left ventricular endocardial borders. The left ventricular internal cavity size was normal in size. There is mild left ventricular hypertrophy. Left ventricular diastolic parameters were normal.    LV Wall Scoring: The apical lateral segment, apical septal segment, and apical anterior segment are hypokinetic.  Right Ventricle: The right ventricular size is normal. No increase in right ventricular wall thickness. Right ventricular  systolic function is normal. There is normal pulmonary artery systolic pressure. The tricuspid regurgitant velocity is 2.07 m/s, and  with an assumed right atrial pressure of 3 mmHg, the estimated right ventricular systolic pressure is 20.1 mmHg.  Left Atrium: Left atrial size was normal in size.  Right Atrium: Right atrial size was normal in size.  Pericardium: Trivial  pericardial effusion is present.  Mitral Valve: The mitral valve is grossly normal. Trivial mitral valve regurgitation. No evidence of mitral valve stenosis.  Tricuspid Valve: The tricuspid valve is normal in structure. Tricuspid valve regurgitation is mild . No evidence of tricuspid stenosis.  Aortic Valve: The aortic valve is tricuspid. There is mild calcification of the aortic valve. Aortic valve regurgitation is not visualized. No aortic stenosis is present.  Pulmonic Valve: The pulmonic valve was normal in structure. Pulmonic valve regurgitation is trivial. No evidence of pulmonic stenosis.  Aorta: The aortic root is normal in size and structure.  Venous: The inferior vena cava is normal in size with greater than 50% respiratory variability, suggesting right atrial pressure of 3 mmHg.  IAS/Shunts: No atrial level shunt detected by color flow Doppler.    LEFT VENTRICLE PLAX 2D LVIDd:         5.05 cm      Diastology LVIDs:         3.00 cm      LV e' medial:    6.85 cm/s LV PW:         0.95 cm      LV E/e' medial:  11.8 LV IVS:        1.10 cm      LV e' lateral:   8.75 cm/s LVOT diam:     2.30 cm      LV E/e' lateral: 9.2 LV SV:         99 LV SV Index:   48 LVOT Area:     4.15 cm   LV Volumes (MOD) LV vol d, MOD A2C: 150.0 ml LV vol d, MOD A4C: 151.0 ml LV vol s, MOD A2C: 70.5 ml LV vol s, MOD A4C: 57.8 ml LV SV MOD A2C:     79.5 ml LV SV MOD A4C:     151.0 ml LV SV MOD BP:      89.5 ml  RIGHT VENTRICLE             IVC RV S prime:     11.30 cm/s  IVC diam: 2.10 cm TAPSE (M-mode): 2.0  cm  LEFT ATRIUM             Index        RIGHT ATRIUM          Index LA diam:        2.80 cm 1.37 cm/m   RA Area:     8.65 cm LA Vol (A2C):   30.0 ml 14.69 ml/m  RA Volume:   14.20 ml 6.95 ml/m LA Vol (A4C):   24.6 ml 12.04 ml/m LA Biplane Vol: 29.8 ml 14.59 ml/m  AORTIC VALVE             PULMONIC VALVE LVOT Vmax:   103.00 cm/s PR End Diast Vel: 1.47 msec LVOT Vmean:  63.400 cm/s LVOT VTI:    0.238 m   AORTA Ao Root diam: 3.50 cm Ao Asc diam:  3.20 cm  MITRAL VALVE               TRICUSPID VALVE MV Area (PHT): 2.45 cm    TR Peak grad:   17.1 mmHg MV Decel Time: 310 msec    TR Vmax:        207.00 cm/s MV E velocity: 80.70 cm/s MV A velocity: 73.40 cm/s  SHUNTS MV E/A ratio:  1.10        Systemic VTI:  0.24 m  Systemic Diam: 2.30 cm  Weston Brass MD Electronically signed by Weston Brass MD Signature Date/Time: 03/09/2023/2:52:27 PM    Final     Treatments: surgery:  Surgeon:  Alleen Borne, MD  Procedure: Median Sternotomy Extracorporeal circulation 3.   Coronary artery bypass grafting x 3 Left internal mammary artery graft to the LAD SVG to OM1 SVG to OM2  4.   Endoscopic vein harvest from the right leg  Discharge Exam: Blood pressure (!) 122/58, pulse 89, temperature 98.5 F (36.9 C), temperature source Oral, resp. rate 18, height 5\' 8"  (1.727 m), weight 89.1 kg, SpO2 98 %. General appearance: alert, cooperative, and no distress Neurologic: intact Heart: regular rate and rhythm, S1, S2 normal, no murmur, click, rub or gallop Lungs: clear to auscultation bilaterally Abdomen: soft, non-tender; bowel sounds normal; no masses,  no organomegaly Extremities: extremities normal, atraumatic, no cyanosis or edema Wound: Clean and dry, no erythema or sign of infection  Discharge Medications:  The patient has been discharged on:   1.Beta Blocker:  Yes [ X  ]                              No   [   ]                              If  No, reason:  2.Ace Inhibitor/ARB: Yes [   ]                                     No  [  X  ]                                     If No, reason: Hypotension  3.Statin:   Yes [ x  ]                  No  [   ]                  If No, reason:  4.Ecasa:  Yes  [ x  ]                  No   [   ]                  If No, reason:  Patient had ACS upon admission: YES  Plavix/P2Y12 inhibitor: Yes [   ]                                      No  [  x ]. History of multiple myeloma and thrombocytopenia      Discharge Instructions     Amb Referral to Cardiac Rehabilitation   Complete by: As directed    Diagnosis:  CABG NSTEMI     CABG X ___: 3   After initial evaluation and assessments completed: Virtual Based Care may be provided alone or in conjunction with Phase 2 Cardiac Rehab based on patient barriers.: Yes   Intensive Cardiac Rehabilitation (ICR) [facility] location only OR Traditional Cardiac Rehabilitation (TCR) *If criteria for ICR are not met will enroll  in TCR [facility] only): Yes      Allergies as of 03/22/2023   No Known Allergies      Medication List     STOP taking these medications    acyclovir 400 MG tablet Commonly known as: ZOVIRAX   celecoxib 100 MG capsule Commonly known as: CELEBREX   dexamethasone 4 MG tablet Commonly known as: DECADRON   metoprolol succinate 25 MG 24 hr tablet Commonly known as: TOPROL-XL   nitroGLYCERIN 0.4 MG SL tablet Commonly known as: NITROSTAT       TAKE these medications    ACETAMINOPHEN ER PO Take 1,000 mg by mouth in the morning, at noon, in the evening, and at bedtime.   amoxicillin-clavulanate 875-125 MG tablet Commonly known as: AUGMENTIN Take 1 tablet by mouth every 12 (twelve) hours.   aspirin EC 81 MG tablet Take 81 mg by mouth at bedtime.   calcium carbonate 1250 (500 Ca) MG tablet Commonly known as: OS-CAL - dosed in mg of elemental calcium Take 1 tablet (500 mg of elemental calcium total) by mouth 2 (two) times  daily with a meal.   Fe Fum-Vit C-Vit B12-FA Caps capsule Commonly known as: TRIGELS-F FORTE Take 1 capsule by mouth daily after breakfast.   levETIRAcetam 500 MG tablet Commonly known as: KEPPRA TAKE 1 TABLET BY MOUTH 2 TIMES A DAY   loperamide 2 MG capsule Commonly known as: IMODIUM Take 1 capsule (2 mg total) by mouth as needed for diarrhea or loose stools.   MAGNESIUM PO Take 100 mg by mouth daily.   metoprolol tartrate 25 MG tablet Commonly known as: LOPRESSOR Take 1 tablet (25 mg total) by mouth 2 (two) times daily.   ondansetron 8 MG tablet Commonly known as: ZOFRAN TAKE 1 TABLET BY MOUTH EVERY 8 HOURS AS NEEDED NAUSEA AND VOMITING What changed:  how much to take how to take this when to take this reasons to take this additional instructions   pantoprazole 40 MG tablet Commonly known as: PROTONIX TAKE 1 TABLET BY MOUTH 2 TIMES A DAY BEFORE MEALS FOR ACID REFLUX What changed: See the new instructions.   polyethylene glycol powder 17 GM/SCOOP powder Commonly known as: GLYCOLAX/MIRALAX Mix 17g (1 capful) with 8 oz of favorite beverage and drink every morning   pregabalin 200 MG capsule Commonly known as: LYRICA TAKE 1 CAPSULE BY MOUTH 2 TIMES DAILY   prochlorperazine 10 MG tablet Commonly known as: COMPAZINE Take 1 tablet (10 mg total) by mouth every 6 (six) hours as needed for nausea or vomiting.   rosuvastatin 40 MG tablet Commonly known as: CRESTOR Take 1 tablet (40 mg total) by mouth daily.   saccharomyces boulardii 250 MG capsule Commonly known as: FLORASTOR Take 250 mg by mouth daily.   tamsulosin 0.4 MG Caps capsule Commonly known as: FLOMAX Take 1 capsule (0.4 mg total) by mouth at bedtime. What changed: when to take this   traMADol 50 MG tablet Commonly known as: ULTRAM TAKE TWO TABLETS BY MOUTH EVERY 6 HOURS AS NEEDED FOR PAIN What changed: See the new instructions.        Follow-up Information     Alleen Borne, MD. Go on  04/12/2023.   Specialty: Cardiothoracic Surgery Why: Appointment time is at 12:30 pm Contact information: [address] [employer] [address] [place] [address] [phone]         [facility]. Go on 04/12/2023.   Why: Please arrive by 11:00 am as need PA/LAT CXR taken prior to  office appointment with Dr. Cristy Hilts information: [address] [place] [address]        Sharlene Dory, [place]. Go on 04/03/2023.   Specialty: Cardiology Why: Appointment time is at 10:55 am Contact information: [address] [place] [address] [phone]         [facility] Follow up.   Why: Iantha Fallen)- HHPT arranged under [facility] office referral for [facility] protocol- they will contact you to schedule Contact information: [address] [place] [address] [phone]                 Signed:  Jenny Reichmann, PA-C 03/22/2023, 12:20 PM

## 2023-03-13 NOTE — Anesthesia Procedure Notes (Signed)
Central Venous Catheter Insertion Performed by: Atilano Median, DO, anesthesiologist Start/End5/13/2024 6:58 AM, 03/13/2023 7:00 AM Patient location: Pre-op. Preanesthetic checklist: patient identified, IV checked, site marked, risks and benefits discussed, surgical consent, monitors and equipment checked, pre-op evaluation, timeout performed and anesthesia consent Position: supine Hand hygiene performed  and maximum sterile barriers used  PA cath was placed.Swan type:thermodilution PA Cath depth:50 Procedure performed without using ultrasound guided technique. Attempts: 1 Patient tolerated the procedure well with no immediate complications.

## 2023-03-13 NOTE — Progress Notes (Signed)
Pt achieved a NIF of -40 and VC of 1.52 with great pt effort on all attempts. RN at bedside, RT will monitor as needed.

## 2023-03-13 NOTE — Anesthesia Procedure Notes (Signed)
Procedure Name: Intubation Date/Time: 03/13/2023 7:51 AM  Performed by: Nils Pyle, CRNAPre-anesthesia Checklist: Patient identified, Emergency Drugs available, Suction available and Patient being monitored Patient Re-evaluated:Patient Re-evaluated prior to induction Oxygen Delivery Method: Circle System Utilized Preoxygenation: Pre-oxygenation with 100% oxygen Induction Type: IV induction Ventilation: Mask ventilation without difficulty and Oral airway inserted - appropriate to patient size Laryngoscope Size: Mac, 4 and Glidescope Grade View: Grade I Tube type: Oral Tube size: 8.0 mm Number of attempts: 1 Airway Equipment and Method: Stylet and Oral airway Placement Confirmation: ETT inserted through vocal cords under direct vision, positive ETCO2 and breath sounds checked- equal and bilateral Secured at: 23 cm Tube secured with: Tape Dental Injury: Teeth and Oropharynx as per pre-operative assessment  Comments: DL x1 with Mac 4 by SRNA. Unable to obtain good view. DL x1 with glidescope 4 to obtain grade 1 view.

## 2023-03-13 NOTE — Anesthesia Procedure Notes (Signed)
Central Venous Catheter Insertion Performed by: Atilano Median, DO, anesthesiologist Start/End5/13/2024 6:47 AM, 03/13/2023 6:57 AM Patient location: Pre-op. Preanesthetic checklist: patient identified, IV checked, site marked, risks and benefits discussed, surgical consent, monitors and equipment checked, pre-op evaluation, timeout performed and anesthesia consent Position: Trendelenburg Lidocaine 1% used for infiltration and patient sedated Hand hygiene performed  and maximum sterile barriers used  Catheter size: 8.5 Fr Central line was placed.Sheath introducer Procedure performed using ultrasound guided technique. Ultrasound Notes:anatomy identified, needle tip was noted to be adjacent to the nerve/plexus identified, no ultrasound evidence of intravascular and/or intraneural injection and image(s) printed for medical record Attempts: 1 Following insertion, line sutured, dressing applied and Biopatch. Post procedure assessment: blood return through all ports, free fluid flow and no air  Patient tolerated the procedure well with no immediate complications.

## 2023-03-13 NOTE — Op Note (Signed)
CARDIOVASCULAR SURGERY OPERATIVE NOTE  03/13/2023  Surgeon:  Alleen Borne, MD  First Assistant: Doree Fudge,  PA-C:   An experienced assistant was required given the complexity of this surgery and the standard of surgical care. The assistant was needed for endoscopic vein harvest, exposure, dissection, suctioning, retraction of delicate tissues and sutures, instrument exchange and for overall help during this procedure.   Preoperative Diagnosis:  Severe multi-vessel coronary artery disease   Postoperative Diagnosis:  Same   Procedure:  Median Sternotomy Extracorporeal circulation 3.   Coronary artery bypass grafting x 3  Left internal mammary artery graft to the LAD SVG to OM1 SVG to OM2  4.   Endoscopic vein harvest from the right leg   Anesthesia:  General Endotracheal   Clinical History/Surgical Indication:  This 72 year old gentleman has severe three-vessel coronary disease presenting with non-ST segment elevation MI.  He has high-grade stenoses in the LAD and left circumflex territories with prior stenting and his coronary disease is best treated with bypass surgery.  He is still in reasonable condition to undergo that surgery successfully. I discussed the operative procedure with the patient and family including alternatives, benefits and risks; including but not limited to bleeding, blood transfusion, infection, stroke, myocardial infarction, graft failure, heart block requiring a permanent pacemaker, organ dysfunction, and death.  Bruce Smith understands and agrees to proceed.   Preparation:  The patient was seen in the preoperative holding area and the correct patient, correct operation were confirmed with the patient after reviewing the medical record and catheterization. The consent was signed by me. Preoperative antibiotics were given. A pulmonary arterial line and  radial arterial line were placed by the anesthesia team. The patient was taken back to the operating room and positioned supine on the operating room table. After being placed under general endotracheal anesthesia by the anesthesia team a foley catheter was placed. The neck, chest, abdomen, and both legs were prepped with betadine soap and solution and draped in the usual sterile manner. A surgical time-out was taken and the correct patient and operative procedure were confirmed with the nursing and anesthesia staff.   Cardiopulmonary Bypass:  A median sternotomy was performed. The pericardium was opened in the midline. Right ventricular function appeared normal. The ascending aorta was of normal size and had no palpable plaque. There were no contraindications to aortic cannulation or cross-clamping. The patient was fully systemically heparinized and the ACT was maintained > 400 sec. The proximal aortic arch was cannulated with a 20 F aortic cannula for arterial inflow. Venous cannulation was performed via the right atrial appendage using a two-staged venous cannula. An antegrade cardioplegia/vent cannula was inserted into the mid-ascending aorta. Aortic occlusion was performed with a single cross-clamp. Systemic cooling to 32 degrees Centigrade and topical cooling of the heart with iced saline were used. Hyperkalemic antegrade cold blood cardioplegia was used to induce diastolic arrest and was then given at about 20 minute intervals throughout the period of arrest to maintain myocardial temperature at or below 10 degrees centigrade. A temperature probe was inserted into the interventricular septum and an insulating pad was placed in the pericardium.   Left internal mammary artery harvest:  The left side of the sternum was retracted using the Rultract retractor. The left internal mammary artery was harvested as a pedicle graft. All side branches were clipped. It was a medium-sized vessel of good quality  with excellent blood flow. It was ligated distally and divided. It was sprayed with topical papaverine solution  to prevent vasospasm.   Endoscopic vein harvest:  The right greater saphenous vein was harvested endoscopically through a 2 cm incision medial to the right knee. It was harvested from the upper thigh to below the knee. It was a medium-sized vein of good quality. The side branches were all ligated with 4-0 silk ties.    Coronary arteries:  The coronary arteries were examined.  LAD:  stents present throughout proximal to mid vessel. Just beyond the stents the mid LAD had a couple cm where the vessel was soft with minimal disease. Then the distal vessel was diffusely diseased and small LCX:  Ramus was small and diffusely diseased, not graftable. The OM1 and OM2 were medium caliber vessels with mild distal disease. RCA:  no stenosis on cath.   Grafts:  LIMA to the LAD: 2.0 mm. It was sewn end to side using 8-0 prolene continuous suture. SVG to OM1:  1.6 mm. It was sewn end to side using 7-0 prolene continuous suture. SVG to OM2:  1.75 mm. It was sewn end to side using 7-0 prolene continuous suture.  The proximal vein graft anastomoses were performed to the mid-ascending aorta using continuous 6-0 prolene suture. Graft markers were placed around the proximal anastomoses.   Completion:  The patient was rewarmed to 37 degrees Centigrade. The clamp was removed from the LIMA pedicle and there was rapid warming of the septum and return of ventricular fibrillation. The crossclamp was removed with a time of 60 minutes. There was spontaneous return of sinus rhythm. The distal and proximal anastomoses were checked for hemostasis. The position of the grafts was satisfactory. Two temporary epicardial pacing wires were placed on the right atrium and two on the right ventricle. The patient was weaned from CPB without difficulty on no inotropes. CPB time was 75 minutes. Cardiac output was 5 LPM.  TEE showed normal LV systolic function. Heparin was fully reversed with protamine and the aortic and venous cannulas removed. Hemostasis was achieved. Mediastinal and left pleural drainage tubes were placed. The sternum was closed with double #6 stainless steel wires. The fascia was closed with continuous # 1 vicryl suture. The subcutaneous tissue was closed with 2-0 vicryl continuous suture. The skin was closed with 3-0 vicryl subcuticular suture. All sponge, needle, and instrument counts were reported correct at the end of the case. Dry sterile dressings were placed over the incisions and around the chest tubes which were connected to pleurevac suction. The patient was then transported to the surgical intensive care unit in stable condition.

## 2023-03-13 NOTE — Progress Notes (Signed)
      [address]       [place] [address]             [phone]    S/p CABG x 3  Extubated, comfortable  BP (!) 90/49   Pulse 95   Temp 98.1 F (36.7 C) (Oral)   Resp 18   Ht 5\' 8"  (1.727 m)   Wt 90.2 kg   SpO2 94%   BMI 30.23 kg/m    NTG @ 20   Intake/Output Summary (Last 24 hours) at 03/13/2023 1751 Last data filed at 03/13/2023 1700 Gross per 24 hour  Intake 4595.9 ml  Output 2900 ml  Net 1695.9 ml  260 ml from CT  Hct 31, PLT 80 K K= 4.1  Doing well early postop  Zach Tietje C. Dorris Fetch, MD [facility] [phone]

## 2023-03-13 NOTE — Anesthesia Procedure Notes (Signed)
Arterial Line Insertion Start/End5/13/2024 6:45 AM, 03/13/2023 7:00 AM Performed by: Lindaann Pascal, CRNA, CRNA  Patient location: Pre-op. Preanesthetic checklist: patient identified, IV checked, site marked, risks and benefits discussed, surgical consent, monitors and equipment checked, pre-op evaluation, timeout performed and anesthesia consent Lidocaine 1% used for infiltration and patient sedated Left, radial was placed Catheter size: 20 G Hand hygiene performed , maximum sterile barriers used  and Seldinger technique used Allen's test indicative of satisfactory collateral circulation Attempts: 1 Procedure performed without using ultrasound guided technique. Following insertion, Biopatch and dressing applied. Post procedure assessment: normal  Patient tolerated the procedure well with no immediate complications. Additional procedure comments: Inserted by Merry Proud under CRNA supervision .

## 2023-03-13 NOTE — Brief Op Note (Signed)
03/08/2023 - 03/13/2023  10:50 AM  PATIENT:  Bruce Smith  73 y.o. male  PRE-OPERATIVE DIAGNOSIS:  1. S/p NSTEMI 2.CORONARY ARTERY DISEASE  POST-OPERATIVE DIAGNOSIS:  1. S/p NSTEMI 2.CORONARY ARTERY DISEASE  PROCEDURE:  TRANSESOPHAGEAL ECHOCARDIOGRAM, CORONARY ARTERY BYPASS GRAFTING (CABG) TIMES 3 (LIMA to LAD, SVG to OM1, and SVG to OM2) USING RIGHT LEG GREATER SAPHENOUS VEIN HARVESTED ENDOSCOPICALLY  Vein harvest time: 20 min Vein prep time: 14 min  SURGEON:  Surgeon(s) and Role:    Alleen Borne, MD - Primary  PHYSICIAN ASSISTANT: Doree Fudge PA-C  ASSISTANTS: Virgilio Frees RNFA   ANESTHESIA:   general  EBL:  Per anesthesia and perfusion record  DRAINS:  Chest tubes placed in the mediastinal and pleural spaces    COUNTS CORRECT:  YES  DICTATION: .Dragon Dictation  PLAN OF CARE: Admit to inpatient   PATIENT DISPOSITION:  ICU - intubated and hemodynamically stable.   Delay start of Pharmacological VTE agent (>24hrs) due to surgical blood loss or risk of bleeding: yes  BASELINE WEIGHT: 90.2 kg

## 2023-03-13 NOTE — Procedures (Addendum)
Extubation Procedure Note  Patient Details:   Name: Bruce Smith DOB: 1951/03/20 MRN: [id number]   Airway Documentation:    Vent end date: 03/13/23 Vent end time: 1557   Evaluation  O2 sats: stable throughout Complications: No apparent complications Patient did tolerate procedure well. Bilateral Breath Sounds: Clear, Diminished   Yes  Pt extubated to 2L [place], pt tolerating well at this time.Cuff leak positive, no stridor noted, RN at bedside, RT will monitor as needed,   Thornell Mule 03/13/2023, 4:05 PM

## 2023-03-13 NOTE — Hospital Course (Addendum)
HPI: This is a 72 year old gentleman with a hx of HTN, HLD, multiple myeloma with prior stem cell transplant and recurrence treated with chemotherapy felt to be in remission, and CAD s/p multiple prior PCI's in the LAD, OM, and PDA. He reports being in his usual state of health until the day of admission when he developed pain his his chest with radiation to the neck, jaw and arms. Troponin was 1279, increasing to 7465. ECG showed non-specific changes. His chest pain resolved and has not recurred. Cath shows severe 3 vessel CAD with an EF of 50-55% and mildly elevated LVEDP of 17 mm Hg. Echo showed an EF of 55-60% with no valvular abnormality.   His wife is with him today. He reports that he has not had any chest, neck, jaw or arm pain prior to this episode. He had some jaw pain in the past before his previous stents. He denies shortness of breath. He had some diaphoresis during this current episode of pain. He stays active but can not walk that far due to peripheral neuropathy. He uses a cane for stability.  Dr. Laneta Simmers discussed the need for coronary artery bypass grafting surgery. Potential risks, benefits, and complications of the surgery were discussed with the patient and he agreed to proceed with surgery. Pre operative carotid US showed no significant internal carotid artery stenosis bilaterally. Mr. Froman was agreeable to proceed with surgery.    Hospital Course: Mr. Todhunter was admitted to [facility] on 05/09. He experienced some jaw pain but remained stable until he was brought to the operating room on 03/13/23. He underwent a CABG x 3 utilizing LIMA to LAD, SVG to OM1 and SVG to OM2. He tolerated the procedure well and was transferred from the OR to [facility] ICU in stable condition. He was extubated the evening of surgery. Drips were weaned as hemodynamics tolerated. Theone Murdoch catheter, arterial line, and chest tubes were removed on POD1 without complication. He was transitioned off the Insulin  drip. His pre op HGA1C is 5.5. Accu checks and SS PRN will be stopped upon transfer. He was volume overload and was diuresed accordingly. Foley and sleeve were removed on 05/15. He had a fever to 100.6;may be related to sleeve. He is using his incentive spirometer and there is not a lot of atelectasis on CXR. He was felt surgically stable for transfer from the ICU to 4E for further convalescence on 05/15. He continued to maintain sinus rhythm. As discussed with Dr. Laneta Simmers, will not start Plavix  (thrombocytopenia) for ACS. Epicardial pacing wires were removed on 05/16. He has been tolerating a diet and has had a bowel movement. All wounds are clean, dry, healing without signs of infection. Early 05/17, patient developed urinary retention and was in and out cath'd. He was already on Flomax. He then developed a fever to 102.7 and had confusion. Labs and PA/LAT CXR were obtained. CXR showed no sign of pneumonia. His wounds were free of sign of infection and he only had a saline line in place. UA, urine culture and blood cultures were ordered. CBC showed leukocytosis, pharmacy was consulted for antibiotics and empiric Unasyn was started for likely complicated UTI. He continued with urinary retention, foley catheter was placed.  He only developed a minor leukocytosis and fevers have been improving over time.  He developed diarrhea and stool was also sent for culture. Stool softeners were discontinued. Abdominal x-ray on 03/19/2023 was benign. He was also treated with Imodium.  During  this time he developed fairly significant weakness but this has shown a gradual improvement. A PT consult was obtained to assist with management, and they recommended home health at discharge. Foley catheter was removed on 05/19 and he began voiding independently without difficulty. UA showed hematuria and rare bacteria, urine culture showed no growth and blood cultures showed no growth. His fevers resolved and leukocytosis improved, pharmacy  was consulted for oral antibiotic coverage, IV Unasyn was discontinued and PO Augmentin was started for a total of 7 days antibiotic coverage per pharmacy recommendation.

## 2023-03-14 ENCOUNTER — Encounter (HOSPITAL_COMMUNITY): Payer: Self-pay | Admitting: Surgery

## 2023-03-14 ENCOUNTER — Inpatient Hospital Stay (HOSPITAL_COMMUNITY): Payer: Medicare Other

## 2023-03-14 ENCOUNTER — Other Ambulatory Visit: Payer: Self-pay

## 2023-03-14 LAB — GLUCOSE, CAPILLARY
Glucose-Capillary: 110 mg/dL — ABNORMAL HIGH (ref 70–99)
Glucose-Capillary: 111 mg/dL — ABNORMAL HIGH (ref 70–99)
Glucose-Capillary: 116 mg/dL — ABNORMAL HIGH (ref 70–99)
Glucose-Capillary: 118 mg/dL — ABNORMAL HIGH (ref 70–99)
Glucose-Capillary: 119 mg/dL — ABNORMAL HIGH (ref 70–99)
Glucose-Capillary: 124 mg/dL — ABNORMAL HIGH (ref 70–99)
Glucose-Capillary: 125 mg/dL — ABNORMAL HIGH (ref 70–99)
Glucose-Capillary: 127 mg/dL — ABNORMAL HIGH (ref 70–99)
Glucose-Capillary: 135 mg/dL — ABNORMAL HIGH (ref 70–99)

## 2023-03-14 LAB — CBC
HCT: 29.7 % — ABNORMAL LOW (ref 39.0–52.0)
HCT: 32.7 % — ABNORMAL LOW (ref 39.0–52.0)
Hemoglobin: 9.8 g/dL — ABNORMAL LOW (ref 13.0–17.0)
Hemoglobin: 10.6 g/dL — ABNORMAL LOW (ref 13.0–17.0)
MCH: 31.9 pg (ref 26.0–34.0)
MCH: 31.7 pg (ref 26.0–34.0)
MCHC: 33 g/dL (ref 30.0–36.0)
MCHC: 32.4 g/dL (ref 30.0–36.0)
MCV: 96.7 fL (ref 80.0–100.0)
MCV: 97.9 fL (ref 80.0–100.0)
Platelets: 95 10*3/uL — ABNORMAL LOW (ref 150–400)
Platelets: 105 10*3/uL — ABNORMAL LOW (ref 150–400)
RBC: 3.07 MIL/uL — ABNORMAL LOW (ref 4.22–5.81)
RBC: 3.34 MIL/uL — ABNORMAL LOW (ref 4.22–5.81)
RDW: 13.7 % (ref 11.5–15.5)
RDW: 13.6 % (ref 11.5–15.5)
WBC: 9.2 10*3/uL (ref 4.0–10.5)
WBC: 10.2 10*3/uL (ref 4.0–10.5)
nRBC: 0 % (ref 0.0–0.2)
nRBC: 0 % (ref 0.0–0.2)

## 2023-03-14 LAB — TYPE AND SCREEN
Donor AG Type: NEGATIVE
Donor AG Type: NEGATIVE
Donor AG Type: NEGATIVE
Unit division: 0
Unit division: 0
Unit division: 0
Unit division: 0

## 2023-03-14 LAB — BASIC METABOLIC PANEL
Anion gap: 8 (ref 5–15)
Anion gap: 9 (ref 5–15)
BUN: 14 mg/dL (ref 8–23)
BUN: 18 mg/dL (ref 8–23)
CO2: 22 mmol/L (ref 22–32)
CO2: 24 mmol/L (ref 22–32)
Calcium: 7.5 mg/dL — ABNORMAL LOW (ref 8.9–10.3)
Calcium: 7.7 mg/dL — ABNORMAL LOW (ref 8.9–10.3)
Chloride: 104 mmol/L (ref 98–111)
Chloride: 98 mmol/L (ref 98–111)
Creatinine, Ser: 0.97 mg/dL (ref 0.61–1.24)
Creatinine, Ser: 1.21 mg/dL (ref 0.61–1.24)
GFR, Estimated: >60 mL/min (ref >60)
GFR, Estimated: >60 mL/min (ref >60)
Glucose, Bld: 120 mg/dL — ABNORMAL HIGH (ref 70–99)
Glucose, Bld: 132 mg/dL — ABNORMAL HIGH (ref 70–99)
Potassium: 4.3 mmol/L (ref 3.5–5.1)
Potassium: 4 mmol/L (ref 3.5–5.1)
Sodium: 134 mmol/L — ABNORMAL LOW (ref 135–145)
Sodium: 131 mmol/L — ABNORMAL LOW (ref 135–145)

## 2023-03-14 LAB — BPAM RBC: Blood Product Expiration Date: 202406062359

## 2023-03-14 LAB — PREPARE PLATELET PHERESIS: Unit division: 0

## 2023-03-14 LAB — LIPOPROTEIN A (LPA): Lipoprotein (a): 17.3 nmol/L (ref <75.0)

## 2023-03-14 LAB — MAGNESIUM
Magnesium: 2.3 mg/dL (ref 1.7–2.4)
Magnesium: 2.4 mg/dL (ref 1.7–2.4)

## 2023-03-14 LAB — BPAM PLATELET PHERESIS: Unit Type and Rh: 9500

## 2023-03-14 MED ORDER — ASPIRIN 81 MG PO TBEC
81.0000 mg | DELAYED_RELEASE_TABLET | Freq: Every day | ORAL | Status: DC
  Administered 2023-03-14 – 2023-03-22 (×8): 81 mg via ORAL
  Filled 2023-03-14 (×9): qty 1

## 2023-03-14 MED ORDER — FUROSEMIDE 10 MG/ML IJ SOLN
40.0000 mg | Freq: Two times a day (BID) | INTRAMUSCULAR | Status: AC
  Administered 2023-03-14 (×2): 40 mg via INTRAVENOUS
  Filled 2023-03-14 (×2): qty 4

## 2023-03-14 MED ORDER — KETOROLAC TROMETHAMINE 15 MG/ML IJ SOLN: 15.0000 mg | Freq: Four times a day (QID) | INTRAMUSCULAR | Status: DC | PRN

## 2023-03-14 MED ORDER — ASPIRIN 81 MG PO CHEW
81.0000 mg | CHEWABLE_TABLET | Freq: Every day | ORAL | Status: DC
  Administered 2023-03-21: 81 mg
  Filled 2023-03-14: qty 1

## 2023-03-14 MED ORDER — INSULIN ASPART 100 UNIT/ML IJ SOLN
0.0000 [IU] | INTRAMUSCULAR | Status: DC
  Administered 2023-03-14 – 2023-03-15 (×3): 2 [IU] via SUBCUTANEOUS

## 2023-03-14 MED ORDER — ENOXAPARIN SODIUM 40 MG/0.4ML IJ SOSY: 40.0000 mg | PREFILLED_SYRINGE | Freq: Every day | INTRAMUSCULAR | Status: DC

## 2023-03-14 MED ORDER — PREGABALIN 100 MG PO CAPS
200.0000 mg | ORAL_CAPSULE | Freq: Two times a day (BID) | ORAL | Status: DC
  Administered 2023-03-14 – 2023-03-22 (×17): 200 mg via ORAL
  Filled 2023-03-14: qty 1
  Filled 2023-03-14 (×5): qty 2
  Filled 2023-03-14: qty 1
  Filled 2023-03-14 (×6): qty 2
  Filled 2023-03-14: qty 1
  Filled 2023-03-14 (×3): qty 2

## 2023-03-14 MED FILL — Thrombin (Recombinant) For Soln 20000 Unit: CUTANEOUS | Qty: 1 | Status: AC

## 2023-03-14 NOTE — Progress Notes (Signed)
1 Day Post-Op Procedure(s) (LRB): CORONARY ARTERY BYPASS GRAFTING (CABG) X3, USING RIGHT LEG GREATER SAPHENOUS VEIN HARVESTED ENDOSCOPICALLY & LEFT IMA (N/A) TRANSESOPHAGEAL ECHOCARDIOGRAM (N/A) Subjective: Feels ok other than surgical pain.  Objective: Vital signs in last 24 hours: Temp:  [95.9 F (35.5 C)-102 F (38.9 C)] 99.9 F (37.7 C) (05/14 0645) Pulse Rate:  [60-203] 71 (05/14 0645) Cardiac Rhythm: Normal sinus rhythm (05/14 0400) Resp:  [9-24] 16 (05/14 0645) BP: (90-138)/(49-76) 120/67 (05/14 0600) SpO2:  [94 %-100 %] 99 % (05/14 0645) Arterial Line BP: (94-290)/(40-284) 143/47 (05/14 0645) FiO2 (%):  [40 %-50 %] 40 % (05/13 1535) Weight:  [94.3 kg] 94.3 kg (05/14 0500)  Hemodynamic parameters for last 24 hours: PAP: (20-44)/(6-23) 27/10 CO:  [3.5 L/min-7.8 L/min] 4.8 L/min CI:  [1.7 L/min/m2-3.8 L/min/m2] 2.4 L/min/m2  Intake/Output from previous day: 05/13 0701 - 05/14 0700 In: 5749.7 [I.V.:3250.4; Blood:801; IV Piggyback:1698.3] Out: 3930 [Urine:2310; Blood:980; Chest Tube:640] Intake/Output this shift: No intake/output data recorded.  General appearance: alert and cooperative Neurologic: intact Heart: regular rate and rhythm, S1, S2 normal, no murmur Lungs: clear to auscultation bilaterally Extremities: edema moderate Wound: dressings dry  Lab Results: Recent Labs    03/13/23 2015 03/14/23 0413  WBC 9.2 9.2  HGB 10.2* 9.8*  HCT 31.2* 29.7*  PLT 103* 95*   BMET:  Recent Labs    03/13/23 2015 03/14/23 0413  NA 136 134*  K 4.3 4.3  CL 108 104  CO2 21* 22  GLUCOSE 118* 120*  BUN 12 14  CREATININE 0.99 0.97  CALCIUM 7.7* 7.5*    PT/INR:  Recent Labs    03/13/23 1244  LABPROT 15.6*  INR 1.2   ABG    Component Value Date/Time   PHART 7.311 (L) 03/13/2023 1701   HCO3 20.3 03/13/2023 1701   TCO2 21 (L) 03/13/2023 1701   ACIDBASEDEF 5.0 (H) 03/13/2023 1701   O2SAT 95 03/13/2023 1701   CBG (last 3)  Recent Labs    03/14/23 0202  03/14/23 0411 03/14/23 0618  GLUCAP 118* 125* 111*   CXR: ok  ECG: sinus, no acute changes  Assessment/Plan: S/P Procedure(s) (LRB): CORONARY ARTERY BYPASS GRAFTING (CABG) X3, USING RIGHT LEG GREATER SAPHENOUS VEIN HARVESTED ENDOSCOPICALLY & LEFT IMA (N/A) TRANSESOPHAGEAL ECHOCARDIOGRAM (N/A)  POD 1 Hemodynamically stable in sinus rhythm. Continue Lopressor.  Volume excess: Wt is 9 lbs over preop. Start diuresis.  DC chest tubes after dangle this am.  DC swan, arterial line.  Glucose under good control. Preop Hgb A1c normal. Transition to SSI.  Fever overnight. May be inflammatory or atelectasis.  IS, OOB.    LOS: 5 days    Alleen Borne 03/14/2023

## 2023-03-14 NOTE — TOC Initial Note (Signed)
Transition of Care [facility]) - Initial/Assessment Note    Patient Details  Name: Bruce Smith MRN: [id number] Date of Birth: 01-28-1951  [facility] CM/SW Contact:    Gala Lewandowsky, RN Phone Number: 03/14/2023, 12:49 PM  Clinical Narrative:  Patient presented for chest pain-POD-1 CABG. PTA patient was from home with spouse. Patient also has support of sister that was at the bedside during conversation. Patient has DME cane, rolling walker, and shower chair in the home. Case Manager will continue to follow for transition of care needs as the patient progresses.             Expected Discharge Plan: Home w Home Health Services Barriers to Discharge: Continued Medical Work up   Patient Goals and CMS Choice Patient states their goals for this hospitalization and ongoing recovery are:: to return home.   Choice offered to / list presented to : NA      Expected Discharge Plan and Services   Discharge Planning Services: CM Consult   Living arrangements for the past 2 months: Single Family Home                   DME Agency: NA  Prior Living Arrangements/Services Living arrangements for the past 2 months: Single Family Home Lives with:: Spouse Patient language and need for interpreter reviewed:: Yes Do you feel safe going back to the place where you live?: Yes      Need for Family Participation in Patient Care: Yes (Comment) Care giver support system in place?: Yes (comment) Current home services: DME (cane, rolling walker, shower chair,) Criminal Activity/Legal Involvement Pertinent to Current Situation/Hospitalization: No - Comment as needed  Activities of Daily Living Home Assistive Devices/Equipment: Eyeglasses, Blood pressure cuff, Dentures (specify type), Cane (specify quad or straight) ADL Screening (condition at time of admission) Patient's cognitive ability adequate to safely complete daily activities?: Yes Is the patient deaf or have difficulty  hearing?: No Does the patient have difficulty seeing, even when wearing glasses/contacts?: No Does the patient have difficulty concentrating, remembering, or making decisions?: No Patient able to express need for assistance with ADLs?: Yes Does the patient have difficulty dressing or bathing?: No Independently performs ADLs?: Yes (appropriate for developmental age) Does the patient have difficulty walking or climbing stairs?: No Weakness of Legs: None Weakness of Arms/Hands: None  Permission Sought/Granted Permission sought to share information with : Family Supports, Case Manager    Emotional Assessment Appearance:: Appears stated age Attitude/Demeanor/Rapport: Engaged Affect (typically observed): Appropriate Orientation: : Oriented to Situation, Oriented to  Time, Oriented to Place, Oriented to Self Alcohol / Substance Use: Not Applicable Psych Involvement: No (comment)  Admission diagnosis:  NSTEMI (non-ST elevated myocardial infarction) (HCC) [I21.4] Coronary artery disease [I25.10] Patient Active Problem List   Diagnosis Date Noted   Coronary artery disease 03/13/2023   NSTEMI (non-ST elevated myocardial infarction) (HCC) 03/09/2023   Hypomagnesemia 03/23/2021   Hypocalcemia 03/23/2021   Physical debility 03/10/2021   Debility    Enterococcus UTI    Pain in both feet    Malnutrition of moderate degree 03/05/2021   FTT (failure to thrive) in adult 02/23/2021   AKI (acute kidney injury) (HCC) 10/29/2020   Abnormal neurological exam 10/29/2020   Essential hypertension 10/29/2020   CAD (coronary artery disease) 10/29/2020   Intractable pain 10/26/2020   Kappa light chain myeloma (HCC) 07/21/2020   Hypercalcemia of malignancy 06/30/2020   Goals of care, counseling/discussion 06/29/2020   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   Cervical spondylosis with myelopathy and radiculopathy 06/17/2020   PCP:  Elijio Miles., MD Pharmacy:   [facility], [place] - [address] [place] [address] Phone: [phone] Fax: [phone]  [facility] - [place], [place] - [address] [place] [address] Phone: [phone] Fax: [phone]  [id number] - [place],  - [address] [place] [address] Phone: [phone] Fax: [phone]  Social Determinants of Health (SDOH) Social History: SDOH Screenings   Food Insecurity: No Food Insecurity (03/09/2023)  Housing: Low Risk  (03/09/2023)  Transportation Needs: No Transportation Needs (03/09/2023)  Utilities: Not At Risk (03/09/2023)  Financial Resource Strain: Low Risk  (10/05/2022)  Tobacco Use: High Risk (03/14/2023)   SDOH Interventions: Housing Interventions: Intervention Not Indicated   Readmission Risk Interventions     No data to display

## 2023-03-14 NOTE — Progress Notes (Signed)
     [address]       [place] [address]             [phone]       EVENING ROUNDS  POD #1 sp CABG Up walking in halls CT out No issues

## 2023-03-15 ENCOUNTER — Inpatient Hospital Stay (HOSPITAL_COMMUNITY): Payer: Medicare Other

## 2023-03-15 DIAGNOSIS — Z951 Presence of aortocoronary bypass graft: Secondary | ICD-10-CM

## 2023-03-15 LAB — GLUCOSE, CAPILLARY
Glucose-Capillary: 101 mg/dL — ABNORMAL HIGH (ref 70–99)
Glucose-Capillary: 113 mg/dL — ABNORMAL HIGH (ref 70–99)
Glucose-Capillary: 113 mg/dL — ABNORMAL HIGH (ref 70–99)
Glucose-Capillary: 116 mg/dL — ABNORMAL HIGH (ref 70–99)
Glucose-Capillary: 119 mg/dL — ABNORMAL HIGH (ref 70–99)
Glucose-Capillary: 141 mg/dL — ABNORMAL HIGH (ref 70–99)

## 2023-03-15 LAB — CBC
HCT: 28.8 % — ABNORMAL LOW (ref 39.0–52.0)
Hemoglobin: 9.2 g/dL — ABNORMAL LOW (ref 13.0–17.0)
MCH: 31.1 pg (ref 26.0–34.0)
MCHC: 31.9 g/dL (ref 30.0–36.0)
MCV: 97.3 fL (ref 80.0–100.0)
Platelets: 79 10*3/uL — ABNORMAL LOW (ref 150–400)
RBC: 2.96 MIL/uL — ABNORMAL LOW (ref 4.22–5.81)
RDW: 13.5 % (ref 11.5–15.5)
WBC: 7.2 10*3/uL (ref 4.0–10.5)
nRBC: 0 % (ref 0.0–0.2)

## 2023-03-15 LAB — BASIC METABOLIC PANEL
Anion gap: 8 (ref 5–15)
BUN: 17 mg/dL (ref 8–23)
CO2: 26 mmol/L (ref 22–32)
Calcium: 7.5 mg/dL — ABNORMAL LOW (ref 8.9–10.3)
Chloride: 99 mmol/L (ref 98–111)
Creatinine, Ser: 1.1 mg/dL (ref 0.61–1.24)
GFR, Estimated: >60 mL/min (ref >60)
Glucose, Bld: 116 mg/dL — ABNORMAL HIGH (ref 70–99)
Potassium: 3.9 mmol/L (ref 3.5–5.1)
Sodium: 133 mmol/L — ABNORMAL LOW (ref 135–145)

## 2023-03-15 LAB — MAGNESIUM: Magnesium: 2.1 mg/dL (ref 1.7–2.4)

## 2023-03-15 MED ORDER — POTASSIUM CHLORIDE CRYS ER 20 MEQ PO TBCR
20.0000 meq | EXTENDED_RELEASE_TABLET | Freq: Every day | ORAL | Status: DC
  Administered 2023-03-15: 20 meq via ORAL
  Filled 2023-03-15: qty 1

## 2023-03-15 MED ORDER — FUROSEMIDE 40 MG PO TABS
40.0000 mg | ORAL_TABLET | Freq: Every day | ORAL | Status: DC
  Administered 2023-03-15 – 2023-03-16 (×2): 40 mg via ORAL
  Filled 2023-03-15 (×2): qty 1

## 2023-03-15 NOTE — Progress Notes (Addendum)
[facility] DAILY ICU PROGRESS NOTE                   [address]            [place] [address]          [phone]   2 Days Post-Op Procedure(s) (LRB): CORONARY ARTERY BYPASS GRAFTING (CABG) X3, USING RIGHT LEG GREATER SAPHENOUS VEIN HARVESTED ENDOSCOPICALLY & LEFT IMA (N/A) TRANSESOPHAGEAL ECHOCARDIOGRAM (N/A)  Total Length of Stay:  LOS: 6 days   Subjective: Patient sitting in chair. He has no specific complaint this am  Objective: Vital signs in last 24 hours: Temp:  [99.1 F (37.3 C)-100.8 F (38.2 C)] 100.4 F (38 C) (05/15 0710) Pulse Rate:  [58-86] 76 (05/15 0710) Cardiac Rhythm: Normal sinus rhythm (05/15 0400) Resp:  [6-28] 14 (05/15 0710) BP: (97-123)/(45-60) 112/51 (05/15 0700) SpO2:  [85 %-100 %] 93 % (05/15 0710) Arterial Line BP: (126-149)/(42-47) 136/47 (05/14 1100) Weight:  [93.4 kg] 93.4 kg (05/15 0500)  Filed Weights   03/13/23 0422 03/14/23 0500 03/15/23 0500  Weight: 90.2 kg 94.3 kg 93.4 kg    Weight change: -0.9 kg   Hemodynamic parameters for last 24 hours: PAP: (20-32)/(5-12) 27/10 CO:  [4.3 L/min] 4.3 L/min CI:  [2.1 L/min/m2] 2.1 L/min/m2  Intake/Output from previous day: 05/14 0701 - 05/15 0700 In: 753.1 [I.V.:553.1; IV Piggyback:200] Out: 2560 [Urine:2470; Chest Tube:90]  Intake/Output this shift: No intake/output data recorded.  Current Meds: Scheduled Meds:  acetaminophen  1,000 mg Oral Q6H   Or   acetaminophen (TYLENOL) oral liquid 160 mg/5 mL  1,000 mg Per Tube Q6H   aspirin EC  81 mg Oral Daily   Or   aspirin  81 mg Per Tube Daily   bisacodyl  10 mg Oral Daily   Or   bisacodyl  10 mg Rectal Daily   calcium carbonate  1 tablet Oral BID WC   Chlorhexidine Gluconate Cloth  6 each Topical Daily   docusate sodium  200 mg Oral Daily   insulin aspart  0-24 Units Subcutaneous Q4H   levETIRAcetam  500 mg Oral BID   magnesium oxide  200 mg Oral Daily   metoprolol tartrate  12.5 mg Oral BID   Or   metoprolol  tartrate  12.5 mg Per Tube BID   pantoprazole  40 mg Oral BID AC   pregabalin  200 mg Oral BID   rosuvastatin  40 mg Oral Daily   saccharomyces boulardii  250 mg Oral Daily   sodium chloride flush  3 mL Intravenous Q12H   tamsulosin  0.4 mg Oral Daily   Continuous Infusions:  sodium chloride Stopped (03/14/23 1059)   sodium chloride     sodium chloride Stopped (03/14/23 1201)   lactated ringers Stopped (03/14/23 0536)   lactated ringers 20 mL/hr at 03/15/23 0600   nitroGLYCERIN Stopped (03/14/23 1039)   phenylephrine (NEO-SYNEPHRINE) Adult infusion Stopped (03/13/23 1428)   PRN Meds:.sodium chloride, ketorolac, metoprolol tartrate, morphine injection, ondansetron (ZOFRAN) IV, oxyCODONE, sodium chloride flush, traMADol  General appearance: alert, cooperative, and no distress Neurologic: intact Heart: RRR Lungs: Slightly diminished bibasilar breath sounds Abdomen: Soft, non tender, sporadic bowel sounds present Extremities: Mild LE edema. Mild ecchymosis RLE Wound: Sternal wound dressing intact and is clean and dry. RLE wounds are clean and dry. No sign of infection.  Lab Results: CBC: Recent Labs    03/14/23 1640 03/15/23 0351  WBC 10.2 7.2  HGB 10.6* 9.2*  HCT 32.7*  28.8*  PLT 105* 79*   BMET:  Recent Labs    03/14/23 1640 03/15/23 0351  NA 131* 133*  K 4.0 3.9  CL 98 99  CO2 24 26  GLUCOSE 132* 116*  BUN 18 17  CREATININE 1.21 1.10  CALCIUM 7.7* 7.5*    CMET: Lab Results  Component Value Date   WBC 7.2 03/15/2023   HGB 9.2 (L) 03/15/2023   HCT 28.8 (L) 03/15/2023   PLT 79 (L) 03/15/2023   GLUCOSE 116 (H) 03/15/2023   CHOL 173 03/09/2023   TRIG 168 (H) 03/09/2023   HDL 41 03/09/2023   LDLCALC 98 03/09/2023   ALT 19 02/22/2023   AST 17 02/22/2023   NA 133 (L) 03/15/2023   K 3.9 03/15/2023   CL 99 03/15/2023   CREATININE 1.10 03/15/2023   BUN 17 03/15/2023   CO2 26 03/15/2023   TSH 2.847 03/09/2023   INR 1.2 03/13/2023   HGBA1C 5.5 03/09/2023       PT/INR:  Recent Labs    03/13/23 1244  LABPROT 15.6*  INR 1.2   Radiology: No results found.   Assessment/Plan: S/P Procedure(s) (LRB): CORONARY ARTERY BYPASS GRAFTING (CABG) X3, USING RIGHT LEG GREATER SAPHENOUS VEIN HARVESTED ENDOSCOPICALLY & LEFT IMA (N/A) TRANSESOPHAGEAL ECHOCARDIOGRAM (N/A) CV-SR. On Lopressor 12.5 mg bid Pulmonary-On room air this am. CXR this am appears stable (? Trace left apical pneumothorax). Encourage incentive spirometer Volume overload-will give Lasix this am and daily Expected post op blood loss anemia-H and H this am decreased to 9.2 and 28.8 Fever to 100.6. WBC this am 7,200. May be line-to be removed today. He is using incentive spirometer and does not appear to be a lot of atelectasis on CXR. Monitor. 6. CBGs 135/116/116. Pre op HGA1C 5.5. No history of DM. Will stop accu checks and SS PRN upon transfer 7. Thrombocytopenia-platelets this am decreased to 79,000 8. Remove sleeve, foley 9. Transfer to 4E  Ardelle Balls 03/15/2023 7:37 AM   Chart reviewed, patient examined, agree with above. He looks good overall. Doing 1700 on IS. CXR looks good. Remove lines and foley. Continue diuresis. Transfer to 4E.

## 2023-03-16 ENCOUNTER — Ambulatory Visit: Payer: Medicare Other | Admitting: Radiation Oncology

## 2023-03-16 ENCOUNTER — Other Ambulatory Visit: Payer: Self-pay

## 2023-03-16 ENCOUNTER — Inpatient Hospital Stay (HOSPITAL_COMMUNITY): Payer: Medicare Other

## 2023-03-16 LAB — CBC
HCT: 28.9 % — ABNORMAL LOW (ref 39.0–52.0)
Hemoglobin: 9.4 g/dL — ABNORMAL LOW (ref 13.0–17.0)
MCH: 31.4 pg (ref 26.0–34.0)
MCHC: 32.5 g/dL (ref 30.0–36.0)
MCV: 96.7 fL (ref 80.0–100.0)
Platelets: 85 10*3/uL — ABNORMAL LOW (ref 150–400)
RBC: 2.99 MIL/uL — ABNORMAL LOW (ref 4.22–5.81)
RDW: 13.3 % (ref 11.5–15.5)
WBC: 5.7 10*3/uL (ref 4.0–10.5)
nRBC: 0 % (ref 0.0–0.2)

## 2023-03-16 LAB — BASIC METABOLIC PANEL
Anion gap: 6 (ref 5–15)
BUN: 17 mg/dL (ref 8–23)
CO2: 27 mmol/L (ref 22–32)
Calcium: 7.8 mg/dL — ABNORMAL LOW (ref 8.9–10.3)
Chloride: 101 mmol/L (ref 98–111)
Creatinine, Ser: 1.14 mg/dL (ref 0.61–1.24)
GFR, Estimated: >60 mL/min (ref >60)
Glucose, Bld: 113 mg/dL — ABNORMAL HIGH (ref 70–99)
Potassium: 3.8 mmol/L (ref 3.5–5.1)
Sodium: 134 mmol/L — ABNORMAL LOW (ref 135–145)

## 2023-03-16 LAB — GLUCOSE, CAPILLARY
Glucose-Capillary: 96 mg/dL (ref 70–99)
Glucose-Capillary: 102 mg/dL — ABNORMAL HIGH (ref 70–99)

## 2023-03-16 MED ORDER — FE FUM-VIT C-VIT B12-FA 460-60-0.01-1 MG PO CAPS
1.0000 | ORAL_CAPSULE | Freq: Every day | ORAL | Status: DC
  Administered 2023-03-16 – 2023-03-22 (×7): 1 via ORAL
  Filled 2023-03-16 (×7): qty 1

## 2023-03-16 MED ORDER — POTASSIUM CHLORIDE CRYS ER 20 MEQ PO TBCR: 20.0000 meq | EXTENDED_RELEASE_TABLET | Freq: Every day | ORAL | Status: DC

## 2023-03-16 MED ORDER — SORBITOL 70 % SOLN
30.0000 mL | Freq: Once | Status: AC
  Administered 2023-03-16: 30 mL via ORAL
  Filled 2023-03-16: qty 30

## 2023-03-16 MED ORDER — POTASSIUM CHLORIDE CRYS ER 20 MEQ PO TBCR
20.0000 meq | EXTENDED_RELEASE_TABLET | Freq: Two times a day (BID) | ORAL | Status: AC
  Administered 2023-03-16 (×2): 20 meq via ORAL
  Filled 2023-03-16 (×2): qty 1

## 2023-03-16 NOTE — Progress Notes (Addendum)
      [address]       [place] [address]             [phone]        3 Days Post-Op Procedure(s) (LRB): CORONARY ARTERY BYPASS GRAFTING (CABG) X3, USING RIGHT LEG GREATER SAPHENOUS VEIN HARVESTED ENDOSCOPICALLY & LEFT IMA (N/A) TRANSESOPHAGEAL ECHOCARDIOGRAM (N/A)  Subjective: Patient sleeping and awakened. He has no specific complaint. He has not had a bowel movement yet.  Objective: Vital signs in last 24 hours: Temp:  [98 F (36.7 C)-100.4 F (38 C)] 98 F (36.7 C) (05/16 0413) Pulse Rate:  [68-81] 75 (05/16 0413) Cardiac Rhythm: Normal sinus rhythm (05/15 2044) Resp:  [9-21] 13 (05/16 0413) BP: (104-119)/(46-94) 118/49 (05/16 0413) SpO2:  [89 %-100 %] 100 % (05/16 0413) Weight:  [92.1 kg] 92.1 kg (05/16 0556)  Pre op weight 90.2 kg Current Weight  03/16/23 92.1 kg       Intake/Output from previous day: 05/15 0701 - 05/16 0700 In: 609.7 [P.O.:480; I.V.:29.7; IV Piggyback:100] Out: 260 [Urine:260]   Physical Exam:  Cardiovascular: RRR Pulmonary: Clear to auscultation bilaterally Abdomen: Soft, non tender, bowel sounds present. Extremities: Mild bilateral lower extremity edema. Wounds: Sternal dressing removed and wound is clean and dry.  No erythema or signs of infection. RLE wounds are clean and dry  Lab Results: CBC: Recent Labs    03/15/23 0351 03/16/23 0549  WBC 7.2 5.7  HGB 9.2* 9.4*  HCT 28.8* 28.9*  PLT 79* 85*   BMET:  Recent Labs    03/15/23 0351 03/16/23 0549  NA 133* 134*  K 3.9 3.8  CL 99 101  CO2 26 27  GLUCOSE 116* 113*  BUN 17 17  CREATININE 1.10 1.14  CALCIUM 7.5* 7.8*    PT/INR:  Lab Results  Component Value Date   INR 1.2 03/13/2023   INR 1.0 03/09/2023   INR 1.1 10/19/2020   ABG:  INR: Will add last result for INR, ABG once components are confirmed Will add last 4 CBG results once components are confirmed  Assessment/Plan:  1. CV - S/p NSTEMI. Maintaining SR. On Lopressor 12.5 mg bid  (SBP 110's or less). Will start Plavix (for ACS) as has thrombocytopenia 2.  Pulmonary - On room air. PA/LAT CXR this am appears stable-cardiomegaly, left base atelectasis, ? miniscule left apical pneumothorax.Encourage incentive spirometer 3. Volume Overload - On Lasix 40 mg daily 4.  Expected post op acute blood loss anemia - H and H stable at 9.4 and 28.9. Start Trigels 5. Supplement potassium 6. Thrombocytopenia-platelets this am up to 85,000. He is not on Lovenox 7. Remove EPW 8. LOC constipation 9. Home 1-2 days  Bruce M ZimmermanPA-C 7:05 AM   Chart reviewed, patient examined, agree with above. He looks good overall. Continue IS, ambulation. Wt coming down. Would continue diuresis for a couple more days. Laxative today. Probably home Saturday.

## 2023-03-16 NOTE — Progress Notes (Signed)
CARDIAC REHAB PHASE I    Pt currently in bed and on bedrest. Will return to walk and educate tomorrow.     Woodroe Chen, RN BSN 03/16/2023 2:05 PM

## 2023-03-16 NOTE — Progress Notes (Signed)
Pacing wire removed per order,pt tolerated the procedure well,pt educated on bedrest

## 2023-03-16 NOTE — Care Management Important Message (Signed)
Important Message  Patient Details  Name: [place] MRN: [id number] Date of Birth: 1950/11/14   Medicare Important Message Given:  Yes     Renie Ora 03/16/2023, 8:39 AM

## 2023-03-17 ENCOUNTER — Inpatient Hospital Stay (HOSPITAL_COMMUNITY): Payer: Medicare Other

## 2023-03-17 LAB — BASIC METABOLIC PANEL
Anion gap: 9 (ref 5–15)
BUN: 19 mg/dL (ref 8–23)
CO2: 25 mmol/L (ref 22–32)
Calcium: 7.7 mg/dL — ABNORMAL LOW (ref 8.9–10.3)
Chloride: 99 mmol/L (ref 98–111)
Creatinine, Ser: 1.17 mg/dL (ref 0.61–1.24)
GFR, Estimated: >60 mL/min (ref >60)
Glucose, Bld: 132 mg/dL — ABNORMAL HIGH (ref 70–99)
Potassium: 4.4 mmol/L (ref 3.5–5.1)
Sodium: 133 mmol/L — ABNORMAL LOW (ref 135–145)

## 2023-03-17 LAB — URINALYSIS, ROUTINE W REFLEX MICROSCOPIC
Bilirubin Urine: NEGATIVE
Glucose, UA: NEGATIVE mg/dL
Ketones, ur: 20 mg/dL — AB
Leukocytes,Ua: NEGATIVE
Nitrite: NEGATIVE
Protein, ur: 100 mg/dL — AB
Specific Gravity, Urine: 1.025 (ref 1.005–1.030)
pH: 7 (ref 5.0–8.0)

## 2023-03-17 LAB — CBC
HCT: 30 % — ABNORMAL LOW (ref 39.0–52.0)
Hemoglobin: 10 g/dL — ABNORMAL LOW (ref 13.0–17.0)
MCH: 32.1 pg (ref 26.0–34.0)
MCHC: 33.3 g/dL (ref 30.0–36.0)
MCV: 96.2 fL (ref 80.0–100.0)
Platelets: 100 10*3/uL — ABNORMAL LOW (ref 150–400)
RBC: 3.12 MIL/uL — ABNORMAL LOW (ref 4.22–5.81)
RDW: 13.4 % (ref 11.5–15.5)
WBC: 11.4 10*3/uL — ABNORMAL HIGH (ref 4.0–10.5)
nRBC: 0 % (ref 0.0–0.2)

## 2023-03-17 LAB — GLUCOSE, CAPILLARY: Glucose-Capillary: 135 mg/dL — ABNORMAL HIGH (ref 70–99)

## 2023-03-17 MED ORDER — SODIUM CHLORIDE 0.9 % IV SOLN
3.0000 g | Freq: Three times a day (TID) | INTRAVENOUS | Status: DC
  Administered 2023-03-17 – 2023-03-20 (×9): 3 g via INTRAVENOUS
  Filled 2023-03-17 (×9): qty 8

## 2023-03-17 MED ORDER — CHLORHEXIDINE GLUCONATE CLOTH 2 % EX PADS
6.0000 | MEDICATED_PAD | Freq: Every day | CUTANEOUS | Status: DC
  Administered 2023-03-17 – 2023-03-18 (×2): 6 via TOPICAL

## 2023-03-17 MED FILL — Potassium Chloride Inj 2 mEq/ML: INTRAVENOUS | Qty: 40 | Status: AC

## 2023-03-17 MED FILL — Magnesium Sulfate Inj 50%: INTRAMUSCULAR | Qty: 10 | Status: AC

## 2023-03-17 MED FILL — Heparin Sodium (Porcine) Inj 1000 Unit/ML: Qty: 1000 | Status: AC

## 2023-03-17 NOTE — Progress Notes (Signed)
Patient had been sleeping and woke up by N.T. to do V.S. patient started grabbing in the air and eyes rolled. Patient was confused to date , place, and why he was here. CBG 135. Lupita Leash R.N. charge nurse in room and aware. Delma Post. with [facility] called and came to see patient. When I first assess him neuro. He could move all extrem. Ask him multi. Times to close his eye and he didn't PEARL . Skin warm and dry, Follow commands. Just generalized weakness.Wife at bedside and concern. Delma Post. assess patient and felt he was O.K. at this time. COnt. To monitor patient.

## 2023-03-17 NOTE — Progress Notes (Signed)
Patient has rectal temp. 102.7 Axillary, skin warm and dry resp. even and unlab. Enc I.S. gets up to 1750 . Patient unable to void bladder scan 378. Conley Simmonds. charge nurse aware. Dr. Leafy Ro on call and spoke with him about V.S. patient has some confusion. Had try to void x 2 standing and running water. He is uanble to. See orders for in and out cath and send for urine culture. In and out cath done 500 ml clear amber urine.  Delma Post with [facility] aware. Also applied ice packs to groin and axillary to help with fever. Cont. To monitor patient.

## 2023-03-17 NOTE — Progress Notes (Signed)
CARDIAC REHAB PHASE I   Pt remains febrile and feeling weak. Will defer ambulation and education for a later time. Will review pt condition and return later today to offer walk, as time and pt condition allows.   [id number]  Woodroe Chen, RN BSN 03/17/2023 11:14 AM

## 2023-03-17 NOTE — Progress Notes (Signed)
      [address]       [place] [address]             [phone]        4 Days Post-Op Procedure(s) (LRB): CORONARY ARTERY BYPASS GRAFTING (CABG) X3, USING RIGHT LEG GREATER SAPHENOUS VEIN HARVESTED ENDOSCOPICALLY & LEFT IMA (N/A) TRANSESOPHAGEAL ECHOCARDIOGRAM (N/A)  Subjective: Events of last night, early morning noted.  Objective: Vital signs in last 24 hours: Temp:  [97.6 F (36.4 C)-102.7 F (39.3 C)] 102.7 F (39.3 C) (05/17 0515) Pulse Rate:  [71-102] 95 (05/17 0515) Cardiac Rhythm: Normal sinus rhythm (05/16 2103) Resp:  [14-23] 16 (05/17 0515) BP: (107-142)/(41-66) 127/46 (05/17 0515) SpO2:  [94 %-100 %] 97 % (05/17 0515) Weight:  [92.1 kg] 92.1 kg (05/17 0300)  Pre op weight 90.2 kg Current Weight  03/17/23 92.1 kg       Intake/Output from previous day: 05/16 0701 - 05/17 0700 In: 840 [P.O.:840] Out: 500 [Urine:500]   Physical Exam:  Cardiovascular: RRR Pulmonary: Clear to auscultation bilaterally. Abdomen: Soft, non tender, bowel sounds present. Extremities: Mild bilateral lower extremity edema. Wounds: Clean and dry.  No erythema or signs of infection. No sign of wound infection on sternal wound or RLE wounds.  Lab Results: CBC: Recent Labs    03/15/23 0351 03/16/23 0549  WBC 7.2 5.7  HGB 9.2* 9.4*  HCT 28.8* 28.9*  PLT 79* 85*   BMET:  Recent Labs    03/15/23 0351 03/16/23 0549  NA 133* 134*  K 3.9 3.8  CL 99 101  CO2 26 27  GLUCOSE 116* 113*  BUN 17 17  CREATININE 1.10 1.14  CALCIUM 7.5* 7.8*    PT/INR:  Lab Results  Component Value Date   INR 1.2 03/13/2023   INR 1.0 03/09/2023   INR 1.1 10/19/2020   ABG:  INR: Will add last result for INR, ABG once components are confirmed Will add last 4 CBG results once components are confirmed  Assessment/Plan: 1. CV - S/p NSTEMI. Maintaining SR. On Lopressor 12.5 mg bid (SBP 110's or less). May be able to titrate BB;monitor this am. 2.  Pulmonary - On  room air. CXR ordered for this am.Encourage incentive spirometer 3. Volume Overload - On Lasix 40 mg daily 4.  Expected post op acute blood loss anemia - H and H stable at 9.4 and 28.9. Start Trigels 5. GU-urinary retention-in and out cath'd, check UC. He is already on Flomax 6. Thrombocytopenia-platelets this am up to 85,000. He is not on Lovenox 7. Fever to 102.7, generalized weakness-Etiology to be determined. Await this am's CBC 8. Await this am's labs   Shlonda Dolloff M ZimmermanPA-C 6:56 AM

## 2023-03-18 ENCOUNTER — Inpatient Hospital Stay (HOSPITAL_COMMUNITY): Payer: Medicare Other

## 2023-03-18 LAB — URINE CULTURE
Culture: NO GROWTH
Special Requests: NORMAL

## 2023-03-18 LAB — CBC
HCT: 27.6 % — ABNORMAL LOW (ref 39.0–52.0)
Hemoglobin: 9.1 g/dL — ABNORMAL LOW (ref 13.0–17.0)
MCH: 31 pg (ref 26.0–34.0)
MCHC: 33 g/dL (ref 30.0–36.0)
MCV: 93.9 fL (ref 80.0–100.0)
Platelets: 92 10*3/uL — ABNORMAL LOW (ref 150–400)
RBC: 2.94 MIL/uL — ABNORMAL LOW (ref 4.22–5.81)
RDW: 13.3 % (ref 11.5–15.5)
WBC: 11.8 10*3/uL — ABNORMAL HIGH (ref 4.0–10.5)
nRBC: 0 % (ref 0.0–0.2)

## 2023-03-18 LAB — BASIC METABOLIC PANEL
Anion gap: 9 (ref 5–15)
BUN: 20 mg/dL (ref 8–23)
CO2: 24 mmol/L (ref 22–32)
Calcium: 7.9 mg/dL — ABNORMAL LOW (ref 8.9–10.3)
Chloride: 99 mmol/L (ref 98–111)
Creatinine, Ser: 0.98 mg/dL (ref 0.61–1.24)
GFR, Estimated: >60 mL/min (ref >60)
Glucose, Bld: 127 mg/dL — ABNORMAL HIGH (ref 70–99)
Potassium: 3.8 mmol/L (ref 3.5–5.1)
Sodium: 132 mmol/L — ABNORMAL LOW (ref 135–145)

## 2023-03-18 MED ORDER — SODIUM CHLORIDE 0.9 % IV SOLN: 250.0000 mL | INTRAVENOUS | Status: DC | PRN

## 2023-03-18 MED ORDER — SODIUM CHLORIDE 0.9% FLUSH
3.0000 mL | Freq: Two times a day (BID) | INTRAVENOUS | Status: DC
  Administered 2023-03-18: 3 mL via INTRAVENOUS

## 2023-03-18 MED ORDER — CHLORHEXIDINE GLUCONATE CLOTH 2 % EX PADS: 6.0000 | MEDICATED_PAD | Freq: Every day | CUTANEOUS | Status: DC

## 2023-03-18 MED ORDER — ~~LOC~~ CARDIAC SURGERY, PATIENT & FAMILY EDUCATION: Freq: Once | Status: AC

## 2023-03-18 MED ORDER — SODIUM CHLORIDE 0.9% FLUSH: 3.0000 mL | INTRAVENOUS | Status: DC | PRN

## 2023-03-18 MED ORDER — LOPERAMIDE HCL 1 MG/7.5ML PO SUSP
1.0000 mg | ORAL | Status: DC | PRN
  Administered 2023-03-18: 1 mg via ORAL
  Filled 2023-03-18 (×2): qty 7.5

## 2023-03-18 NOTE — Plan of Care (Signed)
°  Problem: Education: °Goal: Knowledge of General Education information will improve °Description: Including pain rating scale, medication(s)/side effects and non-pharmacologic comfort measures °Outcome: Progressing °  °Problem: Health Behavior/Discharge Planning: °Goal: Ability to manage health-related needs will improve °Outcome: Progressing °  °Problem: Clinical Measurements: °Goal: Ability to maintain clinical measurements within normal limits will improve °Outcome: Progressing °Goal: Will remain free from infection °Outcome: Progressing °Goal: Diagnostic test results will improve °Outcome: Progressing °Goal: Respiratory complications will improve °Outcome: Progressing °Goal: Cardiovascular complication will be avoided °Outcome: Progressing °  °Problem: Activity: °Goal: Risk for activity intolerance will decrease °Outcome: Progressing °  °Problem: Nutrition: °Goal: Adequate nutrition will be maintained °Outcome: Progressing °  °Problem: Coping: °Goal: Level of anxiety will decrease °Outcome: Progressing °  °Problem: Elimination: °Goal: Will not experience complications related to bowel motility °Outcome: Progressing °Goal: Will not experience complications related to urinary retention °Outcome: Progressing °  °Problem: Pain Managment: °Goal: General experience of comfort will improve °Outcome: Progressing °  °Problem: Safety: °Goal: Ability to remain free from injury will improve °Outcome: Progressing °  °Problem: Skin Integrity: °Goal: Risk for impaired skin integrity will decrease °Outcome: Progressing °  °Problem: Education: °Goal: Understanding of CV disease, CV risk reduction, and recovery process will improve °Outcome: Progressing °Goal: Individualized Educational Video(s) °Outcome: Progressing °  °Problem: Activity: °Goal: Ability to return to baseline activity level will improve °Outcome: Progressing °  °Problem: Cardiovascular: °Goal: Ability to achieve and maintain adequate cardiovascular perfusion  will improve °Outcome: Progressing °  °Problem: Health Behavior/Discharge Planning: °Goal: Ability to safely manage health-related needs after discharge will improve °Outcome: Progressing °  °Problem: Education: °Goal: Will demonstrate proper wound care and an understanding of methods to prevent future damage °Outcome: Progressing °Goal: Knowledge of disease or condition will improve °Outcome: Progressing °Goal: Knowledge of the prescribed therapeutic regimen will improve °Outcome: Progressing °Goal: Individualized Educational Video(s) °Outcome: Progressing °  °Problem: Activity: °Goal: Risk for activity intolerance will decrease °Outcome: Progressing °  °Problem: Cardiac: °Goal: Will achieve and/or maintain hemodynamic stability °Outcome: Progressing °  °Problem: Clinical Measurements: °Goal: Postoperative complications will be avoided or minimized °Outcome: Progressing °  °Problem: Respiratory: °Goal: Respiratory status will improve °Outcome: Progressing °  °Problem: Skin Integrity: °Goal: Wound healing without signs and symptoms of infection °Outcome: Progressing °Goal: Risk for impaired skin integrity will decrease °Outcome: Progressing °  °Problem: Urinary Elimination: °Goal: Ability to achieve and maintain adequate renal perfusion and functioning will improve °Outcome: Progressing °  °

## 2023-03-18 NOTE — Progress Notes (Addendum)
[address]       [place] [address]             [phone]     5 Days Post-Op Procedure(s) (LRB): CORONARY ARTERY BYPASS GRAFTING (CABG) X3, USING RIGHT LEG GREATER SAPHENOUS VEIN HARVESTED ENDOSCOPICALLY & LEFT IMA (N/A) TRANSESOPHAGEAL ECHOCARDIOGRAM (N/A) Subjective: Feels weak with poor energy and strength, some non-prod cough  Objective: Vital signs in last 24 hours: Temp:  [98.9 F (37.2 C)-101.4 F (38.6 C)] 99.7 F (37.6 C) (05/18 0756) Pulse Rate:  [78-91] 86 (05/18 0756) Cardiac Rhythm: Normal sinus rhythm (05/18 0000) Resp:  [13-20] 19 (05/18 0756) BP: (123-135)/(49-58) 126/49 (05/18 0756) SpO2:  [96 %-97 %] 97 % (05/18 0756) Weight:  [92.3 kg] 92.3 kg (05/18 0319)  Hemodynamic parameters for last 24 hours:    Intake/Output from previous day: 05/17 0701 - 05/18 0700 In: 320 [P.O.:120; IV Piggyback:200] Out: 845 [Urine:845] Intake/Output this shift: No intake/output data recorded.  General appearance: cooperative, distracted, fatigued, and no distress Heart: regular rate and rhythm Lungs: very mildly dim in right base Abdomen: soft, non tender Extremities: no edema Wound: incis healing well  Lab Results: Recent Labs    03/16/23 0549 03/17/23 1110  WBC 5.7 11.4*  HGB 9.4* 10.0*  HCT 28.9* 30.0*  PLT 85* 100*   BMET:  Recent Labs    03/16/23 0549 03/17/23 1110  NA 134* 133*  K 3.8 4.4  CL 101 99  CO2 27 25  GLUCOSE 113* 132*  BUN 17 19  CREATININE 1.14 1.17  CALCIUM 7.8* 7.7*    PT/INR: No results for input(s): "LABPROT", "INR" in the last 72 hours. ABG    Component Value Date/Time   PHART 7.311 (L) 03/13/2023 1701   HCO3 20.3 03/13/2023 1701   TCO2 21 (L) 03/13/2023 1701   ACIDBASEDEF 5.0 (H) 03/13/2023 1701   O2SAT 95 03/13/2023 1701   CBG (last 3)  Recent Labs    03/16/23 0410 03/16/23 0746 03/17/23 0020  GLUCAP 96 102* 135*    Meds Scheduled Meds:  acetaminophen  1,000 mg Oral Q6H   Or    acetaminophen (TYLENOL) oral liquid 160 mg/5 mL  1,000 mg Per Tube Q6H   aspirin EC  81 mg Oral Daily   Or   aspirin  81 mg Per Tube Daily   bisacodyl  10 mg Oral Daily   Or   bisacodyl  10 mg Rectal Daily   calcium carbonate  1 tablet Oral BID WC   Chlorhexidine Gluconate Cloth  6 each Topical Daily   docusate sodium  200 mg Oral Daily   Fe Fum-Vit C-Vit B12-FA  1 capsule Oral QPC breakfast   levETIRAcetam  500 mg Oral BID   magnesium oxide  200 mg Oral Daily   metoprolol tartrate  12.5 mg Oral BID   Or   metoprolol tartrate  12.5 mg Per Tube BID   pantoprazole  40 mg Oral BID AC   pregabalin  200 mg Oral BID   rosuvastatin  40 mg Oral Daily   saccharomyces boulardii  250 mg Oral Daily   sodium chloride flush  3 mL Intravenous Q12H   tamsulosin  0.4 mg Oral Daily   Continuous Infusions:  sodium chloride Stopped (03/14/23 1059)   sodium chloride     ampicillin-sulbactam (UNASYN) IV Stopped (03/18/23 0544)   lactated ringers Stopped (03/14/23 0536)   Recent Results (from the past 240 hour(s))  SARS CORONAVIRUS 2 (  TAT 6-24 HRS) Anterior Nasal Swab     Status: None   Collection Time: 03/10/23  1:43 PM   Specimen: Anterior Nasal Swab  Result Value Ref Range Status   SARS Coronavirus 2 NEGATIVE NEGATIVE Final    Comment: (NOTE) SARS-CoV-2 target nucleic acids are NOT DETECTED.  The SARS-CoV-2 RNA is generally detectable in upper and lower respiratory specimens during the acute phase of infection. Negative results do not preclude SARS-CoV-2 infection, do not rule out co-infections with other pathogens, and should not be used as the sole basis for treatment or other patient management decisions. Negative results must be combined with clinical observations, patient history, and epidemiological information. The expected result is Negative.  Fact Sheet for Patients: HairSlick.no  Fact Sheet for Healthcare  Providers: quierodirigir.com  This test is not yet approved or cleared by the [place] FDA and  has been authorized for detection and/or diagnosis of SARS-CoV-2 by FDA under an Emergency Use Authorization (EUA). This EUA will remain  in effect (meaning this test can be used) for the duration of the COVID-19 declaration under Se ction 564(b)(1) of the Act, 21 U.S.C. section 360bbb-3(b)(1), unless the authorization is terminated or revoked sooner.  Performed at [facility], [address] [place], [place] [address]   Surgical pcr screen     Status: None   Collection Time: 03/11/23  3:23 PM   Specimen: Nasal Mucosa; Nasal Swab  Result Value Ref Range Status   MRSA, PCR NEGATIVE NEGATIVE Final   Staphylococcus aureus NEGATIVE NEGATIVE Final    Comment: (NOTE) The Xpert SA Assay (FDA approved for NASAL specimens in patients 12 years of age and older), is one component of a comprehensive surveillance program. It is not intended to diagnose infection nor to guide or monitor treatment. Performed at [facility], [address], [place], [place] [address]   Urine Culture     Status: None   Collection Time: 03/17/23  4:47 AM   Specimen: In/Out Cath Urine  Result Value Ref Range Status   Specimen Description IN/OUT CATH URINE  Final   Special Requests Normal  Final   Culture   Final    NO GROWTH Performed at [facility], [address] [place], [place] [address]    Report Status 03/18/2023 FINAL  Final  Culture, blood (Routine X 2) w Reflex to ID Panel     Status: None (Preliminary result)   Collection Time: 03/17/23 12:46 PM   Specimen: BLOOD  Result Value Ref Range Status   Specimen Description BLOOD BLOOD LEFT ARM  Final   Special Requests   Final    BOTTLES DRAWN AEROBIC AND ANAEROBIC Blood Culture adequate volume   Culture   Final    NO GROWTH < 24 HOURS Performed at [facility], [address] [place], [place]  [address]    Report Status PENDING  Incomplete  Culture, blood (Routine X 2) w Reflex to ID Panel     Status: None (Preliminary result)   Collection Time: 03/17/23 12:48 PM   Specimen: BLOOD  Result Value Ref Range Status   Specimen Description BLOOD BLOOD RIGHT ARM  Final   Special Requests   Final    BOTTLES DRAWN AEROBIC AND ANAEROBIC Blood Culture adequate volume   Culture   Final    NO GROWTH < 24 HOURS Performed at [facility], [address] [place], [place] [address]    Report Status PENDING  Incomplete       PRN Meds:.sodium chloride, metoprolol tartrate, ondansetron (ZOFRAN) IV, traMADol  Xrays DG CHEST PORT 1 VIEW  Result Date: 03/17/2023 CLINICAL DATA:  72 year old male postoperative CABG, day 4. EXAM: PORTABLE CHEST 1 VIEW COMPARISON:  03/16/2023 and earlier. FINDINGS: Portable AP semi upright view at 0759 hours. Patient is more rotated to the left. Ongoing low lung volumes. Stable cardiac size and mediastinal contours. No pneumothorax or pulmonary edema. Left costophrenic angle not completely included. No pleural effusion or consolidation identified. Bulky chronic dystrophic calcification projecting along the anterior left scapula. Sternotomy wires. Previous cervical ACDF. Negative visible bowel gas. IMPRESSION: Low lung volumes, otherwise no acute cardiopulmonary abnormality. Electronically Signed   By: Odessa Fleming M.D.   On: 03/17/2023 12:00    Assessment/Plan: S/P Procedure(s) (LRB): CORONARY ARTERY BYPASS GRAFTING (CABG) X3, USING RIGHT LEG GREATER SAPHENOUS VEIN HARVESTED ENDOSCOPICALLY & LEFT IMA (N/A) TRANSESOPHAGEAL ECHOCARDIOGRAM (N/A) POD#5   1 Tmax 101,4, VSS sinus rhythm 2 O2 sats good on RA 3 UOP - not all measured, now has foley as was having difficulty voiding and retention, cultures neg so far, UA- hematuria, rare bact, mild leukocytosis- WBC 11.4, - currently on unasyn IV, weight up about 3 kg but not drinking much, lasix was d/c'd. He had some  diarrhea so will check stool culture. 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 Dorris Fetch, MD [facility] [phone]

## 2023-03-18 NOTE — Progress Notes (Signed)
PT Cancellation Note  Patient Details Name: Bruce Smith MRN: [id number] DOB: 11-02-1950   Cancelled Treatment:    Reason Eval/Treat Not Completed: Fatigue/lethargy limiting ability to participate.  Patient has had diarrhea all day and is fatigued.  Requested PT return tomorrow.   Olivia Canter 03/18/2023, 4:41 PM

## 2023-03-18 NOTE — Progress Notes (Signed)
CARDIAC REHAB PHASE I      Came by to offer walk. Per floor staff pt has been on and off BSC all morning. He is currently on [facility]. Pt continues to feel weak. Will ask mobility team to see pt and could benefit from PT/OT. Will continue to follow.       Woodroe Chen, RN BSN 03/18/2023 1000

## 2023-03-19 ENCOUNTER — Inpatient Hospital Stay (HOSPITAL_COMMUNITY): Payer: Medicare Other

## 2023-03-19 LAB — CBC
HCT: 25 % — ABNORMAL LOW (ref 39.0–52.0)
Hemoglobin: 8.3 g/dL — ABNORMAL LOW (ref 13.0–17.0)
MCH: 31.2 pg (ref 26.0–34.0)
MCHC: 33.2 g/dL (ref 30.0–36.0)
MCV: 94 fL (ref 80.0–100.0)
Platelets: 94 10*3/uL — ABNORMAL LOW (ref 150–400)
RBC: 2.66 MIL/uL — ABNORMAL LOW (ref 4.22–5.81)
RDW: 13.3 % (ref 11.5–15.5)
WBC: 11 10*3/uL — ABNORMAL HIGH (ref 4.0–10.5)
nRBC: 0 % (ref 0.0–0.2)

## 2023-03-19 LAB — CULTURE, BLOOD (ROUTINE X 2): Special Requests: ADEQUATE

## 2023-03-19 LAB — BASIC METABOLIC PANEL
Anion gap: 8 (ref 5–15)
BUN: 17 mg/dL (ref 8–23)
CO2: 23 mmol/L (ref 22–32)
Calcium: 7.7 mg/dL — ABNORMAL LOW (ref 8.9–10.3)
Chloride: 102 mmol/L (ref 98–111)
Creatinine, Ser: 0.98 mg/dL (ref 0.61–1.24)
GFR, Estimated: >60 mL/min (ref >60)
Glucose, Bld: 107 mg/dL — ABNORMAL HIGH (ref 70–99)
Potassium: 3.7 mmol/L (ref 3.5–5.1)
Sodium: 133 mmol/L — ABNORMAL LOW (ref 135–145)

## 2023-03-19 MED ORDER — GERHARDT'S BUTT CREAM
TOPICAL_CREAM | Freq: Four times a day (QID) | CUTANEOUS | Status: DC | PRN
  Filled 2023-03-19 (×2): qty 1

## 2023-03-19 NOTE — Plan of Care (Signed)
  Problem: Education: Goal: Knowledge of General Education information will improve Description: Including pain rating scale, medication(s)/side effects and non-pharmacologic comfort measures Outcome: Not Progressing   Problem: Health Behavior/Discharge Planning: Goal: Ability to manage health-related needs will improve Outcome: Not Progressing   Problem: Clinical Measurements: Goal: Ability to maintain clinical measurements within normal limits will improve Outcome: Not Progressing Goal: Will remain free from infection Outcome: Not Progressing Goal: Diagnostic test results will improve Outcome: Not Progressing Goal: Respiratory complications will improve Outcome: Not Progressing Goal: Cardiovascular complication will be avoided Outcome: Not Progressing   Problem: Activity: Goal: Risk for activity intolerance will decrease Outcome: Not Progressing   Problem: Nutrition: Goal: Adequate nutrition will be maintained Outcome: Not Progressing   Problem: Coping: Goal: Level of anxiety will decrease Outcome: Not Progressing   Problem: Elimination: Goal: Will not experience complications related to bowel motility Outcome: Not Progressing Goal: Will not experience complications related to urinary retention Outcome: Not Progressing   Problem: Pain Managment: Goal: General experience of comfort will improve Outcome: Not Progressing   Problem: Safety: Goal: Ability to remain free from injury will improve Outcome: Not Progressing   Problem: Skin Integrity: Goal: Risk for impaired skin integrity will decrease Outcome: Not Progressing   Problem: Education: Goal: Understanding of CV disease, CV risk reduction, and recovery process will improve Outcome: Not Progressing Goal: Individualized Educational Video(s) Outcome: Not Progressing   Problem: Activity: Goal: Ability to return to baseline activity level will improve Outcome: Not Progressing   Problem:  Cardiovascular: Goal: Ability to achieve and maintain adequate cardiovascular perfusion will improve Outcome: Not Progressing   Problem: Health Behavior/Discharge Planning: Goal: Ability to safely manage health-related needs after discharge will improve Outcome: Not Progressing   Problem: Education: Goal: Will demonstrate proper wound care and an understanding of methods to prevent future damage Outcome: Not Progressing Goal: Knowledge of disease or condition will improve Outcome: Not Progressing Goal: Knowledge of the prescribed therapeutic regimen will improve Outcome: Not Progressing Goal: Individualized Educational Video(s) Outcome: Not Progressing   Problem: Activity: Goal: Risk for activity intolerance will decrease Outcome: Not Progressing   Problem: Cardiac: Goal: Will achieve and/or maintain hemodynamic stability Outcome: Not Progressing   Problem: Clinical Measurements: Goal: Postoperative complications will be avoided or minimized Outcome: Not Progressing   Problem: Respiratory: Goal: Respiratory status will improve Outcome: Not Progressing   Problem: Skin Integrity: Goal: Wound healing without signs and symptoms of infection Outcome: Not Progressing Goal: Risk for impaired skin integrity will decrease Outcome: Not Progressing   Problem: Urinary Elimination: Goal: Ability to achieve and maintain adequate renal perfusion and functioning will improve Outcome: Not Progressing

## 2023-03-19 NOTE — Progress Notes (Addendum)
[address]       [place] [address]             [phone]     6 Days Post-Op Procedure(s) (LRB): CORONARY ARTERY BYPASS GRAFTING (CABG) X3, USING RIGHT LEG GREATER SAPHENOUS VEIN HARVESTED ENDOSCOPICALLY & LEFT IMA (N/A) TRANSESOPHAGEAL ECHOCARDIOGRAM (N/A) Subjective: Feels better, appears stronger, + nausea and still with diarhhea  Objective: Vital signs in last 24 hours: Temp:  [98.2 F (36.8 C)-99.7 F (37.6 C)] 99.7 F (37.6 C) (05/19 0316) Pulse Rate:  [75-83] 83 (05/19 0400) Cardiac Rhythm: Normal sinus rhythm;Atrial fibrillation;Other (Comment) (05/19 0351) Resp:  [14-20] 20 (05/19 0400) BP: (118-131)/(46-59) 131/51 (05/19 0400) SpO2:  [94 %-99 %] 94 % (05/19 0400) Weight:  [93 kg] 93 kg (05/19 0500)  Hemodynamic parameters for last 24 hours:    Intake/Output from previous day: 05/18 0701 - 05/19 0700 In: 1400 [P.O.:1200; IV Piggyback:200] Out: 1325 [Urine:1325] Intake/Output this shift: No intake/output data recorded.  General appearance: alert, cooperative, and no distress Heart: regular rate and rhythm Lungs: clear to auscultation bilaterally Abdomen: scant BS, non tender or distended Extremities: no edema Wound: incis healing well  Lab Results: Recent Labs    03/18/23 1223 03/19/23 0139  WBC 11.8* 11.0*  HGB 9.1* 8.3*  HCT 27.6* 25.0*  PLT 92* 94*   BMET:  Recent Labs    03/18/23 1223 03/19/23 0139  NA 132* 133*  K 3.8 3.7  CL 99 102  CO2 24 23  GLUCOSE 127* 107*  BUN 20 17  CREATININE 0.98 0.98  CALCIUM 7.9* 7.7*    PT/INR: No results for input(s): "LABPROT", "INR" in the last 72 hours. ABG    Component Value Date/Time   PHART 7.311 (L) 03/13/2023 1701   HCO3 20.3 03/13/2023 1701   TCO2 21 (L) 03/13/2023 1701   ACIDBASEDEF 5.0 (H) 03/13/2023 1701   O2SAT 95 03/13/2023 1701   CBG (last 3)  Recent Labs    03/17/23 0020  GLUCAP 135*    Meds Scheduled Meds:  aspirin EC  81 mg Oral Daily   Or    aspirin  81 mg Per Tube Daily   bisacodyl  10 mg Oral Daily   Or   bisacodyl  10 mg Rectal Daily   calcium carbonate  1 tablet Oral BID WC   Chlorhexidine Gluconate Cloth  6 each Topical Q0600   docusate sodium  200 mg Oral Daily   Fe Fum-Vit C-Vit B12-FA  1 capsule Oral QPC breakfast   levETIRAcetam  500 mg Oral BID   magnesium oxide  200 mg Oral Daily   metoprolol tartrate  12.5 mg Oral BID   Or   metoprolol tartrate  12.5 mg Per Tube BID   pantoprazole  40 mg Oral BID AC   pregabalin  200 mg Oral BID   rosuvastatin  40 mg Oral Daily   saccharomyces boulardii  250 mg Oral Daily   tamsulosin  0.4 mg Oral Daily   Continuous Infusions:  ampicillin-sulbactam (UNASYN) IV Stopped (03/19/23 0534)   PRN Meds:.Gerhardt's butt cream, loperamide HCl, metoprolol tartrate, ondansetron (ZOFRAN) IV, traMADol   Recent Results (from the past 240 hour(s))  SARS CORONAVIRUS 2 (TAT 6-24 HRS) Anterior Nasal Swab     Status: None   Collection Time: 03/10/23  1:43 PM   Specimen: Anterior Nasal Swab  Result Value Ref Range Status   SARS Coronavirus 2 NEGATIVE NEGATIVE Final    Comment: (NOTE) SARS-CoV-2  target nucleic acids are NOT DETECTED.  The SARS-CoV-2 RNA is generally detectable in upper and lower respiratory specimens during the acute phase of infection. Negative results do not preclude SARS-CoV-2 infection, do not rule out co-infections with other pathogens, and should not be used as the sole basis for treatment or other patient management decisions. Negative results must be combined with clinical observations, patient history, and epidemiological information. The expected result is Negative.  Fact Sheet for Patients: HairSlick.no  Fact Sheet for Healthcare Providers: quierodirigir.com  This test is not yet approved or cleared by the [place] FDA and  has been authorized for detection and/or diagnosis of SARS-CoV-2 by FDA  under an Emergency Use Authorization (EUA). This EUA will remain  in effect (meaning this test can be used) for the duration of the COVID-19 declaration under Se ction 564(b)(1) of the Act, 21 U.S.C. section 360bbb-3(b)(1), unless the authorization is terminated or revoked sooner.  Performed at [facility], [address], [place], [place] [address]   Surgical pcr screen     Status: None   Collection Time: 03/11/23  3:23 PM   Specimen: Nasal Mucosa; Nasal Swab  Result Value Ref Range Status   MRSA, PCR NEGATIVE NEGATIVE Final   Staphylococcus aureus NEGATIVE NEGATIVE Final    Comment: (NOTE) The Xpert SA Assay (FDA approved for NASAL specimens in patients 52 years of age and older), is one component of a comprehensive surveillance program. It is not intended to diagnose infection nor to guide or monitor treatment. Performed at [facility], [address], [place], [place] [address]   Urine Culture     Status: None   Collection Time: 03/17/23  4:47 AM   Specimen: In/Out Cath Urine  Result Value Ref Range Status   Specimen Description IN/OUT CATH URINE  Final   Special Requests Normal  Final   Culture   Final    NO GROWTH Performed at [facility], [address] [place], [place] [address]    Report Status 03/18/2023 FINAL  Final  Culture, blood (Routine X 2) w Reflex to ID Panel     Status: None (Preliminary result)   Collection Time: 03/17/23 12:46 PM   Specimen: BLOOD  Result Value Ref Range Status   Specimen Description BLOOD BLOOD LEFT ARM  Final   Special Requests   Final    BOTTLES DRAWN AEROBIC AND ANAEROBIC Blood Culture adequate volume   Culture   Final    NO GROWTH 2 DAYS Performed at [facility], [address] [place], [place] [address]    Report Status PENDING  Incomplete  Culture, blood (Routine X 2) w Reflex to ID Panel     Status: None (Preliminary result)   Collection Time: 03/17/23 12:48 PM   Specimen: BLOOD  Result Value  Ref Range Status   Specimen Description BLOOD BLOOD RIGHT ARM  Final   Special Requests   Final    BOTTLES DRAWN AEROBIC AND ANAEROBIC Blood Culture adequate volume   Culture   Final    NO GROWTH 2 DAYS Performed at [facility], [address], [place], [place] [address]    Report Status PENDING  Incomplete       Xrays DG Chest 2 View  Result Date: 03/18/2023 CLINICAL DATA:  Follow-up of pleural effusion.  CABG 4 days ago EXAM: CHEST - 2 VIEW COMPARISON:  Yesterday FINDINGS: Lateral view degraded by patient arm position. Median sternotomy for  CABG. Cervical spine fixation. Remote bilateral rib fractures. Apical lordotic positioning on the frontal. Midline trachea. Mild cardiomegaly. Tiny bilateral pleural effusions on the lateral view. No pneumothorax. Diffuse peribronchial thickening. No congestive failure. Similar left base atelectasis. IMPRESSION: Cardiomegaly with similar left base atelectasis. Trace bilateral pleural fluid. Electronically Signed   By: Jeronimo Greaves M.D.   On: 03/18/2023 15:12    Assessment/Plan: S/P Procedure(s) (LRB): CORONARY ARTERY BYPASS GRAFTING (CABG) X3, USING RIGHT LEG GREATER SAPHENOUS VEIN HARVESTED ENDOSCOPICALLY & LEFT IMA (N/A) TRANSESOPHAGEAL ECHOCARDIOGRAM (N/A) POD#6   1 Tmax 99.7, VSS sinus rhythm, afib- short burst at 3 am- no recurrence, will hold on RX for now unless recurs 2 O2 sats good on RA 3 good UOP- will remove foley this am 4 frequent stooling is slowing down, culture pending- will get abdominal xray - he may be constipated with loose stooling the only portion coming out, will stop immodium for now 5 other micro neg so far 6 normal renal fxn 7 minor hyponatremia- improved 8 WBC stable at 11 9 ABLA- expected , slow trend lower- cont iron Pulm hygiene/rehab- routine     LOS: 10 days    Rowe Clack PA-C Pager [phone]  03/19/2023 Patient seen and examined, agree with above Plan discussed with Mr. Doylene Canning Looks much  better and fever down but still having diarrhea Abdominal films result pending but appear unremarkable  Viviann Spare C. Dorris Fetch, MD [facility] [phone]

## 2023-03-19 NOTE — Evaluation (Signed)
Physical Therapy Evaluation Patient Details Name: Navid Fudge MRN: [id number] DOB: Feb 05, 1951 Today's Date: 03/19/2023  History of Present Illness  Pt is a 72 year old male admitted on 03/08/23 with chest pain. Past history of coronary disease and multiple PCI, multiple myeloma with prior stem cell transplant. Pt underwent CABG x3 on 03/13/23.  Clinical Impression  Pt presents with admitting diagnosis above. Pt was limited by nausea and diarrhea today however was able to ambulate with RW to bathroom and back at supervision level. Pt also required constant cues to maintain sternal precautions. Anticipate that pt will make good progress once diarrhea is resolved. Recommend HHPT upon DC. PT will continue to follow.        Recommendations for follow up therapy are one component of a multi-disciplinary discharge planning process, led by the attending physician.  Recommendations may be updated based on patient status, additional functional criteria and insurance authorization.  Follow Up Recommendations       Assistance Recommended at Discharge Frequent or constant Supervision/Assistance  Patient can return home with the following  A little help with walking and/or transfers;A little help with bathing/dressing/bathroom;Assistance with cooking/housework;Direct supervision/assist for medications management;Assist for transportation;Help with stairs or ramp for entrance    Equipment Recommendations None recommended by PT  Recommendations for Other Services       Functional Status Assessment Patient has had a recent decline in their functional status and demonstrates the ability to make significant improvements in function in a reasonable and predictable amount of time.     Precautions / Restrictions Precautions Precautions: Fall;Sternal Precaution Booklet Issued: Yes (comment) Precaution Comments: Pt sister states that they already have booklet from previous staff member. Restrictions Weight  Bearing Restrictions: No      Mobility  Bed Mobility Overal bed mobility: Needs Assistance Bed Mobility: Supine to Sit, Sit to Supine     Supine to sit: Mod assist Sit to supine: Min assist   General bed mobility comments: Assistance with trunk elevation and cues for maintaining sternal precautions    Transfers Overall transfer level: Needs assistance Equipment used: Rolling walker (2 wheels) Transfers: Sit to/from Stand Sit to Stand: Min guard, Min assist           General transfer comment: x2 from bed and toilet. Min A once fatigued. Cues for maintaining sternal precautions.    Ambulation/Gait Ambulation/Gait assistance: Supervision Gait Distance (Feet): 15 Feet Assistive device: Rolling walker (2 wheels) Gait Pattern/deviations: Decreased stride length, Step-through pattern Gait velocity: decreased     General Gait Details: Distance limited by pt having to go to restroom due to diarrhea.  Stairs            Wheelchair Mobility    Modified Rankin (Stroke Patients Only)       Balance Overall balance assessment: Mild deficits observed, not formally tested                                           Pertinent Vitals/Pain Pain Assessment Pain Assessment: 0-10 Pain Score: 5  Pain Location: Left leg and chest at incision site Pain Descriptors / Indicators: Constant, Discomfort, Grimacing Pain Intervention(s): Monitored during session    Home Living Family/patient expects to be discharged to:: Private residence Living Arrangements: Spouse/significant other Available Help at Discharge: Family;Available 24 hours/day Type of Home: House Home Access: Ramped entrance     Alternate Level Stairs-Number of  Steps: flight Home Layout: Two level;Able to live on main level with bedroom/bathroom Home Equipment: Rollator (4 wheels);Cane - single Librarian, academic (2 wheels);BSC/3in1;Shower seat - built in;Grab bars - tub/shower;Hand held  shower head;Wheelchair - manual      Prior Function Prior Level of Function : Independent/Modified Independent;Driving             Mobility Comments: Mod I SPC ADLs Comments: Ind     Hand Dominance   Dominant Hand: Right    Extremity/Trunk Assessment   Upper Extremity Assessment Upper Extremity Assessment: Generalized weakness    Lower Extremity Assessment Lower Extremity Assessment: Generalized weakness    Cervical / Trunk Assessment Cervical / Trunk Assessment: Normal  Communication   Communication: No difficulties  Cognition Arousal/Alertness: Awake/alert Behavior During Therapy: WFL for tasks assessed/performed Overall Cognitive Status: Within Functional Limits for tasks assessed                                          General Comments General comments (skin integrity, edema, etc.): VSS on RA    Exercises     Assessment/Plan    PT Assessment Patient needs continued PT services  PT Problem List Decreased strength;Decreased range of motion;Decreased activity tolerance;Decreased balance;Decreased mobility;Decreased coordination;Decreased knowledge of precautions;Decreased knowledge of use of DME;Decreased safety awareness;Cardiopulmonary status limiting activity       PT Treatment Interventions DME instruction;Gait training;Therapeutic activities;Stair training;Functional mobility training;Therapeutic exercise;Neuromuscular re-education;Patient/family education    PT Goals (Current goals can be found in the Care Plan section)  Acute Rehab PT Goals Patient Stated Goal: to go home PT Goal Formulation: With patient Time For Goal Achievement: 04/02/23 Potential to Achieve Goals: Fair    Frequency Min 1X/week     Co-evaluation               AM-PAC PT "6 Clicks" Mobility  Outcome Measure Help needed turning from your back to your side while in a flat bed without using bedrails?: A Little Help needed moving from lying on your back  to sitting on the side of a flat bed without using bedrails?: A Little Help needed moving to and from a bed to a chair (including a wheelchair)?: A Little Help needed standing up from a chair using your arms (e.g., wheelchair or bedside chair)?: A Little Help needed to walk in hospital room?: A Little Help needed climbing 3-5 steps with a railing? : A Lot 6 Click Score: 17    End of Session Equipment Utilized During Treatment: Gait belt Activity Tolerance: Other (comment);Patient tolerated treatment well (Pt limited by diarrhea) Patient left: in bed;with call bell/phone within reach;with family/visitor present Nurse Communication: Mobility status PT Visit Diagnosis: Other abnormalities of gait and mobility (R26.89)    Time: 1610-9604 PT Time Calculation (min) (ACUTE ONLY): 34 min   Charges:   PT Evaluation $PT Eval Moderate Complexity: 1 Mod PT Treatments $Therapeutic Activity: 8-22 mins        Shela Nevin, PT, DPT Acute Rehab Services [phone]   Gladys Damme 03/19/2023, 5:16 PM

## 2023-03-20 LAB — CULTURE, BLOOD (ROUTINE X 2)
Culture: NO GROWTH
Special Requests: ADEQUATE

## 2023-03-20 MED ORDER — METOPROLOL TARTRATE 25 MG/10 ML ORAL SUSPENSION
12.5000 mg | Freq: Two times a day (BID) | ORAL | Status: DC
  Filled 2023-03-20 (×2): qty 5

## 2023-03-20 MED ORDER — POTASSIUM CHLORIDE CRYS ER 20 MEQ PO TBCR
40.0000 meq | EXTENDED_RELEASE_TABLET | Freq: Once | ORAL | Status: AC
  Administered 2023-03-20: 40 meq via ORAL
  Filled 2023-03-20: qty 2

## 2023-03-20 MED ORDER — MAGNESIUM OXIDE -MG SUPPLEMENT 400 (240 MG) MG PO TABS: 200.0000 mg | ORAL_TABLET | Freq: Every day | ORAL | Status: DC

## 2023-03-20 MED ORDER — AMOXICILLIN-POT CLAVULANATE 875-125 MG PO TABS
1.0000 | ORAL_TABLET | Freq: Two times a day (BID) | ORAL | Status: DC
  Administered 2023-03-20 – 2023-03-22 (×5): 1 via ORAL
  Filled 2023-03-20 (×5): qty 1

## 2023-03-20 MED ORDER — METOPROLOL TARTRATE 25 MG PO TABS
25.0000 mg | ORAL_TABLET | Freq: Two times a day (BID) | ORAL | Status: DC
  Administered 2023-03-20 – 2023-03-22 (×5): 25 mg via ORAL
  Filled 2023-03-20 (×5): qty 1

## 2023-03-20 NOTE — Care Management Important Message (Signed)
Important Message  Patient Details  Name: Bruce Smith MRN: [id number] Date of Birth: August 20, 1951   Medicare Important Message Given:  Yes     Renie Ora 03/20/2023, 9:12 AM

## 2023-03-20 NOTE — Progress Notes (Addendum)
[address]       [place] [address]             [phone]      7 Days Post-Op Procedure(s) (LRB): CORONARY ARTERY BYPASS GRAFTING (CABG) X3, USING RIGHT LEG GREATER SAPHENOUS VEIN HARVESTED ENDOSCOPICALLY & LEFT IMA (N/A) TRANSESOPHAGEAL ECHOCARDIOGRAM (N/A) Subjective: Patient states he is feeling better, frequent stooling has improved and his appetite is improving.   Objective: Vital signs in last 24 hours: Temp:  [98 F (36.7 C)-98.4 F (36.9 C)] 98.1 F (36.7 C) (05/20 0342) Pulse Rate:  [73-80] 73 (05/20 0342) Cardiac Rhythm: Normal sinus rhythm (05/19 1900) Resp:  [15-20] 20 (05/20 0600) BP: (112-146)/(53-63) 146/58 (05/20 0342) SpO2:  [95 %-99 %] 97 % (05/20 0600) Weight:  [89.3 kg] 89.3 kg (05/20 0342)  Hemodynamic parameters for last 24 hours:    Intake/Output from previous day: 05/19 0701 - 05/20 0700 In: 780 [P.O.:480; IV Piggyback:300] Out: 750 [Urine:750] Intake/Output this shift: No intake/output data recorded.  General appearance: alert, cooperative, and no distress Neurologic: intact Heart: regular rate and rhythm, S1, S2 normal, no murmur, click, rub or gallop Lungs: clear to auscultation bilaterally Abdomen: soft, non-tender; bowel sounds normal; no masses,  no organomegaly Extremities: extremities normal, atraumatic, no cyanosis or edema Wound: Clean and dry without erythema or sign of infection  Lab Results: Recent Labs    03/18/23 1223 03/19/23 0139  WBC 11.8* 11.0*  HGB 9.1* 8.3*  HCT 27.6* 25.0*  PLT 92* 94*   BMET:  Recent Labs    03/18/23 1223 03/19/23 0139  NA 132* 133*  K 3.8 3.7  CL 99 102  CO2 24 23  GLUCOSE 127* 107*  BUN 20 17  CREATININE 0.98 0.98  CALCIUM 7.9* 7.7*    PT/INR: No results for input(s): "LABPROT", "INR" in the last 72 hours. ABG    Component Value Date/Time   PHART 7.311 (L) 03/13/2023 1701   HCO3 20.3 03/13/2023 1701   TCO2 21 (L) 03/13/2023 1701   ACIDBASEDEF 5.0 (H)  03/13/2023 1701   O2SAT 95 03/13/2023 1701   CBG (last 3)  No results for input(s): "GLUCAP" in the last 72 hours.  Assessment/Plan: S/P Procedure(s) (LRB): CORONARY ARTERY BYPASS GRAFTING (CABG) X3, USING RIGHT LEG GREATER SAPHENOUS VEIN HARVESTED ENDOSCOPICALLY & LEFT IMA (N/A) TRANSESOPHAGEAL ECHOCARDIOGRAM (N/A)  Neuro: Intact. Sternal soreness improving.  CV: NSR with PVCs. HR 70s. SBP 112-146. Mostly elevated in 130s-140s. On Lopressor 12.5 mg BID. Titrate Lopressor.   Pulm: Saturating well on RA. No cough this AM. Last CXR without sign of pneumonia. Encourage IS and ambulation.   GI: Frequent/loose stools have improved, appetite is improving. Stool culture pending.   Renal: Cr 0.98. Foley removed yesterday. On Flomax. UO  750cc/24hrs recorded. Under preop weight. UA-hematuria and rae bacteria, urine culture with no growth. K 3.7, supplement.   ID: Hx of multiple myeloma. Tmax 98.4. Leukocytosis stable, WBC 11. Blood culture with no growth to date, urine culture with no growth. UA with rate bacteria. On empiric IV Unasyn. ? Transition to PO abx ?  Expected postop ABLA: H/H 8.3/25, trending down. Continue Iron supplement and monitor.   Thrombocytopenia: Trending down, Plt 94,000. On ASA 81mg . Lovenox held. Ambulation and SCDs for DVT prophylaxis.   Deconditioning: Continue work with PT. Home health recommended at discharge.  Dispo: Pending stool culture, loose stools improving. Transition to PO abx? Improve ambulation before d/c to home.    LOS: 11  days    Jenny Reichmann, PA-C 03/20/2023   Chart reviewed, patient examined, agree with above. He is still having some loose stool. Stool culture pending. Will discontinue mag oxide for now since that has given him loose stools before. He did not walk yet today except to bathroom. Unasyn switched to Augmentin. All cultures have been negative so far but he did improve on IV antibiotic with decrease in WBC ct and has not had any  more fever.

## 2023-03-20 NOTE — Progress Notes (Signed)
CARDIAC REHAB PHASE I    Pt resting in bed, feeling better today. However has just had another incontinent loose stool. Pt feels he is unable to get oob to chair or walk as he feels he may have more incontinence. Assisted Nurse tech with cleanup and skin care. Pt would love to get oob and walk if his loose bowels improve. Will return later today as time permits. Encouraged IS and ambulation when able. Will continue to follow.   [id number]   Woodroe Chen, RN BSN 03/20/2023 10:21 AM

## 2023-03-21 LAB — ECHO INTRAOPERATIVE TEE
AR max vel: 2.49 cm2
AV Area VTI: 2.47 cm2
AV Area mean vel: 2.53 cm2
AV Peak grad: 8.1 mmHg
Ao pk vel: 1.42 m/s
Height: 68 in
S' Lateral: 3.1 cm

## 2023-03-21 LAB — BASIC METABOLIC PANEL
Anion gap: 7 (ref 5–15)
BUN: 13 mg/dL (ref 8–23)
CO2: 23 mmol/L (ref 22–32)
Calcium: 7.7 mg/dL — ABNORMAL LOW (ref 8.9–10.3)
Chloride: 104 mmol/L (ref 98–111)
Creatinine, Ser: 0.95 mg/dL (ref 0.61–1.24)
GFR, Estimated: >60 mL/min (ref >60)
Glucose, Bld: 109 mg/dL — ABNORMAL HIGH (ref 70–99)
Potassium: 3.6 mmol/L (ref 3.5–5.1)
Sodium: 134 mmol/L — ABNORMAL LOW (ref 135–145)

## 2023-03-21 LAB — CBC
HCT: 26.5 % — ABNORMAL LOW (ref 39.0–52.0)
Hemoglobin: 8.7 g/dL — ABNORMAL LOW (ref 13.0–17.0)
MCH: 31.2 pg (ref 26.0–34.0)
MCHC: 32.8 g/dL (ref 30.0–36.0)
MCV: 95 fL (ref 80.0–100.0)
Platelets: 98 10*3/uL — ABNORMAL LOW (ref 150–400)
RBC: 2.79 MIL/uL — ABNORMAL LOW (ref 4.22–5.81)
RDW: 13.3 % (ref 11.5–15.5)
WBC: 4.7 10*3/uL (ref 4.0–10.5)
nRBC: 0 % (ref 0.0–0.2)

## 2023-03-21 LAB — STOOL CULTURE

## 2023-03-21 LAB — CULTURE, BLOOD (ROUTINE X 2): Culture: NO GROWTH

## 2023-03-21 MED ORDER — ACETAMINOPHEN 500 MG PO TABS
1000.0000 mg | ORAL_TABLET | Freq: Four times a day (QID) | ORAL | Status: DC | PRN
  Administered 2023-03-21 (×2): 1000 mg via ORAL
  Filled 2023-03-21 (×2): qty 2

## 2023-03-21 MED ORDER — POTASSIUM CHLORIDE CRYS ER 20 MEQ PO TBCR
40.0000 meq | EXTENDED_RELEASE_TABLET | Freq: Once | ORAL | Status: AC
  Administered 2023-03-21: 40 meq via ORAL
  Filled 2023-03-21: qty 2

## 2023-03-21 NOTE — Progress Notes (Addendum)
[address]       [place] [address]             [phone]      8 Days Post-Op Procedure(s) (LRB): CORONARY ARTERY BYPASS GRAFTING (CABG) X3, USING RIGHT LEG GREATER SAPHENOUS VEIN HARVESTED ENDOSCOPICALLY & LEFT IMA (N/A) TRANSESOPHAGEAL ECHOCARDIOGRAM (N/A) Subjective: Pt has no new complaints this AM. States loose stools are rare now, almost resolved. Walked to desk and back yesterday.   Objective: Vital signs in last 24 hours: Temp:  [98.2 F (36.8 C)-98.8 F (37.1 C)] 98.5 F (36.9 C) (05/21 0724) Pulse Rate:  [63-88] 74 (05/21 0724) Cardiac Rhythm: Normal sinus rhythm (05/20 1923) Resp:  [14-20] 20 (05/21 0724) BP: (114-135)/(52-58) 127/58 (05/21 0724) SpO2:  [94 %-100 %] 97 % (05/21 0724) Weight:  [89.7 kg] 89.7 kg (05/21 0456)  Hemodynamic parameters for last 24 hours:    Intake/Output from previous day: 05/20 0701 - 05/21 0700 In: 480 [P.O.:480] Out: -  Intake/Output this shift: No intake/output data recorded.  General appearance: alert, cooperative, and no distress Neurologic: intact Heart: regular rate and rhythm, S1, S2 normal, no murmur, click, rub or gallop Lungs: clear to auscultation bilaterally Abdomen: soft, non-tender; bowel sounds normal; no masses,  no organomegaly Extremities: extremities normal, atraumatic, no cyanosis or edema Wound: Clean and dry, no erythema   Lab Results: Recent Labs    03/19/23 0139 03/21/23 0147  WBC 11.0* 4.7  HGB 8.3* 8.7*  HCT 25.0* 26.5*  PLT 94* 98*   BMET:  Recent Labs    03/19/23 0139 03/21/23 0147  NA 133* 134*  K 3.7 3.6  CL 102 104  CO2 23 23  GLUCOSE 107* 109*  BUN 17 13  CREATININE 0.98 0.95  CALCIUM 7.7* 7.7*    PT/INR: No results for input(s): "LABPROT", "INR" in the last 72 hours. ABG    Component Value Date/Time   PHART 7.311 (L) 03/13/2023 1701   HCO3 20.3 03/13/2023 1701   TCO2 21 (L) 03/13/2023 1701   ACIDBASEDEF 5.0 (H) 03/13/2023 1701   O2SAT 95  03/13/2023 1701   CBG (last 3)  No results for input(s): "GLUCAP" in the last 72 hours.  Assessment/Plan: S/P Procedure(s) (LRB): CORONARY ARTERY BYPASS GRAFTING (CABG) X3, USING RIGHT LEG GREATER SAPHENOUS VEIN HARVESTED ENDOSCOPICALLY & LEFT IMA (N/A) TRANSESOPHAGEAL ECHOCARDIOGRAM (N/A)  Neuro: Intact. Sternal soreness improving.   CV: NSR. HR 70s. SBP 120s. On Lopressor 25mg  BID.   Pulm: Saturating well on RA. No cough this AM. Last CXR without sign of pneumonia. Encourage IS and ambulation.    GI: Frequent/loose stools have improved, almost resolved. Appetite is improving. Stool culture pending.    Renal: Cr 0.95. On Flomax. No UO recorded. Under preop weight. UA-hematuria and rare bacteria, urine culture with no growth. K 3.6, supplement.    ID: Hx of multiple myeloma. Tmax 98.8. Leukocytosis resolved, WBC 4.7. Blood culture with no growth to date, urine culture with no growth. UA with rate bacteria. Stool culture pending. IV Unasyn transitioned to PO Augmentin.    Expected postop ABLA: Improved, H/H 8.7/26.5. Continue Iron supplement and monitor.    Thrombocytopenia: Plt 98,000. On ASA 81mg . Lovenox held. Ambulation and SCDs for DVT prophylaxis.    Deconditioning: Continue work with PT. Home health recommended at discharge.   Dispo: Continue ambulation for deconditioning. Hopefully with increased ambulation and resolution of loose stools can d/c home next 24-48 hours.    LOS: 12 days  Jenny Reichmann, PA-C 03/21/2023   Chart reviewed, patient examined, agree with above. He feels better. If he has a good day today ambulating will plan to send home tomorrow.

## 2023-03-21 NOTE — Progress Notes (Signed)
Physical Therapy Treatment Patient Details Name: Bruce Smith MRN: [id number] DOB: February 15, 1951 Today's Date: 03/21/2023   History of Present Illness Pt is a 72 year old male admitted on 03/08/23 with chest pain. Pt underwent CABG x3 on 03/13/23. Past history of coronary disease and multiple PCI, multiple myeloma with prior stem cell transplant.    PT Comments    Pt received in chair, spouse present and encouraging, pt agreeable to therapy session with emphasis on sternal precaution instruction during scooting and transfers and during gait in hallway. Pt using RW during session for longer household distance gait trial but does use rollator at home, he would benefit from additional session in AM to ensure rollator remains a safe device for him given sternal precs. Reviewed post-CABG HEP handout and copy printed for him. Pt continues to benefit from PT services to progress toward functional mobility goals.    Recommendations for follow up therapy are one component of a multi-disciplinary discharge planning process, led by the attending physician.  Recommendations may be updated based on patient status, additional functional criteria and insurance authorization.  Follow Up Recommendations       Assistance Recommended at Discharge Frequent or constant Supervision/Assistance  Patient can return home with the following A little help with walking and/or transfers;A little help with bathing/dressing/bathroom;Assistance with cooking/housework;Direct supervision/assist for medications management;Assist for transportation;Help with stairs or ramp for entrance   Equipment Recommendations  None recommended by PT    Recommendations for Other Services       Precautions / Restrictions Precautions Precautions: Fall;Sternal Precaution Booklet Issued: Yes (comment) Precaution Comments: pt rec'd previously Restrictions Weight Bearing Restrictions: No     Mobility  Bed Mobility Overal bed mobility: Needs  Assistance             General bed mobility comments: pt rec'd in chair, wanting to stay up at end of session.    Transfers Overall transfer level: Needs assistance Equipment used: Rolling walker (2 wheels) Transfers: Sit to/from Stand Sit to Stand: Min assist           General transfer comment: from chair>RW, pt attempted without lift assist but was unable to achieve upright until provided with minA. Pt using momentum strategy and pushing from bil knees with min cues for Move in the Tube. Cues for improved technique to advance his hips prior to standing.    Ambulation/Gait Ambulation/Gait assistance: Supervision Gait Distance (Feet): 150 Feet Assistive device: Rolling walker (2 wheels) Gait Pattern/deviations: Decreased stride length, Step-through pattern Gait velocity: decreased     General Gait Details: Pt able to indicate when fatigued/needing to return to room; min cues for proximity to RW/Move in the Tube precs, spouse present able to provide min guard assist PRN but mostly Supervision level. No rollator on unit when PTA checked, pt uses 4WW at home so may be good to trial rollator next session to ensure compliance with sternal precs using that device.   Stairs Stairs:  (pt has ramp and plans to stay on single story)           Wheelchair Mobility    Modified Rankin (Stroke Patients Only)       Balance Overall balance assessment: Mild deficits observed, not formally tested                                          Cognition Arousal/Alertness: Awake/alert Behavior During  Therapy: WFL for tasks assessed/performed Overall Cognitive Status: Within Functional Limits for tasks assessed                                 General Comments: Able to verbalize sternal precs initially, but needs vcs for improved technique with various transfer/functional tasks. Receptive to instruction, pt enjoying to joke.        Exercises Other  Exercises Other Exercises: seated BLE AROM: ankle pumps x10 reps ea (also IS x1 rep)  pt given post-CABG HEP handout for seated/standing exercises and given safety cues for technique/to only perform standing therex when second person present to guard for safety.    General Comments General comments (skin integrity, edema, etc.): Pt pulling 2250 on IS, encouraged hourly use      Pertinent Vitals/Pain Pain Assessment Pain Assessment: 0-10 Pain Score: 2  Pain Location: sternal incision Pain Descriptors / Indicators: Discomfort, Sore Pain Intervention(s): Monitored during session, Repositioned    Home Living                          Prior Function            PT Goals (current goals can now be found in the care plan section) Acute Rehab PT Goals Patient Stated Goal: to go home PT Goal Formulation: With patient Time For Goal Achievement: 04/02/23 Progress towards PT goals: Progressing toward goals    Frequency    Min 1X/week      PT Plan Current plan remains appropriate    Co-evaluation              AM-PAC PT "6 Clicks" Mobility   Outcome Measure  Help needed turning from your back to your side while in a flat bed without using bedrails?: A Little Help needed moving from lying on your back to sitting on the side of a flat bed without using bedrails?: A Little Help needed moving to and from a bed to a chair (including a wheelchair)?: A Little Help needed standing up from a chair using your arms (e.g., wheelchair or bedside chair)?: A Little Help needed to walk in hospital room?: A Little Help needed climbing 3-5 steps with a railing? : A Lot 6 Click Score: 17    End of Session Equipment Utilized During Treatment: Gait belt Activity Tolerance: Patient tolerated treatment well Patient left: with call bell/phone within reach;with family/visitor present;in chair (pt wanting to keep feet down) Nurse Communication: Mobility status PT Visit Diagnosis: Other  abnormalities of gait and mobility (R26.89)     Time: 1610-9604 PT Time Calculation (min) (ACUTE ONLY): 17 min  Charges:  $Therapeutic Activity: 8-22 mins                     Bruce Smith P., PTA Acute Rehabilitation Services Secure Chat Preferred 9a-5:30pm Office: [phone]    Angus Palms 03/21/2023, 4:35 PM

## 2023-03-22 ENCOUNTER — Inpatient Hospital Stay: Payer: Medicare Other

## 2023-03-22 ENCOUNTER — Other Ambulatory Visit: Payer: Self-pay

## 2023-03-22 ENCOUNTER — Ambulatory Visit: Payer: Medicare Other | Admitting: Hematology & Oncology

## 2023-03-22 ENCOUNTER — Ambulatory Visit: Payer: Medicare Other

## 2023-03-22 ENCOUNTER — Telehealth (HOSPITAL_COMMUNITY): Payer: Self-pay

## 2023-03-22 LAB — CBC
HCT: 27.5 % — ABNORMAL LOW (ref 39.0–52.0)
Hemoglobin: 9.1 g/dL — ABNORMAL LOW (ref 13.0–17.0)
MCH: 31.1 pg (ref 26.0–34.0)
MCHC: 33.1 g/dL (ref 30.0–36.0)
MCV: 93.9 fL (ref 80.0–100.0)
Platelets: 89 10*3/uL — ABNORMAL LOW (ref 150–400)
RBC: 2.93 MIL/uL — ABNORMAL LOW (ref 4.22–5.81)
RDW: 13.2 % (ref 11.5–15.5)
WBC: 3.8 10*3/uL — ABNORMAL LOW (ref 4.0–10.5)
nRBC: 0 % (ref 0.0–0.2)

## 2023-03-22 LAB — BASIC METABOLIC PANEL
Anion gap: 5 (ref 5–15)
BUN: 12 mg/dL (ref 8–23)
CO2: 24 mmol/L (ref 22–32)
Calcium: 7.9 mg/dL — ABNORMAL LOW (ref 8.9–10.3)
Chloride: 105 mmol/L (ref 98–111)
Creatinine, Ser: 0.87 mg/dL (ref 0.61–1.24)
GFR, Estimated: >60 mL/min (ref >60)
Glucose, Bld: 111 mg/dL — ABNORMAL HIGH (ref 70–99)
Potassium: 4 mmol/L (ref 3.5–5.1)
Sodium: 134 mmol/L — ABNORMAL LOW (ref 135–145)

## 2023-03-22 LAB — CULTURE, BLOOD (ROUTINE X 2)

## 2023-03-22 MED ORDER — FE FUM-VIT C-VIT B12-FA 460-60-0.01-1 MG PO CAPS: 1.0000 | ORAL_CAPSULE | Freq: Every day | ORAL | 0 refills | Status: DC

## 2023-03-22 MED ORDER — ROSUVASTATIN CALCIUM 40 MG PO TABS: 40.0000 mg | ORAL_TABLET | Freq: Every day | ORAL | 1 refills | Status: DC

## 2023-03-22 MED ORDER — AMOXICILLIN-POT CLAVULANATE 875-125 MG PO TABS: 1.0000 | ORAL_TABLET | Freq: Two times a day (BID) | ORAL | 0 refills | Status: DC

## 2023-03-22 MED ORDER — METOPROLOL TARTRATE 25 MG PO TABS: 25.0000 mg | ORAL_TABLET | Freq: Two times a day (BID) | ORAL | 1 refills | Status: DC

## 2023-03-22 MED FILL — Electrolyte-R (PH 7.4) Solution: INTRAVENOUS | Qty: 4000 | Status: AC

## 2023-03-22 MED FILL — Lidocaine HCl Local Soln Prefilled Syringe 100 MG/5ML (2%): INTRAMUSCULAR | Qty: 5 | Status: AC

## 2023-03-22 MED FILL — Sodium Chloride IV Soln 0.9%: INTRAVENOUS | Qty: 2000 | Status: AC

## 2023-03-22 MED FILL — Mannitol IV Soln 20%: INTRAVENOUS | Qty: 500 | Status: AC

## 2023-03-22 NOTE — Progress Notes (Addendum)
[address]       [place] [address]             [phone]      9 Days Post-Op Procedure(s) (LRB): CORONARY ARTERY BYPASS GRAFTING (CABG) X3, USING RIGHT LEG GREATER SAPHENOUS VEIN HARVESTED ENDOSCOPICALLY & LEFT IMA (N/A) TRANSESOPHAGEAL ECHOCARDIOGRAM (N/A) Subjective: Pt has no new complaints this AM, diarrhea has resolved. Walked 3 times yesterday.  Objective: Vital signs in last 24 hours: Temp:  [97.7 F (36.5 C)-98.5 F (36.9 C)] 97.7 F (36.5 C) (05/22 0357) Pulse Rate:  [60-74] 64 (05/22 0357) Cardiac Rhythm: Normal sinus rhythm (05/21 1902) Resp:  [15-20] 15 (05/22 0357) BP: (108-127)/(45-63) 111/59 (05/22 0357) SpO2:  [95 %-98 %] 98 % (05/22 0357) Weight:  [89.1 kg] 89.1 kg (05/22 0357)  Hemodynamic parameters for last 24 hours:    Intake/Output from previous day: 05/21 0701 - 05/22 0700 In: 180 [P.O.:180] Out: 0  Intake/Output this shift: No intake/output data recorded.  General appearance: alert, cooperative, and no distress Neurologic: intact Heart: regular rate and rhythm, S1, S2 normal, no murmur, click, rub or gallop Lungs: clear to auscultation bilaterally Abdomen: soft, non-tender; bowel sounds normal; no masses,  no organomegaly Extremities: extremities normal, atraumatic, no cyanosis or edema Wound: Clean and dry, no erythema or sign of infection  Lab Results: Recent Labs    03/21/23 0147 03/22/23 0147  WBC 4.7 3.8*  HGB 8.7* 9.1*  HCT 26.5* 27.5*  PLT 98* 89*   BMET:  Recent Labs    03/21/23 0147 03/22/23 0147  NA 134* 134*  K 3.6 4.0  CL 104 105  CO2 23 24  GLUCOSE 109* 111*  BUN 13 12  CREATININE 0.95 0.87  CALCIUM 7.7* 7.9*    PT/INR: No results for input(s): "LABPROT", "INR" in the last 72 hours. ABG    Component Value Date/Time   PHART 7.311 (L) 03/13/2023 1701   HCO3 20.3 03/13/2023 1701   TCO2 21 (L) 03/13/2023 1701   ACIDBASEDEF 5.0 (H) 03/13/2023 1701   O2SAT 95 03/13/2023 1701   CBG  (last 3)  No results for input(s): "GLUCAP" in the last 72 hours.  Assessment/Plan: S/P Procedure(s) (LRB): CORONARY ARTERY BYPASS GRAFTING (CABG) X3, USING RIGHT LEG GREATER SAPHENOUS VEIN HARVESTED ENDOSCOPICALLY & LEFT IMA (N/A) TRANSESOPHAGEAL ECHOCARDIOGRAM (N/A)  Neuro: Intact. Sternal soreness improved.   CV: NSR, some PVCs. HR 60s. SBP 111. Continue Lopressor 25mg  BID.   Pulm: Saturating well on RA. Last CXR without sign of pneumonia. Encourage IS and ambulation.    GI: Diarrhea resolved, Appetite improved. Stool culture pending.    Renal: Cr 0.87. On Flomax. No UO recorded, pt states he is urinating regularly. Under preop weight. UA-hematuria and rare bacteria, urine culture with no growth.    ID: Hx of multiple myeloma. Afebrile, leukocytosis resolved. Blood culture with no growth to date, urine culture with no growth. UA with rate bacteria. Stool culture Ecoli and Shigatoxin negative, others still pending. On PO Augmentin.    Expected postop ABLA: Improved, H/H 9.1/27.5. Continue Iron supplement.   Thrombocytopenia: Plt 89,000. On ASA 81mg . Holding Plavix for NSTEMI. Ambulation and SCDs for DVT prophylaxis.    Deconditioning: Continue work with PT. Home health recommended at discharge.   Dispo: D/C to home today.    LOS: 13 days    Jenny Reichmann, PA-C 03/22/2023   Chart reviewed, patient examined, agree with above. He looks good overall and wants to go home.  He has a sore butt from the loose stools and laying on butt. He would like to take some of Gerhardt's butt cream home which he says works well to relieve the discomfort.

## 2023-03-22 NOTE — Telephone Encounter (Signed)
Per Phase 1 Cardiac Rehab fax referral to [facility]

## 2023-03-22 NOTE — TOC Transition Note (Signed)
Transition of Care (TOC) - CM/SW Discharge Note Donn Pierini RN, BSN Transitions of Care Unit 4E- RN Case Manager See Treatment Team for direct phone #   Patient Details  Name: Bruce Smith MRN: [id number] Date of Birth: 1951-07-26  Transition of Care [facility]) CM/SW Contact:  Darrold Span, RN Phone Number: 03/22/2023, 4:28 PM   Clinical Narrative:    Pt stable for transition home, HHPT arranged with [facility] with preop TCTS office referral- liaison notified for start of care.   No further TOC needs, family to transport home.    Final next level of care: Home w Home Health Services Barriers to Discharge: Barriers Resolved   Patient Goals and CMS Choice   Choice offered to / list presented to : NA  Discharge Placement                 Home w/ [facility]        Discharge Plan and Services Additional resources added to the After Visit Summary for     Discharge Planning Services: CM Consult              DME Agency: NA       HH Arranged: PT HH Agency: [facility] Date [facility] Agency Contacted: 03/22/23   Representative spoke with at [facility] Agency: Bjorn Loser  Social Determinants of Health (SDOH) Interventions SDOH Screenings   Food Insecurity: No Food Insecurity (03/09/2023)  Housing: Low Risk  (03/09/2023)  Transportation Needs: No Transportation Needs (03/09/2023)  Utilities: Not At Risk (03/09/2023)  Financial Resource Strain: Low Risk  (10/05/2022)  Tobacco Use: High Risk (03/14/2023)     Readmission Risk Interventions     No data to display

## 2023-03-22 NOTE — Progress Notes (Signed)
CARDIAC REHAB PHASE I   PRE:  Rate/Rhythm: 77 SR with PVCs    BP: sitting 116/45    SpO2: 93 RA  MODE:  Ambulation: 160 ft   POST:  Rate/Rhythm: 102 ST    BP: sitting 122/58     SpO2: 96 RA  Pt with incorrect mechanics getting out of bed therefore needed mod assist to get to sitting position from his back. Discussed correctly rolling to his side before dropping feet. Pt able to stand with min assist. Ambulated with rollator, slow and steady, standby assist, good control. C/o hip pain with distance as well as SOB, increased distance today. Return to EOB.  Discussed with pt and wife IS, sternal precautions, exercise, diet, and CRPII. Will refer to [facility].   1610-9604  Ethelda Chick BS, ACSM-CEP 03/22/2023 9:05 AM

## 2023-03-23 LAB — STOOL CULTURE REFLEX - RSASHR

## 2023-03-23 LAB — STOOL CULTURE: E coli, Shiga toxin Assay: NEGATIVE

## 2023-03-23 LAB — STOOL CULTURE REFLEX - CMPCXR

## 2023-03-30 ENCOUNTER — Other Ambulatory Visit: Payer: Self-pay | Admitting: Hematology & Oncology

## 2023-04-02 NOTE — Progress Notes (Unsigned)
Office Visit    Patient Name: Bruce Smith Date of Encounter: 04/03/2023  PCP:  Elijio Miles., MD   [facility]  Cardiologist:  Lance Muss, MD  Advanced Practice Provider:  No care team member to display Electrophysiologist:  None   HPI    Bruce Smith is a 72 y.o. male with a past medical history of hypertension, hyperlipidemia, multiple myeloma with prior stem cell transplant and recurrence treated with chemotherapy felt to be in remission, CAD status post multiple prior PCI's to the LAD, OM, and PDA.  Presents today for hospital follow-up.  He had reported being in his usual state of health until the day of admission where he developed pain in his chest with radiation to neck, jaw, and arms.  Troponin was 1279, increased to 7465.  EKG showed nonspecific changes.  Chest pain resolved and has not reoccurred.  Cardiac cath showed severe three-vessel CAD with LVEF 50 to 55% and mildly elevated LVEDP of 17 mmHg.  Echo showed EF 55 to 60% with no valvular abnormality.  Dr. Laneta Simmers had discussed the need for coronary bypass grafting which was performed on 5/13.  He underwent CABG x 3 utilizing LIMA to LAD, SVG to OM1, SVG to OM 2.  He tolerated the procedure well.  Plavix and ACS was not started due to history of multiple myeloma and thrombocytopenia.  PT consult was obtained for weakness.  Home health at discharge.  He developed a UA and was placed on antibiotics.  Today, he tells me that he has been feeling well without any chest pains.  No significant shortness of breath.  Occasionally does have a sharp pain in his chest that is likely nerve related.  He was not started on Plavix in the hospital because his low platelets.  We recommended follow-up CBC in a couple months to recheck.  He has a strong cardiac history including his and dying suddenly in her 30s.  Brother died at age 86.  He tries to stay busy but does not do any formal exercise.  Cardiac history  includes stents in 2000, 2013, and 2017.  Now with recent CABG surgery this year.  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  Wanted to try a supplement called Metanx.  We reviewed this with pharmacy and it looks like the ingredients are all vitamins.  Should be safe to take.  Reports no shortness of breath nor dyspnea on exertion. Reports no chest pain, pressure, or tightness. No edema, orthopnea, PND. Reports no palpitations.   Past Medical History    Past Medical History:  Diagnosis Date   Atherosclerosis    Coronary artery disease    Goals of care, counseling/discussion 06/29/2020   History of radiation therapy    Left Humerus, T-spine, Pelvis- 09/19/22-10/04/22- Dr. Antony Blackbird   History of radiation therapy    Right Femur- 01/15/23-01/16/23- Dr. 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   Multiple myeloma (HCC) 07/21/2020   Myocardial infarction [facility])    Past Surgical History:  Procedure Laterality Date   ANTERIOR CERVICAL DECOMP/DISCECTOMY FUSION N/A 06/17/2020   Procedure: ANTERIOR CERVICAL DECOMPRESSION/DISCECTOMY FUSION, INTERBODY PROSTHESIS, PLATE/SCREWS CERVICAL FIVE- CERVICAL SIX;  Surgeon: Tressie Stalker, MD;  Location: [facility] OR;  Service: Neurosurgery;  Laterality: N/A;  ANTERIOR CERVICAL DECOMPRESSION/DISCECTOMY FUSION, INTERBODY PROSTHESIS, PLATE/SCREWS CERVICAL FIVE- CERVICAL SIX   BIOPSY  02/24/2021  Procedure: BIOPSY;  Surgeon: Kathi Der, MD;  Location: [facility] ENDOSCOPY;  Service: Gastroenterology;;   CARDIAC CATHETERIZATION     CORONARY ARTERY BYPASS GRAFT N/A 03/13/2023   Procedure: CORONARY ARTERY BYPASS GRAFTING (CABG) X3, USING RIGHT LEG GREATER SAPHENOUS VEIN HARVESTED ENDOSCOPICALLY & LEFT IMA;  Surgeon: Alleen Borne, MD;  Location: [facility] OR;  Service: Open Heart Surgery;  Laterality: N/A;   ESOPHAGOGASTRODUODENOSCOPY N/A 02/24/2021    Procedure: ESOPHAGOGASTRODUODENOSCOPY (EGD);  Surgeon: Kathi Der, MD;  Location: [facility];  Service: Gastroenterology;  Laterality: N/A;   FEMUR IM NAIL Left 07/14/2020   Procedure: OPEN REDUCTION INTERNAL FIXATION (ORIF LEFT FEMORAL NAIL FRACTURE;  Surgeon: Teryl Lucy, MD;  Location: [facility] OR;  Service: Orthopedics;  Laterality: Left;   LEFT HEART CATH AND CORONARY ANGIOGRAPHY N/A 03/09/2023   Procedure: LEFT HEART CATH AND CORONARY ANGIOGRAPHY;  Surgeon: Runell Gess, MD;  Location: [facility] INVASIVE CV LAB;  Service: Cardiovascular;  Laterality: N/A;   TEE WITHOUT CARDIOVERSION N/A 03/13/2023   Procedure: TRANSESOPHAGEAL ECHOCARDIOGRAM;  Surgeon: Alleen Borne, MD;  Location: [facility] OR;  Service: Open Heart Surgery;  Laterality: N/A;    Allergies  No Known Allergies   EKGs/Labs/Other Studies Reviewed:   The following studies were reviewed today: Cardiac Studies & Procedures   CARDIAC CATHETERIZATION  CARDIAC CATHETERIZATION 03/09/2023  Narrative Images from the original result were not included.    Ost LM to Mid LM lesion is 60% stenosed.   Mid LAD lesion is 90% stenosed.   Ost Cx lesion is 60% stenosed.   1st Mrg lesion is 90% stenosed.   Mid Cx to Dist Cx lesion is 90% stenosed.   3rd Mrg lesion is 80% stenosed.   Previously placed Prox RCA to Mid RCA stent of unknown type is  widely patent.   The left ventricular systolic function is normal.   LV end diastolic pressure is mildly elevated.   The left ventricular ejection fraction is 50-55% by visual estimate.  Bruce Smith is a 72 y.o. male   [id number] LOCATION:  FACILITY: [facility] PHYSICIAN: [name], M.D. 1950/12/05   DATE OF PROCEDURE:  03/09/2023  DATE OF DISCHARGE:     CARDIAC CATHETERIZATION    History obtained from chart review.72 y.o. male with PMH of CAD s/p prior RCA, LAD and PDA stents, HTN, HLD, multiple myeloma with prior stem cell transplant who presented with chest pain and elevated  troponins.  Impression Mr. Halaby presented with a non-STEMI with troponins in the 7000 range.  He status post LAD, RCA and distal circumflex stenting remotely.  His cath revealed a moderate distal left main with diffuse in-stent restenosis within the mid LAD stent as well as disease in the first marginal branch, second marginal branch in the AV groove circumflex.  The distal stent in the PLA branch is occluded.  The stent in the right right right coronary artery is widely patent.  His anatomy is suitable for CABG.  The guidewire and catheter were removed.  The sheath was removed and a TR band was placed on the right wrist to achieve patent hemostasis.  The patient left lab in stable condition.  He will be started back on heparin 4 hours after sheath removal without bolus.  T CTS will be consulted.  The angiograms were reviewed with Dr. Excell Seltzer as well.  [name]. MD, [facility] 03/09/2023 2:46 PM  Findings Coronary Findings Diagnostic  Dominance: Co-dominant  Left Main Ost LM to Mid LM lesion is 60% stenosed.  Left Anterior  Descending Mid LAD lesion is 90% stenosed. Vessel is the culprit lesion. The lesion was previously treated .  Left Circumflex Ost Cx lesion is 60% stenosed. Mid Cx to Dist Cx lesion is 90% stenosed.  First Obtuse Marginal Branch 1st Mrg lesion is 90% stenosed.  Third Obtuse Marginal Branch 3rd Mrg lesion is 80% stenosed.  Right Coronary Artery Previously placed Prox RCA to Mid RCA stent of unknown type is  widely patent.  Intervention  No interventions have been documented.     ECHOCARDIOGRAM  ECHOCARDIOGRAM COMPLETE 03/09/2023  Narrative ECHOCARDIOGRAM REPORT    Patient Name:   Sylvanus Hullum Date of Exam: 03/09/2023 Medical Rec #:  [id number]    Height:       68.0 in Accession #:    [id number]   Weight:       199.7 lb Date of Birth:  [birth year]     BSA:          2.043 m Patient Age:    71 years     BP:           142/61 mmHg Patient Gender: M             HR:           57 bpm. Exam Location:  Inpatient  Procedure: 2D Echo, Cardiac Doppler, Color Doppler and Intracardiac Opacification Agent  Indications:    122-I22.9 Subsequent ST elevation (STEM) and non-ST elevation (NSTEMI) myocardial infarction  History:        Patient has prior history of Echocardiogram examinations, most recent 10/30/2020. Acute MI and CAD; Risk Factors:Former Smoker and Dyslipidemia. Metastatic disease.  Sonographer:    Sheralyn Boatman RDCS Referring Phys: Maurine Minister NARCISSE JR  IMPRESSIONS   1. Left ventricular ejection fraction, by estimation, is 55 to 60%. The left ventricle has normal function. The left ventricle demonstrates regional wall motion abnormalities (see scoring diagram/findings for description). There is mild left ventricular hypertrophy. Left ventricular diastolic parameters were grossly normal. 2. Right ventricular systolic function is normal. The right ventricular size is normal. There is normal pulmonary artery systolic pressure. The estimated right ventricular systolic pressure is 20.1 mmHg. 3. The mitral valve is grossly normal. Trivial mitral valve regurgitation. No evidence of mitral stenosis. 4. The aortic valve is tricuspid. There is mild calcification of the aortic valve. Aortic valve regurgitation is not visualized. No aortic stenosis is present. 5. The inferior vena cava is normal in size with greater than 50% respiratory variability, suggesting right atrial pressure of 3 mmHg.  FINDINGS Left Ventricle: Left ventricular ejection fraction, by estimation, is 55 to 60%. The left ventricle has normal function. The left ventricle demonstrates regional wall motion abnormalities. Definity contrast agent was given IV to delineate the left ventricular endocardial borders. The left ventricular internal cavity size was normal in size. There is mild left ventricular hypertrophy. Left ventricular diastolic parameters were normal.   LV Wall Scoring: The  apical lateral segment, apical septal segment, and apical anterior segment are hypokinetic.  Right Ventricle: The right ventricular size is normal. No increase in right ventricular wall thickness. Right ventricular systolic function is normal. There is normal pulmonary artery systolic pressure. The tricuspid regurgitant velocity is 2.07 m/s, and with an assumed right atrial pressure of 3 mmHg, the estimated right ventricular systolic pressure is 20.1 mmHg.  Left Atrium: Left atrial size was normal in size.  Right Atrium: Right atrial size was normal in size.  Pericardium: Trivial pericardial effusion is present.  Mitral Valve: The  mitral valve is grossly normal. Trivial mitral valve regurgitation. No evidence of mitral valve stenosis.  Tricuspid Valve: The tricuspid valve is normal in structure. Tricuspid valve regurgitation is mild . No evidence of tricuspid stenosis.  Aortic Valve: The aortic valve is tricuspid. There is mild calcification of the aortic valve. Aortic valve regurgitation is not visualized. No aortic stenosis is present.  Pulmonic Valve: The pulmonic valve was normal in structure. Pulmonic valve regurgitation is trivial. No evidence of pulmonic stenosis.  Aorta: The aortic root is normal in size and structure.  Venous: The inferior vena cava is normal in size with greater than 50% respiratory variability, suggesting right atrial pressure of 3 mmHg.  IAS/Shunts: No atrial level shunt detected by color flow Doppler.   LEFT VENTRICLE PLAX 2D LVIDd:         5.05 cm      Diastology LVIDs:         3.00 cm      LV e' medial:    6.85 cm/s LV PW:         0.95 cm      LV E/e' medial:  11.8 LV IVS:        1.10 cm      LV e' lateral:   8.75 cm/s LVOT diam:     2.30 cm      LV E/e' lateral: 9.2 LV SV:         99 LV SV Index:   48 LVOT Area:     4.15 cm  LV Volumes (MOD) LV vol d, MOD A2C: 150.0 ml LV vol d, MOD A4C: 151.0 ml LV vol s, MOD A2C: 70.5 ml LV vol s, MOD A4C:  57.8 ml LV SV MOD A2C:     79.5 ml LV SV MOD A4C:     151.0 ml LV SV MOD BP:      89.5 ml  RIGHT VENTRICLE             IVC RV S prime:     11.30 cm/s  IVC diam: 2.10 cm TAPSE (M-mode): 2.0 cm  LEFT ATRIUM             Index        RIGHT ATRIUM          Index LA diam:        2.80 cm 1.37 cm/m   RA Area:     8.65 cm LA Vol (A2C):   30.0 ml 14.69 ml/m  RA Volume:   14.20 ml 6.95 ml/m LA Vol (A4C):   24.6 ml 12.04 ml/m LA Biplane Vol: 29.8 ml 14.59 ml/m AORTIC VALVE             PULMONIC VALVE LVOT Vmax:   103.00 cm/s PR End Diast Vel: 1.47 msec LVOT Vmean:  63.400 cm/s LVOT VTI:    0.238 m  AORTA Ao Root diam: 3.50 cm Ao Asc diam:  3.20 cm  MITRAL VALVE               TRICUSPID VALVE MV Area (PHT): 2.45 cm    TR Peak grad:   17.1 mmHg MV Decel Time: 310 msec    TR Vmax:        207.00 cm/s MV E velocity: 80.70 cm/s MV A velocity: 73.40 cm/s  SHUNTS MV E/A ratio:  1.10        Systemic VTI:  0.24 m Systemic Diam: 2.30 cm  Weston Brass MD Electronically signed by Weston Brass  MD Signature Date/Time: 03/09/2023/2:52:27 PM    Final   TEE  ECHO INTRAOPERATIVE TEE 03/21/2023  Narrative *INTRAOPERATIVE TRANSESOPHAGEAL REPORT *    Patient Name:   Keli Loy   Date of Exam: 03/09/2023 Medical Rec #:  [id number]      Height:       68.0 in Accession #:    [id number]     Weight:       199.0 lb Date of Birth:  03/20/1951       BSA:          2.04 m Patient Age:    71 years       BP:           108/57 mmHg Patient Gender: M              HR:           52 bpm. Exam Location:  Anesthesiology  Transesophogeal exam was perform intraoperatively during surgical procedure. Patient was closely monitored under general anesthesia during the entirety of examination.  Indications:     Coronary artery disease Native Vessel i25.10; Sonographer:     Dondra Prader RVT Performing Phys: [id number] BARTLE Diagnosing Phys: Hester Mates  PROCEDURE: Intraoperative Transesophogeal Date  of procedure 03/13/2023. Complications: No known complications during this procedure. POST-OP IMPRESSIONS _ Left Ventricle: The left ventricle is unchanged from pre-bypass. _ Right Ventricle: The right ventricle appears unchanged from pre-bypass. _ Aorta: The aorta appears unchanged from pre-bypass. _ Left Atrial Appendage: The left atrial appendage appears unchanged from pre-bypass. _ Aortic Valve: The aortic valve appears unchanged from pre-bypass. _ Mitral Valve: The mitral valve appears unchanged from pre-bypass. _ Tricuspid Valve: The tricuspid valve appears unchanged from pre-bypass. _ Pulmonic Valve: The pulmonic valve appears unchanged from pre-bypass. _ Interatrial Septum: The interatrial septum appears unchanged from pre-bypass. _ Pericardium: The pericardium appears unchanged from pre-bypass. _ Comments: S/P CABG X 3. No new or worsening wall motion or valvular abnormalities.  PRE-OP FINDINGS Left Ventricle: The left ventricle has normal systolic function, with an ejection fraction of 60-65%. The cavity size was normal. Left ventricular diastolic parameters were normal.   Right Ventricle: The right ventricle has normal systolic function. The cavity was normal. There is no increase in right ventricular wall thickness. Right ventricular systolic pressure is normal.  Left Atrium: Left atrial size was normal in size. No left atrial/left atrial appendage thrombus was detected. There is echo contrast seen in the left atrial cavity and left atrial appendage. Left atrial appendage velocity is normal at greater than 40 cm/s.  Right Atrium: Right atrial size was normal in size.  Interatrial Septum: No atrial level shunt detected by color flow Doppler. The interatrial septum appears to be lipomatous. There is no evidence of a patent foramen ovale.  Pericardium: There is no evidence of pericardial effusion. There is no pleural effusion.  Mitral Valve: The mitral valve is normal in  structure. Mitral valve regurgitation is trivial by color flow Doppler. There is no evidence of mitral valve vegetation. There is No evidence of mitral stenosis.  Tricuspid Valve: The tricuspid valve was normal in structure. Tricuspid valve regurgitation is trivial by color flow Doppler. No evidence of tricuspid stenosis is present. There is no evidence of tricuspid valve vegetation.  Aortic Valve: The aortic valve is tricuspid Aortic valve regurgitation was not visualized by color flow Doppler. There is no stenosis of the aortic valve, with a calculated valve area of 2.47 cm. There is no  evidence of aortic valve vegetation.   Pulmonic Valve: The pulmonic valve was normal in structure, with normal. No evidence of pumonic stenosis. Pulmonic valve regurgitation is mild by color flow Doppler.   Aorta: The aortic root and ascending aorta are normal in size and structure.  Pulmonary Artery: Theone Murdoch catheter present on the right. The pulmonary artery is of normal size.  Shunts: There is no evidence of an atrial septal defect.  +--------------+--------++ LEFT VENTRICLE          +----------------+---------++ +--------------+--------++  Diastology                PLAX 2D                 +----------------+---------++ +--------------+--------++  LV e' lateral:  7.20 cm/s LVIDd:        4.60 cm   +----------------+---------++ +--------------+--------++  LV E/e' lateral:9.2       LVIDs:        3.10 cm   +----------------+---------++ +--------------+--------++  LV e' medial:   5.86 cm/s LVOT diam:    2.50 cm   +----------------+---------++ +--------------+--------++  LV E/e' medial: 11.4      LV SV:        59 ml     +----------------+---------++ +--------------+--------++ LV SV Index:  28.21    +--------------+--------++  +------------+--------++ LVOT Area:    4.91 cm  3D Volume EF         +--------------+--------++   +------------+--------++                         LV 3D EDV:  82.75 ml +--------------+--------++  +------------+--------++ LV 3D ESV:  28.64 ml +------------+--------++  +------------------+-----------++ AORTIC VALVE                  +------------------+-----------++ AV Area (Vmax):   2.49 cm    +------------------+-----------++ AV Area (Vmean):  2.53 cm    +------------------+-----------++ AV Area (VTI):    2.47 cm    +------------------+-----------++ AV Vmax:          142.00 cm/s +------------------+-----------++ AV Vmean:         95.000 cm/s +------------------+-----------++ AV VTI:           0.374 m     +------------------+-----------++ AV Peak Grad:     8.1 mmHg    +------------------+-----------++ LVOT Vmax:        71.90 cm/s  +------------------+-----------++ LVOT Vmean:       49.000 cm/s +------------------+-----------++ LVOT VTI:         0.188 m     +------------------+-----------++ LVOT/AV VTI ratio:0.50        +------------------+-----------++  +-------------+-------++ AORTA                +-------------+-------++ Ao Root diam:3.20 cm +-------------+-------++ Ao STJ diam: 2.7 cm  +-------------+-------++ Ao Desc diam:2.90 cm +-------------+-------++  +-------------+---------++ MITRAL VALVE             +--------------+-------+ +-------------+---------++   SHUNTS                MV Peak grad:1.8 mmHg    +--------------+-------+ +-------------+---------++   Systemic VTI: 0.19 m  MV Mean grad:1.0 mmHg    +--------------+-------+ +-------------+---------++   Systemic Diam:2.50 cm MV Vmax:     0.67 m/s    +--------------+-------+ +-------------+---------++ MV Vmean:    34.5 cm/s +-------------+---------++ +--------------+----------++ MV E velocity:66.60 cm/s +--------------+----------++ MV A velocity:57.60  cm/s +--------------+----------++ MV E/A ratio: 1.16       +--------------+----------++  Hester Mates Electronically signed by Hester Mates Signature Date/Time: 03/21/2023/7:18:41 AM    Final             EKG:  EKG is not ordered today.    Recent Labs: 02/22/2023: ALT 19 03/09/2023: B Natriuretic Peptide 123.9; TSH 2.847 03/15/2023: Magnesium 2.1 03/22/2023: BUN 12; Creatinine, Ser 0.87; Hemoglobin 9.1; Platelets 89; Potassium 4.0; Sodium 134  Recent Lipid Panel    Component Value Date/Time   CHOL 173 03/09/2023 0304   TRIG 168 (H) 03/09/2023 0304   HDL 41 03/09/2023 0304   CHOLHDL 4.2 03/09/2023 0304   VLDL 34 03/09/2023 0304   LDLCALC 98 03/09/2023 0304    Home Medications   Current Meds  Medication Sig   ACETAMINOPHEN ER PO Take 1,000 mg by mouth in the morning, at noon, in the evening, and at bedtime.   aspirin EC 81 MG tablet Take 81 mg by mouth at bedtime.   calcium carbonate (OS-CAL - DOSED IN MG OF ELEMENTAL CALCIUM) 1250 (500 Ca) MG tablet Take 1 tablet (500 mg of elemental calcium total) by mouth 2 (two) times daily with a meal.   Fe Fum-Vit C-Vit B12-FA (TRIGELS-F FORTE) CAPS capsule Take 1 capsule by mouth daily after breakfast.   furosemide (LASIX) 20 MG tablet Take 1 tablet by mouth daily for 3 days, then take daily as needed thereafter for lower extremity edema   levETIRAcetam (KEPPRA) 500 MG tablet TAKE 1 TABLET BY MOUTH 2 TIMES A DAY   loperamide (IMODIUM) 2 MG capsule Take 1 capsule (2 mg total) by mouth as needed for diarrhea or loose stools.   MAGNESIUM PO Take 100 mg by mouth daily.   metoprolol tartrate (LOPRESSOR) 25 MG tablet Take 1 tablet (25 mg total) by mouth 2 (two) times daily.   ondansetron (ZOFRAN) 8 MG tablet TAKE 1 TABLET BY MOUTH EVERY 8 HOURS AS NEEDED NAUSEA AND VOMITING (Patient taking differently: Take 8 mg by mouth every 8 (eight) hours as needed for nausea or vomiting.)   pantoprazole (PROTONIX) 40 MG tablet TAKE 1  TABLET BY MOUTH 2 TIMES A DAY BEFORE MEALS FOR ACID REFLUX (Patient taking differently: Take 40 mg by mouth 2 (two) times daily before a meal.)   polyethylene glycol powder (GLYCOLAX/MIRALAX) 17 GM/SCOOP powder Mix 17g (1 capful) with 8 oz of favorite beverage and drink every morning   pregabalin (LYRICA) 200 MG capsule TAKE 1 CAPSULE BY MOUTH 2 TIMES DAILY   prochlorperazine (COMPAZINE) 10 MG tablet Take 1 tablet (10 mg total) by mouth every 6 (six) hours as needed for nausea or vomiting.   rosuvastatin (CRESTOR) 40 MG tablet Take 1 tablet (40 mg total) by mouth daily.   saccharomyces boulardii (FLORASTOR) 250 MG capsule Take 250 mg by mouth daily.   tamsulosin (FLOMAX) 0.4 MG CAPS capsule TAKE 1 CAPSULE BY MOUTH EVERY NIGHT AT BEDTIME   traMADol (ULTRAM) 50 MG tablet TAKE TWO TABLETS BY MOUTH EVERY 6 HOURS AS NEEDED FOR PAIN (Patient taking differently: Take 50 mg by mouth every 6 (six) hours.)     Review of Systems      All other systems reviewed and are otherwise negative except as noted above.  Physical Exam    VS:  BP (!) 112/58   Pulse 64   Ht 5\' 8"  (1.727 m)   Wt 197 lb 9.6 oz (89.6 kg)   SpO2 100%   BMI 30.04 kg/m  , BMI Body mass index is 30.04 kg/m.  Wt Readings from  Last 3 Encounters:  04/03/23 197 lb 9.6 oz (89.6 kg)  03/22/23 196 lb 6.9 oz (89.1 kg)  02/22/23 201 lb 6.4 oz (91.4 kg)     GEN: Well nourished, well developed, in no acute distress. HEENT: normal. Neck: Supple, no JVD, carotid bruits, or masses. Cardiac: RRR, no murmurs, rubs, or gallops. No clubbing, cyanosis, edema.  Radials/PT 2+ and equal bilaterally.  Respiratory:  Respirations regular and unlabored, clear to auscultation bilaterally. GI: Soft, nontender, nondistended. MS: No deformity or atrophy. Skin: Warm and dry, no rash. Neuro:  Strength and sensation are intact. Psych: Normal affect.  Assessment & Plan    CAD s/p CABG x 3 -Continue current medications which include aspirin 81 mg daily,  Lopressor 25 mg twice a day, Crestor 40 mg daily -He is on a multivitamin for anemia and he would like to take this every other day due to constipation  Hypertension -Continue current medications.  Blood pressure well-controlled at 112/58 -Continue low-sodium, heart healthy diet -Recommended cardiac rehab (they will do through Dr. Marcille Blanco office)  HLD -Continue Crestor 40 mg daily -Last LDL was 98 (03/09/2023) -We discussed that he will need much better control with recent guidelines being LDL less than 55 for patients with CAD especially with a strong family history -Would recommend addition of Zetia or perhaps will need an injectable option like Repatha or Praluent for further LDL reduction -Patient and wife would like to wait for platelets to resolve and then check a lipid panel again to see where his LDL is before making a medication adjustment  Multiple myeloma  -Now with severe neuropathy status post multiple treatments -Metanx supplement was discussed and I had a discussion with Pharm.D.  To be safe to take alongside his cardiac medications -He is also on Lyrica currently 200 mg twice a day       Disposition: Follow up 3-4 month (will likely f/u with Dr. Chales Abrahams) with Lance Muss, MD or APP.  Signed, Sharlene Dory, PA-C 04/03/2023, 12:25 PM [facility]

## 2023-04-03 ENCOUNTER — Ambulatory Visit: Payer: Medicare Other | Attending: Physician Assistant | Admitting: Physician Assistant

## 2023-04-03 ENCOUNTER — Encounter: Payer: Self-pay | Admitting: Physician Assistant

## 2023-04-03 VITALS — BP 112/58 | HR 64 | Ht 68.0 in | Wt 197.6 lb

## 2023-04-03 DIAGNOSIS — C9000 Multiple myeloma not having achieved remission: Secondary | ICD-10-CM | POA: Diagnosis not present

## 2023-04-03 DIAGNOSIS — I2510 Atherosclerotic heart disease of native coronary artery without angina pectoris: Secondary | ICD-10-CM | POA: Diagnosis not present

## 2023-04-03 DIAGNOSIS — I10 Essential (primary) hypertension: Secondary | ICD-10-CM

## 2023-04-03 DIAGNOSIS — E785 Hyperlipidemia, unspecified: Secondary | ICD-10-CM

## 2023-04-03 MED ORDER — FUROSEMIDE 20 MG PO TABS: ORAL_TABLET | ORAL | 3 refills | Status: DC

## 2023-04-03 NOTE — Patient Instructions (Addendum)
Medication Instructions:  1.Start furosemide (Lasix) 20 mg daily for 3 days and then as needed thereafter for lower extremity edema *If you need a refill on your cardiac medications before your next appointment, please call your pharmacy*  Lab Work: CBC in August at Dr Marcille Blanco office If you have labs (blood work) drawn today and your tests are completely normal, you will receive your results only by: [employer] (if you have MyChart) OR A paper copy in the mail If you have any lab test that is abnormal or we need to change your treatment, we will call you to review the results.  Follow-Up: At [facility], you and your health needs are our priority.  As part of our continuing mission to provide you with exceptional heart care, we have created designated Provider Care Teams.  These Care Teams include your primary Cardiologist (physician) and Advanced Practice Providers (APPs -  Physician Assistants and Nurse Practitioners) who all work together to provide you with the care you need, when you need it.  Your next appointment:   Follow up with Dr Chales Abrahams  Other Instructions Weigh every morning after using the restroom, before breakfast and call and let Dr Chales Abrahams know if you have a weight gain of 2 lbs or more overnight or 5 lbs or more in a week.    Low-Sodium Eating Plan Salt (sodium) helps you keep a healthy balance of fluids in your body. Too much sodium can raise your blood pressure. It can also cause fluid and waste to be held in your body. Your health care provider or dietitian may recommend a low-sodium eating plan if you have high blood pressure (hypertension), kidney disease, liver disease, or heart failure. Eating less sodium can help lower your blood pressure and reduce swelling. It can also protect your heart, liver, and kidneys. What are tips for following this plan? Reading food labels  Check food labels for the amount of sodium per serving. If you eat more than one  serving, you must multiply the listed amount by the number of servings. Choose foods with less than 140 milligrams (mg) of sodium per serving. Avoid foods with 300 mg of sodium or more per serving. Always check how much sodium is in a product, even if the label says "unsalted" or "no salt added." Shopping  Buy products labeled as "low-sodium" or "no salt added." Buy fresh foods. Avoid canned foods and pre-made or frozen meals. Avoid canned, cured, or processed meats. Buy breads that have less than 80 mg of sodium per slice. Cooking  Eat more home-cooked food. Try to eat less restaurant, buffet, and fast food. Try not to add salt when you cook. Use salt-free seasonings or herbs instead of table salt or sea salt. Check with your provider or pharmacist before using salt substitutes. Cook with plant-based oils, such as canola, sunflower, or olive oil. Meal planning When eating at a restaurant, ask if your food can be made with less salt or no salt. Avoid dishes labeled as brined, pickled, cured, or smoked. Avoid dishes made with soy sauce, miso, or teriyaki sauce. Avoid foods that have monosodium glutamate (MSG) in them. MSG may be added to some restaurant food, sauces, soups, bouillon, and canned foods. Make meals that can be grilled, baked, poached, roasted, or steamed. These are often made with less sodium. General information Try to limit your sodium intake to 1,500-2,300 mg each day, or the amount told by your provider. What foods should I eat? Fruits Fresh,  frozen, or canned fruit. Fruit juice. Vegetables Fresh or frozen vegetables. "No salt added" canned vegetables. "No salt added" tomato sauce and paste. Low-sodium or reduced-sodium tomato and vegetable juice. Grains Low-sodium cereals, such as oats, puffed wheat and rice, and shredded wheat. Low-sodium crackers. Unsalted rice. Unsalted pasta. Low-sodium bread. Whole grain breads and whole grain pasta. Meats and other proteins Fresh  or frozen meat, poultry, seafood, and fish. These should have no added salt. Low-sodium canned tuna and salmon. Unsalted nuts. Dried peas, beans, and lentils without added salt. Unsalted canned beans. Eggs. Unsalted nut butters. Dairy Milk. Soy milk. Cheese that is naturally low in sodium, such as ricotta cheese, fresh mozzarella, or Swiss cheese. Low-sodium or reduced-sodium cheese. Cream cheese. Yogurt. Seasonings and condiments Fresh and dried herbs and spices. Salt-free seasonings. Low-sodium mustard and ketchup. Sodium-free salad dressing. Sodium-free light mayonnaise. Fresh or refrigerated horseradish. Lemon juice. Vinegar. Other foods Homemade, reduced-sodium, or low-sodium soups. Unsalted popcorn and pretzels. Low-salt or salt-free chips. The items listed above may not be all the foods and drinks you can have. Talk to a dietitian to learn more. What foods should I avoid? Vegetables Sauerkraut, pickled vegetables, and relishes. Olives. [place] fries. Onion rings. Regular canned vegetables, except low-sodium or reduced-sodium items. Regular canned tomato sauce and paste. Regular tomato and vegetable juice. Frozen vegetables in sauces. Grains Instant hot cereals. Bread stuffing, pancake, and biscuit mixes. Croutons. Seasoned rice or pasta mixes. Noodle soup cups. Boxed or frozen macaroni and cheese. Regular salted crackers. Self-rising flour. Meats and other proteins Meat or fish that is salted, canned, smoked, spiced, or pickled. Precooked or cured meat, such as sausages or meat loaves. Tomasa Blase. Ham. Pepperoni. Hot dogs. Corned beef. Chipped beef. Salt pork. Jerky. Pickled herring, anchovies, and sardines. Regular canned tuna. Salted nuts. Dairy Processed cheese and cheese spreads. Hard cheeses. Cheese curds. Blue cheese. Feta cheese. String cheese. Regular cottage cheese. Buttermilk. Canned milk. Fats and oils Salted butter. Regular margarine. Ghee. Bacon fat. Seasonings and condiments Onion  salt, garlic salt, seasoned salt, table salt, and sea salt. Canned and packaged gravies. Worcestershire sauce. Tartar sauce. Barbecue sauce. Teriyaki sauce. Soy sauce, including reduced-sodium soy sauce. Steak sauce. Fish sauce. Oyster sauce. Cocktail sauce. Horseradish that you find on the shelf. Regular ketchup and mustard. Meat flavorings and tenderizers. Bouillon cubes. Hot sauce. Pre-made or packaged marinades. Pre-made or packaged taco seasonings. Relishes. Regular salad dressings. Salsa. Other foods Salted popcorn and pretzels. Corn chips and puffs. Potato and tortilla chips. Canned or dried soups. Pizza. Frozen entrees and pot pies. The items listed above may not be all the foods and drinks you should avoid. Talk to a dietitian to learn more. This information is not intended to replace advice given to you by your health care provider. Make sure you discuss any questions you have with your health care provider. Document Revised: 11/03/2022 Document Reviewed: 11/03/2022 Elsevier Patient Education  2024 [employer]  Heart-Healthy Eating Plan Many factors influence your heart health, including eating and exercise habits. Heart health is also called coronary health. Coronary risk increases with abnormal blood fat (lipid) levels. A heart-healthy eating plan includes limiting unhealthy fats, increasing healthy fats, limiting salt (sodium) intake, and making other diet and lifestyle changes. What is my plan? Your health care provider may recommend that: You limit your fat intake to _________% or less of your total calories each day. You limit your saturated fat intake to _________% or less of your total calories each day. You limit the amount  of cholesterol in your diet to less than _________ mg per day. You limit the amount of sodium in your diet to less than _________ mg per day. What are tips for following this plan? Cooking Cook foods using methods other than frying. Baking, boiling,  grilling, and broiling are all good options. Other ways to reduce fat include: Removing the skin from poultry. Removing all visible fats from meats. Steaming vegetables in water or broth. Meal planning  At meals, imagine dividing your plate into fourths: Fill one-half of your plate with vegetables and green salads. Fill one-fourth of your plate with whole grains. Fill one-fourth of your plate with lean protein foods. Eat 2-4 cups of vegetables per day. One cup of vegetables equals 1 cup (91 g) broccoli or cauliflower florets, 2 medium carrots, 1 large bell pepper, 1 large sweet potato, 1 large tomato, 1 medium white potato, 2 cups (150 g) raw leafy greens. Eat 1-2 cups of fruit per day. One cup of fruit equals 1 small apple, 1 large banana, 1 cup (237 g) mixed fruit, 1 large orange,  cup (82 g) dried fruit, 1 cup (240 mL) 100% fruit juice. Eat more foods that contain soluble fiber. Examples include apples, broccoli, carrots, beans, peas, and barley. Aim to get 25-30 g of fiber per day. Increase your consumption of legumes, nuts, and seeds to 4-5 servings per week. One serving of dried beans or legumes equals  cup (90 g) cooked, 1 serving of nuts is  oz (12 almonds, 24 pistachios, or 7 walnut halves), and 1 serving of seeds equals  oz (8 g). Fats Choose healthy fats more often. Choose monounsaturated and polyunsaturated fats, such as olive and canola oils, avocado oil, flaxseeds, walnuts, almonds, and seeds. Eat more omega-3 fats. Choose salmon, mackerel, sardines, tuna, flaxseed oil, and ground flaxseeds. Aim to eat fish at least 2 times each week. Check food labels carefully to identify foods with trans fats or high amounts of saturated fat. Limit saturated fats. These are found in animal products, such as meats, butter, and cream. Plant sources of saturated fats include palm oil, palm kernel oil, and coconut oil. Avoid foods with partially hydrogenated oils in them. These contain trans  fats. Examples are stick margarine, some tub margarines, cookies, crackers, and other baked goods. Avoid fried foods. General information Eat more home-cooked food and less restaurant, buffet, and fast food. Limit or avoid alcohol. Limit foods that are high in added sugar and simple starches such as foods made using white refined flour (white breads, pastries, sweets). Lose weight if you are overweight. Losing just 5-10% of your body weight can help your overall health and prevent diseases such as diabetes and heart disease. Monitor your sodium intake, especially if you have high blood pressure. Talk with your health care provider about your sodium intake. Try to incorporate more vegetarian meals weekly. What foods should I eat? Fruits All fresh, canned (in natural juice), or frozen fruits. Vegetables Fresh or frozen vegetables (raw, steamed, roasted, or grilled). Green salads. Grains Most grains. Choose whole wheat and whole grains most of the time. Rice and pasta, including brown rice and pastas made with whole wheat. Meats and other proteins Lean, well-trimmed beef, veal, pork, and lamb. Chicken and [place] without skin. All fish and shellfish. Wild duck, rabbit, pheasant, and venison. Egg whites or low-cholesterol egg substitutes. Dried beans, peas, lentils, and tofu. Seeds and most nuts. Dairy Low-fat or nonfat cheeses, including ricotta and mozzarella. Skim or 1% milk (liquid,  powdered, or evaporated). Buttermilk made with low-fat milk. Nonfat or low-fat yogurt. Fats and oils Non-hydrogenated (trans-free) margarines. Vegetable oils, including soybean, sesame, sunflower, olive, avocado, peanut, safflower, corn, canola, and cottonseed. Salad dressings or mayonnaise made with a vegetable oil. Beverages Water (mineral or sparkling). Coffee and tea. Unsweetened ice tea. Diet beverages. Sweets and desserts Sherbet, gelatin, and fruit ice. Small amounts of dark chocolate. Limit all sweets and  desserts. Seasonings and condiments All seasonings and condiments. The items listed above may not be a complete list of foods and beverages you can eat. Contact a dietitian for more options. What foods should I avoid? Fruits Canned fruit in heavy syrup. Fruit in cream or butter sauce. Fried fruit. Limit coconut. Vegetables Vegetables cooked in cheese, cream, or butter sauce. Fried vegetables. Grains Breads made with saturated or trans fats, oils, or whole milk. Croissants. Sweet rolls. Donuts. High-fat crackers, such as cheese crackers and chips. Meats and other proteins Fatty meats, such as hot dogs, ribs, sausage, bacon, rib-eye roast or steak. High-fat deli meats, such as salami and bologna. Caviar. Domestic duck and goose. Organ meats, such as liver. Dairy Cream, sour cream, cream cheese, and creamed cottage cheese. Whole-milk cheeses. Whole or 2% milk (liquid, evaporated, or condensed). Whole buttermilk. Cream sauce or high-fat cheese sauce. Whole-milk yogurt. Fats and oils Meat fat, or shortening. Cocoa butter, hydrogenated oils, palm oil, coconut oil, palm kernel oil. Solid fats and shortenings, including bacon fat, salt pork, lard, and butter. Nondairy cream substitutes. Salad dressings with cheese or sour cream. Beverages Regular sodas and any drinks with added sugar. Sweets and desserts Frosting. Pudding. Cookies. Cakes. Pies. Milk chocolate or white chocolate. Buttered syrups. Full-fat ice cream or ice cream drinks. The items listed above may not be a complete list of foods and beverages to avoid. Contact a dietitian for more information. Summary Heart-healthy meal planning includes limiting unhealthy fats, increasing healthy fats, limiting salt (sodium) intake and making other diet and lifestyle changes. Lose weight if you are overweight. Losing just 5-10% of your body weight can help your overall health and prevent diseases such as diabetes and heart disease. Focus on eating a  balance of foods, including fruits and vegetables, low-fat or nonfat dairy, lean protein, nuts and legumes, whole grains, and heart-healthy oils and fats. This information is not intended to replace advice given to you by your health care provider. Make sure you discuss any questions you have with your health care provider. Document Revised: 11/22/2021 Document Reviewed: 11/22/2021 Elsevier Patient Education  2024 [employer].

## 2023-04-11 ENCOUNTER — Other Ambulatory Visit: Payer: Self-pay | Admitting: Surgery

## 2023-04-11 DIAGNOSIS — Z951 Presence of aortocoronary bypass graft: Secondary | ICD-10-CM

## 2023-04-12 ENCOUNTER — Encounter: Payer: Self-pay | Admitting: Surgery

## 2023-04-12 ENCOUNTER — Ambulatory Visit
Admission: RE | Admit: 2023-04-12 | Discharge: 2023-04-12 | Disposition: A | Discharge status: Home / Self Care | Payer: Medicare Other | Source: Ambulatory Visit | Attending: Surgery | Admitting: Surgery

## 2023-04-12 ENCOUNTER — Ambulatory Visit (INDEPENDENT_AMBULATORY_CARE_PROVIDER_SITE_OTHER): Payer: Self-pay | Admitting: Surgery

## 2023-04-12 VITALS — BP 110/69 | HR 62 | Resp 18 | Ht 68.0 in | Wt 196.0 lb

## 2023-04-12 DIAGNOSIS — Z951 Presence of aortocoronary bypass graft: Secondary | ICD-10-CM

## 2023-04-12 NOTE — Progress Notes (Signed)
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 The patient's early postoperative recovery while in the hospital was notable for a slow postoperative course due to a history of myeloma. Since hospital discharge the patient reports that he has been walking daily without chest pain or shortness of breath.  His stamina continues to improve.   Current Outpatient Medications  Medication Sig Dispense Refill   ACETAMINOPHEN ER PO Take 1,000 mg by mouth in the morning, at noon, in the evening, and at bedtime.     aspirin EC 81 MG tablet Take 81 mg by mouth at bedtime.     calcium carbonate (OS-CAL - DOSED IN MG OF ELEMENTAL CALCIUM) 1250 (500 Ca) MG tablet Take 1 tablet (500 mg of elemental calcium total) by mouth 2 (two) times daily with a meal. 60 tablet 0   Fe Fum-Vit C-Vit B12-FA (TRIGELS-F FORTE) CAPS capsule Take 1 capsule by mouth daily after breakfast. 30 capsule 0   furosemide (LASIX) 20 MG tablet Take 1 tablet by mouth daily for 3 days, then take daily as needed thereafter for lower extremity edema 30 tablet 3   levETIRAcetam (KEPPRA) 500 MG tablet TAKE 1 TABLET BY MOUTH 2 TIMES A DAY 60 tablet 3   loperamide (IMODIUM) 2 MG capsule Take 1 capsule (2 mg total) by mouth as needed for diarrhea or loose stools. 30 capsule 0   MAGNESIUM PO Take 100 mg by mouth daily.     metoprolol tartrate (LOPRESSOR) 25 MG tablet Take 1 tablet (25 mg total) by mouth 2 (two) times daily. 60 tablet 1   ondansetron (ZOFRAN) 8 MG tablet TAKE 1 TABLET BY MOUTH EVERY 8 HOURS AS NEEDED NAUSEA AND VOMITING (Patient taking differently: Take 8 mg by mouth every 8 (eight) hours as needed for nausea or vomiting.) 180 tablet 0   pantoprazole (PROTONIX) 40 MG tablet TAKE 1 TABLET BY MOUTH 2 TIMES A DAY BEFORE MEALS FOR ACID REFLUX (Patient taking differently: Take 40 mg by mouth 2 (two) times daily before a meal.) 180 tablet 0   polyethylene glycol  powder (GLYCOLAX/MIRALAX) 17 GM/SCOOP powder Mix 17g (1 capful) with 8 oz of favorite beverage and drink every morning     pregabalin (LYRICA) 200 MG capsule TAKE 1 CAPSULE BY MOUTH 2 TIMES DAILY 180 capsule 0   prochlorperazine (COMPAZINE) 10 MG tablet Take 1 tablet (10 mg total) by mouth every 6 (six) hours as needed for nausea or vomiting. 30 tablet 3   rosuvastatin (CRESTOR) 40 MG tablet Take 1 tablet (40 mg total) by mouth daily. 30 tablet 1   saccharomyces boulardii (FLORASTOR) 250 MG capsule Take 250 mg by mouth daily.     tamsulosin (FLOMAX) 0.4 MG CAPS capsule TAKE 1 CAPSULE BY MOUTH EVERY NIGHT AT BEDTIME 90 capsule 0   traMADol (ULTRAM) 50 MG tablet TAKE TWO TABLETS BY MOUTH EVERY 6 HOURS AS NEEDED FOR PAIN (Patient taking differently: Take 50 mg by mouth every 6 (six) hours.) 240 tablet 0   No current facility-administered medications for this visit.    Physical Exam: BP 110/69   Pulse 62   Resp 18   Ht 5\' 8"  (1.727 m)   Wt 196 lb (88.9 kg)   SpO2 94% Comment: RA  BMI 29.80 kg/m  He looks well. Cardiac exam shows a regular rate and rhythm with normal heart sounds. Lungs are clear. The chest incision is healing well and the sternum is stable.  His leg incision is healing well and there is no peripheral edema.  Diagnostic Tests:  Narrative & Impression  CLINICAL DATA:  Recent coronary artery bypass surgery   EXAM: CHEST - 2 VIEW   COMPARISON:  Previous studies including the examination of 03/19/2023   FINDINGS: Cardiac size is within normal limits. Lung fields are clear of any infiltrates or pulmonary edema. There is previous coronary bypass surgery. There is no pleural effusion or pneumothorax. Deformities in left fourth rib and left scapula have not changed.   IMPRESSION: No active cardiopulmonary disease.     Electronically Signed   By: Ernie Avena M.D.   On: 04/12/2023 12:14    Impression:  Overall I think he is making a good recovery  following his surgery.  He is very motivated to continue increasing his physical activity.  I think he could start outpatient cardiac rehab at any time.  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  Plan:  He will continue to follow-up with his PCP and cardiology and I will see him back if there are any problems with his incisions.  Alleen Borne, MD [facility] [phone]

## 2023-04-14 ENCOUNTER — Telehealth: Payer: Self-pay | Admitting: Physician Assistant

## 2023-04-14 NOTE — Telephone Encounter (Signed)
Rehab notified EKG faxed to listed number ./[email or web address]

## 2023-04-14 NOTE — Telephone Encounter (Signed)
[facility] at [facility] in [place] called in stating pt was referred there for the cardiac rehab but they are unable to see his most recent EKG in [facility] and they want to know if it can faxed over. Please advise.    Fax: [phone]

## 2023-04-18 ENCOUNTER — Other Ambulatory Visit: Payer: Self-pay | Admitting: Interventional Cardiology

## 2023-04-18 ENCOUNTER — Other Ambulatory Visit: Payer: Self-pay | Admitting: Physician Assistant

## 2023-04-19 ENCOUNTER — Inpatient Hospital Stay: Payer: Medicare Other | Attending: Hematology & Oncology

## 2023-04-19 ENCOUNTER — Inpatient Hospital Stay (HOSPITAL_BASED_OUTPATIENT_CLINIC_OR_DEPARTMENT_OTHER): Payer: Medicare Other | Admitting: Hematology & Oncology

## 2023-04-19 ENCOUNTER — Inpatient Hospital Stay: Payer: Medicare Other

## 2023-04-19 ENCOUNTER — Other Ambulatory Visit: Payer: Self-pay

## 2023-04-19 VITALS — BP 112/47 | HR 54 | Temp 98.5°F | Resp 18 | Ht 68.0 in | Wt 198.0 lb

## 2023-04-19 DIAGNOSIS — C9000 Multiple myeloma not having achieved remission: Secondary | ICD-10-CM

## 2023-04-19 DIAGNOSIS — C419 Malignant neoplasm of bone and articular cartilage, unspecified: Secondary | ICD-10-CM

## 2023-04-19 DIAGNOSIS — Z9484 Stem cells transplant status: Secondary | ICD-10-CM | POA: Insufficient documentation

## 2023-04-19 DIAGNOSIS — Z5112 Encounter for antineoplastic immunotherapy: Secondary | ICD-10-CM | POA: Insufficient documentation

## 2023-04-19 LAB — CBC WITH DIFFERENTIAL (CANCER CENTER ONLY)
Abs Immature Granulocytes: 0.03 10*3/uL (ref 0.00–0.07)
Basophils Absolute: 0 10*3/uL (ref 0.0–0.1)
Basophils Relative: 0 %
Eosinophils Absolute: 0.1 10*3/uL (ref 0.0–0.5)
Eosinophils Relative: 2 %
HCT: 35 % — ABNORMAL LOW (ref 39.0–52.0)
Hemoglobin: 11 g/dL — ABNORMAL LOW (ref 13.0–17.0)
Immature Granulocytes: 1 %
Lymphocytes Relative: 10 %
Lymphs Abs: 0.6 10*3/uL — ABNORMAL LOW (ref 0.7–4.0)
MCH: 30.9 pg (ref 26.0–34.0)
MCHC: 31.4 g/dL (ref 30.0–36.0)
MCV: 98.3 fL (ref 80.0–100.0)
Monocytes Absolute: 0.9 10*3/uL (ref 0.1–1.0)
Monocytes Relative: 14 %
Neutro Abs: 4.7 10*3/uL (ref 1.7–7.7)
Neutrophils Relative %: 73 %
Platelet Count: 142 10*3/uL — ABNORMAL LOW (ref 150–400)
RBC: 3.56 MIL/uL — ABNORMAL LOW (ref 4.22–5.81)
RDW: 14 % (ref 11.5–15.5)
WBC Count: 6.4 10*3/uL (ref 4.0–10.5)
nRBC: 0 % (ref 0.0–0.2)

## 2023-04-19 LAB — CMP (CANCER CENTER ONLY)
ALT: 14 U/L (ref 0–44)
AST: 12 U/L — ABNORMAL LOW (ref 15–41)
Albumin: 3.9 g/dL (ref 3.5–5.0)
Alkaline Phosphatase: 87 U/L (ref 38–126)
Anion gap: 7 (ref 5–15)
BUN: 23 mg/dL (ref 8–23)
CO2: 28 mmol/L (ref 22–32)
Calcium: 9.4 mg/dL (ref 8.9–10.3)
Chloride: 108 mmol/L (ref 98–111)
Creatinine: 1.19 mg/dL (ref 0.61–1.24)
GFR, Estimated: >60 mL/min (ref >60)
Glucose, Bld: 117 mg/dL — ABNORMAL HIGH (ref 70–99)
Potassium: 4.6 mmol/L (ref 3.5–5.1)
Sodium: 143 mmol/L (ref 135–145)
Total Bilirubin: 0.3 mg/dL (ref 0.3–1.2)
Total Protein: 5.6 g/dL — ABNORMAL LOW (ref 6.5–8.1)

## 2023-04-19 LAB — LACTATE DEHYDROGENASE: LDH: 121 U/L (ref 98–192)

## 2023-04-19 MED ORDER — DIPHENHYDRAMINE HCL 25 MG PO CAPS: 50.0000 mg | ORAL_CAPSULE | Freq: Once | ORAL | Status: DC

## 2023-04-19 MED ORDER — ACETAMINOPHEN 325 MG PO TABS: 650.0000 mg | ORAL_TABLET | Freq: Once | ORAL | Status: DC

## 2023-04-19 MED ORDER — DARATUMUMAB-HYALURONIDASE-FIHJ 1800-30000 MG-UT/15ML ~~LOC~~ SOLN
1800.0000 mg | Freq: Once | SUBCUTANEOUS | Status: AC
  Administered 2023-04-19: 1800 mg via SUBCUTANEOUS
  Filled 2023-04-19: qty 15

## 2023-04-19 MED ORDER — FAMOTIDINE 20 MG PO TABS: 40.0000 mg | ORAL_TABLET | Freq: Once | ORAL | Status: DC

## 2023-04-19 NOTE — Progress Notes (Signed)
Hematology and Oncology Follow Up Visit  Zarek Dacko [id number] 1951-03-29 72 y.o. 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 - t(11:14), 13q-, 1p-,1q-  Past Therapy: Status post surgical repair of left femur-07/14/2020 XRT to left shoulder and left hip Xgeva 120 mg subcu every 3 months-next dose in December 2022  Faspro/Velcade/Revlimid/Decadron -- s/p cycle #2 -- start on       08/06/2020  ( Revlimid is 14 on /14 off)    Current Therapy:        ASCT -- [facility] on -01/22/2021 Faspro - q month -- maintenance -- start on 05/13/2021 Zometa 4 mg IV every 3 months-next dose in 05/2023  Radiation therapy to T12, left iliac and left humerus --completed on 10/04/2022 --XRT to right femoral head -start 01/03/2023   Interim History:  Mr. Mooneyhan is here today with his wife for follow-up.  The real big news is that he had a cardiac bypass.  He had this back in May.  He has three-vessel bypass.  He really did well with the bypass.  He is still recovering.  He is going to cardiac rehab I think this week or next week.  Otherwise, he seems to be doing quite well.  We probably have to do another PET scan on him.  We will get this set up for him in July.  When we last saw him, there was no monoclonal spike in his blood.  The kappa light chain was 0.6 mg/dL.  He has had no fever.  There has been no bleeding.  He has had a little bit of leg swelling.  There is been no cough or shortness of breath.  Overall, I would say that his performance status is probably ECOG 1.  . Medications:  Allergies as of 04/19/2023   No Known Allergies      Medication List        Accurate as of April 19, 2023 10:04 AM. If you have any questions, ask your nurse or doctor.          ACETAMINOPHEN ER PO Take 1,000 mg by mouth in the morning, at noon, in the evening, and at bedtime.   aspirin EC 81 MG tablet Take 81 mg by mouth  at bedtime.   calcium carbonate 1250 (500 Ca) MG tablet Commonly known as: OS-CAL - dosed in mg of elemental calcium Take 1 tablet (500 mg of elemental calcium total) by mouth 2 (two) times daily with a meal.   Fe Fum-Vit C-Vit B12-FA Caps capsule Commonly known as: TRIGELS-F FORTE Take 1 capsule by mouth daily after breakfast.   furosemide 20 MG tablet Commonly known as: LASIX Take 1 tablet by mouth daily for 3 days, then take daily as needed thereafter for lower extremity edema   levETIRAcetam 500 MG tablet Commonly known as: KEPPRA TAKE 1 TABLET BY MOUTH 2 TIMES A DAY   loperamide 2 MG capsule Commonly known as: IMODIUM Take 1 capsule (2 mg total) by mouth as needed for diarrhea or loose stools.   MAGNESIUM PO Take 100 mg by mouth daily.   metoprolol tartrate 25 MG tablet Commonly known as: LOPRESSOR TAKE 1 TABLET BY MOUTH 2 TIMES A DAY   ondansetron 8 MG tablet Commonly known as: ZOFRAN TAKE 1 TABLET BY MOUTH EVERY 8 HOURS AS NEEDED NAUSEA AND VOMITING What changed:  how much to take how to take this when to take this reasons to take  this additional instructions   pantoprazole 40 MG tablet Commonly known as: PROTONIX TAKE 1 TABLET BY MOUTH 2 TIMES A DAY BEFORE MEALS FOR ACID REFLUX What changed: See the new instructions.   polyethylene glycol powder 17 GM/SCOOP powder Commonly known as: GLYCOLAX/MIRALAX Mix 17g (1 capful) with 8 oz of favorite beverage and drink every morning   pregabalin 200 MG capsule Commonly known as: LYRICA TAKE 1 CAPSULE BY MOUTH 2 TIMES DAILY   prochlorperazine 10 MG tablet Commonly known as: COMPAZINE Take 1 tablet (10 mg total) by mouth every 6 (six) hours as needed for nausea or vomiting.   rosuvastatin 40 MG tablet Commonly known as: CRESTOR TAKE 1 TABLET BY MOUTH EVERY DAY FOR CHOLESTEROL   saccharomyces boulardii 250 MG capsule Commonly known as: FLORASTOR Take 250 mg by mouth daily.   tamsulosin 0.4 MG Caps  capsule Commonly known as: FLOMAX TAKE 1 CAPSULE BY MOUTH EVERY NIGHT AT BEDTIME   traMADol 50 MG tablet Commonly known as: ULTRAM TAKE TWO TABLETS BY MOUTH EVERY 6 HOURS AS NEEDED FOR PAIN What changed: See the new instructions.        Allergies: No Known Allergies  Past Medical History, Surgical history, Social history, and Family History were reviewed and updated.  Review of Systems: Review of Systems  Constitutional: Negative.   HENT: Negative.    Eyes: Negative.   Respiratory: Negative.    Cardiovascular: Negative.   Gastrointestinal: Negative.   Genitourinary: Negative.   Musculoskeletal:  Positive for joint pain.  Skin: Negative.   Neurological: Negative.   Endo/Heme/Allergies: Negative.   Psychiatric/Behavioral: Negative.       Physical Exam:  height is 5\' 8"  (1.727 m) and weight is 198 lb (89.8 kg). His oral temperature is 98.5 F (36.9 C). His blood pressure is 112/47 (abnormal) and his pulse is 54 (abnormal). His respiration is 18 and oxygen saturation is 98%.   Wt Readings from Last 3 Encounters:  04/19/23 198 lb (89.8 kg)  04/12/23 196 lb (88.9 kg)  04/03/23 197 lb 9.6 oz (89.6 kg)    Physical Exam Vitals reviewed.  Constitutional:      Comments: This is a well-developed and well-nourished white male in no obvious distress.  He has a nicely healed median sternotomy scar.  HENT:     Head: Normocephalic and atraumatic.  Eyes:     Pupils: Pupils are equal, round, and reactive to light.  Cardiovascular:     Rate and Rhythm: Normal rate and regular rhythm.     Heart sounds: Normal heart sounds.  Pulmonary:     Effort: Pulmonary effort is normal.     Breath sounds: Normal breath sounds.  Abdominal:     General: Bowel sounds are normal.     Palpations: Abdomen is soft.  Musculoskeletal:        General: No tenderness or deformity. Normal range of motion.     Cervical back: Normal range of motion.     Comments: There is pain to palpation in the left  hip area.  This is in the lateral hip area.  He has some decreased range of motion.  Lymphadenopathy:     Cervical: No cervical adenopathy.  Skin:    General: Skin is warm and dry.     Findings: No erythema or rash.  Neurological:     Mental Status: He is alert and oriented to person, place, and time.  Psychiatric:        Behavior: Behavior normal.  Thought Content: Thought content normal.        Judgment: Judgment normal.      Lab Results  Component Value Date   WBC 6.4 04/19/2023   HGB 11.0 (L) 04/19/2023   HCT 35.0 (L) 04/19/2023   MCV 98.3 04/19/2023   PLT 142 (L) 04/19/2023   Lab Results  Component Value Date   FERRITIN 938 (H) 02/23/2021   IRON 67 02/23/2021   TIBC 156 (L) 02/23/2021   UIBC 90 (L) 02/23/2021   IRONPCTSAT 43 02/23/2021   Lab Results  Component Value Date   RBC 3.56 (L) 04/19/2023   Lab Results  Component Value Date   KPAFRELGTCHN 6.4 02/22/2023   LAMBDASER 4.2 (L) 02/22/2023   KAPLAMBRATIO 1.52 02/22/2023   Lab Results  Component Value Date   IGGSERUM 290 (L) 02/22/2023   IGA 14 (L) 02/22/2023   IGMSERUM 9 (L) 02/22/2023   Lab Results  Component Value Date   TOTALPROTELP 5.6 (L) 02/22/2023   ALBUMINELP 3.3 02/22/2023   A1GS 0.2 02/22/2023   A2GS 0.9 02/22/2023   BETS 0.8 02/22/2023   GAMS 0.3 (L) 02/22/2023   MSPIKE Not Observed 02/22/2023     Chemistry      Component Value Date/Time   NA 143 04/19/2023 0850   K 4.6 04/19/2023 0850   CL 108 04/19/2023 0850   CO2 28 04/19/2023 0850   BUN 23 04/19/2023 0850   CREATININE 1.19 04/19/2023 0850      Component Value Date/Time   CALCIUM 9.4 04/19/2023 0850   ALKPHOS 87 04/19/2023 0850   AST 12 (L) 04/19/2023 0850   ALT 14 04/19/2023 0850   BILITOT 0.3 04/19/2023 0850       Impression and Plan: Mr. Sandra is a very pleasant 72 yo gentleman with 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   I am glad that he got through his  cardiac surgery.  Thankfully, he did very nicely.  I am sure that this will really help his quality of life and quantity of life.  I know has not had the Daratumumab for quite a while.  However I really do not think this is going to be a problem.  We will go ahead and get him set up with a PET scan again.  We will see his any changes on PET scan that would suggest the myeloma is progressing.  We will plan to get him back in 1 month.   Josph Macho, MD 6/19/202410:04 AM

## 2023-04-19 NOTE — Patient Instructions (Signed)
[facility]  Discharge Instructions: Thank you for choosing [facility] to provide your oncology and hematology care.   If you have a lab appointment with the Cancer Center, please go directly to the Cancer Center and check in at the registration area.  Wear comfortable clothing and clothing appropriate for easy access to any Portacath or PICC line.   We strive to give you quality time with your provider. You may need to reschedule your appointment if you arrive late (15 or more minutes).  Arriving late affects you and other patients whose appointments are after yours.  Also, if you miss three or more appointments without notifying the office, you may be dismissed from the clinic at the provider's discretion.      For prescription refill requests, have your pharmacy contact our office and allow 72 hours for refills to be completed.    Today you received the following chemotherapy and/or immunotherapy agents Darzalex      To help prevent nausea and vomiting after your treatment, we encourage you to take your nausea medication as directed.  BELOW ARE SYMPTOMS THAT SHOULD BE REPORTED IMMEDIATELY: *FEVER GREATER THAN 100.4 F (38 C) OR HIGHER *CHILLS OR SWEATING *NAUSEA AND VOMITING THAT IS NOT CONTROLLED WITH YOUR NAUSEA MEDICATION *UNUSUAL SHORTNESS OF BREATH *UNUSUAL BRUISING OR BLEEDING *URINARY PROBLEMS (pain or burning when urinating, or frequent urination) *BOWEL PROBLEMS (unusual diarrhea, constipation, pain near the anus) TENDERNESS IN MOUTH AND THROAT WITH OR WITHOUT PRESENCE OF ULCERS (sore throat, sores in mouth, or a toothache) UNUSUAL RASH, SWELLING OR PAIN  UNUSUAL VAGINAL DISCHARGE OR ITCHING   Items with * indicate a potential emergency and should be followed up as soon as possible or go to the Emergency Department if any problems should occur.  Please show the CHEMOTHERAPY ALERT CARD or IMMUNOTHERAPY ALERT CARD at check-in  to the Emergency Department and triage nurse. Should you have questions after your visit or need to cancel or reschedule your appointment, please contact [facility]  [phone] and follow the prompts.  Office hours are 8:00 a.m. to 4:30 p.m. Monday - Friday. Please note that voicemails left after 4:00 p.m. may not be returned until the following business day.  We are closed weekends and major holidays. You have access to a nurse at all times for urgent questions. Please call the main number to the clinic [phone] and follow the prompts.  For any non-urgent questions, you may also contact your provider using MyChart. We now offer e-Visits for anyone 18 and older to request care online for non-urgent symptoms. For details visit [email or web address].   Also download the MyChart app! Go to the app store, search "MyChart", open the app, select [facility], and log in with your MyChart username and password.   

## 2023-04-20 ENCOUNTER — Other Ambulatory Visit: Payer: Self-pay

## 2023-04-20 LAB — KAPPA/LAMBDA LIGHT CHAINS
Kappa free light chain: 8.8 mg/L (ref 3.3–19.4)
Kappa, lambda light chain ratio: 1.21 (ref 0.26–1.65)
Lambda free light chains: 7.3 mg/L (ref 5.7–26.3)

## 2023-04-20 LAB — IGG, IGA, IGM
IgA: 15 mg/dL — ABNORMAL LOW (ref 61–437)
IgG (Immunoglobin G), Serum: 315 mg/dL — ABNORMAL LOW (ref 603–1613)
IgM (Immunoglobulin M), Srm: 9 mg/dL — ABNORMAL LOW (ref 15–143)

## 2023-04-22 ENCOUNTER — Other Ambulatory Visit: Payer: Self-pay

## 2023-04-24 ENCOUNTER — Inpatient Hospital Stay: Payer: Medicare Other

## 2023-04-25 LAB — PROTEIN ELECTROPHORESIS, SERUM, WITH REFLEX
A/G Ratio: 1.5 (ref 0.7–1.7)
Albumin ELP: 3.2 g/dL (ref 2.9–4.4)
Alpha-1-Globulin: 0.3 g/dL (ref 0.0–0.4)
Alpha-2-Globulin: 0.8 g/dL (ref 0.4–1.0)
Beta Globulin: 0.7 g/dL (ref 0.7–1.3)
Gamma Globulin: 0.3 g/dL — ABNORMAL LOW (ref 0.4–1.8)
Globulin, Total: 2.1 g/dL — ABNORMAL LOW (ref 2.2–3.9)
M-Spike, %: 0.1 g/dL — ABNORMAL HIGH
SPEP Interpretation: 0
Total Protein ELP: 5.3 g/dL — ABNORMAL LOW (ref 6.0–8.5)

## 2023-04-25 LAB — IMMUNOFIXATION REFLEX, SERUM
IgA: 16 mg/dL — ABNORMAL LOW (ref 61–437)
IgG (Immunoglobin G), Serum: 312 mg/dL — ABNORMAL LOW (ref 603–1613)
IgM (Immunoglobulin M), Srm: 8 mg/dL — ABNORMAL LOW (ref 15–143)

## 2023-04-26 LAB — UPEP/UIFE/LIGHT CHAINS/TP, 24-HR UR
% BETA, Urine: 0 %
ALPHA 1 URINE: 0 %
Albumin, U: 100 %
Alpha 2, Urine: 0 %
Free Kappa Lt Chains,Ur: 10.88 mg/L (ref 1.17–86.46)
Free Kappa/Lambda Ratio: 5.61 (ref 1.83–14.26)
Free Lambda Lt Chains,Ur: 1.94 mg/L (ref 0.27–15.21)
GAMMA GLOBULIN URINE: 0 %
Total Protein, Urine-Ur/day: 168 mg/24 hr — ABNORMAL HIGH (ref 30–150)
Total Protein, Urine: 12.9 mg/dL
Total Volume: 1300

## 2023-04-27 ENCOUNTER — Encounter: Payer: Self-pay | Admitting: *Deleted

## 2023-05-01 ENCOUNTER — Other Ambulatory Visit: Payer: Self-pay | Admitting: Hematology & Oncology

## 2023-05-01 DIAGNOSIS — C9000 Multiple myeloma not having achieved remission: Secondary | ICD-10-CM

## 2023-05-01 DIAGNOSIS — E440 Moderate protein-calorie malnutrition: Secondary | ICD-10-CM

## 2023-05-08 ENCOUNTER — Other Ambulatory Visit: Payer: Self-pay | Admitting: Hematology & Oncology

## 2023-05-08 DIAGNOSIS — C9000 Multiple myeloma not having achieved remission: Secondary | ICD-10-CM

## 2023-05-09 ENCOUNTER — Ambulatory Visit (HOSPITAL_COMMUNITY)
Admission: RE | Admit: 2023-05-09 | Discharge: 2023-05-09 | Disposition: A | Discharge status: Home / Self Care | Payer: Medicare Other | Source: Ambulatory Visit | Attending: Hematology & Oncology | Admitting: Hematology & Oncology

## 2023-05-09 DIAGNOSIS — C9000 Multiple myeloma not having achieved remission: Secondary | ICD-10-CM | POA: Insufficient documentation

## 2023-05-09 LAB — GLUCOSE, CAPILLARY: Glucose-Capillary: 112 mg/dL — ABNORMAL HIGH (ref 70–99)

## 2023-05-09 MED ORDER — FLUDEOXYGLUCOSE F - 18 (FDG) INJECTION
10.0000 | Freq: Once | INTRAVENOUS | Status: AC
  Administered 2023-05-09: 9.97 via INTRAVENOUS

## 2023-05-17 ENCOUNTER — Inpatient Hospital Stay: Payer: Medicare Other | Attending: Hematology & Oncology | Admitting: Hematology & Oncology

## 2023-05-17 ENCOUNTER — Other Ambulatory Visit: Payer: Self-pay

## 2023-05-17 ENCOUNTER — Inpatient Hospital Stay: Payer: Medicare Other

## 2023-05-17 ENCOUNTER — Encounter: Payer: Self-pay | Admitting: Hematology & Oncology

## 2023-05-17 VITALS — BP 117/44 | HR 59 | Temp 97.7°F | Resp 18 | Ht 68.0 in | Wt 198.0 lb

## 2023-05-17 DIAGNOSIS — C9000 Multiple myeloma not having achieved remission: Secondary | ICD-10-CM | POA: Diagnosis present

## 2023-05-17 DIAGNOSIS — C419 Malignant neoplasm of bone and articular cartilage, unspecified: Secondary | ICD-10-CM

## 2023-05-17 DIAGNOSIS — Z5112 Encounter for antineoplastic immunotherapy: Secondary | ICD-10-CM | POA: Diagnosis present

## 2023-05-17 DIAGNOSIS — Z923 Personal history of irradiation: Secondary | ICD-10-CM | POA: Diagnosis not present

## 2023-05-17 MED ORDER — DARATUMUMAB-HYALURONIDASE-FIHJ 1800-30000 MG-UT/15ML ~~LOC~~ SOLN
1800.0000 mg | Freq: Once | SUBCUTANEOUS | Status: AC
  Administered 2023-05-17: 1800 mg via SUBCUTANEOUS
  Filled 2023-05-17: qty 15

## 2023-05-17 MED ORDER — ACETAMINOPHEN 325 MG PO TABS: 650.0000 mg | ORAL_TABLET | Freq: Once | ORAL | Status: DC

## 2023-05-17 MED ORDER — DIPHENHYDRAMINE HCL 25 MG PO CAPS: 50.0000 mg | ORAL_CAPSULE | Freq: Once | ORAL | Status: DC

## 2023-05-17 MED ORDER — ZOLEDRONIC ACID 4 MG/100ML IV SOLN
4.0000 mg | Freq: Once | INTRAVENOUS | Status: AC
  Administered 2023-05-17: 4 mg via INTRAVENOUS
  Filled 2023-05-17: qty 100

## 2023-05-17 MED ORDER — FAMOTIDINE 20 MG PO TABS: 40.0000 mg | ORAL_TABLET | Freq: Once | ORAL | Status: DC

## 2023-05-17 MED ORDER — SODIUM CHLORIDE 0.9 % IV SOLN: Freq: Once | INTRAVENOUS | Status: AC

## 2023-05-17 NOTE — Progress Notes (Signed)
Hematology and Oncology Follow Up Visit  Raynold Mangas [id number] 06-22-1951 72 y.o. 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 - t(11:14), 13q-, 1p-,1q-  Past Therapy: Status post surgical repair of left femur-07/14/2020 XRT to left shoulder and left hip Xgeva 120 mg subcu every 3 months-next dose in December 2022  Faspro/Velcade/Revlimid/Decadron -- s/p cycle #2 -- start on       08/06/2020  ( Revlimid is 14 on /14 off)    Current Therapy:        ASCT -- [facility] on -01/22/2021 Faspro - q month -- maintenance -- start on 05/13/2021 Zometa 4 mg IV every 3 months-next dose in 08/2023  Radiation therapy to T12, left iliac and left humerus --completed on 10/04/2022 --XRT to right femoral head -start 01/03/2023   Interim History:  Mr. Bou is here today with his wife for follow-up.  He feels well.  However, he had a PET scan that was done recently.  This, unfortunately, it showed that he had a new area in his bones.  The PET scan was done on 05/09/2023.  This showed that he had activity at T9.  We will have to see if Radiation Oncology can see him and see about a course of radiation therapy.  He is doing well with respect to his heart.  He had bypass surgery back in May.  He is gone through this nicely.  He was seen at [facility] by Dr. Jacqulyn Bath yesterday.  Does a long is aware of the PET scan result.  Dr. Jacqulyn Bath would like to have a another bone marrow test done.  Last bone marrow test was done in November.  A 24 urine was done back in June.  This did not show any obvious Bence-Jones protein in his urine.  His last blood studies did not show any increase in his Kappa light chain.  When we saw him back in June, the Kappa light chain was 0.9 mg/dL.  His appetite has been good.  He has had no nausea or vomiting.  He has had no cough or shortness of breath.  He has had no problems with pain.  He has had no change in bowel  or bladder habits.  Overall, I would say that his performance status is probably ECOG 1. . Medications:  Allergies as of 05/17/2023   No Known Allergies      Medication List        Accurate as of May 17, 2023  9:45 AM. If you have any questions, ask your nurse or doctor.          STOP taking these medications    Fe Fum-Vit C-Vit B12-FA Caps capsule Commonly known as: TRIGELS-F FORTE Stopped by: Josph Macho   metoprolol tartrate 25 MG tablet Commonly known as: LOPRESSOR Stopped by: Josph Macho   rosuvastatin 40 MG tablet Commonly known as: CRESTOR Stopped by: Josph Macho       TAKE these medications    ACETAMINOPHEN ER PO Take 1,000 mg by mouth in the morning, at noon, in the evening, and at bedtime.   aspirin EC 81 MG tablet Take 81 mg by mouth at bedtime.   calcium carbonate 1250 (500 Ca) MG tablet Commonly known as: OS-CAL - dosed in mg of elemental calcium Take 1 tablet (500 mg of elemental calcium total) by mouth 2 (two) times daily with a meal.   furosemide 20 MG tablet Commonly  known as: LASIX Take 1 tablet by mouth daily for 3 days, then take daily as needed thereafter for lower extremity edema   levETIRAcetam 500 MG tablet Commonly known as: KEPPRA TAKE 1 TABLET BY MOUTH 2 TIMES A DAY   loperamide 2 MG capsule Commonly known as: IMODIUM Take 1 capsule (2 mg total) by mouth as needed for diarrhea or loose stools.   Magnesium Oxide 140 MG Caps Take 140 mg by mouth daily.   MAGNESIUM PO Take 100 mg by mouth daily.   ondansetron 8 MG tablet Commonly known as: ZOFRAN TAKE 1 TABLET BY MOUTH EVERY 8 HOURS AS NEEDED NAUSEA AND VOMITING What changed:  how much to take how to take this when to take this reasons to take this additional instructions   pantoprazole 40 MG tablet Commonly known as: PROTONIX TAKE 1 TABLET BY MOUTH 2 TIMES A DAY BEFORE MEALS FOR ACID REFLUX   polyethylene glycol powder 17 GM/SCOOP powder Commonly  known as: GLYCOLAX/MIRALAX Mix 17g (1 capful) with 8 oz of favorite beverage and drink every morning   pregabalin 200 MG capsule Commonly known as: LYRICA TAKE 1 CAPSULE BY MOUTH 2 TIMES DAILY   prochlorperazine 10 MG tablet Commonly known as: COMPAZINE Take 1 tablet (10 mg total) by mouth every 6 (six) hours as needed for nausea or vomiting.   saccharomyces boulardii 250 MG capsule Commonly known as: FLORASTOR Take 250 mg by mouth daily.   tamsulosin 0.4 MG Caps capsule Commonly known as: FLOMAX TAKE 1 CAPSULE BY MOUTH EVERY NIGHT AT BEDTIME   traMADol 50 MG tablet Commonly known as: ULTRAM TAKE TWO TABLETS BY MOUTH EVERY 6 HOURS AS NEEDED FOR PAIN        Allergies: No Known Allergies  Past Medical History, Surgical history, Social history, and Family History were reviewed and updated.  Review of Systems: Review of Systems  Constitutional: Negative.   HENT: Negative.    Eyes: Negative.   Respiratory: Negative.    Cardiovascular: Negative.   Gastrointestinal: Negative.   Genitourinary: Negative.   Musculoskeletal:  Positive for joint pain.  Skin: Negative.   Neurological: Negative.   Endo/Heme/Allergies: Negative.   Psychiatric/Behavioral: Negative.       Physical Exam:  height is 5\' 8"  (1.727 m) and weight is 198 lb (89.8 kg). His oral temperature is 97.7 F (36.5 C). His blood pressure is 117/44 (abnormal) and his pulse is 59 (abnormal). His respiration is 18 and oxygen saturation is 98%.   Wt Readings from Last 3 Encounters:  05/17/23 198 lb (89.8 kg)  04/19/23 198 lb (89.8 kg)  04/12/23 196 lb (88.9 kg)    Physical Exam Vitals reviewed.  Constitutional:      Comments: This is a well-developed and well-nourished white male in no obvious distress.  He has a nicely healed median sternotomy scar.  HENT:     Head: Normocephalic and atraumatic.  Eyes:     Pupils: Pupils are equal, round, and reactive to light.  Cardiovascular:     Rate and Rhythm:  Normal rate and regular rhythm.     Heart sounds: Normal heart sounds.  Pulmonary:     Effort: Pulmonary effort is normal.     Breath sounds: Normal breath sounds.  Abdominal:     General: Bowel sounds are normal.     Palpations: Abdomen is soft.  Musculoskeletal:        General: No tenderness or deformity. Normal range of motion.     Cervical back: Normal  range of motion.     Comments: There is pain to palpation in the left hip area.  This is in the lateral hip area.  He has some decreased range of motion.  Lymphadenopathy:     Cervical: No cervical adenopathy.  Skin:    General: Skin is warm and dry.     Findings: No erythema or rash.  Neurological:     Mental Status: He is alert and oriented to person, place, and time.  Psychiatric:        Behavior: Behavior normal.        Thought Content: Thought content normal.        Judgment: Judgment normal.     Lab Results  Component Value Date   WBC 6.4 04/19/2023   HGB 11.0 (L) 04/19/2023   HCT 35.0 (L) 04/19/2023   MCV 98.3 04/19/2023   PLT 142 (L) 04/19/2023   Lab Results  Component Value Date   FERRITIN 938 (H) 02/23/2021   IRON 67 02/23/2021   TIBC 156 (L) 02/23/2021   UIBC 90 (L) 02/23/2021   IRONPCTSAT 43 02/23/2021   Lab Results  Component Value Date   RBC 3.56 (L) 04/19/2023   Lab Results  Component Value Date   KPAFRELGTCHN 8.8 04/19/2023   LAMBDASER 7.3 04/19/2023   KAPLAMBRATIO 5.61 04/19/2023   Lab Results  Component Value Date   IGGSERUM 315 (L) 04/19/2023   IGGSERUM 312 (L) 04/19/2023   IGA 15 (L) 04/19/2023   IGA 16 (L) 04/19/2023   IGMSERUM 9 (L) 04/19/2023   IGMSERUM 8 (L) 04/19/2023   Lab Results  Component Value Date   TOTALPROTELP 5.3 (L) 04/19/2023   ALBUMINELP 3.2 04/19/2023   A1GS 0.3 04/19/2023   A2GS 0.8 04/19/2023   BETS 0.7 04/19/2023   GAMS 0.3 (L) 04/19/2023   MSPIKE 0.1 (H) 04/19/2023     Chemistry      Component Value Date/Time   NA 143 04/19/2023 0850   K 4.6  04/19/2023 0850   CL 108 04/19/2023 0850   CO2 28 04/19/2023 0850   BUN 23 04/19/2023 0850   CREATININE 1.19 04/19/2023 0850      Component Value Date/Time   CALCIUM 9.4 04/19/2023 0850   ALKPHOS 87 04/19/2023 0850   AST 12 (L) 04/19/2023 0850   ALT 14 04/19/2023 0850   BILITOT 0.3 04/19/2023 0850       Impression and Plan: Mr. Gluckman is a very pleasant 72 yo gentleman with 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   We will have to see what the bone marrow biopsy shows.  We will have to see what his cytogenetics show.  I do think that he would be a candidate for CAR-T therapy.  I also think that he would be a candidate for [place] and Kyprolis.  I know there are a lot of options that we can consider for him.  Again, we will see about radiation therapy for the T9 lesion.  Hopefully we can get this started next week.  I am still going to give him Faspro.  I still think this might be worthwhile.  He will get his Zometa today.  We will still plan to get him back in 1 month.  At that time, we may have to make an adjustment with his protocol.    Josph Macho, MD 7/17/20249:45 AM

## 2023-05-17 NOTE — Patient Instructions (Signed)
[facility]  Discharge Instructions: Thank you for choosing [facility] to provide your oncology and hematology care.   If you have a lab appointment with the Cancer Center, please go directly to the Cancer Center and check in at the registration area.  Wear comfortable clothing and clothing appropriate for easy access to any Portacath or PICC line.   We strive to give you quality time with your provider. You may need to reschedule your appointment if you arrive late (15 or more minutes).  Arriving late affects you and other patients whose appointments are after yours.  Also, if you miss three or more appointments without notifying the office, you may be dismissed from the clinic at the provider's discretion.      For prescription refill requests, have your pharmacy contact our office and allow 72 hours for refills to be completed.    Today you received the following chemotherapy and/or immunotherapy agents Darzalex      To help prevent nausea and vomiting after your treatment, we encourage you to take your nausea medication as directed.  BELOW ARE SYMPTOMS THAT SHOULD BE REPORTED IMMEDIATELY: *FEVER GREATER THAN 100.4 F (38 C) OR HIGHER *CHILLS OR SWEATING *NAUSEA AND VOMITING THAT IS NOT CONTROLLED WITH YOUR NAUSEA MEDICATION *UNUSUAL SHORTNESS OF BREATH *UNUSUAL BRUISING OR BLEEDING *URINARY PROBLEMS (pain or burning when urinating, or frequent urination) *BOWEL PROBLEMS (unusual diarrhea, constipation, pain near the anus) TENDERNESS IN MOUTH AND THROAT WITH OR WITHOUT PRESENCE OF ULCERS (sore throat, sores in mouth, or a toothache) UNUSUAL RASH, SWELLING OR PAIN  UNUSUAL VAGINAL DISCHARGE OR ITCHING   Items with * indicate a potential emergency and should be followed up as soon as possible or go to the Emergency Department if any problems should occur.  Please show the CHEMOTHERAPY ALERT CARD or IMMUNOTHERAPY ALERT CARD at check-in  to the Emergency Department and triage nurse. Should you have questions after your visit or need to cancel or reschedule your appointment, please contact [facility]  [phone] and follow the prompts.  Office hours are 8:00 a.m. to 4:30 p.m. Monday - Friday. Please note that voicemails left after 4:00 p.m. may not be returned until the following business day.  We are closed weekends and major holidays. You have access to a nurse at all times for urgent questions. Please call the main number to the clinic [phone] and follow the prompts.  For any non-urgent questions, you may also contact your provider using MyChart. We now offer e-Visits for anyone 18 and older to request care online for non-urgent symptoms. For details visit [email or web address].   Also download the MyChart app! Go to the app store, search "MyChart", open the app, select [facility], and log in with your MyChart username and password.   

## 2023-05-17 NOTE — Progress Notes (Signed)
Labwork done yesterday at [facility].  All labs meet parameters for Treatment today

## 2023-05-18 ENCOUNTER — Other Ambulatory Visit: Payer: Self-pay

## 2023-05-18 ENCOUNTER — Encounter: Payer: Self-pay | Admitting: Hematology & Oncology

## 2023-05-23 NOTE — Progress Notes (Signed)
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 Electronic gates are placed around a cell cluster displaying light scatter properties corresponding to: lymphocytes  Abnormal Cells in gated population: 18% (2% of all cells analysed)  Phenotype of Abnormal Cells: CD5, CD19, Cd20, CD200, Kappa                        Lymphoid Antigens       Myeloid Antigens Miscellaneous CD2  NEG  CD10 NEG  CD11b     ND   CD45 POS CD3  NEG  CD19 POS  CD11c     ND   HLA-Dr    ND CD4  NEG  CD20 POS  CD13 ND   CD34 NEG CD5  POS  CD22 ND   CD14 ND   CD38 NEG CD7  NEG  CD79b     ND   CD15 ND   CD138     ND CD8  NEG  CD103     ND   CD16 ND   TdT  ND CD25 ND   CD200     POS  CD33 ND   CD123     ND TCRab     ND   sKappa    POS  CD64 ND   CD41 ND TCRgd     NEG  sLambda   NEG  CD117     ND   CD61 ND CD56 NEG  cKappa    ND   MPO  ND   CD71 ND CD57 ND   cLambda   ND        [id number]  Location(s) of Symptomatic tumor(s): pathologic fracture of left scapula and left femur  05/09/2023  IMPRESSION: New hypermetabolic lytic lesion in the anterior T9 vertebral body, suggesting active myeloma.   Prior hypermetabolic lesion in the right femoral head has resolved. Additional scattered lytic and sclerotic lesions in the visualized axial and appendicular skeleton, non FDG avid.   Radiation changes in the left upper lobe with adjacent mild focal hypermetabolism medially, indeterminate. Attention on follow-up is suggested.   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    We will have to see what the bone marrow biopsy shows.  We will have to see what his cytogenetics show.   I do think that he would be a candidate for CAR-T therapy.   I also think that he would be a candidate for [place] and Kyprolis.   I know there are a lot of options that we can consider for him.   Again, we will see about radiation therapy for the T9 lesion.  Hopefully we can get this started next week.   I am still going to give him Faspro.  I still think this might be worthwhile.   He will get his Zometa today.   We will still plan to get him back in 1 month.  At that time, we may  have to make an adjustment with his protocol.  Patient's main complaints related to symptomatic tumor(s) are:  There is pain to palpation in the left hip area.  This is in the lateral hip area.  He has some decreased range of motion.   Pain on a scale of 0-10 is: Pt c/o 7/10 left hip pain, he has had this pain since a hip stabilizer was placed.   If Spine Met(s), symptoms, if any, include: Bowel/Bladder retention or incontinence (please describe): takes flomax  Numbness or weakness in extremities (please describe): none Current Decadron regimen, if applicable: none  Ambulatory status? Walker? Wheelchair?: Uses a cane for ambulation  SAFETY ISSUES: Prior radiation? Third of fourth time getting radiation Pacemaker/ICD? None to report Possible current pregnancy? no Is the patient on methotrexate? no  Additional Complaints / other details:  None at this time. Pt has had triple bypass and three reoccurrences of cancer in 9 months per family report. His wife was treated for lung cancer as well in the past.

## 2023-05-26 ENCOUNTER — Encounter: Payer: Self-pay | Admitting: Radiation Oncology

## 2023-05-26 ENCOUNTER — Telehealth: Payer: Self-pay

## 2023-05-26 NOTE — Telephone Encounter (Signed)
RN called pt for meaningful use and nurse evaluation information. Note completed and routed to Dr. Roselind Messier.

## 2023-05-28 NOTE — Progress Notes (Signed)
Radiation Oncology         [phone] ________________________________  Outpatient Re-Consultation  Name: Bruce Smith MRN: [id number]  Date: 05/29/2023  DOB: 02/22/1951  NF:AOZHYQ, Ellin Mayhew., MD  Josph Macho, MD   REFERRING PHYSICIAN: Josph Macho, MD  DIAGNOSIS: {There were no encounter diagnoses. (Refresh or delete this SmartLink)}  The primary encounter diagnosis was Malignant neoplasm metastatic to bone [facility]). A diagnosis of Kappa light chain myeloma (HCC) was also pertinent to this visit.   Kappa light chain myeloma initially diagnosed in 2021 with extensive bone disease and pathologic fracture of the left scapula and left femur - s/p radiation. Hypermetabolic lesion in the right femoral head compatible with active myelomatous involvement in February 2024-  s/p radiation.  New hypermetabolic lytic lesion in the anterior T9 vertebral body suggestive of active myeloma (July 2024)- non biopsy proven   Interval Since Last Radiation: 4 months and 11 days    3) Intent: Palliative Radiation Treatment Dates: 01/05/2023 through 01/16/2023 Site Technique Total Dose (Gy) Dose per Fx (Gy) Completed Fx Beam Energies  Femur Right: Ext_R 3D 24/24 3 8/8 10X   2) Intent: Curative Radiation Treatment Dates: 09/19/2022 through 10/04/2022 Site Technique Total Dose (Gy) Dose per Fx (Gy) Completed Fx Beam Energies  Humerus, Left: Ext_L Complex 25/25 2.5 10/10 10X  Thoracic Spine: Spine 3D 25/25 2.5 10/10 15X  Ilium, Left: Pelvis_L Complex 25/25 2.5 10/10 15X   1) Radiation Treatment Dates: 07/28/2020 through 08/10/2020 Site Technique Total Dose (Gy) Dose per Fx (Gy) Completed Fx Beam Energies  Scapula, Left: Chest_Lt 3D 30/30 3 10/10 10X, 15X  Sacro-Iliac Joints: Pelvis 3D 30/30 3 10/10 10X, 15X  Femur Left: Ext_Lt Complex 30/30 3 10/10 10X, 15X   HISTORY OF PRESENT ILLNESS::Bruce Smith is a 72 y.o. male who is accompanied by ***. he returns today as a Research officer, political party of Dr. Myna Hidalgo for  re-evaluation and an opinion concerning radiation therapy as part of management for his new T9 lesion suspicious for active myeloma. The patient was last seen here on 02/16/23 for follow-up of radiation to the disease involvement in the right femur. He has continued to meet with Dr. Myna Hidalgo and receive daratumumab. He has also continued to receive Zometa q3 months.   To review from his last visit, the patient reported a new onset of painful lower back pain. Given his history, we discussed proceeding with an MRI of the lumbar spine to further evaluate for disease spread or a potential disc problem.   Accordingly, the patient presented for an MRI of the lumbar spine on 03/04/23 which demonstrated slight interval progression (since March 2023) in the multiple foci of abnormal marrow signaling consistent with the patient's history of chronic multiple myeloma. Lesions with a slight increase in size included a T12 vertebral body lesion measuring 15 mm, previously 10 mm, and several smaller foci throughout the lower thoracic, lumbar and sacral region with a mild increase in size and signaling. MRI also showed; stability of the chronic pathologic fracture at L2;  broad-based herniation of disc material with caudal migration in the left lateral recess behind L3; a slight increase in multifactorial spinal stenosis at the level of L2-3 (left greater than right); stable mild stenosis of the right lateral recess of L3-4 and intervertebral foramen on the right of L3-4 without definite neural compression; and stable mild narrowing of the right lateral recess of L4-5 likely without neural Compression. Endplate osteophytes and bulging of the discs at the levels of L3-4 and  L4-5 were also appreciated.   The patient recently presented for a restaging PET scan on 05/09/23 which revealed a new hypermetabolic lytic lesion in the anterior T9 vertebral body, suggestive of active myeloma. PET otherwise showed: resolution of the  prior hypermetabolic lesion in the right femoral head; no FDG avidity associated with the additional scattered lytic and sclerotic lesions in the visualized axial and appendicular skeleton; and radiation changes in the LUL with indeterminate adjacent mild focal hypermetabolism medially.  Accordingly, the patient followed up with Dr. Myna Hidalgo on 05/17/23 to discuss PET findings. Dr. Myna Hidalgo ultimately recommends a course of radiation therapy to the new hypermetabolic lytic lesion at T9. The patient will continue with Faspro and Zometa in the time being. Dr. Myna Hidalgo may consider changing his protocol pending further work-up and reassessment in 1 month. Dr. Myna Hidalgo also reviewed his most recent blood studies at that time which did not show any increase in his Kappa light chain. Additional labs also showed no Bence-Jones protein in his urine.  He was also seen by Dr. Jacqulyn Bath at [facility] on 05/16/23 who recommends proceeding with a bone marrow aspirate and biopsy for restaging of his disease. If biopsies confirm marrow disease, Dr. Jacqulyn Bath recommends transitioning to a different systemic therapy. If pathology is negative, Dr. Jacqulyn Bath recommends proceeding with radiation to the T9 lesion followed by observation (vs potential systemic therapy which would also be indicated per Dr. Jacqulyn Bath).   Of note: The patient hospitalized from 03/08/23 through 03/22/23 for management of NSTEMI. He is now s/p CABG x 3 which was performed on 03/13/23. He is participating in cardiac rehab and is followed closely by cardiology.    PAST MEDICAL HISTORY:  Past Medical History:  Diagnosis Date   Atherosclerosis    Coronary artery disease    Goals of care, counseling/discussion 06/29/2020   History of radiation therapy    Left Humerus, T-spine, Pelvis- 09/19/22-10/04/22- Dr. Antony Blackbird   History of radiation therapy    Right Femur- 01/15/23-01/16/23- Dr. 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   Multiple myeloma (HCC) 07/21/2020   Myocardial infarction [facility])     PAST SURGICAL HISTORY: Past Surgical History:  Procedure Laterality Date   ANTERIOR CERVICAL DECOMP/DISCECTOMY FUSION N/A 06/17/2020   Procedure: ANTERIOR CERVICAL DECOMPRESSION/DISCECTOMY FUSION, INTERBODY PROSTHESIS, PLATE/SCREWS CERVICAL FIVE- CERVICAL SIX;  Surgeon: Tressie Stalker, MD;  Location: [facility] OR;  Service: Neurosurgery;  Laterality: N/A;  ANTERIOR CERVICAL DECOMPRESSION/DISCECTOMY FUSION, INTERBODY PROSTHESIS, PLATE/SCREWS CERVICAL FIVE- CERVICAL SIX   BIOPSY  02/24/2021   Procedure: BIOPSY;  Surgeon: Kathi Der, MD;  Location: [facility] ENDOSCOPY;  Service: Gastroenterology;;   CARDIAC CATHETERIZATION     CORONARY ARTERY BYPASS GRAFT N/A 03/13/2023   Procedure: CORONARY ARTERY BYPASS GRAFTING (CABG) X3, USING RIGHT LEG GREATER SAPHENOUS VEIN HARVESTED ENDOSCOPICALLY & LEFT IMA;  Surgeon: Alleen Borne, MD;  Location: [facility] OR;  Service: Open Heart Surgery;  Laterality: N/A;   ESOPHAGOGASTRODUODENOSCOPY N/A 02/24/2021   Procedure: ESOPHAGOGASTRODUODENOSCOPY (EGD);  Surgeon: Kathi Der, MD;  Location: [facility];  Service: Gastroenterology;  Laterality: N/A;   FEMUR IM NAIL Left 07/14/2020   Procedure: OPEN REDUCTION INTERNAL FIXATION (ORIF LEFT FEMORAL NAIL FRACTURE;  Surgeon: Teryl Lucy, MD;  Location: [facility] OR;  Service: Orthopedics;  Laterality: Left;   LEFT HEART CATH AND CORONARY ANGIOGRAPHY N/A 03/09/2023   Procedure: LEFT HEART CATH AND CORONARY ANGIOGRAPHY;  Surgeon: Runell Gess,  MD;  Location: [facility] INVASIVE CV LAB;  Service: Cardiovascular;  Laterality: N/A;   TEE WITHOUT CARDIOVERSION N/A 03/13/2023   Procedure: TRANSESOPHAGEAL ECHOCARDIOGRAM;  Surgeon: Alleen Borne, MD;  Location: [facility] OR;  Service: Open Heart Surgery;  Laterality: N/A;    FAMILY HISTORY:  Family History  Problem Relation Age of Onset   Stroke  Mother    Prostate cancer Paternal Grandfather     SOCIAL HISTORY:  Social History   Tobacco Use   Smoking status: Former    Current packs/day: 0.00    Average packs/day: 1 pack/day for 15.0 years (15.0 ttl pk-yrs)    Types: Cigarettes    Start date: 11/01/1983    Quit date: 10/31/1998    Years since quitting: 24.5   Smokeless tobacco: Current    Types: Chew  Vaping Use   Vaping status: Never Used  Substance Use Topics   Alcohol use: Not Currently    Comment: rare   Drug use: Never    ALLERGIES: No Known Allergies  MEDICATIONS:  Current Outpatient Medications  Medication Sig Dispense Refill   ACETAMINOPHEN ER PO Take 1,000 mg by mouth in the morning, at noon, in the evening, and at bedtime.     aspirin EC 81 MG tablet Take 81 mg by mouth at bedtime.     calcium carbonate (OS-CAL - DOSED IN MG OF ELEMENTAL CALCIUM) 1250 (500 Ca) MG tablet Take 1 tablet (500 mg of elemental calcium total) by mouth 2 (two) times daily with a meal. 60 tablet 0   furosemide (LASIX) 20 MG tablet Take 1 tablet by mouth daily for 3 days, then take daily as needed thereafter for lower extremity edema 30 tablet 3   levETIRAcetam (KEPPRA) 500 MG tablet TAKE 1 TABLET BY MOUTH 2 TIMES A DAY 60 tablet 3   loperamide (IMODIUM) 2 MG capsule Take 1 capsule (2 mg total) by mouth as needed for diarrhea or loose stools. 30 capsule 0   Magnesium Oxide 140 MG CAPS Take 140 mg by mouth daily.     MAGNESIUM PO Take 100 mg by mouth daily.     ondansetron (ZOFRAN) 8 MG tablet TAKE 1 TABLET BY MOUTH EVERY 8 HOURS AS NEEDED NAUSEA AND VOMITING (Patient taking differently: Take 8 mg by mouth every 8 (eight) hours as needed for nausea or vomiting.) 180 tablet 0   pantoprazole (PROTONIX) 40 MG tablet TAKE 1 TABLET BY MOUTH 2 TIMES A DAY BEFORE MEALS FOR ACID REFLUX 180 tablet 0   polyethylene glycol powder (GLYCOLAX/MIRALAX) 17 GM/SCOOP powder Mix 17g (1 capful) with 8 oz of favorite beverage and drink every morning      pregabalin (LYRICA) 200 MG capsule TAKE 1 CAPSULE BY MOUTH 2 TIMES DAILY 180 capsule 0   prochlorperazine (COMPAZINE) 10 MG tablet Take 1 tablet (10 mg total) by mouth every 6 (six) hours as needed for nausea or vomiting. 30 tablet 3   saccharomyces boulardii (FLORASTOR) 250 MG capsule Take 250 mg by mouth daily.     tamsulosin (FLOMAX) 0.4 MG CAPS capsule TAKE 1 CAPSULE BY MOUTH EVERY NIGHT AT BEDTIME 90 capsule 0   traMADol (ULTRAM) 50 MG tablet TAKE TWO TABLETS BY MOUTH EVERY 6 HOURS AS NEEDED FOR PAIN 240 tablet 0   No current facility-administered medications for this encounter.    REVIEW OF SYSTEMS:  A 10+ POINT REVIEW OF SYSTEMS WAS OBTAINED including neurology, dermatology, psychiatry, cardiac, respiratory, lymph, extremities, GI, GU, musculoskeletal, constitutional, reproductive, HEENT. ***  PHYSICAL EXAM:  vitals were not taken for this visit.   General: Alert and oriented, in no acute distress HEENT: Head is normocephalic. Extraocular movements are intact. Oropharynx is clear. Neck: Neck is supple, no palpable cervical or supraclavicular lymphadenopathy. Heart: Regular in rate and rhythm with no murmurs, rubs, or gallops. Chest: Clear to auscultation bilaterally, with no rhonchi, wheezes, or rales. Abdomen: Soft, nontender, nondistended, with no rigidity or guarding. Extremities: No cyanosis or edema. Lymphatics: see Neck Exam Skin: No concerning lesions. Musculoskeletal: symmetric strength and muscle tone throughout. Neurologic: Cranial nerves II through XII are grossly intact. No obvious focalities. Speech is fluent. Coordination is intact. Psychiatric: Judgment and insight are intact. Affect is appropriate. ***  ECOG = ***  0 - Asymptomatic (Fully active, able to carry on all predisease activities without restriction)  1 - Symptomatic but completely ambulatory (Restricted in physically strenuous activity but ambulatory and able to carry out work of a light or sedentary  nature. For example, light housework, office work)  2 - Symptomatic, <50% in bed during the day (Ambulatory and capable of all self care but unable to carry out any work activities. Up and about more than 50% of waking hours)  3 - Symptomatic, >50% in bed, but not bedbound (Capable of only limited self-care, confined to bed or chair 50% or more of waking hours)  4 - Bedbound (Completely disabled. Cannot carry on any self-care. Totally confined to bed or chair)  5 - Death   Santiago Glad MM, Creech RH, Tormey DC, et al. [phone]). "Toxicity and response criteria of the [facility]". Am. Evlyn Clines. Oncol. 5 (6): 649-55  LABORATORY DATA:  Lab Results  Component Value Date   WBC 6.4 04/19/2023   HGB 11.0 (L) 04/19/2023   HCT 35.0 (L) 04/19/2023   MCV 98.3 04/19/2023   PLT 142 (L) 04/19/2023   NEUTROABS 4.7 04/19/2023   Lab Results  Component Value Date   NA 143 04/19/2023   K 4.6 04/19/2023   CL 108 04/19/2023   CO2 28 04/19/2023   GLUCOSE 117 (H) 04/19/2023   BUN 23 04/19/2023   CREATININE 1.19 04/19/2023   CALCIUM 9.4 04/19/2023      RADIOGRAPHY: NM PET Image Restage (PS) Whole Body  Result Date: 05/15/2023 CLINICAL DATA:  Subsequent treatment strategy for multiple myeloma. EXAM: NUCLEAR MEDICINE PET WHOLE BODY TECHNIQUE: 10.0 mCi F-18 FDG was injected intravenously. Full-ring PET imaging was performed from the head to foot after the radiotracer. CT data was obtained and used for attenuation correction and anatomic localization. Fasting blood glucose: 112 mg/dl COMPARISON:  PET-CT dated 12/12/2022. FINDINGS: Mediastinal blood pool activity: SUV max 2.3 HEAD/NECK: No hypermetabolic activity in the scalp. No hypermetabolic cervical lymph nodes. Incidental CT findings: none CHEST: Platelike scarring/radiation changes in the left upper lobe. Very mild focal hypermetabolism in the medial left upper lobe, max SUV 3.6, although without CT correlate. No hypermetabolic thoracic  lymphadenopathy. Incidental CT findings: Atherosclerotic calcifications of the aortic arch. Severe three-vessel coronary atherosclerosis. Postsurgical changes related to prior CABG. Median sternotomy. ABDOMEN/PELVIS: No abnormal hypermetabolism in the liver, spleen, pancreas, or adrenal glands. No hypermetabolic abdominopelvic lymphadenopathy. Incidental CT findings: Atherosclerotic calcifications abdominal aorta and branch vessels. Prostatomegaly. SKELETON: Focal hypermetabolism along the anterior aspect of the T9 vertebral body, max SUV 5.2, with mild corresponding lucencies on CT (series 4/image 118). This is new from the prior. Prior focal hypermetabolism in the right femoral head is no longer evident. Scattered lytic and sclerotic lesions in  the visualized axial and appendicular skeleton, non FDG avid. Incidental CT findings: Old/healed left scapular fracture. Degenerative changes the visualized thoracolumbar spine. Status post ORIF of the left femur. EXTREMITIES: No abnormal hypermetabolic activity in the lower extremities. Incidental CT findings: none IMPRESSION: New hypermetabolic lytic lesion in the anterior T9 vertebral body, suggesting active myeloma. Prior hypermetabolic lesion in the right femoral head has resolved. Additional scattered lytic and sclerotic lesions in the visualized axial and appendicular skeleton, non FDG avid. Radiation changes in the left upper lobe with adjacent mild focal hypermetabolism medially, indeterminate. Attention on follow-up is suggested. Electronically Signed   By: Charline Bills M.D.   On: 05/15/2023 04:21      IMPRESSION: Kappa light chain myeloma initially diagnosed in 2021 with extensive bone disease and pathologic fracture of the left scapula and left femur - s/p radiation. Hypermetabolic lesion in the right femoral head compatible with active myelomatous involvement in February 2024-  s/p radiation.  New hypermetabolic lytic lesion in the anterior T9  vertebral body suggestive of active myeloma (July 2024)- non biopsy proven   ***  Today, I talked to the patient and family about the findings and work-up thus far.  We discussed the natural history of *** and general treatment, highlighting the role of radiotherapy in the management.  We discussed the available radiation techniques, and focused on the details of logistics and delivery.  We reviewed the anticipated acute and late sequelae associated with radiation in this setting.  The patient was encouraged to ask questions that I answered to the best of my ability. *** A patient consent form was discussed and signed.  We retained a copy for our records.  The patient would like to proceed with radiation and will be scheduled for CT simulation.  PLAN: ***    *** minutes of total time was spent for this patient encounter, including preparation, face-to-face counseling with the patient and coordination of care, physical exam, and documentation of the encounter.   ------------------------------------------------  Billie Lade, PhD, MD  This document serves as a record of services personally performed by Antony Blackbird, MD. It was created on his behalf by Neena Rhymes, a trained medical scribe. The creation of this record is based on the scribe's personal observations and the provider's statements to them. This document has been checked and approved by the attending provider.

## 2023-05-29 ENCOUNTER — Ambulatory Visit
Admission: RE | Admit: 2023-05-29 | Discharge: 2023-05-29 | Disposition: A | Discharge status: Home / Self Care | Payer: Medicare Other | Source: Ambulatory Visit | Attending: Radiation Oncology | Admitting: Radiation Oncology

## 2023-05-29 ENCOUNTER — Other Ambulatory Visit: Payer: Self-pay

## 2023-05-29 ENCOUNTER — Ambulatory Visit: Admission: RE | Admit: 2023-05-29 | Payer: Medicare Other | Source: Ambulatory Visit | Admitting: Radiation Oncology

## 2023-05-29 VITALS — BP 132/74 | HR 56 | Temp 97.3°F | Resp 18 | Ht 68.0 in | Wt 203.5 lb

## 2023-05-29 DIAGNOSIS — C9000 Multiple myeloma not having achieved remission: Secondary | ICD-10-CM | POA: Diagnosis present

## 2023-05-29 DIAGNOSIS — C7951 Secondary malignant neoplasm of bone: Secondary | ICD-10-CM | POA: Insufficient documentation

## 2023-05-30 ENCOUNTER — Other Ambulatory Visit: Payer: Self-pay

## 2023-05-31 ENCOUNTER — Telehealth: Payer: Self-pay | Admitting: Radiation Oncology

## 2023-05-31 NOTE — Telephone Encounter (Signed)
Pt's wife called asking about moving pt's upcoming 8/9 appt due to schedule conflict. Call was transferred to RTT support.

## 2023-06-01 DIAGNOSIS — C9000 Multiple myeloma not having achieved remission: Secondary | ICD-10-CM | POA: Diagnosis present

## 2023-06-01 DIAGNOSIS — Z510 Encounter for antineoplastic radiation therapy: Secondary | ICD-10-CM | POA: Diagnosis not present

## 2023-06-02 ENCOUNTER — Other Ambulatory Visit: Payer: Self-pay | Admitting: Radiology

## 2023-06-02 DIAGNOSIS — C9000 Multiple myeloma not having achieved remission: Secondary | ICD-10-CM

## 2023-06-04 NOTE — H&P (Signed)
Referring Physician(s): Ennever,Peter R  Supervising Physician: Oley Balm  Patient Status:  [facility]  Chief Complaint: "I'm getting a bone marrow biopsy"   Subjective: Pt known to IR team from left scapula bone lesion bx in 2021, BM bx 2021 x2, 2022 and 2023. He is a 72 yo male with hx recurrent multiple myeloma and presents again today for CT guided BM bx to r/o marrow involvement. Additional med hx as below.   Past Medical History:  Diagnosis Date   Atherosclerosis    Coronary artery disease    Goals of care, counseling/discussion 06/29/2020   History of radiation therapy    Left Humerus, T-spine, Pelvis- 09/19/22-10/04/22- Dr. Antony Blackbird   History of radiation therapy    Right Femur- 01/15/23-01/16/23- Dr. 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   Multiple myeloma (HCC) 07/21/2020   Myocardial infarction [facility])    Past Surgical History:  Procedure Laterality Date   ANTERIOR CERVICAL DECOMP/DISCECTOMY FUSION N/A 06/17/2020   Procedure: ANTERIOR CERVICAL DECOMPRESSION/DISCECTOMY FUSION, INTERBODY PROSTHESIS, PLATE/SCREWS CERVICAL FIVE- CERVICAL SIX;  Surgeon: Tressie Stalker, MD;  Location: [facility] OR;  Service: Neurosurgery;  Laterality: N/A;  ANTERIOR CERVICAL DECOMPRESSION/DISCECTOMY FUSION, INTERBODY PROSTHESIS, PLATE/SCREWS CERVICAL FIVE- CERVICAL SIX   BIOPSY  02/24/2021   Procedure: BIOPSY;  Surgeon: Kathi Der, MD;  Location: [facility] ENDOSCOPY;  Service: Gastroenterology;;   CARDIAC CATHETERIZATION     CORONARY ARTERY BYPASS GRAFT N/A 03/13/2023   Procedure: CORONARY ARTERY BYPASS GRAFTING (CABG) X3, USING RIGHT LEG GREATER SAPHENOUS VEIN HARVESTED ENDOSCOPICALLY & LEFT IMA;  Surgeon: Alleen Borne, MD;  Location: [facility] OR;  Service: Open Heart Surgery;  Laterality: N/A;   ESOPHAGOGASTRODUODENOSCOPY N/A 02/24/2021   Procedure:  ESOPHAGOGASTRODUODENOSCOPY (EGD);  Surgeon: Kathi Der, MD;  Location: [facility]COPY;  Service: Gastroenterology;  Laterality: N/A;   FEMUR IM NAIL Left 07/14/2020   Procedure: OPEN REDUCTION INTERNAL FIXATION (ORIF LEFT FEMORAL NAIL FRACTURE;  Surgeon: Teryl Lucy, MD;  Location: [facility] OR;  Service: Orthopedics;  Laterality: Left;   LEFT HEART CATH AND CORONARY ANGIOGRAPHY N/A 03/09/2023   Procedure: LEFT HEART CATH AND CORONARY ANGIOGRAPHY;  Surgeon: Runell Gess, MD;  Location: [facility] INVASIVE CV LAB;  Service: Cardiovascular;  Laterality: N/A;   TEE WITHOUT CARDIOVERSION N/A 03/13/2023   Procedure: TRANSESOPHAGEAL ECHOCARDIOGRAM;  Surgeon: Alleen Borne, MD;  Location: [facility] OR;  Service: Open Heart Surgery;  Laterality: N/A;      Allergies: Patient has no known allergies.  Medications: Prior to Admission medications   Medication Sig Start Date End Date Taking? Authorizing Provider  ACETAMINOPHEN ER PO Take 1,000 mg by mouth in the morning, at noon, in the evening, and at bedtime.    [provider]  aspirin EC 81 MG tablet Take 81 mg by mouth at bedtime.    [provider]  calcium carbonate (OS-CAL - DOSED IN MG OF ELEMENTAL CALCIUM) 1250 (500 Ca) MG tablet Take 1 tablet (500 mg of elemental calcium total) by mouth 2 (two) times daily with a meal. 03/17/21   Love, Evlyn Kanner, PA-C  furosemide (LASIX) 20 MG tablet Take 1 tablet by mouth daily for 3 days, then take daily as needed thereafter for lower extremity edema 04/03/23   Sharlene Dory, PA-C  levETIRAcetam (KEPPRA) 500 MG tablet TAKE 1 TABLET BY MOUTH 2 TIMES A DAY 02/07/23   Josph Macho, MD  loperamide (  IMODIUM) 2 MG capsule Take 1 capsule (2 mg total) by mouth as needed for diarrhea or loose stools. 03/17/21   Love, Evlyn Kanner, PA-C  Magnesium Oxide 140 MG CAPS Take 140 mg by mouth daily.    [provider]  MAGNESIUM PO Take 100 mg by mouth daily. 03/01/22   [provider]  ondansetron (ZOFRAN) 8  MG tablet TAKE 1 TABLET BY MOUTH EVERY 8 HOURS AS NEEDED NAUSEA AND VOMITING Patient taking differently: Take 8 mg by mouth every 8 (eight) hours as needed for nausea or vomiting. 09/28/22   Antony Blackbird, MD  pantoprazole (PROTONIX) 40 MG tablet TAKE 1 TABLET BY MOUTH 2 TIMES A DAY BEFORE MEALS FOR ACID REFLUX 05/01/23   Josph Macho, MD  polyethylene glycol powder (GLYCOLAX/MIRALAX) 17 GM/SCOOP powder Mix 17g (1 capful) with 8 oz of favorite beverage and drink every morning    [provider]  pregabalin (LYRICA) 200 MG capsule TAKE 1 CAPSULE BY MOUTH 2 TIMES DAILY 02/21/23   Josph Macho, MD  prochlorperazine (COMPAZINE) 10 MG tablet Take 1 tablet (10 mg total) by mouth every 6 (six) hours as needed for nausea or vomiting. 10/05/22   Josph Macho, MD  saccharomyces boulardii (FLORASTOR) 250 MG capsule Take 250 mg by mouth daily. 03/17/21   [provider]  tamsulosin (FLOMAX) 0.4 MG CAPS capsule TAKE 1 CAPSULE BY MOUTH EVERY NIGHT AT BEDTIME 03/30/23   Josph Macho, MD  traMADol (ULTRAM) 50 MG tablet TAKE TWO TABLETS BY MOUTH EVERY 6 HOURS AS NEEDED FOR PAIN 05/08/23   Josph Macho, MD     Vital Signs;    Code Status:   Physical Exam  Imaging: No results found.  Labs:  CBC: Recent Labs    03/19/23 0139 03/21/23 0147 03/22/23 0147 04/19/23 0850  WBC 11.0* 4.7 3.8* 6.4  HGB 8.3* 8.7* 9.1* 11.0*  HCT 25.0* 26.5* 27.5* 35.0*  PLT 94* 98* 89* 142*    COAGS: Recent Labs    03/09/23 0304 03/13/23 1244  INR 1.0 1.2  APTT  --  35    BMP: Recent Labs    03/19/23 0139 03/21/23 0147 03/22/23 0147 04/19/23 0850  NA 133* 134* 134* 143  K 3.7 3.6 4.0 4.6  CL 102 104 105 108  CO2 23 23 24 28   GLUCOSE 107* 109* 111* 117*  BUN 17 13 12 23   CALCIUM 7.7* 7.7* 7.9* 9.4  CREATININE 0.98 0.95 0.87 1.19  GFRNONAA >60 >60 >60 >60    LIVER FUNCTION TESTS: Recent Labs    11/29/22 0842 12/27/22 0900 02/22/23 0858 04/19/23 0850  BILITOT 0.3  0.4 0.4 0.3  AST 15 14* 17 12*  ALT 24 17 19 14   ALKPHOS 81 75 65 87  PROT 5.6* 5.9* 5.6* 5.6*  ALBUMIN 4.0 4.3 3.9 3.9    Assessment and Plan: 72 yo male with PMH sig for CAD with prior MI/CABG, HLD, HTN who presents now with recurrent multiple myeloma; scheduled today for CT guided bone marrow bx to r/o marrow involvement.Risks and benefits of procedure was discussed with the patient and/or patient's family including, but not limited to bleeding, infection, damage to adjacent structures or low yield requiring additional tests.  All of the questions were answered and there is agreement to proceed.  Consent signed and in chart.    Electronically Signed: D. Jeananne Rama, PA-C 06/04/2023, 2:30 PM   I spent a total of 20 minutes at the the patient's bedside  AND on the patient's hospital floor or unit, greater than 50% of which was counseling/coordinating care for CT guided bone marrow biopsy

## 2023-06-05 ENCOUNTER — Ambulatory Visit (HOSPITAL_COMMUNITY)
Admission: RE | Admit: 2023-06-05 | Discharge: 2023-06-05 | Disposition: A | Discharge status: Home / Self Care | Payer: Medicare Other | Source: Ambulatory Visit | Attending: Hematology & Oncology | Admitting: Hematology & Oncology

## 2023-06-05 ENCOUNTER — Other Ambulatory Visit: Payer: Self-pay

## 2023-06-05 ENCOUNTER — Encounter (HOSPITAL_COMMUNITY): Payer: Self-pay

## 2023-06-05 DIAGNOSIS — E785 Hyperlipidemia, unspecified: Secondary | ICD-10-CM | POA: Insufficient documentation

## 2023-06-05 DIAGNOSIS — Z951 Presence of aortocoronary bypass graft: Secondary | ICD-10-CM | POA: Insufficient documentation

## 2023-06-05 DIAGNOSIS — D649 Anemia, unspecified: Secondary | ICD-10-CM | POA: Diagnosis not present

## 2023-06-05 DIAGNOSIS — I10 Essential (primary) hypertension: Secondary | ICD-10-CM | POA: Diagnosis not present

## 2023-06-05 DIAGNOSIS — C9000 Multiple myeloma not having achieved remission: Secondary | ICD-10-CM | POA: Diagnosis present

## 2023-06-05 DIAGNOSIS — I252 Old myocardial infarction: Secondary | ICD-10-CM | POA: Insufficient documentation

## 2023-06-05 DIAGNOSIS — D696 Thrombocytopenia, unspecified: Secondary | ICD-10-CM | POA: Insufficient documentation

## 2023-06-05 DIAGNOSIS — I2510 Atherosclerotic heart disease of native coronary artery without angina pectoris: Secondary | ICD-10-CM | POA: Diagnosis not present

## 2023-06-05 LAB — CBC WITH DIFFERENTIAL/PLATELET
Abs Immature Granulocytes: 0.03 10*3/uL (ref 0.00–0.07)
Basophils Absolute: 0 10*3/uL (ref 0.0–0.1)
Basophils Relative: 0 %
Eosinophils Absolute: 0.1 10*3/uL (ref 0.0–0.5)
Eosinophils Relative: 2 %
HCT: 36.8 % — ABNORMAL LOW (ref 39.0–52.0)
Hemoglobin: 11.8 g/dL — ABNORMAL LOW (ref 13.0–17.0)
Immature Granulocytes: 1 %
Lymphocytes Relative: 11 %
Lymphs Abs: 0.6 10*3/uL — ABNORMAL LOW (ref 0.7–4.0)
MCH: 30.2 pg (ref 26.0–34.0)
MCHC: 32.1 g/dL (ref 30.0–36.0)
MCV: 94.1 fL (ref 80.0–100.0)
Monocytes Absolute: 0.9 10*3/uL (ref 0.1–1.0)
Monocytes Relative: 16 %
Neutro Abs: 4 10*3/uL (ref 1.7–7.7)
Neutrophils Relative %: 70 %
Platelets: 125 10*3/uL — ABNORMAL LOW (ref 150–400)
RBC: 3.91 MIL/uL — ABNORMAL LOW (ref 4.22–5.81)
RDW: 14.1 % (ref 11.5–15.5)
WBC: 5.7 10*3/uL (ref 4.0–10.5)
nRBC: 0 % (ref 0.0–0.2)

## 2023-06-05 MED ORDER — MIDAZOLAM HCL 2 MG/2ML IJ SOLN
INTRAMUSCULAR | Status: AC | PRN
  Administered 2023-06-05: 1 mg via INTRAVENOUS
  Administered 2023-06-05 (×2): .5 mg via INTRAVENOUS

## 2023-06-05 MED ORDER — SODIUM CHLORIDE 0.9 % IV SOLN: INTRAVENOUS | Status: DC

## 2023-06-05 MED ORDER — MIDAZOLAM HCL 2 MG/2ML IJ SOLN
INTRAMUSCULAR | Status: AC
  Filled 2023-06-05: qty 4

## 2023-06-05 MED ORDER — HYDROCODONE-ACETAMINOPHEN 5-325 MG PO TABS: 1.0000 | ORAL_TABLET | ORAL | Status: DC | PRN

## 2023-06-05 MED ORDER — FENTANYL CITRATE (PF) 100 MCG/2ML IJ SOLN
INTRAMUSCULAR | Status: AC | PRN
  Administered 2023-06-05 (×2): 50 ug via INTRAVENOUS

## 2023-06-05 MED ORDER — FENTANYL CITRATE (PF) 100 MCG/2ML IJ SOLN
INTRAMUSCULAR | Status: AC
  Filled 2023-06-05: qty 2

## 2023-06-05 NOTE — Discharge Instructions (Addendum)
Bone Marrow Aspiration and Bone Marrow Biopsy, Adult, Care After  The following information offers guidance on how to care for yourself after your procedure. Your health care provider may also give you more specific instructions. If you have problems or questions, contact your health care provider.  What can I expect after the procedure?  May remove dressing or bandaid and shower tomorrow.  Keep site clean and dry. Replace with clean dressing or bandaid as necessary. Urgent needs IR clinic [phone] (mon-fri 8-5).  After the procedure, it is common to have: Mild pain and tenderness. Swelling. Bruising. Follow these instructions at home: Incision care  Follow instructions from your health care provider about how to take care of the incision site. Make sure you: Wash your hands with soap and water for at least 20 seconds before and after you change your bandage (dressing). If soap and water are not available, use hand sanitizer. Change your dressing as told by your health care provider. Leave stitches (sutures), skin glue, or adhesive strips in place. These skin closures may need to stay in place for 2 weeks or longer. If adhesive strip edges start to loosen and curl up, you may trim the loose edges. Do not remove adhesive strips completely unless your health care provider tells you to do that. Check your incision site every day for signs of infection. Check for: More redness, swelling, or pain. Fluid or blood. Warmth. Pus or a bad smell. Activity Return to your normal activities as told by your health care provider. Ask your health care provider what activities are safe for you. Do not lift anything that is heavier than 10 lb (4.5 kg), or the limit that you are told, until your health care provider says that it is safe. If you were given a sedative during the procedure, it can affect you for  several hours. Do not drive or operate machinery until your health care provider says that it is safe. General instructions  Take over-the-counter and prescription medicines only as told by your health care provider. Do not take baths, swim, or use a hot tub until your health care provider approves. Ask your health care provider if you may take showers. You may only be allowed to take sponge baths. If directed, put ice on the affected area. To do this: Put ice in a plastic bag. Place a towel between your skin and the bag. Leave the ice on for 20 minutes, 2-3 times a day. If your skin turns bright red, remove the ice right away to prevent skin damage. The risk of skin damage is higher if you cannot feel pain, heat, or cold. Contact a health care provider if: You have signs of infection. Your pain is not controlled with medicine. You have cancer, and a temperature of 100.4F (38C) or higher. Get help right away if: You have a temperature of 101F (38.3C) or higher, or as told by your health care provider. You have bleeding from the incision site that cannot be controlled. This information is not intended to replace advice given to you by your health care provider. Make sure you discuss any questions you have with your health care provider. Document Revised: 02/21/2022 Document Reviewed: 02/21/2022 Elsevier Patient Education  2023 Elsevier Inc.                            Moderate Conscious Sedation, Adult, Care After  This sheet gives you information about how to care   for yourself after your procedure. Your health care provider may also give you more specific instructions. If you have problems or questions, contact your health care provider. What can I expect after the procedure? After the procedure, it is common to have: Sleepiness for several hours. Impaired judgment for several hours. Difficulty with balance. Vomiting if you eat too soon. Follow these instructions at home: For  the time period you were told by your health care provider:     Rest. Do not participate in activities where you could fall or become injured. Do not drive or use machinery. Do not drink alcohol. Do not take sleeping pills or medicines that cause drowsiness. Do not make important decisions or sign legal documents. Do not take care of children on your own. Eating and drinking  Follow the diet recommended by your health care provider. Drink enough fluid to keep your urine pale yellow. If you vomit: Drink water, juice, or soup when you can drink without vomiting. Make sure you have little or no nausea before eating solid foods. General instructions Take over-the-counter and prescription medicines only as told by your health care provider. Have a responsible adult stay with you for the time you are told. It is important to have someone help care for you until you are awake and alert. Do not smoke. Keep all follow-up visits as told by your health care provider. This is important. Contact a health care provider if: You are still sleepy or having trouble with balance after 24 hours. You feel light-headed. You keep feeling nauseous or you keep vomiting. You develop a rash. You have a fever. You have redness or swelling around the IV site. Get help right away if: You have trouble breathing. You have new-onset confusion at home. Summary After the procedure, it is common to feel sleepy, have impaired judgment, or feel nauseous if you eat too soon. Rest after you get home. Know the things you should not do after the procedure. Follow the diet recommended by your health care provider and drink enough fluid to keep your urine pale yellow. Get help right away if you have trouble breathing or new-onset confusion at home. This information is not intended to replace advice given to you by your health care provider. Make sure you discuss any questions you have with your health care  provider. Document Revised: 02/14/2020 Document Reviewed: 09/12/2019 Elsevier Patient Education  2023 Elsevier Inc.  

## 2023-06-05 NOTE — Procedures (Signed)
  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 Postprocedure diagnosis: same EBL:    minimal Complications:   none immediate  See full dictation in [employer].  Thora Lance MD Main # [phone] Pager  [phone] Mobile [phone]

## 2023-06-06 ENCOUNTER — Ambulatory Visit
Admission: RE | Admit: 2023-06-06 | Discharge: 2023-06-06 | Disposition: A | Discharge status: Home / Self Care | Payer: Medicare Other | Source: Ambulatory Visit | Attending: Radiation Oncology | Admitting: Radiation Oncology

## 2023-06-06 ENCOUNTER — Other Ambulatory Visit: Payer: Self-pay

## 2023-06-06 DIAGNOSIS — Z510 Encounter for antineoplastic radiation therapy: Secondary | ICD-10-CM | POA: Diagnosis not present

## 2023-06-06 DIAGNOSIS — C9000 Multiple myeloma not having achieved remission: Secondary | ICD-10-CM

## 2023-06-06 LAB — RAD ONC ARIA SESSION SUMMARY
Course Elapsed Days: 0
Plan Fractions Treated to Date: 1
Plan Prescribed Dose Per Fraction: 3 Gy
Plan Total Fractions Prescribed: 8
Plan Total Prescribed Dose: 24 Gy
Reference Point Dosage Given to Date: 3 Gy
Reference Point Session Dosage Given: 3 Gy
Session Number: 1

## 2023-06-07 ENCOUNTER — Other Ambulatory Visit: Payer: Self-pay

## 2023-06-07 ENCOUNTER — Ambulatory Visit
Admission: RE | Admit: 2023-06-07 | Discharge: 2023-06-07 | Disposition: A | Discharge status: Home / Self Care | Payer: Medicare Other | Source: Ambulatory Visit | Attending: Radiation Oncology | Admitting: Radiation Oncology

## 2023-06-07 DIAGNOSIS — Z510 Encounter for antineoplastic radiation therapy: Secondary | ICD-10-CM | POA: Diagnosis not present

## 2023-06-07 LAB — RAD ONC ARIA SESSION SUMMARY
Course Elapsed Days: 1
Plan Fractions Treated to Date: 2
Plan Prescribed Dose Per Fraction: 3 Gy
Plan Total Fractions Prescribed: 8
Plan Total Prescribed Dose: 24 Gy
Reference Point Dosage Given to Date: 6 Gy
Reference Point Session Dosage Given: 3 Gy
Session Number: 2

## 2023-06-08 ENCOUNTER — Ambulatory Visit: Admission: RE | Admit: 2023-06-08 | Payer: Medicare Other | Source: Ambulatory Visit

## 2023-06-08 ENCOUNTER — Telehealth: Payer: Self-pay | Admitting: *Deleted

## 2023-06-08 NOTE — Telephone Encounter (Signed)
Message received from patient's wife requesting call back regarding bone marrow biopsy results.  Dr. Myna Hidalgo notified.  Call placed back to patient's wife and notified her per order of Dr. Myna Hidalgo that the "bone marrow biopsy was ok" and that there "will be no change in treatment plan at this time."  Bruce Smith is appreciative of call back and has no questions or concerns at this time.

## 2023-06-09 ENCOUNTER — Ambulatory Visit: Admission: RE | Admit: 2023-06-09 | Payer: Medicare Other | Source: Ambulatory Visit

## 2023-06-09 ENCOUNTER — Other Ambulatory Visit: Payer: Self-pay

## 2023-06-09 DIAGNOSIS — Z510 Encounter for antineoplastic radiation therapy: Secondary | ICD-10-CM | POA: Diagnosis not present

## 2023-06-09 LAB — RAD ONC ARIA SESSION SUMMARY
Course Elapsed Days: 3
Plan Fractions Treated to Date: 3
Plan Prescribed Dose Per Fraction: 3 Gy
Plan Total Fractions Prescribed: 8
Plan Total Prescribed Dose: 24 Gy
Reference Point Dosage Given to Date: 9 Gy
Reference Point Session Dosage Given: 3 Gy
Session Number: 3

## 2023-06-09 LAB — SURGICAL PATHOLOGY

## 2023-06-12 ENCOUNTER — Ambulatory Visit
Admission: RE | Admit: 2023-06-12 | Discharge: 2023-06-12 | Disposition: A | Discharge status: Home / Self Care | Payer: Medicare Other | Source: Ambulatory Visit | Attending: Radiation Oncology | Admitting: Radiation Oncology

## 2023-06-12 ENCOUNTER — Encounter (HOSPITAL_COMMUNITY): Payer: Self-pay | Admitting: Hematology & Oncology

## 2023-06-12 ENCOUNTER — Other Ambulatory Visit: Payer: Self-pay

## 2023-06-12 DIAGNOSIS — Z510 Encounter for antineoplastic radiation therapy: Secondary | ICD-10-CM | POA: Diagnosis not present

## 2023-06-12 LAB — RAD ONC ARIA SESSION SUMMARY
Course Elapsed Days: 6
Plan Fractions Treated to Date: 4
Plan Prescribed Dose Per Fraction: 3 Gy
Plan Total Fractions Prescribed: 8
Plan Total Prescribed Dose: 24 Gy
Reference Point Dosage Given to Date: 12 Gy
Reference Point Session Dosage Given: 3 Gy
Session Number: 4

## 2023-06-13 ENCOUNTER — Other Ambulatory Visit: Payer: Self-pay

## 2023-06-13 ENCOUNTER — Ambulatory Visit: Admission: RE | Admit: 2023-06-13 | Payer: Medicare Other | Source: Ambulatory Visit

## 2023-06-13 ENCOUNTER — Ambulatory Visit: Payer: Medicare Other

## 2023-06-13 DIAGNOSIS — Z510 Encounter for antineoplastic radiation therapy: Secondary | ICD-10-CM | POA: Diagnosis not present

## 2023-06-13 LAB — RAD ONC ARIA SESSION SUMMARY
Course Elapsed Days: 7
Plan Fractions Treated to Date: 5
Plan Prescribed Dose Per Fraction: 3 Gy
Plan Total Fractions Prescribed: 8
Plan Total Prescribed Dose: 24 Gy
Reference Point Dosage Given to Date: 15 Gy
Reference Point Session Dosage Given: 3 Gy
Session Number: 5

## 2023-06-14 ENCOUNTER — Ambulatory Visit
Admission: RE | Admit: 2023-06-14 | Discharge: 2023-06-14 | Disposition: A | Discharge status: Home / Self Care | Payer: Medicare Other | Source: Ambulatory Visit | Attending: Radiation Oncology | Admitting: Radiation Oncology

## 2023-06-14 ENCOUNTER — Inpatient Hospital Stay: Payer: Medicare Other | Admitting: Hematology & Oncology

## 2023-06-14 ENCOUNTER — Encounter: Payer: Self-pay | Admitting: Hematology & Oncology

## 2023-06-14 ENCOUNTER — Other Ambulatory Visit: Payer: Self-pay

## 2023-06-14 ENCOUNTER — Inpatient Hospital Stay: Payer: Medicare Other

## 2023-06-14 ENCOUNTER — Encounter (HOSPITAL_COMMUNITY): Payer: Self-pay | Admitting: Hematology & Oncology

## 2023-06-14 VITALS — BP 141/64 | HR 60 | Temp 97.5°F | Resp 18 | Ht 68.0 in | Wt 204.1 lb

## 2023-06-14 DIAGNOSIS — Z923 Personal history of irradiation: Secondary | ICD-10-CM | POA: Insufficient documentation

## 2023-06-14 DIAGNOSIS — Z5112 Encounter for antineoplastic immunotherapy: Secondary | ICD-10-CM | POA: Insufficient documentation

## 2023-06-14 DIAGNOSIS — C9000 Multiple myeloma not having achieved remission: Secondary | ICD-10-CM

## 2023-06-14 DIAGNOSIS — Z9484 Stem cells transplant status: Secondary | ICD-10-CM | POA: Insufficient documentation

## 2023-06-14 DIAGNOSIS — Z510 Encounter for antineoplastic radiation therapy: Secondary | ICD-10-CM | POA: Diagnosis not present

## 2023-06-14 LAB — RAD ONC ARIA SESSION SUMMARY
Course Elapsed Days: 8
Plan Fractions Treated to Date: 6
Plan Prescribed Dose Per Fraction: 3 Gy
Plan Total Fractions Prescribed: 8
Plan Total Prescribed Dose: 24 Gy
Reference Point Dosage Given to Date: 18 Gy
Reference Point Session Dosage Given: 3 Gy
Session Number: 6

## 2023-06-14 LAB — CMP (CANCER CENTER ONLY)
ALT: 14 U/L (ref 0–44)
AST: 15 U/L (ref 15–41)
Albumin: 4.2 g/dL (ref 3.5–5.0)
Alkaline Phosphatase: 79 U/L (ref 38–126)
Anion gap: 10 (ref 5–15)
BUN: 18 mg/dL (ref 8–23)
CO2: 24 mmol/L (ref 22–32)
Calcium: 9.2 mg/dL (ref 8.9–10.3)
Chloride: 107 mmol/L (ref 98–111)
Creatinine: 1.16 mg/dL (ref 0.61–1.24)
GFR, Estimated: >60 mL/min (ref >60)
Glucose, Bld: 126 mg/dL — ABNORMAL HIGH (ref 70–99)
Potassium: 4 mmol/L (ref 3.5–5.1)
Sodium: 141 mmol/L (ref 135–145)
Total Bilirubin: 0.3 mg/dL (ref 0.3–1.2)
Total Protein: 5.9 g/dL — ABNORMAL LOW (ref 6.5–8.1)

## 2023-06-14 LAB — CBC WITH DIFFERENTIAL (CANCER CENTER ONLY)
Abs Immature Granulocytes: 0.03 10*3/uL (ref 0.00–0.07)
Basophils Absolute: 0 10*3/uL (ref 0.0–0.1)
Basophils Relative: 0 %
Eosinophils Absolute: 0.1 10*3/uL (ref 0.0–0.5)
Eosinophils Relative: 2 %
HCT: 36.8 % — ABNORMAL LOW (ref 39.0–52.0)
Hemoglobin: 11.7 g/dL — ABNORMAL LOW (ref 13.0–17.0)
Immature Granulocytes: 1 %
Lymphocytes Relative: 8 %
Lymphs Abs: 0.3 10*3/uL — ABNORMAL LOW (ref 0.7–4.0)
MCH: 29.8 pg (ref 26.0–34.0)
MCHC: 31.8 g/dL (ref 30.0–36.0)
MCV: 93.6 fL (ref 80.0–100.0)
Monocytes Absolute: 0.4 10*3/uL (ref 0.1–1.0)
Monocytes Relative: 10 %
Neutro Abs: 2.9 10*3/uL (ref 1.7–7.7)
Neutrophils Relative %: 79 %
Platelet Count: 124 10*3/uL — ABNORMAL LOW (ref 150–400)
RBC: 3.93 MIL/uL — ABNORMAL LOW (ref 4.22–5.81)
RDW: 14.3 % (ref 11.5–15.5)
WBC Count: 3.7 10*3/uL — ABNORMAL LOW (ref 4.0–10.5)
nRBC: 0 % (ref 0.0–0.2)

## 2023-06-14 LAB — LACTATE DEHYDROGENASE: LDH: 133 U/L (ref 98–192)

## 2023-06-14 MED ORDER — ACETAMINOPHEN 325 MG PO TABS: 650.0000 mg | ORAL_TABLET | Freq: Once | ORAL | Status: DC

## 2023-06-14 MED ORDER — FAMOTIDINE 20 MG PO TABS: 40.0000 mg | ORAL_TABLET | Freq: Once | ORAL | Status: DC

## 2023-06-14 MED ORDER — DIPHENHYDRAMINE HCL 25 MG PO CAPS: 50.0000 mg | ORAL_CAPSULE | Freq: Once | ORAL | Status: DC

## 2023-06-14 MED ORDER — DARATUMUMAB-HYALURONIDASE-FIHJ 1800-30000 MG-UT/15ML ~~LOC~~ SOLN
1800.0000 mg | Freq: Once | SUBCUTANEOUS | Status: AC
  Administered 2023-06-14: 1800 mg via SUBCUTANEOUS
  Filled 2023-06-14: qty 15

## 2023-06-14 NOTE — Patient Instructions (Signed)
[facility]  Discharge Instructions: Thank you for choosing [facility] to provide your oncology and hematology care.   If you have a lab appointment with the Cancer Center, please go directly to the Cancer Center and check in at the registration area.  Wear comfortable clothing and clothing appropriate for easy access to any Portacath or PICC line.   We strive to give you quality time with your provider. You may need to reschedule your appointment if you arrive late (15 or more minutes).  Arriving late affects you and other patients whose appointments are after yours.  Also, if you miss three or more appointments without notifying the office, you may be dismissed from the clinic at the provider's discretion.      For prescription refill requests, have your pharmacy contact our office and allow 72 hours for refills to be completed.    Today you received the following chemotherapy and/or immunotherapy agents darzalex      To help prevent nausea and vomiting after your treatment, we encourage you to take your nausea medication as directed.  BELOW ARE SYMPTOMS THAT SHOULD BE REPORTED IMMEDIATELY: *FEVER GREATER THAN 100.4 F (38 C) OR HIGHER *CHILLS OR SWEATING *NAUSEA AND VOMITING THAT IS NOT CONTROLLED WITH YOUR NAUSEA MEDICATION *UNUSUAL SHORTNESS OF BREATH *UNUSUAL BRUISING OR BLEEDING *URINARY PROBLEMS (pain or burning when urinating, or frequent urination) *BOWEL PROBLEMS (unusual diarrhea, constipation, pain near the anus) TENDERNESS IN MOUTH AND THROAT WITH OR WITHOUT PRESENCE OF ULCERS (sore throat, sores in mouth, or a toothache) UNUSUAL RASH, SWELLING OR PAIN  UNUSUAL VAGINAL DISCHARGE OR ITCHING   Items with * indicate a potential emergency and should be followed up as soon as possible or go to the Emergency Department if any problems should occur.  Please show the CHEMOTHERAPY ALERT CARD or IMMUNOTHERAPY ALERT CARD at check-in  to the Emergency Department and triage nurse. Should you have questions after your visit or need to cancel or reschedule your appointment, please contact [facility]  [phone] and follow the prompts.  Office hours are 8:00 a.m. to 4:30 p.m. Monday - Friday. Please note that voicemails left after 4:00 p.m. may not be returned until the following business day.  We are closed weekends and major holidays. You have access to a nurse at all times for urgent questions. Please call the main number to the clinic [phone] and follow the prompts.  For any non-urgent questions, you may also contact your provider using MyChart. We now offer e-Visits for anyone 22 and older to request care online for non-urgent symptoms. For details visit [email or web address].   Also download the MyChart app! Go to the app store, search "MyChart", open the app, select , and log in with your MyChart username and password.

## 2023-06-14 NOTE — Progress Notes (Signed)
Hematology and Oncology Follow Up Visit  Bjarne Freda [id number] Jan 21, 1951 72 y.o. 06/14/2023   Principle Diagnosis:  Kappa light chain myeloma-extensive bone disease-pathologic fracture of left scapula and left femur - t(11:14), 13q-, 1p-,1q-  Past Therapy: Status post surgical repair of left femur-07/14/2020 XRT to left shoulder and left hip Xgeva 120 mg subcu every 3 months-next dose in December 2022  Faspro/Velcade/Revlimid/Decadron -- s/p cycle #2 -- start on       08/06/2020  ( Revlimid is 14 on /14 off)    Current Therapy:        ASCT -- [facility] on -01/22/2021 Faspro - q month -- maintenance -- start on 05/13/2021 Zometa 4 mg IV every 3 months-next dose in 08/2023  Radiation therapy to T12, left iliac and left humerus --completed on 10/04/2022 --XRT to right femoral head -start 01/03/2023 XRT to T9 lesion -to be completed on 06/18/2023    Interim History:  Mr. Cottrill is here today with his wife for follow-up.  Unfortunately, he did have a a positive PET scan.  This is at T9.  He now started radiotherapy.  I think he has 3 more treatments left.  We did do a bone marrow biopsy on him.  This was done on 06/05/2023.  The pathology report [id number]) showed a normocellular bone marrow.  There is only 2% plasma cells by immunohistochemical staining.  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  I realize that myeloma can be "patchy" in the bone marrow.  However I would think that with the marrow that he has had done, we would have found it.  Because the bone marrow was normal, we can just maintain our current status.  He is on daratumumab.  Marina Goodell feels well.  Maybe a little bit tired from the radiation therapy.  He has had no back pain.  He said no change in bowel or bladder habits.  Maybe a little bit of diarrhea.  He has had no fever.  He has had a good appetite.  He has had no cardiac issues.  He does see his  cardiothoracic surgeon.  There has little bit of swelling in the legs.  He can get back onto his diuretic if he would like to.  Overall, I would say that his performance status right now is probably ECOG 1.     . Medications:  Allergies as of 06/14/2023   No Known Allergies      Medication List        Accurate as of June 14, 2023  2:05 PM. If you have any questions, ask your nurse or doctor.          STOP taking these medications    Magnesium Oxide 140 MG Caps Stopped by: Josph Macho       TAKE these medications    ACETAMINOPHEN ER PO Take 1,000 mg by mouth in the morning, at noon, in the evening, and at bedtime.   aspirin EC 81 MG tablet Take 81 mg by mouth at bedtime.   calcium carbonate 1250 (500 Ca) MG tablet Commonly known as: OS-CAL - dosed in mg of elemental calcium Take 1 tablet (500 mg of elemental calcium total) by mouth 2 (two) times daily with a meal.   furosemide 20 MG tablet Commonly known as: LASIX Take 1 tablet by mouth daily for 3 days, then take daily as needed thereafter for lower extremity edema   levETIRAcetam 500 MG  tablet Commonly known as: KEPPRA TAKE 1 TABLET BY MOUTH 2 TIMES A DAY   loperamide 2 MG capsule Commonly known as: IMODIUM Take 1 capsule (2 mg total) by mouth as needed for diarrhea or loose stools.   MAGNESIUM PO Take 100 mg by mouth daily.   nitroGLYCERIN 0.4 MG SL tablet Commonly known as: NITROSTAT Place 0.4 mg under the tongue every 5 (five) minutes as needed.   ondansetron 8 MG tablet Commonly known as: ZOFRAN TAKE 1 TABLET BY MOUTH EVERY 8 HOURS AS NEEDED NAUSEA AND VOMITING What changed:  how much to take how to take this when to take this reasons to take this additional instructions   pantoprazole 40 MG tablet Commonly known as: PROTONIX TAKE 1 TABLET BY MOUTH 2 TIMES A DAY BEFORE MEALS FOR ACID REFLUX   polyethylene glycol powder 17 GM/SCOOP powder Commonly known as: GLYCOLAX/MIRALAX Mix 17g  (1 capful) with 8 oz of favorite beverage and drink every morning   pregabalin 200 MG capsule Commonly known as: LYRICA TAKE 1 CAPSULE BY MOUTH 2 TIMES DAILY   prochlorperazine 10 MG tablet Commonly known as: COMPAZINE Take 1 tablet (10 mg total) by mouth every 6 (six) hours as needed for nausea or vomiting.   rosuvastatin 40 MG tablet Commonly known as: CRESTOR Take 40 mg by mouth daily.   saccharomyces boulardii 250 MG capsule Commonly known as: FLORASTOR Take 250 mg by mouth daily.   tamsulosin 0.4 MG Caps capsule Commonly known as: FLOMAX TAKE 1 CAPSULE BY MOUTH EVERY NIGHT AT BEDTIME   traMADol 50 MG tablet Commonly known as: ULTRAM TAKE TWO TABLETS BY MOUTH EVERY 6 HOURS AS NEEDED FOR PAIN        Allergies: No Known Allergies  Past Medical History, Surgical history, Social history, and Family History were reviewed and updated.  Review of Systems: Review of Systems  Constitutional: Negative.   HENT: Negative.    Eyes: Negative.   Respiratory: Negative.    Cardiovascular: Negative.   Gastrointestinal: Negative.   Genitourinary: Negative.   Musculoskeletal:  Positive for joint pain.  Skin: Negative.   Neurological: Negative.   Endo/Heme/Allergies: Negative.   Psychiatric/Behavioral: Negative.       Physical Exam:  height is 5\' 8"  (1.727 m) and weight is 204 lb 1.3 oz (92.6 kg). His oral temperature is 97.5 F (36.4 C) (abnormal). His blood pressure is 141/64 (abnormal) and his pulse is 60. His respiration is 18 and oxygen saturation is 100%.   Wt Readings from Last 3 Encounters:  06/14/23 204 lb 1.3 oz (92.6 kg)  06/05/23 203 lb 8 oz (92.3 kg)  05/29/23 203 lb 8 oz (92.3 kg)    Physical Exam Vitals reviewed.  Constitutional:      Comments: This is a well-developed and well-nourished white male in no obvious distress.  He has a nicely healed median sternotomy scar.  HENT:     Head: Normocephalic and atraumatic.  Eyes:     Pupils: Pupils are equal,  round, and reactive to light.  Cardiovascular:     Rate and Rhythm: Normal rate and regular rhythm.     Heart sounds: Normal heart sounds.  Pulmonary:     Effort: Pulmonary effort is normal.     Breath sounds: Normal breath sounds.  Abdominal:     General: Bowel sounds are normal.     Palpations: Abdomen is soft.  Musculoskeletal:        General: No tenderness or deformity. Normal range of motion.  Cervical back: Normal range of motion.     Comments: There is pain to palpation in the left hip area.  This is in the lateral hip area.  He has some decreased range of motion.  Lymphadenopathy:     Cervical: No cervical adenopathy.  Skin:    General: Skin is warm and dry.     Findings: No erythema or rash.  Neurological:     Mental Status: He is alert and oriented to person, place, and time.  Psychiatric:        Behavior: Behavior normal.        Thought Content: Thought content normal.        Judgment: Judgment normal.     Lab Results  Component Value Date   WBC 3.7 (L) 06/14/2023   HGB 11.7 (L) 06/14/2023   HCT 36.8 (L) 06/14/2023   MCV 93.6 06/14/2023   PLT 124 (L) 06/14/2023   Lab Results  Component Value Date   FERRITIN 938 (H) 02/23/2021   IRON 67 02/23/2021   TIBC 156 (L) 02/23/2021   UIBC 90 (L) 02/23/2021   IRONPCTSAT 43 02/23/2021   Lab Results  Component Value Date   RBC 3.93 (L) 06/14/2023   Lab Results  Component Value Date   KPAFRELGTCHN 8.8 04/19/2023   LAMBDASER 7.3 04/19/2023   KAPLAMBRATIO 5.61 04/19/2023   Lab Results  Component Value Date   IGGSERUM 315 (L) 04/19/2023   IGGSERUM 312 (L) 04/19/2023   IGA 15 (L) 04/19/2023   IGA 16 (L) 04/19/2023   IGMSERUM 9 (L) 04/19/2023   IGMSERUM 8 (L) 04/19/2023   Lab Results  Component Value Date   TOTALPROTELP 5.3 (L) 04/19/2023   ALBUMINELP 3.2 04/19/2023   A1GS 0.3 04/19/2023   A2GS 0.8 04/19/2023   BETS 0.7 04/19/2023   GAMS 0.3 (L) 04/19/2023   MSPIKE 0.1 (H) 04/19/2023     Chemistry       Component Value Date/Time   NA 141 06/14/2023 1304   K 4.0 06/14/2023 1304   CL 107 06/14/2023 1304   CO2 24 06/14/2023 1304   BUN 18 06/14/2023 1304   CREATININE 1.16 06/14/2023 1304      Component Value Date/Time   CALCIUM 9.2 06/14/2023 1304   ALKPHOS 79 06/14/2023 1304   AST 15 06/14/2023 1304   ALT 14 06/14/2023 1304   BILITOT 0.3 06/14/2023 1304       Impression and Plan: Mr. Hanek is a very pleasant 72 yo gentleman with 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   Again, he has these recurrences but they are just in the bones.  We will continue him on the Faspro.  He does not need any Zometa until the October.  I probably would not repeat a PET scan probably until November.  If we ever did find that he has marrow involvement, then I think he might be a candidate for CAR-T therapy.  Neldon Labella is happy that his quality life is doing well right now.  He was responds well to radiation.  Whenever you follow-up his radiation with a PET scan, the activity is gone.  We will plan to get him back in 4 weeks.    Josph Macho, MD 8/14/20242:05 PM

## 2023-06-15 ENCOUNTER — Ambulatory Visit: Payer: Medicare Other

## 2023-06-15 LAB — IGG, IGA, IGM
IgA: 14 mg/dL — ABNORMAL LOW (ref 61–437)
IgG (Immunoglobin G), Serum: 325 mg/dL — ABNORMAL LOW (ref 603–1613)
IgM (Immunoglobulin M), Srm: 6 mg/dL — ABNORMAL LOW (ref 15–143)

## 2023-06-15 LAB — KAPPA/LAMBDA LIGHT CHAINS
Kappa free light chain: 7.9 mg/L (ref 3.3–19.4)
Kappa, lambda light chain ratio: 1.88 — ABNORMAL HIGH (ref 0.26–1.65)
Lambda free light chains: 4.2 mg/L — ABNORMAL LOW (ref 5.7–26.3)

## 2023-06-16 ENCOUNTER — Ambulatory Visit: Payer: Medicare Other

## 2023-06-16 ENCOUNTER — Ambulatory Visit
Admission: RE | Admit: 2023-06-16 | Discharge: 2023-06-16 | Disposition: A | Discharge status: Home / Self Care | Payer: Medicare Other | Source: Ambulatory Visit | Attending: Radiation Oncology | Admitting: Radiation Oncology

## 2023-06-16 ENCOUNTER — Other Ambulatory Visit: Payer: Self-pay

## 2023-06-16 DIAGNOSIS — Z510 Encounter for antineoplastic radiation therapy: Secondary | ICD-10-CM | POA: Diagnosis not present

## 2023-06-16 LAB — RAD ONC ARIA SESSION SUMMARY
Course Elapsed Days: 10
Plan Fractions Treated to Date: 7
Plan Prescribed Dose Per Fraction: 3 Gy
Plan Total Fractions Prescribed: 8
Plan Total Prescribed Dose: 24 Gy
Reference Point Dosage Given to Date: 21 Gy
Reference Point Session Dosage Given: 3 Gy
Session Number: 7

## 2023-06-16 LAB — PROTEIN ELECTROPHORESIS, SERUM, WITH REFLEX
A/G Ratio: 1.8 — ABNORMAL HIGH (ref 0.7–1.7)
Albumin ELP: 3.5 g/dL (ref 2.9–4.4)
Alpha-1-Globulin: 0.3 g/dL (ref 0.0–0.4)
Alpha-2-Globulin: 0.8 g/dL (ref 0.4–1.0)
Beta Globulin: 0.7 g/dL (ref 0.7–1.3)
Gamma Globulin: 0.3 g/dL — ABNORMAL LOW (ref 0.4–1.8)
Globulin, Total: 2 g/dL — ABNORMAL LOW (ref 2.2–3.9)
Total Protein ELP: 5.5 g/dL — ABNORMAL LOW (ref 6.0–8.5)

## 2023-06-19 ENCOUNTER — Ambulatory Visit
Admission: RE | Admit: 2023-06-19 | Discharge: 2023-06-19 | Disposition: A | Discharge status: Home / Self Care | Payer: Medicare Other | Source: Ambulatory Visit | Attending: Radiation Oncology | Admitting: Radiation Oncology

## 2023-06-19 ENCOUNTER — Other Ambulatory Visit: Payer: Self-pay

## 2023-06-19 ENCOUNTER — Other Ambulatory Visit: Payer: Self-pay | Admitting: Radiation Oncology

## 2023-06-19 DIAGNOSIS — Z510 Encounter for antineoplastic radiation therapy: Secondary | ICD-10-CM | POA: Diagnosis not present

## 2023-06-19 LAB — RAD ONC ARIA SESSION SUMMARY
Course Elapsed Days: 13
Plan Fractions Treated to Date: 8
Plan Prescribed Dose Per Fraction: 3 Gy
Plan Total Fractions Prescribed: 8
Plan Total Prescribed Dose: 24 Gy
Reference Point Dosage Given to Date: 24 Gy
Reference Point Session Dosage Given: 3 Gy
Session Number: 8

## 2023-06-19 MED ORDER — SUCRALFATE 1 G PO TABS: 1.0000 g | ORAL_TABLET | Freq: Three times a day (TID) | ORAL | 1 refills | Status: DC

## 2023-06-20 NOTE — Radiation Completion Notes (Signed)
Patient Name: Bruce Smith, Bruce Smith MRN: [id number] Date of Birth: 1951/08/14 Referring Physician: Daneil Dolin, M.D. Date of Service: 2023-06-20 Radiation Oncologist: Arnette Schaumann, M.D. [facility]                             RADIATION ONCOLOGY END OF TREATMENT NOTE     Diagnosis: C90.00 Multiple myeloma not having achieved remission Intent: Palliative     ==========DELIVERED PLANS==========  First Treatment Date: 2023-06-06 - Last Treatment Date: 2023-06-19   Plan Name: Spine_T Site: Thoracic Spine Technique: 3D Mode: Photon Dose Per Fraction: 3 Gy Prescribed Dose (Delivered / Prescribed): 24 Gy / 24 Gy Prescribed Fxs (Delivered / Prescribed): 8 / 8     ==========ON TREATMENT VISIT DATES========== 2023-06-06, 2023-06-19     ==========UPCOMING VISITS==========       ==========APPENDIX - ON TREATMENT VISIT NOTES==========   See weekly On Treatment Notes in Epic for details.

## 2023-06-30 ENCOUNTER — Other Ambulatory Visit: Payer: Self-pay | Admitting: Hematology & Oncology

## 2023-07-07 ENCOUNTER — Other Ambulatory Visit: Payer: Self-pay | Admitting: Hematology & Oncology

## 2023-07-07 DIAGNOSIS — C9000 Multiple myeloma not having achieved remission: Secondary | ICD-10-CM

## 2023-07-12 ENCOUNTER — Encounter: Payer: Self-pay | Admitting: Hematology & Oncology

## 2023-07-12 ENCOUNTER — Inpatient Hospital Stay (HOSPITAL_BASED_OUTPATIENT_CLINIC_OR_DEPARTMENT_OTHER): Payer: Medicare Other | Admitting: Hematology & Oncology

## 2023-07-12 ENCOUNTER — Inpatient Hospital Stay: Payer: Medicare Other

## 2023-07-12 ENCOUNTER — Inpatient Hospital Stay: Payer: Medicare Other | Attending: Hematology & Oncology

## 2023-07-12 VITALS — BP 135/48 | HR 63 | Temp 98.1°F | Resp 20 | Ht 68.0 in | Wt 203.1 lb

## 2023-07-12 DIAGNOSIS — C9000 Multiple myeloma not having achieved remission: Secondary | ICD-10-CM | POA: Insufficient documentation

## 2023-07-12 DIAGNOSIS — Z9484 Stem cells transplant status: Secondary | ICD-10-CM | POA: Diagnosis not present

## 2023-07-12 DIAGNOSIS — Z5112 Encounter for antineoplastic immunotherapy: Secondary | ICD-10-CM | POA: Insufficient documentation

## 2023-07-12 DIAGNOSIS — Z923 Personal history of irradiation: Secondary | ICD-10-CM | POA: Diagnosis not present

## 2023-07-12 LAB — CMP (CANCER CENTER ONLY)
ALT: 20 U/L (ref 0–44)
AST: 17 U/L (ref 15–41)
Albumin: 4.1 g/dL (ref 3.5–5.0)
Alkaline Phosphatase: 86 U/L (ref 38–126)
Anion gap: 6 (ref 5–15)
BUN: 20 mg/dL (ref 8–23)
CO2: 30 mmol/L (ref 22–32)
Calcium: 9.3 mg/dL (ref 8.9–10.3)
Chloride: 105 mmol/L (ref 98–111)
Creatinine: 1.21 mg/dL (ref 0.61–1.24)
GFR, Estimated: >60 mL/min (ref >60)
Glucose, Bld: 116 mg/dL — ABNORMAL HIGH (ref 70–99)
Potassium: 4.1 mmol/L (ref 3.5–5.1)
Sodium: 141 mmol/L (ref 135–145)
Total Bilirubin: 0.3 mg/dL (ref 0.3–1.2)
Total Protein: 5.7 g/dL — ABNORMAL LOW (ref 6.5–8.1)

## 2023-07-12 LAB — CBC WITH DIFFERENTIAL (CANCER CENTER ONLY)
Abs Immature Granulocytes: 0.02 10*3/uL (ref 0.00–0.07)
Basophils Absolute: 0 10*3/uL (ref 0.0–0.1)
Basophils Relative: 0 %
Eosinophils Absolute: 0.1 10*3/uL (ref 0.0–0.5)
Eosinophils Relative: 2 %
HCT: 38.3 % — ABNORMAL LOW (ref 39.0–52.0)
Hemoglobin: 12.1 g/dL — ABNORMAL LOW (ref 13.0–17.0)
Immature Granulocytes: 0 %
Lymphocytes Relative: 7 %
Lymphs Abs: 0.3 10*3/uL — ABNORMAL LOW (ref 0.7–4.0)
MCH: 29.6 pg (ref 26.0–34.0)
MCHC: 31.6 g/dL (ref 30.0–36.0)
MCV: 93.6 fL (ref 80.0–100.0)
Monocytes Absolute: 0.7 10*3/uL (ref 0.1–1.0)
Monocytes Relative: 15 %
Neutro Abs: 3.8 10*3/uL (ref 1.7–7.7)
Neutrophils Relative %: 76 %
Platelet Count: 80 10*3/uL — ABNORMAL LOW (ref 150–400)
RBC: 4.09 MIL/uL — ABNORMAL LOW (ref 4.22–5.81)
RDW: 14.9 % (ref 11.5–15.5)
WBC Count: 5 10*3/uL (ref 4.0–10.5)
nRBC: 0 % (ref 0.0–0.2)

## 2023-07-12 LAB — LACTATE DEHYDROGENASE: LDH: 120 U/L (ref 98–192)

## 2023-07-12 MED ORDER — ACETAMINOPHEN 325 MG PO TABS: 650.0000 mg | ORAL_TABLET | Freq: Once | ORAL | Status: DC

## 2023-07-12 MED ORDER — DIPHENHYDRAMINE HCL 25 MG PO CAPS: 50.0000 mg | ORAL_CAPSULE | Freq: Once | ORAL | Status: DC

## 2023-07-12 MED ORDER — DARATUMUMAB-HYALURONIDASE-FIHJ 1800-30000 MG-UT/15ML ~~LOC~~ SOLN
1800.0000 mg | Freq: Once | SUBCUTANEOUS | Status: AC
  Administered 2023-07-12: 1800 mg via SUBCUTANEOUS
  Filled 2023-07-12: qty 15

## 2023-07-12 MED ORDER — FAMOTIDINE 20 MG PO TABS: 40.0000 mg | ORAL_TABLET | Freq: Once | ORAL | Status: DC

## 2023-07-12 NOTE — Patient Instructions (Signed)
[facility]  Discharge Instructions: Thank you for choosing [facility] to provide your oncology and hematology care.   If you have a lab appointment with the Cancer Center, please go directly to the Cancer Center and check in at the registration area.  Wear comfortable clothing and clothing appropriate for easy access to any Portacath or PICC line.   We strive to give you quality time with your provider. You may need to reschedule your appointment if you arrive late (15 or more minutes).  Arriving late affects you and other patients whose appointments are after yours.  Also, if you miss three or more appointments without notifying the office, you may be dismissed from the clinic at the provider's discretion.      For prescription refill requests, have your pharmacy contact our office and allow 72 hours for refills to be completed.    Today you received the following chemotherapy and/or immunotherapy agents faspro     To help prevent nausea and vomiting after your treatment, we encourage you to take your nausea medication as directed.  BELOW ARE SYMPTOMS THAT SHOULD BE REPORTED IMMEDIATELY: *FEVER GREATER THAN 100.4 F (38 C) OR HIGHER *CHILLS OR SWEATING *NAUSEA AND VOMITING THAT IS NOT CONTROLLED WITH YOUR NAUSEA MEDICATION *UNUSUAL SHORTNESS OF BREATH *UNUSUAL BRUISING OR BLEEDING *URINARY PROBLEMS (pain or burning when urinating, or frequent urination) *BOWEL PROBLEMS (unusual diarrhea, constipation, pain near the anus) TENDERNESS IN MOUTH AND THROAT WITH OR WITHOUT PRESENCE OF ULCERS (sore throat, sores in mouth, or a toothache) UNUSUAL RASH, SWELLING OR PAIN  UNUSUAL VAGINAL DISCHARGE OR ITCHING   Items with * indicate a potential emergency and should be followed up as soon as possible or go to the Emergency Department if any problems should occur.  Please show the CHEMOTHERAPY ALERT CARD or IMMUNOTHERAPY ALERT CARD at check-in to  the Emergency Department and triage nurse. Should you have questions after your visit or need to cancel or reschedule your appointment, please contact [facility]  [phone] and follow the prompts.  Office hours are 8:00 a.m. to 4:30 p.m. Monday - Friday. Please note that voicemails left after 4:00 p.m. may not be returned until the following business day.  We are closed weekends and major holidays. You have access to a nurse at all times for urgent questions. Please call the main number to the clinic [phone] and follow the prompts.  For any non-urgent questions, you may also contact your provider using MyChart. We now offer e-Visits for anyone 18 and older to request care online for non-urgent symptoms. For details visit [email or web address].   Also download the MyChart app! Go to the app store, search "MyChart", open the app, select [place], and log in with your MyChart username and password.   

## 2023-07-12 NOTE — Progress Notes (Signed)
Per Dr. Ennever, okay to treat today despite labs ?

## 2023-07-12 NOTE — Progress Notes (Signed)
Hematology and Oncology Follow Up Visit  Bruce Smith [id number] 1951/10/14 72 y.o. 07/12/2023   Principle Diagnosis:  Kappa light chain myeloma-extensive bone disease-pathologic fracture of left scapula and left femur - t(11:14), 13q-, 1p-,1q-  Past Therapy: Status post surgical repair of left femur-07/14/2020 XRT to left shoulder and left hip Xgeva 120 mg subcu every 3 months-next dose in December 2022  Faspro/Velcade/Revlimid/Decadron -- s/p cycle #2 -- start on       08/06/2020  ( Revlimid is 14 on /14 off)    Current Therapy:        ASCT -- [facility] on -01/22/2021 Faspro - q month -- maintenance -- start on 05/13/2021 Zometa 4 mg IV every 3 months-next dose in 08/2023  Radiation therapy to T12, left iliac and left humerus --completed on 10/04/2022 --XRT to right femoral head -start 01/03/2023 XRT to T9 lesion -to be completed on 06/18/2023    Interim History:  Bruce Smith is here today with his wife for follow-up.  The big news that he now has a new great granddaughter.  This is his third great grandchild.  He is truly blast.  The great granddaughter only weighed 5 pounds when she was born but she is healthy.  He completed radiation therapy to the T9 lesion.  We will set him up with a follow-up PET scan probably in November.  He has had no problems with nausea or vomiting.  He is no cough or shortness of breath.  He has had no bleeding.  There is been no headache.  He has had no swollen lymph nodes.  His last monoclonal spikes done back in August did not show monoclonal spike in his blood.  The Kappa light chain was 0.8 mg/dL.  He has had no fever.  He has had no mouth sores.  He has had no problems with his heart.  He did have open heart surgery back in the spring.  He saw his cardiologist.  Everything looked fine.  Overall, I would have to say that his performance status is probably ECOG 1.   . Medications:  Allergies as of 07/12/2023   No Known Allergies      Medication List         Accurate as of July 12, 2023 10:47 AM. If you have any questions, ask your nurse or doctor.          ACETAMINOPHEN ER PO Take 1,000 mg by mouth in the morning, at noon, in the evening, and at bedtime.   aspirin EC 81 MG tablet Take 81 mg by mouth at bedtime.   calcium carbonate 1250 (500 Ca) MG tablet Commonly known as: OS-CAL - dosed in mg of elemental calcium Take 1 tablet (500 mg of elemental calcium total) by mouth 2 (two) times daily with a meal.   furosemide 20 MG tablet Commonly known as: LASIX Take 1 tablet by mouth daily for 3 Smith, then take daily as needed thereafter for lower extremity edema   levETIRAcetam 500 MG tablet Commonly known as: KEPPRA TAKE 1 TABLET BY MOUTH 2 TIMES A DAY   loperamide 2 MG capsule Commonly known as: IMODIUM Take 1 capsule (2 mg total) by mouth as needed for diarrhea or loose stools.   MAGNESIUM PO Take 100 mg by mouth daily.   metoprolol tartrate 25 MG tablet Commonly known as: LOPRESSOR Take 25 mg by mouth daily.   nitroGLYCERIN 0.4 MG SL tablet Commonly known as: NITROSTAT Place 0.4 mg under the tongue every 5 (  five) minutes as needed.   ondansetron 8 MG tablet Commonly known as: ZOFRAN TAKE 1 TABLET BY MOUTH EVERY 8 HOURS AS NEEDED NAUSEA AND VOMITING What changed:  how much to take how to take this when to take this reasons to take this additional instructions   pantoprazole 40 MG tablet Commonly known as: PROTONIX TAKE 1 TABLET BY MOUTH 2 TIMES A DAY BEFORE MEALS FOR ACID REFLUX   polyethylene glycol powder 17 GM/SCOOP powder Commonly known as: GLYCOLAX/MIRALAX Mix 17g (1 capful) with 8 oz of favorite beverage and drink every morning   pregabalin 200 MG capsule Commonly known as: LYRICA TAKE 1 CAPSULE BY MOUTH 2 TIMES DAILY   prochlorperazine 10 MG tablet Commonly known as: COMPAZINE Take 1 tablet (10 mg total) by mouth every 6 (six) hours as needed for nausea or vomiting.   rosuvastatin 40 MG  tablet Commonly known as: CRESTOR Take 40 mg by mouth daily.   saccharomyces boulardii 250 MG capsule Commonly known as: FLORASTOR Take 250 mg by mouth daily.   sucralfate 1 g tablet Commonly known as: Carafate Take 1 tablet (1 g total) by mouth 4 (four) times daily -  with meals and at bedtime. Crush and dissolve in 10 mL's of warm water prior to swallowing, take 20 min prior to meals   tamsulosin 0.4 MG Caps capsule Commonly known as: FLOMAX TAKE 1 CAPSULE BY MOUTH EVERY NIGHT AT BEDTIME   traMADol 50 MG tablet Commonly known as: ULTRAM TAKE 2 TABLETS BY MOUTH EVERY 6 HOURS ASNEEDED FOR PAIN        Allergies: No Known Allergies  Past Medical History, Surgical history, Social history, and Family History were reviewed and updated.  Review of Systems: Review of Systems  Constitutional: Negative.   HENT: Negative.    Eyes: Negative.   Respiratory: Negative.    Cardiovascular: Negative.   Gastrointestinal: Negative.   Genitourinary: Negative.   Musculoskeletal:  Positive for joint pain.  Skin: Negative.   Neurological: Negative.   Endo/Heme/Allergies: Negative.   Psychiatric/Behavioral: Negative.       Physical Exam:  height is 5\' 8"  (1.727 m) and weight is 203 lb 1.9 oz (92.1 kg). His oral temperature is 98.1 F (36.7 C). His blood pressure is 135/48 (abnormal) and his pulse is 63. His respiration is 20 and oxygen saturation is 96%.   Wt Readings from Last 3 Encounters:  07/12/23 203 lb 1.9 oz (92.1 kg)  06/14/23 204 lb 1.3 oz (92.6 kg)  06/05/23 203 lb 8 oz (92.3 kg)    Physical Exam Vitals reviewed.  Constitutional:      Comments: This is a well-developed and well-nourished white male in no obvious distress.  He has a nicely healed median sternotomy scar.  HENT:     Head: Normocephalic and atraumatic.  Eyes:     Pupils: Pupils are equal, round, and reactive to light.  Cardiovascular:     Rate and Rhythm: Normal rate and regular rhythm.     Heart  sounds: Normal heart sounds.  Pulmonary:     Effort: Pulmonary effort is normal.     Breath sounds: Normal breath sounds.  Abdominal:     General: Bowel sounds are normal.     Palpations: Abdomen is soft.  Musculoskeletal:        General: No tenderness or deformity. Normal range of motion.     Cervical back: Normal range of motion.     Comments: There is pain to palpation in the left  hip area.  This is in the lateral hip area.  He has some decreased range of motion.  Lymphadenopathy:     Cervical: No cervical adenopathy.  Skin:    General: Skin is warm and dry.     Findings: No erythema or rash.  Neurological:     Mental Status: He is alert and oriented to person, place, and time.  Psychiatric:        Behavior: Behavior normal.        Thought Content: Thought content normal.        Judgment: Judgment normal.     Lab Results  Component Value Date   WBC 5.0 07/12/2023   HGB 12.1 (L) 07/12/2023   HCT 38.3 (L) 07/12/2023   MCV 93.6 07/12/2023   PLT 80 (L) 07/12/2023   Lab Results  Component Value Date   FERRITIN 938 (H) 02/23/2021   IRON 67 02/23/2021   TIBC 156 (L) 02/23/2021   UIBC 90 (L) 02/23/2021   IRONPCTSAT 43 02/23/2021   Lab Results  Component Value Date   RBC 4.09 (L) 07/12/2023   Lab Results  Component Value Date   KPAFRELGTCHN 7.9 06/14/2023   LAMBDASER 4.2 (L) 06/14/2023   KAPLAMBRATIO 1.88 (H) 06/14/2023   Lab Results  Component Value Date   IGGSERUM 325 (L) 06/14/2023   IGA 14 (L) 06/14/2023   IGMSERUM 6 (L) 06/14/2023   Lab Results  Component Value Date   TOTALPROTELP 5.5 (L) 06/14/2023   ALBUMINELP 3.5 06/14/2023   A1GS 0.3 06/14/2023   A2GS 0.8 06/14/2023   BETS 0.7 06/14/2023   GAMS 0.3 (L) 06/14/2023   MSPIKE Not Observed 06/14/2023     Chemistry      Component Value Date/Time   NA 141 07/12/2023 0944   K 4.1 07/12/2023 0944   CL 105 07/12/2023 0944   CO2 30 07/12/2023 0944   BUN 20 07/12/2023 0944   CREATININE 1.21 07/12/2023  0944      Component Value Date/Time   CALCIUM 9.3 07/12/2023 0944   ALKPHOS 86 07/12/2023 0944   AST 17 07/12/2023 0944   ALT 20 07/12/2023 0944   BILITOT 0.3 07/12/2023 0944       Impression and Plan: Mr. Shytle is a very pleasant 72 yo gentleman with 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   Again, he has these recurrences but they are just in the bones.  We will continue him on the Faspro.  He does not need any Zometa until the October.  I probably would not repeat a PET scan probably until November.  We will see him back in 1 month.    Josph Macho, MD 9/11/202410:47 AM

## 2023-07-13 LAB — IGG, IGA, IGM
IgA: 15 mg/dL — ABNORMAL LOW (ref 61–437)
IgG (Immunoglobin G), Serum: 337 mg/dL — ABNORMAL LOW (ref 603–1613)
IgM (Immunoglobulin M), Srm: 6 mg/dL — ABNORMAL LOW (ref 15–143)

## 2023-07-13 LAB — KAPPA/LAMBDA LIGHT CHAINS
Kappa free light chain: 10.4 mg/L (ref 3.3–19.4)
Kappa, lambda light chain ratio: 2.54 — ABNORMAL HIGH (ref 0.26–1.65)
Lambda free light chains: 4.1 mg/L — ABNORMAL LOW (ref 5.7–26.3)

## 2023-07-14 ENCOUNTER — Other Ambulatory Visit: Payer: Self-pay

## 2023-07-16 LAB — PROTEIN ELECTROPHORESIS, SERUM, WITH REFLEX
A/G Ratio: 1.5 (ref 0.7–1.7)
Albumin ELP: 3.3 g/dL (ref 2.9–4.4)
Alpha-1-Globulin: 0.3 g/dL (ref 0.0–0.4)
Alpha-2-Globulin: 0.8 g/dL (ref 0.4–1.0)
Beta Globulin: 0.8 g/dL (ref 0.7–1.3)
Gamma Globulin: 0.3 g/dL — ABNORMAL LOW (ref 0.4–1.8)
Globulin, Total: 2.2 g/dL (ref 2.2–3.9)
Total Protein ELP: 5.5 g/dL — ABNORMAL LOW (ref 6.0–8.5)

## 2023-07-19 ENCOUNTER — Encounter: Payer: Self-pay | Admitting: Radiation Oncology

## 2023-07-19 NOTE — Progress Notes (Signed)
Radiation Oncology         [phone] ________________________________  Name: Bruce Smith MRN: [id number]  Date: 07/20/2023  DOB: 1951-09-20  Follow-Up Visit Note  CC: Elijio Miles., MD  Elijio Miles., MD  No diagnosis found.  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   The primary encounter diagnosis was Malignant neoplasm metastatic to bone [facility]). A diagnosis of Kappa light chain myeloma (HCC) was also pertinent to this visit.   Kappa light chain myeloma initially diagnosed in 2021 with extensive bone disease and pathologic fracture of the left scapula and left femur - s/p radiation. Hypermetabolic lesion in the right femoral head compatible with active myelomatous involvement in February 2024-  s/p radiation.   New hypermetabolic lytic lesion in the anterior T9 vertebral body suggestive of active myeloma (July 2024)-       Interval Since Last Radiation: 1 month   Indication for treatment: Palliative       Radiation treatment dates: 06/06/23 through 06/19/23  Site/dose: Thoracic spine - 24 Gy delivered in 8 Fx at 3 Gy/Fx  Mode/energy: 6X, 10X Technique: 3D / Photon   Narrative:  The patient returns today for routine follow-up. The patient tolerated radiation treatment relatively well. During her final treatment, the patient endorsed some discomfort in his throat with swallowing (prescribed carafate), increasing fatigue, nausea with one episode of emesis on his last day of treatment, frequent bowel movements, and some indigestion/acid reflux (managed with protonix).              Since his initial consultation date, the patient underwent a core bone marrow biopsy on 06/05/23 which showed cellular bone marrow with trilineage hematopoiesis and no evidence of  plasma cell neoplasm. Flow cytometric analysis also showed persistent abnormal clonal B-cell population.   His last monoclonal spikes also reported on 08/05 did not not show a monoclonal spike  in his blood. The Kappa light chain was 0.8 mg/dL.    The patient has continued to receive Faspro under Dr. Myna Hidalgo. He is tolerating this well without any significant side effects. He will receive his next dose of Zometa in October, and present for a restaging PET scan this coming November.   ***                  Allergies:  has No Known Allergies.  Meds: Current Outpatient Medications  Medication Sig Dispense Refill   ACETAMINOPHEN ER PO Take 1,000 mg by mouth in the morning, at noon, in the evening, and at bedtime.     aspirin EC 81 MG tablet Take 81 mg by mouth at bedtime.     calcium carbonate (OS-CAL - DOSED IN MG OF ELEMENTAL CALCIUM) 1250 (500 Ca) MG tablet Take 1 tablet (500 mg of elemental calcium total) by mouth 2 (two) times daily with a meal. 60 tablet 0   furosemide (LASIX) 20 MG tablet Take 1 tablet by mouth daily for 3 days, then take daily as needed thereafter for lower extremity edema (Patient not taking: Reported on 06/14/2023) 30 tablet 3   levETIRAcetam (KEPPRA) 500 MG tablet TAKE 1 TABLET BY MOUTH 2 TIMES A DAY 60 tablet 3   loperamide (IMODIUM) 2 MG capsule Take 1 capsule (2 mg total) by mouth as needed for diarrhea or loose stools. (Patient not taking: Reported on 06/14/2023) 30 capsule 0   MAGNESIUM PO Take 100 mg by mouth daily.     metoprolol tartrate (LOPRESSOR) 25 MG tablet Take 25  mg by mouth daily.     nitroGLYCERIN (NITROSTAT) 0.4 MG SL tablet Place 0.4 mg under the tongue every 5 (five) minutes as needed. (Patient not taking: Reported on 06/14/2023)     ondansetron (ZOFRAN) 8 MG tablet TAKE 1 TABLET BY MOUTH EVERY 8 HOURS AS NEEDED NAUSEA AND VOMITING (Patient taking differently: Take 8 mg by mouth every 8 (eight) hours as needed for nausea or vomiting.) 180 tablet 0   pantoprazole (PROTONIX) 40 MG tablet TAKE 1 TABLET BY MOUTH 2 TIMES A DAY BEFORE MEALS FOR ACID REFLUX 180 tablet 0   polyethylene glycol powder (GLYCOLAX/MIRALAX) 17 GM/SCOOP powder Mix 17g (1  capful) with 8 oz of favorite beverage and drink every morning     pregabalin (LYRICA) 200 MG capsule TAKE 1 CAPSULE BY MOUTH 2 TIMES DAILY 180 capsule 0   prochlorperazine (COMPAZINE) 10 MG tablet Take 1 tablet (10 mg total) by mouth every 6 (six) hours as needed for nausea or vomiting. (Patient not taking: Reported on 06/14/2023) 30 tablet 3   rosuvastatin (CRESTOR) 40 MG tablet Take 40 mg by mouth daily.     saccharomyces boulardii (FLORASTOR) 250 MG capsule Take 250 mg by mouth daily.     sucralfate (CARAFATE) 1 g tablet Take 1 tablet (1 g total) by mouth 4 (four) times daily -  with meals and at bedtime. Crush and dissolve in 10 mL's of warm water prior to swallowing, take 20 min prior to meals (Patient not taking: Reported on 07/12/2023) 60 tablet 1   tamsulosin (FLOMAX) 0.4 MG CAPS capsule TAKE 1 CAPSULE BY MOUTH EVERY NIGHT AT BEDTIME 90 capsule 0   traMADol (ULTRAM) 50 MG tablet TAKE 2 TABLETS BY MOUTH EVERY 6 HOURS ASNEEDED FOR PAIN 240 tablet 0   No current facility-administered medications for this encounter.    Physical Findings: The patient is in no acute distress. Patient is alert and oriented.  vitals were not taken for this visit. .  No significant changes. Lungs are clear to auscultation bilaterally. Heart has regular rate and rhythm. No palpable cervical, supraclavicular, or axillary adenopathy. Abdomen soft, non-tender, normal bowel sounds.   Lab Findings: Lab Results  Component Value Date   WBC 5.0 07/12/2023   HGB 12.1 (L) 07/12/2023   HCT 38.3 (L) 07/12/2023   MCV 93.6 07/12/2023   PLT 80 (L) 07/12/2023    Radiographic Findings: No results found.  Impression:  Kappa light chain myeloma initially diagnosed in 2021 with extensive bone disease and pathologic fracture of the left scapula and left femur - s/p radiation. Hypermetabolic lesion in the right femoral head compatible with active myelomatous involvement in February 2024-  s/p radiation.   New hypermetabolic  lytic lesion in the anterior T9 vertebral body suggestive of active myeloma (July 2024)-      The patient is recovering from the effects of radiation.  ***  Plan:  ***   *** minutes of total time was spent for this patient encounter, including preparation, face-to-face counseling with the patient and coordination of care, physical exam, and documentation of the encounter. ____________________________________  Billie Lade, PhD, MD  This document serves as a record of services personally performed by Antony Blackbird, MD. It was created on his behalf by Neena Rhymes, a trained medical scribe. The creation of this record is based on the scribe's personal observations and the provider's statements to them. This document has been checked and approved by the attending provider.

## 2023-07-19 NOTE — Progress Notes (Signed)
Radiation Oncology         [phone] ________________________________  Name: Bruce Smith MRN: [id number]  Date: 07/20/2023  DOB: [birth year]  End of Treatment Note  Diagnosis:  The encounter diagnosis was Kappa light chain myeloma (HCC).   The primary encounter diagnosis was Malignant neoplasm metastatic to bone [facility]). A diagnosis of Kappa light chain myeloma (HCC) was also pertinent to this visit.   Kappa light chain myeloma initially diagnosed in 2021 with extensive bone disease and pathologic fracture of the left scapula and left femur - s/p radiation. Hypermetabolic lesion in the right femoral head compatible with active myelomatous involvement in February 2024-  s/p radiation.   New hypermetabolic lytic lesion in the anterior T9 vertebral body suggestive of active myeloma (July 2024)-      Indication for treatment: Palliative        Radiation treatment dates: 06/06/23 through 06/19/23   Site/dose: Thoracic spine - 24 Gy delivered in 8 Fx at 3 Gy/Fx   Mode/energy: 6X, 10X  Technique: 3D / Photon   Narrative: The patient tolerated radiation treatment relatively well. During her final treatment, the patient endorsed some discomfort in his throat with swallowing (prescribed carafate), increasing fatigue, nausea with one episode of emesis on his last day of treatment, frequent bowel movements, and some indigestion/acid reflux (managed with protonix).  Plan: The patient has completed radiation treatment. The patient will return to radiation oncology clinic for routine followup in one month. I advised them to call or return sooner if they have any questions or concerns related to their recovery or treatment.  -----------------------------------  Billie Lade, PhD, MD  This document serves as a record of services personally performed by Antony Blackbird, MD. It was created on his behalf by Neena Rhymes, a trained medical scribe. The creation of this record is based on the scribe's  personal observations and the provider's statements to them. This document has been checked and approved by the attending provider.

## 2023-07-20 ENCOUNTER — Encounter: Payer: Self-pay | Admitting: Radiation Oncology

## 2023-07-20 ENCOUNTER — Ambulatory Visit
Admission: RE | Admit: 2023-07-20 | Discharge: 2023-07-20 | Disposition: A | Discharge status: Home / Self Care | Payer: Medicare Other | Source: Ambulatory Visit | Attending: Radiation Oncology | Admitting: Radiation Oncology

## 2023-07-20 VITALS — BP 104/63 | HR 60 | Resp 18 | Wt 204.0 lb

## 2023-07-20 DIAGNOSIS — C9000 Multiple myeloma not having achieved remission: Secondary | ICD-10-CM | POA: Diagnosis present

## 2023-07-20 DIAGNOSIS — Z923 Personal history of irradiation: Secondary | ICD-10-CM | POA: Insufficient documentation

## 2023-07-20 DIAGNOSIS — C419 Malignant neoplasm of bone and articular cartilage, unspecified: Secondary | ICD-10-CM

## 2023-07-20 DIAGNOSIS — C7951 Secondary malignant neoplasm of bone: Secondary | ICD-10-CM

## 2023-07-20 NOTE — Progress Notes (Addendum)
Bruce Smith presents today for  a follow-up appointment. treatment 06-06-23 to 06-19-23 Patient reports pain in his left  leg and lower back. Patient ambulates with a cane. Denies any falls. Report diarrhea sometime. Reports that his appiete is good. Vitals:   07/20/23 1107  BP: 104/63  Pulse: 60  Resp: 18  SpO2: 96%  Weight: 92.5 kg

## 2023-07-24 ENCOUNTER — Other Ambulatory Visit: Payer: Self-pay

## 2023-07-24 ENCOUNTER — Other Ambulatory Visit: Payer: Self-pay | Admitting: Hematology & Oncology

## 2023-07-24 DIAGNOSIS — E440 Moderate protein-calorie malnutrition: Secondary | ICD-10-CM

## 2023-07-24 DIAGNOSIS — C9000 Multiple myeloma not having achieved remission: Secondary | ICD-10-CM

## 2023-08-02 ENCOUNTER — Emergency Department (HOSPITAL_COMMUNITY)
Admission: EM | Admit: 2023-08-02 | Discharge: 2023-08-02 | Disposition: A | Discharge status: Home / Self Care | Payer: Medicare Other | Attending: Emergency Medicine | Admitting: Emergency Medicine

## 2023-08-02 ENCOUNTER — Emergency Department (HOSPITAL_COMMUNITY): Payer: Medicare Other

## 2023-08-02 ENCOUNTER — Encounter (HOSPITAL_COMMUNITY): Payer: Self-pay

## 2023-08-02 ENCOUNTER — Other Ambulatory Visit: Payer: Self-pay

## 2023-08-02 DIAGNOSIS — Z8579 Personal history of other malignant neoplasms of lymphoid, hematopoietic and related tissues: Secondary | ICD-10-CM | POA: Insufficient documentation

## 2023-08-02 DIAGNOSIS — R531 Weakness: Secondary | ICD-10-CM | POA: Diagnosis not present

## 2023-08-02 DIAGNOSIS — Z1152 Encounter for screening for COVID-19: Secondary | ICD-10-CM | POA: Insufficient documentation

## 2023-08-02 DIAGNOSIS — Z7982 Long term (current) use of aspirin: Secondary | ICD-10-CM | POA: Insufficient documentation

## 2023-08-02 DIAGNOSIS — R509 Fever, unspecified: Secondary | ICD-10-CM | POA: Diagnosis not present

## 2023-08-02 DIAGNOSIS — R059 Cough, unspecified: Secondary | ICD-10-CM | POA: Insufficient documentation

## 2023-08-02 DIAGNOSIS — R42 Dizziness and giddiness: Secondary | ICD-10-CM | POA: Diagnosis not present

## 2023-08-02 DIAGNOSIS — Z951 Presence of aortocoronary bypass graft: Secondary | ICD-10-CM | POA: Insufficient documentation

## 2023-08-02 DIAGNOSIS — J029 Acute pharyngitis, unspecified: Secondary | ICD-10-CM | POA: Diagnosis present

## 2023-08-02 DIAGNOSIS — J439 Emphysema, unspecified: Secondary | ICD-10-CM | POA: Diagnosis not present

## 2023-08-02 LAB — RESP PANEL BY RT-PCR (RSV, FLU A&B, COVID)  RVPGX2
Influenza A by PCR: NEGATIVE
Influenza B by PCR: NEGATIVE
Resp Syncytial Virus by PCR: NEGATIVE
SARS Coronavirus 2 by RT PCR: NEGATIVE

## 2023-08-02 LAB — URINALYSIS, ROUTINE W REFLEX MICROSCOPIC
Bacteria, UA: NONE SEEN
Bilirubin Urine: NEGATIVE
Glucose, UA: NEGATIVE mg/dL
Ketones, ur: NEGATIVE mg/dL
Leukocytes,Ua: NEGATIVE
Nitrite: NEGATIVE
Protein, ur: NEGATIVE mg/dL
Specific Gravity, Urine: 1.005 (ref 1.005–1.030)
pH: 6 (ref 5.0–8.0)

## 2023-08-02 LAB — COMPREHENSIVE METABOLIC PANEL
ALT: 19 U/L (ref 0–44)
AST: 18 U/L (ref 15–41)
Albumin: 3.5 g/dL (ref 3.5–5.0)
Alkaline Phosphatase: 69 U/L (ref 38–126)
Anion gap: 13 (ref 5–15)
BUN: 15 mg/dL (ref 8–23)
CO2: 21 mmol/L — ABNORMAL LOW (ref 22–32)
Calcium: 9 mg/dL (ref 8.9–10.3)
Chloride: 102 mmol/L (ref 98–111)
Creatinine, Ser: 1.16 mg/dL (ref 0.61–1.24)
GFR, Estimated: >60 mL/min (ref >60)
Glucose, Bld: 121 mg/dL — ABNORMAL HIGH (ref 70–99)
Potassium: 4.3 mmol/L (ref 3.5–5.1)
Sodium: 136 mmol/L (ref 135–145)
Total Bilirubin: 0.6 mg/dL (ref 0.3–1.2)
Total Protein: 5.8 g/dL — ABNORMAL LOW (ref 6.5–8.1)

## 2023-08-02 LAB — CBC
HCT: 37.8 % — ABNORMAL LOW (ref 39.0–52.0)
Hemoglobin: 12.4 g/dL — ABNORMAL LOW (ref 13.0–17.0)
MCH: 30.4 pg (ref 26.0–34.0)
MCHC: 32.8 g/dL (ref 30.0–36.0)
MCV: 92.6 fL (ref 80.0–100.0)
Platelets: 78 10*3/uL — ABNORMAL LOW (ref 150–400)
RBC: 4.08 MIL/uL — ABNORMAL LOW (ref 4.22–5.81)
RDW: 15.6 % — ABNORMAL HIGH (ref 11.5–15.5)
WBC: 11.8 10*3/uL — ABNORMAL HIGH (ref 4.0–10.5)
nRBC: 0 % (ref 0.0–0.2)

## 2023-08-02 LAB — I-STAT CG4 LACTIC ACID, ED: Lactic Acid, Venous: 0.9 mmol/L (ref 0.5–1.9)

## 2023-08-02 MED ORDER — AMOXICILLIN-POT CLAVULANATE 875-125 MG PO TABS: 1.0000 | ORAL_TABLET | Freq: Two times a day (BID) | ORAL | 0 refills | Status: DC

## 2023-08-02 MED ORDER — DOXYCYCLINE HYCLATE 100 MG PO TABS
100.0000 mg | ORAL_TABLET | Freq: Once | ORAL | Status: AC
  Administered 2023-08-02: 100 mg via ORAL
  Filled 2023-08-02: qty 1

## 2023-08-02 MED ORDER — SODIUM CHLORIDE 0.9 % IV SOLN
1.0000 g | Freq: Once | INTRAVENOUS | Status: AC
  Administered 2023-08-02: 1 g via INTRAVENOUS
  Filled 2023-08-02: qty 10

## 2023-08-02 MED ORDER — SODIUM CHLORIDE 0.9 % IV BOLUS
1000.0000 mL | Freq: Once | INTRAVENOUS | Status: AC
  Administered 2023-08-02: 1000 mL via INTRAVENOUS

## 2023-08-02 NOTE — ED Provider Triage Note (Signed)
Emergency Medicine Provider Triage Evaluation Note  Bruce Smith , a 72 y.o. male  was evaluated in triage.  Pt complains of weakness, fever, body aches x 2 days. Highest temp at home 103.6 F, yesterday. Went to PCP and had fever of 100 F, was given tylenol.  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  Appropriate orders placed.  Bruce Smith was informed that the remainder of the evaluation will be completed by another provider, this initial triage assessment does not replace that evaluation, and the importance of remaining in the ED until their evaluation is complete.  Workup initiated   Bruce Smith T, PA-C 08/02/23 1418

## 2023-08-02 NOTE — ED Triage Notes (Signed)
Pt c/o generalized weakness, fever 103, bodyachesx2d. Pt went to dr office this morning and had fever 100 and was given tylenol.

## 2023-08-02 NOTE — Discharge Instructions (Addendum)
You may be experiencing a virus or viral syndrome.  This can cause coughing and sore throat and weakness.  However, out of precaution we did prescribe an antibiotic to cover for possible pneumonia.  There was no clear sign of pneumonia on your x-ray, but some people have more subtle pneumonia that cannot be seen on a single x-ray.  I thought this was reasonable to prescribe an antibiotic given your immunosuppressed status.  Please continue taking these antibiotics beginning tomorrow.  Follow-up in your doctor's office.  We did discuss observation in the hospital, but you preferred to go home.  Continue drinking plenty of water at home.  If you have worsening weakness, dizziness, confusion, please call 911 or return to the hospital

## 2023-08-02 NOTE — ED Provider Notes (Signed)
[place] EMERGENCY DEPARTMENT AT [facility] Provider Note   CSN: [id number] Arrival date & time: 08/02/23  1300     History  No chief complaint on file.   Bruce Smith is a 72 y.o. male with history of multiple myeloma presenting to ED with fever, sore throat, body aches for 2 days.  Patient reports he began with sore throat is also had a cough for 2 days.  He went to his PCP and was noted to have a temperature of 100 Fahrenheit was given Tylenol.  He says he feels very malaise.  He felt lightheaded earlier today.  He says a blood pressure tends to "run on the low side".  He denies vomiting, diarrhea, abdominal pain.  He reports poor appetite.  He denies headaches.  He denies dysuria or hematuria.  HPI     Home Medications Prior to Admission medications   Medication Sig Start Date End Date Taking? Authorizing Provider  amoxicillin-clavulanate (AUGMENTIN) 875-125 MG tablet Take 1 tablet by mouth every 12 (twelve) hours for 7 days. 08/03/23 08/10/23 Yes Terald Sleeper, MD  ACETAMINOPHEN ER PO Take 1,000 mg by mouth in the morning, at noon, in the evening, and at bedtime.    [provider]  aspirin EC 81 MG tablet Take 81 mg by mouth at bedtime.    [provider]  calcium carbonate (OS-CAL - DOSED IN MG OF ELEMENTAL CALCIUM) 1250 (500 Ca) MG tablet Take 1 tablet (500 mg of elemental calcium total) by mouth 2 (two) times daily with a meal. Patient not taking: Reported on 07/20/2023 03/17/21   Love, Evlyn Kanner, PA-C  furosemide (LASIX) 20 MG tablet Take 1 tablet by mouth daily for 3 days, then take daily as needed thereafter for lower extremity edema Patient not taking: Reported on 06/14/2023 04/03/23   Sharlene Dory, PA-C  levETIRAcetam (KEPPRA) 500 MG tablet TAKE 1 TABLET BY MOUTH 2 TIMES A DAY 07/24/23   Josph Macho, MD  loperamide (IMODIUM) 2 MG capsule Take 1 capsule (2 mg total) by mouth as needed for diarrhea or loose stools. Patient not taking:  Reported on 06/14/2023 03/17/21   Love, Evlyn Kanner, PA-C  MAGNESIUM PO Take 100 mg by mouth daily. 03/01/22   [provider]  metoprolol tartrate (LOPRESSOR) 25 MG tablet Take 25 mg by mouth daily.    [provider]  nitroGLYCERIN (NITROSTAT) 0.4 MG SL tablet Place 0.4 mg under the tongue every 5 (five) minutes as needed. Patient not taking: Reported on 06/14/2023 06/09/23   [provider]  ondansetron (ZOFRAN) 8 MG tablet TAKE 1 TABLET BY MOUTH EVERY 8 HOURS AS NEEDED NAUSEA AND VOMITING Patient not taking: Reported on 07/20/2023 09/28/22   Antony Blackbird, MD  pantoprazole (PROTONIX) 40 MG tablet TAKE 1 TABLET BY MOUTH 2 TIMES A DAY BEFORE MEALS FOR ACID REFLUX 07/24/23   Josph Macho, MD  polyethylene glycol powder (GLYCOLAX/MIRALAX) 17 GM/SCOOP powder Mix 17g (1 capful) with 8 oz of favorite beverage and drink every morning    [provider]  pregabalin (LYRICA) 200 MG capsule TAKE 1 CAPSULE BY MOUTH 2 TIMES DAILY 02/21/23   Josph Macho, MD  prochlorperazine (COMPAZINE) 10 MG tablet Take 1 tablet (10 mg total) by mouth every 6 (six) hours as needed for nausea or vomiting. Patient not taking: Reported on 06/14/2023 10/05/22   Josph Macho, MD  rosuvastatin (CRESTOR) 40 MG tablet Take 40 mg by mouth daily. 05/18/23  [provider]  saccharomyces boulardii (FLORASTOR) 250 MG capsule Take 250 mg by mouth daily. 03/17/21   [provider]  sucralfate (CARAFATE) 1 g tablet Take 1 tablet (1 g total) by mouth 4 (four) times daily -  with meals and at bedtime. Crush and dissolve in 10 mL's of warm water prior to swallowing, take 20 min prior to meals Patient not taking: Reported on 07/12/2023 06/19/23   Antony Blackbird, MD  tamsulosin (FLOMAX) 0.4 MG CAPS capsule TAKE 1 CAPSULE BY MOUTH EVERY NIGHT AT BEDTIME 06/30/23   Josph Macho, MD  traMADol (ULTRAM) 50 MG tablet TAKE 2 TABLETS BY MOUTH EVERY 6 HOURS ASNEEDED FOR PAIN 07/07/23   Josph Macho,  MD      Allergies    Patient has no known allergies.    Review of Systems   Review of Systems  Physical Exam Updated Vital Signs BP (!) 139/44   Pulse 71   Temp 98.1 F (36.7 C) (Oral)   Resp 14   Ht 5\' 8"  (1.727 m)   Wt 92.5 kg   SpO2 96%   BMI 31.01 kg/m  Physical Exam Constitutional:      General: He is not in acute distress. HENT:     Head: Normocephalic and atraumatic.     Comments: No parapharyngeal or peritonsillar exudates Uvula is midline Eyes:     Conjunctiva/sclera: Conjunctivae normal.     Pupils: Pupils are equal, round, and reactive to light.  Cardiovascular:     Rate and Rhythm: Normal rate and regular rhythm.  Pulmonary:     Effort: Pulmonary effort is normal. No respiratory distress.     Breath sounds: Normal breath sounds.     Comments: Dry cough on exam Abdominal:     General: There is no distension.     Tenderness: There is no abdominal tenderness.  Skin:    General: Skin is warm and dry.  Neurological:     General: No focal deficit present.     Mental Status: He is alert. Mental status is at baseline.  Psychiatric:        Mood and Affect: Mood normal.        Behavior: Behavior normal.     ED Results / Procedures / Treatments   Labs (all labs ordered are listed, but only abnormal results are displayed) Labs Reviewed  COMPREHENSIVE METABOLIC PANEL - Abnormal; Notable for the following components:      Result Value   CO2 21 (*)    Glucose, Bld 121 (*)    Total Protein 5.8 (*)    All other components within normal limits  CBC - Abnormal; Notable for the following components:   WBC 11.8 (*)    RBC 4.08 (*)    Hemoglobin 12.4 (*)    HCT 37.8 (*)    RDW 15.6 (*)    Platelets 78 (*)    All other components within normal limits  URINALYSIS, ROUTINE W REFLEX MICROSCOPIC - Abnormal; Notable for the following components:   Color, Urine STRAW (*)    Hgb urine dipstick SMALL (*)    All other components within normal limits  RESP PANEL BY  RT-PCR (RSV, FLU A&B, COVID)  RVPGX2  I-STAT CG4 LACTIC ACID, ED    EKG EKG Interpretation Date/Time:  Wednesday August 02 2023 13:32:09 EDT Ventricular Rate:  69 PR Interval:  180 QRS Duration:  80 QT Interval:  376 QTC Calculation: 402 R Axis:   -28  Text  Interpretation: Normal sinus rhythm Possible Inferior infarct , age undetermined When compared with ECG of 14-Mar-2023 06:47, PREVIOUS ECG IS PRESENT Confirmed by Alvester Chou [phone]) on 08/02/2023 5:08:25 PM  Radiology DG Chest 1 View  Result Date: 08/02/2023 CLINICAL DATA:  Generalized weakness and fever EXAM: CHEST  1 VIEW COMPARISON:  04/12/2023 FINDINGS: Previous median sternotomy and CABG. Heart size is normal. Evidence of emphysema and chronic pulmonary scarring but no sign of active infiltrate, mass, effusion or acute collapse. No acute bone finding. Chronic post traumatic deformity of the left scapula. IMPRESSION: No active disease. Previous CABG. Emphysema and chronic pulmonary scarring. Electronically Signed   By: Paulina Fusi M.D.   On: 08/02/2023 15:02    Procedures Procedures    Medications Ordered in ED Medications  sodium chloride 0.9 % bolus 1,000 mL (0 mLs Intravenous Stopped 08/02/23 1834)  cefTRIAXone (ROCEPHIN) 1 g in sodium chloride 0.9 % 100 mL IVPB (0 g Intravenous Stopped 08/02/23 1834)  doxycycline (VIBRA-TABS) tablet 100 mg (100 mg Oral Given 08/02/23 1800)    ED Course/ Medical Decision Making/ A&P Clinical Course as of 08/02/23 2204  Wed Aug 02, 2023  2010 Reassessed, blood pressure remained stable with MAP over 65, and he is adamant and eager to leave the hospital.  We discussed again the option of observation in the hospital but he is wanting to go home.  His family is comfortable taking him home.  Return precautions were discussed.  Augmentin prescribed for potential coverage for pneumonia that may not be well-visualized on chest x-ray, given the patient's immunosuppressed status.  They are in  agreement. [MT]    Clinical Course User Index [MT] Aalivia Mcgraw, Kermit Balo, MD                                 Medical Decision Making Amount and/or Complexity of Data Reviewed Labs: ordered.  Risk Prescription drug management.   This patient presents to the ED with concern for generalized weakness, cough, sore throat. This involves an extensive number of treatment options, and is a complaint that carries with it a high risk of complications and morbidity.  The differential diagnosis includes viral URI versus bacterial infection versus mono versus other  Co-morbidities that complicate the patient evaluation: Immunosuppressed  Additional history obtained from family members at bedside   I ordered and personally interpreted labs.  The pertinent results include: White blood cell count 11.8.  Lactate within normal was.  COVID and flu are negative.  UA unremarkable.  CMP largely unremarkable  I ordered imaging studies including x-ray of the chest I independently visualized and interpreted imaging which showed no acute abnormality I agree with the radiologist interpretation  The patient was maintained on a cardiac monitor.  I personally viewed and interpreted the cardiac monitored which showed an underlying rhythm of: Sinus rhythm  Per my interpretation the patient's ECG shows no acute ischemic findings  I ordered medication including IV antibiotics for potential community pneumonia; IV fluid bolus for borderline hypotension  I have reviewed the patients home medicines and have made adjustments as needed  Test Considered: Low suspicion for meningitis, intra-abdominal infection.  No indication for LP or abdominal imaging at this time   After the interventions noted above, I reevaluated the patient and found that they have: improved  Dispostion:  After consideration of the diagnostic results and the patients response to treatment, I feel that the patent would benefit from  close  outpatient follow-up.  Hospitalization was considered but the patient refused to stay in the hospital.  Overall he is clinically well-appearing, with stable vital signs, to consider outpatient treatment.  It is possible this is a viral syndrome, but also reasonable to treat with antibiotics given immunosuppressed status, mildly elevated white blood cell count compared to baseline, and productive coughing.  Will begin treatment with doxycycline at home for community pneumonia.  Patient and family are comfortable with this plan         Final Clinical Impression(s) / ED Diagnoses Final diagnoses:  Weakness  Cough, unspecified type    Rx / DC Orders ED Discharge Orders          Ordered    amoxicillin-clavulanate (AUGMENTIN) 875-125 MG tablet  Every 12 hours        08/02/23 2010              Terald Sleeper, MD 08/02/23 2204

## 2023-08-03 ENCOUNTER — Inpatient Hospital Stay: Payer: Medicare Other | Attending: Hematology & Oncology | Admitting: Hematology & Oncology

## 2023-08-03 ENCOUNTER — Other Ambulatory Visit: Payer: Self-pay | Admitting: Hematology & Oncology

## 2023-08-03 ENCOUNTER — Inpatient Hospital Stay: Payer: Medicare Other

## 2023-08-03 ENCOUNTER — Encounter: Payer: Self-pay | Admitting: Hematology & Oncology

## 2023-08-03 ENCOUNTER — Telehealth: Payer: Self-pay | Admitting: *Deleted

## 2023-08-03 ENCOUNTER — Other Ambulatory Visit: Payer: Self-pay

## 2023-08-03 ENCOUNTER — Ambulatory Visit (HOSPITAL_BASED_OUTPATIENT_CLINIC_OR_DEPARTMENT_OTHER)
Admission: RE | Admit: 2023-08-03 | Discharge: 2023-08-03 | Disposition: A | Discharge status: Home / Self Care | Payer: Medicare Other | Source: Ambulatory Visit | Attending: Hematology & Oncology | Admitting: Hematology & Oncology

## 2023-08-03 VITALS — BP 129/55 | HR 62 | Resp 17

## 2023-08-03 VITALS — BP 113/45 | HR 79 | Temp 99.9°F | Resp 18

## 2023-08-03 DIAGNOSIS — R0602 Shortness of breath: Secondary | ICD-10-CM | POA: Diagnosis present

## 2023-08-03 DIAGNOSIS — B59 Pneumocystosis: Secondary | ICD-10-CM

## 2023-08-03 DIAGNOSIS — C9000 Multiple myeloma not having achieved remission: Secondary | ICD-10-CM | POA: Insufficient documentation

## 2023-08-03 DIAGNOSIS — J189 Pneumonia, unspecified organism: Secondary | ICD-10-CM | POA: Diagnosis not present

## 2023-08-03 DIAGNOSIS — Z923 Personal history of irradiation: Secondary | ICD-10-CM | POA: Insufficient documentation

## 2023-08-03 DIAGNOSIS — Z951 Presence of aortocoronary bypass graft: Secondary | ICD-10-CM | POA: Diagnosis not present

## 2023-08-03 DIAGNOSIS — C419 Malignant neoplasm of bone and articular cartilage, unspecified: Secondary | ICD-10-CM | POA: Diagnosis not present

## 2023-08-03 DIAGNOSIS — R509 Fever, unspecified: Secondary | ICD-10-CM | POA: Diagnosis present

## 2023-08-03 DIAGNOSIS — R918 Other nonspecific abnormal finding of lung field: Secondary | ICD-10-CM | POA: Insufficient documentation

## 2023-08-03 DIAGNOSIS — Z9484 Stem cells transplant status: Secondary | ICD-10-CM | POA: Insufficient documentation

## 2023-08-03 MED ORDER — IPRATROPIUM-ALBUTEROL 20-100 MCG/ACT IN AERS
1.0000 | INHALATION_SPRAY | Freq: Four times a day (QID) | RESPIRATORY_TRACT | 0 refills | Status: DC | PRN
Start: 2023-08-03 — End: 2024-07-03

## 2023-08-03 MED ORDER — IOHEXOL 350 MG/ML SOLN
75.0000 mL | Freq: Once | INTRAVENOUS | Status: AC | PRN
  Administered 2023-08-03: 75 mL via INTRAVENOUS

## 2023-08-03 MED ORDER — LORAZEPAM 2 MG/ML IJ SOLN
0.5000 mg | Freq: Once | INTRAMUSCULAR | Status: AC
  Administered 2023-08-03: 0.5 mg via INTRAVENOUS
  Filled 2023-08-03: qty 1

## 2023-08-03 MED ORDER — CLARITHROMYCIN 500 MG PO TABS: 500.0000 mg | ORAL_TABLET | Freq: Two times a day (BID) | ORAL | 0 refills | Status: DC

## 2023-08-03 MED ORDER — SODIUM CHLORIDE 0.9 % IV SOLN
10.0000 mg | Freq: Once | INTRAVENOUS | Status: AC
  Administered 2023-08-03: 10 mg via INTRAVENOUS
  Filled 2023-08-03: qty 10

## 2023-08-03 MED ORDER — SODIUM CHLORIDE 0.9 % IV SOLN: INTRAVENOUS | Status: DC

## 2023-08-03 MED ORDER — DEXTROSE 5 % IV SOLN
2.0000 g | INTRAVENOUS | Status: DC
  Administered 2023-08-03: 2 g via INTRAVENOUS
  Filled 2023-08-03: qty 20

## 2023-08-03 NOTE — Telephone Encounter (Signed)
Call received from patient's wife Cala Bradford to inform Dr Myna Hidalgo that pt was in the ER yesterday with a temp of 103.0 and that this morning he has increased weakness, trembling and slight SOB.  Dr Myna Hidalgo notified.  Cala Bradford notified per order of Dr. Myna Hidalgo to bring pt in for a CT angiogram now and to come up to see Dr Myna Hidalgo after CT angiogram. Message sent to scheduling and order placed. Kim verbalizes an understanding to bring pt to [facility] radiology and to see Dr E now.

## 2023-08-03 NOTE — Patient Instructions (Signed)
Dehydration, Adult Dehydration is a condition in which there is not enough water or other fluids in the body. This happens when a person loses more fluids than they take in. Important organs cannot work right without the right amount of fluids. Any loss of fluids from the body can cause dehydration. Dehydration can be mild, worse, or very bad. It should be treated right away to keep it from getting very bad. What are the causes? Conditions that cause loss of water in the body. They include: Watery poop (diarrhea). Vomiting. Sweating a lot. Fever. Infection. Peeing (urinating) a lot. Not drinking enough fluids. Certain medicines, such as medicines that take extra fluid out of the body (diuretics). Lack of safe drinking water. Not being able to get enough water and food. What increases the risk? Having a long-term (chronic) illness that has not been treated the right way, such as: Diabetes. Heart disease. Kidney disease. Being 65 years of age or older. Having a disability. Living in a place that is high above the ground or sea (high in altitude). The thinner, drier air causes more fluid loss. Doing exercises that put stress on your body for a long time. Being active when in hot places. What are the signs or symptoms? Symptoms of dehydration depend on how bad it is. Mild or worse dehydration Thirst. Dry lips or dry mouth. Feeling dizzy or light-headed. Muscle cramps. Passing little pee or dark pee. Pee may be the color of tea. Headache. Very bad dehydration Changes in skin. Skin may: Be cold to the touch (clammy). Be blotchy or pale. Not go back to normal right after you pinch it and let it go. Little or no tears, pee, or sweat. Fast breathing. Low blood pressure. Weak pulse. Pulse that is more than 100 beats a minute when you are sitting still. Other changes, such as: Feeling very thirsty. Eyes that look hollow (sunken). Cold hands and feet. Being confused. Being very  tired (lethargic) or having trouble waking from sleep. Losing weight. Loss of consciousness. How is this treated? Treatment for this condition depends on how bad your dehydration is. Treatment should start right away. Do not wait until your condition gets very bad. Very bad dehydration is an emergency. You will need to go to a hospital. Mild or worse dehydration can be treated at home. You may be asked to: Drink more fluids. Drink an oral rehydration solution (ORS). This drink gives you the right amount of fluids, salts, and minerals (electrolytes). Very bad dehydration can be treated: With fluids through an IV tube. By correcting low levels of electrolytes in the body. By treating the problem that caused your dehydration. Follow these instructions at home: Oral rehydration solution If told by your doctor, drink an ORS: Make an ORS. Use instructions on the package. Start by drinking small amounts, about  cup (120 mL) every 5-10 minutes. Slowly drink more until you have had the amount that your doctor said to have.  Eating and drinking  Drink enough clear fluid to keep your pee pale yellow. If you were told to drink an ORS, finish the ORS first. Then, start slowly drinking other clear fluids. Drink fluids such as: Water. Do not drink only water. Doing that can make the salt (sodium) level in your body get too low. Water from ice chips you suck on. Fruit juice that you have added water to (diluted). Low-calorie sports drinks. Eat foods that have the right amounts of salts and minerals, such as bananas, oranges, potatoes,   tomatoes, or spinach. Do not drink alcohol. Avoid drinks that have caffeine or sugar. These include:: High-calorie sports drinks. Fruit juice that you did not add water to. Soda. Coffee or energy drinks. Avoid foods that are greasy or have a lot of fat or sugar. General instructions Take over-the-counter and prescription medicines only as told by your doctor. Do  not take sodium tablets. Doing that can make the salt level in your body get too high. Return to your normal activities as told by your doctor. Ask your doctor what activities are safe for you. Keep all follow-up visits. Your doctor may check and change your treatment. Contact a doctor if: You have pain in your belly (abdomen) and the pain: Gets worse. Stays in one place. You have a rash. You have a stiff neck. You get angry or annoyed more easily than normal. You are more tired or have a harder time waking than normal. You feel weak or dizzy. You feel very thirsty. Get help right away if: You have any symptoms of very bad dehydration. You vomit every time you eat or drink. Your vomiting gets worse, does not go away, or you vomit blood or green stuff. You are getting treatment, but symptoms are getting worse. You have a fever. You have a very bad headache. You have: Diarrhea that gets worse or does not go away. Blood in your poop (stool). This may cause poop to look black and tarry. No pee in 6-8 hours. Only a small amount of pee in 6-8 hours, and the pee is very dark. You have trouble breathing. These symptoms may be an emergency. Get help right away. Call 911. Do not wait to see if the symptoms will go away. Do not drive yourself to the hospital. This information is not intended to replace advice given to you by your health care provider. Make sure you discuss any questions you have with your health care provider. Document Revised: 05/16/2022 Document Reviewed: 05/16/2022 Elsevier Patient Education  2024 Elsevier Inc.  
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
## 2023-08-04 ENCOUNTER — Inpatient Hospital Stay: Payer: Medicare Other

## 2023-08-04 VITALS — BP 107/50 | HR 59 | Temp 97.7°F | Resp 19

## 2023-08-04 DIAGNOSIS — C9000 Multiple myeloma not having achieved remission: Secondary | ICD-10-CM | POA: Diagnosis not present

## 2023-08-04 DIAGNOSIS — C419 Malignant neoplasm of bone and articular cartilage, unspecified: Secondary | ICD-10-CM

## 2023-08-04 MED ORDER — DIPHENHYDRAMINE HCL 25 MG PO CAPS
25.0000 mg | ORAL_CAPSULE | Freq: Once | ORAL | Status: AC
  Administered 2023-08-04: 25 mg via ORAL
  Filled 2023-08-04: qty 1

## 2023-08-04 MED ORDER — SODIUM CHLORIDE 0.9 % IV SOLN: INTRAVENOUS | Status: DC

## 2023-08-04 MED ORDER — ACETAMINOPHEN 325 MG PO TABS
650.0000 mg | ORAL_TABLET | Freq: Once | ORAL | Status: AC
  Administered 2023-08-04: 650 mg via ORAL
  Filled 2023-08-04: qty 2

## 2023-08-04 MED ORDER — DEXTROSE 5 % IV SOLN
2.0000 g | Freq: Once | INTRAVENOUS | Status: AC
  Administered 2023-08-04: 2 g via INTRAVENOUS
  Filled 2023-08-04: qty 20

## 2023-08-04 MED ORDER — IMMUNE GLOBULIN (HUMAN) 40 GM/400ML IV SOLN
40.0000 g | Freq: Once | INTRAVENOUS | Status: AC
  Administered 2023-08-04: 40 g via INTRAVENOUS
  Filled 2023-08-04: qty 400

## 2023-08-04 MED ORDER — SODIUM CHLORIDE 0.9 % IV SOLN
10.0000 mg | Freq: Once | INTRAVENOUS | Status: AC
  Administered 2023-08-04: 10 mg via INTRAVENOUS
  Filled 2023-08-04: qty 10

## 2023-08-04 MED ORDER — DEXTROSE 5 % IV SOLN: Freq: Once | INTRAVENOUS | Status: AC

## 2023-08-04 NOTE — Patient Instructions (Signed)

## 2023-08-08 ENCOUNTER — Other Ambulatory Visit: Payer: Self-pay | Admitting: Hematology & Oncology

## 2023-08-08 DIAGNOSIS — C9000 Multiple myeloma not having achieved remission: Secondary | ICD-10-CM

## 2023-08-09 ENCOUNTER — Inpatient Hospital Stay: Payer: Medicare Other

## 2023-08-09 ENCOUNTER — Inpatient Hospital Stay: Payer: Medicare Other | Admitting: Hematology & Oncology

## 2023-08-21 ENCOUNTER — Other Ambulatory Visit: Payer: Self-pay | Admitting: Interventional Cardiology

## 2023-09-05 ENCOUNTER — Other Ambulatory Visit: Payer: Self-pay | Admitting: Hematology & Oncology

## 2023-09-05 DIAGNOSIS — C9000 Multiple myeloma not having achieved remission: Secondary | ICD-10-CM

## 2023-09-06 ENCOUNTER — Inpatient Hospital Stay: Payer: Medicare Other | Attending: Hematology & Oncology

## 2023-09-06 ENCOUNTER — Inpatient Hospital Stay: Payer: Medicare Other

## 2023-09-06 ENCOUNTER — Inpatient Hospital Stay (HOSPITAL_BASED_OUTPATIENT_CLINIC_OR_DEPARTMENT_OTHER): Payer: Medicare Other | Admitting: Hematology & Oncology

## 2023-09-06 ENCOUNTER — Other Ambulatory Visit: Payer: Self-pay

## 2023-09-06 ENCOUNTER — Encounter: Payer: Self-pay | Admitting: Hematology & Oncology

## 2023-09-06 VITALS — BP 130/54 | HR 57 | Temp 97.9°F | Resp 20 | Ht 68.0 in | Wt 207.1 lb

## 2023-09-06 DIAGNOSIS — Z9484 Stem cells transplant status: Secondary | ICD-10-CM | POA: Insufficient documentation

## 2023-09-06 DIAGNOSIS — C9000 Multiple myeloma not having achieved remission: Secondary | ICD-10-CM | POA: Insufficient documentation

## 2023-09-06 DIAGNOSIS — Z5112 Encounter for antineoplastic immunotherapy: Secondary | ICD-10-CM | POA: Insufficient documentation

## 2023-09-06 DIAGNOSIS — C419 Malignant neoplasm of bone and articular cartilage, unspecified: Secondary | ICD-10-CM

## 2023-09-06 DIAGNOSIS — Z923 Personal history of irradiation: Secondary | ICD-10-CM | POA: Diagnosis not present

## 2023-09-06 DIAGNOSIS — D801 Nonfamilial hypogammaglobulinemia: Secondary | ICD-10-CM | POA: Diagnosis not present

## 2023-09-06 LAB — CMP (CANCER CENTER ONLY)
ALT: 16 U/L (ref 0–44)
AST: 15 U/L (ref 15–41)
Albumin: 4 g/dL (ref 3.5–5.0)
Alkaline Phosphatase: 81 U/L (ref 38–126)
Anion gap: 7 (ref 5–15)
BUN: 17 mg/dL (ref 8–23)
CO2: 28 mmol/L (ref 22–32)
Calcium: 9 mg/dL (ref 8.9–10.3)
Chloride: 107 mmol/L (ref 98–111)
Creatinine: 1.15 mg/dL (ref 0.61–1.24)
GFR, Estimated: >60 mL/min (ref >60)
Glucose, Bld: 104 mg/dL — ABNORMAL HIGH (ref 70–99)
Potassium: 4.1 mmol/L (ref 3.5–5.1)
Sodium: 142 mmol/L (ref 135–145)
Total Bilirubin: 0.3 mg/dL (ref <1.2)
Total Protein: 5.8 g/dL — ABNORMAL LOW (ref 6.5–8.1)

## 2023-09-06 LAB — CBC WITH DIFFERENTIAL (CANCER CENTER ONLY)
Abs Immature Granulocytes: 0.01 10*3/uL (ref 0.00–0.07)
Basophils Absolute: 0 10*3/uL (ref 0.0–0.1)
Basophils Relative: 0 %
Eosinophils Absolute: 0.1 10*3/uL (ref 0.0–0.5)
Eosinophils Relative: 2 %
HCT: 36.6 % — ABNORMAL LOW (ref 39.0–52.0)
Hemoglobin: 11.7 g/dL — ABNORMAL LOW (ref 13.0–17.0)
Immature Granulocytes: 0 %
Lymphocytes Relative: 12 %
Lymphs Abs: 0.5 10*3/uL — ABNORMAL LOW (ref 0.7–4.0)
MCH: 30.1 pg (ref 26.0–34.0)
MCHC: 32 g/dL (ref 30.0–36.0)
MCV: 94.1 fL (ref 80.0–100.0)
Monocytes Absolute: 0.6 10*3/uL (ref 0.1–1.0)
Monocytes Relative: 16 %
Neutro Abs: 2.8 10*3/uL (ref 1.7–7.7)
Neutrophils Relative %: 70 %
Platelet Count: 95 10*3/uL — ABNORMAL LOW (ref 150–400)
RBC: 3.89 MIL/uL — ABNORMAL LOW (ref 4.22–5.81)
RDW: 15.3 % (ref 11.5–15.5)
WBC Count: 4 10*3/uL (ref 4.0–10.5)
nRBC: 0 % (ref 0.0–0.2)

## 2023-09-06 LAB — LACTATE DEHYDROGENASE: LDH: 132 U/L (ref 98–192)

## 2023-09-06 MED ORDER — DARATUMUMAB-HYALURONIDASE-FIHJ 1800-30000 MG-UT/15ML ~~LOC~~ SOLN
1800.0000 mg | Freq: Once | SUBCUTANEOUS | Status: AC
  Administered 2023-09-06: 1800 mg via SUBCUTANEOUS
  Filled 2023-09-06: qty 15

## 2023-09-06 MED ORDER — ACETAMINOPHEN 325 MG PO TABS: 650.0000 mg | ORAL_TABLET | Freq: Once | ORAL | Status: DC

## 2023-09-06 MED ORDER — SODIUM CHLORIDE 0.9 % IV SOLN: INTRAVENOUS | Status: DC

## 2023-09-06 MED ORDER — FAMOTIDINE 20 MG PO TABS: 40.0000 mg | ORAL_TABLET | Freq: Once | ORAL | Status: DC

## 2023-09-06 MED ORDER — ZOLEDRONIC ACID 4 MG/100ML IV SOLN
4.0000 mg | Freq: Once | INTRAVENOUS | Status: AC
Start: 2023-09-06 — End: 2023-09-06
  Administered 2023-09-06: 4 mg via INTRAVENOUS
  Filled 2023-09-06: qty 100

## 2023-09-06 MED ORDER — DIPHENHYDRAMINE HCL 25 MG PO CAPS: 50.0000 mg | ORAL_CAPSULE | Freq: Once | ORAL | Status: DC

## 2023-09-06 NOTE — Patient Instructions (Signed)
[facility] - A DEPT OF [facility]  Discharge Instructions: Thank you for choosing [facility] to provide your oncology and hematology care.   If you have a lab appointment with the Cancer Center, please go directly to the Cancer Center and check in at the registration area.  Wear comfortable clothing and clothing appropriate for easy access to any Portacath or PICC line.   We strive to give you quality time with your provider. You may need to reschedule your appointment if you arrive late (15 or more minutes).  Arriving late affects you and other patients whose appointments are after yours.  Also, if you miss three or more appointments without notifying the office, you may be dismissed from the clinic at the provider's discretion.      For prescription refill requests, have your pharmacy contact our office and allow 72 hours for refills to be completed.    Today you received the following chemotherapy and/or immunotherapy agents Darzalex      To help prevent nausea and vomiting after your treatment, we encourage you to take your nausea medication as directed.  BELOW ARE SYMPTOMS THAT SHOULD BE REPORTED IMMEDIATELY: *FEVER GREATER THAN 100.4 F (38 C) OR HIGHER *CHILLS OR SWEATING *NAUSEA AND VOMITING THAT IS NOT CONTROLLED WITH YOUR NAUSEA MEDICATION *UNUSUAL SHORTNESS OF BREATH *UNUSUAL BRUISING OR BLEEDING *URINARY PROBLEMS (pain or burning when urinating, or frequent urination) *BOWEL PROBLEMS (unusual diarrhea, constipation, pain near the anus) TENDERNESS IN MOUTH AND THROAT WITH OR WITHOUT PRESENCE OF ULCERS (sore throat, sores in mouth, or a toothache) UNUSUAL RASH, SWELLING OR PAIN  UNUSUAL VAGINAL DISCHARGE OR ITCHING   Items with * indicate a potential emergency and should be followed up as soon as possible or go to the Emergency Department if any problems should occur.  Please show the CHEMOTHERAPY ALERT CARD or IMMUNOTHERAPY  ALERT CARD at check-in to the Emergency Department and triage nurse. Should you have questions after your visit or need to cancel or reschedule your appointment, please contact  CANCER CENTER - A DEPT OF [facility]  [phone] and follow the prompts.  Office hours are 8:00 a.m. to 4:30 p.m. Monday - Friday. Please note that voicemails left after 4:00 p.m. may not be returned until the following business day.  We are closed weekends and major holidays. You have access to a nurse at all times for urgent questions. Please call the main number to the clinic [phone] and follow the prompts.  For any non-urgent questions, you may also contact your provider using MyChart. We now offer e-Visits for anyone 71 and older to request care online for non-urgent symptoms. For details visit [email or web address].   Also download the MyChart app! Go to the app store, search "MyChart", open the app, select , and log in with your MyChart username and password.  Zoledronic Acid Injection (Cancer) What is this medication? ZOLEDRONIC ACID (ZOE le dron ik AS id) treats high calcium levels in the blood caused by cancer. It may also be used with chemotherapy to treat weakened bones caused by cancer. It works by slowing down the release of calcium from bones. This lowers calcium levels in your blood. It also makes your bones stronger and less likely to break (fracture). It belongs to a group of medications called bisphosphonates. This medicine may be used for other purposes; ask your health care provider or pharmacist if you have questions. COMMON BRAND NAME(S): Zometa,  Zometa Powder What should I tell my care team before I take this medication? They need to know if you have any of these conditions: Dehydration Dental disease Kidney disease Liver disease Low levels of calcium in the blood Lung or breathing disease, such as asthma Receiving steroids, such as dexamethasone  or prednisone An unusual or allergic reaction to zoledronic acid, other medications, foods, dyes, or preservatives Pregnant or trying to get pregnant Breast-feeding How should I use this medication? This medication is injected into a vein. It is given by your care team in a hospital or clinic setting. Talk to your care team about the use of this medication in children. Special care may be needed. Overdosage: If you think you have taken too much of this medicine contact a poison control center or emergency room at once. NOTE: This medicine is only for you. Do not share this medicine with others. What if I miss a dose? Keep appointments for follow-up doses. It is important not to miss your dose. Call your care team if you are unable to keep an appointment. What may interact with this medication? Certain antibiotics given by injection Diuretics, such as bumetanide, furosemide NSAIDs, medications for pain and inflammation, such as ibuprofen or naproxen Teriparatide Thalidomide This list may not describe all possible interactions. Give your health care provider a list of all the medicines, herbs, non-prescription drugs, or dietary supplements you use. Also tell them if you smoke, drink alcohol, or use illegal drugs. Some items may interact with your medicine. What should I watch for while using this medication? Visit your care team for regular checks on your progress. It may be some time before you see the benefit from this medication. Some people who take this medication have severe bone, joint, or muscle pain. This medication may also increase your risk for jaw problems or a broken thigh bone. Tell your care team right away if you have severe pain in your jaw, bones, joints, or muscles. Tell you care team if you have any pain that does not go away or that gets worse. Tell your dentist and dental surgeon that you are taking this medication. You should not have major dental surgery while on this  medication. See your dentist to have a dental exam and fix any dental problems before starting this medication. Take good care of your teeth while on this medication. Make sure you see your dentist for regular follow-up appointments. You should make sure you get enough calcium and vitamin D while you are taking this medication. Discuss the foods you eat and the vitamins you take with your care team. Check with your care team if you have severe diarrhea, nausea, and vomiting, or if you sweat a lot. The loss of too much body fluid may make it dangerous for you to take this medication. You may need bloodwork while taking this medication. Talk to your care team if you wish to become pregnant or think you might be pregnant. This medication can cause serious birth defects. What side effects may I notice from receiving this medication? Side effects that you should report to your care team as soon as possible: Allergic reactions--skin rash, itching, hives, swelling of the face, lips, tongue, or throat Kidney injury--decrease in the amount of urine, swelling of the ankles, hands, or feet Low calcium level--muscle pain or cramps, confusion, tingling, or numbness in the hands or feet Osteonecrosis of the jaw--pain, swelling, or redness in the mouth, numbness of the jaw, poor healing after  dental work, unusual discharge from the mouth, visible bones in the mouth Severe bone, joint, or muscle pain Side effects that usually do not require medical attention (report to your care team if they continue or are bothersome): Constipation Fatigue Fever Loss of appetite Nausea Stomach pain This list may not describe all possible side effects. Call your doctor for medical advice about side effects. You may report side effects to FDA at 1-800-FDA-1088. Where should I keep my medication? This medication is given in a hospital or clinic. It will not be stored at home. NOTE: This sheet is a summary. It may not cover all  possible information. If you have questions about this medicine, talk to your doctor, pharmacist, or health care provider.  2024 Elsevier/Gold Standard (2021-12-10 00:00:00)

## 2023-09-06 NOTE — Progress Notes (Signed)
Hematology and Oncology Follow Up Visit  Bruce Smith [id number] January 30, 1951 72 y.o. 09/06/2023   Principle Diagnosis:  Kappa light chain myeloma-extensive bone disease-pathologic fracture of left scapula and left femur - t(11:14), 13q-, 1p-,1q-  Past Therapy: Status post surgical repair of left femur-07/14/2020 XRT to left shoulder and left hip Xgeva 120 mg subcu every 3 months-next dose in December 2022  Faspro/Velcade/Revlimid/Decadron -- s/p cycle #2 -- start on       08/06/2020  ( Revlimid is 14 on /14 off)    Current Therapy:        ASCT -- [facility] on -01/22/2021 Faspro - q month -- maintenance -- start on 05/13/2021 Zometa 4 mg IV every 3 months-next dose in 12/2023  Radiation therapy to T12, left iliac and left humerus --completed on 10/04/2022 --XRT to right femoral head -start 01/03/2023 XRT to T9 lesion -to be completed on 06/18/2023    Interim History:  Bruce Smith is here today with his sister for follow-up.  He is doing a whole lot better.  More last saw him, he was not doing well at all.  Had a high temperature.  I suspect he may have had pneumonia.  He received IV antibiotics.  He also had IVIG.  He is feeling well.  He went hunting last weekend.  He will go hunting this weekend.  He has had no nausea or vomiting.  He has had no cough or shortness of breath.  He has had a little bit of leg swelling but this is chronic.  He has had no rashes.  He has had no change in bowel or bladder habits.  Overall, I would say his performance status is probably ECOG 1.  .  . Medications:  Allergies as of 09/06/2023   No Known Allergies      Medication List        Accurate as of September 06, 2023 11:28 AM. If you have any questions, ask your nurse or doctor.          STOP taking these medications    clarithromycin 500 MG tablet Commonly known as: BIAXIN Stopped by: Josph Macho   sucralfate 1 g tablet Commonly known as: Carafate Stopped by: Josph Macho        TAKE these medications    ACETAMINOPHEN ER PO Take 1,000 mg by mouth in the morning, at noon, in the evening, and at bedtime.   aspirin EC 81 MG tablet Take 81 mg by mouth at bedtime.   calcium carbonate 1250 (500 Ca) MG tablet Commonly known as: OS-CAL - dosed in mg of elemental calcium Take 1 tablet (500 mg of elemental calcium total) by mouth 2 (two) times daily with a meal.   furosemide 20 MG tablet Commonly known as: LASIX Take 1 tablet by mouth daily for 3 days, then take daily as needed thereafter for lower extremity edema   Ipratropium-Albuterol 20-100 MCG/ACT Aers respimat Commonly known as: COMBIVENT Inhale 1 puff into the lungs every 6 (six) hours as needed for wheezing.   levETIRAcetam 500 MG tablet Commonly known as: KEPPRA TAKE 1 TABLET BY MOUTH 2 TIMES A DAY   loperamide 2 MG capsule Commonly known as: IMODIUM Take 1 capsule (2 mg total) by mouth as needed for diarrhea or loose stools.   MAGNESIUM PO Take 100 mg by mouth daily.   metoprolol tartrate 25 MG tablet Commonly known as: LOPRESSOR TAKE 1 TABLET BY MOUTH 2 TIMES A DAY   nitroGLYCERIN 0.4 MG  SL tablet Commonly known as: NITROSTAT Place 0.4 mg under the tongue every 5 (five) minutes as needed.   ondansetron 8 MG tablet Commonly known as: ZOFRAN TAKE 1 TABLET BY MOUTH EVERY 8 HOURS AS NEEDED NAUSEA AND VOMITING   pantoprazole 40 MG tablet Commonly known as: PROTONIX TAKE 1 TABLET BY MOUTH 2 TIMES A DAY BEFORE MEALS FOR ACID REFLUX   polyethylene glycol powder 17 GM/SCOOP powder Commonly known as: GLYCOLAX/MIRALAX Mix 17g (1 capful) with 8 oz of favorite beverage and drink every morning   pregabalin 200 MG capsule Commonly known as: LYRICA TAKE 1 CAPSULE BY MOUTH 2 TIMES DAILY   prochlorperazine 10 MG tablet Commonly known as: COMPAZINE Take 1 tablet (10 mg total) by mouth every 6 (six) hours as needed for nausea or vomiting.   rosuvastatin 40 MG tablet Commonly known as:  CRESTOR Take 40 mg by mouth daily.   saccharomyces boulardii 250 MG capsule Commonly known as: FLORASTOR Take 250 mg by mouth daily.   tamsulosin 0.4 MG Caps capsule Commonly known as: FLOMAX TAKE 1 CAPSULE BY MOUTH EVERY NIGHT AT BEDTIME   traMADol 50 MG tablet Commonly known as: ULTRAM TAKE 2 TABLETS BY MOUTH EVERY 6 HOURS ASNEEDED FOR PAIN        Allergies: No Known Allergies  Past Medical History, Surgical history, Social history, and Family History were reviewed and updated.  Review of Systems: Review of Systems  Constitutional: Negative.   HENT: Negative.    Eyes: Negative.   Respiratory: Negative.    Cardiovascular: Negative.   Gastrointestinal: Negative.   Genitourinary: Negative.   Musculoskeletal:  Positive for joint pain.  Skin: Negative.   Neurological: Negative.   Endo/Heme/Allergies: Negative.   Psychiatric/Behavioral: Negative.       Physical Exam:  height is 5\' 8"  (1.727 m) and weight is 207 lb 1.9 oz (93.9 kg). His oral temperature is 97.9 F (36.6 C). His blood pressure is 130/54 (abnormal) and his pulse is 57 (abnormal). His respiration is 20 and oxygen saturation is 97%.   Wt Readings from Last 3 Encounters:  09/06/23 207 lb 1.9 oz (93.9 kg)  08/02/23 203 lb 14.8 oz (92.5 kg)  07/20/23 204 lb 0.4 oz (92.5 kg)    Physical Exam Vitals reviewed.  Constitutional:      Comments: This is a well-developed and well-nourished white male in no obvious distress.  He has a nicely healed median sternotomy scar.  HENT:     Head: Normocephalic and atraumatic.  Eyes:     Pupils: Pupils are equal, round, and reactive to light.  Cardiovascular:     Rate and Rhythm: Normal rate and regular rhythm.     Heart sounds: Normal heart sounds.  Pulmonary:     Effort: Pulmonary effort is normal.     Breath sounds: Normal breath sounds.  Abdominal:     General: Bowel sounds are normal.     Palpations: Abdomen is soft.  Musculoskeletal:        General: No  tenderness or deformity. Normal range of motion.     Cervical back: Normal range of motion.     Comments: There is pain to palpation in the left hip area.  This is in the lateral hip area.  He has some decreased range of motion.  Lymphadenopathy:     Cervical: No cervical adenopathy.  Skin:    General: Skin is warm and dry.     Findings: No erythema or rash.  Neurological:  Mental Status: He is alert and oriented to person, place, and time.  Psychiatric:        Behavior: Behavior normal.        Thought Content: Thought content normal.        Judgment: Judgment normal.      Lab Results  Component Value Date   WBC 4.0 09/06/2023   HGB 11.7 (L) 09/06/2023   HCT 36.6 (L) 09/06/2023   MCV 94.1 09/06/2023   PLT 95 (L) 09/06/2023   Lab Results  Component Value Date   FERRITIN 938 (H) 02/23/2021   IRON 67 02/23/2021   TIBC 156 (L) 02/23/2021   UIBC 90 (L) 02/23/2021   IRONPCTSAT 43 02/23/2021   Lab Results  Component Value Date   RBC 3.89 (L) 09/06/2023   Lab Results  Component Value Date   KPAFRELGTCHN 10.4 07/12/2023   LAMBDASER 4.1 (L) 07/12/2023   KAPLAMBRATIO 2.54 (H) 07/12/2023   Lab Results  Component Value Date   IGGSERUM 337 (L) 07/12/2023   IGA 15 (L) 07/12/2023   IGMSERUM 6 (L) 07/12/2023   Lab Results  Component Value Date   TOTALPROTELP 5.5 (L) 07/12/2023   ALBUMINELP 3.3 07/12/2023   A1GS 0.3 07/12/2023   A2GS 0.8 07/12/2023   BETS 0.8 07/12/2023   GAMS 0.3 (L) 07/12/2023   MSPIKE Not Observed 07/12/2023     Chemistry      Component Value Date/Time   NA 142 09/06/2023 0942   K 4.1 09/06/2023 0942   CL 107 09/06/2023 0942   CO2 28 09/06/2023 0942   BUN 17 09/06/2023 0942   CREATININE 1.15 09/06/2023 0942      Component Value Date/Time   CALCIUM 9.0 09/06/2023 0942   ALKPHOS 81 09/06/2023 0942   AST 15 09/06/2023 0942   ALT 16 09/06/2023 0942   BILITOT 0.3 09/06/2023 0942       Impression and Plan: Bruce Smith is a very pleasant 72  yo gentleman with 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   I am just happy that he got better from this pneumonia.  Will see what his IgG level is.  We did give him some IVIG when he was last here.  It is possible we may have to do this more often.  He will get his Zometa today.  He is probably can be due for another PET scan.  He had radiation therapy to a T9 lesion back in August.  We will see if there is any changes in the PET scan.  I will have him come back to see me in 5 weeks.   Josph Macho, MD 11/6/202411:28 AM

## 2023-09-07 ENCOUNTER — Encounter: Payer: Self-pay | Admitting: Hematology & Oncology

## 2023-09-07 ENCOUNTER — Other Ambulatory Visit: Payer: Self-pay

## 2023-09-07 DIAGNOSIS — D801 Nonfamilial hypogammaglobulinemia: Secondary | ICD-10-CM | POA: Insufficient documentation

## 2023-09-07 LAB — IGG, IGA, IGM
IgA: 12 mg/dL — ABNORMAL LOW (ref 61–437)
IgG (Immunoglobin G), Serum: 501 mg/dL — ABNORMAL LOW (ref 603–1613)
IgM (Immunoglobulin M), Srm: 6 mg/dL — ABNORMAL LOW (ref 15–143)

## 2023-09-07 LAB — PROTEIN ELECTROPHORESIS, SERUM, WITH REFLEX
A/G Ratio: 1.5 (ref 0.7–1.7)
Albumin ELP: 3.1 g/dL (ref 2.9–4.4)
Alpha-1-Globulin: 0.2 g/dL (ref 0.0–0.4)
Alpha-2-Globulin: 0.8 g/dL (ref 0.4–1.0)
Beta Globulin: 0.7 g/dL (ref 0.7–1.3)
Gamma Globulin: 0.4 g/dL (ref 0.4–1.8)
Globulin, Total: 2.1 g/dL — ABNORMAL LOW (ref 2.2–3.9)
Total Protein ELP: 5.2 g/dL — ABNORMAL LOW (ref 6.0–8.5)

## 2023-09-07 LAB — KAPPA/LAMBDA LIGHT CHAINS
Kappa free light chain: 12.5 mg/L (ref 3.3–19.4)
Kappa, lambda light chain ratio: 2.23 — ABNORMAL HIGH (ref 0.26–1.65)
Lambda free light chains: 5.6 mg/L — ABNORMAL LOW (ref 5.7–26.3)

## 2023-09-07 NOTE — Addendum Note (Signed)
Addended by: Arlan Organ R on: 09/07/2023 09:53 AM   Modules accepted: Orders

## 2023-09-15 ENCOUNTER — Inpatient Hospital Stay: Payer: Medicare Other

## 2023-09-15 DIAGNOSIS — Z5112 Encounter for antineoplastic immunotherapy: Secondary | ICD-10-CM | POA: Diagnosis not present

## 2023-09-15 DIAGNOSIS — C9000 Multiple myeloma not having achieved remission: Secondary | ICD-10-CM

## 2023-09-19 ENCOUNTER — Other Ambulatory Visit: Payer: Self-pay

## 2023-09-19 LAB — UPEP/UIFE/LIGHT CHAINS/TP, 24-HR UR
% BETA, Urine: 21.9 %
ALPHA 1 URINE: 6.6 %
Albumin, U: 49.9 %
Alpha 2, Urine: 15.4 %
Free Kappa Lt Chains,Ur: 78.31 mg/L (ref 1.17–86.46)
Free Kappa/Lambda Ratio: 5.51 (ref 1.83–14.26)
Free Lambda Lt Chains,Ur: 14.21 mg/L (ref 0.27–15.21)
GAMMA GLOBULIN URINE: 6.2 %
M-SPIKE %, Urine: 2.4 % — ABNORMAL HIGH
M-Spike, Mg/24 Hr: 23 mg/(24.h) — ABNORMAL HIGH
Total Protein, Urine-Ur/day: 950 mg/(24.h) — ABNORMAL HIGH (ref 30–150)
Total Protein, Urine: 63.3 mg/dL
Total Volume: 1500

## 2023-09-21 ENCOUNTER — Telehealth: Payer: Self-pay | Admitting: *Deleted

## 2023-09-21 NOTE — Telephone Encounter (Signed)
Patient wife Bruce Smith called worried about the urine protein results that populated in MyChart.  Showed these results to Dr Myna Hidalgo.  He will contact Gwynn Long at [facility] to discuss.  Kim notified

## 2023-09-26 ENCOUNTER — Encounter (HOSPITAL_COMMUNITY)
Admission: RE | Admit: 2023-09-26 | Discharge: 2023-09-26 | Disposition: A | Discharge status: Home / Self Care | Payer: Medicare Other | Source: Ambulatory Visit | Attending: Hematology & Oncology | Admitting: Hematology & Oncology

## 2023-09-26 DIAGNOSIS — C9000 Multiple myeloma not having achieved remission: Secondary | ICD-10-CM | POA: Diagnosis present

## 2023-09-26 LAB — GLUCOSE, CAPILLARY: Glucose-Capillary: 93 mg/dL (ref 70–99)

## 2023-09-26 MED ORDER — FLUDEOXYGLUCOSE F - 18 (FDG) INJECTION
10.0000 | Freq: Once | INTRAVENOUS | Status: AC
  Administered 2023-09-26: 10.31 via INTRAVENOUS

## 2023-09-29 ENCOUNTER — Other Ambulatory Visit: Payer: Self-pay

## 2023-10-02 ENCOUNTER — Other Ambulatory Visit: Payer: Self-pay | Admitting: Hematology & Oncology

## 2023-10-04 ENCOUNTER — Inpatient Hospital Stay: Payer: Medicare Other | Admitting: Hematology & Oncology

## 2023-10-04 ENCOUNTER — Inpatient Hospital Stay: Payer: Medicare Other

## 2023-10-10 ENCOUNTER — Inpatient Hospital Stay (HOSPITAL_BASED_OUTPATIENT_CLINIC_OR_DEPARTMENT_OTHER): Payer: Medicare Other | Admitting: Hematology & Oncology

## 2023-10-10 ENCOUNTER — Inpatient Hospital Stay: Payer: Medicare Other | Attending: Hematology & Oncology

## 2023-10-10 ENCOUNTER — Inpatient Hospital Stay: Payer: Medicare Other

## 2023-10-10 ENCOUNTER — Encounter: Payer: Self-pay | Admitting: Hematology & Oncology

## 2023-10-10 ENCOUNTER — Other Ambulatory Visit: Payer: Self-pay

## 2023-10-10 VITALS — BP 113/48 | HR 59 | Temp 98.2°F | Resp 18 | Ht 68.0 in | Wt 208.0 lb

## 2023-10-10 DIAGNOSIS — Z5111 Encounter for antineoplastic chemotherapy: Secondary | ICD-10-CM | POA: Diagnosis present

## 2023-10-10 DIAGNOSIS — C9000 Multiple myeloma not having achieved remission: Secondary | ICD-10-CM | POA: Diagnosis not present

## 2023-10-10 DIAGNOSIS — Z9484 Stem cells transplant status: Secondary | ICD-10-CM | POA: Diagnosis not present

## 2023-10-10 LAB — CMP (CANCER CENTER ONLY)
ALT: 20 U/L (ref 0–44)
AST: 20 U/L (ref 15–41)
Albumin: 3.9 g/dL (ref 3.5–5.0)
Alkaline Phosphatase: 77 U/L (ref 38–126)
Anion gap: 9 (ref 5–15)
BUN: 19 mg/dL (ref 8–23)
CO2: 24 mmol/L (ref 22–32)
Calcium: 9.2 mg/dL (ref 8.9–10.3)
Chloride: 108 mmol/L (ref 98–111)
Creatinine: 1.36 mg/dL — ABNORMAL HIGH (ref 0.61–1.24)
GFR, Estimated: 55 mL/min — ABNORMAL LOW (ref >60)
Glucose, Bld: 85 mg/dL (ref 70–99)
Potassium: 4.1 mmol/L (ref 3.5–5.1)
Sodium: 141 mmol/L (ref 135–145)
Total Bilirubin: 0.4 mg/dL (ref <1.2)
Total Protein: 5.9 g/dL — ABNORMAL LOW (ref 6.5–8.1)

## 2023-10-10 LAB — CBC WITH DIFFERENTIAL (CANCER CENTER ONLY)
Abs Immature Granulocytes: 0.02 10*3/uL (ref 0.00–0.07)
Basophils Absolute: 0 10*3/uL (ref 0.0–0.1)
Basophils Relative: 0 %
Eosinophils Absolute: 0.1 10*3/uL (ref 0.0–0.5)
Eosinophils Relative: 2 %
HCT: 37.7 % — ABNORMAL LOW (ref 39.0–52.0)
Hemoglobin: 12.3 g/dL — ABNORMAL LOW (ref 13.0–17.0)
Immature Granulocytes: 0 %
Lymphocytes Relative: 10 %
Lymphs Abs: 0.6 10*3/uL — ABNORMAL LOW (ref 0.7–4.0)
MCH: 30.6 pg (ref 26.0–34.0)
MCHC: 32.6 g/dL (ref 30.0–36.0)
MCV: 93.8 fL (ref 80.0–100.0)
Monocytes Absolute: 1 10*3/uL (ref 0.1–1.0)
Monocytes Relative: 15 %
Neutro Abs: 4.6 10*3/uL (ref 1.7–7.7)
Neutrophils Relative %: 73 %
Platelet Count: 107 10*3/uL — ABNORMAL LOW (ref 150–400)
RBC: 4.02 MIL/uL — ABNORMAL LOW (ref 4.22–5.81)
RDW: 14.3 % (ref 11.5–15.5)
WBC Count: 6.3 10*3/uL (ref 4.0–10.5)
nRBC: 0 % (ref 0.0–0.2)

## 2023-10-10 LAB — LACTATE DEHYDROGENASE: LDH: 141 U/L (ref 98–192)

## 2023-10-11 ENCOUNTER — Other Ambulatory Visit: Payer: Self-pay

## 2023-10-11 ENCOUNTER — Encounter: Payer: Self-pay | Admitting: Hematology & Oncology

## 2023-10-11 ENCOUNTER — Other Ambulatory Visit (HOSPITAL_COMMUNITY): Payer: Self-pay

## 2023-10-11 DIAGNOSIS — C9000 Multiple myeloma not having achieved remission: Secondary | ICD-10-CM

## 2023-10-11 LAB — IGG, IGA, IGM
IgA: 10 mg/dL — ABNORMAL LOW (ref 61–437)
IgG (Immunoglobin G), Serum: 397 mg/dL — ABNORMAL LOW (ref 603–1613)
IgM (Immunoglobulin M), Srm: <5 mg/dL — ABNORMAL LOW (ref 15–143)

## 2023-10-11 LAB — KAPPA/LAMBDA LIGHT CHAINS
Kappa free light chain: 12.8 mg/L (ref 3.3–19.4)
Kappa, lambda light chain ratio: 2.29 — ABNORMAL HIGH (ref 0.26–1.65)
Lambda free light chains: 5.6 mg/L — ABNORMAL LOW (ref 5.7–26.3)

## 2023-10-11 MED ORDER — ONDANSETRON HCL 8 MG PO TABS: 8.0000 mg | ORAL_TABLET | Freq: Three times a day (TID) | ORAL | 1 refills | Status: DC | PRN

## 2023-10-11 MED ORDER — POMALIDOMIDE 3 MG PO CAPS: 3.0000 mg | ORAL_CAPSULE | Freq: Every day | ORAL | 6 refills | Status: DC

## 2023-10-11 MED ORDER — DEXAMETHASONE 4 MG PO TABS: ORAL_TABLET | ORAL | 11 refills | Status: DC

## 2023-10-11 MED ORDER — POMALIDOMIDE 3 MG PO CAPS
3.0000 mg | ORAL_CAPSULE | Freq: Every day | ORAL | 6 refills | Status: DC
  Filled 2023-10-11: qty 21, 21d supply, fill #0

## 2023-10-11 MED ORDER — PROCHLORPERAZINE MALEATE 10 MG PO TABS: 10.0000 mg | ORAL_TABLET | Freq: Four times a day (QID) | ORAL | 1 refills | Status: AC | PRN

## 2023-10-11 NOTE — Addendum Note (Signed)
Addended by: Arlan Organ R on: 10/11/2023 04:04 PM   Modules accepted: Orders

## 2023-10-11 NOTE — Progress Notes (Signed)
Hematology and Oncology Follow Up Visit  Bruce Smith [id number] 1951/06/20 72 y.o. 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 - t(11:14), 13q-, 1p-,1q-  Past Therapy: Status post surgical repair of left femur-07/14/2020 XRT to left shoulder and left hip Xgeva 120 mg subcu every 3 months-next dose in December 2022  Faspro/Velcade/Revlimid/Decadron -- s/p cycle #2 -- start on       08/06/2020  ( Revlimid is 14 on /14 off)    Current Therapy:        ASCT -- [facility] on -01/22/2021 Faspro - q month -- maintenance -- start on 05/13/2021 -- d/c on 10/10/2023 Zometa 4 mg IV every 3 months-next dose in 12/2023  Radiation therapy to T12, left iliac and left humerus --completed on 10/04/2022 --XRT to right femoral head -start 01/03/2023 XRT to T9 lesion -to be completed on 06/18/2023 Kyprolis/Pomalyst -- start on 10/19/2023    Interim History:  Bruce Smith is here today with his wife for follow-up.  It looks like we clearly have progressive disease now.  He had a PET scan that was done.  This was done on 09/26/2023.  This showed that he has multiple new areas of disease.  He has a new lesion at C6.  There is C5 vertebral body and proximal left femur.  He has a new activity in the right paraspinal region at the level of the right kidney.  Also noted is new left hilar lymph node.  He did have a 24-hour urine that was done.  This did show new Kappa light chain.  His free kappa light chain was 78.3 mg/L.  Of note, his last serum kappa light chain was 1.3 mg/dL.  This has been trending up slowly.  I have spoken with Dr. Jacqulyn Bath of [facility].  Bruce Smith would be a candidate for CAR-T therapy.  He also a candidate for one of the new bi-specific agents.  We are going to have to make a change in his protocol.  I probably would consider him for Kyprolis/pomalidomide.  I think this would be a reasonable option for him.  We are  going to need to have a echocardiogram on him.  Hopefully, we will be able to get a good response so that he will then go to immunotherapy.  He feels okay.  His wife, however, is having some issues.  She is going need to have skull base surgery in early January.  She has been having these spasms on the right side of her face.  Bruce Smith wants to make sure that he is able to be with her for her surgery.  I told him that we can adjust our protocol to make sure that he is going to be with her.  He has had no cough.  He has had no shortness of breath.  There is been no nausea or vomiting.  He has had no change in bowel or bladder habits.  He has had no rashes.  He has had no bleeding.  Overall, I would have to say that his performance status is probably ECOG 1.   Medications:  Allergies as of 10/10/2023   No Known Allergies      Medication List        Accurate as of October 10, 2023 11:59 PM. If you have any questions, ask your nurse or doctor.          STOP taking these medications  MAGNESIUM PO Stopped by: Josph Macho       TAKE these medications    ACETAMINOPHEN ER PO Take 1,000 mg by mouth in the morning, at noon, in the evening, and at bedtime.   aspirin EC 81 MG tablet Take 81 mg by mouth at bedtime.   calcium carbonate 1250 (500 Ca) MG tablet Commonly known as: OS-CAL - dosed in mg of elemental calcium Take 1 tablet (500 mg of elemental calcium total) by mouth 2 (two) times daily with a meal.   celecoxib 100 MG capsule Commonly known as: CELEBREX Take 100 mg by mouth 2 (two) times daily.   furosemide 20 MG tablet Commonly known as: LASIX Take 1 tablet by mouth daily for 3 days, then take daily as needed thereafter for lower extremity edema   Ipratropium-Albuterol 20-100 MCG/ACT Aers respimat Commonly known as: COMBIVENT Inhale 1 puff into the lungs every 6 (six) hours as needed for wheezing.   levETIRAcetam 500 MG tablet Commonly known as:  KEPPRA TAKE 1 TABLET BY MOUTH 2 TIMES A DAY   loperamide 2 MG capsule Commonly known as: IMODIUM Take 1 capsule (2 mg total) by mouth as needed for diarrhea or loose stools.   Magnesium Oxide 140 MG Caps Take 250 mg by mouth daily.   metoprolol tartrate 25 MG tablet Commonly known as: LOPRESSOR TAKE 1 TABLET BY MOUTH 2 TIMES A DAY   nitroGLYCERIN 0.4 MG SL tablet Commonly known as: NITROSTAT Place 0.4 mg under the tongue every 5 (five) minutes as needed.   ondansetron 8 MG tablet Commonly known as: ZOFRAN TAKE 1 TABLET BY MOUTH EVERY 8 HOURS AS NEEDED NAUSEA AND VOMITING   pantoprazole 40 MG tablet Commonly known as: PROTONIX TAKE 1 TABLET BY MOUTH 2 TIMES A DAY BEFORE MEALS FOR ACID REFLUX   polyethylene glycol powder 17 GM/SCOOP powder Commonly known as: GLYCOLAX/MIRALAX Mix 17g (1 capful) with 8 oz of favorite beverage and drink every morning   pregabalin 200 MG capsule Commonly known as: LYRICA TAKE 1 CAPSULE BY MOUTH 2 TIMES DAILY   prochlorperazine 10 MG tablet Commonly known as: COMPAZINE Take 1 tablet (10 mg total) by mouth every 6 (six) hours as needed for nausea or vomiting.   rosuvastatin 40 MG tablet Commonly known as: CRESTOR Take 40 mg by mouth daily.   saccharomyces boulardii 250 MG capsule Commonly known as: FLORASTOR Take 250 mg by mouth daily.   tamsulosin 0.4 MG Caps capsule Commonly known as: FLOMAX TAKE 1 CAPSULE BY MOUTH EVERY NIGHT AT BEDTIME   traMADol 50 MG tablet Commonly known as: ULTRAM TAKE 2 TABLETS BY MOUTH EVERY 6 HOURS ASNEEDED FOR PAIN        Allergies: No Known Allergies  Past Medical History, Surgical history, Social history, and Family History were reviewed and updated.  Review of Systems: Review of Systems  Constitutional: Negative.   HENT: Negative.    Eyes: Negative.   Respiratory: Negative.    Cardiovascular: Negative.   Gastrointestinal: Negative.   Genitourinary: Negative.   Musculoskeletal:  Positive  for joint pain.  Skin: Negative.   Neurological: Negative.   Endo/Heme/Allergies: Negative.   Psychiatric/Behavioral: Negative.       Physical Exam:  height is 5\' 8"  (1.727 m) and weight is 208 lb (94.3 kg). His oral temperature is 98.2 F (36.8 C). His blood pressure is 113/48 (abnormal) and his pulse is 59 (abnormal). His respiration is 18 and oxygen saturation is 97%.   Wt Readings from Last  3 Encounters:  10/10/23 208 lb (94.3 kg)  09/06/23 207 lb 1.9 oz (93.9 kg)  08/02/23 203 lb 14.8 oz (92.5 kg)    Physical Exam Vitals reviewed.  Constitutional:      Comments: This is a well-developed and well-nourished white male in no obvious distress.  He has a nicely healed median sternotomy scar.  HENT:     Head: Normocephalic and atraumatic.  Eyes:     Pupils: Pupils are equal, round, and reactive to light.  Cardiovascular:     Rate and Rhythm: Normal rate and regular rhythm.     Heart sounds: Normal heart sounds.  Pulmonary:     Effort: Pulmonary effort is normal.     Breath sounds: Normal breath sounds.  Abdominal:     General: Bowel sounds are normal.     Palpations: Abdomen is soft.  Musculoskeletal:        General: No tenderness or deformity. Normal range of motion.     Cervical back: Normal range of motion.     Comments: There is pain to palpation in the left hip area.  This is in the lateral hip area.  He has some decreased range of motion.  Lymphadenopathy:     Cervical: No cervical adenopathy.  Skin:    General: Skin is warm and dry.     Findings: No erythema or rash.  Neurological:     Mental Status: He is alert and oriented to person, place, and time.  Psychiatric:        Behavior: Behavior normal.        Thought Content: Thought content normal.        Judgment: Judgment normal.      Lab Results  Component Value Date   WBC 6.3 10/10/2023   HGB 12.3 (L) 10/10/2023   HCT 37.7 (L) 10/10/2023   MCV 93.8 10/10/2023   PLT 107 (L) 10/10/2023   Lab Results   Component Value Date   FERRITIN 938 (H) 02/23/2021   IRON 67 02/23/2021   TIBC 156 (L) 02/23/2021   UIBC 90 (L) 02/23/2021   IRONPCTSAT 43 02/23/2021   Lab Results  Component Value Date   RBC 4.02 (L) 10/10/2023   Lab Results  Component Value Date   KPAFRELGTCHN 12.5 09/06/2023   LAMBDASER 5.6 (L) 09/06/2023   KAPLAMBRATIO 5.51 09/15/2023   Lab Results  Component Value Date   IGGSERUM 397 (L) 10/10/2023   IGA 10 (L) 10/10/2023   IGMSERUM <5 (L) 10/10/2023   Lab Results  Component Value Date   TOTALPROTELP 5.2 (L) 09/06/2023   ALBUMINELP 3.1 09/06/2023   A1GS 0.2 09/06/2023   A2GS 0.8 09/06/2023   BETS 0.7 09/06/2023   GAMS 0.4 09/06/2023   MSPIKE Not Observed 09/06/2023     Chemistry      Component Value Date/Time   NA 141 10/10/2023 1029   K 4.1 10/10/2023 1029   CL 108 10/10/2023 1029   CO2 24 10/10/2023 1029   BUN 19 10/10/2023 1029   CREATININE 1.36 (H) 10/10/2023 1029      Component Value Date/Time   CALCIUM 9.2 10/10/2023 1029   ALKPHOS 77 10/10/2023 1029   AST 20 10/10/2023 1029   ALT 20 10/10/2023 1029   BILITOT 0.4 10/10/2023 1029       Impression and Plan: Mr. Dearborn is a very pleasant 72 yo gentleman with 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   I think that we  clearly have recurrence now.  Again, we are going to have to make a change in his protocol.  Hopefully, he will be able to have immunotherapy at [facility] in a month or so.  We will try to get his disease under better control prior to having his immunotherapy with either CAR-T therapy or bi-specific therapy.  We will try to get treatment started next week.  I will have to see about doing an echocardiogram on him.  I long talk with his wife about this.  I tried to reassure him that he was still in good shape and that we certainly had a lot of options for him.   Josph Macho, MD 12/11/20247:07 AM

## 2023-10-11 NOTE — Progress Notes (Signed)
Pomalyst -[employer] auth discussed with patient and signed. New rx sent to [facility].

## 2023-10-12 ENCOUNTER — Telehealth: Payer: Self-pay

## 2023-10-12 ENCOUNTER — Other Ambulatory Visit (HOSPITAL_COMMUNITY): Payer: Self-pay

## 2023-10-12 ENCOUNTER — Other Ambulatory Visit: Payer: Self-pay

## 2023-10-12 ENCOUNTER — Encounter: Payer: Self-pay | Admitting: Hematology & Oncology

## 2023-10-12 ENCOUNTER — Telehealth: Payer: Self-pay | Admitting: Pharmacist

## 2023-10-12 DIAGNOSIS — C9000 Multiple myeloma not having achieved remission: Secondary | ICD-10-CM

## 2023-10-12 MED ORDER — POMALIDOMIDE 3 MG PO CAPS: 3.0000 mg | ORAL_CAPSULE | Freq: Every day | ORAL | 0 refills | Status: DC

## 2023-10-12 NOTE — Telephone Encounter (Addendum)
Oral Oncology Pharmacist Encounter  Received new prescription for Pomalyst (pomalidomide) for the treatment of R/R multiple myeloma in conjunction with carfilzomib and dexamethasone, planned duration until disease progression or unacceptable drug toxicity.  CBC w/ Diff and CMP from 10/10/23 assessed, noted patient with Scr of 1.36 mg/dL (CrCl ~66.0 mL/min). Patient with platelet count fo 107 K/uL. Prescription dose and frequency assessed for appropriateness. Dosing per MD discretion.  Current medication list in Epic reviewed, DDIs with Pomalyst identified: Category D drug-drug interaction between Pomalyst and Tramadol - pomalyst may increase CNS depressant effects of tramadol. Recommend monitor patient for increased s/sx of fatigue/somnolence. No changes in therapy warranted at this time.   Evaluated chart and no patient barriers to medication adherence noted.   Prescription for Pomalyst has been redirected to [facility] for dispensing as medication is limited distribution.  Oral Oncology Clinic will continue to follow for insurance authorization, copayment issues, initial counseling and start date.  Sherry Ruffing, PharmD, BCPS, BCOP Hematology/Oncology Clinical Pharmacist [facility] Oral Chemotherapy Navigation Clinics [phone] 10/12/2023 8:58 AM

## 2023-10-12 NOTE — Telephone Encounter (Signed)
Oral Oncology Patient Advocate Encounter  Was successful in securing patient a $12,000.00 grant from [facility] to provide copayment coverage for Pomalyst.  This will keep the out of pocket expense at $0.     [employer] ID: [id number]   The billing information is as follows and has been shared with [facility].    RxBin: [id number] PCN: [id number] Member ID: [id number] Group ID: [id number] Dates of Eligibility: 09/12/23 through 09/10/24  Fund:  Multiple Myeloma - Medicare Access   Ardeen Fillers, CPhT Oncology Pharmacy Patient Advocate  [facility]  [phone] (phone) [phone] (fax) 10/12/2023 10:16 AM

## 2023-10-12 NOTE — Progress Notes (Signed)
Pharmacist Chemotherapy Monitoring - Initial Assessment    Anticipated start date: 10/19/23   The following has been reviewed per standard work regarding the patient's treatment regimen: The patient's diagnosis, treatment plan and drug doses, and organ/hematologic function Lab orders and baseline tests specific to treatment regimen  The treatment plan start date, drug sequencing, and pre-medications Prior authorization status  Patient's documented medication list, including drug-drug interaction screen and prescriptions for anti-emetics and supportive care specific to the treatment regimen The drug concentrations, fluid compatibility, administration routes, and timing of the medications to be used The patient's access for treatment and lifetime cumulative dose history, if applicable  The patient's medication allergies and previous infusion related reactions, if applicable   Changes made to treatment plan:  N/A  Follow up needed:  Rx for VZV prophylaxis needed   Ebony Hail, Pharm.D., CPP 10/12/2023@3 :55 PM

## 2023-10-12 NOTE — Telephone Encounter (Signed)
Oral Oncology Patient Advocate Encounter  New authorization   Received notification that prior authorization for Pomalyst is required.   PA submitted on 10/12/23  Key [id number]  Status is pending     Ardeen Fillers, CPhT Oncology Pharmacy Patient Advocate  [facility]  [phone] (phone) [phone] (fax) 10/12/2023 9:18 AM

## 2023-10-12 NOTE — Telephone Encounter (Addendum)
Oral Oncology Patient Advocate Encounter  Prior Authorization for Pomalyst has been approved.    PA# [id number]  Effective dates: 10/12/23 through 10/30/24  Patients co-pay is $2,848.91.   Obtained [employer] grant to make co-pay $0.00.  Patient must fill through [facility]. Script and all supporting documents sent to [facility] for processing and fulfillment.    Ardeen Fillers, CPhT Oncology Pharmacy Patient Advocate  [facility]  [phone] (phone) [phone] (fax) 10/12/2023 9:38 AM

## 2023-10-13 LAB — PROTEIN ELECTROPHORESIS, SERUM, WITH REFLEX
A/G Ratio: 1.4 (ref 0.7–1.7)
Albumin ELP: 3.3 g/dL (ref 2.9–4.4)
Alpha-1-Globulin: 0.3 g/dL (ref 0.0–0.4)
Alpha-2-Globulin: 0.8 g/dL (ref 0.4–1.0)
Beta Globulin: 0.8 g/dL (ref 0.7–1.3)
Gamma Globulin: 0.4 g/dL (ref 0.4–1.8)
Globulin, Total: 2.3 g/dL (ref 2.2–3.9)
Total Protein ELP: 5.6 g/dL — ABNORMAL LOW (ref 6.0–8.5)

## 2023-10-17 ENCOUNTER — Telehealth: Payer: Self-pay

## 2023-10-17 ENCOUNTER — Other Ambulatory Visit: Payer: Self-pay | Admitting: *Deleted

## 2023-10-17 DIAGNOSIS — M79671 Pain in right foot: Secondary | ICD-10-CM

## 2023-10-17 DIAGNOSIS — C419 Malignant neoplasm of bone and articular cartilage, unspecified: Secondary | ICD-10-CM

## 2023-10-17 DIAGNOSIS — C9000 Multiple myeloma not having achieved remission: Secondary | ICD-10-CM

## 2023-10-17 DIAGNOSIS — R52 Pain, unspecified: Secondary | ICD-10-CM

## 2023-10-17 MED ORDER — NALOXONE HCL 4 MG/0.1ML NA LIQD: NASAL | 0 refills | Status: AC

## 2023-10-17 NOTE — Telephone Encounter (Signed)
Oral Chemotherapy Pharmacist Encounter  I spoke with patient and patient's wife for overview of: Pomalyst (pomalidomide) for the treatment of relapsed multiple myeloma  in conjunction with carfilzomib and dexamethasone, planned duration until disease progression or unacceptable drug toxicity.   Counseled patient on administration, dosing, side effects, monitoring, drug-food interactions, safe handling, storage, and disposal.  Patient will take Pomalyst 3mg  capsules, 1 capsule by mouth once daily, without regard to food, with a full glass of water.  Pomalyst will be given 21 days on, 7 days off, repeat every 28 days.  Patient will take dexamethasone 4mg  tablets, 5 tablets (20mg ) by mouth once weekly with breakfast. Patient knows that for the first cycle, he will take dexamethasone at home on 11/09/23.  Pomalyst start date: 10/19/23 PM  Adverse effects of Pomalyst include but are not limited to: nausea, GI upset, rash, fatigue, peripheral edema, and decreased blood counts.   VTE PPX: Patient confirms that he takes aspirin 81 mg daily and will continue this while on Pomalyst.  Reviewed with patient importance of keeping a medication schedule and plan for any missed doses. No barriers to medication adherence identified.  Medication reconciliation performed and medication/allergy list updated.   Insurance authorization for [employer] has been obtained.  Pomalyst prescription is being dispensed from [facility] as it is a limited distribution medication.  All questions answered.  Mr. And Ms. Pellow voiced understanding and appreciation.   Medication education handout placed in mail for patient. Patient knows to call the office with questions or concerns. Oral Chemotherapy Clinic phone number provided to patient.   Sherry Ruffing, PharmD, BCPS, BCOP Hematology/Oncology Clinical Pharmacist [facility] Oral Chemotherapy Navigation Clinics [phone] 10/17/2023  9:54 AM

## 2023-10-17 NOTE — Telephone Encounter (Signed)
Late Entry:  Received phone call 10/16/2023 at 1615 from [facility] that patient was receiving counsel on new prescription Pomalyst.  Per pharmacy representative pt is taking tramadol four times daily.  Pt is to have Narcan on hand as pomalyst and tramadol have a medication interaction of CNS depression. Per pharmacy representative pt has been educated and has narcan at home and aware of how to use it.  Dr. Myna Hidalgo notified. No new orders received.

## 2023-10-17 NOTE — Telephone Encounter (Signed)
Received notification that patient has scheduled delivery of Pomalyst from [facility] for Wednesday, 10/18/23.    Ardeen Fillers, CPhT Oncology Pharmacy Patient Advocate  [facility]  [phone] (phone) [phone] (fax) 10/17/2023 9:40 AM

## 2023-10-19 ENCOUNTER — Telehealth: Payer: Self-pay | Admitting: *Deleted

## 2023-10-19 ENCOUNTER — Inpatient Hospital Stay: Payer: Medicare Other

## 2023-10-19 VITALS — BP 136/56 | HR 54 | Temp 97.7°F | Resp 19

## 2023-10-19 DIAGNOSIS — C9000 Multiple myeloma not having achieved remission: Secondary | ICD-10-CM

## 2023-10-19 DIAGNOSIS — Z5111 Encounter for antineoplastic chemotherapy: Secondary | ICD-10-CM | POA: Diagnosis not present

## 2023-10-19 LAB — CMP (CANCER CENTER ONLY)
ALT: 24 U/L (ref 0–44)
AST: 21 U/L (ref 15–41)
Albumin: 4.1 g/dL (ref 3.5–5.0)
Alkaline Phosphatase: 83 U/L (ref 38–126)
Anion gap: 6 (ref 5–15)
BUN: 23 mg/dL (ref 8–23)
CO2: 28 mmol/L (ref 22–32)
Calcium: 9.2 mg/dL (ref 8.9–10.3)
Chloride: 106 mmol/L (ref 98–111)
Creatinine: 1.29 mg/dL — ABNORMAL HIGH (ref 0.61–1.24)
GFR, Estimated: 59 mL/min — ABNORMAL LOW (ref >60)
Glucose, Bld: 99 mg/dL (ref 70–99)
Potassium: 4.7 mmol/L (ref 3.5–5.1)
Sodium: 140 mmol/L (ref 135–145)
Total Bilirubin: 0.4 mg/dL (ref <1.2)
Total Protein: 5.8 g/dL — ABNORMAL LOW (ref 6.5–8.1)

## 2023-10-19 LAB — CBC WITH DIFFERENTIAL (CANCER CENTER ONLY)
Abs Immature Granulocytes: 0.02 10*3/uL (ref 0.00–0.07)
Basophils Absolute: 0 10*3/uL (ref 0.0–0.1)
Basophils Relative: 0 %
Eosinophils Absolute: 0.1 10*3/uL (ref 0.0–0.5)
Eosinophils Relative: 2 %
HCT: 36.9 % — ABNORMAL LOW (ref 39.0–52.0)
Hemoglobin: 12.1 g/dL — ABNORMAL LOW (ref 13.0–17.0)
Immature Granulocytes: 0 %
Lymphocytes Relative: 12 %
Lymphs Abs: 0.6 10*3/uL — ABNORMAL LOW (ref 0.7–4.0)
MCH: 30.6 pg (ref 26.0–34.0)
MCHC: 32.8 g/dL (ref 30.0–36.0)
MCV: 93.2 fL (ref 80.0–100.0)
Monocytes Absolute: 1 10*3/uL (ref 0.1–1.0)
Monocytes Relative: 18 %
Neutro Abs: 3.6 10*3/uL (ref 1.7–7.7)
Neutrophils Relative %: 68 %
Platelet Count: 101 10*3/uL — ABNORMAL LOW (ref 150–400)
RBC: 3.96 MIL/uL — ABNORMAL LOW (ref 4.22–5.81)
RDW: 13.8 % (ref 11.5–15.5)
WBC Count: 5.4 10*3/uL (ref 4.0–10.5)
nRBC: 0 % (ref 0.0–0.2)

## 2023-10-19 MED ORDER — FAMCICLOVIR 250 MG PO TABS: 250.0000 mg | ORAL_TABLET | Freq: Every day | ORAL | 3 refills | Status: DC

## 2023-10-19 MED ORDER — SODIUM CHLORIDE 0.9 % IV SOLN: INTRAVENOUS | Status: DC

## 2023-10-19 MED ORDER — DEXTROSE 5 % IV SOLN
20.0000 mg/m2 | Freq: Once | INTRAVENOUS | Status: AC
  Administered 2023-10-19: 40 mg via INTRAVENOUS
  Filled 2023-10-19: qty 15

## 2023-10-19 MED ORDER — SODIUM CHLORIDE 0.9 % IV SOLN
20.0000 mg | Freq: Once | INTRAVENOUS | Status: AC
  Administered 2023-10-19: 20 mg via INTRAVENOUS
  Filled 2023-10-19: qty 20

## 2023-10-19 MED ORDER — SODIUM CHLORIDE 0.9 % IV SOLN: Freq: Once | INTRAVENOUS | Status: AC

## 2023-10-19 NOTE — Progress Notes (Signed)
MD gave ok to treat with labs from 12/10. Pt here for 1st treatment of Kyprolis.

## 2023-10-19 NOTE — Telephone Encounter (Signed)
Rx for Famvir sent to pt pharmacy

## 2023-10-19 NOTE — Patient Instructions (Signed)
Carfilzomib Injection What is this medication? CARFILZOMIB (kar FILZ oh mib) treats multiple myeloma, a type of bone marrow cancer. It works by blocking a protein that causes cancer cells to grow and multiply. This helps to slow or stop the spread of cancer cells. This medicine may be used for other purposes; ask your health care provider or pharmacist if you have questions. COMMON BRAND NAME(S): KYPROLIS What should I tell my care team before I take this medication? They need to know if you have any of these conditions: Heart disease History of blood clots Irregular heartbeat Kidney disease Liver disease Lung or breathing disease An unusual or allergic reaction to carfilzomib, or other medications, foods, dyes, or preservatives If you or your partner are pregnant or trying to get pregnant Breastfeeding How should I use this medication? This medication is injected into a vein. It is given by your care team in a hospital or clinic setting. Talk to your care team about the use of this medication in children. Special care may be needed. Overdosage: If you think you have taken too much of this medicine contact a poison control center or emergency room at once. NOTE: This medicine is only for you. Do not share this medicine with others. What if I miss a dose? Keep appointments for follow-up doses. It is important not to miss your dose. Call your care team if you are unable to keep an appointment. What may interact with this medication? Interactions are not expected. This list may not describe all possible interactions. Give your health care provider a list of all the medicines, herbs, non-prescription drugs, or dietary supplements you use. Also tell them if you smoke, drink alcohol, or use illegal drugs. Some items may interact with your medicine. What should I watch for while using this medication? Your condition will be monitored carefully while you are receiving this medication. You may need  blood work while taking this medication. Check with your care team if you have severe diarrhea, nausea, and vomiting, or if you sweat a lot. The loss of too much body fluid may make it dangerous for you to take this medication. This medication may affect your coordination, reaction time, or judgment. Do not drive or operate machinery until you know how this medication affects you. Sit up or stand slowly to reduce the risk of dizzy or fainting spells. Drinking alcohol with this medication can increase the risk of these side effects. Talk to your care team if you may be pregnant. Serious birth defects can occur if you take this medication during pregnancy and for 6 months after the last dose. You will need a negative pregnancy test before starting this medication. Contraception is recommended while taking this medication and for 6 months after the last dose. Your care team can help you find an option that works for you. If your partner can get pregnant, use a condom during sex while taking this medication and for 3 months after the last dose. Do not breastfeed while taking this medication and for 2 weeks after the last dose. This medication may cause infertility. Talk to your care team if you are concerned about your fertility. What side effects may I notice from receiving this medication? Side effects that you should report to your care team as soon as possible: Allergic reactions--skin rash, itching, hives, swelling of the face, lips, tongue, or throat Bleeding--bloody or black, tar-like stools, vomiting blood or brown material that looks like coffee grounds, red or dark brown urine,  small red or purple spots on skin, unusual bruising or bleeding Blood clot--pain, swelling, or warmth in the leg, shortness of breath, chest pain Dizziness, loss of balance or coordination, confusion or trouble speaking Heart attack--pain or tightness in the chest, shoulders, arms, or jaw, nausea, shortness of breath, cold  or clammy skin, feeling faint or lightheaded Heart failure--shortness of breath, swelling of the ankles, feet, or hands, sudden weight gain, unusual weakness or fatigue Heart rhythm changes--fast or irregular heartbeat, dizziness, feeling faint or lightheaded, chest pain, trouble breathing Increase in blood pressure Infection--fever, chills, cough, sore throat, wounds that don't heal, pain or trouble when passing urine, general feeling of discomfort or being unwell Infusion reactions--chest pain, shortness of breath or trouble breathing, feeling faint or lightheaded Kidney injury--decrease in the amount of urine, swelling of the ankles, hands, or feet Liver injury--right upper belly pain, loss of appetite, nausea, light-colored stool, dark yellow or brown urine, yellowing skin or eyes, unusual weakness or fatigue Lung injury--shortness of breath or trouble breathing, cough, spitting up blood, chest pain, fever Pulmonary hypertension--shortness of breath, chest pain, fast or irregular heartbeat, feeling faint or lightheaded, fatigue, swelling of the ankles or feet Stomach pain, bloody diarrhea, pale skin, unusual weakness or fatigue, decrease in the amount of urine, which may be signs of hemolytic uremic syndrome Sudden and severe headache, confusion, change in vision, seizures, which may be signs of posterior reversible encephalopathy syndrome (PRES) TTP--purple spots on the skin or inside the mouth, pale skin, yellowing skin or eyes, unusual weakness or fatigue, fever, fast or irregular heartbeat, confusion, change in vision, trouble speaking, trouble walking Tumor lysis syndrome (TLS)--nausea, vomiting, diarrhea, decrease in the amount of urine, dark urine, unusual weakness or fatigue, confusion, muscle pain or cramps, fast or irregular heartbeat, joint pain Side effects that usually do not require medical attention (report to your care team if they continue or are  bothersome): Diarrhea Fatigue Nausea Trouble sleeping This list may not describe all possible side effects. Call your doctor for medical advice about side effects. You may report side effects to FDA at 1-800-FDA-1088. Where should I keep my medication? This medication is given in a hospital or clinic. It will not be stored at home. NOTE: This sheet is a summary. It may not cover all possible information. If you have questions about this medicine, talk to your doctor, pharmacist, or health care provider.  2024 [employer]vier/Gold Standard (2022-03-17 00:00:00)

## 2023-10-23 ENCOUNTER — Other Ambulatory Visit: Payer: Self-pay | Admitting: Hematology & Oncology

## 2023-10-23 ENCOUNTER — Other Ambulatory Visit: Payer: Self-pay | Admitting: Interventional Cardiology

## 2023-10-23 DIAGNOSIS — E440 Moderate protein-calorie malnutrition: Secondary | ICD-10-CM

## 2023-10-23 DIAGNOSIS — C9000 Multiple myeloma not having achieved remission: Secondary | ICD-10-CM

## 2023-10-26 ENCOUNTER — Inpatient Hospital Stay: Payer: Medicare Other

## 2023-10-26 VITALS — BP 142/57 | HR 50 | Temp 97.7°F | Resp 19

## 2023-10-26 DIAGNOSIS — C9000 Multiple myeloma not having achieved remission: Secondary | ICD-10-CM

## 2023-10-26 DIAGNOSIS — Z5111 Encounter for antineoplastic chemotherapy: Secondary | ICD-10-CM | POA: Diagnosis not present

## 2023-10-26 LAB — CBC WITH DIFFERENTIAL (CANCER CENTER ONLY)
Abs Immature Granulocytes: 0.03 10*3/uL (ref 0.00–0.07)
Basophils Absolute: 0 10*3/uL (ref 0.0–0.1)
Basophils Relative: 0 %
Eosinophils Absolute: 0.2 10*3/uL (ref 0.0–0.5)
Eosinophils Relative: 4 %
HCT: 36.3 % — ABNORMAL LOW (ref 39.0–52.0)
Hemoglobin: 12 g/dL — ABNORMAL LOW (ref 13.0–17.0)
Immature Granulocytes: 1 %
Lymphocytes Relative: 7 %
Lymphs Abs: 0.3 10*3/uL — ABNORMAL LOW (ref 0.7–4.0)
MCH: 30.8 pg (ref 26.0–34.0)
MCHC: 33.1 g/dL (ref 30.0–36.0)
MCV: 93.3 fL (ref 80.0–100.0)
Monocytes Absolute: 0.7 10*3/uL (ref 0.1–1.0)
Monocytes Relative: 17 %
Neutro Abs: 3 10*3/uL (ref 1.7–7.7)
Neutrophils Relative %: 71 %
Platelet Count: 72 10*3/uL — ABNORMAL LOW (ref 150–400)
RBC: 3.89 MIL/uL — ABNORMAL LOW (ref 4.22–5.81)
RDW: 14 % (ref 11.5–15.5)
WBC Count: 4.2 10*3/uL (ref 4.0–10.5)
nRBC: 0 % (ref 0.0–0.2)

## 2023-10-26 LAB — CMP (CANCER CENTER ONLY)
ALT: 24 U/L (ref 0–44)
AST: 14 U/L — ABNORMAL LOW (ref 15–41)
Albumin: 3.9 g/dL (ref 3.5–5.0)
Alkaline Phosphatase: 98 U/L (ref 38–126)
Anion gap: 8 (ref 5–15)
BUN: 20 mg/dL (ref 8–23)
CO2: 25 mmol/L (ref 22–32)
Calcium: 9.4 mg/dL (ref 8.9–10.3)
Chloride: 108 mmol/L (ref 98–111)
Creatinine: 1.31 mg/dL — ABNORMAL HIGH (ref 0.61–1.24)
GFR, Estimated: 58 mL/min — ABNORMAL LOW (ref >60)
Glucose, Bld: 98 mg/dL (ref 70–99)
Potassium: 4.2 mmol/L (ref 3.5–5.1)
Sodium: 141 mmol/L (ref 135–145)
Total Bilirubin: 0.4 mg/dL (ref <1.2)
Total Protein: 5.6 g/dL — ABNORMAL LOW (ref 6.5–8.1)

## 2023-10-26 MED ORDER — DEXTROSE 5 % IV SOLN
27.0000 mg/m2 | Freq: Once | INTRAVENOUS | Status: AC
  Administered 2023-10-26: 60 mg via INTRAVENOUS
  Filled 2023-10-26: qty 30

## 2023-10-26 MED ORDER — SODIUM CHLORIDE 0.9% FLUSH
10.0000 mL | INTRAVENOUS | Status: DC | PRN
Start: 2023-10-26 — End: 2023-10-26

## 2023-10-26 MED ORDER — SODIUM CHLORIDE 0.9 % IV SOLN
20.0000 mg | Freq: Once | INTRAVENOUS | Status: AC
  Administered 2023-10-26: 20 mg via INTRAVENOUS
  Filled 2023-10-26: qty 2

## 2023-10-26 MED ORDER — SODIUM CHLORIDE 0.9 % IV SOLN: INTRAVENOUS | Status: DC

## 2023-10-26 MED ORDER — SODIUM CHLORIDE 0.9 % IV SOLN: Freq: Once | INTRAVENOUS | Status: AC

## 2023-10-26 MED ORDER — HEPARIN SOD (PORK) LOCK FLUSH 100 UNIT/ML IV SOLN: 500.0000 [IU] | Freq: Once | INTRAVENOUS | Status: DC | PRN

## 2023-10-26 NOTE — Patient Instructions (Signed)
Carfilzomib Injection What is this medication? CARFILZOMIB (kar FILZ oh mib) treats multiple myeloma, a type of bone marrow cancer. It works by blocking a protein that causes cancer cells to grow and multiply. This helps to slow or stop the spread of cancer cells. This medicine may be used for other purposes; ask your health care provider or pharmacist if you have questions. COMMON BRAND NAME(S): KYPROLIS What should I tell my care team before I take this medication? They need to know if you have any of these conditions: Heart disease History of blood clots Irregular heartbeat Kidney disease Liver disease Lung or breathing disease An unusual or allergic reaction to carfilzomib, or other medications, foods, dyes, or preservatives If you or your partner are pregnant or trying to get pregnant Breastfeeding How should I use this medication? This medication is injected into a vein. It is given by your care team in a hospital or clinic setting. Talk to your care team about the use of this medication in children. Special care may be needed. Overdosage: If you think you have taken too much of this medicine contact a poison control center or emergency room at once. NOTE: This medicine is only for you. Do not share this medicine with others. What if I miss a dose? Keep appointments for follow-up doses. It is important not to miss your dose. Call your care team if you are unable to keep an appointment. What may interact with this medication? Interactions are not expected. This list may not describe all possible interactions. Give your health care provider a list of all the medicines, herbs, non-prescription drugs, or dietary supplements you use. Also tell them if you smoke, drink alcohol, or use illegal drugs. Some items may interact with your medicine. What should I watch for while using this medication? Your condition will be monitored carefully while you are receiving this medication. You may need  blood work while taking this medication. Check with your care team if you have severe diarrhea, nausea, and vomiting, or if you sweat a lot. The loss of too much body fluid may make it dangerous for you to take this medication. This medication may affect your coordination, reaction time, or judgment. Do not drive or operate machinery until you know how this medication affects you. Sit up or stand slowly to reduce the risk of dizzy or fainting spells. Drinking alcohol with this medication can increase the risk of these side effects. Talk to your care team if you may be pregnant. Serious birth defects can occur if you take this medication during pregnancy and for 6 months after the last dose. You will need a negative pregnancy test before starting this medication. Contraception is recommended while taking this medication and for 6 months after the last dose. Your care team can help you find an option that works for you. If your partner can get pregnant, use a condom during sex while taking this medication and for 3 months after the last dose. Do not breastfeed while taking this medication and for 2 weeks after the last dose. This medication may cause infertility. Talk to your care team if you are concerned about your fertility. What side effects may I notice from receiving this medication? Side effects that you should report to your care team as soon as possible: Allergic reactions--skin rash, itching, hives, swelling of the face, lips, tongue, or throat Bleeding--bloody or black, tar-like stools, vomiting blood or brown material that looks like coffee grounds, red or dark brown urine,  small red or purple spots on skin, unusual bruising or bleeding Blood clot--pain, swelling, or warmth in the leg, shortness of breath, chest pain Dizziness, loss of balance or coordination, confusion or trouble speaking Heart attack--pain or tightness in the chest, shoulders, arms, or jaw, nausea, shortness of breath, cold  or clammy skin, feeling faint or lightheaded Heart failure--shortness of breath, swelling of the ankles, feet, or hands, sudden weight gain, unusual weakness or fatigue Heart rhythm changes--fast or irregular heartbeat, dizziness, feeling faint or lightheaded, chest pain, trouble breathing Increase in blood pressure Infection--fever, chills, cough, sore throat, wounds that don't heal, pain or trouble when passing urine, general feeling of discomfort or being unwell Infusion reactions--chest pain, shortness of breath or trouble breathing, feeling faint or lightheaded Kidney injury--decrease in the amount of urine, swelling of the ankles, hands, or feet Liver injury--right upper belly pain, loss of appetite, nausea, light-colored stool, dark yellow or brown urine, yellowing skin or eyes, unusual weakness or fatigue Lung injury--shortness of breath or trouble breathing, cough, spitting up blood, chest pain, fever Pulmonary hypertension--shortness of breath, chest pain, fast or irregular heartbeat, feeling faint or lightheaded, fatigue, swelling of the ankles or feet Stomach pain, bloody diarrhea, pale skin, unusual weakness or fatigue, decrease in the amount of urine, which may be signs of hemolytic uremic syndrome Sudden and severe headache, confusion, change in vision, seizures, which may be signs of posterior reversible encephalopathy syndrome (PRES) TTP--purple spots on the skin or inside the mouth, pale skin, yellowing skin or eyes, unusual weakness or fatigue, fever, fast or irregular heartbeat, confusion, change in vision, trouble speaking, trouble walking Tumor lysis syndrome (TLS)--nausea, vomiting, diarrhea, decrease in the amount of urine, dark urine, unusual weakness or fatigue, confusion, muscle pain or cramps, fast or irregular heartbeat, joint pain Side effects that usually do not require medical attention (report to your care team if they continue or are  bothersome): Diarrhea Fatigue Nausea Trouble sleeping This list may not describe all possible side effects. Call your doctor for medical advice about side effects. You may report side effects to FDA at 1-800-FDA-1088. Where should I keep my medication? This medication is given in a hospital or clinic. It will not be stored at home. NOTE: This sheet is a summary. It may not cover all possible information. If you have questions about this medicine, talk to your doctor, pharmacist, or health care provider.  2024 [employer]vier/Gold Standard (2022-03-17 00:00:00)

## 2023-10-26 NOTE — Progress Notes (Signed)
Okay to treat with pltc 72 per Dr. Myna Hidalgo.

## 2023-10-26 NOTE — Progress Notes (Signed)
Md reviewed labs " ok to treat despite counts"

## 2023-11-02 ENCOUNTER — Inpatient Hospital Stay: Payer: Medicare Other | Attending: Hematology & Oncology

## 2023-11-02 ENCOUNTER — Inpatient Hospital Stay: Payer: Medicare Other

## 2023-11-02 VITALS — BP 133/53 | HR 51 | Temp 97.9°F | Resp 18

## 2023-11-02 DIAGNOSIS — Z5111 Encounter for antineoplastic chemotherapy: Secondary | ICD-10-CM | POA: Diagnosis present

## 2023-11-02 DIAGNOSIS — Z9484 Stem cells transplant status: Secondary | ICD-10-CM | POA: Insufficient documentation

## 2023-11-02 DIAGNOSIS — C9000 Multiple myeloma not having achieved remission: Secondary | ICD-10-CM | POA: Diagnosis not present

## 2023-11-02 DIAGNOSIS — C419 Malignant neoplasm of bone and articular cartilage, unspecified: Secondary | ICD-10-CM

## 2023-11-02 LAB — CBC WITH DIFFERENTIAL (CANCER CENTER ONLY)
Abs Immature Granulocytes: 0.02 10*3/uL (ref 0.00–0.07)
Basophils Absolute: 0 10*3/uL (ref 0.0–0.1)
Basophils Relative: 0 %
Eosinophils Absolute: 0.2 10*3/uL (ref 0.0–0.5)
Eosinophils Relative: 3 %
HCT: 35.8 % — ABNORMAL LOW (ref 39.0–52.0)
Hemoglobin: 11.7 g/dL — ABNORMAL LOW (ref 13.0–17.0)
Immature Granulocytes: 0 %
Lymphocytes Relative: 7 %
Lymphs Abs: 0.3 10*3/uL — ABNORMAL LOW (ref 0.7–4.0)
MCH: 30.6 pg (ref 26.0–34.0)
MCHC: 32.7 g/dL (ref 30.0–36.0)
MCV: 93.7 fL (ref 80.0–100.0)
Monocytes Absolute: 0.9 10*3/uL (ref 0.1–1.0)
Monocytes Relative: 19 %
Neutro Abs: 3.3 10*3/uL (ref 1.7–7.7)
Neutrophils Relative %: 71 %
Platelet Count: 41 10*3/uL — ABNORMAL LOW (ref 150–400)
RBC: 3.82 MIL/uL — ABNORMAL LOW (ref 4.22–5.81)
RDW: 14 % (ref 11.5–15.5)
WBC Count: 4.8 10*3/uL (ref 4.0–10.5)
nRBC: 0 % (ref 0.0–0.2)

## 2023-11-02 LAB — CMP (CANCER CENTER ONLY)
ALT: 34 U/L (ref 0–44)
AST: 18 U/L (ref 15–41)
Albumin: 3.8 g/dL (ref 3.5–5.0)
Alkaline Phosphatase: 97 U/L (ref 38–126)
Anion gap: 8 (ref 5–15)
BUN: 24 mg/dL — ABNORMAL HIGH (ref 8–23)
CO2: 25 mmol/L (ref 22–32)
Calcium: 8.9 mg/dL (ref 8.9–10.3)
Chloride: 107 mmol/L (ref 98–111)
Creatinine: 1.22 mg/dL (ref 0.61–1.24)
GFR, Estimated: >60 mL/min (ref >60)
Glucose, Bld: 105 mg/dL — ABNORMAL HIGH (ref 70–99)
Potassium: 4.4 mmol/L (ref 3.5–5.1)
Sodium: 140 mmol/L (ref 135–145)
Total Bilirubin: 0.4 mg/dL (ref 0.0–1.2)
Total Protein: 5.7 g/dL — ABNORMAL LOW (ref 6.5–8.1)

## 2023-11-02 MED ORDER — DEXTROSE 5 % IV SOLN: INTRAVENOUS | Status: DC

## 2023-11-02 MED ORDER — ACETAMINOPHEN 325 MG PO TABS
650.0000 mg | ORAL_TABLET | Freq: Once | ORAL | Status: AC
  Administered 2023-11-02: 650 mg via ORAL
  Filled 2023-11-02: qty 2

## 2023-11-02 MED ORDER — IMMUNE GLOBULIN (HUMAN) 40 GM/400ML IV SOLN
40.0000 g | Freq: Once | INTRAVENOUS | Status: AC
  Administered 2023-11-02: 40 g via INTRAVENOUS
  Filled 2023-11-02: qty 400

## 2023-11-02 MED ORDER — DIPHENHYDRAMINE HCL 25 MG PO CAPS
25.0000 mg | ORAL_CAPSULE | Freq: Once | ORAL | Status: AC
Start: 2023-11-02 — End: 2023-11-02
  Administered 2023-11-02: 25 mg via ORAL
  Filled 2023-11-02: qty 1

## 2023-11-02 NOTE — Progress Notes (Signed)
 Will not get Kyprolis today per Dr. Myna Hidalgo d/t his low platelets count. He will only get Privigen.

## 2023-11-02 NOTE — Patient Instructions (Signed)

## 2023-11-06 ENCOUNTER — Other Ambulatory Visit: Payer: Self-pay

## 2023-11-06 DIAGNOSIS — C9000 Multiple myeloma not having achieved remission: Secondary | ICD-10-CM

## 2023-11-06 MED ORDER — TRAMADOL HCL 50 MG PO TABS: ORAL_TABLET | ORAL | 0 refills | Status: DC

## 2023-11-06 NOTE — Telephone Encounter (Signed)
 Received phone call from patient wife stating that she had questions in regards to his medications. Pt wife asking if pt needs his platelets checked this week since his kyprolis  was held last week. Pt wife educated that since he was already taking his polmalyst to continue to take that as prescribed and pt does not need his platelets checked. Pt wife asking if he would continue to take polmalyst after his 7 day break. Pt wife educated that she can discuss that with Dr. Timmy on his next appt on 11/16/2023 which is his day to restart medication. Pt wife also requesting refill on pt tramadol  medication. Refill sent to preferred pharmacy. Pt wife had no further questions and verbalized understanding.

## 2023-11-06 NOTE — Progress Notes (Signed)
 error

## 2023-11-06 NOTE — Telephone Encounter (Signed)
 Error

## 2023-11-07 ENCOUNTER — Other Ambulatory Visit: Payer: Self-pay

## 2023-11-07 DIAGNOSIS — C9000 Multiple myeloma not having achieved remission: Secondary | ICD-10-CM

## 2023-11-07 MED ORDER — POMALIDOMIDE 3 MG PO CAPS: 3.0000 mg | ORAL_CAPSULE | Freq: Every day | ORAL | 0 refills | Status: DC

## 2023-11-12 ENCOUNTER — Encounter (HOSPITAL_BASED_OUTPATIENT_CLINIC_OR_DEPARTMENT_OTHER): Payer: Self-pay | Admitting: Emergency Medicine

## 2023-11-12 ENCOUNTER — Inpatient Hospital Stay (HOSPITAL_BASED_OUTPATIENT_CLINIC_OR_DEPARTMENT_OTHER)
Admission: EM | Admit: 2023-11-12 | Discharge: 2023-11-16 | DRG: 871 | Disposition: A | Discharge status: Home / Self Care | Payer: Medicare Other | Attending: Internal Medicine | Admitting: Internal Medicine

## 2023-11-12 ENCOUNTER — Emergency Department (HOSPITAL_BASED_OUTPATIENT_CLINIC_OR_DEPARTMENT_OTHER): Payer: Medicare Other

## 2023-11-12 ENCOUNTER — Other Ambulatory Visit: Payer: Self-pay

## 2023-11-12 DIAGNOSIS — R0902 Hypoxemia: Secondary | ICD-10-CM | POA: Diagnosis present

## 2023-11-12 DIAGNOSIS — Z6831 Body mass index (BMI) 31.0-31.9, adult: Secondary | ICD-10-CM | POA: Diagnosis not present

## 2023-11-12 DIAGNOSIS — I10 Essential (primary) hypertension: Secondary | ICD-10-CM | POA: Diagnosis present

## 2023-11-12 DIAGNOSIS — E66811 Obesity, class 1: Secondary | ICD-10-CM | POA: Diagnosis present

## 2023-11-12 DIAGNOSIS — Z79899 Other long term (current) drug therapy: Secondary | ICD-10-CM

## 2023-11-12 DIAGNOSIS — R7989 Other specified abnormal findings of blood chemistry: Secondary | ICD-10-CM | POA: Diagnosis not present

## 2023-11-12 DIAGNOSIS — D849 Immunodeficiency, unspecified: Secondary | ICD-10-CM | POA: Diagnosis present

## 2023-11-12 DIAGNOSIS — N19 Unspecified kidney failure: Secondary | ICD-10-CM

## 2023-11-12 DIAGNOSIS — M549 Dorsalgia, unspecified: Secondary | ICD-10-CM | POA: Diagnosis present

## 2023-11-12 DIAGNOSIS — Z8579 Personal history of other malignant neoplasms of lymphoid, hematopoietic and related tissues: Secondary | ICD-10-CM | POA: Diagnosis not present

## 2023-11-12 DIAGNOSIS — E860 Dehydration: Secondary | ICD-10-CM | POA: Diagnosis present

## 2023-11-12 DIAGNOSIS — D61818 Other pancytopenia: Secondary | ICD-10-CM

## 2023-11-12 DIAGNOSIS — Z9221 Personal history of antineoplastic chemotherapy: Secondary | ICD-10-CM

## 2023-11-12 DIAGNOSIS — Z87891 Personal history of nicotine dependence: Secondary | ICD-10-CM

## 2023-11-12 DIAGNOSIS — A419 Sepsis, unspecified organism: Secondary | ICD-10-CM | POA: Diagnosis not present

## 2023-11-12 DIAGNOSIS — E785 Hyperlipidemia, unspecified: Secondary | ICD-10-CM | POA: Diagnosis present

## 2023-11-12 DIAGNOSIS — E782 Mixed hyperlipidemia: Secondary | ICD-10-CM

## 2023-11-12 DIAGNOSIS — Z823 Family history of stroke: Secondary | ICD-10-CM

## 2023-11-12 DIAGNOSIS — Z9481 Bone marrow transplant status: Secondary | ICD-10-CM | POA: Diagnosis not present

## 2023-11-12 DIAGNOSIS — Z1152 Encounter for screening for COVID-19: Secondary | ICD-10-CM | POA: Diagnosis not present

## 2023-11-12 DIAGNOSIS — I2510 Atherosclerotic heart disease of native coronary artery without angina pectoris: Secondary | ICD-10-CM | POA: Diagnosis present

## 2023-11-12 DIAGNOSIS — Z951 Presence of aortocoronary bypass graft: Secondary | ICD-10-CM

## 2023-11-12 DIAGNOSIS — I700 Atherosclerosis of aorta: Secondary | ICD-10-CM | POA: Diagnosis present

## 2023-11-12 DIAGNOSIS — K219 Gastro-esophageal reflux disease without esophagitis: Secondary | ICD-10-CM

## 2023-11-12 DIAGNOSIS — G8929 Other chronic pain: Secondary | ICD-10-CM | POA: Diagnosis present

## 2023-11-12 DIAGNOSIS — D709 Neutropenia, unspecified: Secondary | ICD-10-CM | POA: Diagnosis present

## 2023-11-12 DIAGNOSIS — N179 Acute kidney failure, unspecified: Secondary | ICD-10-CM

## 2023-11-12 DIAGNOSIS — E8809 Other disorders of plasma-protein metabolism, not elsewhere classified: Secondary | ICD-10-CM | POA: Diagnosis present

## 2023-11-12 DIAGNOSIS — E872 Acidosis, unspecified: Secondary | ICD-10-CM | POA: Diagnosis present

## 2023-11-12 DIAGNOSIS — E611 Iron deficiency: Secondary | ICD-10-CM | POA: Diagnosis present

## 2023-11-12 DIAGNOSIS — J189 Pneumonia, unspecified organism: Secondary | ICD-10-CM | POA: Diagnosis present

## 2023-11-12 DIAGNOSIS — J310 Chronic rhinitis: Secondary | ICD-10-CM | POA: Diagnosis present

## 2023-11-12 DIAGNOSIS — Z981 Arthrodesis status: Secondary | ICD-10-CM

## 2023-11-12 DIAGNOSIS — Z923 Personal history of irradiation: Secondary | ICD-10-CM

## 2023-11-12 DIAGNOSIS — R531 Weakness: Secondary | ICD-10-CM | POA: Diagnosis not present

## 2023-11-12 DIAGNOSIS — C9002 Multiple myeloma in relapse: Secondary | ICD-10-CM | POA: Diagnosis not present

## 2023-11-12 DIAGNOSIS — I252 Old myocardial infarction: Secondary | ICD-10-CM

## 2023-11-12 DIAGNOSIS — Z8042 Family history of malignant neoplasm of prostate: Secondary | ICD-10-CM

## 2023-11-12 DIAGNOSIS — R5081 Fever presenting with conditions classified elsewhere: Secondary | ICD-10-CM | POA: Diagnosis present

## 2023-11-12 DIAGNOSIS — C9000 Multiple myeloma not having achieved remission: Secondary | ICD-10-CM | POA: Diagnosis present

## 2023-11-12 DIAGNOSIS — Z7982 Long term (current) use of aspirin: Secondary | ICD-10-CM

## 2023-11-12 DIAGNOSIS — R0602 Shortness of breath: Secondary | ICD-10-CM | POA: Diagnosis present

## 2023-11-12 LAB — DIFFERENTIAL
Abs Immature Granulocytes: 0 10*3/uL (ref 0.00–0.07)
Band Neutrophils: 11 %
Basophils Absolute: 0 10*3/uL (ref 0.0–0.1)
Basophils Relative: 0 %
Eosinophils Absolute: 0 10*3/uL (ref 0.0–0.5)
Eosinophils Relative: 1 %
Lymphocytes Relative: 55 %
Lymphs Abs: 0.7 10*3/uL (ref 0.7–4.0)
Monocytes Absolute: 0.2 10*3/uL (ref 0.1–1.0)
Monocytes Relative: 14 %
Neutro Abs: 0.4 10*3/uL — CL (ref 1.7–7.7)
Neutrophils Relative %: 19 %
Smear Review: DECREASED
WBC Morphology: >10

## 2023-11-12 LAB — COMPREHENSIVE METABOLIC PANEL
ALT: 46 U/L — ABNORMAL HIGH (ref 0–44)
AST: 27 U/L (ref 15–41)
Albumin: 3.4 g/dL — ABNORMAL LOW (ref 3.5–5.0)
Alkaline Phosphatase: 80 U/L (ref 38–126)
Anion gap: 8 (ref 5–15)
BUN: 27 mg/dL — ABNORMAL HIGH (ref 8–23)
CO2: 21 mmol/L — ABNORMAL LOW (ref 22–32)
Calcium: 8.5 mg/dL — ABNORMAL LOW (ref 8.9–10.3)
Chloride: 107 mmol/L (ref 98–111)
Creatinine, Ser: 1.12 mg/dL (ref 0.61–1.24)
GFR, Estimated: >60 mL/min (ref >60)
Glucose, Bld: 109 mg/dL — ABNORMAL HIGH (ref 70–99)
Potassium: 3.5 mmol/L (ref 3.5–5.1)
Sodium: 136 mmol/L (ref 135–145)
Total Bilirubin: 0.6 mg/dL (ref 0.0–1.2)
Total Protein: 6.2 g/dL — ABNORMAL LOW (ref 6.5–8.1)

## 2023-11-12 LAB — CBC WITH DIFFERENTIAL/PLATELET

## 2023-11-12 LAB — RESP PANEL BY RT-PCR (RSV, FLU A&B, COVID)  RVPGX2
Influenza A by PCR: NEGATIVE
Influenza B by PCR: NEGATIVE
Resp Syncytial Virus by PCR: NEGATIVE
SARS Coronavirus 2 by RT PCR: NEGATIVE

## 2023-11-12 LAB — URINALYSIS, ROUTINE W REFLEX MICROSCOPIC
Bilirubin Urine: NEGATIVE
Glucose, UA: NEGATIVE mg/dL
Hgb urine dipstick: NEGATIVE
Ketones, ur: NEGATIVE mg/dL
Leukocytes,Ua: NEGATIVE
Nitrite: NEGATIVE
Protein, ur: 30 mg/dL — AB
Specific Gravity, Urine: 1.025 (ref 1.005–1.030)
pH: 5.5 (ref 5.0–8.0)

## 2023-11-12 LAB — CBC
HCT: 35.4 % — ABNORMAL LOW (ref 39.0–52.0)
Hemoglobin: 11.5 g/dL — ABNORMAL LOW (ref 13.0–17.0)
MCH: 30.3 pg (ref 26.0–34.0)
MCHC: 32.5 g/dL (ref 30.0–36.0)
MCV: 93.2 fL (ref 80.0–100.0)
Platelets: 63 10*3/uL — ABNORMAL LOW (ref 150–400)
RBC: 3.8 MIL/uL — ABNORMAL LOW (ref 4.22–5.81)
RDW: 14.6 % (ref 11.5–15.5)
WBC: 1.2 10*3/uL — CL (ref 4.0–10.5)
nRBC: 0 % (ref 0.0–0.2)

## 2023-11-12 LAB — URINALYSIS, MICROSCOPIC (REFLEX)

## 2023-11-12 LAB — BRAIN NATRIURETIC PEPTIDE: B Natriuretic Peptide: 619.9 pg/mL — ABNORMAL HIGH (ref 0.0–100.0)

## 2023-11-12 LAB — TROPONIN I (HIGH SENSITIVITY)
Troponin I (High Sensitivity): 19 ng/L — ABNORMAL HIGH (ref <18)
Troponin I (High Sensitivity): 19 ng/L — ABNORMAL HIGH (ref <18)

## 2023-11-12 LAB — LACTIC ACID, PLASMA: Lactic Acid, Venous: 1.9 mmol/L (ref 0.5–1.9)

## 2023-11-12 MED ORDER — MELATONIN 3 MG PO TABS
3.0000 mg | ORAL_TABLET | Freq: Every evening | ORAL | Status: DC | PRN
  Filled 2023-11-12: qty 1

## 2023-11-12 MED ORDER — TRAMADOL HCL 50 MG PO TABS
50.0000 mg | ORAL_TABLET | Freq: Four times a day (QID) | ORAL | Status: DC | PRN
  Administered 2023-11-13 (×4): 50 mg via ORAL
  Filled 2023-11-12 (×4): qty 1

## 2023-11-12 MED ORDER — PREGABALIN 100 MG PO CAPS
200.0000 mg | ORAL_CAPSULE | Freq: Two times a day (BID) | ORAL | Status: DC
  Administered 2023-11-13 – 2023-11-16 (×7): 200 mg via ORAL
  Filled 2023-11-12 (×7): qty 2

## 2023-11-12 MED ORDER — TRAMADOL HCL 50 MG PO TABS
50.0000 mg | ORAL_TABLET | Freq: Once | ORAL | Status: AC
  Administered 2023-11-12: 50 mg via ORAL
  Filled 2023-11-12: qty 1

## 2023-11-12 MED ORDER — TRAMADOL HCL 50 MG PO TABS: 100.0000 mg | ORAL_TABLET | Freq: Four times a day (QID) | ORAL | Status: DC | PRN

## 2023-11-12 MED ORDER — BENZONATATE 100 MG PO CAPS
200.0000 mg | ORAL_CAPSULE | Freq: Three times a day (TID) | ORAL | Status: DC | PRN
  Administered 2023-11-13: 200 mg via ORAL
  Filled 2023-11-12: qty 2

## 2023-11-12 MED ORDER — ONDANSETRON HCL 4 MG/2ML IJ SOLN
4.0000 mg | Freq: Four times a day (QID) | INTRAMUSCULAR | Status: DC | PRN
  Administered 2023-11-13 – 2023-11-14 (×3): 4 mg via INTRAVENOUS
  Filled 2023-11-12 (×4): qty 2

## 2023-11-12 MED ORDER — SODIUM CHLORIDE 0.9 % IV SOLN
INTRAVENOUS | Status: AC
Start: 2023-11-12 — End: 2023-11-13

## 2023-11-12 MED ORDER — POMALIDOMIDE 3 MG PO CAPS: 3.0000 mg | ORAL_CAPSULE | Freq: Every day | ORAL | Status: DC

## 2023-11-12 MED ORDER — ACETAMINOPHEN 500 MG PO TABS
1000.0000 mg | ORAL_TABLET | Freq: Four times a day (QID) | ORAL | Status: DC | PRN
  Administered 2023-11-13 – 2023-11-16 (×12): 1000 mg via ORAL
  Filled 2023-11-12 (×12): qty 2

## 2023-11-12 MED ORDER — ROSUVASTATIN CALCIUM 20 MG PO TABS
40.0000 mg | ORAL_TABLET | Freq: Every day | ORAL | Status: DC
  Administered 2023-11-13 – 2023-11-16 (×4): 40 mg via ORAL
  Filled 2023-11-12 (×4): qty 2

## 2023-11-12 MED ORDER — FAMCICLOVIR 500 MG PO TABS
250.0000 mg | ORAL_TABLET | Freq: Every day | ORAL | Status: DC
  Administered 2023-11-13 – 2023-11-16 (×4): 250 mg via ORAL
  Filled 2023-11-12 (×4): qty 0.5

## 2023-11-12 MED ORDER — IOHEXOL 350 MG/ML SOLN
100.0000 mL | Freq: Once | INTRAVENOUS | Status: AC | PRN
  Administered 2023-11-12: 100 mL via INTRAVENOUS

## 2023-11-12 MED ORDER — PANTOPRAZOLE SODIUM 40 MG PO TBEC
40.0000 mg | DELAYED_RELEASE_TABLET | Freq: Two times a day (BID) | ORAL | Status: DC
  Administered 2023-11-12 – 2023-11-16 (×8): 40 mg via ORAL
  Filled 2023-11-12 (×8): qty 1

## 2023-11-12 MED ORDER — SODIUM CHLORIDE 0.9 % IV SOLN
500.0000 mg | Freq: Once | INTRAVENOUS | Status: AC
  Administered 2023-11-12: 500 mg via INTRAVENOUS
  Filled 2023-11-12: qty 5

## 2023-11-12 MED ORDER — SODIUM CHLORIDE 0.9 % IV SOLN: 1.0000 g | INTRAVENOUS | Status: DC

## 2023-11-12 MED ORDER — LEVETIRACETAM 500 MG PO TABS
500.0000 mg | ORAL_TABLET | Freq: Two times a day (BID) | ORAL | Status: DC
  Administered 2023-11-12 – 2023-11-16 (×8): 500 mg via ORAL
  Filled 2023-11-12 (×8): qty 1

## 2023-11-12 MED ORDER — ACETAMINOPHEN 500 MG PO TABS
1000.0000 mg | ORAL_TABLET | Freq: Once | ORAL | Status: AC
  Administered 2023-11-12: 1000 mg via ORAL
  Filled 2023-11-12: qty 2

## 2023-11-12 MED ORDER — ACETAMINOPHEN 325 MG PO TABS: 650.0000 mg | ORAL_TABLET | Freq: Four times a day (QID) | ORAL | Status: DC | PRN

## 2023-11-12 MED ORDER — SACCHAROMYCES BOULARDII 250 MG PO CAPS
250.0000 mg | ORAL_CAPSULE | Freq: Every day | ORAL | Status: DC
  Administered 2023-11-13 – 2023-11-16 (×4): 250 mg via ORAL
  Filled 2023-11-12 (×4): qty 1

## 2023-11-12 MED ORDER — ACETAMINOPHEN 650 MG RE SUPP: 650.0000 mg | Freq: Four times a day (QID) | RECTAL | Status: DC | PRN

## 2023-11-12 MED ORDER — GUAIFENESIN ER 600 MG PO TB12
600.0000 mg | ORAL_TABLET | Freq: Two times a day (BID) | ORAL | Status: DC
  Administered 2023-11-12 – 2023-11-13 (×3): 600 mg via ORAL
  Filled 2023-11-12 (×3): qty 1

## 2023-11-12 MED ORDER — SODIUM CHLORIDE 0.9 % IV SOLN
2.0000 g | Freq: Three times a day (TID) | INTRAVENOUS | Status: DC
  Administered 2023-11-12 – 2023-11-16 (×11): 2 g via INTRAVENOUS
  Filled 2023-11-12 (×11): qty 12.5

## 2023-11-12 MED ORDER — SODIUM CHLORIDE 0.9 % IV SOLN
1.0000 g | Freq: Once | INTRAVENOUS | Status: AC
  Administered 2023-11-12: 1 g via INTRAVENOUS
  Filled 2023-11-12: qty 10

## 2023-11-12 MED ORDER — SODIUM CHLORIDE 0.9 % IV SOLN
500.0000 mg | INTRAVENOUS | Status: DC
Start: 2023-11-13 — End: 2023-11-16
  Administered 2023-11-13 – 2023-11-16 (×4): 500 mg via INTRAVENOUS
  Filled 2023-11-12 (×4): qty 5

## 2023-11-12 MED ORDER — HYDROMORPHONE HCL 1 MG/ML IJ SOLN
0.5000 mg | INTRAMUSCULAR | Status: DC | PRN
  Administered 2023-11-12 – 2023-11-16 (×7): 0.5 mg via INTRAVENOUS
  Filled 2023-11-12 (×7): qty 0.5

## 2023-11-12 MED ORDER — NALOXONE HCL 0.4 MG/ML IJ SOLN: 0.4000 mg | INTRAMUSCULAR | Status: DC | PRN

## 2023-11-12 MED ORDER — LIDOCAINE 5 % EX PTCH
1.0000 | MEDICATED_PATCH | CUTANEOUS | Status: DC
  Administered 2023-11-13 – 2023-11-16 (×4): 1 via TRANSDERMAL
  Filled 2023-11-12 (×4): qty 1

## 2023-11-12 MED ORDER — SODIUM CHLORIDE 0.9 % IV BOLUS
1000.0000 mL | Freq: Once | INTRAVENOUS | Status: AC
  Administered 2023-11-12: 1000 mL via INTRAVENOUS

## 2023-11-12 NOTE — H&P (Signed)
 History and Physical      [name]:[id number] DOB: [birth year] DOA: 11/12/2023; DOS: 11/12/2023  PCP: Lelon Norleen ORN., MD  Patient coming from: home   I have personally briefly reviewed patient's old medical records in [facility]  Chief Complaint: Cough  HPI: Bruce Smith is a 73 y.o. male with medical history significant for multiple myeloma status post transplant currently on chemotherapy, coronary disease status post CABG in May 2024, essential pretension, hyperlipidemia, who is admitted to [facility] on 11/12/2023 by way of transfer from [facility] with sepsis due to multifocal community-acquired pneumonia after presenting from home to the latter facility complaining of new onset productive cough.  The patient reports to 3 days of new onset productive cough associated with subjective fever as well as chills, in the absence of any associated full body rigors or generalized myalgias.  There is also been associated with some rhinitis and rhinorrhea, in the absence of a sore throat.  The symptoms are not associated any shortness of breath orthopnea, PND, or worsening of peripheral edema.  He also denies any associated chest pain, palpitations, diaphoresis, presyncope, or syncope.  Denies any new neck stiffness, rash, dysuria or gross hematuria.  He has a history of multiple myeloma status post bone marrow transplant, currently receiving chemotherapy, with most recent such infusion occurring on 11/02/2023.  Follows with Dr. Timmy as outpatient oncologist.  He has a history of associated lytic bone lesions, and has previously undergone radiation to the left humerus, right femur, and the thoracic spine.  He notes some recent worsening of his midline thoracic spine discomfort in the absence of any recent preceding trauma.  Over the last 2-3 days, he also notes generalized weakness in the absence of any acute focal weakness.   Of note, per chart review, most recent  echocardiogram was performed on 03/09/2023 and was notable for LVEF 55 to 60%, normal diastolic parameters, as well as normal right ventricular systolic function.  He is a former smoker, having completely quit smoking in 2000, and denies any known history of underlying COPD or asthma.    [facility] ED Course:  Vital signs in the ED were notable for the following: Temperature max 99.9; heart rates in the 60s 70s; systolic blood pressures in the 1 teens to 120s; respiratory rate 18-27, initial oxygen saturation 93% on room air.  He was subsequent started on 2 L nasal cannula for patient comfort, with ensuing oxygen saturations noted to be 96 to 90% on 2 L nasal cannula.   Labs were notable for the following: CMP was notable for the following: Sodium 136, bicarbonate 21, anion gap 8, creatinine 1.12 compared to 1.22 on 11/02/2023, BUN/creatinine ratio 24.1, calcium  adjusted for mild hypoalbuminemia noted to be 8.9, albumin  3.4, ALT 46.  Otherwise, liver enzymes are within normal limits.  BNP 620 compared to 124 on 504.  High sensitive troponin I x 2 values were both found to be 19, compared to most recent prior high-sensitivity troponin I value of 7465 in May 2024.  Lactic acid 1.9.  CBC notable for white cell count 1200 with absolute neutrophil count 400 compared will with cell count of 800 on 11/02/2023, hemoglobin 11.5 associated Neuraceq/Norocarp properties and nonelevated RDW relative to 11.7 on 11/02/2023, platelet count 6341 on 11/02/2023.  Urinalysis notable for no white blood cells, leukocyte esterase/nitrate negative, specific every 1.025 and demonstrated the presence of hyaline cast.  Blood cultures x 2 were collected prior to  initiation of IV biotics.  COVID, flu Enza, RSV PCR were all negative.  Per my interpretation, EKG in ED demonstrated the following: Sinus rhythm with multiple PVCs, heart rate 81, normal intervals, no evidence of T wave or ST changes, Cleen evidence of ST  elevation.  Imaging in the ED, per corresponding formal radiology read, was notable for the following: CTA chest with PE protocol relative to most recent prior CTA chest with PE protocol performed on 08/03/2023 showed no evidence of acute pulmonary embolism while showing patchy irregular or less opacities in the left lower lung along with airspace opacities in the medial right lower lobe, consistent with multifocal pneumonia will also demonstrating a new lytic lesion in the T4 vertebral body, consistent with progression of multiple myeloma, will demonstrating no evidence of edema, pleural effusion, or pneumothorax.  While in the ED, the following were administered: Acetaminophen  1 g p.o. x 2 doses, tramadol  50 mg p.o. x 1 dose, azithromycin , Rocephin , normal saline nasal labels.  Subsequently, the patient was admitted for further evaluation management of sepsis due to multifocal community-acquired pneumonia, complicated by pancytopenia with neutropenia, clinical evidence of dehydration, with presentation also notable for generalized weakness.    Review of Systems: As per HPI otherwise 10 point review of systems negative.   Past Medical History:  Diagnosis Date   Atherosclerosis    Coronary artery disease    Goals of care, counseling/discussion 06/29/2020   History of radiation therapy    Left Humerus, T-spine, Pelvis- 09/19/22-10/04/22- Dr. Lynwood Nasuti   History of radiation therapy    Right Femur- 01/15/23-01/16/23- Dr. Lynwood Nasuti   History of radiation therapy    Thoracic spine- 06/06/23-06/19/23- Dr. 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   Multiple myeloma (HCC) 07/21/2020   Myocardial infarction [facility])     Past Surgical History:  Procedure Laterality Date   ANTERIOR CERVICAL DECOMP/DISCECTOMY FUSION N/A 06/17/2020    Procedure: ANTERIOR CERVICAL DECOMPRESSION/DISCECTOMY FUSION, INTERBODY PROSTHESIS, PLATE/SCREWS CERVICAL FIVE- CERVICAL SIX;  Surgeon: Mavis Purchase, MD;  [facility];  Service: Neurosurgery;  Laterality: N/A;  ANTERIOR CERVICAL DECOMPRESSION/DISCECTOMY FUSION, INTERBODY PROSTHESIS, PLATE/SCREWS CERVICAL FIVE- CERVICAL SIX   BIOPSY  02/24/2021   Procedure: BIOPSY;  Surgeon: Elicia Claw, MD;  Location: [facility] ENDOSCOPY;  Service: Gastroenterology;;   CARDIAC CATHETERIZATION     CORONARY ARTERY BYPASS GRAFT N/A 03/13/2023   Procedure: CORONARY ARTERY BYPASS GRAFTING (CABG) X3, USING RIGHT LEG GREATER SAPHENOUS VEIN HARVESTED ENDOSCOPICALLY & LEFT IMA;  Surgeon: Lucas Dorise POUR, MD;  Location: [facility] OR;  Service: Open Heart Surgery;  Laterality: N/A;   ESOPHAGOGASTRODUODENOSCOPY N/A 02/24/2021   Procedure: ESOPHAGOGASTRODUODENOSCOPY (EGD);  Surgeon: Elicia Claw, MD;  Location: [facility];  Service: Gastroenterology;  Laterality: N/A;   FEMUR IM NAIL Left 07/14/2020   Procedure: OPEN REDUCTION INTERNAL FIXATION (ORIF LEFT FEMORAL NAIL FRACTURE;  Surgeon: Josefina Chew, MD;  Location: [facility] OR;  Service: Orthopedics;  Laterality: Left;   LEFT HEART CATH AND CORONARY ANGIOGRAPHY N/A 03/09/2023   Procedure: LEFT HEART CATH AND CORONARY ANGIOGRAPHY;  Surgeon: Court Dorn PARAS, MD;  Location: [facility] INVASIVE CV LAB;  Service: Cardiovascular;  Laterality: N/A;   TEE WITHOUT CARDIOVERSION N/A 03/13/2023   Procedure: TRANSESOPHAGEAL ECHOCARDIOGRAM;  Surgeon: Lucas Dorise POUR, MD;  Location: [facility] OR;  Service: Open Heart Surgery;  Laterality: N/A;    Social History:  reports that  he quit smoking about 25 years ago. His smoking use included cigarettes. He started smoking about 40 years ago. He has a 15 pack-year smoking history. His smokeless tobacco use includes chew. He reports that he does not currently use alcohol. He reports that he does not use drugs.   No Known Allergies  Family History  Problem  Relation Age of Onset   Stroke Mother    Prostate cancer Paternal Grandfather     Family history reviewed and not pertinent    Prior to Admission medications   Medication Sig Start Date End Date Taking? Authorizing Provider  ACETAMINOPHEN  ER PO Take 1,000 mg by mouth in the morning, at noon, in the evening, and at bedtime.    [provider]  aspirin  EC 81 MG tablet Take 81 mg by mouth at bedtime.    [provider]  calcium  carbonate (OS-CAL - DOSED IN MG OF ELEMENTAL CALCIUM ) 1250 (500 Ca) MG tablet Take 1 tablet (500 mg of elemental calcium  total) by mouth 2 (two) times daily with a meal. 03/17/21   Love, Sharlet RAMAN, PA-C  celecoxib  (CELEBREX ) 100 MG capsule Take 100 mg by mouth 2 (two) times daily. 03/17/21   [provider]  dexamethasone  (DECADRON ) 4 MG tablet Take 5 tablets (20 mg) for 1 day on the week after chemotherapy (day 22). 10/11/23   Timmy Maude SAUNDERS, MD  famciclovir  (FAMVIR ) 250 MG tablet Take 1 tablet (250 mg total) by mouth daily. 10/19/23   Timmy Maude SAUNDERS, MD  furosemide  (LASIX ) 20 MG tablet Take 1 tablet by mouth daily for 3 days, then take daily as needed thereafter for lower extremity edema 04/03/23   Lucien Orren SAILOR, PA-C  Ipratropium-Albuterol  (COMBIVENT) 20-100 MCG/ACT AERS respimat Inhale 1 puff into the lungs every 6 (six) hours as needed for wheezing. Patient not taking: Reported on 09/06/2023 08/03/23   Timmy Maude SAUNDERS, MD  levETIRAcetam  (KEPPRA ) 500 MG tablet TAKE 1 TABLET BY MOUTH 2 TIMES A DAY 07/24/23   Timmy Maude SAUNDERS, MD  loperamide  (IMODIUM ) 2 MG capsule Take 1 capsule (2 mg total) by mouth as needed for diarrhea or loose stools. Patient not taking: Reported on 06/14/2023 03/17/21   Love, Pamela S, PA-C  Magnesium  Oxide 140 MG CAPS Take 250 mg by mouth daily.    [provider]  metoprolol  tartrate (LOPRESSOR ) 25 MG tablet TAKE 1 TABLET BY MOUTH 2 TIMES A DAY 08/21/23   Conte, Tessa N, PA-C  naloxone  (NARCAN ) nasal spray 4  mg/0.1 mL Administer as directed in each nostril in the event of an overdose. May repeat in 15 minutes. 10/17/23   Timmy Maude SAUNDERS, MD  nitroGLYCERIN  (NITROSTAT ) 0.4 MG SL tablet Place 0.4 mg under the tongue every 5 (five) minutes as needed. Patient not taking: Reported on 06/14/2023 06/09/23   [provider]  ondansetron  (ZOFRAN ) 8 MG tablet Take 1 tablet (8 mg total) by mouth every 8 (eight) hours as needed for nausea or vomiting. 10/11/23   Timmy Maude SAUNDERS, MD  pantoprazole  (PROTONIX ) 40 MG tablet TAKE 1 TABLET BY MOUTH 2 TIMES A DAY BEFORE MEALS FOR ACID REFLUX 10/23/23   Timmy Maude SAUNDERS, MD  polyethylene glycol powder (GLYCOLAX /MIRALAX ) 17 GM/SCOOP powder Mix 17g (1 capful) with 8 oz of favorite beverage and drink every morning    [provider]  pomalidomide  (POMALYST ) 3 MG capsule Take 1 capsule (3 mg total) by mouth daily. Take 21 days on, 7 days off, repeat every 28 days. [employer]  Auth # [id number] Date Obtained 11/07/23 11/07/23   Timmy Maude SAUNDERS, MD  pregabalin  (LYRICA ) 200 MG capsule TAKE 1 CAPSULE BY MOUTH 2 TIMES DAILY 08/08/23   Timmy Maude SAUNDERS, MD  prochlorperazine  (COMPAZINE ) 10 MG tablet Take 1 tablet (10 mg total) by mouth every 6 (six) hours as needed for nausea or vomiting. 10/11/23   Timmy Maude SAUNDERS, MD  rosuvastatin  (CRESTOR ) 40 MG tablet TAKE 1 TABLET BY MOUTH EVERY DAY FOR CHOLESTEROL 10/23/23   Timmy Maude SAUNDERS, MD  saccharomyces boulardii (FLORASTOR) 250 MG capsule Take 250 mg by mouth daily. 03/17/21   [provider]  tamsulosin  (FLOMAX ) 0.4 MG CAPS capsule TAKE 1 CAPSULE BY MOUTH EVERY NIGHT AT BEDTIME 10/02/23   Timmy Maude SAUNDERS, MD  traMADol  (ULTRAM ) 50 MG tablet TAKE 2 TABLETS BY MOUTH EVERY 6 HOURS ASNEEDED FOR PAIN 11/06/23   Timmy Maude SAUNDERS, MD     Objective    Physical Exam: Vitals:   11/12/23 1330 11/12/23 1606 11/12/23 1637 11/12/23 1923  BP: (!) 119/49  (!) 110/47 (!) 121/49  Pulse: 68  67 71  Resp: (!) 24  20 18   Temp:  99.9 F  (37.7 C)  98.8 F (37.1 C)  TempSrc:  Oral  Oral  SpO2: 96%  97% 95%  Weight:      Height:        General: appears to be stated age; alert, oriented Skin: warm, dry, no rash Head:  AT/[place] Mouth:  Oral mucosa membranes appear dry, normal dentition Neck: supple; trachea midline Heart:  RRR; did not appreciate any M/R/G Lungs: CTAB, did not appreciate any wheezes, rales, or rhonchi Abdomen: + BS; soft, ND, NT Vascular: 2+ pedal pulses b/l; 2+ radial pulses b/l Extremities: no peripheral edema, no muscle wasting Neuro: strength and sensation intact in upper and lower extremities b/l    Labs on Admission: I have personally reviewed following labs and imaging studies  CBC: Recent Labs  Lab 11/12/23 1228 11/12/23 1423  WBC SPECIMEN CLOTTED NOTIFIED SOPHIE GOUGE RN 1350 11/12/23 CMR 1.2*  NEUTROABS PENDING 0.4*  HGB SPECIMEN CLOTTED NOTIFIED SOPHIE GOUGE RN 1350 11/12/23 CMR 11.5*  HCT SPECIMEN CLOTTED NOTIFIED SOPHIE GOUGE RN 1350 11/12/23 CMR 35.4*  MCV SPECIMEN CLOTTED NOTIFIED SOPHIE GOUGE RN 1350 11/12/23 CMR 93.2  PLT SPECIMEN CLOTTED NOTIFIED SOPHIE GOUGE RN 1350 11/12/23 CMR 63*   Basic Metabolic Panel: Recent Labs  Lab 11/12/23 1228  NA 136  K 3.5  CL 107  CO2 21*  GLUCOSE 109*  BUN 27*  CREATININE 1.12  CALCIUM  8.5*   GFR: Estimated Creatinine Clearance: 66.4 mL/min (by C-G formula based on SCr of 1.12 mg/dL). Liver Function Tests: Recent Labs  Lab 11/12/23 1228  AST 27  ALT 46*  ALKPHOS 80  BILITOT 0.6  PROT 6.2*  ALBUMIN  3.4*   No results for input(s): LIPASE, AMYLASE in the last 168 hours. No results for input(s): AMMONIA in the last 168 hours. Coagulation Profile: No results for input(s): INR, PROTIME in the last 168 hours. Cardiac Enzymes: No results for input(s): CKTOTAL, CKMB, CKMBINDEX, TROPONINI in the last 168 hours. BNP (last 3 results) No results for input(s): PROBNP in the last 8760 hours. HbA1C: No results for  input(s): HGBA1C in the last 72 hours. CBG: No results for input(s): GLUCAP in the last 168 hours. Lipid Profile: No results for input(s): CHOL, HDL, LDLCALC, TRIG, CHOLHDL, LDLDIRECT in the last 72 hours. Thyroid  Function Tests: No results for input(s): TSH, T4TOTAL, FREET4, T3FREE,  THYROIDAB in the last 72 hours. Anemia Panel: No results for input(s): VITAMINB12, FOLATE, FERRITIN, TIBC, IRON, RETICCTPCT in the last 72 hours. Urine analysis:    Component Value Date/Time   COLORURINE YELLOW 11/12/2023 1230   APPEARANCEUR CLEAR 11/12/2023 1230   LABSPEC 1.025 11/12/2023 1230   PHURINE 5.5 11/12/2023 1230   GLUCOSEU NEGATIVE 11/12/2023 1230   HGBUR NEGATIVE 11/12/2023 1230   BILIRUBINUR NEGATIVE 11/12/2023 1230   KETONESUR NEGATIVE 11/12/2023 1230   PROTEINUR 30 (A) 11/12/2023 1230   NITRITE NEGATIVE 11/12/2023 1230   LEUKOCYTESUR NEGATIVE 11/12/2023 1230    Radiological Exams on Admission: CT Angio Chest PE W and/or Wo Contrast Result Date: 11/12/2023 CLINICAL DATA:  Cough, chills, and weakness. Currently on chemotherapy for multiple myeloma. Nodule seen on chest x-ray. EXAM: CT ANGIOGRAPHY CHEST WITH CONTRAST TECHNIQUE: Multidetector CT imaging of the chest was performed using the standard protocol during bolus administration of intravenous contrast. Multiplanar CT image reconstructions and MIPs were obtained to evaluate the vascular anatomy. RADIATION DOSE REDUCTION: This exam was performed according to the departmental dose-optimization program which includes automated exposure control, adjustment of the mA and/or kV according to patient size and/or use of iterative reconstruction technique. CONTRAST:  OMNIPAQUE  IOHEXOL  350 MG/ML SOLN COMPARISON:  Chest x-ray from same day. PET-CT dated September 26, 2023. CT chest dated August 03, 2023. FINDINGS: Cardiovascular: Satisfactory opacification of the pulmonary arteries to the segmental level. No  evidence of pulmonary embolism. Normal heart size status post CABG. No pericardial effusion. No thoracic aortic aneurysm or dissection. Coronary, aortic arch, and branch vessel atherosclerotic vascular disease. Mediastinum/Nodes: Left AP window lymph node currently measures 1.4 cm in short axis, previously 1.8 cm. No other enlarged mediastinal or hilar lymph node. No enlarged axillary lymph nodes. Thyroid  gland, trachea, and esophagus demonstrate no significant findings. Lungs/Pleura: New patchy irregular ground-glass densities primarily in the left lower lobe, with additional small, more dense consolidative opacity in the medial right lower lobe. Unchanged scarring in the left upper lobe. Scattered small pulmonary nodules in both lungs measuring up to 7 mm are unchanged. Upper Abdomen: No acute abnormality. Musculoskeletal: No chest wall abnormality. New lytic lesion in the T4 vertebral body (series 602, image 104). Other treated mixed sclerotic and lucent lesions within multiple thoracic vertebral bodies are unchanged. Old fracture deformity of the left scapular body again noted. Review of the MIP images confirms the above findings. IMPRESSION: 1. No evidence of pulmonary embolism. 2. New patchy irregular ground-glass densities primarily in the left lower lobe, with additional small, more dense consolidative opacity in the medial right lower lobe, consistent with multifocal pneumonia. This accounts for the density seen on chest x-ray. 3. New lytic lesion in the T4 vertebral body, consistent with progression of multiple myeloma. 4.  Aortic Atherosclerosis (ICD10-I70.0). Electronically Signed   By: Elsie ONEIDA Shoulder M.D.   On: 11/12/2023 16:30   DG Chest Port 1 View Result Date: 11/12/2023 CLINICAL DATA:  Cough, chills, and weakness. Currently on chemotherapy for multiple myeloma. EXAM: PORTABLE CHEST 1 VIEW COMPARISON:  PET-CT dated September 26, 2023. Chest x-ray dated August 02, 2023. FINDINGS: The heart size and  mediastinal contours are within normal limits. Prior CABG. Normal pulmonary vascularity. Similar scarring in the left upper lobe. 2.9 cm spiculated nodule in the mid left lung, not seen on the prior x-ray and without clear correlate on recent PET-CT. No focal consolidation, pleural effusion, or pneumothorax. No acute osseous abnormality. Chronic fracture deformity of the left scapula again noted. IMPRESSION:  1. 2.9 cm spiculated nodule in the mid left lung, not seen on the prior x-ray and without clear correlate on recent PET-CT. Differential considerations include infection or malignancy. Recommend further evaluation with chest CT. Electronically Signed   By: Elsie ONEIDA Shoulder M.D.   On: 11/12/2023 12:33      Assessment/Plan    Principal Problem:   CAP (community acquired pneumonia) Active Problems:   Essential hypertension   Generalized weakness   Sepsis (HCC)   Pancytopenia (HCC)   Neutropenia (HCC)   Acute on chronic back pain   Elevated troponin   Dehydration   Acute prerenal azotemia   History of multiple myeloma   HLD (hyperlipidemia)   GERD (gastroesophageal reflux disease)    #) Sepsis due to multifocal community-acquired pneumonia: Diagnosis on the basis of presenting 2 to 3 days of new onset productive cough, subjective fever, chills, with elevated presenting temperature of 99.9, and CTA chest showing evidence of multifocal pneumonia.  It is noted that COVID, flu Enza, RSV PCR were all negative.  SIRS criteria met via leukopenia with neutropenia and associated absolute neutrophil count of 400, tachypnea. Of note, in the absence of any evidence of end organ damage, including a non-elevated presenting LA of 1.9, pt's sepsis does not meet criteria to be considered severe in nature. Also, in the absence of LA level greater than or equal to 4.0, and in the absence of any associated hypotension refractory to IVF's, there are no indications for administration of a 30 mL/kg IVF bolus at  this time.   Additional ED work-up/management notable for: Collection of blood cultures x 2 followed by initiation of azithromycin  and Rocephin .  However, given the patient's presenting neutropenia with absolute neutrophil count of 400 along with his elevated temperature, will convert Rocephin  to neutropenic fever dosing of cefepime  at this time.  Will continue azithromycin  for maintenance of atypical coverage for community-acquired pneumonia.  No e/o additional infectious process at this time, including urinalysis that was inconsistent with UTI.  Plan: CBC w/ diff and CMP in AM. Follow for results of blood cx's x 2. Abx: Azithromycin , neutropenic fever dosing of cefepime , as above.  Normal saline at 75 cc/h x 12 hours.  Monitor strict I's and O's and daily weights.  Flutter valve, incentive [place], scheduled Mucinex , prn Tessalon  Perles.  Add on procalcitonin level, mycoplasma antibodies, and strep urine antigen.  Prn acetaminophen  for fever.  Monitor on telemetry.                  #) Generalized weakness: 2-3 duration of generalized weakness, in the absence of any evidence of acute focal neurologic deficits, including no evidence of acute focal weakness to suggest acute CVA.  Suspect contribution from physiologic stress stemming from presenting sepsis due to multifocal community-acquired pneumonia superimposed on physiologic stress from his underlying multiple [place] for which she is undergoing chemotherapy.  No e/o additional infectious process at this time, as above.   Will further eval for any additional contributions from endocrine/metabolic sources, as detailed below.   Plan: work-up and management of presenting sepsis due to community-acquired pneumonia, as described above. PT/OT consults ordered for the AM. Fall precautions. CMP/CBC in the AM. Check TSH, serum Mg level. Check B12 level.                   # ) acute on chronic thoracic back discomfort.  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  Patient conveys that his outpatient pain control regimen includes as needed tramadol  50 mg as well as prn acetaminophen  1 g.  Plan: Resume home prn tramadol  prn acetaminophen .  Add as needed IV Dilaudid  for severe pain.  Lidocaine  patch to the affected area involving the thoracic spine.  Incentive spirometry. I have added his outpatient oncologist, Dr. Ennever, to the treatment team starting at 7 AM on 1/13.  PT/OT consults in the morning.                 #) Elevated troponin: mildly elevated initial troponin of 19, with repeat value unchanged.  This appears significantly reduced relative to most recent prior high sensitive troponin data point of [id number] in May 2024.  Suspect that today's mild elevation is on the basis of supply demand mismatch in the setting of sepsis due to community-acquired pneumonia as opposed to representing a type I process due to acute plaque rupture. EKG shows no evidence of acute ischemic changes, including no evidence of STEMI, and CTA chest shows no evidence of acute pulmonary embolism or any evidence of pneumothorax . Additionally, presentation is not associated with any CP.  Overall, ACS is felt to be less likely relative to type 2 supply demand mismatch, as above, but will closely monitor on telemetry overnight while treating suspected underlying sepsis due to community-acquired pneumonia.  Repeat troponin levels currently pending.  Plan: Follow-up for result of repeat troponin level.  Monitor on telemetry. PRN EKG for development of chest pain. Check serum Mg level and repeat CMP in the morning.  Repeat CBC in the AM. Additional evaluation and management of presenting sepsis due  to multifocal community-acquired pneumonia as suspected driving force behind mildly elevated troponin, as above.                 #) Pancytopenia: Presenting CBC reflects pancytopenia with stable hemoglobin relative to most recent prior CBC from 11/02/2023, while platelet count is now slightly higher than most recent prior CBC from 11/02/2023.  Today's CBC reflects interval development of leukopenia with neutropenia, consistent with timing of most recent prior chemotherapy infusion on 11/02/2023.  Given stable hemoglobin as well as increase in platelet count, presentation appears less suggestive of DIC.  However, in the setting of his presenting acute infection, will continue to closely monitor trend in hemoglobin and platelet count, and also check PTT and INR.  Plan: Repeat CBC with differential in the morning.  Check PTT, INR.                      #) Dehydration: Clinical suspicion for such, including the appearance of dry oral mucous membranes as well as laboratory findings notable for acute prerenal azotemia and UA demonstrating elevated specific gravity, and presence of hyaline casts.  Appears to be in the setting of ostensible losses in the setting of his presenting sepsis due to multifocal community-acquired pneumonia.  No e/o associated hypotension.  Of note, most recent echocardiogram from May 2024 shows she has preserved LVEF as well as normal diastolic parameters.   Plan: Monitor strict I's and O's.  Daily weights.  CMP in the morning. IVF's in form of normal saline at 75 cc/h x 12 hours.                 #)  History of multiple myeloma: Documented history of such, status post bone marrow transplant, currently on chemotherapy, with most recent infusion occurring on 11/02/2023.  Follows with Dr. Timmy as his outpatient oncologist.  Next scheduled chemotherapy infusion is set to occur on 11/16/2023.  Also on  pomalidomide  As an outpatient.  Today CTA chest  with PE protocol she has a new lytic lesion in the T4 vertebral body suggestive of progression of his multiple myeloma.  Of note, he has a history of multiple prior lytic bone lesions, for which she has undergone radiation to the left humerus, right femur and thoracic spine.  Plan:  I have added his outpatient oncologist, Dr. Ennever, to the treatment team starting at 7 AM on 1/13.  Repeat CBC with differential in the morning.  Resume outpatient tramadol , outpatient prn acetaminophen .  Outside of prn IV Dilaudid  for breakthrough severe pain as well as lidocaine  patch to the thoracic spine.  Incentive spirometry.  Repeat CMP in the morning.  Continue outpatient pomalidomide .                 #) Essential Hypertension: documented h/o such, with outpatient antihypertensive regimen including metoprolol  to tartrate 25 mg p.o. twice daily.  SBP's in the ED today: 1 teens to 120s mmHg.   Plan: Close monitoring of subsequent BP via routine VS. in the setting of the patient's presenting acute infection complicated by sepsis, will hold home beta-blocker for now.  Monitor on telemetry.                   #) Hyperlipidemia: documented h/o such. On high intensity rosuvastatin  as outpatient.   Plan: continue home statin.                   #) GERD: documented h/o such; on Protonix  as outpatient.   Plan: continue home PPI.     DVT prophylaxis: SCD's   Code Status: Full code Family Communication: I discussed the patient's case with his wife, who was present at bedside. Disposition Plan: Per Rounding Team Consults called:  I have added his outpatient oncologist, Dr. Timmy, to the treatment team starting at 7 AM on 1/13.;  Admission status: Inpatient    I SPENT GREATER THAN 75  MINUTES IN CLINICAL CARE TIME/MEDICAL DECISION-MAKING IN COMPLETING THIS ADMISSION.     Lonnell Chaput B Shawn Dannenberg DO [facility] From 7PM - 7AM   11/12/2023, 7:27 PM

## 2023-11-12 NOTE — Plan of Care (Signed)
 72 year old M with PMH of MM s/p BMT [facility]), currently on chemotherapy and followed by Dr. Timmy, CAD/CABG, HTN and HLD presented to med [facility] with runny nose, productive cough, dizziness, chills, generalized weakness and feeling unwell.  Vitals in ED significant for tachypnea and mild temp to 99.9.  No documented desaturation but started on 2 L.  CMP without significant finding.  CBC with leukopenia/neutropenia and thrombocytopenia.  BNP 619.  Troponin 19 x 2.  Lactic acid 1.9.  UA without significant finding.  CT angio chest negative for PE but concerning for multifocal pneumonia and new lytic lesion in T4 vertebral body consistent with progression of MM.  Blood cultures obtained.  Received 1 L bolus.  Started on ceftriaxone  and Zithromax .  Admission requested for multifocal pneumonia.  Accepted to progressive care at [facility].

## 2023-11-12 NOTE — ED Provider Notes (Addendum)
 [place] [facility] Provider Note  CSN: [id number] Arrival date & time: 11/12/23 1145  Chief Complaint(s) Shortness of Breath  HPI Bruce Smith is a 73 y.o. male with past medical history as below, significant for multiple myeloma s/p BMT ([facility]), follows with Dr. Timmy, CABG x 3, radiation therapy, HLD, HTN who presents to the ED with complaint of cough, dyspnea  Feeling unwell the past 3 to 4 days, productive cough with green sputum.  Exertional dyspnea, body aches, chills.  No fever.  Does not use home oxygen but was found to be hypoxic prior to arrival.  Spouse with cough. Current chemo.  Carfilzomib  D1,8,15 (20/27) + Pomalidomide  + Dexamethasone  (KPd) q28d   Past Medical History Past Medical History:  Diagnosis Date   Atherosclerosis    Coronary artery disease    Goals of care, counseling/discussion 06/29/2020   History of radiation therapy    Left Humerus, T-spine, Pelvis- 09/19/22-10/04/22- Dr. Lynwood Nasuti   History of radiation therapy    Right Femur- 01/15/23-01/16/23- Dr. Lynwood Nasuti   History of radiation therapy    Thoracic spine- 06/06/23-06/19/23- Dr. 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   Coronary artery disease 03/13/2023   NSTEMI (non-ST elevated myocardial infarction) (HCC) 03/09/2023   Hypomagnesemia 03/23/2021   Hypocalcemia 03/23/2021   Physical debility 03/10/2021   Debility    Enterococcus UTI    Pain in both feet    Malnutrition of moderate degree 03/05/2021   FTT (failure to thrive) in adult 02/23/2021   AKI (acute kidney injury) (HCC) 10/29/2020   Abnormal  neurological exam 10/29/2020   Essential hypertension 10/29/2020   CAD (coronary artery disease) 10/29/2020   Intractable pain 10/26/2020   Kappa light chain myeloma (HCC) 07/21/2020   Hypercalcemia of malignancy 06/30/2020   Goals of care, counseling/discussion 06/29/2020   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 Authorizing Provider  ACETAMINOPHEN  ER PO Take 1,000 mg by mouth in the morning, at noon, in the evening, and at bedtime.    [provider]  aspirin  EC 81 MG tablet Take 81 mg by mouth at bedtime.    [provider]  calcium  carbonate (OS-CAL - DOSED IN MG OF ELEMENTAL CALCIUM ) 1250 (500 Ca) MG tablet Take 1 tablet (500 mg of elemental calcium  total) by mouth 2 (two) times daily with a meal. 03/17/21   Love, Sharlet RAMAN, PA-C  celecoxib  (CELEBREX ) 100 MG capsule Take 100 mg by mouth 2 (two) times daily. 03/17/21   [provider]  dexamethasone  (DECADRON ) 4 MG tablet Take 5 tablets (20 mg) for 1 day on the week after chemotherapy (day 22). 10/11/23   Timmy Maude SAUNDERS, MD  famciclovir  (FAMVIR ) 250 MG tablet Take 1 tablet (250 mg total) by mouth daily. 10/19/23   Timmy Maude SAUNDERS, MD  furosemide  (LASIX ) 20 MG tablet Take 1 tablet by mouth daily for 3 days, then take daily as needed thereafter for lower extremity edema 04/03/23   Lucien,  Orren SAILOR, PA-C  Ipratropium-Albuterol  (COMBIVENT) 20-100 MCG/ACT AERS respimat Inhale 1 puff into the lungs every 6 (six) hours as needed for wheezing. Patient not taking: Reported on 09/06/2023 08/03/23   Timmy Maude SAUNDERS, MD  levETIRAcetam  (KEPPRA ) 500 MG tablet TAKE 1 TABLET BY MOUTH 2 TIMES A DAY 07/24/23   Timmy Maude SAUNDERS, MD  loperamide  (IMODIUM ) 2 MG capsule Take 1 capsule (2 mg total) by mouth as needed for diarrhea or loose stools. Patient  not taking: Reported on 06/14/2023 03/17/21   Love, Pamela S, PA-C  Magnesium  Oxide 140 MG CAPS Take 250 mg by mouth daily.    [provider]  metoprolol  tartrate (LOPRESSOR ) 25 MG tablet TAKE 1 TABLET BY MOUTH 2 TIMES A DAY 08/21/23   Lucien, Tessa N, PA-C  naloxone  (NARCAN ) nasal spray 4 mg/0.1 mL Administer as directed in each nostril in the event of an overdose. May repeat in 15 minutes. 10/17/23   Timmy Maude SAUNDERS, MD  nitroGLYCERIN  (NITROSTAT ) 0.4 MG SL tablet Place 0.4 mg under the tongue every 5 (five) minutes as needed. Patient not taking: Reported on 06/14/2023 06/09/23   [provider]  ondansetron  (ZOFRAN ) 8 MG tablet Take 1 tablet (8 mg total) by mouth every 8 (eight) hours as needed for nausea or vomiting. 10/11/23   Timmy Maude SAUNDERS, MD  pantoprazole  (PROTONIX ) 40 MG tablet TAKE 1 TABLET BY MOUTH 2 TIMES A DAY BEFORE MEALS FOR ACID REFLUX 10/23/23   Timmy Maude SAUNDERS, MD  polyethylene glycol powder (GLYCOLAX /MIRALAX ) 17 GM/SCOOP powder Mix 17g (1 capful) with 8 oz of favorite beverage and drink every morning    [provider]  pomalidomide  (POMALYST ) 3 MG capsule Take 1 capsule (3 mg total) by mouth daily. Take 21 days on, 7 days off, repeat every 28 days. [employer] Auth # [id number] Date Obtained 11/07/23 11/07/23   Timmy Maude SAUNDERS, MD  pregabalin  (LYRICA ) 200 MG capsule TAKE 1 CAPSULE BY MOUTH 2 TIMES DAILY 08/08/23   Timmy Maude SAUNDERS, MD  prochlorperazine  (COMPAZINE ) 10 MG tablet Take 1 tablet (10 mg total) by mouth every 6 (six) hours as needed for nausea or vomiting. 10/11/23   Timmy Maude SAUNDERS, MD  rosuvastatin  (CRESTOR ) 40 MG tablet TAKE 1 TABLET BY MOUTH EVERY DAY FOR CHOLESTEROL 10/23/23   Timmy Maude SAUNDERS, MD  saccharomyces boulardii (FLORASTOR) 250 MG capsule Take 250 mg by mouth daily. 03/17/21   [provider]  tamsulosin  (FLOMAX ) 0.4 MG CAPS capsule TAKE 1 CAPSULE BY MOUTH EVERY NIGHT AT BEDTIME 10/02/23   Timmy Maude SAUNDERS, MD  traMADol  (ULTRAM ) 50  MG tablet TAKE 2 TABLETS BY MOUTH EVERY 6 HOURS ASNEEDED FOR PAIN 11/06/23   Timmy Maude SAUNDERS, MD                                                                                                                                    Past Surgical History Past  Surgical History:  Procedure Laterality Date   ANTERIOR CERVICAL DECOMP/DISCECTOMY FUSION N/A 06/17/2020   Procedure: ANTERIOR CERVICAL DECOMPRESSION/DISCECTOMY FUSION, INTERBODY PROSTHESIS, PLATE/SCREWS CERVICAL FIVE- CERVICAL SIX;  Surgeon: Mavis Purchase, MD;  Location: [facility] OR;  Service: Neurosurgery;  Laterality: N/A;  ANTERIOR CERVICAL DECOMPRESSION/DISCECTOMY FUSION, INTERBODY PROSTHESIS, PLATE/SCREWS CERVICAL FIVE- CERVICAL SIX   BIOPSY  02/24/2021   Procedure: BIOPSY;  Surgeon: Elicia Claw, MD;  Location: [facility] ENDOSCOPY;  Service: Gastroenterology;;   CARDIAC CATHETERIZATION     CORONARY ARTERY BYPASS GRAFT N/A 03/13/2023   Procedure: CORONARY ARTERY BYPASS GRAFTING (CABG) X3, USING RIGHT LEG GREATER SAPHENOUS VEIN HARVESTED ENDOSCOPICALLY & LEFT IMA;  Surgeon: Lucas Dorise POUR, MD;  Location: [facility] OR;  Service: Open Heart Surgery;  Laterality: N/A;   ESOPHAGOGASTRODUODENOSCOPY N/A 02/24/2021   Procedure: ESOPHAGOGASTRODUODENOSCOPY (EGD);  Surgeon: Elicia Claw, MD;  Location: [facility];  Service: Gastroenterology;  Laterality: N/A;   FEMUR IM NAIL Left 07/14/2020   Procedure: OPEN REDUCTION INTERNAL FIXATION (ORIF LEFT FEMORAL NAIL FRACTURE;  Surgeon: Josefina Chew, MD;  Location: [facility] OR;  Service: Orthopedics;  Laterality: Left;   LEFT HEART CATH AND CORONARY ANGIOGRAPHY N/A 03/09/2023   Procedure: LEFT HEART CATH AND CORONARY ANGIOGRAPHY;  Surgeon: Court Dorn PARAS, MD;  Location: [facility] INVASIVE CV LAB;  Service: Cardiovascular;  Laterality: N/A;   TEE WITHOUT CARDIOVERSION N/A 03/13/2023   Procedure: TRANSESOPHAGEAL ECHOCARDIOGRAM;  Surgeon: Lucas Dorise POUR, MD;  Location[facility] OR;  Service: Open Heart Surgery;  Laterality: N/A;    Family History Family History  Problem Relation Age of Onset   Stroke Mother    Prostate cancer Paternal Grandfather     Social History Social History   Tobacco Use   Smoking status: Former    Current packs/day: 0.00    Average packs/day: 1 pack/day for 15.0 years (15.0 ttl pk-yrs)    Types: Cigarettes    Start date: 11/01/1983    Quit date: 10/31/1998    Years since quitting: 25.0   Smokeless tobacco: Current    Types: Chew  Vaping Use   Vaping status: Never Used  Substance Use Topics   Alcohol use: Not Currently    Comment: rare   Drug use: Never   Allergies Patient has no known allergies.  Review of Systems Review of Systems  Constitutional:  Positive for appetite change and chills. Negative for fever.  HENT:  Positive for congestion, postnasal drip, rhinorrhea and sore throat.   Respiratory:  Positive for cough and shortness of breath. Negative for chest tightness.   Cardiovascular:  Negative for chest pain and palpitations.  Gastrointestinal:  Negative for abdominal pain, diarrhea and nausea.  Genitourinary:  Negative for difficulty urinating and dysuria.  Musculoskeletal:  Positive for arthralgias.  Skin:  Negative for rash.  Neurological:  Positive for light-headedness. Negative for syncope.    Physical Exam Vital Signs  I have reviewed the triage vital signs BP (!) 119/49   Pulse 68   Temp 99.4 F (37.4 C) (Oral)   Resp (!) 24   Ht 5' 8 (1.727 m)   Wt 94.5 kg   SpO2 96%   BMI 31.68 kg/m  Physical Exam Vitals and nursing note reviewed.  Constitutional:      Appearance: He is well-developed. He is ill-appearing.  HENT:     Head: Normocephalic and atraumatic.     Right Ear: External ear normal.     Left Ear: External ear normal.     Mouth/Throat:     Mouth: Mucous membranes are  dry.  Eyes:     General: No scleral icterus. Cardiovascular:     Rate and Rhythm: Normal rate and regular rhythm.     Pulses: Normal pulses.     Heart sounds: Normal  heart sounds.  Pulmonary:     Effort: Tachypnea and respiratory distress present.     Breath sounds: Decreased breath sounds and rhonchi present.     Comments: Coarse b/l Abdominal:     General: Abdomen is flat.     Palpations: Abdomen is soft.     Tenderness: There is no abdominal tenderness.  Musculoskeletal:     Cervical back: No rigidity.     Right lower leg: No edema.     Left lower leg: No edema.  Skin:    General: Skin is warm and dry.     Capillary Refill: Capillary refill takes less than 2 seconds.  Neurological:     Mental Status: He is alert.  Psychiatric:        Mood and Affect: Mood normal.        Behavior: Behavior normal.     ED Results and Treatments Labs (all labs ordered are listed, but only abnormal results are displayed) Labs Reviewed  BRAIN NATRIURETIC PEPTIDE - Abnormal; Notable for the following components:      Result Value   B Natriuretic Peptide 619.9 (*)    All other components within normal limits  COMPREHENSIVE METABOLIC PANEL - Abnormal; Notable for the following components:   CO2 21 (*)    Glucose, Bld 109 (*)    BUN 27 (*)    Calcium  8.5 (*)    Total Protein 6.2 (*)    Albumin  3.4 (*)    ALT 46 (*)    All other components within normal limits  URINALYSIS, ROUTINE W REFLEX MICROSCOPIC - Abnormal; Notable for the following components:   Protein, ur 30 (*)    All other components within normal limits  URINALYSIS, MICROSCOPIC (REFLEX) - Abnormal; Notable for the following components:   Bacteria, UA FEW (*)    All other components within normal limits  TROPONIN I (HIGH SENSITIVITY) - Abnormal; Notable for the following components:   Troponin I (High Sensitivity) 19 (*)    All other components within normal limits  RESP PANEL BY RT-PCR (RSV, FLU A&B, COVID)  RVPGX2  CULTURE, BLOOD (ROUTINE X 2)  CULTURE, BLOOD (ROUTINE X 2)  CBC WITH DIFFERENTIAL/PLATELET  LACTIC ACID, PLASMA  LACTIC ACID, PLASMA  CBC  DIFFERENTIAL  TROPONIN I (HIGH  SENSITIVITY)  TROPONIN I (HIGH SENSITIVITY)                                                                                                                          Radiology DG Chest Port 1 View Result Date: 11/12/2023 CLINICAL DATA:  Cough, chills, and weakness. Currently on chemotherapy for multiple myeloma. EXAM: PORTABLE CHEST 1 VIEW COMPARISON:  PET-CT dated September 26, 2023. Chest x-ray dated August 02, 2023. FINDINGS:  The heart size and mediastinal contours are within normal limits. Prior CABG. Normal pulmonary vascularity. Similar scarring in the left upper lobe. 2.9 cm spiculated nodule in the mid left lung, not seen on the prior x-ray and without clear correlate on recent PET-CT. No focal consolidation, pleural effusion, or pneumothorax. No acute osseous abnormality. Chronic fracture deformity of the left scapula again noted. IMPRESSION: 1. 2.9 cm spiculated nodule in the mid left lung, not seen on the prior x-ray and without clear correlate on recent PET-CT. Differential considerations include infection or malignancy. Recommend further evaluation with chest CT. Electronically Signed   By: Elsie ONEIDA Shoulder M.D.   On: 11/12/2023 12:33    Pertinent labs & imaging results that were available during my care of the patient were reviewed by me and considered in my medical decision making (see MDM for details).  Medications Ordered in ED Medications  sodium chloride  0.9 % bolus 1,000 mL (0 mLs Intravenous Stopped 11/12/23 1431)  acetaminophen  (TYLENOL ) tablet 1,000 mg (1,000 mg Oral Given 11/12/23 1230)  cefTRIAXone  (ROCEPHIN ) 1 g in sodium chloride  0.9 % 100 mL IVPB (0 g Intravenous Stopped 11/12/23 1431)  azithromycin  (ZITHROMAX ) 500 mg in sodium chloride  0.9 % 250 mL IVPB (0 mg Intravenous Stopped 11/12/23 1431)                                                                                                                                     Procedures .Critical Care  Performed by: Elnor Jayson LABOR, DO Authorized by: Elnor Jayson LABOR, DO   Critical care provider statement:    Critical care time (minutes):  42   Critical care time was exclusive of:  Separately billable procedures and treating other patients   Critical care was necessary to treat or prevent imminent or life-threatening deterioration of the following conditions:  Respiratory failure   Critical care was time spent personally by me on the following activities:  Development of treatment plan with patient or surrogate, discussions with consultants, evaluation of patient's response to treatment, examination of patient, ordering and review of laboratory studies, ordering and review of radiographic studies, ordering and performing treatments and interventions, pulse oximetry, re-evaluation of patient's condition, review of old charts and obtaining history from patient or surrogate   (including critical care time)  Medical Decision Making / ED Course    Medical Decision Making:    Lan Mcneill is a 73 y.o. male with past medical history as below, significant for multiple myeloma s/p BMT ([facility]), follows with Dr. Timmy, CABG x 3, radiation therapy, HLD, HTN who presents to the ED with complaint of cough, dyspnea. The complaint involves an extensive differential diagnosis and also carries with it a high risk of complications and morbidity.  Serious etiology was considered. Ddx includes but is not limited to: In my evaluation of this patient's dyspnea my DDx includes, but is not limited to, pneumonia, pulmonary embolism, pneumothorax,  pulmonary edema, metabolic acidosis, asthma, COPD, cardiac cause, anemia, anxiety, etc.    Complete initial physical exam performed, notably the patient was in mild respiratory stress, hypoxia 87% on room air.  Improved to 94% on 3 L nasal cannula.  Coarse breath sounds bilateral..    Reviewed and confirmed nursing documentation for past medical history, family history, social history.  Vital signs  reviewed.      Clinical Course as of 11/12/23 1530  Sun Nov 12, 2023  1256 PSI score elev [SG]  1530 Notified Dr Timmy (oncology) [SG]    Clinical Course User Index [SG] Elnor Jayson LABOR, DO    Brief summary: 73 year old with history as above including multiple myeloma currently receiving therapy, status post BMT at [facility].  Here with cough, dyspnea, URI symptoms.  Hypoxia on room air, no home oxygen use.  Improved to 94% on 3 L nasal cannula.  Check screening labs, get x-ray, continue supplemental oxygen, give IV fluids.  Patient likely require admission given hypoxia  CXR w/ pna, also spiculated mass. Will get CTA chest. Start abx. Continue 3LNC, plan for admission. Handoff to incoming EDP pending remainder of labs and ct/ admission.               Additional history obtained: -Additional history obtained from family -External records from outside source obtained and reviewed including: Chart review including previous notes, labs, imaging, consultation notes including  OSH records, home medications, prior labs and imaging, oncology documentation   Lab Tests: -I ordered, reviewed, and interpreted labs.   The pertinent results include:   Labs Reviewed  BRAIN NATRIURETIC PEPTIDE - Abnormal; Notable for the following components:      Result Value   B Natriuretic Peptide 619.9 (*)    All other components within normal limits  COMPREHENSIVE METABOLIC PANEL - Abnormal; Notable for the following components:   CO2 21 (*)    Glucose, Bld 109 (*)    BUN 27 (*)    Calcium  8.5 (*)    Total Protein 6.2 (*)    Albumin  3.4 (*)    ALT 46 (*)    All other components within normal limits  URINALYSIS, ROUTINE W REFLEX MICROSCOPIC - Abnormal; Notable for the following components:   Protein, ur 30 (*)    All other components within normal limits  URINALYSIS, MICROSCOPIC (REFLEX) - Abnormal; Notable for the following components:   Bacteria, UA FEW (*)    All other components within  normal limits  TROPONIN I (HIGH SENSITIVITY) - Abnormal; Notable for the following components:   Troponin I (High Sensitivity) 19 (*)    All other components within normal limits  RESP PANEL BY RT-PCR (RSV, FLU A&B, COVID)  RVPGX2  CULTURE, BLOOD (ROUTINE X 2)  CULTURE, BLOOD (ROUTINE X 2)  CBC WITH DIFFERENTIAL/PLATELET  LACTIC ACID, PLASMA  LACTIC ACID, PLASMA  CBC  DIFFERENTIAL  TROPONIN I (HIGH SENSITIVITY)  TROPONIN I (HIGH SENSITIVITY)    Notable for BNP+  EKG   EKG Interpretation Date/Time:    Ventricular Rate:    PR Interval:    QRS Duration:    QT Interval:    QTC Calculation:   R Axis:      Text Interpretation:           Imaging Studies ordered: I ordered imaging studies including CXR CTA chest (CTA pending) I independently visualized the following imaging with scope of interpretation limited to determining acute life threatening conditions related to emergency care; findings noted above  I independently visualized and interpreted imaging. I agree with the radiologist interpretation   Medicines ordered and prescription drug management: Meds ordered this encounter  Medications   sodium chloride  0.9 % bolus 1,000 mL   acetaminophen  (TYLENOL ) tablet 1,000 mg   cefTRIAXone  (ROCEPHIN ) 1 g in sodium chloride  0.9 % 100 mL IVPB    Antibiotic Indication::   CAP   azithromycin  (ZITHROMAX ) 500 mg in sodium chloride  0.9 % 250 mL IVPB    Antibiotic Indication::   CAP    -I have reviewed the patients home medicines and have made adjustments as needed   Consultations Obtained: na   Cardiac Monitoring: The patient was maintained on a cardiac monitor.  I personally viewed and interpreted the cardiac monitored which showed an underlying rhythm of: NSR Continuous pulse oximetry interpreted by myself, 96% on 3L.    Social Determinants of Health:  Diagnosis or treatment significantly limited by social determinants of health: current smoker   Reevaluation: After  the interventions noted above, I reevaluated the patient and found that they have improved  Co morbidities that complicate the patient evaluation  Past Medical History:  Diagnosis Date   Atherosclerosis    Coronary artery disease    Goals of care, counseling/discussion 06/29/2020   History of radiation therapy    Left Humerus, T-spine, Pelvis- 09/19/22-10/04/22- Dr. Lynwood Nasuti   History of radiation therapy    Right Femur- 01/15/23-01/16/23- Dr. Lynwood Nasuti   History of radiation therapy    Thoracic spine- 06/06/23-06/19/23- Dr. 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    Final Clinical Impression(s) / ED Diagnoses Final diagnoses:  Community acquired pneumonia, unspecified laterality  Multiple myeloma, remission status unspecified (HCC)  Hypoxia        Elnor Jayson LABOR, DO 11/12/23 1529    Elnor Jayson LABOR, DO 11/12/23 1530

## 2023-11-12 NOTE — Progress Notes (Signed)
 Prior-To-Admission Oral Chemotherapy for Treatment of Oncologic Disease   Order noted from Dr. Garrick to continue prior-to-admission oral chemotherapy regimen of pomalidomide .  Procedure Per Pharmacy & Therapeutics Committee Policy: Orders for continuation of home oral chemotherapy for treatment of an oncologic disease will be held unless approved by an oncologist during current admission.    For patients receiving oncology care at [facility], inpatient pharmacist contacts patient's oncologist during regular office hours to review. If earlier review is medically necessary, attending physician consults [facility] on-call oncologist   For patients receiving oncology care outside of [facility], attending physician consults patient's oncologist to review. If this oncologist or their coverage cannot be reached, attending physician consults [id number] on-call oncologist   Oral chemotherapy continuation order is on hold pending oncologist review, [facility] oncologist Enever will be notified by inpatient pharmacy during office hours     Leeroy Mace RPh 11/12/2023, 10:28 PM

## 2023-11-12 NOTE — ED Notes (Signed)
 Patient transported to CT

## 2023-11-12 NOTE — ED Triage Notes (Signed)
 Per EMS:  Pt having runny nose, cough, dizziness for 2 days.  No known sick exposures.  Pt is a cancer patient of dr. Craig.  Upon arrival, pt appears pale, having dizziness.  Pulse ox 91% on RA.  Pt states he thinks he has a cold.  Productive cough, green sputum.  Chills, no known fever.  Pt having generalized weakness, and dizziness.  Pt c/o dizziness.  Pt does currently take chemo for multiple myeloma.  Pt had radiation last year for same.  Pulse ox repeated on RA 89%.  Pt does have cough, rhonchi noted with lung sounds.

## 2023-11-12 NOTE — Progress Notes (Signed)
 Pharmacy Antibiotic Note  Bruce Smith is a 73 y.o. male admitted on 11/12/2023 with cough. Pharmacy has been consulted for Cefepime  dosing for febrile neutropenia/pneumonia.  Plan: Cefepime  2g IV q8h Monitor renal function, cultures, clinical course  Height: 5' 8 (172.7 cm) Weight: 94.5 kg (208 lb 5.4 oz) IBW/kg (Calculated) : 68.4  Temp (24hrs), Avg:99.4 F (37.4 C), Min:98.8 F (37.1 C), Max:99.9 F (37.7 C)  Recent Labs  Lab 11/12/23 1228 11/12/23 1423  WBC SPECIMEN CLOTTED NOTIFIED SOPHIE GOUGE RN 1350 11/12/23 CMR 1.2*  CREATININE 1.12  --   LATICACIDVEN 1.9  --     Estimated Creatinine Clearance: 66.4 mL/min (by C-G formula based on SCr of 1.12 mg/dL).    No Known Allergies  Antimicrobials this admission: 1/12 Ceftriaxone  x 1 1/12 Azithromycin  >> 1/12 Cefepime  >>  Microbiology results: 1/12 BCx:  1/12 COVID, Influenza, RSV negative  Thank you for allowing pharmacy to be a part of this patient's care.   Lamekia Nolden, PharmD, BCPS Clinical Pharmacist 11/12/2023 9:09 PM

## 2023-11-13 DIAGNOSIS — Z9481 Bone marrow transplant status: Secondary | ICD-10-CM | POA: Diagnosis not present

## 2023-11-13 DIAGNOSIS — M549 Dorsalgia, unspecified: Secondary | ICD-10-CM | POA: Diagnosis not present

## 2023-11-13 DIAGNOSIS — Z8579 Personal history of other malignant neoplasms of lymphoid, hematopoietic and related tissues: Secondary | ICD-10-CM

## 2023-11-13 DIAGNOSIS — E860 Dehydration: Secondary | ICD-10-CM | POA: Diagnosis not present

## 2023-11-13 DIAGNOSIS — N19 Unspecified kidney failure: Secondary | ICD-10-CM | POA: Diagnosis not present

## 2023-11-13 DIAGNOSIS — J189 Pneumonia, unspecified organism: Secondary | ICD-10-CM | POA: Diagnosis not present

## 2023-11-13 DIAGNOSIS — C9002 Multiple myeloma in relapse: Secondary | ICD-10-CM | POA: Diagnosis not present

## 2023-11-13 LAB — PATHOLOGIST SMEAR REVIEW

## 2023-11-13 LAB — TSH: TSH: 1.182 u[IU]/mL (ref 0.350–4.500)

## 2023-11-13 LAB — COMPREHENSIVE METABOLIC PANEL
ALT: 32 U/L (ref 0–44)
AST: 17 U/L (ref 15–41)
Albumin: 2.8 g/dL — ABNORMAL LOW (ref 3.5–5.0)
Alkaline Phosphatase: 55 U/L (ref 38–126)
Anion gap: 6 (ref 5–15)
BUN: 32 mg/dL — ABNORMAL HIGH (ref 8–23)
CO2: 24 mmol/L (ref 22–32)
Calcium: 8 mg/dL — ABNORMAL LOW (ref 8.9–10.3)
Chloride: 109 mmol/L (ref 98–111)
Creatinine, Ser: 1.16 mg/dL (ref 0.61–1.24)
GFR, Estimated: >60 mL/min (ref >60)
Glucose, Bld: 98 mg/dL (ref 70–99)
Potassium: 5 mmol/L (ref 3.5–5.1)
Sodium: 139 mmol/L (ref 135–145)
Total Bilirubin: 0.5 mg/dL (ref 0.0–1.2)
Total Protein: 5.3 g/dL — ABNORMAL LOW (ref 6.5–8.1)

## 2023-11-13 LAB — CBC WITH DIFFERENTIAL/PLATELET
Abs Immature Granulocytes: 0.01 10*3/uL (ref 0.00–0.07)
Basophils Absolute: 0 10*3/uL (ref 0.0–0.1)
Basophils Relative: 1 %
Eosinophils Absolute: 0 10*3/uL (ref 0.0–0.5)
Eosinophils Relative: 2 %
HCT: 31.8 % — ABNORMAL LOW (ref 39.0–52.0)
Hemoglobin: 10.4 g/dL — ABNORMAL LOW (ref 13.0–17.0)
Immature Granulocytes: 1 %
Lymphocytes Relative: 22 %
Lymphs Abs: 0.4 10*3/uL — ABNORMAL LOW (ref 0.7–4.0)
MCH: 31 pg (ref 26.0–34.0)
MCHC: 32.7 g/dL (ref 30.0–36.0)
MCV: 94.9 fL (ref 80.0–100.0)
Monocytes Absolute: 0.5 10*3/uL (ref 0.1–1.0)
Monocytes Relative: 27 %
Neutro Abs: 0.9 10*3/uL — ABNORMAL LOW (ref 1.7–7.7)
Neutrophils Relative %: 47 %
Platelets: 65 10*3/uL — ABNORMAL LOW (ref 150–400)
RBC: 3.35 MIL/uL — ABNORMAL LOW (ref 4.22–5.81)
RDW: 14.6 % (ref 11.5–15.5)
WBC: 1.9 10*3/uL — ABNORMAL LOW (ref 4.0–10.5)
nRBC: 0 % (ref 0.0–0.2)

## 2023-11-13 LAB — VITAMIN B12: Vitamin B-12: 346 pg/mL (ref 180–914)

## 2023-11-13 LAB — PROCALCITONIN: Procalcitonin: 23.26 ng/mL

## 2023-11-13 LAB — PROTIME-INR
INR: 1.3 — ABNORMAL HIGH (ref 0.8–1.2)
Prothrombin Time: 16.5 s — ABNORMAL HIGH (ref 11.4–15.2)

## 2023-11-13 LAB — PHOSPHORUS: Phosphorus: 3.6 mg/dL (ref 2.5–4.6)

## 2023-11-13 LAB — APTT: aPTT: 35 s (ref 24–36)

## 2023-11-13 LAB — MAGNESIUM: Magnesium: 1.8 mg/dL (ref 1.7–2.4)

## 2023-11-13 MED ORDER — GUAIFENESIN ER 600 MG PO TB12
1200.0000 mg | ORAL_TABLET | Freq: Two times a day (BID) | ORAL | Status: DC
  Administered 2023-11-14 – 2023-11-16 (×5): 1200 mg via ORAL
  Filled 2023-11-13 (×5): qty 2

## 2023-11-13 MED ORDER — IPRATROPIUM BROMIDE 0.02 % IN SOLN
0.5000 mg | Freq: Four times a day (QID) | RESPIRATORY_TRACT | Status: DC
  Administered 2023-11-14: 0.5 mg via RESPIRATORY_TRACT
  Filled 2023-11-13: qty 2.5

## 2023-11-13 MED ORDER — FILGRASTIM-AAFI 480 MCG/0.8ML IJ SOSY
480.0000 ug | PREFILLED_SYRINGE | Freq: Every day | INTRAMUSCULAR | Status: DC
  Administered 2023-11-13 – 2023-11-14 (×2): 480 ug via SUBCUTANEOUS
  Filled 2023-11-13 (×3): qty 0.8

## 2023-11-13 MED ORDER — IMMUNE GLOBULIN (HUMAN) 10 GM/100ML IV SOLN
40.0000 g | INTRAVENOUS | Status: AC
Start: 2023-11-13 — End: 2023-11-13
  Administered 2023-11-13: 40 g via INTRAVENOUS
  Filled 2023-11-13: qty 400

## 2023-11-13 MED ORDER — LEVALBUTEROL HCL 0.63 MG/3ML IN NEBU
0.6300 mg | INHALATION_SOLUTION | Freq: Four times a day (QID) | RESPIRATORY_TRACT | Status: DC
  Administered 2023-11-14: 0.63 mg via RESPIRATORY_TRACT
  Filled 2023-11-13: qty 3

## 2023-11-13 NOTE — Consult Note (Signed)
 Bruce Smith is well-known to me.  He is a very nice 73 year old white male.  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  He had done well until recently when he had a relapse.  He had been on maintenance treatment with Faspro.  His relapses have typically been in the bones.  He has had radiotherapy.  Currently, he is on Kyprolis  and pomalidomide .  He is doing well with this.  Starting this past Thursday, he began to have some difficulties.  He began to not feel well.  He was not eating well.  He had a more of a cough.  He had a temperature.  He then began to decline more quickly.  He ended up in the ER.  He was subsequently admitted.  He had a CT angiogram of the chest on 11/12/2023.  There was no pulmonary embolism.  However, looks like he had pneumonia in the right lower lobe and possibly left lower lobe.  Also noted was a lytic lesion T4.  He had lab work done which showed a white cell count 1.2.  Hemoglobin 11.5.  Platelet count 63,000.  This morning, his white count is 1.9.  Hemoglobin 10.4.  Platelet count 65,000.  His electrolytes that were done yesterday shows sodium 136.  Potassium 3.5.  BUN 27 creatinine 1.12.  Calcium  8.5 with an albumin  of 3.4.  He did have an elevated beta nitric peptide of 620.  Troponin was 19.  He has had a myocardial infarction.  I think he had bypass surgery last year.  The back in December, he was having problems with bronchitis.  We did give him a dose of IVIG.  I suspect he probably will need another dose of IVIG.  He has had a temperature.  Cultures are pending.  He is bringing up some purulent sputum.  I think this has been cultured.  Currently, he is on Maxipime  and azithromycin .  He has had no bleeding.  He has had some bruising.  He has had no diarrhea.  He has had a poor appetite.  He has had some nausea but no vomiting.  He has not had any recent traveling.  His wife also has a  cough.   On physical exam, temperature 98.5.  Pulse 58.  Blood pressure 116/50.  Oxygen saturation is 97%.  His head and neck exam shows no ocular or oral lesions.  There are no palpable cervical or supraclavicular lymph nodes.  Lungs are relatively clear bilaterally.  I do not hear any wheezing.  He has decent air movement.  Cardiac exam regular rate and rhythm.  Abdomen is soft.  There are no guarding or rebound tenderness.  He has good bowel sounds.  He has no palpable liver or spleen tip.  Extremities shows no clubbing, cyanosis or edema.  He has good range of motion of his joints.  Skin exam shows some scattered ecchymoses.  Neurological exam is nonfocal.   Mr. Eppinger has myeloma last relapse.  He is on Kyprolis  and pomalidomide .  We are trying to get him to CAR-T therapy.  He has been seen at [facility].  He now has pneumonia.  Again, we will give him a dose of Neupogen  since he is neutropenic.  I am sure that the neutropenia is probably secondary to his pomalidomide .  I will also give a dose of IVIG.  We will see what cultures have to show.  For  right now, he is on good antibiotic regimen.  We will see what his labs look like.  I know that he will get wonderful care from all the staff up on [address].   Jeralyn Crease, MD  Psalm 118:14

## 2023-11-13 NOTE — Evaluation (Deleted)
 OT Cancellation Note  Patient Details Name: Bruce Smith MRN: [id number] DOB: Jan 20, 1951   Cancelled Treatment:    Reason Eval/Treat Not Completed: Medical issues which prohibited therapy. Pt with noted INR at 1.3, will re-attempt when appropriate.   Jearldean Gutt L. Jodie Leiner, OTR/L

## 2023-11-13 NOTE — TOC Initial Note (Signed)
 Transition of Care [facility]) - Initial/Assessment Note    Patient Details  Name: Bruce Smith MRN: [id number] Date of Birth: 01/22/1951  Transition of Care [facility]) CM/SW Contact:    Bascom Service, RN Phone Number: 11/13/2023, 3:55 PM  Clinical Narrative: d/c plan home.                  Expected Discharge Plan: Home/Self Care Barriers to Discharge: Continued Medical Work up   Patient Goals and CMS Choice Patient states their goals for this hospitalization and ongoing recovery are:: Home          Expected Discharge Plan and Services                                              Prior Living Arrangements/Services                       Activities of Daily Living   ADL Screening (condition at time of admission) Independently performs ADLs?: Yes (appropriate for developmental age) Is the patient deaf or have difficulty hearing?: No Does the patient have difficulty seeing, even when wearing glasses/contacts?: No Does the patient have difficulty concentrating, remembering, or making decisions?: No  Permission Sought/Granted                  Emotional Assessment              Admission diagnosis:  Hypoxia [R09.02] Sepsis due to pneumonia (HCC) [J18.9, A41.9] Multiple myeloma, remission status unspecified (HCC) [C90.00] Community acquired pneumonia, unspecified laterality [J18.9] Patient Active Problem List   Diagnosis Date Noted   CAP (community acquired pneumonia) 11/12/2023   Sepsis (HCC) 11/12/2023   Pancytopenia (HCC) 11/12/2023   Neutropenia (HCC) 11/12/2023   Acute on chronic back pain 11/12/2023   Elevated troponin 11/12/2023   Dehydration 11/12/2023   Acute prerenal azotemia 11/12/2023   History of multiple myeloma 11/12/2023   HLD (hyperlipidemia) 11/12/2023   GERD (gastroesophageal reflux disease) 11/12/2023   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   Coronary artery disease 03/13/2023   NSTEMI (non-ST  elevated myocardial infarction) (HCC) 03/09/2023   Hypomagnesemia 03/23/2021   Hypocalcemia 03/23/2021   Physical debility 03/10/2021   Generalized weakness    Enterococcus UTI    Pain in both feet    Malnutrition of moderate degree 03/05/2021   FTT (failure to thrive) in adult 02/23/2021   AKI (acute kidney injury) (HCC) 10/29/2020   Abnormal neurological exam 10/29/2020   Essential hypertension 10/29/2020   CAD (coronary artery disease) 10/29/2020   Intractable pain 10/26/2020   Multiple myeloma (HCC) 07/21/2020   Hypercalcemia of malignancy 06/30/2020   Goals of care, counseling/discussion 06/29/2020   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, [place] - [address] [place] [address] Phone: [phone] Fax: [phone]  [place] [facility] [address] [place] [address] Phone: [phone] Fax: [phone]  [facility] [place], [place] - [address] [facility] [address] [place] [address] Phone: [phone] Fax: [phone]     Social Drivers of Health (SDOH) Social History: SDOH Screenings   Food Insecurity: No Food Insecurity (  11/12/2023)  Housing: Low Risk  (11/12/2023)  Transportation Needs: No Transportation Needs (11/13/2023)  Utilities: Not At Risk (11/13/2023)  Financial Resource Strain: Low Risk  (03/02/2023)   Received from [facility]  Social Connections: Moderately Integrated (11/13/2023)  Tobacco Use: High Risk (11/12/2023)   SDOH Interventions:     Readmission Risk Interventions     No data to display

## 2023-11-13 NOTE — Hospital Course (Addendum)
 The patient is a 73 year old M with PMH of MM s/p BMT [facility]), currently on chemotherapy and followed by Dr. Timmy, CAD/CABG, HTN and HLD presented to med [facility] with runny nose, productive cough, dizziness, chills, generalized weakness and feeling unwell.  Vitals in ED significant for tachypnea and mild temp to 99.9.  No documented desaturation but started on 2 L.  CMP without significant finding.  CBC with leukopenia/neutropenia and thrombocytopenia.  BNP 619.  Troponin 19 x 2.  Lactic acid 1.9.  UA without significant finding.  CT angio chest negative for PE but concerning for multifocal pneumonia and new lytic lesion in T4 vertebral body consistent with progression of MM.  Blood cultures obtained.  Received 1 L bolus.  Started on ceftriaxone  and Zithromax .  Admission requested for multifocal pneumonia and Sepsis.   Medical Oncology consulted for further evaluation.  Assessment and Plan:  Sepsis 2/2 to Multifocal Community Acquired PNA, improving  -Had 2-3 Days of new Onset Productive Cough, Subjective Fever, Chills -CTA Chest done and showed No evidence of pulmonary embolism. New patchy irregular ground-glass densities primarily in the left lower lobe, with additional small, more dense consolidative opacity in the medial right lower lobe, consistent with multifocal pneumonia. This accounts for the density seen on chest x-ray. New lytic lesion in the T4 vertebral body, consistent with progression of multiple myeloma.  Aortic Atherosclerosis -PCT was 23.26 and will repeat PCT in the AM  -Continue Abx with Cefepime  and Azithromycin  -Also got IVIG -C/w Guaifenesin  1200 mg po BID -Check Sputum Cx -C/w Xopenex  and Atrovent  scheduled; Add Brovana  and Budesonide   -IVF now stopped -Check Mycoplasma Ab and Strep Urine Ag -SpO2: 95 % O2 Flow Rate (L/min): (S) 2 L/min (down to 1 lpm- rn aware) -Continuous Pulse Oximetry and Maintain O2 Sats >90%  -Continue Supplemental O2 [address] [place] and Wean O2  as Tolerated -Repeat CXR this AM done and showed Prior CABG. Heart and mediastinal contours within normal limits. Patchy left upper lobe airspace opacity is increased since prior study. No confluent opacity on the right. No effusions or acute bony abnormality.  Generalized Weakness -Has had Generalized Weakness  -IVF now stopped -PT/OT to evaluate and Treat; Deferred PT evaluation today  -Checked TSH and was 1.182  Acute on Chronic Thoracic Back Discomfort -Has Lytic Bone Lesions as a Consequence of MM -C/w Home PRN Tramadol  PRN and Acetaminophen  -C/w IV Dilaudid  for Severe Pain -C/w Lidocaine  Patch -Med Onc consulted and appreciate Dr. Jessy evaluation  Elevated Troponin -Troponin Level was 19 x2 and Flat -No CP -Continue to Monitor and Trend  Dehydration -IVF now stopped -Continue to Monitor  Metabolic Acidosis -Improved initially and now has another. CO2 is now 19, AG of 6, Chloride Level of 109 -Continue to Monitor and trend and repeat CMP in the AM   Pancytopenia -CBC Trend: Recent Labs  Lab 10/19/23 1016 10/26/23 1014 11/02/23 1014 11/02/23 1014 11/12/23 1228 11/12/23 1423 11/13/23 0456 11/14/23 0458  WBC 5.4 4.2 4.8  --  SPECIMEN CLOTTED NOTIFIED SOPHIE GOUGE RN 1350 11/12/23 CMR 1.2* 1.9* 3.0*  HGB 12.1* 12.0* 11.7*   < > SPECIMEN CLOTTED NOTIFIED SOPHIE GOUGE RN 1350 11/12/23 CMR 11.5* 10.4* 10.3*  HCT 36.9* 36.3* 35.8*  --  SPECIMEN CLOTTED NOTIFIED SOPHIE GOUGE RN 1350 11/12/23 CMR 35.4* 31.8* 33.1*  MCV 93.2 93.3 93.7  --  SPECIMEN CLOTTED NOTIFIED SOPHIE GOUGE RN 1350 11/12/23 CMR 93.2 94.9 98.2  PLT 101* 72* 41*   < > SPECIMEN CLOTTED  NOTIFIED SOPHIE GOUGE RN 1350 11/12/23 CMR 63* 65* 75*   < > = values in this interval not displayed.  -Check Anemia Panel in the AM -Getting Filgrastim -aafi 480 mcg sq Daily  -Continue to Monitor for S/Sx of bleeding; No overt bleeding noted -Repeat CBC in the AM  GERD/GI Prophylaxis -Continue PPI with  Pantoprazole  40 mg po BID  Multiple Myeloma -Dr. Jacquelene Consulted -Currently on Kyprolis  and Pomalidomide  in the outpatient setting  -Received a dose of IVIG  Hypoalbuminemia -Patient's Albumin  Trend: Recent Labs  Lab 10/19/23 1016 10/26/23 1014 11/02/23 1014 11/12/23 1228 11/13/23 0456 11/14/23 0458  ALBUMIN  4.1 3.9 3.8 3.4* 2.8* 2.6*  -Continue to Monitor and Trend and repeat CMP in the AM  Class I Obesity -Complicates overall prognosis and care -Estimated body mass index is 31.68 kg/m as calculated from the following:   Height as of this encounter: 5' 8 (1.727 m).   Weight as of this encounter: 94.5 kg.  -Weight Loss and Dietary Counseling given

## 2023-11-13 NOTE — Progress Notes (Signed)
 Shift changed at 0300. Agree with previous RN's documentation of this patient's current assessment.

## 2023-11-13 NOTE — Progress Notes (Signed)
 OT Cancellation Note  Patient Details Name: Bruce Smith MRN: [id number] DOB: Jan 20, 1951   Cancelled Treatment:    Reason Eval/Treat Not Completed: Medical issues which prohibited therapy. Pt with noted INR at 1.3, will re-attempt when appropriate.   Jearldean Gutt L. Jodie Leiner, OTR/L

## 2023-11-13 NOTE — Progress Notes (Signed)
 PT Cancellation Note  Patient Details Name: Bruce Smith MRN: [id number] DOB: Jan 09, 1951   Cancelled Treatment:    Reason Eval/Treat Not Completed: Patient declined, no reason specified. Pt requested for PT to check back on tomorrow. Will check back as schedule allows.    Dannial SQUIBB, PT Acute Rehabilitation  Office: [phone]

## 2023-11-13 NOTE — Progress Notes (Signed)
 PROGRESS NOTE    Bruce Smith  FMW:[id number] DOB: 23-Jan-1951 DOA: 11/12/2023 PCP: Lelon Norleen ORN., MD   Brief Narrative:  The patient is a 73 year old M with PMH of MM s/p BMT [facility]), currently on chemotherapy and followed by Dr. Timmy, CAD/CABG, HTN and HLD presented to med [facility] with runny nose, productive cough, dizziness, chills, generalized weakness and feeling unwell.  Vitals in ED significant for tachypnea and mild temp to 99.9.  No documented desaturation but started on 2 L.  CMP without significant finding.  CBC with leukopenia/neutropenia and thrombocytopenia.  BNP 619.  Troponin 19 x 2.  Lactic acid 1.9.  UA without significant finding.  CT angio chest negative for PE but concerning for multifocal pneumonia and new lytic lesion in T4 vertebral body consistent with progression of MM.  Blood cultures obtained.  Received 1 L bolus.  Started on ceftriaxone  and Zithromax .  Admission requested for multifocal pneumonia and Sepsis.   Medical Oncology consulted for further evaluation.  Assessment and Plan:  Sepsis 2/2 to Multifocal Community Acquired PNA -Had 2-3 Days of new Onset Productive Cough, Subjective Fever, Chills -CTA Chest done and showed No evidence of pulmonary embolism. New patchy irregular ground-glass densities primarily in the left lower lobe, with additional small, more dense consolidative opacity in the medial right lower lobe, consistent with multifocal pneumonia. This accounts for the density seen on chest x-ray. New lytic lesion in the T4 vertebral body, consistent with progression of multiple myeloma.  Aortic Atherosclerosis -PCT was 23.26 -Continue Abx with Cefepime  and Azithromycin  -C/w Guaifenesin  1200 mg po BID -Add Xopenex  and Atrovent  scheduled -IVF now stopped -Check Mycoplasma Ab and Strep Urine Ag -SpO2: 93 % O2 Flow Rate (L/min): 2 L/min -Continuous Pulse Oximetry and Maintain O2 Sats >90%  -Continue Supplemental O2 Via [place] and Wean O2 as  Tolerated -Repeat CXR in the AM   Generalized Weakness -Has had Generalized Weakness  -IVF now stopped -PT/OT to evaluate and Treat -Checked TSH and was 1.182  Acute on Chronic Thoracic Back Discomfort -Has Lytic Bone Lesions as a Consequence of MM -C/w Home PRN Tramadol  PRN and Acetaminophen  -C/w IV Dilaudid  for Severe Pain -C/w Lidocaine  Patch -Med Onc consulted and appreciate Dr. Jessy evaluation  Elevated Troponin -Troponin Level was 19 -No CP -Continue to Monitor and Trend  Dehydration -IVF now stopped -Continue to Monitor  Metabolic Acidosis -Improved. CO2 is now 24, AG of 6, Chloride Level of 109 -Continue to Monitor and trend and repeat CMP in the AM   Pancytopenia -CBC Trend: Recent Labs  Lab 10/19/23 1016 10/26/23 1014 11/02/23 1014 11/02/23 1014 11/12/23 1228 11/12/23 1423 11/13/23 0456  WBC 5.4 4.2 4.8  --  SPECIMEN CLOTTED NOTIFIED SOPHIE GOUGE RN 1350 11/12/23 CMR 1.2* 1.9*  HGB 12.1* 12.0* 11.7*   < > SPECIMEN CLOTTED NOTIFIED SOPHIE GOUGE RN 1350 11/12/23 CMR 11.5* 10.4*  HCT 36.9* 36.3* 35.8*  --  SPECIMEN CLOTTED NOTIFIED SOPHIE GOUGE RN 1350 11/12/23 CMR 35.4* 31.8*  MCV 93.2 93.3 93.7  --  SPECIMEN CLOTTED NOTIFIED SOPHIE GOUGE RN 1350 11/12/23 CMR 93.2 94.9  PLT 101* 72* 41*   < > SPECIMEN CLOTTED NOTIFIED SOPHIE GOUGE RN 1350 11/12/23 CMR 63* 65*   < > = values in this interval not displayed.  -Check Anemia Panel in the AM -Getting Filgrastim -aafi 480 mcg sq Daily  -Continue to Monitor for S/Sx of bleeding; No overt bleeding noted -Repeat CBC in the AM  GERD/GI Prophylaxis -Continue PPI with Pantoprazole   40 mg po BID  Multiple Myeloma -Dr. Jacquelene Consulted -Currently on Kyprolis  Pomalidomide  -Getting a dose of IVIG  Hypoalbuminemia -Patient's Albumin  Trend: Recent Labs  Lab 10/19/23 1016 10/26/23 1014 11/02/23 1014 11/12/23 1228 11/13/23 0456  ALBUMIN  4.1 3.9 3.8 3.4* 2.8*  -Continue to Monitor and Trend and repeat CMP in  the AM  Obesity -Complicates overall prognosis and care -Estimated body mass index is 31.68 kg/m as calculated from the following:   Height as of this encounter: 5' 8 (1.727 m).   Weight as of this encounter: 94.5 kg.  -Weight Loss and Dietary Counseling given   DVT prophylaxis: SCDs Start: 11/12/23 1925    Code Status: Full Code Family Communication: Discussed with Wife At bedside  Disposition Plan:  Level of care: Progressive Status is: Inpatient Remains inpatient appropriate because: Needs further current improvement   Consultants:  Medical Oncology  Procedures:  As delineated as above  Antimicrobials:  Anti-infectives (From admission, onward)    Start     Dose/Rate Route Frequency Ordered Stop   11/13/23 1000  azithromycin  (ZITHROMAX ) 500 mg in sodium chloride  0.9 % 250 mL IVPB        500 mg 250 mL/hr over 60 Minutes Intravenous Every 24 hours 11/12/23 1926     11/13/23 1000  cefTRIAXone  (ROCEPHIN ) 1 g in sodium chloride  0.9 % 100 mL IVPB  Status:  Discontinued        1 g 200 mL/hr over 30 Minutes Intravenous Every 24 hours 11/12/23 1926 11/12/23 2020   11/13/23 1000  famciclovir  (FAMVIR ) tablet 250 mg        250 mg Oral Daily 11/12/23 2030     11/12/23 2200  ceFEPIme  (MAXIPIME ) 2 g in sodium chloride  0.9 % 100 mL IVPB        2 g 200 mL/hr over 30 Minutes Intravenous Every 8 hours 11/12/23 2108     11/12/23 1300  cefTRIAXone  (ROCEPHIN ) 1 g in sodium chloride  0.9 % 100 mL IVPB        1 g 200 mL/hr over 30 Minutes Intravenous  Once 11/12/23 1254 11/12/23 1431   11/12/23 1300  azithromycin  (ZITHROMAX ) 500 mg in sodium chloride  0.9 % 250 mL IVPB        500 mg 250 mL/hr over 60 Minutes Intravenous  Once 11/12/23 1254 11/12/23 1431       Subjective: Seen and examined at bedside and continues to cough up some sputum.  No nausea or vomiting.  Still feels weak and tired.  Has a productive cough.  No other concerns or complaints time.  Objective: Vitals:   11/13/23  1039 11/13/23 1320 11/13/23 1615 11/13/23 1936  BP: (!) 120/49 (!) 113/45  (!) 109/46  Pulse: 62 61  61  Resp:  16  19  Temp:  98.3 F (36.8 C)  98.1 F (36.7 C)  TempSrc:  Oral  Oral  SpO2: 98% 93% 93% 93%  Weight:      Height:        Intake/Output Summary (Last 24 hours) at 11/13/2023 2151 Last data filed at 11/13/2023 1804 Gross per 24 hour  Intake 1068.04 ml  Output --  Net 1068.04 ml   Filed Weights   11/12/23 1200  Weight: 94.5 kg   Examination: Physical Exam:  Constitutional: WN/WD obese Caucasian male in NAD Respiratory: Diminished to auscultation bilaterally with coarse breath sounds with some rhonchi but no rales, wheezing, or crackles. Normal respiratory effort and patient is not tachypenic. No accessory muscle use.  Unlabored breathing  Cardiovascular: RRR, no murmurs / rubs / gallops. S1 and S2 auscultated. No extremity edema. Abdomen: Soft, non-tender, Distended 2/2 body habitus. Bowel sounds positive.  GU: Deferred. Musculoskeletal: No clubbing / cyanosis of digits/nails. No joint deformity upper and lower extremities. Skin: No rashes, lesions, ulcers. No induration; Warm and dry.  Neurologic: CN 2-12 grossly intact with no focal deficits. Romberg sign and cerebellar reflexes not assessed.  Psychiatric: Normal judgment and insight. Alert and oriented x 3. Normal mood and appropriate affect.   Data Reviewed: I have personally reviewed following labs and imaging studies  CBC: Recent Labs  Lab 11/12/23 1228 11/12/23 1423 11/13/23 0456  WBC SPECIMEN CLOTTED NOTIFIED SOPHIE GOUGE RN 1350 11/12/23 CMR 1.2* 1.9*  NEUTROABS PENDING 0.4* 0.9*  HGB SPECIMEN CLOTTED NOTIFIED SOPHIE GOUGE RN 1350 11/12/23 CMR 11.5* 10.4*  HCT SPECIMEN CLOTTED NOTIFIED SOPHIE GOUGE RN 1350 11/12/23 CMR 35.4* 31.8*  MCV SPECIMEN CLOTTED NOTIFIED SOPHIE GOUGE RN 1350 11/12/23 CMR 93.2 94.9  PLT SPECIMEN CLOTTED NOTIFIED SOPHIE GOUGE RN 1350 11/12/23 CMR 63* 65*   Basic Metabolic  Panel: Recent Labs  Lab 11/12/23 1228 11/13/23 0456  NA 136 139  K 3.5 5.0  CL 107 109  CO2 21* 24  GLUCOSE 109* 98  BUN 27* 32*  CREATININE 1.12 1.16  CALCIUM  8.5* 8.0*  MG  --  1.8  PHOS  --  3.6   GFR: Estimated Creatinine Clearance: 64.2 mL/min (by C-G formula based on SCr of 1.16 mg/dL). Liver Function Tests: Recent Labs  Lab 11/12/23 1228 11/13/23 0456  AST 27 17  ALT 46* 32  ALKPHOS 80 55  BILITOT 0.6 0.5  PROT 6.2* 5.3*  ALBUMIN  3.4* 2.8*   No results for input(s): LIPASE, AMYLASE in the last 168 hours. No results for input(s): AMMONIA in the last 168 hours. Coagulation Profile: Recent Labs  Lab 11/13/23 0456  INR 1.3*   Cardiac Enzymes: No results for input(s): CKTOTAL, CKMB, CKMBINDEX, TROPONINI in the last 168 hours. BNP (last 3 results) No results for input(s): PROBNP in the last 8760 hours. HbA1C: No results for input(s): HGBA1C in the last 72 hours. CBG: No results for input(s): GLUCAP in the last 168 hours. Lipid Profile: No results for input(s): CHOL, HDL, LDLCALC, TRIG, CHOLHDL, LDLDIRECT in the last 72 hours. Thyroid  Function Tests: Recent Labs    11/13/23 0456  TSH 1.182   Anemia Panel: Recent Labs    11/13/23 0456  VITAMINB12 346   Sepsis Labs: Recent Labs  Lab 11/12/23 1228 11/13/23 0456  PROCALCITON  --  23.26  LATICACIDVEN 1.9  --    Recent Results (from the past 240 hours)  Resp panel by RT-PCR (RSV, Flu A&B, Covid) Anterior Nasal Swab     Status: None   Collection Time: 11/12/23 12:03 PM   Specimen: Anterior Nasal Swab  Result Value Ref Range Status   SARS Coronavirus 2 by RT PCR NEGATIVE NEGATIVE Final    Comment: (NOTE) SARS-CoV-2 target nucleic acids are NOT DETECTED.  The SARS-CoV-2 RNA is generally detectable in upper respiratory specimens during the acute phase of infection. The lowest concentration of SARS-CoV-2 viral copies this assay can detect is 138 copies/mL. A negative  result does not preclude SARS-Cov-2 infection and should not be used as the sole basis for treatment or other patient management decisions. A negative result may occur with  improper specimen collection/handling, submission of specimen other than nasopharyngeal swab, presence of viral mutation(s) within the areas targeted by this  assay, and inadequate number of viral copies(<138 copies/mL). A negative result must be combined with clinical observations, patient history, and epidemiological information. The expected result is Negative.  Fact Sheet for Patients:  bloggercourse.com  Fact Sheet for Healthcare Providers:  seriousbroker.it  This test is no t yet approved or cleared by the [place]  FDA and  has been authorized for detection and/or diagnosis of SARS-CoV-2 by FDA under an Emergency Use Authorization (EUA). This EUA will remain  in effect (meaning this test can be used) for the duration of the COVID-19 declaration under Section 564(b)(1) of the Act, 21 U.S.C.section 360bbb-3(b)(1), unless the authorization is terminated  or revoked sooner.       Influenza A by PCR NEGATIVE NEGATIVE Final   Influenza B by PCR NEGATIVE NEGATIVE Final    Comment: (NOTE) The Xpert Xpress SARS-CoV-2/FLU/RSV plus assay is intended as an aid in the diagnosis of influenza from Nasopharyngeal swab specimens and should not be used as a sole basis for treatment. Nasal washings and aspirates are unacceptable for Xpert Xpress SARS-CoV-2/FLU/RSV testing.  Fact Sheet for Patients: bloggercourse.com  Fact Sheet for Healthcare Providers: seriousbroker.it  This test is not yet approved or cleared by the [place]  FDA and has been authorized for detection and/or diagnosis of SARS-CoV-2 by FDA under an Emergency Use Authorization (EUA). This EUA will remain in effect (meaning this test can be used)  for the duration of the COVID-19 declaration under Section 564(b)(1) of the Act, 21 U.S.C. section 360bbb-3(b)(1), unless the authorization is terminated or revoked.     Resp Syncytial Virus by PCR NEGATIVE NEGATIVE Final    Comment: (NOTE) Fact Sheet for Patients: bloggercourse.com  Fact Sheet for Healthcare Providers: seriousbroker.it  This test is not yet approved or cleared by the [place]  FDA and has been authorized for detection and/or diagnosis of SARS-CoV-2 by FDA under an Emergency Use Authorization (EUA). This EUA will remain in effect (meaning this test can be used) for the duration of the COVID-19 declaration under Section 564(b)(1) of the Act, 21 U.S.C. section 360bbb-3(b)(1), unless the authorization is terminated or revoked.  Performed at [facility], [address] [place], [place] [address]   Blood culture (routine x 2)     Status: None (Preliminary result)   Collection Time: 11/12/23 12:25 PM   Specimen: BLOOD  Result Value Ref Range Status   Specimen Description   Final    BLOOD RIGHT ANTECUBITAL Performed at [facility], [address] [place], [place] [address]    Special Requests   Final    BOTTLES DRAWN AEROBIC AND ANAEROBIC Blood Culture adequate volume Performed at [facility], [address] [place], [place] [address]    Culture   Final    NO GROWTH < 24 HOURS Performed at [facility], [address], [place], [place] [address]    Report Status PENDING  Incomplete  Blood culture (routine x 2)     Status: None (Preliminary result)   Collection Time: 11/12/23  1:20 PM   Specimen: BLOOD RIGHT WRIST  Result Value Ref Range Status   Specimen Description   Final    BLOOD RIGHT WRIST Performed at [facility], [address] [place], [place] [address]    Special Requests   Final    BOTTLES DRAWN AEROBIC AND ANAEROBIC Blood Culture  results may not be optimal due to an inadequate volume of blood received in  culture bottles Performed at [facility] [address], [place], [place] [address]    Culture   Final    NO GROWTH < 24 HOURS Performed at [facility], [address] [place], [place] [address]    Report Status PENDING  Incomplete    Radiology Studies: CT Angio Chest PE W and/or Wo Contrast Result Date: 11/12/2023 CLINICAL DATA:  Cough, chills, and weakness. Currently on chemotherapy for multiple myeloma. Nodule seen on chest x-ray. EXAM: CT ANGIOGRAPHY CHEST WITH CONTRAST TECHNIQUE: Multidetector CT imaging of the chest was performed using the standard protocol during bolus administration of intravenous contrast. Multiplanar CT image reconstructions and MIPs were obtained to evaluate the vascular anatomy. RADIATION DOSE REDUCTION: This exam was performed according to the departmental dose-optimization program which includes automated exposure control, adjustment of the mA and/or kV according to patient size and/or use of iterative reconstruction technique. CONTRAST:  OMNIPAQUE  IOHEXOL  350 MG/ML SOLN COMPARISON:  Chest x-ray from same day. PET-CT dated September 26, 2023. CT chest dated August 03, 2023. FINDINGS: Cardiovascular: Satisfactory opacification of the pulmonary arteries to the segmental level. No evidence of pulmonary embolism. Normal heart size status post CABG. No pericardial effusion. No thoracic aortic aneurysm or dissection. Coronary, aortic arch, and branch vessel atherosclerotic vascular disease. Mediastinum/Nodes: Left AP window lymph node currently measures 1.4 cm in short axis, previously 1.8 cm. No other enlarged mediastinal or hilar lymph node. No enlarged axillary lymph nodes. Thyroid  gland, trachea, and esophagus demonstrate no significant findings. Lungs/Pleura: New patchy irregular ground-glass densities primarily in the left lower lobe, with additional small, more dense  consolidative opacity in the medial right lower lobe. Unchanged scarring in the left upper lobe. Scattered small pulmonary nodules in both lungs measuring up to 7 mm are unchanged. Upper Abdomen: No acute abnormality. Musculoskeletal: No chest wall abnormality. New lytic lesion in the T4 vertebral body (series 602, image 104). Other treated mixed sclerotic and lucent lesions within multiple thoracic vertebral bodies are unchanged. Old fracture deformity of the left scapular body again noted. Review of the MIP images confirms the above findings. IMPRESSION: 1. No evidence of pulmonary embolism. 2. New patchy irregular ground-glass densities primarily in the left lower lobe, with additional small, more dense consolidative opacity in the medial right lower lobe, consistent with multifocal pneumonia. This accounts for the density seen on chest x-ray. 3. New lytic lesion in the T4 vertebral body, consistent with progression of multiple myeloma. 4.  Aortic Atherosclerosis (ICD10-I70.0). Electronically Signed   By: Elsie ONEIDA Shoulder M.D.   On: 11/12/2023 16:30   DG Chest Port 1 View Result Date: 11/12/2023 CLINICAL DATA:  Cough, chills, and weakness. Currently on chemotherapy for multiple myeloma. EXAM: PORTABLE CHEST 1 VIEW COMPARISON:  PET-CT dated September 26, 2023. Chest x-ray dated August 02, 2023. FINDINGS: The heart size and mediastinal contours are within normal limits. Prior CABG. Normal pulmonary vascularity. Similar scarring in the left upper lobe. 2.9 cm spiculated nodule in the mid left lung, not seen on the prior x-ray and without clear correlate on recent PET-CT. No focal consolidation, pleural effusion, or pneumothorax. No acute osseous abnormality. Chronic fracture deformity of the left scapula again noted. IMPRESSION: 1. 2.9 cm spiculated nodule in the mid left lung, not seen on the prior x-ray and without clear correlate on recent PET-CT. Differential considerations include infection or malignancy.  Recommend further evaluation with chest CT. Electronically Signed   By: Elsie ONEIDA Shoulder M.D.   On: 11/12/2023  12:33   Scheduled Meds:  famciclovir   250 mg Oral Daily   filgrastim -aafi  480 mcg Subcutaneous Daily   [START ON 11/14/2023] guaiFENesin   1,200 mg Oral BID   ipratropium  0.5 mg Nebulization Q6H   levalbuterol   0.63 mg Nebulization Q6H   levETIRAcetam   500 mg Oral BID   lidocaine   1 patch Transdermal Q24H   pantoprazole   40 mg Oral BID   pregabalin   200 mg Oral BID   rosuvastatin   40 mg Oral Daily   saccharomyces boulardii  250 mg Oral Daily   Continuous Infusions:  azithromycin  Stopped (11/13/23 1527)   ceFEPime  (MAXIPIME ) IV 2 g (11/13/23 2140)    LOS: 1 day   Alejandro Marker, DO [facility] Available via Epic secure chat 7am-7pm After these hours, please refer to coverage provider listed on [email or web address] 11/13/2023, 9:51 PM

## 2023-11-14 ENCOUNTER — Inpatient Hospital Stay (HOSPITAL_COMMUNITY): Payer: Medicare Other

## 2023-11-14 DIAGNOSIS — N19 Unspecified kidney failure: Secondary | ICD-10-CM | POA: Diagnosis not present

## 2023-11-14 DIAGNOSIS — J189 Pneumonia, unspecified organism: Secondary | ICD-10-CM | POA: Diagnosis not present

## 2023-11-14 DIAGNOSIS — E860 Dehydration: Secondary | ICD-10-CM | POA: Diagnosis not present

## 2023-11-14 DIAGNOSIS — Z9481 Bone marrow transplant status: Secondary | ICD-10-CM | POA: Diagnosis not present

## 2023-11-14 DIAGNOSIS — C9002 Multiple myeloma in relapse: Secondary | ICD-10-CM | POA: Diagnosis not present

## 2023-11-14 DIAGNOSIS — C9000 Multiple myeloma not having achieved remission: Secondary | ICD-10-CM | POA: Diagnosis not present

## 2023-11-14 DIAGNOSIS — M549 Dorsalgia, unspecified: Secondary | ICD-10-CM | POA: Diagnosis not present

## 2023-11-14 LAB — COMPREHENSIVE METABOLIC PANEL
ALT: 27 U/L (ref 0–44)
AST: 16 U/L (ref 15–41)
Albumin: 2.6 g/dL — ABNORMAL LOW (ref 3.5–5.0)
Alkaline Phosphatase: 55 U/L (ref 38–126)
Anion gap: 6 (ref 5–15)
BUN: 28 mg/dL — ABNORMAL HIGH (ref 8–23)
CO2: 19 mmol/L — ABNORMAL LOW (ref 22–32)
Calcium: 7.6 mg/dL — ABNORMAL LOW (ref 8.9–10.3)
Chloride: 109 mmol/L (ref 98–111)
Creatinine, Ser: 1.16 mg/dL (ref 0.61–1.24)
GFR, Estimated: >60 mL/min (ref >60)
Glucose, Bld: 91 mg/dL (ref 70–99)
Potassium: 3.6 mmol/L (ref 3.5–5.1)
Sodium: 134 mmol/L — ABNORMAL LOW (ref 135–145)
Total Bilirubin: 0.5 mg/dL (ref 0.0–1.2)
Total Protein: 6 g/dL — ABNORMAL LOW (ref 6.5–8.1)

## 2023-11-14 LAB — RETICULOCYTES
Immature Retic Fract: 17 % — ABNORMAL HIGH (ref 2.3–15.9)
RBC.: 3.2 MIL/uL — ABNORMAL LOW (ref 4.22–5.81)
Retic Count, Absolute: 40.6 10*3/uL (ref 19.0–186.0)
Retic Ct Pct: 1.3 % (ref 0.4–3.1)

## 2023-11-14 LAB — EXPECTORATED SPUTUM ASSESSMENT W GRAM STAIN, RFLX TO RESP C

## 2023-11-14 LAB — CBC WITH DIFFERENTIAL/PLATELET
Abs Immature Granulocytes: 0.25 10*3/uL — ABNORMAL HIGH (ref 0.00–0.07)
Basophils Absolute: 0 10*3/uL (ref 0.0–0.1)
Basophils Relative: 1 %
Eosinophils Absolute: 0.2 10*3/uL (ref 0.0–0.5)
Eosinophils Relative: 5 %
HCT: 33.1 % — ABNORMAL LOW (ref 39.0–52.0)
Hemoglobin: 10.3 g/dL — ABNORMAL LOW (ref 13.0–17.0)
Immature Granulocytes: 8 %
Lymphocytes Relative: 11 %
Lymphs Abs: 0.3 10*3/uL — ABNORMAL LOW (ref 0.7–4.0)
MCH: 30.6 pg (ref 26.0–34.0)
MCHC: 31.1 g/dL (ref 30.0–36.0)
MCV: 98.2 fL (ref 80.0–100.0)
Monocytes Absolute: 0.6 10*3/uL (ref 0.1–1.0)
Monocytes Relative: 20 %
Neutro Abs: 1.6 10*3/uL — ABNORMAL LOW (ref 1.7–7.7)
Neutrophils Relative %: 55 %
Platelets: 75 10*3/uL — ABNORMAL LOW (ref 150–400)
RBC: 3.37 MIL/uL — ABNORMAL LOW (ref 4.22–5.81)
RDW: 14.4 % (ref 11.5–15.5)
WBC: 3 10*3/uL — ABNORMAL LOW (ref 4.0–10.5)
nRBC: 0 % (ref 0.0–0.2)

## 2023-11-14 LAB — PHOSPHORUS: Phosphorus: 2.7 mg/dL (ref 2.5–4.6)

## 2023-11-14 LAB — MAGNESIUM: Magnesium: 2 mg/dL (ref 1.7–2.4)

## 2023-11-14 MED ORDER — ALBUTEROL SULFATE (2.5 MG/3ML) 0.083% IN NEBU: 2.5000 mg | INHALATION_SOLUTION | RESPIRATORY_TRACT | Status: DC | PRN

## 2023-11-14 MED ORDER — ARFORMOTEROL TARTRATE 15 MCG/2ML IN NEBU
15.0000 ug | INHALATION_SOLUTION | Freq: Two times a day (BID) | RESPIRATORY_TRACT | Status: DC
  Administered 2023-11-14 – 2023-11-16 (×4): 15 ug via RESPIRATORY_TRACT
  Filled 2023-11-14 (×4): qty 2

## 2023-11-14 MED ORDER — IPRATROPIUM BROMIDE 0.02 % IN SOLN
0.5000 mg | Freq: Three times a day (TID) | RESPIRATORY_TRACT | Status: DC
  Administered 2023-11-14 – 2023-11-15 (×4): 0.5 mg via RESPIRATORY_TRACT
  Filled 2023-11-14 (×4): qty 2.5

## 2023-11-14 MED ORDER — BUDESONIDE 0.25 MG/2ML IN SUSP
0.2500 mg | Freq: Two times a day (BID) | RESPIRATORY_TRACT | Status: DC
  Administered 2023-11-14 – 2023-11-16 (×4): 0.25 mg via RESPIRATORY_TRACT
  Filled 2023-11-14 (×4): qty 2

## 2023-11-14 MED ORDER — LEVALBUTEROL HCL 0.63 MG/3ML IN NEBU
0.6300 mg | INHALATION_SOLUTION | Freq: Three times a day (TID) | RESPIRATORY_TRACT | Status: DC
  Administered 2023-11-14 – 2023-11-15 (×4): 0.63 mg via RESPIRATORY_TRACT
  Filled 2023-11-14 (×4): qty 3

## 2023-11-14 MED ORDER — TRAMADOL HCL 50 MG PO TABS
100.0000 mg | ORAL_TABLET | Freq: Four times a day (QID) | ORAL | Status: DC | PRN
Start: 2023-11-14 — End: 2023-11-16
  Administered 2023-11-14 – 2023-11-16 (×8): 100 mg via ORAL
  Filled 2023-11-14 (×8): qty 2

## 2023-11-14 NOTE — Progress Notes (Signed)
 Bruce Smith may feel little bit better today.  He is still coughing of some purulent mucus.  I do not see where this has been cultured.  This probably does need to be culture even though he is on antibiotics right now.  He is wheezing quite a bit.  He definitely does have some nebulizers.  His white cell count is 3.0.  Hemoglobin 10.3.  Platelet count 75,000.  Sodium 134.  Potassium 3.6.  BUN 28 creatinine 1.16.  Calcium  7.6 with an albumin  of 2.6.  He had a chest x-ray this morning.  This has not been read.  His appetite might be a little bit better.  So far, his blood cultures have been negative.  However he continues on IV antibiotics.  He is having some pain issues.  This is been chronic for him.  He is getting hydromorphone  IV.  He has been on tramadol  at home.  So far, tramadol , at home, has been very effective for him.  He has had no problems with diarrhea.  He has had no issues with rashes.  There is been no bleeding.  His vital signs show temperature 98.1.  Pulse 58.  Blood pressure 134/55.  His lungs sound okay although he has wheezing, more so on the left side than on the right side.  He has decent air movement.  I think he would benefit from incentive spirometry.  His cardiac exam is regular rate and rhythm.  Abdomen is soft.  Bowel sounds are present.  There is no fluid wave.  There is no palpable liver or spleen tip.  Extremity shows no clubbing, cyanosis or edema.  Neurological exam shows no focal neurological deficits.  Bruce Smith got IVIG yesterday.  This will hopefully help with his immune system and with his pneumonia.  Again, I think that a sputum culture would be helpful although he is on antibiotics now.  His white cell count is improved with the Neupogen .  I will probably give another dose of Neupogen .  I would like to see his white cell count up a little bit higher.  We will have to see what the chest x-ray shows.  He definitely needs nebulizers on schedule for right  now.  I know his wife is not doing well right now.  She has a respiratory illness.  I feel bad for her.  His sister was with him this morning.  She is always a lot of fun to talk with.

## 2023-11-14 NOTE — Progress Notes (Signed)
 PROGRESS NOTE    Bruce Smith  FMW:[id number] DOB: 11-08-1950 DOA: 11/12/2023 PCP: Lelon Norleen ORN., MD   Brief Narrative:  The patient is a 73 year old M with PMH of MM s/p BMT [facility]), currently on chemotherapy and followed by Dr. Timmy, CAD/CABG, HTN and HLD presented to med [facility] with runny nose, productive cough, dizziness, chills, generalized weakness and feeling unwell.  Vitals in ED significant for tachypnea and mild temp to 99.9.  No documented desaturation but started on 2 L.  CMP without significant finding.  CBC with leukopenia/neutropenia and thrombocytopenia.  BNP 619.  Troponin 19 x 2.  Lactic acid 1.9.  UA without significant finding.  CT angio chest negative for PE but concerning for multifocal pneumonia and new lytic lesion in T4 vertebral body consistent with progression of MM.  Blood cultures obtained.  Received 1 L bolus.  Started on ceftriaxone  and Zithromax .  Admission requested for multifocal pneumonia and Sepsis.   Medical Oncology consulted for further evaluation.  Assessment and Plan:  Sepsis 2/2 to Multifocal Community Acquired PNA -Had 2-3 Days of new Onset Productive Cough, Subjective Fever, Chills -CTA Chest done and showed No evidence of pulmonary embolism. New patchy irregular ground-glass densities primarily in the left lower lobe, with additional small, more dense consolidative opacity in the medial right lower lobe, consistent with multifocal pneumonia. This accounts for the density seen on chest x-ray. New lytic lesion in the T4 vertebral body, consistent with progression of multiple myeloma.  Aortic Atherosclerosis -PCT was 23.26 -Continue Abx with Cefepime  and Azithromycin  -C/w Guaifenesin  1200 mg po BID -Add Xopenex  and Atrovent  scheduled -IVF now stopped -Check Mycoplasma Ab and Strep Urine Ag -SpO2: 93 % O2 Flow Rate (L/min): 2 L/min -Continuous Pulse Oximetry and Maintain O2 Sats >90%  -Continue Supplemental O2 Via [place] and Wean O2 as  Tolerated -Repeat CXR in the AM   Generalized Weakness -Has had Generalized Weakness  -IVF now stopped -PT/OT to evaluate and Treat -Checked TSH and was 1.182  Acute on Chronic Thoracic Back Discomfort -Has Lytic Bone Lesions as a Consequence of MM -C/w Home PRN Tramadol  PRN and Acetaminophen  -C/w IV Dilaudid  for Severe Pain -C/w Lidocaine  Patch -Med Onc consulted and appreciate Dr. Jessy evaluation  Elevated Troponin -Troponin Level was 19 -No CP -Continue to Monitor and Trend  Dehydration -IVF now stopped -Continue to Monitor  Metabolic Acidosis -Improved. CO2 is now 24, AG of 6, Chloride Level of 109 -Continue to Monitor and trend and repeat CMP in the AM   Pancytopenia -CBC Trend: Recent Labs  Lab 10/19/23 1016 10/26/23 1014 11/02/23 1014 11/02/23 1014 11/12/23 1228 11/12/23 1423 11/13/23 0456  WBC 5.4 4.2 4.8  --  SPECIMEN CLOTTED NOTIFIED SOPHIE GOUGE RN 1350 11/12/23 CMR 1.2* 1.9*  HGB 12.1* 12.0* 11.7*   < > SPECIMEN CLOTTED NOTIFIED SOPHIE GOUGE RN 1350 11/12/23 CMR 11.5* 10.4*  HCT 36.9* 36.3* 35.8*  --  SPECIMEN CLOTTED NOTIFIED SOPHIE GOUGE RN 1350 11/12/23 CMR 35.4* 31.8*  MCV 93.2 93.3 93.7  --  SPECIMEN CLOTTED NOTIFIED SOPHIE GOUGE RN 1350 11/12/23 CMR 93.2 94.9  PLT 101* 72* 41*   < > SPECIMEN CLOTTED NOTIFIED SOPHIE GOUGE RN 1350 11/12/23 CMR 63* 65*   < > = values in this interval not displayed.  -Check Anemia Panel in the AM -Getting Filgrastim -aafi 480 mcg sq Daily  -Continue to Monitor for S/Sx of bleeding; No overt bleeding noted -Repeat CBC in the AM  GERD/GI Prophylaxis -Continue PPI with Pantoprazole   40 mg po BID  Multiple Myeloma -Dr. Jacquelene Consulted -Currently on Kyprolis  Pomalidomide  -Getting a dose of IVIG  Hypoalbuminemia -Patient's Albumin  Trend: Recent Labs  Lab 10/19/23 1016 10/26/23 1014 11/02/23 1014 11/12/23 1228 11/13/23 0456  ALBUMIN  4.1 3.9 3.8 3.4* 2.8*  -Continue to Monitor and Trend and repeat CMP in  the AM  Class I Obesity -Complicates overall prognosis and care -Estimated body mass index is 31.68 kg/m as calculated from the following:   Height as of this encounter: 5' 8 (1.727 m).   Weight as of this encounter: 94.5 kg.  -Weight Loss and Dietary Counseling given   DVT prophylaxis: SCDs Start: 11/12/23 1925    Code Status: Full Code Family Communication: Discussed with Wife at bedside  Disposition Plan:  Level of care: Progressive Status is: Inpatient Remains inpatient appropriate because: Needs further clinical improvement    Consultants:  Medical Oncology   Procedures:  As delineated as above  Antimicrobials:  Anti-infectives (From admission, onward)    Start     Dose/Rate Route Frequency Ordered Stop   11/13/23 1000  azithromycin  (ZITHROMAX ) 500 mg in sodium chloride  0.9 % 250 mL IVPB        500 mg 250 mL/hr over 60 Minutes Intravenous Every 24 hours 11/12/23 1926     11/13/23 1000  cefTRIAXone  (ROCEPHIN ) 1 g in sodium chloride  0.9 % 100 mL IVPB  Status:  Discontinued        1 g 200 mL/hr over 30 Minutes Intravenous Every 24 hours 11/12/23 1926 11/12/23 2020   11/13/23 1000  famciclovir  (FAMVIR ) tablet 250 mg        250 mg Oral Daily 11/12/23 2030     11/12/23 2200  ceFEPIme  (MAXIPIME ) 2 g in sodium chloride  0.9 % 100 mL IVPB        2 g 200 mL/hr over 30 Minutes Intravenous Every 8 hours 11/12/23 2108     11/12/23 1300  cefTRIAXone  (ROCEPHIN ) 1 g in sodium chloride  0.9 % 100 mL IVPB        1 g 200 mL/hr over 30 Minutes Intravenous  Once 11/12/23 1254 11/12/23 1431   11/12/23 1300  azithromycin  (ZITHROMAX ) 500 mg in sodium chloride  0.9 % 250 mL IVPB        500 mg 250 mL/hr over 60 Minutes Intravenous  Once 11/12/23 1254 11/12/23 1431       Subjective: Seen and examined at bedside and he is doing okay and feeling little bit better but still feels a little short of breath and continues to cough up some sputum.  No nausea or vomiting.  No lightheadedness or  dizziness.  Continues to feel weak.  Objective: Vitals:   11/14/23 0331 11/14/23 0825 11/14/23 1100 11/14/23 1545  BP: (!) 134/55   (!) 119/56  Pulse: (!) 58   64  Resp: 16   18  Temp: 98.1 F (36.7 C)   98.4 F (36.9 C)  TempSrc: Oral   Oral  SpO2: 97% 97% 94% 95%  Weight:      Height:        Intake/Output Summary (Last 24 hours) at 11/14/2023 1924 Last data filed at 11/14/2023 1800 Gross per 24 hour  Intake 790 ml  Output --  Net 790 ml   Filed Weights   11/12/23 1200  Weight: 94.5 kg   Examination: Physical Exam:  Constitutional: WN/WD obese chronically ill-appearing Caucasian male in no acute distress but appears Respiratory: Diminished to auscultation bilaterally with coarse breath sounds and  has some rhonchi and slight crackles.  No appreciable wheezing or rales.Normal respiratory effort and patient is not tachypenic.  Wearing supplemental oxygen Cardiovascular: RRR, no murmurs / rubs / gallops. S1 and S2 auscultated. Mild LE Edema  Abdomen: Soft, non-tender, Distended 2/2 body habitus. Bowel sounds positive.  GU: Deferred. Musculoskeletal: No clubbing / cyanosis of digits/nails. No joint deformity upper and lower extremities Skin: No rashes, lesions, ulcers on a limited skin evaluation. No induration; Warm and dry.  Neurologic: CN 2-12 grossly intact with no focal deficits. Romberg sign and cerebellar reflexes not assessed.  Psychiatric: Normal judgment and insight. Alert and oriented x 3. Normal mood and appropriate affect.   Data Reviewed: I have personally reviewed following labs and imaging studies  CBC: Recent Labs  Lab 11/12/23 1228 11/12/23 1423 11/13/23 0456 11/14/23 0458  WBC SPECIMEN CLOTTED NOTIFIED SOPHIE GOUGE RN 1350 11/12/23 CMR 1.2* 1.9* 3.0*  NEUTROABS PENDING 0.4* 0.9* 1.6*  HGB SPECIMEN CLOTTED NOTIFIED SOPHIE GOUGE RN 1350 11/12/23 CMR 11.5* 10.4* 10.3*  HCT SPECIMEN CLOTTED NOTIFIED SOPHIE GOUGE RN 1350 11/12/23 CMR 35.4* 31.8* 33.1*   MCV SPECIMEN CLOTTED NOTIFIED SOPHIE GOUGE RN 1350 11/12/23 CMR 93.2 94.9 98.2  PLT SPECIMEN CLOTTED NOTIFIED SOPHIE GOUGE RN 1350 11/12/23 CMR 63* 65* 75*   Basic Metabolic Panel: Recent Labs  Lab 11/12/23 1228 11/13/23 0456 11/14/23 0458  NA 136 139 134*  K 3.5 5.0 3.6  CL 107 109 109  CO2 21* 24 19*  GLUCOSE 109* 98 91  BUN 27* 32* 28*  CREATININE 1.12 1.16 1.16  CALCIUM  8.5* 8.0* 7.6*  MG  --  1.8 2.0  PHOS  --  3.6 2.7   GFR: Estimated Creatinine Clearance: 64.2 mL/min (by C-G formula based on SCr of 1.16 mg/dL). Liver Function Tests: Recent Labs  Lab 11/12/23 1228 11/13/23 0456 11/14/23 0458  AST 27 17 16   ALT 46* 32 27  ALKPHOS 80 55 55  BILITOT 0.6 0.5 0.5  PROT 6.2* 5.3* 6.0*  ALBUMIN  3.4* 2.8* 2.6*   No results for input(s): LIPASE, AMYLASE in the last 168 hours. No results for input(s): AMMONIA in the last 168 hours. Coagulation Profile: Recent Labs  Lab 11/13/23 0456  INR 1.3*   Cardiac Enzymes: No results for input(s): CKTOTAL, CKMB, CKMBINDEX, TROPONINI in the last 168 hours. BNP (last 3 results) No results for input(s): PROBNP in the last 8760 hours. HbA1C: No results for input(s): HGBA1C in the last 72 hours. CBG: No results for input(s): GLUCAP in the last 168 hours. Lipid Profile: No results for input(s): CHOL, HDL, LDLCALC, TRIG, CHOLHDL, LDLDIRECT in the last 72 hours. Thyroid  Function Tests: Recent Labs    11/13/23 0456  TSH 1.182   Anemia Panel: Recent Labs    11/13/23 0456  VITAMINB12 346   Sepsis Labs: Recent Labs  Lab 11/12/23 1228 11/13/23 0456  PROCALCITON  --  23.26  LATICACIDVEN 1.9  --    Recent Results (from the past 240 hours)  Resp panel by RT-PCR (RSV, Flu A&B, Covid) Anterior Nasal Swab     Status: None   Collection Time: 11/12/23 12:03 PM   Specimen: Anterior Nasal Swab  Result Value Ref Range Status   SARS Coronavirus 2 by RT PCR NEGATIVE NEGATIVE Final    Comment:  (NOTE) SARS-CoV-2 target nucleic acids are NOT DETECTED.  The SARS-CoV-2 RNA is generally detectable in upper respiratory specimens during the acute phase of infection. The lowest concentration of SARS-CoV-2 viral copies this assay can detect  is 138 copies/mL. A negative result does not preclude SARS-Cov-2 infection and should not be used as the sole basis for treatment or other patient management decisions. A negative result may occur with  improper specimen collection/handling, submission of specimen other than nasopharyngeal swab, presence of viral mutation(s) within the areas targeted by this assay, and inadequate number of viral copies(<138 copies/mL). A negative result must be combined with clinical observations, patient history, and epidemiological information. The expected result is Negative.  Fact Sheet for Patients:  bloggercourse.com  Fact Sheet for Healthcare Providers:  seriousbroker.it  This test is no t yet approved or cleared by the [place]  FDA and  has been authorized for detection and/or diagnosis of SARS-CoV-2 by FDA under an Emergency Use Authorization (EUA). This EUA will remain  in effect (meaning this test can be used) for the duration of the COVID-19 declaration under Section 564(b)(1) of the Act, 21 U.S.C.section 360bbb-3(b)(1), unless the authorization is terminated  or revoked sooner.       Influenza A by PCR NEGATIVE NEGATIVE Final   Influenza B by PCR NEGATIVE NEGATIVE Final    Comment: (NOTE) The Xpert Xpress SARS-CoV-2/FLU/RSV plus assay is intended as an aid in the diagnosis of influenza from Nasopharyngeal swab specimens and should not be used as a sole basis for treatment. Nasal washings and aspirates are unacceptable for Xpert Xpress SARS-CoV-2/FLU/RSV testing.  Fact Sheet for Patients: bloggercourse.com  Fact Sheet for Healthcare  Providers: seriousbroker.it  This test is not yet approved or cleared by the [place]  FDA and has been authorized for detection and/or diagnosis of SARS-CoV-2 by FDA under an Emergency Use Authorization (EUA). This EUA will remain in effect (meaning this test can be used) for the duration of the COVID-19 declaration under Section 564(b)(1) of the Act, 21 U.S.C. section 360bbb-3(b)(1), unless the authorization is terminated or revoked.     Resp Syncytial Virus by PCR NEGATIVE NEGATIVE Final    Comment: (NOTE) Fact Sheet for Patients: bloggercourse.com  Fact Sheet for Healthcare Providers: seriousbroker.it  This test is not yet approved or cleared by the [place]  FDA and has been authorized for detection and/or diagnosis of SARS-CoV-2 by FDA under an Emergency Use Authorization (EUA). This EUA will remain in effect (meaning this test can be used) for the duration of the COVID-19 declaration under Section 564(b)(1) of the Act, 21 U.S.C. section 360bbb-3(b)(1), unless the authorization is terminated or revoked.  Performed at [facility], [address] [place], [place] [address]   Blood culture (routine x 2)     Status: None (Preliminary result)   Collection Time: 11/12/23 12:25 PM   Specimen: BLOOD  Result Value Ref Range Status   Specimen Description   Final    BLOOD RIGHT ANTECUBITAL Performed at [facility], [address] [place], [place] [address]    Special Requests   Final    BOTTLES DRAWN AEROBIC AND ANAEROBIC Blood Culture adequate volume Performed at [facility], [address] [place], [place] [address]    Culture   Final    NO GROWTH 2 DAYS Performed at [facility], [address] [place], [place] [address]    Report Status PENDING  Incomplete  Blood culture (routine x 2)     Status: None (Preliminary result)   Collection Time:  11/12/23  1:20 PM   Specimen: BLOOD RIGHT WRIST  Result Value Ref Range Status   Specimen Description  Final    BLOOD RIGHT WRIST Performed at [facility], [address] [place], [place] [address]    Special Requests   Final    BOTTLES DRAWN AEROBIC AND ANAEROBIC Blood Culture results may not be optimal due to an inadequate volume of blood received in culture bottles Performed at [facility], [address] [place], [place] [address]    Culture   Final    NO GROWTH 2 DAYS Performed at [facility], [address], [place], [place] [address]    Report Status PENDING  Incomplete  Expectorated Sputum Assessment w Gram Stain, Rflx to Resp Cult     Status: None   Collection Time: 11/14/23  5:44 PM   Specimen: Expectorated Sputum  Result Value Ref Range Status   Specimen Description EXPECTORATED SPUTUM  Final   Special Requests Immunocompromised  Final   Sputum evaluation   Final    THIS SPECIMEN IS ACCEPTABLE FOR SPUTUM CULTURE Performed at [facility], [address], [place], [place] [address]    Report Status 11/14/2023 FINAL  Final    Radiology Studies: DG CHEST PORT 1 VIEW Result Date: 11/14/2023 CLINICAL DATA:  Shortness of breath EXAM: PORTABLE CHEST 1 VIEW COMPARISON:  11/12/2023 FINDINGS: Prior CABG. Heart and mediastinal contours within normal limits. Patchy left upper lobe airspace opacity is increased since prior study. No confluent opacity on the right. No effusions or acute bony abnormality. IMPRESSION: Increasing patchy left upper lobe airspace disease. Electronically Signed   By: Franky Crease M.D.   On: 11/14/2023 09:32   Scheduled Meds:  arformoterol   15 mcg Nebulization BID   budesonide  (PULMICORT ) nebulizer solution  0.25 mg Nebulization BID   famciclovir   250 mg Oral Daily   filgrastim -aafi  480 mcg Subcutaneous Daily   guaiFENesin   1,200 mg Oral BID   ipratropium  0.5 mg Nebulization TID   levalbuterol   0.63  mg Nebulization TID   levETIRAcetam   500 mg Oral BID   lidocaine   1 patch Transdermal Q24H   pantoprazole   40 mg Oral BID   pregabalin   200 mg Oral BID   rosuvastatin   40 mg Oral Daily   saccharomyces boulardii  250 mg Oral Daily   Continuous Infusions:  azithromycin  Stopped (11/14/23 1155)   ceFEPime  (MAXIPIME ) IV Stopped (11/14/23 1302)    LOS: 2 days   Alejandro Marker, DO [facility] Available via Epic secure chat 7am-7pm After these hours, please refer to coverage provider listed on [email or web address] 11/14/2023, 7:24 PM

## 2023-11-14 NOTE — Plan of Care (Signed)
  Problem: Education: Goal: Knowledge of General Education information will improve Description: Including pain rating scale, medication(s)/side effects and non-pharmacologic comfort measures Outcome: Progressing   Problem: Health Behavior/Discharge Planning: Goal: Ability to manage health-related needs will improve Outcome: Progressing   Problem: Activity: Goal: Risk for activity intolerance will decrease Outcome: Progressing   Problem: Pain Management: Goal: General experience of comfort will improve Outcome: Progressing

## 2023-11-14 NOTE — Progress Notes (Signed)
 PT Cancellation Note  Patient Details Name: Bruce Smith MRN: [id number] DOB: [birth year]   Cancelled Treatment:    Reason Eval/Treat Not Completed: Fatigue/lethargy limiting ability to participate Patient reports that he is in a sweat, has been up to BR. Feels that he needs rest. Will check back another time as schedule allows. Darice Potters PT Acute Rehabilitation Services Office [phone] Weekend pager-[phone]   Potters Darice Norris 11/14/2023, 9:29 AM

## 2023-11-14 NOTE — Evaluation (Signed)
 Occupational Therapy Evaluation Patient Details Name: Bruce Smith MRN: [id number] DOB: [birth year] Today's Date: 11/14/2023   History of Present Illness Heywood Tokunaga is a 73 y.o. male with medical history significant for multiple myeloma status post transplant currently on chemotherapy, coronary disease status post CABG in May 2024, essential pretension, hyperlipidemia, who is admitted to [facility] on 11/12/2023 by way of transfer from [facility] with sepsis due to multifocal community-acquired pneumonia after presenting from home to the latter facility complaining of new onset productive cough   Clinical Impression   Pt presents with decline in function and safety with ADLs and ADL mobility with impaired balance and endurance. PTA pt lived at home with his wife and was Ind with ADLs/selfcare and used a SPC for mobility. Pt currently requires CGA - Sup with ADL mobility using RW and LB ADLs. Pt would benefit from acute OT services to address impairments to maximize level of function and safety       If plan is discharge home, recommend the following: A little help with bathing/dressing/bathroom;A little help with walking and/or transfers    Functional Status Assessment  Patient has had a recent decline in their functional status and demonstrates the ability to make significant improvements in function in a reasonable and predictable amount of time.  Equipment Recommendations  None recommended by OT    Recommendations for Other Services       Precautions / Restrictions Precautions Precautions: None Restrictions Weight Bearing Restrictions Per Provider Order: No      Mobility Bed Mobility Overal bed mobility: Independent                  Transfers Overall transfer level: Needs assistance Equipment used: Rolling walker (2 wheels) Transfers: Sit to/from Stand, Bed to chair/wheelchair/BSC Sit to Stand: Contact guard assist     Step pivot transfers:  Supervision            Balance Overall balance assessment: Mild deficits observed, not formally tested                                         ADL either performed or assessed with clinical judgement   ADL Overall ADL's : Needs assistance/impaired Eating/Feeding: Independent;Sitting   Grooming: Wash/dry hands;Wash/dry face;Supervision/safety;Standing   Upper Body Bathing: Set up   Lower Body Bathing: Contact guard assist   Upper Body Dressing : Set up   Lower Body Dressing: Contact guard assist   Toilet Transfer: Supervision/safety;Ambulation;Rolling walker (2 wheels);Regular Toilet;Grab bars   Toileting- Clothing Manipulation and Hygiene: Supervision/safety;Sit to/from stand       Functional mobility during ADLs: Contact guard assist;Supervision/safety;Rolling walker (2 wheels)       Vision Baseline Vision/History: 1 Wears glasses Ability to See in Adequate Light: 0 Adequate Patient Visual Report: No change from baseline       Perception         Praxis         Pertinent Vitals/Pain Pain Assessment Pain Assessment: 0-10 Pain Score: 4  Pain Location: all over Pain Descriptors / Indicators: Aching Pain Intervention(s): Monitored during session, Repositioned     Extremity/Trunk Assessment Upper Extremity Assessment Upper Extremity Assessment: Overall WFL for tasks assessed   Lower Extremity Assessment Lower Extremity Assessment: Defer to PT evaluation   Cervical / Trunk Assessment Cervical / Trunk Assessment: Normal   Communication Communication Communication: No apparent difficulties  Cognition Arousal: Alert Behavior During Therapy: WFL for tasks assessed/performed Overall Cognitive Status: Within Functional Limits for tasks assessed                                       General Comments       Exercises     Shoulder Instructions      Home Living Family/patient expects to be discharged to:: Private  residence Living Arrangements: Spouse/significant other Available Help at Discharge: Family;Available 24 hours/day Type of Home: House Home Access: Ramped entrance     Home Layout: Two level;Able to live on main level with bedroom/bathroom Alternate Level Stairs-Number of Steps: flight Alternate Level Stairs-Rails: Left Bathroom Shower/Tub: Producer, Television/film/video: Handicapped height Bathroom Accessibility: Yes How Accessible: Accessible via walker Home Equipment: Rollator (4 wheels);Cane - single Librarian, Academic (2 wheels);BSC/3in1;Shower seat - built in;Grab bars - tub/shower;Hand held shower head;Wheelchair - manual          Prior Functioning/Environment Prior Level of Function : Independent/Modified Independent;Driving             Mobility Comments: Mod I SPC ADLs Comments: Ind with ADLs/selfcare, drives        OT Problem List: Pain;Impaired balance (sitting and/or standing);Decreased activity tolerance      OT Treatment/Interventions: Self-care/ADL training;DME and/or AE instruction;Therapeutic activities;Energy conservation;Patient/family education    OT Goals(Current goals can be found in the care plan section) Acute Rehab OT Goals Patient Stated Goal: go home OT Goal Formulation: With patient/family Time For Goal Achievement: 11/28/23 Potential to Achieve Goals: Good ADL Goals Pt Will Perform Grooming: with set-up;with modified independence;standing Pt Will Perform Lower Body Bathing: with supervision;with modified independence;sit to/from stand Pt Will Perform Lower Body Dressing: with supervision;with modified independence;sit to/from stand Pt Will Transfer to Toilet: with modified independence;ambulating Pt Will Perform Toileting - Clothing Manipulation and hygiene: with modified independence;sit to/from stand Pt Will Perform Tub/Shower Transfer: with supervision;with modified independence;ambulating;rolling walker;shower seat;grab bars  OT  Frequency: Min 1X/week    Co-evaluation              AM-PAC OT 6 Clicks Daily Activity     Outcome Measure Help from another person eating meals?: None Help from another person taking care of personal grooming?: A Little Help from another person toileting, which includes using toliet, bedpan, or urinal?: A Little Help from another person bathing (including washing, rinsing, drying)?: A Little Help from another person to put on and taking off regular upper body clothing?: A Little Help from another person to put on and taking off regular lower body clothing?: A Little 6 Click Score: 19   End of Session Equipment Utilized During Treatment: Gait belt;Rolling walker (2 wheels)  Activity Tolerance: Patient tolerated treatment well Patient left: in bed;with call bell/phone within reach;with family/visitor present  OT Visit Diagnosis: Unsteadiness on feet (R26.81);Muscle weakness (generalized) (M62.81);Pain Pain - part of body:  (generalized)                Time: 1417-1440 OT Time Calculation (min): 23 min Charges:  OT General Charges $OT Visit: 1 Visit OT Evaluation $OT Eval Low Complexity: 1 Low OT Treatments $Therapeutic Activity: 8-22 mins    Jacques Karna Loose 11/14/2023, 2:50 PM

## 2023-11-15 ENCOUNTER — Inpatient Hospital Stay (HOSPITAL_COMMUNITY): Payer: Medicare Other

## 2023-11-15 DIAGNOSIS — C9000 Multiple myeloma not having achieved remission: Secondary | ICD-10-CM | POA: Diagnosis not present

## 2023-11-15 LAB — IRON AND TIBC
Iron: 35 ug/dL — ABNORMAL LOW (ref 45–182)
Saturation Ratios: 16 % — ABNORMAL LOW (ref 17.9–39.5)
TIBC: 225 ug/dL — ABNORMAL LOW (ref 250–450)
UIBC: 190 ug/dL

## 2023-11-15 LAB — COMPREHENSIVE METABOLIC PANEL
ALT: 28 U/L (ref 0–44)
AST: 19 U/L (ref 15–41)
Albumin: 2.5 g/dL — ABNORMAL LOW (ref 3.5–5.0)
Alkaline Phosphatase: 66 U/L (ref 38–126)
Anion gap: 7 (ref 5–15)
BUN: 21 mg/dL (ref 8–23)
CO2: 21 mmol/L — ABNORMAL LOW (ref 22–32)
Calcium: 7.9 mg/dL — ABNORMAL LOW (ref 8.9–10.3)
Chloride: 111 mmol/L (ref 98–111)
Creatinine, Ser: 1.22 mg/dL (ref 0.61–1.24)
GFR, Estimated: >60 mL/min (ref >60)
Glucose, Bld: 93 mg/dL (ref 70–99)
Potassium: 3.6 mmol/L (ref 3.5–5.1)
Sodium: 139 mmol/L (ref 135–145)
Total Bilirubin: 0.4 mg/dL (ref 0.0–1.2)
Total Protein: 5.7 g/dL — ABNORMAL LOW (ref 6.5–8.1)

## 2023-11-15 LAB — CBC WITH DIFFERENTIAL/PLATELET
Abs Immature Granulocytes: 0.12 10*3/uL — ABNORMAL HIGH (ref 0.00–0.07)
Basophils Absolute: 0.1 10*3/uL (ref 0.0–0.1)
Basophils Relative: 1 %
Eosinophils Absolute: 0.2 10*3/uL (ref 0.0–0.5)
Eosinophils Relative: 4 %
HCT: 31.2 % — ABNORMAL LOW (ref 39.0–52.0)
Hemoglobin: 10.1 g/dL — ABNORMAL LOW (ref 13.0–17.0)
Immature Granulocytes: 3 %
Lymphocytes Relative: 9 %
Lymphs Abs: 0.4 10*3/uL — ABNORMAL LOW (ref 0.7–4.0)
MCH: 30.9 pg (ref 26.0–34.0)
MCHC: 32.4 g/dL (ref 30.0–36.0)
MCV: 95.4 fL (ref 80.0–100.0)
Monocytes Absolute: 0.9 10*3/uL (ref 0.1–1.0)
Monocytes Relative: 19 %
Neutro Abs: 3.1 10*3/uL (ref 1.7–7.7)
Neutrophils Relative %: 64 %
Platelets: 80 10*3/uL — ABNORMAL LOW (ref 150–400)
RBC: 3.27 MIL/uL — ABNORMAL LOW (ref 4.22–5.81)
RDW: 14.4 % (ref 11.5–15.5)
WBC: 4.8 10*3/uL (ref 4.0–10.5)
nRBC: 0 % (ref 0.0–0.2)

## 2023-11-15 LAB — FERRITIN: Ferritin: 135 ng/mL (ref 24–336)

## 2023-11-15 LAB — VITAMIN B12: Vitamin B-12: 965 pg/mL — ABNORMAL HIGH (ref 180–914)

## 2023-11-15 LAB — MYCOPLASMA PNEUMONIAE ANTIBODY, IGM: Mycoplasma pneumo IgM: <770 U/mL (ref 0–769)

## 2023-11-15 LAB — FOLATE: Folate: 10.4 ng/mL (ref >5.9)

## 2023-11-15 LAB — PHOSPHORUS: Phosphorus: 2.8 mg/dL (ref 2.5–4.6)

## 2023-11-15 LAB — MAGNESIUM: Magnesium: 2.1 mg/dL (ref 1.7–2.4)

## 2023-11-15 MED ORDER — IPRATROPIUM BROMIDE 0.02 % IN SOLN
0.5000 mg | Freq: Two times a day (BID) | RESPIRATORY_TRACT | Status: DC
  Administered 2023-11-15 – 2023-11-16 (×2): 0.5 mg via RESPIRATORY_TRACT
  Filled 2023-11-15 (×2): qty 2.5

## 2023-11-15 MED ORDER — SODIUM CHLORIDE 0.9 % IV SOLN
8.0000 mg | Freq: Four times a day (QID) | INTRAVENOUS | Status: DC | PRN
  Administered 2023-11-15: 8 mg via INTRAVENOUS
  Filled 2023-11-15: qty 4

## 2023-11-15 MED ORDER — LEVALBUTEROL HCL 0.63 MG/3ML IN NEBU
0.6300 mg | INHALATION_SOLUTION | Freq: Two times a day (BID) | RESPIRATORY_TRACT | Status: DC
Start: 2023-11-15 — End: 2023-11-16
  Administered 2023-11-15: 0.63 mg via RESPIRATORY_TRACT

## 2023-11-15 NOTE — Progress Notes (Signed)
 SATURATION QUALIFICATIONS: (This note is used to comply with regulatory documentation for home oxygen)  Patient Saturations on Room Air at Rest = 94%  Patient Saturations on Room Air while Ambulating = 92%  Patient Saturations on 0 Liters of oxygen while Ambulating = N/A  Please briefly explain why patient needs home oxygen: Pt does not require oxygen while ambulating

## 2023-11-15 NOTE — Progress Notes (Signed)
 Bruce Smith says he feels better.  He is still coughing up purulent sputum.  This is being cultured.  The results are not back yet.  His blood cultures are negative.  He continues on antibiotics with Maxipime  and azithromycin .  The chest x-ray yesterday showed increasing infiltrate in the of the left upper lobe.  Some of this might be from the fact that his white cells are increasing secondary to the Neupogen  that he is getting.  His white cell count today is 4.8.  We will stop the Neupogen .  White cell count is 4.8.  Hemoglobin 10.1.  Platelet count 80,000.  Sodium 139.  Potassium 3.6.  BUN 21 creatinine 1.22.  Calcium  7.9 with an albumin  of 2.5.  He is using the incentive spirometer.  His appetite still is not that great.  He has a  little nausea but no vomiting.  His stools are a little bit loose.  There is no obvious diarrhea.  He has not had any fever.  His vital signs today are temperature 98.3.  Pulse 59.  Blood pressure 136/55.  His lungs sound better.  I do not hear as much wheezing.  Cardiac exam regular rate rhythm.  Abdomen is soft.  Bowel sounds are present.  There is no fluid wave.  There is no palpable liver or spleen tip.  Neurological exam is nonfocal.   He is had a chest x-ray today.  The results are not back yet.  Hopefully, he will be able to be put on an oral antibiotic and then be able to go home.  His immune system is better given that his white blood cell count is improved.  I know he is getting great care from all the staff up on 4 E.   Rayleen Cal, MD  Psalms 118:17

## 2023-11-15 NOTE — Progress Notes (Signed)
 Pharmacy Antibiotic Note  Bruce Smith is a 73 y.o. male admitted on 11/12/2023 with cough. Pharmacy has been consulted for Cefepime  dosing for febrile neutropenia/pneumonia.  Plan: Continue Cefepime  2g IV q8h Monitor renal function, cultures, clinical course  Height: 5\' 8"  (172.7 cm) Weight: 97.1 kg (214 lb 1.1 oz) IBW/kg (Calculated) : 68.4  Temp (24hrs), Avg:98.5 F (36.9 C), Min:98.3 F (36.8 C), Max:98.9 F (37.2 C)  Recent Labs  Lab 11/12/23 1228 11/12/23 1423 11/13/23 0456 11/14/23 0458 11/15/23 0516  WBC SPECIMEN CLOTTED NOTIFIED SOPHIE GOUGE RN 1350 11/12/23 CMR 1.2* 1.9* 3.0* 4.8  CREATININE 1.12  --  1.16 1.16 1.22  LATICACIDVEN 1.9  --   --   --   --     Estimated Creatinine Clearance: 61.9 mL/min (by C-G formula based on SCr of 1.22 mg/dL).    No Known Allergies  Antimicrobials this admission: 1/12 Ceftriaxone  x 1 1/12 Azithromycin  >> 1/12 Cefepime  >>  Microbiology results: 1/12 BCx: NGTD 1/12 COVID, Influenza, RSV negative 1/14 sputum: pending  Thank you for allowing pharmacy to be a part of this patient's care.  Alfredo Inch, PharmD, BCPS Clinical Pharmacist [facility] 11/15/2023 8:24 AM

## 2023-11-15 NOTE — Plan of Care (Signed)

## 2023-11-15 NOTE — Evaluation (Signed)
 Physical Therapy Brief Evaluation and Discharge Note Patient Details Name: Bruce Smith MRN: [id number] DOB: [birth year] Today's Date: 11/15/2023   History of Present Illness  Pt is a 73 y.o. male admitted to [facility] on 11/12/2023 by way of transfer from [facility] with sepsis due to multifocal community-acquired pneumonia after presenting from homecomplaining of new onset productive cough. PMH significant for multiple myeloma status post transplant currently on chemotherapy, coronary disease status post CABG in May 2024, essential pretension, and hyperlipidemia.   Clinical Impression  Pt is a 73 y.o. male with above HPI. Pt is typically modified independent and lives with spouse - has multiple family members available to assist if needed. Pt is currently supervision level for all mobility with O2 90% or greater with mobility on RA. Recommend home with family assist. No further skilled PT needs identified. Pt/family/RN encouraged for continued mobility with nursing staff and family outside of PT sessions- discussed use of RW as needed to maximize safety and comfort- all verbalized understanding.Pt is currently at adequate mobility level for d/c to home with family. PT will sign off. Please reconsult if mobility status changes/declines.               Equipment Recommendations None recommended by PT  Recommendations for Other Services       Precautions/Restrictions Precautions Precautions: None Restrictions Weight Bearing Restrictions Per Provider Order: No        Mobility  Bed Mobility          Transfers Overall transfer level: Needs assistance Equipment used: Rolling walker (2 wheels), None Transfers: Sit to/from Stand Sit to Stand: Supervision           General transfer comment: sit to stand x2 from EOB. initially with use of RW and with no AD and pushing IV pole following ambulation after rest break EOB then to recliner at end of session.     Ambulation/Gait Ambulation/Gait assistance: Supervision Gait Distance (Feet): 140 Feet Assistive device: Rolling walker (2 wheels), IV Pole Gait Pattern/deviations: Step-to pattern, Decreased stride length   General Gait Details: use of IV pole for majority of distance progressed to no AD for ~52ft at end with mild instability noted. Pt reports he utilizes [facility] most times inside  home and in community for balance. history of neuropathy.  Home Activity Instructions    Stairs            Modified Rankin (Stroke Patients Only)        Balance                          Pertinent Vitals/Pain   Pain Assessment Pain Assessment: Faces Faces Pain Scale: Hurts little more Pain Location: "all over" Pain Descriptors / Indicators: Sore Pain Intervention(s): Limited activity within patient's tolerance, Monitored during session, Repositioned     Home Living   Living Arrangements: Spouse/significant other       Home Equipment: Rollator (4 wheels);Cane - single Librarian, academic (2 wheels);BSC/3in1;Shower seat - built in;Grab bars - tub/shower;Hand held shower head;Wheelchair - manual        Prior Function        UE/LE Assessment               Communication   Communication Communication: No apparent difficulties     Cognition         General Comments General comments (skin integrity, edema, etc.): Pt with O2 low of 90% following toileting.  low observed during ambulation 92-93% throughout. RN updated.    Exercises     Assessment/Plan    PT Problem List         PT Visit Diagnosis Unsteadiness on feet (R26.81)    No Skilled PT Patient is supervision for all activity/mobility;Patient will have necessary level of assist by caregiver at discharge   Co-evaluation                AMPAC 6 Clicks Help needed turning from your back to your side while in a flat bed without using bedrails?: None Help needed moving from lying on your back to  sitting on the side of a flat bed without using bedrails?: None Help needed moving to and from a bed to a chair (including a wheelchair)?: A Little Help needed standing up from a chair using your arms (e.g., wheelchair or bedside chair)?: A Little Help needed to walk in hospital room?: A Little Help needed climbing 3-5 steps with a railing? : A Little 6 Click Score: 20      End of Session   Activity Tolerance: Patient tolerated treatment well Patient left: in chair;with call bell/phone within reach;with family/visitor present Nurse Communication: Mobility status;Other (comment) (O2 sats with mobility) PT Visit Diagnosis: Unsteadiness on feet (R26.81)     Time: 5621-3086 PT Time Calculation (min) (ACUTE ONLY): 28 min  Charges:   PT Evaluation $PT Eval Low Complexity: 1 Low PT Treatments $Therapeutic Activity: 8-22 mins    Faustine Hoof PT, DPT  Acute Rehabilitation Services  Office [phone]  11/15/2023, 1:18 PM

## 2023-11-15 NOTE — Progress Notes (Addendum)
 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  CT of the chest was suggestive of multifocal pneumonia along with a new lytic lesion in T4 consistent with progression of multiple myeloma. WBC count was 1.2 and neutrophils were 0.4.  The patient was started on ceftriaxone  and azithromycin .  Subjective:  He has a cough and mild dyspnea.   Assessment and Plan: Principal Problem:   CAP (community acquired pneumonia) in setting of neutropenia -CT scan revealed bilateral basilar opacities -Continue cefepime  and azithromycin   - CXR performed today- patchy b/l opacities, slightly progressed  Active Problems:    Multiple myeloma with pancytopenia - Patient was treated with Granix  and white count has improved to normal today - Appreciate oncology eval  Severe generalized weakness Acute on chronic back pain with new T4 lytic lesion - cont PRN Dilaudid , Tylenol  and Tramadol       Code Status: Full Code Total time on patient care: 35 minutes DVT prophylaxis:  SCDs Start: 11/12/23 1925     Objective:   Vitals:   11/15/23 0500 11/15/23 0551 11/15/23 0821 11/15/23 1218  BP:  (!) 136/55  (!) 118/58  Pulse:  (!) 59  (!) 57  Resp:  18  20  Temp:  98.3 F (36.8 C)  98 F (36.7 C)  TempSrc:  Oral  Oral  SpO2:  92% 92% 93%  Weight: 97.1 kg     Height:       Filed Weights   11/12/23 1200 11/15/23 0500  Weight: 94.5 kg 97.1 kg   Exam: General exam: Appears comfortable but fatigued/somnolent HEENT: oral mucosa dry Respiratory system: Rhonchi Cardiovascular system: S1 & S2 heard  Gastrointestinal  system: Abdomen soft, non-tender, nondistended. Normal bowel sounds   Extremities: No cyanosis, clubbing or edema Psychiatry:  Mood & affect appropriate.      CBC: Recent Labs  Lab 11/12/23 1228 11/12/23 1423 11/13/23 0456 11/14/23 0458 11/15/23 0516  WBC SPECIMEN CLOTTED NOTIFIED SOPHIE GOUGE RN 1350 11/12/23 CMR 1.2* 1.9* 3.0* 4.8  NEUTROABS PENDING 0.4* 0.9* 1.6* 3.1  HGB SPECIMEN CLOTTED NOTIFIED SOPHIE GOUGE RN 1350 11/12/23 CMR 11.5* 10.4* 10.3* 10.1*  HCT SPECIMEN CLOTTED NOTIFIED SOPHIE GOUGE RN 1350 11/12/23 CMR 35.4* 31.8* 33.1* 31.2*  MCV SPECIMEN CLOTTED NOTIFIED SOPHIE GOUGE RN 1350 11/12/23 CMR 93.2 94.9 98.2 95.4  PLT SPECIMEN CLOTTED NOTIFIED SOPHIE GOUGE RN 1350 11/12/23 CMR 63* 65* 75* 80*   Basic Metabolic Panel: Recent Labs  Lab 11/12/23 1228 11/13/23 0456 11/14/23 0458 11/15/23 0516  NA 136 139 134* 139  K 3.5 5.0 3.6 3.6  CL 107 109 109 111  CO2 21* 24 19* 21*  GLUCOSE 109* 98 91 93  BUN 27* 32* 28* 21  CREATININE 1.12 1.16 1.16 1.22  CALCIUM  8.5* 8.0* 7.6* 7.9*  MG  --  1.8 2.0 2.1  PHOS  --  3.6 2.7 2.8     Scheduled Meds:  arformoterol   15 mcg Nebulization BID   budesonide  (PULMICORT ) nebulizer solution  0.25 mg Nebulization BID   famciclovir   250 mg Oral Daily   guaiFENesin   1,200 mg Oral BID   ipratropium  0.5 mg Nebulization BID   levalbuterol   0.63 mg Nebulization BID   levETIRAcetam   500 mg Oral BID   lidocaine   1 patch Transdermal Q24H   pantoprazole   40 mg Oral BID   pregabalin   200 mg Oral BID   rosuvastatin   40 mg Oral Daily   saccharomyces boulardii  250 mg Oral Daily    Imaging and lab data personally reviewed   Author: Lexxi Koslow  11/15/2023 4:44 PM  To contact [facility]>   Check the care team in [facility] and look for the attending/consulting [facility] provider listed  Log into [email or web address] and use 's universal password   Go to> "[facility]"  and find provider  If you still have difficulty  reaching the provider, please page the [facility] (Director on Call) for the Hospitalists listed on amion

## 2023-11-16 ENCOUNTER — Inpatient Hospital Stay: Payer: Medicare Other

## 2023-11-16 ENCOUNTER — Other Ambulatory Visit: Payer: Self-pay | Admitting: Hematology & Oncology

## 2023-11-16 ENCOUNTER — Telehealth: Payer: Self-pay | Admitting: *Deleted

## 2023-11-16 ENCOUNTER — Inpatient Hospital Stay: Payer: Medicare Other | Admitting: Hematology & Oncology

## 2023-11-16 DIAGNOSIS — J189 Pneumonia, unspecified organism: Secondary | ICD-10-CM | POA: Diagnosis not present

## 2023-11-16 DIAGNOSIS — C9000 Multiple myeloma not having achieved remission: Secondary | ICD-10-CM | POA: Diagnosis not present

## 2023-11-16 LAB — CBC WITH DIFFERENTIAL/PLATELET
Abs Immature Granulocytes: 0.09 10*3/uL — ABNORMAL HIGH (ref 0.00–0.07)
Basophils Absolute: 0.1 10*3/uL (ref 0.0–0.1)
Basophils Relative: 1 %
Eosinophils Absolute: 0.2 10*3/uL (ref 0.0–0.5)
Eosinophils Relative: 4 %
HCT: 31.6 % — ABNORMAL LOW (ref 39.0–52.0)
Hemoglobin: 10 g/dL — ABNORMAL LOW (ref 13.0–17.0)
Immature Granulocytes: 2 %
Lymphocytes Relative: 10 %
Lymphs Abs: 0.6 10*3/uL — ABNORMAL LOW (ref 0.7–4.0)
MCH: 30.1 pg (ref 26.0–34.0)
MCHC: 31.6 g/dL (ref 30.0–36.0)
MCV: 95.2 fL (ref 80.0–100.0)
Monocytes Absolute: 0.9 10*3/uL (ref 0.1–1.0)
Monocytes Relative: 17 %
Neutro Abs: 3.6 10*3/uL (ref 1.7–7.7)
Neutrophils Relative %: 66 %
Platelets: 83 10*3/uL — ABNORMAL LOW (ref 150–400)
RBC: 3.32 MIL/uL — ABNORMAL LOW (ref 4.22–5.81)
RDW: 14.2 % (ref 11.5–15.5)
WBC: 5.4 10*3/uL (ref 4.0–10.5)
nRBC: 0.4 % — ABNORMAL HIGH (ref 0.0–0.2)

## 2023-11-16 LAB — COMPREHENSIVE METABOLIC PANEL
ALT: 42 U/L (ref 0–44)
AST: 30 U/L (ref 15–41)
Albumin: 2.5 g/dL — ABNORMAL LOW (ref 3.5–5.0)
Alkaline Phosphatase: 83 U/L (ref 38–126)
Anion gap: 6 (ref 5–15)
BUN: 19 mg/dL (ref 8–23)
CO2: 23 mmol/L (ref 22–32)
Calcium: 8.2 mg/dL — ABNORMAL LOW (ref 8.9–10.3)
Chloride: 110 mmol/L (ref 98–111)
Creatinine, Ser: 1.04 mg/dL (ref 0.61–1.24)
GFR, Estimated: >60 mL/min (ref >60)
Glucose, Bld: 95 mg/dL (ref 70–99)
Potassium: 3.7 mmol/L (ref 3.5–5.1)
Sodium: 139 mmol/L (ref 135–145)
Total Bilirubin: 0.6 mg/dL (ref 0.0–1.2)
Total Protein: 5.8 g/dL — ABNORMAL LOW (ref 6.5–8.1)

## 2023-11-16 MED ORDER — BENZONATATE 200 MG PO CAPS: 200.0000 mg | ORAL_CAPSULE | Freq: Three times a day (TID) | ORAL | 0 refills | Status: AC | PRN

## 2023-11-16 MED ORDER — CEFPODOXIME PROXETIL 200 MG PO TABS: 200.0000 mg | ORAL_TABLET | Freq: Two times a day (BID) | ORAL | 0 refills | Status: AC

## 2023-11-16 MED ORDER — SODIUM CHLORIDE 0.9 % IV SOLN
510.0000 mg | Freq: Once | INTRAVENOUS | Status: AC
  Administered 2023-11-16: 510 mg via INTRAVENOUS
  Filled 2023-11-16: qty 17

## 2023-11-16 MED ORDER — AZITHROMYCIN 500 MG PO TABS: 500.0000 mg | ORAL_TABLET | Freq: Every day | ORAL | 0 refills | Status: AC

## 2023-11-16 MED ORDER — GUAIFENESIN ER 600 MG PO TB12: 600.0000 mg | ORAL_TABLET | Freq: Two times a day (BID) | ORAL | 0 refills | Status: AC | PRN

## 2023-11-16 MED ORDER — POMALIDOMIDE 3 MG PO CAPS: 3.0000 mg | ORAL_CAPSULE | Freq: Every day | ORAL | 0 refills | Status: DC

## 2023-11-16 NOTE — Plan of Care (Addendum)
Patient possible DC home today per provider.   Problem: Education: Goal: Knowledge of General Education information will improve Description: Including pain rating scale, medication(s)/side effects and non-pharmacologic comfort measures Outcome: Progressing   Problem: Health Behavior/Discharge Planning: Goal: Ability to manage health-related needs will improve Outcome: Progressing   Problem: Clinical Measurements: Goal: Ability to maintain clinical measurements within normal limits will improve Outcome: Progressing Goal: Will remain free from infection Outcome: Progressing Goal: Diagnostic test results will improve Outcome: Progressing Goal: Respiratory complications will improve Outcome: Progressing Goal: Cardiovascular complication will be avoided Outcome: Progressing   Problem: Activity: Goal: Risk for activity intolerance will decrease Outcome: Progressing   Problem: Nutrition: Goal: Adequate nutrition will be maintained Outcome: Progressing   Problem: Coping: Goal: Level of anxiety will decrease Outcome: Progressing   Problem: Elimination: Goal: Will not experience complications related to bowel motility Outcome: Progressing Goal: Will not experience complications related to urinary retention Outcome: Progressing   Problem: Pain Management: Goal: General experience of comfort will improve Outcome: Progressing   Problem: Safety: Goal: Ability to remain free from injury will improve Outcome: Progressing   Problem: Skin Integrity: Goal: Risk for impaired skin integrity will decrease Outcome: Progressing   Problem: Fluid Volume: Goal: Hemodynamic stability will improve Outcome: Progressing   Problem: Clinical Measurements: Goal: Diagnostic test results will improve Outcome: Progressing Goal: Signs and symptoms of infection will decrease Outcome: Progressing   Problem: Respiratory: Goal: Ability to maintain adequate ventilation will improve Outcome:  Progressing

## 2023-11-16 NOTE — Plan of Care (Signed)
  Problem: Education: Goal: Knowledge of General Education information will improve Description: Including pain rating scale, medication(s)/side effects and non-pharmacologic comfort measures 11/16/2023 1252 by Westley Foots, RN Outcome: Adequate for Discharge 11/16/2023 1023 by Westley Foots, RN Outcome: Progressing   Problem: Health Behavior/Discharge Planning: Goal: Ability to manage health-related needs will improve 11/16/2023 1252 by Westley Foots, RN Outcome: Adequate for Discharge 11/16/2023 1023 by Westley Foots, RN Outcome: Progressing   Problem: Clinical Measurements: Goal: Ability to maintain clinical measurements within normal limits will improve 11/16/2023 1252 by Westley Foots, RN Outcome: Adequate for Discharge 11/16/2023 1023 by Westley Foots, RN Outcome: Progressing Goal: Will remain free from infection 11/16/2023 1252 by Westley Foots, RN Outcome: Adequate for Discharge 11/16/2023 1023 by Westley Foots, RN Outcome: Progressing Goal: Diagnostic test results will improve 11/16/2023 1252 by Westley Foots, RN Outcome: Adequate for Discharge 11/16/2023 1023 by Westley Foots, RN Outcome: Progressing Goal: Respiratory complications will improve 11/16/2023 1252 by Westley Foots, RN Outcome: Adequate for Discharge 11/16/2023 1023 by Westley Foots, RN Outcome: Progressing Goal: Cardiovascular complication will be avoided 11/16/2023 1252 by Westley Foots, RN Outcome: Adequate for Discharge 11/16/2023 1023 by Westley Foots, RN Outcome: Progressing   Problem: Activity: Goal: Risk for activity intolerance will decrease 11/16/2023 1252 by Westley Foots, RN Outcome: Adequate for Discharge 11/16/2023 1023 by Westley Foots, RN Outcome: Progressing   Problem: Nutrition: Goal: Adequate nutrition will be maintained 11/16/2023 1252 by Westley Foots, RN Outcome:  Adequate for Discharge 11/16/2023 1023 by Westley Foots, RN Outcome: Progressing   Problem: Coping: Goal: Level of anxiety will decrease 11/16/2023 1252 by Westley Foots, RN Outcome: Adequate for Discharge 11/16/2023 1023 by Westley Foots, RN Outcome: Progressing   Problem: Elimination: Goal: Will not experience complications related to bowel motility 11/16/2023 1252 by Westley Foots, RN Outcome: Adequate for Discharge 11/16/2023 1023 by Westley Foots, RN Outcome: Progressing Goal: Will not experience complications related to urinary retention 11/16/2023 1252 by Westley Foots, RN Outcome: Adequate for Discharge 11/16/2023 1023 by Westley Foots, RN Outcome: Progressing   Problem: Pain Management: Goal: General experience of comfort will improve 11/16/2023 1252 by Westley Foots, RN Outcome: Adequate for Discharge 11/16/2023 1023 by Westley Foots, RN Outcome: Progressing   Problem: Safety: Goal: Ability to remain free from injury will improve 11/16/2023 1252 by Westley Foots, RN Outcome: Adequate for Discharge 11/16/2023 1023 by Westley Foots, RN Outcome: Progressing   Problem: Skin Integrity: Goal: Risk for impaired skin integrity will decrease 11/16/2023 1252 by Westley Foots, RN Outcome: Adequate for Discharge 11/16/2023 1023 by Westley Foots, RN Outcome: Progressing   Problem: Fluid Volume: Goal: Hemodynamic stability will improve 11/16/2023 1252 by Westley Foots, RN Outcome: Adequate for Discharge 11/16/2023 1023 by Westley Foots, RN Outcome: Progressing   Problem: Clinical Measurements: Goal: Diagnostic test results will improve 11/16/2023 1252 by Westley Foots, RN Outcome: Adequate for Discharge 11/16/2023 1023 by Westley Foots, RN Outcome: Progressing Goal: Signs and symptoms of infection will decrease 11/16/2023 1252 by Westley Foots, RN Outcome:  Adequate for Discharge 11/16/2023 1023 by Westley Foots, RN Outcome: Progressing   Problem: Respiratory: Goal: Ability to maintain adequate ventilation will improve 11/16/2023 1252 by Westley Foots, RN Outcome: Adequate for Discharge 11/16/2023 1023 by Westley Foots, RN Outcome: Progressing

## 2023-11-16 NOTE — TOC Transition Note (Signed)
Transition of Care [facility]) - Discharge Note   Patient Details  Name: Bruce Smith MRN: [id number] Date of Birth: Aug 06, 1951  [facility] CM/SW Contact:  Lanier Clam, RN Phone Number: 11/16/2023, 12:44 PM   Clinical Narrative: d/c home.      Final next level of care: Home/Self Care Barriers to Discharge: No Barriers Identified   Patient Goals and CMS Choice Patient states their goals for this hospitalization and ongoing recovery are:: Home          Discharge Placement                       Discharge Plan and Services Additional resources added to the After Visit Summary for                                       Social Drivers of Health (SDOH) Interventions SDOH Screenings   Food Insecurity: No Food Insecurity (11/12/2023)  Housing: Low Risk  (11/12/2023)  Transportation Needs: No Transportation Needs (11/13/2023)  Utilities: Not At Risk (11/13/2023)  Financial Resource Strain: Low Risk  (03/02/2023)   Received from [facility]  Social Connections: Moderately Integrated (11/13/2023)  Tobacco Use: High Risk (11/12/2023)     Readmission Risk Interventions     No data to display

## 2023-11-16 NOTE — Progress Notes (Signed)
OT Cancellation Note  Patient Details Name: Bruce Smith MRN: [id number] DOB: 10/07/1951   Cancelled Treatment:    Reason Eval/Treat Not Completed: Other (comment) Chart reviewed, pt in bed with sister. States he is going home any minute. Does not have any OT needs at this time, all necessary DME at home. OT will complete orders and sign off.  Chatara Lucente L. Lizzy Hamre, OTR/L  11/16/23, 12:23 PM

## 2023-11-16 NOTE — Care Management Important Message (Signed)
Important Message  Patient Details IM Letter given. Name: Bruce Smith MRN: [id number] Date of Birth: 02/24/1951   Important Message Given:  Yes - Medicare IM     Caren Macadam 11/16/2023, 1:24 PM

## 2023-11-16 NOTE — Progress Notes (Signed)
Bruce Smith feels okay this morning.  The nebulizers certainly seem to help with his breathing.  He had a chest x-ray yesterday which did show that there appear to be some increasing infiltrate in the left lung.  Again, this might be secondary to his white cell count coming up after having the Neupogen.  He is iron deficient.  I think he would benefit from some IV iron.  I think for the iron deficiency, this could certainly cause his diaphragm to wear out more quickly.  His CBC shows white cell count of 5.4.  Hemoglobin 10.0.  Platelet count 83,000.  His sodium 139.  Potassium 3.7.  BUN 19 creatinine 1.04.  Calcium 8.2 with an albumin of 2.5.  He is eating a little better.  There is no nausea or vomiting.  He is going to the bathroom.  His sputum culture is still pending.  From what he says, he may go home today.  I think he is going need some oral antibiotics.  We will hold off on his treatment probably for a week or so so that it he can recover and recuperate from this pneumonia.    His vital signs are temperature 97.4.  Pulse 61.  Blood pressure 130/61.  His head neck exam shows no ocular or oral lesions.  His lungs sound relatively clear bilaterally.  I really do not hear much in the way of wheezing.  Cardiac exam regular rate and rhythm.  Abdomen soft.  Bowel sounds are present.  There is no fluid wave.  Extremity shows no clubbing, cyanosis or edema.  Again, Bruce Smith has pneumonia.  He is immunocompromised from his myeloma and his chemotherapy.  He did get IVIG.  Again, he continues to improve slowly but surely.  Again we will give him some IV iron.  Hopefully this will help with his diaphragmatic function.  I know he is getting incredible care from everybody up on 4 E.   [name],. MD  [place] 3:19

## 2023-11-16 NOTE — Discharge Summary (Addendum)
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   CT of the chest was suggestive of multifocal pneumonia along with a new lytic lesion in T4 consistent with progression of multiple myeloma. WBC count was 1.2 and neutrophils were 0.4.    The patient was started on Cefepime and azithromycin.  Principal Problem:   CAP (community acquired pneumonia) in setting of neutropenia  Sepsis -CT scan revealed bilateral basilar opacities - he feels well- has a mild cough but no dyspnea- able to ambulate in the halls - he still has mild LLL crackles on exam - will dc home with Cefpodaxime as he is immune compromised and Azithro   Active Problems:     Multiple myeloma with pancytopenia - Patient was treated with Granix and white count has improved to normal  - Appreciate oncology eval   New T4 lytic lesion - cont PRN Tylenol and Tramadol (outpt meds)  Morbid obesity  Body mass index is 31.98 kg/m.          Discharge Instructions  Discharge Instructions     Increase activity slowly   Complete by: As directed       Allergies as of 11/16/2023   No Known Allergies      Medication List     TAKE  these medications    acetaminophen 500 MG tablet Commonly known as: TYLENOL Take 1,000 mg by mouth every 4 (four) hours.   ALLERGY PO Take 10 mg by mouth daily.   aspirin EC 81 MG tablet Take 81 mg by mouth at bedtime.   azithromycin 500 MG tablet Commonly known as: Zithromax Take 1 tablet (500 mg total) by mouth daily for 1 day.   benzonatate 200 MG capsule Commonly known as: TESSALON Take 1 capsule (200 mg total) by mouth 3 (three) times daily as needed for cough.   CALCIUM 600 PO Take 600 mg by mouth daily.   cefpodoxime 200 MG tablet Commonly known as: VANTIN Take 1 tablet (200 mg total) by mouth 2 (two) times daily for 3 days.   dexamethasone 4 MG tablet Commonly known as: DECADRON Take 5 tablets (20 mg) for 1 day on the week after chemotherapy (day 22).   famciclovir 250 MG tablet Commonly known as: FAMVIR Take 1 tablet (250 mg total) by mouth daily.   guaiFENesin 600 MG 12 hr tablet Commonly known as: MUCINEX Take 1 tablet (600 mg total) by mouth 2 (two) times daily as needed for to loosen phlegm.   Ipratropium-Albuterol 20-100 MCG/ACT Aers respimat Commonly known as: COMBIVENT Inhale 1 puff into the lungs every 6 (six) hours as needed for wheezing.   levETIRAcetam 500 MG tablet Commonly known as: KEPPRA TAKE 1 TABLET BY MOUTH 2 TIMES A  DAY   Magnesium 250 MG Caps Take 250 mg by mouth daily.   metoprolol succinate 50 MG 24 hr tablet Commonly known as: TOPROL-XL Take 50 mg by mouth daily.   naloxone 4 MG/0.1ML Liqd nasal spray kit Commonly known as: Engineer, manufacturing systems as directed in each nostril in the event of an overdose. May repeat in 15 minutes.   nitroGLYCERIN 0.4 MG SL tablet Commonly known as: NITROSTAT Place 0.4 mg under the tongue every 5 (five) minutes as needed.   ondansetron 8 MG tablet Commonly known as: Zofran Take 1 tablet (8 mg total) by mouth every 8 (eight) hours as needed for nausea or vomiting.   pantoprazole 40 MG  tablet Commonly known as: PROTONIX TAKE 1 TABLET BY MOUTH 2 TIMES A DAY BEFORE MEALS FOR ACID REFLUX   polyethylene glycol powder 17 GM/SCOOP powder Commonly known as: GLYCOLAX/MIRALAX Take 17 g by mouth daily.   pomalidomide 3 MG capsule Commonly known as: Pomalyst Take 1 capsule (3 mg total) by mouth daily. Take 21 days on, 7 days off, repeat every 28 days. [employer] Auth # [id number] Date Obtained 11/07/23   pregabalin 200 MG capsule Commonly known as: LYRICA TAKE 1 CAPSULE BY MOUTH 2 TIMES DAILY   prochlorperazine 10 MG tablet Commonly known as: COMPAZINE Take 1 tablet (10 mg total) by mouth every 6 (six) hours as needed for nausea or vomiting.   rosuvastatin 40 MG tablet Commonly known as: CRESTOR TAKE 1 TABLET BY MOUTH EVERY DAY FOR CHOLESTEROL   saccharomyces boulardii 250 MG capsule Commonly known as: FLORASTOR Take 250 mg by mouth daily.   tamsulosin 0.4 MG Caps capsule Commonly known as: FLOMAX TAKE 1 CAPSULE BY MOUTH EVERY NIGHT AT BEDTIME What changed: when to take this   traMADol 50 MG tablet Commonly known as: ULTRAM TAKE 2 TABLETS BY MOUTH EVERY 6 HOURS ASNEEDED FOR PAIN What changed:  how much to take how to take this when to take this additional instructions            The results of significant diagnostics from this hospitalization (including imaging, microbiology, ancillary and laboratory) are listed below for reference.    DG CHEST PORT 1 VIEW Result Date: 11/15/2023 CLINICAL DATA:  141880 SOB (shortness of breath) [id number] EXAM: PORTABLE CHEST 1 VIEW COMPARISON:  11/14/2023, 11/12/2023 FINDINGS: Stable heart size status post sternotomy and CABG. Patchy bilateral airspace opacities, most pronounced within the bibasilar regions, slightly progressed. Left upper lobe scarring. No pleural effusion or pneumothorax. IMPRESSION: Patchy bilateral airspace opacities, slightly progressed. Electronically Signed   By: Duanne Guess D.O.   On: 11/15/2023 09:30    DG CHEST PORT 1 VIEW Result Date: 11/14/2023 CLINICAL DATA:  Shortness of breath EXAM: PORTABLE CHEST 1 VIEW COMPARISON:  11/12/2023 FINDINGS: Prior CABG. Heart and mediastinal contours within normal limits. Patchy left upper lobe airspace opacity is increased since prior study. No confluent opacity on the right. No effusions or acute bony abnormality. IMPRESSION: Increasing patchy left upper lobe airspace disease. Electronically Signed   By: Charlett Nose M.D.   On: 11/14/2023 09:32   CT Angio Chest PE W and/or Wo Contrast Result Date: 11/12/2023 CLINICAL DATA:  Cough, chills, and weakness. Currently on chemotherapy for multiple myeloma. Nodule seen on chest x-ray. EXAM: CT ANGIOGRAPHY CHEST WITH CONTRAST TECHNIQUE: Multidetector CT imaging of the chest was performed using the standard protocol during bolus administration of intravenous contrast. Multiplanar CT image reconstructions and MIPs were obtained to evaluate the vascular anatomy. RADIATION DOSE  REDUCTION: This exam was performed according to the departmental dose-optimization program which includes automated exposure control, adjustment of the mA and/or kV according to patient size and/or use of iterative reconstruction technique. CONTRAST:  OMNIPAQUE IOHEXOL 350 MG/ML SOLN COMPARISON:  Chest x-ray from same day. PET-CT dated September 26, 2023. CT chest dated August 03, 2023. FINDINGS: Cardiovascular: Satisfactory opacification of the pulmonary arteries to the segmental level. No evidence of pulmonary embolism. Normal heart size status post CABG. No pericardial effusion. No thoracic aortic aneurysm or dissection. Coronary, aortic arch, and branch vessel atherosclerotic vascular disease. Mediastinum/Nodes: Left AP window lymph node currently measures 1.4 cm in short axis, previously 1.8 cm. No other enlarged mediastinal or hilar lymph node. No enlarged axillary lymph nodes. Thyroid gland, trachea, and esophagus demonstrate no significant  findings. Lungs/Pleura: New patchy irregular ground-glass densities primarily in the left lower lobe, with additional small, more dense consolidative opacity in the medial right lower lobe. Unchanged scarring in the left upper lobe. Scattered small pulmonary nodules in both lungs measuring up to 7 mm are unchanged. Upper Abdomen: No acute abnormality. Musculoskeletal: No chest wall abnormality. New lytic lesion in the T4 vertebral body (series 602, image 104). Other treated mixed sclerotic and lucent lesions within multiple thoracic vertebral bodies are unchanged. Old fracture deformity of the left scapular body again noted. Review of the MIP images confirms the above findings. IMPRESSION: 1. No evidence of pulmonary embolism. 2. New patchy irregular ground-glass densities primarily in the left lower lobe, with additional small, more dense consolidative opacity in the medial right lower lobe, consistent with multifocal pneumonia. This accounts for the density seen on chest x-ray. 3. New lytic lesion in the T4 vertebral body, consistent with progression of multiple myeloma. 4.  Aortic Atherosclerosis (ICD10-I70.0). Electronically Signed   By: Obie Dredge M.D.   On: 11/12/2023 16:30   DG Chest Port 1 View Result Date: 11/12/2023 CLINICAL DATA:  Cough, chills, and weakness. Currently on chemotherapy for multiple myeloma. EXAM: PORTABLE CHEST 1 VIEW COMPARISON:  PET-CT dated September 26, 2023. Chest x-ray dated August 02, 2023. FINDINGS: The heart size and mediastinal contours are within normal limits. Prior CABG. Normal pulmonary vascularity. Similar scarring in the left upper lobe. 2.9 cm spiculated nodule in the mid left lung, not seen on the prior x-ray and without clear correlate on recent PET-CT. No focal consolidation, pleural effusion, or pneumothorax. No acute osseous abnormality. Chronic fracture deformity of the left scapula again noted. IMPRESSION: 1. 2.9 cm spiculated nodule in the mid left lung, not  seen on the prior x-ray and without clear correlate on recent PET-CT. Differential considerations include infection or malignancy. Recommend further evaluation with chest CT. Electronically Signed   By: Obie Dredge M.D.   On: 11/12/2023 12:33   Labs:   Basic Metabolic Panel: Recent Labs  Lab 11/12/23 1228 11/13/23 0456 11/14/23 0458 11/15/23 0516 11/16/23 0459  NA 136 139 134* 139 139  K 3.5 5.0 3.6 3.6 3.7  CL 107 109 109 111 110  CO2 21* 24 19* 21* 23  GLUCOSE 109* 98 91 93 95  BUN 27* 32* 28* 21 19  CREATININE 1.12 1.16 1.16 1.22 1.04  CALCIUM 8.5* 8.0* 7.6* 7.9* 8.2*  MG  --  1.8 2.0 2.1  --   PHOS  --  3.6 2.7 2.8  --      CBC: Recent Labs  Lab 11/12/23 1423 11/13/23 0456 11/14/23 0458 11/15/23 0516 11/16/23 0459  WBC 1.2* 1.9* 3.0* 4.8  5.4  NEUTROABS 0.4* 0.9* 1.6* 3.1 3.6  HGB 11.5* 10.4* 10.3* 10.1* 10.0*  HCT 35.4* 31.8* 33.1* 31.2* 31.6*  MCV 93.2 94.9 98.2 95.4 95.2  PLT 63* 65* 75* 80* 83*         SIGNED:   Calvert Cantor, MD  [facility] 11/16/2023, 12:45 PM

## 2023-11-16 NOTE — Telephone Encounter (Signed)
I returned spouse's phone call regarding Pomalyst. Per Dr. Myna Hidalgo, [facility] Pomalyst for one week. Restart it on Friday, January 24th. Dr. Myna Hidalgo would like to see him on Thursday, January, 30th for labs/ MD/ infusion. Scheduling will work on this tomorrow. She verbalized understanding.

## 2023-11-17 LAB — CULTURE, BLOOD (ROUTINE X 2)
Culture: NO GROWTH
Culture: NO GROWTH
Special Requests: ADEQUATE

## 2023-11-17 LAB — CULTURE, RESPIRATORY W GRAM STAIN: Culture: NORMAL

## 2023-11-19 ENCOUNTER — Other Ambulatory Visit: Payer: Self-pay

## 2023-11-24 ENCOUNTER — Other Ambulatory Visit: Payer: Self-pay | Admitting: Hematology & Oncology

## 2023-11-27 ENCOUNTER — Telehealth: Payer: Self-pay

## 2023-11-27 NOTE — Telephone Encounter (Signed)
Received call from Lauren RN from [facility] CAR-T program wanting the update Dr Myna Hidalgo with the expected schedule for CAR-T & treatment here.  Per Bruce Smith, pt to continue Kyprolis/Pomalyst for this cycle as scheduled for 1/30 - 12/16/2023. Pt will have cells collected for CAR-T procedure. Pt will then need to resume Kyprolis/Pomalyst the week of 01/01/2024. Dr Myna Hidalgo aware. dph

## 2023-11-30 ENCOUNTER — Encounter: Payer: Self-pay | Admitting: Hematology & Oncology

## 2023-11-30 ENCOUNTER — Other Ambulatory Visit: Payer: Self-pay

## 2023-11-30 ENCOUNTER — Inpatient Hospital Stay: Payer: Medicare Other

## 2023-11-30 ENCOUNTER — Inpatient Hospital Stay: Payer: Medicare Other | Admitting: Hematology & Oncology

## 2023-11-30 VITALS — BP 127/60 | HR 57 | Temp 98.0°F | Resp 18 | Ht 68.0 in | Wt 201.0 lb

## 2023-11-30 VITALS — BP 122/53 | HR 61 | Temp 97.7°F | Resp 18

## 2023-11-30 DIAGNOSIS — M545 Low back pain, unspecified: Secondary | ICD-10-CM | POA: Insufficient documentation

## 2023-11-30 DIAGNOSIS — Z5111 Encounter for antineoplastic chemotherapy: Secondary | ICD-10-CM | POA: Diagnosis present

## 2023-11-30 DIAGNOSIS — C9001 Multiple myeloma in remission: Secondary | ICD-10-CM

## 2023-11-30 DIAGNOSIS — C9000 Multiple myeloma not having achieved remission: Secondary | ICD-10-CM | POA: Diagnosis not present

## 2023-11-30 DIAGNOSIS — M79605 Pain in left leg: Secondary | ICD-10-CM | POA: Insufficient documentation

## 2023-11-30 DIAGNOSIS — Z9484 Stem cells transplant status: Secondary | ICD-10-CM | POA: Diagnosis not present

## 2023-11-30 DIAGNOSIS — M5116 Intervertebral disc disorders with radiculopathy, lumbar region: Secondary | ICD-10-CM | POA: Insufficient documentation

## 2023-11-30 DIAGNOSIS — M25519 Pain in unspecified shoulder: Secondary | ICD-10-CM | POA: Insufficient documentation

## 2023-11-30 LAB — CBC WITH DIFFERENTIAL (CANCER CENTER ONLY)
Abs Immature Granulocytes: 0.01 10*3/uL (ref 0.00–0.07)
Basophils Absolute: 0.1 10*3/uL (ref 0.0–0.1)
Basophils Relative: 1 %
Eosinophils Absolute: 0.3 10*3/uL (ref 0.0–0.5)
Eosinophils Relative: 8 %
HCT: 36 % — ABNORMAL LOW (ref 39.0–52.0)
Hemoglobin: 11.6 g/dL — ABNORMAL LOW (ref 13.0–17.0)
Immature Granulocytes: 0 %
Lymphocytes Relative: 24 %
Lymphs Abs: 0.9 10*3/uL (ref 0.7–4.0)
MCH: 31 pg (ref 26.0–34.0)
MCHC: 32.2 g/dL (ref 30.0–36.0)
MCV: 96.3 fL (ref 80.0–100.0)
Monocytes Absolute: 0.9 10*3/uL (ref 0.1–1.0)
Monocytes Relative: 23 %
Neutro Abs: 1.6 10*3/uL — ABNORMAL LOW (ref 1.7–7.7)
Neutrophils Relative %: 44 %
Platelet Count: 164 10*3/uL (ref 150–400)
RBC: 3.74 MIL/uL — ABNORMAL LOW (ref 4.22–5.81)
RDW: 15.5 % (ref 11.5–15.5)
WBC Count: 3.8 10*3/uL — ABNORMAL LOW (ref 4.0–10.5)
nRBC: 0 % (ref 0.0–0.2)

## 2023-11-30 LAB — CMP (CANCER CENTER ONLY)
ALT: 22 U/L (ref 0–44)
AST: 17 U/L (ref 15–41)
Albumin: 3.7 g/dL (ref 3.5–5.0)
Alkaline Phosphatase: 83 U/L (ref 38–126)
Anion gap: 7 (ref 5–15)
BUN: 17 mg/dL (ref 8–23)
CO2: 25 mmol/L (ref 22–32)
Calcium: 9.1 mg/dL (ref 8.9–10.3)
Chloride: 106 mmol/L (ref 98–111)
Creatinine: 1.21 mg/dL (ref 0.61–1.24)
GFR, Estimated: >60 mL/min (ref >60)
Glucose, Bld: 112 mg/dL — ABNORMAL HIGH (ref 70–99)
Potassium: 4 mmol/L (ref 3.5–5.1)
Sodium: 138 mmol/L (ref 135–145)
Total Bilirubin: 0.3 mg/dL (ref 0.0–1.2)
Total Protein: 6.1 g/dL — ABNORMAL LOW (ref 6.5–8.1)

## 2023-11-30 MED ORDER — SODIUM CHLORIDE 0.9 % IV SOLN: Freq: Once | INTRAVENOUS | Status: DC

## 2023-11-30 MED ORDER — SODIUM CHLORIDE 0.9 % IV SOLN: INTRAVENOUS | Status: DC

## 2023-11-30 MED ORDER — DEXTROSE 5 % IV SOLN
36.0000 mg/m2 | Freq: Once | INTRAVENOUS | Status: AC
  Administered 2023-11-30: 80 mg via INTRAVENOUS
  Filled 2023-11-30: qty 30

## 2023-11-30 MED ORDER — DEXAMETHASONE SODIUM PHOSPHATE 100 MG/10ML IJ SOLN
20.0000 mg | Freq: Once | INTRAMUSCULAR | Status: AC
  Administered 2023-11-30: 20 mg via INTRAVENOUS
  Filled 2023-11-30: qty 20

## 2023-11-30 NOTE — Patient Instructions (Signed)
[facility] - A DEPT OF [facility]  Discharge Instructions: Thank you for choosing [facility] to provide your oncology and hematology care.   If you have a lab appointment with the Cancer Center, please go directly to the Cancer Center and check in at the registration area.  Wear comfortable clothing and clothing appropriate for easy access to any Portacath or PICC line.   We strive to give you quality time with your provider. You may need to reschedule your appointment if you arrive late (15 or more minutes).  Arriving late affects you and other patients whose appointments are after yours.  Also, if you miss three or more appointments without notifying the office, you may be dismissed from the clinic at the provider's discretion.      For prescription refill requests, have your pharmacy contact our office and allow 72 hours for refills to be completed.    Today you received the following chemotherapy and/or immunotherapy agents:  Kyprolis      To help prevent nausea and vomiting after your treatment, we encourage you to take your nausea medication as directed.  BELOW ARE SYMPTOMS THAT SHOULD BE REPORTED IMMEDIATELY: *FEVER GREATER THAN 100.4 F (38 C) OR HIGHER *CHILLS OR SWEATING *NAUSEA AND VOMITING THAT IS NOT CONTROLLED WITH YOUR NAUSEA MEDICATION *UNUSUAL SHORTNESS OF BREATH *UNUSUAL BRUISING OR BLEEDING *URINARY PROBLEMS (pain or burning when urinating, or frequent urination) *BOWEL PROBLEMS (unusual diarrhea, constipation, pain near the anus) TENDERNESS IN MOUTH AND THROAT WITH OR WITHOUT PRESENCE OF ULCERS (sore throat, sores in mouth, or a toothache) UNUSUAL RASH, SWELLING OR PAIN  UNUSUAL VAGINAL DISCHARGE OR ITCHING   Items with * indicate a potential emergency and should be followed up as soon as possible or go to the Emergency Department if any problems should occur.  Please show the CHEMOTHERAPY ALERT CARD or IMMUNOTHERAPY  ALERT CARD at check-in to the Emergency Department and triage nurse. Should you have questions after your visit or need to cancel or reschedule your appointment, please contact [facility] - A DEPT OF [facility]  [phone] and follow the prompts.  Office hours are 8:00 a.m. to 4:30 p.m. Monday - Friday. Please note that voicemails left after 4:00 p.m. may not be returned until the following business day.  We are closed weekends and major holidays. You have access to a nurse at all times for urgent questions. Please call the main number to the clinic [phone] and follow the prompts.  For any non-urgent questions, you may also contact your provider using MyChart. We now offer e-Visits for anyone 25 and older to request care online for non-urgent symptoms. For details visit [email or web address].   Also download the MyChart app! Go to the app store, search "MyChart", open the app, select [facility], and log in with your MyChart username and password.

## 2023-11-30 NOTE — Progress Notes (Signed)
[facility] Hematology and Oncology Follow Up Visit  Bruce Smith [id number] 01-07-1951 73 y.o. 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 - t(11:14), 13q-, 1p-,1q-  Past Therapy: Status post surgical repair of left femur-07/14/2020 XRT to left shoulder and left hip Xgeva 120 mg subcu every 3 months-next dose in December 2022  Faspro/Velcade/Revlimid/Decadron -- s/p cycle #2 -- start on       08/06/2020  ( Revlimid is 14 on /14 off)    Current Therapy:        ASCT -- [facility] on -01/22/2021 Faspro - q month -- maintenance -- start on 05/13/2021 -- d/c on 10/10/2023 Zometa 4 mg IV every 3 months-next dose in 12/2023  Radiation therapy to T12, left iliac and left humerus --completed on 10/04/2022 --XRT to right femoral head -start 01/03/2023 XRT to T9 lesion -to be completed on 06/18/2023 Kyprolis/Pomalyst -- s/p cycle #1 - start on 10/19/2023    Interim History:  Bruce Smith is here today for follow-up.  Bruce Smith had her neurosurgery I think a week or so ago.  This really helped.  She is having problems with her facial nerve.  A procedure was done that helped take pressure off the facial nerve.  Bruce Smith is doing great.  He is getting ready for Bruce CAR-T therapy.  I think there will collect cells in about 2 weeks or so.  He was in the hospital recently with pneumonia.  He is got through this.  He has had no problems with appetite.  He has had no nausea or vomiting.  He has had no change in bowel or bladder habits.  When we last checked Bruce myeloma studies, there is no monoclonal spike in Bruce blood.  Bruce kappa light chain was 1.3 mg/dL.  Of note, Bruce IgG level was 400 mg/dL back in December.  I know while he is in the hospital, he did get IVIG.  We may have to continue him on IVIG.  Currently, I would say that Bruce performance status is probably ECOG 1.  Medications:  Allergies as  of 11/30/2023   No Known Allergies      Medication List        Accurate as of November 30, 2023 11:01 AM. If you have any questions, ask your nurse or doctor.          acetaminophen 500 MG tablet Commonly known as: TYLENOL Take 1,000 mg by mouth every 4 (four) hours.   ALLERGY PO Take 10 mg by mouth daily.   aspirin EC 81 MG tablet Take 81 mg by mouth at bedtime.   benzonatate 200 MG capsule Commonly known as: TESSALON Take 1 capsule (200 mg total) by mouth 3 (three) times daily as needed for cough.   CALCIUM 600 PO Take 600 mg by mouth daily.   dexamethasone 4 MG tablet Commonly known as: DECADRON Take 5 tablets (20 mg) for 1 day on the week after chemotherapy (day 22).   famciclovir 250 MG tablet Commonly known as: FAMVIR Take 1 tablet (250 mg total) by mouth daily.   guaiFENesin 600 MG 12 hr tablet Commonly known as: MUCINEX Take 1 tablet (600 mg total) by mouth 2 (two) times daily as needed for to loosen phlegm.   Ipratropium-Albuterol 20-100 MCG/ACT Aers respimat Commonly known as: COMBIVENT Inhale 1 puff into the lungs every 6 (six) hours as needed for wheezing.   levETIRAcetam 500 MG  tablet Commonly known as: KEPPRA TAKE 1 TABLET BY MOUTH 2 TIMES A DAY   Magnesium 250 MG Caps Take 250 mg by mouth daily.   metoprolol succinate 50 MG 24 hr tablet Commonly known as: TOPROL-XL Take 50 mg by mouth daily.   naloxone 4 MG/0.1ML Liqd nasal spray kit Commonly known as: Engineer, manufacturing systems as directed in each nostril in the event of an overdose. May repeat in 15 minutes.   nitroGLYCERIN 0.4 MG SL tablet Commonly known as: NITROSTAT Place 0.4 mg under the tongue every 5 (five) minutes as needed.   ondansetron 8 MG tablet Commonly known as: Zofran Take 1 tablet (8 mg total) by mouth every 8 (eight) hours as needed for nausea or vomiting.   pantoprazole 40 MG tablet Commonly known as: PROTONIX TAKE 1 TABLET BY MOUTH 2 TIMES A DAY BEFORE MEALS FOR ACID  REFLUX   polyethylene glycol powder 17 GM/SCOOP powder Commonly known as: GLYCOLAX/MIRALAX Take 17 g by mouth daily.   pomalidomide 3 MG capsule Commonly known as: Pomalyst Take 1 capsule (3 mg total) by mouth daily. Take 21 days on, 7 days off, repeat every 28 days. [employer] Auth # [id number] Date Obtained 11/07/23   pregabalin 200 MG capsule Commonly known as: LYRICA TAKE 1 CAPSULE BY MOUTH 2 TIMES DAILY   prochlorperazine 10 MG tablet Commonly known as: COMPAZINE Take 1 tablet (10 mg total) by mouth every 6 (six) hours as needed for nausea or vomiting.   rosuvastatin 40 MG tablet Commonly known as: CRESTOR TAKE 1 TABLET BY MOUTH EVERY DAY FOR CHOLESTEROL   saccharomyces boulardii 250 MG capsule Commonly known as: FLORASTOR Take 250 mg by mouth daily.   tamsulosin 0.4 MG Caps capsule Commonly known as: FLOMAX TAKE 1 CAPSULE BY MOUTH EVERY NIGHT AT BEDTIME What changed: when to take this   traMADol 50 MG tablet Commonly known as: ULTRAM TAKE 2 TABLETS BY MOUTH EVERY 6 HOURS ASNEEDED FOR PAIN What changed:  how much to take how to take this when to take this additional instructions        Allergies: No Known Allergies  Past Medical History, Surgical history, Social history, and Family History were reviewed and updated.  Review of Systems: Review of Systems  Constitutional: Negative.   HENT: Negative.    Eyes: Negative.   Respiratory: Negative.    Cardiovascular: Negative.   Gastrointestinal: Negative.   Genitourinary: Negative.   Musculoskeletal:  Positive for joint pain.  Skin: Negative.   Neurological: Negative.   Endo/Heme/Allergies: Negative.   Psychiatric/Behavioral: Negative.       Physical Exam:  height is 5\' 8"  (1.727 m) and weight is 201 lb (91.2 kg). Bruce oral temperature is 98 F (36.7 C). Bruce blood pressure is 127/60 and Bruce pulse is 57 (abnormal). Bruce respiration is 18 and oxygen saturation is 99%.   Wt Readings from Last 3 Encounters:   11/30/23 201 lb (91.2 kg)  11/16/23 210 lb 5.1 oz (95.4 kg)  10/10/23 208 lb (94.3 kg)    Physical Exam Vitals reviewed.  Constitutional:      Comments: This is a well-developed and well-nourished white male in no obvious distress.  He has a nicely healed median sternotomy scar.  HENT:     Head: Normocephalic and atraumatic.  Eyes:     Pupils: Pupils are equal, round, and reactive to light.  Cardiovascular:     Rate and Rhythm: Normal rate and regular rhythm.     Heart sounds: Normal heart sounds.  Pulmonary:     Effort: Pulmonary effort is normal.     Breath sounds: Normal breath sounds.  Abdominal:     General: Bowel sounds are normal.     Palpations: Abdomen is soft.  Musculoskeletal:        General: No tenderness or deformity. Normal range of motion.     Cervical back: Normal range of motion.     Comments: There is pain to palpation in the left hip area.  This is in the lateral hip area.  He has some decreased range of motion.  Lymphadenopathy:     Cervical: No cervical adenopathy.  Skin:    General: Skin is warm and dry.     Findings: No erythema or rash.  Neurological:     Mental Status: He is alert and oriented to person, place, and time.  Psychiatric:        Behavior: Behavior normal.        Thought Content: Thought content normal.        Judgment: Judgment normal.      Lab Results  Component Value Date   WBC 3.8 (L) 11/30/2023   HGB 11.6 (L) 11/30/2023   HCT 36.0 (L) 11/30/2023   MCV 96.3 11/30/2023   PLT 164 11/30/2023   Lab Results  Component Value Date   FERRITIN 135 11/15/2023   IRON 35 (L) 11/15/2023   TIBC 225 (L) 11/15/2023   UIBC 190 11/15/2023   IRONPCTSAT 16 (L) 11/15/2023   Lab Results  Component Value Date   RETICCTPCT 1.3 11/14/2023   RBC 3.74 (L) 11/30/2023   Lab Results  Component Value Date   KPAFRELGTCHN 12.8 10/10/2023   LAMBDASER 5.6 (L) 10/10/2023   KAPLAMBRATIO 2.29 (H) 10/10/2023   Lab Results  Component Value Date    IGGSERUM 397 (L) 10/10/2023   IGA 10 (L) 10/10/2023   IGMSERUM <5 (L) 10/10/2023   Lab Results  Component Value Date   TOTALPROTELP 5.6 (L) 10/10/2023   ALBUMINELP 3.3 10/10/2023   A1GS 0.3 10/10/2023   A2GS 0.8 10/10/2023   BETS 0.8 10/10/2023   GAMS 0.4 10/10/2023   MSPIKE Not Observed 10/10/2023     Chemistry      Component Value Date/Time   NA 138 11/30/2023 1008   K 4.0 11/30/2023 1008   CL 106 11/30/2023 1008   CO2 25 11/30/2023 1008   BUN 17 11/30/2023 1008   CREATININE 1.21 11/30/2023 1008      Component Value Date/Time   CALCIUM 9.1 11/30/2023 1008   ALKPHOS 83 11/30/2023 1008   AST 17 11/30/2023 1008   ALT 22 11/30/2023 1008   BILITOT 0.3 11/30/2023 1008       Impression and Plan: Mr. Glasscock is a very pleasant 74 yo gentleman with 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   We will continue him on the pomalidomide/Kyprolis.  I know that [facility] will collect Bruce lymphocytes in February.  I am sure that they were there will give [place] a schedule.  I will plan to see him back myself on 12/28/2023.  I need to make sure that he gets IVIG next week.   Josph Macho, MD 1/30/202511:01 AM

## 2023-12-01 ENCOUNTER — Other Ambulatory Visit: Payer: Self-pay

## 2023-12-01 ENCOUNTER — Other Ambulatory Visit: Payer: Self-pay | Admitting: *Deleted

## 2023-12-01 DIAGNOSIS — C9000 Multiple myeloma not having achieved remission: Secondary | ICD-10-CM

## 2023-12-01 LAB — IGG, IGA, IGM
IgA: 21 mg/dL — ABNORMAL LOW (ref 61–437)
IgG (Immunoglobin G), Serum: 793 mg/dL (ref 603–1613)
IgM (Immunoglobulin M), Srm: 16 mg/dL (ref 15–143)

## 2023-12-01 MED ORDER — POMALIDOMIDE 3 MG PO CAPS: 3.0000 mg | ORAL_CAPSULE | Freq: Every day | ORAL | 0 refills | Status: DC

## 2023-12-04 LAB — KAPPA/LAMBDA LIGHT CHAINS
Kappa free light chain: 12.6 mg/L (ref 3.3–19.4)
Kappa, lambda light chain ratio: 3.41 — ABNORMAL HIGH (ref 0.26–1.65)
Lambda free light chains: 3.7 mg/L — ABNORMAL LOW (ref 5.7–26.3)

## 2023-12-06 LAB — PROTEIN ELECTROPHORESIS, SERUM, WITH REFLEX
A/G Ratio: 1.2 (ref 0.7–1.7)
Albumin ELP: 3.1 g/dL (ref 2.9–4.4)
Alpha-1-Globulin: 0.3 g/dL (ref 0.0–0.4)
Alpha-2-Globulin: 0.8 g/dL (ref 0.4–1.0)
Beta Globulin: 0.7 g/dL (ref 0.7–1.3)
Gamma Globulin: 0.7 g/dL (ref 0.4–1.8)
Globulin, Total: 2.5 g/dL (ref 2.2–3.9)
Total Protein ELP: 5.6 g/dL — ABNORMAL LOW (ref 6.0–8.5)

## 2023-12-07 ENCOUNTER — Inpatient Hospital Stay: Payer: Medicare Other | Attending: Hematology & Oncology

## 2023-12-07 ENCOUNTER — Inpatient Hospital Stay: Payer: Medicare Other

## 2023-12-07 VITALS — BP 104/47 | HR 60 | Temp 97.9°F | Resp 17

## 2023-12-07 DIAGNOSIS — C9000 Multiple myeloma not having achieved remission: Secondary | ICD-10-CM | POA: Insufficient documentation

## 2023-12-07 DIAGNOSIS — C419 Malignant neoplasm of bone and articular cartilage, unspecified: Secondary | ICD-10-CM

## 2023-12-07 DIAGNOSIS — Z5111 Encounter for antineoplastic chemotherapy: Secondary | ICD-10-CM | POA: Insufficient documentation

## 2023-12-07 DIAGNOSIS — C9001 Multiple myeloma in remission: Secondary | ICD-10-CM

## 2023-12-07 LAB — CBC WITH DIFFERENTIAL (CANCER CENTER ONLY)
Abs Immature Granulocytes: 0.05 10*3/uL (ref 0.00–0.07)
Basophils Absolute: 0 10*3/uL (ref 0.0–0.1)
Basophils Relative: 0 %
Eosinophils Absolute: 0.6 10*3/uL — ABNORMAL HIGH (ref 0.0–0.5)
Eosinophils Relative: 12 %
HCT: 37.7 % — ABNORMAL LOW (ref 39.0–52.0)
Hemoglobin: 12.4 g/dL — ABNORMAL LOW (ref 13.0–17.0)
Immature Granulocytes: 1 %
Lymphocytes Relative: 12 %
Lymphs Abs: 0.6 10*3/uL — ABNORMAL LOW (ref 0.7–4.0)
MCH: 31.5 pg (ref 26.0–34.0)
MCHC: 32.9 g/dL (ref 30.0–36.0)
MCV: 95.7 fL (ref 80.0–100.0)
Monocytes Absolute: 0.9 10*3/uL (ref 0.1–1.0)
Monocytes Relative: 18 %
Neutro Abs: 2.8 10*3/uL (ref 1.7–7.7)
Neutrophils Relative %: 57 %
Platelet Count: 100 10*3/uL — ABNORMAL LOW (ref 150–400)
RBC: 3.94 MIL/uL — ABNORMAL LOW (ref 4.22–5.81)
RDW: 15.8 % — ABNORMAL HIGH (ref 11.5–15.5)
WBC Count: 4.9 10*3/uL (ref 4.0–10.5)
nRBC: 0 % (ref 0.0–0.2)

## 2023-12-07 LAB — CMP (CANCER CENTER ONLY)
ALT: 41 U/L (ref 0–44)
AST: 19 U/L (ref 15–41)
Albumin: 3.8 g/dL (ref 3.5–5.0)
Alkaline Phosphatase: 93 U/L (ref 38–126)
Anion gap: 7 (ref 5–15)
BUN: 16 mg/dL (ref 8–23)
CO2: 28 mmol/L (ref 22–32)
Calcium: 10.2 mg/dL (ref 8.9–10.3)
Chloride: 104 mmol/L (ref 98–111)
Creatinine: 1.22 mg/dL (ref 0.61–1.24)
GFR, Estimated: >60 mL/min (ref >60)
Glucose, Bld: 108 mg/dL — ABNORMAL HIGH (ref 70–99)
Potassium: 4.4 mmol/L (ref 3.5–5.1)
Sodium: 139 mmol/L (ref 135–145)
Total Bilirubin: 0.4 mg/dL (ref 0.0–1.2)
Total Protein: 5.9 g/dL — ABNORMAL LOW (ref 6.5–8.1)

## 2023-12-07 MED ORDER — ZOLEDRONIC ACID 4 MG/100ML IV SOLN
4.0000 mg | Freq: Once | INTRAVENOUS | Status: AC
  Administered 2023-12-07: 4 mg via INTRAVENOUS
  Filled 2023-12-07: qty 100

## 2023-12-07 MED ORDER — IMMUNE GLOBULIN (HUMAN) 20 GM/200ML IV SOLN
40.0000 g | Freq: Once | INTRAVENOUS | Status: AC
Start: 2023-12-07 — End: 2023-12-07
  Administered 2023-12-07: 40 g via INTRAVENOUS
  Filled 2023-12-07: qty 400

## 2023-12-07 MED ORDER — SODIUM CHLORIDE 0.9 % IV SOLN
20.0000 mg | Freq: Once | INTRAVENOUS | Status: AC
  Administered 2023-12-07: 20 mg via INTRAVENOUS
  Filled 2023-12-07: qty 20

## 2023-12-07 MED ORDER — SODIUM CHLORIDE 0.9 % IV SOLN: INTRAVENOUS | Status: DC

## 2023-12-07 MED ORDER — SODIUM CHLORIDE 0.9 % IV SOLN: Freq: Once | INTRAVENOUS | Status: AC

## 2023-12-07 MED ORDER — DEXTROSE 5 % IV SOLN: INTRAVENOUS | Status: DC

## 2023-12-07 MED ORDER — DEXTROSE 5 % IV SOLN
36.0000 mg/m2 | Freq: Once | INTRAVENOUS | Status: AC
  Administered 2023-12-07: 80 mg via INTRAVENOUS
  Filled 2023-12-07: qty 30

## 2023-12-07 NOTE — Patient Instructions (Signed)
 Immune Globulin  Injection What is this medication? IMMUNE GLOBULIN  (im MUNE GLOB yoo lin) treats many immune system conditions. It works by designer, multimedia extra antibodies. Antibodies are proteins made by the immune system that help protect the body. This medicine may be used for other purposes; ask your health care provider or pharmacist if you have questions. COMMON BRAND NAME(S): ASCENIV, Baygam, BIVIGAM, Carimune, Carimune NF, cutaquig, Cuvitru, Flebogamma, Flebogamma DIF, GamaSTAN, GamaSTAN S/D, Gamimune N, Gammagard, Gammagard S/D, Gammaked, Gammaplex, Gammar-P IV, Gamunex, Gamunex-C, Hizentra, Iveegam, Iveegam EN, Octagam, Panglobulin, Panglobulin NF, panzyga, Polygam S/D, Privigen , Sandoglobulin, Venoglobulin-S, Vigam, Vivaglobulin, Xembify What should I tell my care team before I take this medication? They need to know if you have any of these conditions: Blood clotting disorder Condition where you have excess fluid in your body, such as heart failure or edema Dehydration Diabetes Have had blood clots Heart disease Immune system conditions Kidney disease Low levels of IgA Recent or upcoming vaccine An unusual or allergic reaction to immune globulin , other medications, foods, dyes, or preservatives Pregnant or trying to get pregnant Breastfeeding How should I use this medication? This medication is infused into a vein or under the skin. It is usually given by your care team in a hospital or clinic setting. It may also be given at home. If you get this medication at home, you will be taught how to prepare and give it. Use exactly as directed. Take it as directed on the prescription label at the same time every day. Keep taking it unless your care team tells you to stop. It is important that you put your used needles and syringes in a special sharps container. Do not put them in a trash can. If you do not have a sharps container, call your pharmacist or care team to get one. Talk to  your care team about the use of this medication in children. While it may be given to children for selected conditions, precautions do apply. Overdosage: If you think you have taken too much of this medicine contact a poison control center or emergency room at once. NOTE: This medicine is only for you. Do not share this medicine with others. What if I miss a dose? If you get this medication at the hospital or clinic: It is important not to miss your dose. Call your care team if you are unable to keep an appointment. If you give yourself this medication at home: If you miss a dose, take it as soon as you can. Then continue your normal schedule. If it is almost time for your next dose, take only that dose. Do not take double or extra doses. Call your care team with questions. What may interact with this medication? Live virus vaccines This list may not describe all possible interactions. Give your health care provider a list of all the medicines, herbs, non-prescription drugs, or dietary supplements you use. Also tell them if you smoke, drink alcohol, or use illegal drugs. Some items may interact with your medicine. What should I watch for while using this medication? Your condition will be monitored carefully while you are receiving this medication. Tell your care team if your symptoms do not start to get better or if they get worse. You may need blood work done while you are taking this medication. This medication increases the risk of blood clots. People with heart, blood vessel, or blood clotting conditions are more likely to develop a blood clot. Other risk factors include advanced age, estrogen  use, tobacco use, lack of movement, and being overweight. This medication can decrease the response to a vaccine. If you need to get vaccinated, tell your care team if you have received this medication within the last year. Extra booster doses may be needed. Talk to your care team to see if a different  vaccination schedule is needed. If you have diabetes, you may get a falsely elevated blood sugar reading. Talk to your care team about how to check your blood sugar while taking this medication. What side effects may I notice from receiving this medication? Side effects that you should report to your care team as soon as possible: Allergic reactions--skin rash, itching, hives, swelling of the face, lips, tongue, or throat Blood clot--pain, swelling, or warmth in the leg, shortness of breath, chest pain Fever, neck pain or stiffness, sensitivity to light, headache, nausea, vomiting, confusion, which may be signs of meningitis Hemolytic anemia--unusual weakness or fatigue, dizziness, headache, trouble breathing, dark urine, yellowing skin or eyes Kidney injury--decrease in the amount of urine, swelling of the ankles, hands, or feet Low sodium level--muscle weakness, fatigue, dizziness, headache, confusion Shortness of breath or trouble breathing, cough, unusual weakness or fatigue, blue skin or lips Side effects that usually do not require medical attention (report these to your care team if they continue or are bothersome): Chills Diarrhea Fever Headache Nausea This list may not describe all possible side effects. Call your doctor for medical advice about side effects. You may report side effects to FDA at 1-800-FDA-1088. Where should I keep my medication? Keep out of the reach of children and pets. You will be instructed on how to store this medication. Get rid of any unused medication after the expiration date. To get rid of medications that are no longer needed or have expired: Take the medication to a medication take-back program. Check with your pharmacy or law enforcement to find a location. If you cannot return the medication, ask your pharmacist or care team how to get rid of this medication safely. NOTE: This sheet is a summary. It may not cover all possible information. If you have  questions about this medicine, talk to your doctor, pharmacist, or health care provider.  2024 Elsevier/Gold Standard (2023-09-29 00:00:00) [facility] - A DEPT OF [facility]  Discharge Instructions: Thank you for choosing [facility] to provide your oncology and hematology care.   If you have a lab appointment with the Cancer Center, please go directly to the Cancer Center and check in at the registration area.  Wear comfortable clothing and clothing appropriate for easy access to any Portacath or PICC line.   We strive to give you quality time with your provider. You may need to reschedule your appointment if you arrive late (15 or more minutes).  Arriving late affects you and other patients whose appointments are after yours.  Also, if you miss three or more appointments without notifying the office, you may be dismissed from the clinic at the provider's discretion.      For prescription refill requests, have your pharmacy contact our office and allow 72 hours for refills to be completed.    Today you received the following chemotherapy and/or immunotherapy agents Kyprolis , Zometa       To help prevent nausea and vomiting after your treatment, we encourage you to take your nausea medication as directed.  BELOW ARE SYMPTOMS THAT SHOULD BE REPORTED IMMEDIATELY: *FEVER GREATER THAN 100.4 F (38 C)  OR HIGHER *CHILLS OR SWEATING *NAUSEA AND VOMITING THAT IS NOT CONTROLLED WITH YOUR NAUSEA MEDICATION *UNUSUAL SHORTNESS OF BREATH *UNUSUAL BRUISING OR BLEEDING *URINARY PROBLEMS (pain or burning when urinating, or frequent urination) *BOWEL PROBLEMS (unusual diarrhea, constipation, pain near the anus) TENDERNESS IN MOUTH AND THROAT WITH OR WITHOUT PRESENCE OF ULCERS (sore throat, sores in mouth, or a toothache) UNUSUAL RASH, SWELLING OR PAIN  UNUSUAL VAGINAL DISCHARGE OR ITCHING   Items with * indicate a potential emergency and should be followed  up as soon as possible or go to the Emergency Department if any problems should occur.  Please show the CHEMOTHERAPY ALERT CARD or IMMUNOTHERAPY ALERT CARD at check-in to the Emergency Department and triage nurse. Should you have questions after your visit or need to cancel or reschedule your appointment, please contact [facility] - A DEPT OF [facility]  [phone] and follow the prompts.  Office hours are 8:00 a.m. to 4:30 p.m. Monday - Friday. Please note that voicemails left after 4:00 p.m. may not be returned until the following business day.  We are closed weekends and major holidays. You have access to a nurse at all times for urgent questions. Please call the main number to the clinic [phone] and follow the prompts.  For any non-urgent questions, you may also contact your provider using MyChart. We now offer e-Visits for anyone 26 and older to request care online for non-urgent symptoms. For details visit [email or web address].   Also download the MyChart app! Go to the app store, search MyChart, open the app, select [facility], and log in with your MyChart username and password.

## 2023-12-14 ENCOUNTER — Inpatient Hospital Stay: Payer: Medicare Other

## 2023-12-14 ENCOUNTER — Ambulatory Visit: Payer: Medicare Other | Admitting: Hematology & Oncology

## 2023-12-14 VITALS — BP 134/49 | HR 50 | Temp 98.2°F | Resp 18

## 2023-12-14 DIAGNOSIS — C419 Malignant neoplasm of bone and articular cartilage, unspecified: Secondary | ICD-10-CM

## 2023-12-14 DIAGNOSIS — Z5111 Encounter for antineoplastic chemotherapy: Secondary | ICD-10-CM | POA: Diagnosis not present

## 2023-12-14 DIAGNOSIS — C9001 Multiple myeloma in remission: Secondary | ICD-10-CM

## 2023-12-14 LAB — CBC WITH DIFFERENTIAL (CANCER CENTER ONLY)
Abs Immature Granulocytes: 0.03 10*3/uL (ref 0.00–0.07)
Basophils Absolute: 0 10*3/uL (ref 0.0–0.1)
Basophils Relative: 0 %
Eosinophils Absolute: 0.3 10*3/uL (ref 0.0–0.5)
Eosinophils Relative: 5 %
HCT: 36.7 % — ABNORMAL LOW (ref 39.0–52.0)
Hemoglobin: 12.2 g/dL — ABNORMAL LOW (ref 13.0–17.0)
Immature Granulocytes: 1 %
Lymphocytes Relative: 11 %
Lymphs Abs: 0.6 10*3/uL — ABNORMAL LOW (ref 0.7–4.0)
MCH: 31.7 pg (ref 26.0–34.0)
MCHC: 33.2 g/dL (ref 30.0–36.0)
MCV: 95.3 fL (ref 80.0–100.0)
Monocytes Absolute: 0.9 10*3/uL (ref 0.1–1.0)
Monocytes Relative: 16 %
Neutro Abs: 3.8 10*3/uL (ref 1.7–7.7)
Neutrophils Relative %: 67 %
Platelet Count: 53 10*3/uL — ABNORMAL LOW (ref 150–400)
RBC: 3.85 MIL/uL — ABNORMAL LOW (ref 4.22–5.81)
RDW: 15.8 % — ABNORMAL HIGH (ref 11.5–15.5)
WBC Count: 5.6 10*3/uL (ref 4.0–10.5)
nRBC: 0 % (ref 0.0–0.2)

## 2023-12-14 LAB — CMP (CANCER CENTER ONLY)
ALT: 58 U/L — ABNORMAL HIGH (ref 0–44)
AST: 23 U/L (ref 15–41)
Albumin: 3.6 g/dL (ref 3.5–5.0)
Alkaline Phosphatase: 95 U/L (ref 38–126)
Anion gap: 6 (ref 5–15)
BUN: 24 mg/dL — ABNORMAL HIGH (ref 8–23)
CO2: 27 mmol/L (ref 22–32)
Calcium: 10.2 mg/dL (ref 8.9–10.3)
Chloride: 106 mmol/L (ref 98–111)
Creatinine: 1.61 mg/dL — ABNORMAL HIGH (ref 0.61–1.24)
GFR, Estimated: 45 mL/min — ABNORMAL LOW (ref >60)
Glucose, Bld: 123 mg/dL — ABNORMAL HIGH (ref 70–99)
Potassium: 4.3 mmol/L (ref 3.5–5.1)
Sodium: 139 mmol/L (ref 135–145)
Total Bilirubin: 0.3 mg/dL (ref 0.0–1.2)
Total Protein: 6.1 g/dL — ABNORMAL LOW (ref 6.5–8.1)

## 2023-12-14 MED ORDER — SODIUM CHLORIDE 0.9 % IV SOLN: Freq: Once | INTRAVENOUS | Status: AC

## 2023-12-14 NOTE — Progress Notes (Signed)
Hold treatment today due to platelets 53. Patient will continue to take the dexamethasone prescription as ordered for next week, and then follow up with [facility] 2/26 as scheduled Per Dr.Ennever  Patient creatinine 1.61 today. Dr. Myna Hidalgo notified. Order given and carried out for 1 liter Normal Saline over 2 hours. Patient verbalized understanding of the above mentioned plan.

## 2023-12-14 NOTE — Patient Instructions (Signed)

## 2023-12-22 ENCOUNTER — Other Ambulatory Visit: Payer: Self-pay | Admitting: Hematology & Oncology

## 2023-12-22 DIAGNOSIS — C9000 Multiple myeloma not having achieved remission: Secondary | ICD-10-CM

## 2023-12-27 ENCOUNTER — Telehealth: Payer: Self-pay | Admitting: *Deleted

## 2023-12-27 NOTE — Telephone Encounter (Signed)
 Received call from Lauren from the CAR-T team stating, "Bruce Smith will have his cells collected tomorrow. He needs to restart the Pomalyst and Kyprolis the next time he sees Dr. Myna Hidalgo." I told her that he is scheduled for Monday, March 3rd for labs/MD/infusion (Kyprolis).

## 2023-12-28 ENCOUNTER — Ambulatory Visit: Payer: Medicare Other | Admitting: Hematology & Oncology

## 2023-12-28 ENCOUNTER — Ambulatory Visit: Payer: Medicare Other

## 2023-12-28 ENCOUNTER — Other Ambulatory Visit: Payer: Medicare Other

## 2024-01-01 ENCOUNTER — Other Ambulatory Visit: Payer: Self-pay

## 2024-01-01 ENCOUNTER — Inpatient Hospital Stay: Payer: Medicare Other | Attending: Hematology & Oncology

## 2024-01-01 ENCOUNTER — Inpatient Hospital Stay: Payer: Medicare Other | Admitting: Hematology & Oncology

## 2024-01-01 ENCOUNTER — Encounter: Payer: Self-pay | Admitting: Hematology & Oncology

## 2024-01-01 ENCOUNTER — Inpatient Hospital Stay: Payer: Medicare Other

## 2024-01-01 VITALS — BP 139/51 | HR 56 | Temp 97.9°F | Resp 18 | Ht 68.0 in | Wt 209.0 lb

## 2024-01-01 DIAGNOSIS — C9000 Multiple myeloma not having achieved remission: Secondary | ICD-10-CM | POA: Insufficient documentation

## 2024-01-01 DIAGNOSIS — Z5111 Encounter for antineoplastic chemotherapy: Secondary | ICD-10-CM | POA: Insufficient documentation

## 2024-01-01 DIAGNOSIS — C9001 Multiple myeloma in remission: Secondary | ICD-10-CM | POA: Diagnosis not present

## 2024-01-01 DIAGNOSIS — Z9484 Stem cells transplant status: Secondary | ICD-10-CM | POA: Insufficient documentation

## 2024-01-01 DIAGNOSIS — Z7961 Long term (current) use of immunomodulator: Secondary | ICD-10-CM | POA: Diagnosis not present

## 2024-01-01 LAB — CMP (CANCER CENTER ONLY)
ALT: 39 U/L (ref 0–44)
AST: 19 U/L (ref 15–41)
Albumin: 3.7 g/dL (ref 3.5–5.0)
Alkaline Phosphatase: 101 U/L (ref 38–126)
Anion gap: 6 (ref 5–15)
BUN: 20 mg/dL (ref 8–23)
CO2: 26 mmol/L (ref 22–32)
Calcium: 9.3 mg/dL (ref 8.9–10.3)
Chloride: 109 mmol/L (ref 98–111)
Creatinine: 1.4 mg/dL — ABNORMAL HIGH (ref 0.61–1.24)
GFR, Estimated: 53 mL/min — ABNORMAL LOW (ref >60)
Glucose, Bld: 92 mg/dL (ref 70–99)
Potassium: 4.1 mmol/L (ref 3.5–5.1)
Sodium: 141 mmol/L (ref 135–145)
Total Bilirubin: 0.3 mg/dL (ref 0.0–1.2)
Total Protein: 5.7 g/dL — ABNORMAL LOW (ref 6.5–8.1)

## 2024-01-01 LAB — CBC WITH DIFFERENTIAL (CANCER CENTER ONLY)
Abs Immature Granulocytes: 0.01 10*3/uL (ref 0.00–0.07)
Basophils Absolute: 0.1 10*3/uL (ref 0.0–0.1)
Basophils Relative: 2 %
Eosinophils Absolute: 0.2 10*3/uL (ref 0.0–0.5)
Eosinophils Relative: 5 %
HCT: 34.3 % — ABNORMAL LOW (ref 39.0–52.0)
Hemoglobin: 11.4 g/dL — ABNORMAL LOW (ref 13.0–17.0)
Immature Granulocytes: 0 %
Lymphocytes Relative: 17 %
Lymphs Abs: 0.7 10*3/uL (ref 0.7–4.0)
MCH: 31.7 pg (ref 26.0–34.0)
MCHC: 33.2 g/dL (ref 30.0–36.0)
MCV: 95.3 fL (ref 80.0–100.0)
Monocytes Absolute: 0.8 10*3/uL (ref 0.1–1.0)
Monocytes Relative: 20 %
Neutro Abs: 2.2 10*3/uL (ref 1.7–7.7)
Neutrophils Relative %: 56 %
Platelet Count: 112 10*3/uL — ABNORMAL LOW (ref 150–400)
RBC: 3.6 MIL/uL — ABNORMAL LOW (ref 4.22–5.81)
RDW: 15.7 % — ABNORMAL HIGH (ref 11.5–15.5)
WBC Count: 4 10*3/uL (ref 4.0–10.5)
nRBC: 0 % (ref 0.0–0.2)

## 2024-01-01 LAB — LACTATE DEHYDROGENASE: LDH: 144 U/L (ref 98–192)

## 2024-01-01 MED ORDER — DEXTROSE 5 % IV SOLN
36.0000 mg/m2 | Freq: Once | INTRAVENOUS | Status: AC
  Administered 2024-01-01: 80 mg via INTRAVENOUS
  Filled 2024-01-01: qty 30

## 2024-01-01 MED ORDER — SODIUM CHLORIDE 0.9 % IV SOLN
20.0000 mg | Freq: Once | INTRAVENOUS | Status: AC
  Administered 2024-01-01: 20 mg via INTRAVENOUS
  Filled 2024-01-01: qty 20

## 2024-01-01 MED ORDER — SODIUM CHLORIDE 0.9 % IV SOLN: INTRAVENOUS | Status: DC

## 2024-01-01 NOTE — Progress Notes (Signed)
 [facility] Hematology and Oncology Follow Up Visit  Bruce Smith [id number] Jun 07, 1951 73 y.o. 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 - t(11:14), 13q-, 1p-,1q-  Past Therapy: Status post surgical repair of left femur-07/14/2020 XRT to left shoulder and left hip Xgeva 120 mg subcu every 3 months-next dose in December 2022  Faspro/Velcade/Revlimid/Decadron -- s/p cycle #2 -- start on       08/06/2020  ( Revlimid is 14 on /14 off)    Current Therapy:        ASCT -- [facility] on -01/22/2021 Faspro - q month -- maintenance -- start on 05/13/2021 -- d/c on 10/10/2023 Zometa 4 mg IV every 3 months-next dose in 02/2024  Radiation therapy to T12, left iliac and left humerus --completed on 10/04/2022 --XRT to right femoral head -start 01/03/2023 XRT to T9 lesion -to be completed on 06/18/2023 Kyprolis/Pomalyst -- s/p cycle #2 - start on 10/19/2023 IVIG 40 g monthly    Interim History:  Bruce Smith is here today for follow-up.  He is doing quite well.  He had his lymphocytes collected for the CAR-T protocol.  This will be given to him in April.  He has had no problems so far.  He is doing well with the Kyprolis/pomalidomide.  He has had no problems with the pomalidomide.  He has had no problems with nausea or vomiting.  He has had no rashes.  There has been no leg swelling.  He has had no cough or shortness of breath.  He has had no bleeding.  He does get IVIG infusions.  His last IgG level in January was 793 mg/dL.  His last Kappa light chain was 1.3 mg/dL.  Overall, I would have to say that his performance status is ECOG 1.   Medications:  Allergies as of 01/01/2024   No Known Allergies      Medication List        Accurate as of January 01, 2024  9:44 AM. If you have any questions, ask your nurse or doctor.          acetaminophen 500 MG tablet Commonly known as: TYLENOL Take  1,000 mg by mouth every 4 (four) hours.   ALLERGY PO Take 10 mg by mouth daily.   aspirin EC 81 MG tablet Take 81 mg by mouth at bedtime.   benzonatate 200 MG capsule Commonly known as: TESSALON Take 1 capsule (200 mg total) by mouth 3 (three) times daily as needed for cough.   CALCIUM 600 PO Take 600 mg by mouth daily.   dexamethasone 4 MG tablet Commonly known as: DECADRON Take 5 tablets (20 mg) for 1 day on the week after chemotherapy (day 22).   famciclovir 250 MG tablet Commonly known as: FAMVIR Take 1 tablet (250 mg total) by mouth daily.   guaiFENesin 600 MG 12 hr tablet Commonly known as: MUCINEX Take 1 tablet (600 mg total) by mouth 2 (two) times daily as needed for to loosen phlegm.   Ipratropium-Albuterol 20-100 MCG/ACT Aers respimat Commonly known as: COMBIVENT Inhale 1 puff into the lungs every 6 (six) hours as needed for wheezing.   levETIRAcetam 500 MG tablet Commonly known as: KEPPRA TAKE 1 TABLET BY MOUTH 2 TIMES A DAY   Magnesium 250 MG Caps Take 250 mg by mouth daily.   metoprolol succinate 50 MG 24 hr tablet Commonly known as: TOPROL-XL Take 50 mg by mouth daily.  naloxone 4 MG/0.1ML Liqd nasal spray kit Commonly known as: Engineer, manufacturing systems as directed in each nostril in the event of an overdose. May repeat in 15 minutes.   nitroGLYCERIN 0.4 MG SL tablet Commonly known as: NITROSTAT Place 0.4 mg under the tongue every 5 (five) minutes as needed.   ondansetron 8 MG tablet Commonly known as: Zofran Take 1 tablet (8 mg total) by mouth every 8 (eight) hours as needed for nausea or vomiting.   pantoprazole 40 MG tablet Commonly known as: PROTONIX TAKE 1 TABLET BY MOUTH 2 TIMES A DAY BEFORE MEALS FOR ACID REFLUX   polyethylene glycol powder 17 GM/SCOOP powder Commonly known as: GLYCOLAX/MIRALAX Take 17 g by mouth daily.   pomalidomide 3 MG capsule Commonly known as: Pomalyst Take 1 capsule (3 mg total) by mouth daily. Take 21 days on, 7  days off, repeat every 28 days. [employer] Auth # [id number] Date Obtained 12/01/23   pregabalin 200 MG capsule Commonly known as: LYRICA TAKE 1 CAPSULE BY MOUTH 2 TIMES DAILY   prochlorperazine 10 MG tablet Commonly known as: COMPAZINE Take 1 tablet (10 mg total) by mouth every 6 (six) hours as needed for nausea or vomiting.   rosuvastatin 40 MG tablet Commonly known as: CRESTOR TAKE 1 TABLET BY MOUTH EVERY DAY FOR CHOLESTEROL   saccharomyces boulardii 250 MG capsule Commonly known as: FLORASTOR Take 250 mg by mouth daily.   tamsulosin 0.4 MG Caps capsule Commonly known as: FLOMAX TAKE 1 CAPSULE BY MOUTH EVERY NIGHT AT BEDTIME What changed: when to take this   traMADol 50 MG tablet Commonly known as: ULTRAM TAKE 2 TABLETS BY MOUTH EVERY 6 HOURS ASNEEDED FOR PAIN        Allergies: No Known Allergies  Past Medical History, Surgical history, Social history, and Family History were reviewed and updated.  Review of Systems: Review of Systems  Constitutional: Negative.   HENT: Negative.    Eyes: Negative.   Respiratory: Negative.    Cardiovascular: Negative.   Gastrointestinal: Negative.   Genitourinary: Negative.   Musculoskeletal:  Positive for joint pain.  Skin: Negative.   Neurological: Negative.   Endo/Heme/Allergies: Negative.   Psychiatric/Behavioral: Negative.       Physical Exam:  height is 5\' 8"  (1.727 m) and weight is 209 lb (94.8 kg). His oral temperature is 97.9 F (36.6 C). His blood pressure is 139/51 (abnormal) and his pulse is 56 (abnormal). His respiration is 18 and oxygen saturation is 98%.   Wt Readings from Last 3 Encounters:  01/01/24 209 lb (94.8 kg)  11/30/23 201 lb (91.2 kg)  11/16/23 210 lb 5.1 oz (95.4 kg)    Physical Exam Vitals reviewed.  Constitutional:      Comments: This is a well-developed and well-nourished white male in no obvious distress.  He has a nicely healed median sternotomy scar.  HENT:     Head: Normocephalic and  atraumatic.  Eyes:     Pupils: Pupils are equal, round, and reactive to light.  Cardiovascular:     Rate and Rhythm: Normal rate and regular rhythm.     Heart sounds: Normal heart sounds.  Pulmonary:     Effort: Pulmonary effort is normal.     Breath sounds: Normal breath sounds.  Abdominal:     General: Bowel sounds are normal.     Palpations: Abdomen is soft.  Musculoskeletal:        General: No tenderness or deformity. Normal range of motion.     Cervical back:  Normal range of motion.     Comments: There is pain to palpation in the left hip area.  This is in the lateral hip area.  He has some decreased range of motion.  Lymphadenopathy:     Cervical: No cervical adenopathy.  Skin:    General: Skin is warm and dry.     Findings: No erythema or rash.  Neurological:     Mental Status: He is alert and oriented to person, place, and time.  Psychiatric:        Behavior: Behavior normal.        Thought Content: Thought content normal.        Judgment: Judgment normal.      Lab Results  Component Value Date   WBC 4.0 01/01/2024   HGB 11.4 (L) 01/01/2024   HCT 34.3 (L) 01/01/2024   MCV 95.3 01/01/2024   PLT 112 (L) 01/01/2024   Lab Results  Component Value Date   FERRITIN 135 11/15/2023   IRON 35 (L) 11/15/2023   TIBC 225 (L) 11/15/2023   UIBC 190 11/15/2023   IRONPCTSAT 16 (L) 11/15/2023   Lab Results  Component Value Date   RETICCTPCT 1.3 11/14/2023   RBC 3.60 (L) 01/01/2024   Lab Results  Component Value Date   KPAFRELGTCHN 12.6 11/30/2023   LAMBDASER 3.7 (L) 11/30/2023   KAPLAMBRATIO 3.41 (H) 11/30/2023   Lab Results  Component Value Date   IGGSERUM 793 11/30/2023   IGA 21 (L) 11/30/2023   IGMSERUM 16 11/30/2023   Lab Results  Component Value Date   TOTALPROTELP 5.6 (L) 11/30/2023   ALBUMINELP 3.1 11/30/2023   A1GS 0.3 11/30/2023   A2GS 0.8 11/30/2023   BETS 0.7 11/30/2023   GAMS 0.7 11/30/2023   MSPIKE Not Observed 11/30/2023     Chemistry       Component Value Date/Time   NA 141 01/01/2024 0826   K 4.1 01/01/2024 0826   CL 109 01/01/2024 0826   CO2 26 01/01/2024 0826   BUN 20 01/01/2024 0826   CREATININE 1.40 (H) 01/01/2024 0826      Component Value Date/Time   CALCIUM 9.3 01/01/2024 0826   ALKPHOS 101 01/01/2024 0826   AST 19 01/01/2024 0826   ALT 39 01/01/2024 0826   BILITOT 0.3 01/01/2024 0826       Impression and Plan: Mr. Debois is a very pleasant 72 yo gentleman with 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   We will continue him on the pomalidomide/Kyprolis.  This will be his third cycle.  Again, it sounds like he is going to have the infusion of the CAR-T cells in early April.  This will be his last cycle of treatment.  I will plan to see him back probably sometime in April after he has his CAR-T infusion.    Josph Macho, MD 3/3/20259:44 AM

## 2024-01-02 ENCOUNTER — Other Ambulatory Visit: Payer: Self-pay

## 2024-01-02 ENCOUNTER — Telehealth: Payer: Self-pay | Admitting: *Deleted

## 2024-01-02 ENCOUNTER — Telehealth: Payer: Self-pay | Admitting: Hematology & Oncology

## 2024-01-02 LAB — IGG, IGA, IGM
IgA: 26 mg/dL — ABNORMAL LOW (ref 61–437)
IgG (Immunoglobin G), Serum: 661 mg/dL (ref 603–1613)
IgM (Immunoglobulin M), Srm: 20 mg/dL (ref 15–143)

## 2024-01-02 LAB — KAPPA/LAMBDA LIGHT CHAINS
Kappa free light chain: 17.8 mg/L (ref 3.3–19.4)
Kappa, lambda light chain ratio: 2.83 — ABNORMAL HIGH (ref 0.26–1.65)
Lambda free light chains: 6.3 mg/L (ref 5.7–26.3)

## 2024-01-02 NOTE — Telephone Encounter (Signed)
 Patient needs D8 & D15 tx and a 4 week f/u per 01/02/24 los

## 2024-01-02 NOTE — Addendum Note (Signed)
 Addended by: Josph Macho on: 01/02/2024 09:54 AM   Modules accepted: Orders

## 2024-01-02 NOTE — Telephone Encounter (Signed)
 This nurse called and left Lauren a message informing her that this patient has been scheduled for a PET scan and a Bone Marrow Biopsy the first week of April. Her direct line is [phone].

## 2024-01-03 ENCOUNTER — Other Ambulatory Visit: Payer: Self-pay

## 2024-01-04 ENCOUNTER — Other Ambulatory Visit: Payer: Self-pay | Admitting: Hematology & Oncology

## 2024-01-07 ENCOUNTER — Other Ambulatory Visit: Payer: Self-pay

## 2024-01-08 ENCOUNTER — Inpatient Hospital Stay

## 2024-01-08 VITALS — BP 141/58 | HR 51 | Temp 97.5°F | Resp 19

## 2024-01-08 DIAGNOSIS — Z5111 Encounter for antineoplastic chemotherapy: Secondary | ICD-10-CM | POA: Diagnosis not present

## 2024-01-08 DIAGNOSIS — C9001 Multiple myeloma in remission: Secondary | ICD-10-CM

## 2024-01-08 LAB — CBC WITH DIFFERENTIAL (CANCER CENTER ONLY)
Abs Immature Granulocytes: 0.03 10*3/uL (ref 0.00–0.07)
Basophils Absolute: 0 10*3/uL (ref 0.0–0.1)
Basophils Relative: 0 %
Eosinophils Absolute: 0.3 10*3/uL (ref 0.0–0.5)
Eosinophils Relative: 8 %
HCT: 35.7 % — ABNORMAL LOW (ref 39.0–52.0)
Hemoglobin: 12 g/dL — ABNORMAL LOW (ref 13.0–17.0)
Immature Granulocytes: 1 %
Lymphocytes Relative: 13 %
Lymphs Abs: 0.5 10*3/uL — ABNORMAL LOW (ref 0.7–4.0)
MCH: 32.3 pg (ref 26.0–34.0)
MCHC: 33.6 g/dL (ref 30.0–36.0)
MCV: 96.2 fL (ref 80.0–100.0)
Monocytes Absolute: 0.5 10*3/uL (ref 0.1–1.0)
Monocytes Relative: 13 %
Neutro Abs: 2.4 10*3/uL (ref 1.7–7.7)
Neutrophils Relative %: 65 %
Platelet Count: 95 10*3/uL — ABNORMAL LOW (ref 150–400)
RBC: 3.71 MIL/uL — ABNORMAL LOW (ref 4.22–5.81)
RDW: 16.2 % — ABNORMAL HIGH (ref 11.5–15.5)
WBC Count: 3.7 10*3/uL — ABNORMAL LOW (ref 4.0–10.5)
nRBC: 0 % (ref 0.0–0.2)

## 2024-01-08 LAB — CMP (CANCER CENTER ONLY)
ALT: 36 U/L (ref 0–44)
AST: 14 U/L — ABNORMAL LOW (ref 15–41)
Albumin: 3.7 g/dL (ref 3.5–5.0)
Alkaline Phosphatase: 96 U/L (ref 38–126)
Anion gap: 9 (ref 5–15)
BUN: 20 mg/dL (ref 8–23)
CO2: 24 mmol/L (ref 22–32)
Calcium: 9.4 mg/dL (ref 8.9–10.3)
Chloride: 111 mmol/L (ref 98–111)
Creatinine: 1.24 mg/dL (ref 0.61–1.24)
GFR, Estimated: >60 mL/min (ref >60)
Glucose, Bld: 118 mg/dL — ABNORMAL HIGH (ref 70–99)
Potassium: 3.7 mmol/L (ref 3.5–5.1)
Sodium: 144 mmol/L (ref 135–145)
Total Bilirubin: 0.4 mg/dL (ref 0.0–1.2)
Total Protein: 5.8 g/dL — ABNORMAL LOW (ref 6.5–8.1)

## 2024-01-08 MED ORDER — SODIUM CHLORIDE 0.9 % IV SOLN
20.0000 mg | Freq: Once | INTRAVENOUS | Status: AC
  Administered 2024-01-08: 20 mg via INTRAVENOUS
  Filled 2024-01-08: qty 20

## 2024-01-08 MED ORDER — SODIUM CHLORIDE 0.9 % IV SOLN: INTRAVENOUS | Status: DC

## 2024-01-08 MED ORDER — DEXTROSE 5 % IV SOLN
36.0000 mg/m2 | Freq: Once | INTRAVENOUS | Status: AC
  Administered 2024-01-08: 80 mg via INTRAVENOUS
  Filled 2024-01-08: qty 30

## 2024-01-08 NOTE — Patient Instructions (Signed)
 [facility] A DEPT OF [facility]  Discharge Instructions: Thank you for choosing [facility] to provide your oncology and hematology care.   If you have a lab appointment with the Cancer Center, please go directly to the Cancer Center and check in at the registration area.  Wear comfortable clothing and clothing appropriate for easy access to any Portacath or PICC line.   We strive to give you quality time with your provider. You may need to reschedule your appointment if you arrive late (15 or more minutes).  Arriving late affects you and other patients whose appointments are after yours.  Also, if you miss three or more appointments without notifying the office, you may be dismissed from the clinic at the provider's discretion.      For prescription refill requests, have your pharmacy contact our office and allow 72 hours for refills to be completed.    Today you received the following chemotherapy and/or immunotherapy agents:  Kyprolis      To help prevent nausea and vomiting after your treatment, we encourage you to take your nausea medication as directed.  BELOW ARE SYMPTOMS THAT SHOULD BE REPORTED IMMEDIATELY: *FEVER GREATER THAN 100.4 F (38 C) OR HIGHER *CHILLS OR SWEATING *NAUSEA AND VOMITING THAT IS NOT CONTROLLED WITH YOUR NAUSEA MEDICATION *UNUSUAL SHORTNESS OF BREATH *UNUSUAL BRUISING OR BLEEDING *URINARY PROBLEMS (pain or burning when urinating, or frequent urination) *BOWEL PROBLEMS (unusual diarrhea, constipation, pain near the anus) TENDERNESS IN MOUTH AND THROAT WITH OR WITHOUT PRESENCE OF ULCERS (sore throat, sores in mouth, or a toothache) UNUSUAL RASH, SWELLING OR PAIN  UNUSUAL VAGINAL DISCHARGE OR ITCHING   Items with * indicate a potential emergency and should be followed up as soon as possible or go to the Emergency Department if any problems should occur.  Please show the CHEMOTHERAPY ALERT CARD or IMMUNOTHERAPY  ALERT CARD at check-in to the Emergency Department and triage nurse. Should you have questions after your visit or need to cancel or reschedule your appointment, please contact [facility] - A DEPT OF [facility]  [phone] and follow the prompts.  Office hours are 8:00 a.m. to 4:30 p.m. Monday - Friday. Please note that voicemails left after 4:00 p.m. may not be returned until the following business day.  We are closed weekends and major holidays. You have access to a nurse at all times for urgent questions. Please call the main number to the clinic [phone] and follow the prompts.  For any non-urgent questions, you may also contact your provider using MyChart. We now offer e-Visits for anyone 25 and older to request care online for non-urgent symptoms. For details visit [email or web address].   Also download the MyChart app! Go to the app store, search "MyChart", open the app, select [facility], and log in with your MyChart username and password.

## 2024-01-08 NOTE — Progress Notes (Signed)
OK to treat with platelets of 95 per order of Dr. [name].

## 2024-01-15 ENCOUNTER — Inpatient Hospital Stay

## 2024-01-15 ENCOUNTER — Other Ambulatory Visit: Payer: Self-pay | Admitting: *Deleted

## 2024-01-15 VITALS — BP 124/47 | HR 50 | Temp 97.5°F | Resp 18

## 2024-01-15 DIAGNOSIS — C9001 Multiple myeloma in remission: Secondary | ICD-10-CM

## 2024-01-15 DIAGNOSIS — Z5111 Encounter for antineoplastic chemotherapy: Secondary | ICD-10-CM | POA: Diagnosis not present

## 2024-01-15 LAB — CBC WITH DIFFERENTIAL (CANCER CENTER ONLY)
Abs Immature Granulocytes: 0.04 10*3/uL (ref 0.00–0.07)
Basophils Absolute: 0 10*3/uL (ref 0.0–0.1)
Basophils Relative: 1 %
Eosinophils Absolute: 0.6 10*3/uL — ABNORMAL HIGH (ref 0.0–0.5)
Eosinophils Relative: 12 %
HCT: 36.5 % — ABNORMAL LOW (ref 39.0–52.0)
Hemoglobin: 12.1 g/dL — ABNORMAL LOW (ref 13.0–17.0)
Immature Granulocytes: 1 %
Lymphocytes Relative: 12 %
Lymphs Abs: 0.7 10*3/uL (ref 0.7–4.0)
MCH: 32.7 pg (ref 26.0–34.0)
MCHC: 33.2 g/dL (ref 30.0–36.0)
MCV: 98.6 fL (ref 80.0–100.0)
Monocytes Absolute: 1.4 10*3/uL — ABNORMAL HIGH (ref 0.1–1.0)
Monocytes Relative: 26 %
Neutro Abs: 2.6 10*3/uL (ref 1.7–7.7)
Neutrophils Relative %: 48 %
Platelet Count: 72 10*3/uL — ABNORMAL LOW (ref 150–400)
RBC: 3.7 MIL/uL — ABNORMAL LOW (ref 4.22–5.81)
RDW: 15.8 % — ABNORMAL HIGH (ref 11.5–15.5)
WBC Count: 5.4 10*3/uL (ref 4.0–10.5)
nRBC: 0 % (ref 0.0–0.2)

## 2024-01-15 LAB — CMP (CANCER CENTER ONLY)
ALT: 51 U/L — ABNORMAL HIGH (ref 0–44)
AST: 16 U/L (ref 15–41)
Albumin: 3.8 g/dL (ref 3.5–5.0)
Alkaline Phosphatase: 91 U/L (ref 38–126)
Anion gap: 8 (ref 5–15)
BUN: 22 mg/dL (ref 8–23)
CO2: 25 mmol/L (ref 22–32)
Calcium: 9 mg/dL (ref 8.9–10.3)
Chloride: 106 mmol/L (ref 98–111)
Creatinine: 1.3 mg/dL — ABNORMAL HIGH (ref 0.61–1.24)
GFR, Estimated: 58 mL/min — ABNORMAL LOW (ref >60)
Glucose, Bld: 91 mg/dL (ref 70–99)
Potassium: 4.5 mmol/L (ref 3.5–5.1)
Sodium: 139 mmol/L (ref 135–145)
Total Bilirubin: 0.4 mg/dL (ref 0.0–1.2)
Total Protein: 5.6 g/dL — ABNORMAL LOW (ref 6.5–8.1)

## 2024-01-15 MED ORDER — SODIUM CHLORIDE 0.9 % IV SOLN: INTRAVENOUS | Status: DC

## 2024-01-15 MED ORDER — SODIUM CHLORIDE 0.9 % IV SOLN
20.0000 mg | Freq: Once | INTRAVENOUS | Status: AC
  Administered 2024-01-15: 20 mg via INTRAVENOUS
  Filled 2024-01-15: qty 20

## 2024-01-15 MED ORDER — DULOXETINE HCL 60 MG PO CPEP: 60.0000 mg | ORAL_CAPSULE | Freq: Every day | ORAL | 3 refills | Status: DC

## 2024-01-15 MED ORDER — SODIUM CHLORIDE 0.9 % IV SOLN: Freq: Once | INTRAVENOUS | Status: AC

## 2024-01-15 MED ORDER — DEXTROSE 5 % IV SOLN
36.0000 mg/m2 | Freq: Once | INTRAVENOUS | Status: AC
  Administered 2024-01-15: 80 mg via INTRAVENOUS
  Filled 2024-01-15: qty 30

## 2024-01-15 NOTE — Patient Instructions (Signed)
 [facility] - A DEPT OF [facility]  Discharge Instructions: Thank you for choosing [facility] to provide your oncology and hematology care.   If you have a lab appointment with the Cancer Center, please go directly to the Cancer Center and check in at the registration area.  Wear comfortable clothing and clothing appropriate for easy access to any Portacath or PICC line.   We strive to give you quality time with your provider. You may need to reschedule your appointment if you arrive late (15 or more minutes).  Arriving late affects you and other patients whose appointments are after yours.  Also, if you miss three or more appointments without notifying the office, you may be dismissed from the clinic at the provider's discretion.      For prescription refill requests, have your pharmacy contact our office and allow 72 hours for refills to be completed.    Today you received the following chemotherapy and/or immunotherapy agents Carfilzomib      To help prevent nausea and vomiting after your treatment, we encourage you to take your nausea medication as directed.  BELOW ARE SYMPTOMS THAT SHOULD BE REPORTED IMMEDIATELY: *FEVER GREATER THAN 100.4 F (38 C) OR HIGHER *CHILLS OR SWEATING *NAUSEA AND VOMITING THAT IS NOT CONTROLLED WITH YOUR NAUSEA MEDICATION *UNUSUAL SHORTNESS OF BREATH *UNUSUAL BRUISING OR BLEEDING *URINARY PROBLEMS (pain or burning when urinating, or frequent urination) *BOWEL PROBLEMS (unusual diarrhea, constipation, pain near the anus) TENDERNESS IN MOUTH AND THROAT WITH OR WITHOUT PRESENCE OF ULCERS (sore throat, sores in mouth, or a toothache) UNUSUAL RASH, SWELLING OR PAIN  UNUSUAL VAGINAL DISCHARGE OR ITCHING   Items with * indicate a potential emergency and should be followed up as soon as possible or go to the Emergency Department if any problems should occur.  Please show the CHEMOTHERAPY ALERT CARD or IMMUNOTHERAPY  ALERT CARD at check-in to the Emergency Department and triage nurse. Should you have questions after your visit or need to cancel or reschedule your appointment, please contact [facility] - A DEPT OF Eligh[facility]ideg[facility]  [phone] and follow the prompts.  Office hours are 8:00 a.m. to 4:30 p.m. Monday - Friday. Please note that voicemails left after 4:00 p.m. may not be returned until the following business day.  We are closed weekends and major holidays. You have access to a nurse at all times for urgent questions. Please call the main number to the clinic [phone] and follow the prompts.  For any non-urgent questions, you may also contact your provider using MyChart. We now offer e-Visits for anyone 32 and older to request care online for non-urgent symptoms. For details visit [email or web address].   Also download the MyChart app! Go to the app store, search "MyChart", open the app, select , and log in with your MyChart username and password.

## 2024-01-15 NOTE — Progress Notes (Signed)
 Ok to treat with Plt 72 today per Dr. Bea Laura. Pt. and wife wish to restart Cymbalta for feet neuropathy symptoms. This was ordered a few years back by Dr. Jacqulyn Bath from [email or web address] per Dr. Bea Laura. Pt. and wife aware.

## 2024-01-18 ENCOUNTER — Other Ambulatory Visit: Payer: Self-pay | Admitting: Hematology & Oncology

## 2024-01-18 DIAGNOSIS — C9000 Multiple myeloma not having achieved remission: Secondary | ICD-10-CM

## 2024-01-19 ENCOUNTER — Encounter: Payer: Self-pay | Admitting: Hematology & Oncology

## 2024-01-23 ENCOUNTER — Other Ambulatory Visit: Payer: Self-pay

## 2024-01-23 DIAGNOSIS — C9000 Multiple myeloma not having achieved remission: Secondary | ICD-10-CM

## 2024-01-23 MED ORDER — POMALIDOMIDE 3 MG PO CAPS
3.0000 mg | ORAL_CAPSULE | Freq: Every day | ORAL | 0 refills | Status: DC
Start: 2024-01-23 — End: 2024-02-21

## 2024-01-31 ENCOUNTER — Inpatient Hospital Stay: Attending: Hematology & Oncology

## 2024-01-31 ENCOUNTER — Inpatient Hospital Stay (HOSPITAL_BASED_OUTPATIENT_CLINIC_OR_DEPARTMENT_OTHER): Admitting: Hematology & Oncology

## 2024-01-31 ENCOUNTER — Inpatient Hospital Stay

## 2024-01-31 ENCOUNTER — Encounter: Payer: Self-pay | Admitting: Hematology & Oncology

## 2024-01-31 VITALS — BP 134/55 | HR 58 | Temp 98.1°F | Resp 20 | Ht 68.0 in | Wt 205.0 lb

## 2024-01-31 DIAGNOSIS — C9000 Multiple myeloma not having achieved remission: Secondary | ICD-10-CM

## 2024-01-31 DIAGNOSIS — Z923 Personal history of irradiation: Secondary | ICD-10-CM | POA: Insufficient documentation

## 2024-01-31 DIAGNOSIS — Z9484 Stem cells transplant status: Secondary | ICD-10-CM | POA: Insufficient documentation

## 2024-01-31 DIAGNOSIS — C419 Malignant neoplasm of bone and articular cartilage, unspecified: Secondary | ICD-10-CM

## 2024-01-31 DIAGNOSIS — R918 Other nonspecific abnormal finding of lung field: Secondary | ICD-10-CM | POA: Diagnosis not present

## 2024-01-31 DIAGNOSIS — K668 Other specified disorders of peritoneum: Secondary | ICD-10-CM | POA: Diagnosis not present

## 2024-01-31 DIAGNOSIS — D801 Nonfamilial hypogammaglobulinemia: Secondary | ICD-10-CM | POA: Diagnosis not present

## 2024-01-31 DIAGNOSIS — Z87891 Personal history of nicotine dependence: Secondary | ICD-10-CM | POA: Diagnosis not present

## 2024-01-31 DIAGNOSIS — C9001 Multiple myeloma in remission: Secondary | ICD-10-CM

## 2024-01-31 LAB — LACTATE DEHYDROGENASE: LDH: 149 U/L (ref 98–192)

## 2024-01-31 LAB — CBC WITH DIFFERENTIAL (CANCER CENTER ONLY)
Abs Immature Granulocytes: 0.02 10*3/uL (ref 0.00–0.07)
Basophils Absolute: 0.1 10*3/uL (ref 0.0–0.1)
Basophils Relative: 2 %
Eosinophils Absolute: 0.2 10*3/uL (ref 0.0–0.5)
Eosinophils Relative: 5 %
HCT: 36.4 % — ABNORMAL LOW (ref 39.0–52.0)
Hemoglobin: 12.1 g/dL — ABNORMAL LOW (ref 13.0–17.0)
Immature Granulocytes: 1 %
Lymphocytes Relative: 16 %
Lymphs Abs: 0.6 10*3/uL — ABNORMAL LOW (ref 0.7–4.0)
MCH: 32.9 pg (ref 26.0–34.0)
MCHC: 33.2 g/dL (ref 30.0–36.0)
MCV: 98.9 fL (ref 80.0–100.0)
Monocytes Absolute: 1 10*3/uL (ref 0.1–1.0)
Monocytes Relative: 26 %
Neutro Abs: 2 10*3/uL (ref 1.7–7.7)
Neutrophils Relative %: 50 %
Platelet Count: 132 10*3/uL — ABNORMAL LOW (ref 150–400)
RBC: 3.68 MIL/uL — ABNORMAL LOW (ref 4.22–5.81)
RDW: 15.7 % — ABNORMAL HIGH (ref 11.5–15.5)
WBC Count: 3.8 10*3/uL — ABNORMAL LOW (ref 4.0–10.5)
nRBC: 0 % (ref 0.0–0.2)

## 2024-01-31 LAB — CMP (CANCER CENTER ONLY)
ALT: 64 U/L — ABNORMAL HIGH (ref 0–44)
AST: 23 U/L (ref 15–41)
Albumin: 3.8 g/dL (ref 3.5–5.0)
Alkaline Phosphatase: 91 U/L (ref 38–126)
Anion gap: 6 (ref 5–15)
BUN: 20 mg/dL (ref 8–23)
CO2: 27 mmol/L (ref 22–32)
Calcium: 9 mg/dL (ref 8.9–10.3)
Chloride: 108 mmol/L (ref 98–111)
Creatinine: 1.18 mg/dL (ref 0.61–1.24)
GFR, Estimated: >60 mL/min (ref >60)
Glucose, Bld: 86 mg/dL (ref 70–99)
Potassium: 4.1 mmol/L (ref 3.5–5.1)
Sodium: 141 mmol/L (ref 135–145)
Total Bilirubin: 0.4 mg/dL (ref 0.0–1.2)
Total Protein: 5.6 g/dL — ABNORMAL LOW (ref 6.5–8.1)

## 2024-01-31 MED ORDER — DEXTROSE 5 % IV SOLN: INTRAVENOUS | Status: DC

## 2024-01-31 MED ORDER — IMMUNE GLOBULIN (HUMAN) 20 GM/200ML IV SOLN
40.0000 g | Freq: Once | INTRAVENOUS | Status: AC
  Administered 2024-01-31: 40 g via INTRAVENOUS
  Filled 2024-01-31: qty 400

## 2024-01-31 NOTE — Progress Notes (Signed)
 [facility]ematology and Oncology Follow Up Visit  Bruce Smith [id number] 10-12-1951 73 y.o. 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 - t(11:14), 13q-, 1p-,1q-  Past Therapy: Status post surgical repair of left femur-07/14/2020 XRT to left shoulder and left hip Xgeva 120 mg subcu every 3 months-next dose in December 2022  Faspro/Velcade/Revlimid/Decadron -- s/p cycle #2 -- start on       08/06/2020  ( Revlimid is 14 on /14 off)    Current Therapy:        ASCT -- [facility] on -01/22/2021 Faspro - q month -- maintenance -- start on 05/13/2021 -- d/c on 10/10/2023 Zometa 4 mg IV every 3 months-next dose in 02/2024  Radiation therapy to T12, left iliac and left humerus --completed on 10/04/2022 --XRT to right femoral head -start 01/03/2023 XRT to T9 lesion -to be completed on 06/18/2023 Kyprolis/Pomalyst -- s/p cycle #3 - start on 10/19/2023 IVIG 40 g monthly    Interim History:  Bruce Smith is here today for follow-up.  It sounds like the CAR-T therapy will be done on April 14.  He goes back on April 9 I think for treatment.  He has a bone marrow biopsy later on this week.  He goes for his PET scan tomorrow.  He continues on the pomalidomide.  He has stopped the Kyprolis.  He is doing well on the pomalidomide.  He has had no problems with rashes.  There is been no diarrhea.  He has had no cough or shortness of breath.  He has had no leg swelling.  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  I cannot believe that he has already been a year.  He has done incredibly well with this.  He is still followed by cardiology.  When we last saw him, his Kappa light chain was 1.8 mg/dL.  He will do a 24-hour urine.  I told him to try to give [place] is much as possible.  His last IgG level was 661 mg/dL.  This really has helped  with infection prevention.  He has had no bleeding.  There has been no mouth sores.  He has had no headache.  Overall, I will have to say that his performance status is probably ECOG 1.    Medications:  Allergies as of 01/31/2024   No Known Allergies      Medication List        Accurate as of January 31, 2024 10:00 AM. If you have any questions, ask your nurse or doctor.          acetaminophen 500 MG tablet Commonly known as: TYLENOL Take 1,000 mg by mouth every 4 (four) hours.   ALLERGY PO Take 10 mg by mouth daily.   aspirin EC 81 MG tablet Take 81 mg by mouth at bedtime.   benzonatate 200 MG capsule Commonly known as: TESSALON Take 1 capsule (200 mg total) by mouth 3 (three) times daily as needed for cough.   CALCIUM 600 PO Take 600 mg by mouth daily.   dexamethasone 4 MG tablet Commonly known as: DECADRON Take 5 tablets (20 mg) for 1 day on the week after chemotherapy (day 22).   DULoxetine 60 MG capsule Commonly known as: CYMBALTA Take 1 capsule (60 mg total) by mouth daily.   famciclovir 250 MG tablet Commonly known  as: FAMVIR Take 1 tablet (250 mg total) by mouth daily.   guaiFENesin 600 MG 12 hr tablet Commonly known as: MUCINEX Take 1 tablet (600 mg total) by mouth 2 (two) times daily as needed for to loosen phlegm.   Ipratropium-Albuterol 20-100 MCG/ACT Aers respimat Commonly known as: COMBIVENT Inhale 1 puff into the lungs every 6 (six) hours as needed for wheezing.   levETIRAcetam 500 MG tablet Commonly known as: KEPPRA TAKE 1 TABLET BY MOUTH 2 TIMES A DAY   Magnesium 250 MG Caps Take 250 mg by mouth daily.   metoprolol succinate 50 MG 24 hr tablet Commonly known as: TOPROL-XL Take 50 mg by mouth daily.   naloxone 4 MG/0.1ML Liqd nasal spray kit Commonly known as: Engineer, manufacturing systems as directed in each nostril in the event of an overdose. May repeat in 15 minutes.   nitroGLYCERIN 0.4 MG SL tablet Commonly known as: NITROSTAT Place 0.4  mg under the tongue every 5 (five) minutes as needed.   ondansetron 8 MG tablet Commonly known as: Zofran Take 1 tablet (8 mg total) by mouth every 8 (eight) hours as needed for nausea or vomiting.   pantoprazole 40 MG tablet Commonly known as: PROTONIX TAKE 1 TABLET BY MOUTH 2 TIMES A DAY BEFORE MEALS FOR ACID REFLUX   polyethylene glycol powder 17 GM/SCOOP powder Commonly known as: GLYCOLAX/MIRALAX Take 17 g by mouth daily.   pomalidomide 3 MG capsule Commonly known as: Pomalyst Take 1 capsule (3 mg total) by mouth daily. [employer] Auth #  [id number]  Date Obtained 01/19/24   pregabalin 200 MG capsule Commonly known as: LYRICA TAKE 1 CAPSULE BY MOUTH 2 TIMES DAILY   prochlorperazine 10 MG tablet Commonly known as: COMPAZINE Take 1 tablet (10 mg total) by mouth every 6 (six) hours as needed for nausea or vomiting.   rosuvastatin 40 MG tablet Commonly known as: CRESTOR TAKE 1 TABLET BY MOUTH EVERY DAY FOR CHOLESTEROL   saccharomyces boulardii 250 MG capsule Commonly known as: FLORASTOR Take 250 mg by mouth daily.   tamsulosin 0.4 MG Caps capsule Commonly known as: FLOMAX TAKE 1 CAPSULE BY MOUTH EVERY NIGHT AT BEDTIME   traMADol 50 MG tablet Commonly known as: ULTRAM TAKE 2 TABLETS BY MOUTH EVERY 6 HOURS ASNEEDED FOR PAIN        Allergies: No Known Allergies  Past Medical History, Surgical history, Social history, and Family History were reviewed and updated.  Review of Systems: Review of Systems  Constitutional: Negative.   HENT: Negative.    Eyes: Negative.   Respiratory: Negative.    Cardiovascular: Negative.   Gastrointestinal: Negative.   Genitourinary: Negative.   Musculoskeletal:  Positive for joint pain.  Skin: Negative.   Neurological: Negative.   Endo/Heme/Allergies: Negative.   Psychiatric/Behavioral: Negative.       Physical Exam:  height is 5\' 8"  (1.727 m) and weight is 205 lb 0.6 oz (93 kg). His oral temperature is 98.1 F (36.7 C). His  blood pressure is 134/55 (abnormal) and his pulse is 58 (abnormal). His respiration is 20 and oxygen saturation is 96%.   Wt Readings from Last 3 Encounters:  01/31/24 205 lb 0.6 oz (93 kg)  01/01/24 209 lb (94.8 kg)  11/30/23 201 lb (91.2 kg)    Physical Exam Vitals reviewed.  Constitutional:      Comments: This is a well-developed and well-nourished white male in no obvious distress.  He has a nicely healed median sternotomy scar.  HENT:  Head: Normocephalic and atraumatic.  Eyes:     Pupils: Pupils are equal, round, and reactive to light.  Cardiovascular:     Rate and Rhythm: Normal rate and regular rhythm.     Heart sounds: Normal heart sounds.  Pulmonary:     Effort: Pulmonary effort is normal.     Breath sounds: Normal breath sounds.  Abdominal:     General: Bowel sounds are normal.     Palpations: Abdomen is soft.  Musculoskeletal:        General: No tenderness or deformity. Normal range of motion.     Cervical back: Normal range of motion.     Comments: There is pain to palpation in the left hip area.  This is in the lateral hip area.  He has some decreased range of motion.  Lymphadenopathy:     Cervical: No cervical adenopathy.  Skin:    General: Skin is warm and dry.     Findings: No erythema or rash.  Neurological:     Mental Status: He is alert and oriented to person, place, and time.  Psychiatric:        Behavior: Behavior normal.        Thought Content: Thought content normal.        Judgment: Judgment normal.      Lab Results  Component Value Date   WBC 3.8 (L) 01/31/2024   HGB 12.1 (L) 01/31/2024   HCT 36.4 (L) 01/31/2024   MCV 98.9 01/31/2024   PLT 132 (L) 01/31/2024   Lab Results  Component Value Date   FERRITIN 135 11/15/2023   IRON 35 (L) 11/15/2023   TIBC 225 (L) 11/15/2023   UIBC 190 11/15/2023   IRONPCTSAT 16 (L) 11/15/2023   Lab Results  Component Value Date   RETICCTPCT 1.3 11/14/2023   RBC 3.68 (L) 01/31/2024   Lab Results   Component Value Date   KPAFRELGTCHN 17.8 01/01/2024   LAMBDASER 6.3 01/01/2024   KAPLAMBRATIO 2.83 (H) 01/01/2024   Lab Results  Component Value Date   IGGSERUM 661 01/01/2024   IGA 26 (L) 01/01/2024   IGMSERUM 20 01/01/2024   Lab Results  Component Value Date   TOTALPROTELP 5.6 (L) 11/30/2023   ALBUMINELP 3.1 11/30/2023   A1GS 0.3 11/30/2023   A2GS 0.8 11/30/2023   BETS 0.7 11/30/2023   GAMS 0.7 11/30/2023   MSPIKE Not Observed 11/30/2023     Chemistry      Component Value Date/Time   NA 139 01/15/2024 0816   K 4.5 01/15/2024 0816   CL 106 01/15/2024 0816   CO2 25 01/15/2024 0816   BUN 22 01/15/2024 0816   CREATININE 1.30 (H) 01/15/2024 0816      Component Value Date/Time   CALCIUM 9.0 01/15/2024 0816   ALKPHOS 91 01/15/2024 0816   AST 16 01/15/2024 0816   ALT 51 (H) 01/15/2024 0816   BILITOT 0.4 01/15/2024 0816       Impression and Plan: Mr. Bonito is a very pleasant 73 yo gentleman with 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   We will give the IVIG today.  Again, he is stopped the Kyprolis.  He will finish out the pomalidomide right around the time of the CAR-T therapy.  I am just very happy that he is going be able to have the CAR-T therapy.  He really has done nicely.  I will subsequently plan to get him back depending on when [facility] says  he is ready to come back to see [place].  I would have to think that this is into be sometime in May.    Josph Macho, MD 4/2/202510:00 AM

## 2024-01-31 NOTE — Progress Notes (Signed)
 Pt states he took Tylenol and Benadryl at home prior to arrival in clinic.

## 2024-01-31 NOTE — Patient Instructions (Signed)
 Immune Globulin Injection What is this medication? IMMUNE GLOBULIN (im MUNE GLOB yoo lin) treats many immune system conditions. It works by Designer, multimedia extra antibodies. Antibodies are proteins made by the immune system that help protect the body. This medicine may be used for other purposes; ask your health care provider or pharmacist if you have questions. COMMON BRAND NAME(S): ASCENIV, Baygam, BIVIGAM, Carimune, Carimune NF, cutaquig, Cuvitru, Flebogamma, Flebogamma DIF, GamaSTAN, GamaSTAN S/D, Gamimune N, Gammagard, Gammagard S/D, Gammaked, Gammaplex, Gammar-P IV, Gamunex, Gamunex-C, Hizentra, Iveegam, Iveegam EN, Octagam, Panglobulin, Panglobulin NF, panzyga, Polygam S/D, Privigen, Sandoglobulin, Venoglobulin-S, Vigam, Vivaglobulin, Xembify What should I tell my care team before I take this medication? They need to know if you have any of these conditions: Blood clotting disorder Condition where you have excess fluid in your body, such as heart failure or edema Dehydration Diabetes Have had blood clots Heart disease Immune system conditions Kidney disease Low levels of IgA Recent or upcoming vaccine An unusual or allergic reaction to immune globulin, other medications, foods, dyes, or preservatives Pregnant or trying to get pregnant Breastfeeding How should I use this medication? This medication is infused into a vein or under the skin. It is usually given by your care team in a hospital or clinic setting. It may also be given at home. If you get this medication at home, you will be taught how to prepare and give it. Use exactly as directed. Take it as directed on the prescription label at the same time every day. Keep taking it unless your care team tells you to stop. It is important that you put your used needles and syringes in a special sharps container. Do not put them in a trash can. If you do not have a sharps container, call your pharmacist or care team to get one. Talk to  your care team about the use of this medication in children. While it may be given to children for selected conditions, precautions do apply. Overdosage: If you think you have taken too much of this medicine contact a poison control center or emergency room at once. NOTE: This medicine is only for you. Do not share this medicine with others. What if I miss a dose? If you get this medication at the hospital or clinic: It is important not to miss your dose. Call your care team if you are unable to keep an appointment. If you give yourself this medication at home: If you miss a dose, take it as soon as you can. Then continue your normal schedule. If it is almost time for your next dose, take only that dose. Do not take double or extra doses. Call your care team with questions. What may interact with this medication? Live virus vaccines This list may not describe all possible interactions. Give your health care provider a list of all the medicines, herbs, non-prescription drugs, or dietary supplements you use. Also tell them if you smoke, drink alcohol, or use illegal drugs. Some items may interact with your medicine. What should I watch for while using this medication? Your condition will be monitored carefully while you are receiving this medication. Tell your care team if your symptoms do not start to get better or if they get worse. You may need blood work done while you are taking this medication. This medication increases the risk of blood clots. People with heart, blood vessel, or blood clotting conditions are more likely to develop a blood clot. Other risk factors include advanced age, estrogen  use, tobacco use, lack of movement, and being overweight. This medication can decrease the response to a vaccine. If you need to get vaccinated, tell your care team if you have received this medication within the last year. Extra booster doses may be needed. Talk to your care team to see if a different  vaccination schedule is needed. If you have diabetes, you may get a falsely elevated blood sugar reading. Talk to your care team about how to check your blood sugar while taking this medication. What side effects may I notice from receiving this medication? Side effects that you should report to your care team as soon as possible: Allergic reactions--skin rash, itching, hives, swelling of the face, lips, tongue, or throat Blood clot--pain, swelling, or warmth in the leg, shortness of breath, chest pain Fever, neck pain or stiffness, sensitivity to light, headache, nausea, vomiting, confusion, which may be signs of meningitis Hemolytic anemia--unusual weakness or fatigue, dizziness, headache, trouble breathing, dark urine, yellowing skin or eyes Kidney injury--decrease in the amount of urine, swelling of the ankles, hands, or feet Low sodium level--muscle weakness, fatigue, dizziness, headache, confusion Shortness of breath or trouble breathing, cough, unusual weakness or fatigue, blue skin or lips Side effects that usually do not require medical attention (report these to your care team if they continue or are bothersome): Chills Diarrhea Fever Headache Nausea This list may not describe all possible side effects. Call your doctor for medical advice about side effects. You may report side effects to FDA at 1-800-FDA-1088. Where should I keep my medication? Keep out of the reach of children and pets. You will be instructed on how to store this medication. Get rid of any unused medication after the expiration date. To get rid of medications that are no longer needed or have expired: Take the medication to a medication take-back program. Check with your pharmacy or law enforcement to find a location. If you cannot return the medication, ask your pharmacist or care team how to get rid of this medication safely. NOTE: This sheet is a summary. It may not cover all possible information. If you have  questions about this medicine, talk to your doctor, pharmacist, or health care provider.  2024 Elsevier/Gold Standard (2023-09-29 00:00:00)

## 2024-02-01 ENCOUNTER — Other Ambulatory Visit (HOSPITAL_COMMUNITY): Payer: Self-pay | Admitting: Radiology

## 2024-02-01 ENCOUNTER — Ambulatory Visit (HOSPITAL_COMMUNITY)
Admission: RE | Admit: 2024-02-01 | Discharge: 2024-02-01 | Disposition: A | Discharge status: Home / Self Care | Source: Ambulatory Visit | Attending: Hematology & Oncology | Admitting: Hematology & Oncology

## 2024-02-01 ENCOUNTER — Other Ambulatory Visit: Payer: Self-pay | Admitting: Student

## 2024-02-01 DIAGNOSIS — E785 Hyperlipidemia, unspecified: Secondary | ICD-10-CM | POA: Diagnosis not present

## 2024-02-01 DIAGNOSIS — C9001 Multiple myeloma in remission: Secondary | ICD-10-CM | POA: Diagnosis present

## 2024-02-01 DIAGNOSIS — C9000 Multiple myeloma not having achieved remission: Secondary | ICD-10-CM | POA: Insufficient documentation

## 2024-02-01 DIAGNOSIS — I2510 Atherosclerotic heart disease of native coronary artery without angina pectoris: Secondary | ICD-10-CM | POA: Diagnosis not present

## 2024-02-01 DIAGNOSIS — Z1379 Encounter for other screening for genetic and chromosomal anomalies: Secondary | ICD-10-CM | POA: Diagnosis not present

## 2024-02-01 DIAGNOSIS — Z951 Presence of aortocoronary bypass graft: Secondary | ICD-10-CM | POA: Diagnosis not present

## 2024-02-01 DIAGNOSIS — I10 Essential (primary) hypertension: Secondary | ICD-10-CM | POA: Diagnosis not present

## 2024-02-01 DIAGNOSIS — I252 Old myocardial infarction: Secondary | ICD-10-CM | POA: Insufficient documentation

## 2024-02-01 DIAGNOSIS — Z9481 Bone marrow transplant status: Secondary | ICD-10-CM | POA: Diagnosis not present

## 2024-02-01 LAB — IGG, IGA, IGM
IgA: 27 mg/dL — ABNORMAL LOW (ref 61–437)
IgG (Immunoglobin G), Serum: 540 mg/dL — ABNORMAL LOW (ref 603–1613)
IgM (Immunoglobulin M), Srm: 20 mg/dL (ref 15–143)

## 2024-02-01 LAB — GLUCOSE, CAPILLARY: Glucose-Capillary: 86 mg/dL (ref 70–99)

## 2024-02-01 LAB — KAPPA/LAMBDA LIGHT CHAINS
Kappa free light chain: 24.5 mg/L — ABNORMAL HIGH (ref 3.3–19.4)
Kappa, lambda light chain ratio: 2.69 — ABNORMAL HIGH (ref 0.26–1.65)
Lambda free light chains: 9.1 mg/L (ref 5.7–26.3)

## 2024-02-01 MED ORDER — FLUDEOXYGLUCOSE F - 18 (FDG) INJECTION
10.2000 | Freq: Once | INTRAVENOUS | Status: AC
  Administered 2024-02-01: 10.2 via INTRAVENOUS

## 2024-02-01 NOTE — H&P (Incomplete)
 Chief Complaint: Kappa light chain myeloma-extensive bone disease-pathologic fracture of left scapula and left femur ; referred for CT guided bone marrow biopsy for further evaluation  Referring Provider(s): Ennever,P  Supervising Physician: Irish Lack  Patient Status: [facility] - Out-pt  History of Present Illness: Bruce Smith is a 73 y.o. 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 He is scheduled today for CT guided bone marrow biopsy for further evaluation.   *** Patient is Full Code  Past Medical History:  Diagnosis Date   Atherosclerosis    Coronary artery disease    Goals of care, counseling/discussion 06/29/2020   History of radiation therapy    Left Humerus, T-spine, Pelvis- 09/19/22-10/04/22- Dr. Antony Blackbird   History of radiation therapy    Right Femur- 01/15/23-01/16/23- Dr. Antony Blackbird   History of radiation therapy    Thoracic spine- 06/06/23-06/19/23- Dr. 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   Multiple myeloma (HCC) 07/21/2020   Myocardial infarction [facility])     Past Surgical History:  Procedure Laterality Date   ANTERIOR CERVICAL DECOMP/DISCECTOMY FUSION N/A 06/17/2020   Procedure: ANTERIOR CERVICAL DECOMPRESSION/DISCECTOMY FUSION, INTERBODY PROSTHESIS, PLATE/SCREWS CERVICAL FIVE- CERVICAL SIX;  Surgeon: Tressie Stalker, MD;  Location: [facility] OR;  Service: Neurosurgery;  Laterality: N/A;  ANTERIOR CERVICAL DECOMPRESSION/DISCECTOMY FUSION, INTERBODY PROSTHESIS, PLATE/SCREWS CERVICAL FIVE- CERVICAL SIX   BIOPSY  02/24/2021   Procedure: BIOPSY;  Surgeon: Kathi Der, MD;  Location: [facility] ENDOSCOPY;  Service: Gastroenterology;;   CARDIAC CATHETERIZATION     CORONARY ARTERY  BYPASS GRAFT N/A 03/13/2023   Procedure: CORONARY ARTERY BYPASS GRAFTING (CABG) X3, USING RIGHT LEG GREATER SAPHENOUS VEIN HARVESTED ENDOSCOPICALLY & LEFT IMA;  Surgeon: Alleen Borne, MD;  Location: [facility] OR;  Service: Open Heart Surgery;  Laterality: N/A;   ESOPHAGOGASTRODUODENOSCOPY N/A 02/24/2021   Procedure: ESOPHAGOGASTRODUODENOSCOPY (EGD);  Surgeon: Kathi Der, MD;  Location: [facility];  Service: Gastroenterology;  Laterality: N/A;   FEMUR IM NAIL Left 07/14/2020   Procedure: OPEN REDUCTION INTERNAL FIXATION (ORIF LEFT FEMORAL NAIL FRACTURE;  Surgeon: Teryl Lucy, MD;  Location: [facility] OR;  Service: Orthopedics;  Laterality: Left;   LEFT HEART CATH AND CORONARY ANGIOGRAPHY N/A 03/09/2023   Procedure: LEFT HEART CATH AND CORONARY ANGIOGRAPHY;  Surgeon: Runell Gess, MD;  Location: [facility] INVASIVE CV LAB;  Service: Cardiovascular;  Laterality: N/A;   TEE WITHOUT CARDIOVERSION N/A 03/13/2023   Procedure: TRANSESOPHAGEAL ECHOCARDIOGRAM;  Surgeon: Alleen Borne, MD;  Location: [facility] OR;  Service: Open Heart Surgery;  Laterality: N/A;    Allergies: Patient has no known allergies.  Medications: Prior to Admission medications   Medication Sig Start Date End Date Taking? Authorizing Provider  acetaminophen (TYLENOL) 500 MG tablet Take 1,000 mg by mouth every 4 (four) hours.    [provider]  aspirin EC 81 MG tablet Take 81 mg by mouth at bedtime.    [provider]  benzonatate (TESSALON) 200 MG capsule Take 1 capsule (200 mg total) by mouth 3 (three) times daily as needed for cough. Patient not taking: Reported on 01/31/2024 11/16/23   Calvert Cantor, MD  Calcium Carbonate (CALCIUM 600 PO) Take 600 mg by mouth daily.    [provider]  Chlorpheniramine Maleate (ALLERGY PO) Take 10 mg  by mouth daily.    [provider]  dexamethasone (DECADRON) 4 MG tablet Take 5 tablets (20 mg) for 1 day on the week after chemotherapy (day 22). 10/11/23   Josph Macho,  MD  DULoxetine (CYMBALTA) 60 MG capsule Take 1 capsule (60 mg total) by mouth daily. 01/15/24   Josph Macho, MD  famciclovir (FAMVIR) 250 MG tablet Take 1 tablet (250 mg total) by mouth daily. 10/19/23   Josph Macho, MD  guaiFENesin (MUCINEX) 600 MG 12 hr tablet Take 1 tablet (600 mg total) by mouth 2 (two) times daily as needed for to loosen phlegm. Patient not taking: Reported on 01/31/2024 11/16/23   Calvert Cantor, MD  Ipratropium-Albuterol (COMBIVENT) 20-100 MCG/ACT AERS respimat Inhale 1 puff into the lungs every 6 (six) hours as needed for wheezing. Patient not taking: Reported on 01/31/2024 08/03/23   Josph Macho, MD  levETIRAcetam (KEPPRA) 500 MG tablet TAKE 1 TABLET BY MOUTH 2 TIMES A DAY 11/24/23   Josph Macho, MD  Magnesium 250 MG CAPS Take 250 mg by mouth daily.    [provider]  metoprolol succinate (TOPROL-XL) 50 MG 24 hr tablet Take 50 mg by mouth daily. 10/24/23   [provider]  naloxone [facility]) nasal spray 4 mg/0.1 mL Administer as directed in each nostril in the event of an overdose. May repeat in 15 minutes. Patient not taking: Reported on 12/14/2023 10/17/23   Josph Macho, MD  nitroGLYCERIN (NITROSTAT) 0.4 MG SL tablet Place 0.4 mg under the tongue every 5 (five) minutes as needed. Patient not taking: Reported on 01/31/2024 06/09/23   [provider]  ondansetron (ZOFRAN) 8 MG tablet Take 1 tablet (8 mg total) by mouth every 8 (eight) hours as needed for nausea or vomiting. Patient not taking: Reported on 01/31/2024 10/11/23   Josph Macho, MD  pantoprazole (PROTONIX) 40 MG tablet TAKE 1 TABLET BY MOUTH 2 TIMES A DAY BEFORE MEALS FOR ACID REFLUX 10/23/23   Josph Macho, MD  polyethylene glycol powder (GLYCOLAX/MIRALAX) 17 GM/SCOOP powder Take 17 g by mouth daily. Patient not taking: Reported on 01/31/2024    [provider]  pomalidomide (POMALYST) 3 MG capsule Take 1 capsule (3 mg total) by mouth daily. [employer] Auth #   [id number]  Date Obtained 01/19/24 01/23/24   Josph Macho, MD  pregabalin (LYRICA) 200 MG capsule TAKE 1 CAPSULE BY MOUTH 2 TIMES DAILY 08/08/23   Josph Macho, MD  prochlorperazine (COMPAZINE) 10 MG tablet Take 1 tablet (10 mg total) by mouth every 6 (six) hours as needed for nausea or vomiting. Patient not taking: Reported on 01/31/2024 10/11/23   Josph Macho, MD  rosuvastatin (CRESTOR) 40 MG tablet TAKE 1 TABLET BY MOUTH EVERY DAY FOR CHOLESTEROL 10/23/23   Josph Macho, MD  saccharomyces boulardii (FLORASTOR) 250 MG capsule Take 250 mg by mouth daily. 03/17/21   [provider]  tamsulosin (FLOMAX) 0.4 MG CAPS capsule TAKE 1 CAPSULE BY MOUTH EVERY NIGHT AT BEDTIME 01/04/24   Josph Macho, MD  traMADol (ULTRAM) 50 MG tablet TAKE 2 TABLETS BY MOUTH EVERY 6 HOURS ASNEEDED FOR PAIN 12/22/23   Josph Macho, MD     Family History  Problem Relation Age of Onset   Stroke Mother    Prostate cancer Paternal Grandfather     Social History   Socioeconomic History   Marital status: Married    Spouse name: Not on file   Number  of children: Not on file   Years of education: Not on file   Highest education level: Not on file  Occupational History   Not on file  Tobacco Use   Smoking status: Former    Current packs/day: 0.00    Average packs/day: 1 pack/day for 15.0 years (15.0 ttl pk-yrs)    Types: Cigarettes    Start date: 11/01/1983    Quit date: 10/31/1998    Years since quitting: 25.2   Smokeless tobacco: Current    Types: Chew  Vaping Use   Vaping status: Never Used  Substance and Sexual Activity   Alcohol use: Not Currently    Comment: rare   Drug use: Never   Sexual activity: Not Currently  Other Topics Concern   Not on file  Social History Narrative   Not on file   Social Drivers of Health   Financial Resource Strain: Low Risk  (12/11/2023)   Received from [facility]   Overall Financial Resource Strain (CARDIA)    Difficulty of Paying Living  Expenses: Not hard at all  Food Insecurity: No Food Insecurity (12/11/2023)   Received from [facility]   Hunger Vital Sign    Worried About Running Out of Food in the Last Year: Never true    Ran Out of Food in the Last Year: Never true  Transportation Needs: No Transportation Needs (12/11/2023)   Received from [facility] - Transportation    Lack of Transportation (Medical): No    Lack of Transportation (Non-Medical): No  Physical Activity: Not on file  Stress: Not on file  Social Connections: Moderately Integrated (11/13/2023)   Social Connection and Isolation Panel [NHANES]    Frequency of Communication with Friends and Family: More than three times a week    Frequency of Social Gatherings with Friends and Family: Twice a week    Attends Religious Services: 1 to 4 times per year    Active Member of [employer] or Organizations: No    Attends Banker Meetings: Never    Marital Status: Married       Review of Systems  Vital Signs:   Advance Care Plan: no documents on file  Physical Exam  Imaging: No results found.  Labs:  CBC: Recent Labs    01/01/24 0826 01/08/24 0857 01/15/24 0816 01/31/24 0923  WBC 4.0 3.7* 5.4 3.8*  HGB 11.4* 12.0* 12.1* 12.1*  HCT 34.3* 35.7* 36.5* 36.4*  PLT 112* 95* 72* 132*    COAGS: Recent Labs    03/09/23 0304 03/13/23 1244 11/13/23 0456  INR 1.0 1.2 1.3*  APTT  --  35 35    BMP: Recent Labs    01/01/24 0826 01/08/24 0857 01/15/24 0816 01/31/24 0923  NA 141 144 139 141  K 4.1 3.7 4.5 4.1  CL 109 111 106 108  CO2 26 24 25 27   GLUCOSE 92 118* 91 86  BUN 20 20 22 20   CALCIUM 9.3 9.4 9.0 9.0  CREATININE 1.40* 1.24 1.30* 1.18  GFRNONAA 53* >60 58* >60    LIVER FUNCTION TESTS: Recent Labs    01/01/24 0826 01/08/24 0857 01/15/24 0816 01/31/24 0923  BILITOT 0.3 0.4 0.4 0.4  AST 19 14* 16 23  ALT 39 36 51* 64*  ALKPHOS 101 96 91 91  PROT 5.7* 5.8* 5.6* 5.6*  ALBUMIN 3.7 3.7 3.8 3.8     TUMOR MARKERS: No results for input(s): "AFPTM", "CEA", "CA199", "CHROMGRNA" in the last 8760 hours.  Assessment and Plan: 74 y.o. 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 He is scheduled today for CT guided bone marrow biopsy for further evaluation. Risks and benefits of procedure was discussed with the patient  including, but not limited to bleeding, infection, damage to adjacent structures or low yield requiring additional tests.  All of the questions were answered and there is agreement to proceed.  Consent signed and in chart.  Pt known to IR team from left scapula lesion bx 2021, BM bx 2021 x 2, BM bx 2022/2023/2024.  Thank you for allowing our service to participate in Bruce Smith 's care.  Electronically Signed: D. Jeananne Rama, PA-C   02/01/2024, 3:48 PM      I spent a total of    15 Minutes in face to face in clinical consultation, greater than 50% of which was counseling/coordinating care for CT guided bone marrow biopsy

## 2024-02-02 ENCOUNTER — Ambulatory Visit (HOSPITAL_COMMUNITY)
Admission: RE | Admit: 2024-02-02 | Discharge: 2024-02-02 | Disposition: A | Discharge status: Home / Self Care | Source: Ambulatory Visit | Attending: Hematology & Oncology | Admitting: Hematology & Oncology

## 2024-02-02 ENCOUNTER — Encounter (HOSPITAL_COMMUNITY): Payer: Self-pay

## 2024-02-02 DIAGNOSIS — C9000 Multiple myeloma not having achieved remission: Secondary | ICD-10-CM

## 2024-02-02 DIAGNOSIS — C9001 Multiple myeloma in remission: Secondary | ICD-10-CM

## 2024-02-02 LAB — CBC WITH DIFFERENTIAL/PLATELET
Abs Immature Granulocytes: 0.02 10*3/uL (ref 0.00–0.07)
Basophils Absolute: 0.1 10*3/uL (ref 0.0–0.1)
Basophils Relative: 2 %
Eosinophils Absolute: 0.2 10*3/uL (ref 0.0–0.5)
Eosinophils Relative: 6 %
HCT: 37.9 % — ABNORMAL LOW (ref 39.0–52.0)
Hemoglobin: 11.9 g/dL — ABNORMAL LOW (ref 13.0–17.0)
Immature Granulocytes: 1 %
Lymphocytes Relative: 17 %
Lymphs Abs: 0.4 10*3/uL — ABNORMAL LOW (ref 0.7–4.0)
MCH: 32.9 pg (ref 26.0–34.0)
MCHC: 31.4 g/dL (ref 30.0–36.0)
MCV: 104.7 fL — ABNORMAL HIGH (ref 80.0–100.0)
Monocytes Absolute: 0.7 10*3/uL (ref 0.1–1.0)
Monocytes Relative: 30 %
Neutro Abs: 1.1 10*3/uL — ABNORMAL LOW (ref 1.7–7.7)
Neutrophils Relative %: 44 %
Platelets: 118 10*3/uL — ABNORMAL LOW (ref 150–400)
RBC: 3.62 MIL/uL — ABNORMAL LOW (ref 4.22–5.81)
RDW: 15.4 % (ref 11.5–15.5)
WBC: 2.5 10*3/uL — ABNORMAL LOW (ref 4.0–10.5)
nRBC: 0 % (ref 0.0–0.2)

## 2024-02-02 MED ORDER — MIDAZOLAM HCL 2 MG/2ML IJ SOLN
INTRAMUSCULAR | Status: AC | PRN
  Administered 2024-02-02 (×2): 1 mg via INTRAVENOUS

## 2024-02-02 MED ORDER — MIDAZOLAM HCL 2 MG/2ML IJ SOLN
INTRAMUSCULAR | Status: AC
  Filled 2024-02-02: qty 4

## 2024-02-02 MED ORDER — FENTANYL CITRATE (PF) 100 MCG/2ML IJ SOLN
INTRAMUSCULAR | Status: AC
  Filled 2024-02-02: qty 2

## 2024-02-02 MED ORDER — FENTANYL CITRATE (PF) 100 MCG/2ML IJ SOLN
INTRAMUSCULAR | Status: AC | PRN
  Administered 2024-02-02 (×2): 50 ug via INTRAVENOUS

## 2024-02-02 MED ORDER — SODIUM CHLORIDE 0.9 % IV SOLN: INTRAVENOUS | Status: DC

## 2024-02-02 NOTE — Procedures (Signed)
 Interventional Radiology Procedure Note  Procedure: CT guided bone marrow aspiration and biopsy  Complications: None  EBL: < 10 mL  Findings: Aspirate and core biopsy performed of bone marrow in right iliac bone.  Plan: Bedrest supine x 1 hrs  Ericha Whittingham T. Fredia Sorrow, M.D Pager:  [phone]

## 2024-02-05 ENCOUNTER — Inpatient Hospital Stay

## 2024-02-05 ENCOUNTER — Other Ambulatory Visit: Payer: Self-pay | Admitting: Hematology & Oncology

## 2024-02-05 DIAGNOSIS — C419 Malignant neoplasm of bone and articular cartilage, unspecified: Secondary | ICD-10-CM

## 2024-02-05 DIAGNOSIS — E440 Moderate protein-calorie malnutrition: Secondary | ICD-10-CM

## 2024-02-05 DIAGNOSIS — D801 Nonfamilial hypogammaglobulinemia: Secondary | ICD-10-CM

## 2024-02-05 DIAGNOSIS — C9000 Multiple myeloma not having achieved remission: Secondary | ICD-10-CM

## 2024-02-05 LAB — PROTEIN ELECTROPHORESIS, SERUM, WITH REFLEX
A/G Ratio: 1.6 (ref 0.7–1.7)
Albumin ELP: 3.2 g/dL (ref 2.9–4.4)
Alpha-1-Globulin: 0.2 g/dL (ref 0.0–0.4)
Alpha-2-Globulin: 0.7 g/dL (ref 0.4–1.0)
Beta Globulin: 0.6 g/dL — ABNORMAL LOW (ref 0.7–1.3)
Gamma Globulin: 0.4 g/dL (ref 0.4–1.8)
Globulin, Total: 2 g/dL — ABNORMAL LOW (ref 2.2–3.9)
Total Protein ELP: 5.2 g/dL — ABNORMAL LOW (ref 6.0–8.5)

## 2024-02-06 LAB — SURGICAL PATHOLOGY

## 2024-02-07 ENCOUNTER — Encounter: Payer: Self-pay | Admitting: *Deleted

## 2024-02-07 ENCOUNTER — Other Ambulatory Visit: Payer: Self-pay | Admitting: Hematology & Oncology

## 2024-02-09 ENCOUNTER — Encounter: Payer: Self-pay | Admitting: *Deleted

## 2024-02-09 LAB — UPEP/UIFE/LIGHT CHAINS/TP, 24-HR UR
% BETA, Urine: 43.3 %
ALPHA 1 URINE: 3.5 %
Albumin, U: 16 %
Alpha 2, Urine: 15.9 %
Free Kappa Lt Chains,Ur: 44.9 mg/L (ref 1.17–86.46)
Free Kappa/Lambda Ratio: 6.76 (ref 1.83–14.26)
Free Lambda Lt Chains,Ur: 6.64 mg/L (ref 0.27–15.21)
GAMMA GLOBULIN URINE: 21.3 %
M-SPIKE %, Urine: 11.2 % — ABNORMAL HIGH
M-Spike, Mg/24 Hr: 22 mg/(24.h) — ABNORMAL HIGH
Total Protein, Urine-Ur/day: 196 mg/(24.h) — ABNORMAL HIGH (ref 30–150)
Total Protein, Urine: 7.4 mg/dL
Total Volume: 2650

## 2024-02-12 ENCOUNTER — Encounter (HOSPITAL_COMMUNITY): Payer: Self-pay | Admitting: Hematology & Oncology

## 2024-02-14 ENCOUNTER — Telehealth: Payer: Self-pay

## 2024-02-14 NOTE — Telephone Encounter (Signed)
 Call from [facility], stating CART on hold, patient has nodule on lung they want to biopsy first. They have also put the pomalyst on hold until after biopsy. They are scheduling biopsy at [facility], no date yet. Dr.Ennever informed of update.

## 2024-02-15 ENCOUNTER — Encounter: Payer: Self-pay | Admitting: Hematology & Oncology

## 2024-02-15 ENCOUNTER — Inpatient Hospital Stay

## 2024-02-15 ENCOUNTER — Inpatient Hospital Stay (HOSPITAL_BASED_OUTPATIENT_CLINIC_OR_DEPARTMENT_OTHER): Admitting: Hematology & Oncology

## 2024-02-15 ENCOUNTER — Other Ambulatory Visit: Payer: Self-pay

## 2024-02-15 VITALS — BP 120/57 | HR 54 | Temp 97.8°F | Resp 18 | Ht 68.0 in | Wt 206.0 lb

## 2024-02-15 DIAGNOSIS — C419 Malignant neoplasm of bone and articular cartilage, unspecified: Secondary | ICD-10-CM

## 2024-02-15 DIAGNOSIS — D801 Nonfamilial hypogammaglobulinemia: Secondary | ICD-10-CM

## 2024-02-15 DIAGNOSIS — C9000 Multiple myeloma not having achieved remission: Secondary | ICD-10-CM | POA: Diagnosis not present

## 2024-02-15 LAB — CMP (CANCER CENTER ONLY)
ALT: 23 U/L (ref 0–44)
AST: 15 U/L (ref 15–41)
Albumin: 3.7 g/dL (ref 3.5–5.0)
Alkaline Phosphatase: 78 U/L (ref 38–126)
Anion gap: 8 (ref 5–15)
BUN: 26 mg/dL — ABNORMAL HIGH (ref 8–23)
CO2: 27 mmol/L (ref 22–32)
Calcium: 9.3 mg/dL (ref 8.9–10.3)
Chloride: 105 mmol/L (ref 98–111)
Creatinine: 1.24 mg/dL (ref 0.61–1.24)
GFR, Estimated: >60 mL/min (ref >60)
Glucose, Bld: 113 mg/dL — ABNORMAL HIGH (ref 70–99)
Potassium: 3.9 mmol/L (ref 3.5–5.1)
Sodium: 140 mmol/L (ref 135–145)
Total Bilirubin: 0.3 mg/dL (ref 0.0–1.2)
Total Protein: 5.9 g/dL — ABNORMAL LOW (ref 6.5–8.1)

## 2024-02-15 LAB — CBC WITH DIFFERENTIAL (CANCER CENTER ONLY)
Abs Immature Granulocytes: 0.02 10*3/uL (ref 0.00–0.07)
Basophils Absolute: 0 10*3/uL (ref 0.0–0.1)
Basophils Relative: 1 %
Eosinophils Absolute: 0.4 10*3/uL (ref 0.0–0.5)
Eosinophils Relative: 11 %
HCT: 35.5 % — ABNORMAL LOW (ref 39.0–52.0)
Hemoglobin: 11.8 g/dL — ABNORMAL LOW (ref 13.0–17.0)
Immature Granulocytes: 1 %
Lymphocytes Relative: 21 %
Lymphs Abs: 0.8 10*3/uL (ref 0.7–4.0)
MCH: 33.1 pg (ref 26.0–34.0)
MCHC: 33.2 g/dL (ref 30.0–36.0)
MCV: 99.7 fL (ref 80.0–100.0)
Monocytes Absolute: 0.7 10*3/uL (ref 0.1–1.0)
Monocytes Relative: 20 %
Neutro Abs: 1.7 10*3/uL (ref 1.7–7.7)
Neutrophils Relative %: 46 %
Platelet Count: 127 10*3/uL — ABNORMAL LOW (ref 150–400)
RBC: 3.56 MIL/uL — ABNORMAL LOW (ref 4.22–5.81)
RDW: 14.1 % (ref 11.5–15.5)
WBC Count: 3.7 10*3/uL — ABNORMAL LOW (ref 4.0–10.5)
nRBC: 0 % (ref 0.0–0.2)

## 2024-02-15 LAB — LACTATE DEHYDROGENASE: LDH: 135 U/L (ref 98–192)

## 2024-02-15 NOTE — Progress Notes (Signed)
 [facility] Hematology and Oncology Follow Up Visit  Bruce Smith [id number] 11/25/1950 73 y.o. 02/15/2024   Principle Diagnosis:  Kappa light chain myeloma-extensive bone disease-pathologic fracture of left scapula and left femur - t(11:14), 13q-, 1p-,1q-  Past Therapy: Status post surgical repair of left femur-07/14/2020 XRT to left shoulder and left hip Xgeva 120 mg subcu every 3 months-next dose in December 2022  Faspro/Velcade/Revlimid/Decadron -- s/p cycle #2 -- start on       08/06/2020  ( Revlimid is 14 on /14 off)    Current Therapy:        ASCT -- [facility] on -01/22/2021 Faspro - q month -- maintenance -- start on 05/13/2021 -- d/c on 10/10/2023 Zometa 4 mg IV every 3 months-next dose in 02/2024  Radiation therapy to T12, left iliac and left humerus --completed on 10/04/2022 --XRT to right femoral head -start 01/03/2023 XRT to T9 lesion -to be completed on 06/18/2023 Kyprolis/Pomalyst -- s/p cycle #3 - start on 10/19/2023 IVIG 40 g monthly    Interim History:  Bruce Smith is here today for an early visit.  Unfortunately, I think that we have a new problem.  He did have a PET scan that was done.  The PET scan was done on 02/01/2024.  The PET scan showed a large prevascular and AP window mass measuring 4.1 x 3.2 cm.  He had an SUV of 55.  There was some consolidation in the left upper lobe without metabolic activity.  There was some activity in the abdomen between the spine and right psoas muscle.  There is new peritoneal nodule that is noted.  There is no liver metastasis.  There was intense cortical activity on the shaft of the left femoral prostatic.  There is also activity at L5.  Unfortunately, I have a bad feeling that this is going to be a bronchogenic carcinoma.  He has a remote history of tobacco use.  He has chronic back issues.  He has had past radiotherapy for myelomatous lesions.  I had a very long talk with Bruce Smith and his wife.  I have known him for  several years.  I showed him the PET scan.  I explained to them that I really with the felt that we were looking at a new kind of cancer.  Apparently, he is going to have a bronchoscopy at [facility].  This is all being set up by Dr. Dolan Freiberg.  I think that if this is bronchogenic carcinoma, that he is not to be a candidate for CAR-T therapy.  I just think that this bronchogenic carcinoma, if that is what it is, is much bigger problem.  I think if this is bronchogenic carcinoma, we probably are looking at stage IV disease.  Again,  I had a long talk with he and his wife as to what the options are if this is a bronchogenic carcinoma.  We really need to find out the histology.  Is this a nonsmall cell or small cell carcinoma.  This will dictate the treatment.  Also explained that we send the specimens off for molecular analysis.  I am sure [facility] will do all this.  Remotely, this might be lymphoma.  However, I would be surprised if that were the case.  Again, he really is does not have much the way of symptoms.  He does have some back discomfort.  There is no hip pain.  He has had no problems with bowels or bladder.  He said no problems  with headache.  He has had no bleeding.  There is been no fever.  Overall, I would have to say that he has a performance status of ECOG 1.      Medications:  Allergies as of 02/15/2024   No Known Allergies      Medication List        Accurate as of February 15, 2024  2:41 PM. If you have any questions, ask your nurse or doctor.          acetaminophen 500 MG tablet Commonly known as: TYLENOL Take 1,000 mg by mouth every 4 (four) hours.   ALLERGY PO Take 10 mg by mouth daily.   aspirin EC 81 MG tablet Take 81 mg by mouth at bedtime.   benzonatate 200 MG capsule Commonly known as: TESSALON Take 1 capsule (200 mg total) by mouth 3 (three) times daily as needed for cough.   CALCIUM 600 PO Take 600 mg by mouth daily.   dexamethasone 4 MG tablet Commonly  known as: DECADRON Take 5 tablets (20 mg) for 1 day on the week after chemotherapy (day 22).   DULoxetine 60 MG capsule Commonly known as: CYMBALTA Take 1 capsule (60 mg total) by mouth daily.   famciclovir 250 MG tablet Commonly known as: FAMVIR TAKE 1 TABLET BY MOUTH EVERY DAY   guaiFENesin 600 MG 12 hr tablet Commonly known as: MUCINEX Take 1 tablet (600 mg total) by mouth 2 (two) times daily as needed for to loosen phlegm.   Ipratropium-Albuterol 20-100 MCG/ACT Aers respimat Commonly known as: COMBIVENT Inhale 1 puff into the lungs every 6 (six) hours as needed for wheezing.   levETIRAcetam 500 MG tablet Commonly known as: KEPPRA TAKE 1 TABLET BY MOUTH 2 TIMES A DAY   Magnesium 250 MG Caps Take 250 mg by mouth daily.   metoprolol succinate 50 MG 24 hr tablet Commonly known as: TOPROL-XL Take 50 mg by mouth daily.   naloxone 4 MG/0.1ML Liqd nasal spray kit Commonly known as: NARCAN Administer as directed in each nostril in the event of an overdose. May repeat in 15 minutes.   nitroGLYCERIN 0.4 MG SL tablet Commonly known as: NITROSTAT Place 0.4 mg under the tongue every 5 (five) minutes as needed.   ondansetron 8 MG tablet Commonly known as: Zofran Take 1 tablet (8 mg total) by mouth every 8 (eight) hours as needed for nausea or vomiting.   pantoprazole 40 MG tablet Commonly known as: PROTONIX TAKE 1 TABLET BY MOUTH 2 TIMES A DAY BEFORE MEALS FOR ACID REFLUX   polyethylene glycol powder 17 GM/SCOOP powder Commonly known as: GLYCOLAX/MIRALAX Take 17 g by mouth daily.   pomalidomide 3 MG capsule Commonly known as: Pomalyst Take 1 capsule (3 mg total) by mouth daily. [employer] Auth #  [id number]  Date Obtained 01/19/24   pregabalin 200 MG capsule Commonly known as: LYRICA TAKE 1 CAPSULE BY MOUTH 2 TIMES DAILY   prochlorperazine 10 MG tablet Commonly known as: COMPAZINE Take 1 tablet (10 mg total) by mouth every 6 (six) hours as needed for nausea or vomiting.    rosuvastatin 40 MG tablet Commonly known as: CRESTOR TAKE 1 TABLET BY MOUTH EVERY DAY FOR CHOLESTEROL   saccharomyces boulardii 250 MG capsule Commonly known as: FLORASTOR Take 250 mg by mouth daily.   tamsulosin 0.4 MG Caps capsule Commonly known as: FLOMAX TAKE 1 CAPSULE BY MOUTH EVERY NIGHT AT BEDTIME   traMADol 50 MG tablet Commonly known as: ULTRAM TAKE 2 TABLETS  BY MOUTH EVERY 6 HOURS ASNEEDED FOR PAIN        Allergies: No Known Allergies  Past Medical History, Surgical history, Social history, and Family History were reviewed and updated.  Review of Systems: Review of Systems  Constitutional: Negative.   HENT: Negative.    Eyes: Negative.   Respiratory: Negative.    Cardiovascular: Negative.   Gastrointestinal: Negative.   Genitourinary: Negative.   Musculoskeletal:  Positive for joint pain.  Skin: Negative.   Neurological: Negative.   Endo/Heme/Allergies: Negative.   Psychiatric/Behavioral: Negative.       Physical Exam:  height is 5\' 8"  (1.727 m) and weight is 206 lb (93.4 kg). His oral temperature is 97.8 F (36.6 C). His blood pressure is 120/57 (abnormal) and his pulse is 54 (abnormal). His respiration is 18 and oxygen saturation is 98%.   Wt Readings from Last 3 Encounters:  02/15/24 206 lb (93.4 kg)  01/31/24 205 lb 0.6 oz (93 kg)  01/01/24 209 lb (94.8 kg)    Physical Exam Vitals reviewed.  Constitutional:      Comments: This is a well-developed and well-nourished white male in no obvious distress.  He has a nicely healed median sternotomy scar.  HENT:     Head: Normocephalic and atraumatic.  Eyes:     Pupils: Pupils are equal, round, and reactive to light.  Cardiovascular:     Rate and Rhythm: Normal rate and regular rhythm.     Heart sounds: Normal heart sounds.  Pulmonary:     Effort: Pulmonary effort is normal.     Breath sounds: Normal breath sounds.  Abdominal:     General: Bowel sounds are normal.     Palpations: Abdomen is  soft.  Musculoskeletal:        General: No tenderness or deformity. Normal range of motion.     Cervical back: Normal range of motion.     Comments: There is pain to palpation in the left hip area.  This is in the lateral hip area.  He has some decreased range of motion.  Lymphadenopathy:     Cervical: No cervical adenopathy.  Skin:    General: Skin is warm and dry.     Findings: No erythema or rash.  Neurological:     Mental Status: He is alert and oriented to person, place, and time.  Psychiatric:        Behavior: Behavior normal.        Thought Content: Thought content normal.        Judgment: Judgment normal.      Lab Results  Component Value Date   WBC 3.7 (L) 02/15/2024   HGB 11.8 (L) 02/15/2024   HCT 35.5 (L) 02/15/2024   MCV 99.7 02/15/2024   PLT 127 (L) 02/15/2024   Lab Results  Component Value Date   FERRITIN 135 11/15/2023   IRON 35 (L) 11/15/2023   TIBC 225 (L) 11/15/2023   UIBC 190 11/15/2023   IRONPCTSAT 16 (L) 11/15/2023   Lab Results  Component Value Date   RETICCTPCT 1.3 11/14/2023   RBC 3.56 (L) 02/15/2024   Lab Results  Component Value Date   KPAFRELGTCHN 24.5 (H) 01/31/2024   LAMBDASER 9.1 01/31/2024   KAPLAMBRATIO 6.76 02/05/2024   Lab Results  Component Value Date   IGGSERUM 540 (L) 01/31/2024   IGA 27 (L) 01/31/2024   IGMSERUM 20 01/31/2024   Lab Results  Component Value Date   TOTALPROTELP 5.2 (L) 01/31/2024   ALBUMINELP 3.2 01/31/2024  A1GS 0.2 01/31/2024   A2GS 0.7 01/31/2024   BETS 0.6 (L) 01/31/2024   GAMS 0.4 01/31/2024   MSPIKE Not Observed 01/31/2024     Chemistry      Component Value Date/Time   NA 140 02/15/2024 1134   K 3.9 02/15/2024 1134   CL 105 02/15/2024 1134   CO2 27 02/15/2024 1134   BUN 26 (H) 02/15/2024 1134   CREATININE 1.24 02/15/2024 1134      Component Value Date/Time   CALCIUM 9.3 02/15/2024 1134   ALKPHOS 78 02/15/2024 1134   AST 15 02/15/2024 1134   ALT 23 02/15/2024 1134   BILITOT 0.3  02/15/2024 1134       Impression and Plan: Bruce Smith is a very pleasant 73 yo gentleman with 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   At this point, our focus clearly will have to be on diagnosing this likely new malignancy.  Again, I suspect this is going to be a bronchogenic carcinoma just by the appearance on the PET scan.  [facility] will certainly move this along quickly.  We will have to see what the results might be.  I am sure [facility] will send off all the necessary molecular test.  It sounds like everything will be done next week.  As far as any kind of systemic therapy and/or radiotherapy, this will really be dictated by the histology.  I will plan to have him come back to see us  when I get those results back.  We had a good prayer.  I know that they have a strong faith.  I know this was not the kind of news that they wanted to hear.  I have always promised them that I would tell them what is going on, good news or bad news.   Ivor Mars, MD 4/17/20252:41 PM

## 2024-02-16 ENCOUNTER — Other Ambulatory Visit: Payer: Self-pay

## 2024-02-16 LAB — IGG, IGA, IGM
IgA: 62 mg/dL (ref 61–437)
IgG (Immunoglobin G), Serum: 863 mg/dL (ref 603–1613)
IgM (Immunoglobulin M), Srm: 31 mg/dL (ref 15–143)

## 2024-02-19 LAB — KAPPA/LAMBDA LIGHT CHAINS
Kappa free light chain: 42.6 mg/L — ABNORMAL HIGH (ref 3.3–19.4)
Kappa, lambda light chain ratio: 2.38 — ABNORMAL HIGH (ref 0.26–1.65)
Lambda free light chains: 17.9 mg/L (ref 5.7–26.3)

## 2024-02-19 LAB — PROTEIN ELECTROPHORESIS, SERUM, WITH REFLEX
A/G Ratio: 1.2 (ref 0.7–1.7)
Albumin ELP: 3.1 g/dL (ref 2.9–4.4)
Alpha-1-Globulin: 0.3 g/dL (ref 0.0–0.4)
Alpha-2-Globulin: 0.8 g/dL (ref 0.4–1.0)
Beta Globulin: 0.7 g/dL (ref 0.7–1.3)
Gamma Globulin: 0.7 g/dL (ref 0.4–1.8)
Globulin, Total: 2.5 g/dL (ref 2.2–3.9)
Total Protein ELP: 5.6 g/dL — ABNORMAL LOW (ref 6.0–8.5)

## 2024-02-21 ENCOUNTER — Other Ambulatory Visit: Payer: Self-pay

## 2024-02-21 ENCOUNTER — Telehealth: Payer: Self-pay | Admitting: Emergency Medicine

## 2024-02-21 DIAGNOSIS — C9000 Multiple myeloma not having achieved remission: Secondary | ICD-10-CM

## 2024-02-21 MED ORDER — POMALIDOMIDE 3 MG PO CAPS: 3.0000 mg | ORAL_CAPSULE | Freq: Every day | ORAL | 0 refills | Status: DC

## 2024-02-21 NOTE — Telephone Encounter (Signed)
 Spoke with [facility] again, Informed them of previous tele encounter about Dr Donaciano Frizzle msg. Pt and wife verbalized understanding & had no concerns. NFN

## 2024-02-21 NOTE — Telephone Encounter (Signed)
 PET scan reviewed and case discussed with Dr. Maria Shiner.  Patient needs to be seen ASAP to discuss and arrange bronchoscopy.

## 2024-02-21 NOTE — Telephone Encounter (Signed)
 Spoke with Bruce Smith. Pt scheduled for ov 4/24 per RB. Pts wife states that they thought pt was having Bronchoscopy procedure with Dr. Dolan Freiberg from [place] [facility]. They are confused on who is doing bronch and why Dr. Maria Shiner wanted pt to come into office with RB when he is established with Dr. Dolan Freiberg at [facility]. Advised pt & wife I would call them back today. Please advise Dr. Byrum

## 2024-02-22 ENCOUNTER — Encounter: Payer: Self-pay | Admitting: Emergency Medicine

## 2024-02-22 ENCOUNTER — Ambulatory Visit: Admitting: Emergency Medicine

## 2024-02-22 VITALS — BP 136/60 | HR 55 | Ht 68.0 in | Wt 208.8 lb

## 2024-02-22 DIAGNOSIS — F17220 Nicotine dependence, chewing tobacco, uncomplicated: Secondary | ICD-10-CM | POA: Diagnosis not present

## 2024-02-22 DIAGNOSIS — R918 Other nonspecific abnormal finding of lung field: Secondary | ICD-10-CM | POA: Diagnosis not present

## 2024-02-22 NOTE — Patient Instructions (Signed)
 We reviewed your PET scan today. We will work on setting up a navigational bronchoscopy to evaluate a left upper lobe opacity.  This will be done as an outpatient under general anesthesia at [facility] endoscopy.  You will need a designated driver.  You will need someone to watch you that day after the procedure.  Stop your aspirin  2 days prior.  Will try to get this scheduled for either 4/28 or 02/27/2024. We will perform a CT scan of your chest to assist with the bronchoscopy procedure. Follow with our office about 1 week after the bronchoscopy so we can review results.

## 2024-02-22 NOTE — H&P (View-Only) (Signed)
 Subjective:    Patient ID: Bruce Smith, male    DOB: [birth year], 73 y.o.   MRN: [id number]  HPI  Bruce Smith is a 73 year old male with k-light chain myeloma who presents for evaluation of a new left upper lobe mass lesion identified on PET scan.  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 He has undergone systemic therapy and radiation therapy for this condition.  A recent surveillance PET scan on February 01, 2024, revealed a new left upper lobe mass lesion in the AP window measuring 4.1 by 3.2 centimeters with significant hypermetabolism. There was also some associated left upper lobe consolidative change without metabolic activity. Additionally, activity was noted in the abdomen between the spine and the right psoas muscle, as well as a new peritoneal nodule.  His past medical history includes coronary artery disease, hypertension, and hyperlipidemia. He is a former smoker with a history of fifteen pack years.   RADIOLOGY PET scan: New left upper lobe mass lesion in the AP window, 4.1 x 3.2 cm with significant hypermetabolism. Associated left upper lobe consolidative change without metabolic activity. Activity in the abdomen between the spine and the right psoas muscle and a new peritoneal nodule. (02/01/2024)  Review of Systems As per HPI  Past Medical History:  Diagnosis Date   Atherosclerosis    Coronary artery disease    Goals of care, counseling/discussion 06/29/2020   History of radiation therapy    Left Humerus, T-spine, Pelvis- 09/19/22-10/04/22- Dr. Retta Caster   History of radiation therapy    Right Femur- 01/15/23-01/16/23- Dr. Retta Caster   History of radiation therapy    Thoracic spine- 06/06/23-06/19/23- Dr. 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0 ttl pk-yrs)    Types: Cigarettes    Start date: 11/01/1983    Quit date: 10/31/1998    Years since quitting: 25.3   Smokeless tobacco: Current    Types: Chew  Vaping Use   Vaping status: Never Used  Substance and Sexual Activity   Alcohol use: Not Currently    Comment: rare   Drug use: Never   Sexual activity: Not Currently  Other Topics Concern   Not on file  Social History Narrative   Not on file   Social Drivers of Health   Financial Resource Strain: Low Risk  (12/11/2023)   Received from [facility]   Overall Financial Resource Strain (CARDIA)    Difficulty of Paying Living Expenses: Not hard at all  Food Insecurity: No Food Insecurity (12/11/2023)   Received from [facility]   Hunger Vital Sign    Worried About  Running Out of Food in the Last Year: Never true    Ran Out of Food in the Last Year: Never true  Transportation Needs: No Transportation Needs (12/11/2023)   Received from [facility]   PRAPARE - Transportation    Lack of Transportation (Medical): No    Lack of Transportation (Non-Medical): No  Physical Activity: Not on file  Stress: Not on file  Social Connections: Moderately Integrated (11/13/2023)   Social Connection and Isolation Panel [NHANES]    Frequency of Communication with Friends and Family: More than three times a week    Frequency of Social Gatherings with Friends and Family: Twice a week     Attends Religious Services: 1 to 4 times per year    Active Member of [employer] or Organizations: No    Attends Banker Meetings: Never    Marital Status: Married  Catering manager Violence: Not At Risk (11/13/2023)   Humiliation, Afraid, Rape, and Kick questionnaire    Fear of Current or Ex-Partner: No    Emotionally Abused: No    Physically Abused: No    Sexually Abused: No     No Known Allergies   Outpatient Medications Prior to Visit  Medication Sig Dispense Refill   acetaminophen  (TYLENOL ) 500 MG tablet Take 1,000 mg by mouth every 4 (four) hours.     aspirin  EC 81 MG tablet Take 81 mg by mouth at bedtime.     Calcium  Carbonate (CALCIUM  600 PO) Take 600 mg by mouth daily.     Chlorpheniramine Maleate (ALLERGY PO) Take 10 mg by mouth daily.     dexamethasone  (DECADRON ) 4 MG tablet Take 5 tablets (20 mg) for 1 day on the week after chemotherapy (day 22). 5 tablet 11   DULoxetine  (CYMBALTA ) 60 MG capsule Take 1 capsule (60 mg total) by mouth daily. 30 capsule 3   famciclovir  (FAMVIR ) 250 MG tablet TAKE 1 TABLET BY MOUTH EVERY DAY 30 tablet 3   guaiFENesin  (MUCINEX ) 600 MG 12 hr tablet Take 1 tablet (600 mg total) by mouth 2 (two) times daily as needed for to loosen phlegm. 20 tablet 0   levETIRAcetam  (KEPPRA ) 500 MG tablet TAKE 1 TABLET BY MOUTH 2 TIMES A DAY 60 tablet 3   Magnesium  250 MG CAPS Take 250 mg by mouth daily.     metoprolol  succinate (TOPROL -XL) 50 MG 24 hr tablet Take 50 mg by mouth daily.     ondansetron  (ZOFRAN ) 8 MG tablet Take 1 tablet (8 mg total) by mouth every 8 (eight) hours as needed for nausea or vomiting. 30 tablet 1   pantoprazole  (PROTONIX ) 40 MG tablet TAKE 1 TABLET BY MOUTH 2 TIMES A DAY BEFORE MEALS FOR ACID REFLUX 180 tablet 0   polyethylene glycol powder (GLYCOLAX /MIRALAX ) 17 GM/SCOOP powder Take 17 g by mouth daily.     pomalidomide  (POMALYST ) 3 MG capsule Take 1 capsule (3 mg total) by mouth daily. [employer] Auth #  [id number] 21 capsule 0    pregabalin  (LYRICA ) 200 MG capsule TAKE 1 CAPSULE BY MOUTH 2 TIMES DAILY 180 capsule 2   prochlorperazine  (COMPAZINE ) 10 MG tablet Take 1 tablet (10 mg total) by mouth every 6 (six) hours as needed for nausea or vomiting. 30 tablet 1   rosuvastatin  (CRESTOR ) 40 MG tablet TAKE 1 TABLET BY MOUTH EVERY DAY FOR CHOLESTEROL 90 tablet 1   saccharomyces boulardii (FLORASTOR) 250 MG capsule Take 250 mg by mouth daily.     tamsulosin  (FLOMAX ) 0.4 MG CAPS capsule  TAKE 1 CAPSULE BY MOUTH EVERY NIGHT AT BEDTIME 90 capsule 0   traMADol  (ULTRAM ) 50 MG tablet TAKE 2 TABLETS BY MOUTH EVERY 6 HOURS ASNEEDED FOR PAIN 240 tablet 0   benzonatate  (TESSALON ) 200 MG capsule Take 1 capsule (200 mg total) by mouth 3 (three) times daily as needed for cough. (Patient not taking: Reported on 01/31/2024) 20 capsule 0   Ipratropium-Albuterol  (COMBIVENT) 20-100 MCG/ACT AERS respimat Inhale 1 puff into the lungs every 6 (six) hours as needed for wheezing. (Patient not taking: Reported on 01/31/2024) 1 each 0   naloxone  (NARCAN ) nasal spray 4 mg/0.1 mL Administer as directed in each nostril in the event of an overdose. May repeat in 15 minutes. (Patient not taking: Reported on 12/14/2023) 1 each 0   nitroGLYCERIN  (NITROSTAT ) 0.4 MG SL tablet Place 0.4 mg under the tongue every 5 (five) minutes as needed. (Patient not taking: Reported on 02/22/2024)     No facility-administered medications prior to visit.         Objective:   Physical Exam  Vitals:   02/22/24 1612  BP: 136/60  Pulse: (!) 55  SpO2: 98%  Weight: 208 lb 12.8 oz (94.7 kg)  Height: 5\' 8"  (1.727 m)   Gen: Pleasant, well-nourished, in no distress,  normal affect  ENT: No lesions,  mouth clear,  oropharynx clear, no postnasal drip  Neck: No JVD, no stridor  Lungs: No use of accessory muscles, no crackles or wheezing on normal respiration, no wheeze on forced expiration  Cardiovascular: RRR, heart sounds normal, no murmur or gallops, no peripheral  edema  Musculoskeletal: No deformities, no cyanosis or clubbing.  Walking with a cane  Neuro: alert, awake, non focal  Skin: Warm, no lesions or rash     Assessment & Plan:  Mass of upper lobe of left lung Left upper lobe lung mass New left upper lobe mass on PET scan, 4.1 x 3.2 cm, hypermetabolic. Differential: primary lung cancer vs. metastatic disease from multiple myeloma. - Discussed the option of bronchoscopy with him today.  He understands and agrees.  We will plan for robotic assisted navigational bronchoscopy as I believe this lesion is in the left upper lobe parenchyma as opposed to the mediastinum.  I do not think will be reachable by EBUS.  He understands the plan.  Will try to get this scheduled for 03/27/2024    Racheal Buddle, MD, PhD 02/22/2024, 5:06 PM [facility] [phone] or if no answer before 7:00PM call [phone] For any issues after 7:00PM please call eLink [phone]

## 2024-02-22 NOTE — H&P (View-Only) (Signed)
 Subjective:    Patient ID: Bruce Smith, male    DOB: [birth year], 73 y.o.   MRN: [id number]  HPI  Bruce Smith is a 73 year old male with k-light chain myeloma who presents for evaluation of a new left upper lobe mass lesion identified on PET scan.  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 He has undergone systemic therapy and radiation therapy for this condition.  A recent surveillance PET scan on February 01, 2024, revealed a new left upper lobe mass lesion in the AP window measuring 4.1 by 3.2 centimeters with significant hypermetabolism. There was also some associated left upper lobe consolidative change without metabolic activity. Additionally, activity was noted in the abdomen between the spine and the right psoas muscle, as well as a new peritoneal nodule.  His past medical history includes coronary artery disease, hypertension, and hyperlipidemia. He is a former smoker with a history of fifteen pack years.   RADIOLOGY PET scan: New left upper lobe mass lesion in the AP window, 4.1 x 3.2 cm with significant hypermetabolism. Associated left upper lobe consolidative change without metabolic activity. Activity in the abdomen between the spine and the right psoas muscle and a new peritoneal nodule. (02/01/2024)  Review of Systems As per HPI  Past Medical History:  Diagnosis Date   Atherosclerosis    Coronary artery disease    Goals of care, counseling/discussion 06/29/2020   History of radiation therapy    Left Humerus, T-spine, Pelvis- 09/19/22-10/04/22- Dr. Retta Caster   History of radiation therapy    Right Femur- 01/15/23-01/16/23- Dr. Retta Caster   History of radiation therapy    Thoracic spine- 06/06/23-06/19/23- Dr. 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0 ttl pk-yrs)    Types: Cigarettes    Start date: 11/01/1983    Quit date: 10/31/1998    Years since quitting: 25.3   Smokeless tobacco: Current    Types: Chew  Vaping Use   Vaping status: Never Used  Substance and Sexual Activity   Alcohol use: Not Currently    Comment: rare   Drug use: Never   Sexual activity: Not Currently  Other Topics Concern   Not on file  Social History Narrative   Not on file   Social Drivers of Health   Financial Resource Strain: Low Risk  (12/11/2023)   Received from [facility]   Overall Financial Resource Strain (CARDIA)    Difficulty of Paying Living Expenses: Not hard at all  Food Insecurity: No Food Insecurity (12/11/2023)   Received from [facility]   Hunger Vital Sign    Worried About  Running Out of Food in the Last Year: Never true    Ran Out of Food in the Last Year: Never true  Transportation Needs: No Transportation Needs (12/11/2023)   Received from [facility]   PRAPARE - Transportation    Lack of Transportation (Medical): No    Lack of Transportation (Non-Medical): No  Physical Activity: Not on file  Stress: Not on file  Social Connections: Moderately Integrated (11/13/2023)   Social Connection and Isolation Panel [NHANES]    Frequency of Communication with Friends and Family: More than three times a week    Frequency of Social Gatherings with Friends and Family: Twice a week     Attends Religious Services: 1 to 4 times per year    Active Member of [employer] or Organizations: No    Attends Banker Meetings: Never    Marital Status: Married  Catering manager Violence: Not At Risk (11/13/2023)   Humiliation, Afraid, Rape, and Kick questionnaire    Fear of Current or Ex-Partner: No    Emotionally Abused: No    Physically Abused: No    Sexually Abused: No     No Known Allergies   Outpatient Medications Prior to Visit  Medication Sig Dispense Refill   acetaminophen  (TYLENOL ) 500 MG tablet Take 1,000 mg by mouth every 4 (four) hours.     aspirin  EC 81 MG tablet Take 81 mg by mouth at bedtime.     Calcium  Carbonate (CALCIUM  600 PO) Take 600 mg by mouth daily.     Chlorpheniramine Maleate (ALLERGY PO) Take 10 mg by mouth daily.     dexamethasone  (DECADRON ) 4 MG tablet Take 5 tablets (20 mg) for 1 day on the week after chemotherapy (day 22). 5 tablet 11   DULoxetine  (CYMBALTA ) 60 MG capsule Take 1 capsule (60 mg total) by mouth daily. 30 capsule 3   famciclovir  (FAMVIR ) 250 MG tablet TAKE 1 TABLET BY MOUTH EVERY DAY 30 tablet 3   guaiFENesin  (MUCINEX ) 600 MG 12 hr tablet Take 1 tablet (600 mg total) by mouth 2 (two) times daily as needed for to loosen phlegm. 20 tablet 0   levETIRAcetam  (KEPPRA ) 500 MG tablet TAKE 1 TABLET BY MOUTH 2 TIMES A DAY 60 tablet 3   Magnesium  250 MG CAPS Take 250 mg by mouth daily.     metoprolol  succinate (TOPROL -XL) 50 MG 24 hr tablet Take 50 mg by mouth daily.     ondansetron  (ZOFRAN ) 8 MG tablet Take 1 tablet (8 mg total) by mouth every 8 (eight) hours as needed for nausea or vomiting. 30 tablet 1   pantoprazole  (PROTONIX ) 40 MG tablet TAKE 1 TABLET BY MOUTH 2 TIMES A DAY BEFORE MEALS FOR ACID REFLUX 180 tablet 0   polyethylene glycol powder (GLYCOLAX /MIRALAX ) 17 GM/SCOOP powder Take 17 g by mouth daily.     pomalidomide  (POMALYST ) 3 MG capsule Take 1 capsule (3 mg total) by mouth daily. [employer] Auth #  [id number] 21 capsule 0    pregabalin  (LYRICA ) 200 MG capsule TAKE 1 CAPSULE BY MOUTH 2 TIMES DAILY 180 capsule 2   prochlorperazine  (COMPAZINE ) 10 MG tablet Take 1 tablet (10 mg total) by mouth every 6 (six) hours as needed for nausea or vomiting. 30 tablet 1   rosuvastatin  (CRESTOR ) 40 MG tablet TAKE 1 TABLET BY MOUTH EVERY DAY FOR CHOLESTEROL 90 tablet 1   saccharomyces boulardii (FLORASTOR) 250 MG capsule Take 250 mg by mouth daily.     tamsulosin  (FLOMAX ) 0.4 MG CAPS capsule  TAKE 1 CAPSULE BY MOUTH EVERY NIGHT AT BEDTIME 90 capsule 0   traMADol  (ULTRAM ) 50 MG tablet TAKE 2 TABLETS BY MOUTH EVERY 6 HOURS ASNEEDED FOR PAIN 240 tablet 0   benzonatate  (TESSALON ) 200 MG capsule Take 1 capsule (200 mg total) by mouth 3 (three) times daily as needed for cough. (Patient not taking: Reported on 01/31/2024) 20 capsule 0   Ipratropium-Albuterol  (COMBIVENT) 20-100 MCG/ACT AERS respimat Inhale 1 puff into the lungs every 6 (six) hours as needed for wheezing. (Patient not taking: Reported on 01/31/2024) 1 each 0   naloxone  (NARCAN ) nasal spray 4 mg/0.1 mL Administer as directed in each nostril in the event of an overdose. May repeat in 15 minutes. (Patient not taking: Reported on 12/14/2023) 1 each 0   nitroGLYCERIN  (NITROSTAT ) 0.4 MG SL tablet Place 0.4 mg under the tongue every 5 (five) minutes as needed. (Patient not taking: Reported on 02/22/2024)     No facility-administered medications prior to visit.         Objective:   Physical Exam  Vitals:   02/22/24 1612  BP: 136/60  Pulse: (!) 55  SpO2: 98%  Weight: 208 lb 12.8 oz (94.7 kg)  Height: 5\' 8"  (1.727 m)   Gen: Pleasant, well-nourished, in no distress,  normal affect  ENT: No lesions,  mouth clear,  oropharynx clear, no postnasal drip  Neck: No JVD, no stridor  Lungs: No use of accessory muscles, no crackles or wheezing on normal respiration, no wheeze on forced expiration  Cardiovascular: RRR, heart sounds normal, no murmur or gallops, no peripheral  edema  Musculoskeletal: No deformities, no cyanosis or clubbing.  Walking with a cane  Neuro: alert, awake, non focal  Skin: Warm, no lesions or rash     Assessment & Plan:  Mass of upper lobe of left lung Left upper lobe lung mass New left upper lobe mass on PET scan, 4.1 x 3.2 cm, hypermetabolic. Differential: primary lung cancer vs. metastatic disease from multiple myeloma. - Discussed the option of bronchoscopy with him today.  He understands and agrees.  We will plan for robotic assisted navigational bronchoscopy as I believe this lesion is in the left upper lobe parenchyma as opposed to the mediastinum.  I do not think will be reachable by EBUS.  He understands the plan.  Will try to get this scheduled for 03/27/2024    Racheal Buddle, MD, PhD 02/22/2024, 5:06 PM [facility] [phone] or if no answer before 7:00PM call [phone] For any issues after 7:00PM please call eLink [phone]

## 2024-02-22 NOTE — Progress Notes (Signed)
 Subjective:    Patient ID: Bruce Smith, male    DOB: [birth year], 73 y.o.   MRN: [id number]  HPI  Bruce Smith is a 73 year old male with k-light chain myeloma who presents for evaluation of a new left upper lobe mass lesion identified on PET scan.  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 He has undergone systemic therapy and radiation therapy for this condition.  A recent surveillance PET scan on February 01, 2024, revealed a new left upper lobe mass lesion in the AP window measuring 4.1 by 3.2 centimeters with significant hypermetabolism. There was also some associated left upper lobe consolidative change without metabolic activity. Additionally, activity was noted in the abdomen between the spine and the right psoas muscle, as well as a new peritoneal nodule.  His past medical history includes coronary artery disease, hypertension, and hyperlipidemia. He is a former smoker with a history of fifteen pack years.   RADIOLOGY PET scan: New left upper lobe mass lesion in the AP window, 4.1 x 3.2 cm with significant hypermetabolism. Associated left upper lobe consolidative change without metabolic activity. Activity in the abdomen between the spine and the right psoas muscle and a new peritoneal nodule. (02/01/2024)  Review of Systems As per HPI  Past Medical History:  Diagnosis Date   Atherosclerosis    Coronary artery disease    Goals of care, counseling/discussion 06/29/2020   History of radiation therapy    Left Humerus, T-spine, Pelvis- 09/19/22-10/04/22- Dr. Retta Caster   History of radiation therapy    Right Femur- 01/15/23-01/16/23- Dr. Retta Caster   History of radiation therapy    Thoracic spine- 06/06/23-06/19/23- Dr. 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0 ttl pk-yrs)    Types: Cigarettes    Start date: 11/01/1983    Quit date: 10/31/1998    Years since quitting: 25.3   Smokeless tobacco: Current    Types: Chew  Vaping Use   Vaping status: Never Used  Substance and Sexual Activity   Alcohol use: Not Currently    Comment: rare   Drug use: Never   Sexual activity: Not Currently  Other Topics Concern   Not on file  Social History Narrative   Not on file   Social Drivers of Health   Financial Resource Strain: Low Risk  (12/11/2023)   Received from [facility]   Overall Financial Resource Strain (CARDIA)    Difficulty of Paying Living Expenses: Not hard at all  Food Insecurity: No Food Insecurity (12/11/2023)   [facility]   Hunger Vital Sign    Worried About  Running Out of Food in the Last Year: Never true    Ran Out of Food in the Last Year: Never true  Transportation Needs: No Transportation Needs (12/11/2023)   Received from [facility]   PRAPARE - Transportation    Lack of Transportation (Medical): No    Lack of Transportation (Non-Medical): No  Physical Activity: Not on file  Stress: Not on file  Social Connections: Moderately Integrated (11/13/2023)   Social Connection and Isolation Panel [NHANES]    Frequency of Communication with Friends and Family: More than three times a week    Frequency of Social Gatherings with Friends and Family: Twice a week     Attends Religious Services: 1 to 4 times per year    Active Member of [employer] or Organizations: No    Attends Banker Meetings: Never    Marital Status: Married  Catering manager Violence: Not At Risk (11/13/2023)   Humiliation, Afraid, Rape, and Kick questionnaire    Fear of Current or Ex-Partner: No    Emotionally Abused: No    Physically Abused: No    Sexually Abused: No     No Known Allergies   Outpatient Medications Prior to Visit  Medication Sig Dispense Refill   acetaminophen  (TYLENOL ) 500 MG tablet Take 1,000 mg by mouth every 4 (four) hours.     aspirin  EC 81 MG tablet Take 81 mg by mouth at bedtime.     Calcium  Carbonate (CALCIUM  600 PO) Take 600 mg by mouth daily.     Chlorpheniramine Maleate (ALLERGY PO) Take 10 mg by mouth daily.     dexamethasone  (DECADRON ) 4 MG tablet Take 5 tablets (20 mg) for 1 day on the week after chemotherapy (day 22). 5 tablet 11   DULoxetine  (CYMBALTA ) 60 MG capsule Take 1 capsule (60 mg total) by mouth daily. 30 capsule 3   famciclovir  (FAMVIR ) 250 MG tablet TAKE 1 TABLET BY MOUTH EVERY DAY 30 tablet 3   guaiFENesin  (MUCINEX ) 600 MG 12 hr tablet Take 1 tablet (600 mg total) by mouth 2 (two) times daily as needed for to loosen phlegm. 20 tablet 0   levETIRAcetam  (KEPPRA ) 500 MG tablet TAKE 1 TABLET BY MOUTH 2 TIMES A DAY 60 tablet 3   Magnesium  250 MG CAPS Take 250 mg by mouth daily.     metoprolol  succinate (TOPROL -XL) 50 MG 24 hr tablet Take 50 mg by mouth daily.     ondansetron  (ZOFRAN ) 8 MG tablet Take 1 tablet (8 mg total) by mouth every 8 (eight) hours as needed for nausea or vomiting. 30 tablet 1   pantoprazole  (PROTONIX ) 40 MG tablet TAKE 1 TABLET BY MOUTH 2 TIMES A DAY BEFORE MEALS FOR ACID REFLUX 180 tablet 0   polyethylene glycol powder (GLYCOLAX /MIRALAX ) 17 GM/SCOOP powder Take 17 g by mouth daily.     pomalidomide  (POMALYST ) 3 MG capsule Take 1 capsule (3 mg total) by mouth daily. [employer] Auth #  [id number] 21 capsule 0    pregabalin  (LYRICA ) 200 MG capsule TAKE 1 CAPSULE BY MOUTH 2 TIMES DAILY 180 capsule 2   prochlorperazine  (COMPAZINE ) 10 MG tablet Take 1 tablet (10 mg total) by mouth every 6 (six) hours as needed for nausea or vomiting. 30 tablet 1   rosuvastatin  (CRESTOR ) 40 MG tablet TAKE 1 TABLET BY MOUTH EVERY DAY FOR CHOLESTEROL 90 tablet 1   saccharomyces boulardii (FLORASTOR) 250 MG capsule Take 250 mg by mouth daily.     tamsulosin  (FLOMAX ) 0.4 MG CAPS capsule  TAKE 1 CAPSULE BY MOUTH EVERY NIGHT AT BEDTIME 90 capsule 0   traMADol  (ULTRAM ) 50 MG tablet TAKE 2 TABLETS BY MOUTH EVERY 6 HOURS ASNEEDED FOR PAIN 240 tablet 0   benzonatate  (TESSALON ) 200 MG capsule Take 1 capsule (200 mg total) by mouth 3 (three) times daily as needed for cough. (Patient not taking: Reported on 01/31/2024) 20 capsule 0   Ipratropium-Albuterol  (COMBIVENT) 20-100 MCG/ACT AERS respimat Inhale 1 puff into the lungs every 6 (six) hours as needed for wheezing. (Patient not taking: Reported on 01/31/2024) 1 each 0   naloxone  (NARCAN ) nasal spray 4 mg/0.1 mL Administer as directed in each nostril in the event of an overdose. May repeat in 15 minutes. (Patient not taking: Reported on 12/14/2023) 1 each 0   nitroGLYCERIN  (NITROSTAT ) 0.4 MG SL tablet Place 0.4 mg under the tongue every 5 (five) minutes as needed. (Patient not taking: Reported on 02/22/2024)     No facility-administered medications prior to visit.         Objective:   Physical Exam  Vitals:   02/22/24 1612  BP: 136/60  Pulse: (!) 55  SpO2: 98%  Weight: 208 lb 12.8 oz (94.7 kg)  Height: 5\' 8"  (1.727 m)   Gen: Pleasant, well-nourished, in no distress,  normal affect  ENT: No lesions,  mouth clear,  oropharynx clear, no postnasal drip  Neck: No JVD, no stridor  Lungs: No use of accessory muscles, no crackles or wheezing on normal respiration, no wheeze on forced expiration  Cardiovascular: RRR, heart sounds normal, no murmur or gallops, no peripheral  edema  Musculoskeletal: No deformities, no cyanosis or clubbing.  Walking with a cane  Neuro: alert, awake, non focal  Skin: Warm, no lesions or rash     Assessment & Plan:  Mass of upper lobe of left lung Left upper lobe lung mass New left upper lobe mass on PET scan, 4.1 x 3.2 cm, hypermetabolic. Differential: primary lung cancer vs. metastatic disease from multiple myeloma. - Discussed the option of bronchoscopy with him today.  He understands and agrees.  We will plan for robotic assisted navigational bronchoscopy as I believe this lesion is in the left upper lobe parenchyma as opposed to the mediastinum.  I do not think will be reachable by EBUS.  He understands the plan.  Will try to get this scheduled for 03/27/2024    Racheal Buddle, MD, PhD 02/22/2024, 5:06 PM [facility] [phone] or if no answer before 7:00PM call [phone] For any issues after 7:00PM please call eLink [phone]

## 2024-02-22 NOTE — Assessment & Plan Note (Signed)
 Left upper lobe lung mass New left upper lobe mass on PET scan, 4.1 x 3.2 cm, hypermetabolic. Differential: primary lung cancer vs. metastatic disease from multiple myeloma. - Discussed the option of bronchoscopy with him today.  He understands and agrees.  We will plan for robotic assisted navigational bronchoscopy as I believe this lesion is in the left upper lobe parenchyma as opposed to the mediastinum.  I do not think will be reachable by EBUS.  He understands the plan.  Will try to get this scheduled for 03/27/2024

## 2024-02-23 ENCOUNTER — Ambulatory Visit (HOSPITAL_COMMUNITY)
Admission: RE | Admit: 2024-02-23 | Discharge: 2024-02-23 | Disposition: A | Discharge status: Home / Self Care | Source: Ambulatory Visit | Attending: Emergency Medicine | Admitting: Emergency Medicine

## 2024-02-23 ENCOUNTER — Other Ambulatory Visit: Payer: Self-pay

## 2024-02-23 ENCOUNTER — Encounter (HOSPITAL_COMMUNITY): Payer: Self-pay | Admitting: Emergency Medicine

## 2024-02-23 ENCOUNTER — Encounter: Payer: Self-pay | Admitting: *Deleted

## 2024-02-23 ENCOUNTER — Telehealth: Payer: Self-pay | Admitting: *Deleted

## 2024-02-23 DIAGNOSIS — R918 Other nonspecific abnormal finding of lung field: Secondary | ICD-10-CM | POA: Diagnosis present

## 2024-02-23 NOTE — Progress Notes (Addendum)
 PCP - Sheilah Denver, MD Cardiologist - Martyn Sleigh, MD Oncologist - Gray Layman, MD  Chest x-ray - 11/15/23 EKG - 11/12/23 ECHO - 03/13/23 Cardiac Cath - 03/09/23  Aspirin  Instructions: "Stop aspirin  2 days prior"  Anesthesia review: Y  Patient verbally denies any shortness of breath, fever, cough and chest pain during phone call   -------------  SDW INSTRUCTIONS given:  Your procedure is scheduled on Monday, April 28th.  Report to [facility] Main Entrance "A" at 0530 A.M., and check in at the Admitting office.  Call this number if you have problems the morning of surgery:  [phone]   Remember:  Do not eat or drink after midnight the night before your surgery   Take these medicines the morning of surgery with A SIP OF WATER   acetaminophen  (TYLENOL )  Claritin DULoxetine  (CYMBALTA ) famciclovir  (FAMVIR )   guaiFENesin  (MUCINEX )  levETIRAcetam  (KEPPRA )  metoprolol  succinate (TOPROL -XL)  pantoprazole  (PROTONIX )  MIRALAX   pregabalin  (LYRICA )  saccharomyces boulardii (FLORASTOR)  tamsulosin  (FLOMAX )  ondansetron  (ZOFRAN )-if needed prochlorperazine  (COMPAZINE )-if needed traMADol  (ULTRAM )-if needed  As of today, STOP taking any Aspirin  (unless otherwise instructed by your surgeon) Aleve, Naproxen, Ibuprofen , Motrin, Advil, Goody's, BC's, all herbal medications, fish oil, and all vitamins.                      Do not wear jewelry, make up, or nail polish            Do not wear lotions, powders, perfumes/colognes, or deodorant.            Do not shave 48 hours prior to surgery.  Men may shave face and neck.            Do not bring valuables to the hospital.            [facility] is not responsible for any belongings or valuables.  Do NOT Smoke (Tobacco/Vaping) 24 hours prior to your procedure If you use a CPAP at night, you may bring all equipment for your overnight stay.   Contacts, glasses, dentures or bridgework may not be worn into surgery.      For patients  admitted to the hospital, discharge time will be determined by your treatment team.   Patients discharged the day of surgery will not be allowed to drive home, and someone needs to stay with them for 24 hours.    Special instructions:   [facility]- Preparing For Surgery  Before surgery, you can play an important role. Because skin is not sterile, your skin needs to be as free of germs as possible. You can reduce the number of germs on your skin by washing with CHG (chlorahexidine gluconate) Soap before surgery.  CHG is an antiseptic cleaner which kills germs and bonds with the skin to continue killing germs even after washing.    Oral Hygiene is also important to reduce your risk of infection.  Remember - BRUSH YOUR TEETH THE MORNING OF SURGERY WITH YOUR REGULAR TOOTHPASTE  Please do not use if you have an allergy to CHG or antibacterial soaps. If your skin becomes reddened/irritated stop using the CHG.  Do not shave (including legs and underarms) for at least 48 hours prior to first CHG shower. It is OK to shave your face.  Please follow these instructions carefully.   Shower the NIGHT BEFORE SURGERY and the MORNING OF SURGERY with DIAL Soap.   Pat yourself dry with a CLEAN TOWEL.  Wear CLEAN PAJAMAS to bed  the night before surgery  Place CLEAN SHEETS on your bed the night of your first shower and DO NOT SLEEP WITH PETS.   Day of Surgery: Please shower morning of surgery  Wear Clean/Comfortable clothing the morning of surgery Do not apply any deodorants/lotions.   Remember to brush your teeth WITH YOUR REGULAR TOOTHPASTE.   Questions were answered. Patient verbalized understanding of instructions.

## 2024-02-23 NOTE — Anesthesia Preprocedure Evaluation (Addendum)
 Anesthesia Evaluation  Patient identified by MRN, date of birth, ID band Patient awake    Reviewed: Allergy & Precautions, H&P , NPO status , Patient's Chart, lab work & pertinent test results  Airway Mallampati: IV  TM Distance: >3 FB Neck ROM: Limited    Dental  (+) Edentulous Upper, Edentulous Lower   Pulmonary former smoker Recent imaging with new LUL lung mass, hypermetabolic on PET scan. Differential included primary lung cancer and metastatic multiple myeloma.  Quit smoking 2000, 15 pack year history    Pulmonary exam normal breath sounds clear to auscultation       Cardiovascular hypertension (116/54 preop), Pt. 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  Ejection fraction is normal.  2.  The aortic, mitral, and tricuspid valves have grossly normal  appearance and function.  Trace mitral and tricuspid insufficiency is  noted  3. The atria are normal in size bilaterally  4.  Right ventricular structure and function is normal  5.  The pericardium is normal in thickness there is no effusion     Neuro/Psych  negative psych ROS   GI/Hepatic Neg liver ROS,GERD  Controlled,,  Endo/Other  negative endocrine ROS    Renal/GU negative Renal ROS  negative genitourinary   Musculoskeletal  (+) Arthritis , Osteoarthritis,    Abdominal  (+) + obese  Peds negative pediatric ROS (+)  Hematology  (+) Blood dyscrasia Multiple myeloma- (pathologic left hip fracture, s/p nailing 07/14/20; XRT to multiple sites, autologous stem cell support 01/22/21; more recently Kyprollis/Pomalyst , IVIG, being evaluated for lympho-depletion chemotherapy followed by carT infusion but on hold  until lung mass evaluated)   Anesthesia Other Findings   Reproductive/Obstetrics negative OB ROS                             Anesthesia Physical Anesthesia Plan  ASA: 3  Anesthesia Plan: General   Post-op Pain Management: Tylenol  PO (pre-op)*   Induction: Intravenous  PONV Risk Score and Plan: 2 and Ondansetron , Dexamethasone  and Treatment may vary due to age or medical condition  Airway Management Planned: Oral ETT and Video Laryngoscope Planned  Additional Equipment: None  Intra-op Plan:   Post-operative Plan: Extubation in OR  Informed Consent: I have reviewed the patients History and Physical, chart, labs and discussed the procedure including the risks, benefits and alternatives for the proposed anesthesia with the patient or authorized representative who has indicated his/her understanding and acceptance.     Dental advisory given  Plan Discussed with: CRNA  Anesthesia Plan Comments: ( )       Anesthesia Quick Evaluation

## 2024-02-23 NOTE — Progress Notes (Signed)
 Anesthesia Chart Review: SAME DAY WORK-UP  Case: [id number] Date/Time: 02/26/24 0730   Procedure: BRONCHOSCOPY, WITH BIOPSY USING ELECTROMAGNETIC NAVIGATION (Left)   Anesthesia type: General   Diagnosis: Lung mass [R91.8]   Pre-op diagnosis: Left hilar mass   [facility]   Surgeons: Denson Flake, MD       DISCUSSION: Patient is a 73 year old male scheduled for the above procedure. He has known multiple myeloma. Recent imaging with new LUL lung mass, hypermetabolic on PET scan. Differential included primary lung cancer and metastatic multiple myeloma. Above procedure recommended.   History includes former smoker (quit 2000), HTN, HLD, multiple myeloma (pathologic left hip fracture, s/p nailing 07/14/20; XRT to multiple sites, autologous stem cell support 01/22/21; more recently Kyprollis/Pomalyst , IVIG, being evaluated for lympho-depletion chemotherapy followed by carT infusion but on hold until lung mass evaluated), CAD (MI s/p RCA & LAD stents 2013; DES PDA 05/09/16; NSTEMI s/p CABG: LIMA-LAD, SVG-OM1, SVG-OM2 03/13/23), GERD, spinal surgery (C5-6 ACDF 06/17/20).    Last visit with cardiologist Dr. Zackary Heron was on 12/11/23. Six month follow-up planned.   Reportedly, instructions were for him to hold ASA for 2 days prior to procedure.  Anesthesia team to read on the day of surgery.  VS:  Wt Readings from Last 3 Encounters:  02/22/24 94.7 kg  02/15/24 93.4 kg  01/31/24 93 kg   BP Readings from Last 3 Encounters:  02/22/24 136/60  02/15/24 (!) 120/57  02/02/24 133/65   Pulse Readings from Last 3 Encounters:  02/22/24 (!) 55  02/15/24 (!) 54  02/02/24 (!) 50    PROVIDERS: Sheilah Denver., MD is PCP  Martyn Sleigh, MD is cardiologist Gray Layman, MD is [facility]). And Long, Konnie Perry, MD ([facility]) Racheal Buddle, MD is pulmonologist   LABS: Most recent lab results in [facility] include: Lab Results  Component  Value Date   WBC 3.7 (L) 02/15/2024   HGB 11.8 (L) 02/15/2024   HCT 35.5 (L) 02/15/2024   PLT 127 (L) 02/15/2024   GLUCOSE 113 (H) 02/15/2024   CHOL 173 03/09/2023   TRIG 168 (H) 03/09/2023   HDL 41 03/09/2023   LDLCALC 98 03/09/2023   ALT 23 02/15/2024   AST 15 02/15/2024   NA 140 02/15/2024   K 3.9 02/15/2024   CL 105 02/15/2024   CREATININE 1.24 02/15/2024   BUN 26 (H) 02/15/2024   CO2 27 02/15/2024   TSH 1.182 11/13/2023   INR 1.3 (H) 11/13/2023   HGBA1C 5.5 03/09/2023      PFTs 03/10/23:  Latest Reference Range & Units 03/10/23 09:13  FVC-Pre L 3.71  FVC-%Pred-Pre % 91  FEV1-Pre L 2.44  FEV1-%Pred-Pre % 82  Pre FEV1/FVC ratio % 66  FEV1FVC-%Pred-Pre % 89  FEF 25-75 Pre L/sec 1.23  FEF2575-%Pred-Pre % 54  FEV6-Pre L 3.56  FEV6-%Pred-Pre % 93  Pre FEV6/FVC Ratio % 96  FEV6FVC-%Pred-Pre % 102      IMAGES: PET Scan 02/01/24: IMPRESSION: 1. Clear evidence of progression of multifocal hypermetabolic malignancy. 2. Marked interval increase in size of intense hypermetabolic prevascular/AP window nodal mass. 3. Interval increase in size and metabolic activity bilateral pulmonary nodules. 4. New small ventral peritoneal implant in the lower abdomen with metabolic activity. 5. Increase in metabolic activity of metastatic lesion at L5. 6. Increase intense metabolic activity along the shaft of the LEFT femoral prosthetic.   MRI Head 10/15/23 ([facility]): IMPRESSION: No evidence  of acute intracranial abnormality.     EKG: 11/12/23: Sinus rhythm Multiple ventricular premature complexes Confirmed by Early Glisson ([id number]) on 11/14/2023 8:11:44 AM   CV: Mobile Telemetry 10/27/23 ([facility]): IMPRESSION: 1.  Normal sinus rhythm predominates recording  2.  Sinus tachycardia is noted during daytime activity, sinus bradycardia is noted during evening hours  3.  PACs and short PAC's salvos are noted.  Some brief episodes of nonsustained atrial tachycardia are seen   4.  Occasional PVCs are noted, AIVR occurs occasionally during sleep  5.  No pauses or A-fib are noted    Echo 10/16/23 ([facility]): Technically difficult but adequate 2D M-mode and color-flow Doppler  echocardiogram demonstrates grossly normal left ventricular chamber size  and wall motion.  Ejection fraction is normal.  2.  The aortic, mitral, and tricuspid valves have grossly normal  appearance and function.  Trace mitral and tricuspid insufficiency is  noted  3. The atria are normal in size bilaterally  4.  Right ventricular structure and function is normal  5.  The pericardium is normal in thickness there is no effusion    US  Carotid 03/11/23: Summary:  - Right Carotid: Velocities in the right ICA are consistent with a 1-39%  stenosis.  - Left Carotid: Velocities in the left ICA are consistent with a 1-39%  stenosis.  - Vertebrals: Bilateral vertebral arteries demonstrate antegrade flow.    Last [facility] 03/09/23 prior to CABG.   Past Medical History:  Diagnosis Date   Atherosclerosis    Coronary artery disease    GERD (gastroesophageal reflux disease)    Goals of care, counseling/discussion 06/29/2020   History of autologous stem cell transplant (HCC)    At [facility] 2022   History of pneumonia    History of radiation therapy    Left Humerus, T-spine, Pelvis- 09/19/22-10/04/22- Dr. Retta Caster   History of radiation therapy    Right Femur- 01/15/23-01/16/23- Dr. Retta Caster   History of radiation therapy    Thoracic spine- 06/06/23-06/19/23- Dr. 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   Multiple myeloma (HCC) 07/21/2020   Myocardial infarction [facility])     Past Surgical History:  Procedure Laterality Date   ANTERIOR CERVICAL DECOMP/DISCECTOMY FUSION N/A 06/17/2020   Procedure: ANTERIOR CERVICAL  DECOMPRESSION/DISCECTOMY FUSION, INTERBODY PROSTHESIS, PLATE/SCREWS CERVICAL FIVE- CERVICAL SIX;  Surgeon: Garry Kansas, MD;  Location: [facility] OR;  Service: Neurosurgery;  Laterality: N/A;  ANTERIOR CERVICAL DECOMPRESSION/DISCECTOMY FUSION, INTERBODY PROSTHESIS, PLATE/SCREWS CERVICAL FIVE- CERVICAL SIX   BIOPSY  02/24/2021   Procedure: BIOPSY;  Surgeon: Felecia Hopper, MD;  Location: [facility] ENDOSCOPY;  Service: Gastroenterology;;   CARDIAC CATHETERIZATION     CORONARY ARTERY BYPASS GRAFT N/A 03/13/2023   Procedure: CORONARY ARTERY BYPASS GRAFTING (CABG) X3, USING RIGHT LEG GREATER SAPHENOUS VEIN HARVESTED ENDOSCOPICALLY & LEFT IMA;  Surgeon: Bartley Lightning, MD;  Location: [facility] OR;  Service: Open Heart Surgery;  Laterality: N/A;   ESOPHAGOGASTRODUODENOSCOPY N/A 02/24/2021   Procedure: ESOPHAGOGASTRODUODENOSCOPY (EGD);  Surgeon: Felecia Hopper, MD;  Location: [facility];  Service: Gastroenterology;  Laterality: N/A;   FEMUR IM NAIL Left 07/14/2020   Procedure: OPEN REDUCTION INTERNAL FIXATION (ORIF LEFT FEMORAL NAIL FRACTURE;  Surgeon: Osa Blase, MD;  Location: [facility];  Service: Orthopedics;  Laterality: Left;   LEFT HEART CATH AND CORONARY ANGIOGRAPHY N/A 03/09/2023   Procedure: LEFT HEART CATH AND CORONARY  ANGIOGRAPHY;  Surgeon: Avanell Leigh, MD;  Location: [facility] INVASIVE CV LAB;  Service: Cardiovascular;  Laterality: N/A;   TEE WITHOUT CARDIOVERSION N/A 03/13/2023   Procedure: TRANSESOPHAGEAL ECHOCARDIOGRAM;  Surgeon: Bartley Lightning, MD;  Location: [facility] OR;  Service: Open Heart Surgery;  Laterality: N/A;    MEDICATIONS: No current facility-administered medications for this encounter.    acetaminophen  (TYLENOL ) 500 MG tablet   aspirin  EC 81 MG tablet   benzonatate  (TESSALON ) 200 MG capsule   Calcium  Carbonate (CALCIUM  600 PO)   Chlorpheniramine Maleate (ALLERGY PO)   dexamethasone  (DECADRON ) 4 MG tablet   DULoxetine  (CYMBALTA ) 60 MG capsule   famciclovir  (FAMVIR ) 250 MG tablet    guaiFENesin  (MUCINEX ) 600 MG 12 hr tablet   Ipratropium-Albuterol  (COMBIVENT) 20-100 MCG/ACT AERS respimat   levETIRAcetam  (KEPPRA ) 500 MG tablet   Magnesium  250 MG CAPS   metoprolol  succinate (TOPROL -XL) 50 MG 24 hr tablet   naloxone  (NARCAN ) nasal spray 4 mg/0.1 mL   nitroGLYCERIN  (NITROSTAT ) 0.4 MG SL tablet   ondansetron  (ZOFRAN ) 8 MG tablet   pantoprazole  (PROTONIX ) 40 MG tablet   polyethylene glycol powder (GLYCOLAX /MIRALAX ) 17 GM/SCOOP powder   pomalidomide  (POMALYST ) 3 MG capsule   pregabalin  (LYRICA ) 200 MG capsule   prochlorperazine  (COMPAZINE ) 10 MG tablet   rosuvastatin  (CRESTOR ) 40 MG tablet   saccharomyces boulardii (FLORASTOR) 250 MG capsule   tamsulosin  (FLOMAX ) 0.4 MG CAPS capsule   traMADol  (ULTRAM ) 50 MG tablet    Ella Gun, PA-C Surgical Short Stay/Anesthesiology [facility] Phone [phone] [facility] Phone [phone] 02/23/2024 12:04 PM

## 2024-02-23 NOTE — Telephone Encounter (Signed)
 Call received from [employer] requesting instructions for Pomalyst  dosing.  [employer] notified per order of Dr. Maria Shiner to hold Pomalyst  at this time.

## 2024-02-26 ENCOUNTER — Ambulatory Visit (HOSPITAL_BASED_OUTPATIENT_CLINIC_OR_DEPARTMENT_OTHER): Payer: Self-pay | Admitting: Vascular Surgery

## 2024-02-26 ENCOUNTER — Encounter (HOSPITAL_COMMUNITY)
Admission: RE | Disposition: A | Discharge status: Home / Self Care | Payer: Self-pay | Source: Home / Self Care | Attending: Emergency Medicine

## 2024-02-26 ENCOUNTER — Ambulatory Visit (HOSPITAL_COMMUNITY)

## 2024-02-26 ENCOUNTER — Ambulatory Visit (HOSPITAL_COMMUNITY): Payer: Self-pay | Admitting: Vascular Surgery

## 2024-02-26 ENCOUNTER — Encounter (HOSPITAL_COMMUNITY): Payer: Self-pay | Admitting: Emergency Medicine

## 2024-02-26 ENCOUNTER — Ambulatory Visit (HOSPITAL_COMMUNITY)
Admission: RE | Admit: 2024-02-26 | Discharge: 2024-02-26 | Disposition: A | Discharge status: Home / Self Care | Attending: Emergency Medicine | Admitting: Emergency Medicine

## 2024-02-26 DIAGNOSIS — Z8579 Personal history of other malignant neoplasms of lymphoid, hematopoietic and related tissues: Secondary | ICD-10-CM | POA: Diagnosis not present

## 2024-02-26 DIAGNOSIS — Z87891 Personal history of nicotine dependence: Secondary | ICD-10-CM | POA: Diagnosis not present

## 2024-02-26 DIAGNOSIS — I10 Essential (primary) hypertension: Secondary | ICD-10-CM

## 2024-02-26 DIAGNOSIS — Z951 Presence of aortocoronary bypass graft: Secondary | ICD-10-CM | POA: Diagnosis not present

## 2024-02-26 DIAGNOSIS — R846 Abnormal cytological findings in specimens from respiratory organs and thorax: Secondary | ICD-10-CM | POA: Insufficient documentation

## 2024-02-26 DIAGNOSIS — R918 Other nonspecific abnormal finding of lung field: Secondary | ICD-10-CM | POA: Diagnosis present

## 2024-02-26 DIAGNOSIS — E785 Hyperlipidemia, unspecified: Secondary | ICD-10-CM | POA: Diagnosis not present

## 2024-02-26 DIAGNOSIS — I2510 Atherosclerotic heart disease of native coronary artery without angina pectoris: Secondary | ICD-10-CM | POA: Insufficient documentation

## 2024-02-26 DIAGNOSIS — I252 Old myocardial infarction: Secondary | ICD-10-CM | POA: Diagnosis not present

## 2024-02-26 DIAGNOSIS — M1990 Unspecified osteoarthritis, unspecified site: Secondary | ICD-10-CM | POA: Diagnosis not present

## 2024-02-26 HISTORY — PX: VIDEO BRONCHOSCOPY WITH RADIAL ENDOBRONCHIAL ULTRASOUND: SHX6849

## 2024-02-26 HISTORY — DX: Gastro-esophageal reflux disease without esophagitis: K21.9

## 2024-02-26 HISTORY — PX: BRONCHIAL BIOPSY: SHX5109

## 2024-02-26 HISTORY — PX: BRONCHIAL NEEDLE ASPIRATION BIOPSY: SHX5106

## 2024-02-26 HISTORY — DX: Stem cells transplant status: Z94.84

## 2024-02-26 HISTORY — DX: Personal history of pneumonia (recurrent): Z87.01

## 2024-02-26 HISTORY — PX: BRONCHIAL BRUSHINGS: SHX5108

## 2024-02-26 HISTORY — PX: VIDEO BRONCHOSCOPY WITH ENDOBRONCHIAL NAVIGATION: SHX6175

## 2024-02-26 SURGERY — VIDEO BRONCHOSCOPY WITH ENDOBRONCHIAL NAVIGATION
Anesthesia: General | Laterality: Left

## 2024-02-26 MED ORDER — CHLORHEXIDINE GLUCONATE 0.12 % MT SOLN: 15.0000 mL | Freq: Once | OROMUCOSAL | Status: AC

## 2024-02-26 MED ORDER — ACETAMINOPHEN 500 MG PO TABS
1000.0000 mg | ORAL_TABLET | Freq: Once | ORAL | Status: DC
  Filled 2024-02-26: qty 2

## 2024-02-26 MED ORDER — ROCURONIUM BROMIDE 10 MG/ML (PF) SYRINGE
PREFILLED_SYRINGE | INTRAVENOUS | Status: DC | PRN
  Administered 2024-02-26: 20 mg via INTRAVENOUS
  Administered 2024-02-26: 70 mg via INTRAVENOUS

## 2024-02-26 MED ORDER — PROPOFOL 10 MG/ML IV BOLUS
INTRAVENOUS | Status: DC | PRN
  Administered 2024-02-26: 150 mg via INTRAVENOUS

## 2024-02-26 MED ORDER — PHENYLEPHRINE HCL-NACL 20-0.9 MG/250ML-% IV SOLN
INTRAVENOUS | Status: DC | PRN
  Administered 2024-02-26: 15 ug/min via INTRAVENOUS

## 2024-02-26 MED ORDER — DEXAMETHASONE SODIUM PHOSPHATE 10 MG/ML IJ SOLN
INTRAMUSCULAR | Status: DC | PRN
  Administered 2024-02-26: 10 mg via INTRAVENOUS

## 2024-02-26 MED ORDER — AMISULPRIDE (ANTIEMETIC) 5 MG/2ML IV SOLN: 10.0000 mg | Freq: Once | INTRAVENOUS | Status: DC | PRN

## 2024-02-26 MED ORDER — FENTANYL CITRATE (PF) 100 MCG/2ML IJ SOLN: 25.0000 ug | INTRAMUSCULAR | Status: DC | PRN

## 2024-02-26 MED ORDER — LACTATED RINGERS IV SOLN: INTRAVENOUS | Status: DC

## 2024-02-26 MED ORDER — OXYCODONE HCL 5 MG PO TABS: 5.0000 mg | ORAL_TABLET | Freq: Once | ORAL | Status: DC | PRN

## 2024-02-26 MED ORDER — OXYCODONE HCL 5 MG/5ML PO SOLN: 5.0000 mg | Freq: Once | ORAL | Status: DC | PRN

## 2024-02-26 MED ORDER — CHLORHEXIDINE GLUCONATE 0.12 % MT SOLN
OROMUCOSAL | Status: AC
  Administered 2024-02-26: 15 mL via OROMUCOSAL
  Filled 2024-02-26: qty 15

## 2024-02-26 MED ORDER — LIDOCAINE 2% (20 MG/ML) 5 ML SYRINGE
INTRAMUSCULAR | Status: DC | PRN
Start: 2024-02-26 — End: 2024-02-26
  Administered 2024-02-26: 60 mg via INTRAVENOUS

## 2024-02-26 MED ORDER — ONDANSETRON HCL 4 MG/2ML IJ SOLN
INTRAMUSCULAR | Status: DC | PRN
  Administered 2024-02-26: 4 mg via INTRAVENOUS

## 2024-02-26 MED ORDER — ACETAMINOPHEN 500 MG PO TABS: 1000.0000 mg | ORAL_TABLET | Freq: Four times a day (QID) | ORAL | Status: DC

## 2024-02-26 MED ORDER — ONDANSETRON HCL 4 MG/2ML IJ SOLN: 4.0000 mg | Freq: Once | INTRAMUSCULAR | Status: DC | PRN

## 2024-02-26 MED ORDER — LACTATED RINGERS IV SOLN: INTRAVENOUS | Status: DC | PRN

## 2024-02-26 MED ORDER — SUGAMMADEX SODIUM 200 MG/2ML IV SOLN
INTRAVENOUS | Status: DC | PRN
  Administered 2024-02-26: 200 mg via INTRAVENOUS

## 2024-02-26 MED ORDER — PROPOFOL 500 MG/50ML IV EMUL
INTRAVENOUS | Status: DC | PRN
  Administered 2024-02-26: 150 ug/kg/min via INTRAVENOUS

## 2024-02-26 MED ORDER — EPHEDRINE SULFATE-NACL 50-0.9 MG/10ML-% IV SOSY
PREFILLED_SYRINGE | INTRAVENOUS | Status: DC | PRN
  Administered 2024-02-26 (×2): 5 mg via INTRAVENOUS

## 2024-02-26 NOTE — Transfer of Care (Signed)
 Immediate Anesthesia Transfer of Care Note  Patient: Bruce Smith  Procedure(s) Performed: VIDEO BRONCHOSCOPY WITH ENDOBRONCHIAL NAVIGATION (Left) BRONCHOSCOPY, WITH BRUSH BIOPSY BRONCHOSCOPY, WITH NEEDLE ASPIRATION BIOPSY VIDEO BRONCHOSCOPY WITH RADIAL ENDOBRONCHIAL ULTRASOUND BRONCHOSCOPY, WITH BIOPSY  Patient Location: PACU  Anesthesia Type:General  Level of Consciousness: awake, alert , and oriented  Airway & Oxygen Therapy: Patient Spontanous Breathing  Post-op Assessment: Report given to RN and Post -op Vital signs reviewed and stable  Post vital signs: Reviewed and stable  Last Vitals:  Vitals Value Taken Time  BP 119/48 02/26/24 0940  Temp 36.9 C 02/26/24 0940  Pulse 61 02/26/24 0941  Resp 14 02/26/24 0941  SpO2 94 % 02/26/24 0941  Vitals shown include unfiled device data.  Last Pain:  Vitals:   02/26/24 0629  PainSc: 5       Patients Stated Pain Goal: 3 (02/26/24 0629)  Complications: No notable events documented.

## 2024-02-26 NOTE — Op Note (Signed)
 Video Bronchoscopy with Robotic Assisted Bronchoscopic Navigation   Date of Operation: 02/26/2024   Pre-op Diagnosis: Pulmonary nodules, left hilar mass  Post-op Diagnosis: Same  Surgeon: Racheal Buddle  Assistants: None  Anesthesia: General endotracheal anesthesia  Operation: Flexible video fiberoptic bronchoscopy with robotic assistance and biopsies.  Estimated Blood Loss: Minimal  Complications: None  Indications and History: Bruce Smith is a 73 y.o. male with history of multiple myeloma, remote tobacco use.  His surveillance PET scan showed a new left hilar mass adjacent to the AP window as well as some scattered rounded pulmonary nodules.  Recommendation made to achieve a tissue diagnosis via robotic assisted navigational bronchoscopy.  The risks, benefits, complications, treatment options and expected outcomes were discussed with the patient.  The possibilities of pneumothorax, pneumonia, reaction to medication, pulmonary aspiration, perforation of a viscus, bleeding, failure to diagnose a condition and creating a complication requiring transfusion or operation were discussed with the patient who freely signed the consent.    Description of Procedure: The patient was seen in the Preoperative Area, was examined and was deemed appropriate to proceed.  The patient was taken to [facility] Endoscopy room 3, identified as [name] and the procedure verified as Flexible Video Fiberoptic Bronchoscopy.  A Time Out was held and the above information confirmed.   Prior to the date of the procedure a high-resolution CT scan of the chest was performed. Utilizing ION software program a virtual tracheobronchial tree was generated to allow the creation of distinct navigation pathways to the patient's parenchymal abnormalities. After being taken to the operating room general anesthesia was initiated and the patient  was orally intubated. The video fiberoptic bronchoscope was introduced via  the endotracheal tube and a general inspection was performed which showed normal right and left lung anatomy. Aspiration of the bilateral mainstems was completed to remove any remaining secretions. Robotic catheter inserted into patient's endotracheal tube.   Target #1 right lower lobe nodule: The distinct navigation pathways prepared prior to this procedure were then utilized to navigate to patient's lesion identified on CT scan. The robotic catheter was secured into place and the vision probe was withdrawn.  Lesion location was approximated using fluoroscopy.  Local registration and targeting was performed using [employer] Cios mobile C-arm three-dimensional imaging. Under fluoroscopic guidance transbronchial brushings, transbronchial needle biopsies, and transbronchial forceps biopsies were performed to be sent for cytology and pathology.    Target #2 left hilar mass: The distinct navigation pathways prepared prior to this procedure were then utilized to navigate to patient's lesion identified on CT scan. The robotic catheter was secured into place and the vision probe was withdrawn.  Lesion location was approximated using fluoroscopy.  Local registration and targeting was performed using [employer] Cios mobile C-arm three-dimensional imaging. Under fluoroscopic guidance transbronchial needle brushings, transbronchial needle biopsies, and transbronchial forceps biopsies were performed to be sent for cytology and pathology.  Needle-in-lesion was confirmed using Cios mobile C-arm.   At the end of the procedure a general airway inspection was performed and there was no evidence of active bleeding. The bronchoscope was removed.  The patient tolerated the procedure well. There was no significant blood loss and there were no obvious complications. A post-procedural chest x-ray is pending.  Samples Target #1: 1. Transbronchial brushings from right lower lobe nodule 2. Transbronchial Wang  needle biopsies from right lower lobe nodule 3. Transbronchial forceps biopsies from right lower lobe nodule  Samples Target #2: 1. Transbronchial needle brushings from left  hilar mass 2. Transbronchial Wang needle biopsies from left hilar mass 3. Transbronchial forceps biopsies from left hilar mass   Plans:  The patient will be discharged from the PACU to home when recovered from anesthesia and after chest x-ray is reviewed. We will review the cytology, pathology and microbiology results with the patient when they become available. Outpatient followup will be with Braden Caddy NP and Dr. Baldwin Levee, Dr. Maria Shiner.    Racheal Buddle, MD, PhD 02/26/2024, 9:28 AM [facility] [phone] or if no answer before 7:00PM call [phone] For any issues after 7:00PM please call eLink [phone]

## 2024-02-26 NOTE — Interval H&P Note (Signed)
 History and Physical Interval Note:  02/26/2024 7:24 AM  Bruce Smith  has presented today for surgery, with the diagnosis of Left hilar mass.  The various methods of treatment have been discussed with the patient and family. After consideration of risks, benefits and other options for treatment, the patient has consented to  Procedure(s): BRONCHOSCOPY, WITH BIOPSY USING ELECTROMAGNETIC NAVIGATION (Left) as a surgical intervention.  The patient's history has been reviewed, patient examined, no change in status, stable for surgery.  I have reviewed the patient's chart and labs.  Questions were answered to the patient's satisfaction.     Denson Flake

## 2024-02-26 NOTE — Anesthesia Postprocedure Evaluation (Signed)
 Anesthesia Post Note  Patient: Bruce Smith  Procedure(s) Performed: VIDEO BRONCHOSCOPY WITH ENDOBRONCHIAL NAVIGATION (Left) BRONCHOSCOPY, WITH BRUSH BIOPSY BRONCHOSCOPY, WITH NEEDLE ASPIRATION BIOPSY VIDEO BRONCHOSCOPY WITH RADIAL ENDOBRONCHIAL ULTRASOUND BRONCHOSCOPY, WITH BIOPSY     Patient location during evaluation: PACU Anesthesia Type: General Level of consciousness: awake and alert, oriented and patient cooperative Pain management: pain level controlled Vital Signs Assessment: post-procedure vital signs reviewed and stable Respiratory status: spontaneous breathing, nonlabored ventilation and respiratory function stable Cardiovascular status: blood pressure returned to baseline and stable Postop Assessment: no apparent nausea or vomiting Anesthetic complications: no   No notable events documented.  Last Vitals:  Vitals:   02/26/24 0945 02/26/24 1000  BP: (!) 103/48 (!) 116/49  Pulse: 62 60  Resp: (!) 8 (!) 9  Temp:    SpO2: 92% 93%    Last Pain:  Vitals:   02/26/24 1000  PainSc: Asleep                 Jacquelyne Matte

## 2024-02-26 NOTE — Discharge Instructions (Signed)
 Flexible Bronchoscopy, Care After This sheet gives you information about how to care for yourself after your test. Your doctor may also give you more specific instructions. If you have problems or questions, contact your doctor. Follow these instructions at home: Eating and drinking When you are wide awake, your numbness is gone and your cough and gag reflexes have come back, you may: Start eating only soft foods. Slowly drink liquids. Six hours after the test, go back to your normal diet. Driving Do not drive for 24 hours if you were given a medicine to help you relax (sedative). Do not drive or use heavy machinery while taking prescription pain medicine. General instructions Take over-the-counter and prescription medicines only as told by your doctor. Return to your normal activities as told. Ask what activities are safe for you. Do not use any products that have nicotine or tobacco in them. This includes cigarettes and e-cigarettes. If you need help quitting, ask your doctor. Keep all follow-up visits as told by your doctor. This is important. It is very important if you had a tissue sample (biopsy) taken. Get help right away if: You have shortness of breath that gets worse. You get light-headed. You feel like you are going to pass out (faint). You have chest pain. You cough up: More than a little blood. More blood than before. Summary Do not use cigarettes. Do not use e-cigarettes. Seek care in the Emergency Department right away if you have chest pain or shortness of breath. Call or MyChart Message our office for any questions or problems at [phone].    This information is not intended to replace advice given to you by your health care provider. Make sure you discuss any questions you have with your health care provider.

## 2024-02-26 NOTE — Interval H&P Note (Signed)
 History and Physical Interval Note:  02/26/2024 7:22 AM  Bruce Smith  has presented today for surgery, with the diagnosis of Left hilar mass, pulmonary nodules.  The various methods of treatment have been discussed with the patient and family. After consideration of risks, benefits and other options for treatment, the patient has consented to  Procedure(s): BRONCHOSCOPY, WITH BIOPSY USING ELECTROMAGNETIC NAVIGATION (Left) as a surgical intervention.  The patient's history has been reviewed, patient examined, no change in status, stable for surgery.  I have reviewed the patient's chart and labs.  Questions were answered to the patient's satisfaction.     Denson Flake

## 2024-02-26 NOTE — Anesthesia Procedure Notes (Signed)
 Procedure Name: Intubation Date/Time: 02/26/2024 7:41 AM  Performed by: Hershall Lory, CRNAPre-anesthesia Checklist: Patient identified, Emergency Drugs available, Suction available and Patient being monitored Patient Re-evaluated:Patient Re-evaluated prior to induction Oxygen Delivery Method: Circle system utilized Preoxygenation: Pre-oxygenation with 100% oxygen Induction Type: IV induction Ventilation: Mask ventilation without difficulty Laryngoscope Size: Glidescope Grade View: Grade II Tube type: Oral Tube size: 8.5 mm Number of attempts: 1 Airway Equipment and Method: Stylet and Oral airway Placement Confirmation: ETT inserted through vocal cords under direct vision, positive ETCO2 and breath sounds checked- equal and bilateral Tube secured with: Tape Dental Injury: Teeth and Oropharynx as per pre-operative assessment

## 2024-02-27 LAB — CYTOLOGY - NON PAP

## 2024-02-28 ENCOUNTER — Encounter (HOSPITAL_COMMUNITY): Payer: Self-pay | Admitting: Emergency Medicine

## 2024-02-29 ENCOUNTER — Telehealth: Payer: Self-pay

## 2024-02-29 NOTE — Telephone Encounter (Signed)
 Copied from CRM [id number]. Topic: Clinical - Lab/Test Results >> Feb 28, 2024 11:36 AM Crist Dominion wrote: Reason for CRM: Patients wife Jullie Oiler) is requesting a phone call back from Dr. Georgiana Kirks nurse as soon as possible as the patients biopsy results are showing available in MyChart. Jullie Oiler states Dr. Baldwin Levee knows how anxious they are about these results and are seeking answers right away.  Spoke with kimberly per DPR. Advised pts wife I was going to send there message to Dr. Baldwin Levee  Please advise Dr. Baldwin Levee of Bronch results as wife is very anxious after reading on Mychart

## 2024-02-29 NOTE — Telephone Encounter (Signed)
 Discussed the pathology results with the patient and his wife by phone.  The left upper lobe mass lesion is reported as lymphoid tissue with atypical cells and lymphoid tissue.  No clear evidence for primary lung malignancy.  I suspect that this is related to his underlying multiple myeloma, possible lymphoma.  It was sent for flow cytometry and hopefully that result will be useful.  Will forward this to Dr. Maria Shiner for his review

## 2024-03-06 ENCOUNTER — Ambulatory Visit: Admitting: Acute Care

## 2024-03-06 ENCOUNTER — Other Ambulatory Visit: Payer: Self-pay | Admitting: Hematology & Oncology

## 2024-03-06 LAB — SURGICAL PATHOLOGY

## 2024-03-07 ENCOUNTER — Telehealth: Payer: Self-pay | Admitting: Emergency Medicine

## 2024-03-07 ENCOUNTER — Encounter: Payer: Self-pay | Admitting: Emergency Medicine

## 2024-03-07 DIAGNOSIS — R918 Other nonspecific abnormal finding of lung field: Secondary | ICD-10-CM

## 2024-03-07 NOTE — Telephone Encounter (Signed)
 I discussed repeat bronchoscopy with the patient since flow cytometry was not able to be performed.Bruce Smith  He is agreeable.  I will work on getting this set up for next Monday.

## 2024-03-07 NOTE — Telephone Encounter (Signed)
 Reviewed the results of his bronchoscopy with Dr. Maria Shiner.  We do suspect some sort of hematologic malignancy but there was not enough material for flow cytometry.  Suspect he will need a repeat bronchoscopy versus mediastinoscopy (I am not sure that this would reach the lateral right hilum).

## 2024-03-08 ENCOUNTER — Other Ambulatory Visit: Payer: Self-pay

## 2024-03-08 ENCOUNTER — Encounter (HOSPITAL_COMMUNITY): Payer: Self-pay | Admitting: Emergency Medicine

## 2024-03-08 NOTE — Progress Notes (Signed)
 Anesthesia Chart Review:  Case: [id number] Date/Time: 03/11/24 1100   Procedure: BRONCHOSCOPY, WITH BIOPSY USING ELECTROMAGNETIC NAVIGATION (Left) - WITH FLUORO   Anesthesia type: General   Diagnosis: Mass of upper lobe of left lung [R91.8]   Pre-op diagnosis: Left upper lobe mass   [facility]   Surgeons: Denson Flake, MD       DISCUSSION: See my previous Anesthesia APP Progress Note from Date of Service 02/23/24. He is s/p  flexible video fiberoptic bronchoscopy with robotic assistance and biopsies on 02/26/24. By notes, LUL lesion reported as lymphoid tissue with atypical cells, no clear evidence of malignancy. Dr. Baldwin Levee reviewed with Dr. Maria Shiner. They suspected some sort of hematologic malignancy but there was not enough material for flow cytometry, so repeat bronchoscopy planned.   Anesthesia team to evaluate on the day of surgery. Ella Gun, PA-C Surgical Short Stay/Anesthesiology [facility] Phone [phone] [facility] Phone [phone] 03/08/2024 1:48 PM

## 2024-03-08 NOTE — Anesthesia Preprocedure Evaluation (Signed)
 Anesthesia Evaluation  Patient identified by MRN, date of birth, ID band Patient awake    Reviewed: Allergy & Precautions, NPO status , Patient's Chart, lab work & pertinent test results  History of Anesthesia Complications Negative for: history of anesthetic complications  Airway Mallampati: III  TM Distance: >3 FB Neck ROM: Full   Comment: Previous grade I view with MAC 3 Dental  (+) Dental Advisory Given   Pulmonary neg shortness of breath, neg sleep apnea, COPD (used Trelegy this morning, has not needed rescue inhaler since starting Trelegy),  COPD inhaler, neg recent URI, former smoker   Pulmonary exam normal breath sounds clear to auscultation       Cardiovascular hypertension (amlodipine , carvedilol ), Pt. on medications and Pt. on home beta blockers (-) angina + CAD  (-) Past MI and (-) Cardiac Stents + dysrhythmias (PAF) + Valvular Problems/Murmurs (mild) AS  Rhythm:Regular Rate:Normal  HLD, bilateral carotid plaques, AAA s/p repair 12/09/2022  TTE 01/31/2024: IMPRESSIONS    1. Left ventricular ejection fraction, by estimation, is 50 to 55%. The  left ventricle has low normal function. The left ventricle has no regional  wall motion abnormalities.   2. Right ventricular systolic function is normal. The right ventricular  size is normal.   3. The aortic valve was not well visualized. Aortic valve regurgitation  is not visualized. Mild aortic valve stenosis. Vmax 2.5 m/s, MG ,  AVA 1.5 cm^2, DI 0.52     Neuro/Psych  PSYCHIATRIC DISORDERS Anxiety Depression    negative neurological ROS     GI/Hepatic negative GI ROS, Neg liver ROS,,,  Endo/Other  Pre-diabetes (patient reports last Hgb A1c was normal)  Renal/GU negative Renal ROS     Musculoskeletal   Abdominal   Peds  Hematology negative hematology ROS (+) Lab Results      Component                Value               Date                      WBC                       6.2                 02/20/2024                HGB                      11.5 (L)            02/20/2024                HCT                      34.7 (L)            02/20/2024                MCV                      98.9                02/20/2024                PLT

## 2024-03-08 NOTE — Progress Notes (Signed)
 SDW call  Patient's wife, Bruce Smith was given pre-op instructions over the phone. She verbalized understanding of instructions provided.    PCP - Dr. Artemus Biles Cardiologist - Dr. Avery Bodo Pulmonary:    PPM/ICD - denies Device Orders - na Rep Notified - na   Chest x-ray - 02/26/2024 EKG -  11/12/2023 Stress Test -  ECHO - 10/11/2023 Cardiac Cath -  03/09/2023  Sleep Study/sleep apnea/CPAP: denies  Non-diabetic  Blood Thinner Instructions:denies  Aspirin  Instructions:states last dose 03/08/2024   ERAS Protcol - NPO   Anesthesia review: Yes. Multiple myloma, CAD, HTN, MI stem cell transplant   Patient denies shortness of breath, fever, cough and chest pain over the phone call  Your procedure is scheduled on Monday Mar 11, 2024  Report to [facility] Main Entrance "A" at  0845 A.M., then check in with the Admitting office.  Call this number if you have problems the morning of surgery:  [phone]   If you have any questions prior to your surgery date call [phone]: Open Monday-Friday 8am-4pm If you experience any cold or flu symptoms such as cough, fever, chills, shortness of breath, etc. between now and your scheduled surgery, please notify us  at the above number    Remember:  Do not eat or drink after midnight the night before your surgery  Take these medicines the morning of surgery with A SIP OF WATER :  Tylenol , cymbalta , famvir , keppra , metoprolol , protonix , lyrica , rosuvastatin , tramadol   As needed: Zofrin, compazine   As of today, STOP taking any Aspirin  (unless otherwise instructed by your surgeon) Aleve, Naproxen, Ibuprofen , Motrin, Advil, Goody's, BC's, all herbal medications, fish oil, and all vitamins.

## 2024-03-11 ENCOUNTER — Ambulatory Visit (HOSPITAL_COMMUNITY): Admitting: Vascular Surgery

## 2024-03-11 ENCOUNTER — Ambulatory Visit (HOSPITAL_COMMUNITY)

## 2024-03-11 ENCOUNTER — Ambulatory Visit (HOSPITAL_COMMUNITY)
Admission: RE | Admit: 2024-03-11 | Discharge: 2024-03-11 | Disposition: A | Discharge status: Home / Self Care | Attending: Emergency Medicine | Admitting: Emergency Medicine

## 2024-03-11 ENCOUNTER — Encounter (HOSPITAL_COMMUNITY)
Admission: RE | Disposition: A | Discharge status: Home / Self Care | Payer: Self-pay | Source: Home / Self Care | Attending: Emergency Medicine

## 2024-03-11 ENCOUNTER — Other Ambulatory Visit: Payer: Self-pay

## 2024-03-11 ENCOUNTER — Encounter (HOSPITAL_COMMUNITY): Payer: Self-pay | Admitting: Emergency Medicine

## 2024-03-11 ENCOUNTER — Ambulatory Visit (HOSPITAL_BASED_OUTPATIENT_CLINIC_OR_DEPARTMENT_OTHER): Admitting: Vascular Surgery

## 2024-03-11 DIAGNOSIS — E785 Hyperlipidemia, unspecified: Secondary | ICD-10-CM

## 2024-03-11 DIAGNOSIS — Z87891 Personal history of nicotine dependence: Secondary | ICD-10-CM | POA: Diagnosis not present

## 2024-03-11 DIAGNOSIS — I252 Old myocardial infarction: Secondary | ICD-10-CM | POA: Insufficient documentation

## 2024-03-11 DIAGNOSIS — I2510 Atherosclerotic heart disease of native coronary artery without angina pectoris: Secondary | ICD-10-CM | POA: Diagnosis not present

## 2024-03-11 DIAGNOSIS — K219 Gastro-esophageal reflux disease without esophagitis: Secondary | ICD-10-CM | POA: Insufficient documentation

## 2024-03-11 DIAGNOSIS — F17220 Nicotine dependence, chewing tobacco, uncomplicated: Secondary | ICD-10-CM

## 2024-03-11 DIAGNOSIS — R918 Other nonspecific abnormal finding of lung field: Secondary | ICD-10-CM

## 2024-03-11 DIAGNOSIS — C9030 Solitary plasmacytoma not having achieved remission: Secondary | ICD-10-CM | POA: Diagnosis not present

## 2024-03-11 DIAGNOSIS — I10 Essential (primary) hypertension: Secondary | ICD-10-CM | POA: Diagnosis not present

## 2024-03-11 DIAGNOSIS — Z923 Personal history of irradiation: Secondary | ICD-10-CM | POA: Insufficient documentation

## 2024-03-11 DIAGNOSIS — Z951 Presence of aortocoronary bypass graft: Secondary | ICD-10-CM | POA: Diagnosis not present

## 2024-03-11 HISTORY — PX: BRONCHIAL BIOPSY: SHX5109

## 2024-03-11 HISTORY — PX: BRONCHIAL BRUSHINGS: SHX5108

## 2024-03-11 HISTORY — PX: BRONCHOSCOPY, WITH BIOPSY USING ELECTROMAGNETIC NAVIGATION: SHX7536

## 2024-03-11 HISTORY — PX: HEMOSTASIS CLIP PLACEMENT: SHX6857

## 2024-03-11 HISTORY — PX: BRONCHIAL NEEDLE ASPIRATION BIOPSY: SHX5106

## 2024-03-11 SURGERY — BRONCHOSCOPY, WITH BIOPSY USING ELECTROMAGNETIC NAVIGATION
Anesthesia: General | Laterality: Left

## 2024-03-11 MED ORDER — PHENYLEPHRINE 80 MCG/ML (10ML) SYRINGE FOR IV PUSH (FOR BLOOD PRESSURE SUPPORT)
PREFILLED_SYRINGE | INTRAVENOUS | Status: DC | PRN
  Administered 2024-03-11: 160 ug via INTRAVENOUS

## 2024-03-11 MED ORDER — CHLORHEXIDINE GLUCONATE 0.12 % MT SOLN: 15.0000 mL | Freq: Once | OROMUCOSAL | Status: AC

## 2024-03-11 MED ORDER — ONDANSETRON HCL 4 MG/2ML IJ SOLN
INTRAMUSCULAR | Status: DC | PRN
  Administered 2024-03-11: 4 mg via INTRAVENOUS

## 2024-03-11 MED ORDER — ROCURONIUM BROMIDE 10 MG/ML (PF) SYRINGE
PREFILLED_SYRINGE | INTRAVENOUS | Status: DC | PRN
  Administered 2024-03-11: 20 mg via INTRAVENOUS
  Administered 2024-03-11: 60 mg via INTRAVENOUS
  Administered 2024-03-11: 20 mg via INTRAVENOUS

## 2024-03-11 MED ORDER — EPINEPHRINE (ANAPHYLAXIS) 30 MG/30ML IJ SOLN
INTRAMUSCULAR | Status: DC | PRN
  Administered 2024-03-11: 4 mL via ENDOTRACHEOPULMONARY
  Administered 2024-03-11: 2 mL via ENDOTRACHEOPULMONARY

## 2024-03-11 MED ORDER — LIDOCAINE 2% (20 MG/ML) 5 ML SYRINGE
INTRAMUSCULAR | Status: DC | PRN
  Administered 2024-03-11: 60 mg via INTRAVENOUS

## 2024-03-11 MED ORDER — CHLORHEXIDINE GLUCONATE 0.12 % MT SOLN
OROMUCOSAL | Status: AC
  Administered 2024-03-11: 15 mL via OROMUCOSAL
  Filled 2024-03-11: qty 15

## 2024-03-11 MED ORDER — LACTATED RINGERS IV SOLN: INTRAVENOUS | Status: DC | PRN

## 2024-03-11 MED ORDER — FENTANYL CITRATE (PF) 100 MCG/2ML IJ SOLN: 25.0000 ug | INTRAMUSCULAR | Status: DC | PRN

## 2024-03-11 MED ORDER — SUGAMMADEX SODIUM 200 MG/2ML IV SOLN
INTRAVENOUS | Status: DC | PRN
  Administered 2024-03-11: 200 mg via INTRAVENOUS

## 2024-03-11 MED ORDER — LACTATED RINGERS IV SOLN: INTRAVENOUS | Status: DC

## 2024-03-11 MED ORDER — PROPOFOL 10 MG/ML IV BOLUS
INTRAVENOUS | Status: DC | PRN
  Administered 2024-03-11: 150 mg via INTRAVENOUS

## 2024-03-11 MED ORDER — AMISULPRIDE (ANTIEMETIC) 5 MG/2ML IV SOLN: 10.0000 mg | Freq: Once | INTRAVENOUS | Status: DC | PRN

## 2024-03-11 MED ORDER — EPINEPHRINE 1 MG/10ML IJ SOSY
PREFILLED_SYRINGE | INTRAMUSCULAR | Status: AC
  Filled 2024-03-11: qty 10

## 2024-03-11 NOTE — Anesthesia Procedure Notes (Signed)
 Procedure Name: Intubation Date/Time: 03/11/2024 11:32 AM  Performed by: Katrinka Parr, CRNAPre-anesthesia Checklist: Patient identified, Emergency Drugs available, Suction available and Patient being monitored Patient Re-evaluated:Patient Re-evaluated prior to induction Oxygen Delivery Method: Circle System Utilized Preoxygenation: Pre-oxygenation with 100% oxygen Induction Type: IV induction Ventilation: Mask ventilation without difficulty Laryngoscope Size: Glidescope and 4 Grade View: Grade I Tube type: Oral Number of attempts: 1 Airway Equipment and Method: Stylet and Oral airway Placement Confirmation: ETT inserted through vocal cords under direct vision, positive ETCO2 and breath sounds checked- equal and bilateral Secured at: 23 cm Tube secured with: Tape Dental Injury: Teeth and Oropharynx as per pre-operative assessment

## 2024-03-11 NOTE — Anesthesia Postprocedure Evaluation (Signed)
 Anesthesia Post Note  Patient: Bruce Smith  Procedure(s) Performed: BRONCHOSCOPY, WITH BIOPSY USING ELECTROMAGNETIC NAVIGATION (Left) BRONCHOSCOPY, WITH BRUSH BIOPSY BRONCHOSCOPY, WITH NEEDLE ASPIRATION BIOPSY BRONCHOSCOPY, WITH BIOPSY CONTROL OF HEMORRHAGE, epi and cold saline     Patient location during evaluation: PACU Anesthesia Type: General Level of consciousness: awake and alert Pain management: pain level controlled Vital Signs Assessment: post-procedure vital signs reviewed and stable Respiratory status: spontaneous breathing, nonlabored ventilation, respiratory function stable and patient connected to nasal cannula oxygen Cardiovascular status: blood pressure returned to baseline and stable Postop Assessment: no apparent nausea or vomiting Anesthetic complications: no  No notable events documented.  Last Vitals:  Vitals:   03/11/24 1340 03/11/24 1350  BP: (!) 111/57 131/60  Pulse: 66 63  Resp: 18 13  Temp:    SpO2: 97% 94%    Last Pain:  Vitals:   03/11/24 1350  TempSrc:   PainSc: 0-No pain                 Melvenia Stabs

## 2024-03-11 NOTE — Discharge Instructions (Addendum)
 Flexible Bronchoscopy, Care After This sheet gives you information about how to care for yourself after your test. Your doctor may also give you more specific instructions. If you have problems or questions, contact your doctor. Follow these instructions at home: Eating and drinking When you are wide awake, your numbness is gone and your cough and gag reflexes have come back, you may: Start eating only soft foods. Slowly drink liquids. Six hours after the test, go back to your normal diet. Driving Do not drive for 24 hours if you were given a medicine to help you relax (sedative). Do not drive or use heavy machinery while taking prescription pain medicine. General instructions Take over-the-counter and prescription medicines only as told by your doctor. Return to your normal activities as told. Ask what activities are safe for you. Do not use any products that have nicotine or tobacco in them. This includes cigarettes and e-cigarettes. If you need help quitting, ask your doctor. Keep all follow-up visits as told by your doctor. This is important. It is very important if you had a tissue sample (biopsy) taken. Get help right away if: You have shortness of breath that gets worse. You get light-headed. You feel like you are going to pass out (faint). You have chest pain. You cough up: More than a little blood. More blood than before. Summary Do not use cigarettes. Do not use e-cigarettes. Seek care in the Emergency Department right away if you have chest pain or shortness of breath. Call or MyChart Message our office for any questions or problems at [phone].  Okay to restart your aspirin  on 03/12/2024   This information is not intended to replace advice given to you by your health care provider. Make sure you discuss any questions you have with your health care provider.

## 2024-03-11 NOTE — Interval H&P Note (Signed)
 History and Physical Interval Note:  03/11/2024 9:12 AM  Bruce Smith  has presented today for surgery, with the diagnosis of Left upper lobe mass.  The various methods of treatment have been discussed with the patient and family. After consideration of risks, benefits and other options for treatment, the patient has consented to  Procedure(s) with comments: BRONCHOSCOPY, WITH BIOPSY USING ELECTROMAGNETIC NAVIGATION (Left) - WITH FLUORO as a surgical intervention.  The patient's history has been reviewed, patient examined, no change in status, stable for surgery.  I have reviewed the patient's chart and labs.  Questions were answered to the patient's satisfaction.     Denson Flake

## 2024-03-11 NOTE — Transfer of Care (Signed)
 Immediate Anesthesia Transfer of Care Note  Patient: Bruce Smith  Procedure(s) Performed: BRONCHOSCOPY, WITH BIOPSY USING ELECTROMAGNETIC NAVIGATION (Left) BRONCHOSCOPY, WITH BRUSH BIOPSY BRONCHOSCOPY, WITH NEEDLE ASPIRATION BIOPSY BRONCHOSCOPY, WITH BIOPSY CONTROL OF HEMORRHAGE, epi and cold saline  Patient Location: Endoscopy Unit  Anesthesia Type:General  Level of Consciousness: drowsy and patient cooperative  Airway & Oxygen Therapy: Patient Spontanous Breathing and Patient connected to nasal cannula oxygen  Post-op Assessment: Report given to RN and Post -op Vital signs reviewed and stable  Post vital signs: Reviewed and stable  Last Vitals:  Vitals Value Taken Time  BP 130/51 03/11/24 1320  Temp 36.4 C 03/11/24 1315  Pulse 68 03/11/24 1324  Resp 17 03/11/24 1324  SpO2 95 % 03/11/24 1324  Vitals shown include unfiled device data.  Last Pain:  Vitals:   03/11/24 1315  TempSrc: Axillary  PainSc: Asleep         Complications: No notable events documented.

## 2024-03-11 NOTE — Op Note (Signed)
 Video Bronchoscopy with Robotic Assisted Bronchoscopic Navigation   Date of Operation: 03/11/2024   Pre-op Diagnosis: Left upper lobe mass, bilateral pulmonary nodules  Post-op Diagnosis: Same  Surgeon: Racheal Buddle  Assistants: None  Anesthesia: General endotracheal anesthesia  Operation: Flexible video fiberoptic bronchoscopy with robotic assistance and biopsies.  Estimated Blood Loss: Minimal  Complications: None  Indications and History: Bruce Smith is a 73 y.o. male with history of multiple myeloma and remote tobacco use.  He underwent navigational bronchoscopy.  Recommendation made to achieve a tissue diagnosis via robotic assisted navigational bronchoscopy 02/26/24 that showed atypical cells, lymphocytes but no definitive diagnosis.  There was not enough material to attempt flow cytometry.  He now will undergo repeat bronchoscopy to get more sample.  The risks, benefits, complications, treatment options and expected outcomes were discussed with the patient.  The possibilities of pneumothorax, pneumonia, reaction to medication, pulmonary aspiration, perforation of a viscus, bleeding, failure to diagnose a condition and creating a complication requiring transfusion or operation were discussed with the patient who freely signed the consent.    Description of Procedure: The patient was seen in the Preoperative Area, was examined and was deemed appropriate to proceed.  The patient was taken to [facility] Endoscopy room 3, identified as Bruce Smith and the procedure verified as Flexible Video Fiberoptic Bronchoscopy.  A Time Out was held and the above information confirmed.   Prior to the date of the procedure a high-resolution CT scan of the chest was performed. Utilizing ION software program a virtual tracheobronchial tree was generated to allow the creation of distinct navigation pathways to the patient's parenchymal abnormalities. After being taken to the operating room general  anesthesia was initiated and the patient  was orally intubated. The video fiberoptic bronchoscope was introduced via the endotracheal tube and a general inspection was performed which showed normal right sided exam.  The left lower lobe airways were normal.  There was mucosal irregularity in one of the segmental airways of the left upper lobe adjacent to the hilar mass lesion, question due to prior sampling versus true endobronchial lesion.  Endobronchial forceps biopsies were performed on this mucosal to be sent for cytology. Aspiration of the bilateral mainstems was completed to remove any remaining secretions. Robotic catheter inserted into patient's endotracheal tube.   Target #1 right lower lobe pulmonary nodule: The distinct navigation pathways prepared prior to this procedure were then utilized to navigate to patient's lesion identified on CT scan. The robotic catheter was secured into place and the vision probe was withdrawn.  Lesion location was approximated using fluoroscopy.  Local registration and targeting was performed using [employer] Cios mobile C-arm three-dimensional imaging. Under fluoroscopic guidance transbronchial needle brushings, transbronchial needle biopsies, and transbronchial forceps biopsies were performed to be sent for cytology and pathology.  Needle-in-lesion was confirmed using Cios mobile C-arm.    Target #2 left upper lobe perihilar mass: The distinct navigation pathways prepared prior to this procedure were then utilized to navigate to patient's lesion identified on CT scan. The robotic catheter was secured into place and the vision probe was withdrawn.  Lesion location was approximated using fluoroscopy.  Under fluoroscopic guidance transbronchial needle biopsies were performed to be sent for cytology and pathology.  A single needle biopsy was sent for culture data.    At the end of the procedure a general airway inspection was performed and there was no  evidence of active bleeding. The bronchoscope was removed.  The patient tolerated the procedure  well. There was no significant blood loss and there were no obvious complications. A post-procedural chest x-ray is pending.  Samples Target #1: 1. Transbronchial needle brushings from right lower lobe nodule 2. Transbronchial Wang needle biopsies from right lower lobe nodule 3. Transbronchial forceps biopsies from right lower lobe nodule  Samples Target #2: 1. Transbronchial Wang needle biopsies from left upper lobe mass  Other samples: 1. Endobronchial forceps biopsies from left upper lobe segmental airway  Plans:  The patient will be discharged from the PACU to home when recovered from anesthesia and after chest x-ray is reviewed. We will review the cytology, pathology and microbiology results with the patient when they become available. Outpatient followup will be with Dr. Baldwin Levee and Dr. Maria Shiner.    Racheal Buddle, MD, PhD 03/11/2024, 1:14 PM [facility] [phone] or if no answer before 7:00PM call [phone] For any issues after 7:00PM please call eLink [phone]

## 2024-03-13 ENCOUNTER — Encounter (HOSPITAL_COMMUNITY): Payer: Self-pay | Admitting: Emergency Medicine

## 2024-03-13 LAB — ACID FAST SMEAR (AFB, MYCOBACTERIA): Acid Fast Smear: NEGATIVE

## 2024-03-14 ENCOUNTER — Other Ambulatory Visit: Payer: Self-pay

## 2024-03-16 LAB — AEROBIC/ANAEROBIC CULTURE W GRAM STAIN (SURGICAL/DEEP WOUND)
Culture: NO GROWTH
Gram Stain: NONE SEEN

## 2024-03-18 LAB — CYTOLOGY - NON PAP

## 2024-03-18 LAB — SURGICAL PATHOLOGY

## 2024-03-21 ENCOUNTER — Other Ambulatory Visit: Payer: Self-pay

## 2024-03-28 ENCOUNTER — Other Ambulatory Visit: Payer: Self-pay

## 2024-04-05 ENCOUNTER — Other Ambulatory Visit: Payer: Self-pay | Admitting: Hematology & Oncology

## 2024-04-11 LAB — FUNGAL ORGANISM REFLEX

## 2024-04-11 LAB — FUNGUS CULTURE RESULT

## 2024-04-11 LAB — FUNGUS CULTURE WITH STAIN

## 2024-04-22 ENCOUNTER — Other Ambulatory Visit: Payer: Self-pay

## 2024-04-24 LAB — ACID FAST CULTURE WITH REFLEXED SENSITIVITIES (MYCOBACTERIA): Acid Fast Culture: NEGATIVE

## 2024-05-13 ENCOUNTER — Other Ambulatory Visit: Payer: Self-pay | Admitting: Hematology & Oncology

## 2024-05-13 DIAGNOSIS — C9000 Multiple myeloma not having achieved remission: Secondary | ICD-10-CM

## 2024-05-13 DIAGNOSIS — E440 Moderate protein-calorie malnutrition: Secondary | ICD-10-CM

## 2024-05-15 ENCOUNTER — Other Ambulatory Visit: Payer: Self-pay | Admitting: Hematology & Oncology

## 2024-05-16 ENCOUNTER — Other Ambulatory Visit: Payer: Self-pay

## 2024-05-16 DIAGNOSIS — C9000 Multiple myeloma not having achieved remission: Secondary | ICD-10-CM

## 2024-05-16 MED ORDER — TRAMADOL HCL 50 MG PO TABS: ORAL_TABLET | ORAL | 0 refills | Status: DC

## 2024-05-17 ENCOUNTER — Other Ambulatory Visit: Payer: Self-pay

## 2024-06-13 ENCOUNTER — Other Ambulatory Visit: Payer: Self-pay

## 2024-06-20 ENCOUNTER — Other Ambulatory Visit: Payer: Self-pay | Admitting: *Deleted

## 2024-06-20 DIAGNOSIS — C9000 Multiple myeloma not having achieved remission: Secondary | ICD-10-CM

## 2024-06-20 DIAGNOSIS — C9001 Multiple myeloma in remission: Secondary | ICD-10-CM

## 2024-06-21 ENCOUNTER — Inpatient Hospital Stay (HOSPITAL_BASED_OUTPATIENT_CLINIC_OR_DEPARTMENT_OTHER): Admitting: Hematology & Oncology

## 2024-06-21 ENCOUNTER — Inpatient Hospital Stay: Attending: Hematology & Oncology

## 2024-06-21 ENCOUNTER — Encounter: Payer: Self-pay | Admitting: Hematology & Oncology

## 2024-06-21 ENCOUNTER — Inpatient Hospital Stay

## 2024-06-21 VITALS — BP 165/79 | HR 82 | Temp 98.1°F | Resp 20 | Ht 68.0 in | Wt 190.0 lb

## 2024-06-21 DIAGNOSIS — C9001 Multiple myeloma in remission: Secondary | ICD-10-CM

## 2024-06-21 DIAGNOSIS — C419 Malignant neoplasm of bone and articular cartilage, unspecified: Secondary | ICD-10-CM

## 2024-06-21 DIAGNOSIS — Z9221 Personal history of antineoplastic chemotherapy: Secondary | ICD-10-CM | POA: Diagnosis not present

## 2024-06-21 DIAGNOSIS — C9000 Multiple myeloma not having achieved remission: Secondary | ICD-10-CM | POA: Diagnosis present

## 2024-06-21 DIAGNOSIS — M898X9 Other specified disorders of bone, unspecified site: Secondary | ICD-10-CM | POA: Diagnosis not present

## 2024-06-21 DIAGNOSIS — Z9484 Stem cells transplant status: Secondary | ICD-10-CM | POA: Diagnosis not present

## 2024-06-21 DIAGNOSIS — Z923 Personal history of irradiation: Secondary | ICD-10-CM | POA: Diagnosis not present

## 2024-06-21 LAB — CBC WITH DIFFERENTIAL (CANCER CENTER ONLY)
Abs Immature Granulocytes: 0.01 K/uL (ref 0.00–0.07)
Basophils Absolute: 0 K/uL (ref 0.0–0.1)
Basophils Relative: 1 %
Eosinophils Absolute: 0 K/uL (ref 0.0–0.5)
Eosinophils Relative: 1 %
HCT: 29 % — ABNORMAL LOW (ref 39.0–52.0)
Hemoglobin: 10 g/dL — ABNORMAL LOW (ref 13.0–17.0)
Immature Granulocytes: 1 %
Lymphocytes Relative: 15 %
Lymphs Abs: 0.3 K/uL — ABNORMAL LOW (ref 0.7–4.0)
MCH: 36 pg — ABNORMAL HIGH (ref 26.0–34.0)
MCHC: 34.5 g/dL (ref 30.0–36.0)
MCV: 104.3 fL — ABNORMAL HIGH (ref 80.0–100.0)
Monocytes Absolute: 0.6 K/uL (ref 0.1–1.0)
Monocytes Relative: 30 %
Neutro Abs: 1.1 K/uL — ABNORMAL LOW (ref 1.7–7.7)
Neutrophils Relative %: 52 %
Platelet Count: 54 K/uL — ABNORMAL LOW (ref 150–400)
RBC: 2.78 MIL/uL — ABNORMAL LOW (ref 4.22–5.81)
RDW: 15 % (ref 11.5–15.5)
WBC Count: 2.2 K/uL — ABNORMAL LOW (ref 4.0–10.5)
nRBC: 0 % (ref 0.0–0.2)

## 2024-06-21 LAB — CMP (CANCER CENTER ONLY)
ALT: 52 U/L — ABNORMAL HIGH (ref 0–44)
AST: 27 U/L (ref 15–41)
Albumin: 4.2 g/dL (ref 3.5–5.0)
Alkaline Phosphatase: 113 U/L (ref 38–126)
Anion gap: 11 (ref 5–15)
BUN: 18 mg/dL (ref 8–23)
CO2: 25 mmol/L (ref 22–32)
Calcium: 9.8 mg/dL (ref 8.9–10.3)
Chloride: 104 mmol/L (ref 98–111)
Creatinine: 0.94 mg/dL (ref 0.61–1.24)
GFR, Estimated: >60 mL/min (ref >60)
Glucose, Bld: 107 mg/dL — ABNORMAL HIGH (ref 70–99)
Potassium: 4.3 mmol/L (ref 3.5–5.1)
Sodium: 140 mmol/L (ref 135–145)
Total Bilirubin: 0.4 mg/dL (ref 0.0–1.2)
Total Protein: 6.4 g/dL — ABNORMAL LOW (ref 6.5–8.1)

## 2024-06-21 LAB — SAMPLE TO BLOOD BANK

## 2024-06-21 LAB — LACTATE DEHYDROGENASE: LDH: 273 U/L — ABNORMAL HIGH (ref 98–192)

## 2024-06-21 LAB — MAGNESIUM: Magnesium: 2.1 mg/dL (ref 1.7–2.4)

## 2024-06-21 NOTE — Progress Notes (Signed)
 BP remains elevated, 165 79, instructed to monitor at home and notify PCP if it remains over 140/90. Verbalizied understanding.

## 2024-06-21 NOTE — Progress Notes (Signed)
 [facility] On[facility] Follow Up Visit  Bruce Smith [id number] Jun 08, 1951 73 y.o. 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 - t(11:14), 13q-, 1p-,1q-  Past Therapy: Status post surgical repair of left femur-07/14/2020 XRT to left shoulder and left hip Xgeva  120 mg subcu every 3 months-next dose in December 2022  Faspro/Velcade /Revlimid /Decadron  -- s/p cycle #2 -- start on       08/06/2020  ( Revlimid  is 14 on /14 off)    Current Therapy:        ASCT -- [facility] on -01/22/2021 Faspro - q month -- maintenance -- start on 05/13/2021 -- d/c on 10/10/2023 Zometa  4 mg IV every 3 months-next dose in 02/2024  Radiation therapy to T12, left iliac and left humerus --completed on 10/04/2022 --XRT to right femoral head -start 01/03/2023 XRT to T9 lesion -to be completed on 06/18/2023 Kyprolis /Pomalyst  -- s/p cycle #3 - start on 10/19/2023 IVIG 40 g monthly Carvykti -  given at [facility] on 03/27/2024    Interim History:  Bruce Smith is here today for follow-up.  He did have the CAR-T therapy at [facility].  He received Carvykti on 03/27/2024.  Unfortunately, he has had quite a few side effects from this.  He had the CRS.  He has had ICANS.  He has a walker right now.  He does have problems with falling.  He is somewhat unsteady.  I think he is getting some physical therapy.  He goes back to [facility] next week to have scans done and to have a bone marrow biopsy done.  We last saw him back in April.  At that time, he had a fairly good response to treatment.  He was on Kyprolis /pomalidomide .  He is eating okay.  He has had no nausea or vomiting.  He has had no cough or shortness of breath.  He has had no bleeding.  There has been no leg swelling.  He still has a chronic bone pain.  Overall, I would have to say that his performance status is probably ECOG 1.       Medications:  Allergies as of  06/21/2024   No Known Allergies      Medication List        Accurate as of June 21, 2024  9:06 AM. If you have any questions, ask your nurse or doctor.          STOP taking these medications    dexamethasone  4 MG tablet Commonly known as: DECADRON  Stopped by: Bruce Smith   pomalidomide  3 MG capsule Commonly known as: Pomalyst  Stopped by: Bruce Smith       TAKE these medications    acetaminophen  500 MG tablet Commonly known as: TYLENOL  Take 2 tablets (1,000 mg total) by mouth 4 (four) times daily.   ALLERGY PO Take 10 mg by mouth daily.   aspirin  EC 81 MG tablet Take 81 mg by mouth at bedtime.   benzonatate  200 MG capsule Commonly known as: TESSALON  Take 1 capsule (200 mg total) by mouth 3 (three) times daily as needed for cough.   CALCIUM  600 PO Take 600 mg by mouth daily.   DULoxetine  60 MG capsule Commonly known as: CYMBALTA  Take 1 capsule (60 mg total) by mouth daily.   famciclovir  250 MG tablet Commonly known as: FAMVIR  TAKE 1 TABLET BY MOUTH EVERY DAY   guaiFENesin  600 MG 12 hr tablet Commonly known as:  MUCINEX  Take 1 tablet (600 mg total) by mouth 2 (two) times daily as needed for to loosen phlegm.   Ipratropium-Albuterol  20-100 MCG/ACT Aers respimat Commonly known as: COMBIVENT Inhale 1 puff into the lungs every 6 (six) hours as needed for wheezing.   levETIRAcetam  500 MG tablet Commonly known as: KEPPRA  TAKE 1 TABLET BY MOUTH 2 TIMES A DAY   Magnesium  250 MG Caps Take 250 mg by mouth daily.   metoprolol  succinate 50 MG 24 hr tablet Commonly known as: TOPROL -XL Take 50 mg by mouth daily.   naloxone  4 MG/0.1ML Liqd nasal spray kit Commonly known as: NARCAN  Administer as directed in each nostril in the event of an overdose. May repeat in 15 minutes.   nitroGLYCERIN  0.4 MG SL tablet Commonly known as: NITROSTAT  Place 0.4 mg under the tongue every 5 (five) minutes as needed.   ondansetron  8 MG tablet Commonly known as:  Zofran  Take 1 tablet (8 mg total) by mouth every 8 (eight) hours as needed for nausea or vomiting.   pantoprazole  40 MG tablet Commonly known as: PROTONIX  TAKE 1 TABLET BY MOUTH 2 TIMES A DAY BEFORE MEALS FOR ACID REFLUX   polyethylene glycol powder 17 GM/SCOOP powder Commonly known as: GLYCOLAX /MIRALAX  Take 17 g by mouth daily.   pregabalin  200 MG capsule Commonly known as: LYRICA  TAKE 1 CAPSULE BY MOUTH 2 TIMES DAILY   prochlorperazine  10 MG tablet Commonly known as: COMPAZINE  Take 1 tablet (10 mg total) by mouth every 6 (six) hours as needed for nausea or vomiting.   rosuvastatin  40 MG tablet Commonly known as: CRESTOR  TAKE 1 TABLET BY MOUTH EVERY DAY FOR CHOLESTEROL   saccharomyces boulardii 250 MG capsule Commonly known as: FLORASTOR Take 250 mg by mouth daily.   tamsulosin  0.4 MG Caps capsule Commonly known as: FLOMAX  TAKE 1 CAPSULE BY MOUTH EVERY NIGHT AT BEDTIME   traMADol  50 MG tablet Commonly known as: ULTRAM  TAKE 2 TABLETS BY MOUTH EVERY 6 HOURS ASNEEDED FOR PAIN        Allergies: No Known Allergies  Past Medical History, Surgical history, Social history, and Family History were reviewed and updated.  Review of Systems: Review of Systems  Constitutional: Negative.   HENT: Negative.    Eyes: Negative.   Respiratory: Negative.    Cardiovascular: Negative.   Gastrointestinal: Negative.   Genitourinary: Negative.   Musculoskeletal:  Positive for joint pain.  Skin: Negative.   Neurological: Negative.   Endo/Heme/Allergies: Negative.   Psychiatric/Behavioral: Negative.       Physical Exam:  height is 5' 8 (1.727 m) and weight is 190 lb (86.2 kg). His oral temperature is 98.1 F (36.7 C). His blood pressure is 165/79 (abnormal) and his pulse is 82. His respiration is 20 and oxygen saturation is 98%.   Wt Readings from Last 3 Encounters:  06/21/24 190 lb (86.2 kg)  03/11/24 206 lb (93.4 kg)  02/26/24 205 lb (93 kg)    Physical Exam Vitals  reviewed.  Constitutional:      Comments: This is a well-developed and well-nourished white male in no obvious distress.  He has a nicely healed median sternotomy scar.  HENT:     Head: Normocephalic and atraumatic.  Eyes:     Pupils: Pupils are equal, round, and reactive to light.  Cardiovascular:     Rate and Rhythm: Normal rate and regular rhythm.     Heart sounds: Normal heart sounds.  Pulmonary:     Effort: Pulmonary effort is normal.  Breath sounds: Normal breath sounds.  Abdominal:     General: Bowel sounds are normal.     Palpations: Abdomen is soft.  Musculoskeletal:        General: No tenderness or deformity. Normal range of motion.     Cervical back: Normal range of motion.     Comments: There is pain to palpation in the left hip area.  This is in the lateral hip area.  He has some decreased range of motion.  Lymphadenopathy:     Cervical: No cervical adenopathy.  Skin:    General: Skin is warm and dry.     Findings: No erythema or rash.  Neurological:     Mental Status: He is alert and oriented to person, place, and time.  Psychiatric:        Behavior: Behavior normal.        Thought Content: Thought content normal.        Judgment: Judgment normal.      Lab Results  Component Value Date   WBC 2.2 (L) 06/21/2024   HGB 10.0 (L) 06/21/2024   HCT 29.0 (L) 06/21/2024   MCV 104.3 (H) 06/21/2024   PLT 54 (L) 06/21/2024   Lab Results  Component Value Date   FERRITIN 135 11/15/2023   IRON 35 (L) 11/15/2023   TIBC 225 (L) 11/15/2023   UIBC 190 11/15/2023   IRONPCTSAT 16 (L) 11/15/2023   Lab Results  Component Value Date   RETICCTPCT 1.3 11/14/2023   RBC 2.78 (L) 06/21/2024   Lab Results  Component Value Date   KPAFRELGTCHN 42.6 (H) 02/15/2024   LAMBDASER 17.9 02/15/2024   KAPLAMBRATIO 2.38 (H) 02/15/2024   Lab Results  Component Value Date   IGGSERUM 863 02/15/2024   IGA 62 02/15/2024   IGMSERUM 31 02/15/2024   Lab Results  Component Value  Date   TOTALPROTELP 5.6 (L) 02/15/2024   ALBUMINELP 3.1 02/15/2024   A1GS 0.3 02/15/2024   A2GS 0.8 02/15/2024   BETS 0.7 02/15/2024   GAMS 0.7 02/15/2024   MSPIKE Not Observed 02/15/2024     Chemistry      Component Value Date/Time   NA 140 06/21/2024 0823   K 4.3 06/21/2024 0823   CL 104 06/21/2024 0823   CO2 25 06/21/2024 0823   BUN 18 06/21/2024 0823   CREATININE 0.94 06/21/2024 0823      Component Value Date/Time   CALCIUM  9.8 06/21/2024 0823   ALKPHOS 113 06/21/2024 0823   AST 27 06/21/2024 0823   ALT 52 (H) 06/21/2024 0823   BILITOT 0.4 06/21/2024 0823       Impression and Plan: Mr. Launer is a very pleasant 73 yo gentleman with 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   He finally got to [facility] and went through the CAR-T protocol.  He was out there for 40 days.  Again, he had quite a few toxicities from the Carvykti.  I really hope that we will be able to see that he is in remission.  I feel bad that he had all of these complications.  It is horribly that he was out there 40 days for this.  He is still is little bit unsteady.  He still uses the rolling walker.  I know that he is looking forward to hunting season at the end of September.  Hopefully he will be able to go hunting.  It will be very interesting to see how things go at  [facility] next week when he has the scans and the bone marrow biopsy.  We will plan to get him back to see us  probably in about a month or so just for follow-up.  At some point, we probably need to get him back on Zometa .     Bruce JONELLE Crease, MD 8/22/20259:06 AM

## 2024-06-22 LAB — IGG, IGA, IGM
IgA: <5 mg/dL — ABNORMAL LOW (ref 61–437)
IgG (Immunoglobin G), Serum: 624 mg/dL (ref 603–1613)
IgM (Immunoglobulin M), Srm: <5 mg/dL — ABNORMAL LOW (ref 15–143)

## 2024-06-23 ENCOUNTER — Other Ambulatory Visit: Payer: Self-pay

## 2024-06-24 ENCOUNTER — Other Ambulatory Visit: Payer: Self-pay | Admitting: *Deleted

## 2024-06-24 LAB — PROTEIN ELECTROPHORESIS, SERUM, WITH REFLEX
A/G Ratio: 1.4 (ref 0.7–1.7)
Albumin ELP: 3.5 g/dL (ref 2.9–4.4)
Alpha-1-Globulin: 0.3 g/dL (ref 0.0–0.4)
Alpha-2-Globulin: 0.8 g/dL (ref 0.4–1.0)
Beta Globulin: 0.8 g/dL (ref 0.7–1.3)
Gamma Globulin: 0.6 g/dL (ref 0.4–1.8)
Globulin, Total: 2.5 g/dL (ref 2.2–3.9)
Total Protein ELP: 6 g/dL (ref 6.0–8.5)

## 2024-06-24 LAB — KAPPA/LAMBDA LIGHT CHAINS
Kappa free light chain: <0.7 mg/L — ABNORMAL LOW (ref 3.3–19.4)
Kappa, lambda light chain ratio: UNDETERMINED
Lambda free light chains: <1.5 mg/L — ABNORMAL LOW (ref 5.7–26.3)

## 2024-06-24 MED ORDER — DULOXETINE HCL 60 MG PO CPEP: 60.0000 mg | ORAL_CAPSULE | Freq: Every day | ORAL | 1 refills | Status: DC

## 2024-06-24 NOTE — Progress Notes (Signed)
 ABMT OUTPATIENT NOTE: CAR-T THERAPY  IDENTIFYING STATEMENT:  Bruce Smith is a 73 y.o. male from [place], [place] with the diagnosis of Multiple Myeloma.  Patient's referring physician is Dr. Maude Crease.  Patient's primary ABMT physician is Dr. Darra.  He is status post high dose Melphalan + Autologous stem cell support on 01/22/21.  He is currently receiving Fludarabine/Cyclophosphamide lymph-depleting chemotherapy, to be followed by CAR-T [place]) on 03/27/2024.  Today is Day 37.   CAR-T course: His routine Car-T course involved receiving Fludarabine and Cytoxan as lympho-depleting chemotherapy followed by CAR-T infusion.  His course was complicated by balance alterations and AMS resulting in a fall prior to receiving CAR-T infusion. After workup Levaquin was deemed to be the cause and he was switched to Cefdinir with resolution.  On 6/5 he was admitted for Gr 1 CRS in the form of febrile neutropenia  He received Toci x 1.  During his stay he also experienced Gr2 ICANS with an ICE score of 6/10. He was noted to be lethargic with new confusion and decreased ICE score of 6, unable to write sentence, count backwards, or identify month/city.   He received one 10 mg dose of Decadron  and his symptoms resolved.  Counts remain low currently, but he is still transfusion independent.  He will have his hickman removed prior to discharge.  Follow up has been arranged with his primary team.  HPI:   -Reports mild fatigue, similar as compared to prior, as well as mild petechial rash of the distal bilateral lower extremities; otherwise, no new symptoms. Creatinine remains mildly elevated at 1.5. Transaminases remain mildly elevated; AST 44 and ALT 60.   INTERVAL HISTORY 05/01/24 Day 34 post CAR T: History of Present Illness - He experiences aching and throbbing pain in his legs and feet, described as 'tingly and needly.' This pain has persisted since his treatment for multiple myeloma, which included stem cell therapy,  chemotherapy, radiation, and CAR T-cell therapy. The leg pain is associated with muscle tightness, particularly when standing, and he describes his muscles as 'very rigid.' No recent muscle spasms, but he mentions a past phase of cramping. -He has tried medications such as Neurontin, gabapentin, and Cymbalta , which have provided some relief. Currently, he takes tramadol  four times a day, every six hours, and has been off Tylenol  for over thirty days following CAR T-cell therapy. Previously, he was taking two 500 mg doses of Tylenol  four times a day. -He reports low energy levels and occasional neck pain over the past few days. He experienced a single moderate headache recently, but does not usually suffer from headaches. No recent confusion or lethargy, although he had a previous neurologic complication. He is able to identify the current month and city correctly. His signature appears normal. No new tremor. - He had nausea yesterday that responded to taking Compazine . - No fever, cough, congestion, shortness of breath, chest pain, diarrhea, constipation, nausea, or vomiting. He also denies any other joint pain or back pain, noting that his pain is primarily in his legs.   INTERVAL HISTORY 05/07/24 Day 41 post CAR T: - He experienced a fall last Thursday in his closet, losing balance when the sliding door moved unexpectedly, resulting in a bruise on his right upper arm and a significant bruise on his back. No dizziness or lightheadedness was noted at the time of the fall. - He has aching, throbbing pains in his legs and feet, along with muscle stiffness. He takes Tylenol , tramadol , and tizanidine for pain  management, which helps alleviate the pain, though the muscle relaxant causes sleepiness. He manages the pain effectively at home with these medications. - He experiences occasional nausea, present about half the time, managed with Zofran . If Zofran  is ineffective, he uses Compazine , which is more effective  but causes dry mouth and increased tiredness. - He mentions a recent alert from his phone indicating an elevated heart rate, reaching up to 100 bpm once. He is not currently on metoprolol , which he previously took to regulate his heart rate. His current heart rate is around 88 bpm.  - He is on several medications, including Diflucan, Famvir , Bactrim DS, and Keppra , which was recently discontinued. He also takes magnesium  as needed. He has been instructed to restart cefdinir due to a low neutrophil count. - No headaches, congestion, cough, sore throat, fever, chills, shaking, tremors, or seizures. Energy levels are medium, allowing him to perform routine activities around the house. He walks short distances, such as from his house to the garage. His appetite is slightly off, with weight fluctuating from 199 lbs last week to 196 lbs this week, though he is not overly concerned about this change.  INTERVAL HISTORY 05/15/24 Day 48 post CAR T: - He experiences nausea primarily in the mornings, often after eating breakfast, which has led to vomiting. He has vomited twice since the last visit here last week, with the vomitus described as undigested food with a yellowish bile stain. The episodes occur suddenly without prior feelings of nausea. He takes Zofran  as needed for nausea, but not more than once a day, and finds it somewhat helpful. - He feels off balance and experiences dizziness, particularly when standing up. He uses a rolling walker for support and has noticed increased difficulty with balance over the past week. His legs feel weak and fatigue quickly, requiring him to sit down after standing for 15-20 minutes. He experiences numbness and tingling in his feet, particularly on the left side. He takes tizanidine, a muscle relaxant, which provides some relief but also causes drowsiness. Muscle aches are present in his thighs, more pronounced on the left side, and he has a history of needing to lift his left  leg more due to weakness. This symptom has worsened since CAR T-cell therapy. - No falls, cough, congestion, fevers, or chills. No significant changes in appetite or taste, and he eats regularly.  INTERVAL HISTORY 05/22/24 Day 55 post CAR T: - He experiences daily nausea, requiring Zofran  and Compazine  for relief. Despite taking Protonix , nausea persists but has not led to recent vomiting. He feels 'wiped out' and more tired than usual, with reduced energy levels. - He has balance issues and lightheadedness, with symptoms fluctuating daily. Some days he feels off balance, while on others he does not. He spends significant time in a recliner. - He reports muscle weakness and soreness primarily in his legs, thighs, and calves. His legs are sore to the touch, and he has difficulty standing and walking. He uses tramadol  and Tylenol  for pain relief, but they provide minimal relief. He also applies CBD oil, which helps more than Biofreeze. He has a history of falling and reports muscle weakness. 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 He has clearing petechiae on his ankles and feet. He has a history of a cracked scapula and bruising on his left shoulder from a fall a few weeks ago.  - No fevers, chills, night sweats, congestion, runny nose, sore throat, vomiting, dizziness,  vision changes, or significant headaches. He reports mild headaches that resolve on their own. He has an appetite, though not at full capacity.  INTERVAL HISTORY 05/29/24 Day 62 post CAR T: -His neutrophil count remains at 0.8, which is below the desired level of 1.0. This count has been stable over the past few weeks, with previous counts of 0.8 last week and 0.7 the week before. He is receiving weekly injections to help increase his neutrophil count. - He experiences weakness and soreness in his legs and feet, with neuropathy affecting both areas. He has difficulty standing up from a squat  position without assistance. He has recently started physical therapy, which has increased his leg pain. He fell on Saturday night, resulting in a major bruise on his right side and arm, with some skin peeling. He attributes the fall to unsteadiness and difficulty with balance, particularly when turning. - He experienced a single episode of severe nausea and vomiting yesterday, described as 'projectile' and involving green bile. He is currently taking Protonix  40 mg twice daily before meals to manage stomach acid. He reports feeling better after the vomiting episode and has not had recurrent nausea. - He continues to experience a stinging sensation due to neuropathy in his feet. No increased headaches, dizziness, vision changes, fevers, congestion, sore throat, or cough.  INTERVAL HISTORY 06/07/2024 - day 71 post CAR-T: -He experiences falls, with two incidents occurring recently, resulting in bruises but no cuts or head injuries. His legs and feet give way, particularly when turning. He is attending physical therapy but has not noticed significant improvement yet. He uses a walker but fell while trying to retrieve it. His wife assists him with daily activities. - He experiences persistent soreness in his legs and feet, with no significant change in pain levels. His left hip flexion is limited. He reports one instance of increased pain after physical therapy, which resolved after rest. - He experiences nausea without vomiting, which is managed with medication. He has diarrhea a couple of times a day but has not required regular use of Imodium . - He has a new puppy, a miniature Dachshund, which brings joy but requires careful handling to avoid scratches or bites due to his condition. His wife ensures hygiene by wiping the puppy's paws and using alcohol wipes if scratched. Is aware to wear gloves for protection.  - No lightheadedness, dizziness, cough, congestion, shortness of breath, confusion, or  forgetfulness beyond ordinary levels.  INTERVAL HISTORY 06/12/24:  -  He again fell this past weekend after bending over to pickup an item. He has extensive bruising behind his left elbow and his left shoulder posteriorly from falling backwards. - He is undergoing PT twice weekly. The other night he took 2 Tramadol  due to tenderness from his fall and after PT .  He feels he has suboptimal pain control as is experiencing ongoing soreness. -  He is still having some nausea that responds to medication. - Often feels  off balance with any change in position. - No cough, congestion , fever, diarrhea.  No headaches. INTERVAL HISTORY 06/26/24 DAY  90 post CAR T: - He has been experiencing elevated blood pressure for the past 2 weeks, with readings ranging from 162/61 to 185/95 mmHg. He has not yet contacted his cardiologist, as he was waiting for this appointment. He does not have any anti-HTN medications on hold. - His mobility has been declining, with significant difficulty in walking. He has been undergoing physical therapy, but his condition is  worsening. He now requires a transport chair for longer distances and struggles to lift his legs, particularly the left one. His family and caregiver have noticed these changes, and physical therapy is making him sore without noticeable improvement. - He reports shoulder and neck pain, particularly on the left side where he has previously fallen. The pain is described as throbbing and aching, similar to a toothache, and it is affecting his sleep. He has not fallen recently, but the pain persists and is worsening. -  His white blood cell count has decreased, with neutrophils dropping to 0.2 today. His hemoglobin is stable, and his platelet count has improved.  - No recent falls, but he reports shoulder and neck pain, difficulty lifting his legs, and worsening mobility.  - No changes in hand movements or tremors at rest. No noticeable change in mentation.  CURRENT  MEDICATIONS: Current Outpatient Medications  Medication Sig Dispense Refill  . calcium  carbonate-vitamin D3 (CALTRATE 600+D) 600 mg-5 mcg (200 unit) tablet Take 1 tablet by mouth 2 (two) times daily with meals    . DULoxetine  (CYMBALTA ) 60 MG DR capsule Take 1 capsule (60 mg total) by mouth once daily 30 capsule 3  . famciclovir  (FAMVIR ) 500 MG tablet Take 1 tablet (500 mg total) by mouth 2 (two) times daily 60 tablet 2  . loratadine (CLARITIN) 10 mg tablet Take 1 tablet (10 mg total) by mouth once daily    . magnesium  oxide (MAG-OX) 400 mg (241.3 mg magnesium ) tablet Take 400 mg by mouth once daily    . metoprolol  SUCCinate (TOPROL -XL) 25 MG XL tablet Take 1 tablet (25 mg total) by mouth once daily 30 tablet 3  . ondansetron  (ZOFRAN ) 8 MG tablet Take 1 tablet (8 mg total) by mouth every 8 (eight) hours as needed for Nausea or Vomiting    . pantoprazole  (PROTONIX ) 40 MG DR tablet Take 40 mg by mouth 2 (two) times daily before meals    . pentamidine (NEBUPENT) 300 mg solution for nebulization Take 6 mLs (300 mg total) by nebulization every 28 (twenty-eight) days    . polyethylene glycol (MIRALAX ) packet Take 17 g by mouth once daily Mix in 4-8ounces of fluid prior to taking.    . pregabalin  (LYRICA ) 100 MG capsule Take 1 capsule (100 mg total) by mouth 2 (two) times daily 60 capsule 2  . prochlorperazine  (COMPAZINE ) 5 MG tablet Take 1 tablet (5 mg total) by mouth every 6 (six) hours as needed for nausea or vomiting 40 tablet 2  . tamsulosin  (FLOMAX ) 0.4 mg capsule Take 1 capsule (0.4 mg total) by mouth once daily 30 capsule 2  . tiZANidine (ZANAFLEX) 2 MG capsule Take 1 capsule (2 mg total) by mouth 2 (two) times daily 60 capsule 0  . traMADoL  (ULTRAM ) 50 mg tablet Take 1 tablet (50 mg total) by mouth every 6 (six) hours as needed for pain 90 tablet 1  . [Paused] aspirin  81 MG EC tablet Take 81 mg by mouth once daily (Patient not taking: Reported on 06/26/2024)    . cefdinir (OMNICEF) 300 mg capsule  Take 1 capsule (300 mg total) by mouth every 12 (twelve) hours for 30 days    . fluconazole (DIFLUCAN) 200 MG tablet Take 2 tablets (400 mg total) by mouth once daily for 60 days. Begin with LD therapy and stop when ANC > 1000.    . naloxone  (NARCAN ) 4 mg/actuation nasal spray Place 4 mg into one nostril once (Patient not taking: Reported on 06/26/2024)    .  nitroGLYcerin  (NITROSTAT ) 0.4 MG SL tablet Place 0.4 mg under the tongue every 5 (five) minutes as needed for Chest pain May take up to 3 doses. (Patient not taking: Reported on 06/26/2024)     No current facility-administered medications for this visit.    ALLERGIES:  No Known Allergies  Allergies  Allergen Reactions  . Corticosteroids (Glucocorticoids) Other (See Comments)    This patient has or will receive CAR-T Cell Therapy, a type of cancer immunotherapy that uses a patient's own T cells to attack cancer cells and should only receive corticosteroids in specific clinical situations.  It is recommended that corticosteroids be avoided prior to cell infusion and at least 6 months after CAR-T Cell Therapy has been given.  If you have questions, please page the [phone]).   ROS:  Constitutional: No fevers. No rigors/chills.  No night sweats. no weight loss.    Eyes: No vision changes.  No dry eyes.  No eye pain.  Ears, nose, mouth, throat: No epistaxis.  No oral pain.  No oral lesions/erythema.  No odynophagia/dysphagia. Hematologic/lymphatic: No bruising or adenopathy. Cardiovascular: No chest pain.  No edema.  Gastrointestinal: No N/V.  Had diarrhea after PET/CT contrast.  No constipation. No blood in stool.  No hematemesis. Genitourinary: No dysuria.  No hematuria.  No urinary frequency/urgency.  Integumentary: No rash.  No skin lesions. CVC site, no drainage or tenderness Neurologic: + Focal weakness with left hip flexion. Increasing  weakness and difficulty straightening legs bilaterally. No numbness.  No tingling.   Often off balance. Psychiatric: No anxiety.  No depression. Endocrine: No diabetes.  No thyroid  disease. Respiratory: No cough.  No dypsnea.  No hemoptysis. Musculoskeletal: + Ongoing thigh muscle pain and left scapular pain.  Allergic/immunologic: See Allergy section. KPS: 70%  PHYSICAL EXAM: Vitals:   06/26/24 1203  BP: (!) 162/61  Pulse: 70  Resp: 14  Temp: 36.3 C (97.3 F)  TempSrc: Oral  SpO2: 100%  Weight: 86.8 kg (191 lb 5.8 oz)  Height: 167 cm (5' 5.75)  PainSc:   4  PainLoc: Generalized     Wt Readings from Last 3 Encounters:  06/26/24 86.8 kg (191 lb 5.8 oz)  06/12/24 86.1 kg (189 lb 13.1 oz)  06/07/24 88.1 kg (194 lb 3.6 oz)    General: Male.  Sitting in recliner, in no acute distress.  HEENT: [place]/AT. PERRL, EOMI, sclera anicteric.  Oral mucosa pink, moist.  No oral lesions.  Lungs: Clear to auscultation bilaterally.  Respirations regular. No audible W/R/R. Heart: Regular rate and regular rhythm. S1 and S2 present. No audible murmur, rub, or gallop.    Chest: Normal.   Abdomen: Active bowel sounds throughout. Soft, non distended, non tender. No palpable hepatosplenomegaly, or masses. Extremities: Slight firm edema at both ankles.  No cyanosis or edema in the upper extremities.  Skin: Warm, dry and intact.  Resolving bruising noted in the back of his right wrist and arm and right side with no palpable hematoma. Fading bruising on right thigh and side of right shin.  Neurologic: +  Reduced strength 1/5 with left hip flexion, 2/5 right hip flexion. 4/5 bilaterally with right and left leg extension. Alert and oriented x 3. Cranial nerves II -XII intact.  LABS: Results for orders placed or performed in visit on 06/26/24  Complete Blood Count (CBC) with Differential  Result Value Ref Range   WBC (White Blood Cell Count) 1.0 (L) 3.2 - 9.8 x10^9/L   Hemoglobin 9.9 (L) 13.7 -  17.3 g/dL   Hematocrit 71.3 (L) 60.9 - 49.0 %   Platelets 59 (L) 150 - 450 x10^9/L   MCV  (Mean Corpuscular Volume) 103 (H) 80 - 98 fL   MCH (Mean Corpuscular Hemoglobin) 35.5 (H) 26.5 - 34.0 pg   MCHC (Mean Corpuscular Hemoglobin Concentration) 34.6 31.5 - 36.3 %   RBC (Red Blood Cell Count) 2.79 (L) 4.37 - 5.74 x10^12/L   RDW-CV (Red Cell Distribution Width) 14.2 11.5 - 14.5 %   NRBC (Nucleated Red Blood Cell Count) 0.00 0 x10^9/L   NRBC % (Nucleated Red Blood Cell %) 0.0 %   MPV (Mean Platelet Volume) 11.9 (H) 7.2 - 11.7 fL   Neutrophil Count 0.2 (L) 2.0 - 8.6 x10^9/L   Neutrophil % 20.7 (L) 37 - 80 %   Lymphocyte Count 0.3 (L) 0.6 - 4.2 x10^9/L   Lymphocyte % 24.8 10 - 50 %   Monocyte Count 0.5 0 - 0.9 x10^9/L   Monocyte % 50.5 (H) 0 - 12 %   Eosinophil Count 0.02 0 - 0.70 x10^9/L   Eosinophil % 2.0 0 - 7 %   Basophil Count 0.01 0 - 0.20 x10^9/L   Basophil % 1.0 0 - 2 %   Slide Review/Morphology Yes    Immature Granulocyte Count 0.01 <=0.06 x10^9/L   Immature Granulocyte % 1.0 (H) <=0.7 %   Immature Platelet Fraction 7.3 1.6 - 8.5 %  Comprehensive Metabolic Panel (CMP)  Result Value Ref Range   Sodium 140 135 - 145 mmol/L   Potassium 3.9 3.5 - 5.0 mmol/L   Chloride 105 98 - 108 mmol/L   Carbon Dioxide (CO2) 27 21 - 30 mmol/L   Urea Nitrogen (BUN) 20 7 - 20 mg/dL   Creatinine 0.9 0.6 - 1.3 mg/dL   Glucose 889 70 - 859 mg/dL   Calcium  9.6 8.7 - 10.2 mg/dL   AST (Aspartate Aminotransferase) 25 15 - 41 U/L   ALT (Alanine Aminotransferase) 49 15 - 50 U/L   Bilirubin, Total 0.5 0.4 - 1.5 mg/dL   Alk Phos (Alkaline Phosphatase) 122 (H) 24 - 110 U/L   Albumin  3.8 3.5 - 4.8 g/dL   Protein, Total 6.3 6.2 - 8.1 g/dL   Anion Gap 8 3 - 12 mmol/L   BUN/CREA Ratio 22 6 - 27   Glomerular Filtration Rate (eGFR)  90 mL/min/1.73sq m  Lactate Dehydrogenase (LDH)  Result Value Ref Range   Lactate Dehydrogenase (LDH) 196 100 - 200 U/L  Magnesium   Result Value Ref Range   Magnesium  2.0 1.8 - 2.5 mg/dL  C-Reactive Protein (CRP), Inflammatory  Result Value Ref Range   CRP  (C-reactive Protein, Inflammatory) 0.92 (H) <=0.85 mg/dL  Ferritin  Result Value Ref Range   Ferritin 80 11 - 204 ng/mL  Sedimentation Rate-Automated  Result Value Ref Range   Sedimentation Rate-Automated 23 (H) <20 mm/hr  Disseminated Intravascular Coagulation (DIC) Screen  Result Value Ref Range   Prothrombin Time 10.9 9.5 - 13.1 sec   Prothrombin INR 1.0 0.9 - 1.1   Act Partial Thromboplastin Time 30.6 26.8 - 37.1 sec   Fibrinogen 339 213 - 435 mg/dL   D Dimer 8,599 (H) <499 ng/mL FEU   Narrative   PT/INR Reference Ranges: DVT/PE/PVD = INR 2.0 - 3.0 Mechanical Heart Valve = INR 2.5 - 3.5  NOTE: The INR is not a PT Ratio and is valid only for Coumadin patients  APTT Therapeutic Range (Heparin ) = 65-95 seconds Note: The therapeutic range for Heparin   has been determined using ex-vivo whole blood samples and therapeutic Heparin  level of 0.30-0.70 anti-factor Xa units.  Fibrinogen The direct thrombin  inhibitors (argatroban, bivalirudin, and lepirudin) have been reported to interfere with the clot-based fibrinogen assay used in the Coagulation Laboratory, resulting in decreased plasma fibrinogen levels. This occurs most prominently with argatroban, followed by bivalirudin, and then lepirudin.  D-Dimer For use in the exclusion of pulmonary embolism and venous thromboembolism  REFERENCE RANGE: < 500 ng/mL FEU CUT-OFF VALUE: < 500 ng/mL FEU = NEGATIVE >= 500 ng/mL FEU = POSITIVE  A negative D Dimer in a patient with low clinical likelihood to have a DVT and/or PE can be used to exclude the diagnosis of DVT and/or PE.   Type And Screen  Result Value Ref Range   ABO RH TYPE O Positive    Antibody Screen Negative    Specimen Outdate 06-29-2024 23:59    Performing Lab [facility]     TOXICITIES: - None currently  ABMT PROBLEM LIST: 1.  Kappa Light Chain Myeloma.             a.  Underwent cervical spine surgery via an anterior approach for cervical  spondylosis on 06/17/20.              bSABRA Rasmussen on 06/26/20 and injured his left shoulder and plain films revealed a comminuted mid scapular fracture as well as an erosive lesion in the 4th left rib and lucencies in the left proximal humerus.               c. CT of the head revealed lytic lesions in the skull; CT of the cervical spine revealed post op changes and lytic lesions in the T1 vertebral body; CT of the chest, abdomen and pelvis revealed an expansile lytic lesion in the left scapula with a fracture, a lytic lesion in the lateral left 4th rib with an associated soft tissue mass, multiple lytic lesions the right scapula, bilateral ribs and T11 vertebral body and numerous lytic lesions through out the lumbar spine and bony pelvis.               d. 06/29/20: no M-spike on SPEP; IgG 527 mg/dL, IgA 848 mg/dL and IgM 11 mg/dLl serum free kappa light chain level 15.47 mg/dL, lambda 9.30 mg/dL and ratio 77.57; LDH 849 U/L (98-192); hemoglobin 13.8 g/dL, creatinine 1.6 mg/dL, calcium  12.8 mg/dL; albumin  4.0 g/dL; received Xgeva .             e. PET/CT scan on 07/07/20 revealed multifocal hypermetabolic lytic bone lesions in the spine, ribs, humeri, both femurs including a large destructive lesion in the left proximal femur at risk for pathologic fracture and all areas of the pelvis.               f. 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 Underwent open reduction and internal fixation of the left femur lesion on 07/14/20 and pathology revealed a plasma cell neoplasm.               h. Bone marrow on 07/28/20 revealed 30-40% plasma cells and a small clonal B-cell population.              i. Treated with radiation therapy to his left shoulder and ribs, upper sacrum and left femur.  j. Started on dara-RVd on 08/11/20; serum free kappa light chain level 7.29 mg/dL, lambda 9.52 mg/dL and ratio 84.48.                k. Course was complicated by nausea, urinary retention and severe constipation.               l. 10/06/20: serum free kappa light chain level 0.98 mg/dL, lambda 9.52 mg/dL and ratio 7.90.              m. Revlimid  was stopped on 10/13/20.             n. PET/CT scan on 10/16/20 revealed significant interval improvement with no new lesions.               o. Bone marrow on 10/19/20 was hypocellular with no excess plasma cells.               p. 11/03/20: serum  free kappa light chain level 0.82 mg/dL, lambda 9.70 mg/dL and ratio 7.16.              r. Fell on 11/16/20 and plain films revealed a lytic lesion in the proximal left fibula with a fracture along the medial aspect of the lesion.             s. 11/18/20: 0.08 g/dL M-spike; faint IgG kappa on IFE; serum free kappa light chain level 0.64 mg/dL, lambda 9.76 mg/dL and ratio 7.21.             t. 12/02/20: bone marrow was 5-10% cellular with no plasma cells and 3% monotypic B cells on flow;  limited FISH panel was negative for 17P and 11q abnormalities; cytogenetics were normal; echocardiogram revealed LVEF >55% with normal wall motion and GLS -14.8%; ECG revealed NSR with QTc 396 msec; PFTs revealed FVC 98%, FEV1 90% and DLCO 56.8%; chest x-ray revealed a consolidative opacity in the left lung apex with architectural distortion; left tib-fib films revealed a lucent lesion in the proximal left fibula with a nondisplaced fracture with evidence of healing.                u. Started G-CSF on 12/12/20 and had a Hickman catheter placed on 12/15/20 and received Mozobil that evening; underwent apheresis on 12/16/20 with collection of 3.62 x 10e6 CD34 cells/kg; received Mozobil again that evening and underwent apheresis on 12/17/20 with collection of 1.89 x 10e6 CD34 cells/kg.               v. Chest CT on 12/17/20 revealed a left apical bandlike consolidative opacity and traction bronchiectasis and multifocal osseous lesions.             w. 12/21/20: no M-spike on  SPEP; faint IgG kappa on IFE; serum free kappa light chain level 0.42 mg/dL, lambda 9.80 mg/dL and ratio 7.78. x. 96/77/77: no M-spike on SPEP; faint IgG kappa on IFE; serum free kappa light chain level 0.47 mg/dL, lambda  9.76 mg/dL and ratio 7.95.             y. High dose melphalan 200 mg/m2 followed by autologous stem cell infusion on 01/22/21.             z. 04/05/21: 0.02 g/dL M-spike; faint IgG kappa on IFE; IgG 225 mg/dL; serum free kappa light chain level 0.35 mg/dL, lambda 9.72 mg/dL and ratio 8.69.               aa. 05/13/21: started daratumumab   maintenance.             ab. 05/17/21: bone marrow normocellular with no plasma cells and normal cytogenetics.             ac. 06/11/21: <0.1 g/dL M-spike; IgG kappa on IFE; IgG 255 mg/dL; serum free kappa light chain level 0.42 mg/dL, lambda 9.80 mg/dL and ratio 7.78.              ad. 07/07/21: IgG 220 mg/dL; serum free kappa light chain level 0.37 mg/dL, lambda 9.77 mg/dL and ratio 8.31.             ae. 08/19/2020: No M-spike; serum free kappa light chain level .49, lambda .21, and ratio 2.3.             af. 09/16/2021: No M-spike; IgG level 250mg /dL serum free kappa light chain level .43, lambda .21, and ratio 2.05.             ag. 10/26/2021: No M-spike; IgG level 330 mg/dL; serum free kappa light chain level 0.79, lambda 0.45, and ratio 1.67.             ah. 11/24/2021:  No M-Spike; IFE revealed IgG monoclonal protein with kappa light chain specificity; IgG level 365 mg/dL; serum free kappa light chain level .67, lambda .62, and ratio 1.08.             ai. 11/26/2021: No M-Spike; UPEP was unremarkable. Evidence of monoclonal protein was not apparent.             aj. 12/22/2021: No M-Spike; IgG level 303 mg/dL; serum free kappa light chain level .55, lambda .39, and ratio 1.41             ak. 01/05/2022: No M-Spike; IgG kappa on IFE; IgG level 288 mg/dL; serum free kappa light chain level 0.64, lambda .65, and ratio 0.98.             al. 01/14/22: MRI  Pelvis/Left femur/lumbar spine: Overall, the numerous pelvis lesions appear decreased in size from prior. Some small prior bilateral iliac lesions are no longer visualized. There is interval decrease in enhancement of the lesions with areas of central non enhancement now suggesting necrosis. Status post ORIF of the proximal left femur. The prior bone lesion and associated edema from pathologic fracture within the distal left femoral neck and superior left greater trochanter is overall smaller, with interval decrease in the marrow edema suggesting interval healing of the fracture. Edema deep to the left-greater-than-right iliacus muscles appears similar to prior. There is again fluid extending into the right iliopsoas bursa from the anterior superior right femoroacetabular joint fluid, measuring up to 2.6 cm similar to prior. No new compression fracture. There is increased canal and left foraminal stenosis at L2-L3.             am. 07/13/22: No M-Spike; IgG kappa on IFE; IgG level 273 mg/dL; serum free kappa light chain level 0.65 mg/dL, lambda 9.63 mg/dL and ratio 8.18.             an. 08/03/22: serum free kappa light chain level 0.54 mg/dL, lambda 9.66 mg/dL and ratio 8.35. ao. PET scan on 08/18/22 revealed a new areas of hypermetabolism at T12, the left iliac bone and the left mid humerus.   ap. Treated with 2500 cGy to the left humerus, T spine and left pelvis from 11/20-12/05/23.  aq. 11/29/22: serum free kappa light chain level 0.45 mg/dL, lambda 9.68 mg/dL and ratio 8.54.  ar. PET scan on 12/12/22 revealed a new hypermetabolic lytic lesion in the right femoral head. as. 12/27/22: serum free kappa light chain level 0.49 mg/dL, lambda 9.66 mg/dL and ratio 8.51. at. 96/98/75: 24 hour urine contained 88 mg of protein (100% albumin ) with no M-spike on UPEP or IFE; free kappa light chain level 2.03 mg/L, lambda <0.69 mg/L and ratio >2.94. au. Treated with 2400 cGy to the right pelvis from 03/07-03/18/24. av.  01/23/23: 0.04 g/dL M-spike; IgG kappa on IFE; serum free kappa light chain level 0.49 mg/dL, lambda 9.69 mg/dL and ratio 8.36. aw. 93/80/75: 0.1 g/dL M-spike; IgG kappa on IFE; IgG 312 mg/dL; serum free kappa light chain level 0.88 mg/dL, lambda 9.26 mg/dL and ratio 8.78.   ax. PET/CT on 05/09/23 revealed a new area of focal hypermetabolism along the anterior aspect of T9 with mild corresponding lucencies on CT, resolution of previously noted uptake in the right femoral head and scattered lytic and sclerotic bone lesions with no uptake. ay. 05/16/23: no M-spike on SPEP or IFE; serum free kappa light chain level 0.82 mg/dL, lambda 9.39 mg/dL and ratio 8.62. az. 91/94/75: bone marrow revealed no excess plasma cells with a CD5/CD19 positive B cell population on flow.   ba. Treated with 2400 cGy to his thoracic spine from 08/06-08/19/24. bb. PET/CT scan on 09/26/23 revealed new hypermetabolic lesions at C6, L5, the proximal left femoral diaphysis and the right paraspinal region and a hypermetabolic left hilar lymph node with no pulmonary lesions. bc. Started on carfilzomib , pomalidomide  and dexamethasone  on 10/19/23. bd. 10/23/23: no M-spike on SPEP or IFE; serum free kappa light chain level 1.88 mg/dL, lambda 9.66 mg/dL and ratio 4.29. be. 98/69/74: serum free kappa light chain level 1.26 mg/dL, lambda 9.62 mg/dL and ratio 6.58. bf. 97/72/74: underwent apheresis with subsequent successful manufacture of Carykti carT cells. bg. 01/01/24: serum free kappa light chain level 1.78 mg/dL, lambda 9.36 mg/dL and ratio 7.16. bh. 95/97/74: serum free kappa light chain level 2.45 mg/dL, lambda 9.08 mg/dL and ratio 7.30; received IVIG that day. bi. PET/CT scan on 02/01/24 (compared to 09/25/24) revealed an increase in the size and activity of a hypermetabolic prevascular/AP window mass up to 4.1 x 3.2 cm (1.9 x 1.8 cm previously) with SUV 55, a new 0.7 cm LUL nodule with SUV 3.3, a new 0.7 cm RLL nodule and subtle  soft tissue between the spine and the right psoas muscle with stable SUV 11, a new ventral peritoneal nodule with SUV 13.8, a lytic lesion in L5 with SUV 16 (7.1 previously), increased activity in the right pedicle of L5 and cortical activity along the shaft of the left femoral prosthesis with SUV 49 (17.5 previously).  bj. Bone marrow on 02/02/24 was insufficient for diagnosis.  bk. Last dose of carfilzomib  on 01/15/24; started back on pomalidomide  on 01/30/24.   bl. 02/12/24: no M-spike on SPEP; faint IgG kappa on IFE; IgG 851 mg/dL; serum free kappa light chain level 4.40 mg/dL, lambda 8.32 mg/dL and ratio 7.36. bm. 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 Bn. PET/CT on 06/26/24 : IMPRESSION: 1.  Interval complete resolution of focal bony lesions with no new hypermetabolic bony lesions.  2.  No evidence of extramedullary disease with resolved extramedullary disease above and below the diaphragm.  3.  Interval resolution of bilateral pulmonary infection/inflammatory changes. 4.  Increased uptake about the distal femoral hardware, which may be suggestive of post-treatment inflammatory change versus loosening. Recommend attention on follow-up.  2. History of CAD with MI (Stents x 2).             a. Admitted to the hospital on 03/08/23 with a myocardial infarction and underwent a three vessel CABG on 03/13/23.     3. BPH             a. Started Flomax  in early 02/22  DISPOSITION: DISEASE: Kappa Light Chain Myeloma - High dose Melphalan 200 mg/m2 followed by autologous stem cell infusion on 01/22/21. - He is currently receiving Fludarabine/Cyclophosphamide lymph-depleting chemotherapy, to be followed by CAR-T [place]) on 03/27/2024.   CELLULAR THERAPY: Preparative regimen:   - Fludarabine 30 mg/m (65 mg) IV: Day -5 through Day -2. - Cyclophosphamide 300 mg/m (650 mg) IV: Day -5 through Day -2.  Stem Cells:   - CAR-T cell reinfusion on 03/28/24.  Was  scheduled for 5/28, but patient had fall and AMS.  Supportive Care: - Transfuse prn hgb <=8gm/dl and plts <=89X. No transfusions today.  - 6/27: mild petechiae over anterior aspect of distal lower extremities bilaterally, present x4-5 days per pt. Perhaps mild progression over past few days. Plts starting to uptrend; optimistic for improvement as plts increase.    - 05/08/24 stable petechiae. Plts stable at 35.  - 05/22/2024 clearing petechiae. Platelets stable at 37.  -  Outside labs 06/21/24 WBC 2.2, Hgb 10.0, plt ct 54, ANC 1.1.              -  06/26/24 labs: WBC 1.0, Hgb 9.9, Plt ct 59, ANC 0.2.  CAR-T Toxicities (date/grade): - Grade 1 CRS; Toci x 1 - Grade 2 ICANS; Dex 10 mg x 1 for ICE 6/10  INFECTIOUS DISEASE Prophylaxis:  - Bacterial: Cefdinir 300 mg po q 12 hrs. continue through ANC >1000. (levaquin discontinued as possible cause of AMS). If ANC >1000  could discontinue. D/c on 06/12/24. Resumed on 06/27/24 for ANC 0.2. - Viral: Famciclovir  500mg  po BID will continue for 6 months s/p CAR-T treatment (can then d/c if CD4 >200). - Fungal: Fluconazole 400mg  daily will continue through ANC >1000.  If ANC >1000 could discontinue.  D/C on 06/12/24. Resumed on 06/27/24 for ANC 0.2. - PCP: Bactrim DS 1 tab po Mon/Wed/Fri will continue for 6 months s/p CAR-T treatment (can then d/c if CD4 >200).  On 05/08/2024 discontinued Bactrim with concern for ongoing pancytopenia.  Same day initiated pentamadine treatments to continue every 28 days. Given 06/07/2024.  Next due on 07/05/24.  Febrile neutropenia: 6/5: Admitted with fever 102.2. Was on Vanc for SSTI at CVC site, cefepime  initiated. FFWU negative and remained afebrile therefore transitioned back to cefdinir and vanc d/c'd. Toci x 1 given for likely CRS  Weekly Monitoring: CMV: 04/08/24 - 06/12/24: negative  CARDIAC:  CAD (Hx of CABG/Stents): - Current Regimen: Toprol -XL 25 mg daily + ASA 81 mg daily. ON HOLD with decreased BP and plts <50K.  Could  restart ASA if plts remain above 50K.   Angina: - Current Regimen: Nitro 0.4 mg every 5 minutes PRN  GI: N/V Prophylaxis:  - Patient currently has Compazine  and Zofran  as needed for breakthrough symptoms.  - 6/9: IV Zofran  given in clinic x1 on 04/08/24.  Will allow for Ativan  oral to be given at night. - 6/10: nausea improved.   GERD: - Current Regimen: Protonix  40 mg 2x daily.  Probiotic Supplementation: - Current Regimen: None.  Patient will hold Florastor during acute CAR-T process.  GU:  BPH:  - Current Regimen: Flomax   0.4 mg daily--holding currently--restarted 04/14/24  Cr elevation: - 6/27: Cr 1.5 today; baseline pre-transplant 1.2. Cr has been 1.4-1.7 since 6/5. No decreased PO to suggest pre-renal physiology 6/27. Reported possible mild urinary obstructive sxs occurring ~6/20-6/24; bladder scan 6/27 0 cc (measured shortly after pt voided). 6/27 UA normal. As such, Cr elevation possibly attributable to bactrim.  - Consider transition from bactrim to atovaquone if Cr rises in future. Currently stable at 1.5. - Bactrim discontinued due to concern for ongoing pancytopenia.  Creatinine improved to 1.4. - Creatinine now further improved at 1.3 - 1.0.  CRS MONITORING: - Patient underwent a ROS as above. - Patient's vital signs were monitored, including BP and O2 sats.   NEURO: Peripheral Neuropathy: - Current Regimen: Lyrica  200 mg 2x daily + Cymbalta  60 mg daily  AMS, Hallucinations - Onset 5/27 after LD chemo prior to CAR-T cells. Head CT 5/28 NAICP. MRI 5/28 ordered, cancelled on 5/29 given improvement in AMS - 6/5: lethargy and confusion on admission, ICE 6/10, unable to write sentence, count backwards or identify month/city. Dex 10 mg x 1 given for gd 2 ICANS with improvement to 10/10.    Weakness, Balance Deficits Fall - 5/27: s/p fall in shower.  - 5/28:  Head CT 5/28 NAICP. Infectious w/u 5/28 including BCx x2 NGTD.   Seizure Prophylaxis: - Patient will continue on  Keppra  500mg  po Q12hrs through Day +30 (can then d/c if no seizure activity).  Dc'd 6/27 (D+29 PM dose; day of [facility] from [facility])  Neurotoxicity Monitoring: -Having increasing lower extremity muscle stiffness  MSK:  Generalized Pain: - Current Regimen: Tramadol  50 mg 4x daily PRN.  Added Tylenol  500 mg up to twice daily and tizanidine muscle relaxant 2 mg once daily with interval improvement. - Increased tizanidine to twice daily.  Patient aware that this increases fatigue. - For left hip flexion weakness, referring him for PT local to his residence. - Still with myalgias and muscle stiffness in legs.  Continuing Tramadol .  Started PT on 7/29.  Will continue next week 2 times per week. - Worsening leg weakness and stiffness as of 06/26/24.  Requesting Neuro consult,  MRI brain, MRI total spine. ALC has remained low. May need LP dependent on MRI findings. PET/CT noting loosening/ inflamed left distal femoral hardware.   Hypomagnesemia: - Current Regimen: Magnesium  oxide 400 mg daily with food.  Hypocalcemia: - Current Regimen: Calcium  + Vitamin D3: 1 tablet 2x daily with food.  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 -  05/01/24 IgG 417. -  05/29/24 IgG 300. - 06/12/24  IVIG administered.  FOLLOW-UP: - Patient today at day 90, ANC fell from 1.1 to 0.2 in 1 week. Releuko  today.  Will needs labs followed weekly -  For progressing  left hip flexion weakness and bilateral leg weakness and progressing stiffness needs further neurological evaluation. Requesting Neuro consult,  MRI brain, MRI total spine. ALC has remained low. May need LP dependent on MRI findings. PET/CT noting loosening/ inflamed left distal femoral hardware. Recommended they reach out to Orthopedic office regarding this. - ANC fallen from 1.3 two weeks ago and 1.1 one week ago to 0.2 on 06/26/24.  G-CSF 480 mcg today. Instructed to resume prophy with Cefdinir and Diflucan. - Advised if able to do CBC and CMP with local  oncology office next week. Or will arrange appointment here. -   Will continue pentamidine nebulizer therapy every 28 days for PJP prophy. Treatment last administered on 06/07/24. Due next on 07/12/24. -  IVIG administered on 06/12/24 for IgG level below 400. - Day 90 IR BMBX with MRD with ClonoSeq completed today. Restaging PET/CT noting Interval complete resolution of focal bony lesions with no new hypermetabolic bony lesions c/w disease response. -- In 2 weeks initial clinic visit with Dr. Cynthea Purpura as Dr. Darra has retired. -- Patient was instructed to call if any questions, concerns, AMS changes, or temperature of 100.13F or greater when not in ABMT clinic.  Plan of care reviewed with and formulated by Dr. Purpura.  I spent a total of 45 minutes in both face-to-face and non-face-to-face activities, excluding procedures performed, for this visit on the date of this encounter.    Future Appointments  Date Time Provider Department Center  07/12/2024  8:00 AM STUDIES/LAB-BCC [facility]  07/12/2024  9:00 AM Purpura Cynthea, MD [facility]  07/12/2024 10:00 AM BMT-CLINIC NURSE [facility]  02/05/2025  1:00 PM Ona Pfeiffer, MD [facility] [place]     CONTACT INFO: Patient's cell: [phone] (M)    Attestation Statement:   I personally performed the service, non-incident to. (WP)   TANYA ALANA NOVEL, PA

## 2024-06-25 ENCOUNTER — Other Ambulatory Visit: Payer: Self-pay

## 2024-06-25 ENCOUNTER — Telehealth: Payer: Self-pay | Admitting: Dietician

## 2024-06-25 NOTE — Telephone Encounter (Signed)
 Patient screened on MST. First attempt to reach, cell and home# belong to spouse. Provided my cell# on voice mail to return call to set up a nutrition consult.  Micheline Craven, RDN, LDN Registered Dietitian, [facility] Part Time Remote (Usual office hours: Tuesday-Thursday) Cell: [phone]

## 2024-06-27 ENCOUNTER — Encounter: Payer: Self-pay | Admitting: Hematology & Oncology

## 2024-07-02 NOTE — Progress Notes (Signed)
 Return Office Visit  07/02/2024  Visit Problem List:  1. Coronary artery disease, unspecified vessel or lesion type, unspecified whether angina present, unspecified whether native or transplanted heart  cloNIDine (CATAPRES) 0.1 mg tablet    2. H/O heart artery stent  cloNIDine (CATAPRES) 0.1 mg tablet    3. Multiple myeloma, remission status unspecified (*)  cloNIDine (CATAPRES) 0.1 mg tablet    4. Elevated blood pressure reading  Echocardiogram Complete WO Enhancing Agent   cloNIDine (CATAPRES) 0.1 mg tablet    5. Essential (primary) hypertension  Echocardiogram Complete WO Enhancing Agent        Chronic Problem List:  Patient Active Problem List  Diagnosis  . Abnormal glucose  . Abnormal neurological exam  . Angina pectoris  . Atherosclerotic heart disease of native coronary artery without angina pectoris     Medications:  Current Outpatient Medications:  .  acetaminophen  (TYLENOL ) 500 mg tablet, Take one tablet (500 mg dose) by mouth every 6 (six) hours., Disp: , Rfl:  .  aspirin  81 mg chewable tablet, Chew one tablet (81 mg dose) by mouth. (Patient not taking: Reported on 07/02/2024), Disp: , Rfl:  .  benzonatate  (TESSALON ) 200 MG capsule, Take one capsule (200 mg dose) by mouth. (Patient not taking: Reported on 07/02/2024), Disp: , Rfl:  .  calcium  carbonate 1250 mg tablet, Take by mouth 2 (two) times a day with meals., Disp: , Rfl:  .  cefdinir (OMNICEF) 300 mg capsule, Take one capsule (300 mg dose) by mouth every 12 (twelve) hours., Disp: , Rfl:  .  celecoxib  (CELEBREX ) 100 mg capsule, , Disp: , Rfl:  .  cloNIDine (CATAPRES) 0.1 mg tablet, Take one tablet (0.1 mg dose) by mouth daily as needed. For systolic BP > 160, Disp: 30 tablet, Rfl: 5 .  DULoxetine  HCl (CYMBALTA ) 60 mg capsule, Take one capsule (60 mg dose) by mouth daily., Disp: , Rfl:  .  famciclovir  (FAMVIR ) 250 mg tablet, Take one tablet (250 mg dose)  by mouth daily., Disp: , Rfl:  .  fluconazole (DIFLUCAN) 200 mg tablet, Take two tablets (400 mg dose) by mouth daily., Disp: , Rfl:  .  furosemide  (LASIX ) 20 mg tablet, Take one tablet (20 mg dose) by mouth daily as needed. (Patient not taking: Reported on 07/02/2024), Disp: , Rfl:  .  ipratropium-albuterol  (COMBIVENT RESPIMAT) 20-100 MCG/ACT inhaler, Inhale one puff into the lungs every 6 (six) hours as needed., Disp: , Rfl:  .  levETIRAcetam  (KEPPRA ) 500 mg tablet, Take one tablet (500 mg dose) by mouth 2 (two) times daily., Disp: , Rfl:  .  loratadine (CLARITIN) 10 MG tablet, Take one tablet (10 mg dose) by mouth daily., Disp: , Rfl:  .  Magnesium  250 MG CAPS, Take 250 mg by mouth daily., Disp: , Rfl:  .  magnesium  oxide 140 mg capsule, Take 250 mg by mouth daily., Disp: , Rfl:  .  metoprolol  succinate (TOPROL -XL) 50 mg 24 hr tablet, Take one tablet (50 mg dose) by mouth daily., Disp: 30 tablet, Rfl: 5 .  nitroGLYCERIN  (NITROSTAT ) 0.4 mg SL tablet, Place one tablet (0.4 mg dose) under the tongue every 5 (five) minutes as needed for Chest pain., Disp: 21 tablet, Rfl: 6 .  ondansetron  (ZOFRAN ) 8 MG tablet, TAKE 1 TABLET BY MOUTH EVERY 8 HOURS AS NEEDED NAUSEA AND VOMITING, Disp: , Rfl:  .  pantoprazole  sodium (PROTONIX ) 40 mg tablet, TAKE 1 TABLET BY MOUTH 2 TIMES A DAY BEFORE A MEAL, Disp: , Rfl:  .  polyethylene glycol (MIRALAX ,GAVILAX,CLEARLAX) 17 GM/SCOOP powder, Take seventeen g by mouth daily., Disp: , Rfl:  .  pregabalin  (LYRICA ) 100 mg capsule, Take one capsule (100 mg dose) by mouth 2 (two) times daily., Disp: , Rfl:  .  prochlorperazine  (COMPAZINE ) 10 MG tablet, Take one tablet (10 mg dose) by mouth every 6 (six) hours as needed., Disp: , Rfl:  .  rosuvastatin  calcium  (CRESTOR ) 40 mg tablet, Take one tablet (40 mg dose) by mouth daily. (Patient not taking: Reported on 07/02/2024), Disp: , Rfl:  .  tamsulosin  (FLOMAX ) 0.4 mg CAPS, Take one capsule (0.4 mg dose) by mouth at bedtime., Disp: ,  Rfl:  .  tizanidine (ZANAFLEX) 2 MG capsule, Take one capsule (2 mg dose) by mouth 2 (two) times daily. (Patient not taking: Reported on 07/02/2024), Disp: , Rfl:  .  traMADol  (ULTRAM ) 50 mg tablet, TAKE TWO TABLETS BY MOUTH EVERY 6 HOURS AS NEEDED FOR PAIN, Disp: , Rfl:   Interval History: Beaumont Austad returns to clinic today for unscheduled follow up. He has concerns about a 2 week history of elevated blood pressure readings at home with 180s/100s being the highest. It has been about 2 weeks of this with no known trigger. He does have a lot of stress with treatments and imaging for his multiple myeloma but nothing new at this time. He is followed here regarding history of CAD s/p stenting and previous CABG. He follows with [facility] who recommended evaluation of the elevated readings. For the last couple of days, he has been getting more normal readings with 130s systolic. Regarding cardiac symptoms, patient denies chest pain or significant shortness of breath. Discussed the concerns for fall risk if he over-treated the blood pressure so could consider an as needed medication like clonidine for systolic BP over 839. Will have him come back for a nurse visit BP check in a couple of weeks and get an echo as well to check for possible causes. He has had no symptoms of ischemic but advised both he and his wife be aware.   ROS General - weight stable, appetite normal; no fever HEENT- vision stable, hearing normal; no difficulty swallowing Endocrine - thyroid  stable Resp - denies cough, hemoptysis, purulent sputum Abdomen - denies belly pain, swelling; denies liver disease or jaundice GI - no hematemesis, melena. Normal bowel movements. No Crohns/UC history GU - no dysuria, hematuria, pyuria. No UTI or kidney stone. Neuro - no slurred speech, blurred vision; denies amaurosis or sided weakness Skin - no rash, bruising, petechia Hematologic - no recent bleeding or easy bruising Allergy - denies  food allergy; drug allergies as noted Psych - No depression, normal sleep  Physical Examination Vitals:   07/02/24 0954  BP: 130/78  Pulse: 76  SpO2: 98%  Weight: 195 lb (88.5 kg)  Height: 5' 8 (1.727 m)   General - Pt. in NAD, normal demeanor, speech normal, oriented normally HEENT - PERRLA, EOMs intact, nares and throat clear Neck -  neither supple nor no adenopathy nor thyroid  normal in size, no nodules or tenderness nor carotids normal upstroke, no bruits Lungs - chest clear, no wheezing, rales, normal symmetric air entry, no chest wall deformities or tenderness Heart - regular rate and rhythm, S1, S2 normal, no murmur, click, rub or gallop Abdomen - soft, bowel sounds positive, no organomegaly Extrem - none Skin - no rash, bruising or petechia Back - no CVA tenderness Neuro - strength is symmetric in upper and lower extremities; reflexes normal,  speech normal, face symmetric Psych - affect appropriate  Assessment :   1. Coronary artery disease, unspecified vessel or lesion type, unspecified whether angina present, unspecified whether native or transplanted heart (Primary) -     cloNIDine (CATAPRES) 0.1 mg tablet; Take one tablet (0.1 mg dose) by mouth daily as needed. For systolic BP > 160, Starting Tue 07/02/2024, Normal 2. H/O heart artery stent -     cloNIDine (CATAPRES) 0.1 mg tablet; Take one tablet (0.1 mg dose) by mouth daily as needed. For systolic BP > 160, Starting Tue 07/02/2024, Normal 3. Multiple myeloma, remission status unspecified (*) -     cloNIDine (CATAPRES) 0.1 mg tablet; Take one tablet (0.1 mg dose) by mouth daily as needed. For systolic BP > 160, Starting Tue 07/02/2024, Normal 4. Elevated blood pressure reading -     Echocardiogram Complete WO Enhancing Agent; Future -     cloNIDine (CATAPRES) 0.1 mg tablet; Take one tablet (0.1 mg dose) by mouth daily as needed. For systolic BP > 160, Starting Tue 07/02/2024, Normal 5. Essential (primary) hypertension -      Echocardiogram Complete WO Enhancing Agent; Future    Plan:   Clonidine prn for systolic BP > 160 Other medications the same Echocardiogram to assess structure and function of heart due to sudden change in average BP Patient was advised to visit ED or contact this clinic should any significant or worsening cardiac symptoms occur before next visit.    Follow up visit:  3 months or sooner as needed   Darliss Raymond, PA-C [facility]

## 2024-07-03 ENCOUNTER — Other Ambulatory Visit: Payer: Self-pay | Admitting: Hematology & Oncology

## 2024-07-03 ENCOUNTER — Inpatient Hospital Stay

## 2024-07-03 ENCOUNTER — Ambulatory Visit (HOSPITAL_BASED_OUTPATIENT_CLINIC_OR_DEPARTMENT_OTHER)
Admission: RE | Admit: 2024-07-03 | Discharge: 2024-07-03 | Disposition: A | Discharge status: Home / Self Care | Source: Ambulatory Visit | Attending: Hematology & Oncology | Admitting: Hematology & Oncology

## 2024-07-03 ENCOUNTER — Inpatient Hospital Stay: Attending: Hematology & Oncology

## 2024-07-03 ENCOUNTER — Inpatient Hospital Stay (HOSPITAL_BASED_OUTPATIENT_CLINIC_OR_DEPARTMENT_OTHER): Admitting: Hematology & Oncology

## 2024-07-03 ENCOUNTER — Inpatient Hospital Stay: Admitting: Dietician

## 2024-07-03 ENCOUNTER — Encounter: Payer: Self-pay | Admitting: Hematology & Oncology

## 2024-07-03 ENCOUNTER — Ambulatory Visit: Payer: Self-pay | Admitting: Hematology & Oncology

## 2024-07-03 VITALS — BP 136/63 | HR 73 | Temp 98.1°F | Resp 20 | Ht 68.0 in | Wt 189.4 lb

## 2024-07-03 DIAGNOSIS — M25512 Pain in left shoulder: Secondary | ICD-10-CM | POA: Insufficient documentation

## 2024-07-03 DIAGNOSIS — C9001 Multiple myeloma in remission: Secondary | ICD-10-CM | POA: Insufficient documentation

## 2024-07-03 DIAGNOSIS — Z9484 Stem cells transplant status: Secondary | ICD-10-CM | POA: Insufficient documentation

## 2024-07-03 DIAGNOSIS — C419 Malignant neoplasm of bone and articular cartilage, unspecified: Secondary | ICD-10-CM

## 2024-07-03 DIAGNOSIS — Z923 Personal history of irradiation: Secondary | ICD-10-CM | POA: Diagnosis not present

## 2024-07-03 LAB — CBC WITH DIFFERENTIAL (CANCER CENTER ONLY)
Abs Immature Granulocytes: 0.05 K/uL (ref 0.00–0.07)
Basophils Absolute: 0 K/uL (ref 0.0–0.1)
Basophils Relative: 1 %
Eosinophils Absolute: 0 K/uL (ref 0.0–0.5)
Eosinophils Relative: 1 %
HCT: 28 % — ABNORMAL LOW (ref 39.0–52.0)
Hemoglobin: 9.5 g/dL — ABNORMAL LOW (ref 13.0–17.0)
Immature Granulocytes: 2 %
Lymphocytes Relative: 13 %
Lymphs Abs: 0.3 K/uL — ABNORMAL LOW (ref 0.7–4.0)
MCH: 35.4 pg — ABNORMAL HIGH (ref 26.0–34.0)
MCHC: 33.9 g/dL (ref 30.0–36.0)
MCV: 104.5 fL — ABNORMAL HIGH (ref 80.0–100.0)
Monocytes Absolute: 0.6 K/uL (ref 0.1–1.0)
Monocytes Relative: 29 %
Neutro Abs: 1.2 K/uL — ABNORMAL LOW (ref 1.7–7.7)
Neutrophils Relative %: 54 %
Platelet Count: 63 K/uL — ABNORMAL LOW (ref 150–400)
RBC: 2.68 MIL/uL — ABNORMAL LOW (ref 4.22–5.81)
RDW: 13.8 % (ref 11.5–15.5)
WBC Count: 2.2 K/uL — ABNORMAL LOW (ref 4.0–10.5)
nRBC: 0 % (ref 0.0–0.2)

## 2024-07-03 LAB — CMP (CANCER CENTER ONLY)
ALT: 42 U/L (ref 0–44)
AST: 36 U/L (ref 15–41)
Albumin: 3.9 g/dL (ref 3.5–5.0)
Alkaline Phosphatase: 136 U/L — ABNORMAL HIGH (ref 38–126)
Anion gap: 11 (ref 5–15)
BUN: 18 mg/dL (ref 8–23)
CO2: 24 mmol/L (ref 22–32)
Calcium: 9.5 mg/dL (ref 8.9–10.3)
Chloride: 104 mmol/L (ref 98–111)
Creatinine: 1.04 mg/dL (ref 0.61–1.24)
GFR, Estimated: >60 mL/min (ref >60)
Glucose, Bld: 84 mg/dL (ref 70–99)
Potassium: 4.1 mmol/L (ref 3.5–5.1)
Sodium: 139 mmol/L (ref 135–145)
Total Bilirubin: 0.3 mg/dL (ref 0.0–1.2)
Total Protein: 6 g/dL — ABNORMAL LOW (ref 6.5–8.1)

## 2024-07-03 LAB — LACTATE DEHYDROGENASE: LDH: 332 U/L — ABNORMAL HIGH (ref 98–192)

## 2024-07-03 NOTE — Progress Notes (Signed)
 Nutrition Assessment Called patient's mobile wife did most of talking both present for call.  Reason for Assessment: MST screen for weight loss.    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 He continues to follow with [facility] and at [facility] for cardiology.  He was seen this week at [facility] for elevated blood pressure.  He is also followed by Dr. Timmy.   Underwent CAR-T therapy at [facility] during timeframe of significant weight loss.  Wife states they were told to eat whatever he could.  He used to be a meat and potato eater.  Meat and eggs were a struggle during CAR-T therapy.  He preferred to have snack foods (chips, popcorn, cheese puffs, Pimento cheese and crackers).  Now appetite is picking up and he is drinking a Nurri protein drink QD (low carb, 30 grams of protein) and sometimes BID.  Labs: CRP at [facility] 06/26/24  0.92, Total Pro 06/21/24 6.4, Hgb 10.0  Anthropometrics: 16# (7.76%) weight loss previous 3 months which is clinically significant.  Height: 68 Weight:  195# at [facility] 07/02/24 cardiology for elevated BP. 191 at [facility] 06/26/24 06/21/24  190# at [facility] 03/11/24  206# UBW: 210# BMI: 28.89    NUTRITION DIAGNOSIS: Inadequate PO intake to meet increased nutrient needs, r/t cancer diagnosis Food and Nutrition Related Knowledge Deficit related to cancer and associated treatments    INTERVENTION:  Relayed that nutrition services are wrap around service provided at no charge and encouraged continued communication if experiencing continued weight loss or any nutritional impact symptoms (NIS).  Educated on importance of adequate calorie and protein energy intake  with nutrient dense foods to decrease inflammation and maintain LBM and quality of life.  Reviewed source of micro nutrients of concern that are not present in usual PO and ONS particularly Vit C.  Contact information provided in  text   MONITORING, EVALUATION, GOAL: weight, PO intake, Nutrition Impact Symptoms, labs Goal is weight maintenance  Next Visit: PRN at patient or provider request  Micheline Craven, RDN, LDN Registered Dietitian, [facility] Part Time Remote (Usual office hours: Tuesday-Thursday) Cell: [phone]

## 2024-07-03 NOTE — Progress Notes (Signed)
 Sw[facility] Hematology and Oncology Follow Up Visit  Bruce Smith [id number] 1951/03/28 73 y.o. 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), 13q-, 1p-,1q-  Past Therapy: Status post surgical repair of left femur-07/14/2020 XRT to left shoulder and left hip Xgeva  120 mg subcu every 3 months-next dose in December 2022  Faspro/Velcade /Revlimid /Decadron  -- s/p cycle #2 -- start on       08/06/2020  ( Revlimid  is 14 on /14 off)    Current Therapy:        ASCT -- [facility] on -01/22/2021 Faspro - q month -- maintenance -- start on 05/13/2021 -- d/c on 10/10/2023 Zometa  4 mg IV every 3 months-next dose in 02/2024  Radiation therapy to T12, left iliac and left humerus --completed on 10/04/2022 --XRT to right femoral head -start 01/03/2023 XRT to T9 lesion -to be completed on 06/18/2023 Kyprolis /Pomalyst  -- s/p cycle #3 - start on 10/19/2023 IVIG 40 g monthly Carvykti -  given at [facility] on 03/27/2024    Interim History:  Bruce Smith is here today for follow-up.  He still having problems neurologically.  He still having problems with walking.  He is getting physical therapy.  He has had falling episodes.  He has some pain in the left scapula.  Will go ahead and get an x-ray of the shoulder and see how that looks.  He had a [facility].  He had a bone marrow biopsy.  The bone marrow biopsy showed that he was minimal residual disease negative.  The PET scan showed that he had no evidence of medullary or extramedullary disease.  Would have to say that he is in complete remission right now.  Unfortunately, he is still suffering from the neurologic side effects.  I would like to hope that this will improve.  He has had no problems with his appetite.  He has had no cough or shortness of breath.  He has had no nausea or vomiting.  There has been no bleeding.  Overall, I would say  that his performance status is probably ECOG 1.       Medications:  Allergies as of 07/03/2024   No Known Allergies      Medication List        Accurate as of July 03, 2024  1:36 PM. If you have any questions, ask your nurse or doctor.          STOP taking these medications    ALLERGY PO Stopped by: Bruce Smith   Ipratropium-Albuterol  20-100 MCG/ACT Aers respimat Commonly known as: COMBIVENT Stopped by: Bruce Smith       TAKE these medications    acetaminophen  500 MG tablet Commonly known as: TYLENOL  Take 2 tablets (1,000 mg total) by mouth 4 (four) times daily.   aspirin  EC 81 MG tablet Take 81 mg by mouth at bedtime.   benzonatate  200 MG capsule Commonly known as: TESSALON  Take 1 capsule (200 mg total) by mouth 3 (three) times daily as needed for cough.   CALCIUM  600 PO Take 600 mg by mouth daily.   cefdinir 300 MG capsule Commonly known as: OMNICEF Take 300 mg by mouth 2 (two) times daily.   cloNIDine 0.1 MG tablet Commonly known as: CATAPRES Take 0.1 mg by mouth.   DULoxetine  60 MG capsule Commonly known as: CYMBALTA  Take 1 capsule (60 mg total) by mouth daily.   famciclovir  250 MG  tablet Commonly known as: FAMVIR  TAKE 1 TABLET BY MOUTH EVERY DAY   fluconazole 200 MG tablet Commonly known as: DIFLUCAN Take 400 mg by mouth daily.   guaiFENesin  600 MG 12 hr tablet Commonly known as: MUCINEX  Take 1 tablet (600 mg total) by mouth 2 (two) times daily as needed for to loosen phlegm.   levETIRAcetam  500 MG tablet Commonly known as: KEPPRA  TAKE 1 TABLET BY MOUTH 2 TIMES A DAY   loperamide  2 MG tablet Commonly known as: IMODIUM  A-D Take 2 mg by mouth 4 (four) times daily as needed for diarrhea or loose stools.   loratadine 10 MG dissolvable tablet Commonly known as: CLARITIN REDITABS Take 10 mg by mouth daily.   LORazepam  1 MG tablet Commonly known as: ATIVAN  Take 1 mg by mouth 3 (three) times daily.   Magnesium  250 MG  Caps Take 250 mg by mouth daily. What changed: how much to take   metoprolol  succinate 50 MG 24 hr tablet Commonly known as: TOPROL -XL Take 50 mg by mouth daily. What changed: how much to take   naloxone  4 MG/0.1ML Liqd nasal spray kit Commonly known as: NARCAN  Administer as directed in each nostril in the event of an overdose. May repeat in 15 minutes.   nitroGLYCERIN  0.4 MG SL tablet Commonly known as: NITROSTAT  Place 0.4 mg under the tongue every 5 (five) minutes as needed.   ondansetron  8 MG tablet Commonly known as: Zofran  Take 1 tablet (8 mg total) by mouth every 8 (eight) hours as needed for nausea or vomiting.   pantoprazole  40 MG tablet Commonly known as: PROTONIX  TAKE 1 TABLET BY MOUTH 2 TIMES A DAY BEFORE MEALS FOR ACID REFLUX   polyethylene glycol powder 17 GM/SCOOP powder Commonly known as: GLYCOLAX /MIRALAX  Take 17 g by mouth daily.   pregabalin  100 MG capsule Commonly known as: LYRICA  TAKE 1 CAPSULE BY MOUTH 2 TIMES DAILY What changed:  medication strength how much to take Changed by: Bruce Smith   prochlorperazine  10 MG tablet Commonly known as: COMPAZINE  Take 1 tablet (10 mg total) by mouth every 6 (six) hours as needed for nausea or vomiting.   rosuvastatin  40 MG tablet Commonly known as: CRESTOR  TAKE 1 TABLET BY MOUTH EVERY DAY FOR CHOLESTEROL   saccharomyces boulardii 250 MG capsule Commonly known as: FLORASTOR Take 250 mg by mouth daily.   tamsulosin  0.4 MG Caps capsule Commonly known as: FLOMAX  TAKE 1 CAPSULE BY MOUTH EVERY NIGHT AT BEDTIME   tiZANidine 2 MG tablet Commonly known as: ZANAFLEX Take 2 mg by mouth 2 (two) times daily.   traMADol  50 MG tablet Commonly known as: ULTRAM  TAKE 2 TABLETS BY MOUTH EVERY 6 HOURS ASNEEDED FOR PAIN        Allergies: No Known Allergies  Past Medical History, Surgical history, Social history, and Family History were reviewed and updated.  Review of Systems: Review of Systems   Constitutional: Negative.   HENT: Negative.    Eyes: Negative.   Respiratory: Negative.    Cardiovascular: Negative.   Gastrointestinal: Negative.   Genitourinary: Negative.   Musculoskeletal:  Positive for joint pain.  Skin: Negative.   Neurological: Negative.   Endo/Heme/Allergies: Negative.   Psychiatric/Behavioral: Negative.       Physical Exam:  height is 5' 8 (1.727 m) and weight is 189 lb 6.4 oz (85.9 kg). His oral temperature is 98.1 F (36.7 C). His blood pressure is 136/63 and his pulse is 73. His respiration is 20 and oxygen saturation is  99%.   Wt Readings from Last 3 Encounters:  07/03/24 189 lb 6.4 oz (85.9 kg)  06/21/24 190 lb (86.2 kg)  03/11/24 206 lb (93.4 kg)    Physical Exam Vitals reviewed.  Constitutional:      Comments: This is a well-developed and well-nourished white male in no obvious distress.  He has a nicely healed median sternotomy scar.  HENT:     Head: Normocephalic and atraumatic.  Eyes:     Pupils: Pupils are equal, round, and reactive to light.  Cardiovascular:     Rate and Rhythm: Normal rate and regular rhythm.     Heart sounds: Normal heart sounds.  Pulmonary:     Effort: Pulmonary effort is normal.     Breath sounds: Normal breath sounds.  Abdominal:     General: Bowel sounds are normal.     Palpations: Abdomen is soft.  Musculoskeletal:        General: No tenderness or deformity. Normal range of motion.     Cervical back: Normal range of motion.     Comments: There is pain to palpation in the left hip area.  This is in the lateral hip area.  He has some decreased range of motion.  Lymphadenopathy:     Cervical: No cervical adenopathy.  Skin:    General: Skin is warm and dry.     Findings: No erythema or rash.  Neurological:     Mental Status: He is alert and oriented to person, place, and time.  Psychiatric:        Behavior: Behavior normal.        Thought Content: Thought content normal.        Judgment: Judgment  normal.      Lab Results  Component Value Date   WBC 2.2 (L) 07/03/2024   HGB 9.5 (L) 07/03/2024   HCT 28.0 (L) 07/03/2024   MCV 104.5 (H) 07/03/2024   PLT 63 (L) 07/03/2024   Lab Results  Component Value Date   FERRITIN 135 11/15/2023   IRON 35 (L) 11/15/2023   TIBC 225 (L) 11/15/2023   UIBC 190 11/15/2023   IRONPCTSAT 16 (L) 11/15/2023   Lab Results  Component Value Date   RETICCTPCT 1.3 11/14/2023   RBC 2.68 (L) 07/03/2024   Lab Results  Component Value Date   KPAFRELGTCHN <0.7 (L) 06/21/2024   LAMBDASER <1.5 (L) 06/21/2024   KAPLAMBRATIO UNABLE TO CALCULATE 06/21/2024   Lab Results  Component Value Date   IGGSERUM 624 06/21/2024   IGA <5 (L) 06/21/2024   IGMSERUM <5 (L) 06/21/2024   Lab Results  Component Value Date   TOTALPROTELP 6.0 06/21/2024   ALBUMINELP 3.5 06/21/2024   A1GS 0.3 06/21/2024   A2GS 0.8 06/21/2024   BETS 0.8 06/21/2024   GAMS 0.6 06/21/2024   MSPIKE Not Observed 06/21/2024     Chemistry      Component Value Date/Time   NA 139 07/03/2024 1231   K 4.1 07/03/2024 1231   CL 104 07/03/2024 1231   CO2 24 07/03/2024 1231   BUN 18 07/03/2024 1231   CREATININE 1.04 07/03/2024 1231      Component Value Date/Time   CALCIUM  9.5 07/03/2024 1231   ALKPHOS 136 (H) 07/03/2024 1231   AST 36 07/03/2024 1231   ALT 42 07/03/2024 1231   BILITOT 0.3 07/03/2024 1231       Impression and Plan: Mr. Stolz is a very pleasant 73 yo gentleman with kappa light chain myeloma with an autologous  stem cell transplant with [facility] on 01/22/2021.   He finally got to [facility] and went through the CAR-T protocol.  He was out there for 40 days.  Again, he had quite a few toxicities from the Carvykti.  I am happy that he is in remission.  The bone marrow biopsy shows this.  The PET scan shows this also.  Again, I would like to hope that he will recover some of the neurological issues.  I know he is getting physical therapy.  I told him that it may take a year before  he regains complete neurological function.  We will see what the x-rays show of his shoulder.  I will also get x-rays of his neck.  I think he sees Orthopedic Surgery on Friday.  Apparently, there may be some issues with his left hip.  I will plan to see him back in about a month or so.  When I see him back, we will then do his Zometa .  Bruce Smith Bruce Crease, MD 9/3/20251:36 PM

## 2024-07-04 ENCOUNTER — Other Ambulatory Visit: Payer: Self-pay

## 2024-07-04 LAB — KAPPA/LAMBDA LIGHT CHAINS
Kappa free light chain: <0.7 mg/L — ABNORMAL LOW (ref 3.3–19.4)
Kappa, lambda light chain ratio: UNDETERMINED
Lambda free light chains: <1.5 mg/L — ABNORMAL LOW (ref 5.7–26.3)

## 2024-07-04 LAB — IGG, IGA, IGM
IgA: <5 mg/dL — ABNORMAL LOW (ref 61–437)
IgG (Immunoglobin G), Serum: 465 mg/dL — ABNORMAL LOW (ref 603–1613)
IgM (Immunoglobulin M), Srm: <5 mg/dL — ABNORMAL LOW (ref 15–143)

## 2024-07-05 ENCOUNTER — Other Ambulatory Visit: Payer: Self-pay

## 2024-07-06 ENCOUNTER — Other Ambulatory Visit: Payer: Self-pay

## 2024-07-07 ENCOUNTER — Other Ambulatory Visit: Payer: Self-pay

## 2024-07-08 LAB — PROTEIN ELECTROPHORESIS, SERUM, WITH REFLEX
A/G Ratio: 1.5 (ref 0.7–1.7)
Albumin ELP: 3.2 g/dL (ref 2.9–4.4)
Alpha-1-Globulin: 0.2 g/dL (ref 0.0–0.4)
Alpha-2-Globulin: 0.8 g/dL (ref 0.4–1.0)
Beta Globulin: 0.7 g/dL (ref 0.7–1.3)
Gamma Globulin: 0.4 g/dL (ref 0.4–1.8)
Globulin, Total: 2.2 g/dL (ref 2.2–3.9)
SPEP Interpretation: 0
Total Protein ELP: 5.4 g/dL — ABNORMAL LOW (ref 6.0–8.5)

## 2024-07-08 LAB — IMMUNOFIXATION REFLEX, SERUM
IgA: 9 mg/dL — ABNORMAL LOW (ref 61–437)
IgG (Immunoglobin G), Serum: 524 mg/dL — ABNORMAL LOW (ref 603–1613)
IgM (Immunoglobulin M), Srm: <5 mg/dL — ABNORMAL LOW (ref 15–143)

## 2024-07-12 NOTE — Progress Notes (Signed)
 ABMT OUTPATIENT NOTE: CAR-T THERAPY  IDENTIFYING STATEMENT:  Bruce Smith is a 73 y.o. male from [place], [place] with the diagnosis of Multiple Myeloma.  Patient's referring physician is Dr. Maude Crease.  Patient's primary ABMT physician is Dr. Darra.  He is status post high dose Melphalan + Autologous stem cell support on 01/22/21.  He is currently receiving Fludarabine/Cyclophosphamide lymph-depleting chemotherapy, to be followed by CAR-T [place]) on 03/27/2024.  Today is Day 112.   CAR-T course: His routine Car-T course involved receiving Fludarabine and Cytoxan as lympho-depleting chemotherapy followed by CAR-T infusion.  His course was complicated by balance alterations and AMS resulting in a fall prior to receiving CAR-T infusion. After workup Levaquin was deemed to be the cause and he was switched to Cefdinir with resolution.  On 6/5 he was admitted for Gr 1 CRS in the form of febrile neutropenia  He received Toci x 1.  During his stay he also experienced Gr2 ICANS with an ICE score of 6/10. He was noted to be lethargic with new confusion and decreased ICE score of 6, unable to write sentence, count backwards, or identify month/city.   He received one 10 mg dose of Decadron  and his symptoms resolved.  Counts remain low currently, but he is still transfusion independent.  He will have his hickman removed prior to discharge.  Follow up has been arranged with his primary team.  HPI:   -Reports mild fatigue, similar as compared to prior, as well as mild petechial rash of the distal bilateral lower extremities; otherwise, no new symptoms. Creatinine remains mildly elevated at 1.5. Transaminases remain mildly elevated; AST 44 and ALT 60.   INTERVAL HISTORY 05/01/24 Day 34 post CAR T: History of Present Illness - He experiences aching and throbbing pain in his legs and feet, described as 'tingly and needly.' This pain has persisted since his treatment for multiple myeloma, which included stem cell therapy,  chemotherapy, radiation, and CAR T-cell therapy. The leg pain is associated with muscle tightness, particularly when standing, and he describes his muscles as 'very rigid.' No recent muscle spasms, but he mentions a past phase of cramping. -He has tried medications such as Neurontin, gabapentin, and Cymbalta , which have provided some relief. Currently, he takes tramadol  four times a day, every six hours, and has been off Tylenol  for over thirty days following CAR T-cell therapy. Previously, he was taking two 500 mg doses of Tylenol  four times a day. -He reports low energy levels and occasional neck pain over the past few days. He experienced a single moderate headache recently, but does not usually suffer from headaches. No recent confusion or lethargy, although he had a previous neurologic complication. He is able to identify the current month and city correctly. His signature appears normal. No new tremor. - He had nausea yesterday that responded to taking Compazine . - No fever, cough, congestion, shortness of breath, chest pain, diarrhea, constipation, nausea, or vomiting. He also denies any other joint pain or back pain, noting that his pain is primarily in his legs.  INTERVAL HISTORY 05/07/24 Day 41 post CAR T: - He experienced a fall last Thursday in his closet, losing balance when the sliding door moved unexpectedly, resulting in a bruise on his right upper arm and a significant bruise on his back. No dizziness or lightheadedness was noted at the time of the fall. - He has aching, throbbing pains in his legs and feet, along with muscle stiffness. He takes Tylenol , tramadol , and tizanidine for pain management,  which helps alleviate the pain, though the muscle relaxant causes sleepiness. He manages the pain effectively at home with these medications. - He experiences occasional nausea, present about half the time, managed with Zofran . If Zofran  is ineffective, he uses Compazine , which is more effective  but causes dry mouth and increased tiredness. - He mentions a recent alert from his phone indicating an elevated heart rate, reaching up to 100 bpm once. He is not currently on metoprolol , which he previously took to regulate his heart rate. His current heart rate is around 88 bpm.  - He is on several medications, including Diflucan, Famvir , Bactrim DS, and Keppra , which was recently discontinued. He also takes magnesium  as needed. He has been instructed to restart cefdinir due to a low neutrophil count. - No headaches, congestion, cough, sore throat, fever, chills, shaking, tremors, or seizures. Energy levels are medium, allowing him to perform routine activities around the house. He walks short distances, such as from his house to the garage. His appetite is slightly off, with weight fluctuating from 199 lbs last week to 196 lbs this week, though he is not overly concerned about this change.  INTERVAL HISTORY 05/15/24 Day 48 post CAR T: - He experiences nausea primarily in the mornings, often after eating breakfast, which has led to vomiting. He has vomited twice since the last visit here last week, with the vomitus described as undigested food with a yellowish bile stain. The episodes occur suddenly without prior feelings of nausea. He takes Zofran  as needed for nausea, but not more than once a day, and finds it somewhat helpful. - He feels off balance and experiences dizziness, particularly when standing up. He uses a rolling walker for support and has noticed increased difficulty with balance over the past week. His legs feel weak and fatigue quickly, requiring him to sit down after standing for 15-20 minutes. He experiences numbness and tingling in his feet, particularly on the left side. He takes tizanidine, a muscle relaxant, which provides some relief but also causes drowsiness. Muscle aches are present in his thighs, more pronounced on the left side, and he has a history of needing to lift his left  leg more due to weakness. This symptom has worsened since CAR T-cell therapy. - No falls, cough, congestion, fevers, or chills. No significant changes in appetite or taste, and he eats regularly.  INTERVAL HISTORY 05/22/24 Day 55 post CAR T: - He experiences daily nausea, requiring Zofran  and Compazine  for relief. Despite taking Protonix , nausea persists but has not led to recent vomiting. He feels 'wiped out' and more tired than usual, with reduced energy levels. - He has balance issues and lightheadedness, with symptoms fluctuating daily. Some days he feels off balance, while on others he does not. He spends significant time in a recliner. - He reports muscle weakness and soreness primarily in his legs, thighs, and calves. His legs are sore to the touch, and he has difficulty standing and walking. He uses tramadol  and Tylenol  for pain relief, but they provide minimal relief. He also applies CBD oil, which helps more than Biofreeze. He has a history of falling and reports muscle weakness. 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 He has clearing petechiae on his ankles and feet. He has a history of a cracked scapula and bruising on his left shoulder from a fall a few weeks ago.  - No fevers, chills, night sweats, congestion, runny nose, sore throat, vomiting, dizziness, vision  changes, or significant headaches. He reports mild headaches that resolve on their own. He has an appetite, though not at full capacity.  INTERVAL HISTORY 05/29/24 Day 62 post CAR T: -His neutrophil count remains at 0.8, which is below the desired level of 1.0. This count has been stable over the past few weeks, with previous counts of 0.8 last week and 0.7 the week before. He is receiving weekly injections to help increase his neutrophil count. - He experiences weakness and soreness in his legs and feet, with neuropathy affecting both areas. He has difficulty standing up from a squat  position without assistance. He has recently started physical therapy, which has increased his leg pain. He fell on Saturday night, resulting in a major bruise on his right side and arm, with some skin peeling. He attributes the fall to unsteadiness and difficulty with balance, particularly when turning. - He experienced a single episode of severe nausea and vomiting yesterday, described as 'projectile' and involving green bile. He is currently taking Protonix  40 mg twice daily before meals to manage stomach acid. He reports feeling better after the vomiting episode and has not had recurrent nausea. - He continues to experience a stinging sensation due to neuropathy in his feet. No increased headaches, dizziness, vision changes, fevers, congestion, sore throat, or cough.  INTERVAL HISTORY 06/07/2024 - day 71 post CAR-T: -He experiences falls, with two incidents occurring recently, resulting in bruises but no cuts or head injuries. His legs and feet give way, particularly when turning. He is attending physical therapy but has not noticed significant improvement yet. He uses a walker but fell while trying to retrieve it. His wife assists him with daily activities. - He experiences persistent soreness in his legs and feet, with no significant change in pain levels. His left hip flexion is limited. He reports one instance of increased pain after physical therapy, which resolved after rest. - He experiences nausea without vomiting, which is managed with medication. He has diarrhea a couple of times a day but has not required regular use of Imodium . - He has a new puppy, a miniature Dachshund, which brings joy but requires careful handling to avoid scratches or bites due to his condition. His wife ensures hygiene by wiping the puppy's paws and using alcohol wipes if scratched. Is aware to wear gloves for protection.  - No lightheadedness, dizziness, cough, congestion, shortness of breath, confusion, or  forgetfulness beyond ordinary levels.  INTERVAL HISTORY 06/12/24:  -  He again fell this past weekend after bending over to pickup an item. He has extensive bruising behind his left elbow and his left shoulder posteriorly from falling backwards. - He is undergoing PT twice weekly. The other night he took 2 Tramadol  due to tenderness from his fall and after PT .  He feels he has suboptimal pain control as is experiencing ongoing soreness. -  He is still having some nausea that responds to medication. - Often feels  off balance with any change in position. - No cough, congestion , fever, diarrhea.  No headaches. INTERVAL HISTORY 06/26/24 DAY  90 post CAR T: - He has been experiencing elevated blood pressure for the past 2 weeks, with readings ranging from 162/61 to 185/95 mmHg. He has not yet contacted his cardiologist, as he was waiting for this appointment. He does not have any anti-HTN medications on hold. - His mobility has been declining, with significant difficulty in walking. He has been undergoing physical therapy, but his condition is worsening.  He now requires a transport chair for longer distances and struggles to lift his legs, particularly the left one. His family and caregiver have noticed these changes, and physical therapy is making him sore without noticeable improvement. - He reports shoulder and neck pain, particularly on the left side where he has previously fallen. The pain is described as throbbing and aching, similar to a toothache, and it is affecting his sleep. He has not fallen recently, but the pain persists and is worsening. -  His white blood cell count has decreased, with neutrophils dropping to 0.2 today. His hemoglobin is stable, and his platelet count has improved.  - No recent falls, but he reports shoulder and neck pain, difficulty lifting his legs, and worsening mobility.  - No changes in hand movements or tremors at rest. No noticeable change in mentation.  INTERVAL  HISTORY 07/16/24 Day 110  post CAR T: - He experiences significant muscle weakness, primarily on the left side, leading to recurrent falls. His left leg occasionally 'just shuts off,' causing unexpected falls. The most recent fall occurred over two weeks ago, resulting in bruises on the left shoulder and scapula. He has 'good days and bad days' and cannot walk far before needing to sit down. - He has been undergoing physical therapy, which he is about to resume after a break. He received injections in both hips about a week ago but reports no noticeable improvement in his symptoms. - He experiences frequent loose stools, occurring at least once a day but no more than three times a day. He also reports nocturia, needing to urinate every hour to hour and a half at night, disrupting his sleep. He has been taking Flomax  (tamsulosin ) and recently adjusted the timing of his dose. - He reports still a decrease in appetite and has experienced weight loss, dropping from 191 lbs to 182 lbs. He gets full quickly and does not eat as much as he used to. He occasionally consumes protein shakes. - He has a history of low white blood cell counts, with the most recent count showing a total of 1.6 and neutrophils at 0.2. He is on antibiotics due to the low counts and receives monthly pentamidine breathing treatments. - No cough, congestion, fevers, chills, headaches, dizziness, or sore throat. Ongoing leg pain and bruising on the left side from falls. Ongoing frequent nocturia and loose stools.   CURRENT MEDICATIONS: Current Outpatient Medications  Medication Sig Dispense Refill  . ACETAMINOPHEN  ORAL Take 1,000 mg by mouth 4 (four) times daily    . calcium  carbonate-vitamin D3 (CALTRATE 600+D) 600 mg-5 mcg (200 unit) tablet Take 1 tablet by mouth 2 (two) times daily with meals    . cefdinir (OMNICEF) 300 mg capsule Take 1 capsule (300 mg total) by mouth every 12 (twelve) hours for 30 days    . DULoxetine  (CYMBALTA ) 60 MG  DR capsule Take 1 capsule (60 mg total) by mouth once daily 30 capsule 3  . famciclovir  (FAMVIR ) 500 MG tablet TAKE 1 TABLET BY MOUTH 2 TIMES A DAY 60 tablet 2  . fluconazole (DIFLUCAN) 200 MG tablet Take 2 tablets (400 mg total) by mouth once daily for 60 days. Begin with LD therapy and stop when ANC > 1000.    SABRA loratadine (CLARITIN) 10 mg tablet Take 1 tablet (10 mg total) by mouth once daily    . magnesium  oxide (MAG-OX) 400 mg (241.3 mg magnesium ) tablet Take 400 mg by mouth once daily    . metoprolol   SUCCinate (TOPROL -XL) 25 MG XL tablet Take 1 tablet (25 mg total) by mouth once daily 30 tablet 3  . naloxone  (NARCAN ) 4 mg/actuation nasal spray Place 4 mg into one nostril once    . nitroGLYcerin  (NITROSTAT ) 0.4 MG SL tablet Place 0.4 mg under the tongue every 5 (five) minutes as needed for Chest pain May take up to 3 doses.    . ondansetron  (ZOFRAN ) 8 MG tablet Take 1 tablet (8 mg total) by mouth every 8 (eight) hours as needed for Nausea or Vomiting    . pantoprazole  (PROTONIX ) 40 MG DR tablet Take 40 mg by mouth 2 (two) times daily before meals    . pentamidine (NEBUPENT) 300 mg solution for nebulization Take 6 mLs (300 mg total) by nebulization every 28 (twenty-eight) days    . polyethylene glycol (MIRALAX ) packet Take 17 g by mouth once daily Mix in 4-8ounces of fluid prior to taking.    . pregabalin  (LYRICA ) 100 MG capsule Take 1 capsule (100 mg total) by mouth 2 (two) times daily 60 capsule 2  . prochlorperazine  (COMPAZINE ) 5 MG tablet Take 1 tablet (5 mg total) by mouth every 6 (six) hours as needed for nausea or vomiting 40 tablet 2  . tamsulosin  (FLOMAX ) 0.4 mg capsule Take 1 capsule (0.4 mg total) by mouth once daily 30 capsule 2  . traMADoL  (ULTRAM ) 50 mg tablet Take 1 tablet (50 mg total) by mouth every 6 (six) hours as needed for pain (Patient taking differently: Take 50 mg by mouth 4 (four) times daily) 90 tablet 1  . [Paused] aspirin  81 MG EC tablet Take 81 mg by mouth once daily  (Patient not taking: Reported on 07/15/2024)    . rosuvastatin  calcium  (ROSUVASTATIN  ORAL) Take by mouth (Patient not taking: Reported on 07/16/2024)    . tiZANidine (ZANAFLEX) 2 MG capsule Take 1 capsule (2 mg total) by mouth 2 (two) times daily (Patient not taking: Reported on 07/16/2024) 60 capsule 0   No current facility-administered medications for this visit.    ALLERGIES:  No Known Allergies  Allergies  Allergen Reactions  . Corticosteroids (Glucocorticoids) Other (See Comments)    This patient has or will receive CAR-T Cell Therapy, a type of cancer immunotherapy that uses a patient's own T cells to attack cancer cells and should only receive corticosteroids in specific clinical situations.  It is recommended that corticosteroids be avoided prior to cell infusion and at least 6 months after CAR-T Cell Therapy has been given.  If you have questions, please page the [phone] attending [phone]).   ROS:  Constitutional: No fevers. No rigors/chills.  No night sweats. + weight loss.    Eyes: No vision changes.  No dry eyes.  No eye pain.  Ears, nose, mouth, throat: No epistaxis.  No oral pain.  No oral lesions/erythema.  No odynophagia/dysphagia. Hematologic/lymphatic: No bruising or adenopathy. Cardiovascular: No chest pain.  No edema.  Gastrointestinal: No N/V.  Had diarrhea after PET/CT contrast.  No constipation. No blood in stool.  No hematemesis. Genitourinary: No dysuria.  No hematuria.  No urinary frequency/urgency.  Integumentary: No rash.  No skin lesions. CVC site, no drainage or tenderness Neurologic: + Focal weakness with left hip flexion. Increasing  weakness and difficulty straightening legs bilaterally. No numbness.  No tingling.  Often off balance. Psychiatric: No anxiety.  No depression. Endocrine: No diabetes.  No thyroid  disease. Respiratory: No cough.  No dypsnea.  No hemoptysis. Musculoskeletal: + Ongoing thigh muscle pain and left scapular  pain.  Allergic/immunologic:  See Allergy section. KPS: 70%  PHYSICAL EXAM: Vitals:   07/16/24 1132 07/16/24 1322  BP: (!) 148/78 (!) 152/76  Pulse: 62 73  Resp: 18 16  Temp: 36.6 C (97.9 F)   TempSrc: Oral   SpO2: 100% 100%  Weight: 83.4 kg (183 lb 13.8 oz)   Height: 167 cm (5' 5.75)   PainSc:   7 0-No pain  PainLoc: Leg     Wt Readings from Last 3 Encounters:  07/16/24 83.4 kg (183 lb 13.8 oz)  07/15/24 82.8 kg (182 lb 8 oz)  06/26/24 86.8 kg (191 lb 5.8 oz)    General: Male.  Sitting in recliner, in no acute distress.  HEENT: [place]/AT. PERRL, EOMI, sclera anicteric.  Oral mucosa pink, moist.  No oral lesions.  Lungs: Clear to auscultation bilaterally.  Respirations regular. No audible W/R/R. Heart: Regular rate and regular rhythm. S1 and S2 present. No audible murmur, rub, or gallop.    Chest: Normal.   Abdomen: Active bowel sounds throughout. Soft, non distended, non tender. No palpable hepatosplenomegaly, or masses. Extremities: Slight firm edema at both ankles.  No cyanosis or edema in the upper extremities.  Skin: Warm, dry and intact.  Resolving bruising on left back/side.  Neurologic: +  Reduced strength 3/5 with left hip flexion, 4/5 right hip flexion. 4/5 bilaterally with right and left leg extension. Alert and oriented x 3. Cranial nerves II -XII intact.   LABS: Results for orders placed or performed in visit on 07/16/24  Complete Blood Count (CBC) with Differential  Result Value Ref Range   WBC (White Blood Cell Count) 1.6 (L) 3.2 - 9.8 x10^9/L   Hemoglobin 11.2 (L) 13.7 - 17.3 g/dL   Hematocrit 66.1 (L) 60.9 - 49.0 %   Platelets 61 (L) 150 - 450 x10^9/L   MCV (Mean Corpuscular Volume) 108 (H) 80 - 98 fL   MCH (Mean Corpuscular Hemoglobin) 35.7 (H) 26.5 - 34.0 pg   MCHC (Mean Corpuscular Hemoglobin Concentration) 33.1 31.5 - 36.3 %   RBC (Red Blood Cell Count) 3.14 (L) 4.37 - 5.74 x10^12/L   RDW-CV (Red Cell Distribution Width) 13.6 11.5 - 14.5 %   NRBC (Nucleated Red Blood Cell Count)  0.00 0 x10^9/L   NRBC % (Nucleated Red Blood Cell %) 0.0 %   MPV (Mean Platelet Volume) 12.6 (H) 7.2 - 11.7 fL   Neutrophil Count 0.2 (L) 2.0 - 8.6 x10^9/L   Neutrophil % 14.8 (L) 37 - 80 %   Lymphocyte Count 0.4 (L) 0.6 - 4.2 x10^9/L   Lymphocyte % 26.3 10 - 50 %   Monocyte Count 0.9 0 - 0.9 x10^9/L   Monocyte % 56.4 (H) 0 - 12 %   Eosinophil Count 0.03 0 - 0.70 x10^9/L   Eosinophil % 1.9 0 - 7 %   Basophil Count 0.01 0 - 0.20 x10^9/L   Basophil % 0.6 0 - 2 %   Slide Review/Morphology Yes    Immature Granulocyte Count 0.00 <=0.06 x10^9/L   Immature Granulocyte % 0.0 <=0.7 %   Immature Platelet Fraction 8.6 (H) 1.6 - 8.5 %  Comprehensive Metabolic Panel (CMP)  Result Value Ref Range   Sodium 138 135 - 145 mmol/L   Potassium 5.1 (H) 3.5 - 5.0 mmol/L   Chloride 104 98 - 108 mmol/L   Carbon Dioxide (CO2) 26 21 - 30 mmol/L   Urea Nitrogen (BUN) 31 (H) 7 - 20 mg/dL   Creatinine 1.1 0.6 - 1.3 mg/dL  Glucose 112 70 - 140 mg/dL   Calcium  9.6 8.7 - 10.2 mg/dL   AST (Aspartate Aminotransferase) 31 15 - 41 U/L   ALT (Alanine Aminotransferase) 61 (H) 15 - 50 U/L   Bilirubin, Total 0.4 0.4 - 1.5 mg/dL   Alk Phos (Alkaline Phosphatase) 100 24 - 110 U/L   Albumin  4.0 3.5 - 4.8 g/dL   Protein, Total 6.4 6.2 - 8.1 g/dL   Anion Gap 8 3 - 12 mmol/L   BUN/CREA Ratio 28 (H) 6 - 27   Glomerular Filtration Rate (eGFR)  71 mL/min/1.73sq m  Lactate Dehydrogenase (LDH)  Result Value Ref Range   Lactate Dehydrogenase (LDH) 149 100 - 200 U/L  Magnesium   Result Value Ref Range   Magnesium  2.0 1.8 - 2.5 mg/dL    TOXICITIES: - None currently  ABMT PROBLEM LIST: 1.  Kappa Light Chain Myeloma.             a.  Underwent cervical spine surgery via an anterior approach for cervical spondylosis on 06/17/20.              bSABRA Rasmussen on 06/26/20 and injured his left shoulder and plain films revealed a comminuted mid scapular fracture as well as an erosive lesion in the 4th left rib and lucencies in the left  proximal humerus.               c. CT of the head revealed lytic lesions in the skull; CT of the cervical spine revealed post op changes and lytic lesions in the T1 vertebral body; CT of the chest, abdomen and pelvis revealed an expansile lytic lesion in the left scapula with a fracture, a lytic lesion in the lateral left 4th rib with an associated soft tissue mass, multiple lytic lesions the right scapula, bilateral ribs and T11 vertebral body and numerous lytic lesions through out the lumbar spine and bony pelvis.               d. 06/29/20: no M-spike on SPEP; IgG 527 mg/dL, IgA 848 mg/dL and IgM 11 mg/dLl serum free kappa light chain level 15.47 mg/dL, lambda 9.30 mg/dL and ratio 77.57; LDH 849 U/L (98-192); hemoglobin 13.8 g/dL, creatinine 1.6 mg/dL, calcium  12.8 mg/dL; albumin  4.0 g/dL; received Xgeva .             e. PET/CT scan on 07/07/20 revealed multifocal hypermetabolic lytic bone lesions in the spine, ribs, humeri, both femurs including a large destructive lesion in the left proximal femur at risk for pathologic fracture and all areas of the pelvis.               f. 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 Underwent open reduction and internal fixation of the left femur lesion on 07/14/20 and pathology revealed a plasma cell neoplasm.               h. Bone marrow on 07/28/20 revealed 30-40% plasma cells and a small clonal B-cell population.              i. Treated with radiation therapy to his left shoulder and ribs, upper sacrum and left femur.              j. Started on dara-RVd on 08/11/20; serum free kappa light chain level 7.29 mg/dL, lambda 9.52 mg/dL and ratio  15.51.               k. Course was complicated by nausea, urinary retention and severe constipation.               l. 10/06/20: serum free kappa light chain level 0.98 mg/dL, lambda 9.52 mg/dL and ratio 7.90.              m. Revlimid  was  stopped on 10/13/20.             n. PET/CT scan on 10/16/20 revealed significant interval improvement with no new lesions.               o. Bone marrow on 10/19/20 was hypocellular with no excess plasma cells.               p. 11/03/20: serum  free kappa light chain level 0.82 mg/dL, lambda 9.70 mg/dL and ratio 7.16.              r. Fell on 11/16/20 and plain films revealed a lytic lesion in the proximal left fibula with a fracture along the medial aspect of the lesion.             s. 11/18/20: 0.08 g/dL M-spike; faint IgG kappa on IFE; serum free kappa light chain level 0.64 mg/dL, lambda 9.76 mg/dL and ratio 7.21.             t. 12/02/20: bone marrow was 5-10% cellular with no plasma cells and 3% monotypic B cells on flow;  limited FISH panel was negative for 17P and 11q abnormalities; cytogenetics were normal; echocardiogram revealed LVEF >55% with normal wall motion and GLS -14.8%; ECG revealed NSR with QTc 396 msec; PFTs revealed FVC 98%, FEV1 90% and DLCO 56.8%; chest x-ray revealed a consolidative opacity in the left lung apex with architectural distortion; left tib-fib films revealed a lucent lesion in the proximal left fibula with a nondisplaced fracture with evidence of healing.                u. Started G-CSF on 12/12/20 and had a Hickman catheter placed on 12/15/20 and received Mozobil that evening; underwent apheresis on 12/16/20 with collection of 3.62 x 10e6 CD34 cells/kg; received Mozobil again that evening and underwent apheresis on 12/17/20 with collection of 1.89 x 10e6 CD34 cells/kg.               v. Chest CT on 12/17/20 revealed a left apical bandlike consolidative opacity and traction bronchiectasis and multifocal osseous lesions.             w. 12/21/20: no M-spike on SPEP; faint IgG kappa on IFE; serum free kappa light chain level 0.42 mg/dL, lambda 9.80 mg/dL and ratio 7.78. x. 96/77/77: no M-spike on SPEP; faint IgG kappa on IFE; serum free kappa light chain level 0.47 mg/dL,  lambda  9.76 mg/dL and ratio 7.95.             y. High dose melphalan 200 mg/m2 followed by autologous stem cell infusion on 01/22/21.             z. 04/05/21: 0.02 g/dL M-spike; faint IgG kappa on IFE; IgG 225 mg/dL; serum free kappa light chain level 0.35 mg/dL, lambda 9.72 mg/dL and ratio 8.69.               aa. 05/13/21: started daratumumab  maintenance.             ab. 05/17/21: bone marrow normocellular with  no plasma cells and normal cytogenetics.             ac. 06/11/21: <0.1 g/dL M-spike; IgG kappa on IFE; IgG 255 mg/dL; serum free kappa light chain level 0.42 mg/dL, lambda 9.80 mg/dL and ratio 7.78.              ad. 07/07/21: IgG 220 mg/dL; serum free kappa light chain level 0.37 mg/dL, lambda 9.77 mg/dL and ratio 8.31.             ae. 08/19/2020: No M-spike; serum free kappa light chain level .49, lambda .21, and ratio 2.3.             af. 09/16/2021: No M-spike; IgG level 250mg /dL serum free kappa light chain level .43, lambda .21, and ratio 2.05.             ag. 10/26/2021: No M-spike; IgG level 330 mg/dL; serum free kappa light chain level 0.79, lambda 0.45, and ratio 1.67.             ah. 11/24/2021:  No M-Spike; IFE revealed IgG monoclonal protein with kappa light chain specificity; IgG level 365 mg/dL; serum free kappa light chain level .67, lambda .62, and ratio 1.08.             ai. 11/26/2021: No M-Spike; UPEP was unremarkable. Evidence of monoclonal protein was not apparent.             aj. 12/22/2021: No M-Spike; IgG level 303 mg/dL; serum free kappa light chain level .55, lambda .39, and ratio 1.41             ak. 01/05/2022: No M-Spike; IgG kappa on IFE; IgG level 288 mg/dL; serum free kappa light chain level 0.64, lambda .65, and ratio 0.98.             al. 01/14/22: MRI Pelvis/Left femur/lumbar spine: Overall, the numerous pelvis lesions appear decreased in size from prior. Some small prior bilateral iliac lesions are no longer visualized. There is interval decrease in enhancement  of the lesions with areas of central non enhancement now suggesting necrosis. Status post ORIF of the proximal left femur. The prior bone lesion and associated edema from pathologic fracture within the distal left femoral neck and superior left greater trochanter is overall smaller, with interval decrease in the marrow edema suggesting interval healing of the fracture. Edema deep to the left-greater-than-right iliacus muscles appears similar to prior. There is again fluid extending into the right iliopsoas bursa from the anterior superior right femoroacetabular joint fluid, measuring up to 2.6 cm similar to prior. No new compression fracture. There is increased canal and left foraminal stenosis at L2-L3.             am. 07/13/22: No M-Spike; IgG kappa on IFE; IgG level 273 mg/dL; serum free kappa light chain level 0.65 mg/dL, lambda 9.63 mg/dL and ratio 8.18.             an. 08/03/22: serum free kappa light chain level 0.54 mg/dL, lambda 9.66 mg/dL and ratio 8.35. ao. PET scan on 08/18/22 revealed a new areas of hypermetabolism at T12, the left iliac bone and the left mid humerus.   ap. Treated with 2500 cGy to the left humerus, T spine and left pelvis from 11/20-12/05/23.  aq. 11/29/22: serum free kappa light chain level 0.45 mg/dL, lambda 9.68 mg/dL and ratio 8.54. ar. PET scan on 12/12/22 revealed a new hypermetabolic lytic lesion in the right femoral head. as. 12/27/22: serum  free kappa light chain level 0.49 mg/dL, lambda 9.66 mg/dL and ratio 8.51. at. 96/98/75: 24 hour urine contained 88 mg of protein (100% albumin ) with no M-spike on UPEP or IFE; free kappa light chain level 2.03 mg/L, lambda <0.69 mg/L and ratio >2.94. au. Treated with 2400 cGy to the right pelvis from 03/07-03/18/24. av. 01/23/23: 0.04 g/dL M-spike; IgG kappa on IFE; serum free kappa light chain level 0.49 mg/dL, lambda 9.69 mg/dL and ratio 8.36. aw. 93/80/75: 0.1 g/dL M-spike; IgG kappa on IFE; IgG 312 mg/dL; serum free kappa  light chain level 0.88 mg/dL, lambda 9.26 mg/dL and ratio 8.78.   ax. PET/CT on 05/09/23 revealed a new area of focal hypermetabolism along the anterior aspect of T9 with mild corresponding lucencies on CT, resolution of previously noted uptake in the right femoral head and scattered lytic and sclerotic bone lesions with no uptake. ay. 05/16/23: no M-spike on SPEP or IFE; serum free kappa light chain level 0.82 mg/dL, lambda 9.39 mg/dL and ratio 8.62. az. 91/94/75: bone marrow revealed no excess plasma cells with a CD5/CD19 positive B cell population on flow.   ba. Treated with 2400 cGy to his thoracic spine from 08/06-08/19/24. bb. PET/CT scan on 09/26/23 revealed new hypermetabolic lesions at C6, L5, the proximal left femoral diaphysis and the right paraspinal region and a hypermetabolic left hilar lymph node with no pulmonary lesions. bc. Started on carfilzomib , pomalidomide  and dexamethasone  on 10/19/23. bd. 10/23/23: no M-spike on SPEP or IFE; serum free kappa light chain level 1.88 mg/dL, lambda 9.66 mg/dL and ratio 4.29. be. 98/69/74: serum free kappa light chain level 1.26 mg/dL, lambda 9.62 mg/dL and ratio 6.58. bf. 97/72/74: underwent apheresis with subsequent successful manufacture of Carykti carT cells. bg. 01/01/24: serum free kappa light chain level 1.78 mg/dL, lambda 9.36 mg/dL and ratio 7.16. bh. 95/97/74: serum free kappa light chain level 2.45 mg/dL, lambda 9.08 mg/dL and ratio 7.30; received IVIG that day. bi. PET/CT scan on 02/01/24 (compared to 09/25/24) revealed an increase in the size and activity of a hypermetabolic prevascular/AP window mass up to 4.1 x 3.2 cm (1.9 x 1.8 cm previously) with SUV 55, a new 0.7 cm LUL nodule with SUV 3.3, a new 0.7 cm RLL nodule and subtle soft tissue between the spine and the right psoas muscle with stable SUV 11, a new ventral peritoneal nodule with SUV 13.8, a lytic lesion in L5 with SUV 16 (7.1 previously), increased activity in the right  pedicle of L5 and cortical activity along the shaft of the left femoral prosthesis with SUV 49 (17.5 previously).  bj. Bone marrow on 02/02/24 was insufficient for diagnosis.  bk. Last dose of carfilzomib  on 01/15/24; started back on pomalidomide  on 01/30/24.   bl. 02/12/24: no M-spike on SPEP; faint IgG kappa on IFE; IgG 851 mg/dL; serum free kappa light chain level 4.40 mg/dL, lambda 8.32 mg/dL and ratio 7.36. bm. 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 Bn. PET/CT on 06/26/24 : IMPRESSION: 1.  Interval complete resolution of focal bony lesions with no new hypermetabolic bony lesions.  2.  No evidence of extramedullary disease with resolved extramedullary disease above and below the diaphragm.  3.  Interval resolution of bilateral pulmonary infection/inflammatory changes. 4.  Increased uptake about the distal femoral hardware, which may be suggestive of post-treatment inflammatory change versus loosening. Recommend attention on follow-up.   Bl. 06/26/24 BMBX with Clonoseq and Myeloid NGS: MRD negative. Myeloid NGS did not detect any pathogenic variants.  2. History of CAD with MI (Stents x 2).             a. Admitted to the hospital on 03/08/23 with a myocardial infarction and underwent a three vessel CABG on 03/13/23.     3. BPH             a. Started Flomax  in early 02/22  DISPOSITION: DISEASE: Kappa Light Chain Myeloma - High dose Melphalan 200 mg/m2 followed by autologous stem cell infusion on 01/22/21. - He is currently receiving Fludarabine/Cyclophosphamide lymph-depleting chemotherapy, to be followed by CAR-T [place]) on 03/27/2024.   CELLULAR THERAPY: Preparative regimen:   - Fludarabine 30 mg/m (65 mg) IV: Day -5 through Day -2. - Cyclophosphamide 300 mg/m (650 mg) IV: Day -5 through Day -2.  Stem Cells:   - CAR-T cell reinfusion on 03/28/24.  Was scheduled for 5/28, but patient had fall and AMS.  Supportive Care: - Transfuse prn hgb <=8gm/dl  and plts <=89X. No transfusions today.  - 6/27: mild petechiae over anterior aspect of distal lower extremities bilaterally, present x4-5 days per pt. Perhaps mild progression over past few days. Plts starting to uptrend; optimistic for improvement as plts increase.    - 05/08/24 stable petechiae. Plts stable at 35.  - 05/22/2024 clearing petechiae. Platelets stable at 37.  -  Outside labs 06/21/24 WBC 2.2, Hgb 10.0, plt ct 54, ANC 1.1.              -  06/26/24 labs: WBC 1.0, Hgb 9.9, Plt ct 59, ANC 0.2.  - 07/16/24: WBC 1.6, ANC 0.2,  Hgb 11.2, plt ct 61.  CAR-T Toxicities (date/grade): - Grade 1 CRS; Toci x 1 - Grade 2 ICANS; Dex 10 mg x 1 for ICE 6/10  INFECTIOUS DISEASE Prophylaxis:  - Bacterial: Cefdinir 300 mg po q 12 hrs. continue through ANC >1000. (levaquin discontinued as possible cause of AMS). If ANC >1000  could discontinue. D/c on 06/12/24. Resumed on 06/27/24 for ANC 0.2. - Viral: Famciclovir  500mg  po BID will continue for 6 months s/p CAR-T treatment (can then d/c if CD4 >200). - Fungal: Fluconazole 400mg  daily will continue through ANC >1000.  If ANC >1000 could discontinue.  D/C on 06/12/24. Resumed on 06/27/24 for ANC 0.2. - PCP: Bactrim DS 1 tab po Mon/Wed/Fri will continue for 6 months s/p CAR-T treatment (can then d/c if CD4 >200).  On 05/08/2024 discontinued Bactrim with concern for ongoing pancytopenia.  Same day initiated pentamadine treatments to continue every 28 days. Given 06/07/2024.  Next due on 07/05/24. Administered on 07/16/24.  Next due around 08/13/24.  Febrile neutropenia: 6/5: Admitted with fever 102.2. Was on Vanc for SSTI at CVC site, cefepime  initiated. FFWU negative and remained afebrile therefore transitioned back to cefdinir and vanc d/c'd. Toci x 1 given for likely CRS  Weekly Monitoring: CMV: 04/08/24 - 06/26/24: negative  CARDIAC:  CAD (Hx of CABG/Stents): - Current Regimen: Toprol -XL 25 mg daily + ASA 81 mg daily. ON HOLD with decreased BP and plts <50K.  Could  restart ASA if plts remain above 50K.   Angina: - Current Regimen: Nitro 0.4 mg every 5 minutes PRN  GI: N/V Prophylaxis:  - Patient currently has Compazine  and Zofran  as needed for breakthrough symptoms.  - 6/9: IV Zofran  given in clinic x1 on 04/08/24.  Will allow for Ativan  oral to be given at night. - 6/10: nausea improved.   GERD: - Current Regimen: Protonix  40 mg 2x daily.  Probiotic Supplementation: -  Current Regimen: None.  Patient will hold Florastor during acute CAR-T process.  GU:  BPH:  - Current Regimen: Flomax  0.4 mg daily--holding currently--restarted 04/14/24  Cr elevation: - 6/27: Cr 1.5 today; baseline pre-transplant 1.2. Cr has been 1.4-1.7 since 6/5. No decreased PO to suggest pre-renal physiology 6/27. Reported possible mild urinary obstructive sxs occurring ~6/20-6/24; bladder scan 6/27 0 cc (measured shortly after pt voided). 6/27 UA normal. As such, Cr elevation possibly attributable to bactrim.  - Consider transition from bactrim to atovaquone if Cr rises in future. Currently stable at 1.5. - Bactrim discontinued due to concern for ongoing pancytopenia.  Creatinine improved to 1.4. - Creatinine now further improved at 1.3 - 1.0.  CRS MONITORING: - Patient underwent a ROS as above. - Patient's vital signs were monitored, including BP and O2 sats.   NEURO: Peripheral Neuropathy: - Current Regimen: Lyrica  200 mg 2x daily + Cymbalta  60 mg daily  AMS, Hallucinations - Onset 5/27 after LD chemo prior to CAR-T cells. Head CT 5/28 NAICP. MRI 5/28 ordered, cancelled on 5/29 given improvement in AMS - 6/5: lethargy and confusion on admission, ICE 6/10, unable to write sentence, count backwards or identify month/city. Dex 10 mg x 1 given for gd 2 ICANS with improvement to 10/10.    Weakness, Balance Deficits Fall - 5/27: s/p fall in shower.  - 5/28:  Head CT 5/28 NAICP. Infectious w/u 5/28 including BCx x2 NGTD.   Seizure Prophylaxis: - Patient will continue on  Keppra  500mg  po Q12hrs through Day +30 (can then d/c if no seizure activity).  Dc'd 6/27 (D+29 PM dose; day of discharge from [facility]). Discontinued.  Neurotoxicity Monitoring: -Having increasing lower extremity muscle stiffness  MSK:  Generalized Pain: - Current Regimen: Tramadol  50 mg 4x daily PRN.  Added Tylenol  500 mg up to twice daily and tizanidine muscle relaxant 2 mg once daily with interval improvement. - Increased tizanidine to twice daily.  Patient aware that this increases fatigue. - For left hip flexion weakness, referring him for PT local to his residence. - Still with myalgias and muscle stiffness in legs.  Continuing Tramadol .  Started PT on 7/29.  Will continue next week 2 times per week. - Worsening leg weakness and stiffness as of 06/26/24.  Requesting Neuro consult,  MRI brain, MRI total spine. ALC has remained low. May need LP dependent on MRI findings. PET/CT noting loosening/ inflamed left distal femoral hardware.  - Evaluated by Dr. Redell Seltzer at [facility] on 07/15/24. His exam with notable findings including LUE and LLE>RLE weakness without isolated parkinsonism--his impairment of RAMs is driven by weakness, and there is no rigidity, tremor, shuffling gait, or other quality to implicate parkinsonism. It appears that weakness is the driving factor for his motor changes. Recommended to continue PT, start OT, obtain EMG/NCS, and follow-up with neuromusuclar medicine.   Hypomagnesemia: - Current Regimen: Magnesium  oxide 400 mg daily with food.  Hypocalcemia: - Current Regimen: Calcium  + Vitamin D3: 1 tablet 2x daily with food.  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 -  05/01/24 IgG 417. -  05/29/24 IgG 300. - 06/12/24  IVIG administered.  FOLLOW-UP: - Patient today at day 110, ANC fell from 1.1 to 0.2. Releuko  today.  Will needs labs followed weekly.  Next week scheduled locally with Dr. Timmy. Continuing prophy with Cefdinir and  Diflucan. -   Neuro consult as above noting impairment related to weakness recommending PT/OT, EMG/NGS.  Will keep scheduled  MRI brain, MRI  total spine. Had Orthopedic office visit with hip injection and notes minimal benefit. -  Will continue pentamidine nebulizer therapy every 28 days for PJP prophy. Treatment administered today on 07/16/24. Due next on 08/13/24. - IVIG administered on 06/12/24 for IgG level below 400. - Day 90 IR BMBX on 06/26/24 with MRD with ClonoSeq is MRD negative. Myeloid NGS did not detect any pathogenic variants. Restaging PET/CT on 06/26/24  noting interval complete resolution of focal bony lesions with no new hypermetabolic bony lesions c/w disease response. -- In 2 weeks initial clinic visit with Dr. Cynthea Purpura as Dr. Darra has retired. -- Patient was instructed to call if any questions, concerns, AMS changes, or temperature of 100.9F or greater when not in ABMT clinic.  Plan of care reviewed with and formulated by Dr. Purpura.  I spent a total of 45 minutes in both face-to-face and non-face-to-face activities, excluding procedures performed, for this visit on the date of this encounter.    Future Appointments  Date Time Provider Department Center  07/22/2024  3:00 PM [facility] MOBILE MRI 2 DMIRMOB [facility] [employer]  07/25/2024  5:45 PM [facility] MR 3 [facility]  07/30/2024  7:45 AM STUDIES/LAB-BCC BCCPHLEB [facility] B[facility]OD [facility]  07/30/2024  8:30 AM Purpura Cynthea, MD BCCABMT [facility] B[facility]OD [facility]  07/30/2024  9:00 AM BMT-CLINIC NURSE B[facility]ABMT [facility] B[facility]OD [facility]  09/04/2024 10:30 AM EMG LAB 4 EMGLAB [facility]  12/16/2024  9:30 AM [place] Corean Cassis, MD NEUROSCCTR [facility]     CONTACT INFO: Patient's cell: [phone] BENNIE)    Attestation Statement:   I personally performed the service, non-incident to. (WP)   TANYA ALANA NOVEL, PA

## 2024-07-13 ENCOUNTER — Other Ambulatory Visit: Payer: Self-pay | Admitting: Hematology & Oncology

## 2024-07-13 DIAGNOSIS — C9000 Multiple myeloma not having achieved remission: Secondary | ICD-10-CM

## 2024-07-16 NOTE — Progress Notes (Signed)
 Patient received albuterol , Releuko , and Pentamidine per providers order. Patient tolerated well. Vitals taken after treatment. Patient states understanding of signs to contact ofice for.

## 2024-07-18 NOTE — Progress Notes (Signed)
 Patient came in office for a blood pressure check was 148/78 patient stated he has been doing pretty good and had no questions or concerns at this time. Stated to patient that I would forward message to Josette Silvan PA for review. Patient verbalize to understanding.

## 2024-07-19 ENCOUNTER — Telehealth: Payer: Self-pay

## 2024-07-19 ENCOUNTER — Other Ambulatory Visit: Payer: Self-pay

## 2024-07-19 DIAGNOSIS — C9000 Multiple myeloma not having achieved remission: Secondary | ICD-10-CM

## 2024-07-19 NOTE — Telephone Encounter (Signed)
 Received phone call from patient wife stating that per his team at [facility] he is to be doing weekly labs to check his WBC and ANC and to have a gcsf shot if indicated.  Reviewed request with Dr. Timmy who stated patient can do weekly labs and injections. Per Dr. Timmy pt to receive gcsf shot if ANC less than 1.0  This RN assisted pt wife in getting scheduled for labs next week and injection appt. Pt wife appreciative of help and verbalized understanding of appointment time.

## 2024-07-19 NOTE — Progress Notes (Signed)
 Continue current medications as prescribed and Clonidine prn  Josette Silvan, PA-C [facility]

## 2024-07-23 ENCOUNTER — Ambulatory Visit: Payer: Self-pay | Admitting: Hematology & Oncology

## 2024-07-23 ENCOUNTER — Inpatient Hospital Stay

## 2024-07-23 ENCOUNTER — Ambulatory Visit: Admitting: Hematology & Oncology

## 2024-07-23 ENCOUNTER — Ambulatory Visit

## 2024-07-23 DIAGNOSIS — C9001 Multiple myeloma in remission: Secondary | ICD-10-CM | POA: Diagnosis not present

## 2024-07-23 DIAGNOSIS — C9000 Multiple myeloma not having achieved remission: Secondary | ICD-10-CM

## 2024-07-23 LAB — CBC WITH DIFFERENTIAL (CANCER CENTER ONLY)
Abs Immature Granulocytes: 0.13 K/uL — ABNORMAL HIGH (ref 0.00–0.07)
Basophils Absolute: 0 K/uL (ref 0.0–0.1)
Basophils Relative: 1 %
Eosinophils Absolute: 0 K/uL (ref 0.0–0.5)
Eosinophils Relative: 1 %
HCT: 38.2 % — ABNORMAL LOW (ref 39.0–52.0)
Hemoglobin: 12.7 g/dL — ABNORMAL LOW (ref 13.0–17.0)
Immature Granulocytes: 3 %
Lymphocytes Relative: 11 %
Lymphs Abs: 0.5 K/uL — ABNORMAL LOW (ref 0.7–4.0)
MCH: 34.8 pg — ABNORMAL HIGH (ref 26.0–34.0)
MCHC: 33.2 g/dL (ref 30.0–36.0)
MCV: 104.7 fL — ABNORMAL HIGH (ref 80.0–100.0)
Monocytes Absolute: 1 K/uL (ref 0.1–1.0)
Monocytes Relative: 26 %
Neutro Abs: 2.3 K/uL (ref 1.7–7.7)
Neutrophils Relative %: 58 %
Platelet Count: 73 K/uL — ABNORMAL LOW (ref 150–400)
RBC: 3.65 MIL/uL — ABNORMAL LOW (ref 4.22–5.81)
RDW: 13.4 % (ref 11.5–15.5)
WBC Count: 3.9 K/uL — ABNORMAL LOW (ref 4.0–10.5)
nRBC: 0 % (ref 0.0–0.2)

## 2024-07-23 NOTE — Progress Notes (Signed)
 No pegfilgrastim today per Dr Timmy.  ANC 2.3.  Patient and wife aware.

## 2024-07-24 NOTE — Progress Notes (Addendum)
 [facility]  New Consultation  07/29/2024  Reason for Consult:  Neuropathy, weakness.  History of Present Illness:  Bruce Smith is a 73 y.o.,  Caucasian male with a PMHx of multiple myeloma (dx 2021 and treated with stem cell therapy, chemo, and radiation) now in remission, HTN, stent LAD-RCA, HLD, anemia, BPH, hx blood and bone marrow transplant, cervical spondylosis with myelopathy and radiculopathy.  He was referred by Dr. Lincoln with movement disorders clinic after visit on 07/15/2024 where he reported: CAR T treatment 4 months ago.  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 He was using cane then. However, since CAR T, he has had significant decline in gait. Can walk with walker, but gets fatigued. He states he is able to walk for about 10 minutes at a time. After a rest, he can walk again.    In general, he stumbles and has sustained several falls, about 8 times in past 4 months. Almost always falls backwards. Occurs with and without walker. No head strike or broken bones, but has had many bruises. His left side is weaker than the right. Needs help transferring into car. His wife helps with transfers. They have plans to make changes to the house to make it more handicap accessible. He also has arm weakness, worse on left. He does report that his left wrist may turn in flexion. He does endorse left foot dragging, present before CAR T but now worse.    No fatiguing of swallowing. No breathing concerns. No diplopia or notable visual changes. Denies slowness or reduced dexterity. Denies stiffness.  He has engaged in some PT, reports has not helped very much but he will be restarting OT and PT.  He continues on Lyrica  200mg  and Cymbalta  60mg  for his neuropathy.   He has a referral for EMG, it is currently scheduled for November 2025.   Interval history: History of Present Illness Bruce Smith presents to clinic today accompanied with his wife.  He presents  for evaluation of leg and foot weakness and to rule out chemotherapy-induced myopathy.  He experiences muscle soreness, fatigue, and weakness.   He experiences significant weakness and instability in his legs and feet, leading to frequent falls, primarily backwards. He describes his legs and left hand as 'not working right' and mentions instability and fatigue, with knees and legs buckling at times, but reports that when he falls, it is guided slowly to the ground. Despite these issues, he noted a recent improvement, stating that he was able to walk a longer distance without assistance, which he had not been able to do previously and did not feel fatigued.  He has a history of neuropathy, primarily affecting his feet, with symptoms of burning and stinging, especially noticeable upon standing. He is currently taking Lyrica  and cymbalta  for neuropathy.   He has not yet undergone an EMG, which is being considered to further evaluate his symptoms. He is on a waitlist for this procedure, scheduled for November, but is hoping for an earlier appointment.   Work-up: Imaging: 07/25/24 MRI brain wwo contrast  Not read yet  07/22/2024 MRI total spine wwo contrast IMPRESSION:  1. 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 Of note, there is no extraosseous extension of tumor identified. 2. Pathologic superior endplate compression fracture at T9. Inferior endplate height loss at L1 and L2 may be either degenerative (favored) or pathologic in nature. No bony spinal canal compromise. 3. Multilevel degenerative changes resulting in multilevel  spinal canal stenosis and neural foraminal narrowing. See detailed level by level description above. 4. Postsurgical changes, as described. 5. 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 The spinal cord is otherwise normal.  10/15/2023 MRI brain wwo contrast IMPRESSION: No evidence of acute intracranial abnormality.    03/04/2023 MRI lumbar spine IMPRESSION:  1. Multiple foci of abnormal marrow signal consistent with the  patient's history of chronic multiple myeloma. Some of these have increased slightly since 2023, as discussed above.  2. Chronic pathologic fracture at L2 has not progressed.  3. L2-3: Broad-based herniation of disc material with caudal  migration in the left lateral recess behind L3. Multifactorial  spinal stenosis at this level that could be symptomatic,  particularly on the left. Slight worsening since 2023.  4. L3-4: Previous partial left hemilaminectomy. Endplate osteophytes and shallow protrusion of the disc. Facet and ligamentous hypertrophy on the right. Mild stenosis of the right lateral recess and intervertebral foramen on the right but without definite neural compression. Similar appearance to the prior study.  5. L4-5: Endplate osteophytes and bulging of the disc. Mild  narrowing of the right lateral recess but no likely neural  compression. No significant change.   Procedures: ECHO complete 03/20/24 CONCLUSION  NORMAL LEFT VENTRICULAR SYSTOLIC FUNCTION WITH NO LVH ESTIMATED EF: >55% INDETERMINATE DIASTOLIC FUNCTION NORMAL RIGHT VENTRICULAR SYSTOLIC FUNCTION VALVULAR REGURGITATION: No AR, TRIVIAL MR, TRIVIAL PR, TRIVIAL TR NO VALVULAR STENOSIS Compared with prior Echo study on 12/20/2020 NO SIGNIFICANT CHANGES   Labs: 07/2024 Mag: 2.0 CMV not detected 05/2024 SPEP: no paraprotein peak detected  11/15/23 Vit B12: 965  11/13/23 Vit B12: 346 TSH: 1.182  03/2023 A1c: 5.5  Neurology medication side effects: None Falls risk assessment: Falls risk present  and falls are occuring  Review of Systems: A 14 system review of systems was performed today. See HPI for pertinent symptoms.   Medications: Current Outpatient Medications  Medication Sig Dispense Refill  . ACETAMINOPHEN  ORAL Take 1,000 mg by mouth 4 (four) times daily    . [Paused] aspirin  81 MG EC tablet  Take 81 mg by mouth once daily (Patient not taking: Reported on 07/15/2024)    . calcium  carbonate-vitamin D3 (CALTRATE 600+D) 600 mg-5 mcg (200 unit) tablet Take 1 tablet by mouth 2 (two) times daily with meals    . cefdinir (OMNICEF) 300 mg capsule TAKE 1 CAPSULE BY MOUTH EVERY 12 HOURS FOR 30 DAYS 60 capsule 1  . DULoxetine  (CYMBALTA ) 60 MG DR capsule Take 1 capsule (60 mg total) by mouth once daily 30 capsule 3  . famciclovir  (FAMVIR ) 500 MG tablet TAKE 1 TABLET BY MOUTH 2 TIMES A DAY 60 tablet 2  . fluconazole (DIFLUCAN) 200 MG tablet Take 2 tablets (400 mg total) by mouth once daily for 60 days. Begin with LD therapy and stop when ANC > 1000.    SABRA loratadine (CLARITIN) 10 mg tablet Take 1 tablet (10 mg total) by mouth once daily    . magnesium  oxide (MAG-OX) 400 mg (241.3 mg magnesium ) tablet Take 400 mg by mouth once daily    . metoprolol  SUCCinate (TOPROL -XL) 25 MG XL tablet Take 1 tablet (25 mg total) by mouth once daily 30 tablet 3  . naloxone  (NARCAN ) 4 mg/actuation nasal spray Place 4 mg into one nostril once    . nitroGLYcerin  (NITROSTAT ) 0.4 MG SL tablet Place 0.4 mg under the tongue every 5 (five) minutes as needed for Chest pain May take up to 3  doses.    . ondansetron  (ZOFRAN ) 8 MG tablet Take 1 tablet (8 mg total) by mouth every 8 (eight) hours as needed for Nausea or Vomiting    . pantoprazole  (PROTONIX ) 40 MG DR tablet Take 40 mg by mouth 2 (two) times daily before meals    . pentamidine (NEBUPENT) 300 mg solution for nebulization Take 6 mLs (300 mg total) by nebulization every 28 (twenty-eight) days    . polyethylene glycol (MIRALAX ) packet Take 17 g by mouth once daily Mix in 4-8ounces of fluid prior to taking.    . pregabalin  (LYRICA ) 100 MG capsule Take 1 capsule (100 mg total) by mouth 2 (two) times daily 60 capsule 2  . prochlorperazine  (COMPAZINE ) 5 MG tablet Take 1 tablet (5 mg total) by mouth every 6 (six) hours as needed for nausea or vomiting 40 tablet 2  . rosuvastatin   calcium  (ROSUVASTATIN  ORAL) Take by mouth (Patient not taking: Reported on 07/16/2024)    . tamsulosin  (FLOMAX ) 0.4 mg capsule Take 1 capsule (0.4 mg total) by mouth once daily 30 capsule 2  . tiZANidine (ZANAFLEX) 2 MG capsule Take 1 capsule (2 mg total) by mouth 2 (two) times daily (Patient not taking: Reported on 07/16/2024) 60 capsule 0  . traMADoL  (ULTRAM ) 50 mg tablet Take 1 tablet (50 mg total) by mouth every 6 (six) hours as needed for pain (Patient taking differently: Take 50 mg by mouth 4 (four) times daily) 90 tablet 1   No current facility-administered medications for this visit.   Allergies, Past Surgical History and Family History: Reviewed in chart  Social History: Social History   Socioeconomic History  . Marital status: Married  Tobacco Use  . Smoking status: Former    Current packs/day: 0.00    Average packs/day: 1 pack/day for 15.0 years (15.0 ttl pk-yrs)    Types: Cigarettes    Start date: 11/01/1983    Quit date: 10/31/1998    Years since quitting: 25.7  . Smokeless tobacco: Former    Types: Chew    Quit date: 10/24/2020  . Tobacco comments:    Occasional chew 01/23/2023  Vaping Use  . Vaping status: Never Used  Substance and Sexual Activity  . Alcohol use: Yes    Comment: 3-4 beers per year  . Drug use: Not Currently    Types: Marijuana   Social Drivers of Health   Financial Resource Strain: Patient Declined (04/04/2024)   Overall Financial Resource Strain (CARDIA)   . Difficulty of Paying Living Expenses: Patient declined  Food Insecurity: No Food Insecurity (04/04/2024)   Hunger Vital Sign   . Worried About Programme Researcher, Broadcasting/film/video in the Last Year: Never true   . Ran Out of Food in the Last Year: Never true  Transportation Needs: No Transportation Needs (04/04/2024)   PRAPARE - Transportation   . Lack of Transportation (Medical): No   . Lack of Transportation (Non-Medical): No  Social Connections: Moderately Integrated (11/13/2023)   Received from [facility]    Social Connection and Isolation Panel   . In a typical week, how many times do you talk on the phone with family, friends, or neighbors?: More than three times a week   . How often do you get together with friends or relatives?: Twice a week   . How often do you attend church or religious services?: 1 to 4 times per year   . Do you belong to any clubs or organizations such as church groups, unions, fraternal or athletic  groups, or school groups?: No   . How often do you attend meetings of the clubs or organizations you belong to?: Never   . Are you married, widowed, divorced, separated, never married, or living with a partner?: Married  Housing Stability: Low Risk  (04/04/2024)   Housing Stability Vital Sign   . Unable to Pay for Housing in the Last Year: No   . Number of Times Moved in the Last Year: 0   . Homeless in the Last Year: No    Physical Examination: Vitals:   07/29/24 1346  BP: 136/67  Pulse: 65  SpO2: 99%  Weight: 81.6 kg (180 lb)  Height: 170.2 cm (5' 7)  PainSc:   7  PainLoc: Leg   General: Bruce Smith is a very pleasant, well-appearing male, in no acute distress. HEENT: Normocephalic. Atraumatic. Sclerae clear. Mucous membranes moist.  Skin: No overt signs of rashes or lesions.  Musculoskeletal: No joint swelling or bony deformities.  Extremity: Warm and well perfused. No LE edema.    Neurologic Exam: Mental Status: Awake & Alert. Able to provide the history, answer questions appropriately and follow commands. Attention and concentration is normal. Language is fluent.  Speech: No aphasia. No dysarthria.  Cranial Nerves:  I: Deferred II,III: Pupils 3 mm, round, brisk bilaterally. VFF.  III,IV,VI: EOMI w/o nystagmus. No ptosis. V: Sensation intact LT in V1-V3 distribution b/l.  VII: Face symmetric without weakness. VIII: Hearing grossly intact to voice. IX,X: Voice normal, palate elevates symmetrically. XI: SCM/trapezius strength and shrug symmetric b/l.  XII:  Tongue protrudes midline. No atrophy or fasciculation. Motor: Normal bulk and tone. Mild arm drift on left. No tremors. No fasciculations. No satelliting. No cogwheeling or rigidity.  RUE full strength. Left bicep 4/5, tricep 4-/5, WF 4-/5, interossei 4+/5. Right HF 4-/5. Left HF 3/5. Right quad and hamstring 4+/5, ankle 5/5. Left quad and hamstring 4/5, ankle DF 4-/5. Sensation: Intact to PP and cool temperature in all limbs. Vibration intact at the ankles. Coordination: FTN without dysmetria or ataxia, unable to perform HTS d/t mobility at bilateral HF. Romberg present. Reflexes: +1 symmetric throughout all extremities. Toes down-going b/l. No Hoffman's sign.  Gait: He used his rollator for ambulation; good turn with rollator.   Assessment and Plan: Bruce Smith is a 73 y.o. Caucasian male with a PMHx of multiple myeloma (dx 2021 and treated with stem cell therapy, chemo, and radiation) now in remission, HTN, stent LAD-RCA, HLD, anemia, BPH, hx blood and bone marrow transplant, cervical spondylosis with myelopathy and radiculopathy.  Neurology was consulted at a referral by Dr. Lincoln with movement disorders clinic after visit on 07/15/2024. He presents for evaluation of leg and foot weakness and to rule out chemotherapy-induced myopathy. His last CART treatment was 4 months ago where he has had significant decline in gait. Can walk with walker, but gets fatigued. He states he is able to walk for about 10 minutes at a time. After a rest, he can walk again. He has had neuropathy of his legs since his stem cell treatment in 2021.  He experiences significant weakness and instability in his legs and feet, leading to frequent falls, primarily backwards.   Assessment/Plan:  His left side strength was decreased compared to right, especially at bicep, tricep, wrist, and hip flexion.  His hx of multiple myeloma and chemo, radiation could possibly be the cause of his myopathy, he does endorse muscle soreness at  times so we discussed drawing labs to assess any muscle breakdown.  We are awaiting EMG/NCS currently scheduled November 2025 but Dr. Wayman was messaged to see if he can see patient for sooner EMG at [facility]. He has hx of neuropathy but seems to be mostly affected in his feet, especially with burning, tingling.  He is already on cymbalta  and lyrica  for this.    Labs to be drawn today: CK Aldolase  Recommended [employer] [place] for foot neuropathy.   Discussed RTC ~ PRN at this time and they will follow up after EMG results.   Falls risk plan: Patient has assistive device   I spent a total of 75 minutes in both face-to-face and non-face-to-face activities for this visit on the date of this encounter. This included chart review, interpretation of results, counseling, education, ordering meds/tests/procedures, and/or coordination of care and excluding procedures.   I personally performed the service, non-incident to. (WP)    KIMBERLY DARROLL HAGEDORN, NP

## 2024-07-30 NOTE — Progress Notes (Signed)
 ABMT OUTPATIENT NOTE: CAR-T THERAPY  IDENTIFYING STATEMENT:  Bruce Smith is a 73 y.o. male from [place], [place] with the diagnosis of Multiple Myeloma.  Patient's referring physician is Dr. Maude Crease.  Patient's primary ABMT physician is Dr. Darra.  He is status post high dose Melphalan + Autologous stem cell support on 01/22/21.  He is currently receiving Fludarabine/Cyclophosphamide lymph-depleting chemotherapy, to be followed by CAR-T [place]) on 03/27/2024.  Today is Day 124.   CAR-T course: His routine Car-T course involved receiving Fludarabine and Cytoxan as lympho-depleting chemotherapy followed by CAR-T infusion.  His course was complicated by balance alterations and AMS resulting in a fall prior to receiving CAR-T infusion. After workup Levaquin was deemed to be the cause and he was switched to Cefdinir with resolution.  On 6/5 he was admitted for Gr 1 CRS in the form of febrile neutropenia  He received Toci x 1.  During his stay he also experienced Gr2 ICANS with an ICE score of 6/10. He was noted to be lethargic with new confusion and decreased ICE score of 6, unable to write sentence, count backwards, or identify month/city.   He received one 10 mg dose of Decadron  and his symptoms resolved.  Counts remain low currently, but he is still transfusion independent.  He will have his hickman removed prior to discharge.  Follow up has been arranged with his primary team.  HPI:   -Reports mild fatigue, similar as compared to prior, as well as mild petechial rash of the distal bilateral lower extremities; otherwise, no new symptoms. Creatinine remains mildly elevated at 1.5. Transaminases remain mildly elevated; AST 44 and ALT 60.   INTERVAL HISTORY 05/01/24 Day 34 post CAR T: History of Present Illness - He experiences aching and throbbing pain in his legs and feet, described as 'tingly and needly.' This pain has persisted since his treatment for multiple myeloma, which included stem cell therapy,  chemotherapy, radiation, and CAR T-cell therapy. The leg pain is associated with muscle tightness, particularly when standing, and he describes his muscles as 'very rigid.' No recent muscle spasms, but he mentions a past phase of cramping. -He has tried medications such as Neurontin, gabapentin, and Cymbalta , which have provided some relief. Currently, he takes tramadol  four times a day, every six hours, and has been off Tylenol  for over thirty days following CAR T-cell therapy. Previously, he was taking two 500 mg doses of Tylenol  four times a day. -He reports low energy levels and occasional neck pain over the past few days. He experienced a single moderate headache recently, but does not usually suffer from headaches. No recent confusion or lethargy, although he had a previous neurologic complication. He is able to identify the current month and city correctly. His signature appears normal. No new tremor. - He had nausea yesterday that responded to taking Compazine . - No fever, cough, congestion, shortness of breath, chest pain, diarrhea, constipation, nausea, or vomiting. He also denies any other joint pain or back pain, noting that his pain is primarily in his legs.  INTERVAL HISTORY 05/07/24 Day 41 post CAR T: - He experienced a fall last Thursday in his closet, losing balance when the sliding door moved unexpectedly, resulting in a bruise on his right upper arm and a significant bruise on his back. No dizziness or lightheadedness was noted at the time of the fall. - He has aching, throbbing pains in his legs and feet, along with muscle stiffness. He takes Tylenol , tramadol , and tizanidine for pain management,  which helps alleviate the pain, though the muscle relaxant causes sleepiness. He manages the pain effectively at home with these medications. - He experiences occasional nausea, present about half the time, managed with Zofran . If Zofran  is ineffective, he uses Compazine , which is more effective  but causes dry mouth and increased tiredness. - He mentions a recent alert from his phone indicating an elevated heart rate, reaching up to 100 bpm once. He is not currently on metoprolol , which he previously took to regulate his heart rate. His current heart rate is around 88 bpm.  - He is on several medications, including Diflucan, Famvir , Bactrim DS, and Keppra , which was recently discontinued. He also takes magnesium  as needed. He has been instructed to restart cefdinir due to a low neutrophil count. - No headaches, congestion, cough, sore throat, fever, chills, shaking, tremors, or seizures. Energy levels are medium, allowing him to perform routine activities around the house. He walks short distances, such as from his house to the garage. His appetite is slightly off, with weight fluctuating from 199 lbs last week to 196 lbs this week, though he is not overly concerned about this change.  INTERVAL HISTORY 05/15/24 Day 48 post CAR T: - He experiences nausea primarily in the mornings, often after eating breakfast, which has led to vomiting. He has vomited twice since the last visit here last week, with the vomitus described as undigested food with a yellowish bile stain. The episodes occur suddenly without prior feelings of nausea. He takes Zofran  as needed for nausea, but not more than once a day, and finds it somewhat helpful. - He feels off balance and experiences dizziness, particularly when standing up. He uses a rolling walker for support and has noticed increased difficulty with balance over the past week. His legs feel weak and fatigue quickly, requiring him to sit down after standing for 15-20 minutes. He experiences numbness and tingling in his feet, particularly on the left side. He takes tizanidine, a muscle relaxant, which provides some relief but also causes drowsiness. Muscle aches are present in his thighs, more pronounced on the left side, and he has a history of needing to lift his left  leg more due to weakness. This symptom has worsened since CAR T-cell therapy. - No falls, cough, congestion, fevers, or chills. No significant changes in appetite or taste, and he eats regularly.  INTERVAL HISTORY 05/22/24 Day 55 post CAR T: - He experiences daily nausea, requiring Zofran  and Compazine  for relief. Despite taking Protonix , nausea persists but has not led to recent vomiting. He feels 'wiped out' and more tired than usual, with reduced energy levels. - He has balance issues and lightheadedness, with symptoms fluctuating daily. Some days he feels off balance, while on others he does not. He spends significant time in a recliner. - He reports muscle weakness and soreness primarily in his legs, thighs, and calves. His legs are sore to the touch, and he has difficulty standing and walking. He uses tramadol  and Tylenol  for pain relief, but they provide minimal relief. He also applies CBD oil, which helps more than Biofreeze. He has a history of falling and reports muscle weakness. 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 He has clearing petechiae on his ankles and feet. He has a history of a cracked scapula and bruising on his left shoulder from a fall a few weeks ago.  - No fevers, chills, night sweats, congestion, runny nose, sore throat, vomiting, dizziness, vision  changes, or significant headaches. He reports mild headaches that resolve on their own. He has an appetite, though not at full capacity.  INTERVAL HISTORY 05/29/24 Day 62 post CAR T: -His neutrophil count remains at 0.8, which is below the desired level of 1.0. This count has been stable over the past few weeks, with previous counts of 0.8 last week and 0.7 the week before. He is receiving weekly injections to help increase his neutrophil count. - He experiences weakness and soreness in his legs and feet, with neuropathy affecting both areas. He has difficulty standing up from a squat  position without assistance. He has recently started physical therapy, which has increased his leg pain. He fell on Saturday night, resulting in a major bruise on his right side and arm, with some skin peeling. He attributes the fall to unsteadiness and difficulty with balance, particularly when turning. - He experienced a single episode of severe nausea and vomiting yesterday, described as 'projectile' and involving green bile. He is currently taking Protonix  40 mg twice daily before meals to manage stomach acid. He reports feeling better after the vomiting episode and has not had recurrent nausea. - He continues to experience a stinging sensation due to neuropathy in his feet. No increased headaches, dizziness, vision changes, fevers, congestion, sore throat, or cough.  INTERVAL HISTORY 06/07/2024 - day 71 post CAR-T: -He experiences falls, with two incidents occurring recently, resulting in bruises but no cuts or head injuries. His legs and feet give way, particularly when turning. He is attending physical therapy but has not noticed significant improvement yet. He uses a walker but fell while trying to retrieve it. His wife assists him with daily activities. - He experiences persistent soreness in his legs and feet, with no significant change in pain levels. His left hip flexion is limited. He reports one instance of increased pain after physical therapy, which resolved after rest. - He experiences nausea without vomiting, which is managed with medication. He has diarrhea a couple of times a day but has not required regular use of Imodium . - He has a new puppy, a miniature Dachshund, which brings joy but requires careful handling to avoid scratches or bites due to his condition. His wife ensures hygiene by wiping the puppy's paws and using alcohol wipes if scratched. Is aware to wear gloves for protection.  - No lightheadedness, dizziness, cough, congestion, shortness of breath, confusion, or  forgetfulness beyond ordinary levels.  INTERVAL HISTORY 06/12/24:  -  He again fell this past weekend after bending over to pickup an item. He has extensive bruising behind his left elbow and his left shoulder posteriorly from falling backwards. - He is undergoing PT twice weekly. The other night he took 2 Tramadol  due to tenderness from his fall and after PT .  He feels he has suboptimal pain control as is experiencing ongoing soreness. -  He is still having some nausea that responds to medication. - Often feels  off balance with any change in position. - No cough, congestion , fever, diarrhea.  No headaches. INTERVAL HISTORY 06/26/24 DAY  90 post CAR T: - He has been experiencing elevated blood pressure for the past 2 weeks, with readings ranging from 162/61 to 185/95 mmHg. He has not yet contacted his cardiologist, as he was waiting for this appointment. He does not have any anti-HTN medications on hold. - His mobility has been declining, with significant difficulty in walking. He has been undergoing physical therapy, but his condition is worsening.  He now requires a transport chair for longer distances and struggles to lift his legs, particularly the left one. His family and caregiver have noticed these changes, and physical therapy is making him sore without noticeable improvement. - He reports shoulder and neck pain, particularly on the left side where he has previously fallen. The pain is described as throbbing and aching, similar to a toothache, and it is affecting his sleep. He has not fallen recently, but the pain persists and is worsening. -  His white blood cell count has decreased, with neutrophils dropping to 0.2 today. His hemoglobin is stable, and his platelet count has improved.  - No recent falls, but he reports shoulder and neck pain, difficulty lifting his legs, and worsening mobility.  - No changes in hand movements or tremors at rest. No noticeable change in mentation.  INTERVAL  HISTORY 07/16/24 Day 110  post CAR T: - He experiences significant muscle weakness, primarily on the left side, leading to recurrent falls. His left leg occasionally 'just shuts off,' causing unexpected falls. The most recent fall occurred over two weeks ago, resulting in bruises on the left shoulder and scapula. He has 'good days and bad days' and cannot walk far before needing to sit down. - He has been undergoing physical therapy, which he is about to resume after a break. He received injections in both hips about a week ago but reports no noticeable improvement in his symptoms. - He experiences frequent loose stools, occurring at least once a day but no more than three times a day. He also reports nocturia, needing to urinate every hour to hour and a half at night, disrupting his sleep. He has been taking Flomax  (tamsulosin ) and recently adjusted the timing of his dose. - He reports still a decrease in appetite and has experienced weight loss, dropping from 191 lbs to 182 lbs. He gets full quickly and does not eat as much as he used to. He occasionally consumes protein shakes. - He has a history of low white blood cell counts, with the most recent count showing a total of 1.6 and neutrophils at 0.2. He is on antibiotics due to the low counts and receives monthly pentamidine breathing treatments. - No cough, congestion, fevers, chills, headaches, dizziness, or sore throat. Ongoing leg pain and bruising on the left side from falls. Ongoing frequent nocturia and loose stools.   INTERVAL HISTORY 07/30/24 Day 124 post CAR T: - Continued weakness, had a fall last week at PT. The R foot turned over and he fell. It was a controlled fall and he was not injured. Usually this is on the L but this time the R. Since he was released from CAR T he has had falls nearly every week, but now they're more controlled. Goes to outpatient OT/PT - not sure this is helping but hasn't been able to go as much due to having so many  doctor's appointments. He has appointments every day of the week. - Able to do transfers to use the restroom. Goes out on the porch to enjoy the outdoors but wife worries about him so not as much. He has been out to eat some, watches Gunsmoke. Friends and family come over to visit.  - The puppy is a wild puppy, but she is also a sweet dog. She is a small dog named Tessie June - He is eating better and his diarrhea stopped about 2 weeks ago. Drinking protein supplements. Nuri Chocolate is his preferred. Following with nutrition  at [facility]. - Some dizziness a while back but none today. - Continues with neuropathic pain in the legs which began with velcade . Taking 8-500 mg Tylenol  tabs a day + tramadol . - No fevers, chills, no other concerns.   CURRENT MEDICATIONS: Current Outpatient Medications  Medication Sig Dispense Refill  . ACETAMINOPHEN  ORAL Take 1,000 mg by mouth 4 (four) times daily    . [Paused] aspirin  81 MG EC tablet Take 81 mg by mouth once daily    . calcium  carbonate-vitamin D3 (CALTRATE 600+D) 600 mg-5 mcg (200 unit) tablet Take 1 tablet by mouth 2 (two) times daily with meals    . cefdinir (OMNICEF) 300 mg capsule TAKE 1 CAPSULE BY MOUTH EVERY 12 HOURS FOR 30 DAYS 60 capsule 1  . cloNIDine HCL (CATAPRES) 0.1 MG tablet Take 0.1 mg by mouth    . DULoxetine  (CYMBALTA ) 60 MG DR capsule Take 1 capsule (60 mg total) by mouth once daily 30 capsule 3  . famciclovir  (FAMVIR ) 500 MG tablet TAKE 1 TABLET BY MOUTH 2 TIMES A DAY 60 tablet 2  . fluconazole (DIFLUCAN) 200 MG tablet Take 2 tablets (400 mg total) by mouth once daily for 60 days. Begin with LD therapy and stop when ANC > 1000.    SABRA loratadine (CLARITIN) 10 mg tablet Take 1 tablet (10 mg total) by mouth once daily    . LORazepam  (ATIVAN ) 1 MG tablet Take 1 mg by mouth 3 (three) times daily    . magnesium  oxide (MAG-OX) 400 mg (241.3 mg magnesium ) tablet Take 400 mg by mouth once daily    . metoprolol  SUCCinate (TOPROL -XL) 25 MG XL  tablet Take 1 tablet (25 mg total) by mouth once daily 30 tablet 3  . naloxone  (NARCAN ) 4 mg/actuation nasal spray Place 4 mg into one nostril once    . nitroGLYcerin  (NITROSTAT ) 0.4 MG SL tablet Place 0.4 mg under the tongue every 5 (five) minutes as needed for Chest pain May take up to 3 doses.    . ondansetron  (ZOFRAN ) 8 MG tablet Take 1 tablet (8 mg total) by mouth every 8 (eight) hours as needed for Nausea or Vomiting    . pantoprazole  (PROTONIX ) 40 MG DR tablet Take 40 mg by mouth 2 (two) times daily before meals    . pentamidine (NEBUPENT) 300 mg solution for nebulization Take 6 mLs (300 mg total) by nebulization every 28 (twenty-eight) days    . polyethylene glycol (MIRALAX ) packet Take 17 g by mouth once daily Mix in 4-8ounces of fluid prior to taking.    . pregabalin  (LYRICA ) 100 MG capsule Take 1 capsule (100 mg total) by mouth 2 (two) times daily 60 capsule 2  . prochlorperazine  (COMPAZINE ) 5 MG tablet Take 1 tablet (5 mg total) by mouth every 6 (six) hours as needed for nausea or vomiting 40 tablet 2  . rosuvastatin  calcium  (ROSUVASTATIN  ORAL) Take by mouth    . tamsulosin  (FLOMAX ) 0.4 mg capsule Take 1 capsule (0.4 mg total) by mouth once daily 30 capsule 2  . tiZANidine (ZANAFLEX) 2 MG capsule Take 1 capsule (2 mg total) by mouth 2 (two) times daily 60 capsule 0  . traMADoL  (ULTRAM ) 50 mg tablet Take 1 tablet (50 mg total) by mouth every 6 (six) hours as needed for pain (Patient taking differently: Take 50 mg by mouth 4 (four) times daily) 90 tablet 1   No current facility-administered medications for this visit.    ALLERGIES:  No Known Allergies  Allergies  Allergen Reactions  . Corticosteroids (Glucocorticoids) Other (See Comments)    This patient has or will receive CAR-T Cell Therapy, a type of cancer immunotherapy that uses a patient's own T cells to attack cancer cells and should only receive corticosteroids in specific clinical situations.  It is recommended that  corticosteroids be avoided prior to cell infusion and at least 6 months after CAR-T Cell Therapy has been given.  If you have questions, please page the [phone] attending [phone]).   ROS:  Constitutional: No fevers. No rigors/chills.  No night sweats. + weight loss.    Eyes: No vision changes.  No dry eyes.  No eye pain.  Ears, nose, mouth, throat: No epistaxis.  No oral pain.  No oral lesions/erythema.  No odynophagia/dysphagia. Hematologic/lymphatic: No bruising or adenopathy. Cardiovascular: No chest pain.  No edema.  Gastrointestinal: No N/V.  Had diarrhea after PET/CT contrast.  No constipation. No blood in stool.  No hematemesis. Genitourinary: No dysuria.  No hematuria.  No urinary frequency/urgency.  Integumentary: No rash.  No skin lesions. CVC site, no drainage or tenderness Neurologic: + Focal weakness with left hip flexion. Increasing  weakness and difficulty straightening legs bilaterally. No numbness.  No tingling.  Often off balance. Psychiatric: No anxiety.  No depression. Endocrine: No diabetes.  No thyroid  disease. Respiratory: No cough.  No dypsnea.  No hemoptysis. Musculoskeletal: + Ongoing thigh muscle pain and left scapular pain.  Allergic/immunologic: See Allergy section. KPS: 70%  PHYSICAL EXAM: There were no vitals filed for this visit.   Wt Readings from Last 3 Encounters:  07/29/24 81.6 kg (180 lb)  07/16/24 83.4 kg (183 lb 13.8 oz)  07/15/24 82.8 kg (182 lb 8 oz)    General: Male.  Sitting in recliner, in no acute distress.  HEENT: Benjamin Perez/AT. PERRL, EOMI, sclera anicteric.  Oral mucosa pink, moist.  No oral lesions.  Lungs: Clear to auscultation bilaterally.  Respirations regular. No audible W/R/R. Heart: Regular rate and regular rhythm. S1 and S2 present. No audible murmur, rub, or gallop.    Chest: Normal.   Abdomen: Active bowel sounds throughout. Soft, non distended, non tender. No palpable hepatosplenomegaly, or masses. Extremities: Slight firm edema at  both ankles.  No cyanosis or edema in the upper extremities.  Skin: Warm, dry and intact.  Resolving bruising on left back/side.  Neurologic: +  Reduced strength 3/5 with left hip flexion, 4/5 right hip flexion. 4/5 bilaterally with right and left leg extension. Alert and oriented x 3. Cranial nerves II -XII intact.   LABS: Results for orders placed or performed in visit on 07/29/24  Creatine Kinase (CK), Total  Result Value Ref Range   CK, Total (Creatine Kinase, Total) 45 (L) 60 - 270 U/L    TOXICITIES: - None currently  ABMT PROBLEM LIST: 1.  Kappa Light Chain Myeloma.             a.  Underwent cervical spine surgery via an anterior approach for cervical spondylosis on 06/17/20.              bSABRA Rasmussen on 06/26/20 and injured his left shoulder and plain films revealed a comminuted mid scapular fracture as well as an erosive lesion in the 4th left rib and lucencies in the left proximal humerus.               c. CT of the head revealed lytic lesions in the skull; CT of the cervical spine revealed post op changes and lytic lesions in  the T1 vertebral body; CT of the chest, abdomen and pelvis revealed an expansile lytic lesion in the left scapula with a fracture, a lytic lesion in the lateral left 4th rib with an associated soft tissue mass, multiple lytic lesions the right scapula, bilateral ribs and T11 vertebral body and numerous lytic lesions through out the lumbar spine and bony pelvis.               d. 06/29/20: no M-spike on SPEP; IgG 527 mg/dL, IgA 848 mg/dL and IgM 11 mg/dLl serum free kappa light chain level 15.47 mg/dL, lambda 9.30 mg/dL and ratio 77.57; LDH 849 U/L (98-192); hemoglobin 13.8 g/dL, creatinine 1.6 mg/dL, calcium  12.8 mg/dL; albumin  4.0 g/dL; received Xgeva .             e. PET/CT scan on 07/07/20 revealed multifocal hypermetabolic lytic bone lesions in the spine, ribs, humeri, both femurs including a large destructive lesion in the left proximal femur at risk for pathologic  fracture and all areas of the pelvis.               f. 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 Underwent open reduction and internal fixation of the left femur lesion on 07/14/20 and pathology revealed a plasma cell neoplasm.               h. Bone marrow on 07/28/20 revealed 30-40% plasma cells and a small clonal B-cell population.              i. Treated with radiation therapy to his left shoulder and ribs, upper sacrum and left femur.              j. Started on dara-RVd on 08/11/20; serum free kappa light chain level 7.29 mg/dL, lambda 9.52 mg/dL and ratio 84.48.               k. Course was complicated by nausea, urinary retention and severe constipation.               l. 10/06/20: serum free kappa light chain level 0.98 mg/dL, lambda 9.52 mg/dL and ratio 7.90.              m. Revlimid  was stopped on 10/13/20.             n. PET/CT scan on 10/16/20 revealed significant interval improvement with no new lesions.               o. Bone marrow on 10/19/20 was hypocellular with no excess plasma cells.               p. 11/03/20: serum  free kappa light chain level 0.82 mg/dL, lambda 9.70 mg/dL and ratio 7.16.              r. Fell on 11/16/20 and plain films revealed a lytic lesion in the proximal left fibula with a fracture along the medial aspect of the lesion.             s. 11/18/20: 0.08 g/dL M-spike; faint IgG kappa on IFE; serum free kappa light chain level 0.64 mg/dL, lambda 9.76 mg/dL and ratio 7.21.             t. 12/02/20: bone marrow was 5-10% cellular with no plasma cells and 3% monotypic B cells on flow;  limited FISH  panel was negative for 17P and 11q abnormalities; cytogenetics were normal; echocardiogram revealed LVEF >55% with normal wall motion and GLS -14.8%; ECG revealed NSR with QTc 396 msec; PFTs revealed FVC 98%, FEV1 90% and DLCO 56.8%; chest x-ray revealed a consolidative opacity in the left  lung apex with architectural distortion; left tib-fib films revealed a lucent lesion in the proximal left fibula with a nondisplaced fracture with evidence of healing.                u. Started G-CSF on 12/12/20 and had a Hickman catheter placed on 12/15/20 and received Mozobil that evening; underwent apheresis on 12/16/20 with collection of 3.62 x 10e6 CD34 cells/kg; received Mozobil again that evening and underwent apheresis on 12/17/20 with collection of 1.89 x 10e6 CD34 cells/kg.               v. Chest CT on 12/17/20 revealed a left apical bandlike consolidative opacity and traction bronchiectasis and multifocal osseous lesions.             w. 12/21/20: no M-spike on SPEP; faint IgG kappa on IFE; serum free kappa light chain level 0.42 mg/dL, lambda 9.80 mg/dL and ratio 7.78. x. 96/77/77: no M-spike on SPEP; faint IgG kappa on IFE; serum free kappa light chain level 0.47 mg/dL, lambda  9.76 mg/dL and ratio 7.95.             y. High dose melphalan 200 mg/m2 followed by autologous stem cell infusion on 01/22/21.             z. 04/05/21: 0.02 g/dL M-spike; faint IgG kappa on IFE; IgG 225 mg/dL; serum free kappa light chain level 0.35 mg/dL, lambda 9.72 mg/dL and ratio 8.69.               aa. 05/13/21: started daratumumab  maintenance.             ab. 05/17/21: bone marrow normocellular with no plasma cells and normal cytogenetics.             ac. 06/11/21: <0.1 g/dL M-spike; IgG kappa on IFE; IgG 255 mg/dL; serum free kappa light chain level 0.42 mg/dL, lambda 9.80 mg/dL and ratio 7.78.              ad. 07/07/21: IgG 220 mg/dL; serum free kappa light chain level 0.37 mg/dL, lambda 9.77 mg/dL and ratio 8.31.             ae. 08/19/2020: No M-spike; serum free kappa light chain level .49, lambda .21, and ratio 2.3.             af. 09/16/2021: No M-spike; IgG level 250mg /dL serum free kappa light chain level .43, lambda .21, and ratio 2.05.             ag. 10/26/2021: No M-spike; IgG level 330 mg/dL; serum  free kappa light chain level 0.79, lambda 0.45, and ratio 1.67.             ah. 11/24/2021:  No M-Spike; IFE revealed IgG monoclonal protein with kappa light chain specificity; IgG level 365 mg/dL; serum free kappa light chain level .67, lambda .62, and ratio 1.08.             ai. 11/26/2021: No M-Spike; UPEP was unremarkable. Evidence of monoclonal protein was not apparent.             aj. 12/22/2021: No M-Spike; IgG level 303 mg/dL; serum free kappa light chain level .55, lambda .39,  and ratio 1.41             ak. 01/05/2022: No M-Spike; IgG kappa on IFE; IgG level 288 mg/dL; serum free kappa light chain level 0.64, lambda .65, and ratio 0.98.             al. 01/14/22: MRI Pelvis/Left femur/lumbar spine: Overall, the numerous pelvis lesions appear decreased in size from prior. Some small prior bilateral iliac lesions are no longer visualized. There is interval decrease in enhancement of the lesions with areas of central non enhancement now suggesting necrosis. Status post ORIF of the proximal left femur. The prior bone lesion and associated edema from pathologic fracture within the distal left femoral neck and superior left greater trochanter is overall smaller, with interval decrease in the marrow edema suggesting interval healing of the fracture. Edema deep to the left-greater-than-right iliacus muscles appears similar to prior. There is again fluid extending into the right iliopsoas bursa from the anterior superior right femoroacetabular joint fluid, measuring up to 2.6 cm similar to prior. No new compression fracture. There is increased canal and left foraminal stenosis at L2-L3.             am. 07/13/22: No M-Spike; IgG kappa on IFE; IgG level 273 mg/dL; serum free kappa light chain level 0.65 mg/dL, lambda 9.63 mg/dL and ratio 8.18.             an. 08/03/22: serum free kappa light chain level 0.54 mg/dL, lambda 9.66 mg/dL and ratio 8.35. ao. PET scan on 08/18/22 revealed a new areas of hypermetabolism at  T12, the left iliac bone and the left mid humerus.   ap. Treated with 2500 cGy to the left humerus, T spine and left pelvis from 11/20-12/05/23.  aq. 11/29/22: serum free kappa light chain level 0.45 mg/dL, lambda 9.68 mg/dL and ratio 8.54. ar. PET scan on 12/12/22 revealed a new hypermetabolic lytic lesion in the right femoral head. as. 12/27/22: serum free kappa light chain level 0.49 mg/dL, lambda 9.66 mg/dL and ratio 8.51. at. 96/98/75: 24 hour urine contained 88 mg of protein (100% albumin ) with no M-spike on UPEP or IFE; free kappa light chain level 2.03 mg/L, lambda <0.69 mg/L and ratio >2.94. au. Treated with 2400 cGy to the right pelvis from 03/07-03/18/24. av. 01/23/23: 0.04 g/dL M-spike; IgG kappa on IFE; serum free kappa light chain level 0.49 mg/dL, lambda 9.69 mg/dL and ratio 8.36. aw. 93/80/75: 0.1 g/dL M-spike; IgG kappa on IFE; IgG 312 mg/dL; serum free kappa light chain level 0.88 mg/dL, lambda 9.26 mg/dL and ratio 8.78.   ax. PET/CT on 05/09/23 revealed a new area of focal hypermetabolism along the anterior aspect of T9 with mild corresponding lucencies on CT, resolution of previously noted uptake in the right femoral head and scattered lytic and sclerotic bone lesions with no uptake. ay. 05/16/23: no M-spike on SPEP or IFE; serum free kappa light chain level 0.82 mg/dL, lambda 9.39 mg/dL and ratio 8.62. az. 91/94/75: bone marrow revealed no excess plasma cells with a CD5/CD19 positive B cell population on flow.   ba. Treated with 2400 cGy to his thoracic spine from 08/06-08/19/24. bb. PET/CT scan on 09/26/23 revealed new hypermetabolic lesions at C6, L5, the proximal left femoral diaphysis and the right paraspinal region and a hypermetabolic left hilar lymph node with no pulmonary lesions. bc. Started on carfilzomib , pomalidomide  and dexamethasone  on 10/19/23. bd. 10/23/23: no M-spike on SPEP or IFE; serum free kappa light chain level 1.88 mg/dL, lambda 9.66 mg/dL and  ratio  5.70. be. 11/30/23: serum free kappa light chain level 1.26 mg/dL, lambda 9.62 mg/dL and ratio 6.58. bf. 97/72/74: underwent apheresis with subsequent successful manufacture of Carykti carT cells. bg. 01/01/24: serum free kappa light chain level 1.78 mg/dL, lambda 9.36 mg/dL and ratio 7.16. bh. 95/97/74: serum free kappa light chain level 2.45 mg/dL, lambda 9.08 mg/dL and ratio 7.30; received IVIG that day. bi. PET/CT scan on 02/01/24 (compared to 09/25/24) revealed an increase in the size and activity of a hypermetabolic prevascular/AP window mass up to 4.1 x 3.2 cm (1.9 x 1.8 cm previously) with SUV 55, a new 0.7 cm LUL nodule with SUV 3.3, a new 0.7 cm RLL nodule and subtle soft tissue between the spine and the right psoas muscle with stable SUV 11, a new ventral peritoneal nodule with SUV 13.8, a lytic lesion in L5 with SUV 16 (7.1 previously), increased activity in the right pedicle of L5 and cortical activity along the shaft of the left femoral prosthesis with SUV 49 (17.5 previously).  bj. Bone marrow on 02/02/24 was insufficient for diagnosis.  bk. Last dose of carfilzomib  on 01/15/24; started back on pomalidomide  on 01/30/24.   bl. 02/12/24: no M-spike on SPEP; faint IgG kappa on IFE; IgG 851 mg/dL; serum free kappa light chain level 4.40 mg/dL, lambda 8.32 mg/dL and ratio 7.36. bm. 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 Bn. PET/CT on 06/26/24 : IMPRESSION: 1.  Interval complete resolution of focal bony lesions with no new hypermetabolic bony lesions.  2.  No evidence of extramedullary disease with resolved extramedullary disease above and below the diaphragm.  3.  Interval resolution of bilateral pulmonary infection/inflammatory changes. 4.  Increased uptake about the distal femoral hardware, which may be suggestive of post-treatment inflammatory change versus loosening. Recommend attention on follow-up.   Bl. 06/26/24 BMBX with Clonoseq and Myeloid NGS: MRD  negative. Myeloid NGS did not detect any pathogenic variants.  2. History of CAD with MI (Stents x 2).             a. Admitted to the hospital on 03/08/23 with a myocardial infarction and underwent a three vessel CABG on 03/13/23.     3. BPH             a. Started Flomax  in early 02/22  DISPOSITION: DISEASE: Kappa Light Chain Myeloma - High dose Melphalan 200 mg/m2 followed by autologous stem cell infusion on 01/22/21. - He is currently receiving Fludarabine/Cyclophosphamide lymph-depleting chemotherapy, to be followed by CAR-T [place]) on 03/27/2024.   CELLULAR THERAPY: Preparative regimen:   - Fludarabine 30 mg/m (65 mg) IV: Day -5 through Day -2. - Cyclophosphamide 300 mg/m (650 mg) IV: Day -5 through Day -2.  Stem Cells:   - CAR-T cell reinfusion on 03/28/24.  Was scheduled for 5/28, but patient had fall and AMS.  Supportive Care: - Transfuse prn hgb <=8gm/dl and plts <=89X. No transfusions today.  - 6/27: mild petechiae over anterior aspect of distal lower extremities bilaterally, present x4-5 days per pt. Perhaps mild progression over past few days. Plts starting to uptrend; optimistic for improvement as plts increase.    - 05/08/24 stable petechiae. Plts stable at 35.  - 05/22/2024 clearing petechiae. Platelets stable at 37.  -  Outside labs 06/21/24 WBC 2.2, Hgb 10.0, plt ct 54, ANC 1.1.              -  06/26/24 labs: WBC 1.0, Hgb 9.9, Plt ct 59, ANC  0.2.  - 07/16/24: WBC 1.6, ANC 0.2,  Hgb 11.2, plt ct 61.  CAR-T Toxicities (date/grade): - Grade 1 CRS; Toci x 1 - Grade 2 ICANS; Dex 10 mg x 1 for ICE 6/10  INFECTIOUS DISEASE Prophylaxis:  - Bacterial: Cefdinir 300 mg po q 12 hrs. continue through ANC >1000. (levaquin discontinued as possible cause of AMS). If ANC >1000  could discontinue. D/c on 06/12/24. Resumed on 06/27/24 for ANC 0.2. - Viral: Famciclovir  500mg  po BID will continue for 6 months s/p CAR-T treatment (can then d/c if CD4 >200). - Fungal: Fluconazole 400mg   daily will continue through ANC >1000.  If ANC >1000 could discontinue.  D/C on 06/12/24. Resumed on 06/27/24 for ANC 0.2. - PCP: Bactrim DS 1 tab po Mon/Wed/Fri will continue for 6 months s/p CAR-T treatment (can then d/c if CD4 >200).  On 05/08/2024 discontinued Bactrim with concern for ongoing pancytopenia.  Same day initiated pentamadine treatments to continue every 28 days. Given 06/07/2024.  Next due on 07/05/24. Administered on 07/16/24.  Next due around 08/13/24.  Febrile neutropenia: 6/5: Admitted with fever 102.2. Was on Vanc for SSTI at CVC site, cefepime  initiated. FFWU negative and remained afebrile therefore transitioned back to cefdinir and vanc d/c'd. Toci x 1 given for likely CRS  Weekly Monitoring: CMV: 04/08/24 - 06/26/24: negative  CARDIAC:  CAD (Hx of CABG/Stents): - Current Regimen: Toprol -XL 25 mg daily + ASA 81 mg daily. ON HOLD with decreased BP and plts <50K.  Could restart ASA if plts remain above 50K.   Angina: - Current Regimen: Nitro 0.4 mg every 5 minutes PRN  GI: N/V Prophylaxis:  - Patient currently has Compazine  and Zofran  as needed for breakthrough symptoms.  - 6/9: IV Zofran  given in clinic x1 on 04/08/24.  Will allow for Ativan  oral to be given at night. - 6/10: nausea improved.   GERD: - Current Regimen: Protonix  40 mg 2x daily.  Probiotic Supplementation: - Current Regimen: None.  Patient will hold Florastor during acute CAR-T process.  GU:  BPH:  - Current Regimen: Flomax  0.4 mg daily--holding currently--restarted 04/14/24  Cr elevation: - 6/27: Cr 1.5 today; baseline pre-transplant 1.2. Cr has been 1.4-1.7 since 6/5. No decreased PO to suggest pre-renal physiology 6/27. Reported possible mild urinary obstructive sxs occurring ~6/20-6/24; bladder scan 6/27 0 cc (measured shortly after pt voided). 6/27 UA normal. As such, Cr elevation possibly attributable to bactrim.  - Consider transition from bactrim to atovaquone if Cr rises in future. Currently stable at  1.5. - Bactrim discontinued due to concern for ongoing pancytopenia.  Creatinine improved to 1.4. - Creatinine now further improved at 1.3 - 1.0.  CRS MONITORING: - Patient underwent a ROS as above. - Patient's vital signs were monitored, including BP and O2 sats.   NEURO: Peripheral Neuropathy: - Current Regimen: Lyrica  200 mg 2x daily + Cymbalta  60 mg daily  AMS, Hallucinations - Onset 5/27 after LD chemo prior to CAR-T cells. Head CT 5/28 NAICP. MRI 5/28 ordered, cancelled on 5/29 given improvement in AMS - 6/5: lethargy and confusion on admission, ICE 6/10, unable to write sentence, count backwards or identify month/city. Dex 10 mg x 1 given for gd 2 ICANS with improvement to 10/10.    Weakness, Balance Deficits Fall - 5/27: s/p fall in shower.  - 5/28:  Head CT 5/28 NAICP. Infectious w/u 5/28 including BCx x2 NGTD.   Seizure Prophylaxis: - Patient will continue on Keppra  500mg  po Q12hrs through Day +30 (can then  d/c if no seizure activity).  Dc'd 6/27 (D+29 PM dose; day of [facility] from [facility]). Discontinued.  Neurotoxicity Monitoring: -Having increasing lower extremity muscle stiffness  MSK:  Generalized Pain: - Current Regimen: Tramadol  50 mg 4x daily PRN.  Added Tylenol  500 mg up to twice daily and tizanidine muscle relaxant 2 mg once daily with interval improvement. - Increased tizanidine to twice daily.  Patient aware that this increases fatigue. - For left hip flexion weakness, referring him for PT local to his residence. - Still with myalgias and muscle stiffness in legs.  Continuing Tramadol .  Started PT on 7/29.  Will continue next week 2 times per week. - Worsening leg weakness and stiffness as of 06/26/24.  Requesting Neuro consult,  MRI brain, MRI total spine. ALC has remained low. May need LP dependent on MRI findings. PET/CT noting loosening/ inflamed left distal femoral hardware.  - Evaluated by Dr. Redell Seltzer at [facility]  on 07/15/24. His exam with notable findings including LUE and LLE>RLE weakness without isolated parkinsonism--his impairment of RAMs is driven by weakness, and there is no rigidity, tremor, shuffling gait, or other quality to implicate parkinsonism. It appears that weakness is the driving factor for his motor changes. Recommended to continue PT, start OT, obtain EMG/NCS, and follow-up with neuromusuclar medicine.   Hypomagnesemia: - Current Regimen: Magnesium  oxide 400 mg daily with food.  Hypocalcemia: - Current Regimen: Calcium  + Vitamin D3: 1 tablet 2x daily with food.  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 -  05/01/24 IgG 417. -  05/29/24 IgG 300. - 06/12/24  IVIG administered.  FOLLOW-UP: -  Hold Reluko given recovered ANC. Continuing prophy with Cefdinir and Diflucan. CBC next week with local physician. -   Neuro consult as above noting impairment related to weakness recommending PT/OT, EMG/NGS.  Will keep f/u MRI brain. MRI total spine without a lesion suggesting etiology. Had Orthopedic office visit with hip injection and notes minimal benefit. -  Will continue pentamidine nebulizer therapy every 28 days for PJP prophy. Treatment administered on 07/16/24. Due next on 08/13/24. - IVIG administered on 06/12/24 for IgG level below 400. - Day 90 IR BMBX on 06/26/24 with MRD with ClonoSeq is MRD negative. Myeloid NGS did not detect any pathogenic variants. Restaging PET/CT on 06/26/24  noting interval complete resolution of focal bony lesions with no new hypermetabolic bony lesions c/w disease response. -- Liver injury noted and d/w patient. Hold tylenol . Trial 2.5 mg oxicodone Q12H PRN. CMP next week with local physician. -- In 2 weeks f/u with [facility] of care reviewed with and formulated by Dr. Rayfield.  Thank you for including me in this patient's care. Please see the attending attestation from Dr. Rayfield below.     Suzen Meeter, M.D.  Hematology and Oncology Fellow III    Future  Appointments  Date Time Provider Department Center  07/30/2024  8:30 AM Rayfield Boroughs, MD [facility]  07/30/2024  9:00 AM Krystal Crank, RN [facility]  09/04/2024 10:30 AM [facility]     CONTACT INFO: Patient's cell: [phone] (M)   ATTENDING NOTE  July 30, 2024   INTERVAL HISTORY:  Bruce Smith is a 73 y.o. year old male with multiple myeloma s/p CAR T cell therapy now day 124. Had a tough course post CAR T cell therapy. His appetite and energy level recently improved. Saw neurology yesterday. Continue Famiciclovir and pentamidine. Repeat labs next week with  local oncologist office. Follow up with Tanya in 2 weeks. Patient knows to call if any questions or concerns.   Yubin Kang, MD Professor of Medicine Hematologic Malignancies and Cell Therapy [phone]   ATTESTATION:   Attestation Statement:   I personally saw and evaluated the patient, and participated in the management and treatment plan as documented in the resident/fellow note.  Yubin Kang, MD

## 2024-08-06 ENCOUNTER — Inpatient Hospital Stay: Attending: Hematology & Oncology

## 2024-08-06 ENCOUNTER — Other Ambulatory Visit: Payer: Self-pay

## 2024-08-06 ENCOUNTER — Inpatient Hospital Stay

## 2024-08-06 ENCOUNTER — Telehealth: Payer: Self-pay

## 2024-08-06 VITALS — BP 137/77 | HR 71 | Temp 97.8°F | Resp 18

## 2024-08-06 DIAGNOSIS — C9000 Multiple myeloma not having achieved remission: Secondary | ICD-10-CM

## 2024-08-06 DIAGNOSIS — Z5189 Encounter for other specified aftercare: Secondary | ICD-10-CM | POA: Insufficient documentation

## 2024-08-06 DIAGNOSIS — C9001 Multiple myeloma in remission: Secondary | ICD-10-CM | POA: Insufficient documentation

## 2024-08-06 DIAGNOSIS — D701 Agranulocytosis secondary to cancer chemotherapy: Secondary | ICD-10-CM | POA: Insufficient documentation

## 2024-08-06 DIAGNOSIS — R7989 Other specified abnormal findings of blood chemistry: Secondary | ICD-10-CM | POA: Insufficient documentation

## 2024-08-06 DIAGNOSIS — Z923 Personal history of irradiation: Secondary | ICD-10-CM | POA: Insufficient documentation

## 2024-08-06 DIAGNOSIS — C419 Malignant neoplasm of bone and articular cartilage, unspecified: Secondary | ICD-10-CM

## 2024-08-06 LAB — CMP (CANCER CENTER ONLY)
ALT: 133 U/L — ABNORMAL HIGH (ref 0–44)
AST: 45 U/L — ABNORMAL HIGH (ref 15–41)
Albumin: 4.2 g/dL (ref 3.5–5.0)
Alkaline Phosphatase: 120 U/L (ref 38–126)
Anion gap: 13 (ref 5–15)
BUN: 30 mg/dL — ABNORMAL HIGH (ref 8–23)
CO2: 24 mmol/L (ref 22–32)
Calcium: 9.6 mg/dL (ref 8.9–10.3)
Chloride: 97 mmol/L — ABNORMAL LOW (ref 98–111)
Creatinine: 1.14 mg/dL (ref 0.61–1.24)
GFR, Estimated: >60 mL/min (ref >60)
Glucose, Bld: 103 mg/dL — ABNORMAL HIGH (ref 70–99)
Potassium: 5 mmol/L (ref 3.5–5.1)
Sodium: 133 mmol/L — ABNORMAL LOW (ref 135–145)
Total Bilirubin: 0.3 mg/dL (ref 0.0–1.2)
Total Protein: 6.4 g/dL — ABNORMAL LOW (ref 6.5–8.1)

## 2024-08-06 MED ORDER — PEGFILGRASTIM-CBQV 6 MG/0.6ML ~~LOC~~ SOSY
6.0000 mg | PREFILLED_SYRINGE | Freq: Once | SUBCUTANEOUS | Status: AC
  Administered 2024-08-06: 6 mg via SUBCUTANEOUS
  Filled 2024-08-06: qty 0.6

## 2024-08-06 NOTE — Patient Instructions (Signed)

## 2024-08-06 NOTE — Telephone Encounter (Signed)
 Critical result received from lab of ANC 0.3  DR. Ennever aware and orders in place for patient to get GCSF injection.Pt and wife aware of result and need for injection. Pt stated he will return to clinic for injection. Appointment made and pt in route for injection

## 2024-08-07 LAB — CBC WITH DIFFERENTIAL (CANCER CENTER ONLY)
Abs Immature Granulocytes: 0.01 K/uL (ref 0.00–0.07)
Basophils Absolute: 0 K/uL (ref 0.0–0.1)
Basophils Relative: 1 %
Eosinophils Absolute: 0 K/uL (ref 0.0–0.5)
Eosinophils Relative: 2 %
HCT: 34.8 % — ABNORMAL LOW (ref 39.0–52.0)
Hemoglobin: 12 g/dL — ABNORMAL LOW (ref 13.0–17.0)
Immature Granulocytes: 1 %
Lymphocytes Relative: 18 %
Lymphs Abs: 0.3 K/uL — ABNORMAL LOW (ref 0.7–4.0)
MCH: 35.2 pg — ABNORMAL HIGH (ref 26.0–34.0)
MCHC: 34.5 g/dL (ref 30.0–36.0)
MCV: 102.1 fL — ABNORMAL HIGH (ref 80.0–100.0)
Monocytes Absolute: 0.9 K/uL (ref 0.1–1.0)
Monocytes Relative: 57 %
Neutro Abs: 0.3 K/uL — CL (ref 1.7–7.7)
Neutrophils Relative %: 21 %
Platelet Count: 67 K/uL — ABNORMAL LOW (ref 150–400)
RBC: 3.41 MIL/uL — ABNORMAL LOW (ref 4.22–5.81)
RDW: 13.3 % (ref 11.5–15.5)
Smear Review: NORMAL
WBC Count: 1.5 K/uL — ABNORMAL LOW (ref 4.0–10.5)
nRBC: 0 % (ref 0.0–0.2)

## 2024-08-14 ENCOUNTER — Inpatient Hospital Stay (HOSPITAL_BASED_OUTPATIENT_CLINIC_OR_DEPARTMENT_OTHER): Admitting: Hematology & Oncology

## 2024-08-14 ENCOUNTER — Inpatient Hospital Stay

## 2024-08-14 ENCOUNTER — Encounter: Payer: Self-pay | Admitting: Hematology & Oncology

## 2024-08-14 VITALS — BP 133/63 | HR 72 | Temp 97.8°F | Resp 17 | Wt 177.0 lb

## 2024-08-14 DIAGNOSIS — M25512 Pain in left shoulder: Secondary | ICD-10-CM

## 2024-08-14 DIAGNOSIS — C419 Malignant neoplasm of bone and articular cartilage, unspecified: Secondary | ICD-10-CM

## 2024-08-14 DIAGNOSIS — C9001 Multiple myeloma in remission: Secondary | ICD-10-CM | POA: Diagnosis not present

## 2024-08-14 DIAGNOSIS — C9000 Multiple myeloma not having achieved remission: Secondary | ICD-10-CM

## 2024-08-14 LAB — CBC WITH DIFFERENTIAL (CANCER CENTER ONLY)
Abs Immature Granulocytes: 0.23 K/uL — ABNORMAL HIGH (ref 0.00–0.07)
Basophils Absolute: 0 K/uL (ref 0.0–0.1)
Basophils Relative: 0 %
Eosinophils Absolute: 0 K/uL (ref 0.0–0.5)
Eosinophils Relative: 0 %
HCT: 36.6 % — ABNORMAL LOW (ref 39.0–52.0)
Hemoglobin: 12.1 g/dL — ABNORMAL LOW (ref 13.0–17.0)
Immature Granulocytes: 2 %
Lymphocytes Relative: 3 %
Lymphs Abs: 0.4 K/uL — ABNORMAL LOW (ref 0.7–4.0)
MCH: 34.6 pg — ABNORMAL HIGH (ref 26.0–34.0)
MCHC: 33.1 g/dL (ref 30.0–36.0)
MCV: 104.6 fL — ABNORMAL HIGH (ref 80.0–100.0)
Monocytes Absolute: 1.1 K/uL — ABNORMAL HIGH (ref 0.1–1.0)
Monocytes Relative: 9 %
Neutro Abs: 10.4 K/uL — ABNORMAL HIGH (ref 1.7–7.7)
Neutrophils Relative %: 86 %
Platelet Count: 52 K/uL — ABNORMAL LOW (ref 150–400)
RBC: 3.5 MIL/uL — ABNORMAL LOW (ref 4.22–5.81)
RDW: 13.6 % (ref 11.5–15.5)
WBC Count: 12.2 K/uL — ABNORMAL HIGH (ref 4.0–10.5)
nRBC: 0 % (ref 0.0–0.2)

## 2024-08-14 LAB — CMP (CANCER CENTER ONLY)
ALT: 295 U/L (ref 0–44)
AST: 133 U/L — ABNORMAL HIGH (ref 15–41)
Albumin: 4.2 g/dL (ref 3.5–5.0)
Alkaline Phosphatase: 194 U/L — ABNORMAL HIGH (ref 38–126)
Anion gap: 13 (ref 5–15)
BUN: 26 mg/dL — ABNORMAL HIGH (ref 8–23)
CO2: 26 mmol/L (ref 22–32)
Calcium: 10.2 mg/dL (ref 8.9–10.3)
Chloride: 99 mmol/L (ref 98–111)
Creatinine: 1.35 mg/dL — ABNORMAL HIGH (ref 0.61–1.24)
GFR, Estimated: 55 mL/min — ABNORMAL LOW (ref >60)
Glucose, Bld: 86 mg/dL (ref 70–99)
Potassium: 5.2 mmol/L — ABNORMAL HIGH (ref 3.5–5.1)
Sodium: 137 mmol/L (ref 135–145)
Total Bilirubin: 0.3 mg/dL (ref 0.0–1.2)
Total Protein: 6.3 g/dL — ABNORMAL LOW (ref 6.5–8.1)

## 2024-08-14 LAB — LACTATE DEHYDROGENASE: LDH: 458 U/L — ABNORMAL HIGH (ref 98–192)

## 2024-08-14 NOTE — Progress Notes (Signed)
 Critical result received from lab of ALT 295. Dr. Timmy aware and no new orders received; pt not to receive any infusions today.

## 2024-08-14 NOTE — Progress Notes (Signed)
 Hematology and Oncology Follow Up Visit  Bruce Smith [id number] 1951/02/24 73 y.o. 08/14/2024   Principle Diagnosis:  Kappa light chain myeloma-extensive bone disease-pathologic fracture of left scapula and left femur - t(11:14), 13q-, 1p-,1q-  Past Therapy: Status post surgical repair of left femur-07/14/2020 XRT to left shoulder and left hip Xgeva  120 mg subcu every 3 months-next dose in December 2022  Faspro/Velcade /Revlimid /Decadron  -- s/p cycle #2 -- start on       08/06/2020  ( Revlimid  is 14 on /14 off)    Current Therapy:        ASCT -- [facility] on -01/22/2021 Faspro - q month -- maintenance -- start on 05/13/2021 -- d/c on 10/10/2023 Zometa  4 mg IV every 3 months-next dose in 02/2024  Radiation therapy to T12, left iliac and left humerus --completed on 10/04/2022 --XRT to right femoral head -start 01/03/2023 XRT to T9 lesion -to be completed on 06/18/2023 Kyprolis /Pomalyst  -- s/p cycle #3 - start on 10/19/2023 IVIG 40 g monthly Carvykti -  given at [facility] on 03/27/2024    Interim History:  Bruce Smith is here today for follow-up.  He still having problems neurologically.  He still having problems with walking.  He does use a rolling walker.  He recently was seen at [facility].  He had MRIs done.  This was done of the complete spine.  This showed that he had areas that had been involved with myeloma.  There was some compression fracture at T9.  He had degenerative changes.  He has some spinal stenosis.  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  He had a MRI of the brain which showed some cranial lesions but nothing within the brain parenchyma.  He was found to have some elevated LFTs today.  I am unsure as to why he had these LFT elevations.  His SGPT was 295 and SGOT 133.  Alkaline phosphatase 194.  His bilirubin was normal at 0.3.  The LDH was on the high side at 458.    I will have to see about getting a ultrasound of his liver.  I want to see was going on with that.  I  am not sure why he would have the elevated LFTs unless he has some kind of viral type syndrome.  He has had no bleeding.  He has had no bowel or bladder incontinence.  He has had a decent appetite.  He has had no nausea or vomiting.  There is been no cough.  He has had no chest pain.  He has had no shortness of breath.  Overall, his wife thinks he does have good amount of fatigue.  Currently, I will say that his performance status is probably ECOG 2.    Medications:  Allergies as of 08/14/2024   No Known Allergies      Medication List        Accurate as of August 14, 2024 10:02 AM. If you have any questions, ask your nurse or doctor.          acetaminophen  500 MG tablet Commonly known as: TYLENOL  Take 2 tablets (1,000 mg total) by mouth 4 (four) times daily.   aspirin  EC 81 MG tablet Take 81 mg by mouth at bedtime.   benzonatate  200 MG capsule Commonly known as: TESSALON  Take 1 capsule (200 mg total) by mouth 3 (three) times daily as needed for cough.   CALCIUM  600 PO Take 600 mg by mouth daily.   cloNIDine 0.1 MG tablet  Commonly known as: CATAPRES Take 0.1 mg by mouth.   DULoxetine  60 MG capsule Commonly known as: CYMBALTA  Take 1 capsule (60 mg total) by mouth daily.   famciclovir  250 MG tablet Commonly known as: FAMVIR  TAKE 1 TABLET BY MOUTH EVERY DAY   fluconazole 200 MG tablet Commonly known as: DIFLUCAN Take 400 mg by mouth daily.   guaiFENesin  600 MG 12 hr tablet Commonly known as: MUCINEX  Take 1 tablet (600 mg total) by mouth 2 (two) times daily as needed for to loosen phlegm.   levETIRAcetam  500 MG tablet Commonly known as: KEPPRA  TAKE 1 TABLET BY MOUTH 2 TIMES A DAY   loperamide  2 MG tablet Commonly known as: IMODIUM  A-D Take 2 mg by mouth 4 (four) times daily as needed for diarrhea or loose stools.   loratadine 10 MG dissolvable tablet Commonly known as: CLARITIN REDITABS Take 10 mg by mouth daily.   LORazepam  1 MG tablet Commonly  known as: ATIVAN  Take 1 mg by mouth 3 (three) times daily.   Magnesium  250 MG Caps Take 250 mg by mouth daily. What changed: how much to take   metoprolol  succinate 50 MG 24 hr tablet Commonly known as: TOPROL -XL Take 50 mg by mouth daily. What changed: how much to take   naloxone  4 MG/0.1ML Liqd nasal spray kit Commonly known as: NARCAN  Administer as directed in each nostril in the event of an overdose. May repeat in 15 minutes.   nitroGLYCERIN  0.4 MG SL tablet Commonly known as: NITROSTAT  Place 0.4 mg under the tongue every 5 (five) minutes as needed.   ondansetron  8 MG tablet Commonly known as: Zofran  Take 1 tablet (8 mg total) by mouth every 8 (eight) hours as needed for nausea or vomiting.   pantoprazole  40 MG tablet Commonly known as: PROTONIX  TAKE 1 TABLET BY MOUTH 2 TIMES A DAY BEFORE MEALS FOR ACID REFLUX   polyethylene glycol powder 17 GM/SCOOP powder Commonly known as: GLYCOLAX /MIRALAX  Take 17 g by mouth daily.   pregabalin  100 MG capsule Commonly known as: LYRICA  TAKE 1 CAPSULE BY MOUTH 2 TIMES DAILY   prochlorperazine  10 MG tablet Commonly known as: COMPAZINE  Take 1 tablet (10 mg total) by mouth every 6 (six) hours as needed for nausea or vomiting.   rosuvastatin  40 MG tablet Commonly known as: CRESTOR  TAKE 1 TABLET BY MOUTH EVERY DAY FOR CHOLESTEROL   saccharomyces boulardii 250 MG capsule Commonly known as: FLORASTOR Take 250 mg by mouth daily.   tamsulosin  0.4 MG Caps capsule Commonly known as: FLOMAX  TAKE 1 CAPSULE BY MOUTH EVERY NIGHT AT BEDTIME   tiZANidine 2 MG tablet Commonly known as: ZANAFLEX Take 2 mg by mouth 2 (two) times daily.   traMADol  50 MG tablet Commonly known as: ULTRAM  TAKE 2 TABLETS BY MOUTH EVERY 6 HOURS AS NEEDED FOR PAIN What changed: how much to take        Allergies: No Known Allergies  Past Medical History, Surgical history, Social history, and Family History were reviewed and updated.  Review of  Systems: Review of Systems  Constitutional: Negative.   HENT: Negative.    Eyes: Negative.   Respiratory: Negative.    Cardiovascular: Negative.   Gastrointestinal: Negative.   Genitourinary: Negative.   Musculoskeletal:  Positive for joint pain.  Skin: Negative.   Neurological: Negative.   Endo/Heme/Allergies: Negative.   Psychiatric/Behavioral: Negative.       Physical Exam:  weight is 177 lb (80.3 kg). His oral temperature is 97.8 F (36.6 C). His  blood pressure is 133/63 and his pulse is 72. His respiration is 17 and oxygen saturation is 97%.   Wt Readings from Last 3 Encounters:  08/14/24 177 lb (80.3 kg)  07/03/24 189 lb 6.4 oz (85.9 kg)  06/21/24 190 lb (86.2 kg)    Physical Exam Vitals reviewed.  Constitutional:      Comments: This is a well-developed and well-nourished white male in no obvious distress.  He has a nicely healed median sternotomy scar.  HENT:     Head: Normocephalic and atraumatic.  Eyes:     Pupils: Pupils are equal, round, and reactive to light.  Cardiovascular:     Rate and Rhythm: Normal rate and regular rhythm.     Heart sounds: Normal heart sounds.  Pulmonary:     Effort: Pulmonary effort is normal.     Breath sounds: Normal breath sounds.  Abdominal:     General: Bowel sounds are normal.     Palpations: Abdomen is soft.  Musculoskeletal:        General: No tenderness or deformity. Normal range of motion.     Cervical back: Normal range of motion.     Comments: There is pain to palpation in the left hip area.  This is in the lateral hip area.  He has some decreased range of motion.  Lymphadenopathy:     Cervical: No cervical adenopathy.  Skin:    General: Skin is warm and dry.     Findings: No erythema or rash.  Neurological:     Mental Status: He is alert and oriented to person, place, and time.  Psychiatric:        Behavior: Behavior normal.        Thought Content: Thought content normal.        Judgment: Judgment normal.       Lab Results  Component Value Date   WBC 12.2 (H) 08/14/2024   HGB 12.1 (L) 08/14/2024   HCT 36.6 (L) 08/14/2024   MCV 104.6 (H) 08/14/2024   PLT 52 (L) 08/14/2024   Lab Results  Component Value Date   FERRITIN 135 11/15/2023   IRON 35 (L) 11/15/2023   TIBC 225 (L) 11/15/2023   UIBC 190 11/15/2023   IRONPCTSAT 16 (L) 11/15/2023   Lab Results  Component Value Date   RETICCTPCT 1.3 11/14/2023   RBC 3.50 (L) 08/14/2024   Lab Results  Component Value Date   KPAFRELGTCHN <0.7 (L) 07/03/2024   LAMBDASER <1.5 (L) 07/03/2024   KAPLAMBRATIO UNABLE TO CALCULATE 07/03/2024   Lab Results  Component Value Date   IGGSERUM 465 (L) 07/03/2024   IGGSERUM 524 (L) 07/03/2024   IGA <5 (L) 07/03/2024   IGA 9 (L) 07/03/2024   IGMSERUM <5 (L) 07/03/2024   IGMSERUM <5 (L) 07/03/2024   Lab Results  Component Value Date   TOTALPROTELP 5.4 (L) 07/03/2024   ALBUMINELP 3.2 07/03/2024   A1GS 0.2 07/03/2024   A2GS 0.8 07/03/2024   BETS 0.7 07/03/2024   GAMS 0.4 07/03/2024   MSPIKE Not Observed 07/03/2024     Chemistry      Component Value Date/Time   NA 133 (L) 08/06/2024 1304   K 5.0 08/06/2024 1304   CL 97 (L) 08/06/2024 1304   CO2 24 08/06/2024 1304   BUN 30 (H) 08/06/2024 1304   CREATININE 1.14 08/06/2024 1304      Component Value Date/Time   CALCIUM  9.6 08/06/2024 1304   ALKPHOS 120 08/06/2024 1304   AST 45 (H)  08/06/2024 1304   ALT 133 (H) 08/06/2024 1304   BILITOT 0.3 08/06/2024 1304       Impression and Plan: Mr. Keady is a very pleasant 73 yo gentleman with 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   He finally got to [facility] and went through the CAR-T protocol.  He was out there for 40 days.  Again, he had quite a few toxicities from the Carvykti.  Again, I just hate that is having these continued neurological issues.  I would like to hope that they will improve.  Again my concern right now is the elevated liver  functions.  We will see what the ultrasound of the liver shows.  I cannot see any obvious medications that he is on that would do this.  I do not think is anything new that would be doing this.  He is really not symptomatic with the increased LFTs.  Thankfully, there is no elevated bilirubin.  He is not can get IVIG today.  I just think that would be little bit too much for him.  We will go ahead and plan to get the ultrasound either today or tomorrow.  I will then plan to get him back to see me in another 3-4 weeks.   Maude JONELLE Crease, MD 10/15/202510:02 AM

## 2024-08-15 ENCOUNTER — Other Ambulatory Visit: Payer: Self-pay

## 2024-08-15 LAB — KAPPA/LAMBDA LIGHT CHAINS
Kappa free light chain: <0.7 mg/L — ABNORMAL LOW (ref 3.3–19.4)
Kappa, lambda light chain ratio: UNDETERMINED
Lambda free light chains: <1.5 mg/L — ABNORMAL LOW (ref 5.7–26.3)

## 2024-08-15 LAB — IGG, IGA, IGM
IgA: <5 mg/dL — ABNORMAL LOW (ref 61–437)
IgG (Immunoglobin G), Serum: 399 mg/dL — ABNORMAL LOW (ref 603–1613)
IgM (Immunoglobulin M), Srm: <5 mg/dL — ABNORMAL LOW (ref 15–143)

## 2024-08-16 ENCOUNTER — Ambulatory Visit (HOSPITAL_BASED_OUTPATIENT_CLINIC_OR_DEPARTMENT_OTHER)

## 2024-08-16 ENCOUNTER — Ambulatory Visit: Payer: Self-pay | Admitting: Hematology & Oncology

## 2024-08-16 ENCOUNTER — Ambulatory Visit (HOSPITAL_BASED_OUTPATIENT_CLINIC_OR_DEPARTMENT_OTHER)
Admission: RE | Admit: 2024-08-16 | Discharge: 2024-08-16 | Disposition: A | Discharge status: Home / Self Care | Source: Ambulatory Visit | Attending: Hematology & Oncology | Admitting: Hematology & Oncology

## 2024-08-16 DIAGNOSIS — C9000 Multiple myeloma not having achieved remission: Secondary | ICD-10-CM | POA: Diagnosis present

## 2024-08-16 NOTE — Telephone Encounter (Signed)
-----   Message from Maude JONELLE Crease sent at 08/16/2024 12:16 PM EDT ----- Please call and let him know that the ultrasound looks fine.  His liver looks fine.  There is no blockages.  There is no gallstones.  Bruce Smith ----- Message ----- From: Rebecka, Rad Results In Sent: 08/16/2024  12:10 PM EDT To: Maude JONELLE Crease, MD

## 2024-08-16 NOTE — Telephone Encounter (Signed)
 Advised via MyChart.

## 2024-08-18 ENCOUNTER — Other Ambulatory Visit: Payer: Self-pay

## 2024-08-20 NOTE — Progress Notes (Signed)
 ABMT OUTPATIENT NOTE: CAR-T THERAPY  IDENTIFYING STATEMENT:  Bruce Smith is a 73 y.o. male from [place], [place] with the diagnosis of Multiple Myeloma.  Patient's referring physician is Dr. Maude Smith.  Patient's primary ABMT physician is Dr. Darra.  He is status post high dose Melphalan + Autologous stem cell support on 01/22/21.  He is currently receiving Fludarabine/Cyclophosphamide lymph-depleting chemotherapy, to be followed by CAR-T [place]) on 03/27/2024.  Today is Day 145.   CAR-T course: His routine Car-T course involved receiving Fludarabine and Cytoxan as lympho-depleting chemotherapy followed by CAR-T infusion.  His course was complicated by balance alterations and AMS resulting in a fall prior to receiving CAR-T infusion. After workup Levaquin was deemed to be the cause and he was switched to Cefdinir with resolution.  On 6/5 he was admitted for Gr 1 CRS in the form of febrile neutropenia  He received Toci x 1.  During his stay he also experienced Gr2 ICANS with an ICE score of 6/10. He was noted to be lethargic with new confusion and decreased ICE score of 6, unable to write sentence, count backwards, or identify month/city.   He received one 10 mg dose of Decadron  and his symptoms resolved.  Counts remain low currently, but he is still transfusion independent.  He will have his hickman removed prior to discharge.  Follow up has been arranged with his primary team.  HPI:   -Reports mild fatigue, similar as compared to prior, as well as mild petechial rash of the distal bilateral lower extremities; otherwise, no new symptoms. Creatinine remains mildly elevated at 1.5. Transaminases remain mildly elevated; AST 44 and ALT 60.   INTERVAL HISTORY 05/01/24 Day 34 post CAR T: History of Present Illness - He experiences aching and throbbing pain in his legs and feet, described as 'tingly and needly.' This pain has persisted since his treatment for multiple myeloma, which included stem cell therapy,  chemotherapy, radiation, and CAR T-cell therapy. The leg pain is associated with muscle tightness, particularly when standing, and he describes his muscles as 'very rigid.' No recent muscle spasms, but he mentions a past phase of cramping. -He has tried medications such as Neurontin, gabapentin, and Cymbalta , which have provided some relief. Currently, he takes tramadol  four times a day, every six hours, and has been off Tylenol  for over thirty days following CAR T-cell therapy. Previously, he was taking two 500 mg doses of Tylenol  four times a day. -He reports low energy levels and occasional neck pain over the past few days. He experienced a single moderate headache recently, but does not usually suffer from headaches. No recent confusion or lethargy, although he had a previous neurologic complication. He is able to identify the current month and city correctly. His signature appears normal. No new tremor. - He had nausea yesterday that responded to taking Compazine . - No fever, cough, congestion, shortness of breath, chest pain, diarrhea, constipation, nausea, or vomiting. He also denies any other joint pain or back pain, noting that his pain is primarily in his legs.  INTERVAL HISTORY 05/07/24 Day 41 post CAR T: - He experienced a fall last Thursday in his closet, losing balance when the sliding door moved unexpectedly, resulting in a bruise on his right upper arm and a significant bruise on his back. No dizziness or lightheadedness was noted at the time of the fall. - He has aching, throbbing pains in his legs and feet, along with muscle stiffness. He takes Tylenol , tramadol , and tizanidine for pain management,  which helps alleviate the pain, though the muscle relaxant causes sleepiness. He manages the pain effectively at home with these medications. - He experiences occasional nausea, present about half the time, managed with Zofran . If Zofran  is ineffective, he uses Compazine , which is more effective  but causes dry mouth and increased tiredness. - He mentions a recent alert from his phone indicating an elevated heart rate, reaching up to 100 bpm once. He is not currently on metoprolol , which he previously took to regulate his heart rate. His current heart rate is around 88 bpm.  - He is on several medications, including Diflucan, Famvir , Bactrim DS, and Keppra , which was recently discontinued. He also takes magnesium  as needed. He has been instructed to restart cefdinir due to a low neutrophil count. - No headaches, congestion, cough, sore throat, fever, chills, shaking, tremors, or seizures. Energy levels are medium, allowing him to perform routine activities around the house. He walks short distances, such as from his house to the garage. His appetite is slightly off, with weight fluctuating from 199 lbs last week to 196 lbs this week, though he is not overly concerned about this change.  INTERVAL HISTORY 05/15/24 Day 48 post CAR T: - He experiences nausea primarily in the mornings, often after eating breakfast, which has led to vomiting. He has vomited twice since the last visit here last week, with the vomitus described as undigested food with a yellowish bile stain. The episodes occur suddenly without prior feelings of nausea. He takes Zofran  as needed for nausea, but not more than once a day, and finds it somewhat helpful. - He feels off balance and experiences dizziness, particularly when standing up. He uses a rolling walker for support and has noticed increased difficulty with balance over the past week. His legs feel weak and fatigue quickly, requiring him to sit down after standing for 15-20 minutes. He experiences numbness and tingling in his feet, particularly on the left side. He takes tizanidine, a muscle relaxant, which provides some relief but also causes drowsiness. Muscle aches are present in his thighs, more pronounced on the left side, and he has a history of needing to lift his left  leg more due to weakness. This symptom has worsened since CAR T-cell therapy. - No falls, cough, congestion, fevers, or chills. No significant changes in appetite or taste, and he eats regularly.  INTERVAL HISTORY 05/22/24 Day 55 post CAR T: - He experiences daily nausea, requiring Zofran  and Compazine  for relief. Despite taking Protonix , nausea persists but has not led to recent vomiting. He feels 'wiped out' and more tired than usual, with reduced energy levels. - He has balance issues and lightheadedness, with symptoms fluctuating daily. Some days he feels off balance, while on others he does not. He spends significant time in a recliner. - He reports muscle weakness and soreness primarily in his legs, thighs, and calves. His legs are sore to the touch, and he has difficulty standing and walking. He uses tramadol  and Tylenol  for pain relief, but they provide minimal relief. He also applies CBD oil, which helps more than Biofreeze. He has a history of falling and reports muscle weakness. 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 He has clearing petechiae on his ankles and feet. He has a history of a cracked scapula and bruising on his left shoulder from a fall a few weeks ago.  - No fevers, chills, night sweats, congestion, runny nose, sore throat, vomiting, dizziness, vision  changes, or significant headaches. He reports mild headaches that resolve on their own. He has an appetite, though not at full capacity.  INTERVAL HISTORY 05/29/24 Day 62 post CAR T: -His neutrophil count remains at 0.8, which is below the desired level of 1.0. This count has been stable over the past few weeks, with previous counts of 0.8 last week and 0.7 the week before. He is receiving weekly injections to help increase his neutrophil count. - He experiences weakness and soreness in his legs and feet, with neuropathy affecting both areas. He has difficulty standing up from a squat  position without assistance. He has recently started physical therapy, which has increased his leg pain. He fell on Saturday night, resulting in a major bruise on his right side and arm, with some skin peeling. He attributes the fall to unsteadiness and difficulty with balance, particularly when turning. - He experienced a single episode of severe nausea and vomiting yesterday, described as 'projectile' and involving green bile. He is currently taking Protonix  40 mg twice daily before meals to manage stomach acid. He reports feeling better after the vomiting episode and has not had recurrent nausea. - He continues to experience a stinging sensation due to neuropathy in his feet. No increased headaches, dizziness, vision changes, fevers, congestion, sore throat, or cough.  INTERVAL HISTORY 06/07/2024 - day 71 post CAR-T: -He experiences falls, with two incidents occurring recently, resulting in bruises but no cuts or head injuries. His legs and feet give way, particularly when turning. He is attending physical therapy but has not noticed significant improvement yet. He uses a walker but fell while trying to retrieve it. His wife assists him with daily activities. - He experiences persistent soreness in his legs and feet, with no significant change in pain levels. His left hip flexion is limited. He reports one instance of increased pain after physical therapy, which resolved after rest. - He experiences nausea without vomiting, which is managed with medication. He has diarrhea a couple of times a day but has not required regular use of Imodium . - He has a new puppy, a miniature Dachshund, which brings joy but requires careful handling to avoid scratches or bites due to his condition. His wife ensures hygiene by wiping the puppy's paws and using alcohol wipes if scratched. Is aware to wear gloves for protection.  - No lightheadedness, dizziness, cough, congestion, shortness of breath, confusion, or  forgetfulness beyond ordinary levels.  INTERVAL HISTORY 06/12/24:  -  He again fell this past weekend after bending over to pickup an item. He has extensive bruising behind his left elbow and his left shoulder posteriorly from falling backwards. - He is undergoing PT twice weekly. The other night he took 2 Tramadol  due to tenderness from his fall and after PT .  He feels he has suboptimal pain control as is experiencing ongoing soreness. -  He is still having some nausea that responds to medication. - Often feels  off balance with any change in position. - No cough, congestion , fever, diarrhea.  No headaches. INTERVAL HISTORY 06/26/24 DAY  90 post CAR T: - He has been experiencing elevated blood pressure for the past 2 weeks, with readings ranging from 162/61 to 185/95 mmHg. He has not yet contacted his cardiologist, as he was waiting for this appointment. He does not have any anti-HTN medications on hold. - His mobility has been declining, with significant difficulty in walking. He has been undergoing physical therapy, but his condition is worsening.  He now requires a transport chair for longer distances and struggles to lift his legs, particularly the left one. His family and caregiver have noticed these changes, and physical therapy is making him sore without noticeable improvement. - He reports shoulder and neck pain, particularly on the left side where he has previously fallen. The pain is described as throbbing and aching, similar to a toothache, and it is affecting his sleep. He has not fallen recently, but the pain persists and is worsening. -  His white blood cell count has decreased, with neutrophils dropping to 0.2 today. His hemoglobin is stable, and his platelet count has improved.  - No recent falls, but he reports shoulder and neck pain, difficulty lifting his legs, and worsening mobility.  - No changes in hand movements or tremors at rest. No noticeable change in mentation.  INTERVAL  HISTORY 07/16/24 Day 110  post CAR T: - He experiences significant muscle weakness, primarily on the left side, leading to recurrent falls. His left leg occasionally 'just shuts off,' causing unexpected falls. The most recent fall occurred over two weeks ago, resulting in bruises on the left shoulder and scapula. He has 'good days and bad days' and cannot walk far before needing to sit down. - He has been undergoing physical therapy, which he is about to resume after a break. He received injections in both hips about a week ago but reports no noticeable improvement in his symptoms. - He experiences frequent loose stools, occurring at least once a day but no more than three times a day. He also reports nocturia, needing to urinate every hour to hour and a half at night, disrupting his sleep. He has been taking Flomax  (tamsulosin ) and recently adjusted the timing of his dose. - He reports still a decrease in appetite and has experienced weight loss, dropping from 191 lbs to 182 lbs. He gets full quickly and does not eat as much as he used to. He occasionally consumes protein shakes. - He has a history of low white blood cell counts, with the most recent count showing a total of 1.6 and neutrophils at 0.2. He is on antibiotics due to the low counts and receives monthly pentamidine breathing treatments. - No cough, congestion, fevers, chills, headaches, dizziness, or sore throat. Ongoing leg pain and bruising on the left side from falls. Ongoing frequent nocturia and loose stools.   INTERVAL HISTORY 07/30/24 Day 124 post CAR T: - Continued weakness, had a fall last week at PT. The R foot turned over and he fell. It was a controlled fall and he was not injured. Usually this is on the L but this time the R. Since he was released from CAR T he has had falls nearly every week, but now they're more controlled. Goes to outpatient OT/PT - not sure this is helping but hasn't been able to go as much due to having so many  doctor's appointments. He has appointments every day of the week. - Able to do transfers to use the restroom. Goes out on the porch to enjoy the outdoors but wife worries about him so not as much. He has been out to eat some, watches Gunsmoke. Friends and family come over to visit.  - The puppy is a wild puppy, but she is also a sweet dog. She is a small dog named Bruce Smith - He is eating better and his diarrhea stopped about 2 weeks ago. Drinking protein supplements. Nuri Chocolate is his preferred. Following with nutrition  at [facility]. - Some dizziness a while back but none today. - Continues with neuropathic pain in the legs which began with velcade . Taking 8-500 mg Tylenol  tabs a day + tramadol . - No fevers, chills, no other concerns.   INTERVAL HISTORY 08/13/24 Day 137 post CAR T: - He experienced a fall approximately a week and a half ago in the bathroom, tripping over a piece of wood and landing on his back, right shoulder, and head. No lump or redness was noted on his head. No dizziness, headaches, vision problems, or nausea following the fall. Soreness has persisted since the incident, leading to the cancellation of occupational and physical therapy sessions. He feels unbalanced after the fall and requires time to regain his balance. -He continues to experience neuropathic pain in his legs, which has decreased in intensity from an eight or nine out of ten to a five out of ten. He is currently taking tramadol  every six hours, but finds that taking more than one tablet at a time causes excessive sleepiness. He cannot use Tylenol  due to elevated liver enzymes and ibuprofen  due to low platelet count. Oxycodone  causes agitation and is not well-tolerated. -  No fever, diarrhea, or nausea is present, but he reports increased urination at night without pain or urgency. - A skin lesion on his cheek has worsened over the past week, becoming larger and more noticeable. It is sore to touch and has not  healed. He has not seen a dermatologist recently. - His appetite has been poor.  INTERVAL HISTORY 08/20/24: DAY 145 POST CAR T: Bruce Smith is a 73 year old male with multiple myeloma who presents for follow-up on day 145 post-CAR T therapy.  He feels stronger over the past month and experiences intermittent back pain, which he describes as 'not so bad as it has been.' He has discontinued oxycodone  due to adverse effects and is currently managing pain with tramadol , taking one and a half tablets every six hours, which he finds effective. He is not taking Tylenol  due to liver enzyme concerns.  No fever, chills, chest pain, trouble breathing, diarrhea, or burning sensation during urination. His appetite is 'not great' but not bad, and he has lost a small amount of weight, from 179 pounds to 178 pounds since his last visit. He reports neuropathy in his feet, which affects his steadiness while walking, although he is active and attends physical therapy regularly.  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 He has had an MRI and PET CT scans, with the latter showing no abnormal lesions in August, although recent imaging indicated some spots in the right frontal and occipital areas.  His current medications include tramadol  for pain. He recently received pentamidine and is holding off on Bactrim due to low platelet counts. His recent lab work shows a white blood cell count of 5.1, hemoglobin of 12, and platelet count of 44, which is stable compared to previous counts. His liver enzymes have improved, with AST back to normal and ALT decreasing from 148 to 98.   CURRENT MEDICATIONS: Current Outpatient Medications  Medication Sig Dispense Refill  . calcium  carbonate-vitamin D3 (CALTRATE 600+D) 600 mg-5 mcg (200 unit) tablet Take 1 tablet by mouth 2 (two) times daily with meals    . cloNIDine HCL  (CATAPRES) 0.1 MG tablet Take 0.1 mg by mouth    . DULoxetine  (CYMBALTA ) 60 MG  DR capsule Take 1 capsule (60 mg total) by mouth once daily 30 capsule 3  . famciclovir  (FAMVIR ) 500 MG tablet TAKE 1 TABLET BY MOUTH 2 TIMES A DAY 60 tablet 2  . loratadine (CLARITIN) 10 mg tablet Take 1 tablet (10 mg total) by mouth once daily    . LORazepam  (ATIVAN ) 1 MG tablet Take 1 mg by mouth 3 (three) times daily    . magnesium  oxide (MAG-OX) 400 mg (241.3 mg magnesium ) tablet Take 400 mg by mouth once daily    . metoprolol  SUCCinate (TOPROL -XL) 25 MG XL tablet Take 1 tablet (25 mg total) by mouth once daily 30 tablet 3  . naloxone  (NARCAN ) 4 mg/actuation nasal spray Place 4 mg into one nostril once    . nitroGLYcerin  (NITROSTAT ) 0.4 MG SL tablet Place 0.4 mg under the tongue every 5 (five) minutes as needed for Chest pain May take up to 3 doses.    . ondansetron  (ZOFRAN ) 8 MG tablet Take 1 tablet (8 mg total) by mouth every 8 (eight) hours as needed for Nausea or Vomiting    . pantoprazole  (PROTONIX ) 40 MG DR tablet Take 40 mg by mouth 2 (two) times daily before meals    . pentamidine (NEBUPENT) 300 mg solution for nebulization Take 6 mLs (300 mg total) by nebulization every 28 (twenty-eight) days    . polyethylene glycol (MIRALAX ) packet Take 17 g by mouth once daily Mix in 4-8ounces of fluid prior to taking.    . pregabalin  (LYRICA ) 100 MG capsule Take 1 capsule (100 mg total) by mouth 2 (two) times daily 60 capsule 2  . prochlorperazine  (COMPAZINE ) 5 MG tablet Take 1 tablet (5 mg total) by mouth every 6 (six) hours as needed for nausea or vomiting 40 tablet 2  . tamsulosin  (FLOMAX ) 0.4 mg capsule Take 1 capsule (0.4 mg total) by mouth once daily 30 capsule 2  . traMADoL  (ULTRAM ) 50 mg tablet Take 1 tablet (50 mg total) by mouth every 4 (four) hours as needed for Pain 120 tablet 0  . [Paused] aspirin  81 MG EC tablet Take 81 mg by mouth once daily    . rosuvastatin  calcium  (ROSUVASTATIN  ORAL) Take by mouth  (Patient not taking: Reported on 08/20/2024)    . tiZANidine (ZANAFLEX) 2 MG capsule Take 1 capsule (2 mg total) by mouth 2 (two) times daily (Patient not taking: Reported on 08/20/2024) 60 capsule 0   No current facility-administered medications for this visit.    ALLERGIES:  No Known Allergies  Allergies  Allergen Reactions  . Corticosteroids (Glucocorticoids) Other (See Comments)    This patient has or will receive CAR-T Cell Therapy, a type of cancer immunotherapy that uses a patient's own T cells to attack cancer cells and should only receive corticosteroids in specific clinical situations.  It is recommended that corticosteroids be avoided prior to cell infusion and at least 6 months after CAR-T Cell Therapy has been given.  If you have questions, please page the [phone] attending [phone]).   ROS:  Constitutional: No fevers. No rigors/chills.  No night sweats. + weight loss.    Eyes: No vision changes.  No dry eyes.  No eye pain.  Ears, nose, mouth, throat: No epistaxis.  No oral pain.  No oral lesions/erythema.  No odynophagia/dysphagia. Hematologic/lymphatic: No bruising or adenopathy. Cardiovascular: No chest pain.  No edema.  Gastrointestinal: No N/V.  Had diarrhea after PET/CT contrast.  No constipation. No blood in stool.  No hematemesis. Genitourinary: No dysuria.  No hematuria.  No urinary frequency/urgency.  Integumentary: Crusted appearing right cheek lesion is more prominent. No rash.   Neurologic: + Focal weakness with left hip flexion. Increasing  weakness and difficulty straightening legs bilaterally. No numbness.  No tingling.  Often off balance. Psychiatric: No anxiety.  No depression. Endocrine: No diabetes.  No thyroid  disease. Respiratory: No cough.  No dypsnea.  No hemoptysis. Musculoskeletal: + Ongoing thigh muscle pain and left scapular pain.  Allergic/immunologic: See Allergy section. KPS: 70%  PHYSICAL EXAM: Vitals:   08/20/24 0912 08/20/24 0914  BP:   135/73  Pulse:  69  Resp:  17  Temp: 36.8 C (98.2 F) 36.8 C (98.2 F)  TempSrc:  Oral  SpO2:  99%  Weight:  80.9 kg (178 lb 5.6 oz)  PainSc:    7  PainLoc:  Foot     Wt Readings from Last 3 Encounters:  08/20/24 80.9 kg (178 lb 5.6 oz)  08/13/24 81.2 kg (179 lb 0.2 oz)  07/30/24 82.3 kg (181 lb 7 oz)    General: Male.  Sitting in recliner, in no acute distress.  HEENT: [place]/AT. PERRL, EOMI, sclera anicteric.  Oral mucosa pink, moist.  No oral lesions.  Lungs: Clear to auscultation bilaterally.  Respirations regular. No audible W/R/R. Heart: Regular rate and regular rhythm. S1 and S2 present. No audible murmur, rub, or gallop.    Chest: Normal.   Abdomen: Active bowel sounds throughout. Soft, non distended, non tender. No palpable hepatosplenomegaly, or masses. Extremities: Slight firm edema at both ankles.  No cyanosis or edema in the upper extremities.  Skin: Prominent Right cheek crusty lesion with surrounding telangectasia.    Resolving bruising posterior and inferior to right shoulder. Neurologic: +  Reduced strength 3/5 with left hip flexion, 4/5 right hip flexion. 4/5 bilaterally with right and left leg extension. Alert and oriented x 3. Cranial nerves II -XII intact.   LABS: Results for orders placed or performed in visit on 08/20/24  Lactate Dehydrogenase (LDH)  Result Value Ref Range   Lactate Dehydrogenase (LDH) 371 (H) <248 U/L  Phosphorus  Result Value Ref Range   Phosphorus 3.4 2.5 - 5.2 mg/dL  Magnesium   Result Value Ref Range   Magnesium  2.0 1.8 - 2.9 mg/dL  Comprehensive Metabolic Panel (CMP)  Result Value Ref Range   Sodium 137 136 - 145 mmol/L   Potassium 4.2 3.5 - 5.0 mmol/L   Chloride 103 98 - 107 mmol/L   Carbon Dioxide (CO2) 27 21 - 31 mmol/L   Urea Nitrogen (BUN) 25 (H) 6 - 20 mg/dL   Creatinine 1.2 0.7 - 1.3 mg/dL   Glucose 870 70 - 859 mg/dL   Calcium  9.6 8.6 - 10.3 mg/dL   AST (Aspartate Aminotransferase) 27 13 - 39 U/L   ALT (Alanine  Aminotransferase) 98 (H) 7 - 52 U/L   Bilirubin, Total 0.3 0.3 - 1.0 mg/dL   Alk Phos (Alkaline Phosphatase) 154 (H) 34 - 104 U/L   Albumin  4.1 3.5 - 5.2 g/dL   Protein, Total 6.1 6.0 - 8.3 g/dL   Anion Gap 7 3 - 12 mmol/L   BUN/CREA Ratio 21 6 - 27   Glomerular Filtration Rate (eGFR)  64 mL/min/1.73sq m  Complete Blood Count (CBC) with Differential  Result Value Ref Range   WBC (White Blood Cell Count) 5.1 3.2 - 9.8 x10^9/L   Hemoglobin 12.0 (L) 13.7 - 17.3 g/dL   Hematocrit 64.8 (L) 60.9 - 49.0 %   Platelets 44 (L)  150 - 450 x10^9/L   MCV (Mean Corpuscular Volume) 103 (H) 80 - 98 fL   MCH (Mean Corpuscular Hemoglobin) 35.1 (H) 26.5 - 34.0 pg   MCHC (Mean Corpuscular Hemoglobin Concentration) 34.2 31.5 - 36.3 %   RBC (Red Blood Cell Count) 3.42 (L) 4.37 - 5.74 x10^12/L   RDW-CV (Red Cell Distribution Width) 13.7 11.5 - 14.5 %   NRBC (Nucleated Red Blood Cell Count) 0.00 0 x10^9/L   NRBC % (Nucleated Red Blood Cell %) 0.0 %   MPV (Mean Platelet Volume) 12.5 (H) 7.2 - 11.7 fL   Neutrophil Count 4.2 2.0 - 8.6 x10^9/L   Neutrophil % 82.9 (H) 37 - 80 %   Lymphocyte Count 0.2 (L) 0.6 - 4.2 x10^9/L   Lymphocyte % 4.1 (L) 10 - 50 %   Monocyte Count 0.5 0 - 0.9 x10^9/L   Monocyte % 10.2 0 - 12 %   Eosinophil Count 0.02 0 - 0.70 x10^9/L   Eosinophil % 0.4 0 - 7 %   Basophil Count 0.02 0 - 0.20 x10^9/L   Basophil % 0.4 0 - 2 %   Immature Granulocyte Count 0.10 (H) <=0.06 x10^9/L   Immature Granulocyte % 2.0 (H) <=0.7 %   Immature Platelet Fraction 9.3 (H) 1.6 - 8.5 %    TOXICITIES: - None currently  ABMT PROBLEM LIST: 1.  Kappa Light Chain Myeloma.             a.  Underwent cervical spine surgery via an anterior approach for cervical spondylosis on 06/17/20.              bSABRA Rasmussen on 06/26/20 and injured his left shoulder and plain films revealed a comminuted mid scapular fracture as well as an erosive lesion in the 4th left rib and lucencies in the left proximal humerus.                c. CT of the head revealed lytic lesions in the skull; CT of the cervical spine revealed post op changes and lytic lesions in the T1 vertebral body; CT of the chest, abdomen and pelvis revealed an expansile lytic lesion in the left scapula with a fracture, a lytic lesion in the lateral left 4th rib with an associated soft tissue mass, multiple lytic lesions the right scapula, bilateral ribs and T11 vertebral body and numerous lytic lesions through out the lumbar spine and bony pelvis.               d. 06/29/20: no M-spike on SPEP; IgG 527 mg/dL, IgA 848 mg/dL and IgM 11 mg/dLl serum free kappa light chain level 15.47 mg/dL, lambda 9.30 mg/dL and ratio 77.57; LDH 849 U/L (98-192); hemoglobin 13.8 g/dL, creatinine 1.6 mg/dL, calcium  12.8 mg/dL; albumin  4.0 g/dL; received Xgeva .             e. PET/CT scan on 07/07/20 revealed multifocal hypermetabolic lytic bone lesions in the spine, ribs, humeri, both femurs including a large destructive lesion in the left proximal femur at risk for pathologic fracture and all areas of the pelvis.               f. 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 Underwent open reduction and internal fixation of the left femur lesion on 07/14/20 and pathology revealed a plasma cell neoplasm.  h. Bone marrow on 07/28/20 revealed 30-40% plasma cells and a small clonal B-cell population.              i. Treated with radiation therapy to his left shoulder and ribs, upper sacrum and left femur.              j. Started on dara-RVd on 08/11/20; serum free kappa light chain level 7.29 mg/dL, lambda 9.52 mg/dL and ratio 84.48.               k. Course was complicated by nausea, urinary retention and severe constipation.               l. 10/06/20: serum free kappa light chain level 0.98 mg/dL, lambda 9.52 mg/dL and ratio 7.90.              m. Revlimid  was stopped on 10/13/20.             n.  PET/CT scan on 10/16/20 revealed significant interval improvement with no new lesions.               o. Bone marrow on 10/19/20 was hypocellular with no excess plasma cells.               p. 11/03/20: serum  free kappa light chain level 0.82 mg/dL, lambda 9.70 mg/dL and ratio 7.16.              r. Fell on 11/16/20 and plain films revealed a lytic lesion in the proximal left fibula with a fracture along the medial aspect of the lesion.             s. 11/18/20: 0.08 g/dL M-spike; faint IgG kappa on IFE; serum free kappa light chain level 0.64 mg/dL, lambda 9.76 mg/dL and ratio 7.21.             t. 12/02/20: bone marrow was 5-10% cellular with no plasma cells and 3% monotypic B cells on flow;  limited FISH panel was negative for 17P and 11q abnormalities; cytogenetics were normal; echocardiogram revealed LVEF >55% with normal wall motion and GLS -14.8%; ECG revealed NSR with QTc 396 msec; PFTs revealed FVC 98%, FEV1 90% and DLCO 56.8%; chest x-ray revealed a consolidative opacity in the left lung apex with architectural distortion; left tib-fib films revealed a lucent lesion in the proximal left fibula with a nondisplaced fracture with evidence of healing.                u. Started G-CSF on 12/12/20 and had a Hickman catheter placed on 12/15/20 and received Mozobil that evening; underwent apheresis on 12/16/20 with collection of 3.62 x 10e6 CD34 cells/kg; received Mozobil again that evening and underwent apheresis on 12/17/20 with collection of 1.89 x 10e6 CD34 cells/kg.               v. Chest CT on 12/17/20 revealed a left apical bandlike consolidative opacity and traction bronchiectasis and multifocal osseous lesions.             w. 12/21/20: no M-spike on SPEP; faint IgG kappa on IFE; serum free kappa light chain level 0.42 mg/dL, lambda 9.80 mg/dL and ratio 7.78. x. 96/77/77: no M-spike on SPEP; faint IgG kappa on IFE; serum free kappa light chain level 0.47 mg/dL, lambda  9.76 mg/dL and ratio 7.95.              y. High dose melphalan 200 mg/m2 followed by autologous stem cell infusion on 01/22/21.  z. 04/05/21: 0.02 g/dL M-spike; faint IgG kappa on IFE; IgG 225 mg/dL; serum free kappa light chain level 0.35 mg/dL, lambda 9.72 mg/dL and ratio 8.69.               aa. 05/13/21: started daratumumab  maintenance.             ab. 05/17/21: bone marrow normocellular with no plasma cells and normal cytogenetics.             ac. 06/11/21: <0.1 g/dL M-spike; IgG kappa on IFE; IgG 255 mg/dL; serum free kappa light chain level 0.42 mg/dL, lambda 9.80 mg/dL and ratio 7.78.              ad. 07/07/21: IgG 220 mg/dL; serum free kappa light chain level 0.37 mg/dL, lambda 9.77 mg/dL and ratio 8.31.             ae. 08/19/2020: No M-spike; serum free kappa light chain level .49, lambda .21, and ratio 2.3.             af. 09/16/2021: No M-spike; IgG level 250mg /dL serum free kappa light chain level .43, lambda .21, and ratio 2.05.             ag. 10/26/2021: No M-spike; IgG level 330 mg/dL; serum free kappa light chain level 0.79, lambda 0.45, and ratio 1.67.             ah. 11/24/2021:  No M-Spike; IFE revealed IgG monoclonal protein with kappa light chain specificity; IgG level 365 mg/dL; serum free kappa light chain level .67, lambda .62, and ratio 1.08.             ai. 11/26/2021: No M-Spike; UPEP was unremarkable. Evidence of monoclonal protein was not apparent.             aj. 12/22/2021: No M-Spike; IgG level 303 mg/dL; serum free kappa light chain level .55, lambda .39, and ratio 1.41             ak. 01/05/2022: No M-Spike; IgG kappa on IFE; IgG level 288 mg/dL; serum free kappa light chain level 0.64, lambda .65, and ratio 0.98.             al. 01/14/22: MRI Pelvis/Left femur/lumbar spine: Overall, the numerous pelvis lesions appear decreased in size from prior. Some small prior bilateral iliac lesions are no longer visualized. There is interval decrease in enhancement of the lesions with areas of central non  enhancement now suggesting necrosis. Status post ORIF of the proximal left femur. The prior bone lesion and associated edema from pathologic fracture within the distal left femoral neck and superior left greater trochanter is overall smaller, with interval decrease in the marrow edema suggesting interval healing of the fracture. Edema deep to the left-greater-than-right iliacus muscles appears similar to prior. There is again fluid extending into the right iliopsoas bursa from the anterior superior right femoroacetabular joint fluid, measuring up to 2.6 cm similar to prior. No new compression fracture. There is increased canal and left foraminal stenosis at L2-L3.             am. 07/13/22: No M-Spike; IgG kappa on IFE; IgG level 273 mg/dL; serum free kappa light chain level 0.65 mg/dL, lambda 9.63 mg/dL and ratio 8.18.             an. 08/03/22: serum free kappa light chain level 0.54 mg/dL, lambda 9.66 mg/dL and ratio 8.35. ao. PET scan on 08/18/22 revealed a new areas of hypermetabolism at  T12, the left iliac bone and the left mid humerus.   ap. Treated with 2500 cGy to the left humerus, T spine and left pelvis from 11/20-12/05/23.  aq. 11/29/22: serum free kappa light chain level 0.45 mg/dL, lambda 9.68 mg/dL and ratio 8.54. ar. PET scan on 12/12/22 revealed a new hypermetabolic lytic lesion in the right femoral head. as. 12/27/22: serum free kappa light chain level 0.49 mg/dL, lambda 9.66 mg/dL and ratio 8.51. at. 96/98/75: 24 hour urine contained 88 mg of protein (100% albumin ) with no M-spike on UPEP or IFE; free kappa light chain level 2.03 mg/L, lambda <0.69 mg/L and ratio >2.94. au. Treated with 2400 cGy to the right pelvis from 03/07-03/18/24. av. 01/23/23: 0.04 g/dL M-spike; IgG kappa on IFE; serum free kappa light chain level 0.49 mg/dL, lambda 9.69 mg/dL and ratio 8.36. aw. 93/80/75: 0.1 g/dL M-spike; IgG kappa on IFE; IgG 312 mg/dL; serum free kappa light chain level 0.88 mg/dL, lambda 9.26  mg/dL and ratio 8.78.   ax. PET/CT on 05/09/23 revealed a new area of focal hypermetabolism along the anterior aspect of T9 with mild corresponding lucencies on CT, resolution of previously noted uptake in the right femoral head and scattered lytic and sclerotic bone lesions with no uptake. ay. 05/16/23: no M-spike on SPEP or IFE; serum free kappa light chain level 0.82 mg/dL, lambda 9.39 mg/dL and ratio 8.62. az. 91/94/75: bone marrow revealed no excess plasma cells with a CD5/CD19 positive B cell population on flow.   ba. Treated with 2400 cGy to his thoracic spine from 08/06-08/19/24. bb. PET/CT scan on 09/26/23 revealed new hypermetabolic lesions at C6, L5, the proximal left femoral diaphysis and the right paraspinal region and a hypermetabolic left hilar lymph node with no pulmonary lesions. bc. Started on carfilzomib , pomalidomide  and dexamethasone  on 10/19/23. bd. 10/23/23: no M-spike on SPEP or IFE; serum free kappa light chain level 1.88 mg/dL, lambda 9.66 mg/dL and ratio 4.29. be. 98/69/74: serum free kappa light chain level 1.26 mg/dL, lambda 9.62 mg/dL and ratio 6.58. bf. 97/72/74: underwent apheresis with subsequent successful manufacture of Carykti carT cells. bg. 01/01/24: serum free kappa light chain level 1.78 mg/dL, lambda 9.36 mg/dL and ratio 7.16. bh. 95/97/74: serum free kappa light chain level 2.45 mg/dL, lambda 9.08 mg/dL and ratio 7.30; received IVIG that day. bi. PET/CT scan on 02/01/24 (compared to 09/25/24) revealed an increase in the size and activity of a hypermetabolic prevascular/AP window mass up to 4.1 x 3.2 cm (1.9 x 1.8 cm previously) with SUV 55, a new 0.7 cm LUL nodule with SUV 3.3, a new 0.7 cm RLL nodule and subtle soft tissue between the spine and the right psoas muscle with stable SUV 11, a new ventral peritoneal nodule with SUV 13.8, a lytic lesion in L5 with SUV 16 (7.1 previously), increased activity in the right pedicle of L5 and cortical activity along the  shaft of the left femoral prosthesis with SUV 49 (17.5 previously).  bj. Bone marrow on 02/02/24 was insufficient for diagnosis.  bk. Last dose of carfilzomib  on 01/15/24; started back on pomalidomide  on 01/30/24.   bl. 02/12/24: no M-spike on SPEP; faint IgG kappa on IFE; IgG 851 mg/dL; serum free kappa light chain level 4.40 mg/dL, lambda 8.32 mg/dL and ratio 7.36. bm. 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 Bn. PET/CT on 06/26/24 : IMPRESSION: 1.  Interval complete resolution of focal bony lesions with no new hypermetabolic bony lesions. 2.  No evidence of extramedullary  disease with resolved extramedullary disease above and below the diaphragm.  3.  Interval resolution of bilateral pulmonary infection/inflammatory changes. 4.  Increased uptake about the distal femoral hardware, which may be suggestive of post-treatment inflammatory change versus loosening. Recommend attention on follow-up.   Bo. 06/26/24 BMBX with Clonoseq and Myeloid NGS: MRD negative. Myeloid NGS did not detect any pathogenic variants.  Bp: 07/22/24: total spine MRI: Multifocal enhancing osseous metastatic disease within the spine especially L3 and L5  Bq. 07/25/24: brain MRI: Right frontal and occipital enhancing calvarial lesions, likely multiple myelom   2. History of CAD with MI (Stents x 2).             a. Admitted to the hospital on 03/08/23 with a myocardial infarction and underwent a three vessel CABG on 03/13/23.     3. BPH             a. Started Flomax  in early 02/22  DISPOSITION: DISEASE: Kappa Light Chain Myeloma - High dose Melphalan 200 mg/m2 followed by autologous stem cell infusion on 01/22/21. - He is currently receiving Fludarabine/Cyclophosphamide lymph-depleting chemotherapy, to be followed by CAR-T [place]) on 03/27/2024.   CELLULAR THERAPY: Preparative regimen:   - Fludarabine 30 mg/m (65 mg) IV: Day -5 through Day -2. - Cyclophosphamide 300 mg/m (650 mg) IV: Day  -5 through Day -2.  Stem Cells:   - CAR-T cell reinfusion on 03/28/24.  Was scheduled for 5/28, but patient had fall and AMS.  Supportive Care: - Transfuse prn hgb <=8gm/dl and plts <=89X. No transfusions today.  - 6/27: mild petechiae over anterior aspect of distal lower extremities bilaterally, present x4-5 days per pt. Perhaps mild progression over past few days. Plts starting to uptrend; optimistic for improvement as plts increase.    - 05/08/24 stable petechiae. Plts stable at 35.  - 05/22/2024 clearing petechiae. Platelets stable at 37.  -  Outside labs 06/21/24 WBC 2.2, Hgb 10.0, plt ct 54, ANC 1.1.              -  06/26/24 labs: WBC 1.0, Hgb 9.9, Plt ct 59, ANC 0.2.  - 07/16/24: WBC 1.6, ANC 0.2,  Hgb 11.2, plt ct 61.  CAR-T Toxicities (date/grade): - Grade 1 CRS; Toci x 1 - Grade 2 ICANS; Dex 10 mg x 1 for ICE 6/10  INFECTIOUS DISEASE Prophylaxis:  - Bacterial: Cefdinir 300 mg po q 12 hrs. continue through ANC >1000. (levaquin discontinued as possible cause of AMS). If ANC >1000  could discontinue. D/c on 06/12/24. Resumed on 06/27/24 for ANC 0.2. D/c'd on 08/13/24. - Viral: Famciclovir  500mg  po BID will continue for 6 months s/p CAR-T treatment (can then d/c if CD4 >200). - Fungal: Fluconazole 400mg  daily will continue through ANC >1000.  If ANC >1000 could discontinue.  D/C on 06/12/24. Resumed on 06/27/24 for ANC 0.2. D/c'd on 08/13/24. - PCP: Bactrim DS 1 tab po Mon/Wed/Fri will continue for 6 months s/p CAR-T treatment (can then d/c if CD4 >200).  On 05/08/2024 discontinued Bactrim with concern for ongoing pancytopenia.  Same day initiated pentamadine treatments to continue every 28 days. Given 06/07/2024. Administered on 07/16/24.  Administered 08/13/24.  Next due on  09/10/24.  Febrile neutropenia: 6/5: Admitted with fever 102.2. Was on Vanc for SSTI at CVC site, cefepime  initiated. FFWU negative and remained afebrile therefore transitioned back to cefdinir and vanc d/c'd. Toci x 1 given  for likely CRS  Weekly Monitoring: CMV: 04/08/24 - 06/26/24: negative  CARDIAC:  CAD (  Hx of CABG/Stents): - Current Regimen: Toprol -XL 25 mg daily + ASA 81 mg daily. ON HOLD with decreased BP and plts <50K.  Could restart ASA if plts remain above 50K.   Angina: - Current Regimen: Nitro 0.4 mg every 5 minutes PRN  GI: N/V Prophylaxis:  - Patient currently has Compazine  and Zofran  as needed for breakthrough symptoms.  - 6/9: IV Zofran  given in clinic x1 on 04/08/24.  Will allow for Ativan  oral to be given at night. - 6/10: nausea improved.   GERD: - Current Regimen: Protonix  40 mg 2x daily.  Probiotic Supplementation: - Current Regimen: None.  Patient will hold Florastor during acute CAR-T process.  GU:  BPH:  - Current Regimen: Flomax  0.4 mg daily--holding currently--restarted 04/14/24  Cr elevation: - 6/27: Cr 1.5 today; baseline pre-transplant 1.2. Cr has been 1.4-1.7 since 6/5. No decreased PO to suggest pre-renal physiology 6/27. Reported possible mild urinary obstructive sxs occurring ~6/20-6/24; bladder scan 6/27 0 cc (measured shortly after pt voided). 6/27 UA normal. As such, Cr elevation possibly attributable to bactrim.  - Consider transition from bactrim to atovaquone if Cr rises in future. Currently stable at 1.5. - Bactrim discontinued due to concern for ongoing pancytopenia.  Creatinine improved to 1.4. - Creatinine now further improved at 1.3 - 1.0.  CRS MONITORING: - Patient underwent a ROS as above. - Patient's vital signs were monitored, including BP and O2 sats.   NEURO: Peripheral Neuropathy: - Current Regimen: Lyrica  200 mg 2x daily + Cymbalta  60 mg daily  AMS, Hallucinations - Onset 5/27 after LD chemo prior to CAR-T cells. Head CT 5/28 NAICP. MRI 5/28 ordered, cancelled on 5/29 given improvement in AMS - 6/5: lethargy and confusion on admission, ICE 6/10, unable to write sentence, count backwards or identify month/city. Dex 10 mg x 1 given for gd 2 ICANS  with improvement to 10/10.    Weakness, Balance Deficits Fall - 5/27: s/p fall in shower.  - 5/28:  Head CT 5/28 NAICP. Infectious w/u 5/28 including BCx x2 NGTD.   Seizure Prophylaxis: - Patient will continue on Keppra  500mg  po Q12hrs through Day +30 (can then d/c if no seizure activity).  Dc'd 6/27 (D+29 PM dose; day of discharge from [facility]). Discontinued.  Neurotoxicity Monitoring: -Having increasing lower extremity muscle stiffness  MSK:  Generalized Pain: - Current Regimen: Tramadol  50 mg 4x daily PRN.  Added Tylenol  500 mg up to twice daily and tizanidine muscle relaxant 2 mg once daily with interval improvement. - Increased tizanidine to twice daily.  Patient aware that this increases fatigue. - For left hip flexion weakness, referring him for PT local to his residence. - Still with myalgias and muscle stiffness in legs.  Continuing Tramadol .  Started PT on 7/29.  Will continue next week 2 times per week. - Worsening leg weakness and stiffness as of 06/26/24.  Requesting Neuro consult,  MRI brain, MRI total spine. ALC has remained low. May need LP dependent on MRI findings. PET/CT noting loosening/ inflamed left distal femoral hardware.  - Evaluated by Dr. Redell Seltzer at [facility] on 07/15/24. His exam with notable findings including LUE and LLE>RLE weakness without isolated parkinsonism--his impairment of RAMs is driven by weakness, and there is no rigidity, tremor, shuffling gait, or other quality to implicate parkinsonism. It appears that weakness is the driving factor for his motor changes. Recommended to continue PT, start OT, obtain EMG/NCS, and follow-up with neuromusuclar medicine.  - Had adverse reaction to  oxycodone  with mood swings. Has elevated LFTs so advised against Tylenol . Will take 1 1/2 tab Tramadol  every 6 hours for pain.  Hypomagnesemia: - Current Regimen: Magnesium  oxide 400 mg daily with food.  Hypocalcemia: - Current  Regimen: Calcium  + Vitamin D3: 1 tablet 2x daily with food.  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 -  05/01/24 IgG 417. -  05/29/24 IgG 300. - 06/12/24  IVIG administered. - 07/30/24 IgG 392, borderline  FOLLOW-UP: -  ANC now consistently recovered so discontinuing prophy with Cefdinir and Diflucan.  -   Neuro consult as above noting impairment related to weakness recommending PT/OT, EMG/NGS.   MRI brain and MRI total spine with stable findings prior enhancing lesions and multiple level pathology. To continue to follow with neurology and Orthopedic office. -  Will continue pentamidine nebulizer therapy every 28 days for PJP prophy. Treatment administered today on 08/13/24. Due next on 09/10/24. - Day 90 IR BMBX on 06/26/24 with MRD with ClonoSeq is MRD negative. Myeloid NGS did not detect any pathogenic variants. Restaging PET/CT on 06/26/24  noting interval complete resolution of focal bony lesions with no new hypermetabolic bony lesions c/w disease response. -- Right cheek skin lesion concerning.  Advised topical Neomycin and urgent dermatology assessment through local office. -- Increased nocturia.  UA normal. -- Will continue to follow monthly through 6 months.  Assessment & Plan Multiple myeloma post-CAR T therapy Day 145 post-CAR T therapy. Stronger with intermittent back pain. PET CT showed lesion resolution; MRI indicated calvarian lesions. Normal bone marrow biopsy. Low platelet count at 44. - Continue active surveillance. - Discuss MRI findings with radiology to clarify calvarian lesions. His recent MRI did show several enhancing lesions (L3 and L5 and calvarium) - Plan to repeat MRI in 2-3 months - Resume aspirin  81 mg daily for cardiac protection. - Resume lovastatin. - Continue pentamidine inhalation every four weeks. - Hold Bactrim due to low platelet count. - Continue physical therapy.  Thrombocytopenia Platelet count at 44, fluctuating. No active bleeding. May impact Mohs  surgery for squamous cell carcinoma. - Monitor platelet count. - Discuss platelet count requirements with surgical team prior to Mohs surgery.  Squamous cell carcinoma of the skin Biopsy confirmed. Mohs surgery planned. Platelet count may affect surgical planning. - Proceed with Mohs surgery as planned. - Discuss platelet count requirements with surgical team.  Peripheral neuropathy of the feet Reports neuropathy and occasional wobbliness. Physical therapy beneficial. - Continue physical therapy.  Elevated liver enzymes Liver enzymes improved. AST normal, ALT decreased to 98. - Resume lovastatin. - Monitor liver enzymes.  ATTENDING NOTE  August 21, 2024     Cynthea Purpura, MD Professor of Medicine Hematologic Malignancies and Cell Therapy [phone]   ATTESTATION:   Attestation Statement:   I personally performed the service. (TP)  Yubin Kang, MD

## 2024-08-31 ENCOUNTER — Other Ambulatory Visit: Payer: Self-pay | Admitting: Hematology & Oncology

## 2024-09-03 ENCOUNTER — Other Ambulatory Visit: Payer: Self-pay

## 2024-09-05 ENCOUNTER — Other Ambulatory Visit: Payer: Self-pay

## 2024-09-05 ENCOUNTER — Inpatient Hospital Stay: Attending: Hematology & Oncology

## 2024-09-05 ENCOUNTER — Inpatient Hospital Stay

## 2024-09-05 ENCOUNTER — Inpatient Hospital Stay: Admitting: Hematology & Oncology

## 2024-09-05 VITALS — BP 107/49 | HR 63 | Temp 98.2°F | Resp 18

## 2024-09-05 VITALS — BP 126/53 | HR 66 | Temp 97.9°F | Resp 16 | Ht 68.0 in | Wt 181.0 lb

## 2024-09-05 DIAGNOSIS — Z923 Personal history of irradiation: Secondary | ICD-10-CM | POA: Diagnosis not present

## 2024-09-05 DIAGNOSIS — C419 Malignant neoplasm of bone and articular cartilage, unspecified: Secondary | ICD-10-CM

## 2024-09-05 DIAGNOSIS — C9000 Multiple myeloma not having achieved remission: Secondary | ICD-10-CM | POA: Insufficient documentation

## 2024-09-05 DIAGNOSIS — C44309 Unspecified malignant neoplasm of skin of other parts of face: Secondary | ICD-10-CM | POA: Insufficient documentation

## 2024-09-05 DIAGNOSIS — Z9484 Stem cells transplant status: Secondary | ICD-10-CM | POA: Diagnosis not present

## 2024-09-05 DIAGNOSIS — Z5189 Encounter for other specified aftercare: Secondary | ICD-10-CM | POA: Insufficient documentation

## 2024-09-05 LAB — CBC WITH DIFFERENTIAL (CANCER CENTER ONLY)
Abs Immature Granulocytes: 0.02 K/uL (ref 0.00–0.07)
Basophils Absolute: 0 K/uL (ref 0.0–0.1)
Basophils Relative: 1 %
Eosinophils Absolute: 0.1 K/uL (ref 0.0–0.5)
Eosinophils Relative: 4 %
HCT: 34 % — ABNORMAL LOW (ref 39.0–52.0)
Hemoglobin: 11.3 g/dL — ABNORMAL LOW (ref 13.0–17.0)
Immature Granulocytes: 2 %
Lymphocytes Relative: 13 %
Lymphs Abs: 0.2 K/uL — ABNORMAL LOW (ref 0.7–4.0)
MCH: 34.5 pg — ABNORMAL HIGH (ref 26.0–34.0)
MCHC: 33.2 g/dL (ref 30.0–36.0)
MCV: 103.7 fL — ABNORMAL HIGH (ref 80.0–100.0)
Monocytes Absolute: 0.6 K/uL (ref 0.1–1.0)
Monocytes Relative: 40 %
Neutro Abs: 0.6 K/uL — ABNORMAL LOW (ref 1.7–7.7)
Neutrophils Relative %: 40 %
Platelet Count: 68 K/uL — ABNORMAL LOW (ref 150–400)
RBC: 3.28 MIL/uL — ABNORMAL LOW (ref 4.22–5.81)
RDW: 14 % (ref 11.5–15.5)
Smear Review: NORMAL
WBC Count: 1.3 K/uL — ABNORMAL LOW (ref 4.0–10.5)
nRBC: 0 % (ref 0.0–0.2)

## 2024-09-05 LAB — CMP (CANCER CENTER ONLY)
ALT: 39 U/L (ref 0–44)
AST: 22 U/L (ref 15–41)
Albumin: 3.9 g/dL (ref 3.5–5.0)
Alkaline Phosphatase: 153 U/L — ABNORMAL HIGH (ref 38–126)
Anion gap: 10 (ref 5–15)
BUN: 23 mg/dL (ref 8–23)
CO2: 27 mmol/L (ref 22–32)
Calcium: 9.6 mg/dL (ref 8.9–10.3)
Chloride: 102 mmol/L (ref 98–111)
Creatinine: 1.03 mg/dL (ref 0.61–1.24)
GFR, Estimated: >60 mL/min (ref >60)
Glucose, Bld: 100 mg/dL — ABNORMAL HIGH (ref 70–99)
Potassium: 4.5 mmol/L (ref 3.5–5.1)
Sodium: 139 mmol/L (ref 135–145)
Total Bilirubin: 0.3 mg/dL (ref 0.0–1.2)
Total Protein: 5.7 g/dL — ABNORMAL LOW (ref 6.5–8.1)

## 2024-09-05 LAB — LACTATE DEHYDROGENASE: LDH: 223 U/L — ABNORMAL HIGH (ref 98–192)

## 2024-09-05 MED ORDER — ZOLEDRONIC ACID 4 MG/100ML IV SOLN: 4.0000 mg | Freq: Once | INTRAVENOUS | Status: DC

## 2024-09-05 MED ORDER — PEGFILGRASTIM-CBQV 6 MG/0.6ML ~~LOC~~ SOSY
6.0000 mg | PREFILLED_SYRINGE | Freq: Once | SUBCUTANEOUS | Status: AC
  Administered 2024-09-05: 6 mg via SUBCUTANEOUS
  Filled 2024-09-05: qty 0.6

## 2024-09-05 MED ORDER — SODIUM CHLORIDE 0.9 % IV SOLN: Freq: Once | INTRAVENOUS | Status: AC

## 2024-09-05 MED ORDER — IMMUNE GLOBULIN (HUMAN) 20 GM/200ML IV SOLN
40.0000 g | Freq: Once | INTRAVENOUS | Status: AC
  Administered 2024-09-05: 40 g via INTRAVENOUS
  Filled 2024-09-05: qty 400

## 2024-09-05 MED ORDER — DIPHENHYDRAMINE HCL 25 MG PO CAPS
25.0000 mg | ORAL_CAPSULE | Freq: Once | ORAL | Status: AC
  Administered 2024-09-05: 25 mg via ORAL
  Filled 2024-09-05: qty 1

## 2024-09-05 MED ORDER — DEXTROSE 5 % IV SOLN: INTRAVENOUS | Status: DC

## 2024-09-05 MED ORDER — ACETAMINOPHEN 325 MG PO TABS
650.0000 mg | ORAL_TABLET | Freq: Once | ORAL | Status: AC
  Administered 2024-09-05: 650 mg via ORAL
  Filled 2024-09-05: qty 2

## 2024-09-05 MED ORDER — ZOLEDRONIC ACID 4 MG/100ML IV SOLN
4.0000 mg | Freq: Once | INTRAVENOUS | Status: AC
  Administered 2024-09-05: 4 mg via INTRAVENOUS
  Filled 2024-09-05: qty 100

## 2024-09-05 NOTE — Progress Notes (Signed)
 Hematology and Oncology Follow Up Visit  Bruce Smith [id number] [birth year] 73 y.o. 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 - t(11:14), 13q-, 1p-,1q-  Past Therapy: Status post surgical repair of left femur-07/14/2020 XRT to left shoulder and left hip Xgeva  120 mg subcu every 3 months-next dose in December 2022  Faspro/Velcade /Revlimid /Decadron  -- s/p cycle #2 -- start on       08/06/2020  ( Revlimid  is 14 on /14 off)    Current Therapy:        ASCT -- [facility] on -01/22/2021 Faspro - q month -- maintenance -- start on 05/13/2021 -- d/c on 10/10/2023 Zometa  4 mg IV every 3 months-next dose in 02/2024  Radiation therapy to T12, left iliac and left humerus --completed on 10/04/2022 --XRT to right femoral head -start 01/03/2023 XRT to T9 lesion -to be completed on 06/18/2023 Kyprolis /Pomalyst  -- s/p cycle #3 - start on 10/19/2023 IVIG 40 g monthly Carvykti -  given at [facility] on 03/27/2024    Interim History:  Bruce Smith is here today for follow-up.  Yeah she is doing a lot better.  He has been hunting.  He has been able to get out and walk better.  He is still doing some physical therapy twice a week.  When he was last here his LFTs were quite elevated.  We did do an ultrasound of his liver.  Everything looked fine.  There is no cholecystitis.  There is nothing in the liver.  Today, his LFTs are normal.  He has had no fever.SABRA  He does have skin cancer on the right cheek.  He we will have Mohs surgery in I think couple weeks.  His white cell count is quite low.  We can have to give him I think Neupogen  to try to get that back up.  His last IgG level was 399 mg/dL.  He does get IVIG.  He has had no change in bowel or bladder habits.  He has had no bleeding.  There is been no cough or shortness of breath.  Overall, his performance status is ECOG 1.     Medications:  Allergies as of  09/05/2024   No Known Allergies      Medication List        Accurate as of September 05, 2024  9:23 AM. If you have any questions, ask your nurse or doctor.          acetaminophen  500 MG tablet Commonly known as: TYLENOL  Take 2 tablets (1,000 mg total) by mouth 4 (four) times daily.   aspirin  EC 81 MG tablet Take 81 mg by mouth at bedtime.   benzonatate  200 MG capsule Commonly known as: TESSALON  Take 1 capsule (200 mg total) by mouth 3 (three) times daily as needed for cough.   CALCIUM  600 PO Take 600 mg by mouth daily.   cloNIDine 0.1 MG tablet Commonly known as: CATAPRES Take 0.1 mg by mouth.   DULoxetine  60 MG capsule Commonly known as: CYMBALTA  TAKE 1 CAPSULE BY MOUTH DAILY   famciclovir  500 MG tablet Commonly known as: FAMVIR  Take 500 mg by mouth daily. What changed: Another medication with the same name was removed. Continue taking this medication, and follow the directions you see here. Changed by: Maude JONELLE Crease   guaiFENesin  600 MG 12 hr tablet Commonly known as: MUCINEX  Take 1 tablet (600 mg total) by mouth 2 (two) times daily as needed for  to loosen phlegm.   levETIRAcetam  500 MG tablet Commonly known as: KEPPRA  TAKE 1 TABLET BY MOUTH 2 TIMES A DAY   loperamide  2 MG tablet Commonly known as: IMODIUM  A-D Take 2 mg by mouth 4 (four) times daily as needed for diarrhea or loose stools.   loratadine 10 MG dissolvable tablet Commonly known as: CLARITIN REDITABS Take 10 mg by mouth daily.   LORazepam  1 MG tablet Commonly known as: ATIVAN  Take 1 mg by mouth 3 (three) times daily.   Magnesium  250 MG Caps Take 250 mg by mouth daily. What changed: how much to take   metoprolol  succinate 50 MG 24 hr tablet Commonly known as: TOPROL -XL Take 50 mg by mouth daily. What changed: how much to take   naloxone  4 MG/0.1ML Liqd nasal spray kit Commonly known as: NARCAN  Administer as directed in each nostril in the event of an overdose. May repeat in 15  minutes.   nitroGLYCERIN  0.4 MG SL tablet Commonly known as: NITROSTAT  Place 0.4 mg under the tongue every 5 (five) minutes as needed.   ondansetron  8 MG tablet Commonly known as: Zofran  Take 1 tablet (8 mg total) by mouth every 8 (eight) hours as needed for nausea or vomiting.   pantoprazole  40 MG tablet Commonly known as: PROTONIX  TAKE 1 TABLET BY MOUTH 2 TIMES A DAY BEFORE MEALS FOR ACID REFLUX   polyethylene glycol powder 17 GM/SCOOP powder Commonly known as: GLYCOLAX /MIRALAX  Take 17 g by mouth daily.   pregabalin  100 MG capsule Commonly known as: LYRICA  TAKE 1 CAPSULE BY MOUTH 2 TIMES DAILY   prochlorperazine  10 MG tablet Commonly known as: COMPAZINE  Take 1 tablet (10 mg total) by mouth every 6 (six) hours as needed for nausea or vomiting.   rosuvastatin  40 MG tablet Commonly known as: CRESTOR  TAKE 1 TABLET BY MOUTH EVERY DAY FOR CHOLESTEROL   saccharomyces boulardii 250 MG capsule Commonly known as: FLORASTOR Take 250 mg by mouth daily.   tamsulosin  0.4 MG Caps capsule Commonly known as: FLOMAX  TAKE 1 CAPSULE BY MOUTH EVERY NIGHT AT BEDTIME   tiZANidine 2 MG tablet Commonly known as: ZANAFLEX Take 2 mg by mouth 2 (two) times daily.   traMADol  50 MG tablet Commonly known as: ULTRAM  TAKE 2 TABLETS BY MOUTH EVERY 6 HOURS AS NEEDED FOR PAIN What changed: how much to take        Allergies: No Known Allergies  Past Medical History, Surgical history, Social history, and Family History were reviewed and updated.  Review of Systems: Review of Systems  Constitutional: Negative.   HENT: Negative.    Eyes: Negative.   Respiratory: Negative.    Cardiovascular: Negative.   Gastrointestinal: Negative.   Genitourinary: Negative.   Musculoskeletal:  Positive for joint pain.  Skin: Negative.   Neurological: Negative.   Endo/Heme/Allergies: Negative.   Psychiatric/Behavioral: Negative.       Physical Exam:  Vital signs show temperature of 97.9.  Pulse 66.   Blood pressure is 126/53.  Weight is 181 pounds.  Wt Readings from Last 3 Encounters:  08/14/24 177 lb (80.3 kg)  07/03/24 189 lb 6.4 oz (85.9 kg)  06/21/24 190 lb (86.2 kg)    Physical Exam Vitals reviewed.  Constitutional:      Comments: This is a well-developed and well-nourished white male in no obvious distress.  He has a nicely healed median sternotomy scar.  HENT:     Head: Normocephalic and atraumatic.  Eyes:     Pupils: Pupils are equal, round,  and reactive to light.  Cardiovascular:     Rate and Rhythm: Normal rate and regular rhythm.     Heart sounds: Normal heart sounds.  Pulmonary:     Effort: Pulmonary effort is normal.     Breath sounds: Normal breath sounds.  Abdominal:     General: Bowel sounds are normal.     Palpations: Abdomen is soft.  Musculoskeletal:        General: No tenderness or deformity. Normal range of motion.     Cervical back: Normal range of motion.     Comments: There is pain to palpation in the left hip area.  This is in the lateral hip area.  He has some decreased range of motion.  Lymphadenopathy:     Cervical: No cervical adenopathy.  Skin:    General: Skin is warm and dry.     Findings: No erythema or rash.  Neurological:     Mental Status: He is alert and oriented to person, place, and time.  Psychiatric:        Behavior: Behavior normal.        Thought Content: Thought content normal.        Judgment: Judgment normal.      Lab Results  Component Value Date   WBC 1.3 (L) 09/05/2024   HGB 11.3 (L) 09/05/2024   HCT 34.0 (L) 09/05/2024   MCV 103.7 (H) 09/05/2024   PLT 68 (L) 09/05/2024   Lab Results  Component Value Date   FERRITIN 135 11/15/2023   IRON 35 (L) 11/15/2023   TIBC 225 (L) 11/15/2023   UIBC 190 11/15/2023   IRONPCTSAT 16 (L) 11/15/2023   Lab Results  Component Value Date   RETICCTPCT 1.3 11/14/2023   RBC 3.28 (L) 09/05/2024   Lab Results  Component Value Date   KPAFRELGTCHN <0.7 (L) 08/14/2024    LAMBDASER <1.5 (L) 08/14/2024   KAPLAMBRATIO UNABLE TO CALCULATE 08/14/2024   Lab Results  Component Value Date   IGGSERUM 399 (L) 08/14/2024   IGA <5 (L) 08/14/2024   IGMSERUM <5 (L) 08/14/2024   Lab Results  Component Value Date   TOTALPROTELP 5.4 (L) 07/03/2024   ALBUMINELP 3.2 07/03/2024   A1GS 0.2 07/03/2024   A2GS 0.8 07/03/2024   BETS 0.7 07/03/2024   GAMS 0.4 07/03/2024   MSPIKE Not Observed 07/03/2024     Chemistry      Component Value Date/Time   NA 137 08/14/2024 0905   K 5.2 (H) 08/14/2024 0905   CL 99 08/14/2024 0905   CO2 26 08/14/2024 0905   BUN 26 (H) 08/14/2024 0905   CREATININE 1.35 (H) 08/14/2024 0905      Component Value Date/Time   CALCIUM  10.2 08/14/2024 0905   ALKPHOS 194 (H) 08/14/2024 0905   AST 133 (H) 08/14/2024 0905   ALT 295 (HH) 08/14/2024 0905   BILITOT 0.3 08/14/2024 0905       Impression and Plan: Mr. Fugitt is a very pleasant 73 yo gentleman with 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   He finally got to [facility] and went through the CAR-T protocol.  He was out there for 40 days.  Again, he had quite a few toxicities from the Carvykti.  Everything is starting to finally turnaround.  He actually is using a special ointment on his feet which have really helped.  This is called  Two Old Goats.  I may have to see if other patients can  use this for their neuropathy.  For right now, I will have to give him some Neupogen .  He really needs this in order to get his white cell count up.  I will have him come in before he has his Mohs surgery just for lab work.  I will plan to see him back after Thanksgiving.    Maude JONELLE Crease, MD 11/6/20259:23 AM

## 2024-09-05 NOTE — Patient Instructions (Signed)
 Immune Globulin  Injection What is this medication? IMMUNE GLOBULIN  (im MUNE GLOB yoo lin) treats many immune system conditions. It works by Designer, multimedia extra antibodies. Antibodies are proteins made by the immune system that help protect the body. This medicine may be used for other purposes; ask your health care provider or pharmacist if you have questions. COMMON BRAND NAME(S): ASCENIV, Baygam, BIVIGAM, Carimune, Carimune NF, cutaquig, Cuvitru, Flebogamma, Flebogamma DIF, GamaSTAN, GamaSTAN S/D, Gamimune N, Gammagard, Gammagard S/D, Gammaked, Gammaplex, Gammar-P IV, Gamunex, Gamunex-C, Hizentra, Iveegam, Iveegam EN, Octagam, Panglobulin, Panglobulin NF, panzyga, Polygam S/D, Privigen , Sandoglobulin, Venoglobulin-S, Vigam, Vivaglobulin, Xembify What should I tell my care team before I take this medication? They need to know if you have any of these conditions: Blood clotting disorder Condition where you have excess fluid in your body, such as heart failure or edema Dehydration Diabetes Have had blood clots Heart disease Immune system conditions Kidney disease Low levels of IgA Recent or upcoming vaccine An unusual or allergic reaction to immune globulin , other medications, foods, dyes, or preservatives Pregnant or trying to get pregnant Breastfeeding How should I use this medication? This medication is infused into a vein or under the skin. It may also be injected into a muscle. It is usually given by your care team in a hospital or clinic setting. It may also be given at home. If you get this medication at home, you will be taught how to prepare and give it. Take it as directed on the prescription label. Keep taking it unless your care team tells you to stop. It is important that you put your used needles and syringes in a special sharps container. Do not put them in a trash can. If you do not have a sharps container, call your pharmacist or care team to get one. Talk to your care team  about the use of this medication in children. While it may be given to children for selected conditions, precautions do apply. Overdosage: If you think you have taken too much of this medicine contact a poison control center or emergency room at once. NOTE: This medicine is only for you. Do not share this medicine with others. What if I miss a dose? If you get this medication at the hospital or clinic: It is important not to miss your dose. Call your care team if you are unable to keep an appointment. If you give yourself this medication at home: If you miss a dose, take it as soon as you can. Then continue your normal schedule. If it is almost time for your next dose, take only that dose. Do not take double or extra doses. Call your care team with questions. What may interact with this medication? Live virus vaccines This list may not describe all possible interactions. Give your health care provider a list of all the medicines, herbs, non-prescription drugs, or dietary supplements you use. Also tell them if you smoke, drink alcohol , or use illegal drugs. Some items may interact with your medicine. What should I watch for while using this medication? Your condition will be monitored carefully while you are receiving this medication. Tell your care team if your symptoms do not start to get better or if they get worse. You may need blood work done while you are taking this medication. This medication increases the risk of blood clots. People with heart, blood vessel, or blood clotting conditions are more likely to develop a blood clot. Other risk factors include advanced age, estrogen use, tobacco  use, lack of movement, and being overweight. This medication can decrease the response to a vaccine. If you need to get vaccinated, tell your care team if you have received this medication within the last year. Extra booster doses may be needed. Talk to your care team to see if a different vaccination schedule  is needed. This medication is made from donated human blood. There is a small risk it may contain bacteria or viruses, such as hepatitis or HIV. All products are processed to kill most bacteria and viruses. Talk to your care team if you have questions about the risk of infection. If you have diabetes, talk to your care team about which device you should use to check your blood sugar. This medication may cause some devices to report falsely high blood sugar levels. This may cause you to react by not treating a low blood sugar level or by giving an insulin  dose that was not needed. This can cause severe low blood sugar levels. What side effects may I notice from receiving this medication? Side effects that you should report to your care team as soon as possible: Allergic reactions--skin rash, itching, hives, swelling of the face, lips, tongue, or throat Blood clot--pain, swelling, or warmth in the leg, shortness of breath, chest pain Fever, neck pain or stiffness, sensitivity to light, headache, nausea, vomiting, confusion, which may be signs of meningitis Hemolytic anemia--unusual weakness or fatigue, dizziness, headache, trouble breathing, dark urine, yellowing skin or eyes Kidney injury--decrease in the amount of urine, swelling of the ankles, hands, or feet Low sodium level--muscle weakness, fatigue, dizziness, headache, confusion Shortness of breath or trouble breathing, cough, unusual weakness or fatigue, blue skin or lips Side effects that usually do not require medical attention (report these to your care team if they continue or are bothersome): Chills Diarrhea Fever Headache Nausea This list may not describe all possible side effects. Call your doctor for medical advice about side effects. You may report side effects to FDA at 1-800-FDA-1088. Where should I keep my medication? Keep out of the reach of children and pets. You will be instructed on how to store this medication. Get rid of  any unused medication after the expiration date. To get rid of medications that are no longer needed or have expired: Take the medication to a medication take-back program. Check with your pharmacy or law enforcement to find a location. If you cannot return the medication, ask your pharmacist or care team how to get rid of this medication safely. NOTE: This sheet is a summary. It may not cover all possible information. If you have questions about this medicine, talk to your doctor, pharmacist, or health care provider.  2025 Elsevier/Gold Standard (2024-01-01 00:00:00)

## 2024-09-05 NOTE — Progress Notes (Signed)
 Patient did not have pre meds ordered for IVIG as he didn't know he was getting IVIG today. Therefore, he did not pre med at home. After speaking with Dr. Timmy, order given and carried out for Tylenol  650 mg and Benadryl  25 mg as premeds for IVIG administration.

## 2024-09-06 ENCOUNTER — Other Ambulatory Visit: Payer: Self-pay

## 2024-09-06 LAB — IGG, IGA, IGM
IgA: <5 mg/dL — ABNORMAL LOW (ref 61–437)
IgG (Immunoglobin G), Serum: 302 mg/dL — ABNORMAL LOW (ref 603–1613)
IgM (Immunoglobulin M), Srm: <5 mg/dL — ABNORMAL LOW (ref 15–143)

## 2024-09-06 LAB — KAPPA/LAMBDA LIGHT CHAINS
Kappa free light chain: <0.7 mg/L — ABNORMAL LOW (ref 3.3–19.4)
Kappa, lambda light chain ratio: UNDETERMINED
Lambda free light chains: <1.5 mg/L — ABNORMAL LOW (ref 5.7–26.3)

## 2024-09-07 ENCOUNTER — Other Ambulatory Visit: Payer: Self-pay | Admitting: Hematology & Oncology

## 2024-09-07 DIAGNOSIS — C9000 Multiple myeloma not having achieved remission: Secondary | ICD-10-CM

## 2024-09-07 DIAGNOSIS — E440 Moderate protein-calorie malnutrition: Secondary | ICD-10-CM

## 2024-09-09 ENCOUNTER — Inpatient Hospital Stay

## 2024-09-09 ENCOUNTER — Encounter: Payer: Self-pay | Admitting: Hematology & Oncology

## 2024-09-09 ENCOUNTER — Telehealth: Payer: Self-pay

## 2024-09-09 DIAGNOSIS — C419 Malignant neoplasm of bone and articular cartilage, unspecified: Secondary | ICD-10-CM

## 2024-09-09 DIAGNOSIS — C9000 Multiple myeloma not having achieved remission: Secondary | ICD-10-CM

## 2024-09-09 MED ORDER — METHYLPREDNISOLONE 4 MG PO TBPK: ORAL_TABLET | ORAL | 0 refills | Status: AC

## 2024-09-09 NOTE — Telephone Encounter (Signed)
 Pt's wife called in stating that pt has broke out with a rash on his torso, head and arms. Pt is currently taking Benadryl  but the rash is getting redder in color. Rash appeared after last visit when pt received IVIG.  Dr Timmy made aware and would like pt to start a medrol dose pack in addition to the Benadryl . Luke advised and states understanding.

## 2024-09-11 LAB — IMMUNOFIXATION REFLEX, SERUM
IgA: <5 mg/dL — ABNORMAL LOW (ref 61–437)
IgG (Immunoglobin G), Serum: 330 mg/dL — ABNORMAL LOW (ref 603–1613)
IgM (Immunoglobulin M), Srm: <5 mg/dL — ABNORMAL LOW (ref 15–143)

## 2024-09-11 LAB — PROTEIN ELECTROPHORESIS, SERUM, WITH REFLEX
A/G Ratio: 1.7 (ref 0.7–1.7)
Albumin ELP: 3.4 g/dL (ref 2.9–4.4)
Alpha-1-Globulin: 0.3 g/dL (ref 0.0–0.4)
Alpha-2-Globulin: 0.8 g/dL (ref 0.4–1.0)
Beta Globulin: 0.8 g/dL (ref 0.7–1.3)
Gamma Globulin: 0.2 g/dL — ABNORMAL LOW (ref 0.4–1.8)
Globulin, Total: 2 g/dL — ABNORMAL LOW (ref 2.2–3.9)
SPEP Interpretation: 0
Total Protein ELP: 5.4 g/dL — ABNORMAL LOW (ref 6.0–8.5)

## 2024-09-14 LAB — UPEP/UIFE/LIGHT CHAINS/TP, 24-HR UR
% BETA, Urine: 30.3 %
ALPHA 1 URINE: 8.4 %
Albumin, U: 25 %
Alpha 2, Urine: 19.3 %
Free Kappa Lt Chains,Ur: 0.38 mg/L — ABNORMAL LOW (ref 1.17–86.46)
Free Kappa/Lambda Ratio: 0.5 — ABNORMAL LOW (ref 1.83–14.26)
Free Lambda Lt Chains,Ur: 0.76 mg/L (ref 0.27–15.21)
GAMMA GLOBULIN URINE: 17 %
Total Protein, Urine-Ur/day: 89 mg/(24.h) (ref 30–150)
Total Protein, Urine: 4.8 mg/dL
Total Volume: 1850

## 2024-09-19 ENCOUNTER — Other Ambulatory Visit: Payer: Self-pay | Admitting: *Deleted

## 2024-09-19 ENCOUNTER — Inpatient Hospital Stay

## 2024-09-19 DIAGNOSIS — C9000 Multiple myeloma not having achieved remission: Secondary | ICD-10-CM

## 2024-09-19 DIAGNOSIS — C419 Malignant neoplasm of bone and articular cartilage, unspecified: Secondary | ICD-10-CM

## 2024-09-19 LAB — CBC WITH DIFFERENTIAL (CANCER CENTER ONLY)
Abs Immature Granulocytes: 0.04 K/uL (ref 0.00–0.07)
Basophils Absolute: 0 K/uL (ref 0.0–0.1)
Basophils Relative: 1 %
Eosinophils Absolute: 0.1 K/uL (ref 0.0–0.5)
Eosinophils Relative: 3 %
HCT: 32.9 % — ABNORMAL LOW (ref 39.0–52.0)
Hemoglobin: 11.3 g/dL — ABNORMAL LOW (ref 13.0–17.0)
Immature Granulocytes: 1 %
Lymphocytes Relative: 6 %
Lymphs Abs: 0.3 K/uL — ABNORMAL LOW (ref 0.7–4.0)
MCH: 35.1 pg — ABNORMAL HIGH (ref 26.0–34.0)
MCHC: 34.3 g/dL (ref 30.0–36.0)
MCV: 102.2 fL — ABNORMAL HIGH (ref 80.0–100.0)
Monocytes Absolute: 0.7 K/uL (ref 0.1–1.0)
Monocytes Relative: 15 %
Neutro Abs: 3.3 K/uL (ref 1.7–7.7)
Neutrophils Relative %: 74 %
Platelet Count: 36 K/uL — ABNORMAL LOW (ref 150–400)
RBC: 3.22 MIL/uL — ABNORMAL LOW (ref 4.22–5.81)
RDW: 14.7 % (ref 11.5–15.5)
Smear Review: NORMAL
WBC Count: 4.4 K/uL (ref 4.0–10.5)
nRBC: 0 % (ref 0.0–0.2)

## 2024-09-19 LAB — CMP (CANCER CENTER ONLY)
ALT: 28 U/L (ref 0–44)
AST: 26 U/L (ref 15–41)
Albumin: 3.6 g/dL (ref 3.5–5.0)
Alkaline Phosphatase: 137 U/L — ABNORMAL HIGH (ref 38–126)
Anion gap: 10 (ref 5–15)
BUN: 20 mg/dL (ref 8–23)
CO2: 22 mmol/L (ref 22–32)
Calcium: 9.3 mg/dL (ref 8.9–10.3)
Chloride: 104 mmol/L (ref 98–111)
Creatinine: 1 mg/dL (ref 0.61–1.24)
GFR, Estimated: >60 mL/min (ref >60)
Glucose, Bld: 89 mg/dL (ref 70–99)
Potassium: 4.2 mmol/L (ref 3.5–5.1)
Sodium: 136 mmol/L (ref 135–145)
Total Bilirubin: 0.3 mg/dL (ref 0.0–1.2)
Total Protein: 5.8 g/dL — ABNORMAL LOW (ref 6.5–8.1)

## 2024-09-19 NOTE — Progress Notes (Signed)
 Reviewed today's labs with Dr. Timmy. Patient doesn't need anything today. Reviewed labs with patient and wife. Platelet count has decreased. I instructed him that if he notices any signs of bleeding from today and over the weekend to go to the ER to be assessed. Dr. Timmy wants to recheck las on Monday, Nov. 24th at 8 am. Appointment made. Patient and wife aware of appointment. Also, patient has an appointment across the street at [facility] on Monday, November, 24th at [facility]. The dermatologist will not do the procedure if the platelets are less than 20. He is to take Monday's labs to that appointment. All questions answered. Wife and patient verbalized understanding.

## 2024-09-23 ENCOUNTER — Inpatient Hospital Stay

## 2024-09-23 ENCOUNTER — Telehealth: Payer: Self-pay | Admitting: *Deleted

## 2024-09-23 DIAGNOSIS — C9000 Multiple myeloma not having achieved remission: Secondary | ICD-10-CM | POA: Diagnosis not present

## 2024-09-23 LAB — CBC WITH DIFFERENTIAL (CANCER CENTER ONLY)
Abs Immature Granulocytes: 0.18 K/uL — ABNORMAL HIGH (ref 0.00–0.07)
Basophils Absolute: 0 K/uL (ref 0.0–0.1)
Basophils Relative: 2 %
Eosinophils Absolute: 0.1 K/uL (ref 0.0–0.5)
Eosinophils Relative: 4 %
HCT: 33.9 % — ABNORMAL LOW (ref 39.0–52.0)
Hemoglobin: 11.2 g/dL — ABNORMAL LOW (ref 13.0–17.0)
Immature Granulocytes: 9 %
Lymphocytes Relative: 14 %
Lymphs Abs: 0.3 K/uL — ABNORMAL LOW (ref 0.7–4.0)
MCH: 33.9 pg (ref 26.0–34.0)
MCHC: 33 g/dL (ref 30.0–36.0)
MCV: 102.7 fL — ABNORMAL HIGH (ref 80.0–100.0)
Metamyelocytes Relative: 0 %
Monocytes Absolute: 0.8 K/uL (ref 0.1–1.0)
Monocytes Relative: 42 %
Neutro Abs: 0.6 K/uL — ABNORMAL LOW (ref 1.7–7.7)
Neutrophils Relative %: 29 %
RBC: 3.3 MIL/uL — ABNORMAL LOW (ref 4.22–5.81)
RDW: 14.8 % (ref 11.5–15.5)
WBC Count: 1.9 K/uL — ABNORMAL LOW (ref 4.0–10.5)
nRBC: 0 % (ref 0.0–0.2)
nRBC: 0 /100{WBCs}

## 2024-09-23 LAB — CMP (CANCER CENTER ONLY)
ALT: 30 U/L (ref 0–44)
AST: 25 U/L (ref 15–41)
Albumin: 3.9 g/dL (ref 3.5–5.0)
Alkaline Phosphatase: 144 U/L — ABNORMAL HIGH (ref 38–126)
Anion gap: 9 (ref 5–15)
BUN: 15 mg/dL (ref 8–23)
CO2: 26 mmol/L (ref 22–32)
Calcium: 9.2 mg/dL (ref 8.9–10.3)
Chloride: 105 mmol/L (ref 98–111)
Creatinine: 0.97 mg/dL (ref 0.61–1.24)
GFR, Estimated: >60 mL/min (ref >60)
Glucose, Bld: 72 mg/dL (ref 70–99)
Potassium: 4.5 mmol/L (ref 3.5–5.1)
Sodium: 139 mmol/L (ref 135–145)
Total Bilirubin: 0.3 mg/dL (ref 0.0–1.2)
Total Protein: 5.8 g/dL — ABNORMAL LOW (ref 6.5–8.1)

## 2024-09-23 NOTE — Telephone Encounter (Signed)
 This nurse faxed CBC results to [facility] attention Dr. Dionisio so patient could have his facial surgery  today. Office # [phone] and fax # [phone]. Platelet count is 41.

## 2024-09-30 ENCOUNTER — Other Ambulatory Visit: Payer: Self-pay

## 2024-09-30 DIAGNOSIS — C9000 Multiple myeloma not having achieved remission: Secondary | ICD-10-CM

## 2024-10-01 ENCOUNTER — Inpatient Hospital Stay: Attending: Hematology & Oncology

## 2024-10-01 ENCOUNTER — Other Ambulatory Visit: Payer: Self-pay

## 2024-10-01 ENCOUNTER — Encounter: Payer: Self-pay | Admitting: Hematology & Oncology

## 2024-10-01 ENCOUNTER — Inpatient Hospital Stay: Admitting: Hematology & Oncology

## 2024-10-01 VITALS — BP 129/64 | HR 69 | Temp 97.9°F | Resp 16 | Ht 68.0 in | Wt 178.0 lb

## 2024-10-01 DIAGNOSIS — C9000 Multiple myeloma not having achieved remission: Secondary | ICD-10-CM | POA: Diagnosis present

## 2024-10-01 DIAGNOSIS — C9001 Multiple myeloma in remission: Secondary | ICD-10-CM | POA: Diagnosis not present

## 2024-10-01 DIAGNOSIS — D801 Nonfamilial hypogammaglobulinemia: Secondary | ICD-10-CM | POA: Insufficient documentation

## 2024-10-01 DIAGNOSIS — D709 Neutropenia, unspecified: Secondary | ICD-10-CM | POA: Diagnosis not present

## 2024-10-01 DIAGNOSIS — Z923 Personal history of irradiation: Secondary | ICD-10-CM | POA: Insufficient documentation

## 2024-10-01 DIAGNOSIS — Z9484 Stem cells transplant status: Secondary | ICD-10-CM | POA: Diagnosis not present

## 2024-10-01 DIAGNOSIS — D696 Thrombocytopenia, unspecified: Secondary | ICD-10-CM | POA: Diagnosis not present

## 2024-10-01 LAB — CMP (CANCER CENTER ONLY)
ALT: 26 U/L (ref 0–44)
AST: 26 U/L (ref 15–41)
Albumin: 3.7 g/dL (ref 3.5–5.0)
Alkaline Phosphatase: 133 U/L — ABNORMAL HIGH (ref 38–126)
Anion gap: 10 (ref 5–15)
BUN: 18 mg/dL (ref 8–23)
CO2: 24 mmol/L (ref 22–32)
Calcium: 9.5 mg/dL (ref 8.9–10.3)
Chloride: 104 mmol/L (ref 98–111)
Creatinine: 1 mg/dL (ref 0.61–1.24)
GFR, Estimated: >60 mL/min (ref >60)
Glucose, Bld: 102 mg/dL — ABNORMAL HIGH (ref 70–99)
Potassium: 4.8 mmol/L (ref 3.5–5.1)
Sodium: 138 mmol/L (ref 135–145)
Total Bilirubin: 0.3 mg/dL (ref 0.0–1.2)
Total Protein: 6 g/dL — ABNORMAL LOW (ref 6.5–8.1)

## 2024-10-01 LAB — CBC WITH DIFFERENTIAL (CANCER CENTER ONLY)
Abs Immature Granulocytes: 0.02 K/uL (ref 0.00–0.07)
Basophils Absolute: 0 K/uL (ref 0.0–0.1)
Basophils Relative: 1 %
Eosinophils Absolute: 0.1 K/uL (ref 0.0–0.5)
Eosinophils Relative: 4 %
HCT: 34 % — ABNORMAL LOW (ref 39.0–52.0)
Hemoglobin: 11.7 g/dL — ABNORMAL LOW (ref 13.0–17.0)
Immature Granulocytes: 1 %
Lymphocytes Relative: 17 %
Lymphs Abs: 0.3 K/uL — ABNORMAL LOW (ref 0.7–4.0)
MCH: 34.6 pg — ABNORMAL HIGH (ref 26.0–34.0)
MCHC: 34.4 g/dL (ref 30.0–36.0)
MCV: 100.6 fL — ABNORMAL HIGH (ref 80.0–100.0)
Monocytes Absolute: 0.8 K/uL (ref 0.1–1.0)
Monocytes Relative: 43 %
Neutro Abs: 0.6 K/uL — ABNORMAL LOW (ref 1.7–7.7)
Neutrophils Relative %: 34 %
Platelet Count: 66 K/uL — ABNORMAL LOW (ref 150–400)
RBC: 3.38 MIL/uL — ABNORMAL LOW (ref 4.22–5.81)
RDW: 14.5 % (ref 11.5–15.5)
WBC Count: 1.9 K/uL — ABNORMAL LOW (ref 4.0–10.5)
nRBC: 0 % (ref 0.0–0.2)

## 2024-10-01 NOTE — Progress Notes (Unsigned)
 Hematology and Oncology Follow Up Visit  Arren Laminack [id number] 1951-09-22 73 y.o. 10/01/2024   Principle Diagnosis:  Kappa light chain myeloma-extensive bone disease-pathologic fracture of left scapula and left femur - t(11:14), 13q-, 1p-,1q-  Past Therapy: Status post surgical repair of left femur-07/14/2020 XRT to left shoulder and left hip Xgeva  120 mg subcu every 3 months-next dose in December 2022  Faspro/Velcade /Revlimid /Decadron  -- s/p cycle #2 -- start on       08/06/2020  ( Revlimid  is 14 on /14 off)    Current Therapy:        ASCT -- [facility] on -01/22/2021 Faspro - q month -- maintenance -- start on 05/13/2021 -- d/c on 10/10/2023 Zometa  4 mg IV every 3 months-next dose in 02/2024  Radiation therapy to T12, left iliac and left humerus --completed on 10/04/2022 --XRT to right femoral head -start 01/03/2023 XRT to T9 lesion -to be completed on 06/18/2023 Kyprolis /Pomalyst  -- s/p cycle #3 - start on 10/19/2023 IVIG 40 g monthly Carvykti -  given at [facility] on 03/27/2024    Interim History:  Mr. Cudmore is here today for follow-up.  Yeah she is doing a lot better.  He has been hunting.  He has been able to get out and walk better.  He is still doing some physical therapy twice a week.  When he was last here his LFTs were quite elevated.  We did do an ultrasound of his liver.  Everything looked fine.  There is no cholecystitis.  There is nothing in the liver.  Today, his LFTs are normal.  He has had no fever.SABRA  He does have skin cancer on the right cheek.  He we will have Mohs surgery in I think couple weeks.  His white cell count is quite low.  We can have to give him I think Neupogen  to try to get that back up.  His last IgG level was 399 mg/dL.  He does get IVIG.  He has had no change in bowel or bladder habits.  He has had no bleeding.  There is been no cough or shortness of breath.  Overall, his performance status is ECOG 1.     Medications:  Allergies as of  10/01/2024   No Known Allergies      Medication List        Accurate as of October 01, 2024  8:28 AM. If you have any questions, ask your nurse or doctor.          aspirin  EC 81 MG tablet Take 81 mg by mouth at bedtime.   benzonatate  200 MG capsule Commonly known as: TESSALON  Take 1 capsule (200 mg total) by mouth 3 (three) times daily as needed for cough.   CALCIUM  600 PO Take 600 mg by mouth daily.   cloNIDine 0.1 MG tablet Commonly known as: CATAPRES Take 0.1 mg by mouth.   DULoxetine  60 MG capsule Commonly known as: CYMBALTA  TAKE 1 CAPSULE BY MOUTH DAILY   famciclovir  500 MG tablet Commonly known as: FAMVIR  Take 500 mg by mouth daily.   guaiFENesin  600 MG 12 hr tablet Commonly known as: MUCINEX  Take 1 tablet (600 mg total) by mouth 2 (two) times daily as needed for to loosen phlegm.   loperamide  2 MG tablet Commonly known as: IMODIUM  A-D Take 2 mg by mouth 4 (four) times daily as needed for diarrhea or loose stools.   loratadine 10 MG tablet Commonly known as: CLARITIN Take 10 mg by mouth daily.  LORazepam  1 MG tablet Commonly known as: ATIVAN  Take 1 mg by mouth 3 (three) times daily.   Magnesium  250 MG Caps Take 250 mg by mouth daily. What changed: how much to take   methylPREDNISolone  4 MG Tbpk tablet Commonly known as: MEDROL  DOSEPAK Please take as directed by pharmacy.   metoprolol  succinate 25 MG 24 hr tablet Commonly known as: TOPROL -XL Take 25 mg by mouth daily.   naloxone  4 MG/0.1ML Liqd nasal spray kit Commonly known as: NARCAN  Administer as directed in each nostril in the event of an overdose. May repeat in 15 minutes.   nitroGLYCERIN  0.4 MG SL tablet Commonly known as: NITROSTAT  Place 0.4 mg under the tongue every 5 (five) minutes as needed.   ondansetron  8 MG tablet Commonly known as: ZOFRAN  Take 8 mg by mouth every 8 (eight) hours as needed.   pantoprazole  40 MG tablet Commonly known as: PROTONIX  TAKE 1 TABLET BY MOUTH 2  TIMES A DAY BEFORE MEALS FOR ACID REFLUX   pentamidine 300 MG inhalation solution Commonly known as: PENTAM Take 300 mg by nebulization.   polyethylene glycol powder 17 GM/SCOOP powder Commonly known as: GLYCOLAX /MIRALAX  Take 17 g by mouth daily.   pregabalin  100 MG capsule Commonly known as: LYRICA  TAKE 1 CAPSULE BY MOUTH 2 TIMES DAILY   prochlorperazine  10 MG tablet Commonly known as: COMPAZINE  Take 1 tablet (10 mg total) by mouth every 6 (six) hours as needed for nausea or vomiting.   rosuvastatin  40 MG tablet Commonly known as: CRESTOR  TAKE 1 TABLET BY MOUTH EVERY DAY FOR CHOLESTEROL   tamsulosin  0.4 MG Caps capsule Commonly known as: FLOMAX  TAKE 1 CAPSULE BY MOUTH EVERY NIGHT AT BEDTIME   tiZANidine 2 MG tablet Commonly known as: ZANAFLEX Take 2 mg by mouth 2 (two) times daily.   traMADol  50 MG tablet Commonly known as: ULTRAM  Take 50 mg by mouth every 4 (four) hours.        Allergies: No Known Allergies  Past Medical History, Surgical history, Social history, and Family History were reviewed and updated.  Review of Systems: Review of Systems  Constitutional: Negative.   HENT: Negative.    Eyes: Negative.   Respiratory: Negative.    Cardiovascular: Negative.   Gastrointestinal: Negative.   Genitourinary: Negative.   Musculoskeletal:  Positive for joint pain.  Skin: Negative.   Neurological: Negative.   Endo/Heme/Allergies: Negative.   Psychiatric/Behavioral: Negative.       Physical Exam:  Vital signs show temperature of 97.9.  Pulse 66.  Blood pressure is 126/53.  Weight is 181 pounds.  Wt Readings from Last 3 Encounters:  09/05/24 181 lb (82.1 kg)  08/14/24 177 lb (80.3 kg)  07/03/24 189 lb 6.4 oz (85.9 kg)    Physical Exam Vitals reviewed.  Constitutional:      Comments: This is a well-developed and well-nourished white male in no obvious distress.  He has a nicely healed median sternotomy scar.  HENT:     Head: Normocephalic and  atraumatic.  Eyes:     Pupils: Pupils are equal, round, and reactive to light.  Cardiovascular:     Rate and Rhythm: Normal rate and regular rhythm.     Heart sounds: Normal heart sounds.  Pulmonary:     Effort: Pulmonary effort is normal.     Breath sounds: Normal breath sounds.  Abdominal:     General: Bowel sounds are normal.     Palpations: Abdomen is soft.  Musculoskeletal:  General: No tenderness or deformity. Normal range of motion.     Cervical back: Normal range of motion.     Comments: There is pain to palpation in the left hip area.  This is in the lateral hip area.  He has some decreased range of motion.  Lymphadenopathy:     Cervical: No cervical adenopathy.  Skin:    General: Skin is warm and dry.     Findings: No erythema or rash.  Neurological:     Mental Status: He is alert and oriented to person, place, and time.  Psychiatric:        Behavior: Behavior normal.        Thought Content: Thought content normal.        Judgment: Judgment normal.      Lab Results  Component Value Date   WBC 1.9 (L) 10/01/2024   HGB 11.7 (L) 10/01/2024   HCT 34.0 (L) 10/01/2024   MCV 100.6 (H) 10/01/2024   PLT 66 (L) 10/01/2024   Lab Results  Component Value Date   FERRITIN 135 11/15/2023   IRON 35 (L) 11/15/2023   TIBC 225 (L) 11/15/2023   UIBC 190 11/15/2023   IRONPCTSAT 16 (L) 11/15/2023   Lab Results  Component Value Date   RETICCTPCT 1.3 11/14/2023   RBC 3.38 (L) 10/01/2024   Lab Results  Component Value Date   KPAFRELGTCHN <0.7 (L) 09/05/2024   LAMBDASER <1.5 (L) 09/05/2024   KAPLAMBRATIO 0.50 (L) 09/09/2024   Lab Results  Component Value Date   IGGSERUM 302 (L) 09/05/2024   IGGSERUM 330 (L) 09/05/2024   IGA <5 (L) 09/05/2024   IGA <5 (L) 09/05/2024   IGMSERUM <5 (L) 09/05/2024   IGMSERUM <5 (L) 09/05/2024   Lab Results  Component Value Date   TOTALPROTELP 5.4 (L) 09/05/2024   ALBUMINELP 3.4 09/05/2024   A1GS 0.3 09/05/2024   A2GS 0.8  09/05/2024   BETS 0.8 09/05/2024   GAMS 0.2 (L) 09/05/2024   MSPIKE Not Observed 09/05/2024     Chemistry      Component Value Date/Time   NA 139 09/23/2024 0825   K 4.5 09/23/2024 0825   CL 105 09/23/2024 0825   CO2 26 09/23/2024 0825   BUN 15 09/23/2024 0825   CREATININE 0.97 09/23/2024 0825      Component Value Date/Time   CALCIUM  9.2 09/23/2024 0825   ALKPHOS 144 (H) 09/23/2024 0825   AST 25 09/23/2024 0825   ALT 30 09/23/2024 0825   BILITOT 0.3 09/23/2024 0825       Impression and Plan: Mr. Puls is a very pleasant 73 yo gentleman with 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   He finally got to [facility] and went through the CAR-T protocol.  He was out there for 40 days.  Again, he had quite a few toxicities from the Carvykti.  Everything is starting to finally turnaround.  He actually is using a special ointment on his feet which have really helped.  This is called  Two Old Goats.  I may have to see if other patients can use this for their neuropathy.  For right now, I will have to give him some Neupogen .  He really needs this in order to get his white cell count up.  I will have him come in before he has his Mohs surgery just for lab work.  I will plan to see him back after Thanksgiving.    Maude  JONELLE Crease, MD 12/2/20258:28 AM

## 2024-10-02 ENCOUNTER — Other Ambulatory Visit: Payer: Self-pay

## 2024-10-02 ENCOUNTER — Other Ambulatory Visit: Payer: Self-pay | Admitting: Hematology & Oncology

## 2024-10-02 DIAGNOSIS — R52 Pain, unspecified: Secondary | ICD-10-CM

## 2024-10-02 DIAGNOSIS — C9001 Multiple myeloma in remission: Secondary | ICD-10-CM

## 2024-10-02 DIAGNOSIS — M79671 Pain in right foot: Secondary | ICD-10-CM

## 2024-10-02 MED ORDER — PREGABALIN 100 MG PO CAPS: 100.0000 mg | ORAL_CAPSULE | Freq: Two times a day (BID) | ORAL | 1 refills | Status: AC

## 2024-10-03 ENCOUNTER — Encounter: Payer: Self-pay | Admitting: Hematology & Oncology

## 2024-10-04 ENCOUNTER — Other Ambulatory Visit: Payer: Self-pay

## 2024-10-07 ENCOUNTER — Encounter (HOSPITAL_COMMUNITY): Payer: Self-pay

## 2024-10-07 NOTE — H&P (Signed)
 Chief Complaint: History of kappa light chain myeloma. Now with leukopenia and thrombocytopenia. Request is for bone marrow biopsy   Referring Physician(s): Ennever,Peter R  Supervising Physician: Philip Cornet  Patient Status: [facility] - Out[facility]pt  History of Present Illness: Bruce Smith is a 73 y.o. male outpatient. History of HTN, HLD, CAD, MI, kappa light chain myeloma with prior pathological fractured to his left scapula and left femur. Found to have progressive leukopenia and thrombocytopenia. Team is requesting a bone marrow biopsy for relapsed myeloma.  Currently without any significant complaints. Patient alert and laying in bed,calm. Denies any fevers, headache, chest pain, SOB, cough, abdominal pain, nausea, vomiting or bleeding.    Prior bone marrow biopsy performed on 9.3.24. ***; labs pending. Patient is on 81 mg of ASA. NKDA. Patient has been NPO since midnight.   Return precautions and treatment recommendations and follow-up discussed with the patient *** who is agreeable with the plan.   Past Medical History:  Diagnosis Date   Atherosclerosis    Coronary artery disease    GERD (gastroesophageal reflux disease)    Goals of care, counseling/discussion 06/29/2020   History of autologous stem cell transplant (HCC)    At [facility] 2022   History of pneumonia    History of radiation therapy    Left Humerus, T-spine, Pelvis- 09/19/22-10/04/22- Dr. Lynwood Nasuti   History of radiation therapy    Right Femur- 01/15/23-01/16/23- Dr. Lynwood Nasuti   History of radiation therapy    Thoracic spine- 06/06/23-06/19/23- Dr. 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   Multiple myeloma (HCC) 07/21/2020   Myocardial infarction [facility])     Past Surgical History:  Procedure Laterality Date   ANTERIOR CERVICAL DECOMP/DISCECTOMY FUSION  N/A 06/17/2020   Procedure: ANTERIOR CERVICAL DECOMPRESSION/DISCECTOMY FUSION, INTERBODY PROSTHESIS, PLATE/SCREWS CERVICAL FIVE- CERVICAL SIX;  Surgeon: Mavis Purchase, MD;  Location: [facility] OR;  Service: Neurosurgery;  Laterality: N/A;  ANTERIOR CERVICAL DECOMPRESSION/DISCECTOMY FUSION, INTERBODY PROSTHESIS, PLATE/SCREWS CERVICAL FIVE- CERVICAL SIX   BIOPSY  02/24/2021   Procedure: BIOPSY;  Surgeon: Elicia Claw, MD;  Location: [facility] ENDOSCOPY;  Service: Gastroenterology;;   BRONCHIAL BIOPSY  02/26/2024   Procedure: BRONCHOSCOPY, WITH BIOPSY;  Surgeon: Shelah Lamar RAMAN, MD;  Location: [facility] ENDOSCOPY;  Service: Pulmonary;;   BRONCHIAL BIOPSY  03/11/2024   Procedure: BRONCHOSCOPY, WITH BIOPSY;  Surgeon: Shelah Lamar RAMAN, MD;  Location: [facility] ENDOSCOPY;  Service: Pulmonary;;   BRONCHIAL BRUSHINGS  02/26/2024   Procedure: BRONCHOSCOPY, WITH BRUSH BIOPSY;  Surgeon: Shelah Lamar RAMAN, MD;  Location: [facility] ENDOSCOPY;  Service: Pulmonary;;   BRONCHIAL BRUSHINGS  03/11/2024   Procedure: BRONCHOSCOPY, WITH BRUSH BIOPSY;  Surgeon: Shelah Lamar RAMAN, MD;  Location: [facility] ENDOSCOPY;  Service: Pulmonary;;   BRONCHIAL NEEDLE ASPIRATION BIOPSY  02/26/2024   Procedure: BRONCHOSCOPY, WITH NEEDLE ASPIRATION BIOPSY;  Surgeon: Shelah Lamar RAMAN, MD;  Location: [facility] ENDOSCOPY;  Service: Pulmonary;;   BRONCHIAL NEEDLE ASPIRATION BIOPSY  03/11/2024   Procedure: BRONCHOSCOPY, WITH NEEDLE ASPIRATION BIOPSY;  Surgeon: Shelah Lamar RAMAN, MD;  Location: [facility] ENDOSCOPY;  Service: Pulmonary;;   BRONCHOSCOPY, WITH BIOPSY USING ELECTROMAGNETIC NAVIGATION Left 03/11/2024   Procedure: BRONCHOSCOPY, WITH BIOPSY USING ELECTROMAGNETIC NAVIGATION;  Surgeon: Shelah Lamar RAMAN, MD;  Location: [facility] ENDOSCOPY;  Service: Pulmonary;  Laterality: Left;  WITH FLUORO   CARDIAC CATHETERIZATION     CORONARY ARTERY BYPASS GRAFT N/A 03/13/2023   Procedure: CORONARY  ARTERY BYPASS GRAFTING (CABG) X3, USING RIGHT LEG GREATER SAPHENOUS VEIN HARVESTED ENDOSCOPICALLY & LEFT IMA;   Surgeon: Lucas Dorise POUR, MD;  Location: [facility] OR;  Service: Open Heart Surgery;  Laterality: N/A;   ESOPHAGOGASTRODUODENOSCOPY N/A 02/24/2021   Procedure: ESOPHAGOGASTRODUODENOSCOPY (EGD);  Surgeon: Elicia Claw, MD;  Location: [facility];  Service: Gastroenterology;  Laterality: N/A;   FEMUR IM NAIL Left 07/14/2020   Procedure: OPEN REDUCTION INTERNAL FIXATION (ORIF LEFT FEMORAL NAIL FRACTURE;  Surgeon: Josefina Chew, MD;  Location: [facility] OR;  Service: Orthopedics;  Laterality: Left;   HEMOSTASIS CLIP PLACEMENT  03/11/2024   Procedure: CONTROL OF HEMORRHAGE, epi and cold saline;  Surgeon: Shelah Lamar RAMAN, MD;  Location: [facility] ENDOSCOPY;  Service: Pulmonary;;   LEFT HEART CATH AND CORONARY ANGIOGRAPHY N/A 03/09/2023   Procedure: LEFT HEART CATH AND CORONARY ANGIOGRAPHY;  Surgeon: Court Dorn PARAS, MD;  Location: [facility] INVASIVE CV LAB;  Service: Cardiovascular;  Laterality: N/A;   TEE WITHOUT CARDIOVERSION N/A 03/13/2023   Procedure: TRANSESOPHAGEAL ECHOCARDIOGRAM;  Surgeon: Lucas Dorise POUR, MD;  Location: [facility] OR;  Service: Open Heart Surgery;  Laterality: N/A;   TONSILLECTOMY     VIDEO BRONCHOSCOPY WITH ENDOBRONCHIAL NAVIGATION Left 02/26/2024   Procedure: VIDEO BRONCHOSCOPY WITH ENDOBRONCHIAL NAVIGATION;  Surgeon: Shelah Lamar RAMAN, MD;  Location: [facility] ENDOSCOPY;  Service: Pulmonary;  Laterality: Left;   VIDEO BRONCHOSCOPY WITH RADIAL ENDOBRONCHIAL ULTRASOUND  02/26/2024   Procedure: VIDEO BRONCHOSCOPY WITH RADIAL ENDOBRONCHIAL ULTRASOUND;  Surgeon: Shelah Lamar RAMAN, MD;  Location: [facility] ENDOSCOPY;  Service: Pulmonary;;    Allergies: Patient has no known allergies.  Medications: Prior to Admission medications   Medication Sig Start Date End Date Taking? Authorizing Provider  aspirin  EC 81 MG tablet Take 81 mg by mouth at bedtime. Patient not taking: Reported on 08/14/2024    [provider]  benzonatate  (TESSALON ) 200 MG capsule Take 1 capsule (200 mg total) by mouth 3 (three) times daily as  needed for cough. 11/16/23   Rizwan, Saima, MD  Calcium  Carbonate (CALCIUM  600 PO) Take 600 mg by mouth daily.    [provider]  cloNIDine (CATAPRES) 0.1 MG tablet Take 0.1 mg by mouth. 07/02/24   [provider]  DULoxetine  (CYMBALTA ) 60 MG capsule TAKE 1 CAPSULE BY MOUTH DAILY 08/31/24   Timmy Maude SAUNDERS, MD  famciclovir  (FAMVIR ) 500 MG tablet Take 500 mg by mouth daily.    [provider]  guaiFENesin  (MUCINEX ) 600 MG 12 hr tablet Take 1 tablet (600 mg total) by mouth 2 (two) times daily as needed for to loosen phlegm. 11/16/23   Rizwan, Saima, MD  loperamide  (IMODIUM  A-D) 2 MG tablet Take 2 mg by mouth 4 (four) times daily as needed for diarrhea or loose stools.    [provider]  loratadine (CLARITIN) 10 MG tablet Take 10 mg by mouth daily. 06/07/24 06/07/25  [provider]  LORazepam  (ATIVAN ) 1 MG tablet Take 1 mg by mouth 3 (three) times daily. 04/08/24   [provider]  Magnesium  250 MG CAPS Take 250 mg by mouth daily. Patient taking differently: Take 500 mg by mouth daily.    [provider]  methylPREDNISolone  (MEDROL  DOSEPAK) 4 MG TBPK tablet Please take as directed by pharmacy. 09/09/24   Timmy Maude SAUNDERS, MD  metoprolol  succinate (TOPROL -XL) 25 MG 24 hr tablet Take 25 mg by mouth daily.    [provider]  naloxone  (NARCAN ) nasal spray 4 mg/0.1 mL Administer as directed in each nostril in the event of an  overdose. May repeat in 15 minutes. Patient not taking: Reported on 08/14/2024 10/17/23   Timmy Maude SAUNDERS, MD  nitroGLYCERIN  (NITROSTAT ) 0.4 MG SL tablet Place 0.4 mg under the tongue every 5 (five) minutes as needed. 06/09/23   [provider]  ondansetron  (ZOFRAN ) 8 MG tablet Take 8 mg by mouth every 8 (eight) hours as needed. 04/01/24   [provider]  pantoprazole  (PROTONIX ) 40 MG tablet TAKE 1 TABLET BY MOUTH 2 TIMES A DAY BEFORE MEALS FOR ACID REFLUX 09/09/24   Timmy Maude SAUNDERS, MD  pentamidine  (PENTAM) 300 MG inhalation solution Take 300 mg by nebulization. 05/08/24   [provider]  polyethylene glycol powder (GLYCOLAX /MIRALAX ) 17 GM/SCOOP powder Take 17 g by mouth daily.    [provider]  pregabalin  (LYRICA ) 100 MG capsule Take 1 capsule (100 mg total) by mouth 2 (two) times daily. 10/02/24   Timmy Maude SAUNDERS, MD  prochlorperazine  (COMPAZINE ) 10 MG tablet Take 1 tablet (10 mg total) by mouth every 6 (six) hours as needed for nausea or vomiting. 10/11/23   Timmy Maude SAUNDERS, MD  rosuvastatin  (CRESTOR ) 40 MG tablet TAKE 1 TABLET BY MOUTH EVERY DAY FOR CHOLESTEROL Patient not taking: Reported on 08/14/2024 02/05/24   Timmy Maude SAUNDERS, MD  tamsulosin  (FLOMAX ) 0.4 MG CAPS capsule TAKE 1 CAPSULE BY MOUTH EVERY NIGHT AT BEDTIME 05/15/24   Ennever, Peter R, MD  tiZANidine (ZANAFLEX) 2 MG tablet Take 2 mg by mouth 2 (two) times daily. Patient not taking: Reported on 08/14/2024 05/15/24   [provider]  traMADol  (ULTRAM ) 50 MG tablet Take 50 mg by mouth every 4 (four) hours as needed. for pain 09/19/24   [provider]     Family History  Problem Relation Age of Onset   Stroke Mother    Prostate cancer Paternal Grandfather     Social History   Socioeconomic History   Marital status: Married    Spouse name: Luke   Number of children: 3   Years of education: Not on file   Highest education level: Not on file  Occupational History   Not on file  Tobacco Use   Smoking status: Former    Current packs/day: 0.00    Average packs/day: 1 pack/day for 15.0 years (15.0 ttl pk-yrs)    Types: Cigarettes    Start date: 11/01/1983    Quit date: 10/31/1998    Years since quitting: 25.9   Smokeless tobacco: Current    Types: Chew  Vaping Use   Vaping status: Never Used  Substance and Sexual Activity   Alcohol use: Not Currently    Comment: rare   Drug use: Never   Sexual activity: Not Currently  Other Topics Concern   Not on file  Social History Narrative    Not on file   Social Drivers of Health   Financial Resource Strain: Patient Declined (04/04/2024)   [facility]   Overall Financial Resource Strain (CARDIA)    Difficulty of Paying Living Expenses: Patient declined  Food Insecurity: No Food Insecurity (04/04/2024)   Received from [facility]   Hunger Vital Sign    Within the past 12 months, you worried that your food would run out before you got the money to buy more.: Never true    Within the past 12 months, the food you bought just didn't last and you didn't have money to get more.: Never true  Transportation Needs: No Transportation Needs (04/04/2024)  Received from [facility] - Transportation    In the past 12 months, has lack of transportation kept you from medical appointments or from getting medications?: No    Lack of Transportation (Non-Medical): No  Physical Activity: Not on file  Stress: Not on file  Social Connections: Moderately Integrated (11/13/2023)   Social Connection and Isolation Panel    Frequency of Communication with Friends and Family: More than three times a week    Frequency of Social Gatherings with Friends and Family: Twice a week    Attends Religious Services: 1 to 4 times per year    Active Member of [employer] or Organizations: No    Attends Banker Meetings: Never    Marital Status: Married    ECOG Status: {CHL ONC ECOG ED:8845999799}  Review of Systems: A 12 point ROS discussed and pertinent positives are indicated in the HPI above.  All other systems are negative.  Review of Systems  Vital Signs: There were no vitals taken for this visit.  Advance Care Plan: {Advance Care Eojw:73180}    Physical Exam  Imaging: No results found.  Labs:  CBC: Recent Labs    09/05/24 0854 09/19/24 0810 09/23/24 0825 10/01/24 0807  WBC 1.3* 4.4 1.9* 1.9*  HGB 11.3* 11.3* 11.2* 11.7*  HCT 34.0* 32.9* 33.9* 34.0*   PLT 68* 36* REPEATED TO VERIFY 66*    COAGS: Recent Labs    11/13/23 0456  INR 1.3*  APTT 35    BMP: Recent Labs    09/05/24 0854 09/19/24 0810 09/23/24 0825 10/01/24 0807  NA 139 136 139 138  K 4.5 4.2 4.5 4.8  CL 102 104 105 104  CO2 27 22 26 24   GLUCOSE 100* 89 72 102*  BUN 23 20 15 18   CALCIUM  9.6 9.3 9.2 9.5  CREATININE 1.03 1.00 0.97 1.00  GFRNONAA >60 >60 >60 >60    LIVER FUNCTION TESTS: Recent Labs    09/05/24 0854 09/19/24 0810 09/23/24 0825 10/01/24 0807  BILITOT 0.3 0.3 0.3 0.3  AST 22 26 25 26   ALT 39 28 30 26   ALKPHOS 153* 137* 144* 133*  PROT 5.7* 5.8* 5.8* 6.0*  ALBUMIN  3.9 3.6 3.9 3.7    TUMOR MARKERS: No results for input(s): AFPTM, CEA, CA199, CHROMGRNA in the last 8760 hours.  Assessment and Plan:  73 y.o. male outpatient. History of HTN, HLD, CAD, MI, kappa light chain myeloma with prior pathological fractured to his left scapula and left femur. Found to have progressive leukopenia and thrombocytopenia. Team is requesting a bone marrow biopsy for relapsed myeloma.   PLAN: IR Image Guided Bone Marrow Biopsy   Risks and benefits of bone marrow biopsy  was discussed with the patient and/or patient's family including, but not limited to bleeding, infection, damage to adjacent structures or low yield requiring additional tests.  All of the questions were answered and there is agreement to proceed.  Consent signed and in chart.   Thank you for this interesting consult.  I greatly enjoyed meeting Bruce Smith and look forward to participating in their care.  A copy of this report was sent to the requesting provider on this date.  Electronically Signed: Delon JAYSON Beagle, NP 10/07/2024, 1:40 PM   I spent a total of {New INPT:304952001} {New Out-Pt:304952002}  {Established Out-Pt:304952003} in face to face in clinical consultation, greater than 50% of which was counseling/coordinating care for ***

## 2024-10-08 ENCOUNTER — Other Ambulatory Visit: Payer: Self-pay

## 2024-10-08 ENCOUNTER — Other Ambulatory Visit: Payer: Self-pay | Admitting: Radiology

## 2024-10-08 DIAGNOSIS — C9000 Multiple myeloma not having achieved remission: Secondary | ICD-10-CM

## 2024-10-09 ENCOUNTER — Ambulatory Visit (HOSPITAL_COMMUNITY): Admission: RE | Admit: 2024-10-09 | Discharge: 2024-10-09 | Attending: Hematology & Oncology

## 2024-10-09 ENCOUNTER — Other Ambulatory Visit: Payer: Self-pay

## 2024-10-09 ENCOUNTER — Ambulatory Visit (HOSPITAL_COMMUNITY)
Admission: RE | Admit: 2024-10-09 | Discharge: 2024-10-09 | Disposition: A | Discharge status: Home / Self Care | Source: Ambulatory Visit | Attending: Hematology & Oncology | Admitting: Hematology & Oncology

## 2024-10-09 ENCOUNTER — Encounter (HOSPITAL_COMMUNITY): Payer: Self-pay

## 2024-10-09 DIAGNOSIS — I10 Essential (primary) hypertension: Secondary | ICD-10-CM | POA: Diagnosis not present

## 2024-10-09 DIAGNOSIS — Z1379 Encounter for other screening for genetic and chromosomal anomalies: Secondary | ICD-10-CM | POA: Insufficient documentation

## 2024-10-09 DIAGNOSIS — I2510 Atherosclerotic heart disease of native coronary artery without angina pectoris: Secondary | ICD-10-CM | POA: Insufficient documentation

## 2024-10-09 DIAGNOSIS — C9000 Multiple myeloma not having achieved remission: Secondary | ICD-10-CM | POA: Diagnosis present

## 2024-10-09 DIAGNOSIS — I252 Old myocardial infarction: Secondary | ICD-10-CM | POA: Insufficient documentation

## 2024-10-09 DIAGNOSIS — D72819 Decreased white blood cell count, unspecified: Secondary | ICD-10-CM | POA: Diagnosis not present

## 2024-10-09 DIAGNOSIS — D696 Thrombocytopenia, unspecified: Secondary | ICD-10-CM | POA: Insufficient documentation

## 2024-10-09 DIAGNOSIS — E785 Hyperlipidemia, unspecified: Secondary | ICD-10-CM | POA: Insufficient documentation

## 2024-10-09 LAB — CBC WITH DIFFERENTIAL/PLATELET
Abs Immature Granulocytes: 0.01 K/uL (ref 0.00–0.07)
Basophils Absolute: 0 K/uL (ref 0.0–0.1)
Basophils Relative: 1 %
Eosinophils Absolute: 0 K/uL (ref 0.0–0.5)
Eosinophils Relative: 2 %
HCT: 39.7 % (ref 39.0–52.0)
Hemoglobin: 13 g/dL (ref 13.0–17.0)
Immature Granulocytes: 1 %
Lymphocytes Relative: 20 %
Lymphs Abs: 0.3 K/uL — ABNORMAL LOW (ref 0.7–4.0)
MCH: 34.3 pg — ABNORMAL HIGH (ref 26.0–34.0)
MCHC: 32.7 g/dL (ref 30.0–36.0)
MCV: 104.7 fL — ABNORMAL HIGH (ref 80.0–100.0)
Monocytes Absolute: 0.6 K/uL (ref 0.1–1.0)
Monocytes Relative: 46 %
Neutro Abs: 0.4 K/uL — CL (ref 1.7–7.7)
Neutrophils Relative %: 30 %
Platelets: 62 K/uL — ABNORMAL LOW (ref 150–400)
RBC: 3.79 MIL/uL — ABNORMAL LOW (ref 4.22–5.81)
RDW: 14.3 % (ref 11.5–15.5)
Smear Review: NORMAL
WBC: 1.4 K/uL — CL (ref 4.0–10.5)
nRBC: 0 % (ref 0.0–0.2)

## 2024-10-09 MED ORDER — MIDAZOLAM HCL 2 MG/2ML IJ SOLN
INTRAMUSCULAR | Status: AC
  Filled 2024-10-09: qty 2

## 2024-10-09 MED ORDER — FENTANYL CITRATE (PF) 100 MCG/2ML IJ SOLN
INTRAMUSCULAR | Status: AC
  Filled 2024-10-09: qty 2

## 2024-10-09 MED ORDER — MIDAZOLAM HCL (PF) 2 MG/2ML IJ SOLN
INTRAMUSCULAR | Status: AC | PRN
  Administered 2024-10-09: 1 mg via INTRAVENOUS

## 2024-10-09 MED ORDER — SODIUM CHLORIDE 0.9 % IV SOLN: INTRAVENOUS | Status: DC

## 2024-10-09 MED ORDER — FENTANYL CITRATE (PF) 100 MCG/2ML IJ SOLN
INTRAMUSCULAR | Status: AC | PRN
  Administered 2024-10-09: 50 ug via INTRAVENOUS

## 2024-10-09 NOTE — Discharge Instructions (Addendum)
 Please call Interventional Radiology clinic [phone] with any questions or concerns.  You may remove your dressing and shower tomorrow.        Bone Marrow Aspiration and Bone Marrow Biopsy, Adult, Care After  This sheet gives you information about how to care for yourself after your procedure. Your health care provider may also give you more specific instructions. If you have problems or questions, contact your health care provider. What can I expect after the procedure? After the procedure, it is common to have: Mild pain and tenderness. Swelling. Bruising. Follow these instructions at home: Puncture site care  Follow instructions from your health care provider about how to take care of the puncture site. Make sure you: Wash your hands with soap and water  before and after you change your bandage (dressing). If soap and water  are not available, use hand sanitizer. Change your dressing as told by your health care provider. Check your puncture site every day for signs of infection. Check for: More redness, swelling, or pain. Fluid or blood. Warmth. Pus or a bad smell. Activity Return to your normal activities as told by your health care provider. Ask your health care provider what activities are safe for you. Do not lift anything that is heavier than 10 lb (4.5 kg), or the limit that you are told, until your health care provider says that it is safe. Do not drive for 24 hours if you were given a sedative during your procedure. General instructions  Take over-the-counter and prescription medicines only as told by your health care provider. Do not take baths, swim, or use a hot tub until your health care provider approves. Ask your health care provider if you may take showers. You may only be allowed to take sponge baths. If directed, put ice on the affected area. To do this: Put ice in a plastic bag. Place a towel between your skin and the bag. Leave the ice on for 20 minutes,  2-3 times a day. Keep all follow-up visits as told by your health care provider. This is important. Contact a health care provider if: Your pain is not controlled with medicine. You have a fever. You have more redness, swelling, or pain around the puncture site. You have fluid or blood coming from the puncture site. Your puncture site feels warm to the touch. You have pus or a bad smell coming from the puncture site. Summary After the procedure, it is common to have mild pain, tenderness, swelling, and bruising. Follow instructions from your health care provider about how to take care of the puncture site and what activities are safe for you. Take over-the-counter and prescription medicines only as told by your health care provider. Contact a health care provider if you have any signs of infection, such as fluid or blood coming from the puncture site. This information is not intended to replace advice given to you by your health care provider. Make sure you discuss any questions you have with your health care provider. Document Revised: 03/05/2019 Document Reviewed: 03/05/2019 Elsevier Patient Education  2023 Elsevier Inc.    Moderate Conscious Sedation, Adult, Care After  This sheet gives you information about how to care for yourself after your procedure. Your health care provider may also give you more specific instructions. If you have problems or questions, contact your health care provider. What can I expect after the procedure? After the procedure, it is common to have: Sleepiness for several hours. Impaired judgment for several hours. Difficulty with  balance. Vomiting if you eat too soon. Follow these instructions at home: For the time period you were told by your health care provider: Rest. Do not participate in activities where you could fall or become injured. Do not drive or use machinery. Do not drink alcohol. Do not take sleeping pills or medicines that cause  drowsiness. Do not make important decisions or sign legal documents. Do not take care of children on your own. Eating and drinking  Follow the diet recommended by your health care provider. Drink enough fluid to keep your urine pale yellow. If you vomit: Drink water , juice, or soup when you can drink without vomiting. Make sure you have little or no nausea before eating solid foods. General instructions Take over-the-counter and prescription medicines only as told by your health care provider. Have a responsible adult stay with you for the time you are told. It is important to have someone help care for you until you are awake and alert. Do not smoke. Keep all follow-up visits as told by your health care provider. This is important. Contact a health care provider if: You are still sleepy or having trouble with balance after 24 hours. You feel light-headed. You keep feeling nauseous or you keep vomiting. You develop a rash. You have a fever. You have redness or swelling around the IV site. Get help right away if: You have trouble breathing. You have new-onset confusion at home. Summary After the procedure, it is common to feel sleepy, have impaired judgment, or feel nauseous if you eat too soon. Rest after you get home. Know the things you should not do after the procedure. Follow the diet recommended by your health care provider and drink enough fluid to keep your urine pale yellow. Get help right away if you have trouble breathing or new-onset confusion at home. This information is not intended to replace advice given to you by your health care provider. Make sure you discuss any questions you have with your health care provider. Document Revised: 02/14/2020 Document Reviewed: 09/12/2019 Elsevier Patient Education  2023 Elsevier Inc.  Discharge Instructions:   Please call Interventional Radiology clinic [phone] with any questions or concerns.  You may remove your dressing  and shower tomorrow.  Do not use EMLA / Lidocaine  cream for 2 weeks post Port Insertion.

## 2024-10-09 NOTE — Sedation Documentation (Signed)
 RN Rosina SAUNDERS and Cordella N pulled 2mg  Versed  and 100mcg Fentanyl  in CT1. Pt. Received 2mg  Versed  and 50mcg Fentanyl  throughout the procedure. Pt tolerated procedure well. RN Rosina wasted 50mcg Fentanyl  with RN Cordella.

## 2024-10-09 NOTE — Procedures (Signed)
 Vascular and Interventional Radiology Procedure Note  Patient: Bruce Smith DOB: 09/25/1951 Medical Record Number: [id number] Note Date/Time: 10/09/24 9:16 AM   Performing Physician: Thom Hall, MD Assistant(s): None  Diagnosis: Relapsed fMM. Prior BMBxs   Procedure: BONE MARROW ASPIRATION and BIOPSY  Anesthesia: Conscious Sedation Complications: None Estimated Blood Loss: Minimal Specimens: Sent for Pathology  Findings:  Successful CT-guided bone marrow aspiration and biopsy A total of 1 cores were obtained. Hemostasis of the tract was achieved using Manual Pressure.  Plan: Bed rest for 1 hours.  See detailed procedure note with images in PACS. The patient tolerated the procedure well without incident or complication and was returned to Recovery in stable condition.    Thom Hall, MD [facility] Rad[facility]   Pager. [phone] Clinic. [phone]

## 2024-10-09 NOTE — Progress Notes (Signed)
 0800 Took Clindamycin and Doxycyline 5 days total per wife.  Has a rash on arms and scaly for the past two weeks. Plans to call to get a re-evaluation.

## 2024-10-11 LAB — SURGICAL PATHOLOGY

## 2024-10-14 ENCOUNTER — Inpatient Hospital Stay: Admitting: Hematology & Oncology

## 2024-10-14 ENCOUNTER — Encounter (HOSPITAL_COMMUNITY): Payer: Self-pay

## 2024-10-14 ENCOUNTER — Inpatient Hospital Stay

## 2024-10-21 ENCOUNTER — Encounter (HOSPITAL_COMMUNITY): Payer: Self-pay

## 2024-10-22 ENCOUNTER — Inpatient Hospital Stay

## 2024-10-22 ENCOUNTER — Encounter: Payer: Self-pay | Admitting: Medical Oncology

## 2024-10-22 ENCOUNTER — Other Ambulatory Visit: Payer: Self-pay

## 2024-10-22 ENCOUNTER — Inpatient Hospital Stay: Admitting: Medical Oncology

## 2024-10-22 VITALS — BP 118/68 | HR 65 | Temp 97.0°F | Resp 18

## 2024-10-22 DIAGNOSIS — C9000 Multiple myeloma not having achieved remission: Secondary | ICD-10-CM

## 2024-10-22 DIAGNOSIS — D801 Nonfamilial hypogammaglobulinemia: Secondary | ICD-10-CM | POA: Diagnosis not present

## 2024-10-22 DIAGNOSIS — C9001 Multiple myeloma in remission: Secondary | ICD-10-CM

## 2024-10-22 DIAGNOSIS — C419 Malignant neoplasm of bone and articular cartilage, unspecified: Secondary | ICD-10-CM

## 2024-10-22 LAB — CBC WITH DIFFERENTIAL (CANCER CENTER ONLY)
Abs Immature Granulocytes: 0.03 K/uL (ref 0.00–0.07)
Basophils Absolute: 0 K/uL (ref 0.0–0.1)
Basophils Relative: 0 %
Eosinophils Absolute: 0.1 K/uL (ref 0.0–0.5)
Eosinophils Relative: 2 %
HCT: 32.5 % — ABNORMAL LOW (ref 39.0–52.0)
Hemoglobin: 10.9 g/dL — ABNORMAL LOW (ref 13.0–17.0)
Immature Granulocytes: 1 %
Lymphocytes Relative: 9 %
Lymphs Abs: 0.3 K/uL — ABNORMAL LOW (ref 0.7–4.0)
MCH: 34.1 pg — ABNORMAL HIGH (ref 26.0–34.0)
MCHC: 33.5 g/dL (ref 30.0–36.0)
MCV: 101.6 fL — ABNORMAL HIGH (ref 80.0–100.0)
Monocytes Absolute: 0.6 K/uL (ref 0.1–1.0)
Monocytes Relative: 19 %
Neutro Abs: 2.2 K/uL (ref 1.7–7.7)
Neutrophils Relative %: 69 %
Platelet Count: 59 K/uL — ABNORMAL LOW (ref 150–400)
RBC: 3.2 MIL/uL — ABNORMAL LOW (ref 4.22–5.81)
RDW: 14.6 % (ref 11.5–15.5)
WBC Count: 3.2 K/uL — ABNORMAL LOW (ref 4.0–10.5)
nRBC: 0 % (ref 0.0–0.2)

## 2024-10-22 LAB — CMP (CANCER CENTER ONLY)
ALT: 23 U/L (ref 0–44)
AST: 22 U/L (ref 15–41)
Albumin: 3.8 g/dL (ref 3.5–5.0)
Alkaline Phosphatase: 117 U/L (ref 38–126)
Anion gap: 9 (ref 5–15)
BUN: 16 mg/dL (ref 8–23)
CO2: 27 mmol/L (ref 22–32)
Calcium: 9.2 mg/dL (ref 8.9–10.3)
Chloride: 104 mmol/L (ref 98–111)
Creatinine: 1.12 mg/dL (ref 0.61–1.24)
GFR, Estimated: >60 mL/min
Glucose, Bld: 75 mg/dL (ref 70–99)
Potassium: 4.8 mmol/L (ref 3.5–5.1)
Sodium: 141 mmol/L (ref 135–145)
Total Bilirubin: 0.3 mg/dL (ref 0.0–1.2)
Total Protein: 5.7 g/dL — ABNORMAL LOW (ref 6.5–8.1)

## 2024-10-22 LAB — SAVE SMEAR(SSMR), FOR PROVIDER SLIDE REVIEW

## 2024-10-22 LAB — LACTATE DEHYDROGENASE: LDH: 224 U/L (ref 105–235)

## 2024-10-22 LAB — MAGNESIUM: Magnesium: 2 mg/dL (ref 1.7–2.4)

## 2024-10-22 NOTE — Progress Notes (Signed)
 " Hematology and Oncology Follow Up Visit  Bruce Smith [id number] 10-11-1951 73 y.o. 10/22/2024   Principle Diagnosis:  Kappa light chain myeloma-extensive bone disease-pathologic fracture of left scapula and left femur - t(11:14), 13q-, 1p-,1q-  Past Therapy: Status post surgical repair of left femur-07/14/2020 XRT to left shoulder and left hip Xgeva  120 mg subcu every 3 months-next dose in December 2022  Faspro/Velcade /Revlimid /Decadron  -- s/p cycle #2 -- start on10/04/2020  ( Revlimid  is 14 on /14 off)  ASCT -- [facility] on -01/22/2021 Faspro - q month -- maintenance -- start on 05/13/2021 -- d/c on 10/10/2023 Radiation therapy to T12, left iliac and left humerus --completed on 10/04/2022 --XRT to right femoral head -start 01/03/2023 XRT to T9 lesion -to be completed on 06/18/2023 Kyprolis /Pomalyst  -- s/p cycle #3 - start on 10/19/2023 Carvykti -  given at [facility] on 03/27/2024   Current Therapy:        Zometa  4 mg IV every 3 months-next dose in 12/2024   IVIG 40 g monthly- last given on 09/05/2024  Supportive Care: (As directed by [facility]) Transfuse prn hgb <=8gm/dl and plts <=89X. G-CSF 480 mcg subcu if ANC is 1.0 or less to be administered weekly   Interim History:  Bruce Smith is here today for follow-up. He continues to have troubles with leukopenia, neutropenia and thrombocytopenia.  He is followed by [facility]. His last visit to their office was on 10/15/2024. At this time it was recommended that he get weekly labs here in our office to monitor his CBC abnormalities. Per [facility]'s recommendations, he is supposed to get the supportive care listed above PRN.   Recent 10/15/2024 cytogenetics were normal (though only 15 cells were studied)  He has had a rash of his body for quite some time. This was originally thought to be secondary to Doxycyline. He is being followed for this by his team at [facility] and symptoms are slowly improving. He is now on a topical steroid cream. No bleeding or  infections.   He has had no problem with infections. He has had no cough or shortness of breath.    He has had no mouth sores.  He has had improvement with his strength.  Appetite is down a bit due to restarting two medications that tend to change the flavor of his food. [facility] is monitoring this closely.   No night sweats. No new bone pains.   Overall, I would have to say that his performance status is probably ECOG 1.  Wt Readings from Last 3 Encounters:  10/09/24 177 lb 14.6 oz (80.7 kg)  10/01/24 178 lb (80.7 kg)  09/05/24 181 lb (82.1 kg)     Medications:  Allergies as of 10/22/2024       Reactions   Clindamycin/lincomycin Rash   Doxycycline  Rash        Medication List        Accurate as of October 22, 2024  3:52 PM. If you have any questions, ask your nurse or doctor.          aspirin  EC 81 MG tablet Take 81 mg by mouth at bedtime.   benzonatate  200 MG capsule Commonly known as: TESSALON  Take 1 capsule (200 mg total) by mouth 3 (three) times daily as needed for cough.   CALCIUM  600 PO Take 600 mg by mouth daily.   cloNIDine 0.1 MG tablet Commonly known as: CATAPRES Take 0.1 mg by mouth.   DULoxetine  60 MG capsule Commonly known as: CYMBALTA  TAKE 1  CAPSULE BY MOUTH DAILY   famciclovir  500 MG tablet Commonly known as: FAMVIR  Take 500 mg by mouth daily.   guaiFENesin  600 MG 12 hr tablet Commonly known as: MUCINEX  Take 1 tablet (600 mg total) by mouth 2 (two) times daily as needed for to loosen phlegm.   loperamide  2 MG tablet Commonly known as: IMODIUM  A-D Take 2 mg by mouth 4 (four) times daily as needed for diarrhea or loose stools.   loratadine 10 MG tablet Commonly known as: CLARITIN Take 10 mg by mouth daily.   LORazepam  1 MG tablet Commonly known as: ATIVAN  Take 1 mg by mouth 3 (three) times daily. What changed:  when to take this reasons to take this   Magnesium  250 MG Caps Take 250 mg by mouth daily. What changed: how much to  take   methylPREDNISolone  4 MG Tbpk tablet Commonly known as: MEDROL  DOSEPAK Please take as directed by pharmacy.   metoprolol  succinate 25 MG 24 hr tablet Commonly known as: TOPROL -XL Take 25 mg by mouth daily.   naloxone  4 MG/0.1ML Liqd nasal spray kit Commonly known as: NARCAN  Administer as directed in each nostril in the event of an overdose. May repeat in 15 minutes.   nitroGLYCERIN  0.4 MG SL tablet Commonly known as: NITROSTAT  Place 0.4 mg under the tongue every 5 (five) minutes as needed.   ondansetron  8 MG tablet Commonly known as: ZOFRAN  Take 8 mg by mouth every 8 (eight) hours as needed.   pantoprazole  40 MG tablet Commonly known as: PROTONIX  TAKE 1 TABLET BY MOUTH 2 TIMES A DAY BEFORE MEALS FOR ACID REFLUX   pentamidine 300 MG inhalation solution Commonly known as: PENTAM Take 300 mg by nebulization.   polyethylene glycol powder 17 GM/SCOOP powder Commonly known as: GLYCOLAX /MIRALAX  Take 17 g by mouth daily.   pregabalin  100 MG capsule Commonly known as: LYRICA  Take 1 capsule (100 mg total) by mouth 2 (two) times daily.   prochlorperazine  10 MG tablet Commonly known as: COMPAZINE  Take 1 tablet (10 mg total) by mouth every 6 (six) hours as needed for nausea or vomiting.   rosuvastatin  40 MG tablet Commonly known as: CRESTOR  TAKE 1 TABLET BY MOUTH EVERY DAY FOR CHOLESTEROL   tamsulosin  0.4 MG Caps capsule Commonly known as: FLOMAX  TAKE 1 CAPSULE BY MOUTH EVERY NIGHT AT BEDTIME   tiZANidine 2 MG tablet Commonly known as: ZANAFLEX Take 2 mg by mouth 2 (two) times daily.   traMADol  50 MG tablet Commonly known as: ULTRAM  Take 50 mg by mouth every 4 (four) hours as needed. for pain        Allergies:  Allergies  Allergen Reactions   Clindamycin/Lincomycin Rash   Doxycycline  Rash    Past Medical History, Surgical history, Social history, and Family History were reviewed and updated.  Review of Systems: Review of Systems  Constitutional:  Negative.   HENT: Negative.    Eyes: Negative.   Respiratory: Negative.    Cardiovascular: Negative.   Gastrointestinal: Negative.   Genitourinary: Negative.   Musculoskeletal:  Negative for joint pain.  Skin: Negative.   Neurological: Negative.   Endo/Heme/Allergies: Negative.   Psychiatric/Behavioral: Negative.       Physical Exam:  Vitals:   10/22/24 1439  BP: 118/68  Pulse: 65  Resp: 18  Temp: (!) 97 F (36.1 C)  SpO2: 97%     Wt Readings from Last 3 Encounters:  10/09/24 177 lb 14.6 oz (80.7 kg)  10/01/24 178 lb (80.7 kg)  09/05/24 181  lb (82.1 kg)    Physical Exam Vitals reviewed.  Constitutional:      Comments: This is a well-developed and well-nourished white male in no obvious distress.  He has a nicely healed median sternotomy scar.  HENT:     Head: Normocephalic and atraumatic.  Eyes:     Pupils: Pupils are equal, round, and reactive to light.  Cardiovascular:     Rate and Rhythm: Normal rate and regular rhythm.     Heart sounds: Normal heart sounds.  Pulmonary:     Effort: Pulmonary effort is normal.     Breath sounds: Normal breath sounds.  Abdominal:     General: Bowel sounds are normal.     Palpations: Abdomen is soft.  Musculoskeletal:        General: No tenderness or deformity. Normal range of motion.     Cervical back: Normal range of motion.  Lymphadenopathy:     Cervical: No cervical adenopathy.  Skin:    General: Skin is warm and dry.     Findings: No erythema or rash.  Neurological:     Mental Status: He is alert and oriented to person, place, and time.  Psychiatric:        Behavior: Behavior normal.        Thought Content: Thought content normal.        Judgment: Judgment normal.      Lab Results  Component Value Date   WBC 3.2 (L) 10/22/2024   HGB 10.9 (L) 10/22/2024   HCT 32.5 (L) 10/22/2024   MCV 101.6 (H) 10/22/2024   PLT 59 (L) 10/22/2024   Lab Results  Component Value Date   FERRITIN 135 11/15/2023   IRON 35  (L) 11/15/2023   TIBC 225 (L) 11/15/2023   UIBC 190 11/15/2023   IRONPCTSAT 16 (L) 11/15/2023   Lab Results  Component Value Date   RETICCTPCT 1.3 11/14/2023   RBC 3.20 (L) 10/22/2024   Lab Results  Component Value Date   KPAFRELGTCHN <0.7 (L) 09/05/2024   LAMBDASER <1.5 (L) 09/05/2024   KAPLAMBRATIO 0.50 (L) 09/09/2024   Lab Results  Component Value Date   IGGSERUM 302 (L) 09/05/2024   IGGSERUM 330 (L) 09/05/2024   IGA <5 (L) 09/05/2024   IGA <5 (L) 09/05/2024   IGMSERUM <5 (L) 09/05/2024   IGMSERUM <5 (L) 09/05/2024   Lab Results  Component Value Date   TOTALPROTELP 5.4 (L) 09/05/2024   ALBUMINELP 3.4 09/05/2024   A1GS 0.3 09/05/2024   A2GS 0.8 09/05/2024   BETS 0.8 09/05/2024   GAMS 0.2 (L) 09/05/2024   MSPIKE Not Observed 09/05/2024     Chemistry      Component Value Date/Time   NA 141 10/22/2024 1416   K 4.8 10/22/2024 1416   CL 104 10/22/2024 1416   CO2 27 10/22/2024 1416   BUN 16 10/22/2024 1416   CREATININE 1.12 10/22/2024 1416      Component Value Date/Time   CALCIUM  9.2 10/22/2024 1416   ALKPHOS 117 10/22/2024 1416   AST 22 10/22/2024 1416   ALT 23 10/22/2024 1416   BILITOT 0.3 10/22/2024 1416     Encounter Diagnoses  Name Primary?   Multiple myeloma in remission (HCC) Yes   Hypomagnesemia    Hypogammaglobulinemia    Impression and Plan: Bruce Smith is a very pleasant 73 yo gentleman with 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 He is now s/p CAR-T protocol with Carvykti starting on  03/27/2024. Unfortunately he had quite a few toxicities from the Carvykti.  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 Currently we see him weekly for lab monitoring and considering of GCSF/blood products. He also gets IVIG monthly however his last infusion was on 09/05/2024.   Recent BMB cytogenetics were reassuring (10/21/2024) Today CBC shows improvement in his WBC and ANC - now 3.2 and 2.2 from 1.4  and 0.4 Hgb down slightly from 13 to 10.9- no known bleeding.  Platelets essentially stable at 59 without bleeding No transfusion needed.  No GCSF needed.  Myeloma labs pending He will see us  next week for labs and possible GCSF/Blood products  He returns to [facility] on 11/08/2024 for labs and to see Dr. Rayfield  MRI brain and spine on 11/22/2024  He will need to follow up with our office in 1-4 weeks following his visit with Dr. Rayfield at their advisement as his Neutropenia has significantly improved. I suspect that he will need to be seen every 2 weeks.   I am also going to reach out to our team for confirmation regarding his IVIG as it appears that this was last given on 09/05/2024. Last IgG level was 438 on 10/15/2024. If needed we will add him on to the schedule to receive this when we sees us  next week for labs and possible blood products.   Lauraine CHRISTELLA Dais, PA-C 12/23/20253:52 PM "

## 2024-10-23 LAB — IGG, IGA, IGM
IgA: <5 mg/dL — ABNORMAL LOW (ref 61–437)
IgG (Immunoglobin G), Serum: 467 mg/dL — ABNORMAL LOW (ref 603–1613)
IgM (Immunoglobulin M), Srm: <5 mg/dL — ABNORMAL LOW (ref 15–143)

## 2024-10-23 LAB — KAPPA/LAMBDA LIGHT CHAINS
Kappa free light chain: <0.7 mg/L — ABNORMAL LOW (ref 3.3–19.4)
Kappa, lambda light chain ratio: UNDETERMINED
Lambda free light chains: <1.5 mg/L — ABNORMAL LOW (ref 5.7–26.3)

## 2024-10-24 ENCOUNTER — Ambulatory Visit: Payer: Self-pay | Admitting: Hematology & Oncology

## 2024-10-28 ENCOUNTER — Other Ambulatory Visit: Payer: Self-pay | Admitting: Family

## 2024-10-28 DIAGNOSIS — C9000 Multiple myeloma not having achieved remission: Secondary | ICD-10-CM

## 2024-10-29 ENCOUNTER — Inpatient Hospital Stay

## 2024-10-29 VITALS — BP 141/65 | HR 56 | Temp 98.2°F | Resp 18

## 2024-10-29 DIAGNOSIS — C419 Malignant neoplasm of bone and articular cartilage, unspecified: Secondary | ICD-10-CM

## 2024-10-29 DIAGNOSIS — C9000 Multiple myeloma not having achieved remission: Secondary | ICD-10-CM

## 2024-10-29 LAB — CBC WITH DIFFERENTIAL (CANCER CENTER ONLY)
Abs Immature Granulocytes: 0.01 K/uL (ref 0.00–0.07)
Basophils Absolute: 0 K/uL (ref 0.0–0.1)
Basophils Relative: 1 %
Eosinophils Absolute: 0.1 K/uL (ref 0.0–0.5)
Eosinophils Relative: 3 %
HCT: 31.9 % — ABNORMAL LOW (ref 39.0–52.0)
Hemoglobin: 10.7 g/dL — ABNORMAL LOW (ref 13.0–17.0)
Immature Granulocytes: 1 %
Lymphocytes Relative: 19 %
Lymphs Abs: 0.4 K/uL — ABNORMAL LOW (ref 0.7–4.0)
MCH: 34 pg (ref 26.0–34.0)
MCHC: 33.5 g/dL (ref 30.0–36.0)
MCV: 101.3 fL — ABNORMAL HIGH (ref 80.0–100.0)
Monocytes Absolute: 0.5 K/uL (ref 0.1–1.0)
Monocytes Relative: 23 %
Neutro Abs: 1.2 K/uL — ABNORMAL LOW (ref 1.7–7.7)
Neutrophils Relative %: 53 %
Platelet Count: 58 K/uL — ABNORMAL LOW (ref 150–400)
RBC: 3.15 MIL/uL — ABNORMAL LOW (ref 4.22–5.81)
RDW: 15 % (ref 11.5–15.5)
Smear Review: NORMAL
WBC Count: 2.2 K/uL — ABNORMAL LOW (ref 4.0–10.5)
nRBC: 0 % (ref 0.0–0.2)

## 2024-10-29 LAB — PROTEIN ELECTROPHORESIS, SERUM, WITH REFLEX
A/G Ratio: 1.2 (ref 0.7–1.7)
Albumin ELP: 2.9 g/dL (ref 2.9–4.4)
Alpha-1-Globulin: 0.3 g/dL (ref 0.0–0.4)
Alpha-2-Globulin: 0.8 g/dL (ref 0.4–1.0)
Beta Globulin: 0.8 g/dL (ref 0.7–1.3)
Gamma Globulin: 0.5 g/dL (ref 0.4–1.8)
Globulin, Total: 2.4 g/dL (ref 2.2–3.9)
SPEP Interpretation: 0
Total Protein ELP: 5.3 g/dL — ABNORMAL LOW (ref 6.0–8.5)

## 2024-10-29 LAB — CMP (CANCER CENTER ONLY)
ALT: 39 U/L (ref 0–44)
AST: 34 U/L (ref 15–41)
Albumin: 3.8 g/dL (ref 3.5–5.0)
Alkaline Phosphatase: 109 U/L (ref 38–126)
Anion gap: 10 (ref 5–15)
BUN: 23 mg/dL (ref 8–23)
CO2: 26 mmol/L (ref 22–32)
Calcium: 9.5 mg/dL (ref 8.9–10.3)
Chloride: 105 mmol/L (ref 98–111)
Creatinine: 1.18 mg/dL (ref 0.61–1.24)
GFR, Estimated: >60 mL/min
Glucose, Bld: 82 mg/dL (ref 70–99)
Potassium: 4.9 mmol/L (ref 3.5–5.1)
Sodium: 141 mmol/L (ref 135–145)
Total Bilirubin: 0.2 mg/dL (ref 0.0–1.2)
Total Protein: 6 g/dL — ABNORMAL LOW (ref 6.5–8.1)

## 2024-10-29 LAB — IMMUNOFIXATION REFLEX, SERUM
IgA: <5 mg/dL — ABNORMAL LOW (ref 61–437)
IgG (Immunoglobin G), Serum: 485 mg/dL — ABNORMAL LOW (ref 603–1613)
IgM (Immunoglobulin M), Srm: <5 mg/dL — ABNORMAL LOW (ref 15–143)

## 2024-10-29 MED ORDER — DEXTROSE 5 % IV SOLN: INTRAVENOUS | Status: DC

## 2024-10-29 MED ORDER — ACETAMINOPHEN 325 MG PO TABS
650.0000 mg | ORAL_TABLET | Freq: Once | ORAL | Status: AC
  Administered 2024-10-29: 650 mg via ORAL
  Filled 2024-10-29: qty 2

## 2024-10-29 MED ORDER — IMMUNE GLOBULIN (HUMAN) 20 GM/200ML IV SOLN
40.0000 g | Freq: Once | INTRAVENOUS | Status: AC
  Administered 2024-10-29: 40 g via INTRAVENOUS
  Filled 2024-10-29: qty 400

## 2024-10-29 MED ORDER — DIPHENHYDRAMINE HCL 25 MG PO CAPS
25.0000 mg | ORAL_CAPSULE | Freq: Once | ORAL | Status: AC
  Administered 2024-10-29: 25 mg via ORAL
  Filled 2024-10-29: qty 1

## 2024-10-29 NOTE — Patient Instructions (Signed)
 Immune Globulin  Injection What is this medication? IMMUNE GLOBULIN  (im MUNE GLOB yoo lin) treats many immune system conditions. It works by Designer, multimedia extra antibodies. Antibodies are proteins made by the immune system that help protect the body. This medicine may be used for other purposes; ask your health care provider or pharmacist if you have questions. COMMON BRAND NAME(S): ASCENIV, Baygam, BIVIGAM, Carimune, Carimune NF, cutaquig, Cuvitru, Flebogamma, Flebogamma DIF, GamaSTAN, GamaSTAN S/D, Gamimune N, Gammagard, Gammagard S/D, Gammaked, Gammaplex, Gammar-P IV, Gamunex, Gamunex-C, Hizentra, Iveegam, Iveegam EN, Octagam, Panglobulin, Panglobulin NF, panzyga, Polygam S/D, Privigen , Sandoglobulin, Venoglobulin-S, Vigam, Vivaglobulin, Xembify What should I tell my care team before I take this medication? They need to know if you have any of these conditions: Blood clotting disorder Condition where you have excess fluid in your body, such as heart failure or edema Dehydration Diabetes Have had blood clots Heart disease Immune system conditions Kidney disease Low levels of IgA Recent or upcoming vaccine An unusual or allergic reaction to immune globulin , other medications, foods, dyes, or preservatives Pregnant or trying to get pregnant Breastfeeding How should I use this medication? This medication is infused into a vein or under the skin. It may also be injected into a muscle. It is usually given by your care team in a hospital or clinic setting. It may also be given at home. If you get this medication at home, you will be taught how to prepare and give it. Take it as directed on the prescription label. Keep taking it unless your care team tells you to stop. It is important that you put your used needles and syringes in a special sharps container. Do not put them in a trash can. If you do not have a sharps container, call your pharmacist or care team to get one. Talk to your care team  about the use of this medication in children. While it may be given to children for selected conditions, precautions do apply. Overdosage: If you think you have taken too much of this medicine contact a poison control center or emergency room at once. NOTE: This medicine is only for you. Do not share this medicine with others. What if I miss a dose? If you get this medication at the hospital or clinic: It is important not to miss your dose. Call your care team if you are unable to keep an appointment. If you give yourself this medication at home: If you miss a dose, take it as soon as you can. Then continue your normal schedule. If it is almost time for your next dose, take only that dose. Do not take double or extra doses. Call your care team with questions. What may interact with this medication? Live virus vaccines This list may not describe all possible interactions. Give your health care provider a list of all the medicines, herbs, non-prescription drugs, or dietary supplements you use. Also tell them if you smoke, drink alcohol , or use illegal drugs. Some items may interact with your medicine. What should I watch for while using this medication? Your condition will be monitored carefully while you are receiving this medication. Tell your care team if your symptoms do not start to get better or if they get worse. You may need blood work done while you are taking this medication. This medication increases the risk of blood clots. People with heart, blood vessel, or blood clotting conditions are more likely to develop a blood clot. Other risk factors include advanced age, estrogen use, tobacco  use, lack of movement, and being overweight. This medication can decrease the response to a vaccine. If you need to get vaccinated, tell your care team if you have received this medication within the last year. Extra booster doses may be needed. Talk to your care team to see if a different vaccination schedule  is needed. This medication is made from donated human blood. There is a small risk it may contain bacteria or viruses, such as hepatitis or HIV. All products are processed to kill most bacteria and viruses. Talk to your care team if you have questions about the risk of infection. If you have diabetes, talk to your care team about which device you should use to check your blood sugar. This medication may cause some devices to report falsely high blood sugar levels. This may cause you to react by not treating a low blood sugar level or by giving an insulin  dose that was not needed. This can cause severe low blood sugar levels. What side effects may I notice from receiving this medication? Side effects that you should report to your care team as soon as possible: Allergic reactions--skin rash, itching, hives, swelling of the face, lips, tongue, or throat Blood clot--pain, swelling, or warmth in the leg, shortness of breath, chest pain Fever, neck pain or stiffness, sensitivity to light, headache, nausea, vomiting, confusion, which may be signs of meningitis Hemolytic anemia--unusual weakness or fatigue, dizziness, headache, trouble breathing, dark urine, yellowing skin or eyes Kidney injury--decrease in the amount of urine, swelling of the ankles, hands, or feet Low sodium level--muscle weakness, fatigue, dizziness, headache, confusion Shortness of breath or trouble breathing, cough, unusual weakness or fatigue, blue skin or lips Side effects that usually do not require medical attention (report these to your care team if they continue or are bothersome): Chills Diarrhea Fever Headache Nausea This list may not describe all possible side effects. Call your doctor for medical advice about side effects. You may report side effects to FDA at 1-800-FDA-1088. Where should I keep my medication? Keep out of the reach of children and pets. You will be instructed on how to store this medication. Get rid of  any unused medication after the expiration date. To get rid of medications that are no longer needed or have expired: Take the medication to a medication take-back program. Check with your pharmacy or law enforcement to find a location. If you cannot return the medication, ask your pharmacist or care team how to get rid of this medication safely. NOTE: This sheet is a summary. It may not cover all possible information. If you have questions about this medicine, talk to your doctor, pharmacist, or health care provider.  2025 Elsevier/Gold Standard (2024-01-01 00:00:00)

## 2024-11-04 ENCOUNTER — Inpatient Hospital Stay: Attending: Hematology & Oncology

## 2024-11-04 ENCOUNTER — Inpatient Hospital Stay

## 2024-11-04 ENCOUNTER — Inpatient Hospital Stay: Admitting: Hematology & Oncology

## 2024-11-12 ENCOUNTER — Other Ambulatory Visit: Payer: Self-pay

## 2024-11-13 ENCOUNTER — Other Ambulatory Visit: Payer: Self-pay

## 2024-11-13 DIAGNOSIS — C419 Malignant neoplasm of bone and articular cartilage, unspecified: Secondary | ICD-10-CM

## 2024-11-14 ENCOUNTER — Inpatient Hospital Stay

## 2024-11-14 ENCOUNTER — Inpatient Hospital Stay: Attending: Hematology & Oncology

## 2024-11-14 DIAGNOSIS — C419 Malignant neoplasm of bone and articular cartilage, unspecified: Secondary | ICD-10-CM

## 2024-11-14 DIAGNOSIS — C9000 Multiple myeloma not having achieved remission: Secondary | ICD-10-CM | POA: Insufficient documentation

## 2024-11-14 LAB — CBC WITH DIFFERENTIAL (CANCER CENTER ONLY)
Abs Immature Granulocytes: 0.01 K/uL (ref 0.00–0.07)
Basophils Absolute: 0 K/uL (ref 0.0–0.1)
Basophils Relative: 1 %
Eosinophils Absolute: 0.1 K/uL (ref 0.0–0.5)
Eosinophils Relative: 3 %
HCT: 31.8 % — ABNORMAL LOW (ref 39.0–52.0)
Hemoglobin: 10.2 g/dL — ABNORMAL LOW (ref 13.0–17.0)
Immature Granulocytes: 0 %
Lymphocytes Relative: 12 %
Lymphs Abs: 0.3 K/uL — ABNORMAL LOW (ref 0.7–4.0)
MCH: 32.6 pg (ref 26.0–34.0)
MCHC: 32.1 g/dL (ref 30.0–36.0)
MCV: 101.6 fL — ABNORMAL HIGH (ref 80.0–100.0)
Monocytes Absolute: 0.6 K/uL (ref 0.1–1.0)
Monocytes Relative: 21 %
Neutro Abs: 1.8 K/uL (ref 1.7–7.7)
Neutrophils Relative %: 63 %
Platelet Count: 53 K/uL — ABNORMAL LOW (ref 150–400)
RBC: 3.13 MIL/uL — ABNORMAL LOW (ref 4.22–5.81)
RDW: 14.6 % (ref 11.5–15.5)
WBC Count: 2.8 K/uL — ABNORMAL LOW (ref 4.0–10.5)
nRBC: 0 % (ref 0.0–0.2)

## 2024-11-14 NOTE — Progress Notes (Signed)
 No Udenyca  today, no transfusion today. Platelets 53,000, Hgb 10.2, and ANC 1.8. Pt given copy of labs.

## 2024-12-04 ENCOUNTER — Other Ambulatory Visit: Payer: Self-pay

## 2024-12-04 ENCOUNTER — Other Ambulatory Visit: Payer: Self-pay | Admitting: *Deleted

## 2024-12-04 MED ORDER — ONDANSETRON HCL 8 MG PO TABS: 8.0000 mg | ORAL_TABLET | Freq: Three times a day (TID) | ORAL | 2 refills | Status: AC | PRN

## 2024-12-05 ENCOUNTER — Other Ambulatory Visit: Payer: Self-pay | Admitting: *Deleted

## 2024-12-05 DIAGNOSIS — M79671 Pain in right foot: Secondary | ICD-10-CM

## 2024-12-05 DIAGNOSIS — R627 Adult failure to thrive: Secondary | ICD-10-CM

## 2024-12-05 DIAGNOSIS — C419 Malignant neoplasm of bone and articular cartilage, unspecified: Secondary | ICD-10-CM

## 2024-12-05 DIAGNOSIS — D801 Nonfamilial hypogammaglobulinemia: Secondary | ICD-10-CM

## 2024-12-05 DIAGNOSIS — M25512 Pain in left shoulder: Secondary | ICD-10-CM

## 2024-12-05 DIAGNOSIS — E440 Moderate protein-calorie malnutrition: Secondary | ICD-10-CM

## 2024-12-05 DIAGNOSIS — R52 Pain, unspecified: Secondary | ICD-10-CM

## 2024-12-05 DIAGNOSIS — C9000 Multiple myeloma not having achieved remission: Secondary | ICD-10-CM

## 2024-12-05 DIAGNOSIS — C9001 Multiple myeloma in remission: Secondary | ICD-10-CM

## 2024-12-05 DIAGNOSIS — B59 Pneumocystosis: Secondary | ICD-10-CM

## 2024-12-06 ENCOUNTER — Inpatient Hospital Stay: Attending: Hematology & Oncology

## 2024-12-06 ENCOUNTER — Telehealth: Payer: Self-pay | Admitting: *Deleted

## 2024-12-06 DIAGNOSIS — M79671 Pain in right foot: Secondary | ICD-10-CM

## 2024-12-06 DIAGNOSIS — C9001 Multiple myeloma in remission: Secondary | ICD-10-CM

## 2024-12-06 DIAGNOSIS — D801 Nonfamilial hypogammaglobulinemia: Secondary | ICD-10-CM

## 2024-12-06 DIAGNOSIS — M25512 Pain in left shoulder: Secondary | ICD-10-CM

## 2024-12-06 DIAGNOSIS — R627 Adult failure to thrive: Secondary | ICD-10-CM

## 2024-12-06 DIAGNOSIS — C9000 Multiple myeloma not having achieved remission: Secondary | ICD-10-CM

## 2024-12-06 DIAGNOSIS — E440 Moderate protein-calorie malnutrition: Secondary | ICD-10-CM

## 2024-12-06 DIAGNOSIS — C419 Malignant neoplasm of bone and articular cartilage, unspecified: Secondary | ICD-10-CM

## 2024-12-06 DIAGNOSIS — B59 Pneumocystosis: Secondary | ICD-10-CM

## 2024-12-06 DIAGNOSIS — R52 Pain, unspecified: Secondary | ICD-10-CM

## 2024-12-06 LAB — CBC WITH DIFFERENTIAL (CANCER CENTER ONLY)
Abs Immature Granulocytes: 0.01 10*3/uL (ref 0.00–0.07)
Basophils Absolute: 0 10*3/uL (ref 0.0–0.1)
Basophils Relative: 1 %
Eosinophils Absolute: 0 10*3/uL (ref 0.0–0.5)
Eosinophils Relative: 1 %
HCT: 33.3 % — ABNORMAL LOW (ref 39.0–52.0)
Hemoglobin: 11.1 g/dL — ABNORMAL LOW (ref 13.0–17.0)
Immature Granulocytes: 0 %
Lymphocytes Relative: 13 %
Lymphs Abs: 0.4 10*3/uL — ABNORMAL LOW (ref 0.7–4.0)
MCH: 32.8 pg (ref 26.0–34.0)
MCHC: 33.3 g/dL (ref 30.0–36.0)
MCV: 98.5 fL (ref 80.0–100.0)
Monocytes Absolute: 0.5 10*3/uL (ref 0.1–1.0)
Monocytes Relative: 18 %
Neutro Abs: 1.8 10*3/uL (ref 1.7–7.7)
Neutrophils Relative %: 67 %
Platelet Count: 63 10*3/uL — ABNORMAL LOW (ref 150–400)
RBC: 3.38 MIL/uL — ABNORMAL LOW (ref 4.22–5.81)
RDW: 15.3 % (ref 11.5–15.5)
WBC Count: 2.7 10*3/uL — ABNORMAL LOW (ref 4.0–10.5)
nRBC: 0 % (ref 0.0–0.2)

## 2024-12-06 LAB — CMP (CANCER CENTER ONLY)
ALT: 59 U/L — ABNORMAL HIGH (ref 0–44)
AST: 37 U/L (ref 15–41)
Albumin: 4.1 g/dL (ref 3.5–5.0)
Alkaline Phosphatase: 127 U/L — ABNORMAL HIGH (ref 38–126)
Anion gap: 12 (ref 5–15)
BUN: 19 mg/dL (ref 8–23)
CO2: 26 mmol/L (ref 22–32)
Calcium: 9.4 mg/dL (ref 8.9–10.3)
Chloride: 103 mmol/L (ref 98–111)
Creatinine: 1.24 mg/dL (ref 0.61–1.24)
GFR, Estimated: >60 mL/min
Glucose, Bld: 72 mg/dL (ref 70–99)
Potassium: 4.7 mmol/L (ref 3.5–5.1)
Sodium: 141 mmol/L (ref 135–145)
Total Bilirubin: 0.3 mg/dL (ref 0.0–1.2)
Total Protein: 6.2 g/dL — ABNORMAL LOW (ref 6.5–8.1)

## 2024-12-06 LAB — LACTATE DEHYDROGENASE: LDH: 165 U/L (ref 105–235)

## 2024-12-06 LAB — MAGNESIUM: Magnesium: 2.1 mg/dL (ref 1.7–2.4)

## 2024-12-06 NOTE — Telephone Encounter (Signed)
 Unable to contact pt  via phone. MyChart message sent to pt with response to lab results.
